# Patient Record
Sex: Male | Born: 1955 | Race: White | Hispanic: No | Smoking: Former smoker
Health system: Southern US, Community
[De-identification: ages and names within clinical notes are randomized; demographics above are authoritative.]

## PROBLEM LIST (undated history)

## (undated) DIAGNOSIS — J189 Pneumonia, unspecified organism: Secondary | ICD-10-CM

## (undated) DIAGNOSIS — R519 Headache, unspecified: Secondary | ICD-10-CM

## (undated) DIAGNOSIS — E785 Hyperlipidemia, unspecified: Secondary | ICD-10-CM

## (undated) DIAGNOSIS — N200 Calculus of kidney: Secondary | ICD-10-CM

## (undated) DIAGNOSIS — Z86718 Personal history of other venous thrombosis and embolism: Secondary | ICD-10-CM

## (undated) DIAGNOSIS — L97509 Non-pressure chronic ulcer of other part of unspecified foot with unspecified severity: Secondary | ICD-10-CM

## (undated) DIAGNOSIS — D649 Anemia, unspecified: Secondary | ICD-10-CM

## (undated) DIAGNOSIS — F32A Depression, unspecified: Secondary | ICD-10-CM

## (undated) DIAGNOSIS — G8929 Other chronic pain: Secondary | ICD-10-CM

## (undated) DIAGNOSIS — J45909 Unspecified asthma, uncomplicated: Secondary | ICD-10-CM

## (undated) DIAGNOSIS — G629 Polyneuropathy, unspecified: Secondary | ICD-10-CM

## (undated) DIAGNOSIS — Z9981 Dependence on supplemental oxygen: Secondary | ICD-10-CM

## (undated) DIAGNOSIS — K219 Gastro-esophageal reflux disease without esophagitis: Secondary | ICD-10-CM

## (undated) DIAGNOSIS — I10 Essential (primary) hypertension: Secondary | ICD-10-CM

## (undated) DIAGNOSIS — M109 Gout, unspecified: Secondary | ICD-10-CM

## (undated) DIAGNOSIS — C801 Malignant (primary) neoplasm, unspecified: Secondary | ICD-10-CM

## (undated) DIAGNOSIS — G4730 Sleep apnea, unspecified: Secondary | ICD-10-CM

## (undated) DIAGNOSIS — F329 Major depressive disorder, single episode, unspecified: Secondary | ICD-10-CM

## (undated) DIAGNOSIS — M549 Dorsalgia, unspecified: Secondary | ICD-10-CM

## (undated) DIAGNOSIS — E119 Type 2 diabetes mellitus without complications: Secondary | ICD-10-CM

## (undated) DIAGNOSIS — R0600 Dyspnea, unspecified: Secondary | ICD-10-CM

## (undated) DIAGNOSIS — M1990 Unspecified osteoarthritis, unspecified site: Secondary | ICD-10-CM

## (undated) DIAGNOSIS — J449 Chronic obstructive pulmonary disease, unspecified: Secondary | ICD-10-CM

## (undated) DIAGNOSIS — Z87442 Personal history of urinary calculi: Secondary | ICD-10-CM

## (undated) DIAGNOSIS — R51 Headache: Secondary | ICD-10-CM

## (undated) DIAGNOSIS — F419 Anxiety disorder, unspecified: Secondary | ICD-10-CM

## (undated) HISTORY — PX: DG FEET 2 VIEWS BILAT: HXRAD267

## (undated) HISTORY — PX: APPENDECTOMY: SHX54

## (undated) HISTORY — PX: OTHER SURGICAL HISTORY: SHX169

---

## 2004-10-29 DIAGNOSIS — Z729 Problem related to lifestyle, unspecified: Secondary | ICD-10-CM | POA: Insufficient documentation

## 2006-01-04 ENCOUNTER — Emergency Department: Payer: Self-pay | Admitting: Emergency Medicine

## 2006-01-04 ENCOUNTER — Other Ambulatory Visit: Payer: Self-pay

## 2006-01-07 ENCOUNTER — Other Ambulatory Visit: Payer: Self-pay

## 2006-01-07 ENCOUNTER — Emergency Department: Payer: Self-pay | Admitting: Emergency Medicine

## 2006-02-13 ENCOUNTER — Other Ambulatory Visit: Payer: Self-pay

## 2006-02-13 ENCOUNTER — Emergency Department: Payer: Self-pay | Admitting: Emergency Medicine

## 2006-04-04 ENCOUNTER — Ambulatory Visit: Payer: Self-pay | Admitting: Podiatry

## 2006-09-19 ENCOUNTER — Encounter: Admission: RE | Admit: 2006-09-19 | Discharge: 2006-09-19 | Payer: Self-pay | Admitting: Orthopedic Surgery

## 2006-11-18 ENCOUNTER — Inpatient Hospital Stay (HOSPITAL_COMMUNITY): Admission: RE | Admit: 2006-11-18 | Discharge: 2006-11-19 | Payer: Self-pay | Admitting: Orthopedic Surgery

## 2007-02-04 ENCOUNTER — Encounter: Admission: RE | Admit: 2007-02-04 | Discharge: 2007-02-04 | Payer: Self-pay | Admitting: Orthopedic Surgery

## 2008-01-12 ENCOUNTER — Emergency Department: Payer: Self-pay | Admitting: Emergency Medicine

## 2009-02-22 ENCOUNTER — Emergency Department (HOSPITAL_COMMUNITY): Admission: EM | Admit: 2009-02-22 | Discharge: 2009-02-22 | Payer: Self-pay | Admitting: Emergency Medicine

## 2009-08-04 ENCOUNTER — Emergency Department: Payer: Self-pay | Admitting: Emergency Medicine

## 2009-09-16 ENCOUNTER — Emergency Department: Payer: Self-pay | Admitting: Unknown Physician Specialty

## 2010-07-10 DIAGNOSIS — M109 Gout, unspecified: Secondary | ICD-10-CM | POA: Insufficient documentation

## 2010-11-23 DIAGNOSIS — M545 Low back pain, unspecified: Secondary | ICD-10-CM | POA: Insufficient documentation

## 2011-04-26 NOTE — Op Note (Signed)
Christian Fields, Christian Fields NO.:  [id number]   MEDICAL RECORD NO.:  [id number]          PATIENT TYPE:  INP   LOCATION:  2899                         FACILITY:  [facility]   PHYSICIAN:  Doralee Albino. Carola Frost, M.D. DATE OF BIRTH:  11/27/1956   DATE OF PROCEDURE:  11/18/2006  DATE OF DISCHARGE:                               OPERATIVE REPORT   DIAGNOSIS:  Right talus nonunion.   POSTOPERATIVE DIAGNOSIS:  Right talus nonunion.   PROCEDURE:  1. Open treatment of right talus fracture nonunion  2. Iliac crest autografting.   SURGEON:  Myrene Galas, MD   ANESTHESIA:  General.   SPECIMENS:  None.   ESTIMATED BLOOD LOSS:  100 mL.   COMPLICATIONS:  None.   DISPOSITION:  To PACU.   CONDITION:  Stable.   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  With have discussed the risks and benefits of surgery  including possibility of infection, failure of this nonunion repair,  need for further surgery including fusion, DVT, PE, and other concerns.  We also discussed complications related to iliac crest bone grafting  including pain, lateral leg numbness, and other concerns.  After a full  discussion, the patient wished to proceed.   DESCRIPTION OF PROCEDURE:  Christian Fields was taken to the operating room  after administration of preoperative antibiotics.  General anesthesia  was induced.  His right lower extremity was then prepped, draped in the  usual sterile fashion with a tourniquet about the thigh; however, it was  not inflated during the case.  Separately, his right iliac crest was  prepped and draped in the usual sterile fashion with a bump under the  right pelvic ring.  A 5 cm incision centered over the proximal aspect of  the medial malleolus was then made, the saphenous vein retracted  anteriorly, periosteum exposed, and a K-wire used to determine the exact  site of osteotomy.  This was then made with the oscillating saw using a  Chevron-type of bone cut.  Prior to making the cut, we did go ahead and  drill 2 screw holes for placement of the medial malleolar screws.  The  subchondral bone was then cracked with an osteotome to complete the  osteotomy.  We were then able to visualize the articular surface quite  clearly and identify the fracture site.  It was developed with a 15  blade being careful to limit injury to the articular surface.  Once  entering the fracture site, there was a large cavity directly under the  scar cartilage in the medial dome.  This area was curetted back to  bleeding, healthy bone.  We then continued to trace out the fracture  site working with small osteotomes to clear the area of nonunion.  Once  we had traced all this out, there were some small spicules communicating  from the anterior to posterior body which appeared to have united.  These were small.  The plan was  to take them down and obtain better  compression across the subtalar surface.  However, these were quite  sizable and after further debridement, there remained absolutely no  motion at the fracture site given that his contour and overall alignment  was good.  There was some risk in mobilizing these fractures entirely.  Furthermore, the nonunion site was somewhat serpentine and even with a  debridement, there would be large cavities requiring iliac crest bone  grafting.  Consequently, we accepted what union was there and chose  instead to use the iliac crest to pack the all the voids.   Attention was then turned to the iliac crest where a 4 cm incision was  made just superior to the wing.  We then stayed in the avascular plane  down to the crest and elevated the periosteum with the Bovie, made a  trap door, and then collected large quantity of iliac crest using large  strips.  This was then taken down to the ankle where the nonunion site  was irrigated  thoroughly and a small 1.5 drill used to make several  passes into both the anterior body as well as the posterior talar body.  We then packed the bone graft being careful to limit extravasation on  the subtalar side.  This resulted in an outstanding fill of the defect  with no loose fragments on the tibiotalar side.  The joint was then  irrigated and examined for debris and then the medial malleolar  osteotomy replaced, fixing it with two 40 mm partially-threaded 3.5  screws.  We then flipped down the trapdoor at the iliac crest.  We did  place some Gelfoam, and this was removed prior to closure.  I irrigated  this and closed it in standard layered fashion with 0 Vicryl for the  periosteal layer and deep layer and a 2-0 Vicryl and 3-0 nylon for the  skin.  Similarly distally, we performed a layered closure with 0 Vicryl  for the periosteum, 2-0 for the subcu, and 3-0 nylon for the skin.  Sterile gently compressive dressings were applied and then a posterior  and stirrup splint.  The patient was taken to the PACU in stable  condition.   PROGNOSIS:  Christian Fields had sufficient bone stability, did not require an  internal fixation which certainly will have benefit later in terms of  fusion should other procedures be required.  However, it appears at this  point, he is able to maintain smoking cessation and his weightbearing  restrictions that he should go on to unite this fracture.  Plan will be  to place him on Lovenox for DVT prophylaxis while in the hospital,  discharge him on baby aspirin daily, and then allow him unrestricted  flexion and extension within a week of surgery while, of course,  maintaining non-weightbearing.      Doralee Albino. Carola Frost, M.D.  Electronically Signed     [facility]/MEDQ  D:  11/18/2006  T:  11/18/2006  Job:  [id number]

## 2011-06-26 ENCOUNTER — Ambulatory Visit: Payer: Self-pay | Admitting: Pain Medicine

## 2012-06-28 ENCOUNTER — Ambulatory Visit: Payer: Self-pay | Admitting: Internal Medicine

## 2012-07-04 ENCOUNTER — Emergency Department: Payer: Self-pay | Admitting: Emergency Medicine

## 2012-10-02 ENCOUNTER — Ambulatory Visit: Payer: Self-pay

## 2012-10-21 ENCOUNTER — Ambulatory Visit: Payer: Self-pay | Admitting: Family Medicine

## 2012-12-15 ENCOUNTER — Encounter: Payer: Self-pay | Admitting: Anesthesiology

## 2013-01-09 ENCOUNTER — Encounter: Payer: Self-pay | Admitting: Anesthesiology

## 2013-01-18 DIAGNOSIS — F329 Major depressive disorder, single episode, unspecified: Secondary | ICD-10-CM | POA: Insufficient documentation

## 2013-01-18 DIAGNOSIS — G609 Hereditary and idiopathic neuropathy, unspecified: Secondary | ICD-10-CM | POA: Insufficient documentation

## 2013-01-18 DIAGNOSIS — G4730 Sleep apnea, unspecified: Secondary | ICD-10-CM | POA: Insufficient documentation

## 2013-01-18 DIAGNOSIS — J449 Chronic obstructive pulmonary disease, unspecified: Secondary | ICD-10-CM | POA: Insufficient documentation

## 2013-03-24 ENCOUNTER — Emergency Department: Payer: Self-pay | Admitting: Emergency Medicine

## 2013-03-24 ENCOUNTER — Ambulatory Visit: Payer: Self-pay | Admitting: Family Medicine

## 2013-03-24 LAB — CBC
HCT: 44.9 % (ref 40.0–52.0)
HGB: 15.5 g/dL (ref 13.0–18.0)
MCH: 32.8 pg (ref 26.0–34.0)
MCHC: 34.6 g/dL (ref 32.0–36.0)
MCV: 95 fL (ref 80–100)
Platelet: 271 10*3/uL (ref 150–440)
RBC: 4.73 10*6/uL (ref 4.40–5.90)
RDW: 13.2 % (ref 11.5–14.5)
WBC: 14.1 10*3/uL — ABNORMAL HIGH (ref 3.8–10.6)

## 2013-03-24 LAB — COMPREHENSIVE METABOLIC PANEL
Albumin: 3.7 g/dL (ref 3.4–5.0)
Alkaline Phosphatase: 143 U/L — ABNORMAL HIGH (ref 50–136)
Anion Gap: 8 (ref 7–16)
BUN: 19 mg/dL — ABNORMAL HIGH (ref 7–18)
Bilirubin,Total: 0.4 mg/dL (ref 0.2–1.0)
Calcium, Total: 9.3 mg/dL (ref 8.5–10.1)
Chloride: 100 mmol/L (ref 98–107)
Co2: 28 mmol/L (ref 21–32)
Creatinine: 0.79 mg/dL (ref 0.60–1.30)
EGFR (African American): >60
EGFR (Non-African Amer.): >60
Glucose: 113 mg/dL — ABNORMAL HIGH (ref 65–99)
Osmolality: 275 (ref 275–301)
Potassium: 3.2 mmol/L — ABNORMAL LOW (ref 3.5–5.1)
SGOT(AST): 18 U/L (ref 15–37)
SGPT (ALT): 27 U/L (ref 12–78)
Sodium: 136 mmol/L (ref 136–145)
Total Protein: 7.3 g/dL (ref 6.4–8.2)

## 2013-03-24 LAB — URINALYSIS, COMPLETE
Bacteria: NEGATIVE
Bacteria: NONE SEEN
Bilirubin,UR: NEGATIVE
Bilirubin,UR: NEGATIVE
Blood: NEGATIVE
Blood: NEGATIVE
Glucose,UR: NEGATIVE mg/dL (ref 0–75)
Glucose,UR: NEGATIVE mg/dL (ref 0–75)
Ketone: NEGATIVE
Ketone: NEGATIVE
Leukocyte Esterase: NEGATIVE
Leukocyte Esterase: NEGATIVE
Nitrite: NEGATIVE
Nitrite: NEGATIVE
Ph: 6 (ref 4.5–8.0)
Ph: 5 (ref 4.5–8.0)
Protein: NEGATIVE
Protein: NEGATIVE
RBC,UR: <1 /HPF (ref 0–5)
Specific Gravity: 1.02 (ref 1.003–1.030)
Specific Gravity: 1.02 (ref 1.003–1.030)
Squamous Epithelial: NONE SEEN
WBC UR: 1 /HPF (ref 0–5)

## 2013-03-24 LAB — TROPONIN I: Troponin-I: <0.02 ng/mL

## 2013-04-09 ENCOUNTER — Ambulatory Visit: Payer: Self-pay | Admitting: Family Medicine

## 2013-09-24 ENCOUNTER — Inpatient Hospital Stay: Payer: Self-pay | Admitting: Internal Medicine

## 2013-09-24 LAB — COMPREHENSIVE METABOLIC PANEL
Albumin: 3.6 g/dL (ref 3.4–5.0)
Alkaline Phosphatase: 128 U/L (ref 50–136)
Anion Gap: 8 (ref 7–16)
BUN: 10 mg/dL (ref 7–18)
Bilirubin,Total: 0.4 mg/dL (ref 0.2–1.0)
Calcium, Total: 9.3 mg/dL (ref 8.5–10.1)
Chloride: 97 mmol/L — ABNORMAL LOW (ref 98–107)
Co2: 30 mmol/L (ref 21–32)
Creatinine: 0.73 mg/dL (ref 0.60–1.30)
EGFR (African American): >60
EGFR (Non-African Amer.): >60
Glucose: 164 mg/dL — ABNORMAL HIGH (ref 65–99)
Osmolality: 273 (ref 275–301)
Potassium: 3 mmol/L — ABNORMAL LOW (ref 3.5–5.1)
SGOT(AST): 38 U/L — ABNORMAL HIGH (ref 15–37)
SGPT (ALT): 36 U/L (ref 12–78)
Sodium: 135 mmol/L — ABNORMAL LOW (ref 136–145)
Total Protein: 7.1 g/dL (ref 6.4–8.2)

## 2013-09-24 LAB — CBC
HCT: 42.5 % (ref 40.0–52.0)
HGB: 14.5 g/dL (ref 13.0–18.0)
MCH: 32.1 pg (ref 26.0–34.0)
MCHC: 34.2 g/dL (ref 32.0–36.0)
MCV: 94 fL (ref 80–100)
Platelet: 234 10*3/uL (ref 150–440)
RBC: 4.54 10*6/uL (ref 4.40–5.90)
RDW: 13.4 % (ref 11.5–14.5)
WBC: 8.8 10*3/uL (ref 3.8–10.6)

## 2013-09-24 LAB — PROTIME-INR
INR: 1
Prothrombin Time: 13 secs (ref 11.5–14.7)

## 2013-09-24 LAB — CK TOTAL AND CKMB (NOT AT ARMC)
CK, Total: 179 U/L (ref 35–232)
CK-MB: 4.4 ng/mL — ABNORMAL HIGH (ref 0.5–3.6)

## 2013-09-24 LAB — TROPONIN I: Troponin-I: <0.02 ng/mL

## 2013-09-24 LAB — APTT: Activated PTT: 30.6 secs (ref 23.6–35.9)

## 2013-09-25 LAB — LIPID PANEL
Cholesterol: 223 mg/dL — ABNORMAL HIGH (ref 0–200)
HDL Cholesterol: 57 mg/dL (ref 40–60)
Ldl Cholesterol, Calc: 146 mg/dL — ABNORMAL HIGH (ref 0–100)
Triglycerides: 99 mg/dL (ref 0–200)
VLDL Cholesterol, Calc: 20 mg/dL (ref 5–40)

## 2013-09-25 LAB — CBC WITH DIFFERENTIAL/PLATELET
Basophil #: 0 10*3/uL (ref 0.0–0.1)
Basophil %: 0.4 %
Eosinophil #: 0 10*3/uL (ref 0.0–0.7)
Eosinophil %: 0.1 %
HCT: 44.5 % (ref 40.0–52.0)
HGB: 15.1 g/dL (ref 13.0–18.0)
Lymphocyte #: 0.6 10*3/uL — ABNORMAL LOW (ref 1.0–3.6)
Lymphocyte %: 6.7 %
MCH: 32.1 pg (ref 26.0–34.0)
MCHC: 33.9 g/dL (ref 32.0–36.0)
MCV: 95 fL (ref 80–100)
Monocyte #: 0.1 x10 3/mm — ABNORMAL LOW (ref 0.2–1.0)
Monocyte %: 1 %
Neutrophil #: 8.9 10*3/uL — ABNORMAL HIGH (ref 1.4–6.5)
Neutrophil %: 91.8 %
Platelet: 241 10*3/uL (ref 150–440)
RBC: 4.69 10*6/uL (ref 4.40–5.90)
RDW: 13 % (ref 11.5–14.5)
WBC: 9.7 10*3/uL (ref 3.8–10.6)

## 2013-09-25 LAB — CK TOTAL AND CKMB (NOT AT ARMC)
CK, Total: 113 U/L (ref 35–232)
CK, Total: 76 U/L (ref 35–232)
CK-MB: 3.1 ng/mL (ref 0.5–3.6)
CK-MB: 2.3 ng/mL (ref 0.5–3.6)

## 2013-09-25 LAB — BASIC METABOLIC PANEL
Anion Gap: 5 — ABNORMAL LOW (ref 7–16)
BUN: 12 mg/dL (ref 7–18)
Calcium, Total: 9.7 mg/dL (ref 8.5–10.1)
Chloride: 97 mmol/L — ABNORMAL LOW (ref 98–107)
Co2: 35 mmol/L — ABNORMAL HIGH (ref 21–32)
Creatinine: 0.86 mg/dL (ref 0.60–1.30)
EGFR (African American): >60
EGFR (Non-African Amer.): >60
Glucose: 178 mg/dL — ABNORMAL HIGH (ref 65–99)
Osmolality: 278 (ref 275–301)
Potassium: 3.5 mmol/L (ref 3.5–5.1)
Sodium: 137 mmol/L (ref 136–145)

## 2013-09-25 LAB — TROPONIN I
Troponin-I: <0.02 ng/mL
Troponin-I: <0.02 ng/mL

## 2013-09-25 LAB — HEMOGLOBIN A1C: Hemoglobin A1C: 6.3 % (ref 4.2–6.3)

## 2014-03-08 ENCOUNTER — Ambulatory Visit: Payer: Self-pay | Admitting: Gastroenterology

## 2014-03-10 LAB — PATHOLOGY REPORT

## 2014-03-29 ENCOUNTER — Ambulatory Visit: Payer: Self-pay | Admitting: General Practice

## 2014-03-29 ENCOUNTER — Ambulatory Visit: Payer: Self-pay | Admitting: Emergency Medicine

## 2014-03-31 ENCOUNTER — Emergency Department: Payer: Self-pay | Admitting: Emergency Medicine

## 2014-03-31 LAB — BASIC METABOLIC PANEL
Anion Gap: 6 — ABNORMAL LOW (ref 7–16)
BUN: 13 mg/dL (ref 7–18)
Calcium, Total: 9.3 mg/dL (ref 8.5–10.1)
Chloride: 100 mmol/L (ref 98–107)
Co2: 30 mmol/L (ref 21–32)
Creatinine: 1.05 mg/dL (ref 0.60–1.30)
EGFR (African American): >60
EGFR (Non-African Amer.): >60
Glucose: 180 mg/dL — ABNORMAL HIGH (ref 65–99)
Osmolality: 277 (ref 275–301)
Potassium: 3.7 mmol/L (ref 3.5–5.1)
Sodium: 136 mmol/L (ref 136–145)

## 2014-03-31 LAB — CBC
HCT: 44.6 % (ref 40.0–52.0)
HGB: 14.3 g/dL (ref 13.0–18.0)
MCH: 30.7 pg (ref 26.0–34.0)
MCHC: 32.1 g/dL (ref 32.0–36.0)
MCV: 96 fL (ref 80–100)
Platelet: 261 10*3/uL (ref 150–440)
RBC: 4.66 10*6/uL (ref 4.40–5.90)
RDW: 13.7 % (ref 11.5–14.5)
WBC: 9.1 10*3/uL (ref 3.8–10.6)

## 2014-05-05 ENCOUNTER — Ambulatory Visit: Payer: Self-pay | Admitting: Emergency Medicine

## 2014-05-11 ENCOUNTER — Emergency Department: Payer: Self-pay | Admitting: Emergency Medicine

## 2014-05-17 ENCOUNTER — Ambulatory Visit: Payer: Self-pay | Admitting: Podiatry

## 2014-05-17 LAB — URIC ACID: Uric Acid: 4.4 mg/dL (ref 3.5–7.2)

## 2014-05-17 LAB — HEMOGLOBIN: HGB: 13.9 g/dL (ref 13.0–18.0)

## 2014-05-17 LAB — POTASSIUM: Potassium: 3.5 mmol/L (ref 3.5–5.1)

## 2014-05-17 LAB — GLUCOSE, RANDOM: Glucose: 91 mg/dL (ref 65–99)

## 2014-05-19 ENCOUNTER — Ambulatory Visit: Payer: Self-pay | Admitting: Podiatry

## 2015-03-31 NOTE — H&P (Signed)
PATIENT NAME:  Christian Fields, Christian Fields MR#:  [id number] DATE OF BIRTH:  03-08-1956  REFERRING PHYSICIAN: Lenise Arena, MD.  PRIMARY CARE PHYSICIAN: Dr. Clide Deutscher.   CHIEF COMPLAINT: Chest pain.   HISTORY OF PRESENT ILLNESS: The patient is a 59 year old Caucasian male with history of COPD, on nocturnal oxygen, hypertension, anxiety, and neuropathy. He came in for chest pain. He stated that the chest pain started today, midsternal, does not radiate, but the patient did have some nausea, diaphoresis and shortness of breath with this. EMS was called and he did receive some Zofran as well as an aspirin. He does have history of COPD as well but is a current smoker. Currently he has no chest pain. The pain has resolved.   On arrival he was noted to have hypoxemia with an O2 saturation of 85% on room air. Of note, he is not on oxygen during the day. He does not have any cough. He did have mild elevation of CK-MB but negative troponin, and hospitalist services were contacted for further evaluation and management.   PAST MEDICAL HISTORY: History of hypertension, COPD, hyperlipidemia, anxiety, neuropathy, chronic back pain, chronic respiratory failure with oxygen nocturnally.   PAST SURGICAL HISTORY: Denies.   FAMILY HISTORY: Hypertension in the mother.   SOCIAL HISTORY: Still smokes 1-1/2 packs a day. Occasional alcohol. No drug use. 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   OUTPATIENT MEDICATIONS: Gabapentin 600 mg three times a day, aspirin 81 mg daily, simvastatin 40 mg daily, Advair 250/50 inhaled one puff 2 times a day, Spiriva 18 mcg inhaled daily, duloxetine 30 mg daily.   REVIEW OF SYSTEMS: CONSTITUTIONAL: No fever, fatigue or weakness. No weight changes.  EYES: No blurry vision or double vision.  ENT: No tinnitus or hearing loss. No snoring.  RESPIRATORY: Denies cough. Has shortness of breath and nocturnal wheezing as well. He has some wheezing today and some shortness of breath. No painful respirations.   CARDIOVASCULAR: Chest pain as above. No orthopnea or swelling in the legs. No  arrhythmia. Has hypertension. No history of MI or CHF.  GASTROINTESTINAL: Did not have nausea prior. No nausea or vomiting. No abdominal pain now. No hematemesis.  GENITOURINARY: Denies dysuria, hematuria.  HEMOLYMPHATIC: No anemia or easy bruising.  SKIN: No rashes.  MUSCULOSKELETAL: Denies gout. Has chronic back pain.  NEUROLOGIC: Denies focal weakness or numbness.  PSYCHIATRIC: Has anxiety and depression.   PHYSICAL EXAMINATION:  VITAL SIGNS: Temperature on arrival 98, pulse rate 106, respiratory rate 20, blood pressure 98/62. Oxygen saturation was 85% on room air. Currently he is on oxygen and O2 saturations are in the 90's.  GENERAL: Unkempt Caucasian male sitting in bed, talking in full sentences, but he is falling asleep when I am asking him questions and during the exam, and he is arousable though. HEENT: Normocephalic, atraumatic. Pupils are equal and reactive. Anicteric sclerae. Extraocular muscles intact. Moist mucous membranes.  NECK: Supple. No thyroid tenderness. No cervical lymphadenopathy.  CARDIOVASCULAR: S1, S2. Regular rate and rhythm. No significant murmurs appreciated.  LUNGS: Bilateral wheezing in all lung fields. No crackles.  ABDOMEN: Soft, nontender, nondistended. Positive bowel sounds in all quadrants. No organomegaly appreciated.  SKIN: No rashes.  EXTREMITIES: No significant lower extremity edema.  NEUROLOGIC: Cranial nerves II through XII grossly intact. Strength is five out of five in all extremities. Sensation is intact to light touch.  PSYCHIATRIC: Awake, alert, oriented x3.   LABORATORY AND IMAGING DATA: EKG shows sinus tach. Rate is 103. There are Q waves in V1,  V2, V3 which appear to be old. No acute ST elevations or depressions.   X-ray of the chest shows COPD and also hyperinflation.   BUN 10, creatinine 0.73, sodium 135, potassium 3, chloride 97. LFTs show AST of 38 but   otherwise within normal limits. CK-MB is 4.4. Troponin and CK total negative. CBC within normal limits. INR is 1.   ASSESSMENT AND PLAN: We have a 59 year old Caucasian male with history of chronic obstructive pulmonary disease on nocturnal oxygen, chronic respiratory failure, not on oxygen during the day, however, who comes in for pains in the chest with typical features, diaphoresis, nausea, however, without radiation. This is concerning for unstable angina. Furthermore, the patient does have acute-on-chronic respiratory failure likely secondary to chronic obstructive pulmonary disease and does smoke still. At this point, would admit the patient to the hospital.   In regards to the chest pain, the patient does have multiple risk factors. Is ongoing tobacco abuser. Has hypertension, hyperlipidemia, and at this point, unstable angina is a possibility. He has no pains in the chest now, however. Would admit to telemetry, cycle the troponins and CK-MB, start aspirin, statin, start a low-dose beta blocker as well. We would start Nitro patch and  morphine p.r.n. We will see how he does, obtain an echocardiogram, and given the respiratory issues, would defer a stress test until the patient is better, as he has no active chest pains now. We would consider a cardiology consult if the troponins turn up positive.   In regards to the acute-on-chronic respiratory failure, this is likely secondary to chronic obstructive pulmonary disease. We would start him on IV steroids, nebulizers around the clock, continue his Spiriva. We would also put him on Levaquin.   X-ray of the chest is negative for pneumonia. He was counseled for 3 minutes about smoking cessation. He states that he wants to quit and he is using Chantix as an outpatient.   He does have mild hypokalemia, which I will go ahead and replete. Continue his gabapentin at this point. We would follow him clinically. He has a somewhat low blood pressure at this point,  and I would go ahead and hold the hydrochlorothiazide. Would start him on a proton pump inhibitor for gastrointestinal prophylaxis and deep vein thrombosis prophylaxis as well.   TOTAL TIME SPENT: 50 minutes.   CODE STATUS: FULL CODE.    ____________________________ Vivien Presto, MD sa:np D: 09/24/2013 21:19:39 ET T: 09/24/2013 22:29:35 ET JOB#: [id number]  cc: Vivien Presto, MD, <Dictator> Vivien Presto MD ELECTRONICALLY SIGNED 10/28/2013 1:48

## 2015-03-31 NOTE — Discharge Summary (Signed)
PATIENT NAME:  Christian Fields, Christian Fields MR#:  [id number] DATE OF BIRTH:  25-Dec-1955  DATE OF ADMISSION:  09/24/2013 DATE OF DISCHARGE:  09/27/2013  ADMISSION DIAGNOSES:  1. Acute on chronic respiratory failure.  2. Chest pain.   DISCHARGE DIAGNOSES:  1. Acute on chronic respiratory failure due to COPD exacerbation  2. Chest pain secondary to problem #1.  3. Hyperlipidemia.   CONSULTATIONS: None.   NUCLEAR MEDICINE STRESS TEST: Negative for ischemia.   ECHOCARDIOGRAM: Showed normal ejection fraction. He does have diastolic dysfunction with impaired LV relaxation.   HOSPITAL COURSE: This 59 year old male presented with acute on chronic respiratory failure. For further details, please refer to the H and P.  1. Acute on chronic respiratory failure secondary to acute COPD exacerbation. The patient was placed on IV steroids, Levaquin and oxygen, as well as inhalers. He actually quite well. He does require oxygen on exertion. He is saturating 85% due to his chronic respiratory failure. He has no crackles or wheezing at discharge. His respiratory failure has improved from admission.  2. Chest pain secondary to COPD exacerbation flare rather than true angina. His stress test was negative for acute ischemia. Troponins are negative. Telemetry is negative. His echo did show impaired relaxation consistent with diastolic dysfunction. He had a normal EF.   3. Tobacco abuse. The patient says he wants to quit smoking; however, he was found outside smoking. He is on Chantix, and we discussed in great detail about stopping smoking and not using Chantix while smoking.   DISCHARGE MEDICATIONS:  1. Simvastatin 40 mg at bedtime.  2. HCTZ 25 mg daily.  3. Advair Diskus 250/50 b.i.d.  4. Aspirin 81 mg daily.  5. Gabapentin 600 mg 2 times a day and 1-1/2 tablets at bedtime.  6. Clonazepam 1 mg t.i.d.  7. Spiriva 18 mcg daily.  8. Cetirizine 10 mg daily.  9. Duloxetine 30 mg daily.  10. Vitamin D3 1000 international  units daily.  11. Vitamin B12 500 mcg daily.  12. Ginseng 1 tablet daily.  13. Fluticasone nasal spray 2 sprays daily.  14. Pataday 0.2 ophthalmic solution to each affected eye daily.  15. Prednisone taper starting at 60 mg taper by 10 mg every 2 days.  16. Varenicline 1 mg daily.  17. Levaquin 500 mg q.24 hours for 3 days.   DISCHARGE OXYGEN: Two liters nasal cannula for COPD chronic.   DISCHARGE DIET: Low sodium, regular consistency.   DISCHARGE FOLLOWUP: The patient will follow up with Dr. Ubaldo Fields in 2 weeks, as well as Dr. Devona Konig. He will follow up with Dr. Clide Fields tomorrow.    TIME SPENT: Approximately 35 minutes.   The patient is medically stable for discharge.   ____________________________ Priscella Donna P. Benjie Karvonen, MD spm:gb D: 09/27/2013 16:30:24 ET T: 09/27/2013 19:55:41 ET JOB#: [id number]  cc: Christian Fantroy P. Benjie Karvonen, MD, <Dictator> Christian Gee, MD Christian Docker Ubaldo Glassing, MD Christian A. Clide Deutscher, MD   [name] MD ELECTRONICALLY SIGNED 09/27/2013 20:59

## 2015-04-01 NOTE — Op Note (Signed)
PATIENT NAME:  Christian Fields, Christian Fields MR#:  [id number] DATE OF BIRTH:  08-03-1956  DATE OF PROCEDURE:  05/19/2014  PREOPERATIVE DIAGNOSIS: Displaced navicular fracture, comminuted, left foot.   POSTOPERATIVE DIAGNOSIS: Displaced navicular fracture, comminuted, left foot.   PROCEDURE: Open reduction internal fixation navicular fracture, left foot.   SURGEON: Gerrit Heck. Caetano Oberhaus, DPM  ASSISTANT: None.   HISTORY OF PRESENT ILLNESS: The patient had an injury last week where he fell into a hole. It was noted that he had a comminuted navicular fracture. He has been somewhat noncompliant on it, has been walking around on it some, so it has displaced some more since time of his fracture.   ANESTHESIA: Popliteal block with sedation.   ANESTHESIOLOGIST: Dr. Roseanne Kaufman BLOOD LOSS: 75 mL.   HEMOSTASIS: Thigh tourniquet at 300 mmHg.   OPERATIVE REPORT: The patient was brought to the OR and placed on the OR table in the supine position. At this point, after general anesthesia was achieved, the patient was then prepped and draped in the usual sterile manner. Popliteal block had been achieved in the holding area earlier. At this time, attention was directed to the dorsum of the left foot where a 7 cm dorsal linear incision was made over the talonavicular and navicular cuneiform joint. This was deepened with sharp and blunt dissection, bleeders clamped and bovied as required. Vital structures were retracted medially and laterally. The tibialis anterior tendon was identified and retracted laterally and periosteum over the above joints were then incised in a longitudinal fashion. This was then freed away from the medial and dorsal navicular. There was noted fracture fragments and noted comminution to the navicular at this time frame. A separate piece was dorsal and comminuted fragments were present dorsomedially and lateral plantarly with multiple fracture lines. A separate incision was made lateral to the dorsalis  pedis region. This was deepened with sharp and blunt dissection, bleeders clamped and bovied as required. Vital structures were retracted medially and laterally. This was carried down to the area of the lateral navicular and periosteal tissue was freed away from this region until both incisions were joined and full observation of the navicular was achieved. Multiple comminuted portions were noted. At this point, the area was temporarily fixated with bone clamp and K wires. A plate from [employer] system and navicular plate was then utilized to mold around the region and a template was utilized initially in order to get the correct sizing and bend and then the plate was manipulated on the table. Once this was in position, a couple of compression screws were placed across the area once the plate was in ready location, 1 medially and 1 laterally. This was followed by multiple locking screws. The area was checked with FluoroScan and good position and correction was noted. There was a dorsal medial fragment, fairly large, that was problematic and intra-articular and a 2.7 screw was placed across this area for fixation. The area was then copiously irrigated along both wounds. Periosteal tissue was closed laterally with 3-0 Vicryl continuous sutures. Once this aspect of it was completed, the tourniquet was released and prompt and complete vascularity was seen to return to the digits of the right foot. No pulsatile flow was noted along either incision margin. The remaining lateral incision was closed, deep and superficial fascia layers and ligamentous tissues were all closed. The retinaculum was closed also with a continuous 3-0 Vicryl stitch.   At this time, the medial periosteum capsular tissue was closed with 3-0  Vicryl in a combination of simple interrupted and continuous stitch. Retinaculum were closed in a similar fashion as were deep and superficial fascia. Skin at this time to both the areas was closed with skin  staples. A drain was placed in the medial incision prior to closure. A sterile compressive dressing was placed across the wounds at this time consisting of Xeroform gauze, 4 x 4's, Kling, Kerlix and ABD pads. A posterior splint was placed on the left foot and leg in the operating room. The patient appeared to tolerate the procedure and anesthesia well and left the OR for the recovery room with vital signs stable and neurovascular status intact. ____________________________ Gerrit Heck. Hameed Kolar, DPM mgt:sb D: 05/19/2014 13:15:14 ET T: 05/19/2014 13:53:36 ET JOB#: [id number]  cc: Gerrit Heck Mayley Lish, DPM, <Dictator> Perry Mount MD ELECTRONICALLY SIGNED 05/26/2014 7:58

## 2015-08-24 ENCOUNTER — Ambulatory Visit
Admission: EM | Admit: 2015-08-24 | Discharge: 2015-08-24 | Disposition: A | Discharge status: Home / Self Care | Payer: Medicare Other | Attending: Family Medicine | Admitting: Family Medicine

## 2015-08-24 ENCOUNTER — Encounter: Payer: Self-pay | Admitting: Emergency Medicine

## 2015-08-24 DIAGNOSIS — J441 Chronic obstructive pulmonary disease with (acute) exacerbation: Secondary | ICD-10-CM

## 2015-08-24 DIAGNOSIS — Z720 Tobacco use: Secondary | ICD-10-CM | POA: Diagnosis not present

## 2015-08-24 DIAGNOSIS — H109 Unspecified conjunctivitis: Secondary | ICD-10-CM

## 2015-08-24 HISTORY — DX: Hyperlipidemia, unspecified: E78.5

## 2015-08-24 HISTORY — DX: Gout, unspecified: M10.9

## 2015-08-24 HISTORY — DX: Essential (primary) hypertension: I10

## 2015-08-24 MED ORDER — AMOXICILLIN-POT CLAVULANATE 875-125 MG PO TABS: 1.0000 | ORAL_TABLET | Freq: Two times a day (BID) | ORAL | Status: DC

## 2015-08-24 MED ORDER — PREDNISONE 10 MG (21) PO TBPK: ORAL_TABLET | ORAL | Status: DC

## 2015-08-24 NOTE — ED Provider Notes (Signed)
CSN: [id number]     Arrival date & time 08/24/15  1632 History   First MD Initiated Contact with Patient 08/24/15 1807     Chief Complaint  Patient presents with  . Eye Pain   patient's here because of left eye pain reports something he thought His eye but with further questioning he states that the feeling of something in his eyes comes and goes. This is been going on since 4 days. He does not know of anything that did get into his eye. His eye is irritated and red and because irritated and red he thinks is a male got into the eye.  After his eye was examined and HEENT was evaluated patient was told to plan to place him on anabiotic eyedrops and follow with ophthalmologist of choice things do not get better. As his discharge papers being written up patient informed the nurse if he got to tell me about his cough is congestion. Has a history of COPD and has been coughing up yellowish-green material since yesterday he still smokes despite being told not to smoke. Patient will also be evaluated now for exacerbation of COPD. Findings and history of both examinations will be emergent 1 note. (Consider location/radiation/quality/duration/timing/severity/associated sxs/prior Treatment) Patient is a 59 y.o. male presenting with eye pain. The history is provided by the patient. No language interpreter was used.  Eye Pain This is a new problem. Episode onset: Started Monday 4 days ago. The problem occurs constantly. Associated symptoms include shortness of breath. Pertinent negatives include no chest pain, no abdominal pain and no headaches. Nothing aggravates the symptoms. Nothing relieves the symptoms. He has tried a warm compress and water for the symptoms. The treatment provided no relief.    Past Medical History  Diagnosis Date  . Hypertension   . Hyperlipidemia   . Gout    Past Surgical History  Procedure Laterality Date  . Dg feet 2 views bilat    . Appendectomy     No family history on  file. Social History  Substance Use Topics  . Smoking status: Current Every Day Smoker  . Smokeless tobacco: Never Used  . Alcohol Use: Yes    Review of Systems  Eyes: Positive for pain.  Respiratory: Positive for shortness of breath and wheezing.   Cardiovascular: Negative for chest pain.  Gastrointestinal: Negative for abdominal pain.  Neurological: Negative for headaches.  All other systems reviewed and are negative.   Allergies  Review of patient's allergies indicates no known allergies.  Home Medications   Prior to Admission medications   Medication Sig Start Date End Date Taking? Authorizing Provider  amoxicillin-clavulanate (AUGMENTIN) 875-125 MG per tablet Take 1 tablet by mouth 2 (two) times daily. 08/24/15   Frederich Cha, MD  predniSONE (STERAPRED UNI-PAK 21 TAB) 10 MG (21) TBPK tablet Sig 6 tablet day 1, 5 tablets day 2, 4 tablets day 3,,3tablets day 4, 2 tablets day 5, 1 tablet day 6 take all tablets orally 08/24/15   Frederich Cha, MD   Meds Ordered and Administered this Visit  Medications - No data to display  BP 131/86 mmHg  Pulse 89  Temp(Src) 97 F (36.1 C) (Tympanic)  Resp 16  Ht '5\' 8"'$  (1.727 m)  Wt 162 lb (73.483 kg)  BMI 24.64 kg/m2  SpO2 90% No data found.   Physical Exam  Constitutional: He is oriented to person, place, and time.  Patient of is a chronically ill-appearing white male  HENT:  Head: Normocephalic and atraumatic.  Right Ear: External ear normal.  Left Ear: External ear normal.  Eyes: Pupils are equal, round, and reactive to light.  Neck: Neck supple.  Cardiovascular: Normal rate and regular rhythm.   Pulmonary/Chest: He has wheezes.  Musculoskeletal: Normal range of motion. He exhibits no edema or tenderness.  Neurological: He is alert and oriented to person, place, and time.  Skin: Skin is warm and dry.  Psychiatric: He has a normal mood and affect. His behavior is normal.  Vitals reviewed.   ED Course  Procedures (including  critical care time)  Labs Review Labs Reviewed - No data to display  Imaging Review No results found.   Visual Acuity Review  Right Eye Distance: 20/30 Left Eye Distance: 20/30 Bilateral Distance:    Right Eye Near:   Left Eye Near:    Bilateral Near:         MDM   1. Conjunctivitis, left eye   2. COPD with acute exacerbation   3. Tobacco abuse      We'll treat COPD with course of prednisone. Also add Augmentin 875 one tablet twice a day for the next 10 days. Patient course warned that he needs to stop smoking. Also because of your anabiotic's we'll place him on any eyedrops still with instructions if he is not better as far as his left eye is concerned he sees an ophthalmologist of his choice in the ED of his choice    Frederich Cha, MD 08/24/15 [phone]

## 2015-08-24 NOTE — ED Notes (Signed)
Left eye red and painful for 4 days

## 2015-08-24 NOTE — Discharge Instructions (Signed)
Bacterial Conjunctivitis Bacterial conjunctivitis (commonly called pink eye) is redness, soreness, or puffiness (inflammation) of the white part of your eye. It is caused by a germ called bacteria. These germs can easily spread from person to person (contagious). Your eye often will become red or pink. Your eye may also become irritated, watery, or have a thick discharge.  HOME CARE   Apply a cool, clean washcloth over closed eyelids. Do this for 10-20 minutes, 3-4 times a day while you have pain.  Gently wipe away any fluid coming from the eye with a warm, wet washcloth or cotton ball.  Wash your hands often with soap and water. Use paper towels to dry your hands.  Do not share towels or washcloths.  Change or wash your pillowcase every day.  Do not use eye makeup until the infection is gone.  Do not use machines or drive if your vision is blurry.  Stop using contact lenses. Do not use them again until your doctor says it is okay.  Do not touch the tip of the eye drop bottle or medicine tube with your fingers when you put medicine on the eye. GET HELP RIGHT AWAY IF:   Your eye is not better after 3 days of starting your medicine.  You have a yellowish fluid coming out of the eye.  You have more pain in the eye.  Your eye redness is spreading.  Your vision becomes blurry.  You have a fever or lasting symptoms for more than 2-3 days.  You have a fever and your symptoms suddenly get worse.  You have pain in the face.  Your face gets red or puffy (swollen). MAKE SURE YOU:   Understand these instructions.  Will watch this condition.  Will get help right away if you are not doing well or get worse. Document Released: 09/03/2008 Document Revised: 11/11/2012 Document Reviewed: 07/31/2012 [facility] Patient Information 2015 [place], [place]. This information is not intended to replace advice given to you by your health care provider. Make sure you discuss any questions you have  with your health care provider.  Chronic Obstructive Pulmonary Disease Exacerbation  Chronic obstructive pulmonary disease (COPD) is a common lung problem. In COPD, the flow of air from the lungs is limited. COPD exacerbations are times that breathing gets worse and you need extra treatment. Without treatment they can be life threatening. If they happen often, your lungs can become more damaged. HOME CARE  Do not smoke.  Avoid tobacco smoke and other things that bother your lungs.  If given, take your antibiotic medicine as told. Finish the medicine even if you start to feel better.  Only take medicines as told by your doctor.  Drink enough fluids to keep your pee (urine) clear or pale yellow (unless your doctor has told you not to).  Use a cool mist machine (vaporizer).  If you use oxygen or a machine that turns liquid medicine into a mist (nebulizer), continue to use them as told.  Keep up with shots (vaccinations) as told by your doctor.  Exercise regularly.  Eat healthy foods.  Keep all doctor visits as told. GET HELP RIGHT AWAY IF:  You are very short of breath and it gets worse.  You have trouble talking.  You have bad chest pain.  You have blood in your spit (sputum).  You have a fever.  You keep throwing up (vomiting).  You feel weak, or you pass out (faint).  You feel confused.  You keep getting  worse. MAKE SURE YOU:   Understand these instructions.  Will watch your condition.  Will get help right away if you are not doing well or get worse. Document Released: 11/14/2011 Document Revised: 09/15/2013 Document Reviewed: 07/30/2013 [facility] Patient Information 2015 [place], [place]. This information is not intended to replace advice given to you by your health care provider. Make sure you discuss any questions you have with your health care provider.  Conjunctivitis Conjunctivitis is commonly called "pink eye." Conjunctivitis can be caused by bacterial or  viral infection, allergies, or injuries. There is usually redness of the lining of the eye, itching, discomfort, and sometimes discharge. There may be deposits of matter along the eyelids. A viral infection usually causes a watery discharge, while a bacterial infection causes a yellowish, thick discharge. Pink eye is very contagious and spreads by direct contact. You may be given antibiotic eyedrops as part of your treatment. Before using your eye medicine, remove all drainage from the eye by washing gently with warm water and cotton balls. Continue to use the medication until you have awakened 2 mornings in a row without discharge from the eye. Do not rub your eye. This increases the irritation and helps spread infection. Use separate towels from other household members. Wash your hands with soap and water before and after touching your eyes. Use cold compresses to reduce pain and sunglasses to relieve irritation from light. Do not wear contact lenses or wear eye makeup until the infection is gone. SEEK MEDICAL CARE IF:   Your symptoms are not better after 3 days of treatment.  You have increased pain or trouble seeing.  The outer eyelids become very red or swollen. Document Released: 01/02/2005 Document Revised: 02/17/2012 Document Reviewed: 11/25/2005 [facility] Patient Information 2015 [place], [place]. This information is not intended to replace advice given to you by your health care provider. Make sure you discuss any questions you have with your health care provider.  You Can Quit Smoking If you are ready to quit smoking or are thinking about it, congratulations! You have chosen to help yourself be healthier and live longer! There are lots of different ways to quit smoking. Nicotine gum, nicotine patches, a nicotine inhaler, or nicotine nasal spray can help with physical craving. Hypnosis, support groups, and medicines help break the habit of smoking. TIPS TO GET OFF AND STAY OFF  CIGARETTES  Learn to predict your moods. Do not let a bad situation be your excuse to have a cigarette. Some situations in your life might tempt you to have a cigarette.  Ask friends and co-workers not to smoke around you.  Make your home smoke-free.  Never have "just one" cigarette. It leads to wanting another and another. Remind yourself of your decision to quit.  On a card, make a list of your reasons for not smoking. Read it at least the same number of times a day as you have a cigarette. Tell yourself everyday, "I do not want to smoke. I choose not to smoke."  Ask someone at home or work to help you with your plan to quit smoking.  Have something planned after you eat or have a cup of coffee. Take a walk or get other exercise to perk you up. This will help to keep you from overeating.  Try a relaxation exercise to calm you down and decrease your stress. Remember, you may be tense and nervous the first two weeks after you quit. This will pass.  Find new activities to keep your  hands busy. Play with a pen, coin, or rubber band. Doodle or draw things on paper.  Brush your teeth right after eating. This will help cut down the craving for the taste of tobacco after meals. You can try mouthwash too.  Try gum, breath mints, or diet candy to keep something in your mouth. IF YOU SMOKE AND WANT TO QUIT:  Do not stock up on cigarettes. Never buy a carton. Wait until one pack is finished before you buy another.  Never carry cigarettes with you at work or at home.  Keep cigarettes as far away from you as possible. Leave them with someone else.  Never carry matches or a lighter with you.  Ask yourself, "Do I need this cigarette or is this just a reflex?"  Bet with someone that you can quit. Put cigarette money in a piggy bank every morning. If you smoke, you give up the money. If you do not smoke, by the end of the week, you keep the money.  Keep trying. It takes 21 days to change a  habit!  Talk to your doctor about using medicines to help you quit. These include nicotine replacement gum, lozenges, or skin patches. Document Released: 09/21/2009 Document Revised: 02/17/2012 Document Reviewed: 09/21/2009 [facility] Patient Information 2015 [place], [place]. This information is not intended to replace advice given to you by your health care provider. Make sure you discuss any questions you have with your health care provider.

## 2015-11-27 DIAGNOSIS — E11621 Type 2 diabetes mellitus with foot ulcer: Secondary | ICD-10-CM | POA: Insufficient documentation

## 2015-11-27 DIAGNOSIS — E119 Type 2 diabetes mellitus without complications: Secondary | ICD-10-CM | POA: Insufficient documentation

## 2015-11-29 DIAGNOSIS — F1523 Other stimulant dependence with withdrawal: Secondary | ICD-10-CM | POA: Insufficient documentation

## 2015-11-29 DIAGNOSIS — F19930 Other psychoactive substance use, unspecified with withdrawal, uncomplicated: Secondary | ICD-10-CM | POA: Insufficient documentation

## 2015-11-29 DIAGNOSIS — F1593 Other stimulant use, unspecified with withdrawal: Secondary | ICD-10-CM | POA: Insufficient documentation

## 2016-01-19 ENCOUNTER — Inpatient Hospital Stay
Admission: AD | Admit: 2016-01-19 | Discharge: 2016-01-20 | DRG: 638 | Disposition: A | Discharge status: Home Health | Payer: Medicare Other | Source: Ambulatory Visit | Attending: Internal Medicine | Admitting: Internal Medicine

## 2016-01-19 ENCOUNTER — Encounter: Payer: Self-pay | Admitting: Internal Medicine

## 2016-01-19 ENCOUNTER — Inpatient Hospital Stay: Payer: Medicare Other

## 2016-01-19 DIAGNOSIS — I10 Essential (primary) hypertension: Secondary | ICD-10-CM | POA: Diagnosis present

## 2016-01-19 DIAGNOSIS — K219 Gastro-esophageal reflux disease without esophagitis: Secondary | ICD-10-CM | POA: Diagnosis present

## 2016-01-19 DIAGNOSIS — G9340 Encephalopathy, unspecified: Secondary | ICD-10-CM

## 2016-01-19 DIAGNOSIS — L97519 Non-pressure chronic ulcer of other part of right foot with unspecified severity: Secondary | ICD-10-CM | POA: Diagnosis present

## 2016-01-19 DIAGNOSIS — E785 Hyperlipidemia, unspecified: Secondary | ICD-10-CM | POA: Diagnosis present

## 2016-01-19 DIAGNOSIS — Z79899 Other long term (current) drug therapy: Secondary | ICD-10-CM

## 2016-01-19 DIAGNOSIS — F329 Major depressive disorder, single episode, unspecified: Secondary | ICD-10-CM | POA: Diagnosis present

## 2016-01-19 DIAGNOSIS — G8929 Other chronic pain: Secondary | ICD-10-CM | POA: Diagnosis present

## 2016-01-19 DIAGNOSIS — M549 Dorsalgia, unspecified: Secondary | ICD-10-CM | POA: Diagnosis present

## 2016-01-19 DIAGNOSIS — L97513 Non-pressure chronic ulcer of other part of right foot with necrosis of muscle: Secondary | ICD-10-CM

## 2016-01-19 DIAGNOSIS — E11621 Type 2 diabetes mellitus with foot ulcer: Principal | ICD-10-CM | POA: Diagnosis present

## 2016-01-19 DIAGNOSIS — Z7952 Long term (current) use of systemic steroids: Secondary | ICD-10-CM | POA: Diagnosis not present

## 2016-01-19 DIAGNOSIS — J45909 Unspecified asthma, uncomplicated: Secondary | ICD-10-CM | POA: Diagnosis present

## 2016-01-19 DIAGNOSIS — L97429 Non-pressure chronic ulcer of left heel and midfoot with unspecified severity: Secondary | ICD-10-CM | POA: Diagnosis present

## 2016-01-19 DIAGNOSIS — T40601A Poisoning by unspecified narcotics, accidental (unintentional), initial encounter: Secondary | ICD-10-CM | POA: Diagnosis present

## 2016-01-19 DIAGNOSIS — M109 Gout, unspecified: Secondary | ICD-10-CM | POA: Diagnosis present

## 2016-01-19 DIAGNOSIS — G4730 Sleep apnea, unspecified: Secondary | ICD-10-CM | POA: Diagnosis present

## 2016-01-19 DIAGNOSIS — G92 Toxic encephalopathy: Secondary | ICD-10-CM | POA: Diagnosis present

## 2016-01-19 DIAGNOSIS — L97509 Non-pressure chronic ulcer of other part of unspecified foot with unspecified severity: Secondary | ICD-10-CM | POA: Diagnosis present

## 2016-01-19 DIAGNOSIS — E876 Hypokalemia: Secondary | ICD-10-CM | POA: Diagnosis present

## 2016-01-19 DIAGNOSIS — M1990 Unspecified osteoarthritis, unspecified site: Secondary | ICD-10-CM | POA: Diagnosis present

## 2016-01-19 DIAGNOSIS — J449 Chronic obstructive pulmonary disease, unspecified: Secondary | ICD-10-CM | POA: Diagnosis present

## 2016-01-19 DIAGNOSIS — E1142 Type 2 diabetes mellitus with diabetic polyneuropathy: Secondary | ICD-10-CM | POA: Diagnosis present

## 2016-01-19 DIAGNOSIS — L03115 Cellulitis of right lower limb: Secondary | ICD-10-CM | POA: Diagnosis present

## 2016-01-19 HISTORY — DX: Calculus of kidney: N20.0

## 2016-01-19 HISTORY — DX: Depression, unspecified: F32.A

## 2016-01-19 HISTORY — DX: Unspecified asthma, uncomplicated: J45.909

## 2016-01-19 HISTORY — DX: Sleep apnea, unspecified: G47.30

## 2016-01-19 HISTORY — DX: Other chronic pain: G89.29

## 2016-01-19 HISTORY — DX: Polyneuropathy, unspecified: G62.9

## 2016-01-19 HISTORY — DX: Major depressive disorder, single episode, unspecified: F32.9

## 2016-01-19 HISTORY — DX: Chronic obstructive pulmonary disease, unspecified: J44.9

## 2016-01-19 HISTORY — DX: Dorsalgia, unspecified: M54.9

## 2016-01-19 HISTORY — DX: Unspecified osteoarthritis, unspecified site: M19.90

## 2016-01-19 HISTORY — DX: Type 2 diabetes mellitus without complications: E11.9

## 2016-01-19 LAB — BLOOD GAS, ARTERIAL
Acid-Base Excess: 2.5 mmol/L (ref 0.0–3.0)
Allens test (pass/fail): POSITIVE — AB
Bicarbonate: 28.8 mEq/L — ABNORMAL HIGH (ref 21.0–28.0)
FIO2: 0.28
O2 Saturation: 95.9 %
Patient temperature: 37
pCO2 arterial: 51 mmHg — ABNORMAL HIGH (ref 32.0–48.0)
pH, Arterial: 7.36 (ref 7.350–7.450)
pO2, Arterial: 84 mmHg (ref 83.0–108.0)

## 2016-01-19 LAB — SALICYLATE LEVEL: Salicylate Lvl: <4 mg/dL (ref 2.8–30.0)

## 2016-01-19 LAB — CBC WITH DIFFERENTIAL/PLATELET
Basophils Absolute: 0.1 10*3/uL (ref 0–0.1)
Basophils Relative: 1 %
Eosinophils Absolute: 0.3 10*3/uL (ref 0–0.7)
Eosinophils Relative: 5 %
HCT: 39.2 % — ABNORMAL LOW (ref 40.0–52.0)
Hemoglobin: 13.1 g/dL (ref 13.0–18.0)
Lymphocytes Relative: 21 %
Lymphs Abs: 1.4 10*3/uL (ref 1.0–3.6)
MCH: 30 pg (ref 26.0–34.0)
MCHC: 33.4 g/dL (ref 32.0–36.0)
MCV: 89.9 fL (ref 80.0–100.0)
Monocytes Absolute: 0.5 10*3/uL (ref 0.2–1.0)
Monocytes Relative: 7 %
Neutro Abs: 4.4 10*3/uL (ref 1.4–6.5)
Neutrophils Relative %: 66 %
Platelets: 307 10*3/uL (ref 150–440)
RBC: 4.36 MIL/uL — ABNORMAL LOW (ref 4.40–5.90)
RDW: 13.5 % (ref 11.5–14.5)
WBC: 6.7 10*3/uL (ref 3.8–10.6)

## 2016-01-19 LAB — PROTIME-INR
INR: 1.01
Prothrombin Time: 13.5 seconds (ref 11.4–15.0)

## 2016-01-19 LAB — URINALYSIS COMPLETE WITH MICROSCOPIC (ARMC ONLY)
Bacteria, UA: NONE SEEN
Bilirubin Urine: NEGATIVE
Glucose, UA: NEGATIVE mg/dL
Hgb urine dipstick: NEGATIVE
Ketones, ur: NEGATIVE mg/dL
Leukocytes, UA: NEGATIVE
Nitrite: NEGATIVE
Protein, ur: NEGATIVE mg/dL
RBC / HPF: NONE SEEN RBC/hpf (ref 0–5)
Specific Gravity, Urine: 1.003 — ABNORMAL LOW (ref 1.005–1.030)
pH: 5 (ref 5.0–8.0)

## 2016-01-19 LAB — COMPREHENSIVE METABOLIC PANEL
ALT: 13 U/L — ABNORMAL LOW (ref 17–63)
AST: 17 U/L (ref 15–41)
Albumin: 3.6 g/dL (ref 3.5–5.0)
Alkaline Phosphatase: 103 U/L (ref 38–126)
Anion gap: 10 (ref 5–15)
BUN: 10 mg/dL (ref 6–20)
CO2: 28 mmol/L (ref 22–32)
Calcium: 9.5 mg/dL (ref 8.9–10.3)
Chloride: 101 mmol/L (ref 101–111)
Creatinine, Ser: 0.59 mg/dL — ABNORMAL LOW (ref 0.61–1.24)
GFR calc Af Amer: >60 mL/min (ref >60)
GFR calc non Af Amer: >60 mL/min (ref >60)
Glucose, Bld: 100 mg/dL — ABNORMAL HIGH (ref 65–99)
Potassium: 3.1 mmol/L — ABNORMAL LOW (ref 3.5–5.1)
Sodium: 139 mmol/L (ref 135–145)
Total Bilirubin: 0.5 mg/dL (ref 0.3–1.2)
Total Protein: 7.2 g/dL (ref 6.5–8.1)

## 2016-01-19 LAB — URINE DRUG SCREEN, QUALITATIVE (ARMC ONLY)
Amphetamines, Ur Screen: POSITIVE — AB
Barbiturates, Ur Screen: NOT DETECTED
Benzodiazepine, Ur Scrn: NOT DETECTED
Cannabinoid 50 Ng, Ur ~~LOC~~: NOT DETECTED
Cocaine Metabolite,Ur ~~LOC~~: NOT DETECTED
MDMA (Ecstasy)Ur Screen: NOT DETECTED
Methadone Scn, Ur: NOT DETECTED
Opiate, Ur Screen: NOT DETECTED
Phencyclidine (PCP) Ur S: NOT DETECTED
Tricyclic, Ur Screen: NOT DETECTED

## 2016-01-19 LAB — GLUCOSE, CAPILLARY: Glucose-Capillary: 128 mg/dL — ABNORMAL HIGH (ref 65–99)

## 2016-01-19 LAB — AMMONIA: Ammonia: 28 umol/L (ref 9–35)

## 2016-01-19 LAB — ACETAMINOPHEN LEVEL: Acetaminophen (Tylenol), Serum: <10 ug/mL — ABNORMAL LOW (ref 10–30)

## 2016-01-19 LAB — TSH: TSH: 1.359 u[IU]/mL (ref 0.350–4.500)

## 2016-01-19 LAB — APTT: aPTT: 35 seconds (ref 24–36)

## 2016-01-19 LAB — ETHANOL: Alcohol, Ethyl (B): 83 mg/dL — ABNORMAL HIGH (ref <5)

## 2016-01-19 MED ORDER — NALOXONE HCL 0.4 MG/ML IJ SOLN
0.4000 mg | Freq: Once | INTRAMUSCULAR | Status: AC
  Administered 2016-01-19: 0.4 mg via INTRAVENOUS
  Filled 2016-01-19: qty 1

## 2016-01-19 MED ORDER — DOCUSATE SODIUM 100 MG PO CAPS
100.0000 mg | ORAL_CAPSULE | Freq: Two times a day (BID) | ORAL | Status: DC
  Administered 2016-01-20: 100 mg via ORAL
  Filled 2016-01-19 (×2): qty 1

## 2016-01-19 MED ORDER — SODIUM CHLORIDE 0.9 % IV BOLUS (SEPSIS)
1000.0000 mL | Freq: Once | INTRAVENOUS | Status: AC
  Administered 2016-01-19: 1000 mL via INTRAVENOUS

## 2016-01-19 MED ORDER — OXYCODONE HCL 5 MG PO TABS: 5.0000 mg | ORAL_TABLET | ORAL | Status: DC | PRN

## 2016-01-19 MED ORDER — POLYETHYLENE GLYCOL 3350 17 G PO PACK: 17.0000 g | PACK | Freq: Every day | ORAL | Status: DC | PRN

## 2016-01-19 MED ORDER — INSULIN ASPART 100 UNIT/ML ~~LOC~~ SOLN: 0.0000 [IU] | Freq: Three times a day (TID) | SUBCUTANEOUS | Status: DC

## 2016-01-19 MED ORDER — ENOXAPARIN SODIUM 40 MG/0.4ML ~~LOC~~ SOLN
40.0000 mg | SUBCUTANEOUS | Status: DC
  Administered 2016-01-19: 40 mg via SUBCUTANEOUS

## 2016-01-19 MED ORDER — INSULIN ASPART 100 UNIT/ML ~~LOC~~ SOLN: 0.0000 [IU] | Freq: Every day | SUBCUTANEOUS | Status: DC

## 2016-01-19 MED ORDER — ASPIRIN EC 81 MG PO TBEC
81.0000 mg | DELAYED_RELEASE_TABLET | Freq: Every day | ORAL | Status: DC
  Administered 2016-01-20: 81 mg via ORAL
  Filled 2016-01-19: qty 1

## 2016-01-19 MED ORDER — SODIUM CHLORIDE 0.9 % IV SOLN: 250.0000 mL | INTRAVENOUS | Status: DC | PRN

## 2016-01-19 MED ORDER — ACETAMINOPHEN 650 MG RE SUPP: 650.0000 mg | Freq: Four times a day (QID) | RECTAL | Status: DC | PRN

## 2016-01-19 MED ORDER — SODIUM CHLORIDE 0.9% FLUSH
3.0000 mL | Freq: Two times a day (BID) | INTRAVENOUS | Status: DC
  Administered 2016-01-19: 3 mL via INTRAVENOUS

## 2016-01-19 MED ORDER — MORPHINE SULFATE (PF) 2 MG/ML IV SOLN: 2.0000 mg | INTRAVENOUS | Status: DC | PRN

## 2016-01-19 MED ORDER — ACETAMINOPHEN 325 MG PO TABS: 650.0000 mg | ORAL_TABLET | Freq: Four times a day (QID) | ORAL | Status: DC | PRN

## 2016-01-19 MED ORDER — SODIUM CHLORIDE 0.9 % IV SOLN
INTRAVENOUS | Status: DC
  Administered 2016-01-19: 23:00:00 via INTRAVENOUS

## 2016-01-19 MED ORDER — BISACODYL 5 MG PO TBEC: 5.0000 mg | DELAYED_RELEASE_TABLET | Freq: Every day | ORAL | Status: DC | PRN

## 2016-01-19 MED ORDER — SODIUM CHLORIDE 0.9% FLUSH: 3.0000 mL | INTRAVENOUS | Status: DC | PRN

## 2016-01-19 NOTE — H&P (Signed)
Christian Fields at [place] NAME: Christian Fields    MR#:  [id number]  DATE OF BIRTH:  09-05-1956  DATE OF ADMISSION:  01/19/2016  PRIMARY CARE PHYSICIAN: No PCP Per Patient   REQUESTING/REFERRING PHYSICIAN: Dr. Elvina Mattes  CHIEF COMPLAINT:  No chief complaint on file.  Right foot ulcer  HISTORY OF PRESENT ILLNESS:  Christian Fields  is a 60 y.o. male with a known history of diabetes, hypertension, drug abuse, COPD, sleep apnea presents to the hospital as a direct admission from podiatry Dr. Selina Cooley office. Patient has had chronic right great toe ulcers with debridement as outpatient not helping. Patient is being admitted to the hospital for osteomyelitis. Afebrile.  Patient on arrival to the floor has been found to be extremely drowsy. He does open his eyes. Unable to give any history. Patient does have history of overdose.  1 dose of 0.4 mg Narcan given after placing on telemetry. No response.  History obtained from nursing staff and reviewing old records.  PAST MEDICAL HISTORY:   Past Medical History  Diagnosis Date  . Hypertension   . Hyperlipidemia   . Gout   . Neuropathy ([place])   . COPD (chronic obstructive pulmonary disease) ([place])   . Gout   . Sleep apnea   . Diabetes mellitus ([place])     PAST SURGICAL HISTORY:   Past Surgical History  Procedure Laterality Date  . Dg feet 2 views bilat    . Appendectomy      SOCIAL HISTORY:   Social History  Substance Use Topics  . Smoking status: Current Every Day Smoker  . Smokeless tobacco: Never Used  . Alcohol Use: 0.0 oz/week    0 Standard drinks or equivalent per week    FAMILY HISTORY:   Family History  Problem Relation Age of Onset  . Hypertension Mother     DRUG ALLERGIES:  No Known Allergies  REVIEW OF SYSTEMS:   Review of Systems  Unable to perform ROS  Encephalopathy  MEDICATIONS AT HOME:   Prior to Admission medications   Medication Sig Start Date End  Date Taking? Authorizing Provider  amoxicillin-clavulanate (AUGMENTIN) 875-125 MG per tablet Take 1 tablet by mouth 2 (two) times daily. 08/24/15   Frederich Cha, MD  predniSONE (STERAPRED UNI-PAK 21 TAB) 10 MG (21) TBPK tablet Sig 6 tablet day 1, 5 tablets day 2, 4 tablets day 3,,3tablets day 4, 2 tablets day 5, 1 tablet day 6 take all tablets orally 08/24/15   Frederich Cha, MD      VITAL SIGNS:  Blood pressure 119/61, pulse 82, temperature 97.7 F (36.5 C), temperature source Oral, resp. rate 18, SpO2 94 %.  PHYSICAL EXAMINATION:  Physical Exam  GENERAL:  60 y.o.-year-old patient lying in the bed with no acute distress. Opens size.  EYES: Pupils equal, round, Pin point. No scleral icterus.  HEENT: Head atraumatic, normocephalic.  NECK:  Supple, no jugular venous distention. No thyroid enlargement, no tenderness.  LUNGS: Normal breath sounds bilaterally, no wheezing, rales, rhonchi. No use of accessory muscles of respiration.  CARDIOVASCULAR: S1, S2 normal. No murmurs, rubs, or gallops.  ABDOMEN: Soft, nontender, nondistended. Bowel sounds present. No organomegaly or mass.  EXTREMITIES: No pedal edema, cyanosis, or clubbing. + 2 pedal & radial pulses b/l.   NEUROLOGIC: Cranial nerves II through XII are intact. No focal Motor or sensory deficits appreciated b/l PSYCHIATRIC: The patient is drowsy SKIN:  right foot ulceration. No purulent discharge. LABORATORY PANEL:  CBC No results for input(s): WBC, HGB, HCT, PLT in the last 168 hours. ------------------------------------------------------------------------------------------------------------------  Chemistries  No results for input(s): NA, K, CL, CO2, GLUCOSE, BUN, CREATININE, CALCIUM, MG, AST, ALT, ALKPHOS, BILITOT in the last 168 hours.  Invalid input(s): GFRCGP ------------------------------------------------------------------------------------------------------------------  Cardiac Enzymes No results for input(s): TROPONINI in  the last 168 hours. ------------------------------------------------------------------------------------------------------------------  RADIOLOGY:  No results found.   IMPRESSION AND PLAN:   * Acute encephalopathy Pin point pupils. Likely narcotic overdose. STAT dose of narcan did not help though. Tele. ABG, Ammonia, TSH. Urine drug screen glucose 128  * Right foot ulcer Per podiatry. Afebrile. Will check CBC.  check x-ray of the foot. No antibiotics at this time.   * Diabetes mellitus Sliding scale insulin.  * Hypertension Daniel medications  * COPD No wheezing at this time. Nebulizers when necessary. Inhalers.  * DVT prophylaxis with Lovenox   All the records are reviewed and case discussed with ED provider. Management plans discussed with the patient, family and they are in agreement.  CODE STATUS: FULL  TOTAL CC TIME TAKING CARE OF THIS PATIENT: 40 minutes.    Hillary Bow R M.D on 01/19/2016 at 8:47 PM  Between 7am to 6pm - Pager - [phone]  After 6pm go to [email or web address] - password [email or web address] [facility]  [facility]  Office  [phone]  CC: Primary care physician; No PCP Per Patient     Note: This dictation was prepared with Dragon dictation along with smaller phrase technology. Any transcriptional errors that result from this process are unintentional.

## 2016-01-20 ENCOUNTER — Inpatient Hospital Stay: Payer: Medicare Other

## 2016-01-20 LAB — GLUCOSE, CAPILLARY
Glucose-Capillary: 76 mg/dL (ref 65–99)
Glucose-Capillary: 82 mg/dL (ref 65–99)

## 2016-01-20 LAB — SEDIMENTATION RATE: Sed Rate: 34 mm/hr — ABNORMAL HIGH (ref 0–20)

## 2016-01-20 MED ORDER — LINAGLIPTIN 5 MG PO TABS: 5.0000 mg | ORAL_TABLET | Freq: Every day | ORAL | Status: DC

## 2016-01-20 MED ORDER — POTASSIUM CHLORIDE CRYS ER 20 MEQ PO TBCR
40.0000 meq | EXTENDED_RELEASE_TABLET | Freq: Once | ORAL | Status: AC
  Administered 2016-01-20: 40 meq via ORAL
  Filled 2016-01-20: qty 2

## 2016-01-20 MED ORDER — FLUTICASONE PROPIONATE 50 MCG/ACT NA SUSP
2.0000 | Freq: Every day | NASAL | Status: DC
  Filled 2016-01-20: qty 16

## 2016-01-20 MED ORDER — GABAPENTIN 400 MG PO CAPS
800.0000 mg | ORAL_CAPSULE | Freq: Four times a day (QID) | ORAL | Status: DC
  Administered 2016-01-20: 800 mg via ORAL
  Filled 2016-01-20: qty 2

## 2016-01-20 MED ORDER — PIPERACILLIN-TAZOBACTAM 3.375 G IVPB
3.3750 g | Freq: Three times a day (TID) | INTRAVENOUS | Status: DC
  Filled 2016-01-20: qty 50

## 2016-01-20 MED ORDER — SAXAGLIPTIN-METFORMIN ER 2.5-1000 MG PO TB24: 1.0000 | ORAL_TABLET | Freq: Two times a day (BID) | ORAL | Status: DC

## 2016-01-20 MED ORDER — DULOXETINE HCL 30 MG PO CPEP
30.0000 mg | ORAL_CAPSULE | Freq: Every day | ORAL | Status: DC
  Administered 2016-01-20: 30 mg via ORAL
  Filled 2016-01-20: qty 1

## 2016-01-20 MED ORDER — PANTOPRAZOLE SODIUM 40 MG PO TBEC
40.0000 mg | DELAYED_RELEASE_TABLET | Freq: Every day | ORAL | Status: DC
  Administered 2016-01-20: 40 mg via ORAL
  Filled 2016-01-20: qty 1

## 2016-01-20 MED ORDER — PIPERACILLIN-TAZOBACTAM 3.375 G IVPB
3.3750 g | Freq: Three times a day (TID) | INTRAVENOUS | Status: DC
  Filled 2016-01-20 (×2): qty 50

## 2016-01-20 MED ORDER — ASPIRIN EC 81 MG PO TBEC: 81.0000 mg | DELAYED_RELEASE_TABLET | Freq: Every day | ORAL | Status: DC

## 2016-01-20 MED ORDER — BUPRENORPHINE HCL 8 MG SL SUBL: 8.0000 mg | SUBLINGUAL_TABLET | Freq: Every day | SUBLINGUAL | Status: DC

## 2016-01-20 MED ORDER — NICOTINE POLACRILEX 2 MG MT LOZG
2.0000 mg | LOZENGE | OROMUCOSAL | Status: DC | PRN
  Filled 2016-01-20: qty 1

## 2016-01-20 MED ORDER — CLONAZEPAM 1 MG PO TABS
1.0000 mg | ORAL_TABLET | Freq: Three times a day (TID) | ORAL | Status: DC
  Administered 2016-01-20: 1 mg via ORAL
  Filled 2016-01-20: qty 1

## 2016-01-20 MED ORDER — DIAZEPAM 5 MG PO TABS
5.0000 mg | ORAL_TABLET | Freq: Once | ORAL | Status: AC
  Administered 2016-01-20: 5 mg via ORAL
  Filled 2016-01-20: qty 1

## 2016-01-20 MED ORDER — GABAPENTIN 800 MG PO TABS
800.0000 mg | ORAL_TABLET | Freq: Four times a day (QID) | ORAL | Status: DC
  Filled 2016-01-20 (×4): qty 1

## 2016-01-20 MED ORDER — VITAMIN D 1000 UNITS PO TABS
1000.0000 [IU] | ORAL_TABLET | Freq: Every day | ORAL | Status: DC
  Administered 2016-01-20: 1000 [IU] via ORAL
  Filled 2016-01-20: qty 1

## 2016-01-20 MED ORDER — OMEGA-3-ACID ETHYL ESTERS 1 G PO CAPS
1.0000 g | ORAL_CAPSULE | Freq: Two times a day (BID) | ORAL | Status: DC
  Administered 2016-01-20: 1 g via ORAL
  Filled 2016-01-20: qty 1

## 2016-01-20 MED ORDER — METFORMIN HCL ER 500 MG PO TB24: 1000.0000 mg | ORAL_TABLET | Freq: Two times a day (BID) | ORAL | Status: DC

## 2016-01-20 MED ORDER — FERROUS SULFATE 325 (65 FE) MG PO TABS: 325.0000 mg | ORAL_TABLET | Freq: Every day | ORAL | Status: DC

## 2016-01-20 MED ORDER — POTASSIUM CHLORIDE 10 MEQ/100ML IV SOLN
10.0000 meq | INTRAVENOUS | Status: AC
  Administered 2016-01-20 (×3): 10 meq via INTRAVENOUS
  Filled 2016-01-20 (×4): qty 100

## 2016-01-20 MED ORDER — ALLOPURINOL 100 MG PO TABS
100.0000 mg | ORAL_TABLET | Freq: Every day | ORAL | Status: DC
  Administered 2016-01-20: 100 mg via ORAL
  Filled 2016-01-20: qty 1

## 2016-01-20 MED ORDER — PIPERACILLIN-TAZOBACTAM 3.375 G IVPB 30 MIN: 3.3750 g | Freq: Four times a day (QID) | INTRAVENOUS | Status: DC

## 2016-01-20 MED ORDER — AMOXICILLIN-POT CLAVULANATE 875-125 MG PO TABS: 1.0000 | ORAL_TABLET | Freq: Two times a day (BID) | ORAL | Status: DC

## 2016-01-20 MED ORDER — BUDESONIDE-FORMOTEROL FUMARATE 160-4.5 MCG/ACT IN AERO
2.0000 | INHALATION_SPRAY | Freq: Two times a day (BID) | RESPIRATORY_TRACT | Status: DC
  Administered 2016-01-20: 2 via RESPIRATORY_TRACT
  Filled 2016-01-20: qty 6

## 2016-01-20 MED ORDER — TIOTROPIUM BROMIDE MONOHYDRATE 18 MCG IN CAPS
1.0000 | ORAL_CAPSULE | Freq: Every day | RESPIRATORY_TRACT | Status: DC
  Filled 2016-01-20: qty 5

## 2016-01-20 MED ORDER — SIMVASTATIN 40 MG PO TABS: 40.0000 mg | ORAL_TABLET | Freq: Every day | ORAL | Status: DC

## 2016-01-20 NOTE — Progress Notes (Signed)
Initial Nutrition Assessment   INTERVENTION:   Meals and Snacks: Cater to patient preferences Medical Food Supplement Therapy: will send Sugar Free Mighty Shake for added protein BID   NUTRITION DIAGNOSIS:   Increased nutrient needs related to wound healing as evidenced by estimated needs.  GOAL:   Patient will meet greater than or equal to 90% of their needs  MONITOR:    (Energy Intake, glucose Profile, Skin, Anthropometrics)  REASON FOR ASSESSMENT:   Malnutrition Screening Tool    ASSESSMENT:   Pt admitted with right foot cellulitis per MD note in need of bedside debridement s/p MRI. Pt likely to be d/c today.  Past Medical History  Diagnosis Date  . Hypertension   . Hyperlipidemia   . Gout   . Neuropathy ([place])   . COPD (chronic obstructive pulmonary disease) ([place])   . Gout   . Sleep apnea   . Diabetes mellitus ([place])   . Asthma   . Depression   . Chronic back pain   . Kidney stones   . Arthritis      Diet Order:  Diet Carb Modified Fluid consistency:: Thin; Room service appropriate?: Yes    Current Nutrition: Pt ate bites of pasta with green beans and handful of popcorn for lunch  And reports eating a good breakfast this am.   Food/Nutrition-Related History: Pt reports good appetite PTA eating 2 meals per day breakfast and dinner.    Scheduled Medications:  . allopurinol  100 mg Oral Daily  . aspirin EC  81 mg Oral Daily  . budesonide-formoterol  2 puff Inhalation BID  . buprenorphine  8 mg Sublingual Daily  . cholecalciferol  1,000 Units Oral Daily  . clonazePAM  1 mg Oral TID  . docusate sodium  100 mg Oral BID  . DULoxetine  30 mg Oral Daily  . enoxaparin (LOVENOX) injection  40 mg Subcutaneous Q24H  . [START ON 01/21/2016] ferrous sulfate  325 mg Oral Q breakfast  . fluticasone  2 spray Each Nare Daily  . gabapentin  800 mg Oral QID  . insulin aspart  0-5 Units Subcutaneous QHS  . insulin aspart  0-9 Units Subcutaneous TID WC  . linagliptin   5 mg Oral Daily  . omega-3 acid ethyl esters  1 g Oral BID  . pantoprazole  40 mg Oral Daily  . piperacillin-tazobactam (ZOSYN)  IV  3.375 g Intravenous 3 times per day  . simvastatin  40 mg Oral q1800  . sodium chloride flush  3 mL Intravenous Q12H  . tiotropium  1 capsule Inhalation Daily    Continuous Medications:  . sodium chloride 100 mL/hr at 01/19/16 2321     Electrolyte/Renal Profile and Glucose Profile:   Recent Labs Lab 01/19/16 2100  NA 139  K 3.1*  CL 101  CO2 28  BUN 10  CREATININE 0.59*  CALCIUM 9.5  GLUCOSE 100*   Protein Profile:  Recent Labs Lab 01/19/16 2100  ALBUMIN 3.6    Gastrointestinal Profile: Last BM: unknown    Weight Change: Pt family member reports pt weight fluctuates by 10lbs up and down.   Height:   Ht Readings from Last 1 Encounters:  01/19/16 '5\' 5"'$  (1.651 m)    Weight:   Wt Readings from Last 1 Encounters:  01/20/16 142 lb 12.8 oz (64.774 kg)   BMI:  Body mass index is 23.76 kg/(m^2).   EDUCATION NEEDS:   Education needs no appropriate at this time  LOW Care Level  Davonte Siebenaler  Melina Modena, RD, LDN Pager [phone] Weekend/On-Call Pager [phone]

## 2016-01-20 NOTE — Care Management Note (Signed)
Case Management Note  Patient Details  Name: Christian Fields MRN: [id number] Date of Birth: 02-Apr-1956  Subjective/Objective:    Dr Elvina Mattes will write home health orders. A referral for home health RN, PT, Aid was called and faxed to [facility]. Apparently the wound care instructions will be in the Discharge Summary being dictated today. Cheryl at [facility] will call case management to fax the Discharge Summary to her if she does not receive it by tomorrow.                Action/Plan:   Expected Discharge Date:  01/23/16               Expected Discharge Plan:     In-House Referral:     Discharge planning Services     Post Acute Care Choice:    Choice offered to:     DME Arranged:    DME Agency:     HH Arranged:    [place] Agency:     Status of Service:     Medicare Important Message Given:    Date Medicare IM Given:    Medicare IM give by:    Date Additional Medicare IM Given:    Additional Medicare Important Message give by:     If discussed at [place] of Stay Meetings, dates discussed:    Additional Comments:  Travelle Mcclimans A, RN 01/20/2016, 1:32 PM

## 2016-01-20 NOTE — Progress Notes (Signed)
DISCHARGE NOTE:  Pt given discharge instructions. Pt  will pick up antibiotics from pharmacy. Pt verbalized understanding. Pt wheeled to car by staff.

## 2016-01-20 NOTE — Progress Notes (Signed)
K 3.1  Prime doc Marcille Blanco) paged, orders received. Will administer IV potassium per md order.

## 2016-01-20 NOTE — Progress Notes (Signed)
Subjective: Patient seen. Very sleepy and not very responsive.  Objective: Vital signs in last 24 hours: Temp:  [97.7 F (36.5 C)-98.6 F (37 C)] 98.1 F (36.7 C) (02/11 1221) Pulse Rate:  [65-82] 65 (02/11 1221) Resp:  [18] 18 (02/11 1221) BP: (101-119)/(59-70) 106/67 mmHg (02/11 1221) SpO2:  [91 %-98 %] 98 % (02/11 1221) Weight:  [63.368 kg (139 lb 11.2 oz)-64.774 kg (142 lb 12.8 oz)] 64.774 kg (142 lb 12.8 oz) (02/11 0618) Last BM Date:  (unk, pt obtunded)  Intake/Output from previous day: 02/10 0701 - 02/11 0700 In: 1918.3 [I.V.:718.3; IV Piggyback:1200] Out: 935 [Urine:935] Intake/Output this shift: Total I/O In: 240 [P.O.:240] Out: 500 [Urine:500]  MRI results returned with no clear evidence of invasion of the ulceration into the joint. No evidence of bone involvement or signs of septic joint or osteomyelitis.  The ulceration on the plantar aspect of the right foot measures approximately 1.5 cm in diameter and does extend full thickness to a depth of approximately 1 cm. It does extend through to the deep subcutaneous tissue and fat tissue.  Lab Results:   Recent Labs  01/19/16 2100  WBC 6.7  HGB 13.1  HCT 39.2*  PLT 307   BMET  Recent Labs  01/19/16 2100  NA 139  K 3.1*  CL 101  CO2 28  GLUCOSE 100*  BUN 10  CREATININE 0.59*  CALCIUM 9.5   PT/INR  Recent Labs  01/19/16 2100  LABPROT 13.5  INR 1.01   ABG  Recent Labs  01/19/16 2200  PHART 7.36  HCO3 28.8*    Studies/Results: Ct Head Wo Contrast  01/19/2016  CLINICAL DATA:  Extremely drowsy. Encephalopathy, diabetes, hypertension, drug abuse. Also with chronic right great toe ulcers with debridement. EXAM: CT HEAD WITHOUT CONTRAST TECHNIQUE: Contiguous axial images were obtained from the base of the skull through the vertex without intravenous contrast. COMPARISON:  Head CT dated 03/24/2013. FINDINGS: There is generalized brain atrophy, moderate in degree for age, with commensurate  dilatation of the ventricles and sulci, stable compared to the previous exam. 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 Mild chronic small vessel ischemic changes again noted within the bilateral periventricular white matter regions. There is no mass, hemorrhage, edema or other evidence of acute parenchymal abnormality. No extra-axial hemorrhage. No acute osseous abnormality. Paranasal sinuses and mastoid air cells are clear. Superficial soft tissues are unremarkable. IMPRESSION: 1. No acute findings.  No intracranial mass, hemorrhage or edema. 2. Atrophy and chronic ischemic changes as detailed above. Electronically Signed   By: Franki Cabot M.D.   On: 01/19/2016 22:32   Mr Foot Right Wo Contrast  01/20/2016  CLINICAL DATA:  60 year old diabetic with chronic great toe ulceration unresponsive to debridement, admitted to hospital for osteomyelitis. EXAM: MRI OF THE RIGHT FOREFOOT WITHOUT CONTRAST TECHNIQUE: Multiplanar, multisequence MR imaging was performed. No intravenous contrast was administered. COMPARISON:  Foot radiographs 01/19/2016. MRI of the ankle 09/19/2006. FINDINGS: There is a 12 mm soft tissue defect over the plantar aspect of the first metatarsal phalangeal joint, presumably a surgical defect. The surrounding subcutaneous fat demonstrates diffusely decreased T1 and increased T2 signal without focal fluid collection on noncontrast imaging. These signal abnormalities extend into the medial aspect of the great toe. There is mild nonspecific subcutaneous edema and muscular T2 hyperintensity throughout the forefoot. There are degenerative changes at the first metatarsal phalangeal joint with osteophytes and subchondral cyst formation. There is a small effusion of the first metatarsal phalangeal  joint with synovial thickening suspicious for synovitis. There is no bone destruction or subchondral edema to suggest osteomyelitis. Mild chronic deformity of the distal first phalanx  appears stable. The additional toes, metatarsal phalangeal joints and metatarsals appear normal. The alignment is normal at the Lisfranc joint. IMPRESSION: 1. Ill-defined inflammatory changes surrounding the ulcer or surgical defect over the plantar aspect of the first metatarsal phalangeal joint. No drainable abscess demonstrated. 2. No specific signs of osteomyelitis. 3. Underlying first MTP degenerative changes with possible synovitis. No specific signs of septic joint. Electronically Signed   By: Richardean Sale M.D.   On: 01/20/2016 12:15   Dg Foot 2 Views Right  01/19/2016  CLINICAL DATA:  Ulceration along the plantar aspect of the right medial foot. Initial encounter. EXAM: RIGHT FOOT - 2 VIEW COMPARISON:  CT of the right foot performed 02/04/2007 FINDINGS: There is no evidence of fracture or dislocation. Diffuse soft tissue swelling is noted along the plantar aspect of the forefoot. Soft tissue swelling is also noted about the great toe. No definite osseous erosions are seen. Mild degenerative change is noted about the first interphalangeal joint. There is chronic flattening of the talus. A small plantar calcaneal spur is seen. IMPRESSION: No evidence of fracture or dislocation. No definite osseous erosion seen. Diffuse soft tissue swelling about the plantar aspect of the forefoot. Known ulcerations are not well characterized on radiograph. Electronically Signed   By: Garald Balding M.D.   On: 01/19/2016 23:50    Anti-infectives: Anti-infectives    Start     Dose/Rate Route Frequency Ordered Stop   01/20/16 2000  piperacillin-tazobactam (ZOSYN) IVPB 3.375 g     3.375 g 12.5 mL/hr over 240 Minutes Intravenous 3 times per day 01/20/16 1206     01/20/16 1400  piperacillin-tazobactam (ZOSYN) IVPB 3.375 g  Status:  Discontinued     3.375 g 12.5 mL/hr over 240 Minutes Intravenous 3 times per day 01/20/16 1058 01/20/16 1154   01/20/16 1400  piperacillin-tazobactam (ZOSYN) IVPB 3.375 g  Status:   Discontinued     3.375 g 12.5 mL/hr over 240 Minutes Intravenous 3 times per day 01/20/16 1153 01/20/16 1206   01/20/16 1200  piperacillin-tazobactam (ZOSYN) IVPB 3.375 g  Status:  Discontinued     3.375 g 100 mL/hr over 30 Minutes Intravenous 4 times per day 01/20/16 1005 01/20/16 1058      Assessment/Plan: s/p * No surgery found * Assessment: Full-thickness ulceration right forefoot with no clear evidence of septic joint or osteomyelitis. Diabetes with associated neuropathy.   Plan: At bedside without the need for anesthesia due to the patient's neuropathy debridement was carried out of the ulceration on the plantar aspect of the right foot. This was excisional debridement all the way down to the deep subcutaneous tissue using a pair of sterile tissue nippers. A wet to dry dressing was packed within the wound. Spoke with care management and we will get home health care involved for dressing changes every day or 2. He will continue on oral antibiotics outpatient. Plan for follow-up in approximately 1 week for reevaluation.  LOS: 1 day    Seymore Brodowski W. 01/20/2016

## 2016-01-20 NOTE — Progress Notes (Signed)
[facility] NAME: Christian Fields    MR#:  [id number]  DATE OF BIRTH:  12/23/1955  SUBJECTIVE:  Pain in right foot mild 3/10 worse with movement relief with rest nonradiating Otherwise no complaints  REVIEW OF SYSTEMS:  CONSTITUTIONAL: No fever, fatigue or weakness.  EYES: No blurred or double vision.  EARS, NOSE, AND THROAT: No tinnitus or ear pain.  RESPIRATORY: No cough, shortness of breath, wheezing or hemoptysis.  CARDIOVASCULAR: No chest pain, orthopnea, edema.  GASTROINTESTINAL: No nausea, vomiting, diarrhea or abdominal pain.  GENITOURINARY: No dysuria, hematuria.  ENDOCRINE: No polyuria, nocturia,  HEMATOLOGY: No anemia, easy bruising or bleeding SKIN: No rash or lesion. MUSCULOSKELETAL: No joint pain or arthritis.   NEUROLOGIC: No tingling, numbness, weakness.  PSYCHIATRY: No anxiety or depression.   DRUG ALLERGIES:  No Known Allergies  VITALS:  Blood pressure 101/65, pulse 72, temperature 98.2 F (36.8 C), temperature source Axillary, resp. rate 18, height _0  (1.651 m), weight 64.774 kg (142 lb 12.8 oz), SpO2 91 %.  PHYSICAL EXAMINATION:  VITAL SIGNS: Filed Vitals:   01/20/16 0617 01/20/16 0755  BP: 105/70 101/65  Pulse: 72 72  Temp: 98.6 F (37 C) 98.2 F (36.8 C)  Resp: 18 18   GENERAL:59 y.o.male currently in no acute distress.  HEAD: Normocephalic, atraumatic.  EYES: Pupils equal, round, reactive to light. Extraocular muscles intact. No scleral icterus.  MOUTH: Moist mucosal membrane. Dentition intact. No abscess noted.  EAR, NOSE, THROAT: Clear without exudates. No external lesions.  NECK: Supple. No thyromegaly. No nodules. No JVD.  PULMONARY: Clear to ascultation, without wheeze rails or rhonci. No use of accessory muscles, Good respiratory effort. good air entry bilaterally CHEST: Nontender to palpation.  CARDIOVASCULAR: S1 and S2. Regular rate and rhythm. No murmurs, rubs, or gallops. No  edema. Pedal pulses 2+ bilaterally.  GASTROINTESTINAL: Soft, nontender, nondistended. No masses. Positive bowel sounds. No hepatosplenomegaly.  MUSCULOSKELETAL: No swelling, clubbing, or edema. Range of motion full in all extremities.  NEUROLOGIC: Cranial nerves II through XII are intact. No gross focal neurological deficits. Sensation intact. Reflexes intact.  SKIN: Ulceration base of great toe right foot currently in dressing without surrounding erythema No ulceration, lesions, rashes, or cyanosis. Skin warm and dry. Turgor intact.  PSYCHIATRIC: Mood, affect within normal limits. The patient is awake, alert and oriented x 3. Insight, judgment intact.      LABORATORY PANEL:   CBC  Recent Labs Lab 01/19/16 2100  WBC 6.7  HGB 13.1  HCT 39.2*  PLT 307   ------------------------------------------------------------------------------------------------------------------  Chemistries   Recent Labs Lab 01/19/16 2100  NA 139  K 3.1*  CL 101  CO2 28  GLUCOSE 100*  BUN 10  CREATININE 0.59*  CALCIUM 9.5  AST 17  ALT 13*  ALKPHOS 103  BILITOT 0.5   ------------------------------------------------------------------------------------------------------------------  Cardiac Enzymes No results for input(s): TROPONINI in the last 168 hours. ------------------------------------------------------------------------------------------------------------------  RADIOLOGY:  Ct Head Wo Contrast  01/19/2016  CLINICAL DATA:  Extremely drowsy. Encephalopathy, diabetes, hypertension, drug abuse. Also with chronic right great toe ulcers with debridement. EXAM: CT HEAD WITHOUT CONTRAST TECHNIQUE: Contiguous axial images were obtained from the base of the skull through the vertex without intravenous contrast. COMPARISON:  Head CT dated 03/24/2013. FINDINGS: There is generalized brain atrophy, moderate in degree for age, with commensurate dilatation of the ventricles and sulci, stable compared to the  previous exam. 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  Mild chronic small vessel ischemic changes again noted within the bilateral periventricular white matter regions. There is no mass, hemorrhage, edema or other evidence of acute parenchymal abnormality. No extra-axial hemorrhage. No acute osseous abnormality. Paranasal sinuses and mastoid air cells are clear. Superficial soft tissues are unremarkable. IMPRESSION: 1. No acute findings.  No intracranial mass, hemorrhage or edema. 2. Atrophy and chronic ischemic changes as detailed above. Electronically Signed   By: Franki Cabot M.D.   On: 01/19/2016 22:32   Dg Foot 2 Views Right  01/19/2016  CLINICAL DATA:  Ulceration along the plantar aspect of the right medial foot. Initial encounter. EXAM: RIGHT FOOT - 2 VIEW COMPARISON:  CT of the right foot performed 02/04/2007 FINDINGS: There is no evidence of fracture or dislocation. Diffuse soft tissue swelling is noted along the plantar aspect of the forefoot. Soft tissue swelling is also noted about the great toe. No definite osseous erosions are seen. Mild degenerative change is noted about the first interphalangeal joint. There is chronic flattening of the talus. A small plantar calcaneal spur is seen. IMPRESSION: No evidence of fracture or dislocation. No definite osseous erosion seen. Diffuse soft tissue swelling about the plantar aspect of the forefoot. Known ulcerations are not well characterized on radiograph. Electronically Signed   By: Garald Balding M.D.   On: 01/19/2016 23:50    EKG:   Orders placed or performed in visit on 05/17/14  . EKG 12-Lead    ASSESSMENT AND PLAN:   60 year old Caucasian gentleman history of type 2 diabetes non-insulin-requiring sent to the hospital for foot ulceration which is chronic admitted 01/19/16  1. Diabetic foot ulcer: Podiatry following input appreciated, Zosyn for antibiotic coverage, MRI pending, will check ESR 2. Hypokalemia:  Replace potassium goal 4-5 3. Type 2 diabetes insulin requiring: Hold oral agent at insulin sliding scale coverage 4. GERD without esophagitis PPI therapy 5. COPD unspecified: Spiriva 6. Venous thromboembolism prophylactic: Lovenox    All the records are reviewed and case discussed with Care Management/Social Workerr. Management plans discussed with the patient, family and they are in agreement.  CODE STATUS: Full  TOTAL TIME TAKING CARE OF THIS PATIENT: 33 minutes.   POSSIBLE D/C IN 2-3 DAYS, DEPENDING ON CLINICAL CONDITION.   Christian Fields,  Karenann Cai.D on 01/20/2016 at 11:54 AM  Between 7am to 6pm - Pager - [phone]  After 6pm: House Pager: - [phone]  [facility]  Office  [phone]  CC: Primary care physician; No PCP Per Patient

## 2016-01-20 NOTE — Consult Note (Signed)
Reason for Consult: Chronic ulceration right foot, questionable for osteomyelitis or septic joint. Referring Physician: [facility]  Christian Fields is an 60 y.o. male.  HPI: Christian Fields has a chronic history of ulceration beneath his right great toe joint. This is been going on for quite some time. Outpatient treatment with offloading and debridement has not been curative and the ulcer has proceeded down to the level of the capsular tissue beneath the right great toe joint. Decision made to admit the patient for an MRI to evaluate for joint bone involvement.  Past Medical History  Diagnosis Date  . Hypertension   . Hyperlipidemia   . Gout   . Neuropathy ([place])   . COPD (chronic obstructive pulmonary disease) ([place])   . Gout   . Sleep apnea   . Diabetes mellitus ([place])   . Asthma   . Depression   . Chronic back pain   . Kidney stones   . Arthritis     Past Surgical History  Procedure Laterality Date  . Dg feet 2 views bilat    . Appendectomy    . Other surgical history Right Foot surgery    Family History  Problem Relation Age of Onset  . Hypertension Mother     Social History:  reports that he has been smoking Cigarettes.  He has been smoking about 0.50 packs per day. He has never used smokeless tobacco. He reports that he drinks about 0.6 oz of alcohol per week. He reports that he does not use illicit drugs.  Allergies: No Known Allergies  Medications:  Scheduled: . allopurinol  100 mg Oral Daily  . aspirin EC  81 mg Oral Daily  . aspirin EC  81 mg Oral Daily  . budesonide-formoterol  2 puff Inhalation BID  . buprenorphine  8 mg Sublingual Daily  . cholecalciferol  1,000 Units Oral Daily  . clonazePAM  1 mg Oral TID  . diazepam  5 mg Oral Once  . docusate sodium  100 mg Oral BID  . DULoxetine  30 mg Oral Daily  . enoxaparin (LOVENOX) injection  40 mg Subcutaneous Q24H  . [START ON 01/21/2016] ferrous sulfate  325 mg Oral Q breakfast  . fluticasone  2  spray Each Nare Daily  . gabapentin  800 mg Oral QID  . insulin aspart  0-5 Units Subcutaneous QHS  . insulin aspart  0-9 Units Subcutaneous TID WC  . omega-3 acid ethyl esters  1 g Oral BID  . pantoprazole  40 mg Oral Daily  . potassium chloride  40 mEq Oral Once  . Saxagliptin-Metformin  1 tablet Oral BID  . simvastatin  40 mg Oral q1800  . sodium chloride flush  3 mL Intravenous Q12H  . tiotropium  1 capsule Inhalation Daily    Results for orders placed or performed during the hospital encounter of 01/19/16 (from the past 48 hour(s))  Ammonia     Status: None   Collection Time: 01/19/16  8:47 PM  Result Value Ref Range   Ammonia 28 9 - 35 umol/L  Acetaminophen level     Status: Abnormal   Collection Time: 01/19/16  8:58 PM  Result Value Ref Range   Acetaminophen (Tylenol), Serum <10 (L) 10 - 30 ug/mL    Comment:        THERAPEUTIC CONCENTRATIONS VARY SIGNIFICANTLY. A RANGE OF 10-30 ug/mL MAY BE AN EFFECTIVE CONCENTRATION FOR MANY PATIENTS. HOWEVER, SOME ARE BEST TREATED AT CONCENTRATIONS OUTSIDE THIS RANGE. ACETAMINOPHEN  CONCENTRATIONS >150 ug/mL AT 4 HOURS AFTER INGESTION AND >50 ug/mL AT 12 HOURS AFTER INGESTION ARE OFTEN ASSOCIATED WITH TOXIC REACTIONS.   CBC with Differential/Platelet     Status: Abnormal   Collection Time: 01/19/16  9:00 PM  Result Value Ref Range   WBC 6.7 3.8 - 10.6 K/uL   RBC 4.36 (L) 4.40 - 5.90 MIL/uL   Hemoglobin 13.1 13.0 - 18.0 g/dL   HCT 39.2 (L) 40.0 - 52.0 %   MCV 89.9 80.0 - 100.0 fL   MCH 30.0 26.0 - 34.0 pg   MCHC 33.4 32.0 - 36.0 g/dL   RDW 13.5 11.5 - 14.5 %   Platelets 307 150 - 440 K/uL   Neutrophils Relative % 66 %   Neutro Abs 4.4 1.4 - 6.5 K/uL   Lymphocytes Relative 21 %   Lymphs Abs 1.4 1.0 - 3.6 K/uL   Monocytes Relative 7 %   Monocytes Absolute 0.5 0.2 - 1.0 K/uL   Eosinophils Relative 5 %   Eosinophils Absolute 0.3 0 - 0.7 K/uL   Basophils Relative 1 %   Basophils Absolute 0.1 0 - 0.1 K/uL  Comprehensive  metabolic panel     Status: Abnormal   Collection Time: 01/19/16  9:00 PM  Result Value Ref Range   Sodium 139 135 - 145 mmol/L   Potassium 3.1 (L) 3.5 - 5.1 mmol/L   Chloride 101 101 - 111 mmol/L   CO2 28 22 - 32 mmol/L   Glucose, Bld 100 (H) 65 - 99 mg/dL   BUN 10 6 - 20 mg/dL   Creatinine, Ser 0.59 (L) 0.61 - 1.24 mg/dL   Calcium 9.5 8.9 - 10.3 mg/dL   Total Protein 7.2 6.5 - 8.1 g/dL   Albumin 3.6 3.5 - 5.0 g/dL   AST 17 15 - 41 U/L   ALT 13 (L) 17 - 63 U/L   Alkaline Phosphatase 103 38 - 126 U/L   Total Bilirubin 0.5 0.3 - 1.2 mg/dL   GFR calc non Af Amer >60 >60 mL/min   GFR calc Af Amer >60 >60 mL/min    Comment: (NOTE) The eGFR has been calculated using the CKD EPI equation. This calculation has not been validated in all clinical situations. eGFR's persistently <60 mL/min signify possible Chronic Kidney Disease.    Anion gap 10 5 - 15  APTT     Status: None   Collection Time: 01/19/16  9:00 PM  Result Value Ref Range   aPTT 35 24 - 36 seconds  Protime-INR     Status: None   Collection Time: 01/19/16  9:00 PM  Result Value Ref Range   Prothrombin Time 13.5 11.4 - 15.0 seconds   INR 1.01   TSH     Status: None   Collection Time: 01/19/16  9:00 PM  Result Value Ref Range   TSH 1.359 0.350 - 4.500 uIU/mL  Ethanol     Status: Abnormal   Collection Time: 01/19/16  9:00 PM  Result Value Ref Range   Alcohol, Ethyl (B) 83 (H) <5 mg/dL    Comment:        LOWEST DETECTABLE LIMIT FOR SERUM ALCOHOL IS 5 mg/dL FOR MEDICAL PURPOSES ONLY   Salicylate level     Status: None   Collection Time: 01/19/16  9:00 PM  Result Value Ref Range   Salicylate Lvl <6.4 2.8 - 30.0 mg/dL  Glucose, capillary     Status: Abnormal   Collection Time: 01/19/16  9:10 PM  Result Value Ref Range   Glucose-Capillary 128 (H) 65 - 99 mg/dL  Urine Drug Screen, Qualitative ([facility] only)     Status: Abnormal   Collection Time: 01/19/16  9:14 PM  Result Value Ref Range   Tricyclic, Ur Screen NONE  DETECTED NONE DETECTED   Amphetamines, Ur Screen POSITIVE (A) NONE DETECTED   MDMA (Ecstasy)Ur Screen NONE DETECTED NONE DETECTED   Cocaine Metabolite,Ur [place] NONE DETECTED NONE DETECTED   Opiate, Ur Screen NONE DETECTED NONE DETECTED   Phencyclidine (PCP) Ur S NONE DETECTED NONE DETECTED   Cannabinoid 50 Ng, Ur  NONE DETECTED NONE DETECTED   Barbiturates, Ur Screen NONE DETECTED NONE DETECTED   Benzodiazepine, Ur Scrn NONE DETECTED NONE DETECTED   Methadone Scn, Ur NONE DETECTED NONE DETECTED    Comment: (NOTE) 027  Tricyclics, urine               Cutoff 1000 ng/mL 200  Amphetamines, urine             Cutoff 1000 ng/mL 300  MDMA (Ecstasy), urine           Cutoff 500 ng/mL 400  Cocaine Metabolite, urine       Cutoff 300 ng/mL 500  Opiate, urine                   Cutoff 300 ng/mL 600  Phencyclidine (PCP), urine      Cutoff 25 ng/mL 700  Cannabinoid, urine              Cutoff 50 ng/mL 800  Barbiturates, urine             Cutoff 200 ng/mL 900  Benzodiazepine, urine           Cutoff 200 ng/mL 1000 Methadone, urine                Cutoff 300 ng/mL 1100 1200 The urine drug screen provides only a preliminary, unconfirmed 1300 analytical test result and should not be used for non-medical 1400 purposes. Clinical consideration and professional judgment should 1500 be applied to any positive drug screen result due to possible 1600 interfering substances. A more specific alternate chemical method 1700 must be used in order to obtain a confirmed analytical result.  1800 Gas chromato graphy / mass spectrometry (GC/MS) is the preferred 1900 confirmatory method.   Urinalysis complete, with microscopic ([facility] only)     Status: Abnormal   Collection Time: 01/19/16  9:14 PM  Result Value Ref Range   Color, Urine STRAW (A) YELLOW   APPearance CLEAR (A) CLEAR   Glucose, UA NEGATIVE NEGATIVE mg/dL   Bilirubin Urine NEGATIVE NEGATIVE   Ketones, ur NEGATIVE NEGATIVE mg/dL   Specific Gravity, Urine  1.003 (L) 1.005 - 1.030   Hgb urine dipstick NEGATIVE NEGATIVE   pH 5.0 5.0 - 8.0   Protein, ur NEGATIVE NEGATIVE mg/dL   Nitrite NEGATIVE NEGATIVE   Leukocytes, UA NEGATIVE NEGATIVE   RBC / HPF NONE SEEN 0 - 5 RBC/hpf   WBC, UA 0-5 0 - 5 WBC/hpf   Bacteria, UA NONE SEEN NONE SEEN   Squamous Epithelial / LPF 0-5 (A) NONE SEEN  Blood gas, arterial     Status: Abnormal   Collection Time: 01/19/16 10:00 PM  Result Value Ref Range   FIO2 0.28    Delivery systems NASAL CANNULA    pH, Arterial 7.36 7.350 - 7.450   pCO2 arterial 51 (H) 32.0 - 48.0 mmHg   pO2, Arterial  84 83.0 - 108.0 mmHg   Bicarbonate 28.8 (H) 21.0 - 28.0 mEq/L   Acid-Base Excess 2.5 0.0 - 3.0 mmol/L   O2 Saturation 95.9 %   Patient temperature 37.0    Collection site RIGHT RADIAL    Sample type ARTERIAL DRAW    Allens test (pass/fail) POSITIVE (A) PASS  Glucose, capillary     Status: None   Collection Time: 01/20/16  7:57 AM  Result Value Ref Range   Glucose-Capillary 76 65 - 99 mg/dL   Comment 1 Notify RN     Ct Head Wo Contrast  01/19/2016  CLINICAL DATA:  Extremely drowsy. Encephalopathy, diabetes, hypertension, drug abuse. Also with chronic right great toe ulcers with debridement. EXAM: CT HEAD WITHOUT CONTRAST TECHNIQUE: Contiguous axial images were obtained from the base of the skull through the vertex without intravenous contrast. COMPARISON:  Head CT dated 03/24/2013. FINDINGS: There is generalized brain atrophy, moderate in degree for age, with commensurate dilatation of the ventricles and sulci, stable compared to the previous exam. 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 Mild chronic small vessel ischemic changes again noted within the bilateral periventricular white matter regions. There is no mass, hemorrhage, edema or other evidence of acute parenchymal abnormality. No extra-axial hemorrhage. No acute osseous abnormality. Paranasal sinuses and mastoid air cells are clear.  Superficial soft tissues are unremarkable. IMPRESSION: 1. No acute findings.  No intracranial mass, hemorrhage or edema. 2. Atrophy and chronic ischemic changes as detailed above. Electronically Signed   By: Franki Cabot M.D.   On: 01/19/2016 22:32   Dg Foot 2 Views Right  01/19/2016  CLINICAL DATA:  Ulceration along the plantar aspect of the right medial foot. Initial encounter. EXAM: RIGHT FOOT - 2 VIEW COMPARISON:  CT of the right foot performed 02/04/2007 FINDINGS: There is no evidence of fracture or dislocation. Diffuse soft tissue swelling is noted along the plantar aspect of the forefoot. Soft tissue swelling is also noted about the great toe. No definite osseous erosions are seen. Mild degenerative change is noted about the first interphalangeal joint. There is chronic flattening of the talus. A small plantar calcaneal spur is seen. IMPRESSION: No evidence of fracture or dislocation. No definite osseous erosion seen. Diffuse soft tissue swelling about the plantar aspect of the forefoot. Known ulcerations are not well characterized on radiograph. Electronically Signed   By: Garald Balding M.D.   On: 01/19/2016 23:50    Review of Systems  Constitutional: Negative.   HENT: Negative.   Eyes: Positive for double vision.       On occasion.  Respiratory: Negative.   Cardiovascular: Negative.   Gastrointestinal: Negative.   Genitourinary: Negative.   Musculoskeletal: Negative.   Skin:       Draining open wound beneath his right great toe joint and right great toe. Also a draining wound on his left heel  Neurological:       Neuropathy related to his diabetes in the lower extremities.  Endo/Heme/Allergies: Negative.   Psychiatric/Behavioral: Negative.    Blood pressure 101/65, pulse 72, temperature 98.2 F (36.8 C), temperature source Axillary, resp. rate 18, height '5\' 5"'  (1.651 m), weight 64.774 kg (142 lb 12.8 oz), SpO2 91 %. Physical Exam  Cardiovascular:  DP pulse is palpable bilateral.  PT pulse to perform the left and trace on the right.  Musculoskeletal:  Adequate range of motion of the pedal joints. Muscle testing within normal limits. Plantar displacement of the first metatarsal on the right.  Neurological:  Loss of protective threshold with a monofilament wire bilateral. Proprioception is impaired.  Skin:  The skin is warm dry and supple. There is a full-thickness ulceration extending down to the capsular tissue beneath the right first metatarsophalangeal joint. Also a full-thickness ulceration on the plantar medial aspect of the hallux. No purulence is expressed from either of these. Some edema but minimal cellulitis. Stable ulceration on the plantar aspect of the left heel.    Assessment/Plan: Assessment: Full-thickness ulceration right forefoot with concern for joint or bone involvement. Diabetes with associated peripheral neuropathy.  Plan: Dressing applied to the wound on the right foot. We will obtain an MRI for evaluation of the joint and bone. Patient will need some type of debridement. Begin Zosyn antibiotics. We will plan accordingly for debridement following his MRI  Ernestene Coover W. 01/20/2016, 9:56 AM

## 2016-01-20 NOTE — Progress Notes (Signed)
Patient is discharged home with by mouth Augmentin by Dr. Lavetta Nielsen. But daughter called [facility] as antibiotic Augmentin prescription didn't go through to [facility]. Augmentin prescription printed again. RN Vicente Males will call the daughter to get the prescription from [facility]

## 2016-01-20 NOTE — Consult Note (Signed)
Pharmacy Antibiotic Note  Christian Fields is a 60 y.o. male admitted on 01/19/2016 with Diabetic foot ulcer.  Pharmacy has been consulted for Zosyn dosing.  Plan: Patient has been started on Zosyn 3.375 IV q8h EI. Will Continue to monitor patients renal function and make adjustments as needed.   Height: '5\' 5"'$  (165.1 cm) Weight: 142 lb 12.8 oz (64.774 kg) IBW/kg (Calculated) : 61.5  Temp (24hrs), Avg:98.3 F (36.8 C), Min:97.7 F (36.5 C), Max:98.6 F (37 C)   Recent Labs Lab 01/19/16 2100  WBC 6.7  CREATININE 0.59*    Estimated Creatinine Clearance: 85.4 mL/min (by C-G formula based on Cr of 0.59).    No Known Allergies  Antimicrobials this admission: 02/11 >> Zosyn 3.375  Dose adjustments this admission: N/A  Microbiology results: 02/10 Wound Cx: pending 02/10 UCx: pending   Thank you for allowing pharmacy to be a part of this patient's care.  Nancy Fetter, PharmD Pharmacy Resident  01/20/2016 12:03 PM

## 2016-01-20 NOTE — Plan of Care (Signed)
Problem: Education: Goal: Knowledge of [facility] Education information/materials will improve Outcome: Not Progressing Pt with RASS -4, unable to educate at this time.  Problem: Safety: Goal: Ability to remain free from injury will improve Outcome: Progressing Pt remains in bed, falls precautions activated.  Problem: Pain Managment: Goal: General experience of comfort will improve Outcome: Not Applicable Date Met:  23/36/12 Unable to assess at current moment d/t decreased LOC.  Problem: Skin Integrity: Goal: Risk for impaired skin integrity will decrease Outcome: Progressing Heels elevated off bed, pt able to turn self.  Problem: Tissue Perfusion: Goal: Risk factors for ineffective tissue perfusion will decrease Outcome: Progressing Pt started on lovenox per md order.  Problem: Activity: Goal: Risk for activity intolerance will decrease Outcome: Progressing Bedrest for now, pt able to sit at side of bed to void.

## 2016-01-20 NOTE — Discharge Summary (Signed)
[facility] [place] NAME: Roston Grunewald    MR#:  [id number]  DATE OF BIRTH:  May 12, 1956  DATE OF ADMISSION:  01/19/2016 ADMITTING PHYSICIAN: Dustin Flock, MD  DATE OF DISCHARGE: No discharge date for patient encounter.  PRIMARY CARE PHYSICIAN: No PCP Per Patient    ADMISSION DIAGNOSIS:  rt foot cellulitis  DISCHARGE DIAGNOSIS:  Active Problems:   Foot ulcer ([place])  diabetic foot ulcer without evidence of osteomyelitis  SECONDARY DIAGNOSIS:   Past Medical History  Diagnosis Date  . Hypertension   . Hyperlipidemia   . Gout   . Neuropathy ([place])   . COPD (chronic obstructive pulmonary disease) ([place])   . Gout   . Sleep apnea   . Diabetes mellitus ([place])   . Asthma   . Depression   . Chronic back pain   . Kidney stones   . Arthritis     HOSPITAL COURSE:  Sopheap Boehle  is a 60 y.o. male admitted 01/19/2016 with chief complaint diabetic foot ulcer sent in from podiatry clinic. Please see H&P performed by Dr. Posey Pronto for further information. He is admitted to the hospital started on IV antibiotic coverage obtained MRI which was fortunately negative for evidence of osteoarthritis. Patient was evaluated by podiatry and house had bedside debridement and subsequent discharge.  DISCHARGE CONDITIONS:   Improved stable  CONSULTS OBTAINED:  Treatment Team:  Sharlotte Alamo, DPM  DRUG ALLERGIES:  No Known Allergies  DISCHARGE MEDICATIONS:   Current Discharge Medication List    CONTINUE these medications which have NOT CHANGED   Details  allopurinol (ZYLOPRIM) 100 MG tablet Take 1 tablet by mouth daily. Refills: 11    aspirin EC 81 MG tablet Take 1 tablet by mouth daily.    budesonide-formoterol (SYMBICORT) 160-4.5 MCG/ACT inhaler Inhale 2 puffs into the lungs 2 (two) times daily.    cholecalciferol (VITAMIN D) 1000 units tablet Take 1,000 Units by mouth daily.    clonazePAM (KLONOPIN) 1 MG tablet Take 1 mg by mouth 3 (three)  times daily. Refills: 0    [employer] NICOTINE POLACRILEX 2 MG lozenge Take 1 lozenge by mouth every 2 (two) hours as needed for smoking cessation.  Refills: 0    DULoxetine (CYMBALTA) 30 MG capsule Take 1 capsule by mouth daily.    ferrous sulfate 325 (65 FE) MG tablet Take 325 mg by mouth daily with breakfast.    fluticasone (FLONASE) 50 MCG/ACT nasal spray Place 2 sprays into both nostrils daily. Refills: 5    gabapentin (NEURONTIN) 800 MG tablet Take 800 mg by mouth 4 (four) times daily. Refills: 0    hydrochlorothiazide (HYDRODIURIL) 25 MG tablet Take 1 tablet by mouth daily.    KOMBIGLYZE XR 2.04-999 MG TB24 Take 1 tablet by mouth 2 (two) times daily.    mupirocin cream (BACTROBAN) 2 % Apply 1 application topically 3 (three) times daily. Apply to feet Refills: 2    NICOTINE STEP 1 21 MG/24HR patch Apply 1 patch topically daily. Refills: 2    omega-3 acid ethyl esters (LOVAZA) 1 g capsule Take 1 g by mouth 2 (two) times daily.    pantoprazole (PROTONIX) 40 MG tablet Take 40 mg by mouth daily. Refills: 1    simvastatin (ZOCOR) 40 MG tablet Take 1 tablet by mouth daily at 6 PM.    SPIRIVA HANDIHALER 18 MCG inhalation capsule Place 1 capsule into inhaler and inhale daily.    SUBOXONE 8-2 MG FILM Place 1 Film  under the tongue every 8 (eight) hours. Refills: 0    amoxicillin-clavulanate (AUGMENTIN) 875-125 MG per tablet Take 1 tablet by mouth 2 (two) times daily. Qty: 20 tablet, Refills: 0      STOP taking these medications     sulfamethoxazole-trimethoprim (BACTRIM DS,SEPTRA DS) 800-160 MG tablet      predniSONE (STERAPRED UNI-PAK 21 TAB) 10 MG (21) TBPK tablet          DISCHARGE INSTRUCTIONS:    DIET:  Diabetic diet  DISCHARGE CONDITION:  Stable  ACTIVITY:  Activity as tolerated  OXYGEN:  Home Oxygen: No.   Oxygen Delivery: room air  DISCHARGE LOCATION:  home   If you experience worsening of your admission symptoms, develop shortness of breath, life  threatening emergency, suicidal or homicidal thoughts you must seek medical attention immediately by calling 911 or calling your MD immediately  if symptoms less severe.  You Must read complete instructions/literature along with all the possible adverse reactions/side effects for all the Medicines you take and that have been prescribed to you. Take any new Medicines after you have completely understood and accpet all the possible adverse reactions/side effects.   Please note  You were cared for by a hospitalist during your hospital stay. If you have any questions about your discharge medications or the care you received while you were in the hospital after you are discharged, you can call the unit and asked to speak with the hospitalist on call if the hospitalist that took care of you is not available. Once you are discharged, your primary care physician will handle any further medical issues. Please note that NO REFILLS for any discharge medications will be authorized once you are discharged, as it is imperative that you return to your primary care physician (or establish a relationship with a primary care physician if you do not have one) for your aftercare needs so that they can reassess your need for medications and monitor your lab values.    On the day of Discharge:   VITAL SIGNS:  Blood pressure 106/67, pulse 65, temperature 98.1 F (36.7 C), temperature source Oral, resp. rate 18, height '5\' 5"'$  (1.651 m), weight 64.774 kg (142 lb 12.8 oz), SpO2 98 %.  I/O:   Intake/Output Summary (Last 24 hours) at 01/20/16 1329 Last data filed at 01/20/16 0949  Gross per 24 hour  Intake 2158.33 ml  Output   1435 ml  Net 723.33 ml    PHYSICAL EXAMINATION:  GENERAL:  60 y.o.-year-old patient lying in the bed with no acute distress.  EYES: Pupils equal, round, reactive to light and accommodation. No scleral icterus. Extraocular muscles intact.  HEENT: Head atraumatic, normocephalic. Oropharynx and  nasopharynx clear.  NECK:  Supple, no jugular venous distention. No thyroid enlargement, no tenderness.  LUNGS: Normal breath sounds bilaterally, no wheezing, rales,rhonchi or crepitation. No use of accessory muscles of respiration.  CARDIOVASCULAR: S1, S2 normal. No murmurs, rubs, or gallops.  ABDOMEN: Soft, non-tender, non-distended. Bowel sounds present. No organomegaly or mass.  EXTREMITIES: No pedal edema, cyanosis, or clubbing.  NEUROLOGIC: Cranial nerves II through XII are intact. Muscle strength 5/5 in all extremities. Sensation intact. Gait not checked.  PSYCHIATRIC: The patient is alert and oriented x 3.  SKIN: No obvious rash, lesion, or ulcer.   DATA REVIEW:   CBC  Recent Labs Lab 01/19/16 2100  WBC 6.7  HGB 13.1  HCT 39.2*  PLT 307    Chemistries   Recent Labs Lab 01/19/16 2100  NA 139  K 3.1*  CL 101  CO2 28  GLUCOSE 100*  BUN 10  CREATININE 0.59*  CALCIUM 9.5  AST 17  ALT 13*  ALKPHOS 103  BILITOT 0.5    Cardiac Enzymes No results for input(s): TROPONINI in the last 168 hours.  Microbiology Results  Results for orders placed or performed during the hospital encounter of 01/19/16  Wound culture     Status: None (Preliminary result)   Collection Time: 01/19/16  3:05 PM  Result Value Ref Range Status   Specimen Description RF  Final   Special Requests NONE  Final   Gram Stain PENDING  Incomplete   Culture HOLDING FOR POSSIBLE PATHOGEN  Final   Report Status PENDING  Incomplete  Urine culture     Status: None (Preliminary result)   Collection Time: 01/19/16  9:14 PM  Result Value Ref Range Status   Specimen Description URINE, RANDOM  Final   Special Requests NONE  Final   Culture NO GROWTH < 24 HOURS  Final   Report Status PENDING  Incomplete    RADIOLOGY:  Ct Head Wo Contrast  01/19/2016  CLINICAL DATA:  Extremely drowsy. Encephalopathy, diabetes, hypertension, drug abuse. Also with chronic right great toe ulcers with debridement. EXAM: CT  HEAD WITHOUT CONTRAST TECHNIQUE: Contiguous axial images were obtained from the base of the skull through the vertex without intravenous contrast. COMPARISON:  Head CT dated 03/24/2013. FINDINGS: There is generalized brain atrophy, moderate in degree for age, with commensurate dilatation of the ventricles and sulci, stable compared to the previous exam. 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 Mild chronic small vessel ischemic changes again noted within the bilateral periventricular white matter regions. There is no mass, hemorrhage, edema or other evidence of acute parenchymal abnormality. No extra-axial hemorrhage. No acute osseous abnormality. Paranasal sinuses and mastoid air cells are clear. Superficial soft tissues are unremarkable. IMPRESSION: 1. No acute findings.  No intracranial mass, hemorrhage or edema. 2. Atrophy and chronic ischemic changes as detailed above. Electronically Signed   By: Franki Cabot M.D.   On: 01/19/2016 22:32   Mr Foot Right Wo Contrast  01/20/2016  CLINICAL DATA:  60 year old diabetic with chronic great toe ulceration unresponsive to debridement, admitted to hospital for osteomyelitis. EXAM: MRI OF THE RIGHT FOREFOOT WITHOUT CONTRAST TECHNIQUE: Multiplanar, multisequence MR imaging was performed. No intravenous contrast was administered. COMPARISON:  Foot radiographs 01/19/2016. MRI of the ankle 09/19/2006. FINDINGS: There is a 12 mm soft tissue defect over the plantar aspect of the first metatarsal phalangeal joint, presumably a surgical defect. The surrounding subcutaneous fat demonstrates diffusely decreased T1 and increased T2 signal without focal fluid collection on noncontrast imaging. These signal abnormalities extend into the medial aspect of the great toe. There is mild nonspecific subcutaneous edema and muscular T2 hyperintensity throughout the forefoot. There are degenerative changes at the first metatarsal phalangeal joint with osteophytes  and subchondral cyst formation. There is a small effusion of the first metatarsal phalangeal joint with synovial thickening suspicious for synovitis. There is no bone destruction or subchondral edema to suggest osteomyelitis. Mild chronic deformity of the distal first phalanx appears stable. The additional toes, metatarsal phalangeal joints and metatarsals appear normal. The alignment is normal at the Lisfranc joint. IMPRESSION: 1. Ill-defined inflammatory changes surrounding the ulcer or surgical defect over the plantar aspect of the first metatarsal phalangeal joint. No drainable abscess demonstrated. 2. No specific signs of osteomyelitis. 3. Underlying first MTP degenerative changes with  possible synovitis. No specific signs of septic joint. Electronically Signed   By: Richardean Sale M.D.   On: 01/20/2016 12:15   Dg Foot 2 Views Right  01/19/2016  CLINICAL DATA:  Ulceration along the plantar aspect of the right medial foot. Initial encounter. EXAM: RIGHT FOOT - 2 VIEW COMPARISON:  CT of the right foot performed 02/04/2007 FINDINGS: There is no evidence of fracture or dislocation. Diffuse soft tissue swelling is noted along the plantar aspect of the forefoot. Soft tissue swelling is also noted about the great toe. No definite osseous erosions are seen. Mild degenerative change is noted about the first interphalangeal joint. There is chronic flattening of the talus. A small plantar calcaneal spur is seen. IMPRESSION: No evidence of fracture or dislocation. No definite osseous erosion seen. Diffuse soft tissue swelling about the plantar aspect of the forefoot. Known ulcerations are not well characterized on radiograph. Electronically Signed   By: Garald Balding M.D.   On: 01/19/2016 23:50     Management plans discussed with the patient, family and they are in agreement.  CODE STATUS:     Code Status Orders        Start     Ordered   01/19/16 2021  Full code   Continuous     01/19/16 2021    Code  Status History    Date Active Date Inactive Code Status Order ID Comments User Context   This patient has a current code status but no historical code status.      TOTAL TIME TAKING CARE OF THIS PATIENT: 28 minutes.    Hower,  Karenann Cai.D on 01/20/2016 at 1:29 PM  Between 7am to 6pm - Pager - [phone]  After 6pm go to [email or web address] - password [email or web address] [facility]  [facility]  Office  [phone]  CC: Primary care physician; No PCP Per Patient

## 2016-01-21 LAB — URINE CULTURE: Culture: NO GROWTH

## 2016-01-23 LAB — WOUND CULTURE

## 2016-12-15 DIAGNOSIS — R400 Somnolence: Secondary | ICD-10-CM | POA: Insufficient documentation

## 2016-12-15 DIAGNOSIS — Z79899 Other long term (current) drug therapy: Secondary | ICD-10-CM | POA: Insufficient documentation

## 2016-12-15 DIAGNOSIS — G8929 Other chronic pain: Secondary | ICD-10-CM | POA: Insufficient documentation

## 2016-12-15 DIAGNOSIS — J9621 Acute and chronic respiratory failure with hypoxia: Secondary | ICD-10-CM | POA: Insufficient documentation

## 2016-12-15 DIAGNOSIS — J9622 Acute and chronic respiratory failure with hypercapnia: Secondary | ICD-10-CM | POA: Insufficient documentation

## 2016-12-15 DIAGNOSIS — Z87442 Personal history of urinary calculi: Secondary | ICD-10-CM | POA: Insufficient documentation

## 2016-12-15 DIAGNOSIS — Z79891 Long term (current) use of opiate analgesic: Secondary | ICD-10-CM | POA: Insufficient documentation

## 2016-12-15 DIAGNOSIS — J189 Pneumonia, unspecified organism: Secondary | ICD-10-CM | POA: Insufficient documentation

## 2016-12-15 DIAGNOSIS — G894 Chronic pain syndrome: Secondary | ICD-10-CM | POA: Insufficient documentation

## 2017-05-12 ENCOUNTER — Emergency Department
Admission: EM | Admit: 2017-05-12 | Discharge: 2017-05-12 | Disposition: A | Discharge status: Home / Self Care | Payer: Medicare Other | Attending: Emergency Medicine | Admitting: Emergency Medicine

## 2017-05-12 ENCOUNTER — Emergency Department: Payer: Medicare Other

## 2017-05-12 DIAGNOSIS — J181 Lobar pneumonia, unspecified organism: Secondary | ICD-10-CM | POA: Diagnosis not present

## 2017-05-12 DIAGNOSIS — R0602 Shortness of breath: Secondary | ICD-10-CM | POA: Diagnosis present

## 2017-05-12 DIAGNOSIS — Z79899 Other long term (current) drug therapy: Secondary | ICD-10-CM | POA: Diagnosis not present

## 2017-05-12 DIAGNOSIS — F17210 Nicotine dependence, cigarettes, uncomplicated: Secondary | ICD-10-CM | POA: Insufficient documentation

## 2017-05-12 DIAGNOSIS — J449 Chronic obstructive pulmonary disease, unspecified: Secondary | ICD-10-CM | POA: Insufficient documentation

## 2017-05-12 DIAGNOSIS — I10 Essential (primary) hypertension: Secondary | ICD-10-CM | POA: Insufficient documentation

## 2017-05-12 DIAGNOSIS — J45909 Unspecified asthma, uncomplicated: Secondary | ICD-10-CM | POA: Insufficient documentation

## 2017-05-12 DIAGNOSIS — J189 Pneumonia, unspecified organism: Secondary | ICD-10-CM

## 2017-05-12 DIAGNOSIS — E119 Type 2 diabetes mellitus without complications: Secondary | ICD-10-CM | POA: Insufficient documentation

## 2017-05-12 DIAGNOSIS — Z7982 Long term (current) use of aspirin: Secondary | ICD-10-CM | POA: Insufficient documentation

## 2017-05-12 LAB — BASIC METABOLIC PANEL
Anion gap: 13 (ref 5–15)
BUN: 13 mg/dL (ref 6–20)
CO2: 29 mmol/L (ref 22–32)
Calcium: 9.9 mg/dL (ref 8.9–10.3)
Chloride: 95 mmol/L — ABNORMAL LOW (ref 101–111)
Creatinine, Ser: 0.64 mg/dL (ref 0.61–1.24)
GFR calc Af Amer: >60 mL/min (ref >60)
GFR calc non Af Amer: >60 mL/min (ref >60)
Glucose, Bld: 125 mg/dL — ABNORMAL HIGH (ref 65–99)
Potassium: 3.4 mmol/L — ABNORMAL LOW (ref 3.5–5.1)
Sodium: 137 mmol/L (ref 135–145)

## 2017-05-12 LAB — CBC
HCT: 40.9 % (ref 40.0–52.0)
Hemoglobin: 13.9 g/dL (ref 13.0–18.0)
MCH: 31 pg (ref 26.0–34.0)
MCHC: 34.1 g/dL (ref 32.0–36.0)
MCV: 91 fL (ref 80.0–100.0)
Platelets: 171 10*3/uL (ref 150–440)
RBC: 4.5 MIL/uL (ref 4.40–5.90)
RDW: 13.3 % (ref 11.5–14.5)
WBC: 8.8 10*3/uL (ref 3.8–10.6)

## 2017-05-12 LAB — TROPONIN I: Troponin I: <0.03 ng/mL (ref <0.03)

## 2017-05-12 MED ORDER — IPRATROPIUM-ALBUTEROL 0.5-2.5 (3) MG/3ML IN SOLN
3.0000 mL | Freq: Once | RESPIRATORY_TRACT | Status: AC
  Administered 2017-05-12: 3 mL via RESPIRATORY_TRACT
  Filled 2017-05-12: qty 3

## 2017-05-12 MED ORDER — PREDNISONE 10 MG (21) PO TBPK: ORAL_TABLET | ORAL | 0 refills | Status: DC

## 2017-05-12 MED ORDER — LEVOFLOXACIN IN D5W 750 MG/150ML IV SOLN
750.0000 mg | Freq: Once | INTRAVENOUS | Status: AC
  Administered 2017-05-12: 750 mg via INTRAVENOUS
  Filled 2017-05-12: qty 150

## 2017-05-12 MED ORDER — LEVOFLOXACIN 750 MG PO TABS: 750.0000 mg | ORAL_TABLET | Freq: Every day | ORAL | 0 refills | Status: DC

## 2017-05-12 MED ORDER — METHYLPREDNISOLONE SODIUM SUCC 125 MG IJ SOLR
125.0000 mg | Freq: Once | INTRAMUSCULAR | Status: AC
  Administered 2017-05-12: 125 mg via INTRAVENOUS
  Filled 2017-05-12: qty 2

## 2017-05-12 NOTE — ED Notes (Signed)
Patient was up for d/c for a while. Patient was very eager to get home. Went into room patient was changed and un hooked. Patient discharged. Patient stated he had O2 in his car. Patient wheeled to front circle.

## 2017-05-12 NOTE — ED Triage Notes (Signed)
PT sent over from podiatrists office for low oxygen. PT wears 2L [place] chronically. PT on 3L at this time. Reports mild nonproductive cough, SOB with exertion which is normal for patient.

## 2017-05-12 NOTE — ED Provider Notes (Signed)
[facility] Emergency Department Provider Note       Time seen: ----------------------------------------- 3:08 PM on 05/12/2017 -----------------------------------------     I have reviewed the triage vital signs and the nursing notes.   HISTORY   Chief Complaint Shortness of Breath    HPI Christian Fields is a 61 y.o. male who presents to the ED for low oxygen. Patient normally wears 2 L of nasal cannula oxygen chronically. He is on 3 L at this time. Patient reports a mild nonproductive cough. He was sent here from a podiatrist's office for low oxygen. Patient denies any recent illness, fevers, chills or other complaints.   Past Medical History:  Diagnosis Date  . Arthritis   . Asthma   . Chronic back pain   . COPD (chronic obstructive pulmonary disease) ([place])   . Depression   . Diabetes mellitus ([place])   . Gout   . Gout   . Hyperlipidemia   . Hypertension   . Kidney stones   . Neuropathy   . Sleep apnea     Patient Active Problem List   Diagnosis Date Noted  . Foot ulcer ([place]) 01/19/2016    Past Surgical History:  Procedure Laterality Date  . APPENDECTOMY    . DG FEET 2 VIEWS BILAT    . OTHER SURGICAL HISTORY Right Foot surgery    Allergies Patient has no known allergies.  Social History Social History  Substance Use Topics  . Smoking status: Current Every Day Smoker    Packs/day: 0.50    Types: Cigarettes  . Smokeless tobacco: Never Used  . Alcohol use 0.6 oz/week    1 Cans of beer per week    Review of Systems Constitutional: Negative for fever. Eyes: Negative for vision changes ENT:  Negative for congestion, sore throat Cardiovascular: Negative for chest pain. Respiratory: Positive for chronic shortness of breath Gastrointestinal: Negative for abdominal pain, vomiting. Positive for mild diarrhea Genitourinary: Negative for dysuria. Musculoskeletal: Negative for back pain. Skin: Negative for rash. Neurological:  Negative for headaches, focal weakness or numbness.  All systems negative/normal/unremarkable except as stated in the HPI  ____________________________________________   PHYSICAL EXAM:  VITAL SIGNS: ED Triage Vitals [05/12/17 1453]  Enc Vitals Group     BP      Pulse Rate (!) 110     Resp (!) 25     Temp 98.3 F (36.8 C)     Temp Source Oral     SpO2 (!) 89 %     Weight 150 lb (68 kg)     Height 5\' 8"  (1.727 m)     Head Circumference      Peak Flow      Pain Score      Pain Loc      Pain Edu?      Excl. in [place]?     Constitutional: Alert and oriented. Well appearing and in no distress. Eyes: Conjunctivae are normal. Normal extraocular movements. ENT   Head: Normocephalic and atraumatic.   Nose: No congestion/rhinnorhea.   Mouth/Throat: Mucous membranes are moist.   Neck: No stridor. Cardiovascular: Normal rate, regular rhythm. No murmurs, rubs, or gallops. Respiratory: Normal respiratory effort without tachypnea nor retractions. Bilateral wheezing with scattered rhonchi Gastrointestinal: Soft and nontender. Normal bowel sounds Musculoskeletal: Nontender with normal range of motion in extremities. No lower extremity tenderness nor edema. Neurologic:  Normal speech and language. No gross focal neurologic deficits are appreciated.  Skin:  Skin is warm, dry and  intact. No rash noted. Psychiatric: Mood and affect are normal. Speech and behavior are normal.  ____________________________________________  EKG: Interpreted by me. Sinus tachycardia with PACs, rate is 112 bpm, normal PR interval, left anterior fascicular block, inferior infarct age indeterminate, possible septal infarct age indeterminate, normal QT.  ____________________________________________  ED COURSE:  Pertinent labs & imaging results that were available during my care of the patient were reviewed by me and considered in my medical decision making (see chart for details). Patient presents for  hypoxia, we will assess with labs and imaging as indicated.   Procedures ____________________________________________   LABS (pertinent positives/negatives)  Labs Reviewed  BASIC METABOLIC PANEL - Abnormal; Notable for the following:       Result Value   Potassium 3.4 (*)    Chloride 95 (*)    Glucose, Bld 125 (*)    All other components within normal limits  CBC  TROPONIN I    RADIOLOGY Images were viewed by me  Chest x-ray   IMPRESSION: Asymmetric interstitial markings at the right base, possible early bronchopneumonia. Followup PA and lateral chest X-ray is recommended in 3-4 weeks to ensure normalization.  COPD. ____________________________________________  FINAL ASSESSMENT AND PLAN  Hypoxia, COPD exacerbation  Plan: Patient's labs and imaging were dictated above. Patient had presented for Increased oxygen requirement. This is subacute onset and likely reflects underlying COPD. Today he may have evidence of developing bronchopneumonia for which she we have started him on Levaquin. He'll be discharged with oral Levaquin and advised to have close outpatient follow-up with his pulmonologist.   Earleen Newport, MD   Note: This note was generated in part or whole with voice recognition software. Voice recognition is usually quite accurate but there are transcription errors that can and very often do occur. I apologize for any typographical errors that were not detected and corrected.     Earleen Newport, MD 05/12/17 [phone]

## 2017-06-08 DIAGNOSIS — J189 Pneumonia, unspecified organism: Secondary | ICD-10-CM

## 2017-06-08 HISTORY — DX: Pneumonia, unspecified organism: J18.9

## 2017-06-19 DIAGNOSIS — R0602 Shortness of breath: Secondary | ICD-10-CM | POA: Insufficient documentation

## 2017-07-08 ENCOUNTER — Ambulatory Visit (INDEPENDENT_AMBULATORY_CARE_PROVIDER_SITE_OTHER): Payer: Medicare Other | Admitting: Vascular Surgery

## 2017-07-08 VITALS — BP 102/63 | HR 107 | Resp 18 | Ht 68.0 in | Wt 146.0 lb

## 2017-07-08 DIAGNOSIS — F17210 Nicotine dependence, cigarettes, uncomplicated: Secondary | ICD-10-CM

## 2017-07-08 DIAGNOSIS — I70212 Atherosclerosis of native arteries of extremities with intermittent claudication, left leg: Secondary | ICD-10-CM | POA: Diagnosis not present

## 2017-07-08 DIAGNOSIS — E785 Hyperlipidemia, unspecified: Secondary | ICD-10-CM | POA: Diagnosis not present

## 2017-07-08 DIAGNOSIS — I10 Essential (primary) hypertension: Secondary | ICD-10-CM | POA: Insufficient documentation

## 2017-07-08 DIAGNOSIS — E1151 Type 2 diabetes mellitus with diabetic peripheral angiopathy without gangrene: Secondary | ICD-10-CM

## 2017-07-08 DIAGNOSIS — E119 Type 2 diabetes mellitus without complications: Secondary | ICD-10-CM | POA: Insufficient documentation

## 2017-07-08 DIAGNOSIS — F17200 Nicotine dependence, unspecified, uncomplicated: Secondary | ICD-10-CM | POA: Insufficient documentation

## 2017-07-08 DIAGNOSIS — I70219 Atherosclerosis of native arteries of extremities with intermittent claudication, unspecified extremity: Secondary | ICD-10-CM | POA: Insufficient documentation

## 2017-07-08 NOTE — Assessment & Plan Note (Signed)
We had a discussion for approximately 3-4 minutes regarding the absolute need for smoking cessation due to the deleterious nature of tobacco on the vascular system. We discussed the tobacco use would diminish patency of any intervention, and likely significantly worsen progressio of disease. We discussed multiple agents for quitting including replacement therapy or medications to reduce cravings such as Chantix. The patient voices their understanding of the importance of smoking cessation.

## 2017-07-08 NOTE — Assessment & Plan Note (Signed)
blood glucose control important in reducing the progression of atherosclerotic disease. Also, involved in wound healing. On appropriate medications.  

## 2017-07-08 NOTE — Assessment & Plan Note (Signed)
lipid control important in reducing the progression of atherosclerotic disease. Continue statin therapy  

## 2017-07-08 NOTE — Progress Notes (Signed)
Patient ID: Christian Fields, male   DOB: Jul 22, 1956, 61 y.o.   MRN: [id number]  Chief Complaint  Patient presents with  . New Evaluation    ASO of the left sub femoral artery    HPI Christian Fields is a 61 y.o. male.  I am asked to see the patient by C. Lavena Bullion, NP for evaluation of PAD.  The patient reports left leg pain with activity.  He has chronic back issues and had largely attributed his pain to back problems previously. The pain has been steadily worsening over months to years.  No clear inciting event or causative factor that started the pain. He denies current ulcerations but has had slow to heal ulcerations on both lower extremities previously. This was attributed to diabetes. He reports no pain wakes him at night. The pain does limit his walking to short distances. The left leg is far more affected than the right leg. A recent duplex suggested partially occlusive thrombus in the left superficial femoral vein in the lower thigh, although the chronicity of this was not really well delineated. Incidental finding of severe atherosclerotic changes in the left superficial femoral artery were also made. He is referred for further evaluation and treatment.   Past Medical History:  Diagnosis Date  . Arthritis   . Asthma   . Chronic back pain   . COPD (chronic obstructive pulmonary disease) ([place])   . Depression   . Diabetes mellitus ([place])   . Gout   . Gout   . Hyperlipidemia   . Hypertension   . Kidney stones   . Neuropathy   . Sleep apnea     Past Surgical History:  Procedure Laterality Date  . APPENDECTOMY    . DG FEET 2 VIEWS BILAT    . OTHER SURGICAL HISTORY Right Foot surgery    Family History  Problem Relation Age of Onset  . Hypertension Mother   No bleeding disorders, clotting disorders, autoimmune diseases, or aneurysms  Social History Social History  Substance Use Topics  . Smoking status: Current Every Day Smoker    Packs/day: 0.50    Types: Cigarettes  .  Smokeless tobacco: Never Used  . Alcohol use 0.6 oz/week    1 Cans of beer per week  No IV drug use  Allergies  Allergen Reactions  . Bee Venom Anaphylaxis  . Hydrocodone-Acetaminophen Itching    Current Outpatient Prescriptions  Medication Sig Dispense Refill  . albuterol (PROVENTIL) (2.5 MG/3ML) 0.083% nebulizer solution 2.5 mg.    . alendronate (FOSAMAX) 70 MG tablet     . allopurinol (ZYLOPRIM) 100 MG tablet Take 1 tablet by mouth daily.  11  . amoxicillin-clavulanate (AUGMENTIN) 875-125 MG tablet Take 1 tablet by mouth 2 (two) times daily. 20 tablet 0  . aspirin EC 81 MG tablet Take 1 tablet by mouth daily.    . budesonide-formoterol (SYMBICORT) 160-4.5 MCG/ACT inhaler Inhale 2 puffs into the lungs 2 (two) times daily.    . cholecalciferol (VITAMIN D) 1000 units tablet Take 1,000 Units by mouth daily.    . clonazePAM (KLONOPIN) 1 MG tablet Take 1 mg by mouth 3 (three) times daily.  0  . [employer] NICOTINE POLACRILEX 2 MG lozenge Take 1 lozenge by mouth every 2 (two) hours as needed for smoking cessation.   0  . DULoxetine (CYMBALTA) 30 MG capsule Take 1 capsule by mouth daily.    . ferrous sulfate 325 (65 FE) MG tablet Take 325 mg by mouth  daily with breakfast.    . fluticasone (FLONASE) 50 MCG/ACT nasal spray Place 2 sprays into both nostrils daily.  5  . gabapentin (NEURONTIN) 800 MG tablet Take 800 mg by mouth 4 (four) times daily.  0  . gemfibrozil (LOPID) 600 MG tablet     . hydrochlorothiazide (HYDRODIURIL) 25 MG tablet Take 1 tablet by mouth daily.    [name] Kitchen ipratropium (ATROVENT) 0.02 % nebulizer solution Inhale 2.5 mL (500 mcg total) by nebulization every 6 (six) hours.    [name] Kitchen KOMBIGLYZE XR 2.04-999 MG TB24 Take 1 tablet by mouth 2 (two) times daily.    [name] Kitchen levofloxacin (LEVAQUIN) 750 MG tablet Take 1 tablet (750 mg total) by mouth daily. 5 tablet 0  . mupirocin cream (BACTROBAN) 2 % Apply 1 application topically 3 (three) times daily. Apply to feet  2  . NICOTINE STEP 1 21 MG/24HR  patch Apply 1 patch topically daily.  2  . omega-3 acid ethyl esters (LOVAZA) 1 g capsule Take 1 g by mouth 2 (two) times daily.    . pantoprazole (PROTONIX) 40 MG tablet Take 40 mg by mouth daily.  1  . PRADAXA 150 MG CAPS capsule     . predniSONE (STERAPRED UNI-PAK 21 TAB) 10 MG (21) TBPK tablet Dispense steroid taper pack as directed 21 tablet 0  . simvastatin (ZOCOR) 40 MG tablet Take 1 tablet by mouth daily at 6 PM.    . SPIRIVA HANDIHALER 18 MCG inhalation capsule Place 1 capsule into inhaler and inhale daily.    . SUBOXONE 8-2 MG FILM Place 1 Film under the tongue every 8 (eight) hours.  0   No current facility-administered medications for this visit.       REVIEW OF SYSTEMS (Negative unless checked)  Constitutional: '[]' Weight loss  '[]' Fever  '[]' Chills Cardiac: '[]' Chest pain   '[]' Chest pressure   '[]' Palpitations   '[]' Shortness of breath when laying flat   '[]' Shortness of breath at rest   '[x]' Shortness of breath with exertion. Vascular:  '[x]' Pain in legs with walking   '[]' Pain in legs at rest   '[]' Pain in legs when laying flat   '[x]' Claudication   '[]' Pain in feet when walking  '[]' Pain in feet at rest  '[]' Pain in feet when laying flat   '[]' History of DVT   '[]' Phlebitis   '[]' Swelling in legs   '[]' Varicose veins   '[]' Non-healing ulcers Pulmonary:   '[]' Uses home oxygen   '[]' Productive cough   '[]' Hemoptysis   '[]' Wheeze  '[x]' COPD   '[]' Asthma Neurologic:  '[]' Dizziness  '[]' Blackouts   '[]' Seizures   '[]' History of stroke   '[]' History of TIA  '[]' Aphasia   '[]' Temporary blindness   '[]' Dysphagia   '[]' Weakness or numbness in arms   '[]' Weakness or numbness in legs Musculoskeletal:  '[x]' Arthritis   '[]' Joint swelling   '[]' Joint pain   '[x]' Low back pain Hematologic:  '[]' Easy bruising  '[]' Easy bleeding   '[]' Hypercoagulable state   '[]' Anemic  '[]' Hepatitis Gastrointestinal:  '[]' Blood in stool   '[]' Vomiting blood  '[]' Gastroesophageal reflux/heartburn   '[]' Abdominal pain Genitourinary:  '[]' Chronic kidney disease   '[]' Difficult urination  '[]' Frequent urination   '[]' Burning with urination   '[]' Hematuria Skin:  '[]' Rashes   '[]' Ulcers   '[]' Wounds Psychological:  '[]' History of anxiety   '[]'  History of major depression.    Physical Exam BP 102/63 (BP Location: Right Arm)   Pulse (!) 107   Resp 18   Ht '5\' 8"'  (1.727 m)   Wt 146 lb (66.2 kg)   BMI 22.20 kg/m  Gen:  WD/WN, NAD Head: [place]/AT, No temporalis wasting. Ear/Nose/Throat: Hearing grossly intact, nares w/o erythema or drainage, oropharynx w/o Erythema/Exudate Eyes: Conjunctiva clear, sclera non-icteric  Neck: trachea midline.  No JVD.  Pulmonary:  Good air movement, respirations not labored on supplemental oxygen Cardiac: RRR, normal S1, S2 Vascular:  Vessel Right Left  Radial Palpable Palpable                          PT 1+ Palpable Trace Palpable  DP 2+ Palpable 1+ Palpable   Gastrointestinal: soft, non-tender/non-distended.  Musculoskeletal: M/S 5/5 throughout.  Extremities without ischemic changes.  No deformity or atrophy. 1-2+ left lower extremity edema. Trace right lower extremity edema Neurologic: Sensation grossly intact in extremities.  Symmetrical.  Speech is fluent. Motor exam as listed above. Psychiatric: Judgment intact, Mood & affect appropriate for pt's clinical situation. Dermatologic: No rashes or ulcers noted.  No cellulitis or open wounds.    Radiology No results found.  Labs Recent Results (from the past 2160 hour(s))  Basic metabolic panel     Status: Abnormal   Collection Time: 05/12/17  2:53 PM  Result Value Ref Range   Sodium 137 135 - 145 mmol/L   Potassium 3.4 (L) 3.5 - 5.1 mmol/L   Chloride 95 (L) 101 - 111 mmol/L   CO2 29 22 - 32 mmol/L   Glucose, Bld 125 (H) 65 - 99 mg/dL   BUN 13 6 - 20 mg/dL   Creatinine, Ser 0.64 0.61 - 1.24 mg/dL   Calcium 9.9 8.9 - 10.3 mg/dL   GFR calc non Af Amer >60 >60 mL/min   GFR calc Af Amer >60 >60 mL/min    Comment: (NOTE) The eGFR has been calculated using the CKD EPI equation. This calculation has not been  validated in all clinical situations. eGFR's persistently <60 mL/min signify possible Chronic Kidney Disease.    Anion gap 13 5 - 15  CBC     Status: None   Collection Time: 05/12/17  2:53 PM  Result Value Ref Range   WBC 8.8 3.8 - 10.6 K/uL   RBC 4.50 4.40 - 5.90 MIL/uL   Hemoglobin 13.9 13.0 - 18.0 g/dL   HCT 40.9 40.0 - 52.0 %   MCV 91.0 80.0 - 100.0 fL   MCH 31.0 26.0 - 34.0 pg   MCHC 34.1 32.0 - 36.0 g/dL   RDW 13.3 11.5 - 14.5 %   Platelets 171 150 - 440 K/uL  Troponin I     Status: None   Collection Time: 05/12/17  2:53 PM  Result Value Ref Range   Troponin I <0.03 <0.03 ng/mL    Assessment/Plan:  Hypertension blood pressure control important in reducing the progression of atherosclerotic disease. On appropriate oral medications.   Diabetes mellitus ([place]) blood glucose control important in reducing the progression of atherosclerotic disease. Also, involved in wound healing. On appropriate medications.   Hyperlipidemia lipid control important in reducing the progression of atherosclerotic disease. Continue statin therapy   Tobacco use disorder We had a discussion for approximately 3-4 minutes regarding the absolute need for smoking cessation due to the deleterious nature of tobacco on the vascular system. We discussed the tobacco use would diminish patency of any intervention, and likely significantly worsen progressio of disease. We discussed multiple agents for quitting including replacement therapy or medications to reduce cravings such as Chantix. The patient voices their understanding of the importance of smoking cessation.   Atherosclerosis of native arteries  of extremity with intermittent claudication (HCC)  Recommend:  The patient has evidence of atherosclerosis of the lower extremities with claudication.  The patient does not voice lifestyle limiting changes at this point in time, although his symptoms are close to that. He was offered further evaluation  versus serial follow-up with a dedicated exercise regimen and smoking cessation. He prefers the latter. We will plan on repeat noninvasive studies in 3-4 months.   No invasive studies, angiography or surgery at this time The patient should continue walking and begin a more formal exercise program.  The patient should continue antiplatelet therapy and aggressive treatment of the lipid abnormalities  No changes in the patient's medications at this time  The patient should continue wearing graduated compression socks 10-15 mmHg strength to control the mild edema.        Leotis Pain 07/08/2017, 3:47 PM   This note was created with Dragon medical transcription system.  Any errors from dictation are unintentional.

## 2017-07-08 NOTE — Assessment & Plan Note (Signed)
Recommend:  The patient has evidence of atherosclerosis of the lower extremities with claudication.  The patient does not voice lifestyle limiting changes at this point in time, although his symptoms are close to that. He was offered further evaluation versus serial follow-up with a dedicated exercise regimen and smoking cessation. He prefers the latter. We will plan on repeat noninvasive studies in 3-4 months.   No invasive studies, angiography or surgery at this time The patient should continue walking and begin a more formal exercise program.  The patient should continue antiplatelet therapy and aggressive treatment of the lipid abnormalities  No changes in the patient's medications at this time  The patient should continue wearing graduated compression socks 10-15 mmHg strength to control the mild edema.

## 2017-07-08 NOTE — Assessment & Plan Note (Signed)
blood pressure control important in reducing the progression of atherosclerotic disease. On appropriate oral medications.  

## 2017-07-08 NOTE — Patient Instructions (Signed)

## 2017-07-09 ENCOUNTER — Encounter (INDEPENDENT_AMBULATORY_CARE_PROVIDER_SITE_OTHER): Payer: Self-pay

## 2017-08-13 ENCOUNTER — Encounter
Admission: RE | Admit: 2017-08-13 | Discharge: 2017-08-13 | Disposition: A | Discharge status: Home / Self Care | Payer: Medicare Other | Source: Ambulatory Visit | Attending: Podiatry | Admitting: Podiatry

## 2017-08-13 DIAGNOSIS — F17200 Nicotine dependence, unspecified, uncomplicated: Secondary | ICD-10-CM | POA: Diagnosis not present

## 2017-08-13 DIAGNOSIS — J45909 Unspecified asthma, uncomplicated: Secondary | ICD-10-CM | POA: Diagnosis not present

## 2017-08-13 DIAGNOSIS — F418 Other specified anxiety disorders: Secondary | ICD-10-CM | POA: Diagnosis not present

## 2017-08-13 DIAGNOSIS — I10 Essential (primary) hypertension: Secondary | ICD-10-CM | POA: Diagnosis not present

## 2017-08-13 DIAGNOSIS — K219 Gastro-esophageal reflux disease without esophagitis: Secondary | ICD-10-CM | POA: Diagnosis not present

## 2017-08-13 DIAGNOSIS — Z885 Allergy status to narcotic agent status: Secondary | ICD-10-CM | POA: Diagnosis not present

## 2017-08-13 DIAGNOSIS — Z91030 Bee allergy status: Secondary | ICD-10-CM | POA: Diagnosis not present

## 2017-08-13 DIAGNOSIS — N289 Disorder of kidney and ureter, unspecified: Secondary | ICD-10-CM | POA: Diagnosis not present

## 2017-08-13 DIAGNOSIS — M71371 Other bursal cyst, right ankle and foot: Secondary | ICD-10-CM | POA: Diagnosis present

## 2017-08-13 DIAGNOSIS — D649 Anemia, unspecified: Secondary | ICD-10-CM | POA: Diagnosis not present

## 2017-08-13 DIAGNOSIS — M1990 Unspecified osteoarthritis, unspecified site: Secondary | ICD-10-CM | POA: Diagnosis not present

## 2017-08-13 DIAGNOSIS — E118 Type 2 diabetes mellitus with unspecified complications: Secondary | ICD-10-CM | POA: Diagnosis not present

## 2017-08-13 DIAGNOSIS — R51 Headache: Secondary | ICD-10-CM | POA: Diagnosis not present

## 2017-08-13 HISTORY — DX: Personal history of other venous thrombosis and embolism: Z86.718

## 2017-08-13 HISTORY — DX: Anxiety disorder, unspecified: F41.9

## 2017-08-13 HISTORY — DX: Dependence on supplemental oxygen: Z99.81

## 2017-08-13 HISTORY — DX: Dyspnea, unspecified: R06.00

## 2017-08-13 HISTORY — DX: Headache: R51

## 2017-08-13 HISTORY — DX: Personal history of urinary calculi: Z87.442

## 2017-08-13 HISTORY — DX: Headache, unspecified: R51.9

## 2017-08-13 HISTORY — DX: Malignant (primary) neoplasm, unspecified: C80.1

## 2017-08-13 HISTORY — DX: Gastro-esophageal reflux disease without esophagitis: K21.9

## 2017-08-13 HISTORY — DX: Anemia, unspecified: D64.9

## 2017-08-13 LAB — DIFFERENTIAL
Basophils Absolute: 0.1 10*3/uL (ref 0–0.1)
Basophils Relative: 1 %
Eosinophils Absolute: 0.2 10*3/uL (ref 0–0.7)
Eosinophils Relative: 3 %
Lymphocytes Relative: 20 %
Lymphs Abs: 1.8 10*3/uL (ref 1.0–3.6)
Monocytes Absolute: 0.6 10*3/uL (ref 0.2–1.0)
Monocytes Relative: 7 %
Neutro Abs: 6.4 10*3/uL (ref 1.4–6.5)
Neutrophils Relative %: 69 %

## 2017-08-13 LAB — BASIC METABOLIC PANEL
Anion gap: 11 (ref 5–15)
BUN: 16 mg/dL (ref 6–20)
CO2: 33 mmol/L — ABNORMAL HIGH (ref 22–32)
Calcium: 9.7 mg/dL (ref 8.9–10.3)
Chloride: 91 mmol/L — ABNORMAL LOW (ref 101–111)
Creatinine, Ser: 0.63 mg/dL (ref 0.61–1.24)
GFR calc Af Amer: >60 mL/min (ref >60)
GFR calc non Af Amer: >60 mL/min (ref >60)
Glucose, Bld: 82 mg/dL (ref 65–99)
Potassium: 3.2 mmol/L — ABNORMAL LOW (ref 3.5–5.1)
Sodium: 135 mmol/L (ref 135–145)

## 2017-08-13 LAB — CBC
HCT: 38.1 % — ABNORMAL LOW (ref 40.0–52.0)
Hemoglobin: 13.1 g/dL (ref 13.0–18.0)
MCH: 31.1 pg (ref 26.0–34.0)
MCHC: 34.5 g/dL (ref 32.0–36.0)
MCV: 90.2 fL (ref 80.0–100.0)
Platelets: 373 10*3/uL (ref 150–440)
RBC: 4.23 MIL/uL — ABNORMAL LOW (ref 4.40–5.90)
RDW: 12.6 % (ref 11.5–14.5)
WBC: 9.2 10*3/uL (ref 3.8–10.6)

## 2017-08-13 LAB — SURGICAL PCR SCREEN
MRSA, PCR: NEGATIVE
Staphylococcus aureus: NEGATIVE

## 2017-08-13 NOTE — Patient Instructions (Addendum)
Your procedure is scheduled on: August 15, 2017  (FRIDAY ) Report to Same Day Surgery 2nd floor medical mall ([facility]-take elevator on left to 2nd floor.  Check in with surgery information desk.) To find out your arrival time please call [phone] between 1PM - 3PM on August 14, 2017 (THURSDAY)  Remember: Instructions that are not followed completely may result in serious medical risk, up to and including death, or upon the discretion of your surgeon and anesthesiologist your surgery may need to be rescheduled.    _x___ 1. Do not eat food after midnight the night before your procedure. You may drink clear liquids up to 2 hours before you are scheduled to arrive at the hospital for your procedure.  Do not drink clear liquids within 2 hours of your scheduled arrival to the hospital.  Clear liquids include  --Water or Apple juice without pulp  --Clear carbohydrate beverage such as ClearFast or Gatorade  --Black Coffee or Clear Tea (No milk, no creamers, do not add anything to                  the coffee or Tea Type 1 and type 2 diabetics should only drink water.  No gum chewing or hard candies.     __x__ 2. No Alcohol for 24 hours before or after surgery.   __x__3. No Smoking for 24 prior to surgery.   ____  4. Bring all medications with you on the day of surgery if instructed.    __x__ 5. Notify your doctor if there is any change in your medical condition     (cold, fever, infections).     Do not wear jewelry, make-up, hairpins, clips or nail polish.  Do not wear lotions, powders, or perfumes.   Do not shave 48 hours prior to surgery. Men may shave face and neck.  Do not bring valuables to the hospital.    [facility] is not responsible for any belongings or valuables.               Contacts, dentures or bridgework may not be worn into surgery.  Leave your suitcase in the car. After surgery it may be brought to your room.  For patients admitted to the hospital,  discharge time is determined by your treatment team                       Patients discharged the day of surgery will not be allowed to drive home.  You will need someone to drive you home and stay with you the night of your procedure.    Please read over the following fact sheets that you were given:   [facility] Preparing for Surgery and or MRSA Information   TAKE THE FOLLOWING MEDICATIONS WITH A SIP OF WATER THE MORNING OF SURGERY :  1. CYMBALTA  2. GABAPENTIN  3. GEMFIBROZIL  4. PANTOPRAZOLE  5. PANTOPRAZOLE AT BEDTIME ON SEPTEMBER  6   6.  ____Fleets enema or Magnesium Citrate as directed.   _x___ Use CHG Soap or sage wipes as directed on instruction sheet   _x___ Use inhalers on the day of surgery and bring to hospital day of surgery (USE ALBUTEROL AND ATROVENT NEBULIZER AND SYMBICORT AND [place] AND BRING ALBUTEROL [place] )  __X__ Stop Metformin  2 days prior to surgery. (STOP KOMBIGLYZE NOW )    ____ Take 1/2 of usual insulin  dose the night before surgery and none on the morning surgery      _x___ Follow recommendations from Cardiologist, Pulmonologist or PCP regarding          stopping Aspirin, Coumadin, Plavix ,Eliquis, Effient, or Pradaxa, and Pletal. (PATIENT STATED HE STOPPED PRADAXA  ON SEPTEMBER  4 )  X____Stop Anti-inflammatories such as Advil, Aleve, Ibuprofen, Motrin, Naproxen, Naprosyn, Goodies powders or aspirin products. OK to take Tylenol    _x___ Stop supplements until after surgery.  But may continue Vitamin D, Vitamin B, and multivitamin         ____ Bring C-Pap to the hospital.

## 2017-08-14 ENCOUNTER — Encounter: Payer: Self-pay | Admitting: *Deleted

## 2017-08-14 MED ORDER — VANCOMYCIN HCL IN DEXTROSE 1-5 GM/200ML-% IV SOLN
1000.0000 mg | Freq: Once | INTRAVENOUS | Status: AC
  Administered 2017-08-15: 1000 mg via INTRAVENOUS

## 2017-08-14 NOTE — Pre-Procedure Instructions (Signed)
K+ results called and faxed to Dr. Elvina Mattes this morning (spoke to [place] ).

## 2017-08-15 ENCOUNTER — Ambulatory Visit
Admission: RE | Admit: 2017-08-15 | Discharge: 2017-08-15 | Disposition: A | Discharge status: Home / Self Care | Payer: Medicare Other | Source: Ambulatory Visit | Attending: Podiatry | Admitting: Podiatry

## 2017-08-15 ENCOUNTER — Ambulatory Visit: Payer: Medicare Other | Admitting: Registered Nurse

## 2017-08-15 ENCOUNTER — Encounter
Admission: RE | Disposition: A | Discharge status: Home / Self Care | Payer: Self-pay | Source: Ambulatory Visit | Attending: Podiatry

## 2017-08-15 DIAGNOSIS — Z885 Allergy status to narcotic agent status: Secondary | ICD-10-CM | POA: Insufficient documentation

## 2017-08-15 DIAGNOSIS — J45909 Unspecified asthma, uncomplicated: Secondary | ICD-10-CM | POA: Insufficient documentation

## 2017-08-15 DIAGNOSIS — M71371 Other bursal cyst, right ankle and foot: Secondary | ICD-10-CM | POA: Diagnosis not present

## 2017-08-15 DIAGNOSIS — R51 Headache: Secondary | ICD-10-CM | POA: Insufficient documentation

## 2017-08-15 DIAGNOSIS — I10 Essential (primary) hypertension: Secondary | ICD-10-CM | POA: Insufficient documentation

## 2017-08-15 DIAGNOSIS — N289 Disorder of kidney and ureter, unspecified: Secondary | ICD-10-CM | POA: Insufficient documentation

## 2017-08-15 DIAGNOSIS — M1990 Unspecified osteoarthritis, unspecified site: Secondary | ICD-10-CM | POA: Insufficient documentation

## 2017-08-15 DIAGNOSIS — Z91030 Bee allergy status: Secondary | ICD-10-CM | POA: Insufficient documentation

## 2017-08-15 DIAGNOSIS — F418 Other specified anxiety disorders: Secondary | ICD-10-CM | POA: Insufficient documentation

## 2017-08-15 DIAGNOSIS — D649 Anemia, unspecified: Secondary | ICD-10-CM | POA: Insufficient documentation

## 2017-08-15 DIAGNOSIS — K219 Gastro-esophageal reflux disease without esophagitis: Secondary | ICD-10-CM | POA: Insufficient documentation

## 2017-08-15 DIAGNOSIS — E118 Type 2 diabetes mellitus with unspecified complications: Secondary | ICD-10-CM | POA: Insufficient documentation

## 2017-08-15 DIAGNOSIS — F17200 Nicotine dependence, unspecified, uncomplicated: Secondary | ICD-10-CM | POA: Insufficient documentation

## 2017-08-15 HISTORY — DX: Pneumonia, unspecified organism: J18.9

## 2017-08-15 HISTORY — PX: LIPOMA EXCISION: SHX5283

## 2017-08-15 HISTORY — DX: Non-pressure chronic ulcer of other part of unspecified foot with unspecified severity: L97.509

## 2017-08-15 LAB — POCT I-STAT 4, (NA,K, GLUC, HGB,HCT)
Glucose, Bld: 72 mg/dL (ref 65–99)
HCT: 41 % (ref 39.0–52.0)
Hemoglobin: 13.9 g/dL (ref 13.0–17.0)
Potassium: 4.4 mmol/L (ref 3.5–5.1)
Sodium: 135 mmol/L (ref 135–145)

## 2017-08-15 LAB — GLUCOSE, CAPILLARY
Glucose-Capillary: 85 mg/dL (ref 65–99)
Glucose-Capillary: 69 mg/dL (ref 65–99)

## 2017-08-15 SURGERY — EXCISION LIPOMA
Anesthesia: General | Laterality: Right

## 2017-08-15 MED ORDER — LIDOCAINE HCL (PF) 2 % IJ SOLN
INTRAMUSCULAR | Status: AC
  Filled 2017-08-15: qty 4

## 2017-08-15 MED ORDER — BUPIVACAINE HCL (PF) 0.5 % IJ SOLN
INTRAMUSCULAR | Status: AC
  Filled 2017-08-15: qty 30

## 2017-08-15 MED ORDER — PHENYLEPHRINE HCL 10 MG/ML IJ SOLN
INTRAMUSCULAR | Status: DC | PRN
  Administered 2017-08-15 (×3): 100 ug via INTRAVENOUS

## 2017-08-15 MED ORDER — FENTANYL CITRATE (PF) 100 MCG/2ML IJ SOLN
INTRAMUSCULAR | Status: DC | PRN
  Administered 2017-08-15: 25 ug via INTRAVENOUS

## 2017-08-15 MED ORDER — MIDAZOLAM HCL 2 MG/2ML IJ SOLN
INTRAMUSCULAR | Status: DC | PRN
  Administered 2017-08-15: 1 mg via INTRAVENOUS

## 2017-08-15 MED ORDER — DEXAMETHASONE SODIUM PHOSPHATE 10 MG/ML IJ SOLN
INTRAMUSCULAR | Status: AC
  Filled 2017-08-15: qty 1

## 2017-08-15 MED ORDER — HYDROCODONE-ACETAMINOPHEN 5-325 MG PO TABS: 1.0000 | ORAL_TABLET | Freq: Four times a day (QID) | ORAL | 0 refills | Status: DC | PRN

## 2017-08-15 MED ORDER — FENTANYL CITRATE (PF) 100 MCG/2ML IJ SOLN
INTRAMUSCULAR | Status: AC
  Filled 2017-08-15: qty 2

## 2017-08-15 MED ORDER — ONDANSETRON HCL 4 MG/2ML IJ SOLN: 4.0000 mg | Freq: Once | INTRAMUSCULAR | Status: DC | PRN

## 2017-08-15 MED ORDER — LIDOCAINE HCL (CARDIAC) 20 MG/ML IV SOLN
INTRAVENOUS | Status: DC | PRN
  Administered 2017-08-15: 40 mg via INTRAVENOUS

## 2017-08-15 MED ORDER — MIDAZOLAM HCL 2 MG/2ML IJ SOLN
INTRAMUSCULAR | Status: AC
  Filled 2017-08-15: qty 2

## 2017-08-15 MED ORDER — GLYCOPYRROLATE 0.2 MG/ML IJ SOLN
INTRAMUSCULAR | Status: DC | PRN
  Administered 2017-08-15: 0.2 mg via INTRAVENOUS

## 2017-08-15 MED ORDER — ROCURONIUM BROMIDE 50 MG/5ML IV SOLN
INTRAVENOUS | Status: AC
  Filled 2017-08-15: qty 1

## 2017-08-15 MED ORDER — SODIUM CHLORIDE 0.9 % IV SOLN
INTRAVENOUS | Status: DC
  Administered 2017-08-15: 09:00:00 via INTRAVENOUS

## 2017-08-15 MED ORDER — GLYCOPYRROLATE 0.2 MG/ML IJ SOLN
INTRAMUSCULAR | Status: AC
  Filled 2017-08-15: qty 1

## 2017-08-15 MED ORDER — LIDOCAINE HCL (PF) 2 % IJ SOLN
INTRAMUSCULAR | Status: AC
  Filled 2017-08-15: qty 6

## 2017-08-15 MED ORDER — VANCOMYCIN HCL IN DEXTROSE 1-5 GM/200ML-% IV SOLN
INTRAVENOUS | Status: AC
  Administered 2017-08-15: 1000 mg via INTRAVENOUS
  Filled 2017-08-15: qty 200

## 2017-08-15 MED ORDER — PROPOFOL 10 MG/ML IV BOLUS
INTRAVENOUS | Status: DC | PRN
  Administered 2017-08-15: 50 mg via INTRAVENOUS

## 2017-08-15 MED ORDER — FENTANYL CITRATE (PF) 100 MCG/2ML IJ SOLN: 25.0000 ug | INTRAMUSCULAR | Status: DC | PRN

## 2017-08-15 MED ORDER — EPHEDRINE SULFATE 50 MG/ML IJ SOLN
INTRAMUSCULAR | Status: AC
  Filled 2017-08-15: qty 1

## 2017-08-15 MED ORDER — PROPOFOL 500 MG/50ML IV EMUL
INTRAVENOUS | Status: AC
  Filled 2017-08-15: qty 100

## 2017-08-15 MED ORDER — ACETAMINOPHEN 10 MG/ML IV SOLN
INTRAVENOUS | Status: DC | PRN
  Administered 2017-08-15: 1000 mg via INTRAVENOUS

## 2017-08-15 MED ORDER — ONDANSETRON HCL 4 MG/2ML IJ SOLN
INTRAMUSCULAR | Status: AC
  Filled 2017-08-15: qty 2

## 2017-08-15 MED ORDER — BUPIVACAINE HCL 0.5 % IJ SOLN
INTRAMUSCULAR | Status: DC | PRN
  Administered 2017-08-15: 6 mL

## 2017-08-15 MED ORDER — ACETAMINOPHEN 10 MG/ML IV SOLN
INTRAVENOUS | Status: AC
  Filled 2017-08-15: qty 100

## 2017-08-15 MED ORDER — PROPOFOL 500 MG/50ML IV EMUL
INTRAVENOUS | Status: DC | PRN
  Administered 2017-08-15: 150 ug/kg/min via INTRAVENOUS

## 2017-08-15 SURGICAL SUPPLY — 32 items
BANDAGE STRETCH 3X4.1 STRL (GAUZE/BANDAGES/DRESSINGS) ×3 IMPLANT
BLADE SURG 15 STRL LF DISP TIS (BLADE) ×1 IMPLANT
BLADE SURG 15 STRL SS (BLADE) ×3
BLADE SURG MINI STRL (BLADE) ×3 IMPLANT
BNDG ESMARK 4X12 TAN STRL LF (GAUZE/BANDAGES/DRESSINGS) ×3 IMPLANT
BNDG GAUZE 4.5X4.1 6PLY STRL (MISCELLANEOUS) ×3 IMPLANT
CLOSURE WOUND 1/4X4 (GAUZE/BANDAGES/DRESSINGS)
CUFF TOURN 18 STER (MISCELLANEOUS) ×1 IMPLANT
CUFF TOURN DUAL PL 12 NO SLV (MISCELLANEOUS) ×3 IMPLANT
DURAPREP 26ML APPLICATOR (WOUND CARE) ×3 IMPLANT
ELECT REM PT RETURN 9FT ADLT (ELECTROSURGICAL) ×3
ELECTRODE REM PT RTRN 9FT ADLT (ELECTROSURGICAL) ×1 IMPLANT
GAUZE PETRO XEROFOAM 1X8 (MISCELLANEOUS) ×3 IMPLANT
GAUZE SPONGE 4X4 12PLY STRL (GAUZE/BANDAGES/DRESSINGS) ×3 IMPLANT
GLOVE BIO SURGEON STRL SZ8 (GLOVE) ×3 IMPLANT
GLOVE INDICATOR 8.0 STRL GRN (GLOVE) IMPLANT
GOWN STRL REUS W/ TWL LRG LVL3 (GOWN DISPOSABLE) ×2 IMPLANT
GOWN STRL REUS W/TWL LRG LVL3 (GOWN DISPOSABLE) ×6
KIT RM TURNOVER STRD PROC AR (KITS) ×3 IMPLANT
LABEL OR SOLS (LABEL) IMPLANT
NDL HYPO 25X1 1.5 SAFETY (NEEDLE) ×1 IMPLANT
NEEDLE HYPO 25X1 1.5 SAFETY (NEEDLE) ×3 IMPLANT
NS IRRIG 500ML POUR BTL (IV SOLUTION) ×3 IMPLANT
PACK EXTREMITY ARMC (MISCELLANEOUS) ×3 IMPLANT
PAD PREP 24X41 OB/GYN DISP (PERSONAL CARE ITEMS) ×1 IMPLANT
PENCIL ELECTRO HAND CTR (MISCELLANEOUS) ×3 IMPLANT
STOCKINETTE STRL 6IN 960660 (GAUZE/BANDAGES/DRESSINGS) ×3 IMPLANT
STRIP CLOSURE SKIN 1/4X4 (GAUZE/BANDAGES/DRESSINGS) IMPLANT
SUT ETHILON 5-0 FS-2 18 BLK (SUTURE) IMPLANT
SUT VIC AB 4-0 FS2 27 (SUTURE) ×3 IMPLANT
SYR 3ML LL SCALE MARK (SYRINGE) ×3 IMPLANT
SYRINGE 10CC LL (SYRINGE) ×3 IMPLANT

## 2017-08-15 NOTE — Anesthesia Preprocedure Evaluation (Signed)
Anesthesia Evaluation  Patient identified by MRN, date of birth, ID band Patient awake    Reviewed: Allergy & Precautions, NPO status , Patient's Chart, lab work & pertinent test results, reviewed documented beta blocker date and time   Airway Mallampati: II  TM Distance: >3 FB     Dental  (+) Chipped   Pulmonary shortness of breath, asthma , sleep apnea , pneumonia, resolved, COPD, Current Smoker,           Cardiovascular hypertension, Pt. on medications      Neuro/Psych  Headaches, PSYCHIATRIC DISORDERS Anxiety Depression    GI/Hepatic GERD  Controlled,  Endo/Other  diabetes, Type 2  Renal/GU Renal disease     Musculoskeletal  (+) Arthritis ,   Abdominal   Peds  Hematology  (+) anemia ,   Anesthesia Other Findings   Reproductive/Obstetrics                             Anesthesia Physical Anesthesia Plan  ASA: III  Anesthesia Plan: General   Post-op Pain Management:    Induction: Intravenous  PONV Risk Score and Plan:   Airway Management Planned: LMA  Additional Equipment:   Intra-op Plan:   Post-operative Plan:   Informed Consent: I have reviewed the patients History and Physical, chart, labs and discussed the procedure including the risks, benefits and alternatives for the proposed anesthesia with the patient or authorized representative who has indicated his/her understanding and acceptance.     Plan Discussed with: CRNA  Anesthesia Plan Comments:         Anesthesia Quick Evaluation

## 2017-08-15 NOTE — Progress Notes (Signed)
Post op shoe in place

## 2017-08-15 NOTE — Progress Notes (Signed)
Capillary refill positive to right foot   Can wiggle toes on right   Warm to touch

## 2017-08-15 NOTE — Anesthesia Postprocedure Evaluation (Signed)
Anesthesia Post Note  Patient: Christian Fields  Procedure(s) Performed: Procedure(s) (LRB): EXCISION TUMOR(CYST) FOOT (Right)  Patient location during evaluation: PACU Anesthesia Type: General Level of consciousness: awake and alert Pain management: pain level controlled Vital Signs Assessment: post-procedure vital signs reviewed and stable Respiratory status: spontaneous breathing, nonlabored ventilation, respiratory function stable and patient connected to nasal cannula oxygen Cardiovascular status: blood pressure returned to baseline and stable Postop Assessment: no signs of nausea or vomiting Anesthetic complications: no     Last Vitals:  Vitals:   08/15/17 1129 08/15/17 1153  BP: 125/88 139/75  Pulse: 72 65  Resp: 16   Temp: (!) 35.9 C   SpO2: 97% 100%    Last Pain:  Vitals:   08/15/17 1153  TempSrc:   PainSc: [place]

## 2017-08-15 NOTE — Anesthesia Procedure Notes (Signed)
Date/Time: 08/15/2017 10:06 AM Performed by: Doreen Salvage Pre-anesthesia Checklist: Patient identified, Emergency Drugs available, Suction available and Patient being monitored Patient Re-evaluated:Patient Re-evaluated prior to induction Oxygen Delivery Method: Simple face mask Induction Type: IV induction Dental Injury: Teeth and Oropharynx as per pre-operative assessment

## 2017-08-15 NOTE — Transfer of Care (Signed)
Immediate Anesthesia Transfer of Care Note  Patient: Christian Fields  Procedure(s) Performed: Procedure(s): EXCISION TUMOR(CYST) FOOT (Right)  Patient Location: PACU  Anesthesia Type:General  Level of Consciousness: sedated  Airway & Oxygen Therapy: Patient Spontanous Breathing and Patient connected to face mask oxygen  Post-op Assessment: Report given to RN and Post -op Vital signs reviewed and stable  Post vital signs: Reviewed and stable  Last Vitals:  Vitals:   08/15/17 0829 08/15/17 1049  BP: 140/90 120/78  Pulse: 68 79  Resp: 20 14  Temp: 36.5 C (!) 36.1 C  SpO2: 16% 109%    Complications: No apparent anesthesia complications

## 2017-08-15 NOTE — Op Note (Signed)
Operative note   Surgeon: Dr. Albertine Patricia, DPM.    Assistant:none    Preop diagnosis:synovial cyst right foot same    Postop diagnosis:Synovial cyst right foot submetatarsal 1 area.    Procedure:   1.excision synovial cyst right foot          MBE:MLJQ than 5 cc    Anesthesia:IV sedationby anesthesia team with local block of 6cc marcaine plain given me.    Hemostasis:ankle tourn. At 225 for 25 min.    Specimen:synovial cyst measuring 4.9E0.1E0.7 cm    Complications:none    Operative indications:chronic recurrent ulcerations plantar to cyst with recurrent infections.    Procedure:  Patient was brought into the OR and placed on the operating table in thesupine position. After anesthesia was obtained theright lower extremity was prepped and draped in usual sterile fashion.  Operative Report: At this time attention was directed to the plantar right foot and 2 semi elliptical incisions were made around a previous site at 3.5 cm long.  The ellipse of skin was left intact to deep structures and blunt and sharp dissection was used to isolate and remove the synovial cyst in toto.The area was then copiously irrigated and capsular tissue was closed over the long flexor tenon with 3-0 vicryl simple interrupted sutures.  Deep and superficial fascia closed in same way.  Skin closed with 3-0 Nylon SI sutures.A sterile compressive dressing was placed across the area with xeroform gauze, 4x4, conform and kling.    Patient tolerated the procedure and anesthesia well.  Was transported from the OR to the PACU with all vital signs stable and vascular status intact. To be discharged per routine protocol.  Will follow up in approximately 1 week in the outpatient clinic.

## 2017-08-15 NOTE — Anesthesia Post-op Follow-up Note (Signed)
Anesthesia QCDR form completed.        

## 2017-08-15 NOTE — Progress Notes (Signed)
Right foot elevated on pillows

## 2017-08-15 NOTE — H&P (Signed)
H and P has been reviewed and no changes are noted.  

## 2017-08-18 LAB — SURGICAL PATHOLOGY

## 2017-08-20 ENCOUNTER — Other Ambulatory Visit: Payer: Self-pay | Admitting: Podiatry

## 2017-10-21 ENCOUNTER — Ambulatory Visit (INDEPENDENT_AMBULATORY_CARE_PROVIDER_SITE_OTHER): Payer: Medicare Other

## 2017-10-21 ENCOUNTER — Encounter (INDEPENDENT_AMBULATORY_CARE_PROVIDER_SITE_OTHER): Payer: Self-pay | Admitting: Vascular Surgery

## 2017-10-21 ENCOUNTER — Ambulatory Visit (INDEPENDENT_AMBULATORY_CARE_PROVIDER_SITE_OTHER): Payer: Medicare Other | Admitting: Vascular Surgery

## 2017-10-21 VITALS — BP 120/73 | HR 103 | Resp 17 | Ht 68.0 in | Wt 158.0 lb

## 2017-10-21 DIAGNOSIS — F17200 Nicotine dependence, unspecified, uncomplicated: Secondary | ICD-10-CM

## 2017-10-21 DIAGNOSIS — E1151 Type 2 diabetes mellitus with diabetic peripheral angiopathy without gangrene: Secondary | ICD-10-CM | POA: Diagnosis not present

## 2017-10-21 DIAGNOSIS — I70212 Atherosclerosis of native arteries of extremities with intermittent claudication, left leg: Secondary | ICD-10-CM

## 2017-10-21 DIAGNOSIS — I10 Essential (primary) hypertension: Secondary | ICD-10-CM

## 2017-10-21 DIAGNOSIS — E785 Hyperlipidemia, unspecified: Secondary | ICD-10-CM | POA: Diagnosis not present

## 2017-10-21 NOTE — Progress Notes (Signed)
MRN : [id number]  Christian Fields is a 61 y.o. (1956-07-15) male who presents with chief complaint of  Chief Complaint  Patient presents with  . Follow-up    3-61moabi,lle art  .  History of Present Illness: Patient returns today in follow up of PAD.  He continues to have only mild leg pain with activity and no lifestyle limiting claudication at current.  No rest pain or ulceration.  Continues to smoke.  Now on oxygen. ABIs today were normal at 1.2 on the right and 1.1 on the left with good waveforms.  He has stable, 30-49% left SFA stenosis on duplex.  Current Outpatient Medications  Medication Sig Dispense Refill  . albuterol (PROVENTIL) (2.5 MG/3ML) 0.083% nebulizer solution 2.5 mg.    . alendronate (FOSAMAX) 70 MG tablet Take 70 mg by mouth once a week.     .[name] Kitchenallopurinol (ZYLOPRIM) 100 MG tablet Take 1 tablet by mouth daily.  11  . aspirin EC 81 MG tablet Take 1 tablet by mouth daily.    . budesonide-formoterol (SYMBICORT) 160-4.5 MCG/ACT inhaler Inhale 2 puffs into the lungs 2 (two) times daily.    . cholecalciferol (VITAMIN D) 1000 units tablet Take 1,000 Units by mouth daily.    . clonazePAM (KLONOPIN) 1 MG tablet Take 1 mg by mouth 3 (three) times daily as needed.   0  . [facility] NICOTINE POLACRILEX 2 MG lozenge Take 1 lozenge by mouth every 2 (two) hours as needed for smoking cessation.   0  . doxycycline (DORYX) 100 MG EC tablet Take 100 mg by mouth 2 (two) times daily.    . DULoxetine (CYMBALTA) 30 MG capsule Take 1 capsule by mouth daily.    . fluticasone (FLONASE) 50 MCG/ACT nasal spray Place 2 sprays into both nostrils daily.  5  . gabapentin (NEURONTIN) 800 MG tablet Take 800 mg by mouth 4 (four) times daily.  0  . gemfibrozil (LOPID) 600 MG tablet Take 600 mg by mouth 2 (two) times daily before a meal.     . hydrochlorothiazide (HYDRODIURIL) 25 MG tablet Take 25 mg by mouth daily.     .[name] KitchenHYDROcodone-acetaminophen (NORCO) 5-325 MG tablet Take 1 tablet by mouth every 6 (six)  hours as needed for moderate pain. 30 tablet 0  . ipratropium (ATROVENT) 0.02 % nebulizer solution Inhale 2.5 mL (500 mcg total) by nebulization every 6 (six) hours.    .[name] KitchenKOMBIGLYZE XR 2.04-999 MG TB24 Take 1 tablet by mouth 2 (two) times daily.    . pantoprazole (PROTONIX) 40 MG tablet Take 40 mg by mouth daily.  1  . PRADAXA 150 MG CAPS capsule Take 150 mg by mouth 2 (two) times daily.     . simvastatin (ZOCOR) 40 MG tablet Take 1 tablet by mouth daily at 6 PM.    . SPIRIVA HANDIHALER 18 MCG inhalation capsule Place 1 capsule into inhaler and inhale daily.    . SUBOXONE 8-2 MG FILM Place 1 Film under the tongue every 8 (eight) hours.  0  . ferrous sulfate 325 (65 FE) MG tablet Take 325 mg by mouth daily with breakfast.    . NICOTINE STEP 1 21 MG/24HR patch Apply 1 patch topically daily.  2  . omega-3 acid ethyl esters (LOVAZA) 1 g capsule Take 1 g by mouth 2 (two) times daily.     No current facility-administered medications for this visit.     Past Medical History:  Diagnosis Date  . Anemia   .  Anxiety   . Arthritis   . Asthma   . Cancer ([place])    Basal Cell Skin Cancer  . Chronic back pain   . COPD (chronic obstructive pulmonary disease) ([place])   . Depression   . Diabetes mellitus ([place])   . Dyspnea   . GERD (gastroesophageal reflux disease)   . Gout   . Gout   . Headache   . History of blood clots    Left Leg--July 2018  . History of kidney stones   . Hyperlipidemia   . Hyperlipidemia   . Hypertension   . Kidney stones   . Neuropathy   . On home oxygen therapy    2 L / M  . Pneumonia 06/2017  . Sleep apnea   . Ulcer of foot ([place])    Right    Past Surgical History:  Procedure Laterality Date  . APPENDECTOMY    . DG FEET 2 VIEWS BILAT    . OTHER SURGICAL HISTORY Bilateral Foot surgery    Family History  Problem Relation Age of Onset  . Hypertension Mother   No bleeding disorders, clotting disorders, autoimmune diseases, or aneurysms  Social History         Social History  Substance Use Topics  . Smoking status: Current Every Day Smoker    Packs/day: 0.50    Types: Cigarettes  . Smokeless tobacco: Never Used  . Alcohol use 0.6 oz/week     1 Cans of beer per week   No IV drug use      Allergies  Allergen Reactions  . Bee Venom Anaphylaxis  . Hydrocodone-Acetaminophen Itching       REVIEW OF SYSTEMS (Negative unless checked)  Constitutional: '[]' Weight loss  '[]' Fever  '[]' Chills Cardiac: '[]' Chest pain   '[]' Chest pressure   '[]' Palpitations   '[]' Shortness of breath when laying flat   '[]' Shortness of breath at rest   '[x]' Shortness of breath with exertion. Vascular:  '[x]' Pain in legs with walking   '[]' Pain in legs at rest   '[]' Pain in legs when laying flat   '[x]' Claudication   '[]' Pain in feet when walking  '[]' Pain in feet at rest  '[]' Pain in feet when laying flat   '[]' History of DVT   '[]' Phlebitis   '[]' Swelling in legs   '[]' Varicose veins   '[]' Non-healing ulcers Pulmonary:   '[]' Uses home oxygen   '[]' Productive cough   '[]' Hemoptysis   '[]' Wheeze  '[x]' COPD   '[]' Asthma Neurologic:  '[]' Dizziness  '[]' Blackouts   '[]' Seizures   '[]' History of stroke   '[]' History of TIA  '[]' Aphasia   '[]' Temporary blindness   '[]' Dysphagia   '[]' Weakness or numbness in arms   '[]' Weakness or numbness in legs Musculoskeletal:  '[x]' Arthritis   '[]' Joint swelling   '[]' Joint pain   '[x]' Low back pain Hematologic:  '[]' Easy bruising  '[]' Easy bleeding   '[]' Hypercoagulable state   '[]' Anemic  '[]' Hepatitis Gastrointestinal:  '[]' Blood in stool   '[]' Vomiting blood  '[]' Gastroesophageal reflux/heartburn   '[]' Abdominal pain Genitourinary:  '[]' Chronic kidney disease   '[]' Difficult urination  '[]' Frequent urination  '[]' Burning with urination   '[]' Hematuria Skin:  '[]' Rashes   '[]' Ulcers   '[]' Wounds Psychological:  '[]' History of anxiety   '[]'  History of major depression.     Physical Examination  BP 120/73 (BP Location: Right Arm)   Pulse (!) 103   Resp 17   Ht '5\' 8"'  (1.727 m)   Wt 158 lb (71.7 kg)   BMI 24.02 kg/m  Gen:  WD/WN,  NAD Head: [place]/AT, No temporalis wasting.  Ear/Nose/Throat: Hearing grossly intact, nares w/o erythema or drainage, trachea midline Eyes: Conjunctiva clear. Sclera non-icteric Neck: Supple.  No JVD.  Pulmonary:  Good air movement, respirations not labored on supplemental oxygen Cardiac: RRR, normal S1, S2 Vascular:  Vessel Right Left  Radial Palpable Palpable  Ulnar Palpable Palpable  Brachial Palpable Palpable  Carotid Palpable, without bruit Palpable, without bruit  Aorta Not palpable N/A  Femoral Palpable Palpable  Popliteal Palpable Palpable  PT Palpable 1+ Palpable  DP Palpable Palpable    Musculoskeletal: M/S 5/5 throughout.  No deformity or atrophy. No edema. Neurologic: Sensation grossly intact in extremities.  Symmetrical.  Speech is fluent.  Psychiatric: Judgment intact, Mood & affect appropriate for pt's clinical situation. Dermatologic: No rashes or ulcers noted.  No cellulitis or open wounds.       Labs Recent Results (from the past 2160 hour(s))  Surgical pcr screen     Status: None   Collection Time: 08/13/17 12:58 PM  Result Value Ref Range   MRSA, PCR NEGATIVE NEGATIVE   Staphylococcus aureus NEGATIVE NEGATIVE    Comment: (NOTE) The Xpert SA Assay (FDA approved for NASAL specimens in patients 25 years of age and older), is one component of a comprehensive surveillance program. It is not intended to diagnose infection nor to guide or monitor treatment.   CBC     Status: Abnormal   Collection Time: 08/13/17 12:58 PM  Result Value Ref Range   WBC 9.2 3.8 - 10.6 K/uL   RBC 4.23 (L) 4.40 - 5.90 MIL/uL   Hemoglobin 13.1 13.0 - 18.0 g/dL   HCT 38.1 (L) 40.0 - 52.0 %   MCV 90.2 80.0 - 100.0 fL   MCH 31.1 26.0 - 34.0 pg   MCHC 34.5 32.0 - 36.0 g/dL   RDW 12.6 11.5 - 14.5 %   Platelets 373 150 - 440 K/uL  Differential     Status: None   Collection Time: 08/13/17 12:58 PM  Result Value Ref Range   Neutrophils Relative % 69 %   Neutro Abs 6.4 1.4 - 6.5  K/uL   Lymphocytes Relative 20 %   Lymphs Abs 1.8 1.0 - 3.6 K/uL   Monocytes Relative 7 %   Monocytes Absolute 0.6 0.2 - 1.0 K/uL   Eosinophils Relative 3 %   Eosinophils Absolute 0.2 0 - 0.7 K/uL   Basophils Relative 1 %   Basophils Absolute 0.1 0 - 0.1 K/uL  Basic metabolic panel     Status: Abnormal   Collection Time: 08/13/17 12:58 PM  Result Value Ref Range   Sodium 135 135 - 145 mmol/L   Potassium 3.2 (L) 3.5 - 5.1 mmol/L   Chloride 91 (L) 101 - 111 mmol/L   CO2 33 (H) 22 - 32 mmol/L   Glucose, Bld 82 65 - 99 mg/dL   BUN 16 6 - 20 mg/dL   Creatinine, Ser 0.63 0.61 - 1.24 mg/dL   Calcium 9.7 8.9 - 10.3 mg/dL   GFR calc non Af Amer >60 >60 mL/min   GFR calc Af Amer >60 >60 mL/min    Comment: (NOTE) The eGFR has been calculated using the CKD EPI equation. This calculation has not been validated in all clinical situations. eGFR's persistently <60 mL/min signify possible Chronic Kidney Disease.    Anion gap 11 5 - 15  Glucose, capillary     Status: None   Collection Time: 08/15/17  8:29 AM  Result Value Ref Range   Glucose-Capillary 85 65 - 99  mg/dL  I-STAT 4, (NA,K, GLUC, HGB,HCT)     Status: None   Collection Time: 08/15/17  8:49 AM  Result Value Ref Range   Sodium 135 135 - 145 mmol/L   Potassium 4.4 3.5 - 5.1 mmol/L   Glucose, Bld 72 65 - 99 mg/dL   HCT 41.0 39.0 - 52.0 %   Hemoglobin 13.9 13.0 - 17.0 g/dL  Surgical pathology     Status: None   Collection Time: 08/15/17 10:22 AM  Result Value Ref Range   SURGICAL PATHOLOGY      Surgical Pathology CASE: [id number] PATIENT: Art Doughty Surgical Pathology Report     SPECIMEN SUBMITTED: A. Synovium cyst  CLINICAL HISTORY: None provided  PRE-OPERATIVE DIAGNOSIS: Other bursal cyst - right foot  POST-OPERATIVE DIAGNOSIS: None provided.     DIAGNOSIS: A. SYNOVIUM CYST; EXCISION: - SYNOVIAL CYST WITH SURROUNDING REACTIVE FIBROSIS.   GROSS DESCRIPTION:  A. Labeled: synovial cysts  Tissue  fragment(s): 1  Size: 3.4 x 2.5 x 1.5 cm  Description: unoriented irregular shaped fragment of purple to tan tissue with an edge of attached 3.2 x 1.5 cm wrinkled tan skin, inked blue and sectioned to reveal the underlying tissue soft fibrous with a 0.9 x 0.9 x 0.1 cm cystic area  Representative submitted in one cassette(s).    Final Diagnosis performed by Quay Burow, MD.  Electronically signed 08/18/2017 9:23:55AM    The electronic signature indicates that the named Attending Pathologist has evaluated the specimen  Technical co mponent performed at [facility], [address], [place], [place] [address] Lab: [phone] Dir: Darrick Penna. Evette Doffing, MD  Professional component performed at [facility], [facility], [place], [place], [place] [address] Lab: [phone] Dir: Dellia Nims. Rubinas, MD    Glucose, capillary     Status: None   Collection Time: 08/15/17 10:59 AM  Result Value Ref Range   Glucose-Capillary 69 65 - 99 mg/dL    Radiology No results found.    Assessment/Plan Hypertension blood pressure control important in reducing the progression of atherosclerotic disease. On appropriate oral medications.   Diabetes mellitus ([place]) blood glucose control important in reducing the progression of atherosclerotic disease. Also, involved in wound healing. On appropriate medications.   Hyperlipidemia lipid control important in reducing the progression of atherosclerotic disease. Continue statin therapy    Atherosclerosis of native arteries of extremity with intermittent claudication (HCC) ABIs today were normal at 1.2 on the right and 1.1 on the left with good waveforms.  He has stable, 30-49% left SFA stenosis on duplex.  He has no limb threatening symptoms.  Increasing his activity, controlling his medical comorbidities, and smoking cessation with benefit long-term.  Plan to recheck ABIs in 1 year    Leotis Pain, MD  10/21/2017 2:59  PM    This note was created with Dragon medical transcription system.  Any errors from dictation are purely unintentional

## 2017-10-21 NOTE — Patient Instructions (Signed)

## 2017-10-21 NOTE — Assessment & Plan Note (Signed)
ABIs today were normal at 1.2 on the right and 1.1 on the left with good waveforms.  He has stable, 30-49% left SFA stenosis on duplex.  He has no limb threatening symptoms.  Increasing his activity, controlling his medical comorbidities, and smoking cessation with benefit long-term.  Plan to recheck ABIs in 1 year

## 2017-11-11 DIAGNOSIS — L97521 Non-pressure chronic ulcer of other part of left foot limited to breakdown of skin: Secondary | ICD-10-CM | POA: Diagnosis not present

## 2017-11-11 DIAGNOSIS — L97512 Non-pressure chronic ulcer of other part of right foot with fat layer exposed: Secondary | ICD-10-CM | POA: Diagnosis not present

## 2017-11-19 DIAGNOSIS — J449 Chronic obstructive pulmonary disease, unspecified: Secondary | ICD-10-CM | POA: Diagnosis not present

## 2017-11-20 DIAGNOSIS — I10 Essential (primary) hypertension: Secondary | ICD-10-CM | POA: Diagnosis not present

## 2017-11-20 DIAGNOSIS — F329 Major depressive disorder, single episode, unspecified: Secondary | ICD-10-CM | POA: Diagnosis not present

## 2017-11-20 DIAGNOSIS — J449 Chronic obstructive pulmonary disease, unspecified: Secondary | ICD-10-CM | POA: Diagnosis not present

## 2017-11-20 DIAGNOSIS — G8929 Other chronic pain: Secondary | ICD-10-CM | POA: Diagnosis not present

## 2017-11-20 DIAGNOSIS — R918 Other nonspecific abnormal finding of lung field: Secondary | ICD-10-CM | POA: Diagnosis not present

## 2017-11-20 DIAGNOSIS — Z7982 Long term (current) use of aspirin: Secondary | ICD-10-CM | POA: Diagnosis not present

## 2017-11-20 DIAGNOSIS — R0981 Nasal congestion: Secondary | ICD-10-CM | POA: Diagnosis not present

## 2017-11-20 DIAGNOSIS — E119 Type 2 diabetes mellitus without complications: Secondary | ICD-10-CM | POA: Diagnosis not present

## 2017-11-20 DIAGNOSIS — R0602 Shortness of breath: Secondary | ICD-10-CM | POA: Diagnosis not present

## 2017-11-20 DIAGNOSIS — Z7951 Long term (current) use of inhaled steroids: Secondary | ICD-10-CM | POA: Diagnosis not present

## 2017-11-20 DIAGNOSIS — Z7983 Long term (current) use of bisphosphonates: Secondary | ICD-10-CM | POA: Diagnosis not present

## 2017-11-20 DIAGNOSIS — J069 Acute upper respiratory infection, unspecified: Secondary | ICD-10-CM | POA: Diagnosis not present

## 2017-11-20 DIAGNOSIS — R05 Cough: Secondary | ICD-10-CM | POA: Diagnosis not present

## 2017-11-25 DIAGNOSIS — E1142 Type 2 diabetes mellitus with diabetic polyneuropathy: Secondary | ICD-10-CM | POA: Diagnosis not present

## 2017-11-25 DIAGNOSIS — L97512 Non-pressure chronic ulcer of other part of right foot with fat layer exposed: Secondary | ICD-10-CM | POA: Diagnosis not present

## 2017-11-28 DIAGNOSIS — J449 Chronic obstructive pulmonary disease, unspecified: Secondary | ICD-10-CM | POA: Diagnosis not present

## 2017-12-07 DIAGNOSIS — J449 Chronic obstructive pulmonary disease, unspecified: Secondary | ICD-10-CM | POA: Diagnosis not present

## 2018-03-24 ENCOUNTER — Ambulatory Visit: Payer: Medicare HMO | Attending: Specialist

## 2018-04-20 DIAGNOSIS — S2249XA Multiple fractures of ribs, unspecified side, initial encounter for closed fracture: Secondary | ICD-10-CM | POA: Insufficient documentation

## 2018-04-20 DIAGNOSIS — E663 Overweight: Secondary | ICD-10-CM | POA: Insufficient documentation

## 2018-07-17 ENCOUNTER — Ambulatory Visit: Payer: Medicare HMO | Attending: Specialist

## 2018-07-24 ENCOUNTER — Ambulatory Visit: Payer: Medicare HMO | Attending: Specialist

## 2018-08-20 ENCOUNTER — Ambulatory Visit: Payer: Medicare HMO | Attending: Specialist

## 2018-10-13 ENCOUNTER — Other Ambulatory Visit: Payer: Self-pay | Admitting: Anesthesiology

## 2018-10-13 ENCOUNTER — Ambulatory Visit
Admission: RE | Admit: 2018-10-13 | Discharge: 2018-10-13 | Disposition: A | Discharge status: Home / Self Care | Payer: Medicare HMO | Source: Ambulatory Visit | Attending: Anesthesiology | Admitting: Anesthesiology

## 2018-10-13 DIAGNOSIS — M25539 Pain in unspecified wrist: Secondary | ICD-10-CM

## 2018-10-13 DIAGNOSIS — R29898 Other symptoms and signs involving the musculoskeletal system: Principal | ICD-10-CM

## 2018-10-13 DIAGNOSIS — M25511 Pain in right shoulder: Secondary | ICD-10-CM | POA: Insufficient documentation

## 2018-10-13 DIAGNOSIS — M2550 Pain in unspecified joint: Secondary | ICD-10-CM

## 2018-10-23 ENCOUNTER — Ambulatory Visit (INDEPENDENT_AMBULATORY_CARE_PROVIDER_SITE_OTHER): Payer: Medicare Other | Admitting: Vascular Surgery

## 2018-10-23 ENCOUNTER — Encounter (INDEPENDENT_AMBULATORY_CARE_PROVIDER_SITE_OTHER): Payer: Medicare Other

## 2018-10-27 ENCOUNTER — Ambulatory Visit (INDEPENDENT_AMBULATORY_CARE_PROVIDER_SITE_OTHER): Payer: Medicare HMO

## 2018-10-27 ENCOUNTER — Encounter (INDEPENDENT_AMBULATORY_CARE_PROVIDER_SITE_OTHER): Payer: Self-pay | Admitting: Vascular Surgery

## 2018-10-27 ENCOUNTER — Ambulatory Visit (INDEPENDENT_AMBULATORY_CARE_PROVIDER_SITE_OTHER): Payer: Medicare HMO | Admitting: Vascular Surgery

## 2018-10-27 VITALS — BP 97/66 | HR 93 | Resp 16 | Ht 68.0 in | Wt 161.0 lb

## 2018-10-27 DIAGNOSIS — I10 Essential (primary) hypertension: Secondary | ICD-10-CM | POA: Diagnosis not present

## 2018-10-27 DIAGNOSIS — E785 Hyperlipidemia, unspecified: Secondary | ICD-10-CM | POA: Diagnosis not present

## 2018-10-27 DIAGNOSIS — F17210 Nicotine dependence, cigarettes, uncomplicated: Secondary | ICD-10-CM

## 2018-10-27 DIAGNOSIS — I70212 Atherosclerosis of native arteries of extremities with intermittent claudication, left leg: Secondary | ICD-10-CM

## 2018-10-27 DIAGNOSIS — E1151 Type 2 diabetes mellitus with diabetic peripheral angiopathy without gangrene: Secondary | ICD-10-CM | POA: Diagnosis not present

## 2018-10-27 NOTE — Progress Notes (Signed)
MRN : [id number]  Christian Fields is a 62 y.o. (20-Apr-1956) male who presents with chief complaint of  Chief Complaint  Patient presents with  . Follow-up    ultrasound follow up  .  History of Present Illness: Patient returns today in follow up of PAD.  He still has what sounds like a lot of neuropathic pain in both lower extremities.  He has had known mild peripheral arterial disease for some years but this has not required intervention.  His ABIs today remain normal at 1.1 bilaterally.  His digital pressures are mild to moderately reduced bilaterally a little more so on the left than the right, but overall his perfusion appears to remain intact.  Current Outpatient Medications  Medication Sig Dispense Refill  . alendronate (FOSAMAX) 70 MG tablet Take 70 mg by mouth once a week.     [name] Kitchen allopurinol (ZYLOPRIM) 100 MG tablet Take 1 tablet by mouth daily.  11  . aspirin EC 81 MG tablet Take 1 tablet by mouth daily.    . budesonide-formoterol (SYMBICORT) 160-4.5 MCG/ACT inhaler Inhale 2 puffs into the lungs 2 (two) times daily.    . cholecalciferol (VITAMIN D) 1000 units tablet Take 1,000 Units by mouth daily.    . clonazePAM (KLONOPIN) 1 MG tablet Take 1 mg by mouth 3 (three) times daily as needed.   0  . [facility]S NICOTINE POLACRILEX 2 MG lozenge Take 1 lozenge by mouth every 2 (two) hours as needed for smoking cessation.   0  . DULoxetine (CYMBALTA) 30 MG capsule Take 1 capsule by mouth daily.    . fluticasone (FLONASE) 50 MCG/ACT nasal spray Place 2 sprays into both nostrils daily.  5  . gabapentin (NEURONTIN) 800 MG tablet Take 800 mg by mouth 4 (four) times daily.  0  . hydrochlorothiazide (HYDRODIURIL) 25 MG tablet Take 25 mg by mouth daily.     [name] Kitchen KOMBIGLYZE XR 2.04-999 MG TB24 Take 1 tablet by mouth 2 (two) times daily.    [name] Kitchen PRADAXA 150 MG CAPS capsule Take 150 mg by mouth 2 (two) times daily.     . simvastatin (ZOCOR) 40 MG tablet Take 1 tablet by mouth daily at 6 PM.    . SPIRIVA  HANDIHALER 18 MCG inhalation capsule Place 1 capsule into inhaler and inhale daily.    . SUBOXONE 8-2 MG FILM Place 1 Film under the tongue every 8 (eight) hours.  0  . albuterol (PROVENTIL) (2.5 MG/3ML) 0.083% nebulizer solution 2.5 mg.    . doxycycline (DORYX) 100 MG EC tablet Take 100 mg by mouth 2 (two) times daily.    . ferrous sulfate 325 (65 FE) MG tablet Take 325 mg by mouth daily with breakfast.    . gemfibrozil (LOPID) 600 MG tablet Take 600 mg by mouth 2 (two) times daily before a meal.     . HYDROcodone-acetaminophen (NORCO) 5-325 MG tablet Take 1 tablet by mouth every 6 (six) hours as needed for moderate pain. (Patient not taking: Reported on 10/27/2018) 30 tablet 0  . ipratropium (ATROVENT) 0.02 % nebulizer solution Inhale 2.5 mL (500 mcg total) by nebulization every 6 (six) hours.    [name] Kitchen NICOTINE STEP 1 21 MG/24HR patch Apply 1 patch topically daily.  2  . omega-3 acid ethyl esters (LOVAZA) 1 g capsule Take 1 g by mouth 2 (two) times daily.    . pantoprazole (PROTONIX) 40 MG tablet Take 40 mg by mouth daily.  1   No current facility-administered  medications for this visit.     Past Medical History:  Diagnosis Date  . Anemia   . Anxiety   . Arthritis   . Asthma   . Cancer ([place])    Basal Cell Skin Cancer  . Chronic back pain   . COPD (chronic obstructive pulmonary disease) ([place])   . Depression   . Diabetes mellitus ([place])   . Dyspnea   . GERD (gastroesophageal reflux disease)   . Gout   . Gout   . Headache   . History of blood clots    Left Leg--July 2018  . History of kidney stones   . Hyperlipidemia   . Hyperlipidemia   . Hypertension   . Kidney stones   . Neuropathy   . On home oxygen therapy    2 L / M  . Pneumonia 06/2017  . Sleep apnea   . Ulcer of foot ([place])    Right    Past Surgical History:  Procedure Laterality Date  . APPENDECTOMY    . DG FEET 2 VIEWS BILAT    . LIPOMA EXCISION Right 08/15/2017   Procedure: EXCISION TUMOR(CYST) FOOT;  Surgeon:  Albertine Patricia, DPM;  Location: [facility] ORS;  Service: Podiatry;  Laterality: Right;  . OTHER SURGICAL HISTORY Bilateral Foot surgery    Family History  Problem Relation Age of Onset  . Hypertension Mother   No bleeding disorders, clotting disorders, autoimmune diseases, or aneurysms  Social History       Social History  Substance Use Topics  . Smoking status: Current Every Day Smoker    Packs/day: 0.50    Types: Cigarettes  . Smokeless tobacco: Never Used  . Alcohol use 0.6 oz/week     1 Cans of beer per week   No IV drug use      Allergies  Allergen Reactions  . Bee Venom Anaphylaxis  . Hydrocodone-Acetaminophen Itching       REVIEW OF SYSTEMS(Negative unless checked)  Constitutional: [] Weight loss[] Fever[] Chills Cardiac:[] Chest pain[] Chest pressure[] Palpitations [] Shortness of breath when laying flat [] Shortness of breath at rest [x] Shortness of breath with exertion. Vascular: [x] Pain in legs with walking[] Pain in legsat rest[] Pain in legs when laying flat [x] Claudication [] Pain in feet when walking [] Pain in feet at rest [] Pain in feet when laying flat [] History of DVT [] Phlebitis [] Swelling in legs [] Varicose veins [] Non-healing ulcers Pulmonary: [x] Uses home oxygen [] Productive cough[] Hemoptysis [] Wheeze [x] COPD [] Asthma Neurologic: [] Dizziness [] Blackouts [] Seizures [] History of stroke [] History of TIA[] Aphasia [] Temporary blindness[] Dysphagia [] Weaknessor numbness in arms [] Weakness or numbnessin legs Musculoskeletal: [x] Arthritis [] Joint swelling [] Joint pain [x] Low back pain Hematologic:[] Easy bruising[] Easy bleeding [] Hypercoagulable state [] Anemic [] Hepatitis Gastrointestinal:[] Blood in stool[] Vomiting blood[] Gastroesophageal reflux/heartburn[] Abdominal pain Genitourinary: [] Chronic kidney disease [] Difficulturination  [] Frequenturination [] Burning with urination[] Hematuria Skin: [] Rashes [] Ulcers [] Wounds Psychological: [] History of anxiety[] History of major depression.    Physical Examination  BP 97/66 (BP Location: Right Arm)   Pulse 93   Resp 16   Ht 5\' 8"  (1.727 m)   Wt 161 lb (73 kg)   BMI 24.48 kg/m  Gen:  WD/WN, NAD Head: [place]/AT, No temporalis wasting. Ear/Nose/Throat: Hearing grossly intact, nares w/o erythema or drainage Eyes: Conjunctiva clear. Sclera non-icteric Neck: Supple.  Trachea midline Pulmonary:  Good air movement, no use of accessory muscles on supplemental oxygen Cardiac: RRR, no JVD Vascular:  Vessel Right Left  Radial Palpable Palpable                          PT Palpable Palpable  DP Palpable Palpable  Musculoskeletal: M/S 5/5 throughout.  No deformity or atrophy. No edema. Neurologic: Sensation grossly intact in extremities.  Symmetrical.  Speech is fluent.  Psychiatric: Judgment intact, Mood & affect appropriate for pt's clinical situation. Dermatologic: No rashes or ulcers noted.  No cellulitis or open wounds.       Labs No results found for this or any previous visit (from the past 2160 hour(s)).  Radiology Dg Shoulder Right  Result Date: 10/14/2018 CLINICAL DATA:  Right shoulder pain.  No known injury. EXAM: RIGHT SHOULDER - 2+ VIEW COMPARISON:  No recent. FINDINGS: No acute bony or joint abnormality identified. No evidence of fracture or dislocation. IMPRESSION: No acute abnormality. Electronically Signed   By: Marcello Moores  Register   On: 10/14/2018 09:17    Assessment/Plan Hypertension blood pressure control important in reducing the progression of atherosclerotic disease. On appropriate oral medications.   Diabetes mellitus ([place]) blood glucose control important in reducing the progression of atherosclerotic disease. Also, involved in wound healing. On appropriate medications.   Hyperlipidemia lipid control important in  reducing the progression of atherosclerotic disease. Continue statin therapy  Atherosclerosis of native arteries of extremity with intermittent claudication (HCC) His ABIs today remain normal at 1.1 bilaterally.  His digital pressures are mild to moderately reduced bilaterally a little more so on the left than the right, but overall his perfusion appears to remain intact. Do not think revascularization at this point would be likely to improve his symptoms.  Plan to recheck annually.  Continue current medical regimen including aspirin, statin agent, and Neurontin for his neuropathy    Leotis Pain, MD  10/27/2018 12:20 PM    This note was created with Dragon medical transcription system.  Any errors from dictation are purely unintentional

## 2018-10-27 NOTE — Assessment & Plan Note (Signed)
His ABIs today remain normal at 1.1 bilaterally.  His digital pressures are mild to moderately reduced bilaterally a little more so on the left than the right, but overall his perfusion appears to remain intact. Do not think revascularization at this point would be likely to improve his symptoms.  Plan to recheck annually.  Continue current medical regimen including aspirin, statin agent, and Neurontin for his neuropathy

## 2018-11-02 DIAGNOSIS — H25013 Cortical age-related cataract, bilateral: Secondary | ICD-10-CM | POA: Insufficient documentation

## 2018-11-18 DIAGNOSIS — G459 Transient cerebral ischemic attack, unspecified: Secondary | ICD-10-CM | POA: Insufficient documentation

## 2018-12-24 DIAGNOSIS — R2230 Localized swelling, mass and lump, unspecified upper limb: Secondary | ICD-10-CM | POA: Insufficient documentation

## 2018-12-24 DIAGNOSIS — M898X5 Other specified disorders of bone, thigh: Secondary | ICD-10-CM | POA: Insufficient documentation

## 2019-01-15 ENCOUNTER — Ambulatory Visit (INDEPENDENT_AMBULATORY_CARE_PROVIDER_SITE_OTHER): Payer: Medicare HMO | Admitting: Vascular Surgery

## 2019-01-15 ENCOUNTER — Encounter (INDEPENDENT_AMBULATORY_CARE_PROVIDER_SITE_OTHER): Payer: Self-pay | Admitting: Vascular Surgery

## 2019-01-15 ENCOUNTER — Encounter (INDEPENDENT_AMBULATORY_CARE_PROVIDER_SITE_OTHER): Payer: Self-pay

## 2019-01-15 VITALS — BP 99/62 | HR 93 | Resp 14 | Ht 68.0 in | Wt 159.8 lb

## 2019-01-15 DIAGNOSIS — I714 Abdominal aortic aneurysm, without rupture, unspecified: Secondary | ICD-10-CM

## 2019-01-15 DIAGNOSIS — E785 Hyperlipidemia, unspecified: Secondary | ICD-10-CM | POA: Diagnosis not present

## 2019-01-15 DIAGNOSIS — E1151 Type 2 diabetes mellitus with diabetic peripheral angiopathy without gangrene: Secondary | ICD-10-CM | POA: Diagnosis not present

## 2019-01-15 DIAGNOSIS — I10 Essential (primary) hypertension: Secondary | ICD-10-CM | POA: Diagnosis not present

## 2019-01-15 DIAGNOSIS — I70212 Atherosclerosis of native arteries of extremities with intermittent claudication, left leg: Secondary | ICD-10-CM

## 2019-01-15 NOTE — Assessment & Plan Note (Signed)
The patient has a 3.3 cm distal abdominal aortic aneurysm seen on a CT a couple of months ago.  We discussed the pathophysiology and natural history of aneurysm disease.  This is a small aneurysm and does not require any immediate treatment.  When he comes back for his follow-up studies on his legs in November, we will check an aortic duplex at that time.

## 2019-01-15 NOTE — Patient Instructions (Signed)
Abdominal Aortic Aneurysm    An aneurysm is a bulge in one of the blood vessels that carry blood away from the heart (artery). It happens when blood pushes up against a weak or damaged place in the wall of an artery. An abdominal aortic aneurysm happens in the main artery of the body (aorta).  Some aneurysms may not cause problems. If it grows, it can burst or tear, causing bleeding inside the body. This is an emergency. It needs to be treated right away.  What are the causes?  The exact cause of this condition is not known.  What increases the risk?  The following may make you more likely to get this condition:   Being a male who is 60 years of age or older.   Being white (Caucasian).   Using tobacco.   Having a family history of aneurysms.   Having the following conditions:  ? Hardening of the arteries (arteriosclerosis).  ? Inflammation of the walls of an artery (arteritis).  ? Certain genetic conditions.  ? Being very overweight (obesity).  ? An infection in the wall of the aorta (infectious aortitis).  ? High cholesterol.  ? High blood pressure (hypertension).  What are the signs or symptoms?  Symptoms depend on the size of the aneurysm and how fast it is growing. Most grow slowly and do not cause any symptoms. If symptoms do occur, they may include:   Pain in the belly (abdomen), side, or back.   Feeling full after eating only small amounts of food.   Feeling a throbbing lump in the belly.  Symptoms that the aneurysm has burst (ruptured) include:   Sudden, very bad pain in the belly, side, or back.   Feeling sick to your stomach (nauseous).   Throwing up (vomiting).   Feeling light-headed or passing out.  How is this treated?  Treatment for this condition depends on:   The size of the aneurysm.   How fast it is growing.   Your age.   Your risk of having it burst.  If your aneurysm is smaller than 2 inches (5 cm), your doctor may manage it by:   Checking it often to see if it is getting bigger.  You may have an imaging test (ultrasound) to check it every 3-6 months, every year, or every few years.   Giving you medicines to:  ? Control blood pressure.  ? Treat pain.  ? Fight infection.  If your aneurysm is larger than 2 inches (5 cm), you may need surgery to fix it.  Follow these instructions at home:  Lifestyle   Do not use any products that have nicotine or tobacco in them. This includes cigarettes, e-cigarettes, and chewing tobacco. If you need help quitting, ask your doctor.   Get regular exercise. Ask your doctor what types of exercise are best for you.  Eating and drinking   Eat a heart-healthy diet. This includes eating plenty of:  ? Fresh fruits and vegetables.  ? Whole grains.  ? Low-fat (lean) protein.  ? Low-fat dairy products.   Avoid foods that are high in saturated fat and cholesterol. These foods include red meat and some dairy products.   Do not drink alcohol if:  ? Your doctor tells you not to drink.  ? You are pregnant, may be pregnant, or are planning to become pregnant.   If you drink alcohol:  ? Limit how much you use to:   0-1 drink a day for women.     0-2 drinks a day for men.  ? Be aware of how much alcohol is in your drink. In the [place] one drink equals any of these:   One typical bottle of beer (12 oz).   One-half glass of wine (5 oz).   One shot of hard liquor (1 oz).  General instructions   Take over-the-counter and prescription medicines only as told by your doctor.   Keep your blood pressure within normal limits. Ask your doctor what your blood pressure should be.   Have your blood sugar (glucose) level and cholesterol levels checked regularly. Keep your blood sugar level and cholesterol levels within normal limits.   Avoid heavy lifting and activities that take a lot of effort. Ask your doctor what activities are safe for you.   Keep all follow-up visits as told by your doctor. This is important.  ? Talk to your doctor about regular screenings to see if the  aneurysm is getting bigger.  Contact a doctor if you:   Have pain in your belly, side, or back.   Have a throbbing feeling in your belly.   Have a family history of aneurysms.  Get help right away if you:   Have sudden, bad pain in your belly, side, or back.   Feel sick to your stomach.   Throw up.   Have trouble pooping (constipation).   Have trouble peeing (urinating).   Feel light-headed.   Have a fast heart rate when you stand.   Have sweaty skin that is cold to the touch (clammy).   Have shortness of breath.   Have a fever.  These symptoms may be an emergency. Do not wait to see if the symptoms will go away. Get medical help right away. Call your local emergency services (911 in the [place] Do not drive yourself to the hospital.  Summary   An aneurysm is a bulge in one of the blood vessels that carry blood away from the heart (artery). Some aneurysms may not cause problems.   You may need to have yours checked often. If it grows, it can burst or tear. This causes bleeding inside the body. It needs to be treated right away.   Follow instructions from your doctor about healthy lifestyle changes.   Keep all follow-up visits as told by your doctor. This is important.  This information is not intended to replace advice given to you by your health care provider. Make sure you discuss any questions you have with your health care provider.  Document Released: 03/22/2013 Document Revised: 07/04/2018 Document Reviewed: 07/04/2018  Elsevier Interactive Patient Education  2019 [employer]

## 2019-01-15 NOTE — Progress Notes (Signed)
MRN : [id number]  Christian Fields is a 63 y.o. (Apr 30, 1956) male who presents with chief complaint of  Chief Complaint  Patient presents with  . Follow-up  .  History of Present Illness: Patient returns today in follow up prior to scheduled visit on referral from his primary care physician for an abdominal aortic aneurysm.  He is followed for some mild peripheral arterial disease and was seen a few months ago.  Since that time, he is undergone a CT scan for other reasons were an incidental finding of a 3.3 cm distal abdominal aortic aneurysm was seen.  As such, he was referred for further evaluation and treatment.  No obvious aneurysm related symptoms. Specifically, the patient denies new back or abdominal pain, or signs of peripheral embolization   Current Outpatient Medications  Medication Sig Dispense Refill  . albuterol (PROVENTIL) (2.5 MG/3ML) 0.083% nebulizer solution 2.5 mg.    . alendronate (FOSAMAX) 70 MG tablet Take 70 mg by mouth once a week.     [name] Kitchen allopurinol (ZYLOPRIM) 100 MG tablet Take 1 tablet by mouth daily.  11  . aspirin EC 81 MG tablet Take 1 tablet by mouth daily.    . budesonide-formoterol (SYMBICORT) 160-4.5 MCG/ACT inhaler Inhale 2 puffs into the lungs 2 (two) times daily.    . buprenorphine-naloxone (SUBOXONE) 8-2 mg SUBL SL tablet Place 1 tablet under the tongue daily.    . cholecalciferol (VITAMIN D) 1000 units tablet Take 1,000 Units by mouth daily.    . clonazePAM (KLONOPIN) 1 MG tablet Take 1 mg by mouth 3 (three) times daily as needed.   0  . DULoxetine (CYMBALTA) 30 MG capsule Take 1 capsule by mouth daily.    . ferrous sulfate 325 (65 FE) MG tablet Take 325 mg by mouth daily with breakfast.    . fluticasone (FLONASE) 50 MCG/ACT nasal spray Place 2 sprays into both nostrils daily.  5  . gabapentin (NEURONTIN) 800 MG tablet Take 800 mg by mouth 4 (four) times daily.  0  . gemfibrozil (LOPID) 600 MG tablet Take 600 mg by mouth 2 (two) times daily before a  meal.     . hydrochlorothiazide (HYDRODIURIL) 25 MG tablet Take 25 mg by mouth daily.     [name] Kitchen HYDROcodone-acetaminophen (NORCO) 5-325 MG tablet Take 1 tablet by mouth every 6 (six) hours as needed for moderate pain. 30 tablet 0  . ipratropium (ATROVENT) 0.02 % nebulizer solution Inhale 2.5 mL (500 mcg total) by nebulization every 6 (six) hours.    [name] Kitchen KOMBIGLYZE XR 2.04-999 MG TB24 Take 1 tablet by mouth 2 (two) times daily.    . pantoprazole (PROTONIX) 40 MG tablet Take 40 mg by mouth daily.  1  . PRADAXA 150 MG CAPS capsule Take 150 mg by mouth 2 (two) times daily.     . simvastatin (ZOCOR) 40 MG tablet Take 1 tablet by mouth daily at 6 PM.    . SPIRIVA HANDIHALER 18 MCG inhalation capsule Place 1 capsule into inhaler and inhale daily.    . varenicline (CHANTIX PAK) 0.5 MG X 11 & 1 MG X 42 tablet Take by mouth 2 (two) times daily. Take one 0.5 mg tablet by mouth once daily for 3 days, then increase to one 0.5 mg tablet twice daily for 4 days, then increase to one 1 mg tablet twice daily.     No current facility-administered medications for this visit.     Past Medical History:  Diagnosis Date  .  Anemia   . Anxiety   . Arthritis   . Asthma   . Cancer ([place])    Basal Cell Skin Cancer  . Chronic back pain   . COPD (chronic obstructive pulmonary disease) ([place])   . Depression   . Diabetes mellitus ([place])   . Dyspnea   . GERD (gastroesophageal reflux disease)   . Gout   . Gout   . Headache   . History of blood clots    Left Leg--July 2018  . History of kidney stones   . Hyperlipidemia   . Hyperlipidemia   . Hypertension   . Kidney stones   . Neuropathy   . On home oxygen therapy    2 L / M  . Pneumonia 06/2017  . Sleep apnea   . Ulcer of foot ([place])    Right    Past Surgical History:  Procedure Laterality Date  . APPENDECTOMY    . DG FEET 2 VIEWS BILAT    . LIPOMA EXCISION Right 08/15/2017   Procedure: EXCISION TUMOR(CYST) FOOT;  Surgeon: Albertine Patricia, DPM;  Location: [facility]  ORS;  Service: Podiatry;  Laterality: Right;  . OTHER SURGICAL HISTORY Bilateral Foot surgery   Family History  Problem Relation Age of Onset  . Hypertension Mother   No bleeding disorders, clotting disorders, autoimmune diseases, or aneurysms  Social History       Social History  Substance Use Topics  . Smoking status: Current Every Day Smoker    Packs/day: 0.50    Types: Cigarettes  . Smokeless tobacco: Never Used  . Alcohol use 0.6 oz/week     1 Cans of beer per week   No IV drug use      Allergies  Allergen Reactions  . Bee Venom Anaphylaxis  . Hydrocodone-Acetaminophen Itching       REVIEW OF SYSTEMS(Negative unless checked)  Constitutional: [] ?Weight loss[] ?Fever[] ?Chills Cardiac:[] ?Chest pain[] ?Chest pressure[] ?Palpitations [] ?Shortness of breath when laying flat [] ?Shortness of breath at rest [x] ?Shortness of breath with exertion. Vascular: [x] ?Pain in legs with walking[] ?Pain in legsat rest[] ?Pain in legs when laying flat [x] ?Claudication [] ?Pain in feet when walking [] ?Pain in feet at rest [] ?Pain in feet when laying flat [] ?History of DVT [] ?Phlebitis [] ?Swelling in legs [] ?Varicose veins [] ?Non-healing ulcers Pulmonary: [x] ?Uses home oxygen [] ?Productive cough[] ?Hemoptysis [] ?Wheeze [x] ?COPD [] ?Asthma Neurologic: [] ?Dizziness [] ?Blackouts [] ?Seizures [] ?History of stroke [] ?History of TIA[] ?Aphasia [] ?Temporary blindness[] ?Dysphagia [] ?Weaknessor numbness in arms [] ?Weakness or numbnessin legs Musculoskeletal: [x] ?Arthritis [] ?Joint swelling [] ?Joint pain [x] ?Low back pain Hematologic:[] ?Easy bruising[] ?Easy bleeding [] ?Hypercoagulable state [] ?Anemic [] ?Hepatitis Gastrointestinal:[] ?Blood in stool[] ?Vomiting blood[] ?Gastroesophageal reflux/heartburn[] ?Abdominal pain Genitourinary: [] ?Chronic kidney disease [] ?Difficulturination  [] ?Frequenturination [] ?Burning with urination[] ?Hematuria Skin: [] ?Rashes [] ?Ulcers [] ?Wounds Psychological: [] ?History of anxiety[] ?History of major depression.    Physical Examination  BP 99/62 (BP Location: Right Arm, Patient Position: Sitting)   Pulse 93   Resp 14   Ht 5\' 8"  (1.727 m)   Wt 159 lb 12.8 oz (72.5 kg)   BMI 24.30 kg/m  Gen:  WD/WN, NAD Head: [place]/AT, No temporalis wasting. Ear/Nose/Throat: Hearing grossly intact, nares w/o erythema or drainage Eyes: Conjunctiva clear. Sclera non-icteric Neck: Supple.  Trachea midline Pulmonary:  Good air movement, no use of accessory muscles on supplemental oxygen.  Cardiac: RRR, no JVD Vascular:  Vessel Right Left  Radial Palpable Palpable                          PT Palpable Palpable  DP Palpable Palpable   Gastrointestinal: soft, non-tender/non-distended. Aorta not easily palpable Musculoskeletal: M/S  5/5 throughout.  No deformity or atrophy.  Neurologic: Sensation grossly intact in extremities.  Symmetrical.  Speech is fluent.  Psychiatric: Judgment intact, Mood & affect appropriate for pt's clinical situation. Dermatologic: No rashes or ulcers noted.  No cellulitis or open wounds.       Labs No results found for this or any previous visit (from the past 2160 hour(s)).  Radiology No results found.  Assessment/Plan Hypertension blood pressure control important in reducing the progression of atherosclerotic disease. On appropriate oral medications.   Diabetes mellitus ([place]) blood glucose control important in reducing the progression of atherosclerotic disease. Also, involved in wound healing. On appropriate medications.   Hyperlipidemia lipid control important in reducing the progression of atherosclerotic disease. Continue statin therapy  Atherosclerosis of native arteries of extremity with intermittent claudication ([place]) To be checked later this year  AAA (abdominal aortic  aneurysm) without rupture ([place]) The patient has a 3.3 cm distal abdominal aortic aneurysm seen on a CT a couple of months ago.  We discussed the pathophysiology and natural history of aneurysm disease.  This is a small aneurysm and does not require any immediate treatment.  When he comes back for his follow-up studies on his legs in November, we will check an aortic duplex at that time.    Leotis Pain, MD  01/15/2019 4:05 PM    This note was created with Dragon medical transcription system.  Any errors from dictation are purely unintentional

## 2019-01-15 NOTE — Assessment & Plan Note (Signed)
To be checked later this year

## 2019-02-11 DIAGNOSIS — L03114 Cellulitis of left upper limb: Secondary | ICD-10-CM | POA: Insufficient documentation

## 2019-02-15 DIAGNOSIS — H2513 Age-related nuclear cataract, bilateral: Secondary | ICD-10-CM | POA: Insufficient documentation

## 2019-02-18 ENCOUNTER — Other Ambulatory Visit
Admission: RE | Admit: 2019-02-18 | Discharge: 2019-02-18 | Disposition: A | Discharge status: Home / Self Care | Payer: Medicare HMO | Source: Ambulatory Visit | Attending: Specialist | Admitting: Specialist

## 2019-02-18 DIAGNOSIS — L03114 Cellulitis of left upper limb: Secondary | ICD-10-CM | POA: Insufficient documentation

## 2019-02-21 LAB — AEROBIC CULTURE W GRAM STAIN (SUPERFICIAL SPECIMEN): Culture: NO GROWTH

## 2019-02-21 LAB — AEROBIC CULTURE  (SUPERFICIAL SPECIMEN)

## 2019-10-29 ENCOUNTER — Ambulatory Visit (INDEPENDENT_AMBULATORY_CARE_PROVIDER_SITE_OTHER): Payer: Medicare HMO

## 2019-10-29 ENCOUNTER — Other Ambulatory Visit: Payer: Self-pay

## 2019-10-29 ENCOUNTER — Encounter (INDEPENDENT_AMBULATORY_CARE_PROVIDER_SITE_OTHER): Payer: Self-pay | Admitting: Nurse Practitioner

## 2019-10-29 ENCOUNTER — Encounter (INDEPENDENT_AMBULATORY_CARE_PROVIDER_SITE_OTHER): Payer: Self-pay

## 2019-10-29 ENCOUNTER — Encounter (INDEPENDENT_AMBULATORY_CARE_PROVIDER_SITE_OTHER): Payer: Medicare HMO

## 2019-10-29 ENCOUNTER — Ambulatory Visit (INDEPENDENT_AMBULATORY_CARE_PROVIDER_SITE_OTHER): Payer: Medicare HMO | Admitting: Nurse Practitioner

## 2019-10-29 ENCOUNTER — Ambulatory Visit (INDEPENDENT_AMBULATORY_CARE_PROVIDER_SITE_OTHER): Payer: Medicare HMO | Admitting: Vascular Surgery

## 2019-10-29 VITALS — BP 153/85 | HR 95 | Resp 16 | Wt 176.0 lb

## 2019-10-29 DIAGNOSIS — I70212 Atherosclerosis of native arteries of extremities with intermittent claudication, left leg: Secondary | ICD-10-CM

## 2019-10-29 DIAGNOSIS — I714 Abdominal aortic aneurysm, without rupture, unspecified: Secondary | ICD-10-CM

## 2019-10-29 DIAGNOSIS — I10 Essential (primary) hypertension: Secondary | ICD-10-CM | POA: Diagnosis not present

## 2019-10-29 DIAGNOSIS — K649 Unspecified hemorrhoids: Secondary | ICD-10-CM | POA: Insufficient documentation

## 2019-10-29 DIAGNOSIS — M1990 Unspecified osteoarthritis, unspecified site: Secondary | ICD-10-CM | POA: Insufficient documentation

## 2019-10-31 ENCOUNTER — Encounter (INDEPENDENT_AMBULATORY_CARE_PROVIDER_SITE_OTHER): Payer: Self-pay | Admitting: Nurse Practitioner

## 2019-10-31 NOTE — Progress Notes (Signed)
SUBJECTIVE:  Patient ID: Christian Fields, male    DOB: 05-24-1956, 63 y.o.   MRN: [id number] Chief Complaint  Patient presents with  . Follow-up    ultrasound follow up    HPI  Christian Fields is a 63 y.o. male The patient returns to the office for surveillance of a known abdominal aortic aneurysm. Patient denies abdominal pain or back pain, no other abdominal complaints. No changes suggesting embolic episodes.   There have been no interval changes in the patient's overall health care since his last visit.  Patient denies amaurosis fugax or TIA symptoms. There is no history of claudication or rest pain symptoms of the lower extremities. The patient denies angina or shortness of breath.   Duplex US of the aorta and iliac arteries shows an AAA measured 3.3 cm with no iliac artery aneurysms.  There have been no interval changes in lower extremity symptoms. No interval shortening of the patient's claudication distance or development of rest pain symptoms. No new ulcers or wounds have occurred since the last visit.  The patient denies history of DVT, PE or superficial thrombophlebitis.   ABI Rt=1.07 and Lt=1.04  Duplex ultrasound of the bilateral tibial arteries reveals strong monophasic tibial artery waveforms with strong toe waveforms bilaterally  Past Medical History:  Diagnosis Date  . Anemia   . Anxiety   . Arthritis   . Asthma   . Cancer ([place])    Basal Cell Skin Cancer  . Chronic back pain   . COPD (chronic obstructive pulmonary disease) ([place])   . Depression   . Diabetes mellitus ([place])   . Dyspnea   . GERD (gastroesophageal reflux disease)   . Gout   . Gout   . Headache   . History of blood clots    Left Leg--July 2018  . History of kidney stones   . Hyperlipidemia   . Hyperlipidemia   . Hypertension   . Kidney stones   . Neuropathy   . On home oxygen therapy    2 L / M  . Pneumonia 06/2017  . Sleep apnea   . Ulcer of foot ([place])    Right    Past Surgical  History:  Procedure Laterality Date  . APPENDECTOMY    . DG FEET 2 VIEWS BILAT    . LIPOMA EXCISION Right 08/15/2017   Procedure: EXCISION TUMOR(CYST) FOOT;  Surgeon: Albertine Patricia, DPM;  Location: [facility] ORS;  Service: Podiatry;  Laterality: Right;  . OTHER SURGICAL HISTORY Bilateral Foot surgery    Social History   Socioeconomic History  . Marital status: Widowed    Spouse name: Not on file  . Number of children: Not on file  . Years of education: Not on file  . Highest education level: Not on file  Occupational History  . Not on file  Social Needs  . Financial resource strain: Not on file  . Food insecurity    Worry: Not on file    Inability: Not on file  . Transportation needs    Medical: Not on file    Non-medical: Not on file  Tobacco Use  . Smoking status: Former Smoker    Packs/day: 0.50    Types: Cigarettes    Start date: 2018  . Smokeless tobacco: Never Used  Substance and Sexual Activity  . Alcohol use: Yes    Alcohol/week: 1.0 standard drinks    Types: 1 Cans of beer per week    Comment: occ  . Drug  use: No  . Sexual activity: Not on file  Lifestyle  . Physical activity    Days per week: Not on file    Minutes per session: Not on file  . Stress: Not on file  Relationships  . Social Herbalist on phone: Not on file    Gets together: Not on file    Attends religious service: Not on file    Active member of club or organization: Not on file    Attends meetings of clubs or organizations: Not on file    Relationship status: Not on file  . Intimate partner violence    Fear of current or ex partner: Not on file    Emotionally abused: Not on file    Physically abused: Not on file    Forced sexual activity: Not on file  Other Topics Concern  . Not on file  Social History Narrative  . Not on file    Family History  Problem Relation Age of Onset  . Hypertension Mother     Allergies  Allergen Reactions  . Bee Venom Anaphylaxis  .  Hydrocodone-Acetaminophen Itching     Review of Systems   Review of Systems: Negative Unless Checked Constitutional: [] Weight loss  [] Fever  [] Chills Cardiac: [] Chest pain   []  Atrial Fibrillation  [] Palpitations   [] Shortness of breath when laying flat   [] Shortness of breath with exertion. [] Shortness of breath at rest Vascular:  [] Pain in legs with walking   [] Pain in legs with standing [] Pain in legs when laying flat   [x] Claudication    [] Pain in feet when laying flat    [] History of DVT   [] Phlebitis   [] Swelling in legs   [] Varicose veins   [] Non-healing ulcers Pulmonary:   [] Uses home oxygen   [] Productive cough   [] Hemoptysis   [] Wheeze  [] COPD   [] Asthma Neurologic:  [] Dizziness   [] Seizures  [] Blackouts [] History of stroke   [] History of TIA  [] Aphasia   [] Temporary Blindness   [] Weakness or numbness in arm   [] Weakness or numbness in leg Musculoskeletal:   [] Joint swelling   [] Joint pain   [x] Low back pain  []  History of Knee Replacement [x] Arthritis [] back Surgeries  []  Spinal Stenosis    Hematologic:  [] Easy bruising  [] Easy bleeding   [] Hypercoagulable state   [] Anemic Gastrointestinal:  [] Diarrhea   [] Vomiting  [] Gastroesophageal reflux/heartburn   [] Difficulty swallowing. [] Abdominal pain Genitourinary:  [] Chronic kidney disease   [] Difficult urination  [] Anuric   [] Blood in urine [] Frequent urination  [] Burning with urination   [] Hematuria Skin:  [] Rashes   [] Ulcers [] Wounds Psychological:  [] History of anxiety   [x]  History of major depression  []  Memory Difficulties      OBJECTIVE:   Physical Exam  BP (!) 153/85 (BP Location: Right Arm)   Pulse 95   Resp 16   Wt 176 lb (79.8 kg)   BMI 26.76 kg/m   Gen: WD/WN, NAD Head: [place]/AT, No temporalis wasting.  Ear/Nose/Throat: Hearing grossly intact, nares w/o erythema or drainage Eyes: PER, EOMI, sclera nonicteric.  Neck: Supple, no masses.  No JVD.  Pulmonary:  Good air movement, no use of accessory muscles.  Cardiac:  RRR Vascular:  Vessel Right Left  Radial Palpable Palpable  Dorsalis Pedis Palpable Palpable  Posterior Tibial Palpable Palpable   Gastrointestinal: soft, non-distended. No guarding/no peritoneal signs.  Musculoskeletal: M/S 5/5 throughout.  No deformity or atrophy.  Neurologic: Pain and light touch intact in extremities.  Symmetrical.  Speech is fluent. Motor exam as listed above. Psychiatric: Judgment intact, Mood & affect appropriate for pt's clinical situation. Dermatologic: No Venous rashes. No Ulcers Noted.  No changes consistent with cellulitis. Lymph : No Cervical lymphadenopathy, no lichenification or skin changes of chronic lymphedema.       ASSESSMENT AND PLAN:  1. Atherosclerosis of native artery of left lower extremity with intermittent claudication (HCC)  Recommend:  The patient has evidence of atherosclerosis of the lower extremities with claudication.  The patient does not voice lifestyle limiting changes at this point in time.  Noninvasive studies do not suggest clinically significant change.  No invasive studies, angiography or surgery at this time The patient should continue walking and begin a more formal exercise program.  The patient should continue antiplatelet therapy and aggressive treatment of the lipid abnormalities  No changes in the patient's medications at this time  The patient should continue wearing graduated compression socks 10-15 mmHg strength to control the mild edema.    2. AAA (abdominal aortic aneurysm) without rupture (HCC) No surgery or intervention at this time. The patient has an asymptomatic abdominal aortic aneurysm that is less than 4 cm in maximal diameter.  I have discussed the natural history of abdominal aortic aneurysm and the small risk of rupture for aneurysm less than 5 cm in size.  However, as these small aneurysms tend to enlarge over time, continued surveillance with ultrasound or CT scan is mandatory.  I have also  discussed optimizing medical management with hypertension and lipid control and the importance of abstinence from tobacco.  The patient is also encouraged to exercise a minimum of 30 minutes 4 times a week.  Should the patient develop new onset abdominal or back pain or signs of peripheral embolization they are instructed to seek medical attention immediately and to alert the physician providing care that they have an aneurysm.  The patient voices their understanding. The patient will return in 12 months with an aortic duplex.   3. Essential hypertension Continue antihypertensive medications as already ordered, these medications have been reviewed and there are no changes at this time.    Current Outpatient Medications on File Prior to Visit  Medication Sig Dispense Refill  . albuterol (PROVENTIL) (2.5 MG/3ML) 0.083% nebulizer solution 2.5 mg.    . alendronate (FOSAMAX) 70 MG tablet Take 70 mg by mouth once a week.     [name] Kitchen allopurinol (ZYLOPRIM) 100 MG tablet Take 1 tablet by mouth daily.  11  . aspirin EC 81 MG tablet Take 1 tablet by mouth daily.    . budesonide-formoterol (SYMBICORT) 160-4.5 MCG/ACT inhaler Inhale 2 puffs into the lungs 2 (two) times daily.    . buprenorphine-naloxone (SUBOXONE) 8-2 mg SUBL SL tablet Place 1 tablet under the tongue daily.    . cholecalciferol (VITAMIN D) 1000 units tablet Take 1,000 Units by mouth daily.    . clonazePAM (KLONOPIN) 1 MG tablet Take 1 mg by mouth 3 (three) times daily as needed.   0  . DULoxetine (CYMBALTA) 30 MG capsule Take 1 capsule by mouth daily.    . ferrous sulfate 325 (65 FE) MG tablet Take 325 mg by mouth daily with breakfast.    . fluticasone (FLONASE) 50 MCG/ACT nasal spray Place 2 sprays into both nostrils daily.  5  . gabapentin (NEURONTIN) 800 MG tablet Take 800 mg by mouth 4 (four) times daily.  0  . gemfibrozil (LOPID) 600 MG tablet Take 600 mg by mouth 2 (two)  times daily before a meal.     . hydrochlorothiazide  (HYDRODIURIL) 25 MG tablet Take 25 mg by mouth daily.     [name] Kitchen HYDROcodone-acetaminophen (NORCO) 5-325 MG tablet Take 1 tablet by mouth every 6 (six) hours as needed for moderate pain. 30 tablet 0  . ipratropium (ATROVENT) 0.02 % nebulizer solution Inhale 2.5 mL (500 mcg total) by nebulization every 6 (six) hours.    [name] Kitchen KOMBIGLYZE XR 2.04-999 MG TB24 Take 1 tablet by mouth 2 (two) times daily.    . pantoprazole (PROTONIX) 40 MG tablet Take 40 mg by mouth daily.  1  . PRADAXA 150 MG CAPS capsule Take 150 mg by mouth 2 (two) times daily.     . simvastatin (ZOCOR) 40 MG tablet Take 1 tablet by mouth daily at 6 PM.    . SPIRIVA HANDIHALER 18 MCG inhalation capsule Place 1 capsule into inhaler and inhale daily.    . varenicline (CHANTIX PAK) 0.5 MG X 11 & 1 MG X 42 tablet Take by mouth 2 (two) times daily. Take one 0.5 mg tablet by mouth once daily for 3 days, then increase to one 0.5 mg tablet twice daily for 4 days, then increase to one 1 mg tablet twice daily.     No current facility-administered medications on file prior to visit.     There are no Patient Instructions on file for this visit. No follow-ups on file.   Kris Hartmann, NP  This note was completed with Sales executive.  Any errors are purely unintentional.

## 2019-11-02 ENCOUNTER — Encounter (INDEPENDENT_AMBULATORY_CARE_PROVIDER_SITE_OTHER): Payer: Medicare HMO

## 2020-01-12 DIAGNOSIS — U071 COVID-19: Secondary | ICD-10-CM | POA: Insufficient documentation

## 2020-01-12 DIAGNOSIS — J431 Panlobular emphysema: Secondary | ICD-10-CM | POA: Insufficient documentation

## 2020-01-12 DIAGNOSIS — L03116 Cellulitis of left lower limb: Secondary | ICD-10-CM | POA: Insufficient documentation

## 2020-01-12 DIAGNOSIS — E11621 Type 2 diabetes mellitus with foot ulcer: Secondary | ICD-10-CM | POA: Insufficient documentation

## 2020-01-13 DIAGNOSIS — N179 Acute kidney failure, unspecified: Secondary | ICD-10-CM | POA: Insufficient documentation

## 2020-02-25 ENCOUNTER — Ambulatory Visit: Payer: Medicare HMO | Attending: Internal Medicine

## 2020-02-25 DIAGNOSIS — Z23 Encounter for immunization: Secondary | ICD-10-CM

## 2020-02-25 NOTE — Progress Notes (Signed)
   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 He was provided with Vaccine Information Sheet and instruction to access the V-Safe system.   [name] was instructed to call 911 with any severe reactions post vaccine: [name] Kitchen Difficulty breathing  . Swelling of face and throat  . A fast heartbeat  . A bad rash all over body  . Dizziness and weakness   Immunizations Administered    Name Date Dose VIS Date Route   Pfizer COVID-19 Vaccine 02/25/2020  9:21 AM 0.3 mL 11/19/2019 Intramuscular   Manufacturer: [place]   Lot: [id number]   [place]: [id number]

## 2020-03-22 ENCOUNTER — Ambulatory Visit: Payer: Medicare HMO

## 2020-03-25 ENCOUNTER — Inpatient Hospital Stay
Admission: EM | Admit: 2020-03-25 | Discharge: 2020-03-28 | DRG: 917 | Disposition: A | Discharge status: Home / Self Care | Payer: Medicare HMO | Attending: Internal Medicine | Admitting: Internal Medicine

## 2020-03-25 ENCOUNTER — Other Ambulatory Visit: Payer: Self-pay

## 2020-03-25 ENCOUNTER — Emergency Department: Payer: Medicare HMO

## 2020-03-25 DIAGNOSIS — J690 Pneumonitis due to inhalation of food and vomit: Secondary | ICD-10-CM | POA: Diagnosis present

## 2020-03-25 DIAGNOSIS — G4730 Sleep apnea, unspecified: Secondary | ICD-10-CM | POA: Diagnosis present

## 2020-03-25 DIAGNOSIS — L97529 Non-pressure chronic ulcer of other part of left foot with unspecified severity: Secondary | ICD-10-CM

## 2020-03-25 DIAGNOSIS — T50901A Poisoning by unspecified drugs, medicaments and biological substances, accidental (unintentional), initial encounter: Secondary | ICD-10-CM | POA: Diagnosis not present

## 2020-03-25 DIAGNOSIS — G9341 Metabolic encephalopathy: Secondary | ICD-10-CM | POA: Diagnosis present

## 2020-03-25 DIAGNOSIS — Z79899 Other long term (current) drug therapy: Secondary | ICD-10-CM

## 2020-03-25 DIAGNOSIS — I70219 Atherosclerosis of native arteries of extremities with intermittent claudication, unspecified extremity: Secondary | ICD-10-CM | POA: Diagnosis present

## 2020-03-25 DIAGNOSIS — J9601 Acute respiratory failure with hypoxia: Secondary | ICD-10-CM

## 2020-03-25 DIAGNOSIS — I70212 Atherosclerosis of native arteries of extremities with intermittent claudication, left leg: Secondary | ICD-10-CM

## 2020-03-25 DIAGNOSIS — Z8249 Family history of ischemic heart disease and other diseases of the circulatory system: Secondary | ICD-10-CM

## 2020-03-25 DIAGNOSIS — K219 Gastro-esophageal reflux disease without esophagitis: Secondary | ICD-10-CM | POA: Diagnosis present

## 2020-03-25 DIAGNOSIS — R410 Disorientation, unspecified: Secondary | ICD-10-CM

## 2020-03-25 DIAGNOSIS — M549 Dorsalgia, unspecified: Secondary | ICD-10-CM | POA: Diagnosis present

## 2020-03-25 DIAGNOSIS — J9600 Acute respiratory failure, unspecified whether with hypoxia or hypercapnia: Secondary | ICD-10-CM | POA: Diagnosis present

## 2020-03-25 DIAGNOSIS — J189 Pneumonia, unspecified organism: Secondary | ICD-10-CM | POA: Diagnosis not present

## 2020-03-25 DIAGNOSIS — Z91030 Bee allergy status: Secondary | ICD-10-CM

## 2020-03-25 DIAGNOSIS — E11621 Type 2 diabetes mellitus with foot ulcer: Secondary | ICD-10-CM | POA: Diagnosis present

## 2020-03-25 DIAGNOSIS — I714 Abdominal aortic aneurysm, without rupture: Secondary | ICD-10-CM | POA: Diagnosis present

## 2020-03-25 DIAGNOSIS — I10 Essential (primary) hypertension: Secondary | ICD-10-CM | POA: Diagnosis present

## 2020-03-25 DIAGNOSIS — Z20822 Contact with and (suspected) exposure to covid-19: Secondary | ICD-10-CM | POA: Diagnosis present

## 2020-03-25 DIAGNOSIS — Z9981 Dependence on supplemental oxygen: Secondary | ICD-10-CM

## 2020-03-25 DIAGNOSIS — Z85828 Personal history of other malignant neoplasm of skin: Secondary | ICD-10-CM

## 2020-03-25 DIAGNOSIS — J449 Chronic obstructive pulmonary disease, unspecified: Secondary | ICD-10-CM | POA: Diagnosis not present

## 2020-03-25 DIAGNOSIS — M1990 Unspecified osteoarthritis, unspecified site: Secondary | ICD-10-CM | POA: Diagnosis present

## 2020-03-25 DIAGNOSIS — Z7951 Long term (current) use of inhaled steroids: Secondary | ICD-10-CM

## 2020-03-25 DIAGNOSIS — E1151 Type 2 diabetes mellitus with diabetic peripheral angiopathy without gangrene: Secondary | ICD-10-CM | POA: Diagnosis present

## 2020-03-25 DIAGNOSIS — E119 Type 2 diabetes mellitus without complications: Secondary | ICD-10-CM

## 2020-03-25 DIAGNOSIS — Z885 Allergy status to narcotic agent status: Secondary | ICD-10-CM

## 2020-03-25 DIAGNOSIS — Z79891 Long term (current) use of opiate analgesic: Secondary | ICD-10-CM

## 2020-03-25 DIAGNOSIS — Z7982 Long term (current) use of aspirin: Secondary | ICD-10-CM

## 2020-03-25 DIAGNOSIS — J9621 Acute and chronic respiratory failure with hypoxia: Secondary | ICD-10-CM | POA: Diagnosis present

## 2020-03-25 DIAGNOSIS — L97509 Non-pressure chronic ulcer of other part of unspecified foot with unspecified severity: Secondary | ICD-10-CM | POA: Diagnosis present

## 2020-03-25 DIAGNOSIS — Z8673 Personal history of transient ischemic attack (TIA), and cerebral infarction without residual deficits: Secondary | ICD-10-CM

## 2020-03-25 DIAGNOSIS — G8929 Other chronic pain: Secondary | ICD-10-CM | POA: Diagnosis present

## 2020-03-25 DIAGNOSIS — M109 Gout, unspecified: Secondary | ICD-10-CM | POA: Diagnosis present

## 2020-03-25 DIAGNOSIS — G629 Polyneuropathy, unspecified: Secondary | ICD-10-CM | POA: Diagnosis present

## 2020-03-25 DIAGNOSIS — J9622 Acute and chronic respiratory failure with hypercapnia: Secondary | ICD-10-CM | POA: Diagnosis present

## 2020-03-25 DIAGNOSIS — E785 Hyperlipidemia, unspecified: Secondary | ICD-10-CM | POA: Diagnosis present

## 2020-03-25 DIAGNOSIS — Z87891 Personal history of nicotine dependence: Secondary | ICD-10-CM

## 2020-03-25 DIAGNOSIS — Z7983 Long term (current) use of bisphosphonates: Secondary | ICD-10-CM

## 2020-03-25 LAB — COMPREHENSIVE METABOLIC PANEL
ALT: 11 U/L (ref 0–44)
AST: 15 U/L (ref 15–41)
Albumin: 3.1 g/dL — ABNORMAL LOW (ref 3.5–5.0)
Alkaline Phosphatase: 104 U/L (ref 38–126)
Anion gap: 12 (ref 5–15)
BUN: 12 mg/dL (ref 8–23)
CO2: 33 mmol/L — ABNORMAL HIGH (ref 22–32)
Calcium: 8.6 mg/dL — ABNORMAL LOW (ref 8.9–10.3)
Chloride: 93 mmol/L — ABNORMAL LOW (ref 98–111)
Creatinine, Ser: 1.14 mg/dL (ref 0.61–1.24)
GFR calc Af Amer: >60 mL/min (ref >60)
GFR calc non Af Amer: >60 mL/min (ref >60)
Glucose, Bld: 125 mg/dL — ABNORMAL HIGH (ref 70–99)
Potassium: 3.7 mmol/L (ref 3.5–5.1)
Sodium: 138 mmol/L (ref 135–145)
Total Bilirubin: 0.7 mg/dL (ref 0.3–1.2)
Total Protein: 7.8 g/dL (ref 6.5–8.1)

## 2020-03-25 LAB — CBC WITH DIFFERENTIAL/PLATELET
Abs Immature Granulocytes: 0.08 10*3/uL — ABNORMAL HIGH (ref 0.00–0.07)
Basophils Absolute: 0.1 10*3/uL (ref 0.0–0.1)
Basophils Relative: 0 %
Eosinophils Absolute: 0 10*3/uL (ref 0.0–0.5)
Eosinophils Relative: 0 %
HCT: 30.9 % — ABNORMAL LOW (ref 39.0–52.0)
Hemoglobin: 10.1 g/dL — ABNORMAL LOW (ref 13.0–17.0)
Immature Granulocytes: 1 %
Lymphocytes Relative: 6 %
Lymphs Abs: 0.9 10*3/uL (ref 0.7–4.0)
MCH: 29.8 pg (ref 26.0–34.0)
MCHC: 32.7 g/dL (ref 30.0–36.0)
MCV: 91.2 fL (ref 80.0–100.0)
Monocytes Absolute: 1.1 10*3/uL — ABNORMAL HIGH (ref 0.1–1.0)
Monocytes Relative: 7 %
Neutro Abs: 14.4 10*3/uL — ABNORMAL HIGH (ref 1.7–7.7)
Neutrophils Relative %: 86 %
Platelets: 344 10*3/uL (ref 150–400)
RBC: 3.39 MIL/uL — ABNORMAL LOW (ref 4.22–5.81)
RDW: 16.2 % — ABNORMAL HIGH (ref 11.5–15.5)
WBC: 16.6 10*3/uL — ABNORMAL HIGH (ref 4.0–10.5)
nRBC: 0 % (ref 0.0–0.2)

## 2020-03-25 LAB — PROTIME-INR
INR: 1.1 (ref 0.8–1.2)
Prothrombin Time: 13.9 seconds (ref 11.4–15.2)

## 2020-03-25 LAB — ETHANOL: Alcohol, Ethyl (B): <10 mg/dL (ref <10)

## 2020-03-25 LAB — LIPASE, BLOOD: Lipase: 18 U/L (ref 11–51)

## 2020-03-25 LAB — SALICYLATE LEVEL: Salicylate Lvl: <7 mg/dL — ABNORMAL LOW (ref 7.0–30.0)

## 2020-03-25 LAB — PROCALCITONIN: Procalcitonin: 0.21 ng/mL

## 2020-03-25 LAB — POC SARS CORONAVIRUS 2 AG: SARS Coronavirus 2 Ag: NEGATIVE

## 2020-03-25 LAB — ACETAMINOPHEN LEVEL: Acetaminophen (Tylenol), Serum: <10 ug/mL — ABNORMAL LOW (ref 10–30)

## 2020-03-25 MED ORDER — SODIUM CHLORIDE 0.9 % IV BOLUS
1000.0000 mL | Freq: Once | INTRAVENOUS | Status: AC
  Administered 2020-03-25: 1000 mL via INTRAVENOUS

## 2020-03-25 MED ORDER — INSULIN ASPART 100 UNIT/ML ~~LOC~~ SOLN
0.0000 [IU] | Freq: Three times a day (TID) | SUBCUTANEOUS | Status: DC
  Administered 2020-03-27 – 2020-03-28 (×2): 2 [IU] via SUBCUTANEOUS
  Administered 2020-03-28: 3 [IU] via SUBCUTANEOUS
  Filled 2020-03-25 (×4): qty 1

## 2020-03-25 MED ORDER — SODIUM CHLORIDE 0.9 % IV SOLN
2.0000 g | INTRAVENOUS | Status: DC
  Administered 2020-03-25 – 2020-03-27 (×3): 2 g via INTRAVENOUS
  Filled 2020-03-25: qty 2
  Filled 2020-03-25: qty 20
  Filled 2020-03-25: qty 2
  Filled 2020-03-25: qty 20

## 2020-03-25 MED ORDER — SODIUM CHLORIDE 0.9 % IV SOLN
500.0000 mg | INTRAVENOUS | Status: DC
  Administered 2020-03-25 – 2020-03-27 (×3): 500 mg via INTRAVENOUS
  Filled 2020-03-25 (×4): qty 500

## 2020-03-25 NOTE — H&P (Signed)
History and Physical        Hospital Admission Note Date: 03/25/2020  Patient name: Christian Fields Medical record number: [id number] Date of birth: 05-Sep-1956 Age: 64 y.o. Gender: male  PCP: Remi Haggard, FNP    Patient coming from: Home via EMS   I have reviewed all records in the [facility].    Chief Complaint:  Drug Overdose   Level 5 Caveat: Portions of the History and Physical including HPI and review of systems are unable to be completely obtained due to patient being a poor historian due to confusion  HPI: Christian Fields is 64 y.o. male with h/o HTN, HLD, COPD with chronic respiratory failure on 2 to 3L [place] O2, anxiety, chronic opiate use who is brought by EMS to ED for concern of medication overdose. Patient reports he doesn't think he took medications incorrectly and had no intent to harm himself. EMS reported that appeared accidental, but they note that home seen was disorganized, patient had urinated on himself in the floor, medication bottles were strewn about.  Patient reports he is feeling well and has no complaints. Denies chest pain, SOB, cough, vomiting, headaches, vision changes, diarrhea.    ED work-up/course:  Patient presents with confusion and suspected medication overdose from various pills.  Will check labs, chest x-ray, CT head.  IV fluids for hydration.  2117 Chest x-ray shows bilateral basilar opacities, concerning for pneumonia or possibly aspiration, especially considering patient's confusion.  Will start antibiotics, check urinalysis, plan to hospitalize. He is not septic.   [PS]  2228 Pt noted to be hypoxic on 4L  by nurse. Placed on NRB for now, repositioned in bed.    [PS]       Review of Systems: Per HPI  All other systems reviewed and are negative except as documented above in ROS and HPI.  Past Medical History: Past Medical History:  Diagnosis Date  . Anemia   .  Anxiety   . Arthritis   . Asthma   . Cancer ([place])    Basal Cell Skin Cancer  . Chronic back pain   . COPD (chronic obstructive pulmonary disease) ([place])   . Depression   . Diabetes mellitus ([place])   . Dyspnea   . GERD (gastroesophageal reflux disease)   . Gout   . Gout   . Headache   . History of blood clots    Left Leg--July 2018  . History of kidney stones   . Hyperlipidemia   . Hyperlipidemia   . Hypertension   . Kidney stones   . Neuropathy   . On home oxygen therapy    2 L / M  . Pneumonia 06/2017  . Sleep apnea   . Ulcer of foot ([place])    Right    Past Surgical History:  Procedure Laterality Date  . APPENDECTOMY    . DG FEET 2 VIEWS BILAT    . LIPOMA EXCISION Right 08/15/2017   Procedure: EXCISION TUMOR(CYST) FOOT;  Surgeon: Albertine Patricia, DPM;  Location: [facility] ORS;  Service: Podiatry;  Laterality: Right;  . OTHER SURGICAL HISTORY Bilateral Foot surgery    Medications: Prior to Admission medications  Medication Sig Start Date End Date Taking? Authorizing Provider  albuterol (PROVENTIL) (2.5 MG/3ML) 0.083% nebulizer solution 2.5 mg. 12/16/16 01/15/19  [provider]  alendronate (FOSAMAX) 70 MG tablet Take 70 mg by mouth once a week.  06/23/17   [provider]  allopurinol (ZYLOPRIM) 100 MG tablet Take 1 tablet by mouth daily. 01/02/16   [provider]  aspirin EC 81 MG tablet Take 1 tablet by mouth daily.    [provider]  budesonide-formoterol (SYMBICORT) 160-4.5 MCG/ACT inhaler Inhale 2 puffs into the lungs 2 (two) times daily.    [provider]  buprenorphine-naloxone (SUBOXONE) 8-2 mg SUBL SL tablet Place 1 tablet under the tongue daily.    [provider]  cholecalciferol (VITAMIN D) 1000 units tablet Take 1,000 Units by mouth daily.    [provider]  clonazePAM (KLONOPIN) 1 MG tablet Take 1 mg by mouth 3 (three) times daily as needed.  12/14/15   [provider]  DULoxetine (CYMBALTA)  30 MG capsule Take 1 capsule by mouth daily. 01/17/16   [provider]  ferrous sulfate 325 (65 FE) MG tablet Take 325 mg by mouth daily with breakfast.    [provider]  fluticasone (FLONASE) 50 MCG/ACT nasal spray Place 2 sprays into both nostrils daily. 12/18/15   [provider]  gabapentin (NEURONTIN) 800 MG tablet Take 800 mg by mouth 4 (four) times daily. 12/18/15   [provider]  gemfibrozil (LOPID) 600 MG tablet Take 600 mg by mouth 2 (two) times daily before a meal.  06/05/17   [provider]  hydrochlorothiazide (HYDRODIURIL) 25 MG tablet Take 25 mg by mouth daily.  01/17/16   [provider]  HYDROcodone-acetaminophen (NORCO) 5-325 MG tablet Take 1 tablet by mouth every 6 (six) hours as needed for moderate pain. 08/15/17   Troxler, Rodman Key, DPM  ipratropium (ATROVENT) 0.02 % nebulizer solution Inhale 2.5 mL (500 mcg total) by nebulization every 6 (six) hours. 12/16/16 01/15/19  [provider]  KOMBIGLYZE XR 2.04-999 MG TB24 Take 1 tablet by mouth 2 (two) times daily. 01/17/16   [provider]  pantoprazole (PROTONIX) 40 MG tablet Take 40 mg by mouth daily. 12/18/15   [provider]  PRADAXA 150 MG CAPS capsule Take 150 mg by mouth 2 (two) times daily.  06/23/17   [provider]  simvastatin (ZOCOR) 40 MG tablet Take 1 tablet by mouth daily at 6 PM. 01/17/16   [provider]  SPIRIVA HANDIHALER 18 MCG inhalation capsule Place 1 capsule into inhaler and inhale daily. 11/22/15   [provider]  varenicline (CHANTIX PAK) 0.5 MG X 11 & 1 MG X 42 tablet Take by mouth 2 (two) times daily. Take one 0.5 mg tablet by mouth once daily for 3 days, then increase to one 0.5 mg tablet twice daily for 4 days, then increase to one 1 mg tablet twice daily.    [provider]    Allergies:   Allergies  Allergen Reactions  . Bee Venom Anaphylaxis  . Hydrocodone-Acetaminophen Itching    Social  History:  reports that he has quit smoking. His smoking use included cigarettes. He started smoking about 3 years ago. He smoked 0.50 packs per day. He has never used smokeless tobacco. He reports current alcohol use of about 1.0 standard drinks of alcohol per week. He reports that he does not use drugs.  Family History: Family History  Problem Relation Age of Onset  . Hypertension  Mother     Physical Exam: Blood pressure 113/72, pulse 82, temperature 99.7 F (37.6 C), temperature source Oral, resp. rate (!) 21, height 5\' 8"  (1.727 m), weight 79.4 kg, SpO2 100 %. General: Alert, awake, oriented x2, in no acute distress. Eyes: pink conjunctiva,anicteric sclera, pupils equal and reactive to light and accomodation, HEENT: normocephalic, atraumatic, oropharynx clear, Dry mucous membranes.  Dried pill residue caked around the mouth and tongue. Neck: supple, no masses or lymphadenopathy, no goiter, no bruits, no JVD CVS: Regular rate and rhythm, without murmurs, rubs or gallops.  Resp :Normal respiratory effort. Non-rebreather mask in place. Crackles at left base. No wheezes.  GI : Soft, nontender, nondistended, positive bowel sounds, no masses. No hepatomegaly. No hernia.  Musculoskeletal: No clubbing or cyanosis, positive pedal pulses. No contracture. ROM intact  Neuro: Grossly intact, no focal neurological deficits, strength 5/5 upper and lower extremities bilaterally Psych: alert and oriented x 2 (to person and place), normal mood and affect Skin: Left LE with edema and redness (related to chronic ulcers). Charcot's joint present.    LABS on Admission: I have personally reviewed all the labs and imagings below    Basic Metabolic Panel: Recent Labs  Lab 03/25/20 2023  NA 138  K 3.7  CL 93*  CO2 33*  GLUCOSE 125*  BUN 12  CREATININE 1.14  CALCIUM 8.6*   Liver Function Tests: Recent Labs  Lab 03/25/20 2023  AST 15  ALT 11  ALKPHOS 104  BILITOT 0.7  PROT 7.8  ALBUMIN 3.1*     Recent Labs  Lab 03/25/20 2023  LIPASE 18   No results for input(s): AMMONIA in the last 168 hours. CBC: Recent Labs  Lab 03/25/20 2023  WBC 16.6*  NEUTROABS 14.4*  HGB 10.1*  HCT 30.9*  MCV 91.2  PLT 344   Cardiac Enzymes: No results for input(s): CKTOTAL, CKMB, CKMBINDEX, TROPONINI in the last 168 hours. BNP: Invalid input(s): POCBNP CBG: No results for input(s): GLUCAP in the last 168 hours.  Radiological Exams on Admission:  CT HEAD WO CONTRAST  Result Date: 03/25/2020 CLINICAL DATA:  Altered mental status and weakness today. EXAM: CT HEAD WITHOUT CONTRAST TECHNIQUE: Contiguous axial images were obtained from the base of the skull through the vertex without intravenous contrast. COMPARISON:  01/19/2016 FINDINGS: Brain: Moderate diffuse enlargement of the ventricles and subarachnoid spaces with progression. Mild to moderate patchy white matter low density in both cerebral hemispheres with progression. 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 No intracranial hemorrhage, mass lesion or CT evidence of acute infarction. Vascular: No hyperdense vessel or unexpected calcification. Skull: Normal. Negative for fracture or focal lesion. Sinuses/Orbits: Status post bilateral cataract extraction. Unremarkable bones and included paranasal sinuses. Other: None. IMPRESSION: 1. No acute abnormality. 2. Moderate diffuse cerebral and cerebellar atrophy with progression. 3. Mild to moderate chronic small vessel white matter ischemic changes in both cerebral hemispheres with progression. 4. 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 Electronically Signed   By: Claudie Revering M.D.   On: 03/25/2020 21:03   DG Chest Portable 1 View  Result Date: 03/25/2020 CLINICAL DATA:  Shortness of breath. Confusion. Medication overdose. EXAM: PORTABLE CHEST 1 VIEW COMPARISON:  Radiograph 05/12/2017 FINDINGS: Normal heart size and mediastinal contours. Aortic atherosclerosis. Patchy bilateral lung opacities  most prominent in the lower lung zones. No significant pleural fluid. No pneumothorax. Remote left rib fracture. IMPRESSION: Patchy bilateral lung opacities most prominent in the lower lung zones, may be atelectasis, aspiration, or pneumonia. Aortic  Atherosclerosis (ICD10-I70.0). Electronically Signed   By: Keith Rake M.D.   On: 03/25/2020 20:37      Assessment/Plan Active Problems:   Foot ulcer ([place])   Hypertension   Diabetes mellitus ([place])   Hyperlipidemia   Atherosclerosis of native arteries of extremity with intermittent claudication (HCC)   Acute on chronic respiratory failure with hypoxia and hypercapnia (HCC)   Chronic obstructive pulmonary disease, unspecified ([place])   Chronically on opiate therapy   Polypharmacy   Community acquired pneumonia   Acute on Chronic Respiratory Failure/CAP  Patient hypoxic on his home O2. Stable with non-rebreather. CXR with patchy bilateral lung opacities most prominent in lower lung zones that may be atelectasis, aspiration, or pneumonia. Patient is afebrile but does have leukocytosis to 16. Baseline PCT is 0.21.  -admit to MedSurg, continuous pulse ox  -continue supplemental O2, wean to home dose as tolerated  -continue CTX and Azithromycin  -COVID and RVP pending  -obtain urine antigens  -trend PCT  Altered Mental Status/Confusion  Patient presented with concern for medication overdose. Multiple pills in his medication list could cause altered mental status if taken excessively including Klonopin, Norco, Chantix, Cymbalta, Gabapentin, Suboxone.  Salicylate, ethanol, acetaminophen levels within normal limits. CT head showed no acute abnormalities. Infection could be potential source as well and this may be multi factorial.  -UDS pending  -hold above potential offending medications with the exception of Suboxone to avoid withdrawal symptoms  -neuro checks  -treat CAP as outlined above  -diet NPO pending bedside swallow screen   COPD    Does not have any symptoms or exam findings concerning for acute exacerbation.  -Dulera and Spiriva   T2DM  Last A1c <6.0.  -hold Kombiglyze  sensitive SSI  -CBGs AC/HS  HTN   BP Normotensive.  -continue HCTZ 25 mg    DVT prophylaxis: Lovenox   CODE STATUS: FULL   Consults called: None    Admission status: Observation   The medical decision making on this patient was of high complexity and the patient is at high risk for clinical deterioration, therefore this is a level 3 admission.  Severity of Illness:     Moderate  The appropriate patient status for this patient is OBSERVATION. Observation status is judged to be reasonable and necessary in order to provide the required intensity of service to ensure the patient's safety. The patient's presenting symptoms, physical exam findings, and initial radiographic and laboratory data in the context of their medical condition is felt to place them at decreased risk for further clinical deterioration. Furthermore, it is anticipated that the patient will be medically stable for discharge from the hospital within 2 midnights of admission. The following factors support the patient status of observation.   " The patient's presenting symptoms include confusion. " The physical exam findings include hypoxia, crackles on lung auscultation.  " The initial radiographic and laboratory data are CXR w/ bilateral lung opacities, leukocytosis, neg head CT.     Time Spent on Admission: 46 min      Melina Schools D.O.  [facility] 03/25/2020, 10:45 PM

## 2020-03-25 NOTE — ED Notes (Signed)
Pt's O2 sats in 70s with 6L O2.  Placed on non-rebreather 15L at this time, O2 sat at 97%.

## 2020-03-25 NOTE — ED Provider Notes (Signed)
[facility] Emergency Department Provider Note  ____________________________________________  Time seen: Approximately 8:10 PM  I have reviewed the triage vital signs and the nursing notes.   HISTORY  Chief Complaint Drug Overdose  Level 5 Caveat: Portions of the History and Physical including HPI and review of systems are unable to be completely obtained due to patient being a poor historian due to confusion   HPI Christian Fields is a 64 y.o. male with a history of anxiety, asthma, GERD, hypertension, hyperlipidemia, chronic respiratory failure on 2 to 3 L nasal cannula at home who is brought to the ED today due to medication overdose.  Patient denies SI or intent to harm himself, EMS reported the peers accidental, but they note that home seen was disorganized, patient had urinated on himself in the floor, medication bottles were strewn about.  Patient states he feels fine, denies chest pain shortness of breath headache vision changes falls or trauma.  No vomiting or diarrhea.      Past Medical History:  Diagnosis Date  . Anemia   . Anxiety   . Arthritis   . Asthma   . Cancer ([place])    Basal Cell Skin Cancer  . Chronic back pain   . COPD (chronic obstructive pulmonary disease) ([place])   . Depression   . Diabetes mellitus ([place])   . Dyspnea   . GERD (gastroesophageal reflux disease)   . Gout   . Gout   . Headache   . History of blood clots    Left Leg--July 2018  . History of kidney stones   . Hyperlipidemia   . Hyperlipidemia   . Hypertension   . Kidney stones   . Neuropathy   . On home oxygen therapy    2 L / M  . Pneumonia 06/2017  . Sleep apnea   . Ulcer of foot [facility])    Right     Patient Active Problem List   Diagnosis Date Noted  . Community acquired pneumonia 03/25/2020  . Arthritis 10/29/2019  . Hemorrhoids 10/29/2019  . Senile nuclear sclerosis, bilateral 02/15/2019  . Cellulitis of left upper limb 02/11/2019  . AAA  (abdominal aortic aneurysm) without rupture ([place]) 01/15/2019  . Localized swelling, mass and lump, upper limb 12/24/2018  . Pain in femur 12/24/2018  . TIA (transient ischemic attack) 11/18/2018  . Cortical age-related cataract of both eyes 11/02/2018  . Fracture of multiple ribs 04/20/2018  . Overweight (BMI 25.0-29.9) 04/20/2018  . Hypertension 07/08/2017  . Diabetes mellitus ([place]) 07/08/2017  . Hyperlipidemia 07/08/2017  . Tobacco use disorder 07/08/2017  . Atherosclerosis of native arteries of extremity with intermittent claudication ([place]) 07/08/2017  . SOB (shortness of breath) 06/19/2017  . Acute on chronic respiratory failure with hypoxia and hypercapnia ([place]) 12/15/2016  . CAP (community acquired pneumonia) 12/15/2016  . Chronic pain 12/15/2016  . Chronically on opiate therapy 12/15/2016  . History of kidney stones 12/15/2016  . Polypharmacy 12/15/2016  . Somnolence 12/15/2016  . Foot ulcer ([place]) 01/19/2016  . 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 Other psychoactive substance use, unspecified with withdrawal, uncomplicated ([place]) 35/32/9924  . Type 2 diabetes mellitus with foot ulcer (CODE) ([place]) 11/27/2015  . Chronic obstructive pulmonary disease, unspecified ([place]) 01/18/2013  . Depressive disorder 01/18/2013  . Hereditary and idiopathic peripheral neuropathy 01/18/2013  . Sleep apnea 01/18/2013  . Low back pain 11/23/2010  . Acute gouty arthropathy 07/10/2010  . Problem related to lifestyle 10/29/2004  Past Surgical History:  Procedure Laterality Date  . APPENDECTOMY    . DG FEET 2 VIEWS BILAT    . LIPOMA EXCISION Right 08/15/2017   Procedure: EXCISION TUMOR(CYST) FOOT;  Surgeon: Albertine Patricia, DPM;  Location: [facility] ORS;  Service: Podiatry;  Laterality: Right;  . OTHER SURGICAL HISTORY Bilateral Foot surgery     Prior to Admission medications   Medication Sig Start Date End Date Taking? Authorizing Provider  albuterol (PROVENTIL)  (2.5 MG/3ML) 0.083% nebulizer solution 2.5 mg. 12/16/16 01/15/19  [provider]  alendronate (FOSAMAX) 70 MG tablet Take 70 mg by mouth once a week.  06/23/17   [provider]  allopurinol (ZYLOPRIM) 100 MG tablet Take 1 tablet by mouth daily. 01/02/16   [provider]  aspirin EC 81 MG tablet Take 1 tablet by mouth daily.    [provider]  budesonide-formoterol (SYMBICORT) 160-4.5 MCG/ACT inhaler Inhale 2 puffs into the lungs 2 (two) times daily.    [provider]  buprenorphine-naloxone (SUBOXONE) 8-2 mg SUBL SL tablet Place 1 tablet under the tongue daily.    [provider]  cholecalciferol (VITAMIN D) 1000 units tablet Take 1,000 Units by mouth daily.    [provider]  clonazePAM (KLONOPIN) 1 MG tablet Take 1 mg by mouth 3 (three) times daily as needed.  12/14/15   [provider]  DULoxetine (CYMBALTA) 30 MG capsule Take 1 capsule by mouth daily. 01/17/16   [provider]  ferrous sulfate 325 (65 FE) MG tablet Take 325 mg by mouth daily with breakfast.    [provider]  fluticasone (FLONASE) 50 MCG/ACT nasal spray Place 2 sprays into both nostrils daily. 12/18/15   [provider]  gabapentin (NEURONTIN) 800 MG tablet Take 800 mg by mouth 4 (four) times daily. 12/18/15   [provider]  gemfibrozil (LOPID) 600 MG tablet Take 600 mg by mouth 2 (two) times daily before a meal.  06/05/17   [provider]  hydrochlorothiazide (HYDRODIURIL) 25 MG tablet Take 25 mg by mouth daily.  01/17/16   [provider]  HYDROcodone-acetaminophen (NORCO) 5-325 MG tablet Take 1 tablet by mouth every 6 (six) hours as needed for moderate pain. 08/15/17   Troxler, Rodman Key, DPM  ipratropium (ATROVENT) 0.02 % nebulizer solution Inhale 2.5 mL (500 mcg total) by nebulization every 6 (six) hours. 12/16/16 01/15/19  [provider]  KOMBIGLYZE XR 2.04-999 MG TB24 Take 1 tablet by mouth 2 (two)  times daily. 01/17/16   [provider]  pantoprazole (PROTONIX) 40 MG tablet Take 40 mg by mouth daily. 12/18/15   [provider]  PRADAXA 150 MG CAPS capsule Take 150 mg by mouth 2 (two) times daily.  06/23/17   [provider]  simvastatin (ZOCOR) 40 MG tablet Take 1 tablet by mouth daily at 6 PM. 01/17/16   [provider]  SPIRIVA HANDIHALER 18 MCG inhalation capsule Place 1 capsule into inhaler and inhale daily. 11/22/15   [provider]  varenicline (CHANTIX PAK) 0.5 MG X 11 & 1 MG X 42 tablet Take by mouth 2 (two) times daily. Take one 0.5 mg tablet by mouth once daily for 3 days, then increase to one 0.5 mg tablet twice daily for 4 days, then increase to one 1 mg tablet twice daily.    [provider]     Allergies Bee venom and Hydrocodone-acetaminophen   Family History  Problem Relation Age of Onset  . Hypertension Mother  Social History Social History   Tobacco Use  . Smoking status: Former Smoker    Packs/day: 0.50    Types: Cigarettes    Start date: 2018  . Smokeless tobacco: Never Used  Substance Use Topics  . Alcohol use: Yes    Alcohol/week: 1.0 standard drinks    Types: 1 Cans of beer per week    Comment: occ  . Drug use: No    Review of Systems  Constitutional:   No fever or chills.  ENT:   No sore throat. No rhinorrhea. Cardiovascular:   No chest pain or syncope. Respiratory:   No dyspnea or cough. Gastrointestinal:   Negative for abdominal pain, vomiting and diarrhea.  Musculoskeletal:   Negative for focal pain or swelling All other systems reviewed and are negative except as documented above in ROS and HPI.  ____________________________________________   PHYSICAL EXAM:  VITAL SIGNS: ED Triage Vitals  Enc Vitals Group     BP 03/25/20 2004 116/72     Pulse Rate 03/25/20 2004 84     Resp 03/25/20 2004 (!) 32     Temp 03/25/20 2004 99.7 F (37.6 C)     Temp Source 03/25/20 2004 Oral      SpO2 03/25/20 2004 96 %     Weight 03/25/20 2009 175 lb (79.4 kg)     Height 03/25/20 2009 5\' 8"  (1.727 m)     Head Circumference --      Peak Flow --      Pain Score 03/25/20 2008 0     Pain Loc --      Pain Edu? --      Excl. in [place]? --     Vital signs reviewed, nursing assessments reviewed.   Constitutional:   Alert and oriented to self. Non-toxic appearance. Eyes:   Conjunctivae are normal. EOMI. PERRL.  No nystagmus ENT      Head:   Normocephalic and atraumatic.      Nose: Normal.      Mouth/Throat:   Dry mucous membranes.  Dried pill residue caked around the mouth and tongue.      Neck:   No meningismus. Full ROM. Hematological/Lymphatic/Immunilogical:   No cervical lymphadenopathy. Cardiovascular:   RRR. Symmetric bilateral radial and DP pulses.  No murmurs. Cap refill less than 2 seconds. Respiratory:   Normal respiratory effort without tachypnea/retractions.  Left base crackles, no wheezing. Gastrointestinal:   Soft and nontender. Non distended. There is no CVA tenderness.  No rebound, rigidity, or guarding.  Musculoskeletal:   Normal range of motion in all extremities. No joint effusions.  No lower extremity tenderness.  No edema. Neurologic:   Normal speech and language. Cranial nerves III through XII intact Motor grossly intact. Not oriented Skin:    Skin is warm, dry and intact. No rash noted.  No petechiae, purpura, or bullae.  ____________________________________________    LABS (pertinent positives/negatives) (all labs ordered are listed, but only abnormal results are displayed) Labs Reviewed  ACETAMINOPHEN LEVEL - Abnormal; Notable for the following components:      Result Value   Acetaminophen (Tylenol), Serum <10 (*)    All other components within normal limits  COMPREHENSIVE METABOLIC PANEL - Abnormal; Notable for the following components:   Chloride 93 (*)    CO2 33 (*)    Glucose, Bld 125 (*)    Calcium 8.6 (*)    Albumin 3.1 (*)    All other  components within normal limits  SALICYLATE LEVEL - Abnormal;  Notable for the following components:   Salicylate Lvl <1.6 (*)    All other components within normal limits  CBC WITH DIFFERENTIAL/PLATELET - Abnormal; Notable for the following components:   WBC 16.6 (*)    RBC 3.39 (*)    Hemoglobin 10.1 (*)    HCT 30.9 (*)    RDW 16.2 (*)    Neutro Abs 14.4 (*)    Monocytes Absolute 1.1 (*)    Abs Immature Granulocytes 0.08 (*)    All other components within normal limits  ETHANOL  LIPASE, BLOOD  PROTIME-INR  PROCALCITONIN  URINALYSIS, COMPLETE (UACMP) WITH MICROSCOPIC  URINE DRUG SCREEN, QUALITATIVE ([facility] ONLY)  HEMOGLOBIN A1C  POC SARS CORONAVIRUS 2 AG -  ED   ____________________________________________   EKG    ____________________________________________    RADIOLOGY  CT HEAD WO CONTRAST  Result Date: 03/25/2020 CLINICAL DATA:  Altered mental status and weakness today. EXAM: CT HEAD WITHOUT CONTRAST TECHNIQUE: Contiguous axial images were obtained from the base of the skull through the vertex without intravenous contrast. COMPARISON:  01/19/2016 FINDINGS: Brain: Moderate diffuse enlargement of the ventricles and subarachnoid spaces with progression. Mild to moderate patchy white matter low density in both cerebral hemispheres with progression. 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 No intracranial hemorrhage, mass lesion or CT evidence of acute infarction. Vascular: No hyperdense vessel or unexpected calcification. Skull: Normal. Negative for fracture or focal lesion. Sinuses/Orbits: Status post bilateral cataract extraction. Unremarkable bones and included paranasal sinuses. Other: None. IMPRESSION: 1. No acute abnormality. 2. Moderate diffuse cerebral and cerebellar atrophy with progression. 3. Mild to moderate chronic small vessel white matter ischemic changes in both cerebral hemispheres with progression. 4. 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  Electronically Signed   By: Claudie Revering M.D.   On: 03/25/2020 21:03   DG Chest Portable 1 View  Result Date: 03/25/2020 CLINICAL DATA:  Shortness of breath. Confusion. Medication overdose. EXAM: PORTABLE CHEST 1 VIEW COMPARISON:  Radiograph 05/12/2017 FINDINGS: Normal heart size and mediastinal contours. Aortic atherosclerosis. Patchy bilateral lung opacities most prominent in the lower lung zones. No significant pleural fluid. No pneumothorax. Remote left rib fracture. IMPRESSION: Patchy bilateral lung opacities most prominent in the lower lung zones, may be atelectasis, aspiration, or pneumonia. Aortic Atherosclerosis (ICD10-I70.0). Electronically Signed   By: Keith Rake M.D.   On: 03/25/2020 20:37    ____________________________________________   PROCEDURES .Critical Care Performed by: Carrie Mew, MD Authorized by: Carrie Mew, MD   Critical care provider statement:    Critical care time (minutes):  35   Critical care time was exclusive of:  Separately billable procedures and treating other patients   Critical care was necessary to treat or prevent imminent or life-threatening deterioration of the following conditions:  Respiratory failure and CNS failure or compromise   Critical care was time spent personally by me on the following activities:  Development of treatment plan with patient or surrogate, discussions with consultants, evaluation of patient's response to treatment, examination of patient, obtaining history from patient or surrogate, ordering and performing treatments and interventions, ordering and review of laboratory studies, ordering and review of radiographic studies, pulse oximetry, re-evaluation of patient's condition and review of old charts    ____________________________________________  DIFFERENTIAL DIAGNOSIS   Stroke, intoxication, delirium secondary to UTI or pneumonia, electrolyte abnormality, dehydration  CLINICAL IMPRESSION / ASSESSMENT  AND PLAN / ED COURSE  Medications ordered in the ED: Medications  cefTRIAXone (ROCEPHIN) 2 g in sodium chloride 0.9 % 100  mL IVPB (2 g Intravenous New Bag/Given 03/25/20 2258)  azithromycin (ZITHROMAX) 500 mg in sodium chloride 0.9 % 250 mL IVPB (has no administration in time range)  insulin aspart (novoLOG) injection 0-9 Units (has no administration in time range)  sodium chloride 0.9 % bolus 1,000 mL (1,000 mLs Intravenous New Bag/Given 03/25/20 2144)    Pertinent labs & imaging results that were available during my care of the patient were reviewed by me and considered in my medical decision making (see chart for details).  NINA HOAR was evaluated in Emergency Department on 03/25/2020 for the symptoms described in the history of present illness. He was evaluated in the context of the global COVID-19 pandemic, which necessitated consideration that the patient might be at risk for infection with the SARS-CoV-2 virus that causes COVID-19. Institutional protocols and algorithms that pertain to the evaluation of patients at risk for COVID-19 are in a state of rapid change based on information released by regulatory bodies including the CDC and federal and state organizations. These policies and algorithms were followed during the patient's care in the ED.   Patient presents with confusion and suspected medication overdose from various pills.  Will check labs, chest x-ray, CT head.  IV fluids for hydration.  Multiple pills in his medication list could cause altered mental status if taken excessively including Klonopin, Cymbalta, gabapentin, buprenorphine, Chantix.  Clinical Course as of Mar 25 2317  Sat Mar 25, 2020  2117 Chest x-ray shows bilateral basilar opacities, concerning for pneumonia or possibly aspiration, especially considering patient's confusion.  Will start antibiotics, check urinalysis, plan to hospitalize. He is not septic.   [PS]  2228 Pt noted to be hypoxic on 4L [place] by nurse.  Placed on NRB for now, repositioned in bed.    [PS]    Clinical Course User Index [PS] Carrie Mew, MD     ____________________________________________   FINAL CLINICAL IMPRESSION(S) / ED DIAGNOSES    Final diagnoses:  Pneumonia of both lower lobes due to infectious organism  Acute on chronic respiratory failure with hypoxia [facility])  Confusion     ED Discharge Orders    None      Portions of this note were generated with dragon dictation software. Dictation errors may occur despite best attempts at proofreading.   Carrie Mew, MD 03/25/20 2318

## 2020-03-26 ENCOUNTER — Inpatient Hospital Stay: Payer: Medicare HMO

## 2020-03-26 ENCOUNTER — Other Ambulatory Visit: Payer: Self-pay

## 2020-03-26 ENCOUNTER — Encounter: Payer: Self-pay | Admitting: Internal Medicine

## 2020-03-26 DIAGNOSIS — E1151 Type 2 diabetes mellitus with diabetic peripheral angiopathy without gangrene: Secondary | ICD-10-CM

## 2020-03-26 DIAGNOSIS — J9601 Acute respiratory failure with hypoxia: Secondary | ICD-10-CM | POA: Diagnosis not present

## 2020-03-26 DIAGNOSIS — K219 Gastro-esophageal reflux disease without esophagitis: Secondary | ICD-10-CM | POA: Diagnosis present

## 2020-03-26 DIAGNOSIS — M1990 Unspecified osteoarthritis, unspecified site: Secondary | ICD-10-CM | POA: Diagnosis present

## 2020-03-26 DIAGNOSIS — J690 Pneumonitis due to inhalation of food and vomit: Secondary | ICD-10-CM | POA: Diagnosis present

## 2020-03-26 DIAGNOSIS — J9622 Acute and chronic respiratory failure with hypercapnia: Secondary | ICD-10-CM | POA: Diagnosis present

## 2020-03-26 DIAGNOSIS — M549 Dorsalgia, unspecified: Secondary | ICD-10-CM | POA: Diagnosis present

## 2020-03-26 DIAGNOSIS — I714 Abdominal aortic aneurysm, without rupture: Secondary | ICD-10-CM | POA: Diagnosis present

## 2020-03-26 DIAGNOSIS — G9341 Metabolic encephalopathy: Secondary | ICD-10-CM | POA: Diagnosis present

## 2020-03-26 DIAGNOSIS — E11621 Type 2 diabetes mellitus with foot ulcer: Secondary | ICD-10-CM | POA: Diagnosis present

## 2020-03-26 DIAGNOSIS — J449 Chronic obstructive pulmonary disease, unspecified: Secondary | ICD-10-CM | POA: Diagnosis present

## 2020-03-26 DIAGNOSIS — R410 Disorientation, unspecified: Secondary | ICD-10-CM | POA: Diagnosis present

## 2020-03-26 DIAGNOSIS — Z9981 Dependence on supplemental oxygen: Secondary | ICD-10-CM | POA: Diagnosis not present

## 2020-03-26 DIAGNOSIS — Z85828 Personal history of other malignant neoplasm of skin: Secondary | ICD-10-CM | POA: Diagnosis not present

## 2020-03-26 DIAGNOSIS — I10 Essential (primary) hypertension: Secondary | ICD-10-CM | POA: Diagnosis present

## 2020-03-26 DIAGNOSIS — Z7982 Long term (current) use of aspirin: Secondary | ICD-10-CM | POA: Diagnosis not present

## 2020-03-26 DIAGNOSIS — J9621 Acute and chronic respiratory failure with hypoxia: Secondary | ICD-10-CM | POA: Diagnosis present

## 2020-03-26 DIAGNOSIS — J9600 Acute respiratory failure, unspecified whether with hypoxia or hypercapnia: Secondary | ICD-10-CM | POA: Diagnosis present

## 2020-03-26 DIAGNOSIS — Z20822 Contact with and (suspected) exposure to covid-19: Secondary | ICD-10-CM | POA: Diagnosis present

## 2020-03-26 DIAGNOSIS — J189 Pneumonia, unspecified organism: Secondary | ICD-10-CM | POA: Diagnosis not present

## 2020-03-26 DIAGNOSIS — G629 Polyneuropathy, unspecified: Secondary | ICD-10-CM | POA: Diagnosis present

## 2020-03-26 DIAGNOSIS — G4730 Sleep apnea, unspecified: Secondary | ICD-10-CM | POA: Diagnosis present

## 2020-03-26 DIAGNOSIS — Z8673 Personal history of transient ischemic attack (TIA), and cerebral infarction without residual deficits: Secondary | ICD-10-CM | POA: Diagnosis not present

## 2020-03-26 DIAGNOSIS — G8929 Other chronic pain: Secondary | ICD-10-CM | POA: Diagnosis present

## 2020-03-26 DIAGNOSIS — M109 Gout, unspecified: Secondary | ICD-10-CM | POA: Diagnosis present

## 2020-03-26 DIAGNOSIS — E785 Hyperlipidemia, unspecified: Secondary | ICD-10-CM | POA: Diagnosis present

## 2020-03-26 DIAGNOSIS — I70219 Atherosclerosis of native arteries of extremities with intermittent claudication, unspecified extremity: Secondary | ICD-10-CM | POA: Diagnosis present

## 2020-03-26 DIAGNOSIS — I70212 Atherosclerosis of native arteries of extremities with intermittent claudication, left leg: Secondary | ICD-10-CM | POA: Diagnosis not present

## 2020-03-26 DIAGNOSIS — T50901A Poisoning by unspecified drugs, medicaments and biological substances, accidental (unintentional), initial encounter: Secondary | ICD-10-CM | POA: Diagnosis present

## 2020-03-26 LAB — CBC
HCT: 28.1 % — ABNORMAL LOW (ref 39.0–52.0)
Hemoglobin: 9 g/dL — ABNORMAL LOW (ref 13.0–17.0)
MCH: 29.3 pg (ref 26.0–34.0)
MCHC: 32 g/dL (ref 30.0–36.0)
MCV: 91.5 fL (ref 80.0–100.0)
Platelets: 286 10*3/uL (ref 150–400)
RBC: 3.07 MIL/uL — ABNORMAL LOW (ref 4.22–5.81)
RDW: 16 % — ABNORMAL HIGH (ref 11.5–15.5)
WBC: 16 10*3/uL — ABNORMAL HIGH (ref 4.0–10.5)
nRBC: 0 % (ref 0.0–0.2)

## 2020-03-26 LAB — URINE DRUG SCREEN, QUALITATIVE (ARMC ONLY)
Amphetamines, Ur Screen: NOT DETECTED
Barbiturates, Ur Screen: NOT DETECTED
Benzodiazepine, Ur Scrn: POSITIVE — AB
Cannabinoid 50 Ng, Ur ~~LOC~~: NOT DETECTED
Cocaine Metabolite,Ur ~~LOC~~: NOT DETECTED
MDMA (Ecstasy)Ur Screen: NOT DETECTED
Methadone Scn, Ur: NOT DETECTED
Opiate, Ur Screen: NOT DETECTED
Phencyclidine (PCP) Ur S: NOT DETECTED
Tricyclic, Ur Screen: NOT DETECTED

## 2020-03-26 LAB — GLUCOSE, CAPILLARY
Glucose-Capillary: 85 mg/dL (ref 70–99)
Glucose-Capillary: 96 mg/dL (ref 70–99)
Glucose-Capillary: 97 mg/dL (ref 70–99)
Glucose-Capillary: 156 mg/dL — ABNORMAL HIGH (ref 70–99)

## 2020-03-26 LAB — BASIC METABOLIC PANEL
Anion gap: 10 (ref 5–15)
BUN: 14 mg/dL (ref 8–23)
CO2: 32 mmol/L (ref 22–32)
Calcium: 7.9 mg/dL — ABNORMAL LOW (ref 8.9–10.3)
Chloride: 97 mmol/L — ABNORMAL LOW (ref 98–111)
Creatinine, Ser: 1.1 mg/dL (ref 0.61–1.24)
GFR calc Af Amer: >60 mL/min (ref >60)
GFR calc non Af Amer: >60 mL/min (ref >60)
Glucose, Bld: 94 mg/dL (ref 70–99)
Potassium: 3.6 mmol/L (ref 3.5–5.1)
Sodium: 139 mmol/L (ref 135–145)

## 2020-03-26 LAB — HEMOGLOBIN A1C
Hgb A1c MFr Bld: 5.4 % (ref 4.8–5.6)
Mean Plasma Glucose: 108.28 mg/dL

## 2020-03-26 LAB — STREP PNEUMONIAE URINARY ANTIGEN: Strep Pneumo Urinary Antigen: NEGATIVE

## 2020-03-26 LAB — HIV ANTIBODY (ROUTINE TESTING W REFLEX): HIV Screen 4th Generation wRfx: NONREACTIVE

## 2020-03-26 MED ORDER — HYDROCHLOROTHIAZIDE 25 MG PO TABS
25.0000 mg | ORAL_TABLET | Freq: Every day | ORAL | Status: DC
  Administered 2020-03-26: 25 mg via ORAL
  Filled 2020-03-26: qty 1

## 2020-03-26 MED ORDER — BUPRENORPHINE HCL-NALOXONE HCL 8-2 MG SL SUBL
1.0000 | SUBLINGUAL_TABLET | Freq: Every day | SUBLINGUAL | Status: DC
  Administered 2020-03-26 – 2020-03-28 (×3): 1 via SUBLINGUAL
  Filled 2020-03-26: qty 1
  Filled 2020-03-26: qty 2
  Filled 2020-03-26: qty 1

## 2020-03-26 MED ORDER — SODIUM CHLORIDE 0.9 % IV SOLN
INTRAVENOUS | Status: DC | PRN
  Administered 2020-03-26: 250 mL via INTRAVENOUS

## 2020-03-26 MED ORDER — DABIGATRAN ETEXILATE MESYLATE 150 MG PO CAPS
150.0000 mg | ORAL_CAPSULE | Freq: Two times a day (BID) | ORAL | Status: DC
  Administered 2020-03-26 – 2020-03-28 (×5): 150 mg via ORAL
  Filled 2020-03-26 (×7): qty 1

## 2020-03-26 MED ORDER — TIOTROPIUM BROMIDE MONOHYDRATE 18 MCG IN CAPS
1.0000 | ORAL_CAPSULE | Freq: Every day | RESPIRATORY_TRACT | Status: DC
  Administered 2020-03-26 – 2020-03-28 (×2): 18 ug via RESPIRATORY_TRACT
  Filled 2020-03-26: qty 5

## 2020-03-26 MED ORDER — GEMFIBROZIL 600 MG PO TABS
600.0000 mg | ORAL_TABLET | Freq: Two times a day (BID) | ORAL | Status: DC
  Administered 2020-03-26 – 2020-03-28 (×5): 600 mg via ORAL
  Filled 2020-03-26 (×8): qty 1

## 2020-03-26 MED ORDER — SIMVASTATIN 40 MG PO TABS
40.0000 mg | ORAL_TABLET | Freq: Every day | ORAL | Status: DC
  Administered 2020-03-26: 40 mg via ORAL
  Filled 2020-03-26: qty 1

## 2020-03-26 MED ORDER — ENOXAPARIN SODIUM 40 MG/0.4ML ~~LOC~~ SOLN: 40.0000 mg | SUBCUTANEOUS | Status: DC

## 2020-03-26 MED ORDER — IOHEXOL 350 MG/ML SOLN
75.0000 mL | Freq: Once | INTRAVENOUS | Status: AC | PRN
  Administered 2020-03-26: 75 mL via INTRAVENOUS

## 2020-03-26 MED ORDER — MOMETASONE FURO-FORMOTEROL FUM 200-5 MCG/ACT IN AERO
2.0000 | INHALATION_SPRAY | Freq: Two times a day (BID) | RESPIRATORY_TRACT | Status: DC
  Administered 2020-03-26 – 2020-03-28 (×5): 2 via RESPIRATORY_TRACT
  Filled 2020-03-26: qty 8.8

## 2020-03-26 MED ORDER — PANTOPRAZOLE SODIUM 40 MG PO TBEC
40.0000 mg | DELAYED_RELEASE_TABLET | Freq: Every day | ORAL | Status: DC
  Administered 2020-03-26 – 2020-03-28 (×3): 40 mg via ORAL
  Filled 2020-03-26 (×3): qty 1

## 2020-03-26 NOTE — Progress Notes (Signed)
Patient became agitated, cobative, attempted to hit staff, family notified, asked to come sit with patient, family stated I am being told different "shit", refused to come, family did talk pt into taking meds.

## 2020-03-26 NOTE — ED Notes (Signed)
Waiting for meds to be verified by pharmacy.

## 2020-03-26 NOTE — Progress Notes (Addendum)
[place] at [place] NAME: Christian Fields    MR#:  [id number]  DATE OF BIRTH:  Nov 21, 1956  SUBJECTIVE:  CHIEF COMPLAINT:   Chief Complaint  Patient presents with  . Drug Overdose  Sleepy.  Wakes up to loud verbal commands but falls back to sleep easily.  Requiring 7 L oxygen via nasal cannula. REVIEW OF SYSTEMS:  Review of Systems  Unable to perform ROS: Mental acuity   DRUG ALLERGIES:   Allergies  Allergen Reactions  . Bee Venom Anaphylaxis  . Hydrocodone-Acetaminophen Itching   VITALS:  Blood pressure 106/66, pulse 81, temperature 98.4 F (36.9 C), temperature source Oral, resp. rate 18, height 5\' 8"  (1.727 m), weight 79.4 kg, SpO2 92 %. PHYSICAL EXAMINATION:  Physical Exam HENT:     Head: Normocephalic and atraumatic.  Eyes:     Conjunctiva/sclera: Conjunctivae normal.     Pupils: Pupils are equal, round, and reactive to light.  Neck:     Thyroid: No thyromegaly.     Trachea: No tracheal deviation.  Cardiovascular:     Rate and Rhythm: Normal rate and regular rhythm.     Heart sounds: Normal heart sounds.  Pulmonary:     Effort: Pulmonary effort is normal. No respiratory distress.     Breath sounds: Normal breath sounds. No wheezing.  Chest:     Chest wall: No tenderness.  Abdominal:     General: Bowel sounds are normal. There is no distension.     Palpations: Abdomen is soft.     Tenderness: There is no abdominal tenderness.  Musculoskeletal:        General: Normal range of motion.     Cervical back: Normal range of motion and neck supple.  Skin:    General: Skin is warm and dry.     Findings: No rash.  Neurological:     Mental Status: He is lethargic.     Cranial Nerves: No cranial nerve deficit.    LABORATORY PANEL:  Male CBC Recent Labs  Lab 03/26/20 0341  WBC 16.0*  HGB 9.0*  HCT 28.1*  PLT 286    ------------------------------------------------------------------------------------------------------------------ Chemistries  Recent Labs  Lab 03/25/20 2023 03/25/20 2023 03/26/20 0341  NA 138   < > 139  K 3.7   < > 3.6  CL 93*   < > 97*  CO2 33*   < > 32  GLUCOSE 125*   < > 94  BUN 12   < > 14  CREATININE 1.14   < > 1.10  CALCIUM 8.6*   < > 7.9*  AST 15  --   --   ALT 11  --   --   ALKPHOS 104  --   --   BILITOT 0.7  --   --    < > = values in this interval not displayed.   RADIOLOGY:  CT HEAD WO CONTRAST  Result Date: 03/25/2020 CLINICAL DATA:  Altered mental status and weakness today. EXAM: CT HEAD WITHOUT CONTRAST TECHNIQUE: Contiguous axial images were obtained from the base of the skull through the vertex without intravenous contrast. COMPARISON:  01/19/2016 FINDINGS: Brain: Moderate diffuse enlargement of the ventricles and subarachnoid spaces with progression. Mild to moderate patchy white matter low density in both cerebral hemispheres with progression. 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 No intracranial hemorrhage, mass lesion or CT evidence of acute infarction. Vascular: No hyperdense vessel or unexpected calcification. Skull: Normal. Negative  for fracture or focal lesion. Sinuses/Orbits: Status post bilateral cataract extraction. Unremarkable bones and included paranasal sinuses. Other: None. IMPRESSION: 1. No acute abnormality. 2. Moderate diffuse cerebral and cerebellar atrophy with progression. 3. Mild to moderate chronic small vessel white matter ischemic changes in both cerebral hemispheres with progression. 4. 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 Electronically Signed   By: Claudie Revering M.D.   On: 03/25/2020 21:03   DG Chest Portable 1 View  Result Date: 03/25/2020 CLINICAL DATA:  Shortness of breath. Confusion. Medication overdose. EXAM: PORTABLE CHEST 1 VIEW COMPARISON:  Radiograph 05/12/2017 FINDINGS: Normal heart size and mediastinal  contours. Aortic atherosclerosis. Patchy bilateral lung opacities most prominent in the lower lung zones. No significant pleural fluid. No pneumothorax. Remote left rib fracture. IMPRESSION: Patchy bilateral lung opacities most prominent in the lower lung zones, may be atelectasis, aspiration, or pneumonia. Aortic Atherosclerosis (ICD10-I70.0). Electronically Signed   By: Keith Rake M.D.   On: 03/25/2020 20:37   ASSESSMENT AND PLAN:   Acute on Chronic hypoxic respiratory Failure/CAP/aspiration pneumonia Patient hypoxic on his home O2. Stable with non-rebreather. CXR with patchy bilateral lung opacities most prominent in lower lung zones that may be atelectasis, aspiration, or pneumonia. Patient is afebrile but does have leukocytosis to 16. Baseline PCT is 0.21.  -continue supplemental O2 -currently requiring 7 L oxygen, wean to home dose as tolerated  -continue CTX and Azithromycin  -COVID negative and RVP pending  -Check respiratory virus panel -trend PCT -We will obtain CT chest for further evaluation  Altered Mental Status/Confusion  Patient presented with concern for medication overdose. Multiple pills in his medication list could cause altered mental status if taken excessively including Klonopin, Norco, Chantix, Cymbalta, Gabapentin, Suboxone.  Salicylate, ethanol, acetaminophen levels within normal limits. CT head showed no acute abnormalities. Infection could be potential source as well and this may be multi factorial.  -UDS pending  -hold above potential offending medications with the exception of Suboxone to avoid withdrawal symptoms  -neuro checks  -treat CAP as outlined above  -Regular diet if awake enough to eat  COPD  Does not have any symptoms or exam findings concerning for acute exacerbation.  -Dulera and Spiriva   T2DM  Last A1c <6.0.  -hold Kombiglyze  sensitive SSI  -CBGs AC/HS  HTN   BP Normotensive.  -continue HCTZ 25 mg    Status is:  Inpatient  Remains inpatient appropriate because:Inpatient level of care appropriate due to severity of illness   Dispo: The patient is from: Home              Anticipated d/c is to: Home              Anticipated d/c date is: 2 days              Patient currently is not medically stable to d/c.    DVT prophylaxis: Lovenox Family Communication: Updated patient's daughter Drue Dun @ [phone] (discussed CT findings)   All the records are reviewed and case discussed with Care Management/Social Worker. Management plans discussed with the patient, nursing and they are in agreement.  CODE STATUS: Full Code  TOTAL TIME TAKING CARE OF THIS PATIENT: 35 minutes.   More than 50% of the time was spent in counseling/coordination of care: YES  POSSIBLE D/C IN 2-3 DAYS, DEPENDING ON CLINICAL CONDITION.   Max Sane M.D on 03/26/2020 at 1:49 PM  [facility]   CC: Primary care physician; Remi Haggard, FNP  Note: This  dictation was prepared with Dragon dictation along with smaller phrase technology. Any transcriptional errors that result from this process are unintentional.

## 2020-03-26 NOTE — Plan of Care (Signed)
Continuing with plan of care. 

## 2020-03-27 DIAGNOSIS — J9601 Acute respiratory failure with hypoxia: Secondary | ICD-10-CM

## 2020-03-27 LAB — CBC
HCT: 28.8 % — ABNORMAL LOW (ref 39.0–52.0)
Hemoglobin: 9.1 g/dL — ABNORMAL LOW (ref 13.0–17.0)
MCH: 29.4 pg (ref 26.0–34.0)
MCHC: 31.6 g/dL (ref 30.0–36.0)
MCV: 92.9 fL (ref 80.0–100.0)
Platelets: 327 10*3/uL (ref 150–400)
RBC: 3.1 MIL/uL — ABNORMAL LOW (ref 4.22–5.81)
RDW: 16 % — ABNORMAL HIGH (ref 11.5–15.5)
WBC: 13.6 10*3/uL — ABNORMAL HIGH (ref 4.0–10.5)
nRBC: 0 % (ref 0.0–0.2)

## 2020-03-27 LAB — BASIC METABOLIC PANEL
Anion gap: 10 (ref 5–15)
BUN: 16 mg/dL (ref 8–23)
CO2: 32 mmol/L (ref 22–32)
Calcium: 8 mg/dL — ABNORMAL LOW (ref 8.9–10.3)
Chloride: 97 mmol/L — ABNORMAL LOW (ref 98–111)
Creatinine, Ser: 1.06 mg/dL (ref 0.61–1.24)
GFR calc Af Amer: >60 mL/min (ref >60)
GFR calc non Af Amer: >60 mL/min (ref >60)
Glucose, Bld: 123 mg/dL — ABNORMAL HIGH (ref 70–99)
Potassium: 3.9 mmol/L (ref 3.5–5.1)
Sodium: 139 mmol/L (ref 135–145)

## 2020-03-27 LAB — GLUCOSE, CAPILLARY
Glucose-Capillary: 111 mg/dL — ABNORMAL HIGH (ref 70–99)
Glucose-Capillary: 136 mg/dL — ABNORMAL HIGH (ref 70–99)
Glucose-Capillary: 189 mg/dL — ABNORMAL HIGH (ref 70–99)
Glucose-Capillary: 125 mg/dL — ABNORMAL HIGH (ref 70–99)

## 2020-03-27 LAB — PROCALCITONIN: Procalcitonin: 0.33 ng/mL

## 2020-03-27 MED ORDER — ALBUMIN HUMAN 25 % IV SOLN
12.5000 g | Freq: Every day | INTRAVENOUS | Status: DC
  Administered 2020-03-27 – 2020-03-28 (×2): 12.5 g via INTRAVENOUS
  Filled 2020-03-27 (×2): qty 50

## 2020-03-27 MED ORDER — ATORVASTATIN CALCIUM 20 MG PO TABS
20.0000 mg | ORAL_TABLET | Freq: Every day | ORAL | Status: DC
  Administered 2020-03-27: 20 mg via ORAL
  Filled 2020-03-27: qty 1

## 2020-03-27 MED ORDER — TRAMADOL-ACETAMINOPHEN 37.5-325 MG PO TABS
1.0000 | ORAL_TABLET | Freq: Once | ORAL | Status: AC
  Administered 2020-03-27: 1 via ORAL

## 2020-03-27 MED ORDER — GABAPENTIN 400 MG PO CAPS
800.0000 mg | ORAL_CAPSULE | Freq: Four times a day (QID) | ORAL | Status: DC
  Administered 2020-03-27 – 2020-03-28 (×4): 800 mg via ORAL
  Filled 2020-03-27 (×4): qty 2

## 2020-03-27 MED ORDER — FUROSEMIDE 10 MG/ML IJ SOLN
40.0000 mg | Freq: Every day | INTRAMUSCULAR | Status: DC
  Administered 2020-03-27 – 2020-03-28 (×2): 40 mg via INTRAVENOUS
  Filled 2020-03-27 (×2): qty 4

## 2020-03-27 MED ORDER — SODIUM CHLORIDE 0.9 % IV SOLN
25.0000 mg | Freq: Four times a day (QID) | INTRAVENOUS | Status: DC | PRN
  Filled 2020-03-27: qty 0.5

## 2020-03-27 NOTE — Consult Note (Signed)
Pulmonary Medicine          Date: 03/27/2020,   MRN# [id number] Christian Fields 26-Mar-1956     AdmissionWeight: 79.4 kg                 CurrentWeight: 79.4 kg   Refering physician: Dr Manuella Ghazi    CHIEF COMPLAINT:   Acute hypoxemic respiratory failure   HISTORY OF PRESENT ILLNESS   THORSTEN Fields is 64 y.o. male with h/o HTN, HLD, COPD with chronic respiratory failure on 2 to 3L [place] O2, anxiety, chronic opiate use who is brought by EMS to ED for concern of medication overdose. Patient reports he doesn't think he took medications incorrectly and had no intent to harm himself. EMS reported that appeared accidental, but they note that home seen was disorganized, patient had urinated on himself in the floor, medication bottles were strewn about.  Patient reports he is feeling well and has no complaints. Denies chest pain, SOB, cough, vomiting, headaches, vision changes, diarrhea.  Pulmonary consultation placed due to persistent hypoxemia and respiratory compromise. I reviewed CT chest with patient and son at bedside today.    PAST MEDICAL HISTORY   Past Medical History:  Diagnosis Date  . Anemia   . Anxiety   . Arthritis   . Asthma   . Cancer ([place])    Basal Cell Skin Cancer  . Chronic back pain   . COPD (chronic obstructive pulmonary disease) ([place])   . Depression   . Diabetes mellitus ([place])   . Dyspnea   . GERD (gastroesophageal reflux disease)   . Gout   . Gout   . Headache   . History of blood clots    Left Leg--July 2018  . History of kidney stones   . Hyperlipidemia   . Hyperlipidemia   . Hypertension   . Kidney stones   . Neuropathy   . On home oxygen therapy    2 L / M  . Pneumonia 06/2017  . Sleep apnea   . Ulcer of foot ([place])    Right     SURGICAL HISTORY   Past Surgical History:  Procedure Laterality Date  . APPENDECTOMY    . DG FEET 2 VIEWS BILAT    . LIPOMA EXCISION Right 08/15/2017   Procedure: EXCISION TUMOR(CYST) FOOT;  Surgeon:  Albertine Patricia, DPM;  Location: [facility] ORS;  Service: Podiatry;  Laterality: Right;  . OTHER SURGICAL HISTORY Bilateral Foot surgery     FAMILY HISTORY   Family History  Problem Relation Age of Onset  . Hypertension Mother      SOCIAL HISTORY   Social History   Tobacco Use  . Smoking status: Former Smoker    Packs/day: 0.50    Types: Cigarettes    Start date: 2018  . Smokeless tobacco: Never Used  Substance Use Topics  . Alcohol use: Yes    Alcohol/week: 1.0 standard drinks    Types: 1 Cans of beer per week    Comment: occ  . Drug use: No     MEDICATIONS    Home Medication:    Current Medication:  Current Facility-Administered Medications:  .  0.9 %  sodium chloride infusion, , Intravenous, PRN, Max Sane, MD, Stopped at 03/27/20 0120 .  atorvastatin (LIPITOR) tablet 20 mg, 20 mg, Oral, q1800, Dallie Piles, RPH .  azithromycin (ZITHROMAX) 500 mg in sodium chloride 0.9 % 250 mL IVPB, 500 mg, Intravenous, Q24H, Nicolette Bang, DO, Stopped at  03/27/20 0016 .  buprenorphine-naloxone (SUBOXONE) 8-2 mg per SL tablet 1 tablet, 1 tablet, Sublingual, Daily, Nicolette Bang, DO, 1 tablet at 03/27/20 1001 .  cefTRIAXone (ROCEPHIN) 2 g in sodium chloride 0.9 % 100 mL IVPB, 2 g, Intravenous, Q24H, Nicolette Bang, DO, Stopped at 03/26/20 2259 .  dabigatran (PRADAXA) capsule 150 mg, 150 mg, Oral, BID, Nicolette Bang, DO, 150 mg at 03/27/20 1002 .  gemfibrozil (LOPID) tablet 600 mg, 600 mg, Oral, BID AC, Nicolette Bang, DO, 600 mg at 03/27/20 1002 .  insulin aspart (novoLOG) injection 0-9 Units, 0-9 Units, Subcutaneous, TID WC, Nicolette Bang, DO .  mometasone-formoterol [facility]) 200-5 MCG/ACT inhaler 2 puff, 2 puff, Inhalation, BID, Nicolette Bang, DO, 2 puff at 03/27/20 1002 .  pantoprazole (PROTONIX) EC tablet 40 mg, 40 mg, Oral, Daily, Nicolette Bang, DO, 40 mg at 03/27/20 1001 .   tiotropium (SPIRIVA) inhalation capsule ([facility] use ONLY) 18 mcg, 1 capsule, Inhalation, Daily, Nicolette Bang, DO, 18 mcg at 03/26/20 1210    ALLERGIES   Bee venom and Hydrocodone-acetaminophen     REVIEW OF SYSTEMS    Review of Systems:  Gen:  Denies  fever, sweats, chills weigh loss  HEENT: Denies blurred vision, double vision, ear pain, eye pain, hearing loss, nose bleeds, sore throat Cardiac:  No dizziness, chest pain or heaviness, chest tightness,edema Resp:   Denies cough or sputum porduction, shortness of breath,wheezing, hemoptysis,  Gi: Denies swallowing difficulty, stomach pain, nausea or vomiting, diarrhea, constipation, bowel incontinence Gu:  Denies bladder incontinence, burning urine Ext:   Denies Joint pain, stiffness or swelling Skin: Denies  skin rash, easy bruising or bleeding or hives Endoc:  Denies polyuria, polydipsia , polyphagia or weight change Psych:   Denies depression, insomnia or hallucinations   Other:  All other systems negative   VS: BP 104/63   Pulse 80   Temp 97.6 F (36.4 C) (Oral)   Resp 20   Ht 5\' 8"  (1.727 m)   Wt 79.4 kg   SpO2 99%   BMI 26.61 kg/m      PHYSICAL EXAM    GENERAL:NAD, no fevers, chills, no weakness no fatigue HEAD: Normocephalic, atraumatic.  EYES: Pupils equal, round, reactive to light. Extraocular muscles intact. No scleral icterus.  MOUTH: Moist mucosal membrane. Dentition intact. No abscess noted.  EAR, NOSE, THROAT: Clear without exudates. No external lesions.  NECK: Supple. No thyromegaly. No nodules. No JVD.  PULMONARY: rhonchorous breath sounda on left lung CARDIOVASCULAR: S1 and S2. Regular rate and rhythm. No murmurs, rubs, or gallops. No edema. Pedal pulses 2+ bilaterally.  GASTROINTESTINAL: Soft, nontender, nondistended. No masses. Positive bowel sounds. No hepatosplenomegaly.  MUSCULOSKELETAL: No swelling, clubbing, or edema. Range of motion full in all extremities.  NEUROLOGIC: Cranial  nerves II through XII are intact. No gross focal neurological deficits. Sensation intact. Reflexes intact.  SKIN: No ulceration, lesions, rashes, or cyanosis. Skin warm and dry. Turgor intact.  PSYCHIATRIC: Mood, affect within normal limits. The patient is awake, alert and oriented x 3. Insight, judgment intact.       IMAGING    CT HEAD WO CONTRAST  Result Date: 03/25/2020 CLINICAL DATA:  Altered mental status and weakness today. EXAM: CT HEAD WITHOUT CONTRAST TECHNIQUE: Contiguous axial images were obtained from the base of the skull through the vertex without intravenous contrast. COMPARISON:  01/19/2016 FINDINGS: Brain: Moderate diffuse enlargement of the ventricles and subarachnoid spaces with progression. Mild to moderate patchy white matter  low density in Fields cerebral hemispheres with progression. 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 No intracranial hemorrhage, mass lesion or CT evidence of acute infarction. Vascular: No hyperdense vessel or unexpected calcification. Skull: Normal. Negative for fracture or focal lesion. Sinuses/Orbits: Status post bilateral cataract extraction. Unremarkable bones and included paranasal sinuses. Other: None. IMPRESSION: 1. No acute abnormality. 2. Moderate diffuse cerebral and cerebellar atrophy with progression. 3. Mild to moderate chronic small vessel white matter ischemic changes in Fields cerebral hemispheres with progression. 4. 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 Electronically Signed   By: Claudie Revering M.D.   On: 03/25/2020 21:03   CT ANGIO CHEST PE W OR WO CONTRAST  Result Date: 03/26/2020 CLINICAL DATA:  Shortness of breath EXAM: CT ANGIOGRAPHY CHEST WITH CONTRAST TECHNIQUE: Multidetector CT imaging of the chest was performed using the standard protocol during bolus administration of intravenous contrast. Multiplanar CT image reconstructions and MIPs were obtained to evaluate the vascular anatomy. CONTRAST:  46mL OMNIPAQUE IOHEXOL 350  MG/ML SOLN COMPARISON:  None. FINDINGS: Cardiovascular: Some of the most peripheral segmental and subsegmental pulmonary artery branches are difficult to definitively characterize due to patient breathing motion artifact, however, there is no pulmonary embolism identified within the main, lobar or central segmental pulmonary arteries bilaterally. No thoracic aortic aneurysm or evidence of aortic dissection. Aortic atherosclerosis. No pericardial effusion. Mediastinum/Nodes: Moderately enlarged lymph nodes within the bilateral perihilar regions. Scattered small lymph nodes within the mediastinum. Esophagus is unremarkable. Trachea appears normal. Lungs/Pleura: Dense consolidation occupying the majority of the LEFT lower lobe. Additional smaller/patchy consolidations within the RIGHT lower lobe, lingula and RIGHT middle lobe. Small LEFT pleural effusion. Irregular pulmonary nodule within the inferolateral aspect of the LEFT upper lobe, measuring 1 cm greatest dimension (series 6, image 29). Upper Abdomen: Limited images of the upper abdomen are unremarkable. Musculoskeletal: Multiple old healed rib fractures on the LEFT. No acute appearing osseous abnormality. Review of the MIP images confirms the above findings. IMPRESSION: 1. Dense consolidations at the bilateral lung bases, LEFT greater than RIGHT, compatible with multifocal pneumonia and/or aspiration. Small LEFT pleural effusion. 2. Moderately enlarged lymph nodes within the bilateral perihilar regions, most likely reactive. 3. **An incidental finding of potential clinical significance has been found. Irregular pulmonary nodule within the inferolateral aspect of the LEFT upper lobe, measuring 1 cm greatest dimension, suspicious for a neoplastic nodule, alternatively an additional focus of pneumonia. Recommend short-term follow-up chest CT in 3 months to ensure stability or resolution.** 4. Small LEFT pleural effusion. 5. No pulmonary embolism seen, with mild  study limitations detailed above. Aortic Atherosclerosis (ICD10-I70.0). Electronically Signed   By: Franki Cabot M.D.   On: 03/26/2020 14:47   DG Chest Portable 1 View  Result Date: 03/25/2020 CLINICAL DATA:  Shortness of breath. Confusion. Medication overdose. EXAM: PORTABLE CHEST 1 VIEW COMPARISON:  Radiograph 05/12/2017 FINDINGS: Normal heart size and mediastinal contours. Aortic atherosclerosis. Patchy bilateral lung opacities most prominent in the lower lung zones. No significant pleural fluid. No pneumothorax. Remote left rib fracture. IMPRESSION: Patchy bilateral lung opacities most prominent in the lower lung zones, may be atelectasis, aspiration, or pneumonia. Aortic Atherosclerosis (ICD10-I70.0). Electronically Signed   By: Keith Rake M.D.   On: 03/25/2020 20:37      ASSESSMENT/PLAN   Acute hypoxemic respiratory failure  - due to left lung consolidated pneumonia   - noted procal and MRSA nasal PCR negative   -agree with Rocephin Zithromax   - will diurese due to component of pulmonary  interstitial edema  -lasix 40 daily  - Chest physiotherapy with saline infused MetaNEB q4h   Physical therapy to help with recruitment of atelectatic segmetns  - IS at bedside - encourage to use 10x/h    COPD    - agree with current COPD  carepath    -continue Dulera and Spiriva    1cm spiculated left upper lobe nodule    - moderate pre-test probability of neoplasm   - will monitor and discuss with patient    - patient has established pulmonary with [facility] and will be evaluated for possible biopsy      Thank you for allowing me to participate in the care of this patient.    Patient/Family are satisfied with care plan and all questions have been answered.  This document was prepared using Dragon voice recognition software and may include unintentional dictation errors.     Ottie Glazier, M.D.  Division of [place]

## 2020-03-27 NOTE — Progress Notes (Signed)
[place] [facility] [place] NAME: Christian Fields    MR#:  [id number]  DATE OF BIRTH:  12-Jun-1956  SUBJECTIVE:  CHIEF COMPLAINT:   Chief Complaint  Patient presents with  . Drug Overdose  much more alert today, some sob & cough, still on 6 l HFNC, requesting gabapentin REVIEW OF SYSTEMS:  Review of Systems  Constitutional: Negative for diaphoresis, fever, malaise/fatigue and weight loss.  HENT: Negative for ear discharge, ear pain, hearing loss, nosebleeds, sore throat and tinnitus.   Eyes: Negative for blurred vision and pain.  Respiratory: Positive for cough and shortness of breath. Negative for hemoptysis and wheezing.   Cardiovascular: Negative for chest pain, palpitations, orthopnea and leg swelling.  Gastrointestinal: Negative for abdominal pain, blood in stool, constipation, diarrhea, heartburn, nausea and vomiting.  Genitourinary: Negative for dysuria, frequency and urgency.  Musculoskeletal: Negative for back pain and myalgias.  Skin: Negative for itching and rash.  Neurological: Negative for dizziness, tingling, tremors, focal weakness, seizures, weakness and headaches.  Psychiatric/Behavioral: Negative for depression. The patient is not nervous/anxious.    DRUG ALLERGIES:   Allergies  Allergen Reactions  . Bee Venom Anaphylaxis  . Hydrocodone-Acetaminophen Itching   VITALS:  Blood pressure 104/63, pulse 80, temperature 97.6 F (36.4 C), temperature source Oral, resp. rate 20, height 5\' 8"  (1.727 m), weight 79.4 kg, SpO2 99 %. PHYSICAL EXAMINATION:  Physical Exam HENT:     Head: Normocephalic and atraumatic.  Eyes:     Conjunctiva/sclera: Conjunctivae normal.     Pupils: Pupils are equal, round, and reactive to light.  Neck:     Thyroid: No thyromegaly.     Trachea: No tracheal deviation.  Cardiovascular:     Rate and Rhythm: Normal rate and regular rhythm.     Heart sounds: Normal heart sounds.  Pulmonary:     Effort: Pulmonary  effort is normal. No respiratory distress.     Breath sounds: Examination of the right-lower field reveals decreased breath sounds. Examination of the left-lower field reveals decreased breath sounds. Decreased breath sounds present. No wheezing.  Chest:     Chest wall: No tenderness.  Abdominal:     General: Bowel sounds are normal. There is no distension.     Palpations: Abdomen is soft.     Tenderness: There is no abdominal tenderness.  Musculoskeletal:        General: Normal range of motion.     Cervical back: Normal range of motion and neck supple.  Skin:    General: Skin is warm and dry.     Findings: No rash.  Neurological:     Mental Status: He is alert and oriented to person, place, and time.     Cranial Nerves: No cranial nerve deficit.    LABORATORY PANEL:  Male CBC Recent Labs  Lab 03/27/20 0424  WBC 13.6*  HGB 9.1*  HCT 28.8*  PLT 327   ------------------------------------------------------------------------------------------------------------------ Chemistries  Recent Labs  Lab 03/25/20 2023 03/26/20 0341 03/27/20 0424  NA 138   < > 139  K 3.7   < > 3.9  CL 93*   < > 97*  CO2 33*   < > 32  GLUCOSE 125*   < > 123*  BUN 12   < > 16  CREATININE 1.14   < > 1.06  CALCIUM 8.6*   < > 8.0*  AST 15  --   --   ALT 11  --   --  ALKPHOS 104  --   --   BILITOT 0.7  --   --    < > = values in this interval not displayed.   RADIOLOGY:  No results found. ASSESSMENT AND PLAN:   Acute on Chronic hypoxic respiratory Failure/CAP/aspiration pneumonia Patient hypoxic on his home O2. Stable with non-rebreather. CXR with patchy bilateral lung opacities most prominent in lower lung zones that may be atelectasis, aspiration, or pneumonia. Patient is afebrile but does have leukocytosis to 16. Baseline PCT is 0.21.  -continue supplemental O2 -currently requiring 6 L HFNC oxygen, wean to home dose as tolerated  -continue CTX and Azithromycin  -COVID negative  - CT chest  shows left lung consolidated pneumonia  - appreciate pulmo input. Lasix 40 mg daily  - Chest physiotherapy & incentive spirometry  Acute Metabolic Encephalopathy Patient presented with concern for medication overdose. Multiple pills in his medication list could cause altered mental status if taken excessively including Klonopin, Norco, Chantix, Cymbalta, Gabapentin, Suboxone.  Salicylate, ethanol, acetaminophen levels within normal limits. CT head showed no acute abnormalities. -UDS positive for Benzos -hold above potential offending medications with the exception of Suboxone to avoid withdrawal symptoms, can resume gabapentine today as he is more alert -neuro checks   COPD  Does not have any symptoms or exam findings concerning for acute exacerbation.  -Dulera and Spiriva   T2DM  Last A1c <6.0.  -hold Kombiglyze  sensitive SSI  -CBGs AC/HS  HTN   BP Normotensive.  -continue HCTZ 25 mg   Pulmo nodule 1 cm in LUL, outpt Pulmo f/up   Status is: Inpatient  Remains inpatient appropriate because:Inpatient level of care appropriate due to severity of illness   Dispo: The patient is from: Home              Anticipated d/c is to: Home              Anticipated d/c date is: 2 days              Patient currently is not medically stable to d/c.still on 6 L HFNC    DVT prophylaxis: Lovenox Family Communication: Updated patient's daughter Drue Dun @ [phone] (discussed CT findings) on 4/18   All the records are reviewed and case discussed with Care Management/Social Worker. Management plans discussed with the patient, nursing and they are in agreement.  CODE STATUS: Full Code  TOTAL TIME TAKING CARE OF THIS PATIENT: 35 minutes.   More than 50% of the time was spent in counseling/coordination of care: YES  POSSIBLE D/C IN 1-2 DAYS, DEPENDING ON CLINICAL CONDITION.   Max Sane M.D on 03/27/2020 at 8:38 PM  [facility]   CC: Primary care physician; Remi Haggard, FNP  Note: This dictation was prepared with Dragon dictation along with smaller phrase technology. Any transcriptional errors that result from this process are unintentional.

## 2020-03-27 NOTE — Progress Notes (Signed)
PHARMACIST - PHYSICIAN ORDER COMMUNICATION  CONCERNING: gemfibrozil and Simvastatin  and risk of rhabdomyolysis  DESCRIPTION:  Patients on gemfibrozil and simvastatin >10 mg/day have reported cases of rhabdomyolysis. Pharmacy is to assess simvastatin dose. If >10 mg, substitute atorvastatin (Lipitor) 1mg  for each 2mg  simvastatin.  This patient is ordered simvastatin 40 mg and gemfibrozil 600 mg.    ACTION TAKEN: Per protocol pharmacy has discontinued the patient's order for simvastatin and replaced it with Atorvastatin 20 mg.    Dallie Piles, PharmD Clinical Pharmacist 03/27/2020 7:36 AM

## 2020-03-28 DIAGNOSIS — R410 Disorientation, unspecified: Secondary | ICD-10-CM

## 2020-03-28 LAB — PROCALCITONIN: Procalcitonin: 0.34 ng/mL

## 2020-03-28 LAB — LEGIONELLA PNEUMOPHILA SEROGP 1 UR AG: L. pneumophila Serogp 1 Ur Ag: NEGATIVE

## 2020-03-28 LAB — BASIC METABOLIC PANEL
Anion gap: 14 (ref 5–15)
BUN: 15 mg/dL (ref 8–23)
CO2: 32 mmol/L (ref 22–32)
Calcium: 8.1 mg/dL — ABNORMAL LOW (ref 8.9–10.3)
Chloride: 94 mmol/L — ABNORMAL LOW (ref 98–111)
Creatinine, Ser: 1.05 mg/dL (ref 0.61–1.24)
GFR calc Af Amer: >60 mL/min (ref >60)
GFR calc non Af Amer: >60 mL/min (ref >60)
Glucose, Bld: 134 mg/dL — ABNORMAL HIGH (ref 70–99)
Potassium: 3.6 mmol/L (ref 3.5–5.1)
Sodium: 140 mmol/L (ref 135–145)

## 2020-03-28 LAB — CBC
HCT: 31.7 % — ABNORMAL LOW (ref 39.0–52.0)
Hemoglobin: 9.8 g/dL — ABNORMAL LOW (ref 13.0–17.0)
MCH: 29.2 pg (ref 26.0–34.0)
MCHC: 30.9 g/dL (ref 30.0–36.0)
MCV: 94.3 fL (ref 80.0–100.0)
Platelets: 394 10*3/uL (ref 150–400)
RBC: 3.36 MIL/uL — ABNORMAL LOW (ref 4.22–5.81)
RDW: 15.6 % — ABNORMAL HIGH (ref 11.5–15.5)
WBC: 12.3 10*3/uL — ABNORMAL HIGH (ref 4.0–10.5)
nRBC: 0 % (ref 0.0–0.2)

## 2020-03-28 LAB — GLUCOSE, CAPILLARY
Glucose-Capillary: 214 mg/dL — ABNORMAL HIGH (ref 70–99)
Glucose-Capillary: 155 mg/dL — ABNORMAL HIGH (ref 70–99)

## 2020-03-28 MED ORDER — FUROSEMIDE 10 MG/ML IJ SOLN
40.0000 mg | INTRAMUSCULAR | Status: AC
  Administered 2020-03-28: 40 mg via INTRAVENOUS
  Filled 2020-03-28: qty 4

## 2020-03-28 MED ORDER — AMOXICILLIN-POT CLAVULANATE 875-125 MG PO TABS: 1.0000 | ORAL_TABLET | Freq: Two times a day (BID) | ORAL | 0 refills | Status: AC

## 2020-03-28 NOTE — Progress Notes (Signed)
PT Cancellation Note  Patient Details Name: TAKAI CHIARAMONTE MRN: [id number] DOB: [birth year]   Cancelled Treatment:    Reason Eval/Treat Not Completed: Pt stated that he did not wish to receive PT services during this admission.  Pt encouraged to participate with PT evaluation to assess services and equipment needs with pt stating "I don't need you.  If I need anything I'll find it."  Will complete PT orders at this time but will reassess pt pending a change in status upon receipt of new PT orders.     Linus Salmons PT, DPT 03/28/20, 3:29 PM

## 2020-03-28 NOTE — Plan of Care (Signed)
Discharge order received. Patient mental status is at baseline. Vital signs stable . No signs of acute distress. Discharge instructions given. Patient verbalized understanding. No other issues noted at this time.   

## 2020-03-28 NOTE — Progress Notes (Signed)
Pulmonary Medicine          Date: 03/28/2020,   MRN# [id number] Christian Fields Jan 16, 1956     AdmissionWeight: 79.4 kg                 CurrentWeight: 79.4 kg   Referring physician: Dr Manuella Ghazi   CHIEF COMPLAINT:   Left lung pneumonia with increased O2 requirement   SUBJECTIVE    Patient is clinically imporved down to 2L/min today.    He is working with respiratory therapy.  He is able to take tidal volume now up to 1500cc with incentive spirometer.    Will perform exertional walk with O2 for d/c planning.     PAST MEDICAL HISTORY   Past Medical History:  Diagnosis Date  . Anemia   . Anxiety   . Arthritis   . Asthma   . Cancer ([place])    Basal Cell Skin Cancer  . Chronic back pain   . COPD (chronic obstructive pulmonary disease) ([place])   . Depression   . Diabetes mellitus ([place])   . Dyspnea   . GERD (gastroesophageal reflux disease)   . Gout   . Gout   . Headache   . History of blood clots    Left Leg--July 2018  . History of kidney stones   . Hyperlipidemia   . Hyperlipidemia   . Hypertension   . Kidney stones   . Neuropathy   . On home oxygen therapy    2 L / M  . Pneumonia 06/2017  . Sleep apnea   . Ulcer of foot ([place])    Right     SURGICAL HISTORY   Past Surgical History:  Procedure Laterality Date  . APPENDECTOMY    . DG FEET 2 VIEWS BILAT    . LIPOMA EXCISION Right 08/15/2017   Procedure: EXCISION TUMOR(CYST) FOOT;  Surgeon: Albertine Patricia, DPM;  Location: [facility] ORS;  Service: Podiatry;  Laterality: Right;  . OTHER SURGICAL HISTORY Bilateral Foot surgery     FAMILY HISTORY   Family History  Problem Relation Age of Onset  . Hypertension Mother      SOCIAL HISTORY   Social History   Tobacco Use  . Smoking status: Former Smoker    Packs/day: 0.50    Types: Cigarettes    Start date: 2018  . Smokeless tobacco: Never Used  Substance Use Topics  . Alcohol use: Yes    Alcohol/week: 1.0 standard drinks    Types: 1 Cans of  beer per week    Comment: occ  . Drug use: No     MEDICATIONS    Home Medication:    Current Medication:  Current Facility-Administered Medications:  .  0.9 %  sodium chloride infusion, , Intravenous, PRN, Max Sane, MD, Stopped at 03/27/20 0120 .  albumin human 25 % solution 12.5 g, 12.5 g, Intravenous, Daily, Lanney Gins, Finch Costanzo, MD, Last Rate: 60 mL/hr at 03/28/20 0919, 12.5 g at 03/28/20 0919 .  atorvastatin (LIPITOR) tablet 20 mg, 20 mg, Oral, q1800, Dallie Piles, RPH, 20 mg at 03/27/20 1826 .  azithromycin (ZITHROMAX) 500 mg in sodium chloride 0.9 % 250 mL IVPB, 500 mg, Intravenous, Q24H, Nicolette Bang, DO, Last Rate: 250 mL/hr at 03/27/20 2233, 500 mg at 03/27/20 2233 .  buprenorphine-naloxone (SUBOXONE) 8-2 mg per SL tablet 1 tablet, 1 tablet, Sublingual, Daily, Nicolette Bang, DO, 1 tablet at 03/28/20 [phone] .  cefTRIAXone (ROCEPHIN) 2 g in sodium chloride 0.9 %  100 mL IVPB, 2 g, Intravenous, Q24H, Nicolette Bang, DO, Last Rate: 200 mL/hr at 03/27/20 2348, 2 g at 03/27/20 2348 .  dabigatran (PRADAXA) capsule 150 mg, 150 mg, Oral, BID, Nicolette Bang, DO, 150 mg at 03/28/20 1054 .  diphenhydrAMINE (BENADRYL) 25 mg in sodium chloride 0.9 % 50 mL IVPB, 25 mg, Intravenous, Q6H PRN, Manuella Ghazi, Vipul, MD .  furosemide (LASIX) injection 40 mg, 40 mg, Intravenous, Daily, Lanney Gins, Cataleyah Colborn, MD, 40 mg at 03/28/20 0916 .  gabapentin (NEURONTIN) capsule 800 mg, 800 mg, Oral, QID, Max Sane, MD, 800 mg at 03/28/20 0916 .  gemfibrozil (LOPID) tablet 600 mg, 600 mg, Oral, BID AC, Nicolette Bang, DO, 600 mg at 03/28/20 1054 .  insulin aspart (novoLOG) injection 0-9 Units, 0-9 Units, Subcutaneous, TID WC, Nicolette Bang, DO, 3 Units at 03/28/20 0915 .  mometasone-formoterol (DULERA) 200-5 MCG/ACT inhaler 2 puff, 2 puff, Inhalation, BID, Nicolette Bang, DO, 2 puff at 03/28/20 [phone] .  pantoprazole (PROTONIX) EC tablet 40 mg, 40  mg, Oral, Daily, Nicolette Bang, DO, 40 mg at 03/28/20 0916 .  tiotropium (SPIRIVA) inhalation capsule ([facility] use ONLY) 18 mcg, 1 capsule, Inhalation, Daily, Nicolette Bang, DO, 18 mcg at 03/28/20 1191    ALLERGIES   Bee venom and Hydrocodone-acetaminophen     REVIEW OF SYSTEMS    Review of Systems:  Gen:  Denies  fever, sweats, chills weigh loss  HEENT: Denies blurred vision, double vision, ear pain, eye pain, hearing loss, nose bleeds, sore throat Cardiac:  No dizziness, chest pain or heaviness, chest tightness,edema Resp:   Reports mild cough imporving Gi: Denies swallowing difficulty, stomach pain, nausea or vomiting, diarrhea, constipation, bowel incontinence Gu:  Denies bladder incontinence, burning urine Ext:   Denies Joint pain, stiffness or swelling Skin: Denies  skin rash, easy bruising or bleeding or hives Endoc:  Denies polyuria, polydipsia , polyphagia or weight change Psych:   Denies depression, insomnia or hallucinations   Other:  All other systems negative   VS: BP 113/67 (BP Location: Right Arm)   Pulse 89   Temp 98.2 F (36.8 C) (Oral)   Resp 20   Ht 5\' 8"  (1.727 m)   Wt 79.4 kg   SpO2 96%   BMI 26.61 kg/m      PHYSICAL EXAM    GENERAL:NAD, no fevers, chills, no weakness no fatigue HEAD: Normocephalic, atraumatic.  EYES: Pupils equal, round, reactive to light. Extraocular muscles intact. No scleral icterus.  MOUTH: Moist mucosal membrane. Dentition intact. No abscess noted.  EAR, NOSE, THROAT: Clear without exudates. No external lesions.  NECK: Supple. No thyromegaly. No nodules. No JVD.  PULMONARY: bilateral rhonchi CARDIOVASCULAR: S1 and S2. Regular rate and rhythm. No murmurs, rubs, or gallops. No edema. Pedal pulses 2+ bilaterally.  GASTROINTESTINAL: Soft, nontender, nondistended. No masses. Positive bowel sounds. No hepatosplenomegaly.  MUSCULOSKELETAL: No swelling, clubbing, or edema. Range of motion full in all  extremities.  NEUROLOGIC: Cranial nerves II through XII are intact. No gross focal neurological deficits. Sensation intact. Reflexes intact.  SKIN: No ulceration, lesions, rashes, or cyanosis. Skin warm and dry. Turgor intact.  PSYCHIATRIC: Mood, affect within normal limits. The patient is awake, alert and oriented x 3. Insight, judgment intact.       IMAGING    CT HEAD WO CONTRAST  Result Date: 03/25/2020 CLINICAL DATA:  Altered mental status and weakness today. EXAM: CT HEAD WITHOUT CONTRAST TECHNIQUE: Contiguous axial images were obtained from the base of  the skull through the vertex without intravenous contrast. COMPARISON:  01/19/2016 FINDINGS: Brain: Moderate diffuse enlargement of the ventricles and subarachnoid spaces with progression. Mild to moderate patchy white matter low density in both cerebral hemispheres with progression. 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 No intracranial hemorrhage, mass lesion or CT evidence of acute infarction. Vascular: No hyperdense vessel or unexpected calcification. Skull: Normal. Negative for fracture or focal lesion. Sinuses/Orbits: Status post bilateral cataract extraction. Unremarkable bones and included paranasal sinuses. Other: None. IMPRESSION: 1. No acute abnormality. 2. Moderate diffuse cerebral and cerebellar atrophy with progression. 3. Mild to moderate chronic small vessel white matter ischemic changes in both cerebral hemispheres with progression. 4. 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 Electronically Signed   By: Claudie Revering M.D.   On: 03/25/2020 21:03   CT ANGIO CHEST PE W OR WO CONTRAST  Result Date: 03/26/2020 CLINICAL DATA:  Shortness of breath EXAM: CT ANGIOGRAPHY CHEST WITH CONTRAST TECHNIQUE: Multidetector CT imaging of the chest was performed using the standard protocol during bolus administration of intravenous contrast. Multiplanar CT image reconstructions and MIPs were obtained to evaluate the vascular anatomy.  CONTRAST:  3mL OMNIPAQUE IOHEXOL 350 MG/ML SOLN COMPARISON:  None. FINDINGS: Cardiovascular: Some of the most peripheral segmental and subsegmental pulmonary artery branches are difficult to definitively characterize due to patient breathing motion artifact, however, there is no pulmonary embolism identified within the main, lobar or central segmental pulmonary arteries bilaterally. No thoracic aortic aneurysm or evidence of aortic dissection. Aortic atherosclerosis. No pericardial effusion. Mediastinum/Nodes: Moderately enlarged lymph nodes within the bilateral perihilar regions. Scattered small lymph nodes within the mediastinum. Esophagus is unremarkable. Trachea appears normal. Lungs/Pleura: Dense consolidation occupying the majority of the LEFT lower lobe. Additional smaller/patchy consolidations within the RIGHT lower lobe, lingula and RIGHT middle lobe. Small LEFT pleural effusion. Irregular pulmonary nodule within the inferolateral aspect of the LEFT upper lobe, measuring 1 cm greatest dimension (series 6, image 29). Upper Abdomen: Limited images of the upper abdomen are unremarkable. Musculoskeletal: Multiple old healed rib fractures on the LEFT. No acute appearing osseous abnormality. Review of the MIP images confirms the above findings. IMPRESSION: 1. Dense consolidations at the bilateral lung bases, LEFT greater than RIGHT, compatible with multifocal pneumonia and/or aspiration. Small LEFT pleural effusion. 2. Moderately enlarged lymph nodes within the bilateral perihilar regions, most likely reactive. 3. **An incidental finding of potential clinical significance has been found. Irregular pulmonary nodule within the inferolateral aspect of the LEFT upper lobe, measuring 1 cm greatest dimension, suspicious for a neoplastic nodule, alternatively an additional focus of pneumonia. Recommend short-term follow-up chest CT in 3 months to ensure stability or resolution.** 4. Small LEFT pleural effusion. 5. No  pulmonary embolism seen, with mild study limitations detailed above. Aortic Atherosclerosis (ICD10-I70.0). Electronically Signed   By: Franki Cabot M.D.   On: 03/26/2020 14:47   DG Chest Portable 1 View  Result Date: 03/25/2020 CLINICAL DATA:  Shortness of breath. Confusion. Medication overdose. EXAM: PORTABLE CHEST 1 VIEW COMPARISON:  Radiograph 05/12/2017 FINDINGS: Normal heart size and mediastinal contours. Aortic atherosclerosis. Patchy bilateral lung opacities most prominent in the lower lung zones. No significant pleural fluid. No pneumothorax. Remote left rib fracture. IMPRESSION: Patchy bilateral lung opacities most prominent in the lower lung zones, may be atelectasis, aspiration, or pneumonia. Aortic Atherosclerosis (ICD10-I70.0). Electronically Signed   By: Keith Rake M.D.   On: 03/25/2020 20:37      ASSESSMENT/PLAN   Acute hypoxemic respiratory failure  - due to  left lung consolidated pneumonia   - noted procal and MRSA nasal PCR negative   -agree with Rocephin Zithromax   - will diurese due to component of pulmonary interstitial edema  -lasix 40 daily  - Chest physiotherapy with saline infused MetaNEB q4h   Physical therapy to help with recruitment of atelectatic segmetns  - IS at bedside - encourage to use 10x/h    COPD    - agree with current COPD  carepath    -continue Dulera and Spiriva    1cm spiculated left upper lobe nodule    - moderate pre-test probability of neoplasm   - will monitor and discuss with patient    - patient has [facility] pulmonary with [facility] and will be evaluated for possible biopsy      Thank you for allowing me to participate in the care of this patient.   Patient/Family are satisfied with care plan and all questions have been answered.   This document was prepared using Dragon voice recognition software and may include unintentional dictation errors.     Ottie Glazier, M.D.  Division of [place]

## 2020-03-28 NOTE — TOC Transition Note (Signed)
Transition of Care [facility]) - CM/SW Discharge Note   Patient Details  Name: Christian Fields MRN: [id number] Date of Birth: July 28, 1956  [facility] CM/SW Contact:  Candie Chroman, LCSW Phone Number: 03/28/2020, 2:38 PM   Clinical Narrative: Patient has orders to discharge home today. No further concerns. CSW signing off.    Final next level of care: Home/Self Care Barriers to Discharge: Barriers Resolved   Patient Goals and CMS Choice        Discharge Placement                    Patient and family notified of of transfer: 03/28/20  Discharge Plan and Services                                     Social Determinants of Health (SDOH) Interventions     Readmission Risk Interventions No flowsheet data found.

## 2020-03-28 NOTE — TOC Initial Note (Signed)
Transition of Care [facility]) - Initial/Assessment Note    Patient Details  Name: Christian Fields MRN: [id number] Date of Birth: 20-Jul-1956  [facility]) CM/SW Contact:    Candie Chroman, LCSW Phone Number: 03/28/2020, 2:25 PM  Clinical Narrative:  CSW met with patient. No supports at bedside. CSW introduced role and explained that discharge planning would be discussed. Patient is not interested in home health. He was recently discharged from [facility] (3/22) where he was getting nursing. Patient confirmed he is on oxygen at home and said he gets it through [facility]. No further concerns. CSW encouraged patient to contact CSW as needed. CSW will continue to follow patient for support and facilitate return home when stable.                Expected Discharge Plan: Home/Self Care Barriers to Discharge: Continued Medical Work up   Patient Goals and CMS Choice        Expected Discharge Plan and Services Expected Discharge Plan: Home/Self Care       Living arrangements for the past 2 months: Single Family Home                                      Prior Living Arrangements/Services Living arrangements for the past 2 months: Single Family Home   Patient language and need for interpreter reviewed:: Yes Do you feel safe going back to the place where you live?: Yes      Need for Family Participation in Patient Care: Yes (Comment) Care giver support system in place?: Yes (comment)   Criminal Activity/Legal Involvement Pertinent to Current Situation/Hospitalization: No - Comment as needed  Activities of Daily Living Home Assistive Devices/Equipment: None ADL Screening (condition at time of admission) Patient's cognitive ability adequate to safely complete daily activities?: Yes Is the patient deaf or have difficulty hearing?: No Does the patient have difficulty seeing, even when wearing glasses/contacts?: No Does the patient have difficulty concentrating,  remembering, or making decisions?: No Patient able to express need for assistance with ADLs?: Yes Does the patient have difficulty dressing or bathing?: No Independently performs ADLs?: Yes (appropriate for developmental age) Does the patient have difficulty walking or climbing stairs?: Yes Weakness of Legs: Both Weakness of Arms/Hands: None  Permission Sought/Granted                  Emotional Assessment Appearance:: Appears stated age Attitude/Demeanor/Rapport: Engaged, Gracious Affect (typically observed): Accepting, Appropriate, Calm, Pleasant Orientation: : Oriented to Self, Oriented to Place, Oriented to  Time, Oriented to Situation Alcohol / Substance Use: Not Applicable Psych Involvement: No (comment)  Admission diagnosis:  Confusion [R41.0] Community acquired pneumonia [J18.9] Acute on chronic respiratory failure with hypoxia (HCC) [J96.21] Pneumonia of both lower lobes due to infectious organism [J18.9] Acute respiratory failure ([place]) [J96.00] Patient Active Problem List   Diagnosis Date Noted  . Acute respiratory failure ([place]) 03/26/2020  . Community acquired pneumonia 03/25/2020  . Arthritis 10/29/2019  . Hemorrhoids 10/29/2019  . Senile nuclear sclerosis, bilateral 02/15/2019  . Cellulitis of left upper limb 02/11/2019  . AAA (abdominal aortic aneurysm) without rupture ([place]) 01/15/2019  . Localized swelling, mass and lump, upper limb 12/24/2018  . Pain in femur 12/24/2018  . TIA (transient ischemic attack) 11/18/2018  . Cortical age-related cataract of both eyes 11/02/2018  . Fracture of multiple ribs 04/20/2018  . Overweight (BMI 25.0-29.9) 04/20/2018  .  Hypertension 07/08/2017  . Diabetes mellitus ([place]) 07/08/2017  . Hyperlipidemia 07/08/2017  . Tobacco use disorder 07/08/2017  . Atherosclerosis of native arteries of extremity with intermittent claudication ([place]) 07/08/2017  . SOB (shortness of breath) 06/19/2017  . Acute on chronic respiratory failure  with hypoxia and hypercapnia ([place]) 12/15/2016  . CAP (community acquired pneumonia) 12/15/2016  . Chronic pain 12/15/2016  . Chronically on opiate therapy 12/15/2016  . History of kidney stones 12/15/2016  . Polypharmacy 12/15/2016  . Somnolence 12/15/2016  . Foot ulcer ([place]) 01/19/2016  . 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 Other psychoactive substance use, unspecified with withdrawal, uncomplicated ([place]) 47/15/8063  . Type 2 diabetes mellitus with foot ulcer (CODE) ([place]) 11/27/2015  . Chronic obstructive pulmonary disease, unspecified ([place]) 01/18/2013  . Depressive disorder 01/18/2013  . Hereditary and idiopathic peripheral neuropathy 01/18/2013  . Sleep apnea 01/18/2013  . Low back pain 11/23/2010  . Acute gouty arthropathy 07/10/2010  . Problem related to lifestyle 10/29/2004   PCP:  Remi Haggard, FNP Pharmacy:   [place], [place] [address] Phone: [phone] Fax: [phone]     Social Determinants of Health (SDOH) Interventions    Readmission Risk Interventions No flowsheet data found.

## 2020-03-28 NOTE — Progress Notes (Addendum)
SATURATION QUALIFICATIONS: (This note is used to comply with regulatory documentation for home oxygen)  Patient Saturations on Room Air at Rest =92 %  Patient Saturations on Room Air while Ambulating = 84 %  Patient Saturations on 2 Liters [employer] of oxygen while Ambulating = 88%  Please briefly explain why patient needs home oxygen:

## 2020-03-30 NOTE — Discharge Summary (Signed)
[facility] NAME: Christian Fields    MR#:  [id number]  DATE OF BIRTH:  [birth year]  DATE OF ADMISSION:  03/25/2020   ADMITTING PHYSICIAN: Max Sane, MD  DATE OF DISCHARGE: 03/28/2020  5:31 PM  PRIMARY CARE PHYSICIAN: Remi Haggard, FNP   ADMISSION DIAGNOSIS:  Confusion [R41.0] Community acquired pneumonia [J18.9] Acute on chronic respiratory failure with hypoxia ([place]) [J96.21] Pneumonia of both lower lobes due to infectious organism [J18.9] Acute respiratory failure ([place]) [J96.00] DISCHARGE DIAGNOSIS:  Active Problems:   Foot ulcer ([place])   Hypertension   Diabetes mellitus ([place])   Hyperlipidemia   Atherosclerosis of native arteries of extremity with intermittent claudication (HCC)   Acute on chronic respiratory failure with hypoxia and hypercapnia (HCC)   Chronic obstructive pulmonary disease, unspecified (HCC)   Chronically on opiate therapy   Polypharmacy   Pneumonia of both lower lobes due to infectious organism   Acute on chronic respiratory failure with hypoxia ([place])   Confusion  SECONDARY DIAGNOSIS:   Past Medical History:  Diagnosis Date  . Anemia   . Anxiety   . Arthritis   . Asthma   . Cancer ([place])    Basal Cell Skin Cancer  . Chronic back pain   . COPD (chronic obstructive pulmonary disease) ([place])   . Depression   . Diabetes mellitus ([place])   . Dyspnea   . GERD (gastroesophageal reflux disease)   . Gout   . Gout   . Headache   . History of blood clots    Left Leg--July 2018  . History of kidney stones   . Hyperlipidemia   . Hyperlipidemia   . Hypertension   . Kidney stones   . Neuropathy   . On home oxygen therapy    2 L / M  . Pneumonia 06/2017  . Sleep apnea   . Ulcer of foot ([place])    Right   HOSPITAL COURSE:  Acute on Chronic hypoxic respiratory Failure/CAP/aspiration pneumonia  CXR with patchy bilateral lung opacities most prominent in lower lung zones that may be atelectasis, aspiration, or  pneumonia. Patient was afebrile but had leukocytosis to 16. Baseline PCT is 0.21.  -treated with CTX and Azithromycin with good response. Back to his baseline 2-3 Liters O2 -COVID negative  - CT chest shows left lung consolidated pneumonia  - outpt Pulmo f/up  Acute Metabolic Encephalopathy Patient presented with concern for medication overdose.Multiple pills in his medication list could cause altered mental status if taken excessively including Klonopin,Norco, Chantix, Cymbalta, Gabapentin, Suboxone. Salicylate, ethanol, acetaminophen levels within normal limits. CT head showed no acute abnormalities. -UDS positive for Benzos - now back to baseline. Lots of meds held while in the Hospital which were potential contributor.  COPD  Does not have any symptoms or exam findings concerning for acute exacerbation.  -Dulera and Spiriva   T2DM  Last A1c <6.0.  Continue home regimen  HTN  BP Normotensive.  -continue HCTZ 25 mg  Pulmo nodule 1 cm in LUL, outpt Pulmo f/up. Seen by Dr Lanney Gins while in the [facility]:  stable CONSULTS OBTAINED:  Treatment Team:  Ottie Glazier, MD DRUG ALLERGIES:   Allergies  Allergen Reactions  . Bee Venom Anaphylaxis  . Hydrocodone-Acetaminophen Itching   DISCHARGE MEDICATIONS:   Allergies as of 03/28/2020      Reactions   Bee Venom Anaphylaxis   Hydrocodone-acetaminophen Itching      Medication List    STOP taking these  medications   HYDROcodone-acetaminophen 5-325 MG tablet Commonly known as: Norco     TAKE these medications   albuterol 108 (90 Base) MCG/ACT inhaler Commonly known as: VENTOLIN HFA Inhale 2 puffs into the lungs every 6 (six) hours as needed for wheezing or shortness of breath.   alendronate 70 MG tablet Commonly known as: FOSAMAX Take 70 mg by mouth once a week.   allopurinol 300 MG tablet Commonly known as: ZYLOPRIM Take 600 mg by mouth 2 (two) times daily.     amoxicillin-clavulanate 875-125 MG tablet Commonly known as: Augmentin Take 1 tablet by mouth every 12 (twelve) hours for 7 days.   aspirin EC 81 MG tablet Take 1 tablet by mouth daily.   buprenorphine-naloxone 8-2 mg Subl SL tablet Commonly known as: SUBOXONE Place 1 tablet under the tongue 3 (three) times daily.   cholecalciferol 1000 units tablet Commonly known as: VITAMIN D Take 1,000 Units by mouth daily.   clopidogrel 75 MG tablet Commonly known as: PLAVIX Take 75 mg by mouth daily.   DULoxetine 30 MG capsule Commonly known as: CYMBALTA Take 1 capsule by mouth daily.   ezetimibe 10 MG tablet Commonly known as: ZETIA Take 10 mg by mouth daily.   ferrous sulfate 325 (65 FE) MG tablet Take 325 mg by mouth daily with breakfast.   fluticasone 50 MCG/ACT nasal spray Commonly known as: FLONASE Place 2 sprays into both nostrils daily.   gabapentin 800 MG tablet Commonly known as: NEURONTIN Take 800 mg by mouth 4 (four) times daily.   hydrochlorothiazide 25 MG tablet Commonly known as: HYDRODIURIL Take 25 mg by mouth daily.   ipratropium 0.02 % nebulizer solution Commonly known as: ATROVENT Inhale 2.5 mL (500 mcg total) by nebulization every 6 (six) hours.   Kombiglyze XR 2.04-999 MG Tb24 Generic drug: Saxagliptin-Metformin Take 1 tablet by mouth 2 (two) times daily.   metoprolol succinate 50 MG 24 hr tablet Commonly known as: TOPROL-XL Take 50 mg by mouth daily.   pantoprazole 40 MG tablet Commonly known as: PROTONIX Take 40 mg by mouth daily.   simvastatin 40 MG tablet Commonly known as: ZOCOR Take 1 tablet by mouth daily at 6 PM.   Trelegy Ellipta 100-62.5-25 MCG/INH Aepb Generic drug: Fluticasone-Umeclidin-Vilant Inhale 1 puff into the lungs daily.   Vascepa 1 g capsule Generic drug: icosapent Ethyl Take 2 g by mouth 2 (two) times daily.      DISCHARGE INSTRUCTIONS:   DIET:  Cardiac diet DISCHARGE CONDITION:  Stable ACTIVITY:  Activity  as tolerated OXYGEN:  Home Oxygen: Yes.    Oxygen Delivery: 2-3 liters/min via Patient connected to nasal cannula oxygen DISCHARGE LOCATION:  home   If you experience worsening of your admission symptoms, develop shortness of breath, life threatening emergency, suicidal or homicidal thoughts you must seek medical attention immediately by calling 911 or calling your MD immediately  if symptoms less severe.  You Must read complete instructions/literature along with all the possible adverse reactions/side effects for all the Medicines you take and that have been prescribed to you. Take any new Medicines after you have completely understood and accpet all the possible adverse reactions/side effects.   Please note  You were cared for by a hospitalist during your hospital stay. If you have any questions about your discharge medications or the care you received while you were in the hospital after you are discharged, you can call the unit and asked to speak with the hospitalist on call if the hospitalist that  took care of you is not available. Once you are discharged, your primary care physician will handle any further medical issues. Please note that NO REFILLS for any discharge medications will be authorized once you are discharged, as it is imperative that you return to your primary care physician (or establish a relationship with a primary care physician if you do not have one) for your aftercare needs so that they can reassess your need for medications and monitor your lab values.    On the day of Discharge:  VITAL SIGNS:  Blood pressure 112/72, pulse (!) 106, temperature 99 F (37.2 C), temperature source Oral, resp. rate 20, height 5\' 8"  (1.727 m), weight 79.4 kg, SpO2 91 %. PHYSICAL EXAMINATION:  GENERAL:  64 y.o.-year-old patient lying in the bed with no acute distress.  EYES: Pupils equal, round, reactive to light and accommodation. No scleral icterus. Extraocular muscles intact.  HEENT:  Head atraumatic, normocephalic. Oropharynx and nasopharynx clear.  NECK:  Supple, no jugular venous distention. No thyroid enlargement, no tenderness.  LUNGS: Normal breath sounds bilaterally, no wheezing, rales,rhonchi or crepitation. No use of accessory muscles of respiration.  CARDIOVASCULAR: S1, S2 normal. No murmurs, rubs, or gallops.  ABDOMEN: Soft, non-tender, non-distended. Bowel sounds present. No organomegaly or mass.  EXTREMITIES: No pedal edema, cyanosis, or clubbing.  NEUROLOGIC: Cranial nerves II through XII are intact. Muscle strength 5/5 in all extremities. Sensation intact. Gait not checked.  PSYCHIATRIC: The patient is alert and oriented x 3.  SKIN: No obvious rash, lesion, or ulcer.  DATA REVIEW:   CBC Recent Labs  Lab 03/28/20 0427  WBC 12.3*  HGB 9.8*  HCT 31.7*  PLT 394    Chemistries  Recent Labs  Lab 03/25/20 2023 03/26/20 0341 03/28/20 0427  NA 138   < > 140  K 3.7   < > 3.6  CL 93*   < > 94*  CO2 33*   < > 32  GLUCOSE 125*   < > 134*  BUN 12   < > 15  CREATININE 1.14   < > 1.05  CALCIUM 8.6*   < > 8.1*  AST 15  --   --   ALT 11  --   --   ALKPHOS 104  --   --   BILITOT 0.7  --   --    < > = values in this interval not displayed.     Outpatient follow-up Follow-up Information    Remi Haggard, FNP. Go on 03/29/2020.   Specialty: Family Medicine Why: Go at 1:45pm. Contact information: [place] [address] [phone]        Ottie Glazier, MD. Go on 04/11/2020.   Specialty: Pulmonary Disease Why: Go at 2:45pm. Contact information: [place] [address] [phone]            Management plans discussed with the patient, family and they are in agreement.  CODE STATUS: Prior   TOTAL TIME TAKING CARE OF THIS PATIENT: 45 minutes.    Max Sane M.D on 03/30/2020 at 11:45 AM  [facility]   CC: Primary care physician; Remi Haggard, FNP   Note: This dictation was  prepared with Dragon dictation along with smaller phrase technology. Any transcriptional errors that result from this process are unintentional.

## 2020-04-05 ENCOUNTER — Ambulatory Visit: Payer: Medicare HMO | Attending: Internal Medicine

## 2020-04-05 DIAGNOSIS — Z23 Encounter for immunization: Secondary | ICD-10-CM

## 2020-04-05 NOTE — Progress Notes (Signed)
   Covid-19 Vaccination Clinic  Name:  WOODLEY PETZOLD    MRN: [id number] DOB: 1956-03-22  04/05/2020  Mr. Wempe was observed post Covid-19 immunization for 15 minutes without incident. He was provided with Vaccine Information Sheet and instruction to access the V-Safe system.   Mr. Hinshaw was instructed to call 911 with any severe reactions post vaccine: [name] Kitchen Difficulty breathing  . Swelling of face and throat  . A fast heartbeat  . A bad rash all over body  . Dizziness and weakness   Immunizations Administered    Name Date Dose VIS Date Route   Pfizer COVID-19 Vaccine 04/05/2020  4:35 PM 0.3 mL 02/02/2019 Intramuscular   Manufacturer: [place]   Lot: [id number]   Bono: [id number]

## 2020-04-06 ENCOUNTER — Other Ambulatory Visit: Payer: Self-pay | Admitting: Specialist

## 2020-04-06 DIAGNOSIS — R918 Other nonspecific abnormal finding of lung field: Secondary | ICD-10-CM

## 2020-04-06 DIAGNOSIS — R0602 Shortness of breath: Secondary | ICD-10-CM

## 2020-04-06 DIAGNOSIS — R911 Solitary pulmonary nodule: Secondary | ICD-10-CM

## 2020-04-28 DIAGNOSIS — M7989 Other specified soft tissue disorders: Secondary | ICD-10-CM | POA: Insufficient documentation

## 2020-05-22 ENCOUNTER — Telehealth: Payer: Self-pay | Admitting: Radiology

## 2020-05-31 ENCOUNTER — Ambulatory Visit: Payer: Medicare HMO

## 2020-07-05 ENCOUNTER — Ambulatory Visit: Payer: Medicare HMO

## 2020-07-18 ENCOUNTER — Ambulatory Visit: Admission: RE | Admit: 2020-07-18 | Payer: Medicare HMO | Source: Ambulatory Visit

## 2020-08-08 ENCOUNTER — Other Ambulatory Visit: Payer: Self-pay

## 2020-08-08 ENCOUNTER — Ambulatory Visit
Admission: RE | Admit: 2020-08-08 | Discharge: 2020-08-08 | Disposition: A | Discharge status: Home / Self Care | Payer: Medicare HMO | Source: Ambulatory Visit | Attending: Specialist | Admitting: Specialist

## 2020-08-08 DIAGNOSIS — R0602 Shortness of breath: Secondary | ICD-10-CM | POA: Insufficient documentation

## 2020-08-08 DIAGNOSIS — R911 Solitary pulmonary nodule: Secondary | ICD-10-CM | POA: Insufficient documentation

## 2020-08-08 DIAGNOSIS — R918 Other nonspecific abnormal finding of lung field: Secondary | ICD-10-CM | POA: Insufficient documentation

## 2020-08-11 ENCOUNTER — Other Ambulatory Visit: Payer: Self-pay | Admitting: Specialist

## 2020-08-11 DIAGNOSIS — R0600 Dyspnea, unspecified: Secondary | ICD-10-CM

## 2020-08-11 DIAGNOSIS — R918 Other nonspecific abnormal finding of lung field: Secondary | ICD-10-CM

## 2020-08-11 DIAGNOSIS — R0609 Other forms of dyspnea: Secondary | ICD-10-CM

## 2020-08-11 DIAGNOSIS — Z9981 Dependence on supplemental oxygen: Secondary | ICD-10-CM

## 2020-08-11 DIAGNOSIS — J439 Emphysema, unspecified: Secondary | ICD-10-CM

## 2020-08-15 ENCOUNTER — Encounter: Payer: Self-pay | Admitting: *Deleted

## 2020-08-16 NOTE — Telephone Encounter (Signed)
Message sent in error, please disreguard.

## 2020-08-22 ENCOUNTER — Encounter: Payer: Self-pay | Admitting: *Deleted

## 2020-08-22 ENCOUNTER — Encounter: Payer: Self-pay | Admitting: Oncology

## 2020-08-22 DIAGNOSIS — R911 Solitary pulmonary nodule: Secondary | ICD-10-CM

## 2020-08-22 NOTE — Progress Notes (Addendum)
  Oncology Nurse Navigator Documentation  Navigator Location: [facility]Med [facility]c (08/22/20 1500) Referral Date to RadOnc/MedOnc: 08/15/20 (08/22/20 1500) )Navigator Encounter Type: Introductory Phone Call (08/22/20 1500)   Abnormal Finding Date: 08/08/20 (08/22/20 1500)                     Barriers/Navigation Needs: Coordination of Care (08/22/20 1500)   Interventions: Coordination of Care (08/22/20 1500)   Coordination of Care: Appts (08/22/20 1500)       contacted pt to review upcoming appts and to inform of scheduled appt with Dr. Janese Banks. Left message with patient to call back to further discuss and answer any questions he may have.  Contacted pt on 08/23/20 at 1105 to review upcoming appts for PET scan on 9/16 and appt with Dr. Janese Banks and Dr. Baruch Gouty on Fri 9/17. All questions answered during call. Contact info given and instructed to call with any further questions or needs. Pt verbalized understanding. Nothing further needed at this time.            Time Spent with Patient: 30 (08/22/20 1500)

## 2020-08-22 NOTE — Progress Notes (Signed)
Patient noted to have an enlarging left upper lobe nodule concerning for lung cancer.  PET CT scan is required for further work-up.  Dr. Randa Evens, MD, MPH [facility] at [facility] [phone] 08/22/2020 3:48 PM

## 2020-08-24 ENCOUNTER — Other Ambulatory Visit: Payer: Self-pay

## 2020-08-24 ENCOUNTER — Encounter
Admission: RE | Admit: 2020-08-24 | Discharge: 2020-08-24 | Disposition: A | Discharge status: Home / Self Care | Payer: Medicare HMO | Source: Ambulatory Visit | Attending: Specialist | Admitting: Specialist

## 2020-08-24 DIAGNOSIS — R0609 Other forms of dyspnea: Secondary | ICD-10-CM

## 2020-08-24 DIAGNOSIS — R918 Other nonspecific abnormal finding of lung field: Secondary | ICD-10-CM

## 2020-08-24 DIAGNOSIS — J439 Emphysema, unspecified: Secondary | ICD-10-CM | POA: Diagnosis present

## 2020-08-24 DIAGNOSIS — Z9981 Dependence on supplemental oxygen: Secondary | ICD-10-CM | POA: Diagnosis present

## 2020-08-24 DIAGNOSIS — R0600 Dyspnea, unspecified: Secondary | ICD-10-CM | POA: Insufficient documentation

## 2020-08-24 LAB — GLUCOSE, CAPILLARY: Glucose-Capillary: 118 mg/dL — ABNORMAL HIGH (ref 70–99)

## 2020-08-24 MED ORDER — FLUDEOXYGLUCOSE F - 18 (FDG) INJECTION
9.1000 | Freq: Once | INTRAVENOUS | Status: AC | PRN
  Administered 2020-08-24: 9.52 via INTRAVENOUS

## 2020-08-25 ENCOUNTER — Ambulatory Visit
Admission: RE | Admit: 2020-08-25 | Discharge: 2020-08-25 | Disposition: A | Discharge status: Home / Self Care | Payer: Medicare HMO | Source: Ambulatory Visit | Attending: Radiation Oncology | Admitting: Radiation Oncology

## 2020-08-25 ENCOUNTER — Encounter: Payer: Self-pay | Admitting: *Deleted

## 2020-08-25 ENCOUNTER — Inpatient Hospital Stay: Payer: Medicare HMO | Attending: Oncology | Admitting: Oncology

## 2020-08-25 VITALS — BP 135/83 | HR 98 | Temp 96.2°F | Resp 16 | Wt 174.4 lb

## 2020-08-25 DIAGNOSIS — Z86718 Personal history of other venous thrombosis and embolism: Secondary | ICD-10-CM | POA: Insufficient documentation

## 2020-08-25 DIAGNOSIS — Z8616 Personal history of COVID-19: Secondary | ICD-10-CM | POA: Insufficient documentation

## 2020-08-25 DIAGNOSIS — J449 Chronic obstructive pulmonary disease, unspecified: Secondary | ICD-10-CM | POA: Insufficient documentation

## 2020-08-25 DIAGNOSIS — K219 Gastro-esophageal reflux disease without esophagitis: Secondary | ICD-10-CM | POA: Diagnosis not present

## 2020-08-25 DIAGNOSIS — I714 Abdominal aortic aneurysm, without rupture: Secondary | ICD-10-CM | POA: Diagnosis not present

## 2020-08-25 DIAGNOSIS — Z79899 Other long term (current) drug therapy: Secondary | ICD-10-CM | POA: Insufficient documentation

## 2020-08-25 DIAGNOSIS — Z7982 Long term (current) use of aspirin: Secondary | ICD-10-CM | POA: Diagnosis not present

## 2020-08-25 DIAGNOSIS — R911 Solitary pulmonary nodule: Secondary | ICD-10-CM | POA: Insufficient documentation

## 2020-08-25 DIAGNOSIS — E119 Type 2 diabetes mellitus without complications: Secondary | ICD-10-CM | POA: Insufficient documentation

## 2020-08-25 DIAGNOSIS — I10 Essential (primary) hypertension: Secondary | ICD-10-CM | POA: Diagnosis not present

## 2020-08-25 DIAGNOSIS — Z87891 Personal history of nicotine dependence: Secondary | ICD-10-CM

## 2020-08-25 DIAGNOSIS — Z8249 Family history of ischemic heart disease and other diseases of the circulatory system: Secondary | ICD-10-CM | POA: Diagnosis not present

## 2020-08-25 DIAGNOSIS — Z85828 Personal history of other malignant neoplasm of skin: Secondary | ICD-10-CM | POA: Diagnosis not present

## 2020-08-25 DIAGNOSIS — R918 Other nonspecific abnormal finding of lung field: Secondary | ICD-10-CM

## 2020-08-25 NOTE — Progress Notes (Signed)
  Oncology Nurse Navigator Documentation  Navigator Location: [facility] (08/25/20 1000)   )Navigator Encounter Type: Clinic/MDC (08/25/20 1000)     Confirmed Diagnosis Date: 08/24/20 (08/25/20 1000)         Multidisiplinary Clinic Date: 08/25/20 (08/25/20 1000) Multidisiplinary Clinic Type: Thoracic (08/25/20 1000)   Patient Visit Type: MedOnc;RadOnc (08/25/20 1000) Treatment Phase: Pre-Tx/Tx Discussion (08/25/20 1000) Barriers/Navigation Needs: Coordination of Care (08/25/20 1000)   Interventions: Coordination of Care (08/25/20 1000)   Coordination of Care: Appts (08/25/20 1000)        Acuity: Level 2-Minimal Needs (1-2 Barriers Identified) (08/25/20 1000)      met with patient during initial consult with Dr. Janese Banks and Dr. Baruch Gouty. All questions answered during visit. Reviewed upcoming appts. Contact info given and instructed to call with any further questions or needs. Pt verbalized understanding. Nothing further needed at this time.   Time Spent with Patient: 60 (08/25/20 1000)

## 2020-08-25 NOTE — Consult Note (Signed)
NEW PATIENT EVALUATION  Name: Christian Fields  MRN: [id number]  Date:   08/25/2020     DOB: 1956-07-15   This 64 y.o. male patient presents to the clinic for initial evaluation of stage I non-small cell lung cancer of the left upper lobe.  REFERRING PHYSICIAN: Remi Haggard, FNP  CHIEF COMPLAINT:  Chief Complaint  Patient presents with  . Lung Cancer    DIAGNOSIS: The encounter diagnosis was Mass of lung.   PREVIOUS INVESTIGATIONS:  CT scans PET CT scans reviewed Labs reviewed Clinical notes reviewed  HPI: Patient is a 64 year old male who was admitted to [facility] back in April for community-acquired pneumonia acute on chronic respiratory failure with hypoxia and pneumonia of both lower lobes.  As part of his work-up CT scan was performed showing incidental finding of a left upper lobe 1 cm mass suspicious for malignancy.  He had a repeat scan in August showing interval growth of a 1.4 cm left upper lobe pulmonary nodule suspicious for primary bronchogenic carcinoma.  No other other thoracic adenopathy was noted.  He has severe centrilobular emphysema.  PET scan was performed yesterday showing low-level FDG uptake within the spiculated pulmonary nodule left upper lobe concerning for bronchogenic carcinoma.  Patient was presented at tumor conference and biopsy either by CT-guided needle or bronchoscopy was deemed risky and difficult based on patient's comorbidities and pulmonary disease.  He is now referred to radiation oncology for consideration of treatment.  He is on nasal oxygen currently doing well specifically [place] cough hemoptysis or chest tightness.  PLANNED TREATMENT REGIMEN: SBRT  PAST MEDICAL HISTORY:  has a past medical history of Anemia, Anxiety, Arthritis, Asthma, Cancer ([place]), Chronic back pain, COPD (chronic obstructive pulmonary disease) ([place]), Depression, Diabetes mellitus ([place]), Dyspnea, GERD (gastroesophageal reflux disease), Gout, Gout, Headache, History of blood clots,  History of kidney stones, Hyperlipidemia, Hyperlipidemia, Hypertension, Kidney stones, Neuropathy, On home oxygen therapy, Pneumonia (06/2017), Sleep apnea, and Ulcer of foot ([place]).    PAST SURGICAL HISTORY:  Past Surgical History:  Procedure Laterality Date  . APPENDECTOMY    . DG FEET 2 VIEWS BILAT    . LIPOMA EXCISION Right 08/15/2017   Procedure: EXCISION TUMOR(CYST) FOOT;  Surgeon: Albertine Patricia, DPM;  Location: [facility] ORS;  Service: Podiatry;  Laterality: Right;  . OTHER SURGICAL HISTORY Bilateral Foot surgery    FAMILY HISTORY: family history includes Hypertension in his mother.  SOCIAL HISTORY:  reports that he has quit smoking. His smoking use included cigarettes. He started smoking about 3 years ago. He smoked 0.50 packs per day. He has never used smokeless tobacco. He reports current alcohol use of about 1.0 standard drink of alcohol per week. He reports that he does not use drugs.  ALLERGIES: Bee venom and Hydrocodone-acetaminophen  MEDICATIONS:  Current Outpatient Medications  Medication Sig Dispense Refill  . albuterol (VENTOLIN HFA) 108 (90 Base) MCG/ACT inhaler Inhale 2 puffs into the lungs every 6 (six) hours as needed for wheezing or shortness of breath.    [name] Kitchen alendronate (FOSAMAX) 70 MG tablet Take 70 mg by mouth once a week.     [name] Kitchen allopurinol (ZYLOPRIM) 300 MG tablet Take 600 mg by mouth 2 (two) times daily.    [name] Kitchen aspirin EC 81 MG tablet Take 1 tablet by mouth daily.    . buprenorphine-naloxone (SUBOXONE) 8-2 mg SUBL SL tablet Place 1 tablet under the tongue 3 (three) times daily.     . cholecalciferol (VITAMIN D) 1000 units tablet Take 1,000 Units  by mouth daily.    . clopidogrel (PLAVIX) 75 MG tablet Take 75 mg by mouth daily. (Patient not taking: Reported on 08/25/2020)    . DULoxetine (CYMBALTA) 30 MG capsule Take 1 capsule by mouth daily.    [name] Kitchen ezetimibe (ZETIA) 10 MG tablet Take 10 mg by mouth daily.    . ferrous sulfate 325 (65 FE) MG tablet Take 325 mg by mouth  daily with breakfast. (Patient not taking: Reported on 08/25/2020)    . fluticasone (FLONASE) 50 MCG/ACT nasal spray Place 2 sprays into both nostrils daily.  5  . gabapentin (NEURONTIN) 800 MG tablet Take 800 mg by mouth 4 (four) times daily.  0  . hydrochlorothiazide (HYDRODIURIL) 25 MG tablet Take 25 mg by mouth daily.     [name] Kitchen ipratropium (ATROVENT) 0.02 % nebulizer solution Inhale 2.5 mL (500 mcg total) by nebulization every 6 (six) hours.    [name] Kitchen KOMBIGLYZE XR 2.04-999 MG TB24 Take 1 tablet by mouth 2 (two) times daily.    . metoprolol succinate (TOPROL-XL) 50 MG 24 hr tablet Take 50 mg by mouth daily.    . pantoprazole (PROTONIX) 40 MG tablet Take 40 mg by mouth daily.  1  . simvastatin (ZOCOR) 40 MG tablet Take 1 tablet by mouth daily at 6 PM.    . TRELEGY ELLIPTA 100-62.5-25 MCG/INH AEPB Inhale 1 puff into the lungs daily.    [name] Kitchen VASCEPA 1 g capsule Take 2 g by mouth 2 (two) times daily. (Patient not taking: Reported on 08/25/2020)     No current facility-administered medications for this encounter.    ECOG PERFORMANCE STATUS:  1 - Symptomatic but completely ambulatory  REVIEW OF SYSTEMS: Patient has significant emphysema COPD on nasal oxygen. Patient denies any weight loss, fatigue, weakness, fever, chills or night sweats. Patient denies any loss of vision, blurred vision. Patient denies any ringing  of the ears or hearing loss. No irregular heartbeat. Patient denies heart murmur or history of fainting. Patient denies any chest pain or pain radiating to her upper extremities. Patient denies any shortness of breath, difficulty breathing at night, cough or hemoptysis. Patient denies any swelling in the lower legs. Patient denies any nausea vomiting, vomiting of blood, or coffee ground material in the vomitus. Patient denies any stomach pain. Patient states has had normal bowel movements no significant constipation or diarrhea. Patient denies any dysuria, hematuria or significant nocturia. Patient  denies any problems walking, swelling in the joints or loss of balance. Patient denies any skin changes, loss of hair or loss of weight. Patient denies any excessive worrying or anxiety or significant depression. Patient denies any problems with insomnia. Patient denies excessive thirst, polyuria, polydipsia. Patient denies any swollen glands, patient denies easy bruising or easy bleeding. Patient denies any recent infections, allergies or URI. Patient "s visual fields have not changed significantly in recent time.   PHYSICAL EXAM: There were no vitals taken for this visit. Patient is on nasal oxygen.  Well-developed well-nourished patient in NAD. HEENT reveals PERLA, EOMI, discs not visualized.  Oral cavity is clear. No oral mucosal lesions are identified. Neck is clear without evidence of cervical or supraclavicular adenopathy. Lungs are clear to A&P. Cardiac examination is essentially unremarkable with regular rate and rhythm without murmur rub or thrill. Abdomen is benign with no organomegaly or masses noted. Motor sensory and DTR levels are equal and symmetric in the upper and lower extremities. Cranial nerves II through XII are grossly intact. Proprioception is intact. No peripheral adenopathy  or edema is identified. No motor or sensory levels are noted. Crude visual fields are within normal range.  LABORATORY DATA: Labs reviewed    RADIOLOGY RESULTS: CT scans and PET CT scans in serial fashion reviewed and compatible with above-stated findings   IMPRESSION: Stage I non-small cell lung cancer of the left upper lobe in 64 year old male  PLAN: At this time I would offer SBRT treatment 60 Gy in 5 fractions without tissue diagnosis.  Based on the growth of the lesion over time as well as spiculated appearance and slight hypermetabolic activity on PET CT scan believe this is a bronc primary bronchogenic carcinoma stage I.  Risks and benefits of treatment including possible worsening of cough fatigue  and some residual distortion of his PET lung parenchyma all were discussed in detail with the patient.  He comprehends my treatment plan well and is excepted treatment.  I have personally set up and ordered CT simulation for next week we will use 4-dimensional treatment planning as well as motion restriction during simulation.  Patient comprehends my treatment plan well.  I would like to take this opportunity to thank you for allowing me to participate in the care of your patient.Noreene Filbert, MD

## 2020-08-29 NOTE — Progress Notes (Signed)
Hematology/Oncology Consult note [facility] [phone] [phone]  Patient Care Team: Remi Haggard, FNP as PCP - General (Family Medicine) Telford Nab, RN as Oncology Nurse Navigator   Name of the patient: Christian Fields  [id number]  [birth year]    Reason for referral- lung nodule   Referring physician- Dr. Raul Del  Date of visit: 08/29/20   History of presenting illness- Patient is a 64 year old male who was admitted to the hospital in April 2021 for community-acquired pneumonia and as a part of the work-up he had a CT angio chest done which incidentally showed a 1 cm irregular left upper lobe lung nodule repeat CT scan in August 2021 showed an interval increase in the size from 1 to 1.4 cm.  No appreciable thoracic adenopathy.  Severe centrilobular emphysema.  Chronic pulmonary hypertension.  Patient has been seen by Dr. Raul Del and has been considered to be high risk for biopsy from their standpoint.  Patient will also be seeing radiation oncology for consideration of  radiation therapy.  Patient reports baseline fatigue and exertional shortness of breath.  Denies other complaints at this time  ECOG PS- 1  Pain scale- 0   Review of systems- Review of Systems  Constitutional: Positive for malaise/fatigue. Negative for chills, fever and weight loss.  HENT: Negative for congestion, ear discharge and nosebleeds.   Eyes: Negative for blurred vision.  Respiratory: Positive for shortness of breath. Negative for cough, hemoptysis, sputum production and wheezing.   Cardiovascular: Negative for chest pain, palpitations, orthopnea and claudication.  Gastrointestinal: Negative for abdominal pain, blood in stool, constipation, diarrhea, heartburn, melena, nausea and vomiting.  Genitourinary: Negative for dysuria, flank pain, frequency, hematuria and urgency.  Musculoskeletal: Negative for back pain, joint pain and myalgias.  Skin: Negative  for rash.  Neurological: Negative for dizziness, tingling, focal weakness, seizures, weakness and headaches.  Endo/Heme/Allergies: Does not bruise/bleed easily.  Psychiatric/Behavioral: Negative for depression and suicidal ideas. The patient does not have insomnia.     Allergies  Allergen Reactions  . Bee Venom Anaphylaxis  . Hydrocodone-Acetaminophen Itching    Patient Active Problem List   Diagnosis Date Noted  . Confusion   . Acute on chronic respiratory failure with hypoxia ([place]) 03/26/2020  . Pneumonia of both lower lobes due to infectious organism 03/25/2020  . Arthritis 10/29/2019  . Hemorrhoids 10/29/2019  . Senile nuclear sclerosis, bilateral 02/15/2019  . Cellulitis of left upper limb 02/11/2019  . AAA (abdominal aortic aneurysm) without rupture ([place]) 01/15/2019  . Localized swelling, mass and lump, upper limb 12/24/2018  . Pain in femur 12/24/2018  . TIA (transient ischemic attack) 11/18/2018  . Cortical age-related cataract of both eyes 11/02/2018  . Fracture of multiple ribs 04/20/2018  . Overweight (BMI 25.0-29.9) 04/20/2018  . Hypertension 07/08/2017  . Diabetes mellitus ([place]) 07/08/2017  . Hyperlipidemia 07/08/2017  . Tobacco use disorder 07/08/2017  . Atherosclerosis of native arteries of extremity with intermittent claudication ([place]) 07/08/2017  . SOB (shortness of breath) 06/19/2017  . Acute on chronic respiratory failure with hypoxia and hypercapnia ([place]) 12/15/2016  . CAP (community acquired pneumonia) 12/15/2016  . Chronic pain 12/15/2016  . Chronically on opiate therapy 12/15/2016  . History of kidney stones 12/15/2016  . Polypharmacy 12/15/2016  . Somnolence 12/15/2016  . Foot ulcer ([place]) 01/19/2016  . 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 Other psychoactive substance use, unspecified with withdrawal, uncomplicated ([place]) 42/35/3614  . Type 2 diabetes mellitus with foot ulcer (CODE) ([place]) 11/27/2015  .  Chronic obstructive  pulmonary disease, unspecified ([place]) 01/18/2013  . Depressive disorder 01/18/2013  . Hereditary and idiopathic peripheral neuropathy 01/18/2013  . Sleep apnea 01/18/2013  . Low back pain 11/23/2010  . Acute gouty arthropathy 07/10/2010  . Problem related to lifestyle 10/29/2004     Past Medical History:  Diagnosis Date  . Anemia   . Anxiety   . Arthritis   . Asthma   . Cancer ([place])    Basal Cell Skin Cancer  . Chronic back pain   . COPD (chronic obstructive pulmonary disease) ([place])   . Depression   . Diabetes mellitus ([place])   . Dyspnea   . GERD (gastroesophageal reflux disease)   . Gout   . Gout   . Headache   . History of blood clots    Left Leg--July 2018  . History of kidney stones   . Hyperlipidemia   . Hyperlipidemia   . Hypertension   . Kidney stones   . Neuropathy   . On home oxygen therapy    2 L / M  . Pneumonia 06/2017  . Sleep apnea   . Ulcer of foot ([place])    Right     Past Surgical History:  Procedure Laterality Date  . APPENDECTOMY    . DG FEET 2 VIEWS BILAT    . LIPOMA EXCISION Right 08/15/2017   Procedure: EXCISION TUMOR(CYST) FOOT;  Surgeon: Albertine Patricia, DPM;  Location: [facility] ORS;  Service: Podiatry;  Laterality: Right;  . OTHER SURGICAL HISTORY Bilateral Foot surgery    Social History   Socioeconomic History  . Marital status: Widowed    Spouse name: Not on file  . Number of children: Not on file  . Years of education: Not on file  . Highest education level: Not on file  Occupational History  . Not on file  Tobacco Use  . Smoking status: Former Smoker    Packs/day: 0.50    Types: Cigarettes    Start date: 2018  . Smokeless tobacco: Never Used  Vaping Use  . Vaping Use: Never used  Substance and Sexual Activity  . Alcohol use: Yes    Alcohol/week: 1.0 standard drink    Types: 1 Cans of beer per week    Comment: occ  . Drug use: No  . Sexual activity: Not on file  Other Topics Concern  . Not on file  Social History  Narrative  . Not on file   Social Determinants of Health   Financial Resource Strain:   . Difficulty of Paying Living Expenses: Not on file  Food Insecurity:   . Worried About Charity fundraiser in the Last Year: Not on file  . Ran Out of Food in the Last Year: Not on file  Transportation Needs:   . Lack of Transportation (Medical): Not on file  . Lack of Transportation (Non-Medical): Not on file  Physical Activity:   . Days of Exercise per Week: Not on file  . Minutes of Exercise per Session: Not on file  Stress:   . Feeling of Stress : Not on file  Social Connections:   . Frequency of Communication with Friends and Family: Not on file  . Frequency of Social Gatherings with Friends and Family: Not on file  . Attends Religious Services: Not on file  . Active Member of Clubs or Organizations: Not on file  . Attends Archivist Meetings: Not on file  . Marital Status: Not on file  Intimate Partner Violence:   .  Fear of Current or Ex-Partner: Not on file  . Emotionally Abused: Not on file  . Physically Abused: Not on file  . Sexually Abused: Not on file     Family History  Problem Relation Age of Onset  . Hypertension Mother      Current Outpatient Medications:  .  albuterol (VENTOLIN HFA) 108 (90 Base) MCG/ACT inhaler, Inhale 2 puffs into the lungs every 6 (six) hours as needed for wheezing or shortness of breath., Disp: , Rfl:  .  alendronate (FOSAMAX) 70 MG tablet, Take 70 mg by mouth once a week. , Disp: , Rfl:  .  allopurinol (ZYLOPRIM) 300 MG tablet, Take 600 mg by mouth 2 (two) times daily., Disp: , Rfl:  .  aspirin EC 81 MG tablet, Take 1 tablet by mouth daily., Disp: , Rfl:  .  buprenorphine-naloxone (SUBOXONE) 8-2 mg SUBL SL tablet, Place 1 tablet under the tongue 3 (three) times daily. , Disp: , Rfl:  .  cholecalciferol (VITAMIN D) 1000 units tablet, Take 1,000 Units by mouth daily., Disp: , Rfl:  .  DULoxetine (CYMBALTA) 30 MG capsule, Take 1 capsule by  mouth daily., Disp: , Rfl:  .  ezetimibe (ZETIA) 10 MG tablet, Take 10 mg by mouth daily., Disp: , Rfl:  .  fluticasone (FLONASE) 50 MCG/ACT nasal spray, Place 2 sprays into both nostrils daily., Disp: , Rfl: 5 .  gabapentin (NEURONTIN) 800 MG tablet, Take 800 mg by mouth 4 (four) times daily., Disp: , Rfl: 0 .  hydrochlorothiazide (HYDRODIURIL) 25 MG tablet, Take 25 mg by mouth daily. , Disp: , Rfl:  .  KOMBIGLYZE XR 2.04-999 MG TB24, Take 1 tablet by mouth 2 (two) times daily., Disp: , Rfl:  .  metoprolol succinate (TOPROL-XL) 50 MG 24 hr tablet, Take 50 mg by mouth daily., Disp: , Rfl:  .  pantoprazole (PROTONIX) 40 MG tablet, Take 40 mg by mouth daily., Disp: , Rfl: 1 .  simvastatin (ZOCOR) 40 MG tablet, Take 1 tablet by mouth daily at 6 PM., Disp: , Rfl:  .  TRELEGY ELLIPTA 100-62.5-25 MCG/INH AEPB, Inhale 1 puff into the lungs daily., Disp: , Rfl:  .  clopidogrel (PLAVIX) 75 MG tablet, Take 75 mg by mouth daily. (Patient not taking: Reported on 08/25/2020), Disp: , Rfl:  .  ferrous sulfate 325 (65 FE) MG tablet, Take 325 mg by mouth daily with breakfast. (Patient not taking: Reported on 08/25/2020), Disp: , Rfl:  .  ipratropium (ATROVENT) 0.02 % nebulizer solution, Inhale 2.5 mL (500 mcg total) by nebulization every 6 (six) hours., Disp: , Rfl:  .  VASCEPA 1 g capsule, Take 2 g by mouth 2 (two) times daily. (Patient not taking: Reported on 08/25/2020), Disp: , Rfl:    Physical exam:  Vitals:   08/25/20 0933  BP: 135/83  Pulse: 98  Resp: 16  Temp: (!) 96.2 F (35.7 C)  TempSrc: Tympanic  SpO2: 99%  Weight: 174 lb 6.4 oz (79.1 kg)   Physical Exam Cardiovascular:     Rate and Rhythm: Normal rate and regular rhythm.     Heart sounds: Normal heart sounds.  Pulmonary:     Effort: Pulmonary effort is normal.     Comments: Breath sounds decreased bilaterally diffusely Abdominal:     General: Bowel sounds are normal.     Palpations: Abdomen is soft.  Skin:    General: Skin is warm  and dry.  Neurological:     Mental Status: He is alert and  oriented to person, place, and time.        CMP Latest Ref Rng & Units 03/28/2020  Glucose 70 - 99 mg/dL 134(H)  BUN 8 - 23 mg/dL 15  Creatinine 0.61 - 1.24 mg/dL 1.05  Sodium 135 - 145 mmol/L 140  Potassium 3.5 - 5.1 mmol/L 3.6  Chloride 98 - 111 mmol/L 94(L)  CO2 22 - 32 mmol/L 32  Calcium 8.9 - 10.3 mg/dL 8.1(L)  Total Protein 6.5 - 8.1 g/dL -  Total Bilirubin 0.3 - 1.2 mg/dL -  Alkaline Phos 38 - 126 U/L -  AST 15 - 41 U/L -  ALT 0 - 44 U/L -   CBC Latest Ref Rng & Units 03/28/2020  WBC 4.0 - 10.5 K/uL 12.3(H)  Hemoglobin 13.0 - 17.0 g/dL 9.8(L)  Hematocrit 39 - 52 % 31.7(L)  Platelets 150 - 400 K/uL 394    No images are attached to the encounter.  CT CHEST WO CONTRAST  Result Date: 08/08/2020 CLINICAL DATA:  Follow-up pulmonary nodule. Dyspnea. History of COVID-19 in February. Former smoker. EXAM: CT CHEST WITHOUT CONTRAST TECHNIQUE: Multidetector CT imaging of the chest was performed following the standard protocol without IV contrast. COMPARISON:  03/26/2020 chest CT angiogram. FINDINGS: Cardiovascular: Normal heart size. No significant pericardial effusion/thickening. Left anterior descending and left circumflex coronary atherosclerosis. Atherosclerotic nonaneurysmal thoracic aorta. Stable dilated main pulmonary artery (3.8 cm diameter). Mediastinum/Nodes: No discrete thyroid nodules. Unremarkable esophagus. No pathologically enlarged axillary, mediastinal or hilar lymph nodes, noting limited sensitivity for the detection of hilar adenopathy on this noncontrast study. Previously described bilateral hilar adenopathy appears to have resolved on this scan limited by noncontrast technique. Lungs/Pleura: No pneumothorax. No pleural effusion. Severe centrilobular emphysema with mild diffuse bronchial wall thickening. Peripheral left upper lobe 1.4 x 1.1 cm irregular solid pulmonary nodule (series 3/image 56), increased from  1.0 x 0.9 cm. Two additional tiny solid scattered right pulmonary nodules, largest 0.3 cm in the peripheral right middle lobe (series 3/image 80), both stable. No acute consolidative airspace disease. Previously visualized extensive patchy bilateral lower lobe consolidation has resolved with residual mildly thick and irregular curvilinear parenchymal band in the dependent left lower lobe. Upper abdomen: No acute abnormality. Musculoskeletal: No aggressive appearing focal osseous lesions. Mild thoracic spondylosis. Healed mild sternal deformity. IMPRESSION: 1. Interval growth of irregular solid 1.4 cm left upper lobe pulmonary nodule, suspicious for primary bronchogenic carcinoma. Multidisciplinary thoracic oncology consultation and PET-CT suggested for further characterization. 2. No appreciable thoracic adenopathy. Previously visualized bilateral hilar adenopathy appears to have resolved on this scan limited by noncontrast technique. 3. Severe centrilobular emphysema with mild diffuse bronchial wall thickening, suggesting COPD. 4. Stable dilated main pulmonary artery, suggesting chronic pulmonary arterial hypertension. 5. Two-vessel coronary atherosclerosis. 6. Aortic Atherosclerosis (ICD10-I70.0) and Emphysema (ICD10-J43.9). These results will be called to the ordering clinician or representative by the Radiologist Assistant, and communication documented in the PACS or [employer]. Electronically Signed   By: Ilona Sorrel M.D.   On: 08/08/2020 17:06   NM PET Image Initial (PI) Skull Base To Thigh  Result Date: 08/24/2020 CLINICAL DATA:  Initial treatment strategy for pulmonary nodule. EXAM: NUCLEAR MEDICINE PET SKULL BASE TO THIGH TECHNIQUE: 9.52 mCi F-18 FDG was injected intravenously. Full-ring PET imaging was performed from the skull base to thigh after the radiotracer. CT data was obtained and used for attenuation correction and anatomic localization. Fasting blood glucose: 118 mg/dl COMPARISON:   Chest CT of August 08, 2020 FINDINGS: Mediastinal blood pool activity:  SUV max 1.92 Liver activity: SUV max 2.88 NECK: No hypermetabolic lymph nodes in the neck. Incidental CT findings: none CHEST: No hypermetabolic lymph nodes in the chest or nodes with pathologic enlargement. Pulmonary nodule, area of concern in the LEFT chest with very low level FDG uptake equal to that of blood pool activity. Size grossly unchanged though with limited assessment due to respiratory motion on the current study, as compared to the recent prior. Incidental CT findings: Calcified atheromatous plaque of the thoracic aorta without aneurysm. Normal heart size without pericardial effusion. Central pulmonary vasculature with mild engorgement. No consolidation. No pleural effusion. Pulmonary emphysema. Pulmonary nodule seen on recent chest CT as above. Limited assessment of cardiovascular structures given lack of intravenous contrast. ABDOMEN/PELVIS: No abnormal hypermetabolic activity within the liver, pancreas, adrenal glands, or spleen. No hypermetabolic lymph nodes in the abdomen or pelvis. Incidental CT findings: Liver contour is normal. No pericholecystic stranding. Spleen, pancreas and adrenal glands are unremarkable. Small low-density lesion arises from posterior RIGHT kidney likely a small cyst. No hydronephrosis. No acute gastrointestinal process. Calcified atheromatous plaque of the abdominal aorta with mild aneurysmal dilation of the infrarenal segment to 3.5 x 3.6 cm SKELETON: No focal hypermetabolic activity to suggest skeletal metastasis. Incidental CT findings: Spinal degenerative changes. No acute bone findings. Signs of prior trauma to the LEFT hemithorax with healed rib fractures about the LEFT chest similar to the previous study. IMPRESSION: Low level FDG uptake within a spiculated pulmonary nodule in the LEFT upper lobe which is grossly unchanged with respect to size though with limited assessment due to motion  artifact. Findings remain concerning for bronchogenic neoplasm, perhaps indolent given the low level uptake and not associated with nodal disease in the chest. Calcified atheromatous plaque in the abdominal aorta with mild aneurysmal dilation. Recommend follow-up every 2 years. This recommendation follows ACR consensus guidelines: White Paper of the ACR Incidental Findings Committee II on Vascular Findings. J Am Coll Radiol 2013; 10:789-794. Pulmonary emphysema and coronary artery disease. Aortic Atherosclerosis (ICD10-I70.0) and Emphysema (ICD10-J43.9). Aortic aneurysm NOS (ICD10-I71.9). Electronically Signed   By: Zetta Bills M.D.   On: 08/24/2020 17:17    Assessment and plan- Patient is a 64 y.o. male with enlarging left upper lobe lung nodule concerning for malignancy  I have reviewed PET/CT scan images independently and discussed findings with the patient.Patient noted to have an enlarging left upper lobe 1 cm nodule which does not have appreciable PET CT uptake.  No evidence of locoregional adenopathy.  Concern for lung cancer remains.  He is high risk for biopsy.  We will discuss his case at tumor board to see if Dr. Donella Stade would be willing to radiate the lesion empirically.  Follow-up with me to be decided based on tumor board discussion.  No role for systemic chemotherapy at this time   Thank you for this kind referral and the opportunity to participate in the care of this patient   Visit Diagnosis 1. Nodule of upper lobe of left lung     Dr. Randa Evens, MD, MPH [facility] [phone] 08/29/2020 8:43 AM

## 2020-08-30 ENCOUNTER — Ambulatory Visit: Payer: Medicare HMO | Attending: Radiation Oncology

## 2020-08-30 DIAGNOSIS — C3412 Malignant neoplasm of upper lobe, left bronchus or lung: Secondary | ICD-10-CM | POA: Diagnosis present

## 2020-08-30 DIAGNOSIS — Z87891 Personal history of nicotine dependence: Secondary | ICD-10-CM | POA: Insufficient documentation

## 2020-08-30 DIAGNOSIS — Z510 Encounter for antineoplastic radiation therapy: Secondary | ICD-10-CM | POA: Diagnosis present

## 2020-08-31 ENCOUNTER — Other Ambulatory Visit: Payer: Medicare HMO

## 2020-09-01 NOTE — Progress Notes (Signed)
Tumor Board Documentation  Christian Fields was presented by Dr Janese Banks at our Tumor Board on 08/31/2020, which included representatives from medical oncology, radiation oncology, surgical oncology, internal medicine, navigation, pathology, surgical, pharmacy, research, palliative care.  Christian Fields currently presents as a new patient, for discussion with history of the following treatments: active survellience.  Additionally, we reviewed previous medical and familial history, history of present illness, and recent lab results along with all available histopathologic and imaging studies. The tumor board considered available treatment options and made the following recommendations: Radiation therapy (primary modality) Patient is not a surgical candidate  The following procedures/referrals were also placed: No orders of the defined types were placed in this encounter.   Clinical Trial Status: not discussed   Staging used:    National site-specific guidelines   were discussed with respect to the case.  Tumor board is a meeting of clinicians from various specialty areas who evaluate and discuss patients for whom a multidisciplinary approach is being considered. Final determinations in the plan of care are those of the provider(s). The responsibility for follow up of recommendations given during tumor board is that of the provider.   Today's extended care, comprehensive team conference, Christian Fields was not present for the discussion and was not examined.   Multidisciplinary Tumor Board is a multidisciplinary case peer review process.  Decisions discussed in the Multidisciplinary Tumor Board reflect the opinions of the specialists present at the conference without having examined the patient.  Ultimately, treatment and diagnostic decisions rest with the primary provider(s) and the patient.

## 2020-09-06 DIAGNOSIS — Z510 Encounter for antineoplastic radiation therapy: Secondary | ICD-10-CM | POA: Diagnosis not present

## 2020-09-12 ENCOUNTER — Ambulatory Visit: Payer: Medicare HMO

## 2020-09-13 ENCOUNTER — Ambulatory Visit: Payer: Medicare HMO

## 2020-09-14 ENCOUNTER — Ambulatory Visit: Payer: Medicare HMO

## 2020-09-15 ENCOUNTER — Ambulatory Visit: Payer: Medicare HMO

## 2020-09-15 DIAGNOSIS — Z87891 Personal history of nicotine dependence: Secondary | ICD-10-CM | POA: Insufficient documentation

## 2020-09-15 DIAGNOSIS — C3412 Malignant neoplasm of upper lobe, left bronchus or lung: Secondary | ICD-10-CM | POA: Insufficient documentation

## 2020-09-15 DIAGNOSIS — Z510 Encounter for antineoplastic radiation therapy: Secondary | ICD-10-CM | POA: Insufficient documentation

## 2020-09-19 ENCOUNTER — Ambulatory Visit
Admission: RE | Admit: 2020-09-19 | Discharge: 2020-09-19 | Disposition: A | Discharge status: Home / Self Care | Payer: Medicare HMO | Source: Ambulatory Visit | Attending: Radiation Oncology | Admitting: Radiation Oncology

## 2020-09-19 DIAGNOSIS — C3412 Malignant neoplasm of upper lobe, left bronchus or lung: Secondary | ICD-10-CM | POA: Diagnosis present

## 2020-09-19 DIAGNOSIS — Z87891 Personal history of nicotine dependence: Secondary | ICD-10-CM | POA: Diagnosis not present

## 2020-09-19 DIAGNOSIS — Z510 Encounter for antineoplastic radiation therapy: Secondary | ICD-10-CM | POA: Diagnosis present

## 2020-09-21 ENCOUNTER — Ambulatory Visit
Admission: RE | Admit: 2020-09-21 | Discharge: 2020-09-21 | Disposition: A | Discharge status: Home / Self Care | Payer: Medicare HMO | Source: Ambulatory Visit | Attending: Radiation Oncology | Admitting: Radiation Oncology

## 2020-09-21 DIAGNOSIS — Z510 Encounter for antineoplastic radiation therapy: Secondary | ICD-10-CM | POA: Diagnosis not present

## 2020-09-26 ENCOUNTER — Ambulatory Visit: Payer: Medicare HMO

## 2020-09-26 ENCOUNTER — Encounter: Payer: Self-pay | Admitting: *Deleted

## 2020-09-26 ENCOUNTER — Ambulatory Visit
Admission: RE | Admit: 2020-09-26 | Discharge: 2020-09-26 | Disposition: A | Discharge status: Home / Self Care | Payer: Medicare HMO | Source: Ambulatory Visit | Attending: Radiation Oncology | Admitting: Radiation Oncology

## 2020-09-26 DIAGNOSIS — R911 Solitary pulmonary nodule: Secondary | ICD-10-CM

## 2020-09-26 DIAGNOSIS — Z510 Encounter for antineoplastic radiation therapy: Secondary | ICD-10-CM | POA: Diagnosis not present

## 2020-09-26 NOTE — Progress Notes (Signed)
  Oncology Nurse Navigator Documentation  Navigator Location: [facility] (09/26/20 0900)   )Navigator Encounter Type: Appt/Treatment Plan Review (09/26/20 0900)                       Treatment Phase: Active Tx (09/26/20 0900) Barriers/Navigation Needs: No Barriers At This Time;No Needs (09/26/20 0900)   Interventions: None Required (09/26/20 0900)                      Time Spent with Patient: 15 (09/26/20 0900)

## 2020-09-27 NOTE — Addendum Note (Signed)
Addended by: Telford Nab on: 09/27/2020 09:35 AM   Modules accepted: Orders

## 2020-09-28 ENCOUNTER — Telehealth: Payer: Self-pay | Admitting: Oncology

## 2020-09-28 ENCOUNTER — Ambulatory Visit: Payer: Medicare HMO

## 2020-09-28 ENCOUNTER — Ambulatory Visit
Admission: RE | Admit: 2020-09-28 | Discharge: 2020-09-28 | Disposition: A | Discharge status: Home / Self Care | Payer: Medicare HMO | Source: Ambulatory Visit | Attending: Radiation Oncology | Admitting: Radiation Oncology

## 2020-09-28 DIAGNOSIS — Z510 Encounter for antineoplastic radiation therapy: Secondary | ICD-10-CM | POA: Diagnosis not present

## 2020-09-28 NOTE — Telephone Encounter (Signed)
09/28/2020  Called pt and could not reach them. Left a VM informing them of the their upcoming scan on 11/20/20 and follow up appt on 11/21/20 per Dr. Elroy Channel request. Left a call back number in case they had any questions  SRW

## 2020-10-02 ENCOUNTER — Telehealth: Payer: Self-pay | Admitting: *Deleted

## 2020-10-03 ENCOUNTER — Ambulatory Visit
Admission: RE | Admit: 2020-10-03 | Discharge: 2020-10-03 | Disposition: A | Discharge status: Home / Self Care | Payer: Medicare HMO | Source: Ambulatory Visit | Attending: Radiation Oncology | Admitting: Radiation Oncology

## 2020-10-03 DIAGNOSIS — Z510 Encounter for antineoplastic radiation therapy: Secondary | ICD-10-CM | POA: Diagnosis not present

## 2020-10-04 ENCOUNTER — Telehealth: Payer: Self-pay | Admitting: Oncology

## 2020-10-04 NOTE — Telephone Encounter (Signed)
10/04/2020  Left VM informing pt of new appt per Dr. Janese Banks on 10/19/20 @ 2:15. Left call back number and instructed pt to call back if he had any questions or concerns  SRW

## 2020-10-10 ENCOUNTER — Ambulatory Visit: Payer: Medicare HMO | Admitting: Oncology

## 2020-10-19 ENCOUNTER — Telehealth: Payer: Self-pay | Admitting: Oncology

## 2020-10-19 ENCOUNTER — Inpatient Hospital Stay: Payer: Medicare HMO | Attending: Oncology | Admitting: Oncology

## 2020-10-19 DIAGNOSIS — Z79899 Other long term (current) drug therapy: Secondary | ICD-10-CM | POA: Insufficient documentation

## 2020-10-19 DIAGNOSIS — Z9049 Acquired absence of other specified parts of digestive tract: Secondary | ICD-10-CM | POA: Insufficient documentation

## 2020-10-19 DIAGNOSIS — R5383 Other fatigue: Secondary | ICD-10-CM | POA: Insufficient documentation

## 2020-10-19 DIAGNOSIS — Z885 Allergy status to narcotic agent status: Secondary | ICD-10-CM | POA: Insufficient documentation

## 2020-10-19 DIAGNOSIS — Z85828 Personal history of other malignant neoplasm of skin: Secondary | ICD-10-CM | POA: Insufficient documentation

## 2020-10-19 DIAGNOSIS — R0602 Shortness of breath: Secondary | ICD-10-CM | POA: Insufficient documentation

## 2020-10-19 DIAGNOSIS — M1990 Unspecified osteoarthritis, unspecified site: Secondary | ICD-10-CM | POA: Insufficient documentation

## 2020-10-19 DIAGNOSIS — Z87442 Personal history of urinary calculi: Secondary | ICD-10-CM | POA: Insufficient documentation

## 2020-10-19 DIAGNOSIS — J432 Centrilobular emphysema: Secondary | ICD-10-CM | POA: Insufficient documentation

## 2020-10-19 DIAGNOSIS — I272 Pulmonary hypertension, unspecified: Secondary | ICD-10-CM | POA: Insufficient documentation

## 2020-10-19 DIAGNOSIS — C3412 Malignant neoplasm of upper lobe, left bronchus or lung: Secondary | ICD-10-CM | POA: Insufficient documentation

## 2020-10-19 DIAGNOSIS — Z8249 Family history of ischemic heart disease and other diseases of the circulatory system: Secondary | ICD-10-CM | POA: Insufficient documentation

## 2020-10-19 DIAGNOSIS — D649 Anemia, unspecified: Secondary | ICD-10-CM | POA: Insufficient documentation

## 2020-10-19 DIAGNOSIS — Z87891 Personal history of nicotine dependence: Secondary | ICD-10-CM | POA: Insufficient documentation

## 2020-10-19 DIAGNOSIS — Z86718 Personal history of other venous thrombosis and embolism: Secondary | ICD-10-CM | POA: Insufficient documentation

## 2020-10-19 NOTE — Telephone Encounter (Signed)
10/19/2020 Called pt and left VM regarding missed appt today. Pt had called earlier about cxl the appt. Left call back number and informed pt to reach out about r/s appt  SRW

## 2020-10-20 ENCOUNTER — Telehealth: Payer: Self-pay

## 2020-10-20 NOTE — Telephone Encounter (Signed)
10/20/2020 Called pt to see if we could get him rescheduled and to also let him know of upcoming appointments. Stated I would mail appointments to him.

## 2020-10-20 NOTE — Telephone Encounter (Signed)
Pt called back and was rescheduled, appointment mailed out today.

## 2020-10-24 ENCOUNTER — Inpatient Hospital Stay: Payer: Medicare HMO | Admitting: Oncology

## 2020-10-27 ENCOUNTER — Inpatient Hospital Stay: Payer: Medicare HMO

## 2020-10-27 ENCOUNTER — Inpatient Hospital Stay (HOSPITAL_BASED_OUTPATIENT_CLINIC_OR_DEPARTMENT_OTHER): Payer: Medicare HMO | Admitting: Oncology

## 2020-10-27 ENCOUNTER — Other Ambulatory Visit (INDEPENDENT_AMBULATORY_CARE_PROVIDER_SITE_OTHER): Payer: Self-pay | Admitting: Nurse Practitioner

## 2020-10-27 ENCOUNTER — Other Ambulatory Visit: Payer: Self-pay

## 2020-10-27 VITALS — BP 125/75 | HR 92 | Temp 98.6°F | Resp 18 | Wt 178.8 lb

## 2020-10-27 DIAGNOSIS — R5383 Other fatigue: Secondary | ICD-10-CM | POA: Diagnosis not present

## 2020-10-27 DIAGNOSIS — I70212 Atherosclerosis of native arteries of extremities with intermittent claudication, left leg: Secondary | ICD-10-CM

## 2020-10-27 DIAGNOSIS — Z87891 Personal history of nicotine dependence: Secondary | ICD-10-CM | POA: Diagnosis not present

## 2020-10-27 DIAGNOSIS — J432 Centrilobular emphysema: Secondary | ICD-10-CM | POA: Diagnosis not present

## 2020-10-27 DIAGNOSIS — M1990 Unspecified osteoarthritis, unspecified site: Secondary | ICD-10-CM | POA: Diagnosis not present

## 2020-10-27 DIAGNOSIS — Z8249 Family history of ischemic heart disease and other diseases of the circulatory system: Secondary | ICD-10-CM | POA: Diagnosis not present

## 2020-10-27 DIAGNOSIS — D649 Anemia, unspecified: Secondary | ICD-10-CM | POA: Diagnosis not present

## 2020-10-27 DIAGNOSIS — Z885 Allergy status to narcotic agent status: Secondary | ICD-10-CM | POA: Diagnosis not present

## 2020-10-27 DIAGNOSIS — I272 Pulmonary hypertension, unspecified: Secondary | ICD-10-CM | POA: Diagnosis not present

## 2020-10-27 DIAGNOSIS — Z9049 Acquired absence of other specified parts of digestive tract: Secondary | ICD-10-CM | POA: Diagnosis not present

## 2020-10-27 DIAGNOSIS — Z87442 Personal history of urinary calculi: Secondary | ICD-10-CM | POA: Diagnosis not present

## 2020-10-27 DIAGNOSIS — C3412 Malignant neoplasm of upper lobe, left bronchus or lung: Secondary | ICD-10-CM | POA: Diagnosis not present

## 2020-10-27 DIAGNOSIS — I714 Abdominal aortic aneurysm, without rupture, unspecified: Secondary | ICD-10-CM

## 2020-10-27 DIAGNOSIS — Z85828 Personal history of other malignant neoplasm of skin: Secondary | ICD-10-CM | POA: Diagnosis not present

## 2020-10-27 DIAGNOSIS — Z86718 Personal history of other venous thrombosis and embolism: Secondary | ICD-10-CM | POA: Diagnosis not present

## 2020-10-27 DIAGNOSIS — R0602 Shortness of breath: Secondary | ICD-10-CM | POA: Diagnosis not present

## 2020-10-27 DIAGNOSIS — Z79899 Other long term (current) drug therapy: Secondary | ICD-10-CM | POA: Diagnosis not present

## 2020-10-27 LAB — CBC WITH DIFFERENTIAL/PLATELET
Abs Immature Granulocytes: 0.09 10*3/uL — ABNORMAL HIGH (ref 0.00–0.07)
Basophils Absolute: 0.1 10*3/uL (ref 0.0–0.1)
Basophils Relative: 1 %
Eosinophils Absolute: 0.8 10*3/uL — ABNORMAL HIGH (ref 0.0–0.5)
Eosinophils Relative: 8 %
HCT: 38 % — ABNORMAL LOW (ref 39.0–52.0)
Hemoglobin: 11.6 g/dL — ABNORMAL LOW (ref 13.0–17.0)
Immature Granulocytes: 1 %
Lymphocytes Relative: 13 %
Lymphs Abs: 1.2 10*3/uL (ref 0.7–4.0)
MCH: 28.2 pg (ref 26.0–34.0)
MCHC: 30.5 g/dL (ref 30.0–36.0)
MCV: 92.5 fL (ref 80.0–100.0)
Monocytes Absolute: 0.5 10*3/uL (ref 0.1–1.0)
Monocytes Relative: 6 %
Neutro Abs: 6.6 10*3/uL (ref 1.7–7.7)
Neutrophils Relative %: 71 %
Platelets: 284 10*3/uL (ref 150–400)
RBC: 4.11 MIL/uL — ABNORMAL LOW (ref 4.22–5.81)
RDW: 14.8 % (ref 11.5–15.5)
WBC: 9.3 10*3/uL (ref 4.0–10.5)
nRBC: 0 % (ref 0.0–0.2)

## 2020-10-27 LAB — COMPREHENSIVE METABOLIC PANEL
ALT: 10 U/L (ref 0–44)
AST: 19 U/L (ref 15–41)
Albumin: 3.7 g/dL (ref 3.5–5.0)
Alkaline Phosphatase: 116 U/L (ref 38–126)
Anion gap: 15 (ref 5–15)
BUN: 18 mg/dL (ref 8–23)
CO2: 26 mmol/L (ref 22–32)
Calcium: 9.4 mg/dL (ref 8.9–10.3)
Chloride: 96 mmol/L — ABNORMAL LOW (ref 98–111)
Creatinine, Ser: 1.2 mg/dL (ref 0.61–1.24)
GFR, Estimated: >60 mL/min (ref >60)
Glucose, Bld: 105 mg/dL — ABNORMAL HIGH (ref 70–99)
Potassium: 3.7 mmol/L (ref 3.5–5.1)
Sodium: 137 mmol/L (ref 135–145)
Total Bilirubin: 0.4 mg/dL (ref 0.3–1.2)
Total Protein: 7.8 g/dL (ref 6.5–8.1)

## 2020-10-27 LAB — VITAMIN B12: Vitamin B-12: 166 pg/mL — ABNORMAL LOW (ref 180–914)

## 2020-10-27 LAB — IRON AND TIBC
Iron: 35 ug/dL — ABNORMAL LOW (ref 45–182)
Saturation Ratios: 8 % — ABNORMAL LOW (ref 17.9–39.5)
TIBC: 430 ug/dL (ref 250–450)
UIBC: 395 ug/dL

## 2020-10-27 LAB — TSH: TSH: 3.093 u[IU]/mL (ref 0.350–4.500)

## 2020-10-27 LAB — LACTATE DEHYDROGENASE: LDH: 136 U/L (ref 98–192)

## 2020-10-27 LAB — RETICULOCYTES
Immature Retic Fract: 19.9 % — ABNORMAL HIGH (ref 2.3–15.9)
RBC.: 4.03 MIL/uL — ABNORMAL LOW (ref 4.22–5.81)
Retic Count, Absolute: 59.6 10*3/uL (ref 19.0–186.0)
Retic Ct Pct: 1.5 % (ref 0.4–3.1)

## 2020-10-27 LAB — FERRITIN: Ferritin: 28 ng/mL (ref 24–336)

## 2020-10-27 LAB — FOLATE: Folate: 16.1 ng/mL (ref >5.9)

## 2020-10-28 LAB — HAPTOGLOBIN: Haptoglobin: 322 mg/dL (ref 32–363)

## 2020-10-30 LAB — MULTIPLE MYELOMA PANEL, SERUM
Albumin SerPl Elph-Mcnc: 3.4 g/dL (ref 2.9–4.4)
Albumin/Glob SerPl: 1 (ref 0.7–1.7)
Alpha 1: 0.3 g/dL (ref 0.0–0.4)
Alpha2 Glob SerPl Elph-Mcnc: 1 g/dL (ref 0.4–1.0)
B-Globulin SerPl Elph-Mcnc: 1.2 g/dL (ref 0.7–1.3)
Gamma Glob SerPl Elph-Mcnc: 1.1 g/dL (ref 0.4–1.8)
Globulin, Total: 3.6 g/dL (ref 2.2–3.9)
IgA: 239 mg/dL (ref 61–437)
IgG (Immunoglobin G), Serum: 1052 mg/dL (ref 603–1613)
IgM (Immunoglobulin M), Srm: 34 mg/dL (ref 20–172)
Total Protein ELP: 7 g/dL (ref 6.0–8.5)

## 2020-10-30 LAB — KAPPA/LAMBDA LIGHT CHAINS
Kappa free light chain: 50.7 mg/L — ABNORMAL HIGH (ref 3.3–19.4)
Kappa, lambda light chain ratio: 1.54 (ref 0.26–1.65)
Lambda free light chains: 32.9 mg/L — ABNORMAL HIGH (ref 5.7–26.3)

## 2020-10-31 ENCOUNTER — Ambulatory Visit (INDEPENDENT_AMBULATORY_CARE_PROVIDER_SITE_OTHER): Payer: Medicare HMO | Admitting: Vascular Surgery

## 2020-10-31 ENCOUNTER — Encounter (INDEPENDENT_AMBULATORY_CARE_PROVIDER_SITE_OTHER): Payer: Medicare HMO

## 2020-10-31 ENCOUNTER — Other Ambulatory Visit (INDEPENDENT_AMBULATORY_CARE_PROVIDER_SITE_OTHER): Payer: Medicare HMO

## 2020-10-31 ENCOUNTER — Encounter: Payer: Self-pay | Admitting: Oncology

## 2020-10-31 ENCOUNTER — Encounter (INDEPENDENT_AMBULATORY_CARE_PROVIDER_SITE_OTHER): Payer: Self-pay

## 2020-10-31 NOTE — Progress Notes (Signed)
Hematology/Oncology Consult note [facility]  [phone] [phone]  Patient Care Team: Remi Haggard, FNP as PCP - General (Family Medicine) Telford Nab, RN as Oncology Nurse Navigator   Name of the patient: Christian Fields  [id number]  September 17, 1956   Date of visit: 10/31/20  Diagnosis-stage I lung cancer s/p radiation Normocytic anemia  Chief complaint/ Reason for visit-history of lung cancer now referred for anemia  Heme/Onc history: Patient is a 64 year old male who was admitted to the hospital in April 2021 for community-acquired pneumonia and as a part of the work-up he had a CT angio chest done which incidentally showed a 1 cm irregular left upper lobe lung nodule repeat CT scan in August 2021 showed an interval increase in the size from 1 to 1.4 cm.  No appreciable thoracic adenopathy.  Severe centrilobular emphysema.  Chronic pulmonary hypertension.  Patient has been seen by Dr. Raul Del and has been considered to be high risk for biopsy from their standpoint.  He underwent SBRT to the left upper lobe lung lesion.  Patient now referred for anemia  Most recent labs from September 2021 showed a normal liver and kidney function.  TSH was normal.  Vitamin D levels were low.  CBC showed white count of 12.6, H&H of 10.9/37 with an MCV of 102 and a platelet count of 358.   Interval history-patient reports ongoing fatigue from radiation treatment.  He has baseline exertional shortness of breath and is on home oxygen.  ECOG PS- 2 Pain scale- 3 Opioid associated constipation- no  Review of systems- Review of Systems  Constitutional: Positive for malaise/fatigue. Negative for chills, fever and weight loss.  HENT: Negative for congestion, ear discharge and nosebleeds.   Eyes: Negative for blurred vision.  Respiratory: Positive for shortness of breath. Negative for cough, hemoptysis, sputum production and wheezing.   Cardiovascular:  Negative for chest pain, palpitations, orthopnea and claudication.  Gastrointestinal: Negative for abdominal pain, blood in stool, constipation, diarrhea, heartburn, melena, nausea and vomiting.  Genitourinary: Negative for dysuria, flank pain, frequency, hematuria and urgency.  Musculoskeletal: Negative for back pain, joint pain and myalgias.  Skin: Negative for rash.  Neurological: Negative for dizziness, tingling, focal weakness, seizures, weakness and headaches.  Endo/Heme/Allergies: Does not bruise/bleed easily.  Psychiatric/Behavioral: Negative for depression and suicidal ideas. The patient does not have insomnia.        Allergies  Allergen Reactions  . Bee Venom Anaphylaxis  . Hydrocodone-Acetaminophen Itching     Past Medical History:  Diagnosis Date  . Anemia   . Anxiety   . Arthritis   . Asthma   . Cancer ([place])    Basal Cell Skin Cancer  . Chronic back pain   . COPD (chronic obstructive pulmonary disease) ([place])   . Depression   . Diabetes mellitus ([place])   . Dyspnea   . GERD (gastroesophageal reflux disease)   . Gout   . Gout   . Headache   . History of blood clots    Left Leg--July 2018  . History of kidney stones   . Hyperlipidemia   . Hyperlipidemia   . Hypertension   . Kidney stones   . Neuropathy   . On home oxygen therapy    2 L / M  . Pneumonia 06/2017  . Sleep apnea   . Ulcer of foot ([place])    Right     Past Surgical History:  Procedure Laterality Date  . APPENDECTOMY    .  DG FEET 2 VIEWS BILAT    . LIPOMA EXCISION Right 08/15/2017   Procedure: EXCISION TUMOR(CYST) FOOT;  Surgeon: Albertine Patricia, DPM;  Location: [facility] ORS;  Service: Podiatry;  Laterality: Right;  . OTHER SURGICAL HISTORY Bilateral Foot surgery    Social History   Socioeconomic History  . Marital status: Widowed    Spouse name: Not on file  . Number of children: Not on file  . Years of education: Not on file  . Highest education level: Not on file  Occupational  History  . Not on file  Tobacco Use  . Smoking status: Former Smoker    Packs/day: 0.50    Types: Cigarettes    Start date: 2018  . Smokeless tobacco: Never Used  Vaping Use  . Vaping Use: Never used  Substance and Sexual Activity  . Alcohol use: Yes    Alcohol/week: 1.0 standard drink    Types: 1 Cans of beer per week    Comment: occ  . Drug use: No  . Sexual activity: Not on file  Other Topics Concern  . Not on file  Social History Narrative  . Not on file   Social Determinants of Health   Financial Resource Strain:   . Difficulty of Paying Living Expenses: Not on file  Food Insecurity:   . Worried About Charity fundraiser in the Last Year: Not on file  . Ran Out of Food in the Last Year: Not on file  Transportation Needs:   . Lack of Transportation (Medical): Not on file  . Lack of Transportation (Non-Medical): Not on file  Physical Activity:   . Days of Exercise per Week: Not on file  . Minutes of Exercise per Session: Not on file  Stress:   . Feeling of Stress : Not on file  Social Connections:   . Frequency of Communication with Friends and Family: Not on file  . Frequency of Social Gatherings with Friends and Family: Not on file  . Attends Religious Services: Not on file  . Active Member of Clubs or Organizations: Not on file  . Attends Archivist Meetings: Not on file  . Marital Status: Not on file  Intimate Partner Violence:   . Fear of Current or Ex-Partner: Not on file  . Emotionally Abused: Not on file  . Physically Abused: Not on file  . Sexually Abused: Not on file    Family History  Problem Relation Age of Onset  . Hypertension Mother      Current Outpatient Medications:  .  albuterol (VENTOLIN HFA) 108 (90 Base) MCG/ACT inhaler, Inhale 2 puffs into the lungs every 6 (six) hours as needed for wheezing or shortness of breath., Disp: , Rfl:  .  alendronate (FOSAMAX) 70 MG tablet, Take 70 mg by mouth once a week. , Disp: , Rfl:  .   allopurinol (ZYLOPRIM) 300 MG tablet, Take 600 mg by mouth 2 (two) times daily., Disp: , Rfl:  .  aspirin EC 81 MG tablet, Take 1 tablet by mouth daily., Disp: , Rfl:  .  buprenorphine-naloxone (SUBOXONE) 8-2 mg SUBL SL tablet, Place 1 tablet under the tongue 3 (three) times daily. , Disp: , Rfl:  .  cholecalciferol (VITAMIN D) 1000 units tablet, Take 1,000 Units by mouth daily., Disp: , Rfl:  .  clopidogrel (PLAVIX) 75 MG tablet, Take 75 mg by mouth daily. , Disp: , Rfl:  .  DULoxetine (CYMBALTA) 30 MG capsule, Take 1 capsule by mouth daily., Disp: ,  Rfl:  .  ezetimibe (ZETIA) 10 MG tablet, Take 10 mg by mouth daily., Disp: , Rfl:  .  gabapentin (NEURONTIN) 800 MG tablet, Take 800 mg by mouth 4 (four) times daily., Disp: , Rfl: 0 .  hydrochlorothiazide (HYDRODIURIL) 25 MG tablet, Take 25 mg by mouth daily. , Disp: , Rfl:  .  KOMBIGLYZE XR 2.04-999 MG TB24, Take 1 tablet by mouth 2 (two) times daily., Disp: , Rfl:  .  metoprolol succinate (TOPROL-XL) 50 MG 24 hr tablet, Take 50 mg by mouth daily., Disp: , Rfl:  .  pantoprazole (PROTONIX) 40 MG tablet, Take 40 mg by mouth daily., Disp: , Rfl: 1 .  TRELEGY ELLIPTA 100-62.5-25 MCG/INH AEPB, Inhale 1 puff into the lungs daily., Disp: , Rfl:  .  ferrous sulfate 325 (65 FE) MG tablet, Take 325 mg by mouth daily with breakfast. (Patient not taking: Reported on 10/27/2020), Disp: , Rfl:  .  fluticasone (FLONASE) 50 MCG/ACT nasal spray, Place 2 sprays into both nostrils daily. (Patient not taking: Reported on 10/27/2020), Disp: , Rfl: 5 .  ipratropium (ATROVENT) 0.02 % nebulizer solution, Inhale 2.5 mL (500 mcg total) by nebulization every 6 (six) hours., Disp: , Rfl:  .  simvastatin (ZOCOR) 40 MG tablet, Take 1 tablet by mouth daily at 6 PM., Disp: , Rfl:   Physical exam:  Vitals:   10/27/20 1457  BP: 125/75  Pulse: 92  Resp: 18  Temp: 98.6 F (37 C)  TempSrc: Tympanic  SpO2: 98%  Weight: 178 lb 12.8 oz (81.1 kg)   Physical  Exam Constitutional:      Comments: Thin elderly gentleman on home oxygen.  Cardiovascular:     Rate and Rhythm: Normal rate and regular rhythm.     Heart sounds: Normal heart sounds.  Pulmonary:     Effort: Pulmonary effort is normal.     Breath sounds: Normal breath sounds.  Abdominal:     General: Bowel sounds are normal.     Palpations: Abdomen is soft.  Skin:    General: Skin is warm and dry.  Neurological:     Mental Status: He is alert and oriented to person, place, and time.      CMP Latest Ref Rng & Units 10/27/2020  Glucose 70 - 99 mg/dL 105(H)  BUN 8 - 23 mg/dL 18  Creatinine 0.61 - 1.24 mg/dL 1.20  Sodium 135 - 145 mmol/L 137  Potassium 3.5 - 5.1 mmol/L 3.7  Chloride 98 - 111 mmol/L 96(L)  CO2 22 - 32 mmol/L 26  Calcium 8.9 - 10.3 mg/dL 9.4  Total Protein 6.5 - 8.1 g/dL 7.8  Total Bilirubin 0.3 - 1.2 mg/dL 0.4  Alkaline Phos 38 - 126 U/L 116  AST 15 - 41 U/L 19  ALT 0 - 44 U/L 10   CBC Latest Ref Rng & Units 10/27/2020  WBC 4.0 - 10.5 K/uL 9.3  Hemoglobin 13.0 - 17.0 g/dL 11.6(L)  Hematocrit 39 - 52 % 38.0(L)  Platelets 150 - 400 K/uL 284     Assessment and plan- Patient is a 64 y.o. male with history of stage I presumed lung cancer of the left upper lobe s/p SBRT now referred for normocytic anemia  Suspect patient's anemia secondary to chronic disease.  I will do a complete anemia work-up today including CBC with differential, CMP, ferritin and iron studies, B12 and folate, reticulocyte count, myeloma panel, serum free light chains LDH and TSH.  He has an appointment with me next month  following his CT chest which he will keep and I will discuss his blood work at that time   Visit Diagnosis 1. Normocytic anemia      Dr. Randa Evens, MD, MPH [facility] at [facility] [phone] 10/31/2020 8:52 AM

## 2020-11-08 ENCOUNTER — Other Ambulatory Visit: Payer: Self-pay

## 2020-11-08 ENCOUNTER — Ambulatory Visit
Admission: RE | Admit: 2020-11-08 | Discharge: 2020-11-08 | Disposition: A | Discharge status: Home / Self Care | Payer: Medicare HMO | Source: Ambulatory Visit | Attending: Radiation Oncology | Admitting: Radiation Oncology

## 2020-11-08 VITALS — BP 147/89 | Temp 96.7°F | Resp 20 | Wt 165.9 lb

## 2020-11-08 DIAGNOSIS — C3412 Malignant neoplasm of upper lobe, left bronchus or lung: Secondary | ICD-10-CM | POA: Diagnosis not present

## 2020-11-08 DIAGNOSIS — R918 Other nonspecific abnormal finding of lung field: Secondary | ICD-10-CM

## 2020-11-08 DIAGNOSIS — Z923 Personal history of irradiation: Secondary | ICD-10-CM | POA: Diagnosis not present

## 2020-11-08 NOTE — Addendum Note (Signed)
Encounter addended by: Daiva Huge, RN on: 11/08/2020 3:33 PM  Actions taken: Flowsheet accepted

## 2020-11-08 NOTE — Progress Notes (Signed)
Radiation Oncology Follow up Note  Name: Christian Fields   Date:   11/08/2020 MRN:  [id number] DOB: October 02, 1956    This 64 y.o. male presents to the clinic today for 1 month follow-up status post SBRT for stage I non-small cell lung cancer left upper lobe.  REFERRING PROVIDER: Remi Haggard, FNP  HPI: Patient is a 64 year old male now at 1 month having completed SBRT for a stage I non-small cell lung cancer left upper lobe.  Patient has significant pulmonary disease and shortness of breath although he tolerated his SBRT well he states his breathing lately is a little more labored he does have a mild clear productive cough no hemoptysis.  He is scheduled for CT scan.  In about 2 weeks  COMPLICATIONS OF TREATMENT: none  FOLLOW UP COMPLIANCE: keeps appointments   PHYSICAL EXAM:  There were no vitals taken for this visit. Mechele Claude elderly male wheelchair-bound on nasal oxygen in NAD.  Well-developed well-nourished patient in NAD. HEENT reveals PERLA, EOMI, discs not visualized.  Oral cavity is clear. No oral mucosal lesions are identified. Neck is clear without evidence of cervical or supraclavicular adenopathy. Lungs are clear to A&P. Cardiac examination is essentially unremarkable with regular rate and rhythm without murmur rub or thrill. Abdomen is benign with no organomegaly or masses noted. Motor sensory and DTR levels are equal and symmetric in the upper and lower extremities. Cranial nerves II through XII are grossly intact. Proprioception is intact. No peripheral adenopathy or edema is identified. No motor or sensory levels are noted. Crude visual fields are within normal range.  RADIOLOGY RESULTS: We will review CT scan becomes available.  PLAN: Present time patient is doing well.  We will evaluate his CT scan when it is available in the next couple of weeks.  Of asked to see him back in 3 to 4 months for follow-up.  Patient knows to call with any concerns.  I would like to take  this opportunity to thank you for allowing me to participate in the care of your patient.Noreene Filbert, MD

## 2020-11-12 ENCOUNTER — Encounter: Payer: Self-pay | Admitting: Emergency Medicine

## 2020-11-12 ENCOUNTER — Other Ambulatory Visit: Payer: Self-pay

## 2020-11-12 ENCOUNTER — Emergency Department: Payer: Medicare HMO

## 2020-11-12 ENCOUNTER — Inpatient Hospital Stay
Admission: EM | Admit: 2020-11-12 | Discharge: 2020-11-18 | DRG: 871 | Disposition: A | Discharge status: Home / Self Care | Payer: Medicare HMO | Attending: Internal Medicine | Admitting: Internal Medicine

## 2020-11-12 DIAGNOSIS — R6520 Severe sepsis without septic shock: Secondary | ICD-10-CM

## 2020-11-12 DIAGNOSIS — E1142 Type 2 diabetes mellitus with diabetic polyneuropathy: Secondary | ICD-10-CM

## 2020-11-12 DIAGNOSIS — F17200 Nicotine dependence, unspecified, uncomplicated: Secondary | ICD-10-CM | POA: Diagnosis not present

## 2020-11-12 DIAGNOSIS — A4102 Sepsis due to Methicillin resistant Staphylococcus aureus: Secondary | ICD-10-CM | POA: Diagnosis present

## 2020-11-12 DIAGNOSIS — G4730 Sleep apnea, unspecified: Secondary | ICD-10-CM | POA: Diagnosis present

## 2020-11-12 DIAGNOSIS — Z9981 Dependence on supplemental oxygen: Secondary | ICD-10-CM

## 2020-11-12 DIAGNOSIS — M109 Gout, unspecified: Secondary | ICD-10-CM | POA: Diagnosis present

## 2020-11-12 DIAGNOSIS — R9389 Abnormal findings on diagnostic imaging of other specified body structures: Secondary | ICD-10-CM

## 2020-11-12 DIAGNOSIS — E119 Type 2 diabetes mellitus without complications: Secondary | ICD-10-CM

## 2020-11-12 DIAGNOSIS — Z923 Personal history of irradiation: Secondary | ICD-10-CM

## 2020-11-12 DIAGNOSIS — Z8701 Personal history of pneumonia (recurrent): Secondary | ICD-10-CM | POA: Diagnosis not present

## 2020-11-12 DIAGNOSIS — J15212 Pneumonia due to Methicillin resistant Staphylococcus aureus: Secondary | ICD-10-CM | POA: Diagnosis present

## 2020-11-12 DIAGNOSIS — J189 Pneumonia, unspecified organism: Secondary | ICD-10-CM | POA: Diagnosis not present

## 2020-11-12 DIAGNOSIS — F1120 Opioid dependence, uncomplicated: Secondary | ICD-10-CM | POA: Diagnosis present

## 2020-11-12 DIAGNOSIS — D649 Anemia, unspecified: Secondary | ICD-10-CM | POA: Diagnosis present

## 2020-11-12 DIAGNOSIS — C3492 Malignant neoplasm of unspecified part of left bronchus or lung: Secondary | ICD-10-CM | POA: Diagnosis not present

## 2020-11-12 DIAGNOSIS — J449 Chronic obstructive pulmonary disease, unspecified: Secondary | ICD-10-CM | POA: Diagnosis present

## 2020-11-12 DIAGNOSIS — Z79899 Other long term (current) drug therapy: Secondary | ICD-10-CM

## 2020-11-12 DIAGNOSIS — I10 Essential (primary) hypertension: Secondary | ICD-10-CM | POA: Diagnosis present

## 2020-11-12 DIAGNOSIS — T380X5A Adverse effect of glucocorticoids and synthetic analogues, initial encounter: Secondary | ICD-10-CM | POA: Diagnosis present

## 2020-11-12 DIAGNOSIS — Z20822 Contact with and (suspected) exposure to covid-19: Secondary | ICD-10-CM | POA: Diagnosis present

## 2020-11-12 DIAGNOSIS — Z86718 Personal history of other venous thrombosis and embolism: Secondary | ICD-10-CM | POA: Diagnosis not present

## 2020-11-12 DIAGNOSIS — A419 Sepsis, unspecified organism: Secondary | ICD-10-CM

## 2020-11-12 DIAGNOSIS — C3412 Malignant neoplasm of upper lobe, left bronchus or lung: Secondary | ICD-10-CM | POA: Diagnosis present

## 2020-11-12 DIAGNOSIS — Z7982 Long term (current) use of aspirin: Secondary | ICD-10-CM | POA: Diagnosis not present

## 2020-11-12 DIAGNOSIS — J432 Centrilobular emphysema: Secondary | ICD-10-CM | POA: Diagnosis present

## 2020-11-12 DIAGNOSIS — I159 Secondary hypertension, unspecified: Secondary | ICD-10-CM | POA: Diagnosis not present

## 2020-11-12 DIAGNOSIS — J9621 Acute and chronic respiratory failure with hypoxia: Secondary | ICD-10-CM

## 2020-11-12 DIAGNOSIS — E785 Hyperlipidemia, unspecified: Secondary | ICD-10-CM | POA: Diagnosis present

## 2020-11-12 DIAGNOSIS — Z8249 Family history of ischemic heart disease and other diseases of the circulatory system: Secondary | ICD-10-CM

## 2020-11-12 DIAGNOSIS — Z87891 Personal history of nicotine dependence: Secondary | ICD-10-CM | POA: Diagnosis not present

## 2020-11-12 DIAGNOSIS — R0603 Acute respiratory distress: Secondary | ICD-10-CM | POA: Diagnosis present

## 2020-11-12 DIAGNOSIS — Z7983 Long term (current) use of bisphosphonates: Secondary | ICD-10-CM | POA: Diagnosis not present

## 2020-11-12 LAB — CBC WITH DIFFERENTIAL/PLATELET
Abs Immature Granulocytes: 1.54 10*3/uL — ABNORMAL HIGH (ref 0.00–0.07)
Basophils Absolute: 0.2 10*3/uL — ABNORMAL HIGH (ref 0.0–0.1)
Basophils Relative: 0 %
Eosinophils Absolute: 0.1 10*3/uL (ref 0.0–0.5)
Eosinophils Relative: 0 %
HCT: 35.8 % — ABNORMAL LOW (ref 39.0–52.0)
Hemoglobin: 10.8 g/dL — ABNORMAL LOW (ref 13.0–17.0)
Immature Granulocytes: 3 %
Lymphocytes Relative: 2 %
Lymphs Abs: 0.9 10*3/uL (ref 0.7–4.0)
MCH: 28.1 pg (ref 26.0–34.0)
MCHC: 30.2 g/dL (ref 30.0–36.0)
MCV: 93.2 fL (ref 80.0–100.0)
Monocytes Absolute: 1.6 10*3/uL — ABNORMAL HIGH (ref 0.1–1.0)
Monocytes Relative: 3 %
Neutro Abs: 48.2 10*3/uL — ABNORMAL HIGH (ref 1.7–7.7)
Neutrophils Relative %: 92 %
Platelets: 583 10*3/uL — ABNORMAL HIGH (ref 150–400)
RBC: 3.84 MIL/uL — ABNORMAL LOW (ref 4.22–5.81)
RDW: 15.6 % — ABNORMAL HIGH (ref 11.5–15.5)
Smear Review: UNDETERMINED
WBC: 52.6 10*3/uL (ref 4.0–10.5)
nRBC: 0 % (ref 0.0–0.2)

## 2020-11-12 LAB — RESP PANEL BY RT-PCR (FLU A&B, COVID) ARPGX2
Influenza A by PCR: NEGATIVE
Influenza B by PCR: NEGATIVE
SARS Coronavirus 2 by RT PCR: NEGATIVE

## 2020-11-12 LAB — PROCALCITONIN: Procalcitonin: 5.68 ng/mL

## 2020-11-12 LAB — COMPREHENSIVE METABOLIC PANEL
ALT: 12 U/L (ref 0–44)
AST: 15 U/L (ref 15–41)
Albumin: 2.9 g/dL — ABNORMAL LOW (ref 3.5–5.0)
Alkaline Phosphatase: 108 U/L (ref 38–126)
Anion gap: 11 (ref 5–15)
BUN: 11 mg/dL (ref 8–23)
CO2: 26 mmol/L (ref 22–32)
Calcium: 8.7 mg/dL — ABNORMAL LOW (ref 8.9–10.3)
Chloride: 100 mmol/L (ref 98–111)
Creatinine, Ser: 1.08 mg/dL (ref 0.61–1.24)
GFR, Estimated: >60 mL/min (ref >60)
Glucose, Bld: 191 mg/dL — ABNORMAL HIGH (ref 70–99)
Potassium: 3.3 mmol/L — ABNORMAL LOW (ref 3.5–5.1)
Sodium: 137 mmol/L (ref 135–145)
Total Bilirubin: 0.5 mg/dL (ref 0.3–1.2)
Total Protein: 7.3 g/dL (ref 6.5–8.1)

## 2020-11-12 LAB — APTT: aPTT: 38 seconds — ABNORMAL HIGH (ref 24–36)

## 2020-11-12 LAB — BRAIN NATRIURETIC PEPTIDE: B Natriuretic Peptide: 145.1 pg/mL — ABNORMAL HIGH (ref 0.0–100.0)

## 2020-11-12 LAB — PROTIME-INR
INR: 1.1 (ref 0.8–1.2)
Prothrombin Time: 14.1 seconds (ref 11.4–15.2)

## 2020-11-12 LAB — TROPONIN I (HIGH SENSITIVITY): Troponin I (High Sensitivity): 21 ng/L — ABNORMAL HIGH (ref <18)

## 2020-11-12 LAB — LACTIC ACID, PLASMA
Lactic Acid, Venous: 1.7 mmol/L (ref 0.5–1.9)
Lactic Acid, Venous: 2.4 mmol/L (ref 0.5–1.9)

## 2020-11-12 MED ORDER — FLUTICASONE PROPIONATE 50 MCG/ACT NA SUSP
2.0000 | Freq: Every day | NASAL | Status: DC
  Administered 2020-11-13 – 2020-11-18 (×6): 2 via NASAL
  Filled 2020-11-12: qty 16

## 2020-11-12 MED ORDER — CLOPIDOGREL BISULFATE 75 MG PO TABS
75.0000 mg | ORAL_TABLET | Freq: Every day | ORAL | Status: DC
  Administered 2020-11-13 – 2020-11-18 (×6): 75 mg via ORAL
  Filled 2020-11-12 (×6): qty 1

## 2020-11-12 MED ORDER — INSULIN ASPART 100 UNIT/ML ~~LOC~~ SOLN
0.0000 [IU] | Freq: Three times a day (TID) | SUBCUTANEOUS | Status: DC
  Administered 2020-11-13: 4 [IU] via SUBCUTANEOUS
  Administered 2020-11-13: 1 [IU] via SUBCUTANEOUS
  Administered 2020-11-13: 4 [IU] via SUBCUTANEOUS
  Administered 2020-11-14: 3 [IU] via SUBCUTANEOUS
  Administered 2020-11-14: 4 [IU] via SUBCUTANEOUS
  Administered 2020-11-14 – 2020-11-18 (×4): 3 [IU] via SUBCUTANEOUS
  Filled 2020-11-12 (×10): qty 1

## 2020-11-12 MED ORDER — METOPROLOL SUCCINATE ER 50 MG PO TB24
50.0000 mg | ORAL_TABLET | Freq: Every day | ORAL | Status: DC
  Administered 2020-11-13 – 2020-11-18 (×6): 50 mg via ORAL
  Filled 2020-11-12 (×6): qty 1

## 2020-11-12 MED ORDER — FERROUS SULFATE 325 (65 FE) MG PO TABS
325.0000 mg | ORAL_TABLET | Freq: Every day | ORAL | Status: DC
  Administered 2020-11-13 – 2020-11-18 (×6): 325 mg via ORAL
  Filled 2020-11-12 (×6): qty 1

## 2020-11-12 MED ORDER — FLUTICASONE-UMECLIDIN-VILANT 100-62.5-25 MCG/INH IN AEPB: 1.0000 | INHALATION_SPRAY | Freq: Every day | RESPIRATORY_TRACT | Status: DC

## 2020-11-12 MED ORDER — IPRATROPIUM-ALBUTEROL 0.5-2.5 (3) MG/3ML IN SOLN
3.0000 mL | Freq: Four times a day (QID) | RESPIRATORY_TRACT | Status: DC
  Administered 2020-11-13 – 2020-11-14 (×8): 3 mL via RESPIRATORY_TRACT
  Filled 2020-11-12 (×8): qty 3

## 2020-11-12 MED ORDER — SODIUM CHLORIDE 0.9 % IV SOLN
500.0000 mg | Freq: Once | INTRAVENOUS | Status: AC
  Administered 2020-11-12: 500 mg via INTRAVENOUS
  Filled 2020-11-12: qty 500

## 2020-11-12 MED ORDER — ENOXAPARIN SODIUM 40 MG/0.4ML ~~LOC~~ SOLN
40.0000 mg | SUBCUTANEOUS | Status: DC
  Administered 2020-11-13 – 2020-11-17 (×5): 40 mg via SUBCUTANEOUS
  Filled 2020-11-12 (×5): qty 0.4

## 2020-11-12 MED ORDER — VITAMIN D 25 MCG (1000 UNIT) PO TABS
1000.0000 [IU] | ORAL_TABLET | Freq: Every day | ORAL | Status: DC
  Administered 2020-11-13 – 2020-11-18 (×6): 1000 [IU] via ORAL
  Filled 2020-11-12 (×6): qty 1

## 2020-11-12 MED ORDER — PANTOPRAZOLE SODIUM 40 MG PO TBEC
40.0000 mg | DELAYED_RELEASE_TABLET | Freq: Every day | ORAL | Status: DC
  Administered 2020-11-13 – 2020-11-18 (×6): 40 mg via ORAL
  Filled 2020-11-12 (×6): qty 1

## 2020-11-12 MED ORDER — METFORMIN HCL ER 500 MG PO TB24
1000.0000 mg | ORAL_TABLET | Freq: Two times a day (BID) | ORAL | Status: DC
  Administered 2020-11-13 – 2020-11-17 (×10): 1000 mg via ORAL
  Filled 2020-11-12 (×16): qty 2

## 2020-11-12 MED ORDER — SAXAGLIPTIN-METFORMIN ER 2.5-1000 MG PO TB24: 1.0000 | ORAL_TABLET | Freq: Two times a day (BID) | ORAL | Status: DC

## 2020-11-12 MED ORDER — SIMVASTATIN 20 MG PO TABS
40.0000 mg | ORAL_TABLET | Freq: Every day | ORAL | Status: DC
  Administered 2020-11-13 – 2020-11-17 (×5): 40 mg via ORAL
  Filled 2020-11-12 (×3): qty 2
  Filled 2020-11-12: qty 4
  Filled 2020-11-12: qty 2

## 2020-11-12 MED ORDER — LINAGLIPTIN 5 MG PO TABS
5.0000 mg | ORAL_TABLET | Freq: Every day | ORAL | Status: DC
  Administered 2020-11-13 – 2020-11-18 (×6): 5 mg via ORAL
  Filled 2020-11-12 (×9): qty 1

## 2020-11-12 MED ORDER — TRAZODONE HCL 50 MG PO TABS
25.0000 mg | ORAL_TABLET | Freq: Every evening | ORAL | Status: DC | PRN
  Administered 2020-11-13 – 2020-11-15 (×2): 25 mg via ORAL
  Filled 2020-11-12 (×2): qty 1

## 2020-11-12 MED ORDER — SODIUM CHLORIDE 0.9 % IV SOLN
2.0000 g | INTRAVENOUS | Status: DC
  Administered 2020-11-13 – 2020-11-16 (×4): 2 g via INTRAVENOUS
  Filled 2020-11-12: qty 2
  Filled 2020-11-12: qty 20
  Filled 2020-11-12: qty 2
  Filled 2020-11-12 (×2): qty 20

## 2020-11-12 MED ORDER — GABAPENTIN 400 MG PO CAPS
800.0000 mg | ORAL_CAPSULE | Freq: Four times a day (QID) | ORAL | Status: DC
  Administered 2020-11-13 – 2020-11-18 (×22): 800 mg via ORAL
  Filled 2020-11-12 (×25): qty 2

## 2020-11-12 MED ORDER — KETOROLAC TROMETHAMINE 30 MG/ML IJ SOLN
15.0000 mg | Freq: Four times a day (QID) | INTRAMUSCULAR | Status: DC | PRN
  Administered 2020-11-13: 15 mg via INTRAVENOUS
  Filled 2020-11-12: qty 1

## 2020-11-12 MED ORDER — LACTATED RINGERS IV BOLUS
1000.0000 mL | Freq: Once | INTRAVENOUS | Status: AC
  Administered 2020-11-12: 1000 mL via INTRAVENOUS

## 2020-11-12 MED ORDER — AZITHROMYCIN 500 MG PO TABS
500.0000 mg | ORAL_TABLET | Freq: Every day | ORAL | Status: DC
  Administered 2020-11-13: 500 mg via ORAL
  Filled 2020-11-12: qty 1

## 2020-11-12 MED ORDER — DULOXETINE HCL 30 MG PO CPEP
30.0000 mg | ORAL_CAPSULE | Freq: Every day | ORAL | Status: DC
  Administered 2020-11-13 – 2020-11-18 (×6): 30 mg via ORAL
  Filled 2020-11-12 (×6): qty 1

## 2020-11-12 MED ORDER — ALLOPURINOL 300 MG PO TABS
600.0000 mg | ORAL_TABLET | Freq: Two times a day (BID) | ORAL | Status: DC
  Administered 2020-11-13 – 2020-11-18 (×11): 600 mg via ORAL
  Filled 2020-11-12 (×14): qty 2

## 2020-11-12 MED ORDER — EZETIMIBE 10 MG PO TABS
10.0000 mg | ORAL_TABLET | Freq: Every day | ORAL | Status: DC
  Administered 2020-11-13 – 2020-11-18 (×6): 10 mg via ORAL
  Filled 2020-11-12 (×7): qty 1

## 2020-11-12 MED ORDER — SODIUM CHLORIDE 0.9 % IV SOLN
1.0000 g | Freq: Once | INTRAVENOUS | Status: AC
  Administered 2020-11-12: 1 g via INTRAVENOUS
  Filled 2020-11-12: qty 10

## 2020-11-12 MED ORDER — ASPIRIN EC 81 MG PO TBEC
81.0000 mg | DELAYED_RELEASE_TABLET | Freq: Every day | ORAL | Status: DC
  Administered 2020-11-13 – 2020-11-18 (×6): 81 mg via ORAL
  Filled 2020-11-12 (×6): qty 1

## 2020-11-12 MED ORDER — HYDROCHLOROTHIAZIDE 25 MG PO TABS
25.0000 mg | ORAL_TABLET | Freq: Every day | ORAL | Status: DC
  Administered 2020-11-13 – 2020-11-18 (×6): 25 mg via ORAL
  Filled 2020-11-12 (×6): qty 1

## 2020-11-12 NOTE — ED Notes (Signed)
Date and time results received: 11/12/20 2159 (use smartphrase ".now" to insert current time)  Test: WBC Critical Value: 52.6  Name of Provider Notified: Archie Balboa, MD  Orders Received? Or Actions Taken?: Orders Received - See Orders for details

## 2020-11-12 NOTE — ED Triage Notes (Signed)
Patient arrives via EMS from home in respiratory distress. Patient have history of COPD and normally wears 4L [place] at home. Patient states he hasn't been feeling well "the past few days." Patient is AOx4.

## 2020-11-12 NOTE — H&P (Addendum)
History and Physical    Christian Fields ION:[id number] DOB: 11/05/1956 DOA: 11/12/2020  PCP: Remi Haggard, FNP (Confirm with patient/family/NH records and if not entered, this has to be entered at [facility] point of entry) Patient coming from: home  I have personally briefly reviewed patient's old medical records in [place]  Chief Complaint: progressive SOB/DOE  HPI: Christian Fields is a 64 y.o. 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 He was admitted in April '21 for CAP. Due to his progressive SOB he called EMS. In route he was given Nebulizer treatment and IV steroids for his COPD symptoms.   ED Course: T 100.4, 126/ 84, HR 134  RR 24. Patient was in respiratory distress on presentation and was put to BiPAP with a good response. ED-PA exam notable for increased WOB and diffuse expiratory wheezing. He was given 1 L IVF and was started on Rocephin and Azithromycin for CAP. [name] called to admit patient for continued treatment.   Review of Systems: As per HPI otherwise 10 point review of systems negative.    Past Medical History:  Diagnosis Date  . Anemia   . Anxiety   . Arthritis   . Asthma   . Cancer ([place])    Basal Cell Skin Cancer  . Chronic back pain   . COPD (chronic obstructive pulmonary disease) ([employer])   . Depression   . Diabetes mellitus ([place])   . Dyspnea   . GERD (gastroesophageal reflux disease)   . Gout   . Gout   . Headache   . History of blood clots    Left Leg--July 2018  . History of kidney stones   . Hyperlipidemia   . Hyperlipidemia   . Hypertension   . Kidney stones   . Neuropathy   . On home oxygen therapy    2 L / M  . Pneumonia 06/2017  . Sleep apnea   . Ulcer of foot ([place])    Right    Past Surgical History:  Procedure Laterality Date  . APPENDECTOMY    . DG FEET 2 VIEWS BILAT    .  LIPOMA EXCISION Right 08/15/2017   Procedure: EXCISION TUMOR(CYST) FOOT;  Surgeon: Albertine Patricia, DPM;  Location: [facility] ORS;  Service: Podiatry;  Laterality: Right;  . OTHER SURGICAL HISTORY Bilateral Foot surgery    Soc Hx - widowed after 20+ years of marriage. He has two sons and 1 daughter, many grandchildren and has a great-grandchild on the way. 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 He lives in his own home. His son and son's fiance live with him. He requires assistance with ADLs.   reports that he has quit smoking. His smoking use included cigarettes. He started smoking about 3 years ago. He smoked 0.50 packs per day. He has never used smokeless tobacco. He reports current alcohol use of about 1.0 standard drink of alcohol per week. He reports that he does not use drugs.  Allergies  Allergen Reactions  . Bee Venom Anaphylaxis  . Hydrocodone-Acetaminophen Itching    Family History  Problem Relation Age of Onset  . Hypertension Mother      Prior to Admission medications   Medication Sig Start Date End Date Taking? Authorizing Provider  albuterol (VENTOLIN HFA) 108 (90 Base) MCG/ACT inhaler Inhale  2 puffs into the lungs every 6 (six) hours as needed for wheezing or shortness of breath.    [provider]  alendronate (FOSAMAX) 70 MG tablet Take 70 mg by mouth once a week.  06/23/17   [provider]  allopurinol (ZYLOPRIM) 300 MG tablet Take 600 mg by mouth 2 (two) times daily. 02/24/20   [provider]  aspirin EC 81 MG tablet Take 1 tablet by mouth daily.    [provider]  buprenorphine-naloxone (SUBOXONE) 8-2 mg SUBL SL tablet Place 1 tablet under the tongue 3 (three) times daily.     [provider]  cholecalciferol (VITAMIN D) 1000 units tablet Take 1,000 Units by mouth daily.    [provider]  clopidogrel (PLAVIX) 75 MG tablet Take 75 mg by mouth daily.  02/24/20   [provider]   DULoxetine (CYMBALTA) 30 MG capsule Take 1 capsule by mouth daily. 01/17/16   [provider]  ezetimibe (ZETIA) 10 MG tablet Take 10 mg by mouth daily. 02/24/20   [provider]  ferrous sulfate 325 (65 FE) MG tablet Take 325 mg by mouth daily with breakfast. Patient not taking: Reported on 10/27/2020    [provider]  fluticasone (FLONASE) 50 MCG/ACT nasal spray Place 2 sprays into both nostrils daily. Patient not taking: Reported on 10/27/2020 12/18/15   [provider]  gabapentin (NEURONTIN) 800 MG tablet Take 800 mg by mouth 4 (four) times daily. 12/18/15   [provider]  hydrochlorothiazide (HYDRODIURIL) 25 MG tablet Take 25 mg by mouth daily.  01/17/16   [provider]  ipratropium (ATROVENT) 0.02 % nebulizer solution Inhale 2.5 mL (500 mcg total) by nebulization every 6 (six) hours. 12/16/16 01/15/19  [provider]  KOMBIGLYZE XR 2.04-999 MG TB24 Take 1 tablet by mouth 2 (two) times daily. 01/17/16   [provider]  metoprolol succinate (TOPROL-XL) 50 MG 24 hr tablet Take 50 mg by mouth daily. 02/24/20   [provider]  pantoprazole (PROTONIX) 40 MG tablet Take 40 mg by mouth daily. 12/18/15   [provider]  simvastatin (ZOCOR) 40 MG tablet Take 1 tablet by mouth daily at 6 PM. 01/17/16   [provider]  TRELEGY ELLIPTA 100-62.5-25 MCG/INH AEPB Inhale 1 puff into the lungs daily. 12/31/19   [provider]    Physical Exam: Vitals:   11/12/20 2127 11/12/20 2130 11/12/20 2200 11/12/20 2230  BP:  130/79 126/84 128/89  Pulse:  (!) 145 (!) 134 (!) 126  Resp:  (!) 27 (!) 24 20  Temp:      TempSrc:      SpO2: 93% 93% 92% 93%  Weight:      Height:         Vitals:   11/12/20 2127 11/12/20 2130 11/12/20 2200 11/12/20 2230  BP:  130/79 126/84 128/89  Pulse:  (!) 145 (!) 134 (!) 126  Resp:  (!) 27 (!) 24 20  Temp:      TempSrc:      SpO2: 93% 93% 92% 93%  Weight:      Height:        General: Plethoric overweight man who looks older than his chronologic age, on BiPAP but in no apparent distress. Eyes: PERRL, lids and conjunctiva normal ENMT: exam hindered by BiPAP mask  Neck: normal, supple, no masses, no thyromegaly Respiratory: on BiPAP, no use of accessory musculature, feint bibasilar crackles, no wheezing, prolonged expiratory phase.  Cardiovascular: Regular rate  and rhythm, no murmurs / rubs / gallops. 1+ Lower extremity edema. 2+ pedal pulses. No carotid bruits.  Abdomen: protuberant, no tenderness, no masses palpated. No hepatosplenomegaly. Bowel sounds positive.  Musculoskeletal: no clubbing / cyanosis. mild PID/DIP joint deformity upper extremities. Good ROM, no contractures. Normal muscle tone.  Skin: no rashes, lesions, ulcers. No induration Neurologic: CN 2-12 grossly intact. . Strength 5/5 in all 4.  Psychiatric: Normal judgment and insight. Alert and oriented x 3. Normal mood.     Labs on Admission: I have personally reviewed following labs and imaging studies  CBC: Recent Labs  Lab 11/12/20 2108  WBC 52.6*  NEUTROABS 48.2*  HGB 10.8*  HCT 35.8*  MCV 93.2  PLT 174*   Basic Metabolic Panel: Recent Labs  Lab 11/12/20 2108  NA 137  K 3.3*  CL 100  CO2 26  GLUCOSE 191*  BUN 11  CREATININE 1.08  CALCIUM 8.7*   GFR: Estimated Creatinine Clearance: 66 mL/min (by C-G formula based on SCr of 1.08 mg/dL). Liver Function Tests: Recent Labs  Lab 11/12/20 2108  AST 15  ALT 12  ALKPHOS 108  BILITOT 0.5  PROT 7.3  ALBUMIN 2.9*   No results for input(s): LIPASE, AMYLASE in the last 168 hours. No results for input(s): AMMONIA in the last 168 hours. Coagulation Profile: Recent Labs  Lab 11/12/20 2108  INR 1.1   Cardiac Enzymes: No results for input(s): CKTOTAL, CKMB, CKMBINDEX, TROPONINI in the last 168 hours. BNP (last 3 results) No results for input(s): PROBNP in the last 8760 hours. HbA1C: No results for input(s): HGBA1C in the  last 72 hours. CBG: No results for input(s): GLUCAP in the last 168 hours. Lipid Profile: No results for input(s): CHOL, HDL, LDLCALC, TRIG, CHOLHDL, LDLDIRECT in the last 72 hours. Thyroid Function Tests: No results for input(s): TSH, T4TOTAL, FREET4, T3FREE, THYROIDAB in the last 72 hours. Anemia Panel: No results for input(s): VITAMINB12, FOLATE, FERRITIN, TIBC, IRON, RETICCTPCT in the last 72 hours. Urine analysis:    Component Value Date/Time   COLORURINE STRAW (A) 01/19/2016 2114   APPEARANCEUR CLEAR (A) 01/19/2016 2114   APPEARANCEUR Clear 03/24/2013 1853   LABSPEC 1.003 (L) 01/19/2016 2114   LABSPEC 1.020 03/24/2013 1853   PHURINE 5.0 01/19/2016 2114   GLUCOSEU NEGATIVE 01/19/2016 2114   GLUCOSEU Negative 03/24/2013 1853   HGBUR NEGATIVE 01/19/2016 2114   BILIRUBINUR NEGATIVE 01/19/2016 2114   BILIRUBINUR Negative 03/24/2013 1853   KETONESUR NEGATIVE 01/19/2016 2114   PROTEINUR NEGATIVE 01/19/2016 2114   NITRITE NEGATIVE 01/19/2016 2114   LEUKOCYTESUR NEGATIVE 01/19/2016 2114   LEUKOCYTESUR Negative 03/24/2013 1853    Radiological Exams on Admission: DG Chest Port 1 View  Result Date: 11/12/2020 CLINICAL DATA:  Respiratory distress, COPD, sepsis EXAM: PORTABLE CHEST 1 VIEW COMPARISON:  03/25/2020 FINDINGS: 2 frontal views of the chest demonstrate a stable cardiac silhouette. There is extensive background scarring and fibrosis compatible with emphysema. Multifocal areas of airspace disease are identified consistent with superimposed pneumonia. This is most pronounced within the suprahilar regions. The left upper lobe nodule previously evaluated by PET scan again noted over the left anterior fifth rib. No effusion or pneumothorax. No acute bony abnormalities. IMPRESSION: 1. Multifocal bilateral airspace disease concerning for edema or infection superimposed upon background emphysema. 2. Persistent left upper lobe nodule previously evaluated by CT and PET scan. Electronically  Signed   By: Randa Ngo M.D.   On: 11/12/2020 21:42    EKG: Independently reviewed. Sinus tachycardia, old anterior  injury, no acute changes, no STEMI  Assessment/Plan Active Problems:   CAP (community acquired pneumonia)   Hypertension   Chronic obstructive pulmonary disease, unspecified (HCC)   NSCLC of left lung (HCC)   Hyperlipidemia   Diabetes mellitus ([place])   Tobacco use disorder   Sleep apnea   1. CAP - patient with fever, leukocytosis, hypoxemia, mild cough and CXR revealing multifocal bilateral air-space disease all c/w pneumonia. He was admitted as recently as April '21 for CAP. Patient was initially in respiratory failure with low sats and has done well on BiPAP. Plan Urine Ag studies, blood cultures ordered and pending.  Screening for MRSA ordered and pending  Continue Rocephin and Azithromycin  F/u CBC and CXR 11/13/20  2. COPD - patient with O2 dependent emphysema. He is followed by pulmonolgy at [facility]. He uses nebulizer treatments at home.  Currently with exacerbation 2/2 #1. Plan Continue BiPAP - RT to assist with weaning to [place] oxygen as possible, to keep O2 sat >85%  Continue Trelegy Ellipta  [place] nebulizer treatment q 6 hours.    3. NSCCL - not a candidate for surgical excision due to severe emphysema. He has had XRT Dr. Donella Stade.  4. DM - last A1C 5.4% 03/26/20. Plan Continue Kobmbiglyze - renal function OK  Repeart A1C  Sliding scale coverage  5. HTN - continue home meds  6. HLD - continue home meds  7. Anemia - chronic anemia - followed by hematology  8. Gout - w/o tophi, w/o active flare Plan Continue home med - allopurinol    DVT prophylaxis: lovenox  Code Status: full code - discussed with patient  Family Communication: no answer at patient's phone. No number for daughter. He did not want me to call his brother.  Disposition Plan: home when medically stable  Consults called: none  Admission status: inpatient/step-down    Adella Hare MD [facility] Pager [phone]  If 7PM-7AM, please contact night-coverage [email or web address] Password [facility]  11/12/2020, 10:54 PM

## 2020-11-12 NOTE — ED Provider Notes (Signed)
[facility] Emergency Department Provider Note   ____________________________________________   I have reviewed the triage vital signs and the nursing notes.   HISTORY  Chief Complaint Respiratory Distress   History limited by: Respiratory distress   HPI Christian Fields is a 64 y.o. male who presents to the emergency department today because of concerns for difficulty with breathing.  Patient states he has a history of COPD.  Over the past couple days he has noticed that he has had a harder time with breathing.  He normally is on 2 L of oxygen although he states he has been to doctors himself to 4 L at home.  Additionally he has been trying his breathing treatments at home without any significant relief.  He has had a cough.  He states he has not noticed any fevers at home.  No known sick contacts.  Records reviewed. Per medical record review patient has a history of COPD.   Past Medical History:  Diagnosis Date  . Anemia   . Anxiety   . Arthritis   . Asthma   . Cancer ([place])    Basal Cell Skin Cancer  . Chronic back pain   . COPD (chronic obstructive pulmonary disease) ([place])   . Depression   . Diabetes mellitus ([place])   . Dyspnea   . GERD (gastroesophageal reflux disease)   . Gout   . Gout   . Headache   . History of blood clots    Left Leg--July 2018  . History of kidney stones   . Hyperlipidemia   . Hyperlipidemia   . Hypertension   . Kidney stones   . Neuropathy   . On home oxygen therapy    2 L / M  . Pneumonia 06/2017  . Sleep apnea   . Ulcer of foot [facility])    Right    Patient Active Problem List   Diagnosis Date Noted  . Confusion   . Acute on chronic respiratory failure with hypoxia ([place]) 03/26/2020  . Pneumonia of both lower lobes due to infectious organism 03/25/2020  . Arthritis 10/29/2019  . Hemorrhoids 10/29/2019  . Senile nuclear sclerosis, bilateral 02/15/2019  . Cellulitis of left upper limb 02/11/2019  . AAA  (abdominal aortic aneurysm) without rupture ([place]) 01/15/2019  . Localized swelling, mass and lump, upper limb 12/24/2018  . Pain in femur 12/24/2018  . TIA (transient ischemic attack) 11/18/2018  . Cortical age-related cataract of both eyes 11/02/2018  . Fracture of multiple ribs 04/20/2018  . Overweight (BMI 25.0-29.9) 04/20/2018  . Hypertension 07/08/2017  . Diabetes mellitus ([place]) 07/08/2017  . Hyperlipidemia 07/08/2017  . Tobacco use disorder 07/08/2017  . Atherosclerosis of native arteries of extremity with intermittent claudication ([place]) 07/08/2017  . SOB (shortness of breath) 06/19/2017  . Acute on chronic respiratory failure with hypoxia and hypercapnia ([place]) 12/15/2016  . CAP (community acquired pneumonia) 12/15/2016  . Chronic pain 12/15/2016  . Chronically on opiate therapy 12/15/2016  . History of kidney stones 12/15/2016  . Polypharmacy 12/15/2016  . Somnolence 12/15/2016  . Foot ulcer ([place]) 01/19/2016  . 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 Other psychoactive substance use, unspecified with withdrawal, uncomplicated ([place]) 62/70/3500  . Type 2 diabetes mellitus with foot ulcer (CODE) ([place]) 11/27/2015  . Chronic obstructive pulmonary disease, unspecified ([place]) 01/18/2013  . Depressive disorder 01/18/2013  . Hereditary and idiopathic peripheral neuropathy 01/18/2013  . Sleep apnea 01/18/2013  . Low back pain 11/23/2010  . Acute gouty arthropathy  07/10/2010  . Problem related to lifestyle 10/29/2004    Past Surgical History:  Procedure Laterality Date  . APPENDECTOMY    . DG FEET 2 VIEWS BILAT    . LIPOMA EXCISION Right 08/15/2017   Procedure: EXCISION TUMOR(CYST) FOOT;  Surgeon: Albertine Patricia, DPM;  Location: [facility] ORS;  Service: Podiatry;  Laterality: Right;  . OTHER SURGICAL HISTORY Bilateral Foot surgery    Prior to Admission medications   Medication Sig Start Date End Date Taking? Authorizing Provider  albuterol (VENTOLIN HFA) 108  (90 Base) MCG/ACT inhaler Inhale 2 puffs into the lungs every 6 (six) hours as needed for wheezing or shortness of breath.    [provider]  alendronate (FOSAMAX) 70 MG tablet Take 70 mg by mouth once a week.  06/23/17   [provider]  allopurinol (ZYLOPRIM) 300 MG tablet Take 600 mg by mouth 2 (two) times daily. 02/24/20   [provider]  aspirin EC 81 MG tablet Take 1 tablet by mouth daily.    [provider]  buprenorphine-naloxone (SUBOXONE) 8-2 mg SUBL SL tablet Place 1 tablet under the tongue 3 (three) times daily.     [provider]  cholecalciferol (VITAMIN D) 1000 units tablet Take 1,000 Units by mouth daily.    [provider]  clopidogrel (PLAVIX) 75 MG tablet Take 75 mg by mouth daily.  02/24/20   [provider]  DULoxetine (CYMBALTA) 30 MG capsule Take 1 capsule by mouth daily. 01/17/16   [provider]  ezetimibe (ZETIA) 10 MG tablet Take 10 mg by mouth daily. 02/24/20   [provider]  ferrous sulfate 325 (65 FE) MG tablet Take 325 mg by mouth daily with breakfast. Patient not taking: Reported on 10/27/2020    [provider]  fluticasone (FLONASE) 50 MCG/ACT nasal spray Place 2 sprays into both nostrils daily. Patient not taking: Reported on 10/27/2020 12/18/15   [provider]  gabapentin (NEURONTIN) 800 MG tablet Take 800 mg by mouth 4 (four) times daily. 12/18/15   [provider]  hydrochlorothiazide (HYDRODIURIL) 25 MG tablet Take 25 mg by mouth daily.  01/17/16   [provider]  ipratropium (ATROVENT) 0.02 % nebulizer solution Inhale 2.5 mL (500 mcg total) by nebulization every 6 (six) hours. 12/16/16 01/15/19  [provider]  KOMBIGLYZE XR 2.04-999 MG TB24 Take 1 tablet by mouth 2 (two) times daily. 01/17/16   [provider]  metoprolol succinate (TOPROL-XL) 50 MG 24 hr tablet Take 50 mg by mouth daily. 02/24/20   [provider]   pantoprazole (PROTONIX) 40 MG tablet Take 40 mg by mouth daily. 12/18/15   [provider]  simvastatin (ZOCOR) 40 MG tablet Take 1 tablet by mouth daily at 6 PM. 01/17/16   [provider]  TRELEGY ELLIPTA 100-62.5-25 MCG/INH AEPB Inhale 1 puff into the lungs daily. 12/31/19   [provider]    Allergies Bee venom and Hydrocodone-acetaminophen  Family History  Problem Relation Age of Onset  . Hypertension Mother     Social History Social History   Tobacco Use  . Smoking status: Former Smoker    Packs/day: 0.50    Types: Cigarettes    Start date: 2018  . Smokeless tobacco: Never Used  Vaping Use  . Vaping Use: Never used  Substance Use Topics  . Alcohol use: Yes    Alcohol/week: 1.0 standard drink    Types: 1 Cans of beer per week    Comment: occ  .  Drug use: No    Review of Systems Constitutional: No fever/chills Eyes: No visual changes. ENT: No sore throat. Cardiovascular: Denies chest pain. Respiratory: Positive for shortness of breath. Gastrointestinal: No abdominal pain.  No nausea, no vomiting.  No diarrhea.   Genitourinary: Negative for dysuria. Musculoskeletal: Negative for back pain. Skin: Negative for rash. Neurological: Negative for headaches, focal weakness or numbness.  ____________________________________________   PHYSICAL EXAM:  VITAL SIGNS: ED Triage Vitals  Enc Vitals Group     BP 11/12/20 2102 117/76     Pulse Rate 11/12/20 2102 (!) 155     Resp 11/12/20 2102 (!) 24     Temp 11/12/20 2102 (!) 100.4 F (38 C)     Temp Source 11/12/20 2102 Oral     SpO2 11/12/20 2056 91 %     Weight 11/12/20 2103 156 lb (70.8 kg)     Height 11/12/20 2103 5\' 8"  (1.727 m)     Head Circumference --      Peak Flow --      Pain Score 11/12/20 2103 0   Constitutional: Alert and oriented.  Eyes: Conjunctivae are normal.  ENT      Head: Normocephalic and atraumatic.      Nose: No congestion/rhinnorhea.      Mouth/Throat: Mucous  membranes are moist.      Neck: No stridor. Hematological/Lymphatic/Immunilogical: No cervical lymphadenopathy. Cardiovascular: Tachycardic. Irregular rhythm.  Respiratory: Increased respiratory effort. Diffuse expiratory wheezing. Gastrointestinal: Soft and non tender. No rebound. No guarding.  Genitourinary: Deferred Musculoskeletal: Normal range of motion in all extremities. No lower extremity edema. Neurologic:  Normal speech and language. No gross focal neurologic deficits are appreciated.  Skin:  Skin is warm, dry and intact. No rash noted. Psychiatric: Mood and affect are normal. Speech and behavior are normal. Patient exhibits appropriate insight and judgment.  ____________________________________________    LABS (pertinent positives/negatives)  COVID negative Lactic acid 1.7 CMP wnl except k 3.3, glu 191, ca 8.7, alb 2.9 CBC wbc 52.6, hgb 10.8, plt 583  ____________________________________________   EKG  I, Nance Pear, attending physician, personally viewed and interpreted this EKG  EKG Time: 2118 Rate: 148 Rhythm: sinus tachycardia Axis: normal Intervals: qtc 574 QRS: narrow, q waves v1, v2, v3 ST changes: no st elevation Impression: abnormal ekg ____________________________________________    RADIOLOGY  CXR Multifocal bilateral airspace disease  ____________________________________________   PROCEDURES  Procedures  CRITICAL CARE Performed by: Nance Pear   Total critical care time: 35 minutes  Critical care time was exclusive of separately billable procedures and treating other patients.  Critical care was necessary to treat or prevent imminent or life-threatening deterioration.  Critical care was time spent personally by me on the following activities: development of treatment plan with patient and/or surrogate as well as nursing, discussions with consultants, evaluation of patient's response to treatment, examination of patient,  obtaining history from patient or surrogate, ordering and performing treatments and interventions, ordering and review of laboratory studies, ordering and review of radiographic studies, pulse oximetry and re-evaluation of patient's condition.  ____________________________________________   INITIAL IMPRESSION / ASSESSMENT AND PLAN / ED COURSE  Pertinent labs & imaging results that were available during my care of the patient were reviewed by me and considered in my medical decision making (see chart for details).   Patient presented to the emergency department today because of concerns for shortness of breath that has been getting worse over the past few days.  On initial exam patient was in significant  respiratory distress.  He personally had went up on his home oxygen.  Shortly after arrival to the emergency department the decision was made to put him on BiPAP.  He did get significant improvement in terms of his effort and felt clinically better after being placed on BiPAP.  Patient was also found to be quite tachycardic.  Blood work was concerning for significant leukocytosis and chest x-ray was concerning for pneumonia.  Covid negative.  I discussed these findings with the patient.  Did start antibiotics for pneumonia.  Patient will need admission to the hospital.  ____________________________________________   FINAL CLINICAL IMPRESSION(S) / ED DIAGNOSES  Final diagnoses:  Respiratory distress  Pneumonia due to infectious organism, unspecified laterality, unspecified part of lung     Note: This dictation was prepared with Dragon dictation. Any transcriptional errors that result from this process are unintentional     Nance Pear, MD 11/12/20 2223

## 2020-11-13 ENCOUNTER — Inpatient Hospital Stay: Payer: Medicare HMO

## 2020-11-13 DIAGNOSIS — R6520 Severe sepsis without septic shock: Secondary | ICD-10-CM

## 2020-11-13 DIAGNOSIS — A419 Sepsis, unspecified organism: Secondary | ICD-10-CM

## 2020-11-13 LAB — BASIC METABOLIC PANEL
Anion gap: 14 (ref 5–15)
BUN: 15 mg/dL (ref 8–23)
CO2: 27 mmol/L (ref 22–32)
Calcium: 9 mg/dL (ref 8.9–10.3)
Chloride: 97 mmol/L — ABNORMAL LOW (ref 98–111)
Creatinine, Ser: 1.2 mg/dL (ref 0.61–1.24)
GFR, Estimated: >60 mL/min (ref >60)
Glucose, Bld: 245 mg/dL — ABNORMAL HIGH (ref 70–99)
Potassium: 4.2 mmol/L (ref 3.5–5.1)
Sodium: 138 mmol/L (ref 135–145)

## 2020-11-13 LAB — CBC
HCT: 33.3 % — ABNORMAL LOW (ref 39.0–52.0)
Hemoglobin: 10.1 g/dL — ABNORMAL LOW (ref 13.0–17.0)
MCH: 28.6 pg (ref 26.0–34.0)
MCHC: 30.3 g/dL (ref 30.0–36.0)
MCV: 94.3 fL (ref 80.0–100.0)
Platelets: 480 10*3/uL — ABNORMAL HIGH (ref 150–400)
RBC: 3.53 MIL/uL — ABNORMAL LOW (ref 4.22–5.81)
RDW: 15.6 % — ABNORMAL HIGH (ref 11.5–15.5)
WBC: 51.2 10*3/uL (ref 4.0–10.5)
nRBC: 0 % (ref 0.0–0.2)

## 2020-11-13 LAB — URINALYSIS, COMPLETE (UACMP) WITH MICROSCOPIC
Bacteria, UA: NONE SEEN
Bilirubin Urine: NEGATIVE
Glucose, UA: >=500 mg/dL — AB
Hgb urine dipstick: NEGATIVE
Ketones, ur: NEGATIVE mg/dL
Leukocytes,Ua: NEGATIVE
Nitrite: NEGATIVE
Protein, ur: 30 mg/dL — AB
Specific Gravity, Urine: 1.011 (ref 1.005–1.030)
Squamous Epithelial / HPF: NONE SEEN (ref 0–5)
pH: 5 (ref 5.0–8.0)

## 2020-11-13 LAB — HEMOGLOBIN A1C
Hgb A1c MFr Bld: 6 % — ABNORMAL HIGH (ref 4.8–5.6)
Mean Plasma Glucose: 125.5 mg/dL

## 2020-11-13 LAB — GLUCOSE, CAPILLARY
Glucose-Capillary: 126 mg/dL — ABNORMAL HIGH (ref 70–99)
Glucose-Capillary: 149 mg/dL — ABNORMAL HIGH (ref 70–99)
Glucose-Capillary: 174 mg/dL — ABNORMAL HIGH (ref 70–99)
Glucose-Capillary: 190 mg/dL — ABNORMAL HIGH (ref 70–99)
Glucose-Capillary: 191 mg/dL — ABNORMAL HIGH (ref 70–99)

## 2020-11-13 LAB — LACTIC ACID, PLASMA
Lactic Acid, Venous: 1.5 mmol/L (ref 0.5–1.9)
Lactic Acid, Venous: 1.6 mmol/L (ref 0.5–1.9)

## 2020-11-13 LAB — PROCALCITONIN: Procalcitonin: 14.16 ng/mL

## 2020-11-13 LAB — STREP PNEUMONIAE URINARY ANTIGEN: Strep Pneumo Urinary Antigen: NEGATIVE

## 2020-11-13 LAB — MRSA PCR SCREENING: MRSA by PCR: POSITIVE — AB

## 2020-11-13 MED ORDER — METHYLPREDNISOLONE SODIUM SUCC 40 MG IJ SOLR
40.0000 mg | Freq: Two times a day (BID) | INTRAMUSCULAR | Status: AC
  Administered 2020-11-13 (×2): 40 mg via INTRAVENOUS
  Filled 2020-11-13 (×2): qty 1

## 2020-11-13 MED ORDER — MUPIROCIN 2 % EX OINT
1.0000 "application " | TOPICAL_OINTMENT | Freq: Two times a day (BID) | CUTANEOUS | Status: AC
  Administered 2020-11-13 – 2020-11-17 (×10): 1 via NASAL
  Filled 2020-11-13: qty 22

## 2020-11-13 MED ORDER — BUPRENORPHINE HCL-NALOXONE HCL 8-2 MG SL SUBL
1.0000 | SUBLINGUAL_TABLET | Freq: Three times a day (TID) | SUBLINGUAL | Status: DC
  Administered 2020-11-13 – 2020-11-18 (×15): 1 via SUBLINGUAL
  Filled 2020-11-13 (×15): qty 1

## 2020-11-13 MED ORDER — CHLORHEXIDINE GLUCONATE CLOTH 2 % EX PADS: 6.0000 | MEDICATED_PAD | Freq: Every day | CUTANEOUS | Status: AC

## 2020-11-13 MED ORDER — VANCOMYCIN HCL 750 MG/150ML IV SOLN
750.0000 mg | Freq: Two times a day (BID) | INTRAVENOUS | Status: AC
  Administered 2020-11-14 – 2020-11-17 (×8): 750 mg via INTRAVENOUS
  Filled 2020-11-13 (×9): qty 150

## 2020-11-13 MED ORDER — FLUTICASONE FUROATE-VILANTEROL 100-25 MCG/INH IN AEPB
1.0000 | INHALATION_SPRAY | Freq: Every day | RESPIRATORY_TRACT | Status: DC
  Administered 2020-11-13 – 2020-11-18 (×6): 1 via RESPIRATORY_TRACT
  Filled 2020-11-13: qty 28

## 2020-11-13 MED ORDER — UMECLIDINIUM BROMIDE 62.5 MCG/INH IN AEPB
1.0000 | INHALATION_SPRAY | Freq: Every day | RESPIRATORY_TRACT | Status: DC
  Administered 2020-11-13 – 2020-11-18 (×6): 1 via RESPIRATORY_TRACT
  Filled 2020-11-13: qty 7

## 2020-11-13 MED ORDER — VANCOMYCIN HCL 1750 MG/350ML IV SOLN
1750.0000 mg | Freq: Once | INTRAVENOUS | Status: AC
  Administered 2020-11-13: 1750 mg via INTRAVENOUS
  Filled 2020-11-13 (×2): qty 350

## 2020-11-13 MED ORDER — ACETAMINOPHEN 325 MG PO TABS
650.0000 mg | ORAL_TABLET | Freq: Four times a day (QID) | ORAL | Status: DC | PRN
  Administered 2020-11-13 – 2020-11-15 (×2): 650 mg via ORAL
  Filled 2020-11-13 (×2): qty 2

## 2020-11-13 MED ORDER — BUPRENORPHINE HCL-NALOXONE HCL 2-0.5 MG SL SUBL
2.0000 | SUBLINGUAL_TABLET | Freq: Three times a day (TID) | SUBLINGUAL | Status: DC
  Administered 2020-11-13: 2 via SUBLINGUAL
  Filled 2020-11-13: qty 2

## 2020-11-13 NOTE — Plan of Care (Signed)

## 2020-11-13 NOTE — Consult Note (Signed)
Pulmonary Medicine          Date: 11/13/2020,   MRN# [id number] Christian Fields 1956-05-11     AdmissionWeight: 70.8 kg                 CurrentWeight: 70.8 kg   Referring physician: Dr. 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 He shares worsening shortness of breath increased O2 requirement and phlegm production. He received steroids and nebulizer therapy and shares that he has improved. On admission he was found to be febrile tachypneic and tachycardic with wheezing bilaterally he was given a liter of fluid as well as typical community-acquired pneumonia therapy with Zithromax and Rocephin. During my evaluation patient states that he is improved slightly however remains on 5 L/min nasal cannula supplemental oxygen. He has elevated leukocytosis at 51,000.     PAST MEDICAL HISTORY   Past Medical History:  Diagnosis Date  . Anemia   . Anxiety   . Arthritis   . Asthma   . Cancer ([place])    Basal Cell Skin Cancer  . Chronic back pain   . COPD (chronic obstructive pulmonary disease) ([place])   . Depression   . Diabetes mellitus ([place])   . Dyspnea   . GERD (gastroesophageal reflux disease)   . Gout   . Gout   . Headache   . History of blood clots    Left Leg--July 2018  . History of kidney stones   . Hyperlipidemia   . Hyperlipidemia   . Hypertension   . Kidney stones   . Neuropathy   . On home oxygen therapy    2 L / M  . Pneumonia 06/2017  . Sleep apnea   . Ulcer of foot ([place])    Right     SURGICAL HISTORY   Past Surgical History:  Procedure  Laterality Date  . APPENDECTOMY    . DG FEET 2 VIEWS BILAT    . LIPOMA EXCISION Right 08/15/2017   Procedure: EXCISION TUMOR(CYST) FOOT;  Surgeon: Albertine Patricia, DPM;  Location: [facility] ORS;  Service: Podiatry;  Laterality: Right;  . OTHER SURGICAL HISTORY Bilateral Foot surgery     FAMILY HISTORY   Family History  Problem Relation Age of Onset  . Hypertension Mother      SOCIAL HISTORY   Social History   Tobacco Use  . Smoking status: Former Smoker    Packs/day: 0.50    Types: Cigarettes    Start date: 2018  . Smokeless tobacco: Never Used  Vaping Use  . Vaping Use: Never used  Substance Use Topics  . Alcohol use: Yes    Alcohol/week: 1.0 standard drink    Types: 1 Cans of beer per week    Comment: occ  . Drug use: No     MEDICATIONS    Home Medication:    Current Medication:  Current Facility-Administered Medications:  .  acetaminophen (TYLENOL) tablet 650 mg, 650 mg, Oral, Q6H PRN, Norins, Heinz Knuckles, MD .  allopurinol (ZYLOPRIM) tablet 600 mg, 600 mg, Oral, BID, Norins, Legrand Como  E, MD, 600 mg at 11/13/20 0930 .  aspirin EC tablet 81 mg, 81 mg, Oral, Daily, Norins, Heinz Knuckles, MD, 81 mg at 11/13/20 1950 .  azithromycin (ZITHROMAX) tablet 500 mg, 500 mg, Oral, Daily, Norins, Heinz Knuckles, MD, 500 mg at 11/13/20 0929 .  cefTRIAXone (ROCEPHIN) 2 g in sodium chloride 0.9 % 100 mL IVPB, 2 g, Intravenous, Q24H, Norins, Heinz Knuckles, MD .  Chlorhexidine Gluconate Cloth 2 % PADS 6 each, 6 each, Topical, Q0600, Sharion Settler, NP .  cholecalciferol (VITAMIN D3) tablet 1,000 Units, 1,000 Units, Oral, Daily, Norins, Heinz Knuckles, MD, 1,000 Units at 11/13/20 0929 .  clopidogrel (PLAVIX) tablet 75 mg, 75 mg, Oral, Daily, Norins, Heinz Knuckles, MD, 75 mg at 11/13/20 0929 .  DULoxetine (CYMBALTA) DR capsule 30 mg, 30 mg, Oral, Daily, Norins, Heinz Knuckles, MD, 30 mg at 11/13/20 0929 .  enoxaparin (LOVENOX) injection 40 mg, 40 mg, Subcutaneous, Q24H, Norins, Heinz Knuckles, MD .  ezetimibe (ZETIA)  tablet 10 mg, 10 mg, Oral, Daily, Norins, Heinz Knuckles, MD, 10 mg at 11/13/20 0929 .  ferrous sulfate tablet 325 mg, 325 mg, Oral, Q breakfast, Norins, Heinz Knuckles, MD, 325 mg at 11/13/20 0811 .  fluticasone (FLONASE) 50 MCG/ACT nasal spray 2 spray, 2 spray, Each Nare, Daily, Norins, Heinz Knuckles, MD, 2 spray at 11/13/20 [phone] .  fluticasone furoate-vilanterol (BREO ELLIPTA) 100-25 MCG/INH 1 puff, 1 puff, Inhalation, Daily, 1 puff at 11/13/20 0811 **AND** umeclidinium bromide (INCRUSE ELLIPTA) 62.5 MCG/INH 1 puff, 1 puff, Inhalation, Daily, Norins, Heinz Knuckles, MD, 1 puff at 11/13/20 [phone] .  gabapentin (NEURONTIN) capsule 800 mg, 800 mg, Oral, QID, Norins, Heinz Knuckles, MD, 800 mg at 11/13/20 1357 .  hydrochlorothiazide (HYDRODIURIL) tablet 25 mg, 25 mg, Oral, Daily, Norins, Heinz Knuckles, MD, 25 mg at 11/13/20 0929 .  insulin aspart (novoLOG) injection 0-20 Units, 0-20 Units, Subcutaneous, TID WC, Norins, Heinz Knuckles, MD, 4 Units at 11/13/20 1136 .  ipratropium-albuterol (DUONEB) 0.5-2.5 (3) MG/3ML nebulizer solution 3 mL, 3 mL, Nebulization, Q6H, Norins, Heinz Knuckles, MD, 3 mL at 11/13/20 1336 .  ketorolac (TORADOL) 30 MG/ML injection 15 mg, 15 mg, Intravenous, Q6H PRN, Norins, Heinz Knuckles, MD, 15 mg at 11/13/20 1041 .  linagliptin (TRADJENTA) tablet 5 mg, 5 mg, Oral, Daily, Norins, Heinz Knuckles, MD, 5 mg at 11/13/20 1035 .  metFORMIN (GLUCOPHAGE-XR) 24 hr tablet 1,000 mg, 1,000 mg, Oral, BID WC, Norins, Heinz Knuckles, MD, 1,000 mg at 11/13/20 1034 .  metoprolol succinate (TOPROL-XL) 24 hr tablet 50 mg, 50 mg, Oral, Daily, Norins, Heinz Knuckles, MD, 50 mg at 11/13/20 0930 .  mupirocin ointment (BACTROBAN) 2 % 1 application, 1 application, Nasal, BID, Sharion Settler, NP, 1 application at 58/09/98 0932 .  pantoprazole (PROTONIX) EC tablet 40 mg, 40 mg, Oral, Daily, Norins, Heinz Knuckles, MD, 40 mg at 11/13/20 0929 .  simvastatin (ZOCOR) tablet 40 mg, 40 mg, Oral, q1800, Norins, Heinz Knuckles, MD .  traZODone (DESYREL) tablet 25 mg, 25 mg,  Oral, QHS PRN, Norins, Heinz Knuckles, MD    ALLERGIES   Bee venom and Hydrocodone-acetaminophen     REVIEW OF SYSTEMS    Review of Systems:  Gen:  Denies  fever, sweats, chills weigh loss  HEENT: Denies blurred vision, double vision, ear pain, eye pain, hearing loss, nose bleeds, sore throat Cardiac:  No dizziness, chest pain or heaviness, chest tightness,edema Resp:   D cough, phlegm production and wheezing bilateral Gi: Denies swallowing difficulty, stomach pain, nausea or vomiting, diarrhea, constipation,  bowel incontinence Gu:  Denies bladder incontinence, burning urine Ext:   Denies Joint pain, stiffness or swelling Skin: Denies  skin rash, easy bruising or bleeding or hives Endoc:  Denies polyuria, polydipsia , polyphagia or weight change Psych:   Denies depression, insomnia or hallucinations   Other:  All other systems negative   VS: BP 136/78   Pulse 92   Temp (!) 96.3 F (35.7 C) (Oral)   Resp 17   Ht 5\' 8"  (1.727 m)   Wt 70.8 kg   SpO2 91%   BMI 23.72 kg/m      PHYSICAL EXAM    GENERAL:NAD, no fevers, chills, no weakness no fatigue HEAD: Normocephalic, atraumatic.  EYES: Pupils equal, round, reactive to light. Extraocular muscles intact. No scleral icterus.  MOUTH: Moist mucosal membrane. Dentition intact. No abscess noted.  EAR, NOSE, THROAT: Clear without exudates. No external lesions.  NECK: Supple. No thyromegaly. No nodules. No JVD.  PULMONARY: Bilateral wheezing and rhonchorous breath sound CARDIOVASCULAR: S1 and S2. Regular rate and rhythm. No murmurs, rubs, or gallops. No edema. Pedal pulses 2+ bilaterally.  GASTROINTESTINAL: Soft, nontender, nondistended. No masses. Positive bowel sounds. No hepatosplenomegaly.  MUSCULOSKELETAL: No swelling, clubbing, or edema. Range of motion full in all extremities.  NEUROLOGIC: Cranial nerves II through XII are intact. No gross focal neurological deficits. Sensation intact. Reflexes intact.  SKIN: No  ulceration, lesions, rashes, or cyanosis. Skin warm and dry. Turgor intact.  PSYCHIATRIC: Mood, affect within normal limits. The patient is awake, alert and oriented x 3. Insight, judgment intact.       IMAGING    Portable chest 1 View  Result Date: 11/13/2020 CLINICAL DATA:  Pneumonia. EXAM: PORTABLE CHEST 1 VIEW COMPARISON:  Chest x-ray 11/12/2020 FINDINGS: The cardiac silhouette, mediastinal and hilar contours are within normal limits and stable. Stable underlying emphysematous changes. Persistent interstitial and airspace process in the lungs. No pleural effusions. IMPRESSION: Persistent interstitial and airspace process in the lungs. Electronically Signed   By: Marijo Sanes M.D.   On: 11/13/2020 07:38   DG Chest Port 1 View  Result Date: 11/12/2020 CLINICAL DATA:  Respiratory distress, COPD, sepsis EXAM: PORTABLE CHEST 1 VIEW COMPARISON:  03/25/2020 FINDINGS: 2 frontal views of the chest demonstrate a stable cardiac silhouette. There is extensive background scarring and fibrosis compatible with emphysema. Multifocal areas of airspace disease are identified consistent with superimposed pneumonia. This is most pronounced within the suprahilar regions. The left upper lobe nodule previously evaluated by PET scan again noted over the left anterior fifth rib. No effusion or pneumothorax. No acute bony abnormalities. IMPRESSION: 1. Multifocal bilateral airspace disease concerning for edema or infection superimposed upon background emphysema. 2. Persistent left upper lobe nodule previously evaluated by CT and PET scan. Electronically Signed   By: Randa Ngo M.D.   On: 11/12/2020 21:42      ASSESSMENT/PLAN    Acute on chronic hypoxemic respiratory failure    -Due to community-acquired pneumonia with acute exacerbation of COPD secondary to PNA    -Treat underlying cause with typical COPD care path   Sepsis secondary to community-acquired pneumonia -Present on admission secondary to  pneumonia, lactate was elevated  -Interval development of right lower lobe infiltrate as well as peripheral base interstitial opacification  -Leukocytosis is elevated out of proportion to clinical exam and radiographic findings even with steroids  -MRSA PCR is positive will change to vancomycin with pharmacy consult we will keep Rocephin and Zithromax for now  -Patient continues to require  5 L/min nasal cannula at this time  -Additional studies including Fungitell, Legionella, histoplasma urinary antigen, strep pneumo urinary antigen, Aspergillus serology, sputum respiratory cultures and AFB -Patient continues to have bilateral wheezing, agree with Solu-Medrol 40 twice daily at this time   -Severe acute exacerbation of COPD  -Continue chest physiotherapy and weaning of oxygen-currently at 5 L/min nasal cannula  -Occupational physical therapy to mobilize patient when able  -Solu-Medrol 40 twice daily -DuoNeb every 6 hours while awake -will need to initiate Roflumilast on outpatient basis due to chronic bronchitic phenotype of COPD     Thank you for allowing me to participate in the care of this patient.    Patient/Family are satisfied with care plan and all questions have been answered.   This document was prepared using Dragon voice recognition software and may include unintentional dictation errors.     Ottie Glazier, M.D.  Div[facility] [place]

## 2020-11-13 NOTE — Consult Note (Signed)
Pharmacy Antibiotic Note  Christian Fields is a 64 y.o. male with medical history including emphysema on home oxygen, NSCLC s/p XRT admitted on 11/12/2020 with pneumonia. Labs on admission notable for marked leukocytosis with WBC 52.6 with associated left shift on differential, PCT 5.68. Vitals notable for low grade fever and ST. Patient was started on empiric antibiotic therapy with ceftriaxone + azithromycin.  Pharmacy has been consulted for vancomycin dosing.  Plan: Vancomycin 1.75 g IV LD x 1 followed by maintenance regimen of 750 mg IV q12h per nomogram --Goal trough 15-20 mcg/mL for pneumonia --Levels at steady state as indicated --Daily Scr per protocol  Height: 5\' 8"  (172.7 cm) Weight: 70.8 kg (156 lb) IBW/kg (Calculated) : 68.4  Temp (24hrs), Avg:97.9 F (36.6 C), Min:96.3 F (35.7 C), Max:100.4 F (38 C)  Recent Labs  Lab 11/12/20 2108 11/12/20 2314 11/13/20 0533  WBC 52.6*  --  51.2*  CREATININE 1.08  --  1.20  LATICACIDVEN 1.7 2.4*  --     Estimated Creatinine Clearance: 59.4 mL/min (by C-G formula based on SCr of 1.2 mg/dL).    Allergies  Allergen Reactions  . Bee Venom Anaphylaxis  . Hydrocodone-Acetaminophen Itching    Antimicrobials this admission: Ceftriaxone 12/5 >>  Azithromycin 12/5 >>  Vancomycin 12/6 >>   Dose adjustments this admission: N/A  Microbiology results: 12/5 BCx: NGTD 12/6 Sputum: pending  12/6 MRSA PCR: (+)  Thank you for allowing pharmacy to be a part of this patient's care.  Benita Gutter 11/13/2020 4:15 PM

## 2020-11-13 NOTE — Progress Notes (Signed)
Pt expressed need for Suboxone d/t pain in his lower extremities unrelieved by gabapentin. Dr. Fritzi Mandes was aware of the pt's need for suboxone and asked pt to provide her with the prescribing physician. Information regarding prescribing MD was obtained by pt's daughter, and was relayed to Dr. Posey Pronto. Due to pt's increasing need, the pt called the daughter to bring his own suboxone into the hospital. I told the pt that home medication was not to be brought into the hospital, and he was not allowed to take medication given to him by his daughter while hospitalized. He continued to tell the daughter to bring it to the hospital regardless of the rules. Dr. Posey Pronto was made aware of the situation, and suboxone was ordered in a timely manner. ICU secretary notified the security/front desk to not allow the daughter to visit this evening due to the risk of her bringing pain medication to the pt.

## 2020-11-13 NOTE — Progress Notes (Signed)
[facility] [place] NAME: Christian Fields    MR#:  [id number]  DATE OF BIRTH:  [birth year]  SUBJECTIVE:   Came in with increasing shortness of breath and increase oxygen requirement. Low-grade fever. Feels better.wantshis Suboxone REVIEW OF SYSTEMS:   Review of Systems  Constitutional: Positive for malaise/fatigue. Negative for chills, fever and weight loss.  HENT: Negative for ear discharge, ear pain and nosebleeds.   Eyes: Negative for blurred vision, pain and discharge.  Respiratory: Positive for cough and shortness of breath. Negative for sputum production, wheezing and stridor.   Cardiovascular: Negative for chest pain, palpitations, orthopnea and PND.  Gastrointestinal: Negative for abdominal pain, diarrhea, nausea and vomiting.  Genitourinary: Negative for frequency and urgency.  Musculoskeletal: Negative for back pain and joint pain.  Neurological: Positive for weakness. Negative for sensory change, speech change and focal weakness.  Psychiatric/Behavioral: Negative for depression and hallucinations. The patient is not nervous/anxious.    Tolerating Diet:yes Tolerating PT:   DRUG ALLERGIES:   Allergies  Allergen Reactions  . Bee Venom Anaphylaxis  . Hydrocodone-Acetaminophen Itching    VITALS:  Blood pressure 121/62, pulse 91, temperature (!) 96.9 F (36.1 C), temperature source Axillary, resp. rate (!) 21, height 5\' 8"  (1.727 m), weight 70.8 kg, SpO2 93 %.  PHYSICAL EXAMINATION:   Physical Exam  GENERAL:  64 y.o.-year-old patient lying in the bed with no acute distress. Looks chronically ill HEENT: Head atraumatic, normocephalic. Oropharynx and nasopharynx clear.  NECK:  Supple, no jugular venous distention. No thyroid enlargement, no tenderness.  LUNGS: coarse breath sounds bilaterally, no wheezing, rales, rhonchi. No use of accessory muscles of respiration.  CARDIOVASCULAR: S1, S2 normal. No murmurs, rubs, or gallops.   ABDOMEN: Soft, nontender, nondistended. Bowel sounds present. No organomegaly or mass.  EXTREMITIES: No cyanosis, clubbing or edema b/l.    NEUROLOGIC: Cranial nerves II through XII are intact. No focal Motor or sensory deficits b/l.   PSYCHIATRIC:  patient is alert and oriented x 3.  SKIN: No obvious rash, lesion, or ulcer.   LABORATORY PANEL:  CBC Recent Labs  Lab 11/13/20 0533  WBC 51.2*  HGB 10.1*  HCT 33.3*  PLT 480*    Chemistries  Recent Labs  Lab 11/12/20 2108 11/12/20 2108 11/13/20 0533  NA 137   < > 138  K 3.3*   < > 4.2  CL 100   < > 97*  CO2 26   < > 27  GLUCOSE 191*   < > 245*  BUN 11   < > 15  CREATININE 1.08   < > 1.20  CALCIUM 8.7*   < > 9.0  AST 15  --   --   ALT 12  --   --   ALKPHOS 108  --   --   BILITOT 0.5  --   --    < > = values in this interval not displayed.   Cardiac Enzymes No results for input(s): TROPONINI in the last 168 hours. RADIOLOGY:  Portable chest 1 View  Result Date: 11/13/2020 CLINICAL DATA:  Pneumonia. EXAM: PORTABLE CHEST 1 VIEW COMPARISON:  Chest x-ray 11/12/2020 FINDINGS: The cardiac silhouette, mediastinal and hilar contours are within normal limits and stable. Stable underlying emphysematous changes. Persistent interstitial and airspace process in the lungs. No pleural effusions. IMPRESSION: Persistent interstitial and airspace process in the lungs. Electronically Signed   By: Marijo Sanes M.D.   On: 11/13/2020 07:38   DG Chest  Port 1 View  Result Date: 11/12/2020 CLINICAL DATA:  Respiratory distress, COPD, sepsis EXAM: PORTABLE CHEST 1 VIEW COMPARISON:  03/25/2020 FINDINGS: 2 frontal views of the chest demonstrate a stable cardiac silhouette. There is extensive background scarring and fibrosis compatible with emphysema. Multifocal areas of airspace disease are identified consistent with superimposed pneumonia. This is most pronounced within the suprahilar regions. The left upper lobe nodule previously evaluated by PET  scan again noted over the left anterior fifth rib. No effusion or pneumothorax. No acute bony abnormalities. IMPRESSION: 1. Multifocal bilateral airspace disease concerning for edema or infection superimposed upon background emphysema. 2. Persistent left upper lobe nodule previously evaluated by CT and PET scan. Electronically Signed   By: Randa Ngo M.D.   On: 11/12/2020 21:42   ASSESSMENT AND PLAN:  Christian Fields is a 64 y.o. 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 He was admitted in April '21 for CAP. Due to his progressive SOB he called EMS. In route he was given Nebulizer treatment and IV steroids for his COPD symptoms.  Severe sepsis present on admission -- patient came in with fever shortness of breath and chest x-ray revealed multifocal airspace disease consistent with pneumonia -- fever of 100.4, tachycardia, tachypnea, lactic acid  2.2 -- M RSA PCR positive -- continue vancomycin and IV Rocephin -- pulmonary consultation with Dr Lanney Gins (patient follows with Dr. 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 Donella Stade -- primary oncologist Dr. Janese Banks  Chronic narcotic dependence -- patient on Suboxone-- follows withDr. Jeanice Lim -- will resume equivalent dose  COPD chronic/end-stage emphysema/chronic respiratory failure/chronic oxygen -- continue nebulizer treatment -- continue oxygen -- Solu-Medrol  Type II diabetes, stable sugars -- last A1c 5.4 in April 2021 -- continue home meds  Hypertension continue beta-blockers and  hydrochlorothiazide  Hyperlipidemia continue statins  H/o Gout  continue allopurinol  Procedures:none Family communication : Consults :Pulmonary CODE STATUS: FULL DVT Prophylaxis :lovenox  Status is: Inpatient  Remains inpatient appropriate because:Inpatient level of care appropriate due to severity of illness   Dispo: The patient is from: Home              Anticipated d/c is to: Home              Anticipated d/c date is: 2 days              Patient currently is not medically stable to d/c.        TOTAL TIME TAKING CARE OF THIS PATIENT: 25 minutes.  >50% time spent on counselling and coordination of care  Note: This dictation was prepared with Dragon dictation along with smaller phrase technology. Any transcriptional errors that result from this process are unintentional.  Fritzi Mandes M.D    [facility]   CC: Primary care physician; Remi Haggard, FNPPatient ID: Genella Mech, male   DOB: 1956-02-22, 64 y.o.   MRN: [id number]

## 2020-11-14 LAB — GLUCOSE, CAPILLARY
Glucose-Capillary: 142 mg/dL — ABNORMAL HIGH (ref 70–99)
Glucose-Capillary: 154 mg/dL — ABNORMAL HIGH (ref 70–99)
Glucose-Capillary: 121 mg/dL — ABNORMAL HIGH (ref 70–99)
Glucose-Capillary: 116 mg/dL — ABNORMAL HIGH (ref 70–99)

## 2020-11-14 LAB — CREATININE, SERUM
Creatinine, Ser: 1.27 mg/dL — ABNORMAL HIGH (ref 0.61–1.24)
GFR, Estimated: >60 mL/min (ref >60)

## 2020-11-14 LAB — PROCALCITONIN: Procalcitonin: 14.16 ng/mL

## 2020-11-14 NOTE — Progress Notes (Signed)
[facility] [place] NAME: Christian Fields    MR#:  [id number]  DATE OF BIRTH:  Aug 22, 1956  SUBJECTIVE:   Came in with increasing shortness of breath and increase oxygen requirement. Low-grade fever.   Patient moved out of the ICU last evening. Looks and feels a lot better. Started back on his home dose of Suboxone REVIEW OF SYSTEMS:   Review of Systems  Constitutional: Positive for malaise/fatigue. Negative for chills, fever and weight loss.  HENT: Negative for ear discharge, ear pain and nosebleeds.   Eyes: Negative for blurred vision, pain and discharge.  Respiratory: Positive for cough and shortness of breath. Negative for sputum production, wheezing and stridor.   Cardiovascular: Negative for chest pain, palpitations, orthopnea and PND.  Gastrointestinal: Negative for abdominal pain, diarrhea, nausea and vomiting.  Genitourinary: Negative for frequency and urgency.  Musculoskeletal: Negative for back pain and joint pain.  Neurological: Positive for weakness. Negative for sensory change, speech change and focal weakness.  Psychiatric/Behavioral: Negative for depression and hallucinations. The patient is not nervous/anxious.    Tolerating Diet:yes Tolerating PT:   DRUG ALLERGIES:   Allergies  Allergen Reactions  . Bee Venom Anaphylaxis  . Hydrocodone-Acetaminophen Itching    VITALS:  Blood pressure 115/77, pulse 80, temperature (!) 97.5 F (36.4 C), temperature source Oral, resp. rate 17, height 5\' 8"  (1.727 m), weight 78.6 kg, SpO2 100 %.  PHYSICAL EXAMINATION:   Physical Exam  GENERAL:  64 y.o.-year-old patient lying in the bed with no acute distress. Looks chronically ill HEENT: Head atraumatic, normocephalic. Oropharynx and nasopharynx clear.  LUNGS: coarse breath sounds bilaterally, no wheezing, rales, rhonchi. No use of accessory muscles of respiration.  CARDIOVASCULAR: S1, S2 normal. No murmurs, rubs, or gallops.   ABDOMEN: Soft, nontender, nondistended. Bowel sounds present. No organomegaly or mass.  EXTREMITIES: No cyanosis, clubbing or edema b/l.    NEUROLOGIC: Cranial nerves II through XII are intact. No focal Motor or sensory deficits b/l.   PSYCHIATRIC:  patient is alert and oriented x 3.  SKIN: No obvious rash, lesion, or ulcer.   LABORATORY PANEL:  CBC Recent Labs  Lab 11/13/20 0533  WBC 51.2*  HGB 10.1*  HCT 33.3*  PLT 480*    Chemistries  Recent Labs  Lab 11/12/20 2108 11/12/20 2108 11/13/20 0533 11/13/20 0533 11/14/20 0417  NA 137   < > 138  --   --   K 3.3*   < > 4.2  --   --   CL 100   < > 97*  --   --   CO2 26   < > 27  --   --   GLUCOSE 191*   < > 245*  --   --   BUN 11   < > 15  --   --   CREATININE 1.08   < > 1.20   < > 1.27*  CALCIUM 8.7*   < > 9.0  --   --   AST 15  --   --   --   --   ALT 12  --   --   --   --   ALKPHOS 108  --   --   --   --   BILITOT 0.5  --   --   --   --    < > = values in this interval not displayed.   Cardiac Enzymes No results for input(s): TROPONINI in the last  168 hours. RADIOLOGY:  Portable chest 1 View  Result Date: 11/13/2020 CLINICAL DATA:  Pneumonia. EXAM: PORTABLE CHEST 1 VIEW COMPARISON:  Chest x-ray 11/12/2020 FINDINGS: The cardiac silhouette, mediastinal and hilar contours are within normal limits and stable. Stable underlying emphysematous changes. Persistent interstitial and airspace process in the lungs. No pleural effusions. IMPRESSION: Persistent interstitial and airspace process in the lungs. Electronically Signed   By: Marijo Sanes M.D.   On: 11/13/2020 07:38   DG Chest Port 1 View  Result Date: 11/12/2020 CLINICAL DATA:  Respiratory distress, COPD, sepsis EXAM: PORTABLE CHEST 1 VIEW COMPARISON:  03/25/2020 FINDINGS: 2 frontal views of the chest demonstrate a stable cardiac silhouette. There is extensive background scarring and fibrosis compatible with emphysema. Multifocal areas of airspace disease are identified  consistent with superimposed pneumonia. This is most pronounced within the suprahilar regions. The left upper lobe nodule previously evaluated by PET scan again noted over the left anterior fifth rib. No effusion or pneumothorax. No acute bony abnormalities. IMPRESSION: 1. Multifocal bilateral airspace disease concerning for edema or infection superimposed upon background emphysema. 2. Persistent left upper lobe nodule previously evaluated by CT and PET scan. Electronically Signed   By: Randa Ngo M.D.   On: 11/12/2020 21:42   ASSESSMENT AND PLAN:  Christian Fields is a 64 y.o. 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 He was admitted in April '21 for CAP. Due to his progressive SOB he called EMS. In route he was given Nebulizer treatment and IV steroids for his COPD symptoms.  Severe sepsis present on admission -- patient came in with fever shortness of breath and chest x-ray revealed multifocal airspace disease consistent with pneumonia -- fever of 100.4, tachycardia, tachypnea, lactic acid  2.2 -- M RSA PCR positive -- continue vancomycin and IV Rocephin -- pulmonary consultation with Dr Lanney Gins (patient follows with Dr. 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 Donella Stade -- primary oncologist Dr. Janese Banks  Chronic narcotic dependence -- patient on Suboxone-- follows withDr. Jeanice Lim -- will resume equivalent dose  COPD chronic/end-stage emphysema/chronic respiratory failure/chronic oxygen -- continue nebulizer treatment -- continue oxygen --  Solu-Medrol  Type II diabetes, stable sugars -- last A1c 5.4 in April 2021 -- continue home meds  Hypertension continue beta-blockers and hydrochlorothiazide  Hyperlipidemia continue statins  H/o Gout  continue allopurinol  Procedures:none Family communication : daughter Maple Mirza on the phone Consults :Pulmonary dr Lanney Gins CODE STATUS: FULL DVT Prophylaxis :lovenox  Status is: Inpatient  Remains inpatient appropriate because:Inpatient level of care appropriate due to severity of illness   Dispo: The patient is from: Home              Anticipated d/c is to: Home              Anticipated d/c date is: 2 days              Patient currently is not medically stable to d/c.  If continues to show improvement and white count trending down likely discharge home tomorrow with outpatient follow-up pulmonary.  TOTAL TIME TAKING CARE OF THIS PATIENT: 25 minutes.  >50% time spent on counselling and coordination of care  Note: This dictation was prepared with Dragon dictation along with smaller phrase technology. Any transcriptional errors that result from this process are unintentional.  Fritzi Mandes M.D    [facility]   CC: Primary care physician; Remi Haggard, FNPPatient ID: Genella Mech, male   DOB: 12-05-1956, 64 y.o.   MRN: [id number]

## 2020-11-14 NOTE — Progress Notes (Signed)
Pulmonary Medicine          Date: 11/14/2020,   MRN# [id number] Christian Fields 1956/02/16     AdmissionWeight: 70.8 kg                 CurrentWeight: 78.6 kg   Referring physician: Dr. 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 He shares worsening shortness of breath increased O2 requirement and phlegm production. He received steroids and nebulizer therapy and shares that he has improved. On admission he was found to be febrile tachypneic and tachycardic with wheezing bilaterally he was given a liter of fluid as well as typical community-acquired pneumonia therapy with Zithromax and Rocephin. During my evaluation patient states that he is improved slightly however remains on 5 L/min nasal cannula supplemental oxygen. He has elevated leukocytosis at 51,000.     11/14/20- patient reports feeling better.  We reviewed his hospital course and addition of vancomycin due to MRSA +.  He shares he had MRSA pneumonia in the past.  Microbiology, resp cultures and serology has not returned with results yet.     PAST MEDICAL HISTORY   Past Medical History:  Diagnosis Date  . Anemia   . Anxiety   . Arthritis   . Asthma   . Cancer ([place])    Basal Cell Skin Cancer  . Chronic back pain   . COPD (chronic obstructive pulmonary disease) ([place])   . Depression   . Diabetes mellitus ([place])   . Dyspnea   . GERD (gastroesophageal reflux disease)   . Gout   . Gout   . Headache   . History of blood clots    Left Leg--July 2018  . History of kidney stones   . Hyperlipidemia   .  Hyperlipidemia   . Hypertension   . Kidney stones   . Neuropathy   . On home oxygen therapy    2 L / M  . Pneumonia 06/2017  . Sleep apnea   . Ulcer of foot ([place])    Right     SURGICAL HISTORY   Past Surgical History:  Procedure Laterality Date  . APPENDECTOMY    . DG FEET 2 VIEWS BILAT    . LIPOMA EXCISION Right 08/15/2017   Procedure: EXCISION TUMOR(CYST) FOOT;  Surgeon: Albertine Patricia, DPM;  Location: [facility] ORS;  Service: Podiatry;  Laterality: Right;  . OTHER SURGICAL HISTORY Bilateral Foot surgery     FAMILY HISTORY   Family History  Problem Relation Age of Onset  . Hypertension Mother      SOCIAL HISTORY   Social History   Tobacco Use  . Smoking status: Former Smoker    Packs/day: 0.50    Types: Cigarettes    Start date: 2018  . Smokeless tobacco: Never Used  Vaping Use  . Vaping Use: Never used  Substance Use Topics  . Alcohol use: Yes    Alcohol/week: 1.0 standard drink    Types: 1 Cans of beer per week    Comment: occ  . Drug use: No  MEDICATIONS    Home Medication:    Current Medication:  Current Facility-Administered Medications:  .  acetaminophen (TYLENOL) tablet 650 mg, 650 mg, Oral, Q6H PRN, Norins, Heinz Knuckles, MD, 650 mg at 11/13/20 2139 .  allopurinol (ZYLOPRIM) tablet 600 mg, 600 mg, Oral, BID, Norins, Heinz Knuckles, MD, 600 mg at 11/14/20 0918 .  aspirin EC tablet 81 mg, 81 mg, Oral, Daily, Norins, Heinz Knuckles, MD, 81 mg at 11/14/20 6045 .  buprenorphine-naloxone (SUBOXONE) 8-2 mg per SL tablet 1 tablet, 1 tablet, Sublingual, TID, 1 tablet at 11/14/20 1542 **AND** [DISCONTINUED] buprenorphine-naloxone (SUBOXONE) 2-0.5 mg per SL tablet 2 tablet, 2 tablet, Sublingual, TID, Fritzi Mandes, MD, 2 tablet at 11/13/20 2132 .  cefTRIAXone (ROCEPHIN) 2 g in sodium chloride 0.9 % 100 mL IVPB, 2 g, Intravenous, Q24H, Norins, Heinz Knuckles, MD, Last Rate: 200 mL/hr at 11/13/20 2134, 2 g at 11/13/20 2134 .  Chlorhexidine Gluconate Cloth 2 % PADS 6 each, 6  each, Topical, Q0600, Sharion Settler, NP .  cholecalciferol (VITAMIN D3) tablet 1,000 Units, 1,000 Units, Oral, Daily, Norins, Heinz Knuckles, MD, 1,000 Units at 11/14/20 [phone]  clopidogrel (PLAVIX) tablet 75 mg, 75 mg, Oral, Daily, Norins, Heinz Knuckles, MD, 75 mg at 11/14/20 0918 .  DULoxetine (CYMBALTA) DR capsule 30 mg, 30 mg, Oral, Daily, Norins, Heinz Knuckles, MD, 30 mg at 11/14/20 0918 .  enoxaparin (LOVENOX) injection 40 mg, 40 mg, Subcutaneous, Q24H, Norins, Heinz Knuckles, MD, 40 mg at 11/13/20 2139 .  ezetimibe (ZETIA) tablet 10 mg, 10 mg, Oral, Daily, Norins, Heinz Knuckles, MD, 10 mg at 11/14/20 0918 .  ferrous sulfate tablet 325 mg, 325 mg, Oral, Q breakfast, Norins, Heinz Knuckles, MD, 325 mg at 11/14/20 1191 .  fluticasone (FLONASE) 50 MCG/ACT nasal spray 2 spray, 2 spray, Each Nare, Daily, Norins, Heinz Knuckles, MD, 2 spray at 11/14/20 [phone] .  fluticasone furoate-vilanterol (BREO ELLIPTA) 100-25 MCG/INH 1 puff, 1 puff, Inhalation, Daily, 1 puff at 11/14/20 0834 **AND** umeclidinium bromide (INCRUSE ELLIPTA) 62.5 MCG/INH 1 puff, 1 puff, Inhalation, Daily, Norins, Heinz Knuckles, MD, 1 puff at 11/14/20 0834 .  gabapentin (NEURONTIN) capsule 800 mg, 800 mg, Oral, QID, Norins, Heinz Knuckles, MD, 800 mg at 11/14/20 1718 .  hydrochlorothiazide (HYDRODIURIL) tablet 25 mg, 25 mg, Oral, Daily, Norins, Heinz Knuckles, MD, 25 mg at 11/14/20 9562 .  insulin aspart (novoLOG) injection 0-20 Units, 0-20 Units, Subcutaneous, TID WC, Norins, Heinz Knuckles, MD, 3 Units at 11/14/20 1719 .  ipratropium-albuterol (DUONEB) 0.5-2.5 (3) MG/3ML nebulizer solution 3 mL, 3 mL, Nebulization, Q6H, Norins, Heinz Knuckles, MD, 3 mL at 11/14/20 1409 .  linagliptin (TRADJENTA) tablet 5 mg, 5 mg, Oral, Daily, Norins, Heinz Knuckles, MD, 5 mg at 11/14/20 0918 .  metFORMIN (GLUCOPHAGE-XR) 24 hr tablet 1,000 mg, 1,000 mg, Oral, BID WC, Norins, Heinz Knuckles, MD, 1,000 mg at 11/14/20 1718 .  metoprolol succinate (TOPROL-XL) 24 hr tablet 50 mg, 50 mg, Oral, Daily, Norins, Heinz Knuckles,  MD, 50 mg at 11/14/20 0918 .  mupirocin ointment (BACTROBAN) 2 % 1 application, 1 application, Nasal, BID, Sharion Settler, NP, 1 application at 13[birth year]65 [phone] .  pantoprazole (PROTONIX) EC tablet 40 mg, 40 mg, Oral, Daily, Norins, Heinz Knuckles, MD, 40 mg at 11/14/20 0918 .  simvastatin (ZOCOR) tablet 40 mg, 40 mg, Oral, q1800, Norins, Heinz Knuckles, MD, 40 mg at 11/14/20 1718 .  traZODone (DESYREL) tablet 25 mg, 25 mg, Oral, QHS PRN, Norins, Heinz Knuckles, MD, 25 mg at 11/13/20 2345 .  vancomycin (VANCOREADY)  IVPB 750 mg/150 mL, 750 mg, Intravenous, Q12H, Benita Gutter, RPH, Last Rate: 150 mL/hr at 11/14/20 0930, 750 mg at 11/14/20 0930    ALLERGIES   Bee venom and Hydrocodone-acetaminophen     REVIEW OF SYSTEMS    Review of Systems:  Gen:  Denies  fever, sweats, chills weigh loss  HEENT: Denies blurred vision, double vision, ear pain, eye pain, hearing loss, nose bleeds, sore throat Cardiac:  No dizziness, chest pain or heaviness, chest tightness,edema Resp:   D cough, phlegm production and wheezing bilateral Gi: Denies swallowing difficulty, stomach pain, nausea or vomiting, diarrhea, constipation, bowel incontinence Gu:  Denies bladder incontinence, burning urine Ext:   Denies Joint pain, stiffness or swelling Skin: Denies  skin rash, easy bruising or bleeding or hives Endoc:  Denies polyuria, polydipsia , polyphagia or weight change Psych:   Denies depression, insomnia or hallucinations   Other:  All other systems negative   VS: BP 123/71 (BP Location: Left Arm)   Pulse 83   Temp 98.2 F (36.8 C) (Oral)   Resp 16   Ht 5\' 8"  (1.727 m)   Wt 78.6 kg   SpO2 (!) 89%   BMI 26.35 kg/m      PHYSICAL EXAM    GENERAL:NAD, no fevers, chills, no weakness no fatigue HEAD: Normocephalic, atraumatic.  EYES: Pupils equal, round, reactive to light. Extraocular muscles intact. No scleral icterus.  MOUTH: Moist mucosal membrane. Dentition intact. No abscess noted.  EAR, NOSE, THROAT:  Clear without exudates. No external lesions.  NECK: Supple. No thyromegaly. No nodules. No JVD.  PULMONARY: Bilateral wheezing and rhonchorous breath sound CARDIOVASCULAR: S1 and S2. Regular rate and rhythm. No murmurs, rubs, or gallops. No edema. Pedal pulses 2+ bilaterally.  GASTROINTESTINAL: Soft, nontender, nondistended. No masses. Positive bowel sounds. No hepatosplenomegaly.  MUSCULOSKELETAL: No swelling, clubbing, or edema. Range of motion full in all extremities.  NEUROLOGIC: Cranial nerves II through XII are intact. No gross focal neurological deficits. Sensation intact. Reflexes intact.  SKIN: No ulceration, lesions, rashes, or cyanosis. Skin warm and dry. Turgor intact.  PSYCHIATRIC: Mood, affect within normal limits. The patient is awake, alert and oriented x 3. Insight, judgment intact.       IMAGING    Portable chest 1 View  Result Date: 11/13/2020 CLINICAL DATA:  Pneumonia. EXAM: PORTABLE CHEST 1 VIEW COMPARISON:  Chest x-ray 11/12/2020 FINDINGS: The cardiac silhouette, mediastinal and hilar contours are within normal limits and stable. Stable underlying emphysematous changes. Persistent interstitial and airspace process in the lungs. No pleural effusions. IMPRESSION: Persistent interstitial and airspace process in the lungs. Electronically Signed   By: Marijo Sanes M.D.   On: 11/13/2020 07:38   DG Chest Port 1 View  Result Date: 11/12/2020 CLINICAL DATA:  Respiratory distress, COPD, sepsis EXAM: PORTABLE CHEST 1 VIEW COMPARISON:  03/25/2020 FINDINGS: 2 frontal views of the chest demonstrate a stable cardiac silhouette. There is extensive background scarring and fibrosis compatible with emphysema. Multifocal areas of airspace disease are identified consistent with superimposed pneumonia. This is most pronounced within the suprahilar regions. The left upper lobe nodule previously evaluated by PET scan again noted over the left anterior fifth rib. No effusion or pneumothorax. No  acute bony abnormalities. IMPRESSION: 1. Multifocal bilateral airspace disease concerning for edema or infection superimposed upon background emphysema. 2. Persistent left upper lobe nodule previously evaluated by CT and PET scan. Electronically Signed   By: Randa Ngo M.D.   On: 11/12/2020 21:42  ASSESSMENT/PLAN    Acute on chronic hypoxemic respiratory failure    -Due to community-acquired pneumonia with acute exacerbation of COPD secondary to PNA    -Treat underlying cause with typical COPD care path   Sepsis secondary to community-acquired pneumonia -Present on admission secondary to pneumonia, lactate was elevated  -Interval development of right lower lobe infiltrate as well as peripheral base interstitial opacification  -Leukocytosis is elevated out of proportion to clinical exam and radiographic findings even with steroids  -MRSA PCR is positive c/w vanco  -Patient continues to require 5 L/min nasal cannula at this time  -Additional studies including Fungitell, Legionella, histoplasma urinary antigen, strep pneumo urinary antigen, Aspergillus serology, sputum respiratory cultures and AFB -Patient continues to have bilateral wheezing,Solu-Medrol has been dcd   -Severe acute exacerbation of COPD  -Continue chest physiotherapy and weaning of oxygen-currently at 5 L/min nasal cannula  -Occupational physical therapy to mobilize patient when able  -Solu-Medrol 40 twice daily>>>dcd -DuoNeb every 6 hours while awake -will need to initiate Roflumilast on outpatient basis due to chronic bronchitic phenotype of COPD     Thank you for allowing me to participate in the care of this patient.    Patient/Family are satisfied with care plan and all questions have been answered.   This document was prepared using Dragon voice recognition software and may include unintentional dictation errors.     Ottie Glazier, M.D.  Division of [facility]

## 2020-11-15 LAB — GLUCOSE, CAPILLARY
Glucose-Capillary: 97 mg/dL (ref 70–99)
Glucose-Capillary: 107 mg/dL — ABNORMAL HIGH (ref 70–99)
Glucose-Capillary: 86 mg/dL (ref 70–99)
Glucose-Capillary: 100 mg/dL — ABNORMAL HIGH (ref 70–99)

## 2020-11-15 LAB — CBC
HCT: 30 % — ABNORMAL LOW (ref 39.0–52.0)
Hemoglobin: 9 g/dL — ABNORMAL LOW (ref 13.0–17.0)
MCH: 28 pg (ref 26.0–34.0)
MCHC: 30 g/dL (ref 30.0–36.0)
MCV: 93.2 fL (ref 80.0–100.0)
Platelets: 505 10*3/uL — ABNORMAL HIGH (ref 150–400)
RBC: 3.22 MIL/uL — ABNORMAL LOW (ref 4.22–5.81)
RDW: 14.9 % (ref 11.5–15.5)
WBC: 20.4 10*3/uL — ABNORMAL HIGH (ref 4.0–10.5)
nRBC: 0 % (ref 0.0–0.2)

## 2020-11-15 LAB — HISTOPLASMA ANTIGEN, URINE: Histoplasma Antigen, urine: <0.5 (ref <0.5)

## 2020-11-15 LAB — LEGIONELLA PNEUMOPHILA SEROGP 1 UR AG: L. pneumophila Serogp 1 Ur Ag: NEGATIVE

## 2020-11-15 MED ORDER — IPRATROPIUM-ALBUTEROL 0.5-2.5 (3) MG/3ML IN SOLN
3.0000 mL | Freq: Two times a day (BID) | RESPIRATORY_TRACT | Status: DC
  Administered 2020-11-15 – 2020-11-18 (×7): 3 mL via RESPIRATORY_TRACT
  Filled 2020-11-15 (×7): qty 3

## 2020-11-15 NOTE — Care Management Important Message (Signed)
Important Message  Patient Details  Name: Christian Fields MRN: [id number] Date of Birth: 05/28/1956   Medicare Important Message Given:  Yes     Dannette Barbara 11/15/2020, 11:30 AM

## 2020-11-15 NOTE — TOC Initial Note (Signed)
Transition of Care [facility]) - Initial/Assessment Note    Patient Details  Name: Christian Fields MRN: [id number] Date of Birth: 11-Aug-1956  [facility] CM/SW Contact:    Kerin Salen, RN Phone Number: 11/15/2020, 11:46 AM  Clinical Narrative:   Patient alert and oriented x4 lives in [facility] [place] [facility] with son and Son's girlfriend. Uses [facility], drives car to get his medications or Son will pick them up. Oxygen 2l most of the day, serviced by [employer]. Shopping and cooking done by Son most of the time. ADL's independent, however patient states he will feel safe if someone was available when he showers.  TOC will continue to follow and assist with discharge needs.             Expected Discharge Plan: Home/Self Care Barriers to Discharge: Continued Medical Work up   Patient Goals and CMS Choice Patient states their goals for this hospitalization and ongoing recovery are:: To return home.      Expected Discharge Plan and Services Expected Discharge Plan: Home/Self Care In-house Referral: NA Discharge Planning Services: NA   Living arrangements for the past 2 months: Mobile Home                 DME Arranged: Oxygen DME Agency: [employer] Date DME Agency Contacted: 11/15/20 Time DME Agency Contacted: 1517 Representative spoke with at DME Agency: Oletta Darter [phone] [facility] Arranged: NA          Prior Living Arrangements/Services Living arrangements for the past 2 months: Mobile Home Lives with:: Adult Children (Son and Son's girlfriend.) Patient language and need for interpreter reviewed:: Yes Do you feel safe going back to the place where you live?: Yes      Need for Family Participation in Patient Care: No (Comment) Care giver support system in place?: Yes (comment) Current home services: DME (Oxygen) Criminal Activity/Legal Involvement Pertinent to Current Situation/Hospitalization: No - Comment as needed  Activities of Daily Living Home Assistive  Devices/Equipment: Oxygen ADL Screening (condition at time of admission) Patient's cognitive ability adequate to safely complete daily activities?: Yes Is the patient deaf or have difficulty hearing?: No Does the patient have difficulty seeing, even when wearing glasses/contacts?: No Does the patient have difficulty concentrating, remembering, or making decisions?: No Patient able to express need for assistance with ADLs?: Yes Does the patient have difficulty dressing or bathing?: No Independently performs ADLs?: Yes (appropriate for developmental age) Does the patient have difficulty walking or climbing stairs?: Yes Weakness of Legs: Both Weakness of Arms/Hands: None  Permission Sought/Granted Permission sought to share information with : Case Manager                Emotional Assessment Appearance:: Appears stated age Attitude/Demeanor/Rapport: Engaged Affect (typically observed): Accepting Orientation: : Oriented to Self, Oriented to Place, Oriented to  Time, Oriented to Situation Alcohol / Substance Use: Not Applicable Psych Involvement: No (comment)  Admission diagnosis:  Respiratory distress [R06.03] CAP (community acquired pneumonia) [J18.9] Pneumonia due to infectious organism, unspecified laterality, unspecified part of lung [J18.9] Patient Active Problem List   Diagnosis Date Noted  . Severe sepsis ([place])   . NSCLC of left lung ([place]) 11/12/2020  . Confusion   . Acute on chronic respiratory failure with hypoxia ([place]) 03/26/2020  . Pneumonia of both lower lobes due to infectious organism 03/25/2020  . Arthritis 10/29/2019  . Hemorrhoids 10/29/2019  . Senile nuclear sclerosis, bilateral 02/15/2019  . Cellulitis of left upper limb 02/11/2019  . AAA (  abdominal aortic aneurysm) without rupture ([place]) 01/15/2019  . Localized swelling, mass and lump, upper limb 12/24/2018  . Pain in femur 12/24/2018  . TIA (transient ischemic attack) 11/18/2018  . Cortical age-related  cataract of both eyes 11/02/2018  . Fracture of multiple ribs 04/20/2018  . Overweight (BMI 25.0-29.9) 04/20/2018  . Hypertension 07/08/2017  . Diabetes mellitus ([place]) 07/08/2017  . Hyperlipidemia 07/08/2017  . Tobacco use disorder 07/08/2017  . Atherosclerosis of native arteries of extremity with intermittent claudication ([place]) 07/08/2017  . SOB (shortness of breath) 06/19/2017  . Acute on chronic respiratory failure with hypoxia and hypercapnia ([place]) 12/15/2016  . CAP (community acquired pneumonia) 12/15/2016  . Chronic pain 12/15/2016  . Chronically on opiate therapy 12/15/2016  . History of kidney stones 12/15/2016  . Polypharmacy 12/15/2016  . Somnolence 12/15/2016  . Foot ulcer ([place]) 01/19/2016  . 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 Other psychoactive substance use, unspecified with withdrawal, uncomplicated ([place]) 39/53/2023  . Type 2 diabetes mellitus with foot ulcer (CODE) ([place]) 11/27/2015  . Chronic obstructive pulmonary disease, unspecified ([place]) 01/18/2013  . Depressive disorder 01/18/2013  . Hereditary and idiopathic peripheral neuropathy 01/18/2013  . Sleep apnea 01/18/2013  . Low back pain 11/23/2010  . Acute gouty arthropathy 07/10/2010  . Problem related to lifestyle 10/29/2004   PCP:  Remi Haggard, FNP Pharmacy:   [facility], [place] [address] Phone: [phone] Fax: [phone]     Social Determinants of Health (SDOH) Interventions    Readmission Risk Interventions Readmission Risk Prevention Plan 11/15/2020  Transportation Screening Complete  PCP or Specialist Appt within 3-5 Days Not Complete  Not Complete comments Pending discharge, unit clerk will schedule.  [place] or Home Care Consult Complete  Social Work Consult for [place] Planning/Counseling Complete  Palliative Care Screening Not Applicable  Medication Review Press photographer) Complete  Some recent  data might be hidden

## 2020-11-15 NOTE — Progress Notes (Signed)
Pulmonary Medicine          Date: 11/15/2020,   MRN# [id number] Christian Fields 1956-03-04     AdmissionWeight: 70.8 kg                 CurrentWeight: 78.1 kg   Referring physician: Dr. 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 He shares worsening shortness of breath increased O2 requirement and phlegm production. He received steroids and nebulizer therapy and shares that he has improved. On admission he was found to be febrile tachypneic and tachycardic with wheezing bilaterally he was given a liter of fluid as well as typical community-acquired pneumonia therapy with Zithromax and Rocephin. During my evaluation patient states that he is improved slightly however remains on 5 L/min nasal cannula supplemental oxygen. He has elevated leukocytosis at 51,000.     11/14/20- patient reports feeling better.  We reviewed his hospital course and addition of vancomycin due to MRSA +.  He shares he had MRSA pneumonia in the past.  Microbiology, resp cultures and serology has not returned with results yet.   11/15/20-  Patient is improved significatnly , his wheezing is almost resolved.  Blodwork with trending down leukocytosis. From  51K >>21.  O2 weaned to 4L/min from 5.  PAST MEDICAL HISTORY   Past Medical History:  Diagnosis Date  . Anemia   . Anxiety   . Arthritis   . Asthma   . Cancer ([place])    Basal Cell Skin Cancer  . Chronic back pain   . COPD (chronic obstructive pulmonary disease) ([place])   . Depression   . Diabetes mellitus ([place])   . Dyspnea   . GERD  (gastroesophageal reflux disease)   . Gout   . Gout   . Headache   . History of blood clots    Left Leg--July 2018  . History of kidney stones   . Hyperlipidemia   . Hyperlipidemia   . Hypertension   . Kidney stones   . Neuropathy   . On home oxygen therapy    2 L / M  . Pneumonia 06/2017  . Sleep apnea   . Ulcer of foot ([place])    Right     SURGICAL HISTORY   Past Surgical History:  Procedure Laterality Date  . APPENDECTOMY    . DG FEET 2 VIEWS BILAT    . LIPOMA EXCISION Right 08/15/2017   Procedure: EXCISION TUMOR(CYST) FOOT;  Surgeon: Albertine Patricia, DPM;  Location: [facility] ORS;  Service: Podiatry;  Laterality: Right;  . OTHER SURGICAL HISTORY Bilateral Foot surgery     FAMILY HISTORY   Family History  Problem Relation Age of Onset  . Hypertension Mother      SOCIAL HISTORY   Social History   Tobacco Use  . Smoking status: Former Smoker    Packs/day: 0.50    Types: Cigarettes    Start date: 2018  . Smokeless tobacco: Never Used  Vaping Use  . Vaping Use: Never used  Substance Use Topics  . Alcohol use: Yes  Alcohol/week: 1.0 standard drink    Types: 1 Cans of beer per week    Comment: occ  . Drug use: No     MEDICATIONS    Home Medication:    Current Medication:  Current Facility-Administered Medications:  .  acetaminophen (TYLENOL) tablet 650 mg, 650 mg, Oral, Q6H PRN, Norins, Heinz Knuckles, MD, 650 mg at 11/13/20 2139 .  allopurinol (ZYLOPRIM) tablet 600 mg, 600 mg, Oral, BID, Norins, Heinz Knuckles, MD, 600 mg at 11/15/20 0941 .  aspirin EC tablet 81 mg, 81 mg, Oral, Daily, Norins, Heinz Knuckles, MD, 81 mg at 11/15/20 0942 .  buprenorphine-naloxone (SUBOXONE) 8-2 mg per SL tablet 1 tablet, 1 tablet, Sublingual, TID, 1 tablet at 11/15/20 0941 **AND** [DISCONTINUED] buprenorphine-naloxone (SUBOXONE) 2-0.5 mg per SL tablet 2 tablet, 2 tablet, Sublingual, TID, Fritzi Mandes, MD, 2 tablet at 11/13/20 2132 .  cefTRIAXone (ROCEPHIN) 2 g in sodium chloride 0.9 %  100 mL IVPB, 2 g, Intravenous, Q24H, Norins, Heinz Knuckles, MD, Last Rate: 200 mL/hr at 11/14/20 2244, 2 g at 11/14/20 2244 .  Chlorhexidine Gluconate Cloth 2 % PADS 6 each, 6 each, Topical, Q0600, Sharion Settler, NP .  cholecalciferol (VITAMIN D3) tablet 1,000 Units, 1,000 Units, Oral, Daily, Norins, Heinz Knuckles, MD, 1,000 Units at 11/15/20 0941 .  clopidogrel (PLAVIX) tablet 75 mg, 75 mg, Oral, Daily, Norins, Heinz Knuckles, MD, 75 mg at 11/15/20 0942 .  DULoxetine (CYMBALTA) DR capsule 30 mg, 30 mg, Oral, Daily, Norins, Heinz Knuckles, MD, 30 mg at 11/15/20 0941 .  enoxaparin (LOVENOX) injection 40 mg, 40 mg, Subcutaneous, Q24H, Norins, Heinz Knuckles, MD, 40 mg at 11/14/20 2241 .  ezetimibe (ZETIA) tablet 10 mg, 10 mg, Oral, Daily, Norins, Heinz Knuckles, MD, 10 mg at 11/15/20 0942 .  ferrous sulfate tablet 325 mg, 325 mg, Oral, Q breakfast, Norins, Heinz Knuckles, MD, 325 mg at 11/15/20 0941 .  fluticasone (FLONASE) 50 MCG/ACT nasal spray 2 spray, 2 spray, Each Nare, Daily, Norins, Heinz Knuckles, MD, 2 spray at 11/15/20 [phone] .  fluticasone furoate-vilanterol (BREO ELLIPTA) 100-25 MCG/INH 1 puff, 1 puff, Inhalation, Daily, 1 puff at 11/15/20 0942 **AND** umeclidinium bromide (INCRUSE ELLIPTA) 62.5 MCG/INH 1 puff, 1 puff, Inhalation, Daily, Norins, Heinz Knuckles, MD, 1 puff at 11/15/20 0943 .  gabapentin (NEURONTIN) capsule 800 mg, 800 mg, Oral, QID, Norins, Heinz Knuckles, MD, 800 mg at 11/15/20 1458 .  hydrochlorothiazide (HYDRODIURIL) tablet 25 mg, 25 mg, Oral, Daily, Norins, Heinz Knuckles, MD, 25 mg at 11/15/20 0942 .  insulin aspart (novoLOG) injection 0-20 Units, 0-20 Units, Subcutaneous, TID WC, Norins, Heinz Knuckles, MD, 3 Units at 11/14/20 1719 .  ipratropium-albuterol (DUONEB) 0.5-2.5 (3) MG/3ML nebulizer solution 3 mL, 3 mL, Nebulization, BID, Fritzi Mandes, MD, 3 mL at 11/15/20 0802 .  linagliptin (TRADJENTA) tablet 5 mg, 5 mg, Oral, Daily, Norins, Heinz Knuckles, MD, 5 mg at 11/15/20 0941 .  metFORMIN (GLUCOPHAGE-XR) 24 hr tablet 1,000 mg,  1,000 mg, Oral, BID WC, Norins, Heinz Knuckles, MD, 1,000 mg at 11/15/20 0941 .  metoprolol succinate (TOPROL-XL) 24 hr tablet 50 mg, 50 mg, Oral, Daily, Norins, Heinz Knuckles, MD, 50 mg at 11/15/20 0942 .  mupirocin ointment (BACTROBAN) 2 % 1 application, 1 application, Nasal, BID, Sharion Settler, NP, 1 application at 74/12/87 0944 .  pantoprazole (PROTONIX) EC tablet 40 mg, 40 mg, Oral, Daily, Norins, Heinz Knuckles, MD, 40 mg at 11/15/20 0941 .  simvastatin (ZOCOR) tablet 40 mg, 40 mg, Oral, q1800, Norins, Heinz Knuckles, MD, 40 mg at  11/14/20 1718 .  traZODone (DESYREL) tablet 25 mg, 25 mg, Oral, QHS PRN, Norins, Heinz Knuckles, MD, 25 mg at 11/13/20 2345 .  vancomycin (VANCOREADY) IVPB 750 mg/150 mL, 750 mg, Intravenous, Q12H, Benita Gutter [facility], Last Rate: 150 mL/hr at 11/15/20 0946, 750 mg at 11/15/20 0946    ALLERGIES   Bee venom and Hydrocodone-acetaminophen     REVIEW OF SYSTEMS    Review of Systems:  Gen:  Denies  fever, sweats, chills weigh loss  HEENT: Denies blurred vision, double vision, ear pain, eye pain, hearing loss, nose bleeds, sore throat Cardiac:  No dizziness, chest pain or heaviness, chest tightness,edema Resp:   D cough, phlegm production and wheezing bilateral Gi: Denies swallowing difficulty, stomach pain, nausea or vomiting, diarrhea, constipation, bowel incontinence Gu:  Denies bladder incontinence, burning urine Ext:   Denies Joint pain, stiffness or swelling Skin: Denies  skin rash, easy bruising or bleeding or hives Endoc:  Denies polyuria, polydipsia , polyphagia or weight change Psych:   Denies depression, insomnia or hallucinations   Other:  All other systems negative   VS: BP 139/67 (BP Location: Left Arm)   Pulse 83   Temp 97.8 F (36.6 C) (Oral)   Resp 16   Ht 5\' 8"  (1.727 m)   Wt 78.1 kg   SpO2 90%   BMI 26.18 kg/m      PHYSICAL EXAM    GENERAL:NAD, no fevers, chills, no weakness no fatigue HEAD: Normocephalic, atraumatic.  EYES: Pupils  equal, round, reactive to light. Extraocular muscles intact. No scleral icterus.  MOUTH: Moist mucosal membrane. Dentition intact. No abscess noted.  EAR, NOSE, THROAT: Clear without exudates. No external lesions.  NECK: Supple. No thyromegaly. No nodules. No JVD.  PULMONARY: Bilateral wheezing and rhonchorous breath sound CARDIOVASCULAR: S1 and S2. Regular rate and rhythm. No murmurs, rubs, or gallops. No edema. Pedal pulses 2+ bilaterally.  GASTROINTESTINAL: Soft, nontender, nondistended. No masses. Positive bowel sounds. No hepatosplenomegaly.  MUSCULOSKELETAL: No swelling, clubbing, or edema. Range of motion full in all extremities.  NEUROLOGIC: Cranial nerves II through XII are intact. No gross focal neurological deficits. Sensation intact. Reflexes intact.  SKIN: No ulceration, lesions, rashes, or cyanosis. Skin warm and dry. Turgor intact.  PSYCHIATRIC: Mood, affect within normal limits. The patient is awake, alert and oriented x 3. Insight, judgment intact.       IMAGING    Portable chest 1 View  Result Date: 11/13/2020 CLINICAL DATA:  Pneumonia. EXAM: PORTABLE CHEST 1 VIEW COMPARISON:  Chest x-ray 11/12/2020 FINDINGS: The cardiac silhouette, mediastinal and hilar contours are within normal limits and stable. Stable underlying emphysematous changes. Persistent interstitial and airspace process in the lungs. No pleural effusions. IMPRESSION: Persistent interstitial and airspace process in the lungs. Electronically Signed   By: Marijo Sanes M.D.   On: 11/13/2020 07:38   DG Chest Port 1 View  Result Date: 11/12/2020 CLINICAL DATA:  Respiratory distress, COPD, sepsis EXAM: PORTABLE CHEST 1 VIEW COMPARISON:  03/25/2020 FINDINGS: 2 frontal views of the chest demonstrate a stable cardiac silhouette. There is extensive background scarring and fibrosis compatible with emphysema. Multifocal areas of airspace disease are identified consistent with superimposed pneumonia. This is most pronounced  within the suprahilar regions. The left upper lobe nodule previously evaluated by PET scan again noted over the left anterior fifth rib. No effusion or pneumothorax. No acute bony abnormalities. IMPRESSION: 1. Multifocal bilateral airspace disease concerning for edema or infection superimposed upon background emphysema. 2. Persistent left upper lobe  nodule previously evaluated by CT and PET scan. Electronically Signed   By: Randa Ngo M.D.   On: 11/12/2020 21:42      ASSESSMENT/PLAN    Acute on chronic hypoxemic respiratory failure    -Due to community-acquired pneumonia with acute exacerbation of COPD secondary to PNA    -Treat underlying cause with typical COPD care path   Sepsis secondary to community-acquired pneumonia -Present on admission secondary to pneumonia, lactate was elevated  -Interval development of right lower lobe infiltrate as well as peripheral base interstitial opacification  -Leukocytosis is elevated out of proportion to clinical exam and radiographic findings even with steroids  -MRSA PCR is positive c/w vanco  -Patient weaned from 5 L/min to 4 L/min nasal cannula at this time  -Additional studies including Fungitell, Legionella, histoplasma urinary antigen, strep pneumo urinary antigen, Aspergillus serology, sputum respiratory cultures and AFB- thus far what is resulted all is negative except MRSA pcr Solu-Medrol has been dcd   -Severe acute exacerbation of COPD  -Continue chest physiotherapy and weaning of oxygen-currently at 5 L/min nasal cannula  -Occupational physical therapy to mobilize patient when able  -Solu-Medrol 40 twice daily>>>dcd -DuoNeb every 6 hours while awake -will need to initiate Roflumilast on outpatient basis due to chronic bronchitic phenotype of COPD     Patient is close to baseline may consider DC planning with outpatient follow up.   Thank you for allowing me to participate in the care of this patient.    Patient/Family are  satisfied with care plan and all questions have been answered.   This document was prepared using Dragon voice recognition software and may include unintentional dictation errors.     Ottie Glazier, M.D.  Division of [place]

## 2020-11-15 NOTE — Progress Notes (Signed)
PROGRESS NOTE    KINO DUNSWORTH  OEV:[id number] DOB: [birth year] DOA: 11/12/2020 PCP: Remi Haggard, FNP    Brief Narrative:  Came in with increasing shortness of breath and increase oxygen requirement. Low-grade fever.  12/8- on 4L 02 sat 90  Consultants:   pccm  Procedures:   Antimicrobials:   Vancomycin, ceftriaxone    Subjective: Sob starting to get a little better. But still not at baseline.   Objective: Vitals:   11/15/20 0306 11/15/20 0453 11/15/20 0758 11/15/20 0802  BP:  113/67 118/81   Pulse:  (!) 58 79   Resp:   16   Temp:  97.6 F (36.4 C) 98 F (36.7 C)   TempSrc:  Oral Oral   SpO2:  92% 90% 90%  Weight: 78.1 kg     Height:        Intake/Output Summary (Last 24 hours) at 11/15/2020 0837 Last data filed at 11/15/2020 0536 Gross per 24 hour  Intake 750 ml  Output 2000 ml  Net -1250 ml   Filed Weights   11/12/20 2103 11/14/20 0321 11/15/20 0306  Weight: 70.8 kg 78.6 kg 78.1 kg    Examination:  General exam: Appears calm and comfortable  Respiratory system: Coarse breath sounds, mild wheezing bilaterally Cardiovascular system: S1 & S2 heard, RRR. No JVD, murmurs, rubs, gallops or clicks.  Gastrointestinal system: Abdomen is nondistended, soft and nontender. No organomegaly or masses felt. Normal bowel sounds heard. Central nervous system: Alert and oriented.  Grossly intact Extremities: No edema Skin: Warm dry Psychiatry: Judgement and insight appear normal. Mood & affect appropriate.     Data Reviewed: I have personally reviewed following labs and imaging studies  CBC: Recent Labs  Lab 11/12/20 2108 11/13/20 0533 11/15/20 0536  WBC 52.6* 51.2* 20.4*  NEUTROABS 48.2*  --   --   HGB 10.8* 10.1* 9.0*  HCT 35.8* 33.3* 30.0*  MCV 93.2 94.3 93.2  PLT 583* 480* 829*   Basic Metabolic Panel: Recent Labs  Lab 11/12/20 2108 11/13/20 0533 11/14/20 0417  NA 137 138  --   K 3.3* 4.2  --   CL 100 97*  --   CO2 26 27  --   GLUCOSE  191* 245*  --   BUN 11 15  --   CREATININE 1.08 1.20 1.27*  CALCIUM 8.7* 9.0  --    GFR: Estimated Creatinine Clearance: 56.1 mL/min (A) (by C-G formula based on SCr of 1.27 mg/dL (H)). Liver Function Tests: Recent Labs  Lab 11/12/20 2108  AST 15  ALT 12  ALKPHOS 108  BILITOT 0.5  PROT 7.3  ALBUMIN 2.9*   No results for input(s): LIPASE, AMYLASE in the last 168 hours. No results for input(s): AMMONIA in the last 168 hours. Coagulation Profile: Recent Labs  Lab 11/12/20 2108  INR 1.1   Cardiac Enzymes: No results for input(s): CKTOTAL, CKMB, CKMBINDEX, TROPONINI in the last 168 hours. BNP (last 3 results) No results for input(s): PROBNP in the last 8760 hours. HbA1C: Recent Labs    11/12/20 2314  HGBA1C 6.0*   CBG: Recent Labs  Lab 11/14/20 0745 11/14/20 1130 11/14/20 1639 11/14/20 2005 11/15/20 0759  GLUCAP 142* 154* 121* 116* 97   Lipid Profile: No results for input(s): CHOL, HDL, LDLCALC, TRIG, CHOLHDL, LDLDIRECT in the last 72 hours. Thyroid Function Tests: No results for input(s): TSH, T4TOTAL, FREET4, T3FREE, THYROIDAB in the last 72 hours. Anemia Panel: No results for input(s): VITAMINB12, FOLATE, FERRITIN, TIBC, IRON, RETICCTPCT  in the last 72 hours. Sepsis Labs: Recent Labs  Lab 11/12/20 2108 11/12/20 2314 11/13/20 1600 11/13/20 1729 11/13/20 2059 11/14/20 0417  PROCALCITON 5.68  --  14.16  --   --  14.16  LATICACIDVEN 1.7 2.4*  --  1.5 1.6  --     Recent Results (from the past 240 hour(s))  Blood Culture (routine x 2)     Status: None (Preliminary result)   Collection Time: 11/12/20  9:07 PM   Specimen: BLOOD  Result Value Ref Range Status   Specimen Description BLOOD RIGHT ANTECUBITAL  Final   Special Requests   Final    BOTTLES DRAWN AEROBIC AND ANAEROBIC Blood Culture adequate volume   Culture   Final    NO GROWTH 3 DAYS Performed at [facility], [address], [place], [place] [address]    Report Status PENDING   Incomplete  Resp Panel by RT-PCR (Flu A&B, Covid) Nasopharyngeal Swab     Status: None   Collection Time: 11/12/20  9:08 PM   Specimen: Nasopharyngeal Swab; Nasopharyngeal(NP) swabs in vial transport medium  Result Value Ref Range Status   SARS Coronavirus 2 by RT PCR NEGATIVE NEGATIVE Final    Comment: (NOTE) SARS-CoV-2 target nucleic acids are NOT DETECTED.  The SARS-CoV-2 RNA is generally detectable in upper respiratory specimens during the acute phase of infection. The lowest concentration of SARS-CoV-2 viral copies this assay can detect is 138 copies/mL. A negative result does not preclude SARS-Cov-2 infection and should not be used as the sole basis for treatment or other patient management decisions. A negative result may occur with  improper specimen collection/handling, submission of specimen other than nasopharyngeal swab, presence of viral mutation(s) within the areas targeted by this assay, and inadequate number of viral copies(<138 copies/mL). A negative result must be combined with clinical observations, patient history, and epidemiological information. The expected result is Negative.  Fact Sheet for Patients:  EntrepreneurPulse.com.au  Fact Sheet for Healthcare Providers:  IncredibleEmployment.be  This test is no t yet approved or cleared by the [place] FDA and  has been authorized for detection and/or diagnosis of SARS-CoV-2 by FDA under an Emergency Use Authorization (EUA). This EUA will remain  in effect (meaning this test can be used) for the duration of the COVID-19 declaration under Section 564(b)(1) of the Act, 21 U.S.C.section 360bbb-3(b)(1), unless the authorization is terminated  or revoked sooner.       Influenza A by PCR NEGATIVE NEGATIVE Final   Influenza B by PCR NEGATIVE NEGATIVE Final    Comment: (NOTE) The Xpert Xpress SARS-CoV-2/FLU/RSV plus assay is intended as an aid in the diagnosis of influenza  from Nasopharyngeal swab specimens and should not be used as a sole basis for treatment. Nasal washings and aspirates are unacceptable for Xpert Xpress SARS-CoV-2/FLU/RSV testing.  Fact Sheet for Patients: EntrepreneurPulse.com.au  Fact Sheet for Healthcare Providers: IncredibleEmployment.be  This test is not yet approved or cleared by the [place] FDA and has been authorized for detection and/or diagnosis of SARS-CoV-2 by FDA under an Emergency Use Authorization (EUA). This EUA will remain in effect (meaning this test can be used) for the duration of the COVID-19 declaration under Section 564(b)(1) of the Act, 21 U.S.C. section 360bbb-3(b)(1), unless the authorization is terminated or revoked.  Performed at [facility], [address] [place], [place] [address]   Blood Culture (routine x 2)     Status: None (Preliminary result)   Collection Time: 11/12/20  9:08 PM  Specimen: BLOOD  Result Value Ref Range Status   Specimen Description BLOOD BLOOD LEFT WRIST  Final   Special Requests   Final    BOTTLES DRAWN AEROBIC AND ANAEROBIC Blood Culture adequate volume   Culture   Final    NO GROWTH 3 DAYS Performed at [facility], [address], [place], [place] [address]    Report Status PENDING  Incomplete  MRSA PCR Screening     Status: Abnormal   Collection Time: 11/13/20  1:05 AM   Specimen: Nasopharyngeal  Result Value Ref Range Status   MRSA by PCR POSITIVE (A) NEGATIVE Final    Comment:        The GeneXpert MRSA Assay (FDA approved for NASAL specimens only), is one component of a comprehensive MRSA colonization surveillance program. It is not intended to diagnose MRSA infection nor to guide or monitor treatment for MRSA infections. RESULT CALLED TO, READ BACK BY AND VERIFIED WITH: TESS THOMAS ON 11/13/20 AT 0224 QSD Performed at [facility], [address], [place], [place] [address]           Radiology Studies: No results found.      Scheduled Meds: . allopurinol  600 mg Oral BID  . aspirin EC  81 mg Oral Daily  . buprenorphine-naloxone  1 tablet Sublingual TID  . Chlorhexidine Gluconate Cloth  6 each Topical Q0600  . cholecalciferol  1,000 Units Oral Daily  . clopidogrel  75 mg Oral Daily  . DULoxetine  30 mg Oral Daily  . enoxaparin (LOVENOX) injection  40 mg Subcutaneous Q24H  . ezetimibe  10 mg Oral Daily  . ferrous sulfate  325 mg Oral Q breakfast  . fluticasone  2 spray Each Nare Daily  . fluticasone furoate-vilanterol  1 puff Inhalation Daily   And  . umeclidinium bromide  1 puff Inhalation Daily  . gabapentin  800 mg Oral QID  . hydrochlorothiazide  25 mg Oral Daily  . insulin aspart  0-20 Units Subcutaneous TID WC  . ipratropium-albuterol  3 mL Nebulization BID  . linagliptin  5 mg Oral Daily  . metFORMIN  1,000 mg Oral BID WC  . metoprolol succinate  50 mg Oral Daily  . mupirocin ointment  1 application Nasal BID  . pantoprazole  40 mg Oral Daily  . simvastatin  40 mg Oral q1800   Continuous Infusions: . cefTRIAXone (ROCEPHIN)  IV 2 g (11/14/20 2244)  . vancomycin 750 mg (11/14/20 2336)    Assessment & Plan:   Active Problems:   Hypertension   Diabetes mellitus ([place])   Hyperlipidemia   Tobacco use disorder   CAP (community acquired pneumonia)   Chronic obstructive pulmonary disease, unspecified ([place])   Sleep apnea   NSCLC of left lung ([place])   Severe sepsis ([place])   Duane Lope Terryis a 63 y.o.malewith medical history significant ofcentrilobar emphysema on home O2, recent diagnosis of NSCLC LUL s/p XRT, anemia, DM2, who has had a 48 hr h/o increasing SOB/DOE and increased need for oxygen. He was admitted in April '21 for CAP. Due to his progressive SOB he called EMS. In route he was given Nebulizer treatment and IV steroids for his COPD symptoms.  Severe sepsis present on admission -- patient came in with fever shortness of  breath and chest x-ray revealed multifocal airspace disease consistent with pneumonia -- fever of 100.4, tachycardia, tachypnea, lactic acid  2.2 --MRSA + Continue on iv vanco and rocephin PCCM following (primary pccm -Dr.  Raul Del) -- continue vancomycin and IV Rocephin -- pulmonary consultation with Dr Lanney Gins (patient follows with Dr. Fleming)-serology pending Continue to support, I-S, nebulizer IV steroids discontinued    Leukocytosis Afebrile IV steroid was discontinued, WBC decreasing...51>>20.4  Left upper lobe lung mass status post radiation treatment (SBTR) --Patient to follow-up with CT scan of the lungs in couple weeks with Dr. Donella Stade -Primary oncologist Dr. Janese Banks    Chronic narcotic dependence -- patient on Suboxone-- follows withDr. Jeanice Lim --Continue Suboxone  COPD chronic/end-stage emphysema/chronic respiratory failure/chronic oxygen --Continue nebs, 02 support Steroid d/c'd   Type II diabetes, stable sugars -- last A1c 5.4 in April 2021 --BG stable.   Hypertension continue beta-blockers and hydrochlorothiazide  Hyperlipidemia continue statins  H/o Gout  continue allopurinol   DVT prophylaxis: lovenox Code Status: full Family Communication: none at bedside Status is: Inpatient  Remains inpatient appropriate because:Inpatient level of care appropriate due to severity of illness   Dispo: The patient is from: Home              Anticipated d/c is to: Home              Anticipated d/c date is: 2 days              Patient currently is not medically stable to d/c.Slow improvement. On IVabx.             LOS: 3 days   Time spent: 35 minutes with more than 50% on [place], MD [facility] Pager [phone]  If 7PM-7AM, please contact night-coverage 11/15/2020, 8:37 AM

## 2020-11-16 ENCOUNTER — Inpatient Hospital Stay: Payer: Medicare HMO

## 2020-11-16 LAB — BASIC METABOLIC PANEL
Anion gap: 11 (ref 5–15)
BUN: 29 mg/dL — ABNORMAL HIGH (ref 8–23)
CO2: 31 mmol/L (ref 22–32)
Calcium: 9.5 mg/dL (ref 8.9–10.3)
Chloride: 98 mmol/L (ref 98–111)
Creatinine, Ser: 1.11 mg/dL (ref 0.61–1.24)
GFR, Estimated: >60 mL/min (ref >60)
Glucose, Bld: 90 mg/dL (ref 70–99)
Potassium: 4 mmol/L (ref 3.5–5.1)
Sodium: 140 mmol/L (ref 135–145)

## 2020-11-16 LAB — CBC
HCT: 35.6 % — ABNORMAL LOW (ref 39.0–52.0)
Hemoglobin: 10.8 g/dL — ABNORMAL LOW (ref 13.0–17.0)
MCH: 28.6 pg (ref 26.0–34.0)
MCHC: 30.3 g/dL (ref 30.0–36.0)
MCV: 94.2 fL (ref 80.0–100.0)
Platelets: 577 10*3/uL — ABNORMAL HIGH (ref 150–400)
RBC: 3.78 MIL/uL — ABNORMAL LOW (ref 4.22–5.81)
RDW: 15.1 % (ref 11.5–15.5)
WBC: 12 10*3/uL — ABNORMAL HIGH (ref 4.0–10.5)
nRBC: 0 % (ref 0.0–0.2)

## 2020-11-16 LAB — GLUCOSE, CAPILLARY
Glucose-Capillary: 103 mg/dL — ABNORMAL HIGH (ref 70–99)
Glucose-Capillary: 112 mg/dL — ABNORMAL HIGH (ref 70–99)
Glucose-Capillary: 110 mg/dL — ABNORMAL HIGH (ref 70–99)
Glucose-Capillary: 91 mg/dL (ref 70–99)

## 2020-11-16 NOTE — Progress Notes (Signed)
Mobility Specialist - Progress Note   11/16/20 1659  Mobility  Range of Motion/Exercises Right leg;Left leg (ankle pumps, quad sets, slr, hip abd/add)  Level of Assistance Independent  Assistive Device None  Distance Ambulated (ft) 0 ft  Mobility Response Tolerated well  Mobility performed by Mobility specialist  $Mobility charge 1 Mobility    Pre-mobility: 79 HR, 81% SpO2 Post-mobility: 81 HR, 90% SpO2   Pt was lying in bed upon arrival utilizing 4L. Pt agreed to session. Pt denied any pain, nausea, or fatigue. Noted pt was tampering with [place] prior to vitals being taken, O2 desat to 81%. Mobility tech went over PLB technique with pt prior to activity, increasing sats to low 90s. Pt voiced that lately he desats with min exertion. Pt also stated that he was utilizing 2L at baseline PTA but has been on 4L as of late. Pt performed exercises: ankle pumps, quad sets, straight leg raises, and hip abd/add 2x10. Pt cued for activity pacing. Overall, pt tolerated session well. Pt was left in bed with all needs in reach.    Kathee Delton Mobility Specialist 11/16/20, 5:03 PM

## 2020-11-16 NOTE — Progress Notes (Signed)
Pulmonary Medicine          Date: 11/16/2020,   MRN# [id number] Christian Fields [birth year]     AdmissionWeight: 70.8 kg                 CurrentWeight: 80.8 kg   Referring physician: Dr. 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 He shares worsening shortness of breath increased O2 requirement and phlegm production. He received steroids and nebulizer therapy and shares that he has improved. On admission he was found to be febrile tachypneic and tachycardic with wheezing bilaterally he was given a liter of fluid as well as typical community-acquired pneumonia therapy with Zithromax and Rocephin. During my evaluation patient states that he is improved slightly however remains on 5 L/min nasal cannula supplemental oxygen. He has elevated leukocytosis at 51,000.     11/14/20- patient reports feeling better.  We reviewed his hospital course and addition of vancomycin due to MRSA +.  He shares he had MRSA pneumonia in the past.  Microbiology, resp cultures and serology has not returned with results yet.   11/15/20-  Patient is improved significatnly , his wheezing is almost resolved.  Blodwork with trending down leukocytosis. From  51K >>21.  O2 weaned to 4L/min from 5.  11/16/20- patietn is resting in bed in no distress.  Lung ausculatation is significantly improved. WBC count trending down well.   PAST MEDICAL HISTORY   Past Medical History:  Diagnosis Date  . Anemia   . Anxiety   . Arthritis   . Asthma   . Cancer ([place])    Basal Cell Skin Cancer  . Chronic back pain   .  COPD (chronic obstructive pulmonary disease) ([place])   . Depression   . Diabetes mellitus ([place])   . Dyspnea   . GERD (gastroesophageal reflux disease)   . Gout   . Gout   . Headache   . History of blood clots    Left Leg--July 2018  . History of kidney stones   . Hyperlipidemia   . Hyperlipidemia   . Hypertension   . Kidney stones   . Neuropathy   . On home oxygen therapy    2 L / M  . Pneumonia 06/2017  . Sleep apnea   . Ulcer of foot ([place])    Right     SURGICAL HISTORY   Past Surgical History:  Procedure Laterality Date  . APPENDECTOMY    . DG FEET 2 VIEWS BILAT    . LIPOMA EXCISION Right 08/15/2017   Procedure: EXCISION TUMOR(CYST) FOOT;  Surgeon: Albertine Patricia, DPM;  Location: [facility] ORS;  Service: Podiatry;  Laterality: Right;  . OTHER SURGICAL HISTORY Bilateral Foot surgery     FAMILY HISTORY   Family History  Problem Relation Age of Onset  . Hypertension Mother      SOCIAL HISTORY   Social History   Tobacco Use  . Smoking status: Former Smoker    Packs/day: 0.50    Types: Cigarettes    Start date: 2018  . Smokeless tobacco: Never  Used  Vaping Use  . Vaping Use: Never used  Substance Use Topics  . Alcohol use: Yes    Alcohol/week: 1.0 standard drink    Types: 1 Cans of beer per week    Comment: occ  . Drug use: No     MEDICATIONS    Home Medication:    Current Medication:  Current Facility-Administered Medications:  .  acetaminophen (TYLENOL) tablet 650 mg, 650 mg, Oral, Q6H PRN, Norins, Heinz Knuckles, MD, 650 mg at 11/15/20 2221 .  allopurinol (ZYLOPRIM) tablet 600 mg, 600 mg, Oral, BID, Norins, Heinz Knuckles, MD, 600 mg at 11/16/20 0956 .  aspirin EC tablet 81 mg, 81 mg, Oral, Daily, Norins, Heinz Knuckles, MD, 81 mg at 11/16/20 0952 .  buprenorphine-naloxone (SUBOXONE) 8-2 mg per SL tablet 1 tablet, 1 tablet, Sublingual, TID, 1 tablet at 11/16/20 1559 **AND** [DISCONTINUED] buprenorphine-naloxone (SUBOXONE) 2-0.5 mg per SL tablet 2 tablet, 2  tablet, Sublingual, TID, Fritzi Mandes, MD, 2 tablet at 11/13/20 2132 .  cefTRIAXone (ROCEPHIN) 2 g in sodium chloride 0.9 % 100 mL IVPB, 2 g, Intravenous, Q24H, Norins, Heinz Knuckles, MD, Last Rate: 200 mL/hr at 11/15/20 2109, 2 g at 11/15/20 2109 .  Chlorhexidine Gluconate Cloth 2 % PADS 6 each, 6 each, Topical, Q0600, Sharion Settler, NP .  cholecalciferol (VITAMIN D3) tablet 1,000 Units, 1,000 Units, Oral, Daily, Norins, Heinz Knuckles, MD, 1,000 Units at 11/16/20 [phone] .  clopidogrel (PLAVIX) tablet 75 mg, 75 mg, Oral, Daily, Norins, Heinz Knuckles, MD, 75 mg at 11/16/20 0952 .  DULoxetine (CYMBALTA) DR capsule 30 mg, 30 mg, Oral, Daily, Norins, Heinz Knuckles, MD, 30 mg at 11/16/20 0952 .  enoxaparin (LOVENOX) injection 40 mg, 40 mg, Subcutaneous, Q24H, Norins, Heinz Knuckles, MD, 40 mg at 11/15/20 2216 .  ezetimibe (ZETIA) tablet 10 mg, 10 mg, Oral, Daily, Norins, Heinz Knuckles, MD, 10 mg at 11/16/20 0951 .  ferrous sulfate tablet 325 mg, 325 mg, Oral, Q breakfast, Norins, Heinz Knuckles, MD, 325 mg at 11/16/20 3664 .  fluticasone (FLONASE) 50 MCG/ACT nasal spray 2 spray, 2 spray, Each Nare, Daily, Norins, Heinz Knuckles, MD, 2 spray at 11/16/20 [phone] .  fluticasone furoate-vilanterol (BREO ELLIPTA) 100-25 MCG/INH 1 puff, 1 puff, Inhalation, Daily, 1 puff at 11/16/20 0838 **AND** umeclidinium bromide (INCRUSE ELLIPTA) 62.5 MCG/INH 1 puff, 1 puff, Inhalation, Daily, Norins, Heinz Knuckles, MD, 1 puff at 11/16/20 [phone] .  gabapentin (NEURONTIN) capsule 800 mg, 800 mg, Oral, QID, Norins, Heinz Knuckles, MD, 800 mg at 11/16/20 1700 .  hydrochlorothiazide (HYDRODIURIL) tablet 25 mg, 25 mg, Oral, Daily, Norins, Heinz Knuckles, MD, 25 mg at 11/16/20 0952 .  insulin aspart (novoLOG) injection 0-20 Units, 0-20 Units, Subcutaneous, TID WC, Norins, Heinz Knuckles, MD, 3 Units at 11/14/20 1719 .  ipratropium-albuterol (DUONEB) 0.5-2.5 (3) MG/3ML nebulizer solution 3 mL, 3 mL, Nebulization, BID, Fritzi Mandes, MD, 3 mL at 11/16/20 0748 .  linagliptin (TRADJENTA) tablet  5 mg, 5 mg, Oral, Daily, Norins, Heinz Knuckles, MD, 5 mg at 11/16/20 0952 .  metFORMIN (GLUCOPHAGE-XR) 24 hr tablet 1,000 mg, 1,000 mg, Oral, BID WC, Norins, Heinz Knuckles, MD, 1,000 mg at 11/16/20 1658 .  metoprolol succinate (TOPROL-XL) 24 hr tablet 50 mg, 50 mg, Oral, Daily, Norins, Heinz Knuckles, MD, 50 mg at 11/16/20 0952 .  mupirocin ointment (BACTROBAN) 2 % 1 application, 1 application, Nasal, BID, Sharion Settler, NP, 1 application at 95/63/87 [phone] .  pantoprazole (PROTONIX) EC tablet 40 mg, 40 mg, Oral, Daily, Norins, Heinz Knuckles, MD, 40  mg at 11/16/20 0952 .  simvastatin (ZOCOR) tablet 40 mg, 40 mg, Oral, q1800, Norins, Heinz Knuckles, MD, 40 mg at 11/16/20 1700 .  traZODone (DESYREL) tablet 25 mg, 25 mg, Oral, QHS PRN, Norins, Heinz Knuckles, MD, 25 mg at 11/15/20 2108 .  vancomycin (VANCOREADY) IVPB 750 mg/150 mL, 750 mg, Intravenous, Q12H, Nolberto Hanlon, MD, Last Rate: 150 mL/hr at 11/16/20 1640, Infusion Verify at 11/16/20 1640    ALLERGIES   Bee venom and Hydrocodone-acetaminophen     REVIEW OF SYSTEMS    Review of Systems:  Gen:  Denies  fever, sweats, chills weigh loss  HEENT: Denies blurred vision, double vision, ear pain, eye pain, hearing loss, nose bleeds, sore throat Cardiac:  No dizziness, chest pain or heaviness, chest tightness,edema Resp:   D cough, phlegm production and wheezing bilateral Gi: Denies swallowing difficulty, stomach pain, nausea or vomiting, diarrhea, constipation, bowel incontinence Gu:  Denies bladder incontinence, burning urine Ext:   Denies Joint pain, stiffness or swelling Skin: Denies  skin rash, easy bruising or bleeding or hives Endoc:  Denies polyuria, polydipsia , polyphagia or weight change Psych:   Denies depression, insomnia or hallucinations   Other:  All other systems negative   VS: BP 122/72 (BP Location: Left Arm)   Pulse 81   Temp 97.8 F (36.6 C)   Resp 18   Ht 5\' 8"  (1.727 m)   Wt 80.8 kg   SpO2 91%   BMI 27.08 kg/m      PHYSICAL  EXAM    GENERAL:NAD, no fevers, chills, no weakness no fatigue HEAD: Normocephalic, atraumatic.  EYES: Pupils equal, round, reactive to light. Extraocular muscles intact. No scleral icterus.  MOUTH: Moist mucosal membrane. Dentition intact. No abscess noted.  EAR, NOSE, THROAT: Clear without exudates. No external lesions.  NECK: Supple. No thyromegaly. No nodules. No JVD.  PULMONARY: Bilateral wheezing and rhonchorous breath sound CARDIOVASCULAR: S1 and S2. Regular rate and rhythm. No murmurs, rubs, or gallops. No edema. Pedal pulses 2+ bilaterally.  GASTROINTESTINAL: Soft, nontender, nondistended. No masses. Positive bowel sounds. No hepatosplenomegaly.  MUSCULOSKELETAL: No swelling, clubbing, or edema. Range of motion full in all extremities.  NEUROLOGIC: Cranial nerves II through XII are intact. No gross focal neurological deficits. Sensation intact. Reflexes intact.  SKIN: No ulceration, lesions, rashes, or cyanosis. Skin warm and dry. Turgor intact.  PSYCHIATRIC: Mood, affect within normal limits. The patient is awake, alert and oriented x 3. Insight, judgment intact.       IMAGING    DG Chest Port 1 View  Result Date: 11/16/2020 CLINICAL DATA:  Respiratory distress, abnormal chest x-ray, history asthma, COPD, diabetes mellitus, hypertension EXAM: PORTABLE CHEST 1 VIEW COMPARISON:  Portable exam 0656 hours compared to 11/13/2020 FINDINGS: Normal heart size, mediastinal contours, and pulmonary vascularity. Atherosclerotic calcification aorta. Emphysematous and bronchitic changes consistent with COPD. Scattered chronic interstitial lung changes with superimposed acute infiltrate again identified in the RIGHT upper lobe, less at RIGHT base. No pleural effusion or pneumothorax. Bones demineralized. IMPRESSION: Changes of COPD and chronic interstitial lung disease with persistent superimposed RIGHT lung infiltrates. Electronically Signed   By: Lavonia Dana M.D.   On: 11/16/2020 08:34    Portable chest 1 View  Result Date: 11/13/2020 CLINICAL DATA:  Pneumonia. EXAM: PORTABLE CHEST 1 VIEW COMPARISON:  Chest x-ray 11/12/2020 FINDINGS: The cardiac silhouette, mediastinal and hilar contours are within normal limits and stable. Stable underlying emphysematous changes. Persistent interstitial and airspace process in the lungs. No pleural effusions. IMPRESSION: Persistent  interstitial and airspace process in the lungs. Electronically Signed   By: Marijo Sanes M.D.   On: 11/13/2020 07:38   DG Chest Port 1 View  Result Date: 11/12/2020 CLINICAL DATA:  Respiratory distress, COPD, sepsis EXAM: PORTABLE CHEST 1 VIEW COMPARISON:  03/25/2020 FINDINGS: 2 frontal views of the chest demonstrate a stable cardiac silhouette. There is extensive background scarring and fibrosis compatible with emphysema. Multifocal areas of airspace disease are identified consistent with superimposed pneumonia. This is most pronounced within the suprahilar regions. The left upper lobe nodule previously evaluated by PET scan again noted over the left anterior fifth rib. No effusion or pneumothorax. No acute bony abnormalities. IMPRESSION: 1. Multifocal bilateral airspace disease concerning for edema or infection superimposed upon background emphysema. 2. Persistent left upper lobe nodule previously evaluated by CT and PET scan. Electronically Signed   By: Randa Ngo M.D.   On: 11/12/2020 21:42      ASSESSMENT/PLAN    Acute on chronic hypoxemic respiratory failure    -Due to community-acquired pneumonia with acute exacerbation of COPD secondary to PNA    -Treat underlying cause with typical COPD care path   Sepsis secondary to community-acquired pneumonia -Present on admission secondary to pneumonia, lactate was elevated  -Interval development of right lower lobe infiltrate as well as peripheral base interstitial opacification  -Leukocytosis is elevated out of proportion to clinical exam and radiographic  findings even with steroids  -MRSA PCR is positive c/w vanco  -Patient weaned from 5 L/min to 4 L/min nasal cannula at this time  -Additional studies including Fungitell, Legionella, histoplasma urinary antigen, strep pneumo urinary antigen, Aspergillus serology, sputum respiratory cultures and AFB- thus far what is resulted all is negative except MRSA pcr Solu-Medrol has been dcd   -Severe acute exacerbation of COPD  -Continue chest physiotherapy and weaning of oxygen-currently at 5 L/min nasal cannula  -Occupational physical therapy to mobilize patient when able  -Solu-Medrol 40 twice daily>>>dcd -DuoNeb every 6 hours while awake -will need to initiate Roflumilast on outpatient basis due to chronic bronchitic phenotype of COPD     Patient is close to baseline may consider DC planning with outpatient follow up.   Thank you for allowing me to participate in the care of this patient.    Patient/Family are satisfied with care plan and all questions have been answered.   This document was prepared using Dragon voice recognition software and may include unintentional dictation errors.     Ottie Glazier, M.D.  Division of [facility]

## 2020-11-16 NOTE — Progress Notes (Signed)
PROGRESS NOTE    Christian Fields  OXB:[id number] DOB: 06/17/1956 DOA: 11/12/2020 PCP: Remi Haggard, FNP    Brief Narrative:  Came in with increasing shortness of breath and increase oxygen requirement. Low-grade fever.  12/8- on 4L 02 sat 90 112/8-still 4L with 02 sat 90% at rest.Baseline 02 at home 2L  Consultants:   pccm  Procedures:   Antimicrobials:   Vancomycin, ceftriaxone    Subjective: Improving slowly. Still not at baseline. At rest still requiring 4L.  Objective: Vitals:   11/16/20 0545 11/16/20 0600 11/16/20 0612 11/16/20 0750  BP: 121/78     Pulse: 84     Resp: 13     Temp: 98.2 F (36.8 C)     TempSrc: Oral     SpO2: (!) 89% 93%  90%  Weight:   80.8 kg   Height:        Intake/Output Summary (Last 24 hours) at 11/16/2020 0831 Last data filed at 11/16/2020 6834 Gross per 24 hour  Intake 360 ml  Output 2095 ml  Net -1735 ml   Filed Weights   11/14/20 0321 11/15/20 0306 11/16/20 0612  Weight: 78.6 kg 78.1 kg 80.8 kg    Examination: Calm, not tachypneic Mild expiratory wheeze bilaterally, no rhonchi Regular S1-S2 no murmurs Soft benign positive bowel sounds No edema Alert oriented x3, grossly intact Mood and judgment appropriate in current setting     Data Reviewed: I have personally reviewed following labs and imaging studies  CBC: Recent Labs  Lab 11/12/20 2108 11/13/20 0533 11/15/20 0536 11/16/20 0534  WBC 52.6* 51.2* 20.4* 12.0*  NEUTROABS 48.2*  --   --   --   HGB 10.8* 10.1* 9.0* 10.8*  HCT 35.8* 33.3* 30.0* 35.6*  MCV 93.2 94.3 93.2 94.2  PLT 583* 480* 505* 196*   Basic Metabolic Panel: Recent Labs  Lab 11/12/20 2108 11/13/20 0533 11/14/20 0417 11/16/20 0534  NA 137 138  --  140  K 3.3* 4.2  --  4.0  CL 100 97*  --  98  CO2 26 27  --  31  GLUCOSE 191* 245*  --  90  BUN 11 15  --  29*  CREATININE 1.08 1.20 1.27* 1.11  CALCIUM 8.7* 9.0  --  9.5   GFR: Estimated Creatinine Clearance: 64.2 mL/min (by C-G  formula based on SCr of 1.11 mg/dL). Liver Function Tests: Recent Labs  Lab 11/12/20 2108  AST 15  ALT 12  ALKPHOS 108  BILITOT 0.5  PROT 7.3  ALBUMIN 2.9*   No results for input(s): LIPASE, AMYLASE in the last 168 hours. No results for input(s): AMMONIA in the last 168 hours. Coagulation Profile: Recent Labs  Lab 11/12/20 2108  INR 1.1   Cardiac Enzymes: No results for input(s): CKTOTAL, CKMB, CKMBINDEX, TROPONINI in the last 168 hours. BNP (last 3 results) No results for input(s): PROBNP in the last 8760 hours. HbA1C: No results for input(s): HGBA1C in the last 72 hours. CBG: Recent Labs  Lab 11/14/20 2005 11/15/20 0759 11/15/20 1224 11/15/20 1638 11/15/20 2048  GLUCAP 116* 97 107* 86 100*   Lipid Profile: No results for input(s): CHOL, HDL, LDLCALC, TRIG, CHOLHDL, LDLDIRECT in the last 72 hours. Thyroid Function Tests: No results for input(s): TSH, T4TOTAL, FREET4, T3FREE, THYROIDAB in the last 72 hours. Anemia Panel: No results for input(s): VITAMINB12, FOLATE, FERRITIN, TIBC, IRON, RETICCTPCT in the last 72 hours. Sepsis Labs: Recent Labs  Lab 11/12/20 2108 11/12/20 2314 11/13/20 1600 11/13/20  1729 11/13/20 2059 11/14/20 0417  PROCALCITON 5.68  --  14.16  --   --  14.16  LATICACIDVEN 1.7 2.4*  --  1.5 1.6  --     Recent Results (from the past 240 hour(s))  Blood Culture (routine x 2)     Status: None (Preliminary result)   Collection Time: 11/12/20  9:07 PM   Specimen: BLOOD  Result Value Ref Range Status   Specimen Description BLOOD RIGHT ANTECUBITAL  Final   Special Requests   Final    BOTTLES DRAWN AEROBIC AND ANAEROBIC Blood Culture adequate volume   Culture   Final    NO GROWTH 4 DAYS Performed at [facility], [address] [place], [place] [address]    Report Status PENDING  Incomplete  Resp Panel by RT-PCR (Flu A&B, Covid) Nasopharyngeal Swab     Status: None   Collection Time: 11/12/20  9:08 PM   Specimen: Nasopharyngeal  Swab; Nasopharyngeal(NP) swabs in vial transport medium  Result Value Ref Range Status   SARS Coronavirus 2 by RT PCR NEGATIVE NEGATIVE Final    Comment: (NOTE) SARS-CoV-2 target nucleic acids are NOT DETECTED.  The SARS-CoV-2 RNA is generally detectable in upper respiratory specimens during the acute phase of infection. The lowest concentration of SARS-CoV-2 viral copies this assay can detect is 138 copies/mL. A negative result does not preclude SARS-Cov-2 infection and should not be used as the sole basis for treatment or other patient management decisions. A negative result may occur with  improper specimen collection/handling, submission of specimen other than nasopharyngeal swab, presence of viral mutation(s) within the areas targeted by this assay, and inadequate number of viral copies(<138 copies/mL). A negative result must be combined with clinical observations, patient history, and epidemiological information. The expected result is Negative.  Fact Sheet for Patients:  EntrepreneurPulse.com.au  Fact Sheet for Healthcare Providers:  IncredibleEmployment.be  This test is no t yet approved or cleared by the [place] FDA and  has been authorized for detection and/or diagnosis of SARS-CoV-2 by FDA under an Emergency Use Authorization (EUA). This EUA will remain  in effect (meaning this test can be used) for the duration of the COVID-19 declaration under Section 564(b)(1) of the Act, 21 U.S.C.section 360bbb-3(b)(1), unless the authorization is terminated  or revoked sooner.       Influenza A by PCR NEGATIVE NEGATIVE Final   Influenza B by PCR NEGATIVE NEGATIVE Final    Comment: (NOTE) The Xpert Xpress SARS-CoV-2/FLU/RSV plus assay is intended as an aid in the diagnosis of influenza from Nasopharyngeal swab specimens and should not be used as a sole basis for treatment. Nasal washings and aspirates are unacceptable for Xpert Xpress  SARS-CoV-2/FLU/RSV testing.  Fact Sheet for Patients: EntrepreneurPulse.com.au  Fact Sheet for Healthcare Providers: IncredibleEmployment.be  This test is not yet approved or cleared by the [place] FDA and has been authorized for detection and/or diagnosis of SARS-CoV-2 by FDA under an Emergency Use Authorization (EUA). This EUA will remain in effect (meaning this test can be used) for the duration of the COVID-19 declaration under Section 564(b)(1) of the Act, 21 U.S.C. section 360bbb-3(b)(1), unless the authorization is terminated or revoked.  Performed at [facility], [address] [place], [place] [address]   Blood Culture (routine x 2)     Status: None (Preliminary result)   Collection Time: 11/12/20  9:08 PM   Specimen: BLOOD  Result Value Ref Range Status   Specimen Description BLOOD BLOOD LEFT WRIST  Final   Special Requests   Final    BOTTLES DRAWN AEROBIC AND ANAEROBIC Blood Culture adequate volume   Culture   Final    NO GROWTH 4 DAYS Performed at [facility], [address] [place], [place] [address]    Report Status PENDING  Incomplete  MRSA PCR Screening     Status: Abnormal   Collection Time: 11/13/20  1:05 AM   Specimen: Nasopharyngeal  Result Value Ref Range Status   MRSA by PCR POSITIVE (A) NEGATIVE Final    Comment:        The GeneXpert MRSA Assay (FDA approved for NASAL specimens only), is one component of a comprehensive MRSA colonization surveillance program. It is not intended to diagnose MRSA infection nor to guide or monitor treatment for MRSA infections. RESULT CALLED TO, READ BACK BY AND VERIFIED WITH: TESS THOMAS ON 11/13/20 AT 0224 QSD Performed at [facility], [address], [place], [place] [address]          Radiology Studies: No results found.      Scheduled Meds: . allopurinol  600 mg Oral BID  . aspirin EC  81 mg Oral Daily  .  buprenorphine-naloxone  1 tablet Sublingual TID  . Chlorhexidine Gluconate Cloth  6 each Topical Q0600  . cholecalciferol  1,000 Units Oral Daily  . clopidogrel  75 mg Oral Daily  . DULoxetine  30 mg Oral Daily  . enoxaparin (LOVENOX) injection  40 mg Subcutaneous Q24H  . ezetimibe  10 mg Oral Daily  . ferrous sulfate  325 mg Oral Q breakfast  . fluticasone  2 spray Each Nare Daily  . fluticasone furoate-vilanterol  1 puff Inhalation Daily   And  . umeclidinium bromide  1 puff Inhalation Daily  . gabapentin  800 mg Oral QID  . hydrochlorothiazide  25 mg Oral Daily  . insulin aspart  0-20 Units Subcutaneous TID WC  . ipratropium-albuterol  3 mL Nebulization BID  . linagliptin  5 mg Oral Daily  . metFORMIN  1,000 mg Oral BID WC  . metoprolol succinate  50 mg Oral Daily  . mupirocin ointment  1 application Nasal BID  . pantoprazole  40 mg Oral Daily  . simvastatin  40 mg Oral q1800   Continuous Infusions: . cefTRIAXone (ROCEPHIN)  IV 2 g (11/15/20 2109)  . 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 He was admitted in April '21 for CAP. Due to his progressive SOB he called EMS. In route he was given Nebulizer treatment and IV steroids for his COPD symptoms.  Severe sepsis present on admission -- patient came in with fever shortness of breath and chest x-ray revealed multifocal airspace disease consistent with pneumonia -- fever of 100.4, tachycardia, tachypnea, lactic acid   2.2 --MRSA + Sepsis resolved. 12/9-continue IV Vanco and Rocephin  PCCM following  Serology pending  I-S, nebs  Try to wean down oxygen, unfortunately at rest still requiring 4  L  Continue  We will follow up with any further recommendations from pulmonary    Acute exacerbation of COPD chronic/end-stage emphysema/chronic respiratory failure/chronic oxygen Iv steroid d/c'd pccm following Nebs  Iv abx PT/OT to mobilize pt  Still requiring higher 02 than baseline at rest Per pulmonary will need to initiate Roflumilast on outpatient basis due to chronic bronchitis COPD   Leukocytosis Afebrile  Improving, WBC 12.0  Likely was from IV steroids which was discontinued.     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 Danelle Earthly Primary oncologist is Dr. Janese Banks     Chronic narcotic dependence -- patient on Suboxone-- follows withDr. Jeanice Lim --Continue Suboxone    Type II diabetes, stable sugars -- last A1c 5.4 in April 2021 --BG stable.   Hypertension continue beta-blockers and hydrochlorothiazide  Hyperlipidemia continue statins  H/o Gout  continue allopurinol   DVT prophylaxis: lovenox Code Status: full Family Communication: none at bedside Status is: Inpatient  Remains inpatient appropriate because:Inpatient level of care appropriate due to severity of illness   Dispo: The patient is from: Home              Anticipated d/c is to: Home              Anticipated d/c date is: 2 days              Patient currently is not medically stable to d/c.Slow improvement. On IVabx. Still requiring higher than baslien 02 at rest            LOS: 4 days   Time spent: 35 minutes with more than 50% on Erda, MD [facility] Pager [phone]  If 7PM-7AM, please contact night-coverage 11/16/2020, 8:31 AM

## 2020-11-16 NOTE — Plan of Care (Signed)

## 2020-11-16 NOTE — Progress Notes (Cosign Needed)
Mr. Christian Fields has CRF secondary to COPD, and needs Non-Invasive Ventilator to reduce hospitalizations and sustain life.

## 2020-11-16 NOTE — Consult Note (Signed)
Pharmacy Antibiotic Note  Christian Fields is a 64 y.o. male with medical history including emphysema on home oxygen, NSCLC s/p XRT admitted on 11/12/2020 with pneumonia. Labs on admission notable for marked leukocytosis with WBC 52.6 with associated left shift on differential, PCT 5.68. Vitals notable for low grade fever and ST. Patient was started on empiric antibiotic therapy with ceftriaxone + azithromycin.  Pharmacy has been consulted for vancomycin dosing.  Plan: Vancomycin 1.75 g IV LD x 1 followed by maintenance regimen of 750 mg IV q12h per nomogram. Per discussion on rounds, medical team plans for 5 days of abx therapy. Order level is therapy is continued further.  --Goal trough 15-20 mcg/mL for pneumonia --Daily Scr per protocol  Also on ceftriaxone 2 g daily   Height: 5\' 8"  (172.7 cm) Weight: 80.8 kg (178 lb 1.6 oz) IBW/kg (Calculated) : 68.4  Temp (24hrs), Avg:98.1 F (36.7 C), Min:97.8 F (36.6 C), Max:98.3 F (36.8 C)  Recent Labs  Lab 11/12/20 2108 11/12/20 2314 11/13/20 0533 11/13/20 1729 11/13/20 2059 11/14/20 0417 11/15/20 0536 11/16/20 0534  WBC 52.6*  --  51.2*  --   --   --  20.4* 12.0*  CREATININE 1.08  --  1.20  --   --  1.27*  --  1.11  LATICACIDVEN 1.7 2.4*  --  1.5 1.6  --   --   --     Estimated Creatinine Clearance: 64.2 mL/min (by C-G formula based on SCr of 1.11 mg/dL).    Allergies  Allergen Reactions  . Bee Venom Anaphylaxis  . Hydrocodone-Acetaminophen Itching    Antimicrobials this admission: Ceftriaxone 12/5 >> 12/6 Azithromycin 12/5 >>  Vancomycin 12/6 >>   Dose adjustments this admission: N/A  Microbiology results: 12/5 BCx: NGTD 12/6 Sputum: pending  12/6 MRSA PCR: (+)  Thank you for allowing pharmacy to be a part of this patient's care.  Oswald Hillock 11/16/2020 8:58 AM

## 2020-11-17 DIAGNOSIS — F17200 Nicotine dependence, unspecified, uncomplicated: Secondary | ICD-10-CM

## 2020-11-17 LAB — CREATININE, SERUM
Creatinine, Ser: 1.06 mg/dL (ref 0.61–1.24)
GFR, Estimated: >60 mL/min (ref >60)

## 2020-11-17 LAB — CULTURE, BLOOD (ROUTINE X 2)
Culture: NO GROWTH
Culture: NO GROWTH
Special Requests: ADEQUATE
Special Requests: ADEQUATE

## 2020-11-17 LAB — GLUCOSE, CAPILLARY
Glucose-Capillary: 75 mg/dL (ref 70–99)
Glucose-Capillary: 134 mg/dL — ABNORMAL HIGH (ref 70–99)
Glucose-Capillary: 58 mg/dL — ABNORMAL LOW (ref 70–99)
Glucose-Capillary: 160 mg/dL — ABNORMAL HIGH (ref 70–99)
Glucose-Capillary: 80 mg/dL (ref 70–99)

## 2020-11-17 NOTE — Evaluation (Signed)
Occupational Therapy Evaluation Patient Details Name: Christian Fields MRN: [id number] DOB: 1956-05-22 Today's Date: 11/17/2020    History of Present Illness Christian Fields is a 64 y.o. 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 He was admitted in April '21 for CAP. Due to his progressive SOB he called EMS. In route he was given Nebulizer treatment and IV steroids for his COPD symptoms.   Clinical Impression   Patient presenting with decreased I in self care, balance, functional mobility/transfer, endurance, and safety awareness.  Patient reports living with son and son's gf in mobile home  PTA. He uses a RW on "bad day". Pt on 4L O2 via [place] with O2 saturation of 88-90%. Pt standing and ambulating to bathroom and attempting to remove oxygen and reports, " I always take it off when I go in the bathroom" and OT educating pt on need for it be on based on current vitals for safety. Pt ambulating and performing toileting needs with min guard - min A without use of RW. Pt ambulating 10' and pausing in hallway and reports feeling SOB. O2 not reading and O2 increased to 6 Ls. Pt ambulating 150' with min guard and returning to room with O2 desaturation to 80% and needing increased time with cuing for pursed lip breathing to increase to 90%. After several minutes, RN able to return pt to 4L O2 at rest.  Patient currently functioning at supervision - min A . Patient will benefit from acute OT to increase overall independence in the areas of ADLs, functional mobility, and safety awareness in order to safely discharge home with family.    Follow Up Recommendations  No OT follow up;Supervision - Intermittent    Equipment Recommendations  None recommended by OT    Recommendations for Other Services Other (comment) (none at this time)     Precautions / Restrictions  Precautions Precautions: Fall      Mobility Bed Mobility Overal bed mobility: Needs Assistance Bed Mobility: Supine to Sit;Sit to Supine     Supine to sit: Supervision Sit to supine: Supervision   General bed mobility comments: min cuing for technique    Transfers Overall transfer level: Needs assistance Equipment used: None Transfers: Sit to/from [facility] Sit to Stand: Min guard Stand pivot transfers: Min guard            Balance Overall balance assessment: Needs assistance Sitting-balance support: Feet supported Sitting balance-Leahy Scale: Good     Standing balance support: During functional activity Standing balance-Leahy Scale: Fair                             ADL either performed or assessed with clinical judgement   ADL Overall ADL's : Needs assistance/impaired     Grooming: Wash/dry hands;Wash/dry face;Oral care;Min guard;Standing               Lower Body Dressing: Supervision/safety;Sit to/from stand   Toilet Transfer: Min guard;Grab bars   Toileting- Water quality scientist and Hygiene: Min guard;Sit to/from stand       Functional mobility during ADLs: Min guard       Vision Baseline Vision/History: Wears glasses Wears Glasses: Reading only Patient Visual Report: No change from baseline              Pertinent Vitals/Pain Pain  Assessment: No/denies pain Faces Pain Scale: No hurt     Hand Dominance Right   Extremity/Trunk Assessment Upper Extremity Assessment Upper Extremity Assessment: Overall WFL for tasks assessed   Lower Extremity Assessment Lower Extremity Assessment: Overall WFL for tasks assessed       Communication Communication Communication: No difficulties   Cognition Arousal/Alertness: Awake/alert Behavior During Therapy: WFL for tasks assessed/performed Overall Cognitive Status: Within Functional Limits for tasks assessed                                                 Home Living Family/patient expects to be discharged to:: Private residence Living Arrangements: Children (lives with son and son's gf) Available Help at Discharge: Family;Available PRN/intermittently Type of Home: Mobile home Home Access: Stairs to enter Entrance Stairs-Number of Steps: 5-6 Entrance Stairs-Rails: Right Home Layout: One level     Bathroom Shower/Tub: Teacher, early years/pre: Standard     Home Equipment: Environmental consultant - 2 wheels;Shower seat   Additional Comments: Pt reports he only uses RW when he is having a "bad day"      Prior Functioning/Environment Level of Independence: Independent with assistive device(s)                 OT Problem List: Cardiopulmonary status limiting activity;Impaired balance (sitting and/or standing);Decreased safety awareness;Decreased activity tolerance;Decreased strength;Decreased knowledge of use of DME or AE      OT Treatment/Interventions: Self-care/ADL training;Therapeutic exercise;Balance training;Therapeutic activities;Energy conservation;DME and/or AE instruction;Patient/family education    OT Goals(Current goals can be found in the care plan section) Acute Rehab OT Goals Patient Stated Goal: to go home OT Goal Formulation: With patient Time For Goal Achievement: 12/01/20 Potential to Achieve Goals: Good ADL Goals Pt Will Perform Grooming: with modified independence;standing Pt Will Transfer to Toilet: with modified independence;ambulating Pt Will Perform Toileting - Clothing Manipulation and hygiene: with modified independence;sit to/from stand Pt Will Perform Tub/Shower Transfer: with modified independence;ambulating  OT Frequency: Min 1X/week   Barriers to D/C:    none at this time          AM-PAC OT "6 Clicks" Daily Activity     Outcome Measure Help from another person eating meals?: None Help from another person taking care of personal grooming?: None Help from another person toileting,  which includes using toliet, bedpan, or urinal?: A Little Help from another person bathing (including washing, rinsing, drying)?: None Help from another person to put on and taking off regular upper body clothing?: None Help from another person to put on and taking off regular lower body clothing?: A Little 6 Click Score: 22   End of Session Equipment Utilized During Treatment: Oxygen;Rolling walker Nurse Communication: Mobility status;Other (comment) (oxygen)  Activity Tolerance: Patient tolerated treatment well Patient left: in bed;with call bell/phone within reach;with bed alarm set  OT Visit Diagnosis: Unsteadiness on feet (R26.81);Muscle weakness (generalized) (M62.81)                Time: 1700-1749 OT Time Calculation (min): 32 min Charges:  OT General Charges $OT Visit: 1 Visit OT Treatments $Self Care/Home Management : 8-22 mins  Darleen Crocker, MS, OTR/L , CBIS ascom [phone]  11/17/20, 2:25 PM

## 2020-11-17 NOTE — Plan of Care (Signed)

## 2020-11-17 NOTE — Progress Notes (Signed)
Mobility Specialist - Progress Note   11/17/20 1300  Mobility  Activity Ambulated in hall;Ambulated to bathroom  Level of Assistance Standby assist, set-up cues, supervision of patient - no hands on  Assistive Device None  Distance Ambulated (ft) 180 ft  Mobility Response Tolerated well  Mobility performed by Mobility specialist  $Mobility charge 1 Mobility    Pre-mobility: 83 HR, 90% SpO2 During mobility: 98 HR, 76% SpO2 Post-mobility: 83 HR, 95% SpO2   Pt was lying in bed upon arrival utilizing 4L [place] O2. Pt agreed to session. Pt denied any pain, nausea, or fatigue. Pt ambulated to bathroom prior to further activity. Pt was able to perform self peri-care. Pt proceeded to ambulate into hallway without AD and close supervision. No noted LOB. Pt denied dizziness or weakness. Pt's O2 desat to 76% while on 4L. Pt was instructed to utilize PLB technique, but poor carryover as he continues to carry out conversation. Upon return to room, pt c/o feeling slightly SOB. O2 sat up to 95% with rest in supine position. Nurse notified.    Kathee Delton Mobility Specialist 11/17/20, 1:45 PM

## 2020-11-17 NOTE — Progress Notes (Signed)
PROGRESS NOTE    Christian Fields  AOZ:[id number] DOB: [birth year] DOA: 11/12/2020 PCP: Remi Haggard, FNP    Brief Narrative:  Came in with increasing shortness of breath and increase oxygen requirement. Low-grade fever.  12/8- on 4L 02 sat 90 12/8-still 4L with 02 sat 90% at rest.Baseline 02 at home 2L 12/10: at rest on 4L , with ambulation required 6L as on 4L dropped to 75%. Per nsg report to me.    Consultants:   pccm  Procedures:   Antimicrobials:   Vancomycin, ceftriaxone    Subjective: Sob with ambulation but better with rest. No cp . Overall starting to feel better.  Objective: Vitals:   11/16/20 1627 11/16/20 1926 11/17/20 0507 11/17/20 0812  BP: 122/72 119/70 111/66 124/82  Pulse: 81 82 68 75  Resp: 18 19 15 18   Temp: 97.8 F (36.6 C) 97.8 F (36.6 C) 99 F (37.2 C) 98.3 F (36.8 C)  TempSrc:   Oral Oral  SpO2: 91% 93% 93% 92%  Weight:   80.2 kg   Height:        Intake/Output Summary (Last 24 hours) at 11/17/2020 0815 Last data filed at 11/17/2020 0057 Gross per 24 hour  Intake 2582.5 ml  Output 975 ml  Net 1607.5 ml   Filed Weights   11/15/20 0306 11/16/20 0612 11/17/20 0507  Weight: 78.1 kg 80.8 kg 80.2 kg    Examination: Calm, comfortable Course mildly rhonchorous breath sounds, increased expiratory time no wheezing Regular S1-S2 no murmurs Soft benign positive bowel sounds No edema Alert oriented x3, grossly intact     Data Reviewed: I have personally reviewed following labs and imaging studies  CBC: Recent Labs  Lab 11/12/20 2108 11/13/20 0533 11/15/20 0536 11/16/20 0534  WBC 52.6* 51.2* 20.4* 12.0*  NEUTROABS 48.2*  --   --   --   HGB 10.8* 10.1* 9.0* 10.8*  HCT 35.8* 33.3* 30.0* 35.6*  MCV 93.2 94.3 93.2 94.2  PLT 583* 480* 505* 962*   Basic Metabolic Panel: Recent Labs  Lab 11/12/20 2108 11/13/20 0533 11/14/20 0417 11/16/20 0534 11/17/20 0508  NA 137 138  --  140  --   K 3.3* 4.2  --  4.0  --   CL 100  97*  --  98  --   CO2 26 27  --  31  --   GLUCOSE 191* 245*  --  90  --   BUN 11 15  --  29*  --   CREATININE 1.08 1.20 1.27* 1.11 1.06  CALCIUM 8.7* 9.0  --  9.5  --    GFR: Estimated Creatinine Clearance: 67.2 mL/min (by C-G formula based on SCr of 1.06 mg/dL). Liver Function Tests: Recent Labs  Lab 11/12/20 2108  AST 15  ALT 12  ALKPHOS 108  BILITOT 0.5  PROT 7.3  ALBUMIN 2.9*   No results for input(s): LIPASE, AMYLASE in the last 168 hours. No results for input(s): AMMONIA in the last 168 hours. Coagulation Profile: Recent Labs  Lab 11/12/20 2108  INR 1.1   Cardiac Enzymes: No results for input(s): CKTOTAL, CKMB, CKMBINDEX, TROPONINI in the last 168 hours. BNP (last 3 results) No results for input(s): PROBNP in the last 8760 hours. HbA1C: No results for input(s): HGBA1C in the last 72 hours. CBG: Recent Labs  Lab 11/16/20 0900 11/16/20 1127 11/16/20 1626 11/16/20 2110 11/17/20 0810  GLUCAP 103* 112* 110* 91 75   Lipid Profile: No results for input(s): CHOL, HDL, LDLCALC,  TRIG, CHOLHDL, LDLDIRECT in the last 72 hours. Thyroid Function Tests: No results for input(s): TSH, T4TOTAL, FREET4, T3FREE, THYROIDAB in the last 72 hours. Anemia Panel: No results for input(s): VITAMINB12, FOLATE, FERRITIN, TIBC, IRON, RETICCTPCT in the last 72 hours. Sepsis Labs: Recent Labs  Lab 11/12/20 2108 11/12/20 2314 11/13/20 1600 11/13/20 1729 11/13/20 2059 11/14/20 0417  PROCALCITON 5.68  --  14.16  --   --  14.16  LATICACIDVEN 1.7 2.4*  --  1.5 1.6  --     Recent Results (from the past 240 hour(s))  Blood Culture (routine x 2)     Status: None   Collection Time: 11/12/20  9:07 PM   Specimen: BLOOD  Result Value Ref Range Status   Specimen Description BLOOD RIGHT ANTECUBITAL  Final   Special Requests   Final    BOTTLES DRAWN AEROBIC AND ANAEROBIC Blood Culture adequate volume   Culture   Final    NO GROWTH 5 DAYS Performed at [facility], [address] [place], [place] [address]    Report Status 11/17/2020 FINAL  Final  Resp Panel by RT-PCR (Flu A&B, Covid) Nasopharyngeal Swab     Status: None   Collection Time: 11/12/20  9:08 PM   Specimen: Nasopharyngeal Swab; Nasopharyngeal(NP) swabs in vial transport medium  Result Value Ref Range Status   SARS Coronavirus 2 by RT PCR NEGATIVE NEGATIVE Final    Comment: (NOTE) SARS-CoV-2 target nucleic acids are NOT DETECTED.  The SARS-CoV-2 RNA is generally detectable in upper respiratory specimens during the acute phase of infection. The lowest concentration of SARS-CoV-2 viral copies this assay can detect is 138 copies/mL. A negative result does not preclude SARS-Cov-2 infection and should not be used as the sole basis for treatment or other patient management decisions. A negative result may occur with  improper specimen collection/handling, submission of specimen other than nasopharyngeal swab, presence of viral mutation(s) within the areas targeted by this assay, and inadequate number of viral copies(<138 copies/mL). A negative result must be combined with clinical observations, patient history, and epidemiological information. The expected result is Negative.  Fact Sheet for Patients:  EntrepreneurPulse.com.au  Fact Sheet for Healthcare Providers:  IncredibleEmployment.be  This test is no t yet approved or cleared by the [place] FDA and  has been authorized for detection and/or diagnosis of SARS-CoV-2 by FDA under an Emergency Use Authorization (EUA). This EUA will remain  in effect (meaning this test can be used) for the duration of the COVID-19 declaration under Section 564(b)(1) of the Act, 21 U.S.C.section 360bbb-3(b)(1), unless the authorization is terminated  or revoked sooner.       Influenza A by PCR NEGATIVE NEGATIVE Final   Influenza B by PCR NEGATIVE NEGATIVE Final    Comment: (NOTE) The Xpert Xpress SARS-CoV-2/FLU/RSV  plus assay is intended as an aid in the diagnosis of influenza from Nasopharyngeal swab specimens and should not be used as a sole basis for treatment. Nasal washings and aspirates are unacceptable for Xpert Xpress SARS-CoV-2/FLU/RSV testing.  Fact Sheet for Patients: EntrepreneurPulse.com.au  Fact Sheet for Healthcare Providers: IncredibleEmployment.be  This test is not yet approved or cleared by the [place] FDA and has been authorized for detection and/or diagnosis of SARS-CoV-2 by FDA under an Emergency Use Authorization (EUA). This EUA will remain in effect (meaning this test can be used) for the duration of the COVID-19 declaration under Section 564(b)(1) of the Act, 21 U.S.C. section 360bbb-3(b)(1), unless the authorization is terminated or revoked.  Performed at [facility], [address] [place], [place] [address]   Blood Culture (routine x 2)     Status: None   Collection Time: 11/12/20  9:08 PM   Specimen: BLOOD  Result Value Ref Range Status   Specimen Description BLOOD BLOOD LEFT WRIST  Final   Special Requests   Final    BOTTLES DRAWN AEROBIC AND ANAEROBIC Blood Culture adequate volume   Culture   Final    NO GROWTH 5 DAYS Performed at [facility], [address] [place], [place] [address]    Report Status 11/17/2020 FINAL  Final  MRSA PCR Screening     Status: Abnormal   Collection Time: 11/13/20  1:05 AM   Specimen: Nasopharyngeal  Result Value Ref Range Status   MRSA by PCR POSITIVE (A) NEGATIVE Final    Comment:        The GeneXpert MRSA Assay (FDA approved for NASAL specimens only), is one component of a comprehensive MRSA colonization surveillance program. It is not intended to diagnose MRSA infection nor to guide or monitor treatment for MRSA infections. RESULT CALLED TO, READ BACK BY AND VERIFIED WITH: TESS THOMAS ON 11/13/20 AT 0224 QSD Performed at [facility], [address] [place], [place] [address]          Radiology Studies: DG Chest [place] 1 View  Result Date: 11/16/2020 CLINICAL DATA:  Respiratory distress, abnormal chest x-ray, history asthma, COPD, diabetes mellitus, hypertension EXAM: PORTABLE CHEST 1 VIEW COMPARISON:  Portable exam 0656 hours compared to 11/13/2020 FINDINGS: Normal heart size, mediastinal contours, and pulmonary vascularity. Atherosclerotic calcification aorta. Emphysematous and bronchitic changes consistent with COPD. Scattered chronic interstitial lung changes with superimposed acute infiltrate again identified in the RIGHT upper lobe, less at RIGHT base. No pleural effusion or pneumothorax. Bones demineralized. IMPRESSION: Changes of COPD and chronic interstitial lung disease with persistent superimposed RIGHT lung infiltrates. Electronically Signed   By: Lavonia Dana M.D.   On: 11/16/2020 08:34        Scheduled Meds: . allopurinol  600 mg Oral BID  . aspirin EC  81 mg Oral Daily  . buprenorphine-naloxone  1 tablet Sublingual TID  . Chlorhexidine Gluconate Cloth  6 each Topical Q0600  . cholecalciferol  1,000 Units Oral Daily  . clopidogrel  75 mg Oral Daily  . DULoxetine  30 mg Oral Daily  . enoxaparin (LOVENOX) injection  40 mg Subcutaneous Q24H  . ezetimibe  10 mg Oral Daily  . ferrous sulfate  325 mg Oral Q breakfast  . fluticasone  2 spray Each Nare Daily  . fluticasone furoate-vilanterol  1 puff Inhalation Daily   And  . umeclidinium bromide  1 puff Inhalation Daily  . gabapentin  800 mg Oral QID  . hydrochlorothiazide  25 mg Oral Daily  . insulin aspart  0-20 Units Subcutaneous TID WC  . ipratropium-albuterol  3 mL Nebulization BID  . linagliptin  5 mg Oral Daily  . metFORMIN  1,000 mg Oral BID WC  . metoprolol succinate  50 mg Oral Daily  . mupirocin ointment  1 application Nasal BID  . pantoprazole  40 mg Oral Daily  . simvastatin  40 mg Oral q1800   Continuous Infusions: . cefTRIAXone  (ROCEPHIN)  IV 2 g (11/16/20 2103)  . 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 He was admitted in April '21 for CAP. Due to his progressive SOB he called EMS. In route he was given Nebulizer treatment and IV steroids for his COPD symptoms.  Severe sepsis present on admission -- patient came in with fever shortness of breath and chest x-ray revealed multifocal airspace disease consistent with pneumonia -- fever of 100.4, tachycardia, tachypnea, lactic acid  2.2 --MRSA + Sepsis resolved. 12/10-continue IV vancomycin and Rocephin  Serology pending  PCCM following, chest x-ray reviewed Incentive spirometer, nebs  Unable try to wean patient down less than 4 L and requiring up to 6 L with ambulation .  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 Danelle Earthly  Primary oncologist is Dr. Janese Banks        Chronic narcotic dependence -- patient on Suboxone-- follows withDr. Jeanice Lim --Continue Suboxone    Type II diabetes, stable sugars -- last A1c 5.4 in April 2021 Blood glucose stable  Hypertension continue beta-blockers and hydrochlorothiazide  Hyperlipidemia continue statins  H/o Gout  continue allopurinol   DVT prophylaxis: lovenox Code Status: full Family Communication: none at bedside Status is: Inpatient  Remains inpatient appropriate because:Inpatient level of care appropriate due to severity of illness   Dispo: The patient is from: Home              Anticipated d/c is to: Home              Anticipated d/c date is: 2 days              Patient currently is not medically stable to d/c.Slow improvement. On IVabx. Still requiring higher than baslien 02 at rest and higher even with ambulation high risk of readmission if discharged prematurely            LOS: 5 days   Time spent: 35 minutes with more than 50% on [place], MD [facility] Pager [phone]  If 7PM-7AM, please contact night-coverage 11/17/2020, 8:15 AM

## 2020-11-17 NOTE — Progress Notes (Signed)
Pulmonary Medicine          Date: 11/17/2020,   MRN# [id number] Christian Fields 08-Sep-1956     AdmissionWeight: 70.8 kg                 CurrentWeight: 80.2 kg   Referring physician: Dr. 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 He shares worsening shortness of breath increased O2 requirement and phlegm production. He received steroids and nebulizer therapy and shares that he has improved. On admission he was found to be febrile tachypneic and tachycardic with wheezing bilaterally he was given a liter of fluid as well as typical community-acquired pneumonia therapy with Zithromax and Rocephin. During my evaluation patient states that he is improved slightly however remains on 5 L/min nasal cannula supplemental oxygen. He has elevated leukocytosis at 51,000.     11/14/20- patient reports feeling better.  We reviewed his hospital course and addition of vancomycin due to MRSA +.  He shares he had MRSA pneumonia in the past.  Microbiology, resp cultures and serology has not returned with results yet.   11/15/20-  Patient is improved significatnly , his wheezing is almost resolved.  Blodwork with trending down leukocytosis. From  51K >>21.  O2 weaned to 4L/min from 5.  11/16/20- patietn is resting in bed in no distress.  Lung ausculatation is significantly improved. WBC count trending down well.  11/17/20- patient is sitting up in bed in NAD.  Family at bedside.  We reviewed CXR and hospital course.  Patient is improving . Anticipate DC soon per [name].    PAST MEDICAL HISTORY    Past Medical History:  Diagnosis Date  . Anemia   . Anxiety   . Arthritis   . Asthma   . Cancer ([place])    Basal Cell Skin Cancer  . Chronic back pain   . COPD (chronic obstructive pulmonary disease) ([place])   . Depression   . Diabetes mellitus ([place])   . Dyspnea   . GERD (gastroesophageal reflux disease)   . Gout   . Gout   . Headache   . History of blood clots    Left Leg--July 2018  . History of kidney stones   . Hyperlipidemia   . Hyperlipidemia   . Hypertension   . Kidney stones   . Neuropathy   . On home oxygen therapy    2 L / M  . Pneumonia 06/2017  . Sleep apnea   . Ulcer of foot ([place])    Right     SURGICAL HISTORY   Past Surgical History:  Procedure Laterality Date  . APPENDECTOMY    . DG FEET 2 VIEWS BILAT    . LIPOMA EXCISION Right 08/15/2017   Procedure: EXCISION TUMOR(CYST) FOOT;  Surgeon: Albertine Patricia, DPM;  Location: [facility] ORS;  Service: Podiatry;  Laterality: Right;  . OTHER SURGICAL HISTORY Bilateral Foot surgery     FAMILY HISTORY   Family History  Problem Relation Age of Onset  . Hypertension Mother      SOCIAL HISTORY   Social  History   Tobacco Use  . Smoking status: Former Smoker    Packs/day: 0.50    Types: Cigarettes    Start date: 2018  . Smokeless tobacco: Never Used  Vaping Use  . Vaping Use: Never used  Substance Use Topics  . Alcohol use: Yes    Alcohol/week: 1.0 standard drink    Types: 1 Cans of beer per week    Comment: occ  . Drug use: No     MEDICATIONS    Home Medication:    Current Medication:  Current Facility-Administered Medications:  .  acetaminophen (TYLENOL) tablet 650 mg, 650 mg, Oral, Q6H PRN, Norins, Heinz Knuckles, MD, 650 mg at 11/15/20 2221 .  allopurinol (ZYLOPRIM) tablet 600 mg, 600 mg, Oral, BID, Norins, Heinz Knuckles, MD, 600 mg at 11/17/20 0954 .  aspirin EC tablet 81 mg, 81 mg, Oral, Daily, Norins, Heinz Knuckles, MD, 81 mg at 11/17/20 0948 .  buprenorphine-naloxone (SUBOXONE) 8-2 mg per SL  tablet 1 tablet, 1 tablet, Sublingual, TID, 1 tablet at 11/17/20 0949 **AND** [DISCONTINUED] buprenorphine-naloxone (SUBOXONE) 2-0.5 mg per SL tablet 2 tablet, 2 tablet, Sublingual, TID, Fritzi Mandes, MD, 2 tablet at 11/13/20 2132 .  cefTRIAXone (ROCEPHIN) 2 g in sodium chloride 0.9 % 100 mL IVPB, 2 g, Intravenous, Q24H, Norins, Heinz Knuckles, MD, Stopped at 11/16/20 2133 .  Chlorhexidine Gluconate Cloth 2 % PADS 6 each, 6 each, Topical, Q0600, Sharion Settler, NP .  cholecalciferol (VITAMIN D3) tablet 1,000 Units, 1,000 Units, Oral, Daily, Norins, Heinz Knuckles, MD, 1,000 Units at 11/17/20 0949 .  clopidogrel (PLAVIX) tablet 75 mg, 75 mg, Oral, Daily, Norins, Heinz Knuckles, MD, 75 mg at 11/17/20 0949 .  DULoxetine (CYMBALTA) DR capsule 30 mg, 30 mg, Oral, Daily, Norins, Heinz Knuckles, MD, 30 mg at 11/17/20 0949 .  enoxaparin (LOVENOX) injection 40 mg, 40 mg, Subcutaneous, Q24H, Norins, Heinz Knuckles, MD, 40 mg at 11/16/20 2217 .  ezetimibe (ZETIA) tablet 10 mg, 10 mg, Oral, Daily, Norins, Heinz Knuckles, MD, 10 mg at 11/17/20 0948 .  ferrous sulfate tablet 325 mg, 325 mg, Oral, Q breakfast, Norins, Heinz Knuckles, MD, 325 mg at 11/17/20 0814 .  fluticasone (FLONASE) 50 MCG/ACT nasal spray 2 spray, 2 spray, Each Nare, Daily, Norins, Heinz Knuckles, MD, 2 spray at 11/17/20 0950 .  fluticasone furoate-vilanterol (BREO ELLIPTA) 100-25 MCG/INH 1 puff, 1 puff, Inhalation, Daily, 1 puff at 11/17/20 0812 **AND** umeclidinium bromide (INCRUSE ELLIPTA) 62.5 MCG/INH 1 puff, 1 puff, Inhalation, Daily, Norins, Heinz Knuckles, MD, 1 puff at 11/17/20 [phone] .  gabapentin (NEURONTIN) capsule 800 mg, 800 mg, Oral, QID, Norins, Heinz Knuckles, MD, 800 mg at 11/17/20 1418 .  hydrochlorothiazide (HYDRODIURIL) tablet 25 mg, 25 mg, Oral, Daily, Norins, Heinz Knuckles, MD, 25 mg at 11/17/20 0949 .  insulin aspart (novoLOG) injection 0-20 Units, 0-20 Units, Subcutaneous, TID WC, Norins, Heinz Knuckles, MD, 3 Units at 11/17/20 1248 .  ipratropium-albuterol (DUONEB) 0.5-2.5 (3)  MG/3ML nebulizer solution 3 mL, 3 mL, Nebulization, BID, Fritzi Mandes, MD, 3 mL at 11/17/20 0831 .  linagliptin (TRADJENTA) tablet 5 mg, 5 mg, Oral, Daily, Norins, Heinz Knuckles, MD, 5 mg at 11/17/20 0949 .  metFORMIN (GLUCOPHAGE-XR) 24 hr tablet 1,000 mg, 1,000 mg, Oral, BID WC, Norins, Heinz Knuckles, MD, 1,000 mg at 11/17/20 0815 .  metoprolol succinate (TOPROL-XL) 24 hr tablet 50 mg, 50 mg, Oral, Daily, Norins, Heinz Knuckles, MD, 50 mg at 11/17/20 0949 .  pantoprazole (PROTONIX) EC tablet 40 mg, 40 mg, Oral, Daily, Norins, Legrand Como  E, MD, 40 mg at 11/17/20 0948 .  simvastatin (ZOCOR) tablet 40 mg, 40 mg, Oral, q1800, Norins, Heinz Knuckles, MD, 40 mg at 11/16/20 1700 .  traZODone (DESYREL) tablet 25 mg, 25 mg, Oral, QHS PRN, Norins, Heinz Knuckles, MD, 25 mg at 11/15/20 2108 .  vancomycin (VANCOREADY) IVPB 750 mg/150 mL, 750 mg, Intravenous, Q12H, Nolberto Hanlon, MD, Last Rate: 150 mL/hr at 11/17/20 1020, 750 mg at 11/17/20 1020    ALLERGIES   Bee venom and Hydrocodone-acetaminophen     REVIEW OF SYSTEMS    Review of Systems:  Gen:  Denies  fever, sweats, chills weigh loss  HEENT: Denies blurred vision, double vision, ear pain, eye pain, hearing loss, nose bleeds, sore throat Cardiac:  No dizziness, chest pain or heaviness, chest tightness,edema Resp:   D cough, phlegm production and wheezing bilateral Gi: Denies swallowing difficulty, stomach pain, nausea or vomiting, diarrhea, constipation, bowel incontinence Gu:  Denies bladder incontinence, burning urine Ext:   Denies Joint pain, stiffness or swelling Skin: Denies  skin rash, easy bruising or bleeding or hives Endoc:  Denies polyuria, polydipsia , polyphagia or weight change Psych:   Denies depression, insomnia or hallucinations   Other:  All other systems negative   VS: BP 129/72 (BP Location: Left Arm)   Pulse 80   Temp 98.2 F (36.8 C) (Oral)   Resp 19   Ht 5\' 8"  (1.727 m)   Wt 80.2 kg   SpO2 90%   BMI 26.88 kg/m      PHYSICAL EXAM     GENERAL:NAD, no fevers, chills, no weakness no fatigue HEAD: Normocephalic, atraumatic.  EYES: Pupils equal, round, reactive to light. Extraocular muscles intact. No scleral icterus.  MOUTH: Moist mucosal membrane. Dentition intact. No abscess noted.  EAR, NOSE, THROAT: Clear without exudates. No external lesions.  NECK: Supple. No thyromegaly. No nodules. No JVD.  PULMONARY: no wheezing no rhonchi  CARDIOVASCULAR: S1 and S2. Regular rate and rhythm. No murmurs, rubs, or gallops. No edema. Pedal pulses 2+ bilaterally.  GASTROINTESTINAL: Soft, nontender, nondistended. No masses. Positive bowel sounds. No hepatosplenomegaly.  MUSCULOSKELETAL: No swelling, clubbing, or edema. Range of motion full in all extremities.  NEUROLOGIC: Cranial nerves II through XII are intact. No gross focal neurological deficits. Sensation intact. Reflexes intact.  SKIN: No ulceration, lesions, rashes, or cyanosis. Skin warm and dry. Turgor intact.  PSYCHIATRIC: Mood, affect within normal limits. The patient is awake, alert and oriented x 3. Insight, judgment intact.       IMAGING    DG Chest Port 1 View  Result Date: 11/16/2020 CLINICAL DATA:  Respiratory distress, abnormal chest x-ray, history asthma, COPD, diabetes mellitus, hypertension EXAM: PORTABLE CHEST 1 VIEW COMPARISON:  Portable exam 0656 hours compared to 11/13/2020 FINDINGS: Normal heart size, mediastinal contours, and pulmonary vascularity. Atherosclerotic calcification aorta. Emphysematous and bronchitic changes consistent with COPD. Scattered chronic interstitial lung changes with superimposed acute infiltrate again identified in the RIGHT upper lobe, less at RIGHT base. No pleural effusion or pneumothorax. Bones demineralized. IMPRESSION: Changes of COPD and chronic interstitial lung disease with persistent superimposed RIGHT lung infiltrates. Electronically Signed   By: Lavonia Dana M.D.   On: 11/16/2020 08:34   Portable chest 1 View  Result  Date: 11/13/2020 CLINICAL DATA:  Pneumonia. EXAM: PORTABLE CHEST 1 VIEW COMPARISON:  Chest x-ray 11/12/2020 FINDINGS: The cardiac silhouette, mediastinal and hilar contours are within normal limits and stable. Stable underlying emphysematous changes. Persistent interstitial and airspace process in the lungs. No pleural  effusions. IMPRESSION: Persistent interstitial and airspace process in the lungs. Electronically Signed   By: Marijo Sanes M.D.   On: 11/13/2020 07:38   DG Chest Port 1 View  Result Date: 11/12/2020 CLINICAL DATA:  Respiratory distress, COPD, sepsis EXAM: PORTABLE CHEST 1 VIEW COMPARISON:  03/25/2020 FINDINGS: 2 frontal views of the chest demonstrate a stable cardiac silhouette. There is extensive background scarring and fibrosis compatible with emphysema. Multifocal areas of airspace disease are identified consistent with superimposed pneumonia. This is most pronounced within the suprahilar regions. The left upper lobe nodule previously evaluated by PET scan again noted over the left anterior fifth rib. No effusion or pneumothorax. No acute bony abnormalities. IMPRESSION: 1. Multifocal bilateral airspace disease concerning for edema or infection superimposed upon background emphysema. 2. Persistent left upper lobe nodule previously evaluated by CT and PET scan. Electronically Signed   By: Randa Ngo M.D.   On: 11/12/2020 21:42                                 Significant improvement , red arrows = areas of improvement previously inflitrated   ASSESSMENT/PLAN    Acute on chronic hypoxemic respiratory failure    -Due to community-acquired pneumonia with acute exacerbation of COPD secondary to PNA    -Treat underlying cause with typical COPD care path   Sepsis secondary to community-acquired pneumonia -Present on admission secondary to pneumonia, lactate was elevated  -Interval development of right lower lobe infiltrate as well as peripheral base interstitial opacification   -Leukocytosis is elevated out of proportion to clinical exam and radiographic findings even with steroids  -MRSA PCR is positive c/w vanco  -Patient weaned from 5 L/min to 4 L/min nasal cannula at this time  -Additional studies including Fungitell, Legionella, histoplasma urinary antigen, strep pneumo urinary antigen, Aspergillus serology, sputum respiratory cultures and AFB- thus far what is resulted all is negative except MRSA pcr Solu-Medrol has been dcd -12/10- reviwed cxr with family at bedside and patient.     -Severe acute exacerbation of COPD  -Continue chest physiotherapy and weaning of oxygen-currently at 5 L/min nasal cannula  -Occupational physical therapy to mobilize patient when able  -Solu-Medrol 40 twice daily>>>dcd -DuoNeb every 6 hours while awake -will need to initiate Roflumilast on outpatient basis due to chronic bronchitic phenotype of COPD     Thank you for allowing me to participate in the care of this patient.    Patient/Family are satisfied with care plan and all questions have been answered.   This document was prepared using Dragon voice recognition software and may include unintentional dictation errors.     Ottie Glazier, M.D.  Division of [place]

## 2020-11-17 NOTE — Consult Note (Signed)
Pharmacy Antibiotic Note  Christian Fields is a 64 y.o. male with medical history including emphysema on home oxygen, NSCLC s/p XRT admitted on 11/12/2020 with pneumonia. Labs on admission notable for marked leukocytosis with WBC 52.6 with associated left shift on differential, PCT 5.68. Vitals notable for low grade fever and ST. Patient was started on empiric antibiotic therapy with ceftriaxone + azithromycin.  Pharmacy has been consulted for vancomycin dosing.  Plan: Vancomycin 1.75 g IV LD x 1 followed by maintenance regimen of 750 mg IV q12h per nomogram. Per discussion on rounds, medical team plans for 5 days of abx therapy. Order level is therapy is continued past 12/10.   --Goal trough 15-20 mcg/mL for pneumonia  Also on ceftriaxone 2 g daily   Height: 5\' 8"  (172.7 cm) Weight: 80.2 kg (176 lb 12.9 oz) IBW/kg (Calculated) : 68.4  Temp (24hrs), Avg:98.2 F (36.8 C), Min:97.8 F (36.6 C), Max:99 F (37.2 C)  Recent Labs  Lab 11/12/20 2108 11/12/20 2314 11/13/20 0533 11/13/20 1729 11/13/20 2059 11/14/20 0417 11/15/20 0536 11/16/20 0534 11/17/20 0508  WBC 52.6*  --  51.2*  --   --   --  20.4* 12.0*  --   CREATININE 1.08  --  1.20  --   --  1.27*  --  1.11 1.06  LATICACIDVEN 1.7 2.4*  --  1.5 1.6  --   --   --   --     Estimated Creatinine Clearance: 67.2 mL/min (by C-G formula based on SCr of 1.06 mg/dL).    Allergies  Allergen Reactions  . Bee Venom Anaphylaxis  . Hydrocodone-Acetaminophen Itching    Antimicrobials this admission: Ceftriaxone 12/5 >> 12/6 Azithromycin 12/5 >>  Vancomycin 12/6 >>   Dose adjustments this admission: N/A  Microbiology results: 12/5 BCx: NGTD 12/6 Sputum: negative 12/6 MRSA PCR: (+)  Thank you for allowing pharmacy to be a part of this patient's care.  Oswald Hillock 11/17/2020 8:31 AM

## 2020-11-17 NOTE — Evaluation (Signed)
Physical Therapy Evaluation Patient Details Name: Christian Fields MRN: [id number] DOB: 12/31/1955 Today's Date: 11/17/2020   History of Present Illness  [name] is a 64 y.o. 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 He was admitted in April '21 for CAP. Due to his progressive SOB he called EMS. In route he was given Nebulizer treatment and IV steroids for his COPD symptoms.  Clinical Impression  Patient awake, watching television in bed upon arrival to room.  Alert and oriented; agreeable to participation with session.  Bilat UE/LE strength and ROM grossly symmetrical and WFL; no focal weakness appreciated.  Able to complete bed mobility with mod indep; sit/stand, basic transfers and gait (200') without assist device, cga/close sup.  Demonstrates forward flexed posture, broad BOS (mild genu varum noted); decreased cadence/gait speed; mild SOB, but limited insight into signs/symptoms of fatigue.  Desat 80-82% on 4L with gait, approx 2 min seated rest and pursed lip breathing for recovery >90%.  Second trial completed on 6L with desat to 86%, but noted improvement with higher O2 flow. Would benefit from skilled PT to address above deficits and promote optimal return to PLOF.; Recommend transition to HHPT upon discharge from acute hospitalization.      Follow Up Recommendations Home health PT    Equipment Recommendations       Recommendations for Other Services       Precautions / Restrictions Precautions Precautions: Fall Restrictions Weight Bearing Restrictions: No      Mobility  Bed Mobility Overal bed mobility: Modified Independent                  Transfers Overall transfer level: Needs assistance Equipment used: None Transfers: Sit to/from Stand Sit to Stand: Supervision;Min guard         General transfer  comment: fair/good LE strength and power; min use of UEs for initial stabilization  Ambulation/Gait Ambulation/Gait assistance: Min guard;Supervision Gait Distance (Feet): 200 Feet Assistive device: None   Gait velocity: 10' walk time, 12 seconds   General Gait Details: forward flexed posture, broad BOS (mild genu varum noted); decreased cadence/gait speed; mild SOB, but limited insight into signs/symptoms of fatigue.  Desat 80-82% on 4L with gait, approx 2 min seated rest and pursed lip breathing for recovery >90%  Stairs            Wheelchair Mobility    Modified Rankin (Stroke Patients Only)       Balance Overall balance assessment: Needs assistance Sitting-balance support: No upper extremity supported;Feet supported       Standing balance support: No upper extremity supported Standing balance-Leahy Scale: Fair                               Pertinent Vitals/Pain Pain Assessment: No/denies pain    Home Living Family/patient expects to be discharged to:: Private residence   Available Help at Discharge: Family;Available PRN/intermittently Type of Home: Mobile home Home Access: Stairs to enter Entrance Stairs-Rails: Right Entrance Stairs-Number of Steps: 5-6 Home Layout: One level Home Equipment: Walker - 2 wheels;Shower seat Additional Comments: Pt reports he only uses RW when he is having a "bad day"    Prior Function Level of Independence: Independent with assistive device(s)         Comments: Mod indep with  ADLs, housheold mobilization; intermittent use of RW as needed.  Denies fall history.  Home O2 2L "most of the time"     Hand Dominance        Extremity/Trunk Assessment   Upper Extremity Assessment Upper Extremity Assessment: Overall WFL for tasks assessed    Lower Extremity Assessment Lower Extremity Assessment: Overall WFL for tasks assessed       Communication   Communication: No difficulties  Cognition  Arousal/Alertness: Awake/alert Behavior During Therapy: WFL for tasks assessed/performed Overall Cognitive Status: Within Functional Limits for tasks assessed                                        General Comments      Exercises Other Exercises Other Exercises: 200' without assist device, cga/close sup (second trial) for additional O2 assessment.  Encouraged shorter trial due to duration of actvity, but patient insistent that "I'm going to show them I can do it".  O2 increased to 6L to assess tolerance; continues to desat, but to 86% with recovery >90% within 2 minutes   Assessment/Plan    PT Assessment Patient needs continued PT services  PT Problem List Decreased activity tolerance;Decreased balance;Decreased mobility;Decreased knowledge of use of DME;Decreased safety awareness;Decreased knowledge of precautions;Cardiopulmonary status limiting activity       PT Treatment Interventions DME instruction;Gait training;Stair training;Functional mobility training;Therapeutic activities;Balance training;Therapeutic exercise    PT Goals (Current goals can be found in the Care Plan section)  Acute Rehab PT Goals Patient Stated Goal: to go home PT Goal Formulation: With patient Time For Goal Achievement: 12/01/20 Potential to Achieve Goals: Good    Frequency Min 2X/week   Barriers to discharge Decreased caregiver support      Co-evaluation               AM-PAC PT "6 Clicks" Mobility  Outcome Measure Help needed turning from your back to your side while in a flat bed without using bedrails?: None Help needed moving from lying on your back to sitting on the side of a flat bed without using bedrails?: None Help needed moving to and from a bed to a chair (including a wheelchair)?: A Little Help needed standing up from a chair using your arms (e.g., wheelchair or bedside chair)?: None Help needed to walk in hospital room?: A Little Help needed climbing 3-5 steps  with a railing? : A Little 6 Click Score: 21    End of Session Equipment Utilized During Treatment: Gait belt Activity Tolerance: Patient tolerated treatment well Patient left: in chair;with chair alarm set Nurse Communication: Mobility status PT Visit Diagnosis: Muscle weakness (generalized) (M62.81);Difficulty in walking, not elsewhere classified (R26.2)    Time: 1610-9604 PT Time Calculation (min) (ACUTE ONLY): 22 min   Charges:   PT Evaluation $PT Eval Moderate Complexity: 1 Mod PT Treatments $Gait Training: 8-22 mins        Wanza Szumski H. Owens Shark, PT, DPT, NCS 11/17/20, 11:16 PM [phone]

## 2020-11-17 NOTE — Care Management Important Message (Signed)
Important Message  Patient Details  Name: Christian Fields MRN: [id number] Date of Birth: August 21, 1956   Medicare Important Message Given:  Yes     Dannette Barbara 11/17/2020, 1:46 PM

## 2020-11-17 NOTE — Progress Notes (Signed)
SATURATION QUALIFICATIONS: (This note is used to comply with regulatory documentation for home oxygen)  Patient Saturations on 4 Liters at Rest = 92%  Patient Saturations on 4 Liters while Ambulating = 83-85%  Patient Saturations on 6 Liters of oxygen while Ambulating = 89-90%  Patient recovered quickly with rest on 4 Liters of oxygen Saturation up to 93%.  Patient used 2 Liters of oxygen at all times prior to this admission

## 2020-11-18 LAB — GLUCOSE, CAPILLARY
Glucose-Capillary: 89 mg/dL (ref 70–99)
Glucose-Capillary: 137 mg/dL — ABNORMAL HIGH (ref 70–99)

## 2020-11-18 MED ORDER — HYDROCHLOROTHIAZIDE 12.5 MG PO TABS: 12.5000 mg | ORAL_TABLET | Freq: Every day | ORAL | Status: DC

## 2020-11-18 NOTE — Discharge Summary (Signed)
Christian Fields AGT:[id number] DOB: 06-05-1956 DOA: 11/12/2020  PCP: Remi Haggard, FNP  Admit date: 11/12/2020 Discharge date: 11/18/2020  Admitted From: Home Disposition: Home  Recommendations for Outpatient Follow-up:  1. Follow up with PCP in 1 week 2. Please obtain BMP/CBC in one week 3. Follow-up with Dr. Raul Del in 1 week  Home Health: PT   Discharge Condition:Stable CODE STATUS: Full Diet recommendation: Carb modified    Brief/Interim Summary: Per HPI: Christian Fields is a 64 y.o. 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 He was admitted in April '21 for CAP. Due to his progressive SOB he called EMS. In route he was given Nebulizer treatment and IV steroids for his COPD symptoms.He was found with T 100.4 tachycardia.   Patient was in respiratory distress on presentation and was put to BiPAP with a good response. He was started on antibiotics for pneumonia. His oxygen was slowly weaned down to 3L at rest but requires up to 6L with ambulation. He was admitted to hospitalist service with sepsis and acute exacerbation of COPD.  Pulmonology was consulted.  Monitor felt patient was stable for discharge today.  Severe sepsis present on admission -- patient came in with fever shortness of breath and chest x-ray revealed multifocal airspace disease consistent with pneumonia -- fever of 100.4, tachycardia, tachypnea, lactic acid 2.2 --MRSA + Sepsis resolved. Continued on IV vancomycin and Rocephin, per pulmonary discharge with doxycycline 100 mg twice daily x3 more days to complete antibiotics course Follow-up with PCP      Acute exacerbation of COPD chronic/end-stage emphysema/chronic respiratory failure/chronic oxygen Pulmonary was following  He was initially on IV steroids  Had PT who recommends home health  Was  on IV antibiotics with transition to p.o. 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 Danelle Earthly  Primary oncologist is Dr. Janese Banks       Chronic narcotic dependence -- patient on Suboxone-- follows withDr. Jeanice Lim     Type II diabetes, stable sugars -- last A1c 5.4 in April 2021 Blood glucose stable  Hypertension continue beta-blockers and hydrochlorothiazide Decrease hydrochlorothiazide to 12.5 mg since BP on the lower side  Hyperlipidemia continue statins  H/o Gout  continue allopurinol    Discharge Diagnoses:  Active Problems:   Hypertension   Diabetes mellitus ([place])   Hyperlipidemia   Tobacco use disorder   CAP (community acquired pneumonia)   Chronic obstructive pulmonary disease, unspecified ([place])   Sleep apnea   NSCLC of left lung ([place])   Severe sepsis [facility])    Discharge Instructions  Discharge Instructions    Call MD for:  difficulty breathing, headache or visual disturbances   Complete by: As directed    Call MD for:  temperature >100.4   Complete by: As directed    Diet - low sodium heart healthy   Complete by: As directed  Discharge instructions   Complete by: As directed    Take 1/2 pill of your 25mg  of hydrochlorothiazide.  Check blood pressure at home Follow up with pulmonology Dr. Raul Del in one week F/u with pcp in one week   Face-to-face encounter (required for Medicare/Medicaid patients)   Complete by: As directed    I Nolberto Hanlon certify that this patient  is under my care and that I, or a nurse practitioner or physician's assistant working with me, had a face-to-face encounter that meets the physician face-to-face encounter requirements with this patient on 11/18/2020. The encounter with the patient was in whole, or in part for the following medical condition(s) which is the primary reason for home health care (List medical conditDOE, weakness   The encounter with the patient was in whole, or in part, for the following medical condition, which is the primary reason for home health care: doe   I certify that, based on my findings, the following services are medically necessary home health services: Physical therapy   Reason for Medically Necessary Home Health Services: Therapy- Therapeutic Exercises to Increase Strength and Endurance   My clinical findings support the need for the above services: Shortness of breath with activity   Further, I certify that my clinical findings support that this patient is homebound due to: Unable to leave home safely without assistance   Home Health   Complete by: As directed    To provide the following care/treatments: PT   Increase activity slowly   Complete by: As directed      Allergies as of 11/18/2020      Reactions   Bee Venom Anaphylaxis   Hydrocodone-acetaminophen Itching      Medication List    TAKE these medications   albuterol 108 (90 Base) MCG/ACT inhaler Commonly known as: VENTOLIN HFA Inhale 2 puffs into the lungs every 6 (six) hours as needed for wheezing or shortness of breath.   alendronate 70 MG tablet Commonly known as: FOSAMAX Take 70 mg by mouth once a week.   allopurinol 300 MG tablet Commonly known as: ZYLOPRIM Take 600 mg by mouth 2 (two) times daily.   aspirin EC 81 MG tablet Take 1 tablet by mouth daily.   buprenorphine-naloxone 8-2 mg Subl SL tablet Commonly known as: SUBOXONE Place 1 tablet under the tongue 3 (three) times daily.   Zubsolv 8.6-2.1 MG Subl Generic  drug: Buprenorphine HCl-Naloxone HCl Place 1 tablet under the tongue in the morning, at noon, and at bedtime.   cholecalciferol 1000 units tablet Commonly known as: VITAMIN D Take 1,000 Units by mouth daily.   clopidogrel 75 MG tablet Commonly known as: PLAVIX Take 75 mg by mouth daily.   DULoxetine 30 MG capsule Commonly known as: CYMBALTA Take 1 capsule by mouth daily.   ezetimibe 10 MG tablet Commonly known as: ZETIA Take 10 mg by mouth daily.   Farxiga 10 MG Tabs tablet Generic drug: dapagliflozin propanediol Take 10 mg by mouth daily.   ferrous sulfate 325 (65 FE) MG tablet Take 325 mg by mouth daily with breakfast.   fluticasone 50 MCG/ACT nasal spray Commonly known as: FLONASE Place 2 sprays into both nostrils daily.   gabapentin 800 MG tablet Commonly known as: NEURONTIN Take 800 mg by mouth 4 (four) times daily.   hydrochlorothiazide 12.5 MG tablet Commonly known as: HYDRODIURIL Take 1 tablet (12.5 mg total) by mouth daily. What changed:   medication strength  how much to take   ipratropium 0.02 %  nebulizer solution Commonly known as: ATROVENT Inhale 2.5 mL (500 mcg total) by nebulization every 6 (six) hours.   Kombiglyze XR 2.04-999 MG Tb24 Generic drug: Saxagliptin-Metformin Take 1 tablet by mouth 2 (two) times daily.   metoprolol succinate 50 MG 24 hr tablet Commonly known as: TOPROL-XL Take 50 mg by mouth daily.   ondansetron 4 MG tablet Commonly known as: ZOFRAN Take 4 mg by mouth every 8 (eight) hours as needed.   pantoprazole 40 MG tablet Commonly known as: PROTONIX Take 40 mg by mouth daily.   simvastatin 40 MG tablet Commonly known as: ZOCOR Take 1 tablet by mouth daily at 6 PM.   Trelegy Ellipta 100-62.5-25 MCG/INH Aepb Generic drug: Fluticasone-Umeclidin-Vilant Inhale 1 puff into the lungs daily.       Follow-up Information    Erby Pian, MD Follow up in 1 week(s).   Specialty: Specialist Contact information: [place] [address] [phone]        Remi Haggard, FNP Follow up in 1 week(s).   Specialty: Family Medicine Contact information: [address] [place] [address] [phone]              Allergies  Allergen Reactions  . Bee Venom Anaphylaxis  . Hydrocodone-Acetaminophen Itching    Consultations:  Pulmonary   Procedures/Studies: DG Chest Port 1 View  Result Date: 11/16/2020 CLINICAL DATA:  Respiratory distress, abnormal chest x-ray, history asthma, COPD, diabetes mellitus, hypertension EXAM: PORTABLE CHEST 1 VIEW COMPARISON:  Portable exam 0656 hours compared to 11/13/2020 FINDINGS: Normal heart size, mediastinal contours, and pulmonary vascularity. Atherosclerotic calcification aorta. Emphysematous and bronchitic changes consistent with COPD. Scattered chronic interstitial lung changes with superimposed acute infiltrate again identified in the RIGHT upper lobe, less at RIGHT base. No pleural effusion or pneumothorax. Bones demineralized. IMPRESSION: Changes of COPD and chronic interstitial lung disease with persistent superimposed RIGHT lung infiltrates. Electronically Signed   By: Lavonia Dana M.D.   On: 11/16/2020 08:34   Portable chest 1 View  Result Date: 11/13/2020 CLINICAL DATA:  Pneumonia. EXAM: PORTABLE CHEST 1 VIEW COMPARISON:  Chest x-ray 11/12/2020 FINDINGS: The cardiac silhouette, mediastinal and hilar contours are within normal limits and stable. Stable underlying emphysematous changes. Persistent interstitial and airspace process in the lungs. No pleural effusions. IMPRESSION: Persistent interstitial and airspace process in the lungs. Electronically Signed   By: Marijo Sanes M.D.   On: 11/13/2020 07:38   DG Chest Port 1 View  Result Date: 11/12/2020 CLINICAL DATA:  Respiratory distress, COPD, sepsis EXAM: PORTABLE CHEST 1 VIEW COMPARISON:  03/25/2020 FINDINGS: 2 frontal views of the chest demonstrate a stable cardiac  silhouette. There is extensive background scarring and fibrosis compatible with emphysema. Multifocal areas of airspace disease are identified consistent with superimposed pneumonia. This is most pronounced within the suprahilar regions. The left upper lobe nodule previously evaluated by PET scan again noted over the left anterior fifth rib. No effusion or pneumothorax. No acute bony abnormalities. IMPRESSION: 1. Multifocal bilateral airspace disease concerning for edema or infection superimposed upon background emphysema. 2. Persistent left upper lobe nodule previously evaluated by CT and PET scan. Electronically Signed   By: Randa Ngo M.D.   On: 11/12/2020 21:42       Subjective: Pt feels much better, at baseline sob. No other issues  Discharge Exam: Vitals:   11/18/20 0430 11/18/20 0749  BP: 103/64 98/63  Pulse: 72 66  Resp: 16 18  Temp: 98.3 F (36.8 C) 98.6 F (  37 C)  SpO2: 93% 92%   Vitals:   11/17/20 2015 11/17/20 2100 11/18/20 0430 11/18/20 0749  BP: 110/81  103/64 98/63  Pulse: 82  72 66  Resp: 18  16 18   Temp: 98.6 F (37 C)  98.3 F (36.8 C) 98.6 F (37 C)  TempSrc: Oral  Oral   SpO2: 92% (!) 89% 93% 92%  Weight:   79.8 kg   Height:        General: Pt is alert, awake, not in acute distress Cardiovascular: RRR, S1/S2 +, no rubs, no gallops Respiratory: CTA bilaterally, no wheezing, no rhonchi Abdominal: Soft, NT, ND, bowel sounds + Extremities: no edema, no cyanosis    The results of significant diagnostics from this hospitalization (including imaging, microbiology, ancillary and laboratory) are listed below for reference.     Microbiology: Recent Results (from the past 240 hour(s))  Blood Culture (routine x 2)     Status: None   Collection Time: 11/12/20  9:07 PM   Specimen: BLOOD  Result Value Ref Range Status   Specimen Description BLOOD RIGHT ANTECUBITAL  Final   Special Requests   Final    BOTTLES DRAWN AEROBIC AND ANAEROBIC Blood Culture  adequate volume   Culture   Final    NO GROWTH 5 DAYS Performed at [facility], [address] [place], [place] [address]    Report Status 11/17/2020 FINAL  Final  Resp Panel by RT-PCR (Flu A&B, Covid) Nasopharyngeal Swab     Status: None   Collection Time: 11/12/20  9:08 PM   Specimen: Nasopharyngeal Swab; Nasopharyngeal(NP) swabs in vial transport medium  Result Value Ref Range Status   SARS Coronavirus 2 by RT PCR NEGATIVE NEGATIVE Final    Comment: (NOTE) SARS-CoV-2 target nucleic acids are NOT DETECTED.  The SARS-CoV-2 RNA is generally detectable in upper respiratory specimens during the acute phase of infection. The lowest concentration of SARS-CoV-2 viral copies this assay can detect is 138 copies/mL. A negative result does not preclude SARS-Cov-2 infection and should not be used as the sole basis for treatment or other patient management decisions. A negative result may occur with  improper specimen collection/handling, submission of specimen other than nasopharyngeal swab, presence of viral mutation(s) within the areas targeted by this assay, and inadequate number of viral copies(<138 copies/mL). A negative result must be combined with clinical observations, patient history, and epidemiological information. The expected result is Negative.  Fact Sheet for Patients:  EntrepreneurPulse.com.au  Fact Sheet for Healthcare Providers:  IncredibleEmployment.be  This test is no t yet approved or cleared by the [place] FDA and  has been authorized for detection and/or diagnosis of SARS-CoV-2 by FDA under an Emergency Use Authorization (EUA). This EUA will remain  in effect (meaning this test can be used) for the duration of the COVID-19 declaration under Section 564(b)(1) of the Act, 21 U.S.C.section 360bbb-3(b)(1), unless the authorization is terminated  or revoked sooner.       Influenza A by PCR NEGATIVE NEGATIVE Final    Influenza B by PCR NEGATIVE NEGATIVE Final    Comment: (NOTE) The Xpert Xpress SARS-CoV-2/FLU/RSV plus assay is intended as an aid in the diagnosis of influenza from Nasopharyngeal swab specimens and should not be used as a sole basis for treatment. Nasal washings and aspirates are unacceptable for Xpert Xpress SARS-CoV-2/FLU/RSV testing.  Fact Sheet for Patients: EntrepreneurPulse.com.au  Fact Sheet for Healthcare Providers: IncredibleEmployment.be  This test is not yet approved or cleared by the [place]  FDA and has been authorized for detection and/or diagnosis of SARS-CoV-2 by FDA under an Emergency Use Authorization (EUA). This EUA will remain in effect (meaning this test can be used) for the duration of the COVID-19 declaration under Section 564(b)(1) of the Act, 21 U.S.C. section 360bbb-3(b)(1), unless the authorization is terminated or revoked.  Performed at [facility], [address] [place], [place] [address]   Blood Culture (routine x 2)     Status: None   Collection Time: 11/12/20  9:08 PM   Specimen: BLOOD  Result Value Ref Range Status   Specimen Description BLOOD BLOOD LEFT WRIST  Final   Special Requests   Final    BOTTLES DRAWN AEROBIC AND ANAEROBIC Blood Culture adequate volume   Culture   Final    NO GROWTH 5 DAYS Performed at [facility], [address] [place], [place] [address]    Report Status 11/17/2020 FINAL  Final  MRSA PCR Screening     Status: Abnormal   Collection Time: 11/13/20  1:05 AM   Specimen: Nasopharyngeal  Result Value Ref Range Status   MRSA by PCR POSITIVE (A) NEGATIVE Final    Comment:        The GeneXpert MRSA Assay (FDA approved for NASAL specimens only), is one component of a comprehensive MRSA colonization surveillance program. It is not intended to diagnose MRSA infection nor to guide or monitor treatment for MRSA infections. RESULT CALLED TO, READ BACK  BY AND VERIFIED WITH: TESS THOMAS ON 11/13/20 AT 0224 QSD Performed at [facility], [address] [place], [place] [address]      Labs: BNP (last 3 results) Recent Labs    11/12/20 2108  BNP 616.0*   Basic Metabolic Panel: Recent Labs  Lab 11/12/20 2108 11/13/20 0533 11/14/20 0417 11/16/20 0534 11/17/20 0508  NA 137 138  --  140  --   K 3.3* 4.2  --  4.0  --   CL 100 97*  --  98  --   CO2 26 27  --  31  --   GLUCOSE 191* 245*  --  90  --   BUN 11 15  --  29*  --   CREATININE 1.08 1.20 1.27* 1.11 1.06  CALCIUM 8.7* 9.0  --  9.5  --    Liver Function Tests: Recent Labs  Lab 11/12/20 2108  AST 15  ALT 12  ALKPHOS 108  BILITOT 0.5  PROT 7.3  ALBUMIN 2.9*   No results for input(s): LIPASE, AMYLASE in the last 168 hours. No results for input(s): AMMONIA in the last 168 hours. CBC: Recent Labs  Lab 11/12/20 2108 11/13/20 0533 11/15/20 0536 11/16/20 0534  WBC 52.6* 51.2* 20.4* 12.0*  NEUTROABS 48.2*  --   --   --   HGB 10.8* 10.1* 9.0* 10.8*  HCT 35.8* 33.3* 30.0* 35.6*  MCV 93.2 94.3 93.2 94.2  PLT 583* 480* 505* 577*   Cardiac Enzymes: No results for input(s): CKTOTAL, CKMB, CKMBINDEX, TROPONINI in the last 168 hours. BNP: Invalid input(s): POCBNP CBG: Recent Labs  Lab 11/17/20 1131 11/17/20 1616 11/17/20 1656 11/17/20 2016 11/18/20 0746  GLUCAP 134* 58* 160* 80 89   D-Dimer No results for input(s): DDIMER in the last 72 hours. Hgb A1c No results for input(s): HGBA1C in the last 72 hours. Lipid Profile No results for input(s): CHOL, HDL, LDLCALC, TRIG, CHOLHDL, LDLDIRECT in the last 72 hours. Thyroid function studies No results for input(s): TSH, T4TOTAL,  T3FREE, THYROIDAB in the last 72 hours.  Invalid input(s): FREET3 Anemia work up No results for input(s): VITAMINB12, FOLATE, FERRITIN, TIBC, IRON, RETICCTPCT in the last 72 hours. Urinalysis    Component Value Date/Time   COLORURINE YELLOW (A) 11/13/2020 0616    APPEARANCEUR HAZY (A) 11/13/2020 0616   APPEARANCEUR Clear 03/24/2013 1853   LABSPEC 1.011 11/13/2020 0616   LABSPEC 1.020 03/24/2013 1853   PHURINE 5.0 11/13/2020 0616   GLUCOSEU >=500 (A) 11/13/2020 0616   GLUCOSEU Negative 03/24/2013 1853   HGBUR NEGATIVE 11/13/2020 0616   BILIRUBINUR NEGATIVE 11/13/2020 0616   BILIRUBINUR Negative 03/24/2013 [place] NEGATIVE 11/13/2020 0616   PROTEINUR 30 (A) 11/13/2020 0616   NITRITE NEGATIVE 11/13/2020 0616   LEUKOCYTESUR NEGATIVE 11/13/2020 0616   LEUKOCYTESUR Negative 03/24/2013 1853   Sepsis Labs Invalid input(s): PROCALCITONIN,  WBC,  LACTICIDVEN Microbiology Recent Results (from the past 240 hour(s))  Blood Culture (routine x 2)     Status: None   Collection Time: 11/12/20  9:07 PM   Specimen: BLOOD  Result Value Ref Range Status   Specimen Description BLOOD RIGHT ANTECUBITAL  Final   Special Requests   Final    BOTTLES DRAWN AEROBIC AND ANAEROBIC Blood Culture adequate volume   Culture   Final    NO GROWTH 5 DAYS Performed at [facility], [address] [place], [place] [address]    Report Status 11/17/2020 FINAL  Final  Resp Panel by RT-PCR (Flu A&B, Covid) Nasopharyngeal Swab     Status: None   Collection Time: 11/12/20  9:08 PM   Specimen: Nasopharyngeal Swab; Nasopharyngeal(NP) swabs in vial transport medium  Result Value Ref Range Status   SARS Coronavirus 2 by RT PCR NEGATIVE NEGATIVE Final    Comment: (NOTE) SARS-CoV-2 target nucleic acids are NOT DETECTED.  The SARS-CoV-2 RNA is generally detectable in upper respiratory specimens during the acute phase of infection. The lowest concentration of SARS-CoV-2 viral copies this assay can detect is 138 copies/mL. A negative result does not preclude SARS-Cov-2 infection and should not be used as the sole basis for treatment or other patient management decisions. A negative result may occur with  improper specimen collection/handling, submission of  specimen other than nasopharyngeal swab, presence of viral mutation(s) within the areas targeted by this assay, and inadequate number of viral copies(<138 copies/mL). A negative result must be combined with clinical observations, patient history, and epidemiological information. The expected result is Negative.  Fact Sheet for Patients:  EntrepreneurPulse.com.au  Fact Sheet for Healthcare Providers:  IncredibleEmployment.be  This test is no t yet approved or cleared by the [place] FDA and  has been authorized for detection and/or diagnosis of SARS-CoV-2 by FDA under an Emergency Use Authorization (EUA). This EUA will remain  in effect (meaning this test can be used) for the duration of the COVID-19 declaration under Section 564(b)(1) of the Act, 21 U.S.C.section 360bbb-3(b)(1), unless the authorization is terminated  or revoked sooner.       Influenza A by PCR NEGATIVE NEGATIVE Final   Influenza B by PCR NEGATIVE NEGATIVE Final    Comment: (NOTE) The Xpert Xpress SARS-CoV-2/FLU/RSV plus assay is intended as an aid in the diagnosis of influenza from Nasopharyngeal swab specimens and should not be used as a sole basis for treatment. Nasal washings and aspirates are unacceptable for Xpert Xpress SARS-CoV-2/FLU/RSV testing.  Fact Sheet for Patients: EntrepreneurPulse.com.au  Fact Sheet for Healthcare Providers: IncredibleEmployment.be  This test is not yet approved or cleared  by the [place] and has been authorized for detection and/or diagnosis of SARS-CoV-2 by FDA under an Emergency Use Authorization (EUA). This EUA will remain in effect (meaning this test can be used) for the duration of the COVID-19 declaration under Section 564(b)(1) of the Act, 21 U.S.C. section 360bbb-3(b)(1), unless the authorization is terminated or revoked.  Performed at [facility], [address] [place], [place] [address]   Blood Culture (routine x 2)     Status: None   Collection Time: 11/12/20  9:08 PM   Specimen: BLOOD  Result Value Ref Range Status   Specimen Description BLOOD BLOOD LEFT WRIST  Final   Special Requests   Final    BOTTLES DRAWN AEROBIC AND ANAEROBIC Blood Culture adequate volume   Culture   Final    NO GROWTH 5 DAYS Performed at [facility], [address] [place], [place] [address]    Report Status 11/17/2020 FINAL  Final  MRSA PCR Screening     Status: Abnormal   Collection Time: 11/13/20  1:05 AM   Specimen: Nasopharyngeal  Result Value Ref Range Status   MRSA by PCR POSITIVE (A) NEGATIVE Final    Comment:        The GeneXpert MRSA Assay (FDA approved for NASAL specimens only), is one component of a comprehensive MRSA colonization surveillance program. It is not intended to diagnose MRSA infection nor to guide or monitor treatment for MRSA infections. RESULT CALLED TO, READ BACK BY AND VERIFIED WITH: TESS THOMAS ON 11/13/20 AT 0224 QSD Performed at [facility], [address] [place], [place] [address]      Time coordinating discharge: Over 30 minutes  SIGNED:   Nolberto Hanlon, MD  [facility] 11/18/2020, 10:44 AM Pager   If 7PM-7AM, please contact night-coverage [email or web address] Password [name]

## 2020-11-18 NOTE — Progress Notes (Signed)
Patient discharged to home. Tele and IV d/c'd. Patient verbalizes understanding of discharge instructions. Patient received shower chair prior to discharge.

## 2020-11-18 NOTE — Progress Notes (Signed)
Pulmonary Medicine          Date: 11/18/2020,   MRN# [id number] RORAN WEGNER 04/25/1956     AdmissionWeight: 70.8 kg                 CurrentWeight: 79.8 kg   Referring physician: Dr. 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 He shares worsening shortness of breath increased O2 requirement and phlegm production. He received steroids and nebulizer therapy and shares that he has improved. On admission he was found to be febrile tachypneic and tachycardic with wheezing bilaterally he was given a liter of fluid as well as typical community-acquired pneumonia therapy with Zithromax and Rocephin. During my evaluation patient states that he is improved slightly however remains on 5 L/min nasal cannula supplemental oxygen. He has elevated leukocytosis at 51,000.     11/14/20- patient reports feeling better.  We reviewed his hospital course and addition of vancomycin due to MRSA +.  He shares he had MRSA pneumonia in the past.  Microbiology, resp cultures and serology has not returned with results yet.   11/15/20-  Patient is improved significatnly , his wheezing is almost resolved.  Blodwork with trending down leukocytosis. From  51K >>21.  O2 weaned to 4L/min from 5.  11/16/20- patietn is resting in bed in no distress.  Lung ausculatation is significantly improved. WBC count trending down well.   11/17/20- patient is sitting up in bed in NAD.  Family at bedside.  We reviewed CXR and hospital course.  Patient is improving . Anticipate DC soon per [name].    11/18/20- patient  sitting up in bed not using O2 and speaks in full sentences.  He had transient hypoxemia during exertional walk, with repeat PT pending today.  Reviewed care plan with Dr Kurtis Bushman last night and we are in process of DC planning. Discussed with patient he is excited to go home but will stay if necessary due to recurrent hospitalizations for pneumonia with AECOPD.    PAST MEDICAL HISTORY   Past Medical History:  Diagnosis Date   Anemia    Anxiety    Arthritis    Asthma    Cancer ([place])    Basal Cell Skin Cancer   Chronic back pain    COPD (chronic obstructive pulmonary disease) (HCC)    Depression    Diabetes mellitus (HCC)    Dyspnea    GERD (gastroesophageal reflux disease)    Gout    Gout    Headache    History of blood clots    Left Leg--July 2018   History of kidney stones    Hyperlipidemia    Hyperlipidemia    Hypertension    Kidney stones    Neuropathy    On home oxygen therapy    2 L / M   Pneumonia 06/2017   Sleep apnea    Ulcer of foot ([place])    Right     SURGICAL HISTORY   Past Surgical History:  Procedure Laterality Date   APPENDECTOMY     DG FEET  2 VIEWS BILAT     LIPOMA EXCISION Right 08/15/2017   Procedure: EXCISION TUMOR(CYST) FOOT;  Surgeon: Albertine Patricia, DPM;  Location: [facility] ORS;  Service: Podiatry;  Laterality: Right;   OTHER SURGICAL HISTORY Bilateral Foot surgery     FAMILY HISTORY   Family History  Problem Relation Age of Onset   Hypertension Mother      SOCIAL HISTORY   Social History   Tobacco Use   Smoking status: Former Smoker    Packs/day: 0.50    Types: Cigarettes    Start date: 2018   Smokeless tobacco: Never Used  Scientific laboratory technician Use: Never used  Substance Use Topics   Alcohol use: Yes    Alcohol/week: 1.0 standard drink    Types: 1 Cans of beer per week    Comment: occ   Drug use: No     MEDICATIONS    Home Medication:    Current Medication:  Current  Facility-Administered Medications:    acetaminophen (TYLENOL) tablet 650 mg, 650 mg, Oral, Q6H PRN, Norins, Heinz Knuckles, MD, 650 mg at 11/15/20 2221   allopurinol (ZYLOPRIM) tablet 600 mg, 600 mg, Oral, BID, Norins, Heinz Knuckles, MD, 600 mg at 11/18/20 0865   aspirin EC tablet 81 mg, 81 mg, Oral, Daily, Norins, Heinz Knuckles, MD, 81 mg at 11/18/20 0931   buprenorphine-naloxone (SUBOXONE) 8-2 mg per SL tablet 1 tablet, 1 tablet, Sublingual, TID, 1 tablet at 11/18/20 0931 **AND** [DISCONTINUED] buprenorphine-naloxone (SUBOXONE) 2-0.5 mg per SL tablet 2 tablet, 2 tablet, Sublingual, TID, Fritzi Mandes, MD, 2 tablet at 11/13/20 2132   cefTRIAXone (ROCEPHIN) 2 g in sodium chloride 0.9 % 100 mL IVPB, 2 g, Intravenous, Q24H, Norins, Heinz Knuckles, MD, Stopped at 11/16/20 2133   cholecalciferol (VITAMIN D3) tablet 1,000 Units, 1,000 Units, Oral, Daily, Norins, Heinz Knuckles, MD, 1,000 Units at 11/18/20 0931   clopidogrel (PLAVIX) tablet 75 mg, 75 mg, Oral, Daily, Norins, Heinz Knuckles, MD, 75 mg at 11/18/20 0931   DULoxetine (CYMBALTA) DR capsule 30 mg, 30 mg, Oral, Daily, Norins, Heinz Knuckles, MD, 30 mg at 11/18/20 0931   enoxaparin (LOVENOX) injection 40 mg, 40 mg, Subcutaneous, Q24H, Norins, Heinz Knuckles, MD, 40 mg at 11/17/20 2122   ezetimibe (ZETIA) tablet 10 mg, 10 mg, Oral, Daily, Norins, Heinz Knuckles, MD, 10 mg at 11/18/20 0931   ferrous sulfate tablet 325 mg, 325 mg, Oral, Q breakfast, Norins, Heinz Knuckles, MD, 325 mg at 11/18/20 0930   fluticasone (FLONASE) 50 MCG/ACT nasal spray 2 spray, 2 spray, Each Nare, Daily, Norins, Heinz Knuckles, MD, 2 spray at 11/18/20 0934   fluticasone furoate-vilanterol (BREO ELLIPTA) 100-25 MCG/INH 1 puff, 1 puff, Inhalation, Daily, 1 puff at 11/18/20 0934 **AND** umeclidinium bromide (INCRUSE ELLIPTA) 62.5 MCG/INH 1 puff, 1 puff, Inhalation, Daily, Norins, Heinz Knuckles, MD, 1 puff at 11/18/20 0935   gabapentin (NEURONTIN) capsule 800 mg, 800 mg, Oral, QID, Norins, Heinz Knuckles, MD, 800 mg at 11/18/20  0931   hydrochlorothiazide (HYDRODIURIL) tablet 25 mg, 25 mg, Oral, Daily, Norins, Heinz Knuckles, MD, 25 mg at 11/18/20 0931   insulin aspart (novoLOG) injection 0-20 Units, 0-20 Units, Subcutaneous, TID WC, Norins, Heinz Knuckles, MD, 3 Units at 11/17/20 1710   ipratropium-albuterol (DUONEB) 0.5-2.5 (3) MG/3ML nebulizer solution 3 mL, 3 mL, Nebulization, BID, Fritzi Mandes, MD, 3 mL at 11/18/20 0816   linagliptin (TRADJENTA) tablet 5 mg, 5 mg, Oral, Daily, Norins, Heinz Knuckles, MD, 5 mg at 11/18/20 0931   metFORMIN (GLUCOPHAGE-XR) 24 hr tablet  1,000 mg, 1,000 mg, Oral, BID WC, Norins, Heinz Knuckles, MD, 1,000 mg at 11/17/20 1708   metoprolol succinate (TOPROL-XL) 24 hr tablet 50 mg, 50 mg, Oral, Daily, Norins, Heinz Knuckles, MD, 50 mg at 11/18/20 0931   pantoprazole (PROTONIX) EC tablet 40 mg, 40 mg, Oral, Daily, Norins, Heinz Knuckles, MD, 40 mg at 11/18/20 0931   simvastatin (ZOCOR) tablet 40 mg, 40 mg, Oral, q1800, Norins, Heinz Knuckles, MD, 40 mg at 11/17/20 1709   traZODone (DESYREL) tablet 25 mg, 25 mg, Oral, QHS PRN, Norins, Heinz Knuckles, MD, 25 mg at 11/15/20 2108    ALLERGIES   Bee venom and Hydrocodone-acetaminophen     REVIEW OF SYSTEMS    Review of Systems:  Gen:  Denies  fever, sweats, chills weigh loss  HEENT: Denies blurred vision, double vision, ear pain, eye pain, hearing loss, nose bleeds, sore throat Cardiac:  No dizziness, chest pain or heaviness, chest tightness,edema Resp:   D cough, phlegm production and wheezing bilateral Gi: Denies swallowing difficulty, stomach pain, nausea or vomiting, diarrhea, constipation, bowel incontinence Gu:  Denies bladder incontinence, burning urine Ext:   Denies Joint pain, stiffness or swelling Skin: Denies  skin rash, easy bruising or bleeding or hives Endoc:  Denies polyuria, polydipsia , polyphagia or weight change Psych:   Denies depression, insomnia or hallucinations   Other:  All other systems negative   VS: BP 98/63 (BP Location: Left Arm)     Pulse 66    Temp 98.6 F (37 C)    Resp 18    Ht 5\' 8"  (1.727 m)    Wt 79.8 kg    SpO2 92%    BMI 26.76 kg/m      PHYSICAL EXAM    GENERAL:NAD, no fevers, chills, no weakness no fatigue HEAD: Normocephalic, atraumatic.  EYES: Pupils equal, round, reactive to light. Extraocular muscles intact. No scleral icterus.  MOUTH: Moist mucosal membrane. Dentition intact. No abscess noted.  EAR, NOSE, THROAT: Clear without exudates. No external lesions.  NECK: Supple. No thyromegaly. No nodules. No JVD.  PULMONARY: no wheezing no rhonchi  CARDIOVASCULAR: S1 and S2. Regular rate and rhythm. No murmurs, rubs, or gallops. No edema. Pedal pulses 2+ bilaterally.  GASTROINTESTINAL: Soft, nontender, nondistended. No masses. Positive bowel sounds. No hepatosplenomegaly.  MUSCULOSKELETAL: No swelling, clubbing, or edema. Range of motion full in all extremities.  NEUROLOGIC: Cranial nerves II through XII are intact. No gross focal neurological deficits. Sensation intact. Reflexes intact.  SKIN: No ulceration, lesions, rashes, or cyanosis. Skin warm and dry. Turgor intact.  PSYCHIATRIC: Mood, affect within normal limits. The patient is awake, alert and oriented x 3. Insight, judgment intact.       IMAGING    DG Chest Port 1 View  Result Date: 11/16/2020 CLINICAL DATA:  Respiratory distress, abnormal chest x-ray, history asthma, COPD, diabetes mellitus, hypertension EXAM: PORTABLE CHEST 1 VIEW COMPARISON:  Portable exam 0656 hours compared to 11/13/2020 FINDINGS: Normal heart size, mediastinal contours, and pulmonary vascularity. Atherosclerotic calcification aorta. Emphysematous and bronchitic changes consistent with COPD. Scattered chronic interstitial lung changes with superimposed acute infiltrate again identified in the RIGHT upper lobe, less at RIGHT base. No pleural effusion or pneumothorax. Bones demineralized. IMPRESSION: Changes of COPD and chronic interstitial lung disease with persistent  superimposed RIGHT lung infiltrates. Electronically Signed   By: Lavonia Dana M.D.   On: 11/16/2020 08:34   Portable chest 1 View  Result Date: 11/13/2020 CLINICAL DATA:  Pneumonia. EXAM: PORTABLE CHEST 1 VIEW COMPARISON:  Chest x-ray 11/12/2020 FINDINGS: The cardiac silhouette, mediastinal and hilar contours are within normal limits and stable. Stable underlying emphysematous changes. Persistent interstitial and airspace process in the lungs. No pleural effusions. IMPRESSION: Persistent interstitial and airspace process in the lungs. Electronically Signed   By: Marijo Sanes M.D.   On: 11/13/2020 07:38   DG Chest Port 1 View  Result Date: 11/12/2020 CLINICAL DATA:  Respiratory distress, COPD, sepsis EXAM: PORTABLE CHEST 1 VIEW COMPARISON:  03/25/2020 FINDINGS: 2 frontal views of the chest demonstrate a stable cardiac silhouette. There is extensive background scarring and fibrosis compatible with emphysema. Multifocal areas of airspace disease are identified consistent with superimposed pneumonia. This is most pronounced within the suprahilar regions. The left upper lobe nodule previously evaluated by PET scan again noted over the left anterior fifth rib. No effusion or pneumothorax. No acute bony abnormalities. IMPRESSION: 1. Multifocal bilateral airspace disease concerning for edema or infection superimposed upon background emphysema. 2. Persistent left upper lobe nodule previously evaluated by CT and PET scan. Electronically Signed   By: Randa Ngo M.D.   On: 11/12/2020 21:42                                 Significant improvement , red arrows = areas of improvement previously inflitrated   ASSESSMENT/PLAN    Acute on chronic hypoxemic respiratory failure    -Due to community-acquired pneumonia with acute exacerbation of COPD secondary to PNA    -Treat underlying cause with typical COPD care path   Sepsis secondary to community-acquired pneumonia -Present on admission secondary to  pneumonia, lactate was elevated  -Interval development of right lower lobe infiltrate as well as peripheral base interstitial opacification  -Leukocytosis is improved  -MRSA PCR is positive c/w vanco  -Patient weaned from 5 L/min to 4 L/min nasal cannula at this time>>>11/18/20 he has been weaned to 2.5L nasal canula  -Additional studies including Fungitell, Legionella, histoplasma urinary antigen, strep pneumo urinary antigen, Aspergillus serology, sputum respiratory cultures and AFB- thus far what is resulted all is negative except MRSA pcr Solu-Medrol has been dcd -12/10- reviwed cxr with family at bedside and patient.  11/18/20 - DC planning with PT and exertional walk today.  Currently very close to baseline s/p tx multifocal pneumonia with AECOPD.     -Severe acute exacerbation of COPD  -Continue chest physiotherapy and weaning of oxygen-currently at 5 L/min nasal cannula  -Occupational physical therapy to mobilize patient when able  -Solu-Medrol 40 twice daily>>>dcd -DuoNeb every 6 hours while awake -will need to initiate Roflumilast on outpatient basis due to chronic bronchitic phenotype of COPD     Thank you for allowing me to participate in the care of this patient.    Patient/Family are satisfied with care plan and all questions have been answered.   This document was prepared using Dragon voice recognition software and may include unintentional dictation errors.     Ottie Glazier, M.D.  Division of [place]

## 2020-11-18 NOTE — Progress Notes (Signed)
Mobility Specialist - Progress Note   11/18/20 1202  Mobility  Activity Ambulated in room;Ambulated in hall;Ambulated to bathroom  Level of Assistance Independent (CGA for safety)  Assistive Device None  Distance Ambulated (ft) 200 ft  Mobility Response Tolerated well  Mobility performed by Mobility specialist  $Mobility charge 1 Mobility    Pre-mobility: 75 HR, BP, 89% SpO2 (3L O2 [place]) Post-mobility: 77 HR, 90% SpO2 (4L O2 [place])   Pt laying in bed upon arrival, resting on 2.5L O2 [place]. O2 sat 88% SpO2. Pt agreed to session. Pt independent t/o session. 3L O2 [place] applied prior to ambulating. Pt ambulated ~200' total in room and hallway. No LOB noted. No c/o pain. Noted SOB. Noted pt desat to 73% on 3L. O2 increased to 4L, O2 sat up to 77% during ambulation. Pt encouraged to utilize PLB. Once returned to recliner, O2 sat up to 90% after resting for a couple of minutes. At the end of ambulation, pt c/o "slight chest tightness". Pt left sitting on recliner w/ all needs placed in reach. Nurse was notified.    [place] Mobility Specialist  11/18/20, 12:09 PM

## 2020-11-20 ENCOUNTER — Ambulatory Visit
Admission: RE | Admit: 2020-11-20 | Discharge: 2020-11-20 | Disposition: A | Discharge status: Home / Self Care | Payer: Medicare HMO | Source: Ambulatory Visit | Attending: Oncology | Admitting: Oncology

## 2020-11-20 ENCOUNTER — Other Ambulatory Visit: Payer: Self-pay

## 2020-11-20 DIAGNOSIS — R911 Solitary pulmonary nodule: Secondary | ICD-10-CM | POA: Insufficient documentation

## 2020-11-21 ENCOUNTER — Inpatient Hospital Stay: Payer: Medicare HMO

## 2020-11-21 ENCOUNTER — Other Ambulatory Visit: Payer: Self-pay | Admitting: *Deleted

## 2020-11-21 ENCOUNTER — Inpatient Hospital Stay: Payer: Medicare HMO | Attending: Oncology | Admitting: Oncology

## 2020-11-21 VITALS — BP 117/78 | HR 90 | Temp 99.0°F | Resp 18 | Wt 170.7 lb

## 2020-11-21 DIAGNOSIS — F32A Depression, unspecified: Secondary | ICD-10-CM | POA: Diagnosis not present

## 2020-11-21 DIAGNOSIS — J440 Chronic obstructive pulmonary disease with acute lower respiratory infection: Secondary | ICD-10-CM | POA: Insufficient documentation

## 2020-11-21 DIAGNOSIS — I10 Essential (primary) hypertension: Secondary | ICD-10-CM | POA: Diagnosis not present

## 2020-11-21 DIAGNOSIS — Z86718 Personal history of other venous thrombosis and embolism: Secondary | ICD-10-CM | POA: Diagnosis not present

## 2020-11-21 DIAGNOSIS — Z87442 Personal history of urinary calculi: Secondary | ICD-10-CM | POA: Diagnosis not present

## 2020-11-21 DIAGNOSIS — C3412 Malignant neoplasm of upper lobe, left bronchus or lung: Secondary | ICD-10-CM | POA: Insufficient documentation

## 2020-11-21 DIAGNOSIS — E538 Deficiency of other specified B group vitamins: Secondary | ICD-10-CM | POA: Insufficient documentation

## 2020-11-21 DIAGNOSIS — R911 Solitary pulmonary nodule: Secondary | ICD-10-CM

## 2020-11-21 DIAGNOSIS — G4730 Sleep apnea, unspecified: Secondary | ICD-10-CM | POA: Insufficient documentation

## 2020-11-21 DIAGNOSIS — Z87891 Personal history of nicotine dependence: Secondary | ICD-10-CM | POA: Insufficient documentation

## 2020-11-21 DIAGNOSIS — Z85828 Personal history of other malignant neoplasm of skin: Secondary | ICD-10-CM | POA: Diagnosis not present

## 2020-11-21 DIAGNOSIS — Z7984 Long term (current) use of oral hypoglycemic drugs: Secondary | ICD-10-CM | POA: Diagnosis not present

## 2020-11-21 DIAGNOSIS — Z923 Personal history of irradiation: Secondary | ICD-10-CM | POA: Insufficient documentation

## 2020-11-21 DIAGNOSIS — F419 Anxiety disorder, unspecified: Secondary | ICD-10-CM | POA: Insufficient documentation

## 2020-11-21 DIAGNOSIS — D649 Anemia, unspecified: Secondary | ICD-10-CM

## 2020-11-21 DIAGNOSIS — Z79899 Other long term (current) drug therapy: Secondary | ICD-10-CM | POA: Insufficient documentation

## 2020-11-21 DIAGNOSIS — K219 Gastro-esophageal reflux disease without esophagitis: Secondary | ICD-10-CM | POA: Diagnosis not present

## 2020-11-21 DIAGNOSIS — D508 Other iron deficiency anemias: Secondary | ICD-10-CM

## 2020-11-21 DIAGNOSIS — Z7982 Long term (current) use of aspirin: Secondary | ICD-10-CM | POA: Diagnosis not present

## 2020-11-21 DIAGNOSIS — D509 Iron deficiency anemia, unspecified: Secondary | ICD-10-CM | POA: Diagnosis not present

## 2020-11-21 DIAGNOSIS — E785 Hyperlipidemia, unspecified: Secondary | ICD-10-CM | POA: Diagnosis not present

## 2020-11-21 DIAGNOSIS — E119 Type 2 diabetes mellitus without complications: Secondary | ICD-10-CM | POA: Insufficient documentation

## 2020-11-21 MED ORDER — CYANOCOBALAMIN 1000 MCG/ML IJ SOLN
1000.0000 ug | Freq: Once | INTRAMUSCULAR | Status: AC
  Administered 2020-11-21: 16:00:00 1000 ug via INTRAMUSCULAR
  Filled 2020-11-21: qty 1

## 2020-11-22 LAB — FUNGITELL, SERUM

## 2020-11-27 ENCOUNTER — Encounter: Payer: Self-pay | Admitting: Oncology

## 2020-11-27 DIAGNOSIS — D509 Iron deficiency anemia, unspecified: Secondary | ICD-10-CM | POA: Insufficient documentation

## 2020-11-27 NOTE — Progress Notes (Signed)
Hematology/Oncology Consult note [facility]  [phone] [phone]  Patient Care Team: Remi Haggard, FNP as PCP - General (Family Medicine) Telford Nab, RN as Oncology Nurse Navigator   Name of the patient: Christian Fields  [id number]  04-19-1956   Date of visit: 11/27/20  Diagnosis- stage I lung cancer s/p radiation Normocytic anemia  Chief complaint/ Reason for visit-routine follow-up of lung cancer  Heme/Onc history: Patient is a 64 year old male who was admitted to the hospital in April 2021 for community-acquired pneumonia and as a part of the work-up he had a CT angio chest done which incidentally showed a 1 cm irregular left upper lobe lung nodule repeat CT scan in August 2021 showed an interval increase in the size from 1 to 1.4 cm. No appreciable thoracic adenopathy. Severe centrilobular emphysema. Chronic pulmonary hypertension. Patient has been seen by Dr. Raul Del and has been considered to be high risk for biopsy from their standpoint.  He underwent SBRT to the left upper lobe lung lesion.  Patient now referred for anemia  Most recent labs from September 2021 showed a normal liver and kidney function.  TSH was normal.  Vitamin D levels were low.  CBC showed white count of 12.6, H&H of 10.9/37 with an MCV of 102 and a platelet count of 358.  Anemia work-up from 10/27/2020 showed a mildly low ferritin level of 28 with an iron saturation of 8% myeloma panel, serum free light chains, haptoglobin, TSH and LDH was normal.  B12 levels were low at 166.  Interval history-patient recently completed radiation treatment for his lung.  He was recently hospitalized for acute on chronic hypoxic respiratory failure likely secondary to pneumonia/COPD exacerbation.  Currently reports feeling better.  ECOG PS- 2 Pain scale- 0  Review of systems- Review of Systems  Constitutional: Positive for malaise/fatigue. Negative for chills, fever  and weight loss.  HENT: Negative for congestion, ear discharge and nosebleeds.   Eyes: Negative for blurred vision.  Respiratory: Negative for cough, hemoptysis, sputum production, shortness of breath and wheezing.   Cardiovascular: Negative for chest pain, palpitations, orthopnea and claudication.  Gastrointestinal: Negative for abdominal pain, blood in stool, constipation, diarrhea, heartburn, melena, nausea and vomiting.  Genitourinary: Negative for dysuria, flank pain, frequency, hematuria and urgency.  Musculoskeletal: Negative for back pain, joint pain and myalgias.  Skin: Negative for rash.  Neurological: Negative for dizziness, tingling, focal weakness, seizures, weakness and headaches.  Endo/Heme/Allergies: Does not bruise/bleed easily.  Psychiatric/Behavioral: Negative for depression and suicidal ideas. The patient does not have insomnia.       Allergies  Allergen Reactions  . Bee Venom Anaphylaxis  . Hydrocodone-Acetaminophen Itching     Past Medical History:  Diagnosis Date  . Anemia   . Anxiety   . Arthritis   . Asthma   . Cancer ([place])    Basal Cell Skin Cancer  . Chronic back pain   . COPD (chronic obstructive pulmonary disease) ([place])   . Depression   . Diabetes mellitus ([place])   . Dyspnea   . GERD (gastroesophageal reflux disease)   . Gout   . Gout   . Headache   . History of blood clots    Left Leg--July 2018  . History of kidney stones   . Hyperlipidemia   . Hyperlipidemia   . Hypertension   . Kidney stones   . Neuropathy   . On home oxygen therapy    2 L / M  .  Pneumonia 06/2017  . Sleep apnea   . Ulcer of foot ([place])    Right     Past Surgical History:  Procedure Laterality Date  . APPENDECTOMY    . DG FEET 2 VIEWS BILAT    . LIPOMA EXCISION Right 08/15/2017   Procedure: EXCISION TUMOR(CYST) FOOT;  Surgeon: Albertine Patricia, DPM;  Location: [facility] ORS;  Service: Podiatry;  Laterality: Right;  . OTHER SURGICAL HISTORY Bilateral Foot surgery     Social History   Socioeconomic History  . Marital status: Widowed    Spouse name: Not on file  . Number of children: Not on file  . Years of education: Not on file  . Highest education level: Not on file  Occupational History  . Not on file  Tobacco Use  . Smoking status: Former Smoker    Packs/day: 0.50    Types: Cigarettes    Start date: 2018  . Smokeless tobacco: Never Used  Vaping Use  . Vaping Use: Never used  Substance and Sexual Activity  . Alcohol use: Yes    Alcohol/week: 1.0 standard drink    Types: 1 Cans of beer per week    Comment: occ  . Drug use: No  . Sexual activity: Not on file  Other Topics Concern  . Not on file  Social History Narrative  . Not on file   Social Determinants of Health   Financial Resource Strain: Not on file  Food Insecurity: Not on file  Transportation Needs: Not on file  Physical Activity: Not on file  Stress: Not on file  Social Connections: Not on file  Intimate Partner Violence: Not on file    Family History  Problem Relation Age of Onset  . Hypertension Mother      Current Outpatient Medications:  .  albuterol (VENTOLIN HFA) 108 (90 Base) MCG/ACT inhaler, Inhale 2 puffs into the lungs every 6 (six) hours as needed for wheezing or shortness of breath., Disp: , Rfl:  .  alendronate (FOSAMAX) 70 MG tablet, Take 70 mg by mouth once a week. , Disp: , Rfl:  .  allopurinol (ZYLOPRIM) 300 MG tablet, Take 600 mg by mouth 2 (two) times daily., Disp: , Rfl:  .  aspirin EC 81 MG tablet, Take 1 tablet by mouth daily., Disp: , Rfl:  .  buprenorphine-naloxone (SUBOXONE) 8-2 mg SUBL SL tablet, Place 1 tablet under the tongue 3 (three) times daily.  (Patient not taking: Reported on 11/13/2020), Disp: , Rfl:  .  cholecalciferol (VITAMIN D) 1000 units tablet, Take 1,000 Units by mouth daily., Disp: , Rfl:  .  clopidogrel (PLAVIX) 75 MG tablet, Take 75 mg by mouth daily. , Disp: , Rfl:  .  DULoxetine (CYMBALTA) 30 MG capsule, Take 1  capsule by mouth daily., Disp: , Rfl:  .  ezetimibe (ZETIA) 10 MG tablet, Take 10 mg by mouth daily., Disp: , Rfl:  .  FARXIGA 10 MG TABS tablet, Take 10 mg by mouth daily., Disp: , Rfl:  .  ferrous sulfate 325 (65 FE) MG tablet, Take 325 mg by mouth daily with breakfast. (Patient not taking: Reported on 10/27/2020), Disp: , Rfl:  .  fluticasone (FLONASE) 50 MCG/ACT nasal spray, Place 2 sprays into both nostrils daily. (Patient not taking: Reported on 10/27/2020), Disp: , Rfl: 5 .  gabapentin (NEURONTIN) 800 MG tablet, Take 800 mg by mouth 4 (four) times daily., Disp: , Rfl: 0 .  hydrochlorothiazide (HYDRODIURIL) 12.5 MG tablet, Take 1 tablet (12.5 mg total)  by mouth daily., Disp: , Rfl:  .  ipratropium (ATROVENT) 0.02 % nebulizer solution, Inhale 2.5 mL (500 mcg total) by nebulization every 6 (six) hours., Disp: , Rfl:  .  KOMBIGLYZE XR 2.04-999 MG TB24, Take 1 tablet by mouth 2 (two) times daily., Disp: , Rfl:  .  metoprolol succinate (TOPROL-XL) 50 MG 24 hr tablet, Take 50 mg by mouth daily., Disp: , Rfl:  .  ondansetron (ZOFRAN) 4 MG tablet, Take 4 mg by mouth every 8 (eight) hours as needed., Disp: , Rfl:  .  pantoprazole (PROTONIX) 40 MG tablet, Take 40 mg by mouth daily., Disp: , Rfl: 1 .  simvastatin (ZOCOR) 40 MG tablet, Take 1 tablet by mouth daily at 6 PM., Disp: , Rfl:  .  TRELEGY ELLIPTA 100-62.5-25 MCG/INH AEPB, Inhale 1 puff into the lungs daily., Disp: , Rfl:  .  ZUBSOLV 8.6-2.1 MG SUBL, Place 1 tablet under the tongue in the morning, at noon, and at bedtime., Disp: , Rfl:   Physical exam:  Vitals:   11/21/20 1513  BP: 117/78  Pulse: 90  Resp: 18  Temp: 99 F (37.2 C)  TempSrc: Tympanic  SpO2: 90%  Weight: 170 lb 11.2 oz (77.4 kg)   Physical Exam Constitutional:      Comments: Appears older than stated age.  On home oxygen  Eyes:     Extraocular Movements: EOM normal.  Cardiovascular:     Rate and Rhythm: Normal rate and regular rhythm.     Heart sounds: Normal  heart sounds.  Pulmonary:     Effort: Pulmonary effort is normal.     Breath sounds: Normal breath sounds.     Comments: Breath sounds decreased at bases Abdominal:     General: Bowel sounds are normal.     Palpations: Abdomen is soft.  Skin:    General: Skin is warm and dry.  Neurological:     Mental Status: He is alert and oriented to person, place, and time.      CMP Latest Ref Rng & Units 11/17/2020  Glucose 70 - 99 mg/dL -  BUN 8 - 23 mg/dL -  Creatinine 0.61 - 1.24 mg/dL 1.06  Sodium 135 - 145 mmol/L -  Potassium 3.5 - 5.1 mmol/L -  Chloride 98 - 111 mmol/L -  CO2 22 - 32 mmol/L -  Calcium 8.9 - 10.3 mg/dL -  Total Protein 6.5 - 8.1 g/dL -  Total Bilirubin 0.3 - 1.2 mg/dL -  Alkaline Phos 38 - 126 U/L -  AST 15 - 41 U/L -  ALT 0 - 44 U/L -   CBC Latest Ref Rng & Units 11/16/2020  WBC 4.0 - 10.5 K/uL 12.0(H)  Hemoglobin 13.0 - 17.0 g/dL 10.8(L)  Hematocrit 39.0 - 52.0 % 35.6(L)  Platelets 150 - 400 K/uL 577(H)    No images are attached to the encounter.  CT Chest Wo Contrast  Result Date: 11/20/2020 CLINICAL DATA:  Follow-up pulmonary nodule. EXAM: CT CHEST WITHOUT CONTRAST TECHNIQUE: Multidetector CT imaging of the chest was performed following the standard protocol without IV contrast. COMPARISON:  PET-CT 08/24/2020 FINDINGS: Cardiovascular: The heart is normal in size. No pericardial effusion. Stable age advanced atherosclerotic calcifications involving the aorta and coronary arteries. Stable pulmonary arterial enlargement suggesting pulmonary hypertension. Mediastinum/Nodes: Numerous scattered mediastinal and hilar lymph nodes appear stable. A node in the precarinal area and measures 9 mm on image 62/2. 11.5 mm subcarinal lymph node on image 82/2 is stable. The esophagus is grossly.  Lungs/Pleura: Stable severe emphysematous changes and areas of pulmonary scarring. 10 x 9 mm left upper lobe nodule is actually slightly smaller. Previously measured 14 x 11 mm. There are  persistent and progressive patchy subpleural airspace opacities in the left lower lobe and similar new findings in the right lower lobe. These could be infiltrates or other inflammatory process. I would recommend a short-term follow-up chest CT to make sure these resolve. Few patchy areas of subpleural ground-glass opacity in the left lung are also likely inflammatory. No pleural effusions or pleural nodules. Upper Abdomen: No significant upper abdominal findings. Cholelithiasis is noted. Stable hyperdense/hemorrhagic cyst projecting off the midpole region right kidney posteriorly. Stable left renal cyst. No worrisome hepatic or adrenal gland lesions are identified without contrast. Musculoskeletal: No significant bony findings. Remote healed left rib fractures again noted. IMPRESSION: 1. Slight interval decrease in size of the left upper lobe pulmonary nodule. Recommend continued surveillance. 2. Persistent new and progressive areas of subpleural airspace opacity likely a combination of atelectasis and inflammation. Recommend short-term follow-up noncontrast chest CT in 3 months to make sure these resolve. 3. Stable severe emphysematous changes and areas of pulmonary scarring. 4. Stable age advanced atherosclerotic calcifications involving the aorta and coronary arteries. 5. Stable pulmonary arterial enlargement suggesting pulmonary hypertension. 6. Cholelithiasis. 7. Emphysema and aortic atherosclerosis. Aortic Atherosclerosis (ICD10-I70.0) and Emphysema (ICD10-J43.9). Electronically Signed   By: Marijo Sanes M.D.   On: 11/20/2020 16:05   DG Chest Port 1 View  Result Date: 11/16/2020 CLINICAL DATA:  Respiratory distress, abnormal chest x-ray, history asthma, COPD, diabetes mellitus, hypertension EXAM: PORTABLE CHEST 1 VIEW COMPARISON:  Portable exam 0656 hours compared to 11/13/2020 FINDINGS: Normal heart size, mediastinal contours, and pulmonary vascularity. Atherosclerotic calcification aorta. Emphysematous  and bronchitic changes consistent with COPD. Scattered chronic interstitial lung changes with superimposed acute infiltrate again identified in the RIGHT upper lobe, less at RIGHT base. No pleural effusion or pneumothorax. Bones demineralized. IMPRESSION: Changes of COPD and chronic interstitial lung disease with persistent superimposed RIGHT lung infiltrates. Electronically Signed   By: Lavonia Dana M.D.   On: 11/16/2020 08:34   Portable chest 1 View  Result Date: 11/13/2020 CLINICAL DATA:  Pneumonia. EXAM: PORTABLE CHEST 1 VIEW COMPARISON:  Chest x-ray 11/12/2020 FINDINGS: The cardiac silhouette, mediastinal and hilar contours are within normal limits and stable. Stable underlying emphysematous changes. Persistent interstitial and airspace process in the lungs. No pleural effusions. IMPRESSION: Persistent interstitial and airspace process in the lungs. Electronically Signed   By: Marijo Sanes M.D.   On: 11/13/2020 07:38   DG Chest Port 1 View  Result Date: 11/12/2020 CLINICAL DATA:  Respiratory distress, COPD, sepsis EXAM: PORTABLE CHEST 1 VIEW COMPARISON:  03/25/2020 FINDINGS: 2 frontal views of the chest demonstrate a stable cardiac silhouette. There is extensive background scarring and fibrosis compatible with emphysema. Multifocal areas of airspace disease are identified consistent with superimposed pneumonia. This is most pronounced within the suprahilar regions. The left upper lobe nodule previously evaluated by PET scan again noted over the left anterior fifth rib. No effusion or pneumothorax. No acute bony abnormalities. IMPRESSION: 1. Multifocal bilateral airspace disease concerning for edema or infection superimposed upon background emphysema. 2. Persistent left upper lobe nodule previously evaluated by CT and PET scan. Electronically Signed   By: Randa Ngo M.D.   On: 11/12/2020 21:42     Assessment and plan- Patient is a 64 y.o. male with history of stage I presumed lung cancer of the  left upper lobe  s/p SBRT and this is a routine follow-up visit as well as to discuss results of anemia work-up  CT chest without contrast on 11/20/2020 showed interval decrease in the size of the left Upper lobe nodule after SBRT.  Patient also noted to have subpleural airspace opacities which were new and progressive likely secondary to his recent episode of pneumonia.  We will continue to monitor this with a repeat CT scan in 3 months time  With regards to his anemia his blood work is suggestive of low B12 as well as low iron levels.  I therefore recommend 5 doses of Venofer 200 mg IV weekly along with 5 doses of B12.  Repeat CBC ferritin and iron studies B12 levels with next visit in 3 months   Visit Diagnosis 1. Lung nodule   2. Nodule of upper lobe of left lung   3. Other iron deficiency anemia   4. Normocytic anemia   5. Iron deficiency anemia, unspecified iron deficiency anemia type   6. B12 deficiency      Dr. Randa Evens, MD, MPH [facility] [phone] 11/27/2020 12:20 PM

## 2020-12-04 ENCOUNTER — Encounter (INDEPENDENT_AMBULATORY_CARE_PROVIDER_SITE_OTHER): Payer: Self-pay | Admitting: Nurse Practitioner

## 2020-12-04 ENCOUNTER — Ambulatory Visit (INDEPENDENT_AMBULATORY_CARE_PROVIDER_SITE_OTHER): Payer: Medicare HMO

## 2020-12-04 ENCOUNTER — Other Ambulatory Visit: Payer: Self-pay

## 2020-12-04 ENCOUNTER — Ambulatory Visit (INDEPENDENT_AMBULATORY_CARE_PROVIDER_SITE_OTHER): Payer: Medicare HMO | Admitting: Nurse Practitioner

## 2020-12-04 VITALS — BP 121/72 | HR 87 | Ht 68.0 in | Wt 172.0 lb

## 2020-12-04 DIAGNOSIS — E785 Hyperlipidemia, unspecified: Secondary | ICD-10-CM | POA: Diagnosis not present

## 2020-12-04 DIAGNOSIS — I714 Abdominal aortic aneurysm, without rupture, unspecified: Secondary | ICD-10-CM

## 2020-12-04 DIAGNOSIS — I713 Abdominal aortic aneurysm, ruptured, unspecified: Secondary | ICD-10-CM

## 2020-12-04 DIAGNOSIS — I70212 Atherosclerosis of native arteries of extremities with intermittent claudication, left leg: Secondary | ICD-10-CM | POA: Diagnosis not present

## 2020-12-04 DIAGNOSIS — I10 Essential (primary) hypertension: Secondary | ICD-10-CM

## 2020-12-04 NOTE — Progress Notes (Signed)
Subjective:    Patient ID: Christian Fields, male    DOB: Sep 05, 1956, 64 y.o.   MRN: [id number] Chief Complaint  Patient presents with  . AAA  . Follow-up    U/S      The patient returns to the office for surveillance of a known abdominal aortic aneurysm. Patient denies abdominal pain or back pain, no other abdominal complaints. No changes suggesting embolic episodes.  No interval shortening of the patient's claudication distance or development of rest pain symptoms. No new ulcers or wounds have occurred since the last visit.   There have been no interval changes in the patient's overall health care since his last visit, however the patient has had Covid with pneumonia shortly thereafter.  Patient was also recently discharged from the hospital with an episode of pneumonia.  Patient denies amaurosis fugax or TIA symptoms. There is no history of claudication or rest pain symptoms of the lower extremities. The patient denies angina or shortness of breath.   Duplex US of the aorta and iliac arteries shows an AAA measured 4.2 cm with no  iliac artery aneurysms.  Previously the patient's abdominal aortic measurement was 3.3 cm on 10/29/2019.  ABI Rt=1.13 and Lt=1.01  (previous ABI's Rt=1.04 and Lt=1.04) Duplex ultrasound of the bilateral tibial arteries reveals triphasic waveforms with good toe waveforms    Review of Systems  Respiratory:       Home 02  Neurological: Positive for weakness.  All other systems reviewed and are negative.      Objective:   Physical Exam Vitals reviewed.  HENT:     Head: Normocephalic.  Cardiovascular:     Rate and Rhythm: Normal rate.     Pulses: Normal pulses.  Pulmonary:     Effort: Pulmonary effort is normal.     Comments: Home 02 Skin:    General: Skin is warm and dry.  Neurological:     Mental Status: He is alert and oriented to person, place, and time.     Motor: Weakness present.     Gait: Gait abnormal.  Psychiatric:        Mood and  Affect: Mood normal.        Behavior: Behavior normal.        Thought Content: Thought content normal.        Judgment: Judgment normal.     BP 121/72   Pulse 87   Ht 5\' 8"  (1.727 m)   Wt 172 lb (78 kg)   BMI 26.15 kg/m   Past Medical History:  Diagnosis Date  . Anemia   . Anxiety   . Arthritis   . Asthma   . Cancer ([place])    Basal Cell Skin Cancer  . Chronic back pain   . COPD (chronic obstructive pulmonary disease) ([place])   . Depression   . Diabetes mellitus ([place])   . Dyspnea   . GERD (gastroesophageal reflux disease)   . Gout   . Gout   . Headache   . History of blood clots    Left Leg--July 2018  . History of kidney stones   . Hyperlipidemia   . Hyperlipidemia   . Hypertension   . Kidney stones   . Neuropathy   . On home oxygen therapy    2 L / M  . Pneumonia 06/2017  . Sleep apnea   . Ulcer of foot ([place])    Right    Social History   Socioeconomic History  . Marital  status: Widowed    Spouse name: Not on file  . Number of children: Not on file  . Years of education: Not on file  . Highest education level: Not on file  Occupational History  . Not on file  Tobacco Use  . Smoking status: Former Smoker    Packs/day: 0.50    Types: Cigarettes    Start date: 2018  . Smokeless tobacco: Never Used  Vaping Use  . Vaping Use: Never used  Substance and Sexual Activity  . Alcohol use: Yes    Alcohol/week: 1.0 standard drink    Types: 1 Cans of beer per week    Comment: occ  . Drug use: No  . Sexual activity: Not on file  Other Topics Concern  . Not on file  Social History Narrative  . Not on file   Social Determinants of Health   Financial Resource Strain: Not on file  Food Insecurity: Not on file  Transportation Needs: Not on file  Physical Activity: Not on file  Stress: Not on file  Social Connections: Not on file  Intimate Partner Violence: Not on file    Past Surgical History:  Procedure Laterality Date  . APPENDECTOMY    . DG FEET  2 VIEWS BILAT    . LIPOMA EXCISION Right 08/15/2017   Procedure: EXCISION TUMOR(CYST) FOOT;  Surgeon: Albertine Patricia, DPM;  Location: [facility] ORS;  Service: Podiatry;  Laterality: Right;  . OTHER SURGICAL HISTORY Bilateral Foot surgery    Family History  Problem Relation Age of Onset  . Hypertension Mother     Allergies  Allergen Reactions  . Bee Venom Anaphylaxis  . Hydrocodone-Acetaminophen Itching    CBC Latest Ref Rng & Units 11/16/2020 11/15/2020 11/13/2020  WBC 4.0 - 10.5 K/uL 12.0(H) 20.4(H) 51.2(HH)  Hemoglobin 13.0 - 17.0 g/dL 10.8(L) 9.0(L) 10.1(L)  Hematocrit 39.0 - 52.0 % 35.6(L) 30.0(L) 33.3(L)  Platelets 150 - 400 K/uL 577(H) 505(H) 480(H)      CMP     Component Value Date/Time   NA 140 11/16/2020 0534   NA 136 03/31/2014 1815   K 4.0 11/16/2020 0534   K 3.5 05/17/2014 1424   CL 98 11/16/2020 0534   CL 100 03/31/2014 1815   CO2 31 11/16/2020 0534   CO2 30 03/31/2014 1815   GLUCOSE 90 11/16/2020 0534   GLUCOSE 91 05/17/2014 1424   BUN 29 (H) 11/16/2020 0534   BUN 13 03/31/2014 1815   CREATININE 1.06 11/17/2020 0508   CREATININE 1.05 03/31/2014 1815   CALCIUM 9.5 11/16/2020 0534   CALCIUM 9.3 03/31/2014 1815   PROT 7.3 11/12/2020 2108   PROT 7.1 09/24/2013 1918   ALBUMIN 2.9 (L) 11/12/2020 2108   ALBUMIN 3.6 09/24/2013 1918   AST 15 11/12/2020 2108   AST 38 (H) 09/24/2013 1918   ALT 12 11/12/2020 2108   ALT 36 09/24/2013 1918   ALKPHOS 108 11/12/2020 2108   ALKPHOS 128 09/24/2013 1918   BILITOT 0.5 11/12/2020 2108   BILITOT 0.4 09/24/2013 1918   GFRNONAA >60 11/17/2020 0508   GFRNONAA >60 03/31/2014 1815   GFRAA >60 03/28/2020 0427   GFRAA >60 03/31/2014 1815     No results found.     Assessment & Plan:   1. Atherosclerosis of native artery of left lower extremity with intermittent claudication (HCC)  Recommend:  The patient has evidence of atherosclerosis of the lower extremities with claudication.  The patient does not voice lifestyle  limiting changes at this point in  time.  Noninvasive studies do not suggest clinically significant change.  No invasive studies, angiography or surgery at this time The patient should continue walking and begin a more formal exercise program.  The patient should continue antiplatelet therapy and aggressive treatment of the lipid abnormalities  No changes in the patient's medications at this time  The patient should continue wearing graduated compression socks 10-15 mmHg strength to control the mild edema.   2. Essential hypertension Continue antihypertensive medications as already ordered, these medications have been reviewed and there are no changes at this time.   4. Hyperlipidemia, unspecified hyperlipidemia type Continue statin as ordered and reviewed, no changes at this time   5. Abdominal aortic aneurysm, ruptured (HCC) Recommend: The patient has an abdominal aortic aneurysm that is less than 5.0 cm by duplex scan however it shown rapid growth of 1 cm over the last year and based on this study it appears that it is suitable for endovascular treatment.  The patient is otherwise in reasonable health.   Therefore, the patient should undergo endovascular repair of the AAA to prevent future leathal rupture.   Patient will require CT angiography of the abdomen and pelvis in order to appropriately assess growth of AAA and plan possible repair of the AAA.  The risks and benefits as well as the alternative therapies was discussed in detail with the patient. All questions were answered. The patient agrees to move forward with CT scan.  The patient will follow up with me in the office after the CT scan to review the study. - CT Angio Abd/Pel w/ and/or w/o; Future   Current Outpatient Medications on File Prior to Visit  Medication Sig Dispense Refill  . albuterol (VENTOLIN HFA) 108 (90 Base) MCG/ACT inhaler Inhale 2 puffs into the lungs every 6 (six) hours as needed for wheezing or  shortness of breath.    [name] Kitchen alendronate (FOSAMAX) 70 MG tablet Take 70 mg by mouth once a week.     [name] Kitchen allopurinol (ZYLOPRIM) 300 MG tablet Take 600 mg by mouth 2 (two) times daily.    [name] Kitchen aspirin EC 81 MG tablet Take 1 tablet by mouth daily.    . buprenorphine-naloxone (SUBOXONE) 8-2 mg SUBL SL tablet Place 1 tablet under the tongue 3 (three) times daily.    . cholecalciferol (VITAMIN D) 1000 units tablet Take 1,000 Units by mouth daily.    . clopidogrel (PLAVIX) 75 MG tablet Take 75 mg by mouth daily.     . DULoxetine (CYMBALTA) 30 MG capsule Take 1 capsule by mouth daily.    [name] Kitchen ezetimibe (ZETIA) 10 MG tablet Take 10 mg by mouth daily.    [name] Kitchen FARXIGA 10 MG TABS tablet Take 10 mg by mouth daily.    . ferrous sulfate 325 (65 FE) MG tablet Take 325 mg by mouth daily with breakfast.    . fluticasone (FLONASE) 50 MCG/ACT nasal spray Place 2 sprays into both nostrils daily.  5  . gabapentin (NEURONTIN) 800 MG tablet Take 800 mg by mouth 4 (four) times daily.  0  . hydrochlorothiazide (HYDRODIURIL) 12.5 MG tablet Take 1 tablet (12.5 mg total) by mouth daily.    [name] Kitchen KOMBIGLYZE XR 2.04-999 MG TB24 Take 1 tablet by mouth 2 (two) times daily.    . metoprolol succinate (TOPROL-XL) 50 MG 24 hr tablet Take 50 mg by mouth daily.    . ondansetron (ZOFRAN) 4 MG tablet Take 4 mg by mouth every 8 (eight) hours as needed.    [name] Kitchen  OXYGEN Inhale 3 L into the lungs.    . pantoprazole (PROTONIX) 40 MG tablet Take 40 mg by mouth daily.  1  . simvastatin (ZOCOR) 40 MG tablet Take 1 tablet by mouth daily at 6 PM.    . TRELEGY ELLIPTA 100-62.5-25 MCG/INH AEPB Inhale 1 puff into the lungs daily.    . ZUBSOLV 8.6-2.1 MG SUBL Place 1 tablet under the tongue in the morning, at noon, and at bedtime.    [name] Kitchen ipratropium (ATROVENT) 0.02 % nebulizer solution Inhale 2.5 mL (500 mcg total) by nebulization every 6 (six) hours.     No current facility-administered medications on file prior to visit.    There are no Patient Instructions on  file for this visit. No follow-ups on file.   Kris Hartmann, NP

## 2020-12-11 ENCOUNTER — Inpatient Hospital Stay: Payer: Medicare HMO | Attending: Oncology

## 2020-12-11 VITALS — BP 104/66 | HR 84 | Temp 97.5°F | Resp 22

## 2020-12-11 DIAGNOSIS — I10 Essential (primary) hypertension: Secondary | ICD-10-CM | POA: Insufficient documentation

## 2020-12-11 DIAGNOSIS — E538 Deficiency of other specified B group vitamins: Secondary | ICD-10-CM | POA: Diagnosis present

## 2020-12-11 DIAGNOSIS — J449 Chronic obstructive pulmonary disease, unspecified: Secondary | ICD-10-CM | POA: Diagnosis not present

## 2020-12-11 DIAGNOSIS — E119 Type 2 diabetes mellitus without complications: Secondary | ICD-10-CM | POA: Insufficient documentation

## 2020-12-11 DIAGNOSIS — R000 Tachycardia, unspecified: Secondary | ICD-10-CM | POA: Insufficient documentation

## 2020-12-11 DIAGNOSIS — R0602 Shortness of breath: Secondary | ICD-10-CM | POA: Diagnosis not present

## 2020-12-11 DIAGNOSIS — D509 Iron deficiency anemia, unspecified: Secondary | ICD-10-CM | POA: Insufficient documentation

## 2020-12-11 DIAGNOSIS — Z79899 Other long term (current) drug therapy: Secondary | ICD-10-CM | POA: Insufficient documentation

## 2020-12-11 DIAGNOSIS — C3412 Malignant neoplasm of upper lobe, left bronchus or lung: Secondary | ICD-10-CM | POA: Insufficient documentation

## 2020-12-11 DIAGNOSIS — D508 Other iron deficiency anemias: Secondary | ICD-10-CM

## 2020-12-11 MED ORDER — IRON SUCROSE 20 MG/ML IV SOLN
200.0000 mg | Freq: Once | INTRAVENOUS | Status: AC
  Administered 2020-12-11: 200 mg via INTRAVENOUS
  Filled 2020-12-11: qty 10

## 2020-12-11 MED ORDER — CYANOCOBALAMIN 1000 MCG/ML IJ SOLN
1000.0000 ug | INTRAMUSCULAR | Status: DC
  Administered 2020-12-11: 1000 ug via INTRAMUSCULAR
  Filled 2020-12-11: qty 1

## 2020-12-11 MED ORDER — SODIUM CHLORIDE 0.9 % IV SOLN
Freq: Once | INTRAVENOUS | Status: AC
Start: 2020-12-11 — End: 2020-12-11
  Filled 2020-12-11: qty 250

## 2020-12-11 MED ORDER — SODIUM CHLORIDE 0.9 % IV SOLN: 200.0000 mg | INTRAVENOUS | Status: DC

## 2020-12-11 NOTE — Progress Notes (Signed)
B12 injection administered to right deltoid, site benign.  Pt tolerated venofer infusion with no complaints.  Pt was monitored for 30 minutes post infusion.  Pt left infusion suite stable and ambulatory.

## 2020-12-13 ENCOUNTER — Inpatient Hospital Stay: Payer: Medicare HMO

## 2020-12-13 VITALS — BP 108/63 | HR 70 | Resp 18

## 2020-12-13 DIAGNOSIS — D509 Iron deficiency anemia, unspecified: Secondary | ICD-10-CM | POA: Diagnosis not present

## 2020-12-13 DIAGNOSIS — D508 Other iron deficiency anemias: Secondary | ICD-10-CM

## 2020-12-13 MED ORDER — SODIUM CHLORIDE 0.9 % IV SOLN
Freq: Once | INTRAVENOUS | Status: AC
  Filled 2020-12-13: qty 250

## 2020-12-13 MED ORDER — IRON SUCROSE 20 MG/ML IV SOLN
200.0000 mg | Freq: Once | INTRAVENOUS | Status: AC
  Administered 2020-12-13: 200 mg via INTRAVENOUS
  Filled 2020-12-13: qty 10

## 2020-12-13 MED ORDER — SODIUM CHLORIDE 0.9 % IV SOLN: 200.0000 mg | INTRAVENOUS | Status: DC

## 2020-12-13 NOTE — Progress Notes (Signed)
Christian Fields tolerated his Venofer infusion today without any complications. Vital signs stable at discharge.

## 2020-12-18 ENCOUNTER — Inpatient Hospital Stay: Payer: Medicare HMO

## 2020-12-20 ENCOUNTER — Other Ambulatory Visit: Payer: Self-pay

## 2020-12-20 ENCOUNTER — Inpatient Hospital Stay (HOSPITAL_BASED_OUTPATIENT_CLINIC_OR_DEPARTMENT_OTHER): Payer: Medicare HMO | Admitting: Oncology

## 2020-12-20 ENCOUNTER — Inpatient Hospital Stay: Payer: Medicare HMO

## 2020-12-20 DIAGNOSIS — D509 Iron deficiency anemia, unspecified: Secondary | ICD-10-CM | POA: Diagnosis not present

## 2020-12-20 DIAGNOSIS — D508 Other iron deficiency anemias: Secondary | ICD-10-CM

## 2020-12-20 NOTE — Progress Notes (Signed)
Patient presents to infusion suite with tachycardia and shortness of breath that is worse than his baseline.  He reports that several family members have tested positive in his household.  Sonia Baller NP to chairside to assess patient.  Pt left infusion area stable in a wheelchair to go get a COVID test prior to returning to clinic.

## 2020-12-25 ENCOUNTER — Inpatient Hospital Stay: Payer: Medicare HMO

## 2020-12-25 NOTE — Progress Notes (Signed)
Re: shortness of breath and tachycardia.  Patient presented to clinic today for iron infusion.  Patient has past medical history significant for hypertension, AAA, TIA, COPD, left lung cancer, emphysema, diabetes gout and iron deficiency anemia.  He has received 2 doses of IV Venofer on 12/11/2020 and 12/13/2020.  He is also received vitamin B12 injection.  Prior to infusion, vital signs show low oxygen saturations and tachycardia.  He does not currently wear oxygen.  Patient does admit to walking into the clinic versus using a wheelchair which he usually does.  After 15 minutes of rest, vital signs did not improve.  He does admit to his wife being tested for COVID.  He is vaccinated but has not received his booster.  He does have a cough with shortness of breath.  Review of Systems  Constitutional: Negative.  Negative for chills, fever, malaise/fatigue and weight loss.  HENT: Negative for congestion, ear pain and tinnitus.   Eyes: Negative.  Negative for blurred vision and double vision.  Respiratory: Positive for cough and shortness of breath. Negative for sputum production.   Cardiovascular: Negative.  Negative for chest pain, palpitations and leg swelling.  Gastrointestinal: Negative.  Negative for abdominal pain, constipation, diarrhea, nausea and vomiting.  Genitourinary: Negative for dysuria, frequency and urgency.  Musculoskeletal: Negative for back pain and falls.  Skin: Negative.  Negative for rash.  Neurological: Negative.  Negative for weakness and headaches.  Endo/Heme/Allergies: Negative.  Does not bruise/bleed easily.  Psychiatric/Behavioral: Negative.  Negative for depression. The patient is not nervous/anxious and does not have insomnia.    Physical Exam Constitutional:      General: Vital signs are normal.     Appearance: Normal appearance.  HENT:     Head: Normocephalic and atraumatic.  Eyes:     Pupils: Pupils are equal, round, and reactive to light.  Cardiovascular:      Rate and Rhythm: Regular rhythm. Tachycardia present.     Heart sounds: Normal heart sounds. No murmur heard.     Comments: HR 110-120- Sinus Tach Pulmonary:     Effort: Pulmonary effort is normal.     Breath sounds: Decreased breath sounds present. No wheezing.     Comments: O2 sats low 90's Abdominal:     General: Bowel sounds are normal. There is no distension.     Palpations: Abdomen is soft.     Tenderness: There is no abdominal tenderness.  Musculoskeletal:        General: No edema. Normal range of motion.     Cervical back: Normal range of motion.  Skin:    General: Skin is warm and dry.     Findings: No rash.  Neurological:     Mental Status: He is alert and oriented to person, place, and time.  Psychiatric:        Judgment: Judgment normal.    Recommendations: Patient to have COVID-19 testing prior to infusion and injection.  Patient agreeable.  Disposition: Patient will call clinic with results and to reschedule his iron and B12.  Faythe Casa, NP 12/25/2020 12:26 PM

## 2020-12-27 ENCOUNTER — Ambulatory Visit: Payer: Medicare HMO

## 2020-12-29 ENCOUNTER — Ambulatory Visit: Admission: RE | Admit: 2020-12-29 | Payer: Medicare HMO | Source: Ambulatory Visit

## 2021-01-01 ENCOUNTER — Ambulatory Visit: Payer: Medicare HMO

## 2021-01-05 ENCOUNTER — Telehealth: Payer: Self-pay | Admitting: Oncology

## 2021-01-05 NOTE — Telephone Encounter (Signed)
Returned phone call from pt's daughter Drue Dun) to r/s missed appointments. Confirmed with daughter new scheduled appts on 2/7. Sending AVS via mail.

## 2021-01-08 ENCOUNTER — Telehealth: Payer: Self-pay | Admitting: Oncology

## 2021-01-08 NOTE — Telephone Encounter (Signed)
Called pt to make him aware of infusion appt on 01/17/21. Pt confirmed time.

## 2021-01-15 ENCOUNTER — Inpatient Hospital Stay: Payer: Medicare HMO

## 2021-01-15 ENCOUNTER — Telehealth: Payer: Self-pay | Admitting: Oncology

## 2021-01-15 NOTE — Telephone Encounter (Signed)
Pts. Daughter called canceling pts infusion appt for 2/7 stating that he would be there on 2/9.  I updated the appt note on 2/9 to reflect #4 infusion and that he needed a b12 inj.  Routing to scheduler to reschedule the #5 infusion.

## 2021-01-17 ENCOUNTER — Inpatient Hospital Stay: Payer: Medicare HMO | Attending: Oncology

## 2021-01-29 ENCOUNTER — Emergency Department: Payer: Medicare HMO

## 2021-01-29 ENCOUNTER — Inpatient Hospital Stay: Payer: Medicare HMO

## 2021-01-29 ENCOUNTER — Observation Stay
Admission: EM | Admit: 2021-01-29 | Discharge: 2021-02-01 | Disposition: A | Discharge status: Home / Self Care | Payer: Medicare HMO | Attending: Family Medicine | Admitting: Family Medicine

## 2021-01-29 ENCOUNTER — Other Ambulatory Visit: Payer: Self-pay

## 2021-01-29 ENCOUNTER — Encounter: Payer: Self-pay | Admitting: Radiology

## 2021-01-29 DIAGNOSIS — G319 Degenerative disease of nervous system, unspecified: Secondary | ICD-10-CM | POA: Diagnosis not present

## 2021-01-29 DIAGNOSIS — F1920 Other psychoactive substance dependence, uncomplicated: Secondary | ICD-10-CM

## 2021-01-29 DIAGNOSIS — Z79899 Other long term (current) drug therapy: Secondary | ICD-10-CM | POA: Insufficient documentation

## 2021-01-29 DIAGNOSIS — Z85828 Personal history of other malignant neoplasm of skin: Secondary | ICD-10-CM | POA: Diagnosis not present

## 2021-01-29 DIAGNOSIS — G9341 Metabolic encephalopathy: Secondary | ICD-10-CM | POA: Diagnosis not present

## 2021-01-29 DIAGNOSIS — E119 Type 2 diabetes mellitus without complications: Secondary | ICD-10-CM | POA: Diagnosis not present

## 2021-01-29 DIAGNOSIS — I10 Essential (primary) hypertension: Secondary | ICD-10-CM | POA: Diagnosis present

## 2021-01-29 DIAGNOSIS — J9601 Acute respiratory failure with hypoxia: Secondary | ICD-10-CM | POA: Diagnosis present

## 2021-01-29 DIAGNOSIS — R0602 Shortness of breath: Secondary | ICD-10-CM | POA: Diagnosis present

## 2021-01-29 DIAGNOSIS — J9611 Chronic respiratory failure with hypoxia: Secondary | ICD-10-CM | POA: Diagnosis not present

## 2021-01-29 DIAGNOSIS — Z20822 Contact with and (suspected) exposure to covid-19: Secondary | ICD-10-CM | POA: Insufficient documentation

## 2021-01-29 DIAGNOSIS — J9621 Acute and chronic respiratory failure with hypoxia: Secondary | ICD-10-CM | POA: Diagnosis present

## 2021-01-29 DIAGNOSIS — I70219 Atherosclerosis of native arteries of extremities with intermittent claudication, unspecified extremity: Secondary | ICD-10-CM | POA: Diagnosis present

## 2021-01-29 DIAGNOSIS — J441 Chronic obstructive pulmonary disease with (acute) exacerbation: Secondary | ICD-10-CM | POA: Diagnosis not present

## 2021-01-29 DIAGNOSIS — J449 Chronic obstructive pulmonary disease, unspecified: Secondary | ICD-10-CM | POA: Insufficient documentation

## 2021-01-29 DIAGNOSIS — I714 Abdominal aortic aneurysm, without rupture, unspecified: Secondary | ICD-10-CM | POA: Diagnosis present

## 2021-01-29 DIAGNOSIS — J9622 Acute and chronic respiratory failure with hypercapnia: Secondary | ICD-10-CM | POA: Diagnosis present

## 2021-01-29 DIAGNOSIS — Z87891 Personal history of nicotine dependence: Secondary | ICD-10-CM | POA: Insufficient documentation

## 2021-01-29 DIAGNOSIS — Z8616 Personal history of COVID-19: Secondary | ICD-10-CM | POA: Diagnosis not present

## 2021-01-29 DIAGNOSIS — Z7982 Long term (current) use of aspirin: Secondary | ICD-10-CM | POA: Insufficient documentation

## 2021-01-29 DIAGNOSIS — R4182 Altered mental status, unspecified: Secondary | ICD-10-CM | POA: Diagnosis not present

## 2021-01-29 DIAGNOSIS — Y900 Blood alcohol level of less than 20 mg/100 ml: Secondary | ICD-10-CM | POA: Insufficient documentation

## 2021-01-29 DIAGNOSIS — J45909 Unspecified asthma, uncomplicated: Secondary | ICD-10-CM | POA: Insufficient documentation

## 2021-01-29 DIAGNOSIS — Z79891 Long term (current) use of opiate analgesic: Secondary | ICD-10-CM

## 2021-01-29 DIAGNOSIS — C3492 Malignant neoplasm of unspecified part of left bronchus or lung: Secondary | ICD-10-CM | POA: Diagnosis present

## 2021-01-29 LAB — CBC WITH DIFFERENTIAL/PLATELET
Abs Immature Granulocytes: 0.11 10*3/uL — ABNORMAL HIGH (ref 0.00–0.07)
Basophils Absolute: 0.1 10*3/uL (ref 0.0–0.1)
Basophils Relative: 0 %
Eosinophils Absolute: 0.2 10*3/uL (ref 0.0–0.5)
Eosinophils Relative: 1 %
HCT: 37.3 % — ABNORMAL LOW (ref 39.0–52.0)
Hemoglobin: 11.5 g/dL — ABNORMAL LOW (ref 13.0–17.0)
Immature Granulocytes: 1 %
Lymphocytes Relative: 7 %
Lymphs Abs: 1.3 10*3/uL (ref 0.7–4.0)
MCH: 29 pg (ref 26.0–34.0)
MCHC: 30.8 g/dL (ref 30.0–36.0)
MCV: 94.2 fL (ref 80.0–100.0)
Monocytes Absolute: 0.9 10*3/uL (ref 0.1–1.0)
Monocytes Relative: 5 %
Neutro Abs: 16.2 10*3/uL — ABNORMAL HIGH (ref 1.7–7.7)
Neutrophils Relative %: 86 %
Platelets: 219 10*3/uL (ref 150–400)
RBC: 3.96 MIL/uL — ABNORMAL LOW (ref 4.22–5.81)
RDW: 15.8 % — ABNORMAL HIGH (ref 11.5–15.5)
WBC: 18.8 10*3/uL — ABNORMAL HIGH (ref 4.0–10.5)
nRBC: 0.1 % (ref 0.0–0.2)

## 2021-01-29 LAB — RESP PANEL BY RT-PCR (FLU A&B, COVID) ARPGX2
Influenza A by PCR: NEGATIVE
Influenza B by PCR: NEGATIVE
SARS Coronavirus 2 by RT PCR: NEGATIVE

## 2021-01-29 LAB — COMPREHENSIVE METABOLIC PANEL
ALT: 8 U/L (ref 0–44)
AST: 22 U/L (ref 15–41)
Albumin: 3.5 g/dL (ref 3.5–5.0)
Alkaline Phosphatase: 90 U/L (ref 38–126)
Anion gap: 10 (ref 5–15)
BUN: 19 mg/dL (ref 8–23)
CO2: 29 mmol/L (ref 22–32)
Calcium: 8.9 mg/dL (ref 8.9–10.3)
Chloride: 94 mmol/L — ABNORMAL LOW (ref 98–111)
Creatinine, Ser: 1.32 mg/dL — ABNORMAL HIGH (ref 0.61–1.24)
GFR, Estimated: 59 mL/min — ABNORMAL LOW (ref >60)
Glucose, Bld: 107 mg/dL — ABNORMAL HIGH (ref 70–99)
Potassium: 4.4 mmol/L (ref 3.5–5.1)
Sodium: 133 mmol/L — ABNORMAL LOW (ref 135–145)
Total Bilirubin: 0.6 mg/dL (ref 0.3–1.2)
Total Protein: 7.5 g/dL (ref 6.5–8.1)

## 2021-01-29 LAB — ETHANOL: Alcohol, Ethyl (B): <10 mg/dL (ref <10)

## 2021-01-29 LAB — LACTIC ACID, PLASMA: Lactic Acid, Venous: 1.9 mmol/L (ref 0.5–1.9)

## 2021-01-29 LAB — D-DIMER, QUANTITATIVE: D-Dimer, Quant: 1.4 ug/mL-FEU — ABNORMAL HIGH (ref 0.00–0.50)

## 2021-01-29 LAB — PROCALCITONIN: Procalcitonin: <0.1 ng/mL

## 2021-01-29 LAB — TROPONIN I (HIGH SENSITIVITY)
Troponin I (High Sensitivity): 9 ng/L (ref <18)
Troponin I (High Sensitivity): 10 ng/L (ref <18)

## 2021-01-29 LAB — BRAIN NATRIURETIC PEPTIDE: B Natriuretic Peptide: 182.6 pg/mL — ABNORMAL HIGH (ref 0.0–100.0)

## 2021-01-29 MED ORDER — ENOXAPARIN SODIUM 40 MG/0.4ML ~~LOC~~ SOLN
40.0000 mg | SUBCUTANEOUS | Status: DC
  Administered 2021-01-30 – 2021-02-01 (×3): 40 mg via SUBCUTANEOUS
  Filled 2021-01-29 (×3): qty 0.4

## 2021-01-29 MED ORDER — ALBUTEROL SULFATE (2.5 MG/3ML) 0.083% IN NEBU
2.5000 mg | INHALATION_SOLUTION | RESPIRATORY_TRACT | Status: DC | PRN
Start: 2021-01-29 — End: 2021-02-01

## 2021-01-29 MED ORDER — METHYLPREDNISOLONE SODIUM SUCC 125 MG IJ SOLR
125.0000 mg | Freq: Once | INTRAMUSCULAR | Status: AC
  Administered 2021-01-29: 125 mg via INTRAVENOUS
  Filled 2021-01-29: qty 2

## 2021-01-29 MED ORDER — PREDNISONE 20 MG PO TABS: 40.0000 mg | ORAL_TABLET | Freq: Every day | ORAL | Status: DC

## 2021-01-29 MED ORDER — IPRATROPIUM-ALBUTEROL 0.5-2.5 (3) MG/3ML IN SOLN
3.0000 mL | Freq: Four times a day (QID) | RESPIRATORY_TRACT | Status: DC
  Administered 2021-01-30 – 2021-01-31 (×5): 3 mL via RESPIRATORY_TRACT
  Filled 2021-01-29 (×5): qty 3

## 2021-01-29 MED ORDER — SODIUM CHLORIDE 0.9 % IV SOLN
1.0000 g | INTRAVENOUS | Status: DC
  Administered 2021-01-30 – 2021-01-31 (×2): 1 g via INTRAVENOUS
  Filled 2021-01-29 (×2): qty 10
  Filled 2021-01-29: qty 1

## 2021-01-29 MED ORDER — LACTATED RINGERS IV BOLUS
1000.0000 mL | Freq: Once | INTRAVENOUS | Status: AC
  Administered 2021-01-29: 1000 mL via INTRAVENOUS

## 2021-01-29 MED ORDER — KETOROLAC TROMETHAMINE 30 MG/ML IJ SOLN
30.0000 mg | Freq: Once | INTRAMUSCULAR | Status: AC
  Administered 2021-01-29: 30 mg via INTRAVENOUS
  Filled 2021-01-29: qty 1

## 2021-01-29 MED ORDER — SODIUM CHLORIDE 0.9 % IV SOLN
500.0000 mg | Freq: Once | INTRAVENOUS | Status: AC
  Administered 2021-01-29: 500 mg via INTRAVENOUS
  Filled 2021-01-29: qty 500

## 2021-01-29 MED ORDER — METHYLPREDNISOLONE SODIUM SUCC 40 MG IJ SOLR: 40.0000 mg | Freq: Four times a day (QID) | INTRAMUSCULAR | Status: DC

## 2021-01-29 MED ORDER — IPRATROPIUM-ALBUTEROL 0.5-2.5 (3) MG/3ML IN SOLN
9.0000 mL | Freq: Once | RESPIRATORY_TRACT | Status: AC
  Administered 2021-01-29: 9 mL via RESPIRATORY_TRACT
  Filled 2021-01-29: qty 9

## 2021-01-29 MED ORDER — IOHEXOL 350 MG/ML SOLN
75.0000 mL | Freq: Once | INTRAVENOUS | Status: AC | PRN
  Administered 2021-01-29: 75 mL via INTRAVENOUS

## 2021-01-29 MED ORDER — SODIUM CHLORIDE 0.9 % IV SOLN
1.0000 g | Freq: Once | INTRAVENOUS | Status: AC
  Administered 2021-01-29: 1 g via INTRAVENOUS
  Filled 2021-01-29: qty 10

## 2021-01-29 NOTE — H&P (Incomplete)
History and Physical    Christian Fields ZES:[id number] DOB: October 18, 1956 DOA: 01/29/2021  PCP: Remi Haggard, FNP   Patient coming from: home  I have personally briefly reviewed patient's old medical records in [place]  Chief Complaint: altered mental status  HPI: Christian Fields is a 65 y.o. male with medical history significant for COPD on 2 L home O2,  NSCLC LUL s/p XRT, anemia, DM2, HTN, OSA, chronic back pain on Suboxone, who was brought in by EMS after he was seen to slump to the ground from a couch, without recollection of the event.  He was reportedly complaining of shortness of breath prior to the episode.  History is limited due to continued confusion.  EMS reported O2 sat in the mid 80s on the arrival with improvement to the mid 90s on 4 L. ED Course: On arrival, afebrile, BP 132/89, pulse 100, O2 sat 97% on 4 L..  Blood work remarkable for leukocytosis of 18,000, hemoglobin 11.5.  Elevated D-dimer of 1.4, creatinine 1.32 up from baseline of 1.06.  BNP 182.  Procalcitonin less than 0.1 and lactic acid 1.9.  Venous blood gas with PCO2 of 65.  EtOH less than 10.  UDS pending EKG as reviewed by me : Sinus tach at 101 with nonspecific ST-T wave changes Imaging: CTA chest showed no evidence of PE shows moderate to severe emphysema.  Similar appearance of lung nodules CT head, C-spine with no acute fracture or dislocation  Patient found to be wheezing.  Was treated with duo nebs and Solu-Medrol and administered antibiotics.  Hospitalist consulted for admission.  Review of Systems: As per HPI otherwise all other systems on review of systems negative. ***   Past Medical History:  Diagnosis Date  . Anemia   . Anxiety   . Arthritis   . Asthma   . Cancer ([place])    Basal Cell Skin Cancer  . Chronic back pain   . COPD (chronic obstructive pulmonary disease) ([place])   . Depression   . Diabetes mellitus ([place])   . Dyspnea   . GERD (gastroesophageal reflux disease)   . Gout    . Gout   . Headache   . History of blood clots    Left Leg--July 2018  . History of kidney stones   . Hyperlipidemia   . Hyperlipidemia   . Hypertension   . Kidney stones   . Neuropathy   . On home oxygen therapy    2 L / M  . Pneumonia 06/2017  . Sleep apnea   . Ulcer of foot ([place])    Right    Past Surgical History:  Procedure Laterality Date  . APPENDECTOMY    . DG FEET 2 VIEWS BILAT    . LIPOMA EXCISION Right 08/15/2017   Procedure: EXCISION TUMOR(CYST) FOOT;  Surgeon: Albertine Patricia, DPM;  Location: [facility] ORS;  Service: Podiatry;  Laterality: Right;  . OTHER SURGICAL HISTORY Bilateral Foot surgery     reports that he has quit smoking. His smoking use included cigarettes. He started smoking about 4 years ago. He smoked 0.50 packs per day. He has never used smokeless tobacco. He reports current alcohol use of about 1.0 standard drink of alcohol per week. He reports that he does not use drugs.  Allergies  Allergen Reactions  . Bee Venom Anaphylaxis  . Hydrocodone-Acetaminophen Itching    Family History  Problem Relation Age of Onset  . Hypertension Mother    ***  Prior to Admission medications   Medication Sig Start Date End Date Taking? Authorizing Provider  albuterol (VENTOLIN HFA) 108 (90 Base) MCG/ACT inhaler Inhale 2 puffs into the lungs every 6 (six) hours as needed for wheezing or shortness of breath.    [provider]  alendronate (FOSAMAX) 70 MG tablet Take 70 mg by mouth once a week.  06/23/17   [provider]  allopurinol (ZYLOPRIM) 300 MG tablet Take 600 mg by mouth 2 (two) times daily. 02/24/20   [provider]  aspirin EC 81 MG tablet Take 1 tablet by mouth daily.    [provider]  buprenorphine-naloxone (SUBOXONE) 8-2 mg SUBL SL tablet Place 1 tablet under the tongue 3 (three) times daily.    [provider]  cholecalciferol (VITAMIN D) 1000 units tablet Take 1,000 Units by mouth daily.    [provider]  clopidogrel (PLAVIX) 75 MG tablet Take 75 mg by mouth daily.  02/24/20   [provider]  DULoxetine (CYMBALTA) 30 MG capsule Take 1 capsule by mouth daily. 01/17/16   [provider]  ezetimibe (ZETIA) 10 MG tablet Take 10 mg by mouth daily. 02/24/20   [provider]  FARXIGA 10 MG TABS tablet Take 10 mg by mouth daily. 09/14/20   [provider]  ferrous sulfate 325 (65 FE) MG tablet Take 325 mg by mouth daily with breakfast.    [provider]  fluticasone (FLONASE) 50 MCG/ACT nasal spray Place 2 sprays into both nostrils daily. 12/18/15   [provider]  gabapentin (NEURONTIN) 800 MG tablet Take 800 mg by mouth 4 (four) times daily. 12/18/15   [provider]  hydrochlorothiazide (HYDRODIURIL) 12.5 MG tablet Take 1 tablet (12.5 mg total) by mouth daily. 11/18/20   Nolberto Hanlon, MD  ipratropium (ATROVENT) 0.02 % nebulizer solution Inhale 2.5 mL (500 mcg total) by nebulization every 6 (six) hours. 12/16/16 01/15/19  [provider]  KOMBIGLYZE XR 2.04-999 MG TB24 Take 1 tablet by mouth 2 (two) times daily. 01/17/16   [provider]  metoprolol succinate (TOPROL-XL) 50 MG 24 hr tablet Take 50 mg by mouth daily. 02/24/20   [provider]  ondansetron (ZOFRAN) 4 MG tablet Take 4 mg by mouth every 8 (eight) hours as needed. 10/25/20   [provider]  OXYGEN Inhale 3 L into the lungs.    [provider]  pantoprazole (PROTONIX) 40 MG tablet Take 40 mg by mouth daily. 12/18/15   [provider]  simvastatin (ZOCOR) 40 MG tablet Take 1 tablet by mouth daily at 6 PM. 01/17/16   [provider]  TRELEGY ELLIPTA 100-62.5-25 MCG/INH AEPB Inhale 1 puff into the lungs daily. 12/31/19   [provider]  ZUBSOLV 8.6-2.1 MG SUBL Place 1 tablet under the tongue in the morning, at noon, and at bedtime. 10/03/20   [provider]    Physical Exam: Vitals:   01/29/21  1937 01/29/21 1938  BP: 132/89   Pulse: 100   SpO2: 97%   Weight:  70.3 kg  Height:  5\' 5"  (1.651 m)     Vitals:   01/29/21 1937 01/29/21 1938  BP: 132/89   Pulse: 100   SpO2: 97%   Weight:  70.3 kg  Height:  5\' 5"  (1.651 m)      Constitutional: Alert*** and oriented x 3*** . Not in any apparent distress*** HEENT:      Head: Normocephalic and atraumatic.  Eyes: PERLA, EOMI, Conjunctivae are normal. Sclera is non-icteric.       Mouth/Throat: Mucous membranes are moist.       Neck: Supple with no signs of meningismus. Cardiovascular: Regular rate and rhythm***. No murmurs, gallops, or rubs. 2+ symmetrical distal pulses are present . No JVD. No ***LE edema Respiratory: Respiratory effort normal*** .Lungs sounds clear*** bilaterally. No*** wheezes, crackles, or rhonchi.  Gastrointestinal: Soft, non tender***, and non distended with positive bowel sounds.  Genitourinary: No CVA tenderness. Musculoskeletal: Nontender with normal range of motion in all extremities***. No cyanosis, or erythema of extremities. Neurologic:  Face is symmetric. Moving all extremities. No gross focal neurologic deficits ***. Skin: Skin is warm, dry.  No rash or ulcers*** Psychiatric: Mood and affect are normal***    Labs on Admission: I have personally reviewed following labs and imaging studies  CBC: Recent Labs  Lab 01/29/21 1946  WBC 18.8*  NEUTROABS 16.2*  HGB 11.5*  HCT 37.3*  MCV 94.2  PLT 678   Basic Metabolic Panel: Recent Labs  Lab 01/29/21 1946  NA 133*  K 4.4  CL 94*  CO2 29  GLUCOSE 107*  BUN 19  CREATININE 1.32*  CALCIUM 8.9   GFR: Estimated Creatinine Clearance: 47.9 mL/min (A) (by C-G formula based on SCr of 1.32 mg/dL (H)). Liver Function Tests: Recent Labs  Lab 01/29/21 1946  AST 22  ALT 8  ALKPHOS 90  BILITOT 0.6  PROT 7.5  ALBUMIN 3.5   No results for input(s): LIPASE, AMYLASE in the last 168 hours. No results for input(s): AMMONIA in the last  168 hours. Coagulation Profile: No results for input(s): INR, PROTIME in the last 168 hours. Cardiac Enzymes: No results for input(s): CKTOTAL, CKMB, CKMBINDEX, TROPONINI in the last 168 hours. BNP (last 3 results) No results for input(s): PROBNP in the last 8760 hours. HbA1C: No results for input(s): HGBA1C in the last 72 hours. CBG: No results for input(s): GLUCAP in the last 168 hours. Lipid Profile: No results for input(s): CHOL, HDL, LDLCALC, TRIG, CHOLHDL, LDLDIRECT in the last 72 hours. Thyroid Function Tests: No results for input(s): TSH, T4TOTAL, FREET4, T3FREE, THYROIDAB in the last 72 hours. Anemia Panel: No results for input(s): VITAMINB12, FOLATE, FERRITIN, TIBC, IRON, RETICCTPCT in the last 72 hours. Urine analysis:    Component Value Date/Time   COLORURINE YELLOW (A) 11/13/2020 0616   APPEARANCEUR HAZY (A) 11/13/2020 0616   APPEARANCEUR Clear 03/24/2013 1853   LABSPEC 1.011 11/13/2020 0616   LABSPEC 1.020 03/24/2013 1853   PHURINE 5.0 11/13/2020 0616   GLUCOSEU >=500 (A) 11/13/2020 0616   GLUCOSEU Negative 03/24/2013 1853   HGBUR NEGATIVE 11/13/2020 0616   BILIRUBINUR NEGATIVE 11/13/2020 0616   BILIRUBINUR Negative 03/24/2013 1853   KETONESUR NEGATIVE 11/13/2020 0616   PROTEINUR 30 (A) 11/13/2020 0616   NITRITE NEGATIVE 11/13/2020 0616   LEUKOCYTESUR NEGATIVE 11/13/2020 0616   LEUKOCYTESUR Negative 03/24/2013 1853    Radiological Exams on Admission: DG Chest 1 View  Result Date: 01/29/2021 CLINICAL DATA:  Unwitnessed fall. EXAM: CHEST  1 VIEW COMPARISON:  November 16, 2020 FINDINGS: Chronic appearing increased lung markings are seen without evidence of acute infiltrate, pleural effusion or pneumothorax. The heart size and mediastinal contours are within normal limits. Mild to moderate severity calcification of the aortic arch is noted. Chronic sixth, seventh, eighth and ninth left rib fractures are seen. IMPRESSION: 1. No evidence of acute or active  cardiopulmonary disease. 2. Multiple chronic left-sided rib fractures. Electronically Signed  By: Virgina Norfolk M.D.   On: 01/29/2021 20:35   CT Head Wo Contrast  Result Date: 01/29/2021 CLINICAL DATA:  Unwitnessed fall. EXAM: CT HEAD WITHOUT CONTRAST TECHNIQUE: Contiguous axial images were obtained from the base of the skull through the vertex without intravenous contrast. COMPARISON:  March 25, 2020 FINDINGS: Brain: There is mild to moderate severity cerebral atrophy with widening of the extra-axial spaces and ventricular dilatation. There are areas of decreased attenuation within the white matter tracts of the supratentorial brain, consistent with microvascular disease changes. Small chronic bilateral basal ganglia lacunar infarcts are seen. Vascular: No hyperdense vessel or unexpected calcification. Skull: Normal. Negative for fracture or focal lesion. Sinuses/Orbits: Mild bilateral anterior ethmoid sinus mucosal thickening is seen. Other: None. IMPRESSION: 1. Mild to moderate severity cerebral atrophy. 2. Small chronic bilateral basal ganglia lacunar infarcts. 3. Mild bilateral anterior ethmoid sinus disease. Electronically Signed   By: Virgina Norfolk M.D.   On: 01/29/2021 20:28   CT Angio Chest PE W and/or Wo Contrast  Result Date: 01/29/2021 CLINICAL DATA:  65 year old male with concern for pulmonary embolism. EXAM: CT ANGIOGRAPHY CHEST WITH CONTRAST TECHNIQUE: Multidetector CT imaging of the chest was performed using the standard protocol during bolus administration of intravenous contrast. Multiplanar CT image reconstructions and MIPs were obtained to evaluate the vascular anatomy. CONTRAST:  29mL OMNIPAQUE IOHEXOL 350 MG/ML SOLN COMPARISON:  Chest CT dated 11/20/2020 and radiograph dated 01/29/2021. FINDINGS: Cardiovascular: There is no cardiomegaly or pericardial effusion. Mild atherosclerotic calcification of the thoracic aorta. No aneurysmal dilatation or dissection. The origins of the  great vessels of the aortic arch appear patent as visualized. Evaluation of the pulmonary arteries is limited due to respiratory motion artifact. No pulmonary artery embolus identified. Mediastinum/Nodes: Mildly enlarged bilateral hilar lymph nodes measuring up to 18 mm in the right hilum. Subcarinal lymph node measures 17 mm. The esophagus is grossly unremarkable. No mediastinal fluid collection. Lungs/Pleura: Moderate to severe emphysema. There are bibasilar subpleural atelectasis/scarring. Similar appearance of a 10 mm left upper lobe subpleural nodule (30/6) or possible scarring. A 16 mm subpleural nodule or scarring in the superior segment of the left lower lobe is similar to prior CT. Attention on follow-up imaging recommended. No consolidative changes. There is no pleural effusion or pneumothorax. The central airways are patent. Upper Abdomen: Fatty liver. Musculoskeletal: Degenerative changes of the spine. No acute osseous pathology. Old healed bilateral rib fractures. Review of the MIP images confirms the above findings. IMPRESSION: 1. No acute intrathoracic pathology. No CT evidence of pulmonary artery embolus. 2. Moderate to severe emphysema with bibasilar subpleural atelectasis/scarring. 3. Similar appearance of the left lung nodules as described above. Follow-up with CT in 6 months recommended. 4. Aortic Atherosclerosis (ICD10-I70.0) and Emphysema (ICD10-J43.9). Electronically Signed   By: Anner Crete M.D.   On: 01/29/2021 22:34   CT Cervical Spine Wo Contrast  Result Date: 01/29/2021 CLINICAL DATA:  Unwitnessed fall. EXAM: CT CERVICAL SPINE WITHOUT CONTRAST TECHNIQUE: Multidetector CT imaging of the cervical spine was performed without intravenous contrast. Multiplanar CT image reconstructions were also generated. COMPARISON:  January 04, 2006 FINDINGS: Alignment: Normal. Skull base and vertebrae: No acute fracture. No primary bone lesion or focal pathologic process. Soft tissues and spinal  canal: No prevertebral fluid or swelling. No visible canal hematoma. Disc levels: Moderate to marked severity endplate sclerosis and anterior osteophyte formation is seen at the levels of C4-C5, C5-C6 and C6-C7. Mild endplate sclerosis is seen at the level of C3-C4. Moderate severity intervertebral disc  space narrowing is seen at the level of C4-C5 with moderate to marked severity intervertebral disc space narrowing noted at the levels of C5-C6 and C6-C7. Mild, bilateral multilevel facet joint hypertrophy is noted. Upper chest: Negative. Other: None. IMPRESSION: 1. Moderate to marked severity multilevel degenerative changes, most prominent at the levels of C4-C5, C5-C6 and C6-C7. 2. No evidence of an acute fracture or subluxation. Electronically Signed   By: Virgina Norfolk M.D.   On: 01/29/2021 20:32     Assessment/Plan 65 year old male with history of COPD on 2 L home O2,  NSCLC LUL s/p XRT, anemia, DM2, HTN, OSA, chronic back pain on Suboxone, who was brought in by EMS after he was seen to slump to the ground from a couch, with continued confusion following the event.  EMS reported O2 sat in the mid 80s on the arrival with improvement to the mid 90s on 4 L.   Acute metabolic encephalopathy -Patient with altered mental status and confusion outside of baseline and a possible presyncopal episode -Possibly related to respiratory failure from COPD as well as chronic narcotic therapy -Narcan was not trialed -Fall and aspiration precautions -Neurologic checks -Treat potential etiologies as outlined below   Acute on chronic respiratory failure with hypoxia and hypercapnia (HCC) -Could be multifactorial related to COPD exacerbation as well as home narcotics -CTA chest negative for PE and patient Covid negative -EMS recorded O2 sats in the mid 80s on home flow rate at 2 L improving to mid 90s on 4 L and patient showed increased work of breathing on his arrival -Supplemental O2 to keep sats 90-92     COPD with acute exacerbation ([place]) -Wheezing on arrival -Scheduled and as needed bronchodilator treatment -IV steroids  Leukocytosis -WBC 18,000 but procalcitonin less than 0.1 so not suspecting acute infection/bacterial infection at this time -Continue to monitor    NSCLC of left lung ([place]) -CT with stable pulmonary nodules -Follows with pulmonology.  Status post XRT    Hypertension -   Diabetes mellitus ([place])   Atherosclerosis of native arteries of extremity with intermittent claudication (HCC)   AAA (abdominal aortic aneurysm) without rupture ([place])   Chronically on opiate therapy   Acute on chronic respiratory failure with hypoxia (HCC)    DVT prophylaxis: Lovenox***  Code Status: full code***  Family Communication:  none***  Disposition Plan: Back to previous home environment Consults called: none***  Status:***    Athena Masse MD [facility]     01/29/2021, 11:51 PM

## 2021-01-29 NOTE — H&P (Signed)
History and Physical    Christian Fields OFB:[id number] DOB: 06-28-1956 DOA: 01/29/2021  PCP: Remi Haggard, FNP   Patient coming from: home  I have personally briefly reviewed patient's old medical records in [place]  Chief Complaint: altered mental status  HPI: Christian Fields is a 65 y.o. male with medical history significant for COPD on 2 L home O2,  NSCLC LUL s/p XRT, anemia, DM2, HTN, OSA, chronic back pain on Suboxone, who was brought in by EMS after he was seen to slump to the ground from a couch, without recollection of the event.  He was reportedly complaining of shortness of breath prior to the episode.  History is limited due to continued confusion.  EMS reported O2 sat in the mid 80s on the arrival with improvement to the mid 90s on 4 L. ED Course: On arrival, afebrile, BP 132/89, pulse 100, O2 sat 97% on 4 L..  Blood work remarkable for leukocytosis of 18,000, hemoglobin 11.5.  Elevated D-dimer of 1.4, creatinine 1.32 up from baseline of 1.06.  BNP 182.  Procalcitonin less than 0.1 and lactic acid 1.9.  Venous blood gas with PCO2 of 65.  EtOH less than 10.  UDS pending EKG as reviewed by me : Sinus tach at 101 with nonspecific ST-T wave changes Imaging: CTA chest showed no evidence of PE shows moderate to severe emphysema.  Similar appearance of lung nodules CT head, C-spine with no acute fracture or dislocation  Patient found to be wheezing.  Was treated with duo nebs and Solu-Medrol and administered antibiotics.  Hospitalist consulted for admission.  Review of Systems: Limited due to altered mental status   Past Medical History:  Diagnosis Date  . Anemia   . Anxiety   . Arthritis   . Asthma   . Cancer ([place])    Basal Cell Skin Cancer  . Chronic back pain   . COPD (chronic obstructive pulmonary disease) ([place])   . Depression   . Diabetes mellitus ([place])   . Dyspnea   . GERD (gastroesophageal reflux disease)   . Gout   . Gout   . Headache   . History of  blood clots    Left Leg--July 2018  . History of kidney stones   . Hyperlipidemia   . Hyperlipidemia   . Hypertension   . Kidney stones   . Neuropathy   . On home oxygen therapy    2 L / M  . Pneumonia 06/2017  . Sleep apnea   . Ulcer of foot ([place])    Right    Past Surgical History:  Procedure Laterality Date  . APPENDECTOMY    . DG FEET 2 VIEWS BILAT    . LIPOMA EXCISION Right 08/15/2017   Procedure: EXCISION TUMOR(CYST) FOOT;  Surgeon: Albertine Patricia, DPM;  Location: [facility] ORS;  Service: Podiatry;  Laterality: Right;  . OTHER SURGICAL HISTORY Bilateral Foot surgery     reports that he has quit smoking. His smoking use included cigarettes. He started smoking about 4 years ago. He smoked 0.50 packs per day. He has never used smokeless tobacco. He reports current alcohol use of about 1.0 standard drink of alcohol per week. He reports that he does not use drugs.  Allergies  Allergen Reactions  . Bee Venom Anaphylaxis  . Hydrocodone-Acetaminophen Itching    Family History  Problem Relation Age of Onset  . Hypertension Mother       Prior to Admission medications   Medication  Sig Start Date End Date Taking? Authorizing Provider  albuterol (VENTOLIN HFA) 108 (90 Base) MCG/ACT inhaler Inhale 2 puffs into the lungs every 6 (six) hours as needed for wheezing or shortness of breath.    [provider]  alendronate (FOSAMAX) 70 MG tablet Take 70 mg by mouth once a week.  06/23/17   [provider]  allopurinol (ZYLOPRIM) 300 MG tablet Take 600 mg by mouth 2 (two) times daily. 02/24/20   [provider]  aspirin EC 81 MG tablet Take 1 tablet by mouth daily.    [provider]  buprenorphine-naloxone (SUBOXONE) 8-2 mg SUBL SL tablet Place 1 tablet under the tongue 3 (three) times daily.    [provider]  cholecalciferol (VITAMIN D) 1000 units tablet Take 1,000 Units by mouth daily.    [provider]  clopidogrel (PLAVIX) 75 MG  tablet Take 75 mg by mouth daily.  02/24/20   [provider]  DULoxetine (CYMBALTA) 30 MG capsule Take 1 capsule by mouth daily. 01/17/16   [provider]  ezetimibe (ZETIA) 10 MG tablet Take 10 mg by mouth daily. 02/24/20   [provider]  FARXIGA 10 MG TABS tablet Take 10 mg by mouth daily. 09/14/20   [provider]  ferrous sulfate 325 (65 FE) MG tablet Take 325 mg by mouth daily with breakfast.    [provider]  fluticasone (FLONASE) 50 MCG/ACT nasal spray Place 2 sprays into both nostrils daily. 12/18/15   [provider]  gabapentin (NEURONTIN) 800 MG tablet Take 800 mg by mouth 4 (four) times daily. 12/18/15   [provider]  hydrochlorothiazide (HYDRODIURIL) 12.5 MG tablet Take 1 tablet (12.5 mg total) by mouth daily. 11/18/20   Nolberto Hanlon, MD  ipratropium (ATROVENT) 0.02 % nebulizer solution Inhale 2.5 mL (500 mcg total) by nebulization every 6 (six) hours. 12/16/16 01/15/19  [provider]  KOMBIGLYZE XR 2.04-999 MG TB24 Take 1 tablet by mouth 2 (two) times daily. 01/17/16   [provider]  metoprolol succinate (TOPROL-XL) 50 MG 24 hr tablet Take 50 mg by mouth daily. 02/24/20   [provider]  ondansetron (ZOFRAN) 4 MG tablet Take 4 mg by mouth every 8 (eight) hours as needed. 10/25/20   [provider]  OXYGEN Inhale 3 L into the lungs.    [provider]  pantoprazole (PROTONIX) 40 MG tablet Take 40 mg by mouth daily. 12/18/15   [provider]  simvastatin (ZOCOR) 40 MG tablet Take 1 tablet by mouth daily at 6 PM. 01/17/16   [provider]  TRELEGY ELLIPTA 100-62.5-25 MCG/INH AEPB Inhale 1 puff into the lungs daily. 12/31/19   [provider]  ZUBSOLV 8.6-2.1 MG SUBL Place 1 tablet under the tongue in the morning, at noon, and at bedtime. 10/03/20   [provider]    Physical Exam: Vitals:   01/29/21 1937 01/29/21 1938  BP: 132/89   Pulse: 100    SpO2: 97%   Weight:  70.3 kg  Height:  5\' 5"  (1.651 m)     Vitals:   01/29/21 1937 01/29/21 1938  BP: 132/89   Pulse: 100   SpO2: 97%   Weight:  70.3 kg  Height:  5\' 5"  (1.651 m)      Constitutional:  Somnolent but arousable and oriented x 3 .  Mild conversational dyspnea HEENT:      Head: Normocephalic and atraumatic.         Eyes: PERLA,  EOMI, Conjunctivae are normal. Sclera is non-icteric.       Mouth/Throat: Mucous membranes are moist.       Neck: Supple with no signs of meningismus. Cardiovascular:  Tachycardic. No murmurs, gallops, or rubs. 2+ symmetrical distal pulses are present . No JVD. No LE edema Respiratory: Respiratory effort increased.Lungs sounds diminished bilaterally. No wheezes, crackles, or rhonchi.  Speaking in short sentences Gastrointestinal: Soft, non tender, and non distended with positive bowel sounds.  Genitourinary: No CVA tenderness. Musculoskeletal: Nontender with normal range of motion in all extremities. No cyanosis, or erythema of extremities. Neurologic:  Face is symmetric. Moving all extremities. No gross focal neurologic deficits . Skin: Skin is warm, dry.  Multiple shallow excoriations ulcers on extremities Psychiatric: Mood and affect difficult to assess due to somnolence   Labs on Admission: I have personally reviewed following labs and imaging studies  CBC: Recent Labs  Lab 01/29/21 1946  WBC 18.8*  NEUTROABS 16.2*  HGB 11.5*  HCT 37.3*  MCV 94.2  PLT 893   Basic Metabolic Panel: Recent Labs  Lab 01/29/21 1946  NA 133*  K 4.4  CL 94*  CO2 29  GLUCOSE 107*  BUN 19  CREATININE 1.32*  CALCIUM 8.9   GFR: Estimated Creatinine Clearance: 47.9 mL/min (A) (by C-G formula based on SCr of 1.32 mg/dL (H)). Liver Function Tests: Recent Labs  Lab 01/29/21 1946  AST 22  ALT 8  ALKPHOS 90  BILITOT 0.6  PROT 7.5  ALBUMIN 3.5   No results for input(s): LIPASE, AMYLASE in the last 168 hours. No results for input(s):  AMMONIA in the last 168 hours. Coagulation Profile: No results for input(s): INR, PROTIME in the last 168 hours. Cardiac Enzymes: No results for input(s): CKTOTAL, CKMB, CKMBINDEX, TROPONINI in the last 168 hours. BNP (last 3 results) No results for input(s): PROBNP in the last 8760 hours. HbA1C: No results for input(s): HGBA1C in the last 72 hours. CBG: No results for input(s): GLUCAP in the last 168 hours. Lipid Profile: No results for input(s): CHOL, HDL, LDLCALC, TRIG, CHOLHDL, LDLDIRECT in the last 72 hours. Thyroid Function Tests: No results for input(s): TSH, T4TOTAL, FREET4, T3FREE, THYROIDAB in the last 72 hours. Anemia Panel: No results for input(s): VITAMINB12, FOLATE, FERRITIN, TIBC, IRON, RETICCTPCT in the last 72 hours. Urine analysis:    Component Value Date/Time   COLORURINE YELLOW (A) 11/13/2020 0616   APPEARANCEUR HAZY (A) 11/13/2020 0616   APPEARANCEUR Clear 03/24/2013 1853   LABSPEC 1.011 11/13/2020 0616   LABSPEC 1.020 03/24/2013 1853   PHURINE 5.0 11/13/2020 0616   GLUCOSEU >=500 (A) 11/13/2020 0616   GLUCOSEU Negative 03/24/2013 1853   HGBUR NEGATIVE 11/13/2020 0616   BILIRUBINUR NEGATIVE 11/13/2020 0616   BILIRUBINUR Negative 03/24/2013 1853   KETONESUR NEGATIVE 11/13/2020 0616   PROTEINUR 30 (A) 11/13/2020 0616   NITRITE NEGATIVE 11/13/2020 0616   LEUKOCYTESUR NEGATIVE 11/13/2020 0616   LEUKOCYTESUR Negative 03/24/2013 1853    Radiological Exams on Admission: DG Chest 1 View  Result Date: 01/29/2021 CLINICAL DATA:  Unwitnessed fall. EXAM: CHEST  1 VIEW COMPARISON:  November 16, 2020 FINDINGS: Chronic appearing increased lung markings are seen without evidence of acute infiltrate, pleural effusion or pneumothorax. The heart size and mediastinal contours are within normal limits. Mild to moderate severity calcification of the aortic arch is noted. Chronic sixth, seventh, eighth and ninth left rib fractures are seen. IMPRESSION: 1. No evidence of acute or  active cardiopulmonary disease. 2. Multiple chronic left-sided rib fractures.  Electronically Signed   By: Virgina Norfolk M.D.   On: 01/29/2021 20:35   CT Head Wo Contrast  Result Date: 01/29/2021 CLINICAL DATA:  Unwitnessed fall. EXAM: CT HEAD WITHOUT CONTRAST TECHNIQUE: Contiguous axial images were obtained from the base of the skull through the vertex without intravenous contrast. COMPARISON:  March 25, 2020 FINDINGS: Brain: There is mild to moderate severity cerebral atrophy with widening of the extra-axial spaces and ventricular dilatation. There are areas of decreased attenuation within the white matter tracts of the supratentorial brain, consistent with microvascular disease changes. Small chronic bilateral basal ganglia lacunar infarcts are seen. Vascular: No hyperdense vessel or unexpected calcification. Skull: Normal. Negative for fracture or focal lesion. Sinuses/Orbits: Mild bilateral anterior ethmoid sinus mucosal thickening is seen. Other: None. IMPRESSION: 1. Mild to moderate severity cerebral atrophy. 2. Small chronic bilateral basal ganglia lacunar infarcts. 3. Mild bilateral anterior ethmoid sinus disease. Electronically Signed   By: Virgina Norfolk M.D.   On: 01/29/2021 20:28   CT Angio Chest PE W and/or Wo Contrast  Result Date: 01/29/2021 CLINICAL DATA:  65 year old male with concern for pulmonary embolism. EXAM: CT ANGIOGRAPHY CHEST WITH CONTRAST TECHNIQUE: Multidetector CT imaging of the chest was performed using the standard protocol during bolus administration of intravenous contrast. Multiplanar CT image reconstructions and MIPs were obtained to evaluate the vascular anatomy. CONTRAST:  45mL OMNIPAQUE IOHEXOL 350 MG/ML SOLN COMPARISON:  Chest CT dated 11/20/2020 and radiograph dated 01/29/2021. FINDINGS: Cardiovascular: There is no cardiomegaly or pericardial effusion. Mild atherosclerotic calcification of the thoracic aorta. No aneurysmal dilatation or dissection. The origins  of the great vessels of the aortic arch appear patent as visualized. Evaluation of the pulmonary arteries is limited due to respiratory motion artifact. No pulmonary artery embolus identified. Mediastinum/Nodes: Mildly enlarged bilateral hilar lymph nodes measuring up to 18 mm in the right hilum. Subcarinal lymph node measures 17 mm. The esophagus is grossly unremarkable. No mediastinal fluid collection. Lungs/Pleura: Moderate to severe emphysema. There are bibasilar subpleural atelectasis/scarring. Similar appearance of a 10 mm left upper lobe subpleural nodule (30/6) or possible scarring. A 16 mm subpleural nodule or scarring in the superior segment of the left lower lobe is similar to prior CT. Attention on follow-up imaging recommended. No consolidative changes. There is no pleural effusion or pneumothorax. The central airways are patent. Upper Abdomen: Fatty liver. Musculoskeletal: Degenerative changes of the spine. No acute osseous pathology. Old healed bilateral rib fractures. Review of the MIP images confirms the above findings. IMPRESSION: 1. No acute intrathoracic pathology. No CT evidence of pulmonary artery embolus. 2. Moderate to severe emphysema with bibasilar subpleural atelectasis/scarring. 3. Similar appearance of the left lung nodules as described above. Follow-up with CT in 6 months recommended. 4. Aortic Atherosclerosis (ICD10-I70.0) and Emphysema (ICD10-J43.9). Electronically Signed   By: Anner Crete M.D.   On: 01/29/2021 22:34   CT Cervical Spine Wo Contrast  Result Date: 01/29/2021 CLINICAL DATA:  Unwitnessed fall. EXAM: CT CERVICAL SPINE WITHOUT CONTRAST TECHNIQUE: Multidetector CT imaging of the cervical spine was performed without intravenous contrast. Multiplanar CT image reconstructions were also generated. COMPARISON:  January 04, 2006 FINDINGS: Alignment: Normal. Skull base and vertebrae: No acute fracture. No primary bone lesion or focal pathologic process. Soft tissues and  spinal canal: No prevertebral fluid or swelling. No visible canal hematoma. Disc levels: Moderate to marked severity endplate sclerosis and anterior osteophyte formation is seen at the levels of C4-C5, C5-C6 and C6-C7. Mild endplate sclerosis is seen at the level of C3-C4.  Moderate severity intervertebral disc space narrowing is seen at the level of C4-C5 with moderate to marked severity intervertebral disc space narrowing noted at the levels of C5-C6 and C6-C7. Mild, bilateral multilevel facet joint hypertrophy is noted. Upper chest: Negative. Other: None. IMPRESSION: 1. Moderate to marked severity multilevel degenerative changes, most prominent at the levels of C4-C5, C5-C6 and C6-C7. 2. No evidence of an acute fracture or subluxation. Electronically Signed   By: Virgina Norfolk M.D.   On: 01/29/2021 20:32     Assessment/Plan 65 year old male with history of COPD on 2 L home O2,  NSCLC LUL s/p XRT, anemia, DM2, HTN, OSA, chronic back pain on Suboxone, who was brought in by EMS after he was seen to slump to the ground from a couch, with continued confusion following the event.  EMS reported O2 sat in the mid 80s on the arrival with improvement to the mid 90s on 4 L.   Acute metabolic encephalopathy Polysubstance dependence -Patient with altered mental status and confusion outside of baseline and a possible presyncopal episode -Suspect mostly related to polysubstance dependence including Suboxone -UDS returned after admission positive for benzos, opiates and amphetamines -Fall and aspiration precautions -Neurologic checks -Treat potential etiologies as outlined below   Acute on chronic respiratory failure with hypoxia and hypercapnia (HCC) -Suspect related to polysubstance dependence and respiratory depression: Opiates amphetamines and benzos -CTA chest negative for PE and patient Covid negative -EMS recorded O2 sats in the mid 80s on home flow rate at 2 L improving to mid 90s on 4 L and patient  showed increased work of breathing on his arrival -Supplemental O2 to keep sats 90-92    COPD  -Treated as COPD in the emergency room but no wheezing by the time of admission -Scheduled and as needed bronchodilator treatment -Hold off on IV steroids for now given leukocytosis and not strongly convinced about COPD exacerbation  Leukocytosis -WBC 18,000 but procalcitonin less than 0.1 so not suspecting acute infection/bacterial infection at this time -Continue to monitor -We will hold off on antibiotics as well    NSCLC of left lung ([place]) -CT with stable pulmonary nodules -Follows with pulmonology.  Status post XRT    Hypertension -Continue hydrochlorothiazide    Diabetes mellitus (HCC) -Sliding scale insulin coverage    Atherosclerosis of native arteries of extremity with intermittent claudication (HCC) -Continue aspirin, clopidogrel and ezetimibe and simvastatin    Chronically on opiate therapy -Suboxone seen on med list. -Will hold while altered and resume as appropriate    DVT prophylaxis: Lovenox  Code Status: full code  Family Communication:  none  Disposition Plan: Back to previous home environment Consults called: none  Status:obs    Athena Masse MD [facility]     01/29/2021, 11:51 PM

## 2021-01-29 NOTE — ED Notes (Signed)
Late entry-- Pt ambulated in room with assistance of attending Dr Creig Hines and this nurse -- pt able to ambulate 20 feet with min assist however O2 desat to high 70s on 2L O2 via [place] -- O2 increased to 3L with improvement to 96%.

## 2021-01-29 NOTE — ED Notes (Signed)
Pt refusing in and out cah -- attempting to void at this time

## 2021-01-29 NOTE — ED Provider Notes (Signed)
[facility] Emergency Department Provider Note  ____________________________________________   Event Date/Time   First MD Initiated Contact with Patient 01/29/21 1937     (approximate)  I have reviewed the triage vital signs and the nursing notes.   HISTORY  Chief Complaint Fall, Weakness, and Altered Mental Status   HPI Christian Fields is a 65 y.o. male with PMH of COPD complicated by chronic hypoxic resp failure on 2L [place] at baseline, recent diagnosis of NSCLC LUL s/p XRT, anemia, DM2, HTN, HDL, kidney stones, gout, GERD and OSA presents via EMS from home for assessment after reportedly was complaining of some shortness of breath and had a fall.  Is unclear exactly what happened but patient reportedly slid down the couch.  Patient does not recall this.  He is quite confused on arrival and unable provide any additional history.  He denies any acute pain or shortness of breath.  He is not sure why anyone called EMS.         Past Medical History:  Diagnosis Date  . Anemia   . Anxiety   . Arthritis   . Asthma   . Cancer ([place])    Basal Cell Skin Cancer  . Chronic back pain   . COPD (chronic obstructive pulmonary disease) ([place])   . Depression   . Diabetes mellitus ([place])   . Dyspnea   . GERD (gastroesophageal reflux disease)   . Gout   . Gout   . Headache   . History of blood clots    Left Leg--July 2018  . History of kidney stones   . Hyperlipidemia   . Hyperlipidemia   . Hypertension   . Kidney stones   . Neuropathy   . On home oxygen therapy    2 L / M  . Pneumonia 06/2017  . Sleep apnea   . Ulcer of foot [facility])    Right    Patient Active Problem List   Diagnosis Date Noted  . Acute metabolic encephalopathy 91/69/4503  . COPD with acute exacerbation ([place]) 01/29/2021  . Iron deficiency anemia 11/27/2020  . Severe sepsis ([place])   . NSCLC of left lung ([place]) 11/12/2020  . Left leg swelling 04/28/2020  . Confusion   . Acute on chronic  respiratory failure with hypoxia ([place]) 03/26/2020  . Pneumonia of both lower lobes due to infectious organism 03/25/2020  . AKI (acute kidney injury) ([place]) 01/13/2020  . Cellulitis of left leg 01/12/2020  . COVID-19 virus detected 01/12/2020  . Diabetic ulcer of left midfoot associated with type 2 diabetes mellitus, with fat layer exposed ([place]) 01/12/2020  . Panlobular emphysema ([place]) 01/12/2020  . Arthritis 10/29/2019  . Hemorrhoids 10/29/2019  . Senile nuclear sclerosis, bilateral 02/15/2019  . Cellulitis of left upper limb 02/11/2019  . AAA (abdominal aortic aneurysm) without rupture ([place]) 01/15/2019  . Localized swelling, mass and lump, upper limb 12/24/2018  . Pain in femur 12/24/2018  . TIA (transient ischemic attack) 11/18/2018  . Cortical age-related cataract of both eyes 11/02/2018  . Fracture of multiple ribs 04/20/2018  . Overweight (BMI 25.0-29.9) 04/20/2018  . Hypertension 07/08/2017  . Diabetes mellitus ([place]) 07/08/2017  . Hyperlipidemia 07/08/2017  . Tobacco use disorder 07/08/2017  . Atherosclerosis of native arteries of extremity with intermittent claudication ([place]) 07/08/2017  . SOB (shortness of breath) 06/19/2017  . Acute on chronic respiratory failure with hypoxia and hypercapnia ([place]) 12/15/2016  . CAP (community acquired pneumonia) 12/15/2016  . Chronic pain 12/15/2016  .  Chronically on opiate therapy 12/15/2016  . History of kidney stones 12/15/2016  . Polypharmacy 12/15/2016  . Somnolence 12/15/2016  . Foot ulcer ([place]) 01/19/2016  . 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 Other psychoactive substance use, unspecified with withdrawal, uncomplicated ([place]) 20/94/7096  . Type 2 diabetes mellitus with foot ulcer (CODE) ([place]) 11/27/2015  . Chronic obstructive pulmonary disease, unspecified ([place]) 01/18/2013  . Depressive disorder 01/18/2013  . Hereditary and idiopathic peripheral neuropathy 01/18/2013  . Sleep apnea 01/18/2013  . Low  back pain 11/23/2010  . Acute gouty arthropathy 07/10/2010  . Problem related to lifestyle 10/29/2004    Past Surgical History:  Procedure Laterality Date  . APPENDECTOMY    . DG FEET 2 VIEWS BILAT    . LIPOMA EXCISION Right 08/15/2017   Procedure: EXCISION TUMOR(CYST) FOOT;  Surgeon: Albertine Patricia, DPM;  Location: [facility] ORS;  Service: Podiatry;  Laterality: Right;  . OTHER SURGICAL HISTORY Bilateral Foot surgery    Prior to Admission medications   Medication Sig Start Date End Date Taking? Authorizing Provider  albuterol (VENTOLIN HFA) 108 (90 Base) MCG/ACT inhaler Inhale 2 puffs into the lungs every 6 (six) hours as needed for wheezing or shortness of breath.    [provider]  alendronate (FOSAMAX) 70 MG tablet Take 70 mg by mouth once a week.  06/23/17   [provider]  allopurinol (ZYLOPRIM) 300 MG tablet Take 600 mg by mouth 2 (two) times daily. 02/24/20   [provider]  aspirin EC 81 MG tablet Take 1 tablet by mouth daily.    [provider]  buprenorphine-naloxone (SUBOXONE) 8-2 mg SUBL SL tablet Place 1 tablet under the tongue 3 (three) times daily.    [provider]  cholecalciferol (VITAMIN D) 1000 units tablet Take 1,000 Units by mouth daily.    [provider]  clopidogrel (PLAVIX) 75 MG tablet Take 75 mg by mouth daily.  02/24/20   [provider]  DULoxetine (CYMBALTA) 30 MG capsule Take 1 capsule by mouth daily. 01/17/16   [provider]  ezetimibe (ZETIA) 10 MG tablet Take 10 mg by mouth daily. 02/24/20   [provider]  FARXIGA 10 MG TABS tablet Take 10 mg by mouth daily. 09/14/20   [provider]  ferrous sulfate 325 (65 FE) MG tablet Take 325 mg by mouth daily with breakfast.    [provider]  fluticasone (FLONASE) 50 MCG/ACT nasal spray Place 2 sprays into both nostrils daily. 12/18/15   [provider]  gabapentin (NEURONTIN) 800 MG tablet Take 800 mg by mouth  4 (four) times daily. 12/18/15   [provider]  hydrochlorothiazide (HYDRODIURIL) 12.5 MG tablet Take 1 tablet (12.5 mg total) by mouth daily. 11/18/20   Nolberto Hanlon, MD  ipratropium (ATROVENT) 0.02 % nebulizer solution Inhale 2.5 mL (500 mcg total) by nebulization every 6 (six) hours. 12/16/16 01/15/19  [provider]  KOMBIGLYZE XR 2.04-999 MG TB24 Take 1 tablet by mouth 2 (two) times daily. 01/17/16   [provider]  metoprolol succinate (TOPROL-XL) 50 MG 24 hr tablet Take 50 mg by mouth daily. 02/24/20   [provider]  ondansetron (ZOFRAN) 4 MG tablet Take 4 mg by mouth every 8 (eight) hours as needed. 10/25/20   [provider]  OXYGEN Inhale 3 L into the lungs.    [provider]  pantoprazole (PROTONIX) 40 MG tablet Take 40 mg by mouth daily. 12/18/15   [provider]  simvastatin (  ZOCOR) 40 MG tablet Take 1 tablet by mouth daily at 6 PM. 01/17/16   [provider]  TRELEGY ELLIPTA 100-62.5-25 MCG/INH AEPB Inhale 1 puff into the lungs daily. 12/31/19   [provider]  ZUBSOLV 8.6-2.1 MG SUBL Place 1 tablet under the tongue in the morning, at noon, and at bedtime. 10/03/20   [provider]    Allergies Bee venom and Hydrocodone-acetaminophen  Family History  Problem Relation Age of Onset  . Hypertension Mother     Social History Social History   Tobacco Use  . Smoking status: Former Smoker    Packs/day: 0.50    Types: Cigarettes    Start date: 2018  . Smokeless tobacco: Never Used  Vaping Use  . Vaping Use: Never used  Substance Use Topics  . Alcohol use: Yes    Alcohol/week: 1.0 standard drink    Types: 1 Cans of beer per week    Comment: occ  . Drug use: No    Review of Systems  Review of Systems  Unable to perform ROS: Mental status change      ____________________________________________   PHYSICAL EXAM:  VITAL SIGNS: ED Triage Vitals  Enc Vitals Group     BP       Pulse      Resp      Temp      Temp src      SpO2      Weight      Height      Head Circumference      Peak Flow      Pain Score      Pain Loc      Pain Edu?      Excl. in [place]?    Vitals:   01/29/21 1937  BP: 132/89  Pulse: 100  SpO2: 97%   Physical Exam Vitals and nursing note reviewed.  Constitutional:      Appearance: He is well-developed and well-nourished.  HENT:     Head: Normocephalic and atraumatic.     Right Ear: External ear normal.     Left Ear: External ear normal.     Nose: Nose normal.  Eyes:     Conjunctiva/sclera: Conjunctivae normal.  Cardiovascular:     Rate and Rhythm: Normal rate and regular rhythm.     Heart sounds: No murmur heard.   Pulmonary:     Effort: Pulmonary effort is normal. No respiratory distress.     Breath sounds: Wheezing present.  Abdominal:     Palpations: Abdomen is soft.     Tenderness: There is no abdominal tenderness.  Musculoskeletal:     Cervical back: Neck supple.     Right lower leg: No edema.     Left lower leg: Edema present.  Skin:    General: Skin is warm and dry.  Neurological:     General: No focal deficit present.     Mental Status: He is alert.  Psychiatric:        Mood and Affect: Mood and affect normal.     Patient is confused and not oriented.  Cranial nerves II through XII grossly intact.  2+ bilateral radial and DP pulses.  Patient has symmetric strength in his bilateral upper and lower extremities.  He has some significant edema in his left lower leg.  No tenderness to upper deformities over the C/T/L-spine.  No obvious trauma to the face scalp head neck upper extremities. ____________________________________________   LABS (all labs ordered are  listed, but only abnormal results are displayed)  Labs Reviewed  BLOOD GAS, VENOUS - Abnormal; Notable for the following components:      Result Value   pCO2, Ven 65 (*)    pO2, Ven 31.0 (*)    Bicarbonate 34.3 (*)    Acid-Base Excess 6.9 (*)    All  other components within normal limits  CBC WITH DIFFERENTIAL/PLATELET - Abnormal; Notable for the following components:   WBC 18.8 (*)    RBC 3.96 (*)    Hemoglobin 11.5 (*)    HCT 37.3 (*)    RDW 15.8 (*)    Neutro Abs 16.2 (*)    Abs Immature Granulocytes 0.11 (*)    All other components within normal limits  COMPREHENSIVE METABOLIC PANEL - Abnormal; Notable for the following components:   Sodium 133 (*)    Chloride 94 (*)    Glucose, Bld 107 (*)    Creatinine, Ser 1.32 (*)    GFR, Estimated 59 (*)    All other components within normal limits  BRAIN NATRIURETIC PEPTIDE - Abnormal; Notable for the following components:   B Natriuretic Peptide 182.6 (*)    All other components within normal limits  D-DIMER, QUANTITATIVE - Abnormal; Notable for the following components:   D-Dimer, Quant 1.40 (*)    All other components within normal limits  RESP PANEL BY RT-PCR (FLU A&B, COVID) ARPGX2  URINE CULTURE  CULTURE, BLOOD (ROUTINE X 2)  CULTURE, BLOOD (ROUTINE X 2)  PROCALCITONIN  LACTIC ACID, PLASMA  ETHANOL  URINALYSIS, COMPLETE (UACMP) WITH MICROSCOPIC  URINE DRUG SCREEN, QUALITATIVE ([facility] ONLY)  TROPONIN I (HIGH SENSITIVITY)  TROPONIN I (HIGH SENSITIVITY)   ____________________________________________  EKG  Sinus tachycardia with ventricular rate of 101, normal axis, nonspecific ST changes versus artifact in V5 and aVL without other clear evidence of acute ischemia or other significant underlying arrhythmia. ____________________________________________  RADIOLOGY  ED MD interpretation: CT head and C-spine showed no evidence of acute injury.  On CT head patient has evidence of severe cerebral atrophy and small chronic bilateral basal lacunar infarcts.  Chest x-ray shows no focal consolidation, large effusion, overt edema or other clear acute process but there is evidence of chronic multiple left-sided rib fractures.  No acute injury noted.  Official radiology report(s): DG  Chest 1 View  Result Date: 01/29/2021 CLINICAL DATA:  Unwitnessed fall. EXAM: CHEST  1 VIEW COMPARISON:  November 16, 2020 FINDINGS: Chronic appearing increased lung markings are seen without evidence of acute infiltrate, pleural effusion or pneumothorax. The heart size and mediastinal contours are within normal limits. Mild to moderate severity calcification of the aortic arch is noted. Chronic sixth, seventh, eighth and ninth left rib fractures are seen. IMPRESSION: 1. No evidence of acute or active cardiopulmonary disease. 2. Multiple chronic left-sided rib fractures. Electronically Signed   By: Virgina Norfolk M.D.   On: 01/29/2021 20:35   CT Head Wo Contrast  Result Date: 01/29/2021 CLINICAL DATA:  Unwitnessed fall. EXAM: CT HEAD WITHOUT CONTRAST TECHNIQUE: Contiguous axial images were obtained from the base of the skull through the vertex without intravenous contrast. COMPARISON:  March 25, 2020 FINDINGS: Brain: There is mild to moderate severity cerebral atrophy with widening of the extra-axial spaces and ventricular dilatation. There are areas of decreased attenuation within the white matter tracts of the supratentorial brain, consistent with microvascular disease changes. Small chronic bilateral basal ganglia lacunar infarcts are seen. Vascular: No hyperdense vessel or unexpected calcification. Skull: Normal. Negative for fracture or  focal lesion. Sinuses/Orbits: Mild bilateral anterior ethmoid sinus mucosal thickening is seen. Other: None. IMPRESSION: 1. Mild to moderate severity cerebral atrophy. 2. Small chronic bilateral basal ganglia lacunar infarcts. 3. Mild bilateral anterior ethmoid sinus disease. Electronically Signed   By: Virgina Norfolk M.D.   On: 01/29/2021 20:28   CT Angio Chest PE W and/or Wo Contrast  Result Date: 01/29/2021 CLINICAL DATA:  65 year old male with concern for pulmonary embolism. EXAM: CT ANGIOGRAPHY CHEST WITH CONTRAST TECHNIQUE: Multidetector CT imaging of the  chest was performed using the standard protocol during bolus administration of intravenous contrast. Multiplanar CT image reconstructions and MIPs were obtained to evaluate the vascular anatomy. CONTRAST:  43mL OMNIPAQUE IOHEXOL 350 MG/ML SOLN COMPARISON:  Chest CT dated 11/20/2020 and radiograph dated 01/29/2021. FINDINGS: Cardiovascular: There is no cardiomegaly or pericardial effusion. Mild atherosclerotic calcification of the thoracic aorta. No aneurysmal dilatation or dissection. The origins of the great vessels of the aortic arch appear patent as visualized. Evaluation of the pulmonary arteries is limited due to respiratory motion artifact. No pulmonary artery embolus identified. Mediastinum/Nodes: Mildly enlarged bilateral hilar lymph nodes measuring up to 18 mm in the right hilum. Subcarinal lymph node measures 17 mm. The esophagus is grossly unremarkable. No mediastinal fluid collection. Lungs/Pleura: Moderate to severe emphysema. There are bibasilar subpleural atelectasis/scarring. Similar appearance of a 10 mm left upper lobe subpleural nodule (30/6) or possible scarring. A 16 mm subpleural nodule or scarring in the superior segment of the left lower lobe is similar to prior CT. Attention on follow-up imaging recommended. No consolidative changes. There is no pleural effusion or pneumothorax. The central airways are patent. Upper Abdomen: Fatty liver. Musculoskeletal: Degenerative changes of the spine. No acute osseous pathology. Old healed bilateral rib fractures. Review of the MIP images confirms the above findings. IMPRESSION: 1. No acute intrathoracic pathology. No CT evidence of pulmonary artery embolus. 2. Moderate to severe emphysema with bibasilar subpleural atelectasis/scarring. 3. Similar appearance of the left lung nodules as described above. Follow-up with CT in 6 months recommended. 4. Aortic Atherosclerosis (ICD10-I70.0) and Emphysema (ICD10-J43.9). Electronically Signed   By: Anner Crete M.D.   On: 01/29/2021 22:34   CT Cervical Spine Wo Contrast  Result Date: 01/29/2021 CLINICAL DATA:  Unwitnessed fall. EXAM: CT CERVICAL SPINE WITHOUT CONTRAST TECHNIQUE: Multidetector CT imaging of the cervical spine was performed without intravenous contrast. Multiplanar CT image reconstructions were also generated. COMPARISON:  January 04, 2006 FINDINGS: Alignment: Normal. Skull base and vertebrae: No acute fracture. No primary bone lesion or focal pathologic process. Soft tissues and spinal canal: No prevertebral fluid or swelling. No visible canal hematoma. Disc levels: Moderate to marked severity endplate sclerosis and anterior osteophyte formation is seen at the levels of C4-C5, C5-C6 and C6-C7. Mild endplate sclerosis is seen at the level of C3-C4. Moderate severity intervertebral disc space narrowing is seen at the level of C4-C5 with moderate to marked severity intervertebral disc space narrowing noted at the levels of C5-C6 and C6-C7. Mild, bilateral multilevel facet joint hypertrophy is noted. Upper chest: Negative. Other: None. IMPRESSION: 1. Moderate to marked severity multilevel degenerative changes, most prominent at the levels of C4-C5, C5-C6 and C6-C7. 2. No evidence of an acute fracture or subluxation. Electronically Signed   By: Virgina Norfolk M.D.   On: 01/29/2021 20:32    ____________________________________________   PROCEDURES  Procedure(s) performed (including Critical Care):  .1-3 Lead EKG Interpretation Performed by: Lucrezia Starch, MD Authorized by: Lucrezia Starch, MD  Interpretation: normal     ECG rate assessment: normal     Rhythm: sinus rhythm     Ectopy: none     Conduction: normal       ____________________________________________   INITIAL IMPRESSION / ASSESSMENT AND PLAN / ED COURSE      Patient presents with above to history exam for assessment of altered mental status and per EMS some shortness of breath and possible fall as  they were told by bystanders the patient is slid off the couch.  Patient is confused on arrival and unable provide any significant history.  I was able to reach his daughter who spoke with his girlfriend who was at the scene with patient in his home who stated patient had become confused but did not report any falls.  On arrival patient is afebrile hemodynamically stable on his home 2 L nasal cannula.  He does have some wheezing but no obvious evidence of trauma on exam.  Per family he is typically alert and oriented at baseline and able to ambulate with a walker.  Given history of possible fall age and altered mental status CT head and C-spine obtained does not show evidence of acute injury.  Chest x-ray does not show evidence of pneumonia or rib fracture pneumothorax but does show evidence of old healing fractures.  As D-dimer was elevated CTA obtained which does not show evidence of PE.  CBC does show leukocytosis with WBC count of 18.8.  Hemoglobin is at baseline.  CMP shows no significant electrolyte or metabolic derangements although creatinine is slightly above baseline at 1.32 compared to 0.62 months ago.  BNP is not elevated and overall a low suspicion for acute heart failure.  It is 182 compared to 145 2 months ago.  Troponin is nonelevated at 9 and given patient denies any chest pain no suspicion for ACS.  Procalcitonin is undetectable and Covid is negative.  Ethanol is negative.  We will plan to assess UA and UDS as well.  Given initial wheezing on exam patient treated for COPD exacerbation DuoNeb steroids and antibiotics.  VBG obtained shows a pH of 7.33 with a PCO2 of 65 and a bicarb of 34.3 consistent with chronic respiratory compensated acidosis.  On trial of ambulation on 2.5 L patient desatted to the 70s which improved back to the 90s on 3 and half liters.  Given hypoxia on ambulation we will plan to admit to medicine for further evaluation management with concern for COPD exacerbation.  Of  note on several rechecks patient did seem a lot more oriented and was able to correctly state his location current president month and year.   ____________________________________________   FINAL CLINICAL IMPRESSION(S) / ED DIAGNOSES  Final diagnoses:  Chronic obstructive pulmonary disease with acute exacerbation (HCC)  Chronic respiratory failure with hypoxia (HCC)    Medications  azithromycin (ZITHROMAX) 500 mg in sodium chloride 0.9 % 250 mL IVPB (500 mg Intravenous New Bag/Given 01/29/21 2305)  ketorolac (TORADOL) 30 MG/ML injection 30 mg (has no administration in time range)  ipratropium-albuterol (DUONEB) 0.5-2.5 (3) MG/3ML nebulizer solution 9 mL (9 mLs Nebulization Given 01/29/21 2126)  lactated ringers bolus 1,000 mL (1,000 mLs Intravenous New Bag/Given 01/29/21 2257)  cefTRIAXone (ROCEPHIN) 1 g in sodium chloride 0.9 % 100 mL IVPB (0 g Intravenous Stopped 01/29/21 2158)  iohexol (OMNIPAQUE) 350 MG/ML injection 75 mL (75 mLs Intravenous Contrast Given 01/29/21 2202)  methylPREDNISolone sodium succinate (SOLU-MEDROL) 125 mg/2 mL injection 125 mg (125 mg Intravenous Given 01/29/21  2332)     ED Discharge Orders    None       Note:  This document was prepared using Dragon voice recognition software and may include unintentional dictation errors.   Lucrezia Starch, MD 01/29/21 [phone]

## 2021-01-29 NOTE — ED Notes (Signed)
Pt awake and alert; oriented to person and place at this time.  Reports ongoing mild sob-- at this time O2 sats stable on 3L O2 via [place].  Cardiac monitoring and continuous pulse ox initiated after arrival.  Abdomen soft, nontender.  Multiple scabs to BUE and scabs noted to R and L great toes with BLE edema; more prominent on the L than R.   Pt originally denied pain upon arrival -- now c/o LUE pain which has been intermittent for several days -- pt denies any other recent injury/fall/trauma.

## 2021-01-29 NOTE — ED Notes (Signed)
Patient transported to CT 

## 2021-01-29 NOTE — ED Triage Notes (Signed)
Pt arrives from home via [facility] --pt s/p unwitnessed episode of fall where pt slid off of couch- did not hit head or lose conx.  Per bystanders on scene pt was altered initially -- too weak to stand and walk on his own; usually ambulates independently with cane- pt O2 dependent on 2L however O2 sats mid 80s-- improved to mid 90s on 4L O2 via [place]- all other VSS enroute.  20G L hand placed by ems prior to arrival.  Oriented to person and place at this time.

## 2021-01-30 ENCOUNTER — Encounter: Payer: Self-pay | Admitting: Internal Medicine

## 2021-01-30 DIAGNOSIS — G9341 Metabolic encephalopathy: Secondary | ICD-10-CM | POA: Diagnosis not present

## 2021-01-30 DIAGNOSIS — F1920 Other psychoactive substance dependence, uncomplicated: Secondary | ICD-10-CM

## 2021-01-30 DIAGNOSIS — J441 Chronic obstructive pulmonary disease with (acute) exacerbation: Secondary | ICD-10-CM | POA: Diagnosis not present

## 2021-01-30 LAB — URINALYSIS, COMPLETE (UACMP) WITH MICROSCOPIC
Bacteria, UA: NONE SEEN
Bilirubin Urine: NEGATIVE
Glucose, UA: >=500 mg/dL — AB
Hgb urine dipstick: NEGATIVE
Ketones, ur: NEGATIVE mg/dL
Leukocytes,Ua: NEGATIVE
Nitrite: NEGATIVE
Protein, ur: NEGATIVE mg/dL
Specific Gravity, Urine: 1.026 (ref 1.005–1.030)
Squamous Epithelial / HPF: NONE SEEN (ref 0–5)
WBC, UA: NONE SEEN WBC/hpf (ref 0–5)
pH: 5 (ref 5.0–8.0)

## 2021-01-30 LAB — HEMOGLOBIN A1C
Hgb A1c MFr Bld: 5.5 % (ref 4.8–5.6)
Mean Plasma Glucose: 111.15 mg/dL

## 2021-01-30 LAB — URINE DRUG SCREEN, QUALITATIVE (ARMC ONLY)
Amphetamines, Ur Screen: POSITIVE — AB
Barbiturates, Ur Screen: NOT DETECTED
Benzodiazepine, Ur Scrn: POSITIVE — AB
Cannabinoid 50 Ng, Ur ~~LOC~~: NOT DETECTED
Cocaine Metabolite,Ur ~~LOC~~: NOT DETECTED
MDMA (Ecstasy)Ur Screen: NOT DETECTED
Methadone Scn, Ur: NOT DETECTED
Opiate, Ur Screen: POSITIVE — AB
Phencyclidine (PCP) Ur S: NOT DETECTED
Tricyclic, Ur Screen: NOT DETECTED

## 2021-01-30 LAB — CBC
HCT: 30.5 % — ABNORMAL LOW (ref 39.0–52.0)
Hemoglobin: 9.5 g/dL — ABNORMAL LOW (ref 13.0–17.0)
MCH: 29.7 pg (ref 26.0–34.0)
MCHC: 31.1 g/dL (ref 30.0–36.0)
MCV: 95.3 fL (ref 80.0–100.0)
Platelets: 187 10*3/uL (ref 150–400)
RBC: 3.2 MIL/uL — ABNORMAL LOW (ref 4.22–5.81)
RDW: 15.7 % — ABNORMAL HIGH (ref 11.5–15.5)
WBC: 14.5 10*3/uL — ABNORMAL HIGH (ref 4.0–10.5)
nRBC: 0 % (ref 0.0–0.2)

## 2021-01-30 LAB — CBG MONITORING, ED
Glucose-Capillary: 180 mg/dL — ABNORMAL HIGH (ref 70–99)
Glucose-Capillary: 248 mg/dL — ABNORMAL HIGH (ref 70–99)

## 2021-01-30 LAB — CREATININE, SERUM
Creatinine, Ser: 1.34 mg/dL — ABNORMAL HIGH (ref 0.61–1.24)
GFR, Estimated: 58 mL/min — ABNORMAL LOW (ref >60)

## 2021-01-30 LAB — GLUCOSE, CAPILLARY
Glucose-Capillary: 230 mg/dL — ABNORMAL HIGH (ref 70–99)
Glucose-Capillary: 187 mg/dL — ABNORMAL HIGH (ref 70–99)

## 2021-01-30 LAB — HIV ANTIBODY (ROUTINE TESTING W REFLEX): HIV Screen 4th Generation wRfx: NONREACTIVE

## 2021-01-30 LAB — MRSA PCR SCREENING: MRSA by PCR: POSITIVE — AB

## 2021-01-30 MED ORDER — INSULIN ASPART 100 UNIT/ML ~~LOC~~ SOLN
0.0000 [IU] | Freq: Every day | SUBCUTANEOUS | Status: DC
  Filled 2021-01-30: qty 1

## 2021-01-30 MED ORDER — INSULIN ASPART 100 UNIT/ML ~~LOC~~ SOLN
0.0000 [IU] | Freq: Three times a day (TID) | SUBCUTANEOUS | Status: DC
  Administered 2021-01-30: 3 [IU] via SUBCUTANEOUS
  Administered 2021-01-30 (×2): 5 [IU] via SUBCUTANEOUS
  Administered 2021-01-31 (×2): 3 [IU] via SUBCUTANEOUS
  Filled 2021-01-30 (×5): qty 1

## 2021-01-30 MED ORDER — BUPRENORPHINE HCL-NALOXONE HCL 8-2 MG SL SUBL
1.0000 | SUBLINGUAL_TABLET | Freq: Three times a day (TID) | SUBLINGUAL | Status: DC
  Administered 2021-01-31 – 2021-02-01 (×4): 1 via SUBLINGUAL
  Filled 2021-01-30 (×4): qty 1

## 2021-01-30 MED ORDER — HYDROCHLOROTHIAZIDE 25 MG PO TABS
25.0000 mg | ORAL_TABLET | Freq: Every day | ORAL | Status: DC
  Administered 2021-01-31 – 2021-02-01 (×2): 25 mg via ORAL
  Filled 2021-01-30 (×2): qty 1

## 2021-01-30 MED ORDER — SIMVASTATIN 20 MG PO TABS
40.0000 mg | ORAL_TABLET | Freq: Every day | ORAL | Status: DC
  Administered 2021-01-31: 40 mg via ORAL
  Filled 2021-01-30: qty 2

## 2021-01-30 MED ORDER — CHLORHEXIDINE GLUCONATE CLOTH 2 % EX PADS: 6.0000 | MEDICATED_PAD | Freq: Every day | CUTANEOUS | Status: DC

## 2021-01-30 MED ORDER — ALLOPURINOL 300 MG PO TABS
300.0000 mg | ORAL_TABLET | Freq: Every day | ORAL | Status: DC
  Administered 2021-01-31 – 2021-02-01 (×2): 300 mg via ORAL
  Filled 2021-01-30 (×3): qty 1

## 2021-01-30 MED ORDER — EZETIMIBE 10 MG PO TABS
10.0000 mg | ORAL_TABLET | Freq: Every day | ORAL | Status: DC
  Administered 2021-01-31 – 2021-02-01 (×2): 10 mg via ORAL
  Filled 2021-01-30 (×2): qty 1

## 2021-01-30 MED ORDER — MUPIROCIN 2 % EX OINT
1.0000 "application " | TOPICAL_OINTMENT | Freq: Two times a day (BID) | CUTANEOUS | Status: DC
  Administered 2021-01-30 – 2021-02-01 (×4): 1 via NASAL
  Filled 2021-01-30: qty 22

## 2021-01-30 MED ORDER — DULOXETINE HCL 30 MG PO CPEP
30.0000 mg | ORAL_CAPSULE | Freq: Every day | ORAL | Status: DC
  Administered 2021-01-31 – 2021-02-01 (×2): 30 mg via ORAL
  Filled 2021-01-30 (×2): qty 1

## 2021-01-30 MED ORDER — METOPROLOL SUCCINATE ER 50 MG PO TB24
50.0000 mg | ORAL_TABLET | Freq: Every day | ORAL | Status: DC
  Administered 2021-01-31 – 2021-02-01 (×2): 50 mg via ORAL
  Filled 2021-01-30 (×2): qty 1

## 2021-01-30 MED ORDER — CHLORHEXIDINE GLUCONATE CLOTH 2 % EX PADS
6.0000 | MEDICATED_PAD | Freq: Every day | CUTANEOUS | Status: DC
  Administered 2021-01-31 – 2021-02-01 (×2): 6 via TOPICAL

## 2021-01-30 MED ORDER — BUPRENORPHINE HCL-NALOXONE HCL 8-2 MG SL SUBL
1.0000 | SUBLINGUAL_TABLET | Freq: Three times a day (TID) | SUBLINGUAL | Status: DC
  Administered 2021-01-30: 1 via SUBLINGUAL
  Filled 2021-01-30: qty 1

## 2021-01-30 MED ORDER — ALBUTEROL SULFATE HFA 108 (90 BASE) MCG/ACT IN AERS
1.0000 | INHALATION_SPRAY | RESPIRATORY_TRACT | Status: DC | PRN
  Filled 2021-01-30: qty 6.7

## 2021-01-30 MED ORDER — GABAPENTIN 400 MG PO CAPS
800.0000 mg | ORAL_CAPSULE | Freq: Four times a day (QID) | ORAL | Status: DC
  Administered 2021-01-30 (×3): 800 mg via ORAL
  Filled 2021-01-30 (×2): qty 2
  Filled 2021-01-30: qty 8

## 2021-01-30 MED ORDER — CLOPIDOGREL BISULFATE 75 MG PO TABS
75.0000 mg | ORAL_TABLET | Freq: Every day | ORAL | Status: DC
  Administered 2021-01-31 – 2021-02-01 (×2): 75 mg via ORAL
  Filled 2021-01-30 (×2): qty 1

## 2021-01-30 MED ORDER — SODIUM CHLORIDE 0.9 % IV BOLUS
1000.0000 mL | Freq: Once | INTRAVENOUS | Status: AC
  Administered 2021-01-30: 1000 mL via INTRAVENOUS

## 2021-01-30 NOTE — Evaluation (Signed)
Occupational Therapy Evaluation Patient Details Name: Christian Fields MRN: [id number] DOB: 08-Apr-1956 Today's Date: 01/30/2021    History of Present Illness 65 years old male with PMH significant for COPD on 2 L of home oxygen, NSCLC left upper lobe s/p XRT, anemia, DM 2, HTN, OSA, chronic back pain on Suboxone was brought in by EMS after he was found partially responsive on the couch without recollection of the event.  He was reportedly complaining of shortness of breath prior to the episode.  EMS reported his O2 saturations in mid 80s on arrival with improvement to 92% on 4 L.  CTA chest no evidence of PE,  moderate to severe emphysema. Patient is admitted with acute metabolic encephalopathy could be secondary to polysubstance dependence.  He is also found to have acute on chronic respiratory failure,   Clinical Impression   Christian Fields was seen for OT evaluation this date. Prior to hospital admission, pt was MOD I for mobility and I/ADLs using RW "when I need it," including driving. Pt lives with son and his son's girlfriend in mobile home c 5 STE and B rails. Pt reports son is currently incarcerated but his girlfriend does not work. Pt required SUPERVISION only for ~200 ft mobility no AD. MOD I for LBD seated EOB. Anticipate SUPERVISION for standing grooming tasks 2/2 desat with activity and poor safety awareness. Pt demonstrates near baseline independence to perform ADL and mobility tasks and no strength, sensory, coordination, cognitive, or visual deficits appreciated with assessment. No skilled acute OT needs identified. Will sign off. Please re-consult if additional OT needs arise.  At rest: SpO2 92% on 2L [place] Mobility: HR 130 SpO2 84% on 2L [place], improved to HR 120, SpO2 86% on 3L [place], resolved to low 90s with seated rest    Follow Up Recommendations  No OT follow up    Equipment Recommendations  None recommended by OT    Recommendations for Other Services       Precautions / Restrictions  Precautions Precautions: Fall Restrictions Weight Bearing Restrictions: No      Mobility Bed Mobility Overal bed mobility: Independent                  Transfers Overall transfer level: Needs assistance   Transfers: Sit to/from Stand Sit to Stand: Supervision         General transfer comment: SUPERVISION no AD    Balance Overall balance assessment: Mild deficits observed, not formally tested                                         ADL either performed or assessed with clinical judgement   ADL Overall ADL's : Needs assistance/impaired                                       General ADL Comments: SUPERVISION grossly for ~200 ft mobility no AD. MOD I for LBD seated EOB. Anticipate SUPERVISION for standing grooming tasks                  Pertinent Vitals/Pain Pain Assessment: No/denies pain     Hand Dominance Right   Extremity/Trunk Assessment Upper Extremity Assessment Upper Extremity Assessment: Overall WFL for tasks assessed   Lower Extremity Assessment Lower Extremity Assessment: Overall WFL for tasks assessed  Communication Communication Communication: No difficulties   Cognition Arousal/Alertness: Awake/alert Behavior During Therapy: WFL for tasks assessed/performed Overall Cognitive Status: Within Functional Limits for tasks assessed                                 General Comments: Pt states "I will do anything if it gets me out of here"   General Comments  At rest: SpO2 92% on 2L [place]. Mobility: HR 130 SpO2 84% on 2L [place], improved to HR 120, SpO2 86% on 3L , resolved to low 90s with seated rest    Exercises Exercises: Other exercises Other Exercises Other Exercises: Pt edcuated re: OT role, DME recs, d/c recs, falls prevention, ECS Other Exercises: LBD, sup>sit, sit<>stand, sitting/standing balance/tolerance, ~200 ft mobility   Shoulder Instructions      Home Living Family/patient  expects to be discharged to:: Private residence Living Arrangements: Children (son's girlfriend (son is incarcerated, gf does not work)) Available Help at Discharge: Family;Available PRN/intermittently Type of Home: Mobile home Home Access: Stairs to enter     Home Layout: One level     Bathroom Shower/Tub: Teacher, early years/pre: Standard     Home Equipment: Environmental consultant - 2 wheels;Shower seat   Additional Comments: Pt reports he only uses RW when he is having a "bad day"      Prior Functioning/Environment Level of Independence: Independent with assistive device(s)        Comments: Mod indep with ADLs, housheold mobilization; intermittent use of RW as needed.  Denies fall history.  Home O2 2L "most of the time". Pt drives        OT Problem List: Decreased activity tolerance;Impaired balance (sitting and/or standing);Decreased safety awareness         OT Goals(Current goals can be found in the care plan section) Acute Rehab OT Goals Patient Stated Goal: To go home today OT Goal Formulation: With patient Time For Goal Achievement: 02/13/21 Potential to Achieve Goals: Good  OT Frequency:      AM-PAC OT "6 Clicks" Daily Activity     Outcome Measure Help from another person eating meals?: None Help from another person taking care of personal grooming?: None Help from another person toileting, which includes using toliet, bedpan, or urinal?: A Little Help from another person bathing (including washing, rinsing, drying)?: A Little Help from another person to put on and taking off regular upper body clothing?: None Help from another person to put on and taking off regular lower body clothing?: None 6 Click Score: 22   End of Session Equipment Utilized During Treatment: Oxygen Nurse Communication: Mobility status  Activity Tolerance: Patient tolerated treatment well Patient left: in bed;with call bell/phone within reach;with bed alarm set;with nursing/sitter in  room  OT Visit Diagnosis: Other abnormalities of gait and mobility (R26.89)                Time: 1540-1600 OT Time Calculation (min): 20 min Charges:  OT General Charges $OT Visit: 1 Visit OT Evaluation $OT Eval Low Complexity: 1 Low OT Treatments $Therapeutic Activity: 8-22 mins  Christian Fields Coma, M.S. OTR/L  01/30/21, 4:13 PM  ascom [phone]

## 2021-01-30 NOTE — ED Notes (Signed)
Verbal order for gabapentin 800mg  x4 daily Dr Dwyane Dee as taken at home by pt request

## 2021-01-30 NOTE — Progress Notes (Addendum)
PROGRESS NOTE    Christian Fields  HWE:[id number] DOB: Aug 30, 1956 DOA: 01/29/2021 PCP: Remi Haggard, FNP    Brief Narrative: This 65 years old male with PMH significant for COPD on 2 L of home oxygen, NSCLC left upper lobe s/p XRT, anemia, DM 2, HTN, OSA, chronic back pain on Suboxone was brought in by EMS after he was found partially responsive on the couch without recollection of the event.  He was reportedly complaining of shortness of breath prior to the episode.  EMS reported his O2 saturations in mid 80s on arrival with improvement to 92% on 4 L.  CTA chest no evidence of PE,  moderate to severe emphysema.  CT head C-spine without any significant findings. Patient is admitted with acute metabolic encephalopathy could be secondary to polysubstance dependence.  He is also found to have acute on chronic respiratory failure,  started on nebulization.  Assessment & Plan:   Active Problems:   Hypertension   Diabetes mellitus ([place])   Atherosclerosis of native arteries of extremity with intermittent claudication (HCC)   AAA (abdominal aortic aneurysm) without rupture (HCC)   Acute on chronic respiratory failure with hypoxia and hypercapnia (HCC)   Chronically on opiate therapy   Acute on chronic respiratory failure with hypoxia (HCC)   NSCLC of left lung (HCC)   Acute metabolic encephalopathy   COPD with acute exacerbation (HCC)   Polysubstance (excluding opioids) dependence ([place])   Acute metabolic encephalopathy could be multifactorial: Polysubstance dependence. Patient presented with altered mental status and confusion outside of baseline and a possible presyncopal episode. Suspect could be related to polysubstance dependence including Suboxone UDS returned after admission positive for benzos, opiates and amphetamines Continue Fall and aspiration precautions Frequent Neurologic checks. Patient seems improved, AMS has resolved.   Acute on chronic hypoxic and hypercapnic  respiratory failure : Suspect this could be related to polysubstance dependence and respiratory depression: Opiates amphetamines and benzos CTA chest negative for PE and Covid test negative. EMS recorded O2 sats in the mid 80s on home flow rate at 2 L improving to mid 90s on 4 L and patient showed increased work of breathing on his arrival. Continue Supplemental O2 to keep sats 90-92  COPD  Treated as COPD in the emergency room but no wheezing by the time of admission. Continue scheduled and as needed bronchodilator treatment. Hold off on IV steroids for now given leukocytosis and not strongly convinced about COPD exacerbation.  Leukocytosis: WBC 18K but procalcitonin less than 0.1 so not suspecting acute infection/bacterial infection at this time Continue to monitor We will hold off on antibiotics as well  NSCLC of left lung ([place]) CT with stable pulmonary noduleS. Follows with pulmonology.  Status post XRT  Hypertension Continue hydrochlorothiazide  Diabetes mellitus ([place]) Continue Sliding scale insulin coverage  Atherosclerosis of native arteries of extremity with intermittent claudication (HCC) Continue aspirin, clopidogrel and ezetimibe and simvastatin  Chronically on opiate therapy Suboxone seen on med list. Will hold while altered and resume as appropriate      DVT prophylaxis: Lovenox Code Status: Full code. Family Communication:  Daughter at bedside. Disposition Plan:  Status is: Observation  The patient remains OBS appropriate and will d/c before 2 midnights.  Dispo: The patient is from: Home              Anticipated d/c is to: Home              Anticipated d/c date is: 1 day  Patient currently is not medically stable to d/c.   Difficult to place patient No   Consultants:    None  Procedures: None Antimicrobials:  Anti-infectives (From admission, onward)   Start     Dose/Rate Route Frequency Ordered Stop   01/30/21 2100   cefTRIAXone (ROCEPHIN) 1 g in sodium chloride 0.9 % 100 mL IVPB        1 g 200 mL/hr over 30 Minutes Intravenous Every 24 hours 01/29/21 2351 02/04/21 2059   01/29/21 2100  cefTRIAXone (ROCEPHIN) 1 g in sodium chloride 0.9 % 100 mL IVPB        1 g 200 mL/hr over 30 Minutes Intravenous  Once 01/29/21 2047 01/29/21 2158   01/29/21 2100  azithromycin (ZITHROMAX) 500 mg in sodium chloride 0.9 % 250 mL IVPB        500 mg 250 mL/hr over 60 Minutes Intravenous  Once 01/29/21 2047 01/30/21 0016      Subjective: Patient was seen and examined at bedside.  Overnight events noted.   Patient is alert and oriented x2.  Patient wants to be discharged , states he will sign AMA if he is not discharged today.  Objective: Vitals:   01/30/21 0700 01/30/21 0845 01/30/21 1158 01/30/21 1500  BP: 105/72  108/61 106/66  Pulse: 88 95 96 93  Resp: 14 14 16 15   Temp:    97.7 F (36.5 C)  TempSrc:    Oral  SpO2: 96% 96% 94% 99%  Weight:      Height:        Intake/Output Summary (Last 24 hours) at 01/30/2021 1541 Last data filed at 01/30/2021 2831 Gross per 24 hour  Intake 1000 ml  Output --  Net 1000 ml   Filed Weights   01/29/21 1938  Weight: 70.3 kg    Examination:  General exam: Appears calm and comfortable, not in any acute distress.  Respiratory system: Clear to auscultation. Respiratory effort normal. Cardiovascular system: S1 & S2 heard, RRR. No JVD, murmurs, rubs, gallops or clicks. No pedal edema. Gastrointestinal system: Abdomen is nondistended, soft and nontender. No organomegaly or masses felt. Normal bowel sounds heard. Central nervous system: Alert and oriented. No focal neurological deficits. Extremities: Symmetric 5 x 5 power. Skin: No rashes, lesions or ulcers Psychiatry: Judgement and insight appear normal. Mood & affect appropriate.     Data Reviewed: I have personally reviewed following labs and imaging studies  CBC: Recent Labs  Lab 01/29/21 1946 01/30/21 0055  WBC  18.8* 14.5*  NEUTROABS 16.2*  --   HGB 11.5* 9.5*  HCT 37.3* 30.5*  MCV 94.2 95.3  PLT 219 517   Basic Metabolic Panel: Recent Labs  Lab 01/29/21 1946 01/30/21 0055  NA 133*  --   K 4.4  --   CL 94*  --   CO2 29  --   GLUCOSE 107*  --   BUN 19  --   CREATININE 1.32* 1.34*  CALCIUM 8.9  --    GFR: Estimated Creatinine Clearance: 47.2 mL/min (A) (by C-G formula based on SCr of 1.34 mg/dL (H)). Liver Function Tests: Recent Labs  Lab 01/29/21 1946  AST 22  ALT 8  ALKPHOS 90  BILITOT 0.6  PROT 7.5  ALBUMIN 3.5   No results for input(s): LIPASE, AMYLASE in the last 168 hours. No results for input(s): AMMONIA in the last 168 hours. Coagulation Profile: No results for input(s): INR, PROTIME in the last 168 hours. Cardiac Enzymes: No results for input(s):  CKTOTAL, CKMB, CKMBINDEX, TROPONINI in the last 168 hours. BNP (last 3 results) No results for input(s): PROBNP in the last 8760 hours. HbA1C: Recent Labs    01/30/21 0625  HGBA1C 5.5   CBG: Recent Labs  Lab 01/30/21 0713 01/30/21 1242  GLUCAP 180* 248*   Lipid Profile: No results for input(s): CHOL, HDL, LDLCALC, TRIG, CHOLHDL, LDLDIRECT in the last 72 hours. Thyroid Function Tests: No results for input(s): TSH, T4TOTAL, FREET4, T3FREE, THYROIDAB in the last 72 hours. Anemia Panel: No results for input(s): VITAMINB12, FOLATE, FERRITIN, TIBC, IRON, RETICCTPCT in the last 72 hours. Sepsis Labs: Recent Labs  Lab 01/29/21 1946 01/29/21 2100  PROCALCITON <0.10  --   LATICACIDVEN  --  1.9    Recent Results (from the past 240 hour(s))  Resp Panel by RT-PCR (Flu A&B, Covid) Nasopharyngeal Swab     Status: None   Collection Time: 01/29/21  7:46 PM   Specimen: Nasopharyngeal Swab; Nasopharyngeal(NP) swabs in vial transport medium  Result Value Ref Range Status   SARS Coronavirus 2 by RT PCR NEGATIVE NEGATIVE Final    Comment: (NOTE) SARS-CoV-2 target nucleic acids are NOT DETECTED.  The SARS-CoV-2 RNA is  generally detectable in upper respiratory specimens during the acute phase of infection. The lowest concentration of SARS-CoV-2 viral copies this assay can detect is 138 copies/mL. A negative result does not preclude SARS-Cov-2 infection and should not be used as the sole basis for treatment or other patient management decisions. A negative result may occur with  improper specimen collection/handling, submission of specimen other than nasopharyngeal swab, presence of viral mutation(s) within the areas targeted by this assay, and inadequate number of viral copies(<138 copies/mL). A negative result must be combined with clinical observations, patient history, and epidemiological information. The expected result is Negative.  Fact Sheet for Patients:  EntrepreneurPulse.com.au  Fact Sheet for Healthcare Providers:  IncredibleEmployment.be  This test is no t yet approved or cleared by the [place] FDA and  has been authorized for detection and/or diagnosis of SARS-CoV-2 by FDA under an Emergency Use Authorization (EUA). This EUA will remain  in effect (meaning this test can be used) for the duration of the COVID-19 declaration under Section 564(b)(1) of the Act, 21 U.S.C.section 360bbb-3(b)(1), unless the authorization is terminated  or revoked sooner.       Influenza A by PCR NEGATIVE NEGATIVE Final   Influenza B by PCR NEGATIVE NEGATIVE Final    Comment: (NOTE) The Xpert Xpress SARS-CoV-2/FLU/RSV plus assay is intended as an aid in the diagnosis of influenza from Nasopharyngeal swab specimens and should not be used as a sole basis for treatment. Nasal washings and aspirates are unacceptable for Xpert Xpress SARS-CoV-2/FLU/RSV testing.  Fact Sheet for Patients: EntrepreneurPulse.com.au  Fact Sheet for Healthcare Providers: IncredibleEmployment.be  This test is not yet approved or cleared by the [place] FDA and has been authorized for detection and/or diagnosis of SARS-CoV-2 by FDA under an Emergency Use Authorization (EUA). This EUA will remain in effect (meaning this test can be used) for the duration of the COVID-19 declaration under Section 564(b)(1) of the Act, 21 U.S.C. section 360bbb-3(b)(1), unless the authorization is terminated or revoked.  Performed at [facility], [address] [place], [place] [address]   Blood culture (routine x 2)     Status: None (Preliminary result)   Collection Time: 01/29/21  9:00 PM   Specimen: BLOOD  Result Value Ref Range Status   Specimen Description BLOOD BLOOD RIGHT FOREARM  Final   Special Requests   Final    BOTTLES DRAWN AEROBIC AND ANAEROBIC Blood Culture adequate volume   Culture   Final    NO GROWTH < 12 HOURS Performed at [facility], [address] [place], [place] [address]    Report Status PENDING  Incomplete  Blood culture (routine x 2)     Status: None (Preliminary result)   Collection Time: 01/29/21  9:00 PM   Specimen: BLOOD  Result Value Ref Range Status   Specimen Description BLOOD LEFT ANTECUBITAL  Final   Special Requests   Final    BOTTLES DRAWN AEROBIC AND ANAEROBIC Blood Culture adequate volume   Culture   Final    NO GROWTH < 12 HOURS Performed at [facility], [address], [place], [place] [address]    Report Status PENDING  Incomplete     Radiology Studies: DG Chest 1 View  Result Date: 01/29/2021 CLINICAL DATA:  Unwitnessed fall. EXAM: CHEST  1 VIEW COMPARISON:  November 16, 2020 FINDINGS: Chronic appearing increased lung markings are seen without evidence of acute infiltrate, pleural effusion or pneumothorax. The heart size and mediastinal contours are within normal limits. Mild to moderate severity calcification of the aortic arch is noted. Chronic sixth, seventh, eighth and ninth left rib fractures are seen. IMPRESSION: 1. No evidence of acute or active  cardiopulmonary disease. 2. Multiple chronic left-sided rib fractures. Electronically Signed   By: Virgina Norfolk M.D.   On: 01/29/2021 20:35   CT Head Wo Contrast  Result Date: 01/29/2021 CLINICAL DATA:  Unwitnessed fall. EXAM: CT HEAD WITHOUT CONTRAST TECHNIQUE: Contiguous axial images were obtained from the base of the skull through the vertex without intravenous contrast. COMPARISON:  March 25, 2020 FINDINGS: Brain: There is mild to moderate severity cerebral atrophy with widening of the extra-axial spaces and ventricular dilatation. There are areas of decreased attenuation within the white matter tracts of the supratentorial brain, consistent with microvascular disease changes. Small chronic bilateral basal ganglia lacunar infarcts are seen. Vascular: No hyperdense vessel or unexpected calcification. Skull: Normal. Negative for fracture or focal lesion. Sinuses/Orbits: Mild bilateral anterior ethmoid sinus mucosal thickening is seen. Other: None. IMPRESSION: 1. Mild to moderate severity cerebral atrophy. 2. Small chronic bilateral basal ganglia lacunar infarcts. 3. Mild bilateral anterior ethmoid sinus disease. Electronically Signed   By: Virgina Norfolk M.D.   On: 01/29/2021 20:28   CT Angio Chest PE W and/or Wo Contrast  Result Date: 01/29/2021 CLINICAL DATA:  65 year old male with concern for pulmonary embolism. EXAM: CT ANGIOGRAPHY CHEST WITH CONTRAST TECHNIQUE: Multidetector CT imaging of the chest was performed using the standard protocol during bolus administration of intravenous contrast. Multiplanar CT image reconstructions and MIPs were obtained to evaluate the vascular anatomy. CONTRAST:  63mL OMNIPAQUE IOHEXOL 350 MG/ML SOLN COMPARISON:  Chest CT dated 11/20/2020 and radiograph dated 01/29/2021. FINDINGS: Cardiovascular: There is no cardiomegaly or pericardial effusion. Mild atherosclerotic calcification of the thoracic aorta. No aneurysmal dilatation or dissection. The origins of the  great vessels of the aortic arch appear patent as visualized. Evaluation of the pulmonary arteries is limited due to respiratory motion artifact. No pulmonary artery embolus identified. Mediastinum/Nodes: Mildly enlarged bilateral hilar lymph nodes measuring up to 18 mm in the right hilum. Subcarinal lymph node measures 17 mm. The esophagus is grossly unremarkable. No mediastinal fluid collection. Lungs/Pleura: Moderate to severe emphysema. There are bibasilar subpleural atelectasis/scarring. Similar appearance of a 10 mm left upper lobe subpleural nodule (30/6) or possible  scarring. A 16 mm subpleural nodule or scarring in the superior segment of the left lower lobe is similar to prior CT. Attention on follow-up imaging recommended. No consolidative changes. There is no pleural effusion or pneumothorax. The central airways are patent. Upper Abdomen: Fatty liver. Musculoskeletal: Degenerative changes of the spine. No acute osseous pathology. Old healed bilateral rib fractures. Review of the MIP images confirms the above findings. IMPRESSION: 1. No acute intrathoracic pathology. No CT evidence of pulmonary artery embolus. 2. Moderate to severe emphysema with bibasilar subpleural atelectasis/scarring. 3. Similar appearance of the left lung nodules as described above. Follow-up with CT in 6 months recommended. 4. Aortic Atherosclerosis (ICD10-I70.0) and Emphysema (ICD10-J43.9). Electronically Signed   By: Anner Crete M.D.   On: 01/29/2021 22:34   CT Cervical Spine Wo Contrast  Result Date: 01/29/2021 CLINICAL DATA:  Unwitnessed fall. EXAM: CT CERVICAL SPINE WITHOUT CONTRAST TECHNIQUE: Multidetector CT imaging of the cervical spine was performed without intravenous contrast. Multiplanar CT image reconstructions were also generated. COMPARISON:  January 04, 2006 FINDINGS: Alignment: Normal. Skull base and vertebrae: No acute fracture. No primary bone lesion or focal pathologic process. Soft tissues and spinal  canal: No prevertebral fluid or swelling. No visible canal hematoma. Disc levels: Moderate to marked severity endplate sclerosis and anterior osteophyte formation is seen at the levels of C4-C5, C5-C6 and C6-C7. Mild endplate sclerosis is seen at the level of C3-C4. Moderate severity intervertebral disc space narrowing is seen at the level of C4-C5 with moderate to marked severity intervertebral disc space narrowing noted at the levels of C5-C6 and C6-C7. Mild, bilateral multilevel facet joint hypertrophy is noted. Upper chest: Negative. Other: None. IMPRESSION: 1. Moderate to marked severity multilevel degenerative changes, most prominent at the levels of C4-C5, C5-C6 and C6-C7. 2. No evidence of an acute fracture or subluxation. Electronically Signed   By: Virgina Norfolk M.D.   On: 01/29/2021 20:32   Scheduled Meds: . enoxaparin (LOVENOX) injection  40 mg Subcutaneous Q24H  . gabapentin  800 mg Oral QID  . insulin aspart  0-15 Units Subcutaneous TID WC  . insulin aspart  0-5 Units Subcutaneous QHS  . ipratropium-albuterol  3 mL Nebulization Q6H   Continuous Infusions: . cefTRIAXone (ROCEPHIN)  IV       LOS: 0 days    Time spent: 35 mins.    Shawna Clamp, MD [facility]   If 7PM-7AM, please contact night-coverage

## 2021-01-30 NOTE — Progress Notes (Signed)
PT Screen Note  Patient Details Name: Christian Fields MRN: [id number] DOB: June 20, 1956   Cancelled Treatment:    Reason Eval/Treat Not Completed: PT screened, no needs identified, will sign off Case discussed with OT who had recently been able to do a formal eval with him.  She states he desaturated with ambulation, but was able to do the loop (~200 ft) w/o AD and reports being near baseline.  Pt was willing to do some more activity with PT shortly there after, we were able to walk ~100 ft and negotiate up down steps w/o safety issues (he does have baseline limp, no safety issues).  He was on 3L t/o the effort, sats did drop into the mid/low 80s (difficult to get consistent spO2 readings, but from what I could get and given his history these numbers seem plausible).  After a few minutes of rest sitting/supine he was back up in the 90s, discussed with nursing.   PT screen as pt is at/near baseline and does not require further PT f/u.  Will sign off.  Kreg Shropshire, DPT 01/30/2021, 4:48 PM

## 2021-01-30 NOTE — ED Notes (Signed)
Patient is resting comfortably. Aroused from resting state with verbal stimulation; no acute distress noted.  Pt remains calm and cooperative with staff -- educated pt it is time for another neb treatment and pt is agreeable.  Neb treatment in progress - pt denies any immediate needs, questions, concerns at this time.  Continuous cardiac and pulse ox maintained.  Will monitor for acute changes as pt awaits bed assignment.

## 2021-01-30 NOTE — Progress Notes (Signed)
PT Cancellation Note  Patient Details Name: Christian Fields MRN: [id number] DOB: 1956/07/26   Cancelled Treatment:    Reason Eval/Treat Not Completed: Patient declined, no reason specified Pt refused to work with PT this AM.  Despite attempts to get varying degrees of participation out of him he flatly refused citing being tired and not having gotten much rest.  Family in room and gave no feedback or encouragement to me or the pt.    Kreg Shropshire, DPT 01/30/2021, 1:15 PM

## 2021-01-30 NOTE — Progress Notes (Signed)
Notified MD Dwyane Dee patient is requesting home medications. MD gave me verbal order to order home medications at this time.

## 2021-01-30 NOTE — ED Notes (Signed)
Pt ambulatory to restroom

## 2021-01-30 NOTE — Progress Notes (Signed)
OT Cancellation Note  Patient Details Name: Christian Fields MRN: [id number] DOB: 1956/11/25   Cancelled Treatment:    Reason Eval/Treat Not Completed: Patient declined, no reason specified. Order received, chart reviewed. Upon arrival pt sitting EOB eating [employer] breakfast with family at bedside. Pt requesting to come back after he finishes. Upon return ~20 min later pt reclined in bed asleep, awakes to light and reports he will not get up now, requesting to be seen later. Pt reports he will d/c today regardless of what MD says. Will follow up at later date/time as able to initiate services.   [name] Coma, M.S. OTR/L  01/30/21, 10:17 AM  ascom [phone]

## 2021-01-31 ENCOUNTER — Inpatient Hospital Stay: Payer: Medicare HMO

## 2021-01-31 ENCOUNTER — Telehealth: Payer: Self-pay | Admitting: Oncology

## 2021-01-31 DIAGNOSIS — J441 Chronic obstructive pulmonary disease with (acute) exacerbation: Secondary | ICD-10-CM | POA: Diagnosis not present

## 2021-01-31 DIAGNOSIS — G9341 Metabolic encephalopathy: Secondary | ICD-10-CM | POA: Diagnosis not present

## 2021-01-31 LAB — CBC
HCT: 32.3 % — ABNORMAL LOW (ref 39.0–52.0)
Hemoglobin: 10.3 g/dL — ABNORMAL LOW (ref 13.0–17.0)
MCH: 29.6 pg (ref 26.0–34.0)
MCHC: 31.9 g/dL (ref 30.0–36.0)
MCV: 92.8 fL (ref 80.0–100.0)
Platelets: 206 10*3/uL (ref 150–400)
RBC: 3.48 MIL/uL — ABNORMAL LOW (ref 4.22–5.81)
RDW: 15.3 % (ref 11.5–15.5)
WBC: 17.7 10*3/uL — ABNORMAL HIGH (ref 4.0–10.5)
nRBC: 0 % (ref 0.0–0.2)

## 2021-01-31 LAB — GLUCOSE, CAPILLARY
Glucose-Capillary: 163 mg/dL — ABNORMAL HIGH (ref 70–99)
Glucose-Capillary: 170 mg/dL — ABNORMAL HIGH (ref 70–99)
Glucose-Capillary: 100 mg/dL — ABNORMAL HIGH (ref 70–99)
Glucose-Capillary: 176 mg/dL — ABNORMAL HIGH (ref 70–99)

## 2021-01-31 LAB — PHOSPHORUS: Phosphorus: 3.9 mg/dL (ref 2.5–4.6)

## 2021-01-31 LAB — URINE CULTURE: Culture: NO GROWTH

## 2021-01-31 LAB — MAGNESIUM: Magnesium: 1 mg/dL — ABNORMAL LOW (ref 1.7–2.4)

## 2021-01-31 LAB — BASIC METABOLIC PANEL WITH GFR
Anion gap: 10 (ref 5–15)
BUN: 29 mg/dL — ABNORMAL HIGH (ref 8–23)
CO2: 28 mmol/L (ref 22–32)
Calcium: 7.8 mg/dL — ABNORMAL LOW (ref 8.9–10.3)
Chloride: 97 mmol/L — ABNORMAL LOW (ref 98–111)
Creatinine, Ser: 1.44 mg/dL — ABNORMAL HIGH (ref 0.61–1.24)
GFR, Estimated: 54 mL/min — ABNORMAL LOW (ref >60)
Glucose, Bld: 201 mg/dL — ABNORMAL HIGH (ref 70–99)
Potassium: 4.3 mmol/L (ref 3.5–5.1)
Sodium: 135 mmol/L (ref 135–145)

## 2021-01-31 MED ORDER — MAGNESIUM SULFATE 2 GM/50ML IV SOLN
2.0000 g | Freq: Once | INTRAVENOUS | Status: AC
  Administered 2021-01-31: 2 g via INTRAVENOUS
  Filled 2021-01-31: qty 50

## 2021-01-31 MED ORDER — ENSURE ENLIVE PO LIQD
237.0000 mL | Freq: Three times a day (TID) | ORAL | Status: DC
  Administered 2021-01-31 – 2021-02-01 (×2): 237 mL via ORAL

## 2021-01-31 MED ORDER — GABAPENTIN 400 MG PO CAPS
800.0000 mg | ORAL_CAPSULE | Freq: Four times a day (QID) | ORAL | Status: DC
  Administered 2021-01-31 – 2021-02-01 (×6): 800 mg via ORAL
  Filled 2021-01-31 (×6): qty 2

## 2021-01-31 MED ORDER — IPRATROPIUM-ALBUTEROL 0.5-2.5 (3) MG/3ML IN SOLN
3.0000 mL | Freq: Two times a day (BID) | RESPIRATORY_TRACT | Status: DC
  Administered 2021-01-31 – 2021-02-01 (×2): 3 mL via RESPIRATORY_TRACT
  Filled 2021-01-31 (×2): qty 3

## 2021-01-31 MED ORDER — ADULT MULTIVITAMIN W/MINERALS CH
1.0000 | ORAL_TABLET | Freq: Every day | ORAL | Status: DC
  Administered 2021-02-01: 1 via ORAL
  Filled 2021-01-31: qty 1

## 2021-01-31 NOTE — Progress Notes (Addendum)
Initial Nutrition Assessment  DOCUMENTATION CODES:   Not applicable  INTERVENTION:   Ensure Enlive po TID, each supplement provides 350 kcal and 20 grams of protein  MVI po daily   Liberalize diet   Pt at high refeed risk; recommend monitor potassium, magnesium and phosphorus labs daily until stable  NUTRITION DIAGNOSIS:   Increased nutrient needs related to chronic illness (COPD, NSCLC) as evidenced by estimated needs.  GOAL:   Patient will meet greater than or equal to 90% of their needs  MONITOR:   PO intake,Supplement acceptance,Labs,Weight trends,Skin,I & O's  REASON FOR ASSESSMENT:   Consult Assessment of nutrition requirement/status  ASSESSMENT:   65 years old male with PMH significant for COPD on 2 L of home oxygen, NSCLC left upper lobe s/p XRT, anemia, DM 2, HTN, OSA, chronic back pain and substance abuse on Suboxone who was brought in by EMS for AMS   Unable to see patient today after multiple attempts. Per chart, pt documented to have eaten 30% of his lunch today. RD will add supplements and MVI to help pt meet his estimated needs. RD will also liberalize pt's diet as pt is not eating enough to exceed any nutrient limits. Per chart, pt is down 16lbs(9%) in 2 months; this is significant. RD suspects pt with decreased appetite and oral intake pta. RD will obtain nutrition related history and exam at follow-up. Pt is at high risk for malnutrition.   Medications reviewed and include: allopurinol, plavix, lovenox, insulin, ceftriaxone   Labs reviewed: K 4.3 wnl, BUN 29(H), creat 1.44(H), P 3.9 wnl, Mg 1.0(L) Wbc- 17.7(H), Hgb 10.3(L), Hct 32.3(L) cbgs- 163, 170 x 24 hrs AIC 5.5- 2/22  NUTRITION - FOCUSED PHYSICAL EXAM: Unable to perform at this time   Diet Order:   Diet Order            Diet Heart Room service appropriate? Yes; Fluid consistency: Thin  Diet effective now                EDUCATION NEEDS:   Not appropriate for education at this  time  Skin:  Skin Assessment: Reviewed RN Assessment (ecchymosis)  Last BM:  pta  Height:   Ht Readings from Last 1 Encounters:  01/29/21 5\' 5"  (1.651 m)    Weight:   Wt Readings from Last 1 Encounters:  01/29/21 70.3 kg    Ideal Body Weight:  61.8 kg  BMI:  Body mass index is 25.79 kg/m.  Estimated Nutritional Needs:   Kcal:  1700-2000kcal/day  Protein:  85-100g/day  Fluid:  1.6-1.8L/day  Koleen Distance MS, RD, LDN Please refer to [facility] for RD and/or RD on-call/weekend/after hours pager

## 2021-01-31 NOTE — Telephone Encounter (Signed)
Thanks [name]! Added to my patients to follow-up with list.   Christian Fields

## 2021-01-31 NOTE — Progress Notes (Signed)
PROGRESS NOTE    Christian Fields  VWP:[id number] DOB: [birth year] DOA: 01/29/2021 PCP: Remi Haggard, FNP    Brief Narrative: This 65 years old male with PMH significant for COPD on 2 L of home oxygen, NSCLC left upper lobe s/p XRT, anemia, DM 2, HTN, OSA, chronic back pain on Suboxone was brought in by EMS after he was found partially responsive on the couch without recollection of the event.  He was reportedly complaining of shortness of breath prior to the episode.  EMS reported his O2 saturations in mid 80s on arrival with improvement to 92% on 4 L.  CTA chest no evidence of PE,  moderate to severe emphysema.  CT head C-spine without any significant findings. Patient is admitted with acute metabolic encephalopathy could be secondary to polysubstance dependence.  He is also found to have acute on chronic respiratory failure,  started on nebulization.  Assessment & Plan:   Active Problems:   Hypertension   Diabetes mellitus ([place])   Atherosclerosis of native arteries of extremity with intermittent claudication (HCC)   AAA (abdominal aortic aneurysm) without rupture (HCC)   Acute on chronic respiratory failure with hypoxia and hypercapnia (HCC)   Chronically on opiate therapy   Acute on chronic respiratory failure with hypoxia (HCC)   NSCLC of left lung (HCC)   Acute metabolic encephalopathy   COPD with acute exacerbation (HCC)   Polysubstance (excluding opioids) dependence ([place])   Acute metabolic encephalopathy could be multifactorial: >>> Resolved. Polysubstance dependence. Patient presented with altered mental status and confusion outside of baseline and a possible presyncopal episode. Suspect could be related to polysubstance dependence including Suboxone UDS returned after admission positive for benzos, opiates and amphetamines Continue Fall and aspiration precautions Frequent Neurologic checks. Patient seems improved, AMS has resolved.   Acute on chronic hypoxic and  hypercapnic respiratory failure : Suspect this could be related to polysubstance dependence and respiratory depression: Opiates amphetamines and benzos CTA chest negative for PE and Covid test negative. EMS recorded O2 sats in the mid 80s on home flow rate at 2 L improving to mid 90s on 4 L and patient showed increased work of breathing on his arrival. Continue Supplemental O2 to keep sats 90-92  COPD:  Treated as COPD in the emergency room but no wheezing by the time of admission. Continue scheduled and as needed bronchodilator treatment. Hold off on IV steroids for now given leukocytosis and not strongly convinced about COPD exacerbation.  Leukocytosis: WBC 18K but procalcitonin less than 0.1 so not suspecting acute infection/bacterial infection at this time Continue to monitor, wbc trending down.  Urine culture no growth, awaiting blood cultures. We will hold off on antibiotics as well.   NSCLC of left lung ([place]) CT with stable pulmonary nodules. Follows with pulmonology.  Status post XRT  Hypertension Continue hydrochlorothiazide  Diabetes mellitus (HCC) Continue Sliding scale insulin coverage.  Atherosclerosis of native arteries of extremity with intermittent claudication (HCC) Continue aspirin, clopidogrel and ezetimibe and simvastatin  Chronically on opiate therapy Resume Suboxone.    DVT prophylaxis: Lovenox Code Status: Full code. Family Communication:  Daughter at bedside. Disposition Plan:  Status is: Observation  The patient remains OBS appropriate and will d/c before 2 midnights.  Dispo: The patient is from: Home              Anticipated d/c is to: Home              Anticipated d/c date is: 1 day  Patient currently is not medically stable to d/c.   Difficult to place patient No   Consultants:    None  Procedures: None Antimicrobials:  Anti-infectives (From admission, onward)   Start     Dose/Rate Route Frequency Ordered Stop    01/30/21 2100  cefTRIAXone (ROCEPHIN) 1 g in sodium chloride 0.9 % 100 mL IVPB        1 g 200 mL/hr over 30 Minutes Intravenous Every 24 hours 01/29/21 2351 02/04/21 2059   01/29/21 2100  cefTRIAXone (ROCEPHIN) 1 g in sodium chloride 0.9 % 100 mL IVPB        1 g 200 mL/hr over 30 Minutes Intravenous  Once 01/29/21 2047 01/29/21 2158   01/29/21 2100  azithromycin (ZITHROMAX) 500 mg in sodium chloride 0.9 % 250 mL IVPB        500 mg 250 mL/hr over 60 Minutes Intravenous  Once 01/29/21 2047 01/30/21 0016      Subjective: Patient was seen and examined at bedside.  Overnight events noted.   Patient is alert and oriented x 3.  Patient reports feeling better, denies any chest pain, SOB, dizziness,  He reports having a lot of pain.   Objective: Vitals:   01/31/21 0421 01/31/21 0734 01/31/21 1212 01/31/21 1332  BP: 107/65 132/72 119/87   Pulse: 83 (!) 57 87 86  Resp: 18 18 18 20   Temp: (!) 97.4 F (36.3 C) (!) 97.5 F (36.4 C) 97.7 F (36.5 C)   TempSrc: Oral Oral Oral   SpO2: 96% 97% 94% 92%  Weight:      Height:        Intake/Output Summary (Last 24 hours) at 01/31/2021 1410 Last data filed at 01/31/2021 1401 Gross per 24 hour  Intake 220 ml  Output 1075 ml  Net -855 ml   Filed Weights   01/29/21 1938  Weight: 70.3 kg    Examination:  General exam: Appears calm and comfortable, not in any acute distress.  Respiratory system: Clear to auscultation. Respiratory effort normal. Cardiovascular system: S1 & S2 heard, RRR. No JVD, murmurs, rubs, gallops or clicks. No pedal edema. Gastrointestinal system: Abdomen is nondistended, soft and nontender. No organomegaly or masses felt. Normal bowel sounds heard. Central nervous system: Alert and oriented. No focal neurological deficits. Extremities: Symmetric 5 x 5 power. Skin: No rashes, lesions or ulcers Psychiatry: Judgement and insight appear normal. Mood & affect appropriate.     Data Reviewed: I have personally reviewed  following labs and imaging studies  CBC: Recent Labs  Lab 01/29/21 1946 01/30/21 0055 01/31/21 0518  WBC 18.8* 14.5* 17.7*  NEUTROABS 16.2*  --   --   HGB 11.5* 9.5* 10.3*  HCT 37.3* 30.5* 32.3*  MCV 94.2 95.3 92.8  PLT 219 187 353   Basic Metabolic Panel: Recent Labs  Lab 01/29/21 1946 01/30/21 0055 01/31/21 0518  NA 133*  --  135  K 4.4  --  4.3  CL 94*  --  97*  CO2 29  --  28  GLUCOSE 107*  --  201*  BUN 19  --  29*  CREATININE 1.32* 1.34* 1.44*  CALCIUM 8.9  --  7.8*  MG  --   --  1.0*  PHOS  --   --  3.9   GFR: Estimated Creatinine Clearance: 43.9 mL/min (A) (by C-G formula based on SCr of 1.44 mg/dL (H)). Liver Function Tests: Recent Labs  Lab 01/29/21 1946  AST 22  ALT 8  ALKPHOS 90  BILITOT 0.6  PROT 7.5  ALBUMIN 3.5   No results for input(s): LIPASE, AMYLASE in the last 168 hours. No results for input(s): AMMONIA in the last 168 hours. Coagulation Profile: No results for input(s): INR, PROTIME in the last 168 hours. Cardiac Enzymes: No results for input(s): CKTOTAL, CKMB, CKMBINDEX, TROPONINI in the last 168 hours. BNP (last 3 results) No results for input(s): PROBNP in the last 8760 hours. HbA1C: Recent Labs    01/30/21 0625  HGBA1C 5.5   CBG: Recent Labs  Lab 01/30/21 1242 01/30/21 1648 01/30/21 2037 01/31/21 0736 01/31/21 1211  GLUCAP 248* 230* 187* 163* 170*   Lipid Profile: No results for input(s): CHOL, HDL, LDLCALC, TRIG, CHOLHDL, LDLDIRECT in the last 72 hours. Thyroid Function Tests: No results for input(s): TSH, T4TOTAL, FREET4, T3FREE, THYROIDAB in the last 72 hours. Anemia Panel: No results for input(s): VITAMINB12, FOLATE, FERRITIN, TIBC, IRON, RETICCTPCT in the last 72 hours. Sepsis Labs: Recent Labs  Lab 01/29/21 1946 01/29/21 2100  PROCALCITON <0.10  --   LATICACIDVEN  --  1.9    Recent Results (from the past 240 hour(s))  Resp Panel by RT-PCR (Flu A&B, Covid) Nasopharyngeal Swab     Status: None    Collection Time: 01/29/21  7:46 PM   Specimen: Nasopharyngeal Swab; Nasopharyngeal(NP) swabs in vial transport medium  Result Value Ref Range Status   SARS Coronavirus 2 by RT PCR NEGATIVE NEGATIVE Final    Comment: (NOTE) SARS-CoV-2 target nucleic acids are NOT DETECTED.  The SARS-CoV-2 RNA is generally detectable in upper respiratory specimens during the acute phase of infection. The lowest concentration of SARS-CoV-2 viral copies this assay can detect is 138 copies/mL. A negative result does not preclude SARS-Cov-2 infection and should not be used as the sole basis for treatment or other patient management decisions. A negative result may occur with  improper specimen collection/handling, submission of specimen other than nasopharyngeal swab, presence of viral mutation(s) within the areas targeted by this assay, and inadequate number of viral copies(<138 copies/mL). A negative result must be combined with clinical observations, patient history, and epidemiological information. The expected result is Negative.  Fact Sheet for Patients:  EntrepreneurPulse.com.au  Fact Sheet for Healthcare Providers:  IncredibleEmployment.be  This test is no t yet approved or cleared by the [place] FDA and  has been authorized for detection and/or diagnosis of SARS-CoV-2 by FDA under an Emergency Use Authorization (EUA). This EUA will remain  in effect (meaning this test can be used) for the duration of the COVID-19 declaration under Section 564(b)(1) of the Act, 21 U.S.C.section 360bbb-3(b)(1), unless the authorization is terminated  or revoked sooner.       Influenza A by PCR NEGATIVE NEGATIVE Final   Influenza B by PCR NEGATIVE NEGATIVE Final    Comment: (NOTE) The Xpert Xpress SARS-CoV-2/FLU/RSV plus assay is intended as an aid in the diagnosis of influenza from Nasopharyngeal swab specimens and should not be used as a sole basis for treatment.  Nasal washings and aspirates are unacceptable for Xpert Xpress SARS-CoV-2/FLU/RSV testing.  Fact Sheet for Patients: EntrepreneurPulse.com.au  Fact Sheet for Healthcare Providers: IncredibleEmployment.be  This test is not yet approved or cleared by the [place] FDA and has been authorized for detection and/or diagnosis of SARS-CoV-2 by FDA under an Emergency Use Authorization (EUA). This EUA will remain in effect (meaning this test can be used) for the duration of the COVID-19 declaration under Section 564(b)(1) of the Act, 21 U.S.C. section 360bbb-3(b)(1), unless the  authorization is terminated or revoked.  Performed at [facility], [address] [place], [place] [address]   Blood culture (routine x 2)     Status: None (Preliminary result)   Collection Time: 01/29/21  9:00 PM   Specimen: BLOOD  Result Value Ref Range Status   Specimen Description BLOOD BLOOD RIGHT FOREARM  Final   Special Requests   Final    BOTTLES DRAWN AEROBIC AND ANAEROBIC Blood Culture adequate volume   Culture   Final    NO GROWTH 2 DAYS Performed at [facility], [address] [place], [place] [address]    Report Status PENDING  Incomplete  Blood culture (routine x 2)     Status: None (Preliminary result)   Collection Time: 01/29/21  9:00 PM   Specimen: BLOOD  Result Value Ref Range Status   Specimen Description BLOOD LEFT ANTECUBITAL  Final   Special Requests   Final    BOTTLES DRAWN AEROBIC AND ANAEROBIC Blood Culture adequate volume   Culture   Final    NO GROWTH 2 DAYS Performed at [facility], [address], [place], [place] [address]    Report Status PENDING  Incomplete  Urine culture     Status: None   Collection Time: 01/29/21 10:38 PM   Specimen: In/Out Cath Urine  Result Value Ref Range Status   Specimen Description   Final    IN/OUT CATH URINE Performed at [facility], [address],  [place], [place] [address]    Special Requests   Final    NONE Performed at [facility], [address] [place], [place] [address]    Culture   Final    NO GROWTH Performed at [facility], [place] [address], [place], [place] [address]    Report Status 01/31/2021 FINAL  Final  MRSA PCR Screening     Status: Abnormal   Collection Time: 01/30/21  3:03 PM   Specimen: Nasopharyngeal  Result Value Ref Range Status   MRSA by PCR POSITIVE (A) NEGATIVE Final    Comment:        The GeneXpert MRSA Assay (FDA approved for NASAL specimens only), is one component of a comprehensive MRSA colonization surveillance program. It is not intended to diagnose MRSA infection nor to guide or monitor treatment for MRSA infections. RESULT CALLED TO, READ BACK BY AND VERIFIED WITH: ASHLEY HARRIS AT 2751 ON 01/30/21 BY SS Performed at [facility], [address] [place],  [address]      Radiology Studies: DG Chest 1 View  Result Date: 01/29/2021 CLINICAL DATA:  Unwitnessed fall. EXAM: CHEST  1 VIEW COMPARISON:  November 16, 2020 FINDINGS: Chronic appearing increased lung markings are seen without evidence of acute infiltrate, pleural effusion or pneumothorax. The heart size and mediastinal contours are within normal limits. Mild to moderate severity calcification of the aortic arch is noted. Chronic sixth, seventh, eighth and ninth left rib fractures are seen. IMPRESSION: 1. No evidence of acute or active cardiopulmonary disease. 2. Multiple chronic left-sided rib fractures. Electronically Signed   By: Virgina Norfolk M.D.   On: 01/29/2021 20:35   CT Head Wo Contrast  Result Date: 01/29/2021 CLINICAL DATA:  Unwitnessed fall. EXAM: CT HEAD WITHOUT CONTRAST TECHNIQUE: Contiguous axial images were obtained from the base of the skull through the vertex without intravenous contrast. COMPARISON:  March 25, 2020 FINDINGS: Brain: There is mild to moderate severity cerebral atrophy  with widening of the extra-axial  spaces and ventricular dilatation. There are areas of decreased attenuation within the white matter tracts of the supratentorial brain, consistent with microvascular disease changes. Small chronic bilateral basal ganglia lacunar infarcts are seen. Vascular: No hyperdense vessel or unexpected calcification. Skull: Normal. Negative for fracture or focal lesion. Sinuses/Orbits: Mild bilateral anterior ethmoid sinus mucosal thickening is seen. Other: None. IMPRESSION: 1. Mild to moderate severity cerebral atrophy. 2. Small chronic bilateral basal ganglia lacunar infarcts. 3. Mild bilateral anterior ethmoid sinus disease. Electronically Signed   By: Virgina Norfolk M.D.   On: 01/29/2021 20:28   CT Angio Chest PE W and/or Wo Contrast  Result Date: 01/29/2021 CLINICAL DATA:  65 year old male with concern for pulmonary embolism. EXAM: CT ANGIOGRAPHY CHEST WITH CONTRAST TECHNIQUE: Multidetector CT imaging of the chest was performed using the standard protocol during bolus administration of intravenous contrast. Multiplanar CT image reconstructions and MIPs were obtained to evaluate the vascular anatomy. CONTRAST:  3mL OMNIPAQUE IOHEXOL 350 MG/ML SOLN COMPARISON:  Chest CT dated 11/20/2020 and radiograph dated 01/29/2021. FINDINGS: Cardiovascular: There is no cardiomegaly or pericardial effusion. Mild atherosclerotic calcification of the thoracic aorta. No aneurysmal dilatation or dissection. The origins of the great vessels of the aortic arch appear patent as visualized. Evaluation of the pulmonary arteries is limited due to respiratory motion artifact. No pulmonary artery embolus identified. Mediastinum/Nodes: Mildly enlarged bilateral hilar lymph nodes measuring up to 18 mm in the right hilum. Subcarinal lymph node measures 17 mm. The esophagus is grossly unremarkable. No mediastinal fluid collection. Lungs/Pleura: Moderate to severe emphysema. There are bibasilar subpleural  atelectasis/scarring. Similar appearance of a 10 mm left upper lobe subpleural nodule (30/6) or possible scarring. A 16 mm subpleural nodule or scarring in the superior segment of the left lower lobe is similar to prior CT. Attention on follow-up imaging recommended. No consolidative changes. There is no pleural effusion or pneumothorax. The central airways are patent. Upper Abdomen: Fatty liver. Musculoskeletal: Degenerative changes of the spine. No acute osseous pathology. Old healed bilateral rib fractures. Review of the MIP images confirms the above findings. IMPRESSION: 1. No acute intrathoracic pathology. No CT evidence of pulmonary artery embolus. 2. Moderate to severe emphysema with bibasilar subpleural atelectasis/scarring. 3. Similar appearance of the left lung nodules as described above. Follow-up with CT in 6 months recommended. 4. Aortic Atherosclerosis (ICD10-I70.0) and Emphysema (ICD10-J43.9). Electronically Signed   By: Anner Crete M.D.   On: 01/29/2021 22:34   CT Cervical Spine Wo Contrast  Result Date: 01/29/2021 CLINICAL DATA:  Unwitnessed fall. EXAM: CT CERVICAL SPINE WITHOUT CONTRAST TECHNIQUE: Multidetector CT imaging of the cervical spine was performed without intravenous contrast. Multiplanar CT image reconstructions were also generated. COMPARISON:  January 04, 2006 FINDINGS: Alignment: Normal. Skull base and vertebrae: No acute fracture. No primary bone lesion or focal pathologic process. Soft tissues and spinal canal: No prevertebral fluid or swelling. No visible canal hematoma. Disc levels: Moderate to marked severity endplate sclerosis and anterior osteophyte formation is seen at the levels of C4-C5, C5-C6 and C6-C7. Mild endplate sclerosis is seen at the level of C3-C4. Moderate severity intervertebral disc space narrowing is seen at the level of C4-C5 with moderate to marked severity intervertebral disc space narrowing noted at the levels of C5-C6 and C6-C7. Mild, bilateral  multilevel facet joint hypertrophy is noted. Upper chest: Negative. Other: None. IMPRESSION: 1. Moderate to marked severity multilevel degenerative changes, most prominent at the levels of C4-C5, C5-C6 and C6-C7. 2. No evidence of an acute fracture or subluxation. Electronically  Signed   By: Virgina Norfolk M.D.   On: 01/29/2021 20:32   Scheduled Meds: . allopurinol  300 mg Oral Daily  . buprenorphine-naloxone  1 tablet Sublingual TID  . Chlorhexidine Gluconate Cloth  6 each Topical Q0600  . clopidogrel  75 mg Oral Daily  . DULoxetine  30 mg Oral Daily  . enoxaparin (LOVENOX) injection  40 mg Subcutaneous Q24H  . ezetimibe  10 mg Oral Daily  . gabapentin  800 mg Oral Q6H  . hydrochlorothiazide  25 mg Oral Daily  . insulin aspart  0-15 Units Subcutaneous TID WC  . insulin aspart  0-5 Units Subcutaneous QHS  . ipratropium-albuterol  3 mL Nebulization Q6H  . metoprolol succinate  50 mg Oral Daily  . mupirocin ointment  1 application Nasal BID  . simvastatin  40 mg Oral q1800   Continuous Infusions: . cefTRIAXone (ROCEPHIN)  IV 1 g (01/30/21 2045)     LOS: 0 days    Time spent: 25 mins.    Shawna Clamp, MD [facility]   If 7PM-7AM, please contact night-coverage

## 2021-01-31 NOTE — Progress Notes (Signed)
Mobility Specialist - Progress Note   01/31/21 1253  Mobility  Activity Refused mobility  Mobility performed by Mobility specialist    Pt declined mobility at this time. Pt states that he feels he's near his baseline and has been ambulating nursing unit throughout the day. Will attempt session another date as necessary.    Kathee Delton Mobility Specialist 01/31/21, 1:02 PM

## 2021-01-31 NOTE — Telephone Encounter (Signed)
Pt currently hospitalized. Will not get 2/24 Venofer infusion.

## 2021-02-01 DIAGNOSIS — J441 Chronic obstructive pulmonary disease with (acute) exacerbation: Secondary | ICD-10-CM | POA: Diagnosis not present

## 2021-02-01 DIAGNOSIS — G9341 Metabolic encephalopathy: Secondary | ICD-10-CM | POA: Diagnosis not present

## 2021-02-01 LAB — CBC
HCT: 32.7 % — ABNORMAL LOW (ref 39.0–52.0)
Hemoglobin: 10.3 g/dL — ABNORMAL LOW (ref 13.0–17.0)
MCH: 29.6 pg (ref 26.0–34.0)
MCHC: 31.5 g/dL (ref 30.0–36.0)
MCV: 94 fL (ref 80.0–100.0)
Platelets: 247 10*3/uL (ref 150–400)
RBC: 3.48 MIL/uL — ABNORMAL LOW (ref 4.22–5.81)
RDW: 15.7 % — ABNORMAL HIGH (ref 11.5–15.5)
WBC: 13.8 10*3/uL — ABNORMAL HIGH (ref 4.0–10.5)
nRBC: 0.1 % (ref 0.0–0.2)

## 2021-02-01 LAB — MAGNESIUM: Magnesium: 1.4 mg/dL — ABNORMAL LOW (ref 1.7–2.4)

## 2021-02-01 LAB — BASIC METABOLIC PANEL
Anion gap: 8 (ref 5–15)
BUN: 30 mg/dL — ABNORMAL HIGH (ref 8–23)
CO2: 31 mmol/L (ref 22–32)
Calcium: 8.4 mg/dL — ABNORMAL LOW (ref 8.9–10.3)
Chloride: 97 mmol/L — ABNORMAL LOW (ref 98–111)
Creatinine, Ser: 1.05 mg/dL (ref 0.61–1.24)
GFR, Estimated: >60 mL/min (ref >60)
Glucose, Bld: 129 mg/dL — ABNORMAL HIGH (ref 70–99)
Potassium: 4.4 mmol/L (ref 3.5–5.1)
Sodium: 136 mmol/L (ref 135–145)

## 2021-02-01 LAB — GLUCOSE, CAPILLARY: Glucose-Capillary: 81 mg/dL (ref 70–99)

## 2021-02-01 LAB — PHOSPHORUS: Phosphorus: 3.1 mg/dL (ref 2.5–4.6)

## 2021-02-01 MED ORDER — CLOPIDOGREL BISULFATE 75 MG PO TABS: 75.0000 mg | ORAL_TABLET | Freq: Every day | ORAL | 1 refills | Status: DC

## 2021-02-01 MED ORDER — MAGNESIUM SULFATE 2 GM/50ML IV SOLN
2.0000 g | Freq: Once | INTRAVENOUS | Status: AC
  Administered 2021-02-01: 2 g via INTRAVENOUS
  Filled 2021-02-01: qty 50

## 2021-02-01 NOTE — Discharge Instructions (Signed)
Advised to follow-up with primary care physician in 1 week.   Patient was counseled about avoiding street drugs. Patient has been discharged on 2 L of supplemental oxygen which is his usual baseline home oxygen requirement.

## 2021-02-01 NOTE — Plan of Care (Signed)
  Problem: Education: Goal: Knowledge of General Education information will improve Description: Including pain rating scale, medication(s)/side effects and non-pharmacologic comfort measures 02/01/2021 1126 by Alen Blew, RN Outcome: Adequate for Discharge 02/01/2021 1126 by Alen Blew, RN Outcome: Adequate for Discharge   Problem: Health Behavior/Discharge Planning: Goal: Ability to manage health-related needs will improve 02/01/2021 1126 by Alen Blew, RN Outcome: Adequate for Discharge 02/01/2021 1126 by Alen Blew, RN Outcome: Adequate for Discharge   Problem: Health Behavior/Discharge Planning: Goal: Ability to manage health-related needs will improve 02/01/2021 1126 by Alen Blew, RN Outcome: Adequate for Discharge 02/01/2021 1126 by Alen Blew, RN Outcome: Adequate for Discharge   Problem: Clinical Measurements: Goal: Ability to maintain clinical measurements within normal limits will improve 02/01/2021 1126 by Roanna Epley D, RN Outcome: Adequate for Discharge 02/01/2021 1126 by Alen Blew, RN Outcome: Adequate for Discharge Goal: Will remain free from infection 02/01/2021 1126 by Roanna Epley D, RN Outcome: Adequate for Discharge 02/01/2021 1126 by Alen Blew, RN Outcome: Adequate for Discharge Goal: Diagnostic test results will improve 02/01/2021 1126 by Alen Blew, RN Outcome: Adequate for Discharge 02/01/2021 1126 by Alen Blew, RN Outcome: Adequate for Discharge Goal: Respiratory complications will improve 02/01/2021 1126 by Alen Blew, RN Outcome: Adequate for Discharge 02/01/2021 1126 by Alen Blew, RN Outcome: Adequate for Discharge Goal: Cardiovascular complication will be avoided 02/01/2021 1126 by Roanna Epley D, RN Outcome: Adequate for Discharge 02/01/2021 1126 by Alen Blew, RN Outcome: Adequate for Discharge   Problem: Activity: Goal: Risk for activity intolerance will  decrease 02/01/2021 1126 by Alen Blew, RN Outcome: Adequate for Discharge 02/01/2021 1126 by Alen Blew, RN Outcome: Adequate for Discharge   Problem: Coping: Goal: Level of anxiety will decrease 02/01/2021 1126 by Alen Blew, RN Outcome: Adequate for Discharge 02/01/2021 1126 by Alen Blew, RN Outcome: Adequate for Discharge   Problem: Elimination: Goal: Will not experience complications related to bowel motility 02/01/2021 1126 by Alen Blew, RN Outcome: Adequate for Discharge 02/01/2021 1126 by Alen Blew, RN Outcome: Adequate for Discharge Goal: Will not experience complications related to urinary retention 02/01/2021 1126 by Alen Blew, RN Outcome: Adequate for Discharge 02/01/2021 1126 by Alen Blew, RN Outcome: Adequate for Discharge   Problem: Pain Managment: Goal: General experience of comfort will improve 02/01/2021 1126 by Alen Blew, RN Outcome: Adequate for Discharge 02/01/2021 1126 by Alen Blew, RN Outcome: Adequate for Discharge   Problem: Safety: Goal: Ability to remain free from injury will improve 02/01/2021 1126 by Roanna Epley D, RN Outcome: Adequate for Discharge 02/01/2021 1126 by Alen Blew, RN Outcome: Adequate for Discharge   Problem: Skin Integrity: Goal: Risk for impaired skin integrity will decrease 02/01/2021 1126 by Alen Blew, RN Outcome: Adequate for Discharge 02/01/2021 1126 by Alen Blew, RN Outcome: Adequate for Discharge   Problem: Increased Nutrient Needs (NI-5.1) Goal: Food and/or nutrient delivery Description: Individualized approach for food/nutrient provision. Outcome: Adequate for Discharge

## 2021-02-01 NOTE — Discharge Summary (Signed)
Physician Discharge Summary  Christian Fields VZD:[id number] DOB: [birth year] DOA: 01/29/2021  PCP: Remi Haggard, FNP  Admit date: 01/29/2021   Discharge date: 02/01/2021  Admitted From:  Home.  Disposition:  Home  Recommendations for Outpatient Follow-up:  Follow up with PCP in 1-2 weeks. Please obtain BMP/CBC in one week Patient was counseled about avoiding street drugs. Patient has been discharged home on 2 L of supplemental oxygen which is his usual baseline home oxygen requirement.  Home Health: None.  Equipment/Devices: Home Oxygen therapy  Discharge Condition: Stable CODE STATUS:Full code Diet recommendation: Heart Healthy    Brief Summary / Hospital Course: This 65 years old male with PMH significant for COPD on 2 L of home oxygen, NSCLC left upper lobe s/p XRT, anemia, DM 2, HTN, OSA, chronic back pain on Suboxone was brought in by EMS after he was found partially responsive on the couch without recollection of the event.  He was reportedly complaining of shortness of breath prior to the episode.  EMS reported his O2 saturations in mid 80s on arrival with improvement to 92% on 4 L.  CTA chest no evidence of PE,  moderate to severe emphysema.  CT head,  C-spine without any significant findings. Patient was admitted for acute metabolic encephalopathy , could be secondary to polysubstance dependence.  He was also found to have acute on chronic respiratory failure,  started on nebulization.  Patient's urine drug screen was positive for benzodiazepines, amphetamines and opiates.  Patient reports buying amphetamines from the street because he has no energy.  Patient was treated for COPD exacerbation with nebulization,  he has not required solu-Medrol.  Patient back to his baseline mental status.  Patient's home medications were resumed.  Patient seems improved,  patient has ambulated on his baseline oxygen requirement of 2 L/min.  He feels better and want to be discharged.  Patient was  counseled in detail about avoiding street drugs.  Patient is being discharged home,  advised to follow-up with primary care physician.   He was managed for below problems.   Discharge Diagnoses:  Active Problems:   Hypertension   Diabetes mellitus ([place])   Atherosclerosis of native arteries of extremity with intermittent claudication (HCC)   AAA (abdominal aortic aneurysm) without rupture (HCC)   Acute on chronic respiratory failure with hypoxia and hypercapnia (HCC)   Chronically on opiate therapy   Acute on chronic respiratory failure with hypoxia (HCC)   NSCLC of left lung (HCC)   Acute metabolic encephalopathy   COPD with acute exacerbation (HCC)   Polysubstance (excluding opioids) dependence ([place])  Acute metabolic encephalopathy could be multifactorial: >>> Resolved. Polysubstance dependence. Patient presented with altered mental status and confusion outside of baseline and possible presyncopal episode. Suspect could be related to polysubstance dependence including Suboxone UDS returned after admission positive for benzos, opiates and amphetamines Continue Fall and aspiration precautions Frequent Neurologic checks. Patient seems improved, AMS has resolved.   Acute on chronic hypoxic and hypercapnic respiratory failure : Suspect this could be related to polysubstance dependence and respiratory depression: Opiates,  amphetamines and benzos CTA chest negative for PE and Covid test negative. EMS recorded O2 sats in the mid 80s on home flow rate at 2 L improving to mid 90s on 4 L and patient showed increased work of breathing on his arrival. Continue Supplemental O2 to keep sats 90-92   COPD:  Treated as COPD in the emergency room but no wheezing by the time of admission. Continue scheduled and  as needed bronchodilator treatment. Hold off on IV steroids for now given leukocytosis and not strongly convinced about COPD exacerbation.   Leukocytosis: WBC 18K but procalcitonin less  than 0.1 so not suspecting acute infection/bacterial infection at this time Continue to monitor, wbc trending down.  Urine culture no growth, awaiting blood cultures. We will hold off on antibiotics as well.     NSCLC of left lung ([place]) CT with stable pulmonary nodules. Follows with pulmonology.  Status post XRT   Hypertension Continue hydrochlorothiazide   Diabetes mellitus (HCC) Continue Sliding scale insulin coverage.   Atherosclerosis of native arteries of extremity with intermittent claudication (HCC) Continue aspirin, clopidogrel and ezetimibe and simvastatin   Chronically on opiate therapy Resume Suboxone.  Discharge Instructions  Discharge Instructions     Call MD for:  difficulty breathing, headache or visual disturbances   Complete by: As directed    Call MD for:  persistant dizziness or light-headedness   Complete by: As directed    Call MD for:  persistant nausea and vomiting   Complete by: As directed    Diet - low sodium heart healthy   Complete by: As directed    Diet Carb Modified   Complete by: As directed    Discharge instructions   Complete by: As directed    Advised to follow-up with primary care physician in 1 week.   Patient was counseled about avoiding street drugs. Patient has been discharged on 2 L of supplemental oxygen which is his usual baseline home oxygen requirement.   Increase activity slowly   Complete by: As directed       Allergies as of 02/01/2021       Reactions   Bee Venom Anaphylaxis   Hydrocodone-acetaminophen Itching        Medication List     TAKE these medications    albuterol 108 (90 Base) MCG/ACT inhaler Commonly known as: VENTOLIN HFA Inhale 2 puffs into the lungs every 4 (four) hours as needed for wheezing or shortness of breath.   alendronate 70 MG tablet Commonly known as: FOSAMAX Take 70 mg by mouth once a week.   allopurinol 300 MG tablet Commonly known as: ZYLOPRIM Take 600 mg by mouth 2 (two)  times daily.   aspirin EC 81 MG tablet Take 1 tablet by mouth daily.   buprenorphine-naloxone 8-2 mg Subl SL tablet Commonly known as: SUBOXONE Place 1 tablet under the tongue 3 (three) times daily. What changed: Another medication with the same name was removed. Continue taking this medication, and follow the directions you see here.   cholecalciferol 1000 units tablet Commonly known as: VITAMIN D Take 1,000 Units by mouth daily.   clopidogrel 75 MG tablet Commonly known as: PLAVIX Take 1 tablet (75 mg total) by mouth daily. Start taking on: February 02, 2021   DULoxetine 30 MG capsule Commonly known as: CYMBALTA Take 1 capsule by mouth daily.   ezetimibe 10 MG tablet Commonly known as: ZETIA Take 10 mg by mouth daily.   Farxiga 10 MG Tabs tablet Generic drug: dapagliflozin propanediol Take 10 mg by mouth daily.   ferrous sulfate 325 (65 FE) MG tablet Take 325 mg by mouth daily with breakfast.   fluticasone 50 MCG/ACT nasal spray Commonly known as: FLONASE Place 2 sprays into both nostrils daily.   gabapentin 800 MG tablet Commonly known as: NEURONTIN Take 800 mg by mouth 4 (four) times daily.   hydrochlorothiazide 12.5 MG tablet Commonly known as: HYDRODIURIL Take 1 tablet (  12.5 mg total) by mouth daily. What changed:  how much to take Another medication with the same name was removed. Continue taking this medication, and follow the directions you see here.   ipratropium 0.02 % nebulizer solution Commonly known as: ATROVENT Inhale 2.5 mL (500 mcg total) by nebulization every 6 (six) hours.   Kombiglyze XR 2.04-999 MG Tb24 Generic drug: Saxagliptin-Metformin Take 2 tablets by mouth daily.   metoprolol succinate 50 MG 24 hr tablet Commonly known as: TOPROL-XL Take 50 mg by mouth daily.   ondansetron 4 MG tablet Commonly known as: ZOFRAN Take 4 mg by mouth every 4 (four) hours as needed for nausea or vomiting.   OXYGEN Inhale 2 L into the lungs.    pantoprazole 40 MG tablet Commonly known as: PROTONIX Take 40 mg by mouth daily.   simvastatin 40 MG tablet Commonly known as: ZOCOR Take 1 tablet by mouth daily.   Trelegy Ellipta 100-62.5-25 MCG/INH Aepb Generic drug: Fluticasone-Umeclidin-Vilant Inhale 1 puff into the lungs daily.        Follow-up Information     Remi Haggard, FNP Follow up in 1 week(s).   Specialty: Family Medicine Contact information: [place] [address] [phone]                Allergies  Allergen Reactions   Bee Venom Anaphylaxis   Hydrocodone-Acetaminophen Itching    Consultations: None   Procedures/Studies: DG Chest 1 View  Result Date: 01/29/2021 CLINICAL DATA:  Unwitnessed fall. EXAM: CHEST  1 VIEW COMPARISON:  November 16, 2020 FINDINGS: Chronic appearing increased lung markings are seen without evidence of acute infiltrate, pleural effusion or pneumothorax. The heart size and mediastinal contours are within normal limits. Mild to moderate severity calcification of the aortic arch is noted. Chronic sixth, seventh, eighth and ninth left rib fractures are seen. IMPRESSION: 1. No evidence of acute or active cardiopulmonary disease. 2. Multiple chronic left-sided rib fractures. Electronically Signed   By: Virgina Norfolk M.D.   On: 01/29/2021 20:35   CT Head Wo Contrast  Result Date: 01/29/2021 CLINICAL DATA:  Unwitnessed fall. EXAM: CT HEAD WITHOUT CONTRAST TECHNIQUE: Contiguous axial images were obtained from the base of the skull through the vertex without intravenous contrast. COMPARISON:  March 25, 2020 FINDINGS: Brain: There is mild to moderate severity cerebral atrophy with widening of the extra-axial spaces and ventricular dilatation. There are areas of decreased attenuation within the white matter tracts of the supratentorial brain, consistent with microvascular disease changes. Small chronic bilateral basal ganglia lacunar infarcts are seen. Vascular:  No hyperdense vessel or unexpected calcification. Skull: Normal. Negative for fracture or focal lesion. Sinuses/Orbits: Mild bilateral anterior ethmoid sinus mucosal thickening is seen. Other: None. IMPRESSION: 1. Mild to moderate severity cerebral atrophy. 2. Small chronic bilateral basal ganglia lacunar infarcts. 3. Mild bilateral anterior ethmoid sinus disease. Electronically Signed   By: Virgina Norfolk M.D.   On: 01/29/2021 20:28   CT Angio Chest PE W and/or Wo Contrast  Result Date: 01/29/2021 CLINICAL DATA:  65 year old male with concern for pulmonary embolism. EXAM: CT ANGIOGRAPHY CHEST WITH CONTRAST TECHNIQUE: Multidetector CT imaging of the chest was performed using the standard protocol during bolus administration of intravenous contrast. Multiplanar CT image reconstructions and MIPs were obtained to evaluate the vascular anatomy. CONTRAST:  88mL OMNIPAQUE IOHEXOL 350 MG/ML SOLN COMPARISON:  Chest CT dated 11/20/2020 and radiograph dated 01/29/2021. FINDINGS: Cardiovascular: There is no cardiomegaly or pericardial effusion. Mild atherosclerotic calcification of the thoracic aorta.  No aneurysmal dilatation or dissection. The origins of the great vessels of the aortic arch appear patent as visualized. Evaluation of the pulmonary arteries is limited due to respiratory motion artifact. No pulmonary artery embolus identified. Mediastinum/Nodes: Mildly enlarged bilateral hilar lymph nodes measuring up to 18 mm in the right hilum. Subcarinal lymph node measures 17 mm. The esophagus is grossly unremarkable. No mediastinal fluid collection. Lungs/Pleura: Moderate to severe emphysema. There are bibasilar subpleural atelectasis/scarring. Similar appearance of a 10 mm left upper lobe subpleural nodule (30/6) or possible scarring. A 16 mm subpleural nodule or scarring in the superior segment of the left lower lobe is similar to prior CT. Attention on follow-up imaging recommended. No consolidative changes. There  is no pleural effusion or pneumothorax. The central airways are patent. Upper Abdomen: Fatty liver. Musculoskeletal: Degenerative changes of the spine. No acute osseous pathology. Old healed bilateral rib fractures. Review of the MIP images confirms the above findings. IMPRESSION: 1. No acute intrathoracic pathology. No CT evidence of pulmonary artery embolus. 2. Moderate to severe emphysema with bibasilar subpleural atelectasis/scarring. 3. Similar appearance of the left lung nodules as described above. Follow-up with CT in 6 months recommended. 4. Aortic Atherosclerosis (ICD10-I70.0) and Emphysema (ICD10-J43.9). Electronically Signed   By: Anner Crete M.D.   On: 01/29/2021 22:34   CT Cervical Spine Wo Contrast  Result Date: 01/29/2021 CLINICAL DATA:  Unwitnessed fall. EXAM: CT CERVICAL SPINE WITHOUT CONTRAST TECHNIQUE: Multidetector CT imaging of the cervical spine was performed without intravenous contrast. Multiplanar CT image reconstructions were also generated. COMPARISON:  January 04, 2006 FINDINGS: Alignment: Normal. Skull base and vertebrae: No acute fracture. No primary bone lesion or focal pathologic process. Soft tissues and spinal canal: No prevertebral fluid or swelling. No visible canal hematoma. Disc levels: Moderate to marked severity endplate sclerosis and anterior osteophyte formation is seen at the levels of C4-C5, C5-C6 and C6-C7. Mild endplate sclerosis is seen at the level of C3-C4. Moderate severity intervertebral disc space narrowing is seen at the level of C4-C5 with moderate to marked severity intervertebral disc space narrowing noted at the levels of C5-C6 and C6-C7. Mild, bilateral multilevel facet joint hypertrophy is noted. Upper chest: Negative. Other: None. IMPRESSION: 1. Moderate to marked severity multilevel degenerative changes, most prominent at the levels of C4-C5, C5-C6 and C6-C7. 2. No evidence of an acute fracture or subluxation. Electronically Signed   By: Virgina Norfolk M.D.   On: 01/29/2021 20:32      Subjective: Patient was seen and examined at bedside.  Overnight events noted.  Patient reports feeling much better.  Patient wants to be discharged home.  Patient is being discharged  Discharge Exam: Vitals:   02/01/21 0413 02/01/21 0756  BP: 106/62 137/80  Pulse: 79 73  Resp: 18 18  Temp: 97.8 F (36.6 C) 97.9 F (36.6 C)  SpO2: 93% 97%   Vitals:   01/31/21 2016 01/31/21 2144 02/01/21 0413 02/01/21 0756  BP: 132/77  106/62 137/80  Pulse: 78  79 73  Resp: 18  18 18   Temp: 97.8 F (36.6 C)  97.8 F (36.6 C) 97.9 F (36.6 C)  TempSrc: Oral   Oral  SpO2: 95% 93% 93% 97%  Weight:   81.4 kg   Height:        General: Pt is alert, awake, not in acute distress Cardiovascular: RRR, S1/S2 +, no rubs, no gallops Respiratory: CTA bilaterally, no wheezing, no rhonchi Abdominal: Soft, NT, ND, bowel sounds + Extremities: no edema, no  cyanosis    The results of significant diagnostics from this hospitalization (including imaging, microbiology, ancillary and laboratory) are listed below for reference.     Microbiology: Recent Results (from the past 240 hour(s))  Resp Panel by RT-PCR (Flu A&B, Covid) Nasopharyngeal Swab     Status: None   Collection Time: 01/29/21  7:46 PM   Specimen: Nasopharyngeal Swab; Nasopharyngeal(NP) swabs in vial transport medium  Result Value Ref Range Status   SARS Coronavirus 2 by RT PCR NEGATIVE NEGATIVE Final    Comment: (NOTE) SARS-CoV-2 target nucleic acids are NOT DETECTED.  The SARS-CoV-2 RNA is generally detectable in upper respiratory specimens during the acute phase of infection. The lowest concentration of SARS-CoV-2 viral copies this assay can detect is 138 copies/mL. A negative result does not preclude SARS-Cov-2 infection and should not be used as the sole basis for treatment or other patient management decisions. A negative result may occur with  improper specimen collection/handling,  submission of specimen other than nasopharyngeal swab, presence of viral mutation(s) within the areas targeted by this assay, and inadequate number of viral copies(<138 copies/mL). A negative result must be combined with clinical observations, patient history, and epidemiological information. The expected result is Negative.  Fact Sheet for Patients:  EntrepreneurPulse.com.au  Fact Sheet for Healthcare Providers:  IncredibleEmployment.be  This test is no t yet approved or cleared by the [place] FDA and  has been authorized for detection and/or diagnosis of SARS-CoV-2 by FDA under an Emergency Use Authorization (EUA). This EUA will remain  in effect (meaning this test can be used) for the duration of the COVID-19 declaration under Section 564(b)(1) of the Act, 21 U.S.C.section 360bbb-3(b)(1), unless the authorization is terminated  or revoked sooner.       Influenza A by PCR NEGATIVE NEGATIVE Final   Influenza B by PCR NEGATIVE NEGATIVE Final    Comment: (NOTE) The Xpert Xpress SARS-CoV-2/FLU/RSV plus assay is intended as an aid in the diagnosis of influenza from Nasopharyngeal swab specimens and should not be used as a sole basis for treatment. Nasal washings and aspirates are unacceptable for Xpert Xpress SARS-CoV-2/FLU/RSV testing.  Fact Sheet for Patients: EntrepreneurPulse.com.au  Fact Sheet for Healthcare Providers: IncredibleEmployment.be  This test is not yet approved or cleared by the [place] FDA and has been authorized for detection and/or diagnosis of SARS-CoV-2 by FDA under an Emergency Use Authorization (EUA). This EUA will remain in effect (meaning this test can be used) for the duration of the COVID-19 declaration under Section 564(b)(1) of the Act, 21 U.S.C. section 360bbb-3(b)(1), unless the authorization is terminated or revoked.  Performed at [facility], [address] [place], [place] [address]   Blood culture (routine x 2)     Status: None (Preliminary result)   Collection Time: 01/29/21  9:00 PM   Specimen: BLOOD  Result Value Ref Range Status   Specimen Description BLOOD BLOOD RIGHT FOREARM  Final   Special Requests   Final    BOTTLES DRAWN AEROBIC AND ANAEROBIC Blood Culture adequate volume   Culture   Final    NO GROWTH 3 DAYS Performed at [facility], [address] [place], [place] [address]    Report Status PENDING  Incomplete  Blood culture (routine x 2)     Status: None (Preliminary result)   Collection Time: 01/29/21  9:00 PM   Specimen: BLOOD  Result Value Ref Range Status   Specimen Description BLOOD LEFT ANTECUBITAL  Final   Special Requests  Final    BOTTLES DRAWN AEROBIC AND ANAEROBIC Blood Culture adequate volume   Culture   Final    NO GROWTH 3 DAYS Performed at [facility], [address] [place], [place] [address]    Report Status PENDING  Incomplete  Urine culture     Status: None   Collection Time: 01/29/21 10:38 PM   Specimen: In/Out Cath Urine  Result Value Ref Range Status   Specimen Description   Final    IN/OUT CATH URINE Performed at [facility], [address], [place], [place] [address]    Special Requests   Final    NONE Performed at [facility], [address], [place], [place] [address]    Culture   Final    NO GROWTH Performed at [facility], [place] [address], [place], [place] [address]    Report Status 01/31/2021 FINAL  Final  MRSA PCR Screening     Status: Abnormal   Collection Time: 01/30/21  3:03 PM   Specimen: Nasopharyngeal  Result Value Ref Range Status   MRSA by PCR POSITIVE (A) NEGATIVE Final    Comment:        The GeneXpert MRSA Assay (FDA approved for NASAL specimens only), is one component of a comprehensive MRSA colonization surveillance program. It is not intended to diagnose MRSA infection nor to guide or monitor  treatment for MRSA infections. RESULT CALLED TO, READ BACK BY AND VERIFIED WITH: ASHLEY HARRIS AT 0258 ON 01/30/21 BY SS Performed at [facility], [address] [place],  [address]      Labs: BNP (last 3 results) Recent Labs    11/12/20 2108 01/29/21 1946  BNP 145.1* 242.3*   Basic Metabolic Panel: Recent Labs  Lab 01/29/21 1946 01/30/21 0055 01/31/21 0518 02/01/21 0550  NA 133*  --  135 136  K 4.4  --  4.3 4.4  CL 94*  --  97* 97*  CO2 29  --  28 31  GLUCOSE 107*  --  201* 129*  BUN 19  --  29* 30*  CREATININE 1.32* 1.34* 1.44* 1.05  CALCIUM 8.9  --  7.8* 8.4*  MG  --   --  1.0* 1.4*  PHOS  --   --  3.9 3.1   Liver Function Tests: Recent Labs  Lab 01/29/21 1946  AST 22  ALT 8  ALKPHOS 90  BILITOT 0.6  PROT 7.5  ALBUMIN 3.5   No results for input(s): LIPASE, AMYLASE in the last 168 hours. No results for input(s): AMMONIA in the last 168 hours. CBC: Recent Labs  Lab 01/29/21 1946 01/30/21 0055 01/31/21 0518 02/01/21 0550  WBC 18.8* 14.5* 17.7* 13.8*  NEUTROABS 16.2*  --   --   --   HGB 11.5* 9.5* 10.3* 10.3*  HCT 37.3* 30.5* 32.3* 32.7*  MCV 94.2 95.3 92.8 94.0  PLT 219 187 206 247   Cardiac Enzymes: No results for input(s): CKTOTAL, CKMB, CKMBINDEX, TROPONINI in the last 168 hours. BNP: Invalid input(s): POCBNP CBG: Recent Labs  Lab 01/31/21 0736 01/31/21 1211 01/31/21 1652 01/31/21 2016 02/01/21 0754  GLUCAP 163* 170* 100* 176* 81   D-Dimer Recent Labs    01/29/21 1946  DDIMER 1.40*   Hgb A1c Recent Labs    01/30/21 0625  HGBA1C 5.5   Lipid Profile No results for input(s): CHOL, HDL, LDLCALC, TRIG, CHOLHDL, LDLDIRECT in the last 72 hours. Thyroid function studies No results for input(s): TSH, T4TOTAL, T3FREE,  THYROIDAB in the last 72 hours.  Invalid input(s): FREET3 Anemia work up No results for input(s): VITAMINB12, FOLATE, FERRITIN, TIBC, IRON, RETICCTPCT in the last 72 hours. Urinalysis     Component Value Date/Time   COLORURINE YELLOW (A) 01/29/2021 2238   APPEARANCEUR CLEAR (A) 01/29/2021 2238   APPEARANCEUR Clear 03/24/2013 1853   LABSPEC 1.026 01/29/2021 2238   LABSPEC 1.020 03/24/2013 1853   PHURINE 5.0 01/29/2021 2238   GLUCOSEU >=500 (A) 01/29/2021 2238   GLUCOSEU Negative 03/24/2013 1853   HGBUR NEGATIVE 01/29/2021 2238   BILIRUBINUR NEGATIVE 01/29/2021 2238   BILIRUBINUR Negative 03/24/2013 1853   KETONESUR NEGATIVE 01/29/2021 2238   PROTEINUR NEGATIVE 01/29/2021 2238   NITRITE NEGATIVE 01/29/2021 2238   LEUKOCYTESUR NEGATIVE 01/29/2021 2238   LEUKOCYTESUR Negative 03/24/2013 1853   Sepsis Labs Invalid input(s): PROCALCITONIN,  WBC,  LACTICIDVEN Microbiology Recent Results (from the past 240 hour(s))  Resp Panel by RT-PCR (Flu A&B, Covid) Nasopharyngeal Swab     Status: None   Collection Time: 01/29/21  7:46 PM   Specimen: Nasopharyngeal Swab; Nasopharyngeal(NP) swabs in vial transport medium  Result Value Ref Range Status   SARS Coronavirus 2 by RT PCR NEGATIVE NEGATIVE Final    Comment: (NOTE) SARS-CoV-2 target nucleic acids are NOT DETECTED.  The SARS-CoV-2 RNA is generally detectable in upper respiratory specimens during the acute phase of infection. The lowest concentration of SARS-CoV-2 viral copies this assay can detect is 138 copies/mL. A negative result does not preclude SARS-Cov-2 infection and should not be used as the sole basis for treatment or other patient management decisions. A negative result may occur with  improper specimen collection/handling, submission of specimen other than nasopharyngeal swab, presence of viral mutation(s) within the areas targeted by this assay, and inadequate number of viral copies(<138 copies/mL). A negative result must be combined with clinical observations, patient history, and epidemiological information. The expected result is Negative.  Fact Sheet for Patients:   EntrepreneurPulse.com.au  Fact Sheet for Healthcare Providers:  IncredibleEmployment.be  This test is no t yet approved or cleared by the [place] FDA and  has been authorized for detection and/or diagnosis of SARS-CoV-2 by FDA under an Emergency Use Authorization (EUA). This EUA will remain  in effect (meaning this test can be used) for the duration of the COVID-19 declaration under Section 564(b)(1) of the Act, 21 U.S.C.section 360bbb-3(b)(1), unless the authorization is terminated  or revoked sooner.       Influenza A by PCR NEGATIVE NEGATIVE Final   Influenza B by PCR NEGATIVE NEGATIVE Final    Comment: (NOTE) The Xpert Xpress SARS-CoV-2/FLU/RSV plus assay is intended as an aid in the diagnosis of influenza from Nasopharyngeal swab specimens and should not be used as a sole basis for treatment. Nasal washings and aspirates are unacceptable for Xpert Xpress SARS-CoV-2/FLU/RSV testing.  Fact Sheet for Patients: EntrepreneurPulse.com.au  Fact Sheet for Healthcare Providers: IncredibleEmployment.be  This test is not yet approved or cleared by the [place] FDA and has been authorized for detection and/or diagnosis of SARS-CoV-2 by FDA under an Emergency Use Authorization (EUA). This EUA will remain in effect (meaning this test can be used) for the duration of the COVID-19 declaration under Section 564(b)(1) of the Act, 21 U.S.C. section 360bbb-3(b)(1), unless the authorization is terminated or revoked.  Performed at [facility], [address] [place], [place] [address]   Blood culture (routine x 2)     Status: None (Preliminary result)   Collection Time: 01/29/21  9:00  PM   Specimen: BLOOD  Result Value Ref Range Status   Specimen Description BLOOD BLOOD RIGHT FOREARM  Final   Special Requests   Final    BOTTLES DRAWN AEROBIC AND ANAEROBIC Blood Culture adequate volume    Culture   Final    NO GROWTH 3 DAYS Performed at [facility], [address], [place], [place] [address]    Report Status PENDING  Incomplete  Blood culture (routine x 2)     Status: None (Preliminary result)   Collection Time: 01/29/21  9:00 PM   Specimen: BLOOD  Result Value Ref Range Status   Specimen Description BLOOD LEFT ANTECUBITAL  Final   Special Requests   Final    BOTTLES DRAWN AEROBIC AND ANAEROBIC Blood Culture adequate volume   Culture   Final    NO GROWTH 3 DAYS Performed at [facility], [address], [place], [place] [address]    Report Status PENDING  Incomplete  Urine culture     Status: None   Collection Time: 01/29/21 10:38 PM   Specimen: In/Out Cath Urine  Result Value Ref Range Status   Specimen Description   Final    IN/OUT CATH URINE Performed at [facility], [address], [place], [place] [address]    Special Requests   Final    NONE Performed at [facility], [address], [place], [place] [address]    Culture   Final    NO GROWTH Performed at [facility], [place] [address], [place], [place] [address]    Report Status 01/31/2021 FINAL  Final  MRSA PCR Screening     Status: Abnormal   Collection Time: 01/30/21  3:03 PM   Specimen: Nasopharyngeal  Result Value Ref Range Status   MRSA by PCR POSITIVE (A) NEGATIVE Final    Comment:        The GeneXpert MRSA Assay (FDA approved for NASAL specimens only), is one component of a comprehensive MRSA colonization surveillance program. It is not intended to diagnose MRSA infection nor to guide or monitor treatment for MRSA infections. RESULT CALLED TO, READ BACK BY AND VERIFIED WITH: ASHLEY HARRIS AT 1216 ON 01/30/21 BY SS Performed at [facility], [address] [place], [place] [address]      Time coordinating discharge: Over 30 minutes  SIGNED:   Shawna Clamp, MD  [facility] 02/01/2021, 10:48 AM Pager   If  7PM-7AM, please contact night-coverage [email or web address]

## 2021-02-01 NOTE — Plan of Care (Signed)
  Problem: Education: Goal: Knowledge of General Education information will improve Description: Including pain rating scale, medication(s)/side effects and non-pharmacologic comfort measures 02/01/2021 0233 by Bonner Puna, RN Outcome: Progressing 02/01/2021 0233 by Bonner Puna, RN Outcome: Progressing   Problem: Health Behavior/Discharge Planning: Goal: Ability to manage health-related needs will improve 02/01/2021 0233 by Bonner Puna, RN Outcome: Progressing 02/01/2021 0233 by Bonner Puna, RN Outcome: Progressing   Problem: Clinical Measurements: Goal: Ability to maintain clinical measurements within normal limits will improve 02/01/2021 0233 by Bonner Puna, RN Outcome: Progressing 02/01/2021 0233 by Bonner Puna, RN Outcome: Progressing Goal: Will remain free from infection 02/01/2021 0233 by Bonner Puna, RN Outcome: Progressing 02/01/2021 0233 by Bonner Puna, RN Outcome: Progressing Goal: Diagnostic test results will improve 02/01/2021 0233 by Bonner Puna, RN Outcome: Progressing 02/01/2021 0233 by Bonner Puna, RN Outcome: Progressing Goal: Respiratory complications will improve 02/01/2021 0233 by Bonner Puna, RN Outcome: Progressing 02/01/2021 0233 by Bonner Puna, RN Outcome: Progressing Goal: Cardiovascular complication will be avoided 02/01/2021 0233 by Bonner Puna, RN Outcome: Progressing 02/01/2021 0233 by Bonner Puna, RN Outcome: Progressing   Problem: Activity: Goal: Risk for activity intolerance will decrease 02/01/2021 0233 by Bonner Puna, RN Outcome: Progressing 02/01/2021 0233 by Bonner Puna, RN Outcome: Progressing   Problem: Nutrition: Goal: Adequate nutrition will be maintained 02/01/2021 0233 by Bonner Puna, RN Outcome: Progressing 02/01/2021 0233 by Bonner Puna, RN Outcome: Progressing   Problem: Coping: Goal: Level of anxiety will decrease 02/01/2021 0233 by Bonner Puna, RN Outcome: Progressing 02/01/2021 0233 by Bonner Puna, RN Outcome: Progressing    Problem: Elimination: Goal: Will not experience complications related to bowel motility 02/01/2021 0233 by Bonner Puna, RN Outcome: Progressing 02/01/2021 0233 by Bonner Puna, RN Outcome: Progressing Goal: Will not experience complications related to urinary retention 02/01/2021 0233 by Bonner Puna, RN Outcome: Progressing 02/01/2021 0233 by Bonner Puna, RN Outcome: Progressing   Problem: Pain Managment: Goal: General experience of comfort will improve 02/01/2021 0233 by Bonner Puna, RN Outcome: Progressing 02/01/2021 0233 by Bonner Puna, RN Outcome: Progressing   Problem: Safety: Goal: Ability to remain free from injury will improve 02/01/2021 0233 by Bonner Puna, RN Outcome: Progressing 02/01/2021 0233 by Bonner Puna, RN Outcome: Progressing   Problem: Skin Integrity: Goal: Risk for impaired skin integrity will decrease 02/01/2021 0233 by Bonner Puna, RN Outcome: Progressing 02/01/2021 0233 by Bonner Puna, RN Outcome: Progressing

## 2021-02-03 LAB — CULTURE, BLOOD (ROUTINE X 2)
Culture: NO GROWTH
Culture: NO GROWTH
Special Requests: ADEQUATE
Special Requests: ADEQUATE

## 2021-02-07 LAB — BLOOD GAS, VENOUS
Acid-Base Excess: 6.9 mmol/L — ABNORMAL HIGH (ref 0.0–2.0)
Bicarbonate: 34.3 mmol/L — ABNORMAL HIGH (ref 20.0–28.0)
O2 Saturation: 54.2 %
Patient temperature: 37
pCO2, Ven: 65 mmHg — ABNORMAL HIGH (ref 44.0–60.0)
pH, Ven: 7.33 (ref 7.250–7.430)
pO2, Ven: 31 mmHg — CL (ref 32.0–45.0)

## 2021-02-21 ENCOUNTER — Other Ambulatory Visit: Payer: Self-pay

## 2021-02-21 ENCOUNTER — Ambulatory Visit
Admission: RE | Admit: 2021-02-21 | Discharge: 2021-02-21 | Disposition: A | Discharge status: Home / Self Care | Payer: Medicare HMO | Source: Ambulatory Visit | Attending: Oncology | Admitting: Oncology

## 2021-02-21 DIAGNOSIS — R911 Solitary pulmonary nodule: Secondary | ICD-10-CM | POA: Insufficient documentation

## 2021-02-26 ENCOUNTER — Inpatient Hospital Stay
Admission: EM | Admit: 2021-02-26 | Discharge: 2021-03-09 | DRG: 871 | Disposition: A | Discharge status: Home Health | Payer: Medicare HMO | Attending: Student | Admitting: Student

## 2021-02-26 ENCOUNTER — Other Ambulatory Visit: Payer: Self-pay

## 2021-02-26 ENCOUNTER — Emergency Department: Payer: Medicare HMO

## 2021-02-26 DIAGNOSIS — Z7951 Long term (current) use of inhaled steroids: Secondary | ICD-10-CM | POA: Diagnosis not present

## 2021-02-26 DIAGNOSIS — F32A Depression, unspecified: Secondary | ICD-10-CM | POA: Diagnosis present

## 2021-02-26 DIAGNOSIS — R0902 Hypoxemia: Secondary | ICD-10-CM

## 2021-02-26 DIAGNOSIS — I5033 Acute on chronic diastolic (congestive) heart failure: Secondary | ICD-10-CM | POA: Diagnosis present

## 2021-02-26 DIAGNOSIS — I714 Abdominal aortic aneurysm, without rupture: Secondary | ICD-10-CM | POA: Diagnosis present

## 2021-02-26 DIAGNOSIS — Z923 Personal history of irradiation: Secondary | ICD-10-CM

## 2021-02-26 DIAGNOSIS — J189 Pneumonia, unspecified organism: Secondary | ICD-10-CM | POA: Diagnosis present

## 2021-02-26 DIAGNOSIS — J432 Centrilobular emphysema: Secondary | ICD-10-CM | POA: Diagnosis present

## 2021-02-26 DIAGNOSIS — Z7983 Long term (current) use of bisphosphonates: Secondary | ICD-10-CM | POA: Diagnosis not present

## 2021-02-26 DIAGNOSIS — J690 Pneumonitis due to inhalation of food and vomit: Secondary | ICD-10-CM | POA: Diagnosis present

## 2021-02-26 DIAGNOSIS — K219 Gastro-esophageal reflux disease without esophagitis: Secondary | ICD-10-CM | POA: Diagnosis present

## 2021-02-26 DIAGNOSIS — J9601 Acute respiratory failure with hypoxia: Secondary | ICD-10-CM

## 2021-02-26 DIAGNOSIS — Z87891 Personal history of nicotine dependence: Secondary | ICD-10-CM | POA: Diagnosis not present

## 2021-02-26 DIAGNOSIS — I110 Hypertensive heart disease with heart failure: Secondary | ICD-10-CM | POA: Diagnosis present

## 2021-02-26 DIAGNOSIS — Z86718 Personal history of other venous thrombosis and embolism: Secondary | ICD-10-CM | POA: Diagnosis not present

## 2021-02-26 DIAGNOSIS — R6521 Severe sepsis with septic shock: Secondary | ICD-10-CM | POA: Diagnosis present

## 2021-02-26 DIAGNOSIS — Z85118 Personal history of other malignant neoplasm of bronchus and lung: Secondary | ICD-10-CM

## 2021-02-26 DIAGNOSIS — A419 Sepsis, unspecified organism: Principal | ICD-10-CM | POA: Diagnosis present

## 2021-02-26 DIAGNOSIS — Z8616 Personal history of COVID-19: Secondary | ICD-10-CM

## 2021-02-26 DIAGNOSIS — J441 Chronic obstructive pulmonary disease with (acute) exacerbation: Secondary | ICD-10-CM | POA: Diagnosis present

## 2021-02-26 DIAGNOSIS — Z8673 Personal history of transient ischemic attack (TIA), and cerebral infarction without residual deficits: Secondary | ICD-10-CM

## 2021-02-26 DIAGNOSIS — Z9981 Dependence on supplemental oxygen: Secondary | ICD-10-CM

## 2021-02-26 DIAGNOSIS — N179 Acute kidney failure, unspecified: Secondary | ICD-10-CM | POA: Diagnosis present

## 2021-02-26 DIAGNOSIS — R5381 Other malaise: Secondary | ICD-10-CM | POA: Diagnosis present

## 2021-02-26 DIAGNOSIS — Z79899 Other long term (current) drug therapy: Secondary | ICD-10-CM | POA: Diagnosis not present

## 2021-02-26 DIAGNOSIS — Z515 Encounter for palliative care: Secondary | ICD-10-CM | POA: Diagnosis not present

## 2021-02-26 DIAGNOSIS — G8929 Other chronic pain: Secondary | ICD-10-CM | POA: Diagnosis present

## 2021-02-26 DIAGNOSIS — J9621 Acute and chronic respiratory failure with hypoxia: Secondary | ICD-10-CM | POA: Diagnosis present

## 2021-02-26 DIAGNOSIS — Z7189 Other specified counseling: Secondary | ICD-10-CM | POA: Diagnosis not present

## 2021-02-26 DIAGNOSIS — Z7982 Long term (current) use of aspirin: Secondary | ICD-10-CM

## 2021-02-26 DIAGNOSIS — Z7902 Long term (current) use of antithrombotics/antiplatelets: Secondary | ICD-10-CM

## 2021-02-26 DIAGNOSIS — I428 Other cardiomyopathies: Secondary | ICD-10-CM | POA: Diagnosis not present

## 2021-02-26 DIAGNOSIS — E872 Acidosis: Secondary | ICD-10-CM | POA: Diagnosis present

## 2021-02-26 DIAGNOSIS — R1310 Dysphagia, unspecified: Secondary | ICD-10-CM | POA: Diagnosis present

## 2021-02-26 DIAGNOSIS — M109 Gout, unspecified: Secondary | ICD-10-CM | POA: Diagnosis present

## 2021-02-26 DIAGNOSIS — E1165 Type 2 diabetes mellitus with hyperglycemia: Secondary | ICD-10-CM | POA: Diagnosis not present

## 2021-02-26 DIAGNOSIS — J9622 Acute and chronic respiratory failure with hypercapnia: Secondary | ICD-10-CM | POA: Diagnosis present

## 2021-02-26 DIAGNOSIS — Z85828 Personal history of other malignant neoplasm of skin: Secondary | ICD-10-CM

## 2021-02-26 DIAGNOSIS — E876 Hypokalemia: Secondary | ICD-10-CM | POA: Diagnosis present

## 2021-02-26 DIAGNOSIS — J9600 Acute respiratory failure, unspecified whether with hypoxia or hypercapnia: Secondary | ICD-10-CM

## 2021-02-26 DIAGNOSIS — E785 Hyperlipidemia, unspecified: Secondary | ICD-10-CM | POA: Diagnosis present

## 2021-02-26 DIAGNOSIS — F419 Anxiety disorder, unspecified: Secondary | ICD-10-CM | POA: Diagnosis present

## 2021-02-26 DIAGNOSIS — E1140 Type 2 diabetes mellitus with diabetic neuropathy, unspecified: Secondary | ICD-10-CM | POA: Diagnosis present

## 2021-02-26 DIAGNOSIS — M549 Dorsalgia, unspecified: Secondary | ICD-10-CM | POA: Diagnosis present

## 2021-02-26 LAB — CBC WITH DIFFERENTIAL/PLATELET
Abs Immature Granulocytes: 0.13 10*3/uL — ABNORMAL HIGH (ref 0.00–0.07)
Basophils Absolute: 0.1 10*3/uL (ref 0.0–0.1)
Basophils Relative: 1 %
Eosinophils Absolute: 0 10*3/uL (ref 0.0–0.5)
Eosinophils Relative: 0 %
HCT: 42.7 % (ref 39.0–52.0)
Hemoglobin: 13.1 g/dL (ref 13.0–17.0)
Immature Granulocytes: 1 %
Lymphocytes Relative: 4 %
Lymphs Abs: 0.8 10*3/uL (ref 0.7–4.0)
MCH: 29.8 pg (ref 26.0–34.0)
MCHC: 30.7 g/dL (ref 30.0–36.0)
MCV: 97 fL (ref 80.0–100.0)
Monocytes Absolute: 0.5 10*3/uL (ref 0.1–1.0)
Monocytes Relative: 2 %
Neutro Abs: 17.8 10*3/uL — ABNORMAL HIGH (ref 1.7–7.7)
Neutrophils Relative %: 92 %
Platelets: 275 10*3/uL (ref 150–400)
RBC: 4.4 MIL/uL (ref 4.22–5.81)
RDW: 15 % (ref 11.5–15.5)
Smear Review: NORMAL
WBC Morphology: INCREASED
WBC: 19.1 10*3/uL — ABNORMAL HIGH (ref 4.0–10.5)
nRBC: 2.4 % — ABNORMAL HIGH (ref 0.0–0.2)

## 2021-02-26 LAB — COMPREHENSIVE METABOLIC PANEL
ALT: 11 U/L (ref 0–44)
AST: 27 U/L (ref 15–41)
Albumin: 3.5 g/dL (ref 3.5–5.0)
Alkaline Phosphatase: 59 U/L (ref 38–126)
Anion gap: 15 (ref 5–15)
BUN: 35 mg/dL — ABNORMAL HIGH (ref 8–23)
CO2: 21 mmol/L — ABNORMAL LOW (ref 22–32)
Calcium: 8.8 mg/dL — ABNORMAL LOW (ref 8.9–10.3)
Chloride: 104 mmol/L (ref 98–111)
Creatinine, Ser: 3.05 mg/dL — ABNORMAL HIGH (ref 0.61–1.24)
GFR, Estimated: 22 mL/min — ABNORMAL LOW (ref >60)
Glucose, Bld: 115 mg/dL — ABNORMAL HIGH (ref 70–99)
Potassium: 2.9 mmol/L — ABNORMAL LOW (ref 3.5–5.1)
Sodium: 140 mmol/L (ref 135–145)
Total Bilirubin: 0.5 mg/dL (ref 0.3–1.2)
Total Protein: 6.8 g/dL (ref 6.5–8.1)

## 2021-02-26 LAB — BLOOD GAS, VENOUS
Acid-base deficit: 8 mmol/L — ABNORMAL HIGH (ref 0.0–2.0)
Bicarbonate: 21.6 mmol/L (ref 20.0–28.0)
Delivery systems: POSITIVE
FIO2: 1
O2 Saturation: 29.6 %
Patient temperature: 37
pCO2, Ven: 62 mmHg — ABNORMAL HIGH (ref 44.0–60.0)
pH, Ven: 7.15 — CL (ref 7.250–7.430)
pO2, Ven: <31 mmHg — CL (ref 32.0–45.0)

## 2021-02-26 LAB — PROCALCITONIN: Procalcitonin: 99.28 ng/mL

## 2021-02-26 LAB — RESP PANEL BY RT-PCR (FLU A&B, COVID) ARPGX2
Influenza A by PCR: NEGATIVE
Influenza B by PCR: NEGATIVE
SARS Coronavirus 2 by RT PCR: NEGATIVE

## 2021-02-26 LAB — LACTIC ACID, PLASMA
Lactic Acid, Venous: 7.4 mmol/L (ref 0.5–1.9)
Lactic Acid, Venous: 6.2 mmol/L (ref 0.5–1.9)

## 2021-02-26 LAB — BRAIN NATRIURETIC PEPTIDE: B Natriuretic Peptide: 1149.5 pg/mL — ABNORMAL HIGH (ref 0.0–100.0)

## 2021-02-26 LAB — TROPONIN I (HIGH SENSITIVITY): Troponin I (High Sensitivity): 50 ng/L — ABNORMAL HIGH (ref <18)

## 2021-02-26 MED ORDER — POLYETHYLENE GLYCOL 3350 17 G PO PACK: 17.0000 g | PACK | Freq: Every day | ORAL | Status: DC | PRN

## 2021-02-26 MED ORDER — SODIUM CHLORIDE 0.9 % IV SOLN
500.0000 mg | Freq: Once | INTRAVENOUS | Status: AC
  Administered 2021-02-26: 500 mg via INTRAVENOUS
  Filled 2021-02-26 (×2): qty 500

## 2021-02-26 MED ORDER — LACTATED RINGERS IV BOLUS
500.0000 mL | Freq: Once | INTRAVENOUS | Status: AC
  Administered 2021-02-26: 500 mL via INTRAVENOUS

## 2021-02-26 MED ORDER — VANCOMYCIN HCL 750 MG/150ML IV SOLN
750.0000 mg | Freq: Once | INTRAVENOUS | Status: AC
  Administered 2021-02-26: 750 mg via INTRAVENOUS
  Filled 2021-02-26: qty 150

## 2021-02-26 MED ORDER — VANCOMYCIN HCL IN DEXTROSE 1-5 GM/200ML-% IV SOLN: 1000.0000 mg | Freq: Once | INTRAVENOUS | Status: DC

## 2021-02-26 MED ORDER — LACTATED RINGERS IV BOLUS
1000.0000 mL | Freq: Once | INTRAVENOUS | Status: AC
  Administered 2021-02-26: 1000 mL via INTRAVENOUS

## 2021-02-26 MED ORDER — BUDESONIDE 0.25 MG/2ML IN SUSP
0.2500 mg | Freq: Two times a day (BID) | RESPIRATORY_TRACT | Status: DC
  Administered 2021-02-27 – 2021-03-09 (×22): 0.25 mg via RESPIRATORY_TRACT
  Filled 2021-02-26 (×22): qty 2

## 2021-02-26 MED ORDER — NOREPINEPHRINE 4 MG/250ML-% IV SOLN
0.0000 ug/min | INTRAVENOUS | Status: DC
  Administered 2021-02-26: 2 ug/min via INTRAVENOUS
  Administered 2021-02-27: 20 ug/min via INTRAVENOUS
  Administered 2021-02-27: 16 ug/min via INTRAVENOUS
  Filled 2021-02-26 (×3): qty 250

## 2021-02-26 MED ORDER — POTASSIUM CHLORIDE CRYS ER 20 MEQ PO TBCR
40.0000 meq | EXTENDED_RELEASE_TABLET | Freq: Once | ORAL | Status: AC
  Administered 2021-02-27: 40 meq via ORAL
  Filled 2021-02-26: qty 2

## 2021-02-26 MED ORDER — METHYLPREDNISOLONE SODIUM SUCC 125 MG IJ SOLR
125.0000 mg | Freq: Once | INTRAMUSCULAR | Status: AC
  Administered 2021-02-26: 125 mg via INTRAVENOUS
  Filled 2021-02-26: qty 2

## 2021-02-26 MED ORDER — VANCOMYCIN HCL IN DEXTROSE 1-5 GM/200ML-% IV SOLN
1000.0000 mg | Freq: Once | INTRAVENOUS | Status: AC
  Administered 2021-02-26: 1000 mg via INTRAVENOUS
  Filled 2021-02-26: qty 200

## 2021-02-26 MED ORDER — POTASSIUM CHLORIDE 10 MEQ/100ML IV SOLN
10.0000 meq | INTRAVENOUS | Status: AC
Start: 2021-02-26 — End: 2021-02-27
  Administered 2021-02-27: 10 meq via INTRAVENOUS
  Filled 2021-02-26: qty 100

## 2021-02-26 MED ORDER — SODIUM CHLORIDE 0.9 % IV BOLUS
1000.0000 mL | Freq: Once | INTRAVENOUS | Status: AC
  Administered 2021-02-26: 1000 mL via INTRAVENOUS

## 2021-02-26 MED ORDER — DOCUSATE SODIUM 100 MG PO CAPS: 100.0000 mg | ORAL_CAPSULE | Freq: Two times a day (BID) | ORAL | Status: DC | PRN

## 2021-02-26 MED ORDER — IPRATROPIUM-ALBUTEROL 0.5-2.5 (3) MG/3ML IN SOLN
3.0000 mL | Freq: Once | RESPIRATORY_TRACT | Status: AC
  Administered 2021-02-26: 3 mL via RESPIRATORY_TRACT
  Filled 2021-02-26: qty 3

## 2021-02-26 MED ORDER — METHYLPREDNISOLONE SODIUM SUCC 40 MG IJ SOLR: 40.0000 mg | Freq: Two times a day (BID) | INTRAMUSCULAR | Status: DC

## 2021-02-26 MED ORDER — CHLORHEXIDINE GLUCONATE CLOTH 2 % EX PADS
6.0000 | MEDICATED_PAD | Freq: Every day | CUTANEOUS | Status: DC
  Administered 2021-03-01 – 2021-03-08 (×7): 6 via TOPICAL
  Filled 2021-02-26: qty 6

## 2021-02-26 MED ORDER — HEPARIN SODIUM (PORCINE) 5000 UNIT/ML IJ SOLN
5000.0000 [IU] | Freq: Three times a day (TID) | INTRAMUSCULAR | Status: DC
  Administered 2021-02-26 – 2021-03-09 (×32): 5000 [IU] via SUBCUTANEOUS
  Filled 2021-02-26 (×32): qty 1

## 2021-02-26 MED ORDER — SODIUM CHLORIDE 0.9 % IV SOLN
2.0000 g | Freq: Once | INTRAVENOUS | Status: AC
  Administered 2021-02-26: 2 g via INTRAVENOUS
  Filled 2021-02-26: qty 2

## 2021-02-26 MED ORDER — IPRATROPIUM-ALBUTEROL 0.5-2.5 (3) MG/3ML IN SOLN
3.0000 mL | Freq: Four times a day (QID) | RESPIRATORY_TRACT | Status: DC
  Administered 2021-02-27 – 2021-03-09 (×38): 3 mL via RESPIRATORY_TRACT
  Filled 2021-02-26 (×9): qty 3
  Filled 2021-02-26: qty 39
  Filled 2021-02-26 (×27): qty 3

## 2021-02-26 MED ORDER — PANTOPRAZOLE SODIUM 40 MG IV SOLR
40.0000 mg | Freq: Every day | INTRAVENOUS | Status: DC
  Administered 2021-02-26 – 2021-03-02 (×5): 40 mg via INTRAVENOUS
  Filled 2021-02-26 (×5): qty 40

## 2021-02-26 MED ORDER — SODIUM CHLORIDE 0.9 % IV SOLN
500.0000 mg | INTRAVENOUS | Status: DC
  Filled 2021-02-26: qty 500

## 2021-02-26 NOTE — ED Provider Notes (Signed)
[facility] Emergency Department Provider Note  ____________________________________________   Event Date/Time   First MD Initiated Contact with Patient 02/26/21 1907     (approximate)  I have reviewed the triage vital signs and the nursing notes.   HISTORY  Chief Complaint Shortness of Breath    HPI Christian Fields is a 65 y.o. male with COPD on 2 L, diabetes, prior left upper lobe lung cancer status post radiation, chronic back pain on Suboxone who comes in with shortness of breath.  On EMS arrival patient was not able to obtain saturations.  Patient was placed on a nonrebreather and sats were in the 80s.  Patient reports shortness of breath but I am unable to get full review of system or HPI due to severity of respiratory distress.  He does report being around people with Covid.  Patient was given 2 duo nebs with EMS.          Past Medical History:  Diagnosis Date  . Anemia   . Anxiety   . Arthritis   . Asthma   . Cancer ([place])    Basal Cell Skin Cancer  . Chronic back pain   . COPD (chronic obstructive pulmonary disease) ([place])   . Depression   . Diabetes mellitus ([place])   . Dyspnea   . GERD (gastroesophageal reflux disease)   . Gout   . Gout   . Headache   . History of blood clots    Left Leg--July 2018  . History of kidney stones   . Hyperlipidemia   . Hyperlipidemia   . Hypertension   . Kidney stones   . Neuropathy   . On home oxygen therapy    2 L / M  . Pneumonia 06/2017  . Sleep apnea   . Ulcer of foot [facility])    Right    Patient Active Problem List   Diagnosis Date Noted  . Polysubstance (excluding opioids) dependence ([place]) 01/30/2021  . Acute metabolic encephalopathy 19/41/7408  . COPD with acute exacerbation ([place]) 01/29/2021  . Iron deficiency anemia 11/27/2020  . Severe sepsis ([place])   . NSCLC of left lung ([place]) 11/12/2020  . Left leg swelling 04/28/2020  . Confusion   . Acute on chronic respiratory failure with  hypoxia ([place]) 03/26/2020  . Pneumonia of both lower lobes due to infectious organism 03/25/2020  . AKI (acute kidney injury) ([place]) 01/13/2020  . Cellulitis of left leg 01/12/2020  . COVID-19 virus detected 01/12/2020  . Diabetic ulcer of left midfoot associated with type 2 diabetes mellitus, with fat layer exposed ([place]) 01/12/2020  . Panlobular emphysema ([place]) 01/12/2020  . Arthritis 10/29/2019  . Hemorrhoids 10/29/2019  . Senile nuclear sclerosis, bilateral 02/15/2019  . Cellulitis of left upper limb 02/11/2019  . AAA (abdominal aortic aneurysm) without rupture ([place]) 01/15/2019  . Localized swelling, mass and lump, upper limb 12/24/2018  . Pain in femur 12/24/2018  . TIA (transient ischemic attack) 11/18/2018  . Cortical age-related cataract of both eyes 11/02/2018  . Fracture of multiple ribs 04/20/2018  . Overweight (BMI 25.0-29.9) 04/20/2018  . Hypertension 07/08/2017  . Diabetes mellitus ([place]) 07/08/2017  . Hyperlipidemia 07/08/2017  . Tobacco use disorder 07/08/2017  . Atherosclerosis of native arteries of extremity with intermittent claudication ([place]) 07/08/2017  . SOB (shortness of breath) 06/19/2017  . Acute on chronic respiratory failure with hypoxia and hypercapnia ([place]) 12/15/2016  . CAP (community acquired pneumonia) 12/15/2016  . Chronic pain 12/15/2016  . Chronically on opiate  therapy 12/15/2016  . History of kidney stones 12/15/2016  . Polypharmacy 12/15/2016  . Somnolence 12/15/2016  . Foot ulcer ([place]) 01/19/2016  . 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 ([place]) 18/56/3149  . Other psychoactive substance use, unspecified with withdrawal, uncomplicated ([place]) 70/26/3785  . Type 2 diabetes mellitus with foot ulcer (CODE) ([place]) 11/27/2015  . Chronic obstructive pulmonary disease, unspecified ([place]) 01/18/2013  . Depressive disorder 01/18/2013  . Hereditary and idiopathic peripheral neuropathy 01/18/2013  . Sleep apnea 01/18/2013  . Low back pain 11/23/2010  .  Acute gouty arthropathy 07/10/2010  . Problem related to lifestyle 10/29/2004    Past Surgical History:  Procedure Laterality Date  . APPENDECTOMY    . DG FEET 2 VIEWS BILAT    . LIPOMA EXCISION Right 08/15/2017   Procedure: EXCISION TUMOR(CYST) FOOT;  Surgeon: Albertine Patricia, DPM;  Location: [facility] ORS;  Service: Podiatry;  Laterality: Right;  . OTHER SURGICAL HISTORY Bilateral Foot surgery    Prior to Admission medications   Medication Sig Start Date End Date Taking? Authorizing Provider  albuterol (VENTOLIN HFA) 108 (90 Base) MCG/ACT inhaler Inhale 2 puffs into the lungs every 4 (four) hours as needed for wheezing or shortness of breath.    [provider]  alendronate (FOSAMAX) 70 MG tablet Take 70 mg by mouth once a week.  06/23/17   [provider]  allopurinol (ZYLOPRIM) 300 MG tablet Take 600 mg by mouth 2 (two) times daily. 02/24/20   [provider]  aspirin EC 81 MG tablet Take 1 tablet by mouth daily.    [provider]  buprenorphine-naloxone (SUBOXONE) 8-2 mg SUBL SL tablet Place 1 tablet under the tongue 3 (three) times daily.    [provider]  cholecalciferol (VITAMIN D) 1000 units tablet Take 1,000 Units by mouth daily.    [provider]  clopidogrel (PLAVIX) 75 MG tablet Take 1 tablet (75 mg total) by mouth daily. 02/02/21   Shawna Clamp, MD  DULoxetine (CYMBALTA) 30 MG capsule Take 1 capsule by mouth daily. 01/17/16   [provider]  ezetimibe (ZETIA) 10 MG tablet Take 10 mg by mouth daily. 02/24/20   [provider]  FARXIGA 10 MG TABS tablet Take 10 mg by mouth daily. 09/14/20   [provider]  ferrous sulfate 325 (65 FE) MG tablet Take 325 mg by mouth daily with breakfast.    [provider]  fluticasone (FLONASE) 50 MCG/ACT nasal spray Place 2 sprays into both nostrils daily. 12/18/15   [provider]  gabapentin (NEURONTIN) 800 MG tablet Take 800 mg by mouth 4 (four)  times daily. 12/18/15   [provider]  hydrochlorothiazide (HYDRODIURIL) 12.5 MG tablet Take 1 tablet (12.5 mg total) by mouth daily. Patient taking differently: Take 25 mg by mouth daily. 11/18/20   Nolberto Hanlon, MD  ipratropium (ATROVENT) 0.02 % nebulizer solution Inhale 2.5 mL (500 mcg total) by nebulization every 6 (six) hours. 12/16/16 01/15/19  [provider]  KOMBIGLYZE XR 2.04-999 MG TB24 Take 2 tablets by mouth daily. 01/17/16   [provider]  metoprolol succinate (TOPROL-XL) 50 MG 24 hr tablet Take 50 mg by mouth daily. 02/24/20   [provider]  ondansetron (ZOFRAN) 4 MG tablet Take 4 mg by mouth every 4 (four) hours as needed for nausea or vomiting. 10/25/20   [provider]  OXYGEN Inhale 2 L into the lungs.    [provider]  pantoprazole (PROTONIX) 40 MG tablet Take 40 mg by mouth  daily. 12/18/15   [provider]  simvastatin (ZOCOR) 40 MG tablet Take 1 tablet by mouth daily. 01/17/16   [provider]  TRELEGY ELLIPTA 100-62.5-25 MCG/INH AEPB Inhale 1 puff into the lungs daily. 12/31/19   [provider]    Allergies Bee venom and Hydrocodone-acetaminophen  Family History  Problem Relation Age of Onset  . Hypertension Mother     Social History Social History   Tobacco Use  . Smoking status: Former Smoker    Packs/day: 0.50    Types: Cigarettes    Start date: 2018  . Smokeless tobacco: Never Used  Vaping Use  . Vaping Use: Never used  Substance Use Topics  . Alcohol use: Yes    Alcohol/week: 1.0 standard drink    Types: 1 Cans of beer per week    Comment: occ  . Drug use: No      Review of Systems Unable to get full review of systems due to patient's respiratory distress ____________________________________________   PHYSICAL EXAM:  VITAL SIGNS: ED Triage Vitals  Enc Vitals Group     BP 02/26/21 1902 (!) 130/116     Pulse Rate 02/26/21 1902 (!) 138     Resp --      Temp  02/26/21 1902 99.1 F (37.3 C)     Temp Source 02/26/21 1902 Axillary     SpO2 02/26/21 1901 (!) 80 %     Weight 02/26/21 1903 164 lb (74.4 kg)     Height 02/26/21 1903 5\' 9"  (1.753 m)     Head Circumference --      Peak Flow --      Pain Score 02/26/21 1903 0     Pain Loc --      Pain Edu? --      Excl. in [place]? --     Constitutional: Alert and oriented.  In obvious distress Eyes: Conjunctivae are normal. EOMI. Head: Atraumatic. Nose: No congestion/rhinnorhea. Mouth/Throat: Mucous membranes are moist.   Neck: No stridor. Trachea Midline. FROM Cardiovascular: Normal rate, regular rhythm. Grossly normal heart sounds.  Good peripheral circulation. Respiratory: Tight lung sounds, increased work of breathing Gastrointestinal: Soft and nontender. No distention. No abdominal bruits.  Musculoskeletal: No lower extremity tenderness nor edema.  No joint effusions. Neurologic:  Normal speech and language. No gross focal neurologic deficits are appreciated.  Skin:  Skin is warm, dry and intact. No rash noted. Psychiatric: Mood and affect are normal. Speech and behavior are normal. GU: Deferred   ____________________________________________   LABS (all labs ordered are listed, but only abnormal results are displayed)  Labs Reviewed  RESP PANEL BY RT-PCR (FLU A&B, COVID) ARPGX2  CULTURE, BLOOD (ROUTINE X 2)  CULTURE, BLOOD (ROUTINE X 2)  CBC WITH DIFFERENTIAL/PLATELET  COMPREHENSIVE METABOLIC PANEL  BRAIN NATRIURETIC PEPTIDE  LACTIC ACID, PLASMA  LACTIC ACID, PLASMA  BLOOD GAS, VENOUS  URINE DRUG SCREEN, QUALITATIVE ([facility] ONLY)  TROPONIN I (HIGH SENSITIVITY)   ____________________________________________   ED ECG REPORT I, Vanessa [place], the attending physician, personally viewed and interpreted this ECG.  Sinus tachycardia rate of 139, no ST elevation, no T wave inversions, wandering baseline, QTC prolonged ____________________________________________  RADIOLOGY Robert Bellow, personally viewed and evaluated these images (plain radiographs) as part of my medical decision making, as well as reviewing the written report by the radiologist.  ED MD interpretation: Right lower lobe pneumonia  Official radiology report(s): DG Chest Portable 1 View  Result Date: 02/26/2021 CLINICAL DATA:  65 year old male with shortness of breath. EXAM: PORTABLE CHEST 1 VIEW COMPARISON:  Chest radiograph dated 01/29/2021 and CT dated 02/21/2021. FINDINGS: Background of emphysema. A 14 mm faintly visualized nodular focus in the left mid lung field may correspond to the nodule seen on the prior CT. There is diffuse chronic interstitial coarsening and bronchitic changes. There is increased interstitial markings and nodularity involving the right mid to lower lung field most consistent with superimposed pneumonia. Clinical correlation and follow-up to resolution recommended. No pleural effusion pneumothorax. The cardiac silhouette is within limits. Atherosclerotic calcification of the aorta. No acute osseous pathology. IMPRESSION: 1. Right mid to lower lung field pneumonia. Clinical correlation and follow-up to resolution recommended. 2. Emphysema. Electronically Signed   By: Anner Crete M.D.   On: 02/26/2021 19:38    ____________________________________________   PROCEDURES  Procedure(s) performed (including Critical Care):  .1-3 Lead EKG Interpretation Performed by: Vanessa [place], MD Authorized by: Vanessa [place], MD     Interpretation: abnormal     ECG rate:  130s   ECG rate assessment: tachycardic     Ectopy: none     Conduction: normal   .Critical Care Performed by: Vanessa [place], MD Authorized by: Vanessa [place], MD   Critical care provider statement:    Critical care time (minutes):  45   Critical care was necessary to treat or prevent imminent or life-threatening deterioration of the following conditions:  Respiratory failure   Critical care was time spent  personally by me on the following activities:  Discussions with consultants, evaluation of patient's response to treatment, examination of patient, ordering and performing treatments and interventions, ordering and review of laboratory studies, ordering and review of radiographic studies, pulse oximetry, re-evaluation of patient's condition, obtaining history from patient or surrogate and review of old charts Ultrasound ED Peripheral IV (Provider)  Date/Time: 02/26/2021 9:50 PM Performed by: Vanessa [place], MD Authorized by: Vanessa [place], MD   Procedure details:    Indications: hypotension     Skin Prep: chlorhexidine gluconate     Location:  Left AC   Angiocath:  18 G   Bedside Ultrasound Guided: Yes     Images: not archived     Patient tolerated procedure without complications: No     Dressing applied: No       ____________________________________________   INITIAL IMPRESSION / ASSESSMENT AND PLAN / ED COURSE   Genella Mech was evaluated in Emergency Department on 02/26/2021 for the symptoms described in the history of present illness. He was evaluated in the context of the global COVID-19 pandemic, which necessitated consideration that the patient might be at risk for infection with the SARS-CoV-2 virus that causes COVID-19. Institutional protocols and algorithms that pertain to the evaluation of patients at risk for COVID-19 are in a state of rapid change based on information released by regulatory bodies including the CDC and federal and state organizations. These policies and algorithms were followed during the patient's care in the ED.     Pt presents with SOB.  Patient in respiratory distress.  Patient in the 80s on nonrebreather.  Patient was placed on BiPAP.  We will keep patient on cardiac monitor due to the above.  We will give a additional DuoNeb and steroids.  Patient has high risk for decompensation requiring intubation. Suspect that this is most likely related to COPD  versus COVID  PNA-will get xray to evaluation Anemia-CBC to evaluate ACS- will get trops Arrhythmia-Will get EKG and  keep on monitor.  COVID- will get testing per algorithm. PE-lower suspicion given no risk factors and other cause more likely given patient's procalcitonin is elevated I suspect this is more likely related to infection  7:43 PM PCO2 of 62  X-ray concerning for lower lobe pneumonia.  Will start on antibiotics  Patient's kidney function significantly elevated lactate was over 7.  Patient given full 30 cc/kg resuscitation.  Patient has mentating well the entire time however his maps were around 50-60 therefore patient was placed on Levophed on my repeat evaluation.  Patient is on low-dose Levophed at 2 however patient's maps are just at 17.  Broad-spectrum antibiotics have been started.  From a respiratory spot status patient still on BiPAP looking much more comfortable and oxygen levels have been 99%   I discussed with the ICU team for admission.           ____________________________________________   FINAL CLINICAL IMPRESSION(S) / ED DIAGNOSES   Final diagnoses:  Acute respiratory failure with hypoxia ([place])  Community acquired pneumonia of right lower lobe of lung  Septic shock ([place])     MEDICATIONS GIVEN DURING THIS VISIT:  Medications  azithromycin (ZITHROMAX) 500 mg in sodium chloride 0.9 % 250 mL IVPB (has no administration in time range)  vancomycin (VANCOCIN) IVPB 1000 mg/200 mL premix (1,000 mg Intravenous New Bag/Given 02/26/21 2024)    Followed by  vancomycin (VANCOREADY) IVPB 750 mg/150 mL (has no administration in time range)  norepinephrine (LEVOPHED) 4mg  in 22mL premix infusion (2 mcg/min Intravenous New Bag/Given 02/26/21 2107)  ipratropium-albuterol (DUONEB) 0.5-2.5 (3) MG/3ML nebulizer solution 3 mL (3 mLs Nebulization Given 02/26/21 1913)  methylPREDNISolone sodium succinate (SOLU-MEDROL) 125 mg/2 mL injection 125 mg (125 mg Intravenous  Given 02/26/21 1916)  ceFEPIme (MAXIPIME) 2 g in sodium chloride 0.9 % 100 mL IVPB (0 g Intravenous Stopped 02/26/21 2024)  sodium chloride 0.9 % bolus 1,000 mL (0 mLs Intravenous Stopped 02/26/21 2147)  lactated ringers bolus 1,000 mL (0 mLs Intravenous Stopped 02/26/21 2147)  lactated ringers bolus 500 mL (500 mLs Intravenous New Bag/Given 02/26/21 2023)     ED Discharge Orders    None       Note:  This document was prepared using Dragon voice recognition software and may include unintentional dictation errors.   Vanessa [place], MD 02/26/21 2153

## 2021-02-26 NOTE — Progress Notes (Signed)
Elink following for Sepsis Protocol 

## 2021-02-26 NOTE — Consult Note (Signed)
PHARMACY -  BRIEF ANTIBIOTIC NOTE   Pharmacy has received consult(s) for cefepime and vancomycin from an ED provider.  The patient's profile has been reviewed for ht/wt/allergies/indication/available labs.    One time order(s) placed for   Cefepime 2 g  Vancomycin 1750 mg   Further antibiotics/pharmacy consults should be ordered by admitting physician if indicated.                       Thank you, Dorothe Pea, PharmD, BCPS Clinical Pharmacist  02/26/2021  7:53 PM

## 2021-02-26 NOTE — H&P (Incomplete)
NAME:  Christian Fields, MRN:  [id number], DOB:  1956/08/27, LOS: 0 ADMISSION DATE:  02/26/2021, CONSULTATION DATE:  02/26/2021 REFERRING MD:  Dr. Jari Pigg, CHIEF COMPLAINT: Shortness of breath  History of Present Illness:  65 year old male with known COPD on chronic 2 L nasal cannula at home and prior left upper lobe lung cancer status post radiation presents to the ED from home via EMS in acute hypoxic respiratory failure ultimately requiring BiPAP and vasopressors for septic shock.  ED course:   Pertinent  Medical History  COPD on chronic 2 L nasal cannula Type 2 diabetes mellitus Chronic back pain on Suboxone Hypertension Hyperlipidemia  Significant Hospital Events: Including procedures, antibiotic start and stop dates in addition to other pertinent events   .   Interim History / Subjective:  ***  Objective   Blood pressure (!) 79/51, pulse (!) 125, temperature 99.1 F (37.3 C), temperature source Axillary, resp. rate (!) 25, height 5\' 9"  (1.753 m), weight 74.4 kg, SpO2 99 %.        Intake/Output Summary (Last 24 hours) at 02/26/2021 2200 Last data filed at 02/26/2021 2147 Gross per 24 hour  Intake 365.84 ml  Output -  Net 365.84 ml   Filed Weights   02/26/21 1903  Weight: 74.4 kg    Examination: General: Adult ***, critically***chronically ill, lying in bed intubated & sedated requiring mechanical ventilation *** NAD HEENT: MM pink/moist, anicteric***, atraumatic, neck supple Neuro: A&O x *** commands, PERRL *** , MAE CV: s1s2 ***RRR, *** on monitor, no r/m/g Pulm: Regular, non labored on *** , breath sounds ***-BUL & ***-BLL GI: soft, ***, non***tender, bs x 4 GU: foley in place *** with clear yellow urine Skin: *** no rashes/lesions noted Extremities: warm/dry, pulses + 2 R/P, *** edema noted  Labs/imaging that I have personally reviewed   ***  Resolved Hospital Problem list     Assessment & Plan:  Sepsis with/***without septic shock due to suspected  *** (Criteria:  Temperature less than 96.8 or greater than 101, respiratory rate greater than 20/min, heart rate greater than 90 bpm, acutely altered mental status, WBC greater than 12 or less than 4 + suspected infection = sepsis) Lactic: ***, Baseline PCT: ***, UA: ***, CXR: ***, CT: ***  Initial interventions/workup included: *** L of NS/LR*** & Cefepime/ Vancomycin/ Metronidazole*** - Supplemental oxygen as needed, to maintain SpO2 > 90% - f/u cultures, trend lactic/ PCT - Daily CBC - monitor WBC/ fever curve - IV antibiotics: cefepime (HCAP/UTI)*** & vancomycin *** ceftriaxone (CAP) *** zosyn (intra-abdominal) *** unasyn (aspiration PNA) - IVF hydration as needed: *** - Consider/*** vasopressors to maintain MAP< 65, norepinephrine***vasopressin - Strict I/O's: alert provider if UOP < 0.5 mL/kg/hr - Persistent hypotension consider stress dose steroids (hydrocortisone 50q6 or 100q8)   Acute Hypoxic/ *** Hypercapnic Respiratory Failure secondary to *** PMHx: *** - Continue BIPAP/***HHFNC overnight, wean FiO2 as tolerated - Supplemental O2 to maintain SpO2 > 90%***88% - Intermittent chest x-ray & ABG PRN*** - Daily WUA with SBT as tolerated  - Ensure adequate pulmonary hygiene  - F/u cultures, trend PCT - Continue CAP/***Aspiration Pna coverage: cefepime/***unasyn - budesonide inhaler/***nebs BID, bronchodilators PRN  Acute Exacerbation of Chronic COPD  PMHx: Asthma, COPD, tobacco use DDX include Asthma, AECHF, Bronchiectasis, TB, or Bronchiolitis  PFT's??*** - Supplemental oxygen as needed, maintain SpO2 > 88% - Continue Bronchodilators as needed - ICS >> Pulmicort BID - LABA >> Brovana BID *** - LAMA >> Duoneb TID - Methylprednisolone IV 40***125 mg Q  6 - ***Consider IV Azithromycin 500mg  daily x 3 days - Follow cultures  - Trend PCT, monitor WBC/ fever curve - consider NPPV BIPAP PRN - ***Smoking cessation education once stabilized - ***Appropriate vaccinations prior to  discharge    Best practice (evaluated daily)  Diet:  {ZYSA:63016} Pain/Anxiety/Delirium protocol (if indicated): {Pain/Anxiety/Delirium:26941} VAP protocol (if indicated): {VAP:29640} DVT prophylaxis: {DVT Prophylaxis:26933} GI prophylaxis: {GI:26934} Glucose control:  {Glucose Control:26935} Central venous access:  {Central Venous Access:26936} Arterial line:  {Central Venous Access:26936} Foley:  {Central Venous Access:26936} Mobility:  {Mobility:26937}  PT consulted: {PT Consult:26938} Last date of multidisciplinary goals of care discussion [***] Code Status:  {Code Status:26939} Disposition: ***  Labs   CBC: Recent Labs  Lab 02/26/21 1915  WBC 19.1*  NEUTROABS 17.8*  HGB 13.1  HCT 42.7  MCV 97.0  PLT 010    Basic Metabolic Panel: Recent Labs  Lab 02/26/21 1915  NA 140  K 2.9*  CL 104  CO2 21*  GLUCOSE 115*  BUN 35*  CREATININE 3.05*  CALCIUM 8.8*   GFR: Estimated Creatinine Clearance: 23.8 mL/min (A) (by C-G formula based on SCr of 3.05 mg/dL (H)). Recent Labs  Lab 02/26/21 1911 02/26/21 1915 02/26/21 1947  PROCALCITON  --   --  99.28  WBC  --  19.1*  --   LATICACIDVEN 7.4*  --   --     Liver Function Tests: Recent Labs  Lab 02/26/21 1915  AST 27  ALT 11  ALKPHOS 59  BILITOT 0.5  PROT 6.8  ALBUMIN 3.5   No results for input(s): LIPASE, AMYLASE in the last 168 hours. No results for input(s): AMMONIA in the last 168 hours.  ABG    Component Value Date/Time   PHART 7.36 01/19/2016 2200   PCO2ART 51 (H) 01/19/2016 2200   PO2ART 84 01/19/2016 2200   HCO3 21.6 02/26/2021 1911   ACIDBASEDEF 8.0 (H) 02/26/2021 1911   O2SAT 29.6 02/26/2021 1911     Coagulation Profile: No results for input(s): INR, PROTIME in the last 168 hours.  Cardiac Enzymes: No results for input(s): CKTOTAL, CKMB, CKMBINDEX, TROPONINI in the last 168 hours.  HbA1C: Hemoglobin A1C  Date/Time Value Ref Range Status  09/25/2013 03:56 AM 6.3 4.2 - 6.3 % Final     Comment:    The American Diabetes Association recommends that a primary goal of therapy should be <7% and that physicians should reevaluate the treatment regimen in patients with HbA1c values consistently >8%.    Hgb A1c MFr Bld  Date/Time Value Ref Range Status  01/30/2021 06:25 AM 5.5 4.8 - 5.6 % Final    Comment:    (NOTE) Pre diabetes:          5.7%-6.4%  Diabetes:              >6.4%  Glycemic control for   <7.0% adults with diabetes   11/12/2020 11:14 PM 6.0 (H) 4.8 - 5.6 % Final    Comment:    (NOTE) Pre diabetes:          5.7%-6.4%  Diabetes:              >6.4%  Glycemic control for   <7.0% adults with diabetes     CBG: No results for input(s): GLUCAP in the last 168 hours.  Review of Systems: Positives in BOLD***  Gen: Denies fever, chills, weight change, fatigue, night sweats HEENT: Denies blurred vision, double vision, hearing loss, tinnitus, sinus congestion, rhinorrhea, sore throat, neck stiffness, dysphagia  PULM: Denies shortness of breath, cough, sputum production, hemoptysis, wheezing CV: Denies chest pain, edema, orthopnea, paroxysmal nocturnal dyspnea, palpitations GI: Denies abdominal pain, nausea, vomiting, diarrhea, hematochezia, melena, constipation, change in bowel habits GU: Denies dysuria, hematuria, polyuria, oliguria, urethral discharge Endocrine: Denies hot or cold intolerance, polyuria, polyphagia or appetite change Derm: Denies rash, dry skin, scaling or peeling skin change Heme: Denies easy bruising, bleeding, bleeding gums Neuro: Denies headache, numbness, weakness, slurred speech, loss of memory or consciousness  Past Medical History:  He,  has a past medical history of Anemia, Anxiety, Arthritis, Asthma, Cancer ([place]), Chronic back pain, COPD (chronic obstructive pulmonary disease) ([place]), Depression, Diabetes mellitus ([place]), Dyspnea, GERD (gastroesophageal reflux disease), Gout, Gout, Headache, History of blood clots, History of kidney  stones, Hyperlipidemia, Hyperlipidemia, Hypertension, Kidney stones, Neuropathy, On home oxygen therapy, Pneumonia (06/2017), Sleep apnea, and Ulcer of foot ([place]).   Surgical History:   Past Surgical History:  Procedure Laterality Date  . APPENDECTOMY    . DG FEET 2 VIEWS BILAT    . LIPOMA EXCISION Right 08/15/2017   Procedure: EXCISION TUMOR(CYST) FOOT;  Surgeon: Albertine Patricia, DPM;  Location: [facility] ORS;  Service: Podiatry;  Laterality: Right;  . OTHER SURGICAL HISTORY Bilateral Foot surgery     Social History:   reports that he has quit smoking. His smoking use included cigarettes. He started smoking about 4 years ago. He smoked 0.50 packs per day. He has never used smokeless tobacco. He reports current alcohol use of about 1.0 standard drink of alcohol per week. He reports that he does not use drugs.   Family History:  His family history includes Hypertension in his mother.   Allergies Allergies  Allergen Reactions  . Bee Venom Anaphylaxis  . Hydrocodone-Acetaminophen Itching     Home Medications  Prior to Admission medications   Medication Sig Start Date End Date Taking? Authorizing Provider  albuterol (VENTOLIN HFA) 108 (90 Base) MCG/ACT inhaler Inhale 2 puffs into the lungs every 4 (four) hours as needed for wheezing or shortness of breath.    [provider]  alendronate (FOSAMAX) 70 MG tablet Take 70 mg by mouth once a week.  06/23/17   [provider]  allopurinol (ZYLOPRIM) 300 MG tablet Take 600 mg by mouth 2 (two) times daily. 02/24/20   [provider]  aspirin EC 81 MG tablet Take 1 tablet by mouth daily.    [provider]  buprenorphine-naloxone (SUBOXONE) 8-2 mg SUBL SL tablet Place 1 tablet under the tongue 3 (three) times daily.    [provider]  cholecalciferol (VITAMIN D) 1000 units tablet Take 1,000 Units by mouth daily.    [provider]  clopidogrel (PLAVIX) 75 MG tablet Take 1 tablet (75 mg total) by  mouth daily. 02/02/21   Shawna Clamp, MD  DULoxetine (CYMBALTA) 30 MG capsule Take 1 capsule by mouth daily. 01/17/16   [provider]  ezetimibe (ZETIA) 10 MG tablet Take 10 mg by mouth daily. 02/24/20   [provider]  FARXIGA 10 MG TABS tablet Take 10 mg by mouth daily. 09/14/20   [provider]  ferrous sulfate 325 (65 FE) MG tablet Take 325 mg by mouth daily with breakfast.    [provider]  fluticasone (FLONASE) 50 MCG/ACT nasal spray Place 2 sprays into both nostrils daily. 12/18/15   [provider]  gabapentin (NEURONTIN) 800 MG tablet Take 800 mg by mouth 4 (four) times daily. 12/18/15   [provider]  hydrochlorothiazide (  HYDRODIURIL) 12.5 MG tablet Take 1 tablet (12.5 mg total) by mouth daily. Patient taking differently: Take 25 mg by mouth daily. 11/18/20   Nolberto Hanlon, MD  ipratropium (ATROVENT) 0.02 % nebulizer solution Inhale 2.5 mL (500 mcg total) by nebulization every 6 (six) hours. 12/16/16 01/15/19  [provider]  KOMBIGLYZE XR 2.04-999 MG TB24 Take 2 tablets by mouth daily. 01/17/16   [provider]  metoprolol succinate (TOPROL-XL) 50 MG 24 hr tablet Take 50 mg by mouth daily. 02/24/20   [provider]  ondansetron (ZOFRAN) 4 MG tablet Take 4 mg by mouth every 4 (four) hours as needed for nausea or vomiting. 10/25/20   [provider]  OXYGEN Inhale 2 L into the lungs.    [provider]  pantoprazole (PROTONIX) 40 MG tablet Take 40 mg by mouth daily. 12/18/15   [provider]  simvastatin (ZOCOR) 40 MG tablet Take 1 tablet by mouth daily. 01/17/16   [provider]  TRELEGY ELLIPTA 100-62.5-25 MCG/INH AEPB Inhale 1 puff into the lungs daily. 12/31/19   [provider]     Critical care time: 45 minutes       Venetia Night, AGACNP-BC Acute Care Nurse Practitioner [facility]   [phone] / [phone] Please see  Amion for pager details.

## 2021-02-26 NOTE — H&P (Signed)
NAME:  Christian Fields, MRN:  [id number], DOB:  [birth year], LOS: 0 ADMISSION DATE:  02/26/2021, CONSULTATION DATE:  02/26/2021 REFERRING MD:  Dr. Jari Pigg, CHIEF COMPLAINT: Shortness of breath  History of Present Illness:  65 year old male with known COPD on chronic 2 L nasal cannula at home and prior left upper lobe lung cancer status post radiation presents to the ED from home via EMS in acute hypoxic respiratory failure ultimately requiring BiPAP and vasopressors for septic shock. Per ED documentation, upon EMS arrival they were unable to obtain SPO2 readings.  Patient was placed on a nonrebreather for oxygen support with SPO2 readings in the high 80's.  He also received 2 duo nebs in route with EMS. ED course: Initial vitals-99.1, tachypneic at 25, tachycardic at 138, BP 130/116, SPO2 80% on nonrebreather at 15 L.  Patient was worked up for sepsis on arrival, receiving 2.5 liters with azithromycin and cefepime for CAP coverage.  Patient ultimately requiring BiPAP & vasopressor support. Significant labs: Procalcitonin elevated at 99.28, BNP elevated at 1149.5, lactic acidosis 7.4, hypokalemia at 2.9 & AKI: BUN 35/creatinine 3.05 elevated from 1.05 in February 2022. PCCM consulted for admission due to vasopressor administration & BiPAP support.  Pertinent  Medical History  COPD on chronic 2 L nasal cannula Type 2 diabetes mellitus Chronic back pain on Suboxone Hypertension Hyperlipidemia Left upper lobe cancer status post radiation Polysubstance abuse AAA Significant Hospital Events: Including procedures, antibiotic start and stop dates in addition to other pertinent events   . 02/26/2021 -admit to ICU due to septic shock requiring vasopressor administration and BiPAP support.  Interim History / Subjective:  Patient lying on ED stretcher with daughter bedside, appearing comfortable on BiPAP.  Patient was able to be weaned from 100% FiO2 on BiPAP to 60% with SPO2 is remaining greater than  92%. No current complaints of pain, patient reports developing a productive cough with brown sputum on Sunday, 02/25/2021 as well as progressive shortness of breath.  Patient denied chest pain, fever, nausea/vomiting/diarrhea, he did admit to some chills but states this is his baseline.  He denied using any " street drugs" and states that he is still not smoking.  He also denies regular alcohol use.  Patient admits he has not been taking his nebulizer treatments as prescribed.  Objective   Blood pressure (!) 79/51, pulse (!) 125, temperature 99.1 F (37.3 C), temperature source Axillary, resp. rate (!) 25, height 5\' 9"  (1.753 m), weight 74.4 kg, SpO2 99 %.        Intake/Output Summary (Last 24 hours) at 02/26/2021 2200 Last data filed at 02/26/2021 2147 Gross per 24 hour  Intake 365.84 ml  Output --  Net 365.84 ml   Filed Weights   02/26/21 1903  Weight: 74.4 kg    Examination: General: Adult male, critically ill, lying in bed, on BiPAP support, NAD HEENT: MM pink/moist, anicteric, atraumatic, neck supple Neuro: A&O x 4, able to follow commands, PERRL +3, MAE CV: s1s2 RRR, ST on monitor, no r/m/g Pulm: Regular, non labored on BIPAP @ 60%, breath sounds diminished throughout GI: soft, rounded, non tender, bs x 4 Skin: Scattered scabbed abrasions and ecchymosis Extremities: warm/dry, pulses + 2 R/P, trace edema noted BLE  Labs/imaging that I have personally reviewed   EKG Interpretation Date: 02/26/21 EKG Time: 1903 Rate: 139 Rhythm: Sinus tachycardia QRS Axis:  LAD Intervals: Due to artifact and wandering leads challenging to assess PR interval, QT prolongation ST/T Wave abnormalities: None noted Narrative Interpretation: Sinus  tachycardia with left axis deviation and prolonged QT interval  Na+/ K+: 140/2.9-replacement ordered BUN/Cr.:  35/3.05 Serum CO2/ AG: 21/15  Hgb: 13.1 Troponin: 50 BNP: 1149.5  WBC/ TMAX: 19.1/99 Lactic/ PCT: 7.4/99.28  VBG:  7.15/62/<31/21.6 CXR 02/26/2021: Right mid to lower lung field pneumonia, chronic emphysema, 14 mm nodular focus in the left midlung Resolved Hospital Problem list     Assessment & Plan:  Sepsis with septic shock due to community-acquired pneumonia Lactic: 7.4, Baseline PCT: 99 Initial interventions/workup included: 2.5 L of LR & Cefepime/azithromycin - Supplemental oxygen as needed, to maintain SpO2 > 90% - f/u cultures, trend lactic/ PCT/VBG - Daily CBC - monitor WBC/ fever curve - IV antibiotics: cefepime & azithromycin - IVF hydration as needed: 500 mL LR bolus, consider gentle IV fluid hydration post - Continue vasopressors to maintain MAP< 65, norepinephrine - Strict I/O's: alert provider if UOP < 0.5 mL/kg/hr - Persistent hypotension consider stress dose steroids  - f/u UA, echocardiogram ordered  Acute Hypoxic Respiratory Failure secondary to CAP PMHx: COPD, LUL lung cancer s/p radiation, OSA-no home CPAP - Continue BIPAP overnight, wean FiO2 as tolerated - Supplemental O2 to maintain SpO2 > 90% - Intermittent chest x-ray & ABG PRN - Ensure adequate pulmonary hygiene  - F/u cultures, trend PCT - Continue CAP coverage: cefepime/azithromycin - budesonide nebs BID, scheduled duo-nebs  Acute Kidney Injury in the setting of septic shock Baseline Cr: 1.05, Cr on admission: 3.05 - Strict I/O's: alert provider if UOP < 0.5 mL/kg/hr - gentle IVF hydration  - Daily BMP, replace electrolytes PRN - Avoid nephrotoxic agents as able, ensure adequate renal perfusion - Consult nephrology if iHD or CRRT indicated  - consider renal US  Chronic back pain PMHx: polysubstance abuse Patient normally on Suboxone at home as well as gabapentin -Patient is requesting gabapentin, will restart -Suboxone on hold for the time being overnight while on BiPAP - f/u UDS  HTN -Due to current hypotension, home regimen on hold, restart as patient stabilizes  HLD -Patient currently n.p.o. on  BiPAP, home regimen on hold, restart as patient stabilizes  Best practice (evaluated daily)  Diet:  NPO Pain/Anxiety/Delirium protocol (if indicated): No VAP protocol (if indicated): Not indicated DVT prophylaxis: Subcutaneous Heparin GI prophylaxis: PPI Glucose control:  SSI No Central venous access:  N/A, will consider placement if needed d/t vasopressor use Arterial line:  N/A Foley:  N/A Mobility:  bed rest  PT consulted: N/A Last date of multidisciplinary goals of care discussion 02/26/21 Code Status:  full code Disposition: ICU  Labs   CBC: Recent Labs  Lab 02/26/21 1915  WBC 19.1*  NEUTROABS 17.8*  HGB 13.1  HCT 42.7  MCV 97.0  PLT 263    Basic Metabolic Panel: Recent Labs  Lab 02/26/21 1915  NA 140  K 2.9*  CL 104  CO2 21*  GLUCOSE 115*  BUN 35*  CREATININE 3.05*  CALCIUM 8.8*   GFR: Estimated Creatinine Clearance: 23.8 mL/min (A) (by C-G formula based on SCr of 3.05 mg/dL (H)). Recent Labs  Lab 02/26/21 1911 02/26/21 1915 02/26/21 1947  PROCALCITON  --   --  99.28  WBC  --  19.1*  --   LATICACIDVEN 7.4*  --   --     Liver Function Tests: Recent Labs  Lab 02/26/21 1915  AST 27  ALT 11  ALKPHOS 59  BILITOT 0.5  PROT 6.8  ALBUMIN 3.5   No results for input(s): LIPASE, AMYLASE in the last 168 hours. No  results for input(s): AMMONIA in the last 168 hours.  ABG    Component Value Date/Time   PHART 7.36 01/19/2016 2200   PCO2ART 51 (H) 01/19/2016 2200   PO2ART 84 01/19/2016 2200   HCO3 21.6 02/26/2021 1911   ACIDBASEDEF 8.0 (H) 02/26/2021 1911   O2SAT 29.6 02/26/2021 1911     Coagulation Profile: No results for input(s): INR, PROTIME in the last 168 hours.  Cardiac Enzymes: No results for input(s): CKTOTAL, CKMB, CKMBINDEX, TROPONINI in the last 168 hours.  HbA1C: Hemoglobin A1C  Date/Time Value Ref Range Status  09/25/2013 03:56 AM 6.3 4.2 - 6.3 % Final    Comment:    The American Diabetes Association recommends that a  primary goal of therapy should be <7% and that physicians should reevaluate the treatment regimen in patients with HbA1c values consistently >8%.    Hgb A1c MFr Bld  Date/Time Value Ref Range Status  01/30/2021 06:25 AM 5.5 4.8 - 5.6 % Final    Comment:    (NOTE) Pre diabetes:          5.7%-6.4%  Diabetes:              >6.4%  Glycemic control for   <7.0% adults with diabetes   11/12/2020 11:14 PM 6.0 (H) 4.8 - 5.6 % Final    Comment:    (NOTE) Pre diabetes:          5.7%-6.4%  Diabetes:              >6.4%  Glycemic control for   <7.0% adults with diabetes     CBG: No results for input(s): GLUCAP in the last 168 hours.  Review of Systems: Positives in BOLD  Gen: Denies fever, chills, weight change, fatigue, night sweats HEENT: Denies blurred vision, double vision, hearing loss, tinnitus, sinus congestion, rhinorrhea, sore throat, neck stiffness, dysphagia PULM: Denies shortness of breath, cough, sputum production, hemoptysis, wheezing CV: Denies chest pain, edema, orthopnea, paroxysmal nocturnal dyspnea, palpitations GI: Denies abdominal pain, nausea, vomiting, diarrhea, hematochezia, melena, constipation, change in bowel habits GU: Denies dysuria, hematuria, polyuria, oliguria, urethral discharge Endocrine: Denies hot or cold intolerance, polyuria, polyphagia or appetite change Derm: Denies rash, dry skin, scaling or peeling skin change Heme: Denies easy bruising, bleeding, bleeding gums Neuro: Denies headache, numbness, weakness, slurred speech, loss of memory or consciousness  Past Medical History:  He,  has a past medical history of Anemia, Anxiety, Arthritis, Asthma, Cancer ([place]), Chronic back pain, COPD (chronic obstructive pulmonary disease) ([place]), Depression, Diabetes mellitus ([place]), Dyspnea, GERD (gastroesophageal reflux disease), Gout, Gout, Headache, History of blood clots, History of kidney stones, Hyperlipidemia, Hyperlipidemia, Hypertension, Kidney stones,  Neuropathy, On home oxygen therapy, Pneumonia (06/2017), Sleep apnea, and Ulcer of foot ([place]).   Surgical History:   Past Surgical History:  Procedure Laterality Date  . APPENDECTOMY    . DG FEET 2 VIEWS BILAT    . LIPOMA EXCISION Right 08/15/2017   Procedure: EXCISION TUMOR(CYST) FOOT;  Surgeon: Albertine Patricia, DPM;  Location: [facility] ORS;  Service: Podiatry;  Laterality: Right;  . OTHER SURGICAL HISTORY Bilateral Foot surgery     Social History:   reports that he has quit smoking. His smoking use included cigarettes. He started smoking about 4 years ago. He smoked 0.50 packs per day. He has never used smokeless tobacco. He reports current alcohol use of about 1.0 standard drink of alcohol per week. He reports that he does not use drugs.   Family History:  His family  history includes Hypertension in his mother.   Allergies Allergies  Allergen Reactions  . Bee Venom Anaphylaxis  . Hydrocodone-Acetaminophen Itching     Home Medications  Prior to Admission medications   Medication Sig Start Date End Date Taking? Authorizing Provider  albuterol (VENTOLIN HFA) 108 (90 Base) MCG/ACT inhaler Inhale 2 puffs into the lungs every 4 (four) hours as needed for wheezing or shortness of breath.    [provider]  alendronate (FOSAMAX) 70 MG tablet Take 70 mg by mouth once a week.  06/23/17   [provider]  allopurinol (ZYLOPRIM) 300 MG tablet Take 600 mg by mouth 2 (two) times daily. 02/24/20   [provider]  aspirin EC 81 MG tablet Take 1 tablet by mouth daily.    [provider]  buprenorphine-naloxone (SUBOXONE) 8-2 mg SUBL SL tablet Place 1 tablet under the tongue 3 (three) times daily.    [provider]  cholecalciferol (VITAMIN D) 1000 units tablet Take 1,000 Units by mouth daily.    [provider]  clopidogrel (PLAVIX) 75 MG tablet Take 1 tablet (75 mg total) by mouth daily. 02/02/21   Shawna Clamp, MD  DULoxetine (CYMBALTA) 30 MG  capsule Take 1 capsule by mouth daily. 01/17/16   [provider]  ezetimibe (ZETIA) 10 MG tablet Take 10 mg by mouth daily. 02/24/20   [provider]  FARXIGA 10 MG TABS tablet Take 10 mg by mouth daily. 09/14/20   [provider]  ferrous sulfate 325 (65 FE) MG tablet Take 325 mg by mouth daily with breakfast.    [provider]  fluticasone (FLONASE) 50 MCG/ACT nasal spray Place 2 sprays into both nostrils daily. 12/18/15   [provider]  gabapentin (NEURONTIN) 800 MG tablet Take 800 mg by mouth 4 (four) times daily. 12/18/15   [provider]  hydrochlorothiazide (HYDRODIURIL) 12.5 MG tablet Take 1 tablet (12.5 mg total) by mouth daily. Patient taking differently: Take 25 mg by mouth daily. 11/18/20   Nolberto Hanlon, MD  ipratropium (ATROVENT) 0.02 % nebulizer solution Inhale 2.5 mL (500 mcg total) by nebulization every 6 (six) hours. 12/16/16 01/15/19  [provider]  KOMBIGLYZE XR 2.04-999 MG TB24 Take 2 tablets by mouth daily. 01/17/16   [provider]  metoprolol succinate (TOPROL-XL) 50 MG 24 hr tablet Take 50 mg by mouth daily. 02/24/20   [provider]  ondansetron (ZOFRAN) 4 MG tablet Take 4 mg by mouth every 4 (four) hours as needed for nausea or vomiting. 10/25/20   [provider]  OXYGEN Inhale 2 L into the lungs.    [provider]  pantoprazole (PROTONIX) 40 MG tablet Take 40 mg by mouth daily. 12/18/15   [provider]  simvastatin (ZOCOR) 40 MG tablet Take 1 tablet by mouth daily. 01/17/16   [provider]  TRELEGY ELLIPTA 100-62.5-25 MCG/INH AEPB Inhale 1 puff into the lungs daily. 12/31/19   [provider]     Critical care time: 45 minutes       Venetia Night, AGACNP-BC Acute Care Nurse Practitioner [facility]   [phone] / [phone] Please see Amion for pager details.

## 2021-02-26 NOTE — ED Triage Notes (Signed)
Pt from home, SOB x 2 d and has not taken meds for 2 days. Pt with hx of COPD, DM. Pt with sats of hi 80s on non rebreather. Pt given 2 duonebs in route. Pt SOB on arrival

## 2021-02-26 NOTE — Consult Note (Signed)
CODE SEPSIS - PHARMACY COMMUNICATION  **Broad Spectrum Antibiotics should be administered within 1 hour of Sepsis diagnosis**  Time Code Sepsis Called/Page Received: 1945  Antibiotics Ordered: vancomycin, cefepime, azithromycin  Time of 1st antibiotic administration: 2008  Additional action taken by pharmacy: n/a  If necessary, Name of Provider/Nurse Contacted: n/a    Dorothe Pea ,PharmD Clinical Pharmacist  02/26/2021  7:52 PM

## 2021-02-27 ENCOUNTER — Inpatient Hospital Stay: Payer: Medicare HMO

## 2021-02-27 ENCOUNTER — Ambulatory Visit: Payer: Medicare HMO | Admitting: Oncology

## 2021-02-27 ENCOUNTER — Other Ambulatory Visit: Payer: Medicare HMO

## 2021-02-27 LAB — BLOOD GAS, VENOUS
Acid-base deficit: 10.7 mmol/L — ABNORMAL HIGH (ref 0.0–2.0)
Acid-base deficit: 5 mmol/L — ABNORMAL HIGH (ref 0.0–2.0)
Bicarbonate: 19.1 mmol/L — ABNORMAL LOW (ref 20.0–28.0)
Bicarbonate: 22 mmol/L (ref 20.0–28.0)
Delivery systems: POSITIVE
FIO2: 50
FIO2: 0.5
Mode: POSITIVE
O2 Saturation: 42.9 %
O2 Saturation: 86 %
Patient temperature: 37
Patient temperature: 37
pCO2, Ven: 60 mmHg (ref 44.0–60.0)
pCO2, Ven: 48 mmHg (ref 44.0–60.0)
pH, Ven: 7.11 — CL (ref 7.250–7.430)
pH, Ven: 7.27 (ref 7.250–7.430)
pO2, Ven: 34 mmHg (ref 32.0–45.0)
pO2, Ven: 59 mmHg — ABNORMAL HIGH (ref 32.0–45.0)

## 2021-02-27 LAB — CBC
HCT: 36.1 % — ABNORMAL LOW (ref 39.0–52.0)
Hemoglobin: 11.3 g/dL — ABNORMAL LOW (ref 13.0–17.0)
MCH: 30.4 pg (ref 26.0–34.0)
MCHC: 31.3 g/dL (ref 30.0–36.0)
MCV: 97 fL (ref 80.0–100.0)
Platelets: 210 10*3/uL (ref 150–400)
RBC: 3.72 MIL/uL — ABNORMAL LOW (ref 4.22–5.81)
RDW: 15 % (ref 11.5–15.5)
WBC: 24.8 10*3/uL — ABNORMAL HIGH (ref 4.0–10.5)
nRBC: 0.2 % (ref 0.0–0.2)

## 2021-02-27 LAB — BASIC METABOLIC PANEL
Anion gap: 12 (ref 5–15)
BUN: 40 mg/dL — ABNORMAL HIGH (ref 8–23)
CO2: 21 mmol/L — ABNORMAL LOW (ref 22–32)
Calcium: 7.8 mg/dL — ABNORMAL LOW (ref 8.9–10.3)
Chloride: 106 mmol/L (ref 98–111)
Creatinine, Ser: 2.78 mg/dL — ABNORMAL HIGH (ref 0.61–1.24)
GFR, Estimated: 24 mL/min — ABNORMAL LOW (ref >60)
Glucose, Bld: 142 mg/dL — ABNORMAL HIGH (ref 70–99)
Potassium: 3.2 mmol/L — ABNORMAL LOW (ref 3.5–5.1)
Sodium: 139 mmol/L (ref 135–145)

## 2021-02-27 LAB — MAGNESIUM
Magnesium: 0.5 mg/dL — CL (ref 1.7–2.4)
Magnesium: 2.3 mg/dL (ref 1.7–2.4)
Magnesium: 1.7 mg/dL (ref 1.7–2.4)

## 2021-02-27 LAB — URINALYSIS, COMPLETE (UACMP) WITH MICROSCOPIC
Bacteria, UA: NONE SEEN
Bilirubin Urine: NEGATIVE
Glucose, UA: 150 mg/dL — AB
Ketones, ur: NEGATIVE mg/dL
Leukocytes,Ua: NEGATIVE
Nitrite: NEGATIVE
Protein, ur: 30 mg/dL — AB
Specific Gravity, Urine: 1.017 (ref 1.005–1.030)
Squamous Epithelial / HPF: NONE SEEN (ref 0–5)
pH: 5 (ref 5.0–8.0)

## 2021-02-27 LAB — URINE DRUG SCREEN, QUALITATIVE (ARMC ONLY)
Amphetamines, Ur Screen: NOT DETECTED
Barbiturates, Ur Screen: NOT DETECTED
Benzodiazepine, Ur Scrn: POSITIVE — AB
Cannabinoid 50 Ng, Ur ~~LOC~~: NOT DETECTED
Cocaine Metabolite,Ur ~~LOC~~: NOT DETECTED
MDMA (Ecstasy)Ur Screen: NOT DETECTED
Methadone Scn, Ur: NOT DETECTED
Opiate, Ur Screen: POSITIVE — AB
Phencyclidine (PCP) Ur S: NOT DETECTED
Tricyclic, Ur Screen: NOT DETECTED

## 2021-02-27 LAB — TROPONIN I (HIGH SENSITIVITY): Troponin I (High Sensitivity): 54 ng/L — ABNORMAL HIGH (ref <18)

## 2021-02-27 LAB — POTASSIUM
Potassium: 3.8 mmol/L (ref 3.5–5.1)
Potassium: 4 mmol/L (ref 3.5–5.1)

## 2021-02-27 LAB — CORTISOL: Cortisol, Plasma: 24.3 ug/dL

## 2021-02-27 LAB — LACTIC ACID, PLASMA
Lactic Acid, Venous: 5.4 mmol/L (ref 0.5–1.9)
Lactic Acid, Venous: 4.5 mmol/L (ref 0.5–1.9)
Lactic Acid, Venous: 6.1 mmol/L (ref 0.5–1.9)
Lactic Acid, Venous: 6.5 mmol/L (ref 0.5–1.9)
Lactic Acid, Venous: 4.4 mmol/L (ref 0.5–1.9)

## 2021-02-27 LAB — PHOSPHORUS: Phosphorus: 3.5 mg/dL (ref 2.5–4.6)

## 2021-02-27 LAB — PROCALCITONIN: Procalcitonin: 129.66 ng/mL

## 2021-02-27 LAB — GLUCOSE, CAPILLARY
Glucose-Capillary: 119 mg/dL — ABNORMAL HIGH (ref 70–99)
Glucose-Capillary: 191 mg/dL — ABNORMAL HIGH (ref 70–99)
Glucose-Capillary: 173 mg/dL — ABNORMAL HIGH (ref 70–99)

## 2021-02-27 LAB — STREP PNEUMONIAE URINARY ANTIGEN: Strep Pneumo Urinary Antigen: NEGATIVE

## 2021-02-27 MED ORDER — SODIUM CHLORIDE 0.9 % IV SOLN
2.0000 g | INTRAVENOUS | Status: DC
  Filled 2021-02-27: qty 2

## 2021-02-27 MED ORDER — SODIUM BICARBONATE 8.4 % IV SOLN
150.0000 meq | Freq: Once | INTRAVENOUS | Status: AC
  Administered 2021-02-27: 150 meq via INTRAVENOUS
  Filled 2021-02-27: qty 150

## 2021-02-27 MED ORDER — ALBUMIN HUMAN 5 % IV SOLN
25.0000 g | Freq: Once | INTRAVENOUS | Status: AC
  Administered 2021-02-27: 25 g via INTRAVENOUS
  Filled 2021-02-27: qty 500

## 2021-02-27 MED ORDER — ORAL CARE MOUTH RINSE
15.0000 mL | Freq: Two times a day (BID) | OROMUCOSAL | Status: DC
  Administered 2021-02-27 – 2021-03-08 (×14): 15 mL via OROMUCOSAL

## 2021-02-27 MED ORDER — VASOPRESSIN 20 UNITS/100 ML INFUSION FOR SHOCK
0.0000 [IU]/min | INTRAVENOUS | Status: DC
  Administered 2021-02-27 (×2): 0.03 [IU]/min via INTRAVENOUS
  Filled 2021-02-27 (×3): qty 100

## 2021-02-27 MED ORDER — GABAPENTIN 400 MG PO CAPS
800.0000 mg | ORAL_CAPSULE | Freq: Four times a day (QID) | ORAL | Status: DC
  Administered 2021-02-27 – 2021-03-02 (×14): 800 mg via ORAL
  Filled 2021-02-27 (×13): qty 2

## 2021-02-27 MED ORDER — MAGNESIUM SULFATE 2 GM/50ML IV SOLN
2.0000 g | Freq: Once | INTRAVENOUS | Status: AC
  Administered 2021-02-27: 2 g via INTRAVENOUS
  Filled 2021-02-27: qty 50

## 2021-02-27 MED ORDER — VANCOMYCIN VARIABLE DOSE PER UNSTABLE RENAL FUNCTION (PHARMACIST DOSING): Status: DC

## 2021-02-27 MED ORDER — SODIUM CHLORIDE 0.9 % IV SOLN: INTRAVENOUS | Status: DC

## 2021-02-27 MED ORDER — VANCOMYCIN HCL 1000 MG/200ML IV SOLN
1000.0000 mg | Freq: Once | INTRAVENOUS | Status: AC
  Administered 2021-02-27: 1000 mg via INTRAVENOUS
  Filled 2021-02-27: qty 200

## 2021-02-27 MED ORDER — INSULIN ASPART 100 UNIT/ML ~~LOC~~ SOLN
0.0000 [IU] | Freq: Three times a day (TID) | SUBCUTANEOUS | Status: DC
  Administered 2021-02-27: 3 [IU] via SUBCUTANEOUS
  Administered 2021-02-28 (×2): 2 [IU] via SUBCUTANEOUS
  Administered 2021-03-01: 3 [IU] via SUBCUTANEOUS
  Administered 2021-03-02: 2 [IU] via SUBCUTANEOUS
  Administered 2021-03-03: 5 [IU] via SUBCUTANEOUS
  Administered 2021-03-03 – 2021-03-04 (×2): 3 [IU] via SUBCUTANEOUS
  Administered 2021-03-04: 2 [IU] via SUBCUTANEOUS
  Administered 2021-03-05: 3 [IU] via SUBCUTANEOUS
  Administered 2021-03-05 – 2021-03-08 (×4): 2 [IU] via SUBCUTANEOUS
  Filled 2021-02-27 (×14): qty 1

## 2021-02-27 MED ORDER — ALBUMIN HUMAN 5 % IV SOLN: 12.5000 g | Freq: Once | INTRAVENOUS | Status: DC

## 2021-02-27 MED ORDER — INSULIN ASPART 100 UNIT/ML ~~LOC~~ SOLN
0.0000 [IU] | Freq: Every day | SUBCUTANEOUS | Status: DC
  Filled 2021-02-27: qty 1

## 2021-02-27 MED ORDER — GABAPENTIN 400 MG PO CAPS
800.0000 mg | ORAL_CAPSULE | Freq: Once | ORAL | Status: DC
  Filled 2021-02-27: qty 2

## 2021-02-27 MED ORDER — GABAPENTIN 800 MG PO TABS
800.0000 mg | ORAL_TABLET | Freq: Three times a day (TID) | ORAL | Status: DC
  Filled 2021-02-27 (×3): qty 1

## 2021-02-27 MED ORDER — HYDROMORPHONE HCL 1 MG/ML IJ SOLN
0.5000 mg | Freq: Four times a day (QID) | INTRAMUSCULAR | Status: DC
  Administered 2021-02-27 – 2021-03-01 (×7): 0.5 mg via INTRAVENOUS
  Filled 2021-02-27 (×7): qty 1

## 2021-02-27 MED ORDER — HYDROCORTISONE NA SUCCINATE PF 100 MG IJ SOLR
50.0000 mg | Freq: Four times a day (QID) | INTRAMUSCULAR | Status: DC
  Administered 2021-02-27 – 2021-03-01 (×8): 50 mg via INTRAVENOUS
  Filled 2021-02-27 (×8): qty 2

## 2021-02-27 MED ORDER — NOREPINEPHRINE 16 MG/250ML-% IV SOLN
0.0000 ug/min | INTRAVENOUS | Status: DC
  Administered 2021-02-27: 20 ug/min via INTRAVENOUS
  Filled 2021-02-27 (×2): qty 250

## 2021-02-27 MED ORDER — GABAPENTIN 800 MG PO TABS
800.0000 mg | ORAL_TABLET | Freq: Four times a day (QID) | ORAL | Status: DC
  Filled 2021-02-27: qty 1

## 2021-02-27 MED ORDER — ONDANSETRON HCL 4 MG/2ML IJ SOLN
4.0000 mg | Freq: Four times a day (QID) | INTRAMUSCULAR | Status: DC | PRN
  Administered 2021-02-27 – 2021-02-28 (×2): 4 mg via INTRAVENOUS
  Filled 2021-02-27 (×2): qty 2

## 2021-02-27 MED ORDER — PIPERACILLIN-TAZOBACTAM 3.375 G IVPB
3.3750 g | Freq: Three times a day (TID) | INTRAVENOUS | Status: DC
  Administered 2021-02-27 – 2021-03-06 (×20): 3.375 g via INTRAVENOUS
  Filled 2021-02-27 (×20): qty 50

## 2021-02-27 MED ORDER — LACTATED RINGERS IV SOLN: INTRAVENOUS | Status: DC

## 2021-02-27 MED ORDER — MAGNESIUM SULFATE 4 GM/100ML IV SOLN
4.0000 g | Freq: Once | INTRAVENOUS | Status: AC
  Administered 2021-02-27: 4 g via INTRAVENOUS
  Filled 2021-02-27: qty 100

## 2021-02-27 MED ORDER — CLOPIDOGREL BISULFATE 75 MG PO TABS
75.0000 mg | ORAL_TABLET | Freq: Every day | ORAL | Status: DC
  Administered 2021-02-27 – 2021-03-09 (×11): 75 mg via ORAL
  Filled 2021-02-27 (×11): qty 1

## 2021-02-27 MED ORDER — POTASSIUM CHLORIDE 10 MEQ/100ML IV SOLN
10.0000 meq | INTRAVENOUS | Status: AC
  Administered 2021-02-27 (×3): 10 meq via INTRAVENOUS
  Filled 2021-02-27 (×3): qty 100

## 2021-02-27 NOTE — ED Notes (Signed)
Report given Cee-cee RN

## 2021-02-27 NOTE — Progress Notes (Signed)
eLink Physician-Brief Progress Note Patient Name: Christian Fields DOB: 18-Jan-1956 MRN: [id number]   Date of Service  02/27/2021  HPI/Events of Note  32 M history of COPD on home O2, LUL lung CA s/p radtx brought in by EMS due to respiratory distress and hypoxia. Hypotensive and tachycardic given fluids and started on antibiotics for pneumonia sepsis with improvement in lactic acidosis from 7.4 to 5.4. Urinalysis pending.  eICU Interventions  Patient seen tolerating BiPap and on norepinephrine. To be started on maintenance fluids. On systemic steroids and bronchodilators for COPD exacerbation.     Intervention Category Major Interventions: Shock - evaluation and management;Respiratory failure - evaluation and management;Electrolyte abnormality - evaluation and management;Acute renal failure - evaluation and management Evaluation Type: New Patient Evaluation  Judd Lien 02/27/2021, 3:34 AM

## 2021-02-27 NOTE — Progress Notes (Signed)
Provider Domingo Pulse NP will be ordering F/U lactic in am as well as VBG, cautions and concerns with excessive fluids

## 2021-02-27 NOTE — Progress Notes (Signed)
PHARMACY CONSULT NOTE - FOLLOW UP  Pharmacy Consult for Electrolyte Monitoring and Replacement   Recent Labs: Potassium (mmol/L)  Date Value  02/27/2021 3.2 (L)  05/17/2014 3.5   Magnesium (mg/dL)  Date Value  02/27/2021 0.5 (LL)   Calcium (mg/dL)  Date Value  02/27/2021 7.8 (L)   Calcium, Total (mg/dL)  Date Value  03/31/2014 9.3   Albumin (g/dL)  Date Value  02/26/2021 3.5  09/24/2013 3.6   Phosphorus (mg/dL)  Date Value  02/27/2021 3.5   Sodium (mmol/L)  Date Value  02/27/2021 139  03/31/2014 136    Assessment: 65 yo m presented with shortness of breath concerning for CAP. PMH includes COPD, diabetes, L upper lobe lung cancer s/p radiation, chronic back pain. Pharmacy consulted to assist in electrolyte management.   MIVF: LR @ 5- mL/hr  Goal of Therapy:  Electrolytes WNL  Plan:  --Ca 7.8 - monitor --Mg 0.5 - NP ordered Mg 4g and 2g IV x 1 dose --K 3.2 - KCl 10 mEq IV x 4 doses and 40 mEq PO x 1 given --Recheck Mg and K level at 1800 --Monitor electrolytes with AM labs  Benn Moulder, PharmD Pharmacy Resident  02/27/2021 7:20 AM

## 2021-02-27 NOTE — Progress Notes (Signed)
NAME:  Christian Fields, MRN:  [id number], DOB:  09-28-1956, LOS: 1 ADMISSION DATE:  02/26/2021, CONSULTATION DATE:  02/26/2021 REFERRING MD:  Dr. Jari Pigg, CHIEF COMPLAINT: Shortness of breath  History of Present Illness:  65 year old male with known COPD on chronic 2 L nasal cannula at home and prior left upper lobe lung cancer status post radiation presents to the ED from home via EMS in acute hypoxic respiratory failure ultimately requiring BiPAP and vasopressors for septic shock. Per ED documentation, upon EMS arrival they were unable to obtain SPO2 readings.  Patient was placed on a nonrebreather for oxygen support with SPO2 readings in the high 80's.  He also received 2 duo nebs in route with EMS. ED course: Initial vitals-99.1, tachypneic at 25, tachycardic at 138, BP 130/116, SPO2 80% on nonrebreather at 15 L.  Patient was worked up for sepsis on arrival, receiving 2.5 liters with azithromycin and cefepime for CAP coverage.  Patient ultimately requiring BiPAP & vasopressor support. Significant labs: Procalcitonin elevated at 99.28, BNP elevated at 1149.5, lactic acidosis 7.4, hypokalemia at 2.9 & AKI: BUN 35/creatinine 3.05 elevated from 1.05 in February 2022. PCCM consulted for admission due to vasopressor administration & BiPAP support.  Pertinent  Medical History  COPD on chronic 2 L nasal cannula Type 2 diabetes mellitus Chronic back pain on Suboxone Hypertension Hyperlipidemia Left upper lobe cancer status post radiation Polysubstance abuse AAA Significant Hospital Events: Including procedures, antibiotic start and stop dates in addition to other pertinent events   . 02/26/2021 -admit to ICU due to septic shock requiring vasopressor administration and BiPAP support. . 02/27/2021- Left femoral CVC placed due to high dose pressors. Tolerating BiPAP, VBG improving  Cultures:  3/21: SARS-CoV-2 PCR>>negative 3/21: Influenza PCR>>negative 3/21: Blood culture x2>> 3/22: Strep pneumo  urinary antigen>> 3/22: Legionella urinary antigen>> 3/22: Mycoplasma Pneumoniae antibody>>  Antimicrobials:  Azithromycin 3/21>> Cefepime 3/21>> Vancomycin 3/21 x1 dose  Interim History / Subjective:  Pt on BiPAP, tolerating~ VBG improved Left Femoral CVC placed early this morning due to vasopressors Currently on 20 mcg Levophed and Vasopressin 0.03 units shock dose Reports his breathing is improved on BiPAP Denies chest pain, SOB, abdominal pain, N/V/D, fever, chills Requests something to drink, take his Gabapentin, and come off BiPAP   Objective   Blood pressure (!) 88/65, pulse (!) 119, temperature 99.1 F (37.3 C), temperature source Axillary, resp. rate 20, height 5\' 9"  (1.753 m), weight 81.3 kg, SpO2 94 %.        Intake/Output Summary (Last 24 hours) at 02/27/2021 0838 Last data filed at 02/27/2021 0501 Gross per 24 hour  Intake 1646.35 ml  Output 375 ml  Net 1271.35 ml   Filed Weights   02/26/21 1903 02/27/21 0500  Weight: 74.4 kg 81.3 kg    Examination: General: Acutely ill appearing male, laying in bed, on BiPAP, in NAD HEENT: Atraumatic,normocephalic, neck supple, no JVD Neuro:  Sleeping, arouses to voice, A&O x4, follows commands all extremities, no focal deficits, speech clear, pupils PERRL CV: Tachycardia, regular rhythm (ST on telemetry), s1s2, no M/R/G Pulm: Breath sounds diminished throughout, even, BiPAP assisted GI: Soft, nontender, nondistended, no guarding or rebound tenderness, BS+ x4 Skin: Warm and dry.  Scattered ecchymosis and abrasions throughout Extremities: Warm/dry.  2+ distal pulses, trace edema bilateral LE  Labs/imaging that I have personally reviewed   VBG: pH 7.27/pCO2 48/pO2 59/ Bicarb 22 K 3.2, BUN 40, Cr. 2.78, Mag 0.5, Lactic 5.4, PCT 129.66, WBC 24.8, Hgb 11.3 3/21: CXR>>Background of emphysema.  A 14 mm faintly visualized nodular focus in the left mid lung field may correspond to the nodule seen on the prior CT. There is diffuse  chronic interstitial coarsening and bronchitic changes. There is increased interstitial markings and nodularity involving the right mid to lower lung field most consistent with superimposed pneumonia. Clinical correlation and follow-up to resolution recommended. No pleural effusion pneumothorax. The cardiac silhouette is within limits. Atherosclerotic calcification of the aorta. No acute osseous pathology.  Resolved Hospital Problem list     Assessment & Plan:   Septic shock  Lactic: 7.4, Baseline PCT: 99 Initial interventions/workup included: 2.5 L of LR & Cefepime/azithromycin -Continuous cardiac monitoring -Maintain MAP >65 -Received IV fluid resuscitation in ED -IV fluids -Vasopressors as needed to maintain MAP goal (currently on Levophed & Vasopressin) -Add Stress Dose steroids -Will give albumin x1 dose -Trend lactic acid -Echocardiogram pending   Severe Sepsis in the setting of Community Acquired Pneumonia -Monitor fever curve -Trend WBC's & Procalcitonin -Follow cultures as above -Continue Azithromycin & Cefepime pending cultures & sensitivities -urinalysis negative for UTI   Acute Hypoxic Respiratory Failure secondary to CAP PMHx: COPD, LUL lung cancer s/p radiation, OSA-no home CPAP -Supplemental O2 as needed to maintain O2 sats 88 to 94% -BiPAP, wean as tolerated -Follow intermittent CXR & ABG as needed -Continue CAP coverage -Bronchodilators & Budesonide nebs   Acute Kidney Injury in the setting of septic shock Baseline Cr: 1.05, Cr on admission: 3.05 -Monitor I&O's / urinary output -Follow BMP -Ensure adequate renal perfusion -Avoid nephrotoxic agents as able -Replace electrolytes as indicated -IV fluids -Renal US pending -Consider Nephrology consult if renal function worsens   Chronic back pain PMHx: polysubstance abuse Patient normally on Suboxone at home as well as gabapentin -Patient is requesting gabapentin, will restart -Suboxone on hold  for the time being overnight while on BiPAP -UDS positive for Benzodiazepines & Opiates   HTN -Due to current hypotension, home regimen on hold, restart as patient stabilizes   HLD -Patient currently n.p.o. on BiPAP, home regimen on hold, restart as patient stabilizes   Best practice (evaluated daily)  Diet:  NPO Pain/Anxiety/Delirium protocol (if indicated): No VAP protocol (if indicated): Not indicated DVT prophylaxis: Subcutaneous Heparin GI prophylaxis: PPI Glucose control:  SSI No Central venous access:  Yes, indicated due to high dose vasopressors Arterial line:  N/A Foley:  N/A Mobility:  bed rest  PT consulted: N/A Last date of multidisciplinary goals of care discussion 02/27/21 Code Status:  full code Disposition: ICU  Labs   CBC: Recent Labs  Lab 02/26/21 1915 02/27/21 0319  WBC 19.1* 24.8*  NEUTROABS 17.8*  --   HGB 13.1 11.3*  HCT 42.7 36.1*  MCV 97.0 97.0  PLT 275 591    Basic Metabolic Panel: Recent Labs  Lab 02/26/21 1915 02/27/21 0319  NA 140 139  K 2.9* 3.2*  CL 104 106  CO2 21* 21*  GLUCOSE 115* 142*  BUN 35* 40*  CREATININE 3.05* 2.78*  CALCIUM 8.8* 7.8*  MG  --  0.5*  PHOS  --  3.5   GFR: Estimated Creatinine Clearance: 26.1 mL/min (A) (by C-G formula based on SCr of 2.78 mg/dL (H)). Recent Labs  Lab 02/26/21 1911 02/26/21 1915 02/26/21 1947 02/27/21 0155 02/27/21 0319  PROCALCITON  --   --  99.28  --  129.66  WBC  --  19.1*  --   --  24.8*  LATICACIDVEN 6.2*  7.4*  --   --  5.4*  --  Liver Function Tests: Recent Labs  Lab 02/26/21 1915  AST 27  ALT 11  ALKPHOS 59  BILITOT 0.5  PROT 6.8  ALBUMIN 3.5   No results for input(s): LIPASE, AMYLASE in the last 168 hours. No results for input(s): AMMONIA in the last 168 hours.  ABG    Component Value Date/Time   PHART 7.36 01/19/2016 2200   PCO2ART 51 (H) 01/19/2016 2200   PO2ART 84 01/19/2016 2200   HCO3 22.0 02/27/2021 0813   ACIDBASEDEF 5.0 (H) 02/27/2021  0813   O2SAT 86.0 02/27/2021 0813     Coagulation Profile: No results for input(s): INR, PROTIME in the last 168 hours.  Cardiac Enzymes: No results for input(s): CKTOTAL, CKMB, CKMBINDEX, TROPONINI in the last 168 hours.  HbA1C: Hemoglobin A1C  Date/Time Value Ref Range Status  09/25/2013 03:56 AM 6.3 4.2 - 6.3 % Final    Comment:    The American Diabetes Association recommends that a primary goal of therapy should be <7% and that physicians should reevaluate the treatment regimen in patients with HbA1c values consistently >8%.    Hgb A1c MFr Bld  Date/Time Value Ref Range Status  01/30/2021 06:25 AM 5.5 4.8 - 5.6 % Final    Comment:    (NOTE) Pre diabetes:          5.7%-6.4%  Diabetes:              >6.4%  Glycemic control for   <7.0% adults with diabetes   11/12/2020 11:14 PM 6.0 (H) 4.8 - 5.6 % Final    Comment:    (NOTE) Pre diabetes:          5.7%-6.4%  Diabetes:              >6.4%  Glycemic control for   <7.0% adults with diabetes     CBG: No results for input(s): GLUCAP in the last 168 hours.  Review of Systems: Positives in BOLD  Gen: Denies fever, chills, weight change, fatigue, night sweats HEENT: Denies blurred vision, double vision, hearing loss, tinnitus, sinus congestion, rhinorrhea, sore throat, neck stiffness, dysphagia PULM: Denies shortness of breath, cough, sputum production, hemoptysis, wheezing CV: Denies chest pain, edema, orthopnea, paroxysmal nocturnal dyspnea, palpitations GI: Denies abdominal pain, nausea, vomiting, diarrhea, hematochezia, melena, constipation, change in bowel habits GU: Denies dysuria, hematuria, polyuria, oliguria, urethral discharge Endocrine: Denies hot or cold intolerance, polyuria, polyphagia or appetite change Derm: Denies rash, dry skin, scaling or peeling skin change Heme: Denies easy bruising, bleeding, bleeding gums Neuro: Denies headache, numbness, weakness, slurred speech, loss of memory or  consciousness  Past Medical History:  He,  has a past medical history of Anemia, Anxiety, Arthritis, Asthma, Cancer ([place]), Chronic back pain, COPD (chronic obstructive pulmonary disease) ([place]), Depression, Diabetes mellitus ([place]), Dyspnea, GERD (gastroesophageal reflux disease), Gout, Gout, Headache, History of blood clots, History of kidney stones, Hyperlipidemia, Hyperlipidemia, Hypertension, Kidney stones, Neuropathy, On home oxygen therapy, Pneumonia (06/2017), Sleep apnea, and Ulcer of foot ([place]).   Surgical History:   Past Surgical History:  Procedure Laterality Date  . APPENDECTOMY    . DG FEET 2 VIEWS BILAT    . LIPOMA EXCISION Right 08/15/2017   Procedure: EXCISION TUMOR(CYST) FOOT;  Surgeon: Albertine Patricia, DPM;  Location: [facility] ORS;  Service: Podiatry;  Laterality: Right;  . OTHER SURGICAL HISTORY Bilateral Foot surgery     Social History:   reports that he has quit smoking. His smoking use included cigarettes. He started smoking about 4 years ago.  He smoked 0.50 packs per day. He has never used smokeless tobacco. He reports current alcohol use of about 1.0 standard drink of alcohol per week. He reports that he does not use drugs.   Family History:  His family history includes Hypertension in his mother.   Allergies Allergies  Allergen Reactions  . Bee Venom Anaphylaxis  . Hydrocodone-Acetaminophen Itching     Home Medications  Prior to Admission medications   Medication Sig Start Date End Date Taking? Authorizing Provider  albuterol (VENTOLIN HFA) 108 (90 Base) MCG/ACT inhaler Inhale 2 puffs into the lungs every 4 (four) hours as needed for wheezing or shortness of breath.    [provider]  alendronate (FOSAMAX) 70 MG tablet Take 70 mg by mouth once a week.  06/23/17   [provider]  allopurinol (ZYLOPRIM) 300 MG tablet Take 600 mg by mouth 2 (two) times daily. 02/24/20   [provider]  aspirin EC 81 MG tablet Take 1 tablet by mouth daily.     [provider]  buprenorphine-naloxone (SUBOXONE) 8-2 mg SUBL SL tablet Place 1 tablet under the tongue 3 (three) times daily.    [provider]  cholecalciferol (VITAMIN D) 1000 units tablet Take 1,000 Units by mouth daily.    [provider]  clopidogrel (PLAVIX) 75 MG tablet Take 1 tablet (75 mg total) by mouth daily. 02/02/21   Shawna Clamp, MD  DULoxetine (CYMBALTA) 30 MG capsule Take 1 capsule by mouth daily. 01/17/16   [provider]  ezetimibe (ZETIA) 10 MG tablet Take 10 mg by mouth daily. 02/24/20   [provider]  FARXIGA 10 MG TABS tablet Take 10 mg by mouth daily. 09/14/20   [provider]  ferrous sulfate 325 (65 FE) MG tablet Take 325 mg by mouth daily with breakfast.    [provider]  fluticasone (FLONASE) 50 MCG/ACT nasal spray Place 2 sprays into both nostrils daily. 12/18/15   [provider]  gabapentin (NEURONTIN) 800 MG tablet Take 800 mg by mouth 4 (four) times daily. 12/18/15   [provider]  hydrochlorothiazide (HYDRODIURIL) 12.5 MG tablet Take 1 tablet (12.5 mg total) by mouth daily. Patient taking differently: Take 25 mg by mouth daily. 11/18/20   Nolberto Hanlon, MD  ipratropium (ATROVENT) 0.02 % nebulizer solution Inhale 2.5 mL (500 mcg total) by nebulization every 6 (six) hours. 12/16/16 01/15/19  [provider]  KOMBIGLYZE XR 2.04-999 MG TB24 Take 2 tablets by mouth daily. 01/17/16   [provider]  metoprolol succinate (TOPROL-XL) 50 MG 24 hr tablet Take 50 mg by mouth daily. 02/24/20   [provider]  ondansetron (ZOFRAN) 4 MG tablet Take 4 mg by mouth every 4 (four) hours as needed for nausea or vomiting. 10/25/20   [provider]  OXYGEN Inhale 2 L into the lungs.    [provider]  pantoprazole (PROTONIX) 40 MG tablet Take 40 mg by mouth daily. 12/18/15   [provider]  simvastatin (ZOCOR) 40 MG tablet Take 1 tablet by mouth daily.  01/17/16   [provider]  TRELEGY ELLIPTA 100-62.5-25 MCG/INH AEPB Inhale 1 puff into the lungs daily. 12/31/19   [provider]     Critical care time: 35 minutes     Darel Hong, [facility] Pager: [phone]

## 2021-02-27 NOTE — Progress Notes (Signed)
Pt transported to ICU 14 on Bipap without incident. Pt remains on Bipap and tol well at this time. Report given to ICU RT.

## 2021-02-27 NOTE — Progress Notes (Signed)
Pharmacy Antibiotic Note  Christian Fields is a 65 y.o. male admitted on 02/26/2021 with pneumonia (CAP).  Pharmacy has been consulted for Cefepime dosing.  Plan: Ordered Cefepime 2gm q24h per indication and current renal fxn.  Pharmacy will continue to follow SCr and adjust dose if warranted.  Height: 5\' 9"  (175.3 cm) Weight: 74.4 kg (164 lb) IBW/kg (Calculated) : 70.7  Temp (24hrs), Avg:99.1 F (37.3 C), Min:99.1 F (37.3 C), Max:99.1 F (37.3 C)  Recent Labs  Lab 02/26/21 1911 02/26/21 1915  WBC  --  19.1*  CREATININE  --  3.05*  LATICACIDVEN 6.2*  7.4*  --     Estimated Creatinine Clearance: 23.8 mL/min (A) (by C-G formula based on SCr of 3.05 mg/dL (H)).    Allergies  Allergen Reactions  . Bee Venom Anaphylaxis  . Hydrocodone-Acetaminophen Itching    Antimicrobials this admission: 03/21 Azithromycin >>  03/21 Vancomycin >> X 1 03/21 Cefepime >>  Microbiology results: 03/21 BCx: Pending  Thank you for allowing pharmacy to be a part of this patient's care.  Renda Rolls, PharmD, [facility] 02/27/2021 4:22 AM

## 2021-02-27 NOTE — Progress Notes (Signed)
Pharmacy Antibiotic Note  Christian Fields is a 65 y.o. male admitted on 02/26/2021 with pneumonia (CAP).  Pharmacy has been consulted for vancomycin and zosyn dosing.  Plan: Vancomycin 2g x1 loaded on 3/21 2300; based on CrCl next dose due at ~24+ hrs even with recent improvement. Will dose vanc 1g x1 3/22 @2300  and continue to monitor renal function if schedule can be determined.  Initiate Zosyn 3.375g q8h will start in place of cefepime.  Height: 5\' 9"  (175.3 cm) Weight: 81.3 kg (179 lb 3.7 oz) IBW/kg (Calculated) : 70.7  Temp (24hrs), Avg:99.1 F (37.3 C), Min:99.1 F (37.3 C), Max:99.1 F (37.3 C)  Recent Labs  Lab 02/26/21 1911 02/26/21 1915 02/27/21 0155 02/27/21 0319 02/27/21 0813 02/27/21 0950 02/27/21 1500  WBC  --  19.1*  --  24.8*  --   --   --   CREATININE  --  3.05*  --  2.78*  --   --   --   LATICACIDVEN 6.2*   7.4*  --  5.4*  --  4.5* 6.1* 6.5*    Estimated Creatinine Clearance: 26.1 mL/min (A) (by C-G formula based on SCr of 2.78 mg/dL (H)).    Allergies  Allergen Reactions   Bee Venom Anaphylaxis   Hydrocodone-Acetaminophen Itching    Antimicrobials this admission: ongoing   Vancomycin (3/21>>  Zosyn (3/22>> completed  Azithromycin (3/21-22)   Cefepime (3/21-22)  Microbiology results: 03/21 BCx: Pending  Thank you for allowing pharmacy to be a part of this patients care.  Renda Rolls, PharmD, [facility] 02/27/2021 5:01 PM

## 2021-02-27 NOTE — Procedures (Signed)
Central Venous Catheter Insertion Procedure Note  Christian Fields  [id number]  [birth year]  Date:02/27/21  Time:3:11 AM   Provider Performing:Luisangel Wainright L Rust-Chester   Procedure: Insertion of Non-tunneled Central Venous [phone]) with US guidance (51102)   Indication(s) Medication administration  Consent Risks of the procedure as well as the alternatives and risks of each were explained to the patient and/or caregiver.  Consent for the procedure was obtained and is signed in the bedside chart  Anesthesia Topical only with 1% lidocaine   Timeout Verified patient identification, verified procedure, site/side was marked, verified correct patient position, special equipment/implants available, medications/allergies/relevant history reviewed, required imaging and test results available.  Sterile Technique Maximal sterile technique including full sterile barrier drape, hand hygiene, sterile gown, sterile gloves, mask, hair covering, sterile ultrasound probe cover (if used).  Procedure Description Area of catheter insertion was cleaned with chlorhexidine and draped in sterile fashion.  With real-time ultrasound guidance a central venous catheter was placed into the left femoral vein. Nonpulsatile blood flow and easy flushing noted in all ports.  The catheter was sutured in place and sterile dressing applied.  Complications/Tolerance None; patient tolerated the procedure well. Chest X-ray is ordered to verify placement for internal jugular or subclavian cannulation.   Chest x-ray is not ordered for femoral cannulation.  EBL Minimal  Specimen(s) None   Attempted left IJ placement, unable to thread guidewire, left femoral placement successful.  No complications at this time.  Domingo Pulse Rust-Chester, AGACNP-BC Acute Care Nurse Practitioner [facility]   [phone] / [phone] Please see Amion for pager details.

## 2021-02-28 ENCOUNTER — Inpatient Hospital Stay: Payer: Medicare HMO

## 2021-02-28 ENCOUNTER — Inpatient Hospital Stay (HOSPITAL_COMMUNITY)
Admit: 2021-02-28 | Discharge: 2021-02-28 | Disposition: A | Discharge status: Home / Self Care | Payer: Medicare HMO | Attending: Pulmonary Disease | Admitting: Pulmonary Disease

## 2021-02-28 DIAGNOSIS — I428 Other cardiomyopathies: Secondary | ICD-10-CM

## 2021-02-28 LAB — CBC WITH DIFFERENTIAL/PLATELET
Abs Immature Granulocytes: 5.07 10*3/uL — ABNORMAL HIGH (ref 0.00–0.07)
Basophils Absolute: 0.1 10*3/uL (ref 0.0–0.1)
Basophils Relative: 1 %
Eosinophils Absolute: 0.1 10*3/uL (ref 0.0–0.5)
Eosinophils Relative: 0 %
HCT: 27.7 % — ABNORMAL LOW (ref 39.0–52.0)
Hemoglobin: 9.4 g/dL — ABNORMAL LOW (ref 13.0–17.0)
Immature Granulocytes: 22 %
Lymphocytes Relative: 1 %
Lymphs Abs: 0.3 10*3/uL — ABNORMAL LOW (ref 0.7–4.0)
MCH: 31 pg (ref 26.0–34.0)
MCHC: 33.9 g/dL (ref 30.0–36.0)
MCV: 91.4 fL (ref 80.0–100.0)
Monocytes Absolute: 0.7 10*3/uL (ref 0.1–1.0)
Monocytes Relative: 3 %
Neutro Abs: 17.1 10*3/uL — ABNORMAL HIGH (ref 1.7–7.7)
Neutrophils Relative %: 73 %
Platelets: 148 10*3/uL — ABNORMAL LOW (ref 150–400)
RBC: 3.03 MIL/uL — ABNORMAL LOW (ref 4.22–5.81)
RDW: 15 % (ref 11.5–15.5)
Smear Review: NORMAL
WBC: 23.4 10*3/uL — ABNORMAL HIGH (ref 4.0–10.5)
nRBC: 0.3 % — ABNORMAL HIGH (ref 0.0–0.2)

## 2021-02-28 LAB — BASIC METABOLIC PANEL
Anion gap: 9 (ref 5–15)
BUN: 39 mg/dL — ABNORMAL HIGH (ref 8–23)
CO2: 25 mmol/L (ref 22–32)
Calcium: 7.5 mg/dL — ABNORMAL LOW (ref 8.9–10.3)
Chloride: 103 mmol/L (ref 98–111)
Creatinine, Ser: 1.68 mg/dL — ABNORMAL HIGH (ref 0.61–1.24)
GFR, Estimated: 45 mL/min — ABNORMAL LOW (ref >60)
Glucose, Bld: 156 mg/dL — ABNORMAL HIGH (ref 70–99)
Potassium: 3.4 mmol/L — ABNORMAL LOW (ref 3.5–5.1)
Sodium: 137 mmol/L (ref 135–145)

## 2021-02-28 LAB — GLUCOSE, CAPILLARY
Glucose-Capillary: 138 mg/dL — ABNORMAL HIGH (ref 70–99)
Glucose-Capillary: 122 mg/dL — ABNORMAL HIGH (ref 70–99)
Glucose-Capillary: 95 mg/dL (ref 70–99)
Glucose-Capillary: 92 mg/dL (ref 70–99)

## 2021-02-28 LAB — ECHOCARDIOGRAM COMPLETE
AR max vel: 3.03 cm2
AV Area VTI: 3.31 cm2
AV Area mean vel: 2.82 cm2
AV Mean grad: 4 mmHg
AV Peak grad: 5.9 mmHg
Ao pk vel: 1.21 m/s
Area-P 1/2: 7.66 cm2
Height: 69 in
S' Lateral: 2.98 cm
Weight: 2860.69 oz

## 2021-02-28 LAB — EXPECTORATED SPUTUM ASSESSMENT W GRAM STAIN, RFLX TO RESP C

## 2021-02-28 LAB — MRSA PCR SCREENING: MRSA by PCR: POSITIVE — AB

## 2021-02-28 LAB — MYCOPLASMA PNEUMONIAE ANTIBODY, IGM: Mycoplasma pneumo IgM: <770 U/mL (ref 0–769)

## 2021-02-28 LAB — MAGNESIUM: Magnesium: 1.7 mg/dL (ref 1.7–2.4)

## 2021-02-28 LAB — TROPONIN I (HIGH SENSITIVITY)
Troponin I (High Sensitivity): 56 ng/L — ABNORMAL HIGH (ref <18)
Troponin I (High Sensitivity): 42 ng/L — ABNORMAL HIGH (ref <18)

## 2021-02-28 LAB — PROCALCITONIN: Procalcitonin: >150 ng/mL

## 2021-02-28 LAB — PHOSPHORUS: Phosphorus: 2.8 mg/dL (ref 2.5–4.6)

## 2021-02-28 LAB — LACTIC ACID, PLASMA
Lactic Acid, Venous: 1.6 mmol/L (ref 0.5–1.9)
Lactic Acid, Venous: 1.3 mmol/L (ref 0.5–1.9)

## 2021-02-28 LAB — LEGIONELLA PNEUMOPHILA SEROGP 1 UR AG: L. pneumophila Serogp 1 Ur Ag: NEGATIVE

## 2021-02-28 MED ORDER — CALCIUM CARBONATE 1250 (500 CA) MG PO TABS
1000.0000 mg | ORAL_TABLET | Freq: Two times a day (BID) | ORAL | Status: AC
  Administered 2021-02-28: 1000 mg via ORAL
  Filled 2021-02-28: qty 2

## 2021-02-28 MED ORDER — SIMVASTATIN 20 MG PO TABS
40.0000 mg | ORAL_TABLET | Freq: Every day | ORAL | Status: DC
  Administered 2021-02-28 – 2021-03-09 (×10): 40 mg via ORAL
  Filled 2021-02-28 (×10): qty 2

## 2021-02-28 MED ORDER — MUPIROCIN 2 % EX OINT
1.0000 "application " | TOPICAL_OINTMENT | Freq: Two times a day (BID) | CUTANEOUS | Status: AC
  Administered 2021-02-28 – 2021-03-04 (×10): 1 via NASAL
  Filled 2021-02-28: qty 22

## 2021-02-28 MED ORDER — VANCOMYCIN HCL 1250 MG/250ML IV SOLN
1250.0000 mg | INTRAVENOUS | Status: DC
  Administered 2021-02-28 – 2021-03-02 (×3): 1250 mg via INTRAVENOUS
  Filled 2021-02-28 (×4): qty 250

## 2021-02-28 MED ORDER — EZETIMIBE 10 MG PO TABS
10.0000 mg | ORAL_TABLET | Freq: Every day | ORAL | Status: DC
  Administered 2021-02-28 – 2021-03-09 (×10): 10 mg via ORAL
  Filled 2021-02-28 (×10): qty 1

## 2021-02-28 MED ORDER — POTASSIUM CHLORIDE CRYS ER 20 MEQ PO TBCR
20.0000 meq | EXTENDED_RELEASE_TABLET | Freq: Once | ORAL | Status: AC
  Administered 2021-02-28: 20 meq via ORAL
  Filled 2021-02-28: qty 1

## 2021-02-28 MED ORDER — DULOXETINE HCL 30 MG PO CPEP
30.0000 mg | ORAL_CAPSULE | Freq: Every day | ORAL | Status: DC
  Administered 2021-02-28 – 2021-03-09 (×10): 30 mg via ORAL
  Filled 2021-02-28 (×11): qty 1

## 2021-02-28 MED ORDER — MAGNESIUM SULFATE 2 GM/50ML IV SOLN
2.0000 g | Freq: Once | INTRAVENOUS | Status: AC
  Administered 2021-02-28: 2 g via INTRAVENOUS
  Filled 2021-02-28: qty 50

## 2021-02-28 MED ORDER — ASPIRIN EC 81 MG PO TBEC
81.0000 mg | DELAYED_RELEASE_TABLET | Freq: Every day | ORAL | Status: DC
  Administered 2021-02-28 – 2021-03-09 (×10): 81 mg via ORAL
  Filled 2021-02-28 (×10): qty 1

## 2021-02-28 MED ORDER — MIDODRINE HCL 5 MG PO TABS
5.0000 mg | ORAL_TABLET | Freq: Three times a day (TID) | ORAL | Status: DC
  Administered 2021-02-28 – 2021-03-07 (×19): 5 mg via ORAL
  Filled 2021-02-28 (×20): qty 1

## 2021-02-28 MED ORDER — VITAMIN D 25 MCG (1000 UNIT) PO TABS
1000.0000 [IU] | ORAL_TABLET | Freq: Every day | ORAL | Status: DC
  Administered 2021-02-28 – 2021-03-09 (×10): 1000 [IU] via ORAL
  Filled 2021-02-28 (×10): qty 1

## 2021-02-28 NOTE — Progress Notes (Addendum)
Pharmacy Antibiotic Note  Christian Fields is a 65 y.o. male admitted on 02/26/2021 with pneumonia (CAP). Patient presented with SOB x 2 days. Has a PMH of left upper lobe lung cancer and COPD on chronic 2L [place]. Patient has AKI that is improving.  Pharmacy has been consulted for vancomycin and zosyn dosing.    3/16: CT chest noted nodules in left upper lobe. 3/22: CT abdomen showed new bilateral lobe airspace disease suspicious for pneumonia.  Assessment:  - Scr (baseline ~1.05): 3.05 > 2.78 > 1.68  - Lactic acid: 6.2 > 4.4 > 1.6  - Procal: 99.3 > 129.7 > >150  - WBC: 19.1 > 24.8 > 23.4   Vancomycin 2g x1 loaded on 3/21, 1g given 3/22 Zosyn replaced cefepime on 3/22   Day 2 ABX for both   Plan: - Vancomycin 1000mg  given 3/22 x 1 dose; will schedule dose of vancomycin 1250mg  q24h based on improving renal function and UOP --- Estimated AUC: 531.2, Cmin: 13.9 - Check vancomycin level at steady state with dose on 3/26  - Continue zosyn 3.375g q8h - Continue to monitor renal function daily  - F/u with BCx and susceptibilities for de-escalation  Height: 5\' 9"  (175.3 cm) Weight: 81.1 kg (178 lb 12.7 oz) IBW/kg (Calculated) : 70.7  Temp (24hrs), Avg:98.4 F (36.9 C), Min:98.3 F (36.8 C), Max:98.6 F (37 C)  Recent Labs  Lab 02/26/21 1915 02/27/21 0155 02/27/21 0319 02/27/21 0813 02/27/21 0950 02/27/21 1500 02/27/21 1819 02/28/21 0441 02/28/21 1034  WBC 19.1*  --  24.8*  --   --   --   --  23.4*  --   CREATININE 3.05*  --  2.78*  --   --   --   --  1.68*  --   LATICACIDVEN  --    < >  --  4.5* 6.1* 6.5* 4.4*  --  1.6   < > = values in this interval not displayed.    Estimated Creatinine Clearance: 43.3 mL/min (A) (by C-G formula based on SCr of 1.68 mg/dL (H)).    Allergies  Allergen Reactions  . Bee Venom Anaphylaxis    Antimicrobials this admission: ongoing   Vancomycin (3/21>>  Zosyn (3/22>> completed  Azithromycin (3/21-22)   Cefepime (3/21-22)  Dose  Adjustments this admission:  3/23 Vancomycin 1000mg   > 1250mg    Microbiology results:  03/21 BCx: Pending 03/22 MRSA PCR: positive 03/23 Sputum Cx: Pending   Thank you for allowing pharmacy to be a part of this patient's care.  Doreatha Massed, Student-PharmD 02/28/2021 1:17 PM

## 2021-02-28 NOTE — Progress Notes (Addendum)
NAME:  Christian Fields, MRN:  [id number], DOB:  02-22-1956, LOS: 2 ADMISSION DATE:  02/26/2021, CONSULTATION DATE:  02/26/2021 REFERRING MD:  Dr. Jari Pigg, CHIEF COMPLAINT: Shortness of breath  History of Present Illness:  65 year old male with known COPD on chronic 2 L nasal cannula at home and prior left upper lobe lung cancer status post radiation presents to the ED from home via EMS in acute hypoxic respiratory failure ultimately requiring BiPAP and vasopressors for septic shock. Per ED documentation, upon EMS arrival they were unable to obtain SPO2 readings.  Patient was placed on a nonrebreather for oxygen support with SPO2 readings in the high 80's.  He also received 2 duo nebs in route with EMS. ED course: Initial vitals-99.1, tachypneic at 25, tachycardic at 138, BP 130/116, SPO2 80% on nonrebreather at 15 L.  Patient was worked up for sepsis on arrival, receiving 2.5 liters with azithromycin and cefepime for CAP coverage.  Patient ultimately requiring BiPAP & vasopressor support. Significant labs: Procalcitonin elevated at 99.28, BNP elevated at 1149.5, lactic acidosis 7.4, hypokalemia at 2.9 & AKI: BUN 35/creatinine 3.05 elevated from 1.05 in February 2022. PCCM consulted for admission due to vasopressor administration & BiPAP support.  Pertinent  Medical History  COPD on chronic 2 L nasal cannula Type 2 diabetes mellitus Chronic back pain on Suboxone Hypertension Hyperlipidemia Left upper lobe cancer status post radiation Polysubstance abuse AAA  Significant Hospital Events: Including procedures, antibiotic start and stop dates in addition to other pertinent events   . 02/26/2021 -admit to ICU due to septic shock requiring vasopressor administration and BiPAP support. . 02/27/2021- Left femoral CVC placed due to high dose pressors. Tolerating BiPAP, VBG improving. ABX changed to Vancomycin & Zosyn due to worsening lactic acidosis . 02/28/2021- Fluids stopped overnight due to "sounding  wet", CXR without interim change.  Vasopressin weaned down, decreasing Levophed requirements, lactic acidosis resolved  Cultures:  3/21: SARS-CoV-2 PCR>>negative 3/21: Influenza PCR>>negative 3/21: Blood culture x2>> 3/22: Strep pneumo urinary antigen>>negative 3/22: Legionella urinary antigen>> 3/22: Mycoplasma Pneumoniae antibody>> 3/22: MRSA PCR>>positive 3/23: Sputum>>  Antimicrobials:  Azithromycin 3/21>>3/22 Cefepime 3/21>>3/22 Vancomycin 3/21 x1 dose, 3/22>> Zosyn 3/22>>  Interim History / Subjective:  -IV fluids stopped overnight due to pt "sounding wet". CXR with Persistent low lung volumes with bibasilar infiltrates/edema. No interim change. -Pt's HHFNC with significant amount of fluid in tubing making a audible crackling sound -Vasopressin weaned off, Levophed down to 8 mcg -On HHFNC 40% fiO2 -Pt awake and alert, eating breakfast -Reports intermittent mild chest pain (currently not present) ~ will obtain EKG and high sensitivity troponin -Also reports nonproductive cough and intermittent Nausea -Denies SOB, wheezing, HA, dizziness, palpitations, abdominal pain, vomiting, diarrhea   Objective   Blood pressure 108/66, pulse (!) 108, temperature 98.4 F (36.9 C), temperature source Axillary, resp. rate 19, height 5\' 9"  (1.753 m), weight 81.1 kg, SpO2 93 %.    FiO2 (%):  [40 %-45 %] 45 %   Intake/Output Summary (Last 24 hours) at 02/28/2021 0904 Last data filed at 02/28/2021 0600 Gross per 24 hour  Intake 3020.52 ml  Output 1125 ml  Net 1895.52 ml   Filed Weights   02/26/21 1903 02/27/21 0500 02/28/21 0454  Weight: 74.4 kg 81.3 kg 81.1 kg    Examination: General: Acutely ill-appearing male, sitting up in bed, on heated high flow nasal cannula, no acute distress HEENT: Atraumatic, normocephalic, neck supple, no JVD Neuro: Awake, alert and oriented x4, follows commands, no focal deficits, speech clear, pupils  PERRLA CV: Tachycardia, regular rhythm (sinus  tachycardia on telemetry) S1-S2, no murmurs, rubs, gallops, 2+ distal pulses Pulm: Diminished breath sounds bilaterally with rhonchi to bilateral bases, no wheezing, regular, even GI: Soft, nontender, nondistended, no guarding or rebound tenderness, bowel sounds positive x4 Skin: Warm and dry.  Scattered ecchymosis and abrasions throughout.  No obvious rashes, lesions or ulcerations Extremities: Warm and dry.  2+ distal pulses, no clubbing, 1+ bilateral lower extremity edema  Labs/imaging that I have personally reviewed   K 3.4, Glucose 156, BUN 39, Cr. 1.68, PCT >150, WBC 23.4, Hgb 9.4 3/22: CT Abdomen/Pelvis>>No evidence of acute inflammatory process or abscess within the abdomen or pelvis. New bilateral lower lobe airspace disease, suspicious for pneumonia. Increased size of 1.7 cm subcapsular lesion in posterior midpole of left kidney, which could represent a hemorrhagic cyst or solid renal mass. Abdomen MRI is recommended for further characterization. Mild increase in size of 3.8 cm infrarenal abdominal aortic aneurysm. Recommend follow-up every 2 years 3/23: CXR>>Heart size stable. Persistent low lung volumes with bibasilar infiltrates/edema. No interim change. No pleural effusion or pneumothorax. Reference is made to chest CT of 02/21/2021 for discussion of pulmonary nodules present. Old left lower rib fractures again noted.  Resolved Hospital Problem list     Assessment & Plan:   Septic shock>>improving Lactic: 7.4, Baseline PCT: 99 Initial interventions/workup included: 2.5 L of LR & Cefepime/azithromycin -Continuous cardiac monitoring -Maintain MAP >65 -Received IV fluid resuscitation in ED -Vasopressors as needed to maintain MAP goal  -Stress Dose steroids -Trend lactic acid (lactic acid now 1.6 today 02/28/21) -Echocardiogram pending   Severe Sepsis in the setting of Community Acquired Pneumonia -Monitor fever curve -Trend WBC's & Procalcitonin -Follow cultures as  above -Continue Vancomycin & Zosyn pending cultures & sensitivities -Urinalysis negative for UTI -CT Abdomen/Pelvis negative for any acute process   Acute Hypoxic Respiratory Failure secondary to CAP PMHx: COPD, LUL lung cancer s/p radiation, OSA-no home CPAP -Supplemental O2 as needed to maintain O2 sats 88 to 94% -Follow intermittent CXR & ABG as needed -Continue Vancomycin & Zosyn -Bronchodilators & Budesonide nebs   Acute Kidney Injury in the setting of septic shock>>slowly improving Hypokalemia Baseline Cr: 1.05, Cr on admission: 3.05 -Monitor I&O's / urinary output -Follow BMP -Ensure adequate renal perfusion -Avoid nephrotoxic agents as able -Replace electrolytes as indicated -Pharmacy consulted for assistance with electrolyte replacement -Renal US with Left renal cyst, NO hydronephrosis    Atypical Chest pain Mildly elevated Troponin, suspect demand ischemia -Continuous cardiac monitoring -Obtain EKG>>EKG without acute ischemic changes -Trend troponin (56>) -Continue home ASA & Plavix   Chronic back pain PMHx: polysubstance abuse Patient normally on Suboxone at home as well as gabapentin -Patient is requesting gabapentin, will restart -UDS positive for Benzodiazepines & Opiates   HTN -Due to current hypotension, home regimen on hold, restart as patient stabilizes   Diabetes Mellitus Type II with Hyperglycemia -CBG's -SSI -Follow ICU Hypo/Hyperglycemia protocol     Best practice (evaluated daily)  Diet:  Heart healthy/card modified Pain/Anxiety/Delirium protocol (if indicated): Prn Dilaudid VAP protocol (if indicated): Not indicated DVT prophylaxis: Subcutaneous Heparin GI prophylaxis: PPI Glucose control:  SSI Central venous access:  Yes, indicated due to vasopressors Arterial line:  N/A Foley:  N/A Mobility:  bed rest  PT consulted: N/A Last date of multidisciplinary goals of care discussion 02/27/21 Code Status:  full code Disposition:  ICU  Labs   CBC: Recent Labs  Lab 02/26/21 1915 02/27/21 0319 02/28/21 0441  WBC 19.1*  24.8* 23.4*  NEUTROABS 17.8*  --  17.1*  HGB 13.1 11.3* 9.4*  HCT 42.7 36.1* 27.7*  MCV 97.0 97.0 91.4  PLT 275 210 148*    Basic Metabolic Panel: Recent Labs  Lab 02/26/21 1915 02/27/21 0319 02/27/21 0950 02/27/21 1819 02/28/21 0441  NA 140 139  --   --  137  K 2.9* 3.2* 3.8 4.0 3.4*  CL 104 106  --   --  103  CO2 21* 21*  --   --  25  GLUCOSE 115* 142*  --   --  156*  BUN 35* 40*  --   --  39*  CREATININE 3.05* 2.78*  --   --  1.68*  CALCIUM 8.8* 7.8*  --   --  7.5*  MG  --  0.5* 2.3 1.7 1.7  PHOS  --  3.5  --   --  2.8   GFR: Estimated Creatinine Clearance: 43.3 mL/min (A) (by C-G formula based on SCr of 1.68 mg/dL (H)). Recent Labs  Lab 02/26/21 1915 02/26/21 1947 02/27/21 0155 02/27/21 0319 02/27/21 0813 02/27/21 0950 02/27/21 1500 02/27/21 1819 02/28/21 0441  PROCALCITON  --  99.28  --  129.66  --   --   --   --  >150.00  WBC 19.1*  --   --  24.8*  --   --   --   --  23.4*  LATICACIDVEN  --   --    < >  --  4.5* 6.1* 6.5* 4.4*  --    < > = values in this interval not displayed.    Liver Function Tests: Recent Labs  Lab 02/26/21 1915  AST 27  ALT 11  ALKPHOS 59  BILITOT 0.5  PROT 6.8  ALBUMIN 3.5   No results for input(s): LIPASE, AMYLASE in the last 168 hours. No results for input(s): AMMONIA in the last 168 hours.  ABG    Component Value Date/Time   PHART 7.36 01/19/2016 2200   PCO2ART 51 (H) 01/19/2016 2200   PO2ART 84 01/19/2016 2200   HCO3 22.0 02/27/2021 0813   ACIDBASEDEF 5.0 (H) 02/27/2021 0813   O2SAT 86.0 02/27/2021 0813     Coagulation Profile: No results for input(s): INR, PROTIME in the last 168 hours.  Cardiac Enzymes: No results for input(s): CKTOTAL, CKMB, CKMBINDEX, TROPONINI in the last 168 hours.  HbA1C: Hemoglobin A1C  Date/Time Value Ref Range Status  09/25/2013 03:56 AM 6.3 4.2 - 6.3 % Final    Comment:    The  American Diabetes Association recommends that a primary goal of therapy should be <7% and that physicians should reevaluate the treatment regimen in patients with HbA1c values consistently >8%.    Hgb A1c MFr Bld  Date/Time Value Ref Range Status  01/30/2021 06:25 AM 5.5 4.8 - 5.6 % Final    Comment:    (NOTE) Pre diabetes:          5.7%-6.4%  Diabetes:              >6.4%  Glycemic control for   <7.0% adults with diabetes   11/12/2020 11:14 PM 6.0 (H) 4.8 - 5.6 % Final    Comment:    (NOTE) Pre diabetes:          5.7%-6.4%  Diabetes:              >6.4%  Glycemic control for   <7.0% adults with diabetes     CBG: Recent Labs  Lab  02/27/21 0240 02/27/21 1735 02/27/21 2128 02/28/21 0739  GLUCAP 119* 191* 173* 138*    Review of Systems: Positives in BOLD  Gen: Denies fever, chills, weight change, fatigue, night sweats HEENT: Denies blurred vision, double vision, hearing loss, tinnitus, sinus congestion, rhinorrhea, sore throat, neck stiffness, dysphagia PULM: Denies shortness of breath, cough, sputum production, hemoptysis, wheezing CV: Denies chest pain, edema, orthopnea, paroxysmal nocturnal dyspnea, palpitations GI: Denies abdominal pain, nausea, vomiting, diarrhea, hematochezia, melena, constipation, change in bowel habits GU: Denies dysuria, hematuria, polyuria, oliguria, urethral discharge Endocrine: Denies hot or cold intolerance, polyuria, polyphagia or appetite change Derm: Denies rash, dry skin, scaling or peeling skin change Heme: Denies easy bruising, bleeding, bleeding gums Neuro: Denies headache, numbness, weakness, slurred speech, loss of memory or consciousness  Past Medical History:  He,  has a past medical history of Anemia, Anxiety, Arthritis, Asthma, Cancer ([place]), Chronic back pain, COPD (chronic obstructive pulmonary disease) ([place]), Depression, Diabetes mellitus ([place]), Dyspnea, GERD (gastroesophageal reflux disease), Gout, Gout, Headache, History of  blood clots, History of kidney stones, Hyperlipidemia, Hyperlipidemia, Hypertension, Kidney stones, Neuropathy, On home oxygen therapy, Pneumonia (06/2017), Sleep apnea, and Ulcer of foot ([place]).   Surgical History:   Past Surgical History:  Procedure Laterality Date  . APPENDECTOMY    . DG FEET 2 VIEWS BILAT    . LIPOMA EXCISION Right 08/15/2017   Procedure: EXCISION TUMOR(CYST) FOOT;  Surgeon: Albertine Patricia, DPM;  Location: [facility] ORS;  Service: Podiatry;  Laterality: Right;  . OTHER SURGICAL HISTORY Bilateral Foot surgery     Social History:   reports that he has quit smoking. His smoking use included cigarettes. He started smoking about 4 years ago. He smoked 0.50 packs per day. He has never used smokeless tobacco. He reports current alcohol use of about 1.0 standard drink of alcohol per week. He reports that he does not use drugs.   Family History:  His family history includes Hypertension in his mother.   Allergies Allergies  Allergen Reactions  . Bee Venom Anaphylaxis     Home Medications  Prior to Admission medications   Medication Sig Start Date End Date Taking? Authorizing Provider  albuterol (VENTOLIN HFA) 108 (90 Base) MCG/ACT inhaler Inhale 2 puffs into the lungs every 4 (four) hours as needed for wheezing or shortness of breath.    [provider]  alendronate (FOSAMAX) 70 MG tablet Take 70 mg by mouth once a week.  06/23/17   [provider]  allopurinol (ZYLOPRIM) 300 MG tablet Take 600 mg by mouth 2 (two) times daily. 02/24/20   [provider]  aspirin EC 81 MG tablet Take 1 tablet by mouth daily.    [provider]  buprenorphine-naloxone (SUBOXONE) 8-2 mg SUBL SL tablet Place 1 tablet under the tongue 3 (three) times daily.    [provider]  cholecalciferol (VITAMIN D) 1000 units tablet Take 1,000 Units by mouth daily.    [provider]  clopidogrel (PLAVIX) 75 MG tablet Take 1 tablet (75 mg total) by mouth  daily. 02/02/21   Shawna Clamp, MD  DULoxetine (CYMBALTA) 30 MG capsule Take 1 capsule by mouth daily. 01/17/16   [provider]  ezetimibe (ZETIA) 10 MG tablet Take 10 mg by mouth daily. 02/24/20   [provider]  FARXIGA 10 MG TABS tablet Take 10 mg by mouth daily. 09/14/20   [provider]  ferrous sulfate 325 (65 FE) MG tablet Take 325 mg by mouth daily with breakfast.  [provider]  fluticasone (FLONASE) 50 MCG/ACT nasal spray Place 2 sprays into both nostrils daily. 12/18/15   [provider]  gabapentin (NEURONTIN) 800 MG tablet Take 800 mg by mouth 4 (four) times daily. 12/18/15   [provider]  hydrochlorothiazide (HYDRODIURIL) 12.5 MG tablet Take 1 tablet (12.5 mg total) by mouth daily. Patient taking differently: Take 25 mg by mouth daily. 11/18/20   Nolberto Hanlon, MD  ipratropium (ATROVENT) 0.02 % nebulizer solution Inhale 2.5 mL (500 mcg total) by nebulization every 6 (six) hours. 12/16/16 01/15/19  [provider]  KOMBIGLYZE XR 2.04-999 MG TB24 Take 2 tablets by mouth daily. 01/17/16   [provider]  metoprolol succinate (TOPROL-XL) 50 MG 24 hr tablet Take 50 mg by mouth daily. 02/24/20   [provider]  ondansetron (ZOFRAN) 4 MG tablet Take 4 mg by mouth every 4 (four) hours as needed for nausea or vomiting. 10/25/20   [provider]  OXYGEN Inhale 2 L into the lungs.    [provider]  pantoprazole (PROTONIX) 40 MG tablet Take 40 mg by mouth daily. 12/18/15   [provider]  simvastatin (ZOCOR) 40 MG tablet Take 1 tablet by mouth daily. 01/17/16   [provider]  TRELEGY ELLIPTA 100-62.5-25 MCG/INH AEPB Inhale 1 puff into the lungs daily. 12/31/19   [provider]     Critical care time: 32 minutes     Darel Hong, [facility] [place] [facility] Pager: [phone]

## 2021-02-28 NOTE — Progress Notes (Addendum)
Patient lungs sounded "wet" at 2330. NP notified, rate of lactated ringers decreased from 142mL/hour to 81mL/hour. During my 0400 assessment the patient was awake and lung sounds were audible without stethoscope upon walking into the patients room. Patient stated that he was not having difficulty breathing and that he did not feel any congestion in his chest. Patient coughed to get some of the fluid up and spit out a moderate amount of pink frothy sputum. Lactated ringers were turned off and NP was notified. NP put in orders for a portable chest x-ray and for lactated ringers to be discontinued. Currently awaiting the results from the portable x-ray. Will continue patient care.

## 2021-02-28 NOTE — Progress Notes (Signed)
*  PRELIMINARY RESULTS* Echocardiogram 2D Echocardiogram has been performed.  Sherrie Sport 02/28/2021, 2:46 PM

## 2021-02-28 NOTE — Progress Notes (Addendum)
PHARMACY CONSULT NOTE - FOLLOW UP  Pharmacy Consult for Electrolyte Monitoring and Replacement   Recent Labs: Potassium (mmol/L)  Date Value  02/28/2021 3.4 (L)  05/17/2014 3.5   Magnesium (mg/dL)  Date Value  02/28/2021 1.7   Calcium (mg/dL)  Date Value  02/28/2021 7.5 (L)   Calcium, Total (mg/dL)  Date Value  03/31/2014 9.3   Albumin (g/dL)  Date Value  02/26/2021 3.5  09/24/2013 3.6   Phosphorus (mg/dL)  Date Value  02/28/2021 2.8   Sodium (mmol/L)  Date Value  02/28/2021 137  03/31/2014 136    Assessment: 65 yo m presented with shortness of breath concerning for CAP. PMH includes COPD, diabetes, L upper lobe lung cancer s/p radiation, chronic back pain. Pharmacy consulted to assist in electrolyte management.    Goal of Therapy:  Electrolytes WNL  Plan:  --Ca 7.5 - Give calcium carbonate 1g elemental calcium PO x 1 dose --Mg 1.7 - Give 2g x 1 dose  --K 3.4 - Give KCl 20 mEq PO x 1 dose   --Monitor electrolytes with AM labs  Doreatha Massed, Student - PharmD 02/28/2021 7:07 AM

## 2021-03-01 DIAGNOSIS — R6521 Severe sepsis with septic shock: Secondary | ICD-10-CM

## 2021-03-01 DIAGNOSIS — A419 Sepsis, unspecified organism: Principal | ICD-10-CM

## 2021-03-01 DIAGNOSIS — J9601 Acute respiratory failure with hypoxia: Secondary | ICD-10-CM

## 2021-03-01 LAB — CBC WITH DIFFERENTIAL/PLATELET
Abs Immature Granulocytes: 0.12 10*3/uL — ABNORMAL HIGH (ref 0.00–0.07)
Basophils Absolute: 0.2 10*3/uL — ABNORMAL HIGH (ref 0.0–0.1)
Basophils Relative: 1 %
Eosinophils Absolute: 0 10*3/uL (ref 0.0–0.5)
Eosinophils Relative: 0 %
HCT: 26.5 % — ABNORMAL LOW (ref 39.0–52.0)
Hemoglobin: 8.7 g/dL — ABNORMAL LOW (ref 13.0–17.0)
Immature Granulocytes: 1 %
Lymphocytes Relative: 2 %
Lymphs Abs: 0.4 10*3/uL — ABNORMAL LOW (ref 0.7–4.0)
MCH: 30.5 pg (ref 26.0–34.0)
MCHC: 32.8 g/dL (ref 30.0–36.0)
MCV: 93 fL (ref 80.0–100.0)
Monocytes Absolute: 0.6 10*3/uL (ref 0.1–1.0)
Monocytes Relative: 3 %
Neutro Abs: 22.7 10*3/uL — ABNORMAL HIGH (ref 1.7–7.7)
Neutrophils Relative %: 93 %
Platelets: 128 10*3/uL — ABNORMAL LOW (ref 150–400)
RBC: 2.85 MIL/uL — ABNORMAL LOW (ref 4.22–5.81)
RDW: 15.1 % (ref 11.5–15.5)
Smear Review: NORMAL
WBC: 23.9 10*3/uL — ABNORMAL HIGH (ref 4.0–10.5)
nRBC: 0.2 % (ref 0.0–0.2)

## 2021-03-01 LAB — BASIC METABOLIC PANEL
Anion gap: 13 (ref 5–15)
BUN: 39 mg/dL — ABNORMAL HIGH (ref 8–23)
CO2: 24 mmol/L (ref 22–32)
Calcium: 7.8 mg/dL — ABNORMAL LOW (ref 8.9–10.3)
Chloride: 102 mmol/L (ref 98–111)
Creatinine, Ser: 1.46 mg/dL — ABNORMAL HIGH (ref 0.61–1.24)
GFR, Estimated: 53 mL/min — ABNORMAL LOW (ref >60)
Glucose, Bld: 95 mg/dL (ref 70–99)
Potassium: 2.6 mmol/L — CL (ref 3.5–5.1)
Sodium: 139 mmol/L (ref 135–145)

## 2021-03-01 LAB — GLUCOSE, CAPILLARY
Glucose-Capillary: 103 mg/dL — ABNORMAL HIGH (ref 70–99)
Glucose-Capillary: 90 mg/dL (ref 70–99)
Glucose-Capillary: 166 mg/dL — ABNORMAL HIGH (ref 70–99)
Glucose-Capillary: 92 mg/dL (ref 70–99)

## 2021-03-01 LAB — MAGNESIUM: Magnesium: 2.1 mg/dL (ref 1.7–2.4)

## 2021-03-01 LAB — POTASSIUM: Potassium: 3 mmol/L — ABNORMAL LOW (ref 3.5–5.1)

## 2021-03-01 LAB — PHOSPHORUS: Phosphorus: 2.5 mg/dL (ref 2.5–4.6)

## 2021-03-01 MED ORDER — ALPRAZOLAM 0.25 MG PO TABS
0.2500 mg | ORAL_TABLET | Freq: Three times a day (TID) | ORAL | Status: DC | PRN
  Administered 2021-03-01 – 2021-03-08 (×3): 0.25 mg via ORAL
  Filled 2021-03-01 (×3): qty 1

## 2021-03-01 MED ORDER — POTASSIUM CHLORIDE CRYS ER 20 MEQ PO TBCR
40.0000 meq | EXTENDED_RELEASE_TABLET | Freq: Once | ORAL | Status: AC
  Administered 2021-03-01: 40 meq via ORAL
  Filled 2021-03-01: qty 2

## 2021-03-01 MED ORDER — POTASSIUM CHLORIDE 10 MEQ/100ML IV SOLN
10.0000 meq | INTRAVENOUS | Status: AC
Start: 2021-03-01 — End: 2021-03-01
  Administered 2021-03-01 (×4): 10 meq via INTRAVENOUS
  Filled 2021-03-01 (×5): qty 100

## 2021-03-01 MED ORDER — HYDROMORPHONE HCL 2 MG PO TABS
2.0000 mg | ORAL_TABLET | Freq: Four times a day (QID) | ORAL | Status: DC
  Administered 2021-03-01 – 2021-03-08 (×26): 2 mg via ORAL
  Filled 2021-03-01 (×28): qty 1

## 2021-03-01 NOTE — Progress Notes (Signed)
Interesting day. Up to [facility] x2 for 1 BM. Talkative and very anxious. New order for prn Xanax -started. Patient calmer now. Slept a short nap this afternoon. Left femoral CVC removed per order.

## 2021-03-01 NOTE — Progress Notes (Signed)
Hinton Dyer NP and pharmacy notified of critical potasium of 2.6.

## 2021-03-01 NOTE — Progress Notes (Signed)
NAME:  Christian Fields, MRN:  [id number], DOB:  1956/06/14, LOS: 3 ADMISSION DATE:  02/26/2021, CONSULTATION DATE:  02/26/2021 REFERRING MD:  Dr. Jari Pigg, CHIEF COMPLAINT: Shortness of breath  History of Present Illness:  65 year old male with known COPD on chronic 2 L nasal cannula at home and prior left upper lobe lung cancer status post radiation presents to the ED from home via EMS in acute hypoxic respiratory failure ultimately requiring BiPAP and vasopressors for septic shock. Per ED documentation, upon EMS arrival they were unable to obtain SPO2 readings.  Patient was placed on a nonrebreather for oxygen support with SPO2 readings in the high 80's.  He also received 2 duo nebs in route with EMS. ED course: Initial vitals-99.1, tachypneic at 25, tachycardic at 138, BP 130/116, SPO2 80% on nonrebreather at 15 L.  Patient was worked up for sepsis on arrival, receiving 2.5 liters with azithromycin and cefepime for CAP coverage.  Patient ultimately requiring BiPAP & vasopressor support. Significant labs: Procalcitonin elevated at 99.28, BNP elevated at 1149.5, lactic acidosis 7.4, hypokalemia at 2.9 & AKI: BUN 35/creatinine 3.05 elevated from 1.05 in February 2022. PCCM consulted for admission due to vasopressor administration & BiPAP support.  Pertinent  Medical History  COPD on chronic 2 L nasal cannula Type 2 diabetes mellitus Chronic back pain on Suboxone Hypertension Hyperlipidemia Left upper lobe cancer status post radiation Polysubstance abuse AAA  Significant Hospital Events: Including procedures, antibiotic start and stop dates in addition to other pertinent events   . 02/26/2021 -admit to ICU due to septic shock requiring vasopressor administration and BiPAP support. . 02/27/2021- Left femoral CVC placed due to high dose pressors. Tolerating BiPAP, VBG improving. ABX changed to Vancomycin & Zosyn due to worsening lactic acidosis . 02/28/2021- Fluids stopped overnight due to "sounding  wet", CXR without interim change.  Vasopressin weaned down, decreasing Levophed requirements, lactic acidosis resolved  Cultures:  3/21: SARS-CoV-2 PCR>>negative 3/21: Influenza PCR>>negative 3/21: Blood culture x2>> 3/22: Strep pneumo urinary antigen>>negative 3/22: Legionella urinary antigen>> 3/22: Mycoplasma Pneumoniae antibody>> 3/22: MRSA PCR>>positive 3/23: Sputum>>  Antimicrobials:  Azithromycin 3/21>>3/22 Cefepime 3/21>>3/22 Vancomycin 3/21 x1 dose, 3/22>> Zosyn 3/22>>  Interim History / Subjective:  -IV fluids stopped overnight due to pt "sounding wet". CXR with Persistent low lung volumes with bibasilar infiltrates/edema. No interim change. -Pt's HHFNC with significant amount of fluid in tubing making a audible crackling sound -Vasopressin weaned off, Levophed down to 8 mcg -On HHFNC 40% fiO2 -Pt awake and alert, eating breakfast -Reports intermittent mild chest pain (currently not present) ~ will obtain EKG and high sensitivity troponin -Also reports nonproductive cough and intermittent Nausea -Denies SOB, wheezing, HA, dizziness, palpitations, abdominal pain, vomiting, diarrhea   Objective   Blood pressure 126/72, pulse 92, temperature 98.8 F (37.1 C), resp. rate (!) 21, height 5\' 9"  (1.753 m), weight 83.2 kg, SpO2 95 %.    FiO2 (%):  [40 %] 40 %   Intake/Output Summary (Last 24 hours) at 03/01/2021 1727 Last data filed at 03/01/2021 1700 Gross per 24 hour  Intake 2259.46 ml  Output 4301 ml  Net -2041.54 ml   Filed Weights   02/27/21 0500 02/28/21 0454 03/01/21 0500  Weight: 81.3 kg 81.1 kg 83.2 kg    Examination: General: Acutely ill-appearing male, sitting up in bed, on heated high flow nasal cannula, no acute distress HEENT: Atraumatic, normocephalic, neck supple, no JVD Neuro: Awake, alert and oriented x4, follows commands, no focal deficits, speech clear, pupils PERRLA CV: Tachycardia, regular  rhythm (sinus tachycardia on telemetry) S1-S2, no  murmurs, rubs, gallops, 2+ distal pulses Pulm: Diminished breath sounds bilaterally with rhonchi to bilateral bases, no wheezing, regular, even GI: Soft, nontender, nondistended, no guarding or rebound tenderness, bowel sounds positive x4 Skin: Warm and dry.  Scattered ecchymosis and abrasions throughout.  No obvious rashes, lesions or ulcerations Extremities: Warm and dry.  2+ distal pulses, no clubbing, 1+ bilateral lower extremity edema  Labs/imaging that I have personally reviewed   K 3.4, Glucose 156, BUN 39, Cr. 1.68, PCT >150, WBC 23.4, Hgb 9.4 3/22: CT Abdomen/Pelvis>>No evidence of acute inflammatory process or abscess within the abdomen or pelvis. New bilateral lower lobe airspace disease, suspicious for pneumonia. Increased size of 1.7 cm subcapsular lesion in posterior midpole of left kidney, which could represent a hemorrhagic cyst or solid renal mass. Abdomen MRI is recommended for further characterization. Mild increase in size of 3.8 cm infrarenal abdominal aortic aneurysm. Recommend follow-up every 2 years 3/23: CXR>>Heart size stable. Persistent low lung volumes with bibasilar infiltrates/edema. No interim change. No pleural effusion or pneumothorax. Reference is made to chest CT of 02/21/2021 for discussion of pulmonary nodules present. Old left lower rib fractures again noted.  Resolved Hospital Problem list     Assessment & Plan:   Septic shock>>improving Lactic: 7.4, Baseline PCT: 99 Initial interventions/workup included: 2.5 L of LR & Cefepime/azithromycin -Continuous cardiac monitoring -Maintain MAP >65, remains off pressors -Stress Dose steroids off -Trend lactic acid (lactic acid now normal) -Echocardiogram EF 55-60%   Severe Sepsis in the setting of Community Acquired Pneumonia -Monitor fever curve -Trend WBC's remain flat in mid 20s -F/U PCT -Follow cultures as above -Continue Vancomycin & Zosyn pending cultures & sensitivities -Urinalysis  negative for UTI -CT Abdomen/Pelvis negative for any acute process   Acute Hypoxic Respiratory Failure secondary to CAP PMHx: COPD, LUL lung cancer s/p radiation, OSA-no home CPAP -Supplemental O2 as needed to maintain O2 sats 88 to 94% -Follow intermittent CXR & ABG as needed -Continue Vancomycin & Zosyn -Bronchodilators & Budesonide nebs   Acute Kidney Injury in the setting of septic shock>>slowly improving Hypokalemia Baseline Cr: 1.05, Cr on admission: 3.05 -Monitor I&O's / urinary output -Follow BMP, creatinine <1.5 now -Ensure adequate renal perfusion -Avoid nephrotoxic agents as able -Replace electrolytes as indicated -Pharmacy consulted for assistance with electrolyte replacement -Renal US with Left renal cyst, NO hydronephrosis    Atypical Chest pain Mildly elevated Troponin, suspect demand ischemia -Continuous cardiac monitoring -Obtain EKG>>EKG without acute ischemic changes -Trend troponin (56>) -Continue home ASA & Plavix   Chronic back pain PMHx: polysubstance abuse Patient normally on Suboxone at home as well as gabapentin -Patient is requesting gabapentin, will restart -UDS positive for Benzodiazepines & Opiates   HTN -Due to current hypotension, home regimen on hold, restart as patient stabilizes   Diabetes Mellitus Type II with Hyperglycemia -CBG's -SSI -Follow ICU Hypo/Hyperglycemia protocol     Best practice (evaluated daily)  Diet:  Heart healthy/card modified Pain/Anxiety/Delirium protocol (if indicated): Prn Dilaudid VAP protocol (if indicated): Not indicated DVT prophylaxis: Subcutaneous Heparin GI prophylaxis: PPI Glucose control:  SSI Central venous access:  Yes, indicated due to vasopressors Arterial line:  N/A Foley:  N/A Mobility:  bed rest  PT consulted: N/A Last date of multidisciplinary goals of care discussion 02/27/21 Code Status:  full code Disposition: stable for the floor, patient placement notified  Labs    CBC: Recent Labs  Lab 02/26/21 1915 02/27/21 0319 02/28/21 0441 03/01/21 0528  WBC  19.1* 24.8* 23.4* 23.9*  NEUTROABS 17.8*  --  17.1* 22.7*  HGB 13.1 11.3* 9.4* 8.7*  HCT 42.7 36.1* 27.7* 26.5*  MCV 97.0 97.0 91.4 93.0  PLT 275 210 148* 128*    Basic Metabolic Panel: Recent Labs  Lab 02/26/21 1915 02/27/21 0319 02/27/21 0950 02/27/21 1819 02/28/21 0441 03/01/21 0528 03/01/21 1554  NA 140 139  --   --  137 139  --   K 2.9* 3.2* 3.8 4.0 3.4* 2.6* 3.0*  CL 104 106  --   --  103 102  --   CO2 21* 21*  --   --  25 24  --   GLUCOSE 115* 142*  --   --  156* 95  --   BUN 35* 40*  --   --  39* 39*  --   CREATININE 3.05* 2.78*  --   --  1.68* 1.46*  --   CALCIUM 8.8* 7.8*  --   --  7.5* 7.8*  --   MG  --  0.5* 2.3 1.7 1.7 2.1  --   PHOS  --  3.5  --   --  2.8 2.5  --    GFR: Estimated Creatinine Clearance: 49.8 mL/min (A) (by C-G formula based on SCr of 1.46 mg/dL (H)). Recent Labs  Lab 02/26/21 1915 02/26/21 1947 02/27/21 0155 02/27/21 0319 02/27/21 0813 02/27/21 1500 02/27/21 1819 02/28/21 0441 02/28/21 1034 02/28/21 1333 03/01/21 0528  PROCALCITON  --  99.28  --  129.66  --   --   --  >150.00  --   --   --   WBC 19.1*  --   --  24.8*  --   --   --  23.4*  --   --  23.9*  LATICACIDVEN  --   --    < >  --    < > 6.5* 4.4*  --  1.6 1.3  --    < > = values in this interval not displayed.    Liver Function Tests: Recent Labs  Lab 02/26/21 1915  AST 27  ALT 11  ALKPHOS 59  BILITOT 0.5  PROT 6.8  ALBUMIN 3.5   No results for input(s): LIPASE, AMYLASE in the last 168 hours. No results for input(s): AMMONIA in the last 168 hours.  ABG    Component Value Date/Time   PHART 7.36 01/19/2016 2200   PCO2ART 51 (H) 01/19/2016 2200   PO2ART 84 01/19/2016 2200   HCO3 22.0 02/27/2021 0813   ACIDBASEDEF 5.0 (H) 02/27/2021 0813   O2SAT 86.0 02/27/2021 0813     Coagulation Profile: No results for input(s): INR, PROTIME in the last 168 hours.  Cardiac  Enzymes: No results for input(s): CKTOTAL, CKMB, CKMBINDEX, TROPONINI in the last 168 hours.  HbA1C: Hemoglobin A1C  Date/Time Value Ref Range Status  09/25/2013 03:56 AM 6.3 4.2 - 6.3 % Final    Comment:    The American Diabetes Association recommends that a primary goal of therapy should be <7% and that physicians should reevaluate the treatment regimen in patients with HbA1c values consistently >8%.    Hgb A1c MFr Bld  Date/Time Value Ref Range Status  01/30/2021 06:25 AM 5.5 4.8 - 5.6 % Final    Comment:    (NOTE) Pre diabetes:          5.7%-6.4%  Diabetes:              >6.4%  Glycemic control for   <7.0%  adults with diabetes   11/12/2020 11:14 PM 6.0 (H) 4.8 - 5.6 % Final    Comment:    (NOTE) Pre diabetes:          5.7%-6.4%  Diabetes:              >6.4%  Glycemic control for   <7.0% adults with diabetes     CBG: Recent Labs  Lab 02/28/21 1650 02/28/21 2137 03/01/21 0738 03/01/21 1114 03/01/21 1515  GLUCAP 95 92 103* 90 166*    Review of Systems: Positives in BOLD  Gen: Denies fever, chills, weight change, fatigue, night sweats HEENT: Denies blurred vision, double vision, hearing loss, tinnitus, sinus congestion, rhinorrhea, sore throat, neck stiffness, dysphagia PULM: Denies shortness of breath, cough, sputum production, hemoptysis, wheezing CV: Denies chest pain, edema, orthopnea, paroxysmal nocturnal dyspnea, palpitations GI: Denies abdominal pain, nausea, vomiting, diarrhea, hematochezia, melena, constipation, change in bowel habits GU: Denies dysuria, hematuria, polyuria, oliguria, urethral discharge Endocrine: Denies hot or cold intolerance, polyuria, polyphagia or appetite change Derm: Denies rash, dry skin, scaling or peeling skin change Heme: Denies easy bruising, bleeding, bleeding gums Neuro: Denies headache, numbness, weakness, slurred speech, loss of memory or consciousness  Past Medical History:  He,  has a past medical history of  Anemia, Anxiety, Arthritis, Asthma, Cancer ([place]), Chronic back pain, COPD (chronic obstructive pulmonary disease) ([place]), Depression, Diabetes mellitus ([place]), Dyspnea, GERD (gastroesophageal reflux disease), Gout, Gout, Headache, History of blood clots, History of kidney stones, Hyperlipidemia, Hyperlipidemia, Hypertension, Kidney stones, Neuropathy, On home oxygen therapy, Pneumonia (06/2017), Sleep apnea, and Ulcer of foot ([place]).   Surgical History:   Past Surgical History:  Procedure Laterality Date  . APPENDECTOMY    . DG FEET 2 VIEWS BILAT    . LIPOMA EXCISION Right 08/15/2017   Procedure: EXCISION TUMOR(CYST) FOOT;  Surgeon: Albertine Patricia, DPM;  Location: [facility] ORS;  Service: Podiatry;  Laterality: Right;  . OTHER SURGICAL HISTORY Bilateral Foot surgery     Social History:   reports that he has quit smoking. His smoking use included cigarettes. He started smoking about 4 years ago. He smoked 0.50 packs per day. He has never used smokeless tobacco. He reports current alcohol use of about 1.0 standard drink of alcohol per week. He reports that he does not use drugs.   Family History:  His family history includes Hypertension in his mother.   Allergies Allergies  Allergen Reactions  . Bee Venom Anaphylaxis     Home Medications  Prior to Admission medications   Medication Sig Start Date End Date Taking? Authorizing Provider  albuterol (VENTOLIN HFA) 108 (90 Base) MCG/ACT inhaler Inhale 2 puffs into the lungs every 4 (four) hours as needed for wheezing or shortness of breath.    [provider]  alendronate (FOSAMAX) 70 MG tablet Take 70 mg by mouth once a week.  06/23/17   [provider]  allopurinol (ZYLOPRIM) 300 MG tablet Take 600 mg by mouth 2 (two) times daily. 02/24/20   [provider]  aspirin EC 81 MG tablet Take 1 tablet by mouth daily.    [provider]  buprenorphine-naloxone (SUBOXONE) 8-2 mg SUBL SL tablet Place 1 tablet under the  tongue 3 (three) times daily.    [provider]  cholecalciferol (VITAMIN D) 1000 units tablet Take 1,000 Units by mouth daily.    [provider]  clopidogrel (PLAVIX) 75 MG tablet Take 1 tablet (75 mg total) by mouth daily. 02/02/21   Shawna Clamp, MD  DULoxetine (CYMBALTA) 30 MG capsule Take 1 capsule by mouth daily. 01/17/16   [provider]  ezetimibe (ZETIA) 10 MG tablet Take 10 mg by mouth daily. 02/24/20   [provider]  FARXIGA 10 MG TABS tablet Take 10 mg by mouth daily. 09/14/20   [provider]  ferrous sulfate 325 (65 FE) MG tablet Take 325 mg by mouth daily with breakfast.    [provider]  fluticasone (FLONASE) 50 MCG/ACT nasal spray Place 2 sprays into both nostrils daily. 12/18/15   [provider]  gabapentin (NEURONTIN) 800 MG tablet Take 800 mg by mouth 4 (four) times daily. 12/18/15   [provider]  hydrochlorothiazide (HYDRODIURIL) 12.5 MG tablet Take 1 tablet (12.5 mg total) by mouth daily. Patient taking differently: Take 25 mg by mouth daily. 11/18/20   Nolberto Hanlon, MD  ipratropium (ATROVENT) 0.02 % nebulizer solution Inhale 2.5 mL (500 mcg total) by nebulization every 6 (six) hours. 12/16/16 01/15/19  [provider]  KOMBIGLYZE XR 2.04-999 MG TB24 Take 2 tablets by mouth daily. 01/17/16   [provider]  metoprolol succinate (TOPROL-XL) 50 MG 24 hr tablet Take 50 mg by mouth daily. 02/24/20   [provider]  ondansetron (ZOFRAN) 4 MG tablet Take 4 mg by mouth every 4 (four) hours as needed for nausea or vomiting. 10/25/20   [provider]  OXYGEN Inhale 2 L into the lungs.    [provider]  pantoprazole (PROTONIX) 40 MG tablet Take 40 mg by mouth daily. 12/18/15   [provider]  simvastatin (ZOCOR) 40 MG tablet Take 1 tablet by mouth daily. 01/17/16   [provider]  TRELEGY ELLIPTA 100-62.5-25 MCG/INH AEPB Inhale 1 puff into the lungs  daily. 12/31/19   [provider]     Critical care time: 32 minutes     Darel Hong, [facility] Pager: [phone]

## 2021-03-01 NOTE — Progress Notes (Signed)
Pharmacist aware of potassium of 2.6, stated he will try to get to it, otherwise day shift pharmacist will treat it.

## 2021-03-01 NOTE — Progress Notes (Signed)
PHARMACY CONSULT NOTE - FOLLOW UP  Pharmacy Consult for Electrolyte Monitoring and Replacement   Recent Labs: Potassium (mmol/L)  Date Value  03/01/2021 2.6 (LL)  05/17/2014 3.5   Magnesium (mg/dL)  Date Value  03/01/2021 2.1   Calcium (mg/dL)  Date Value  03/01/2021 7.8 (L)   Calcium, Total (mg/dL)  Date Value  03/31/2014 9.3   Albumin (g/dL)  Date Value  02/26/2021 3.5  09/24/2013 3.6   Phosphorus (mg/dL)  Date Value  03/01/2021 2.5   Sodium (mmol/L)  Date Value  03/01/2021 139  03/31/2014 136    Assessment: 65 yo m presented with shortness of breath concerning for CAP. PMH includes COPD, diabetes, L upper lobe lung cancer s/p radiation, chronic back pain. Pharmacy consulted to assist in electrolyte management.   Goal of Therapy:  Electrolytes WNL  Plan:  -- K 2.6 - Will give 10 mEq IV x 4 and 40 mEq PO x 1 dose  -- Recheck K @ 1500  -- Ca: 7.5 >7.8, replaced 3/23 - continue to monitor  -- Monitor electrolytes with AM labs  Doreatha Massed, Student - PharmD 03/01/2021 7:24 AM

## 2021-03-01 NOTE — Progress Notes (Signed)
PHARMACY CONSULT NOTE - FOLLOW UP  Pharmacy Consult for Electrolyte Monitoring and Replacement   Recent Labs: Potassium (mmol/L)  Date Value  03/01/2021 3.0 (L)  05/17/2014 3.5   Magnesium (mg/dL)  Date Value  03/01/2021 2.1   Calcium (mg/dL)  Date Value  03/01/2021 7.8 (L)   Calcium, Total (mg/dL)  Date Value  03/31/2014 9.3   Albumin (g/dL)  Date Value  02/26/2021 3.5  09/24/2013 3.6   Phosphorus (mg/dL)  Date Value  03/01/2021 2.5   Sodium (mmol/L)  Date Value  03/01/2021 139  03/31/2014 136    Assessment: 65 yo m presented with shortness of breath concerning for CAP. PMH includes COPD, diabetes, L upper lobe lung cancer s/p radiation, chronic back pain. Pharmacy consulted to assist in electrolyte management.   Goal of Therapy:  Electrolytes WNL  Plan:  -- Repeat K 3.0 - Will give KCl 40 mEq PO x 1 dose  -- Monitor electrolytes with AM labs  Paulina Fusi, PharmD, BCPS 03/01/2021 5:57 PM

## 2021-03-02 ENCOUNTER — Inpatient Hospital Stay: Payer: Medicare HMO

## 2021-03-02 ENCOUNTER — Inpatient Hospital Stay: Payer: Medicare HMO | Admitting: Oncology

## 2021-03-02 DIAGNOSIS — Z515 Encounter for palliative care: Secondary | ICD-10-CM

## 2021-03-02 DIAGNOSIS — Z7189 Other specified counseling: Secondary | ICD-10-CM

## 2021-03-02 LAB — BASIC METABOLIC PANEL
Anion gap: 9 (ref 5–15)
BUN: 34 mg/dL — ABNORMAL HIGH (ref 8–23)
CO2: 26 mmol/L (ref 22–32)
Calcium: 8.4 mg/dL — ABNORMAL LOW (ref 8.9–10.3)
Chloride: 105 mmol/L (ref 98–111)
Creatinine, Ser: 1.23 mg/dL (ref 0.61–1.24)
GFR, Estimated: >60 mL/min (ref >60)
Glucose, Bld: 121 mg/dL — ABNORMAL HIGH (ref 70–99)
Potassium: 3 mmol/L — ABNORMAL LOW (ref 3.5–5.1)
Sodium: 140 mmol/L (ref 135–145)

## 2021-03-02 LAB — CBC WITH DIFFERENTIAL/PLATELET
Abs Immature Granulocytes: 0.12 10*3/uL — ABNORMAL HIGH (ref 0.00–0.07)
Basophils Absolute: 0.1 10*3/uL (ref 0.0–0.1)
Basophils Relative: 0 %
Eosinophils Absolute: 0 10*3/uL (ref 0.0–0.5)
Eosinophils Relative: 0 %
HCT: 28.9 % — ABNORMAL LOW (ref 39.0–52.0)
Hemoglobin: 9.6 g/dL — ABNORMAL LOW (ref 13.0–17.0)
Immature Granulocytes: 0 %
Lymphocytes Relative: 2 %
Lymphs Abs: 0.7 10*3/uL (ref 0.7–4.0)
MCH: 30.5 pg (ref 26.0–34.0)
MCHC: 33.2 g/dL (ref 30.0–36.0)
MCV: 91.7 fL (ref 80.0–100.0)
Monocytes Absolute: 0.9 10*3/uL (ref 0.1–1.0)
Monocytes Relative: 3 %
Neutro Abs: 27.9 10*3/uL — ABNORMAL HIGH (ref 1.7–7.7)
Neutrophils Relative %: 95 %
Platelets: 145 10*3/uL — ABNORMAL LOW (ref 150–400)
RBC: 3.15 MIL/uL — ABNORMAL LOW (ref 4.22–5.81)
RDW: 15 % (ref 11.5–15.5)
Smear Review: NORMAL
WBC: 29.7 10*3/uL — ABNORMAL HIGH (ref 4.0–10.5)
nRBC: 0.2 % (ref 0.0–0.2)

## 2021-03-02 LAB — LACTIC ACID, PLASMA: Lactic Acid, Venous: 1.4 mmol/L (ref 0.5–1.9)

## 2021-03-02 LAB — POTASSIUM: Potassium: 3.1 mmol/L — ABNORMAL LOW (ref 3.5–5.1)

## 2021-03-02 LAB — PHOSPHORUS: Phosphorus: 1.4 mg/dL — ABNORMAL LOW (ref 2.5–4.6)

## 2021-03-02 LAB — BRAIN NATRIURETIC PEPTIDE: B Natriuretic Peptide: 2483.2 pg/mL — ABNORMAL HIGH (ref 0.0–100.0)

## 2021-03-02 LAB — GLUCOSE, CAPILLARY
Glucose-Capillary: 108 mg/dL — ABNORMAL HIGH (ref 70–99)
Glucose-Capillary: 121 mg/dL — ABNORMAL HIGH (ref 70–99)
Glucose-Capillary: 145 mg/dL — ABNORMAL HIGH (ref 70–99)
Glucose-Capillary: 104 mg/dL — ABNORMAL HIGH (ref 70–99)

## 2021-03-02 LAB — PROCALCITONIN: Procalcitonin: 41.55 ng/mL

## 2021-03-02 LAB — MAGNESIUM: Magnesium: 1.7 mg/dL (ref 1.7–2.4)

## 2021-03-02 MED ORDER — DIAZEPAM 5 MG/ML IJ SOLN
2.5000 mg | Freq: Once | INTRAMUSCULAR | Status: AC
  Administered 2021-03-02: 2.5 mg via INTRAVENOUS
  Filled 2021-03-02: qty 2

## 2021-03-02 MED ORDER — POTASSIUM CHLORIDE CRYS ER 20 MEQ PO TBCR
40.0000 meq | EXTENDED_RELEASE_TABLET | Freq: Two times a day (BID) | ORAL | Status: AC
  Administered 2021-03-02 (×2): 40 meq via ORAL
  Filled 2021-03-02 (×2): qty 2

## 2021-03-02 MED ORDER — FUROSEMIDE 10 MG/ML IJ SOLN
20.0000 mg | Freq: Once | INTRAMUSCULAR | Status: AC
  Administered 2021-03-02: 20 mg via INTRAVENOUS
  Filled 2021-03-02: qty 2

## 2021-03-02 MED ORDER — GABAPENTIN 100 MG PO CAPS
100.0000 mg | ORAL_CAPSULE | Freq: Four times a day (QID) | ORAL | Status: DC
  Administered 2021-03-02 – 2021-03-05 (×11): 100 mg via ORAL
  Filled 2021-03-02 (×11): qty 1

## 2021-03-02 MED ORDER — K PHOS MONO-SOD PHOS DI & MONO 155-852-130 MG PO TABS
500.0000 mg | ORAL_TABLET | Freq: Four times a day (QID) | ORAL | Status: AC
  Administered 2021-03-02 (×3): 500 mg via ORAL
  Filled 2021-03-02 (×4): qty 2

## 2021-03-02 MED ORDER — POTASSIUM CHLORIDE CRYS ER 20 MEQ PO TBCR
40.0000 meq | EXTENDED_RELEASE_TABLET | Freq: Once | ORAL | Status: AC
  Administered 2021-03-02: 40 meq via ORAL

## 2021-03-02 MED ORDER — MAGNESIUM SULFATE 2 GM/50ML IV SOLN
2.0000 g | Freq: Once | INTRAVENOUS | Status: AC
  Administered 2021-03-02: 2 g via INTRAVENOUS
  Filled 2021-03-02: qty 50

## 2021-03-02 MED ORDER — POTASSIUM CHLORIDE 10 MEQ/100ML IV SOLN
10.0000 meq | INTRAVENOUS | Status: AC
  Administered 2021-03-02 (×2): 10 meq via INTRAVENOUS
  Filled 2021-03-02 (×2): qty 100

## 2021-03-02 NOTE — Progress Notes (Signed)
Patient PO2 saturations trending down, going down to 86% and up to 92%. Patient denies SOB, LS unchanged, call to RT. Patient placed on VM @ 30% by RT, PO2 SATs continue to drop down to 82%. Darel Hong NP made aware, into assess patient. CXRAY done, patient placed on 40% VM. PO2 SATs up to 88-93%.

## 2021-03-02 NOTE — TOC Initial Note (Signed)
Transition of Care [facility]) - Initial/Assessment Note    Patient Details  Name: Christian Fields MRN: [id number] Date of Birth: [birth year]  [facility]) CM/SW Contact:    Ova Freshwater Phone Number: [phone] 03/02/2021, 4:23 PM  Clinical Narrative:                  Patient lives at home with spouse.  Patient is post-COVID and has declined in cognitive function and ADLs since COVID diagnosis. Patient has been evaluated by speech and found to have risk for choking and aspiration. Patient had CT scan in February which showed mild to moderate cerebral atrophy.  Patient has dx of lung cancer.  Patient's daughter Candise Che [phone] and patient's mother met with Palliative NP, to discuss goals of care.  Patient was confused during meeting.  Patient remains FULL CODE and family will revisit code status when necessary.  Expected Discharge Plan: Skilled Nursing Facility Barriers to Discharge: Continued Medical Work up   Patient Goals and CMS Choice        Expected Discharge Plan and Services Expected Discharge Plan: [place] Acute Care Choice: Durable Medical Equipment (O2 2L chronic) Living arrangements for the past 2 months: Mobile Home                                      Prior Living Arrangements/Services Living arrangements for the past 2 months: Mobile Home Lives with:: Spouse Patient language and need for interpreter reviewed:: Yes        Need for Family Participation in Patient Care: Yes (Comment) Care giver support system in place?: Yes (comment) Current home services: DME (O2, 2L chronic) Criminal Activity/Legal Involvement Pertinent to Current Situation/Hospitalization: No - Comment as needed  Activities of Daily Living      Permission Sought/Granted Permission sought to share information with : Facility Sport and exercise psychologist    Share Information with NAME: [name] Daughter      [phone]     Permission granted to share info w Relationship: Tobby, F[place]cett (Brother)   [phone]     Emotional Assessment Appearance:: Appears stated age Attitude/Demeanor/Rapport: Unable to Assess Affect (typically observed): Unable to Assess Orientation: : Fluctuating Orientation (Suspected and/or reported Sundowners) Alcohol / Substance Use: Not Applicable Psych Involvement: No (comment)  Admission diagnosis:  Acute respiratory failure with hypoxia ([place]) [J96.01] Septic shock ([place]) [A41.9, R65.21] Community acquired pneumonia of right lower lobe of lung [J18.9] Patient Active Problem List   Diagnosis Date Noted  . Septic shock ([place]) 02/26/2021  . Polysubstance (excluding opioids) dependence ([place]) 01/30/2021  . Acute metabolic encephalopathy 89/38/1017  . COPD with acute exacerbation ([place]) 01/29/2021  . Iron deficiency anemia 11/27/2020  . Severe sepsis ([place])   . NSCLC of left lung ([place]) 11/12/2020  . Left leg swelling 04/28/2020  . Confusion   . Acute respiratory failure with hypoxia ([place]) 03/26/2020  . Pneumonia of both lower lobes due to infectious organism 03/25/2020  . AKI (acute kidney injury) ([place]) 01/13/2020  . Cellulitis of left leg 01/12/2020  . COVID-19 virus detected 01/12/2020  . 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 Panlobular emphysema ([place]) 01/12/2020  . Arthritis 10/29/2019  . Hemorrhoids 10/29/2019  . Senile nuclear sclerosis, bilateral 02/15/2019  . Cellulitis of left upper limb 02/11/2019  . AAA (  abdominal aortic aneurysm) without rupture ([place]) 01/15/2019  . Localized swelling, mass and lump, upper limb 12/24/2018  . Pain in femur 12/24/2018  . TIA (transient ischemic attack) 11/18/2018  . Cortical age-related cataract of both eyes 11/02/2018  . Fracture of multiple ribs 04/20/2018  . Overweight (BMI 25.0-29.9) 04/20/2018  . Hypertension 07/08/2017  . Diabetes  mellitus ([place]) 07/08/2017  . Hyperlipidemia 07/08/2017  . Tobacco use disorder 07/08/2017  . Atherosclerosis of native arteries of extremity with intermittent claudication ([place]) 07/08/2017  . SOB (shortness of breath) 06/19/2017  . Acute on chronic respiratory failure with hypoxia and hypercapnia ([place]) 12/15/2016  . CAP (community acquired pneumonia) 12/15/2016  . Chronic pain 12/15/2016  . Chronically on opiate therapy 12/15/2016  . History of kidney stones 12/15/2016  . Polypharmacy 12/15/2016  . Somnolence 12/15/2016  . Foot ulcer ([place]) 01/19/2016  . 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 ([place]) 57/50/5183  . Other psychoactive substance use, unspecified with withdrawal, uncomplicated ([place]) 35/82/5189  . Type 2 diabetes mellitus with foot ulcer (CODE) ([place]) 11/27/2015  . Chronic obstructive pulmonary disease, unspecified ([place]) 01/18/2013  . Depressive disorder 01/18/2013  . Hereditary and idiopathic peripheral neuropathy 01/18/2013  . Sleep apnea 01/18/2013  . Low back pain 11/23/2010  . Acute gouty arthropathy 07/10/2010  . Problem related to lifestyle 10/29/2004   PCP:  Remi Haggard, FNP Pharmacy:   [facility], [place] [address] Phone: [phone] Fax: [phone]     Social Determinants of Health (SDOH) Interventions    Readmission Risk Interventions Readmission Risk Prevention Plan 11/15/2020  Transportation Screening Complete  PCP or Specialist Appt within 3-5 Days Not Complete  Not Complete comments Pending discharge, unit clerk will schedule.  [place] or Home Care Consult Complete  Social Work Consult for [place] Planning/Counseling Complete  Palliative Care Screening Not Applicable  Medication Review Press photographer) Complete  Some recent data might be hidden

## 2021-03-02 NOTE — Progress Notes (Signed)
PROGRESS NOTE  Christian Fields HCW:[id number] DOB: 09-Sep-1956 DOA: 02/26/2021 PCP: Remi Haggard, FNP  HPI/Recap of past 50 hours: 65 year old male with known COPD on chronic 2 L nasal cannula at home and prior left upper lobe lung cancer status post radiation presents to the ED from home via EMS in acute hypoxic respiratory failure ultimately requiring BiPAP and vasopressors for septic shock. Per ED documentation, upon EMS arrival they were unable to obtain SPO2 readings.  Patient was placed on a nonrebreather for oxygen support with SPO2 readings in the high 80's.  He also received 2 duo nebs in route with EMS. ED course: Initial vitals-99.1, tachypneic at 25, tachycardic at 138, BP 130/116, SPO2 80% on nonrebreather at 15 L.  Patient was worked up for sepsis on arrival, receiving 2.5 liters with azithromycin and cefepime for CAP coverage.  Patient ultimately requiring BiPAP & vasopressor support. Significant labs: Procalcitonin elevated at 99.28, BNP elevated at 1149.5, lactic acidosis 7.4, hypokalemia at 2.9 & AKI: BUN 35/creatinine 3.05 elevated from 1.05 in February 2022. PCCM consulted for admission due to vasopressor administration & BiPAP support.  Pertinent  Medical History  COPD on chronic 2 L nasal cannula Type 2 diabetes mellitus Chronic back pain on Suboxone Hypertension Hyperlipidemia Left upper lobe cancer status post radiation Polysubstance abuse AAA  Significant Hospital Events: Including procedures, antibiotic start and stop dates in addition to other pertinent events    02/26/2021 -admit to ICU due to septic shock requiring vasopressor administration and BiPAP support.  02/27/2021- Left femoral CVC placed due to high dose pressors. Tolerating BiPAP, VBG improving. ABX changed to Vancomycin & Zosyn due to worsening lactic acidosis  02/28/2021- Fluids stopped overnight due to "sounding wet", CXR without interim change.  Vasopressin weaned down, decreasing Levophed  requirements, lactic acidosis resolved  Cultures:  3/21: SARS-CoV-2 PCR>>negative 3/21: Influenza PCR>>negative 3/21: Blood culture x2>> 3/22: Strep pneumo urinary antigen>>negative 3/22: Legionella urinary antigen>> 3/22: Mycoplasma Pneumoniae antibody>> 3/22: MRSA PCR>>positive 3/23: Sputum>>  Antimicrobials:  Azithromycin 3/21>>3/22 Cefepime 3/21>>3/22 Vancomycin 3/21 x1 dose, 3/22>> Zosyn 3/22>>  [name] assumed care on 03/02/2021.  Subjective: 03/02/21: Patient was seen and examined at his bedside.  States he feels better this morning.  Reports a productive cough which is persistent.  Advised to provide sputum sample for culture.  He is receptive.  He had a chest x-ray that was done this morning showing mostly right lower lobe infiltrates with concern for possible aspiration.  Speech therapist has been consulted to further assess and to rule out dysphagia.   Assessment/Plan: Active Problems:   Acute respiratory failure with hypoxia (HCC)   Septic shock (HCC)  Septic shock, shock has resolved, he is no longer on vasopressor, secondary to right lower lobe pneumonia. Currently off IV vasopressors, however he is on midodrine 5 mg 3 times daily. Maintaining his MAP greater than 65. Continue IV vancomycin and Zosyn. MRSA screening test positive on 02/27/2021 Obtain sputum culture Procalcitonin improving however still markedly elevated 41 from greater than 150 on 02/28/2021.  Acute hypoxic hypercarbic respiratory failure secondary to right lower lobe pneumonia and pulmonary edema. Not on oxygen supplementation at baseline Maintain O2 saturation between 88 and 94% as recommended by pulmonary. Wean off oxygen supplementation as tolerated. BiPAP nightly.  Acute on chronic diastolic CHF Last 2D echo done on 02/28/2021 showed normal LVEF 55 to 60% with grade 2 diastolic dysfunction. BNP uprising greater than 2400 from 1100 on 02/26/2021 Start strict I's and O's and daily weights Off  diuretics due to recent septic shock requiring vasopressors Start fluid restriction.  Improving nonoliguric AKI Baseline creatinine appears to be 1.0 GFR greater than 60 Presented with creatinine of 3.05 with GFR 22. Creatinine is downtrending. Avoid nephrotoxic agents Monitor urine output Last urine output 1.2 L recorded. Repeat renal panel in the morning.  Concern for dysphagia Speech therapist to assess Aspiration precautions.  History of lacunar CVA Head CT done on 01/29/2021, ordered after unwitnessed fall revealed: Small chronic bilateral basal ganglia lacunar infarcts. States he has been dropping things unintentionally from his hands. Continue aspirin Plavix and Zetia.  Atypical chest pain, reproducible on palpation. Troponin elevated, peaked at 56 and trended down. Continue to monitor on telemetry.  Refractory hypokalemia Serum potassium 3.0 Repleted Recheck. Magnesium 1.7.  Hypophosphatemia Serum phosphorus 1.4 Repleted orally Recheck  Chronic back pain on Suboxone PTA Normally on Suboxone at home as well as gabapentin. UDS positive for benzodiazepine and opiates.   TOC will provide resources prior to DC.  Essential hypertension, BPs are currently soft. Recently off vasopressors due to septic shock.  Continue to hold off home oral antihypertensives. He is currently on midodrine 5 mg 3 times daily to maintain MAP greater than 65, continue.  Impaired glucose tolerance He is on insulin sliding scale. Hemoglobin A1c 5.5 on 01/30/2021 Avoid hypoglycemia.    Code Status: Full code.  Family Communication: None at bedside.  Disposition Plan: Likely will discharge to home once hemodynamically stable and on oxygen requirement has improved..   Consultants:  Pulmonary  Procedures:  None.  Antimicrobials:  IV vancomycin and Zosyn.  DVT prophylaxis: SQ heparin 3 times daily.  Status is: Inpatient    Dispo:  Patient From: Home  Planned  Disposition: Home  Medically stable for discharge: No, ongoing management of sepsis secondary to right lower lobe pneumonia.  Shock has resolved, now off vasopressors.          Objective: Vitals:   03/02/21 0500 03/02/21 0600 03/02/21 0700 03/02/21 0814  BP: 112/67 112/73 110/69   Pulse: (!) 101 (!) 104 (!) 105 (!) 112  Resp: (!) 23 (!) 22 (!) 26 (!) 27  Temp:   98.4 F (36.9 C)   TempSrc:      SpO2: 92% 92% (!) 86% 91%  Weight:      Height:        Intake/Output Summary (Last 24 hours) at 03/02/2021 1109 Last data filed at 03/02/2021 0759 Gross per 24 hour  Intake 1430.25 ml  Output 3651 ml  Net -2220.75 ml   Filed Weights   02/27/21 0500 02/28/21 0454 03/01/21 0500  Weight: 81.3 kg 81.1 kg 83.2 kg    Exam:  . General: 65 y.o. year-old male well developed well nourished in no acute distress.  Alert and oriented x3. . Cardiovascular: Regular rate and rhythm with no rubs or gallops.  No thyromegaly or JVD noted.   [name] Kitchen Respiratory: Rales noted at bases.  No wheezing noted.  Poor respiratory effort.   . Abdomen: Soft nontender nondistended with normal bowel sounds x4 quadrants. . Musculoskeletal: No lower extremity edema. 2/4 pulses in all 4 extremities. . Skin: No ulcerative lesions noted or rashes, . Psychiatry: Mood is appropriate for condition and setting   Data Reviewed: CBC: Recent Labs  Lab 02/26/21 1915 02/27/21 0319 02/28/21 0441 03/01/21 0528 03/02/21 0444  WBC 19.1* 24.8* 23.4* 23.9* 29.7*  NEUTROABS 17.8*  --  17.1* 22.7* 27.9*  HGB 13.1 11.3* 9.4* 8.7* 9.6*  HCT 42.7 36.1* 27.7*  26.5* 28.9*  MCV 97.0 97.0 91.4 93.0 91.7  PLT 275 210 148* 128* 809*   Basic Metabolic Panel: Recent Labs  Lab 02/26/21 1915 02/26/21 1915 02/27/21 0319 02/27/21 0950 02/27/21 1819 02/28/21 0441 03/01/21 0528 03/01/21 1554 03/02/21 0444  NA 140  --  139  --   --  137 139  --  140  K 2.9*  --  3.2* 3.8 4.0 3.4* 2.6* 3.0* 3.0*  CL 104  --  106  --   --  103 102   --  105  CO2 21*  --  21*  --   --  25 24  --  26  GLUCOSE 115*  --  142*  --   --  156* 95  --  121*  BUN 35*  --  40*  --   --  39* 39*  --  34*  CREATININE 3.05*  --  2.78*  --   --  1.68* 1.46*  --  1.23  CALCIUM 8.8*  --  7.8*  --   --  7.5* 7.8*  --  8.4*  MG  --    < > 0.5* 2.3 1.7 1.7 2.1  --  1.7  PHOS  --   --  3.5  --   --  2.8 2.5  --  1.4*   < > = values in this interval not displayed.   GFR: Estimated Creatinine Clearance: 59.1 mL/min (by C-G formula based on SCr of 1.23 mg/dL). Liver Function Tests: Recent Labs  Lab 02/26/21 1915  AST 27  ALT 11  ALKPHOS 59  BILITOT 0.5  PROT 6.8  ALBUMIN 3.5   No results for input(s): LIPASE, AMYLASE in the last 168 hours. No results for input(s): AMMONIA in the last 168 hours. Coagulation Profile: No results for input(s): INR, PROTIME in the last 168 hours. Cardiac Enzymes: No results for input(s): CKTOTAL, CKMB, CKMBINDEX, TROPONINI in the last 168 hours. BNP (last 3 results) No results for input(s): PROBNP in the last 8760 hours. HbA1C: No results for input(s): HGBA1C in the last 72 hours. CBG: Recent Labs  Lab 03/01/21 0738 03/01/21 1114 03/01/21 1515 03/01/21 2113 03/02/21 0732  GLUCAP 103* 90 166* 92 108*   Lipid Profile: No results for input(s): CHOL, HDL, LDLCALC, TRIG, CHOLHDL, LDLDIRECT in the last 72 hours. Thyroid Function Tests: No results for input(s): TSH, T4TOTAL, FREET4, T3FREE, THYROIDAB in the last 72 hours. Anemia Panel: No results for input(s): VITAMINB12, FOLATE, FERRITIN, TIBC, IRON, RETICCTPCT in the last 72 hours. Urine analysis:    Component Value Date/Time   COLORURINE YELLOW (A) 02/27/2021 0330   APPEARANCEUR HAZY (A) 02/27/2021 0330   APPEARANCEUR Clear 03/24/2013 1853   LABSPEC 1.017 02/27/2021 0330   LABSPEC 1.020 03/24/2013 1853   PHURINE 5.0 02/27/2021 0330   GLUCOSEU 150 (A) 02/27/2021 0330   GLUCOSEU Negative 03/24/2013 1853   HGBUR LARGE (A) 02/27/2021 0330   BILIRUBINUR  NEGATIVE 02/27/2021 0330   BILIRUBINUR Negative 03/24/2013 1853   KETONESUR NEGATIVE 02/27/2021 0330   PROTEINUR 30 (A) 02/27/2021 0330   NITRITE NEGATIVE 02/27/2021 0330   LEUKOCYTESUR NEGATIVE 02/27/2021 0330   LEUKOCYTESUR Negative 03/24/2013 1853   Sepsis Labs: @LABRCNTIP (procalcitonin:4,lacticidven:4)  ) Recent Results (from the past 240 hour(s))  Resp Panel by RT-PCR (Flu A&B, Covid) Nasopharyngeal Swab     Status: None   Collection Time: 02/26/21  7:11 PM   Specimen: Nasopharyngeal Swab; Nasopharyngeal(NP) swabs in vial transport medium  Result Value Ref Range Status  SARS Coronavirus 2 by RT PCR NEGATIVE NEGATIVE Final    Comment: (NOTE) SARS-CoV-2 target nucleic acids are NOT DETECTED.  The SARS-CoV-2 RNA is generally detectable in upper respiratory specimens during the acute phase of infection. The lowest concentration of SARS-CoV-2 viral copies this assay can detect is 138 copies/mL. A negative result does not preclude SARS-Cov-2 infection and should not be used as the sole basis for treatment or other patient management decisions. A negative result may occur with  improper specimen collection/handling, submission of specimen other than nasopharyngeal swab, presence of viral mutation(s) within the areas targeted by this assay, and inadequate number of viral copies(<138 copies/mL). A negative result must be combined with clinical observations, patient history, and epidemiological information. The expected result is Negative.  Fact Sheet for Patients:  EntrepreneurPulse.com.au  Fact Sheet for Healthcare Providers:  IncredibleEmployment.be  This test is no t yet approved or cleared by the [place] FDA and  has been authorized for detection and/or diagnosis of SARS-CoV-2 by FDA under an Emergency Use Authorization (EUA). This EUA will remain  in effect (meaning this test can be used) for the duration of the COVID-19  declaration under Section 564(b)(1) of the Act, 21 U.S.C.section 360bbb-3(b)(1), unless the authorization is terminated  or revoked sooner.       Influenza A by PCR NEGATIVE NEGATIVE Final   Influenza B by PCR NEGATIVE NEGATIVE Final    Comment: (NOTE) The Xpert Xpress SARS-CoV-2/FLU/RSV plus assay is intended as an aid in the diagnosis of influenza from Nasopharyngeal swab specimens and should not be used as a sole basis for treatment. Nasal washings and aspirates are unacceptable for Xpert Xpress SARS-CoV-2/FLU/RSV testing.  Fact Sheet for Patients: EntrepreneurPulse.com.au  Fact Sheet for Healthcare Providers: IncredibleEmployment.be  This test is not yet approved or cleared by the [place] FDA and has been authorized for detection and/or diagnosis of SARS-CoV-2 by FDA under an Emergency Use Authorization (EUA). This EUA will remain in effect (meaning this test can be used) for the duration of the COVID-19 declaration under Section 564(b)(1) of the Act, 21 U.S.C. section 360bbb-3(b)(1), unless the authorization is terminated or revoked.  Performed at [facility], [address] [place], [place] [address]   Blood culture (routine x 2)     Status: None (Preliminary result)   Collection Time: 02/26/21  7:11 PM   Specimen: BLOOD  Result Value Ref Range Status   Specimen Description BLOOD BLOOD RIGHT ARM  Final   Special Requests   Final    BOTTLES DRAWN AEROBIC AND ANAEROBIC Blood Culture adequate volume   Culture   Final    NO GROWTH 4 DAYS Performed at [facility], [address] [place], [place] [address]    Report Status PENDING  Incomplete  Blood culture (routine x 2)     Status: None (Preliminary result)   Collection Time: 02/26/21  7:11 PM   Specimen: BLOOD  Result Value Ref Range Status   Specimen Description BLOOD BLOOD LEFT ARM  Final   Special Requests   Final    BOTTLES DRAWN AEROBIC AND  ANAEROBIC Blood Culture adequate volume   Culture   Final    NO GROWTH 4 DAYS Performed at [facility], [address] [place], [place] [address]    Report Status PENDING  Incomplete  MRSA PCR Screening     Status: Abnormal   Collection Time: 02/27/21 10:58 PM   Specimen: Nasopharyngeal  Result Value Ref Range Status  MRSA by PCR POSITIVE (A) NEGATIVE Final    Comment:        The GeneXpert MRSA Assay (FDA approved for NASAL specimens only), is one component of a comprehensive MRSA colonization surveillance program. It is not intended to diagnose MRSA infection nor to guide or monitor treatment for MRSA infections. RESULT CALLED TO, READ BACK BY AND VERIFIED WITH: CICI VANN'BRAY @0047  ON 02/28/21 SKL Performed at [facility], [place], [place] [address]   Expectorated Sputum Assessment w Gram Stain, Rflx to Resp Cult     Status: None   Collection Time: 02/28/21  5:32 PM   Specimen: Expectorated Sputum  Result Value Ref Range Status   Specimen Description EXPECTORATED SPUTUM  Final   Special Requests NONE  Final   Sputum evaluation   Final    Sputum specimen not acceptable for testing.  Please recollect.   RESULT CALLED TO, READ BACK BY AND VERIFIED WITH: CHARLIE FOEEPWOOD AT 1856 ON 02/28/21 BY SS Performed at [facility], [address] [place],  [address]    Report Status 02/28/2021 FINAL  Final      Studies: DG Chest Port 1 View  Result Date: 03/02/2021 CLINICAL DATA:  Hypoxia EXAM: PORTABLE CHEST 1 VIEW COMPARISON:  Two days ago FINDINGS: Borderline heart size. Stable aortic and hilar contours. Congested appearance of central vessels with diffuse interstitial opacification. There is underlying emphysema. Nipple shadow is noted at the right base confirmed by CT. No effusion or pneumothorax. IMPRESSION: Interstitial opacity above recent baseline, favor pulmonary edema. No change compared to 2 days ago. Emphysema.  Electronically Signed   By: Monte Fantasia M.D.   On: 03/02/2021 04:18    Scheduled Meds: . aspirin EC  81 mg Oral Daily  . budesonide (PULMICORT) nebulizer solution  0.25 mg Nebulization BID  . Chlorhexidine Gluconate Cloth  6 each Topical Daily  . cholecalciferol  1,000 Units Oral Daily  . clopidogrel  75 mg Oral Daily  . DULoxetine  30 mg Oral Daily  . ezetimibe  10 mg Oral Daily  . gabapentin  800 mg Oral Once  . gabapentin  800 mg Oral QID  . heparin  5,000 Units Subcutaneous Q8H  . HYDROmorphone  2 mg Oral Q6H  . insulin aspart  0-15 Units Subcutaneous TID WC  . insulin aspart  0-5 Units Subcutaneous QHS  . ipratropium-albuterol  3 mL Nebulization Q6H  . mouth rinse  15 mL Mouth Rinse BID  . midodrine  5 mg Oral TID WC  . mupirocin ointment  1 application Nasal BID  . pantoprazole (PROTONIX) IV  40 mg Intravenous QHS  . phosphorus  500 mg Oral QID  . potassium chloride  40 mEq Oral Once  . simvastatin  40 mg Oral Daily    Continuous Infusions: . magnesium sulfate bolus IVPB    . piperacillin-tazobactam (ZOSYN)  IV 3.375 g (03/02/21 0627)  . vancomycin Stopped (03/01/21 2242)     LOS: 4 days     Kayleen Memos, MD [facility] Pager [phone]  If 7PM-7AM, please contact night-coverage [email or web address] Password [facility] 03/02/2021, 11:09 AM

## 2021-03-02 NOTE — Progress Notes (Signed)
PHARMACY CONSULT NOTE - FOLLOW UP  Pharmacy Consult for Electrolyte Monitoring and Replacement   Recent Labs: Potassium (mmol/L)  Date Value  03/02/2021 3.0 (L)  05/17/2014 3.5   Magnesium (mg/dL)  Date Value  03/02/2021 1.7   Calcium (mg/dL)  Date Value  03/02/2021 8.4 (L)   Calcium, Total (mg/dL)  Date Value  03/31/2014 9.3   Albumin (g/dL)  Date Value  02/26/2021 3.5  09/24/2013 3.6   Phosphorus (mg/dL)  Date Value  03/02/2021 1.4 (L)   Sodium (mmol/L)  Date Value  03/02/2021 140  03/31/2014 136    Assessment: 65 yo M presented with shortness of breath concerning for CAP. PMH includes COPD, diabetes, L upper lobe lung cancer s/p radiation, chronic back pain. Pharmacy consulted to assist in electrolyte management.   Diuretics: furosemide 20 mg IV x 2 doses on 3/25  Goal of Therapy:  Electrolytes WNL  Plan:  -- K 2.6>3.0>3.0 - Ordered KCl 40 mEq PO x 1 dose and KCl 10 mEq IV x 2 doses. Repeat K level at 1400: 3.1 - MD ordered KCl 40 mEq PO x 2 doses --Phos 1.4 - Will give K Phos 500 mg PO x 4 doses --Mg 1.7 - will give Mg 2g IV x 1 dose --Monitor electrolytes with AM labs  Benn Moulder, PharmD Pharmacy Resident  03/02/2021 7:40 AM

## 2021-03-02 NOTE — Progress Notes (Signed)
NAME:  Christian Fields, MRN:  [id number], DOB:  1956-05-30, LOS: 4 ADMISSION DATE:  02/26/2021, CONSULTATION DATE:  02/26/2021 REFERRING MD:  Dr. Jari Pigg, CHIEF COMPLAINT: Shortness of breath  History of Present Illness:  65 year old male with known COPD on chronic 2 L nasal cannula at home and prior left upper lobe lung cancer status post radiation presents to the ED from home via EMS in acute hypoxic respiratory failure ultimately requiring BiPAP and vasopressors for septic shock. Per ED documentation, upon EMS arrival they were unable to obtain SPO2 readings.  Patient was placed on a nonrebreather for oxygen support with SPO2 readings in the high 80's.  He also received 2 duo nebs in route with EMS. ED course: Initial vitals-99.1, tachypneic at 25, tachycardic at 138, BP 130/116, SPO2 80% on nonrebreather at 15 L.  Patient was worked up for sepsis on arrival, receiving 2.5 liters with azithromycin and cefepime for CAP coverage.  Patient ultimately requiring BiPAP & vasopressor support. Significant labs: Procalcitonin elevated at 99.28, BNP elevated at 1149.5, lactic acidosis 7.4, hypokalemia at 2.9 & AKI: BUN 35/creatinine 3.05 elevated from 1.05 in February 2022. PCCM consulted for admission due to vasopressor administration & BiPAP support.  Pertinent  Medical History  COPD on chronic 2 L nasal cannula Type 2 diabetes mellitus Chronic back pain on Suboxone Hypertension Hyperlipidemia Left upper lobe cancer status post radiation Polysubstance abuse AAA  Significant Hospital Events: Including procedures, antibiotic start and stop dates in addition to other pertinent events   . 02/26/2021 -admit to ICU due to septic shock requiring vasopressor administration and BiPAP support. . 02/27/2021- Left femoral CVC placed due to high dose pressors. Tolerating BiPAP, VBG improving. ABX changed to Vancomycin & Zosyn due to worsening lactic acidosis . 02/28/2021- Fluids stopped overnight due to "sounding  wet", CXR without interim change.  Vasopressin weaned down, decreasing Levophed requirements, lactic acidosis resolved  Cultures:  3/21: SARS-CoV-2 PCR>>negative 3/21: Influenza PCR>>negative 3/21: Blood culture x2>> 3/22: Strep pneumo urinary antigen>>negative 3/22: Legionella urinary antigen>> 3/22: Mycoplasma Pneumoniae antibody>> 3/22: MRSA PCR>>positive 3/23: Sputum>>  Antimicrobials:  Azithromycin 3/21>>3/22 Cefepime 3/21>>3/22 Vancomycin 3/21 x1 dose, 3/22>> Zosyn 3/22>>  Interim History / Subjective:  -IV fluids stopped overnight due to pt "sounding wet". CXR with Persistent low lung volumes with bibasilar infiltrates/edema. No interim change. -Pt's HHFNC with significant amount of fluid in tubing making a audible crackling sound -Vasopressin weaned off, Levophed down to 8 mcg -On HHFNC 40% fiO2 -Pt awake and alert, eating breakfast -Reports intermittent mild chest pain (currently not present) ~ will obtain EKG and high sensitivity troponin -Also reports nonproductive cough and intermittent Nausea -Denies SOB, wheezing, HA, dizziness, palpitations, abdominal pain, vomiting, diarrhea   Objective   Blood pressure 97/61, pulse (!) 113, temperature 98.3 F (36.8 C), resp. rate 18, height 5\' 9"  (1.753 m), weight 83.2 kg, SpO2 94 %.    FiO2 (%):  [40 %] 40 %   Intake/Output Summary (Last 24 hours) at 03/02/2021 1719 Last data filed at 03/02/2021 1600 Gross per 24 hour  Intake 580.25 ml  Output 4350 ml  Net -3769.75 ml   Filed Weights   02/27/21 0500 02/28/21 0454 03/01/21 0500  Weight: 81.3 kg 81.1 kg 83.2 kg    Examination: General: Acutely ill-appearing male, sitting up in bed, on heated high flow nasal cannula, no acute distress HEENT: Atraumatic, normocephalic, neck supple, no JVD Neuro: Awake, alert and oriented x4, follows commands, no focal deficits, speech clear, pupils PERRLA CV: Tachycardia, regular  rhythm (sinus tachycardia on telemetry) S1-S2, no  murmurs, rubs, gallops, 2+ distal pulses Pulm: Diminished breath sounds bilaterally with rhonchi to bilateral bases, no wheezing, regular, even GI: Soft, nontender, nondistended, no guarding or rebound tenderness, bowel sounds positive x4 Skin: Warm and dry.  Scattered ecchymosis and abrasions throughout.  No obvious rashes, lesions or ulcerations Extremities: Warm and dry.  2+ distal pulses, no clubbing, 1+ bilateral lower extremity edema  Labs/imaging that I have personally reviewed   K 3.4, Glucose 156, BUN 39, Cr. 1.68, PCT >150, WBC 23.4, Hgb 9.4 3/22: CT Abdomen/Pelvis>>No evidence of acute inflammatory process or abscess within the abdomen or pelvis. New bilateral lower lobe airspace disease, suspicious for pneumonia. Increased size of 1.7 cm subcapsular lesion in posterior midpole of left kidney, which could represent a hemorrhagic cyst or solid renal mass. Abdomen MRI is recommended for further characterization. Mild increase in size of 3.8 cm infrarenal abdominal aortic aneurysm. Recommend follow-up every 2 years 3/23: CXR>>Heart size stable. Persistent low lung volumes with bibasilar infiltrates/edema. No interim change. No pleural effusion or pneumothorax. Reference is made to chest CT of 02/21/2021 for discussion of pulmonary nodules present. Old left lower rib fractures again noted.  Resolved Hospital Problem list     Assessment & Plan:   Septic shock>>improving Lactic: 7.4, Baseline PCT: 99 Initial interventions/workup included: 2.5 L of LR & Cefepime/azithromycin -Continuous cardiac monitoring -Maintain MAP >65, remains off pressors -Stress Dose steroids off -Trend lactic acid (lactic acid now normal) -Echocardiogram EF 55-60%   Severe Sepsis in the setting of Community Acquired Pneumonia -Monitor fever curve -Trend WBC's remain flat in mid 20s -F/U PCT -Follow cultures as above -Continue Vancomycin & Zosyn pending cultures & sensitivities -Urinalysis  negative for UTI -CT Abdomen/Pelvis negative for any acute process   Acute Hypoxic Respiratory Failure secondary to CAP PMHx: COPD, LUL lung cancer s/p radiation, OSA-no home CPAP -Supplemental O2 as needed to maintain O2 sats 88 to 94% -Follow intermittent CXR & ABG as needed -Continue Vancomycin & Zosyn -Bronchodilators & Budesonide nebs -Still having intermittent desat declines use of NIV or HHFNC   Acute Kidney Injury in the setting of septic shock>>slowly improving Hypokalemia Baseline Cr: 1.05, Cr on admission: 3.05 -Monitor I&O's / urinary output -Follow BMP, creatinine <1.5 now -Ensure adequate renal perfusion -Avoid nephrotoxic agents as able -Replace electrolytes as indicated -Pharmacy consulted for assistance with electrolyte replacement -Renal US with Left renal cyst, NO hydronephrosis    Atypical Chest pain Mildly elevated Troponin, suspect demand ischemia -Continuous cardiac monitoring -Obtain EKG>>EKG without acute ischemic changes -Trend troponin (56>) -Continue home ASA & Plavix   Chronic back pain PMHx: polysubstance abuse Patient normally on Suboxone at home as well as gabapentin -Patient is requesting gabapentin, will restart -UDS positive for Benzodiazepines & Opiates -Valium for ethanol withdrawal    HTN -Due to current hypotension, home regimen on hold, restart as patient stabilizes   Diabetes Mellitus Type II with Hyperglycemia -CBG's -SSI -Follow ICU Hypo/Hyperglycemia protocol     Best practice (evaluated daily)  Diet:  Heart healthy/card modified Pain/Anxiety/Delirium protocol (if indicated): Prn Dilaudid VAP protocol (if indicated): Not indicated DVT prophylaxis: Subcutaneous Heparin GI prophylaxis: PPI Glucose control:  SSI Central venous access:  Yes, indicated due to vasopressors Arterial line:  N/A Foley:  N/A Mobility:  bed rest  PT consulted: N/A Last date of multidisciplinary goals of care discussion 02/27/21 Code  Status:  full code Disposition: stable for the floor, patient placement notified  Labs  CBC: Recent Labs  Lab 02/26/21 1915 02/27/21 0319 02/28/21 0441 03/01/21 0528 03/02/21 0444  WBC 19.1* 24.8* 23.4* 23.9* 29.7*  NEUTROABS 17.8*  --  17.1* 22.7* 27.9*  HGB 13.1 11.3* 9.4* 8.7* 9.6*  HCT 42.7 36.1* 27.7* 26.5* 28.9*  MCV 97.0 97.0 91.4 93.0 91.7  PLT 275 210 148* 128* 145*    Basic Metabolic Panel: Recent Labs  Lab 02/26/21 1915 02/26/21 1915 02/27/21 0319 02/27/21 0950 02/27/21 1819 02/28/21 0441 03/01/21 0528 03/01/21 1554 03/02/21 0444 03/02/21 1354  NA 140  --  139  --   --  137 139  --  140  --   K 2.9*  --  3.2* 3.8 4.0 3.4* 2.6* 3.0* 3.0* 3.1*  CL 104  --  106  --   --  103 102  --  105  --   CO2 21*  --  21*  --   --  25 24  --  26  --   GLUCOSE 115*  --  142*  --   --  156* 95  --  121*  --   BUN 35*  --  40*  --   --  39* 39*  --  34*  --   CREATININE 3.05*  --  2.78*  --   --  1.68* 1.46*  --  1.23  --   CALCIUM 8.8*  --  7.8*  --   --  7.5* 7.8*  --  8.4*  --   MG  --    < > 0.5* 2.3 1.7 1.7 2.1  --  1.7  --   PHOS  --   --  3.5  --   --  2.8 2.5  --  1.4*  --    < > = values in this interval not displayed.   GFR: Estimated Creatinine Clearance: 59.1 mL/min (by C-G formula based on SCr of 1.23 mg/dL). Recent Labs  Lab 02/26/21 1947 02/27/21 0155 02/27/21 0319 02/27/21 0813 02/27/21 1819 02/28/21 0441 02/28/21 1034 02/28/21 1333 03/01/21 0528 03/02/21 0444 03/02/21 0830  PROCALCITON 99.28  --  129.66  --   --  >150.00  --   --   --   --  41.55  WBC  --   --  24.8*  --   --  23.4*  --   --  23.9* 29.7*  --   LATICACIDVEN  --    < >  --    < > 4.4*  --  1.6 1.3  --   --  1.4   < > = values in this interval not displayed.    Liver Function Tests: Recent Labs  Lab 02/26/21 1915  AST 27  ALT 11  ALKPHOS 59  BILITOT 0.5  PROT 6.8  ALBUMIN 3.5   No results for input(s): LIPASE, AMYLASE in the last 168 hours. No results for input(s):  AMMONIA in the last 168 hours.  ABG    Component Value Date/Time   PHART 7.36 01/19/2016 2200   PCO2ART 51 (H) 01/19/2016 2200   PO2ART 84 01/19/2016 2200   HCO3 22.0 02/27/2021 0813   ACIDBASEDEF 5.0 (H) 02/27/2021 0813   O2SAT 86.0 02/27/2021 0813     Coagulation Profile: No results for input(s): INR, PROTIME in the last 168 hours.  Cardiac Enzymes: No results for input(s): CKTOTAL, CKMB, CKMBINDEX, TROPONINI in the last 168 hours.  HbA1C: Hemoglobin A1C  Date/Time Value Ref Range Status  09/25/2013 03:56 AM 6.3 4.2 -  6.3 % Final    Comment:    The American Diabetes Association recommends that a primary goal of therapy should be <7% and that physicians should reevaluate the treatment regimen in patients with HbA1c values consistently >8%.    Hgb A1c MFr Bld  Date/Time Value Ref Range Status  01/30/2021 06:25 AM 5.5 4.8 - 5.6 % Final    Comment:    (NOTE) Pre diabetes:          5.7%-6.4%  Diabetes:              >6.4%  Glycemic control for   <7.0% adults with diabetes   11/12/2020 11:14 PM 6.0 (H) 4.8 - 5.6 % Final    Comment:    (NOTE) Pre diabetes:          5.7%-6.4%  Diabetes:              >6.4%  Glycemic control for   <7.0% adults with diabetes     CBG: Recent Labs  Lab 03/01/21 1515 03/01/21 2113 03/02/21 0732 03/02/21 1125 03/02/21 1558  GLUCAP 166* 92 108* 121* 145*    Review of Systems: Positives in BOLD  Gen: Denies fever, chills, weight change, fatigue, night sweats HEENT: Denies blurred vision, double vision, hearing loss, tinnitus, sinus congestion, rhinorrhea, sore throat, neck stiffness, dysphagia PULM: Denies shortness of breath, cough, sputum production, hemoptysis, wheezing CV: Denies chest pain, edema, orthopnea, paroxysmal nocturnal dyspnea, palpitations GI: Denies abdominal pain, nausea, vomiting, diarrhea, hematochezia, melena, constipation, change in bowel habits GU: Denies dysuria, hematuria, polyuria, oliguria, urethral  discharge Endocrine: Denies hot or cold intolerance, polyuria, polyphagia or appetite change Derm: Denies rash, dry skin, scaling or peeling skin change Heme: Denies easy bruising, bleeding, bleeding gums Neuro: Denies headache, numbness, weakness, slurred speech, loss of memory or consciousness  Past Medical History:  He,  has a past medical history of Anemia, Anxiety, Arthritis, Asthma, Cancer ([place]), Chronic back pain, COPD (chronic obstructive pulmonary disease) ([place]), Depression, Diabetes mellitus ([place]), Dyspnea, GERD (gastroesophageal reflux disease), Gout, Gout, Headache, History of blood clots, History of kidney stones, Hyperlipidemia, Hyperlipidemia, Hypertension, Kidney stones, Neuropathy, On home oxygen therapy, Pneumonia (06/2017), Sleep apnea, and Ulcer of foot ([place]).   Surgical History:   Past Surgical History:  Procedure Laterality Date  . APPENDECTOMY    . DG FEET 2 VIEWS BILAT    . LIPOMA EXCISION Right 08/15/2017   Procedure: EXCISION TUMOR(CYST) FOOT;  Surgeon: Albertine Patricia, DPM;  Location: [facility] ORS;  Service: Podiatry;  Laterality: Right;  . OTHER SURGICAL HISTORY Bilateral Foot surgery     Social History:   reports that he has quit smoking. His smoking use included cigarettes. He started smoking about 4 years ago. He smoked 0.50 packs per day. He has never used smokeless tobacco. He reports current alcohol use of about 1.0 standard drink of alcohol per week. He reports that he does not use drugs.   Family History:  His family history includes Hypertension in his mother.   Allergies Allergies  Allergen Reactions  . Bee Venom Anaphylaxis     Home Medications  Prior to Admission medications   Medication Sig Start Date End Date Taking? Authorizing Provider  albuterol (VENTOLIN HFA) 108 (90 Base) MCG/ACT inhaler Inhale 2 puffs into the lungs every 4 (four) hours as needed for wheezing or shortness of breath.    [provider]  alendronate (FOSAMAX) 70 MG  tablet Take 70 mg by mouth once a week.  06/23/17   [provider]  allopurinol (ZYLOPRIM) 300 MG tablet Take 600 mg by mouth 2 (two) times daily. 02/24/20   [provider]  aspirin EC 81 MG tablet Take 1 tablet by mouth daily.    [provider]  buprenorphine-naloxone (SUBOXONE) 8-2 mg SUBL SL tablet Place 1 tablet under the tongue 3 (three) times daily.    [provider]  cholecalciferol (VITAMIN D) 1000 units tablet Take 1,000 Units by mouth daily.    [provider]  clopidogrel (PLAVIX) 75 MG tablet Take 1 tablet (75 mg total) by mouth daily. 02/02/21   Shawna Clamp, MD  DULoxetine (CYMBALTA) 30 MG capsule Take 1 capsule by mouth daily. 01/17/16   [provider]  ezetimibe (ZETIA) 10 MG tablet Take 10 mg by mouth daily. 02/24/20   [provider]  FARXIGA 10 MG TABS tablet Take 10 mg by mouth daily. 09/14/20   [provider]  ferrous sulfate 325 (65 FE) MG tablet Take 325 mg by mouth daily with breakfast.    [provider]  fluticasone (FLONASE) 50 MCG/ACT nasal spray Place 2 sprays into both nostrils daily. 12/18/15   [provider]  gabapentin (NEURONTIN) 800 MG tablet Take 800 mg by mouth 4 (four) times daily. 12/18/15   [provider]  hydrochlorothiazide (HYDRODIURIL) 12.5 MG tablet Take 1 tablet (12.5 mg total) by mouth daily. Patient taking differently: Take 25 mg by mouth daily. 11/18/20   Nolberto Hanlon, MD  ipratropium (ATROVENT) 0.02 % nebulizer solution Inhale 2.5 mL (500 mcg total) by nebulization every 6 (six) hours. 12/16/16 01/15/19  [provider]  KOMBIGLYZE XR 2.04-999 MG TB24 Take 2 tablets by mouth daily. 01/17/16   [provider]  metoprolol succinate (TOPROL-XL) 50 MG 24 hr tablet Take 50 mg by mouth daily. 02/24/20   [provider]  ondansetron (ZOFRAN) 4 MG tablet Take 4 mg by mouth every 4 (four) hours as needed for nausea or vomiting. 10/25/20    [provider]  OXYGEN Inhale 2 L into the lungs.    [provider]  pantoprazole (PROTONIX) 40 MG tablet Take 40 mg by mouth daily. 12/18/15   [provider]  simvastatin (ZOCOR) 40 MG tablet Take 1 tablet by mouth daily. 01/17/16   [provider]  TRELEGY ELLIPTA 100-62.5-25 MCG/INH AEPB Inhale 1 puff into the lungs daily. 12/31/19   [provider]

## 2021-03-02 NOTE — Progress Notes (Signed)
Rough day. More tremors and issues with holding drinks and eating. Swallow evaluation per Speech Therapy. Assisted with dinner. Tires easily. Up in chair most of the day per patient request. Palative Care  NP in to talk with family.

## 2021-03-02 NOTE — Consult Note (Addendum)
Consultation Note Date: 03/02/2021   Patient Name: Christian Fields  DOB: [birth year]  MRN: [id number]  Age / Sex: 65 y.o., male  PCP: Remi Haggard, FNP Referring Physician: Kayleen Memos, DO  Reason for Consultation: Establishing goals of care  HPI/Patient Profile: 65 year old male with known COPD on chronic 2 L nasal cannula at home and prior left upper lobe lung cancer status post radiation presents to the ED from home via EMS in acute hypoxic respiratory failure ultimately requiring BiPAP and vasopressors for septic shock.  Clinical Assessment and Goals of Care: Patient is resting in bed.  He is confused and reaching towards the sky for things in the air.  His daughter and mother are at bedside.  Since Covid he has declined.  She states that he has days that he does pretty well and is able to do laundry and other things around the house, and he has bad days where he is unable to get out of the bed.  Patient has more bad days than good days.  We discussed his diagnoses, prognosis, GOC, EOL wishes disposition and options.  Created space and opportunity for patient  to explore thoughts and feelings regarding current medical information.   A detailed discussion was had today regarding advanced directives.  Concepts specific to code status, artifical feeding and hydration, IV antibiotics and rehospitalization were discussed.  The difference between an aggressive medical intervention path and a comfort care path was discussed.  Values and goals of care important to patient and family were attempted to be elicited.  Discussed limitations of medical interventions to prolong quality of life in some situations and discussed the concept of human mortality.  Daughter would like to wait until patient is alert and oriented to be able to discuss his wishes on goals of care.  We will see how he does over the weekend.  We  will follow up on Monday.  SUMMARY OF RECOMMENDATIONS   Family will discuss CODE STATUS and further care moving forward.   Prognosis:   Poor      Primary Diagnoses: Present on Admission: . Septic shock ([place])   I have reviewed the medical record, interviewed the patient and family, and examined the patient. The following aspects are pertinent.  Past Medical History:  Diagnosis Date  . Anemia   . Anxiety   . Arthritis   . Asthma   . Cancer ([place])    Basal Cell Skin Cancer  . Chronic back pain   . COPD (chronic obstructive pulmonary disease) ([place])   . Depression   . Diabetes mellitus ([place])   . Dyspnea   . GERD (gastroesophageal reflux disease)   . Gout   . Gout   . Headache   . History of blood clots    Left Leg--July 2018  . History of kidney stones   . Hyperlipidemia   . Hyperlipidemia   . Hypertension   . Kidney stones   . Neuropathy   . On home oxygen therapy    2  L / M  . Pneumonia 06/2017  . Sleep apnea   . Ulcer of foot ([place])    Right   Social History   Socioeconomic History  . Marital status: Widowed    Spouse name: Not on file  . Number of children: Not on file  . Years of education: Not on file  . Highest education level: Not on file  Occupational History  . Not on file  Tobacco Use  . Smoking status: Former Smoker    Packs/day: 0.50    Types: Cigarettes    Start date: 2018  . Smokeless tobacco: Never Used  Vaping Use  . Vaping Use: Never used  Substance and Sexual Activity  . Alcohol use: Yes    Alcohol/week: 1.0 standard drink    Types: 1 Cans of beer per week    Comment: occ  . Drug use: No  . Sexual activity: Not on file  Other Topics Concern  . Not on file  Social History Narrative  . Not on file   Social Determinants of Health   Financial Resource Strain: Not on file  Food Insecurity: Not on file  Transportation Needs: Not on file  Physical Activity: Not on file  Stress: Not on file  Social Connections: Not on file    Family History  Problem Relation Age of Onset  . Hypertension Mother    Scheduled Meds: . aspirin EC  81 mg Oral Daily  . budesonide (PULMICORT) nebulizer solution  0.25 mg Nebulization BID  . Chlorhexidine Gluconate Cloth  6 each Topical Daily  . cholecalciferol  1,000 Units Oral Daily  . clopidogrel  75 mg Oral Daily  . DULoxetine  30 mg Oral Daily  . ezetimibe  10 mg Oral Daily  . gabapentin  800 mg Oral Once  . gabapentin  800 mg Oral QID  . heparin  5,000 Units Subcutaneous Q8H  . HYDROmorphone  2 mg Oral Q6H  . insulin aspart  0-15 Units Subcutaneous TID WC  . insulin aspart  0-5 Units Subcutaneous QHS  . ipratropium-albuterol  3 mL Nebulization Q6H  . mouth rinse  15 mL Mouth Rinse BID  . midodrine  5 mg Oral TID WC  . mupirocin ointment  1 application Nasal BID  . pantoprazole (PROTONIX) IV  40 mg Intravenous QHS  . phosphorus  500 mg Oral QID  . potassium chloride  40 mEq Oral BID  . simvastatin  40 mg Oral Daily   Continuous Infusions: . piperacillin-tazobactam (ZOSYN)  IV 3.375 g (03/02/21 0627)  . vancomycin Stopped (03/01/21 2242)   PRN Meds:.ALPRAZolam, docusate sodium, ondansetron (ZOFRAN) IV, polyethylene glycol Medications Prior to Admission:  Prior to Admission medications   Medication Sig Start Date End Date Taking? Authorizing Provider  pantoprazole (PROTONIX) 40 MG tablet Take 40 mg by mouth daily. 12/18/15  Yes [provider]  simvastatin (ZOCOR) 40 MG tablet Take 1 tablet by mouth daily. 01/17/16  Yes [provider]  TRELEGY ELLIPTA 100-62.5-25 MCG/INH AEPB Inhale 1 puff into the lungs daily. 12/31/19  Yes [provider]  albuterol (VENTOLIN HFA) 108 (90 Base) MCG/ACT inhaler Inhale 2 puffs into the lungs every 4 (four) hours as needed for wheezing or shortness of breath.    [provider]  allopurinol (ZYLOPRIM) 300 MG tablet Take 600 mg by mouth 2 (two) times daily. 02/24/20   [provider]  aspirin EC  81 MG tablet Take 1 tablet by mouth daily.    [provider]  buprenorphine-naloxone (SUBOXONE) 8-2 mg SUBL SL tablet Place 1 tablet under the tongue 3 (three) times daily.    [provider]  cholecalciferol (VITAMIN D) 1000 units tablet Take 1,000 Units by mouth daily.    [provider]  clopidogrel (PLAVIX) 75 MG tablet Take 1 tablet (75 mg total) by mouth daily. 02/02/21   Shawna Clamp, MD  DULoxetine (CYMBALTA) 30 MG capsule Take 1 capsule by mouth daily. 01/17/16   [provider]  ezetimibe (ZETIA) 10 MG tablet Take 10 mg by mouth daily. 02/24/20   [provider]  FARXIGA 10 MG TABS tablet Take 10 mg by mouth daily. 09/14/20   [provider]  ferrous sulfate 325 (65 FE) MG tablet Take 325 mg by mouth daily with breakfast.    [provider]  gabapentin (NEURONTIN) 800 MG tablet Take 800 mg by mouth 4 (four) times daily. 12/18/15   [provider]  KOMBIGLYZE XR 2.04-999 MG TB24 Take 2 tablets by mouth daily. 01/17/16   [provider]  metoprolol succinate (TOPROL-XL) 50 MG 24 hr tablet Take 50 mg by mouth daily. 02/24/20   [provider]  OXYGEN Inhale 2 L into the lungs.    [provider]   Allergies  Allergen Reactions  . Bee Venom Anaphylaxis   Review of Systems  All other systems reviewed and are negative.   Physical Exam Pulmonary:     Effort: Pulmonary effort is normal.  Neurological:     Mental Status: He is alert.     Vital Signs: BP 110/69   Pulse (!) 110   Temp 98.4 F (36.9 C)   Resp (!) 25   Ht 5\' 9"  (1.753 m)   Wt 83.2 kg   SpO2 98%   BMI 27.09 kg/m  Pain Scale: 0-10   Pain Score: 0-No pain   SpO2: SpO2: 98 % O2 Device:SpO2: 98 % O2 Flow Rate: .O2 Flow Rate (L/min): 7 L/min  IO: Intake/output summary:   Intake/Output Summary (Last 24 hours) at 03/02/2021 1557 Last data filed at 03/02/2021 0759 Gross per 24 hour  Intake 580.25 ml  Output 3050 ml   Net -2469.75 ml    LBM: Last BM Date: 03/02/21 Baseline Weight: Weight: 74.4 kg Most recent weight: Weight: 83.2 kg         Time In: 3:20 Time Out: 3:50 Time Total: 30 min Greater than 50%  of this time was spent counseling and coordinating care related to the above assessment and plan.  Signed by: Asencion Gowda, NP   Please contact Palliative Medicine Team phone at [phone] for questions and concerns.  For individual provider: See Shea Evans

## 2021-03-02 NOTE — Evaluation (Signed)
Clinical/Bedside Swallow Evaluation Patient Details  Name: Christian Fields MRN: [id number] Date of Birth: Apr 28, 1956  Today's Date: 03/02/2021 Time: SLP Start Time (ACUTE ONLY): 24 SLP Stop Time (ACUTE ONLY): 1230 SLP Time Calculation (min) (ACUTE ONLY): 60 min  Past Medical History:  Past Medical History:  Diagnosis Date  . Anemia   . Anxiety   . Arthritis   . Asthma   . Cancer ([place])    Basal Cell Skin Cancer  . Chronic back pain   . COPD (chronic obstructive pulmonary disease) ([place])   . Depression   . Diabetes mellitus ([place])   . Dyspnea   . GERD (gastroesophageal reflux disease)   . Gout   . Gout   . Headache   . History of blood clots    Left Leg--July 2018  . History of kidney stones   . Hyperlipidemia   . Hyperlipidemia   . Hypertension   . Kidney stones   . Neuropathy   . On home oxygen therapy    2 L / M  . Pneumonia 06/2017  . Sleep apnea   . Ulcer of foot ([place])    Right   Past Surgical History:  Past Surgical History:  Procedure Laterality Date  . APPENDECTOMY    . DG FEET 2 VIEWS BILAT    . LIPOMA EXCISION Right 08/15/2017   Procedure: EXCISION TUMOR(CYST) FOOT;  Surgeon: Albertine Patricia, DPM;  Location: [facility] ORS;  Service: Podiatry;  Laterality: Right;  . OTHER SURGICAL HISTORY Bilateral Foot surgery   HPI:  Pt is a 65 year old male with known COPD on chronic 2 L nasal cannula at home and left upper lobe lung cancer status post radiation presents to the ED from home via EMS in acute hypoxic respiratory failure ultimately requiring BiPAP and vasopressors for septic shock.  PMH includes ETOH use.  Per ED documentation, upon EMS arrival they were unable to obtain SPO2 readings.  Patient was placed on a nonrebreather for oxygen support with SPO2 readings in the high 80's.  He also received 2 duo nebs in route with EMS.  CXR: borderline heart size. Stable aortic and hilar contours. Congested  appearance of central vessels with diffuse interstitial   opacification. There is underlying emphysema - pulmonary edema favored per note.  Head CT in 01/2021: "Mild to Moderate severity cerebral atrophy. 2. Small chronic bilateral basal ganglia lacunar infarcts".   Assessment / Plan / Recommendation Clinical Impression  Pt appears to present w/ Mild+ oropharyngeal phase dysphagia in light of declined Cognitive status/awareness, Reduced Pulmonary status for physical tassk, and UE weakness/shakiness impeding his ability to self-feed. These issues can impact his overall awareness/engagement and safety during po tasks which increases risk for aspiration, choking. Pt's risk for aspiration is present but can be reduced when following general aspiration precautions, Rest Breaks during meals, and given feeding assistance. He requires Mild-Mod verbal/ visual cues to redirect to po tasks intermittently and to complete pharyngeal swallowing/clear mouth for presentation of the next bolus during feeding. Pt consumed trials of ice chips, thin liquids VIA CUP ONLY, purees, and soft solids moistened w/ No immediate, overt clinical s/s of aspiration noted; no decline in vocal quality during few verbalizations, no cough, and no sustained decline in respiratory status during/post trials. Of note, pt's HR at baseline was 114-121; RR mid-upper 20s, O2 sats 88-90% -- Baseline per NSG. Rest Breaks were given when increased WOB/SOB noted d/t exertoin; no talking during po trials. Pt was also easily distracted during tasks. Oral  phase was c/b increased oral phase time for bolus management and oral clearing of the solids but grossly adequate. Oral holding noted w/ trials of thin liquids requiring verbal cues intermitently to complete swallowing of boluses. This seemed to increase when pt was distracted. Pt appeared to manage the thin liquid boluses well but did mild throat clearing x2, 1x post oral holding. pt required feeding support and cup holding d/t shaky UEs. OM Exam was challenged by  difficulty following through w/ OM instructions, but No unilateral weakness noted. Pt appeared to present w/ Apraxic-like behaviors during OM exam and oral care and bit on the swab stick.  D/t pt's declined Cognitive and Pulmonary statuss, and his risk for aspiration, recommend initiation of the dysphagia level 3(mech soft diet w/ MINCED meats, gravies) w/ Thin liquids VIA CUP ONLY; aspiration precautions; reduce Distractions during meals; support w/ Feeding at meals d/t shaky UEs. Pills Whole vs Crushed in Puree for safer swallowing. NSG and SW updated. ST services recommends follow w/ Palliative Care for [place]. SLP Visit Diagnosis: Dysphagia, oropharyngeal phase (R13.12) (suspect Cognitive decline; chronic O2)    Aspiration Risk  Mild aspiration risk;Risk for inadequate nutrition/hydration    Diet Recommendation  Mech Soft diet w/ Meats MINCED w/ gravy; Thin liquids VIA CUP. 100% supervision and support feeding at meals; aspiration precautions. Monitor Respiratory status and give Rest Breaks for conservation of energy.   Medication Administration: Whole meds with puree (vs need for Crushed)    Other  Recommendations Recommended Consults:  (Palliative Care for [place]; Dietician f/u) Oral Care Recommendations: Oral care BID;Oral care before and after PO;Staff/trained caregiver to provide oral care (dentures) Other Recommendations:  (n/a currently)   Follow up Recommendations Skilled Nursing facility;24 hour supervision/assistance (TBD)      Frequency and Duration min 2x/week  1 week       Prognosis Prognosis for Safe Diet Advancement: Guarded (-Fair) Barriers to Reach Goals: Cognitive deficits;Time post onset;Severity of deficits;Behavior (pulmonary baseline)      Swallow Study   General Date of Onset: 02/26/21 HPI: Pt is a 65 year old male with known COPD on chronic 2 L nasal cannula at home and left upper lobe lung cancer status post radiation presents to the ED from home via EMS in acute  hypoxic respiratory failure ultimately requiring BiPAP and vasopressors for septic shock.  PMH includes ETOH use.  Per ED documentation, upon EMS arrival they were unable to obtain SPO2 readings.  Patient was placed on a nonrebreather for oxygen support with SPO2 readings in the high 80's.  He also received 2 duo nebs in route with EMS.  CXR: borderline heart size. Stable aortic and hilar contours. Congested  appearance of central vessels with diffuse interstitial  opacification. There is underlying emphysema - pulmonary edema favored per note.  Head CT in 01/2021: "Mild to Moderate severity cerebral atrophy. 2. Small chronic bilateral basal ganglia lacunar infarcts". Type of Study: Bedside Swallow Evaluation Previous Swallow Assessment: none Diet Prior to this Study: Regular;Thin liquids Temperature Spikes Noted: No (wbc 29.7) Respiratory Status: Nasal cannula (6-7L) History of Recent Intubation: No Behavior/Cognition: Alert;Cooperative;Pleasant mood;Confused;Distractible;Requires cueing (redirects easily) Oral Cavity Assessment: Within Functional Limits Oral Care Completed by SLP: Yes Oral Cavity - Dentition: Dentures, top (natural bottom but missing molars) Vision: Functional for self-feeding Self-Feeding Abilities: Able to feed self;Needs assist;Needs set up;Total assist (shaky UEs - baseline per pt) Patient Positioning: Upright in chair (needed support positioning upright w/ pillows) Baseline Vocal Quality: Normal (min SOB w/ exertion of talking)  Volitional Cough: Strong Volitional Swallow: Able to elicit    Oral/Motor/Sensory Function Overall Oral Motor/Sensory Function: Within functional limits (apraxic-like behaviors)   Ice Chips Ice chips: Within functional limits Presentation: Spoon (3 trials)   Thin Liquid Thin Liquid: Impaired Presentation: Cup;Self Fed (10 trials) Oral Phase Impairments: Poor awareness of bolus Oral Phase Functional Implications: Prolonged oral transit;Oral  holding (intermittent) Pharyngeal  Phase Impairments: Throat Clearing - Delayed (x2) Other Comments: easily distracted    Nectar Thick Nectar Thick Liquid: Not tested   Honey Thick Honey Thick Liquid: Not tested   Puree Puree: Within functional limits Presentation: Spoon (fed; 10 trials)   Solid     Solid: Impaired (mech soft) Presentation: Spoon;Self Fed (supported; 8 trials) Oral Phase Impairments: Impaired mastication;Poor awareness of bolus Oral Phase Functional Implications: Prolonged oral transit Pharyngeal Phase Impairments:  (none)        Orinda Kenner, MS, Camera operator Rehab Services [phone] Watson,Katherine 03/02/2021,3:28 PM

## 2021-03-02 NOTE — Progress Notes (Signed)
1830 Patient too sleepy to take 1800 pills. Not given due to aspiration precautions.

## 2021-03-02 NOTE — Progress Notes (Signed)
Met with a tearful daughter bedside, she waited for her grandmother, patient's mother to arrive, to discuss next steps. I relayed to family there is always a Chaplain available to provide support, if a and when needed.

## 2021-03-02 NOTE — Progress Notes (Signed)
Pharmacy Antibiotic Note  Assessment:  Christian Fields is a 65 y.o. male admitted on 02/26/2021 with pneumonia (CAP). Patient presented with SOB x 2 days. Has a PMH of left upper lobe lung cancer and COPD on chronic 2L [place]. Patient has AKI that is improving. Pharmacy has been consulted for vancomycin and zosyn dosing.    3/16: CT chest noted nodules in left upper lobe. 3/22: CT abdomen showed new bilateral lobe airspace disease suspicious for pneumonia. 3/23: CXR: bibasilar infiltrates/edema 3/25 CXR: pulmonary edema  - Scr (baseline ~1): 3.05 > 2.78 > 1.68 >1.46>1.23 - Procal: 99.3 > 129.7 > >150 >41.55 - WBC: 19.1 > 24.8 > 23.4 > 29.7  Day 5 abx    Plan: - Continue vancomycin 1250mg  q24h --- Estimated AUC: 531.2, Cmin: 13.9 --- Check vancomycin level with Monday dose - Continue zosyn 3.375g q8h - Continue to monitor renal function daily  - F/u with BCx and susceptibilities for de-escalation  Height: 5\' 9"  (175.3 cm) Weight: 83.2 kg (183 lb 6.8 oz) IBW/kg (Calculated) : 70.7  Temp (24hrs), Avg:98.3 F (36.8 C), Min:97.7 F (36.5 C), Max:98.8 F (37.1 C)  Recent Labs  Lab 02/26/21 1915 02/27/21 0155 02/27/21 0319 02/27/21 0813 02/27/21 0950 02/27/21 1500 02/27/21 1819 02/28/21 0441 02/28/21 1034 02/28/21 1333 03/01/21 0528 03/02/21 0444  WBC 19.1*  --  24.8*  --   --   --   --  23.4*  --   --  23.9* 29.7*  CREATININE 3.05*  --  2.78*  --   --   --   --  1.68*  --   --  1.46* 1.23  LATICACIDVEN  --    < >  --    < > 6.1* 6.5* 4.4*  --  1.6 1.3  --   --    < > = values in this interval not displayed.    Estimated Creatinine Clearance: 59.1 mL/min (by C-G formula based on SCr of 1.23 mg/dL).    Allergies  Allergen Reactions  . Bee Venom Anaphylaxis    Antimicrobials this admission:  Vancomycin 3/21>>  Zosyn 3/22>>  Azithromycin x 1 dose  Cefepime x 1 dose  Microbiology results:  03/21 BCx: NGTD 03/22 MRSA PCR: positive  Thank you for allowing pharmacy to  be a part of this patient's care.  Benn Moulder, PharmD Pharmacy Resident  03/02/2021 7:44 AM

## 2021-03-03 ENCOUNTER — Encounter: Payer: Self-pay | Admitting: Pulmonary Disease

## 2021-03-03 LAB — CULTURE, BLOOD (ROUTINE X 2)
Culture: NO GROWTH
Culture: NO GROWTH
Special Requests: ADEQUATE
Special Requests: ADEQUATE

## 2021-03-03 LAB — EXPECTORATED SPUTUM ASSESSMENT W GRAM STAIN, RFLX TO RESP C

## 2021-03-03 LAB — MAGNESIUM
Magnesium: 1.1 mg/dL — ABNORMAL LOW (ref 1.7–2.4)
Magnesium: 2.4 mg/dL (ref 1.7–2.4)

## 2021-03-03 LAB — BASIC METABOLIC PANEL
Anion gap: 13 (ref 5–15)
BUN: 25 mg/dL — ABNORMAL HIGH (ref 8–23)
CO2: 28 mmol/L (ref 22–32)
Calcium: 8.5 mg/dL — ABNORMAL LOW (ref 8.9–10.3)
Chloride: 100 mmol/L (ref 98–111)
Creatinine, Ser: 1.16 mg/dL (ref 0.61–1.24)
GFR, Estimated: >60 mL/min (ref >60)
Glucose, Bld: 79 mg/dL (ref 70–99)
Potassium: 4.1 mmol/L (ref 3.5–5.1)
Sodium: 141 mmol/L (ref 135–145)

## 2021-03-03 LAB — CBC WITH DIFFERENTIAL/PLATELET
Abs Immature Granulocytes: 0.25 10*3/uL — ABNORMAL HIGH (ref 0.00–0.07)
Basophils Absolute: 0.1 10*3/uL (ref 0.0–0.1)
Basophils Relative: 0 %
Eosinophils Absolute: 0.1 10*3/uL (ref 0.0–0.5)
Eosinophils Relative: 0 %
HCT: 32.4 % — ABNORMAL LOW (ref 39.0–52.0)
Hemoglobin: 10.2 g/dL — ABNORMAL LOW (ref 13.0–17.0)
Immature Granulocytes: 1 %
Lymphocytes Relative: 4 %
Lymphs Abs: 1.1 10*3/uL (ref 0.7–4.0)
MCH: 29.7 pg (ref 26.0–34.0)
MCHC: 31.5 g/dL (ref 30.0–36.0)
MCV: 94.2 fL (ref 80.0–100.0)
Monocytes Absolute: 1.7 10*3/uL — ABNORMAL HIGH (ref 0.1–1.0)
Monocytes Relative: 7 %
Neutro Abs: 21.2 10*3/uL — ABNORMAL HIGH (ref 1.7–7.7)
Neutrophils Relative %: 88 %
Platelets: 170 10*3/uL (ref 150–400)
RBC: 3.44 MIL/uL — ABNORMAL LOW (ref 4.22–5.81)
RDW: 15.2 % (ref 11.5–15.5)
WBC: 24.5 10*3/uL — ABNORMAL HIGH (ref 4.0–10.5)
nRBC: 0.1 % (ref 0.0–0.2)

## 2021-03-03 LAB — PHOSPHORUS
Phosphorus: 1.6 mg/dL — ABNORMAL LOW (ref 2.5–4.6)
Phosphorus: 1.3 mg/dL — ABNORMAL LOW (ref 2.5–4.6)

## 2021-03-03 LAB — GLUCOSE, CAPILLARY
Glucose-Capillary: 97 mg/dL (ref 70–99)
Glucose-Capillary: 240 mg/dL — ABNORMAL HIGH (ref 70–99)
Glucose-Capillary: 156 mg/dL — ABNORMAL HIGH (ref 70–99)
Glucose-Capillary: 164 mg/dL — ABNORMAL HIGH (ref 70–99)

## 2021-03-03 LAB — PROCALCITONIN: Procalcitonin: 25.51 ng/mL

## 2021-03-03 LAB — BRAIN NATRIURETIC PEPTIDE: B Natriuretic Peptide: 1303 pg/mL — ABNORMAL HIGH (ref 0.0–100.0)

## 2021-03-03 MED ORDER — MAGNESIUM SULFATE 2 GM/50ML IV SOLN
2.0000 g | Freq: Once | INTRAVENOUS | Status: AC
  Administered 2021-03-03: 2 g via INTRAVENOUS
  Filled 2021-03-03: qty 50

## 2021-03-03 MED ORDER — ALLOPURINOL 300 MG PO TABS
300.0000 mg | ORAL_TABLET | Freq: Two times a day (BID) | ORAL | Status: DC
  Administered 2021-03-03 – 2021-03-09 (×12): 300 mg via ORAL
  Filled 2021-03-03 (×13): qty 1

## 2021-03-03 MED ORDER — MAGNESIUM SULFATE 4 GM/100ML IV SOLN
4.0000 g | Freq: Once | INTRAVENOUS | Status: AC
  Administered 2021-03-03: 4 g via INTRAVENOUS
  Filled 2021-03-03: qty 100

## 2021-03-03 MED ORDER — PANTOPRAZOLE SODIUM 40 MG PO TBEC
40.0000 mg | DELAYED_RELEASE_TABLET | Freq: Every day | ORAL | Status: DC
  Administered 2021-03-03 – 2021-03-07 (×5): 40 mg via ORAL
  Filled 2021-03-03 (×5): qty 1

## 2021-03-03 MED ORDER — METOPROLOL SUCCINATE ER 25 MG PO TB24
12.5000 mg | ORAL_TABLET | Freq: Every day | ORAL | Status: DC
  Administered 2021-03-03 – 2021-03-09 (×7): 12.5 mg via ORAL
  Filled 2021-03-03: qty 1
  Filled 2021-03-03: qty 0.5
  Filled 2021-03-03: qty 1
  Filled 2021-03-03 (×3): qty 0.5
  Filled 2021-03-03: qty 1

## 2021-03-03 MED ORDER — ALUM & MAG HYDROXIDE-SIMETH 200-200-20 MG/5ML PO SUSP
30.0000 mL | Freq: Once | ORAL | Status: AC
  Administered 2021-03-03: 30 mL via ORAL
  Filled 2021-03-03: qty 30

## 2021-03-03 MED ORDER — LIDOCAINE VISCOUS HCL 2 % MT SOLN
15.0000 mL | Freq: Once | OROMUCOSAL | Status: AC
  Administered 2021-03-03: 15 mL via ORAL
  Filled 2021-03-03: qty 15

## 2021-03-03 MED ORDER — SODIUM CHLORIDE 0.9% FLUSH
10.0000 mL | Freq: Two times a day (BID) | INTRAVENOUS | Status: DC
  Administered 2021-03-03: 20 mL
  Administered 2021-03-03 – 2021-03-08 (×10): 10 mL

## 2021-03-03 MED ORDER — VANCOMYCIN HCL 1500 MG/300ML IV SOLN
1500.0000 mg | INTRAVENOUS | Status: DC
  Administered 2021-03-03 – 2021-03-05 (×3): 1500 mg via INTRAVENOUS
  Filled 2021-03-03 (×5): qty 300

## 2021-03-03 MED ORDER — SODIUM CHLORIDE 0.9% FLUSH
10.0000 mL | INTRAVENOUS | Status: DC | PRN
  Administered 2021-03-06: 10 mL

## 2021-03-03 MED ORDER — POTASSIUM & SODIUM PHOSPHATES 280-160-250 MG PO PACK
1.0000 | PACK | Freq: Three times a day (TID) | ORAL | Status: AC
  Administered 2021-03-03 (×3): 1 via ORAL
  Filled 2021-03-03 (×3): qty 1

## 2021-03-03 NOTE — Progress Notes (Signed)
[place] for Electrolyte Monitoring and Replacement   Recent Labs: Potassium (mmol/L)  Date Value  03/03/2021 4.1  05/17/2014 3.5   Magnesium (mg/dL)  Date Value  03/03/2021 1.1 (L)   Calcium (mg/dL)  Date Value  03/03/2021 8.5 (L)   Calcium, Total (mg/dL)  Date Value  03/31/2014 9.3   Albumin (g/dL)  Date Value  02/26/2021 3.5  09/24/2013 3.6   Phosphorus (mg/dL)  Date Value  03/03/2021 1.6 (L)   Sodium (mmol/L)  Date Value  03/03/2021 141  03/31/2014 136   Corrected Ca: 8.9 mg/dL  Assessment: 65 yo M presented with shortness of breath concerning for CAP. PMH includes COPD, diabetes, L upper lobe lung cancer s/p radiation, chronic back pain. Pharmacy consulted to assist in electrolyte management.    Goal of Therapy:  Electrolytes WNL  Plan:   Phos-Nak 1 packet po x 3 (each packet contains 250 mg elemental phosphorous) per NP  6 grams total IV magnesium sulfate per NP  Recheck magnesium at 1600  Recheck phosphorous at 2000  Monitor electrolytes with AM labs  Dallie Piles, PharmD 03/03/2021 12:46 PM

## 2021-03-03 NOTE — Progress Notes (Signed)
Patient alert and awake this AM. Assisted up to chair and BSC. Patient a/o x4. Daughter is at bedside. Patient weak, 1 person mod assist with transfers. Continues to have a weak, non-productive cough. Encouraged deep breathing and coughing as well as I.S. teaching completed with teach back performed. Patient verbalizing understanding of importance of aggressive pulmonary toileting.

## 2021-03-03 NOTE — Plan of Care (Signed)
  Problem: Clinical Measurements: Goal: Will remain free from infection Outcome: Progressing Vitals stable with no s/sx of new infection noted. Patient remains afebrile.  Goal: Respiratory complications will improve Outcome: Progressing  Oxygenation improved, able to wean o2- encouraging use of I.S. 10x q1h while awake and cough/deep breathing.   Problem: Activity: Goal: Risk for activity intolerance will decrease Outcome: Progressing  Patient able to tolerate up to chair with stand by assist. Up in chair > 163min.   Problem: Nutrition: Goal: Adequate nutrition will be maintained Outcome: Progressing  Patient appetite improved. Family encouraged to bring foods that patient favors to help promote adequate nutrition.

## 2021-03-03 NOTE — Evaluation (Signed)
Occupational Therapy Evaluation Patient Details Name: Christian Fields MRN: [id number] DOB: March 14, 1956 Today's Date: 03/03/2021    History of Present Illness 65 y.o. male admitted on 02/26/2021 with pneumonia (CAP). Patient presented with SOB x 2 days. Has a PMH of left upper lobe lung cancer and COPD on chronic 2L [place]. Patient was admitted with AKI that is improving. Pharmacy was consulted for vancomycin and Zosyn dosing.   Clinical Impression   Patient presenting with decreased I in self care, balance, functional mobility/transfer, endurance, and safety awareness.  Patient reports living at home with son's girlfriend but she does not provide any assistance. He reports she is "in and out". Pt reports being independent at baseline and on 2 Ls at baseline. Per chart review, pt reported using RW on "bad days". His daughter takes him to appointments and assists him with some IADL tasks as needed but works during the day. Patient currently on 6 L via [place]. He fatigued very quickly with tasks. He has been sitting up in recliner chair for the last 2 hours and stands with mod A x 3 reps. Pt ambulating 6-7' forwards and then backwards x 2 reps with rest break in between with mod A for balance. Pt appears very weak. OT discussing recommendation of 24/7 supervision/assistance and short term rehab stay before returning home. Pt wanting to return home in spite of this. OT recommendation will remain SNF for safety. Patient will benefit from acute OT to increase overall independence in the areas of ADLs, functional mobility, and safety awareness in order to safely discharge to next venue of care.    Follow Up Recommendations  SNF;Supervision/Assistance - 24 hour    Equipment Recommendations  Other (comment) (defer to next venue of care)       Precautions / Restrictions Precautions Precautions: Fall      Mobility Bed Mobility               General bed mobility comments: seated in recliner chair upon  entering the room    Transfers Overall transfer level: Needs assistance Equipment used: Rolling walker (2 wheeled) Transfers: Sit to/from [facility] Sit to Stand: Mod assist Stand pivot transfers: Mod assist       General transfer comment: mod lifting assistance to stand from recliner chair with mod A for balance with ambulation 6-7' forwards and then back with HHA.    Balance Overall balance assessment: Needs assistance Sitting-balance support: Feet supported Sitting balance-Leahy Scale: Good     Standing balance support: During functional activity Standing balance-Leahy Scale: Poor Standing balance comment: mod A for balance                           ADL either performed or assessed with clinical judgement   ADL Overall ADL's : Needs assistance/impaired                     Lower Body Dressing: Minimal assistance;Sitting/lateral leans Lower Body Dressing Details (indicate cue type and reason): to don B socks while seated at edge of recliner chair Toilet Transfer: Moderate assistance;BSC Toilet Transfer Details (indicate cue type and reason): simulated         Functional mobility during ADLs: Moderate assistance       Vision Baseline Vision/History: Wears glasses Wears Glasses: At all times Patient Visual Report: No change from baseline              Pertinent Vitals/Pain Pain  Assessment: Faces Faces Pain Scale: Hurts even more Pain Location: L wrist Pain Descriptors / Indicators: Aching;Discomfort;Grimacing;Guarding Pain Intervention(s): Limited activity within patient's tolerance;Monitored during session;Other (comment) (notified RN)     Hand Dominance Right   Extremity/Trunk Assessment Upper Extremity Assessment Upper Extremity Assessment: Generalized weakness;LUE deficits/detail LUE Deficits / Details: RN notified of pain this session. Pt is usure of why it is hurting. LUE: Unable to fully assess due to pain    Lower Extremity Assessment Lower Extremity Assessment: Generalized weakness       Communication Communication Communication: No difficulties   Cognition Arousal/Alertness: Awake/alert Behavior During Therapy: WFL for tasks assessed/performed Overall Cognitive Status: Within Functional Limits for tasks assessed                                 General Comments: Pt does show limited awareness of deficits during session but is overall pleasant. Alert and oriented x4              Home Living Family/patient expects to be discharged to:: Private residence Living Arrangements: Non-relatives/Friends Available Help at Discharge: Family;Available PRN/intermittently Type of Home: Mobile home Home Access: Stairs to enter Entrance Stairs-Number of Steps: 5-6 Entrance Stairs-Rails: Right;Left Home Layout: One level     Bathroom Shower/Tub: Teacher, early years/pre: Standard     Home Equipment: Environmental consultant - 2 wheels;Shower seat   Additional Comments: Pt reports he does not use any AD at home but in previous evaluations he has reported using RW when having a "bad day"      Prior Functioning/Environment Level of Independence: Independent with assistive device(s)        Comments: Pt reports his son (currentyly incarcerated) girlfriend lives with him but she does not help him. He has a daughter who checks in on him and takes him to appoinments but works during the day.  Pt is on 2 L O2 at baseline.        OT Problem List: Decreased strength;Impaired balance (sitting and/or standing);Decreased cognition;Pain;Decreased activity tolerance;Decreased knowledge of use of DME or AE;Cardiopulmonary status limiting activity;Decreased safety awareness      OT Treatment/Interventions: Self-care/ADL training;Therapeutic exercise;Energy conservation;DME and/or AE instruction;Therapeutic activities;Balance training;Patient/family education;Manual therapy    OT Goals(Current  goals can be found in the care plan section) Acute Rehab OT Goals Patient Stated Goal: to go home OT Goal Formulation: With patient Time For Goal Achievement: 03/17/21 Potential to Achieve Goals: Fair ADL Goals Pt Will Perform Grooming: (P) with modified independence;standing Pt Will Transfer to Toilet: (P) with modified independence;ambulating Pt Will Perform Toileting - Clothing Manipulation and hygiene: (P) with modified independence;sit to/from stand Pt/caregiver will Perform Home Exercise Program: (P) Increased strength;Both right and left upper extremity;With theraband;With written HEP provided;With Supervision  OT Frequency: Min 2X/week   Barriers to D/C: Decreased caregiver support             AM-PAC OT "6 Clicks" Daily Activity     Outcome Measure Help from another person eating meals?: None Help from another person taking care of personal grooming?: A Little Help from another person toileting, which includes using toliet, bedpan, or urinal?: A Lot Help from another person bathing (including washing, rinsing, drying)?: A Lot Help from another person to put on and taking off regular upper body clothing?: A Little Help from another person to put on and taking off regular lower body clothing?: A Lot 6 Click Score: 16  End of Session Equipment Utilized During Treatment: Oxygen (6L O2 via [place]) Nurse Communication: Mobility status  Activity Tolerance: Patient limited by fatigue Patient left: in chair;with call bell/phone within reach  OT Visit Diagnosis: Unsteadiness on feet (R26.81);Muscle weakness (generalized) (M62.81)                Time: 5271-2929 OT Time Calculation (min): 32 min Charges:  OT General Charges $OT Visit: 1 Visit OT Evaluation $OT Eval Moderate Complexity: 1 Mod OT Treatments $Self Care/Home Management : 8-22 mins $Therapeutic Activity: 8-22 mins  Darleen Crocker, MS, OTR/L , CBIS ascom [phone]  03/03/21, 2:16 PM

## 2021-03-03 NOTE — Progress Notes (Addendum)
PROGRESS NOTE  Christian Fields NOB:[id number] DOB: November 04, 1956 DOA: 02/26/2021 PCP: Remi Haggard, FNP  HPI/Recap of past 72 hours: 65 year old male with known COPD on chronic 2 L nasal cannula at home and prior left upper lobe lung cancer status post radiation presents to the ED from home via EMS in acute hypoxic respiratory failure ultimately requiring BiPAP and vasopressors for septic shock. Per ED documentation, upon EMS arrival they were unable to obtain SPO2 readings.  Patient was placed on a nonrebreather for oxygen support with SPO2 readings in the high 80's.  He also received 2 duo nebs in route with EMS. ED course: Initial vitals-99.1, tachypneic at 25, tachycardic at 138, BP 130/116, SPO2 80% on nonrebreather at 15 L.  Patient was worked up for sepsis on arrival, receiving 2.5 liters with azithromycin and cefepime for CAP coverage.  Patient ultimately requiring BiPAP & vasopressor support. Significant labs: Procalcitonin elevated at 99.28, BNP elevated at 1149.5, lactic acidosis 7.4, hypokalemia at 2.9 & AKI: BUN 35/creatinine 3.05 elevated from 1.05 in February 2022. PCCM consulted for admission due to vasopressor administration & BiPAP support.  Pertinent  Medical History  COPD on chronic 2 L nasal cannula Type 2 diabetes mellitus Chronic back pain on Suboxone Hypertension Hyperlipidemia Left upper lobe cancer status post radiation Polysubstance abuse AAA  Significant Hospital Events: Including procedures, antibiotic start and stop dates in addition to other pertinent events    02/26/2021 -admit to ICU due to septic shock requiring vasopressor administration and BiPAP support.  02/27/2021- Left femoral CVC placed due to high dose pressors. Tolerating BiPAP, VBG improving. ABX changed to Vancomycin & Zosyn due to worsening lactic acidosis  02/28/2021- Fluids stopped overnight due to "sounding wet", CXR without interim change.  Vasopressin weaned down, decreasing Levophed  requirements, lactic acidosis resolved  Cultures:  3/21: SARS-CoV-2 PCR>>negative 3/21: Influenza PCR>>negative 3/21: Blood culture x2>> 3/22: Strep pneumo urinary antigen>>negative 3/22: Legionella urinary antigen>> 3/22: Mycoplasma Pneumoniae antibody>> 3/22: MRSA PCR>>positive 3/23: Sputum>>  Antimicrobials:  Azithromycin 3/21>>3/22 Cefepime 3/21>>3/22 Vancomycin 3/21 x1 dose, 3/22>> Zosyn 3/22>>  Speech therapist consulted on 03/02/2021, swallow evaluation revealed mild aspiration risk with recommendation for mechanical soft diet with meat minced with gravy, thin liquids via cup, 100% supervision and support feeding at meals.  Home meds with pure versus need for crushed  [name] assumed care on 03/02/2021.  Subjective: 03/03/21: Patient was seen and examined at his bedside this morning.  There were no acute events overnight.  He denies having any chest pain or dyspnea at rest.  Updated his daughter via phone.  She reports that he has burning sensation after he ate.  GI cocktail ordered.  He is currently on IV Protonix 40 mg daily, will switch to oral.  Assessment/Plan: Active Problems:   Acute respiratory failure with hypoxia (HCC)   Septic shock (HCC)  Septic shock, shock has resolved, he is no longer on vasopressor, secondary to right lower lobe pneumonia. Currently off IV vasopressors, however he is on midodrine 5 mg 3 times daily. Maintaining his MAP greater than 65. Continue IV vancomycin and Zosyn. MRSA screening test positive on 02/27/2021 Repeat sputum culture, previous sample was not enough. Procalcitonin downtrending 25 from greater than 150 on 02/28/2021. WBC downtrending, 24 from 29 Monitor fever curve and WBC. Continue current IV antibiotics regimen.  Acute hypoxic hypercarbic respiratory failure secondary to right lower lobe pneumonia and pulmonary edema. Not on oxygen supplementation at baseline Maintain O2 saturation between 88 and 94% as recommended by  pulmonary. Wean off oxygen supplementation as tolerated. Continue BiPAP at night.  Acute on chronic diastolic CHF Last 2D echo done on 02/28/2021 showed normal LVEF 55 to 60% with grade 2 diastolic dysfunction. BNP uprising greater than 2400 from 1100 on 02/26/2021 Start strict I's and O's and daily weights Off diuretics due to recent septic shock requiring vasopressors Start fluid restriction.  Resolved nonoliguric AKI Baseline creatinine appears to be 1.0 GFR greater than 60 Presented with creatinine of 3.05 with GFR 22. Creatinine 1.1 with GFR greater than 60. Continue to avoid nephrotoxic agents Continue to monitor urine output Last urine output 3.1 L recorded in the last 24 hours. Repeat renal panel in the morning.  Dysphagia with mild risk for aspiration. Currently on dysphagia 3 diet Appreciate speech therapy's assessment and assistance. Continue aspiration precautions.  GERD Reported burning pain after eating his breakfast this morning GI cocktail ordered Continue p.o. Protonix 40 mg daily.  History of lacunar CVA Head CT done on 01/29/2021, ordered after unwitnessed fall revealed: Small chronic bilateral basal ganglia lacunar infarcts. States he has been dropping things unintentionally from his hands. Continue aspirin Plavix and Zetia.  Atypical chest pain, reproducible on palpation. Troponin elevated, peaked at 56 and trended down. Continue to monitor on telemetry.  Resolved refractory hypokalemia Serum potassium 3.0> 4.1.  Hypomagnesemia Serum magnesium 1.1 Repleted intravenously Repeat level in the morning.  Improving post repletion: Hypophosphatemia Serum phosphorus 1.4> 1.6 Repleted orally. Repeat level in the morning  Chronic back pain on Suboxone PTA Normally on Suboxone at home as well as gabapentin. Reduce home dose of gabapentin, he was taking 800 mg 4 times a day. UDS positive for benzodiazepine and opiates.   TOC will provide resources prior  to DC.  Essential hypertension, BPs are currently soft. Recently off vasopressors due to septic shock.  Continue to hold off home oral antihypertensives. He is currently on midodrine 5 mg 3 times daily to maintain MAP greater than 65, continue. Restart beta-blocker to avoid beta-blockade withdrawal. He was on Toprol-XL 50 mg daily, resume at 12.5 mg daily and adjust doses as needed.  Impaired glucose tolerance He is on insulin sliding scale. Hemoglobin A1c 5.5 on 01/30/2021 Avoid hypoglycemia.  Physical debility PT OT to assess Fall precautions.    Code Status: Full code.  Family Communication: Updated his daughter via phone  Disposition Plan: Likely will discharge to home once hemodynamically stable and on oxygen requirement has improved..   Consultants:  Pulmonary  Procedures:  None.  Antimicrobials:  IV vancomycin and Zosyn.  DVT prophylaxis: SQ heparin 3 times daily.  Status is: Inpatient    Dispo:  Patient From: Home  Planned Disposition: Home  Medically stable for discharge: No, ongoing management of sepsis secondary to right lower lobe pneumonia.  Shock has resolved, now off vasopressors.          Objective: Vitals:   03/03/21 0600 03/03/21 0700 03/03/21 0800 03/03/21 1000  BP:  113/76 113/69 117/80  Pulse: (!) 115 (!) 114 (!) 108 (!) 115  Resp: (!) 25 (!) 28 17 (!) 23  Temp:   98 F (36.7 C)   TempSrc:   Oral   SpO2: 100% 95% 100% 96%  Weight:      Height:        Intake/Output Summary (Last 24 hours) at 03/03/2021 1047 Last data filed at 03/03/2021 0900 Gross per 24 hour  Intake 275 ml  Output 2250 ml  Net -1975 ml   [employer]  02/28/21 0454 03/01/21 0500 03/03/21 0450  Weight: 81.1 kg 83.2 kg 81.1 kg    Exam:  . General: 65 y.o. year-old male chronically ill-appearing no acute distress.  He is alert and oriented x3.   . Cardiovascular: Regular rate and rhythm no rubs or gallops.   [name] Kitchen Respiratory: Mild rales at bases no  wheezing noted.  Good inspiratory effort.   . Abdomen: Soft nontender normal bowel sounds present.   . Musculoskeletal: No lower extremity edema bilaterally. . Skin: No ulcerative lesions noted. [name] Kitchen Psychiatry: Mood is appropriate for condition and setting.   Data Reviewed: CBC: Recent Labs  Lab 02/26/21 1915 02/27/21 0319 02/28/21 0441 03/01/21 0528 03/02/21 0444 03/03/21 0428  WBC 19.1* 24.8* 23.4* 23.9* 29.7* 24.5*  NEUTROABS 17.8*  --  17.1* 22.7* 27.9* 21.2*  HGB 13.1 11.3* 9.4* 8.7* 9.6* 10.2*  HCT 42.7 36.1* 27.7* 26.5* 28.9* 32.4*  MCV 97.0 97.0 91.4 93.0 91.7 94.2  PLT 275 210 148* 128* 145* 794   Basic Metabolic Panel: Recent Labs  Lab 02/27/21 0319 02/27/21 0950 02/27/21 1819 02/28/21 0441 03/01/21 0528 03/01/21 1554 03/02/21 0444 03/02/21 1354 03/03/21 0428  NA 139  --   --  137 139  --  140  --  141  K 3.2*   < > 4.0 3.4* 2.6* 3.0* 3.0* 3.1* 4.1  CL 106  --   --  103 102  --  105  --  100  CO2 21*  --   --  25 24  --  26  --  28  GLUCOSE 142*  --   --  156* 95  --  121*  --  79  BUN 40*  --   --  39* 39*  --  34*  --  25*  CREATININE 2.78*  --   --  1.68* 1.46*  --  1.23  --  1.16  CALCIUM 7.8*  --   --  7.5* 7.8*  --  8.4*  --  8.5*  MG 0.5*   < > 1.7 1.7 2.1  --  1.7  --  1.1*  PHOS 3.5  --   --  2.8 2.5  --  1.4*  --  1.6*   < > = values in this interval not displayed.   GFR: Estimated Creatinine Clearance: 62.6 mL/min (by C-G formula based on SCr of 1.16 mg/dL). Liver Function Tests: Recent Labs  Lab 02/26/21 1915  AST 27  ALT 11  ALKPHOS 59  BILITOT 0.5  PROT 6.8  ALBUMIN 3.5   No results for input(s): LIPASE, AMYLASE in the last 168 hours. No results for input(s): AMMONIA in the last 168 hours. Coagulation Profile: No results for input(s): INR, PROTIME in the last 168 hours. Cardiac Enzymes: No results for input(s): CKTOTAL, CKMB, CKMBINDEX, TROPONINI in the last 168 hours. BNP (last 3 results) No results for input(s): PROBNP in the  last 8760 hours. HbA1C: No results for input(s): HGBA1C in the last 72 hours. CBG: Recent Labs  Lab 03/02/21 0732 03/02/21 1125 03/02/21 1558 03/02/21 2119 03/03/21 0754  GLUCAP 108* 121* 145* 104* 97   Lipid Profile: No results for input(s): CHOL, HDL, LDLCALC, TRIG, CHOLHDL, LDLDIRECT in the last 72 hours. Thyroid Function Tests: No results for input(s): TSH, T4TOTAL, FREET4, T3FREE, THYROIDAB in the last 72 hours. Anemia Panel: No results for input(s): VITAMINB12, FOLATE, FERRITIN, TIBC, IRON, RETICCTPCT in the last 72 hours. Urine analysis:    Component Value Date/Time   COLORURINE YELLOW (  A) 02/27/2021 0330   APPEARANCEUR HAZY (A) 02/27/2021 0330   APPEARANCEUR Clear 03/24/2013 1853   LABSPEC 1.017 02/27/2021 0330   LABSPEC 1.020 03/24/2013 1853   PHURINE 5.0 02/27/2021 0330   GLUCOSEU 150 (A) 02/27/2021 0330   GLUCOSEU Negative 03/24/2013 1853   HGBUR LARGE (A) 02/27/2021 0330   BILIRUBINUR NEGATIVE 02/27/2021 0330   BILIRUBINUR Negative 03/24/2013 1853   KETONESUR NEGATIVE 02/27/2021 0330   PROTEINUR 30 (A) 02/27/2021 0330   NITRITE NEGATIVE 02/27/2021 0330   LEUKOCYTESUR NEGATIVE 02/27/2021 0330   LEUKOCYTESUR Negative 03/24/2013 1853   Sepsis Labs: @LABRCNTIP (procalcitonin:4,lacticidven:4)  ) Recent Results (from the past 240 hour(s))  Resp Panel by RT-PCR (Flu A&B, Covid) Nasopharyngeal Swab     Status: None   Collection Time: 02/26/21  7:11 PM   Specimen: Nasopharyngeal Swab; Nasopharyngeal(NP) swabs in vial transport medium  Result Value Ref Range Status   SARS Coronavirus 2 by RT PCR NEGATIVE NEGATIVE Final    Comment: (NOTE) SARS-CoV-2 target nucleic acids are NOT DETECTED.  The SARS-CoV-2 RNA is generally detectable in upper respiratory specimens during the acute phase of infection. The lowest concentration of SARS-CoV-2 viral copies this assay can detect is 138 copies/mL. A negative result does not preclude SARS-Cov-2 infection and should not be  used as the sole basis for treatment or other patient management decisions. A negative result may occur with  improper specimen collection/handling, submission of specimen other than nasopharyngeal swab, presence of viral mutation(s) within the areas targeted by this assay, and inadequate number of viral copies(<138 copies/mL). A negative result must be combined with clinical observations, patient history, and epidemiological information. The expected result is Negative.  Fact Sheet for Patients:  EntrepreneurPulse.com.au  Fact Sheet for Healthcare Providers:  IncredibleEmployment.be  This test is no t yet approved or cleared by the [place] FDA and  has been authorized for detection and/or diagnosis of SARS-CoV-2 by FDA under an Emergency Use Authorization (EUA). This EUA will remain  in effect (meaning this test can be used) for the duration of the COVID-19 declaration under Section 564(b)(1) of the Act, 21 U.S.C.section 360bbb-3(b)(1), unless the authorization is terminated  or revoked sooner.       Influenza A by PCR NEGATIVE NEGATIVE Final   Influenza B by PCR NEGATIVE NEGATIVE Final    Comment: (NOTE) The Xpert Xpress SARS-CoV-2/FLU/RSV plus assay is intended as an aid in the diagnosis of influenza from Nasopharyngeal swab specimens and should not be used as a sole basis for treatment. Nasal washings and aspirates are unacceptable for Xpert Xpress SARS-CoV-2/FLU/RSV testing.  Fact Sheet for Patients: EntrepreneurPulse.com.au  Fact Sheet for Healthcare Providers: IncredibleEmployment.be  This test is not yet approved or cleared by the [place] FDA and has been authorized for detection and/or diagnosis of SARS-CoV-2 by FDA under an Emergency Use Authorization (EUA). This EUA will remain in effect (meaning this test can be used) for the duration of the COVID-19 declaration under Section  564(b)(1) of the Act, 21 U.S.C. section 360bbb-3(b)(1), unless the authorization is terminated or revoked.  Performed at [facility], [address] [place], [place] [address]   Blood culture (routine x 2)     Status: None   Collection Time: 02/26/21  7:11 PM   Specimen: BLOOD  Result Value Ref Range Status   Specimen Description BLOOD BLOOD RIGHT ARM  Final   Special Requests   Final    BOTTLES DRAWN AEROBIC AND ANAEROBIC Blood Culture adequate volume   Culture  Final    NO GROWTH 5 DAYS Performed at [facility], [address] [place], [place] [address]    Report Status 03/03/2021 FINAL  Final  Blood culture (routine x 2)     Status: None   Collection Time: 02/26/21  7:11 PM   Specimen: BLOOD  Result Value Ref Range Status   Specimen Description BLOOD BLOOD LEFT ARM  Final   Special Requests   Final    BOTTLES DRAWN AEROBIC AND ANAEROBIC Blood Culture adequate volume   Culture   Final    NO GROWTH 5 DAYS Performed at [facility], [address] [place], [place] [address]    Report Status 03/03/2021 FINAL  Final  MRSA PCR Screening     Status: Abnormal   Collection Time: 02/27/21 10:58 PM   Specimen: Nasopharyngeal  Result Value Ref Range Status   MRSA by PCR POSITIVE (A) NEGATIVE Final    Comment:        The GeneXpert MRSA Assay (FDA approved for NASAL specimens only), is one component of a comprehensive MRSA colonization surveillance program. It is not intended to diagnose MRSA infection nor to guide or monitor treatment for MRSA infections. RESULT CALLED TO, READ BACK BY AND VERIFIED WITH: CICI VANN'BRAY @0047  ON 02/28/21 SKL Performed at [facility], [place], [place] [address]   Expectorated Sputum Assessment w Gram Stain, Rflx to Resp Cult     Status: None   Collection Time: 02/28/21  5:32 PM   Specimen: Expectorated Sputum  Result Value Ref Range Status   Specimen Description EXPECTORATED  SPUTUM  Final   Special Requests NONE  Final   Sputum evaluation   Final    Sputum specimen not acceptable for testing.  Please recollect.   RESULT CALLED TO, READ BACK BY AND VERIFIED WITH: CHARLIE FOEEPWOOD AT [place] ON 02/28/21 BY SS Performed at [facility], [address] [place], [place] [address]    Report Status 02/28/2021 FINAL  Final  Expectorated Sputum Assessment w Gram Stain, Rflx to Resp Cult     Status: None   Collection Time: 03/03/21  6:46 AM   Specimen: Sputum  Result Value Ref Range Status   Specimen Description SPUTUM  Final   Special Requests NONE  Final   Sputum evaluation   Final    THIS SPECIMEN IS ACCEPTABLE FOR SPUTUM CULTURE Performed at [facility], [address], [place], [place] [address]    Report Status 03/03/2021 FINAL  Final      Studies: No results found.  Scheduled Meds: . aspirin EC  81 mg Oral Daily  . budesonide (PULMICORT) nebulizer solution  0.25 mg Nebulization BID  . Chlorhexidine Gluconate Cloth  6 each Topical Daily  . cholecalciferol  1,000 Units Oral Daily  . clopidogrel  75 mg Oral Daily  . DULoxetine  30 mg Oral Daily  . ezetimibe  10 mg Oral Daily  . gabapentin  100 mg Oral QID  . heparin  5,000 Units Subcutaneous Q8H  . HYDROmorphone  2 mg Oral Q6H  . insulin aspart  0-15 Units Subcutaneous TID WC  . insulin aspart  0-5 Units Subcutaneous QHS  . ipratropium-albuterol  3 mL Nebulization Q6H  . mouth rinse  15 mL Mouth Rinse BID  . midodrine  5 mg Oral TID WC  . mupirocin ointment  1 application Nasal BID  . pantoprazole (PROTONIX) IV  40 mg Intravenous QHS  . potassium & sodium phosphates  1 packet Oral Q8H  . simvastatin  40 mg Oral Daily  . sodium chloride flush  10-40 mL Intracatheter Q12H    Continuous Infusions: . magnesium sulfate bolus IVPB    . piperacillin-tazobactam (ZOSYN)  IV 3.375 g (03/03/21 0617)  . vancomycin 1,250 mg (03/02/21 2146)     LOS: 5 days     Kayleen Memos,  MD [facility] Pager [phone]  If 7PM-7AM, please contact night-coverage [email or web address] Password [facility] 03/03/2021, 10:47 AM

## 2021-03-03 NOTE — Progress Notes (Signed)
Patient up in chair for dinner. Ate >50% of meals today. Good PO fluid intake. NO s/sx of aspiration noted today. Strong productive cough noted with tan secretions. Patient reports he is continuing to do his pulmonary exercises. Family at bedside >6 hours today.   Foley removed per Nursing driving protocol. NO clinical indications to remain in place. Foley care and good peri care performed prior and post removal. Patient Due to void 1130 pm.   Patient was complaining of left forearm/wrist pain today, MD notified and allopurinol restarted.   Family updated on plan of care. Will continue to monitor.

## 2021-03-03 NOTE — Progress Notes (Signed)
Pharmacy Antibiotic Note  Assessment:  Christian Fields is a 65 y.o. male admitted on 02/26/2021 with pneumonia (CAP). Patient presented with SOB x 2 days. Has a PMH of left upper lobe lung cancer and COPD on chronic 2L [place]. Patient was admitted with AKI that is improving. Pharmacy was consulted for vancomycin and Zosyn dosing.    3/16: CT chest noted nodules in left upper lobe. 3/22: CT abdomen showed new bilateral lobe airspace disease suspicious for pneumonia. 3/23: CXR: bibasilar infiltrates/edema 3/25 CXR: pulmonary edema  Plan:  1) adjust vancomycin dose to 1500 mg IV every 24 hours  Goal AUC 400-550  Expected AUC: 456  SCr used: 1.16  T1/2: 12.3 h, Ke: 0.056 h-1  Daily SCr while on IV vancomycin to assess renal function  2) Continue zosyn 3.375g q8h   Height: 5\' 9"  (175.3 cm) Weight: 81.1 kg (178 lb 12.7 oz) IBW/kg (Calculated) : 70.7  Temp (24hrs), Avg:98.3 F (36.8 C), Min:98 F (36.7 C), Max:98.8 F (37.1 C)  Recent Labs  Lab 02/27/21 0319 02/27/21 0813 02/27/21 1500 02/27/21 1819 02/28/21 0441 02/28/21 1034 02/28/21 1333 03/01/21 0528 03/02/21 0444 03/02/21 0830 03/03/21 0428  WBC 24.8*  --   --   --  23.4*  --   --  23.9* 29.7*  --  24.5*  CREATININE 2.78*  --   --   --  1.68*  --   --  1.46* 1.23  --  1.16  LATICACIDVEN  --    < > 6.5* 4.4*  --  1.6 1.3  --   --  1.4  --    < > = values in this interval not displayed.    Estimated Creatinine Clearance: 62.6 mL/min (by C-G formula based on SCr of 1.16 mg/dL).    Allergies  Allergen Reactions  . Bee Venom Anaphylaxis    Antimicrobials this admission:  vancomycin 3/21>>  Zosyn 3/22>>  Microbiology results:  03/26 RCx: pending 03/21 BCx: NG final 03/22 MRSA PCR: positive  Thank you for allowing pharmacy to be a part of this patient's care.  Dallie Piles, PharmD 03/03/2021 12:57 PM

## 2021-03-03 NOTE — Evaluation (Signed)
Physical Therapy Evaluation Patient Details Name: Christian Fields MRN: [id number] DOB: 1955-12-17 Today's Date: 03/03/2021   History of Present Illness  65 y.o. male admitted on 02/26/2021 with pneumonia (CAP). Patient presented with SOB x 2 days. Has a PMH of left upper lobe lung cancer and COPD on chronic 2L [place]. Patient was admitted with AKI that is improving. Pharmacy was consulted for vancomycin and Zosyn dosing.  Clinical Impression  Pt seen for PT evaluation with pt irritable but agreeable. Pt is able to complete supine>sit with min assist but requires max assist to scoot to EOB to place feet flat on floor, CGA sit>supine. Pt is able to stand & take side steps to R with mod assist but strong posterior lean onto bed during transfer & poor safety awareness re: use of RW despite PT attempts to educate him. Pt declines exercises at this time. Pt observed to have L lateral lean in sitting & standing & when asked, reports he is aware of it but makes not attempts to correct midline orientation. Pt left sitting up in bed in care of SLP while consuming liquids. Pt would benefit from STR upon d/c to maximize independence with functional mobility & reduce fall risk prior to return home; educated pt on STR recommendation & pt without response.      Follow Up Recommendations SNF;Supervision/Assistance - 24 hour    Equipment Recommendations  None recommended by PT (TBD in next venue)    Recommendations for Other Services       Precautions / Restrictions Precautions Precautions: Fall Restrictions Weight Bearing Restrictions: No      Mobility  Bed Mobility Overal bed mobility: Needs Assistance Bed Mobility: Supine to Sit;Sit to Supine     Supine to sit: Min assist;HOB elevated Sit to supine: Min guard;HOB elevated   General bed mobility comments: cuing to use bed rails to upright trunk vs pulling on PT, max assist to scoot to EOB to place BLE feet on floor    Transfers Overall transfer  level: Needs assistance Equipment used: Rolling walker (2 wheeled) Transfers: Sit to/from Stand Sit to Stand: Mod assist Stand pivot transfers: Mod assist       General transfer comment: Max cuing but poor return demo from hand placement as pt continues to push down/pull on RW with BUE. Strong posterior lean onto EOB.  Ambulation/Gait Ambulation/Gait assistance: Mod assist Gait Distance (Feet): 3 Feet Assistive device: Rolling walker (2 wheeled) Gait Pattern/deviations: Decreased step length - right;Decreased step length - left     General Gait Details: side steps to R at EOB, mod assist for balance  Stairs            Wheelchair Mobility    Modified Rankin (Stroke Patients Only)       Balance Overall balance assessment: Needs assistance Sitting-balance support: Bilateral upper extremity supported Sitting balance-Leahy Scale: Fair Sitting balance - Comments: close supervision sitting EOB, L lateral lean   Standing balance support: During functional activity;Bilateral upper extremity supported Standing balance-Leahy Scale: Poor, L lateral lean Standing balance comment: mod assist, BUE reliance on RW for increased support/balance                             Pertinent Vitals/Pain Pain Assessment: 0-10 Pain Score: 6  Faces Pain Scale: Hurts even more Pain Location: L wrist (pt feels like it's gout pain) Pain Descriptors / Indicators: Aching;Discomfort;Grimacing;Guarding Pain Intervention(s): Monitored during session;Limited activity within patient's tolerance (  notifed nurse)    Home Living Family/patient expects to be discharged to:: Private residence Living Arrangements: Non-relatives/Friends (lives with son's girlfriend, daughter lives nearby & assists as able) Available Help at Discharge: Family;Available PRN/intermittently Type of Home: Mobile home Home Access: Stairs to enter Entrance Stairs-Rails: Right;Left Entrance Stairs-Number of Steps:  5-6 Home Layout: One level Home Equipment: Walker - 2 wheels;Shower seat;Cane - single point Additional Comments: Pt reports he does not use any AD at home but in previous evaluations he has reported using RW when having a "bad day"    Prior Function Level of Independence: Independent with assistive device(s)         Comments: Pt reports independence without AD, denies falls in the past 6 months. On 2L/min via nasal cannula at home. His son is currently incarcerated & son's girlfriend lives with pt but does not assist him, also has a daughter nearby that assists as able.     Hand Dominance   Dominant Hand: Right    Extremity/Trunk Assessment   Upper Extremity Assessment Upper Extremity Assessment: Generalized weakness LUE Deficits / Details: L wrist pain, pt feels as though it's 2/2 gout LUE: Unable to fully assess due to pain    Lower Extremity Assessment Lower Extremity Assessment: Generalized weakness       Communication   Communication: No difficulties  Cognition Arousal/Alertness: Awake/alert Behavior During Therapy: WFL for tasks assessed/performed Overall Cognitive Status: Within Functional Limits for tasks assessed                                 General Comments: Agreeable to tx, somewhat irritable.      General Comments General comments (skin integrity, edema, etc.): Pt on 7L/min via nasal cannula, SpO2 89% or > during session, pt with c/o SOB when asked on one occasion but quickly recovers to 90% or >, reviewed use of acapella flutter valve, educated pt on need to not use straws to consume liquids (per diet paper hanging in room)    Exercises     Assessment/Plan    PT Assessment Patient needs continued PT services  PT Problem List Decreased strength;Decreased safety awareness;Decreased mobility;Cardiopulmonary status limiting activity;Decreased activity tolerance;Decreased balance;Decreased knowledge of use of DME;Pain       PT Treatment  Interventions DME instruction;Therapeutic activities;Modalities;Cognitive remediation;Gait training;Therapeutic exercise;Patient/family education;Stair training;Balance training;Manual techniques;Functional mobility training;Neuromuscular re-education    PT Goals (Current goals can be found in the Care Plan section)  Acute Rehab PT Goals Patient Stated Goal: to go home PT Goal Formulation: With patient Time For Goal Achievement: 03/17/21 Potential to Achieve Goals: Fair    Frequency Min 2X/week   Barriers to discharge Inaccessible home environment;Decreased caregiver support steps to enter, no one to provide consistent assistance at home    Co-evaluation               AM-PAC PT "6 Clicks" Mobility  Outcome Measure Help needed turning from your back to your side while in a flat bed without using bedrails?: A Little Help needed moving from lying on your back to sitting on the side of a flat bed without using bedrails?: A Lot Help needed moving to and from a bed to a chair (including a wheelchair)?: A Lot Help needed standing up from a chair using your arms (e.g., wheelchair or bedside chair)?: A Lot Help needed to walk in hospital room?: A Lot Help needed climbing 3-5 steps with a railing? :  Total 6 Click Score: 12    End of Session Equipment Utilized During Treatment: Gait belt;Oxygen   Patient left: in bed;with call bell/phone within reach;with bed alarm set (in care of SLP) Nurse Communication: Mobility status (L wrist pain) PT Visit Diagnosis: Unsteadiness on feet (R26.81);Difficulty in walking, not elsewhere classified (R26.2);Muscle weakness (generalized) (M62.81)    Time: 8675-4492 PT Time Calculation (min) (ACUTE ONLY): 15 min   Charges:   PT Evaluation $PT Eval Moderate Complexity: Hubbell, PT, DPT 03/03/21, 3:22 PM   Waunita Schooner 03/03/2021, 3:20 PM

## 2021-03-04 LAB — GLUCOSE, CAPILLARY
Glucose-Capillary: 114 mg/dL — ABNORMAL HIGH (ref 70–99)
Glucose-Capillary: 160 mg/dL — ABNORMAL HIGH (ref 70–99)
Glucose-Capillary: 125 mg/dL — ABNORMAL HIGH (ref 70–99)
Glucose-Capillary: 163 mg/dL — ABNORMAL HIGH (ref 70–99)

## 2021-03-04 LAB — CBC WITH DIFFERENTIAL/PLATELET
Abs Immature Granulocytes: 0.28 10*3/uL — ABNORMAL HIGH (ref 0.00–0.07)
Basophils Absolute: 0 10*3/uL (ref 0.0–0.1)
Basophils Relative: 0 %
Eosinophils Absolute: 0.4 10*3/uL (ref 0.0–0.5)
Eosinophils Relative: 3 %
HCT: 29.4 % — ABNORMAL LOW (ref 39.0–52.0)
Hemoglobin: 9.6 g/dL — ABNORMAL LOW (ref 13.0–17.0)
Immature Granulocytes: 2 %
Lymphocytes Relative: 10 %
Lymphs Abs: 1.3 10*3/uL (ref 0.7–4.0)
MCH: 30.1 pg (ref 26.0–34.0)
MCHC: 32.7 g/dL (ref 30.0–36.0)
MCV: 92.2 fL (ref 80.0–100.0)
Monocytes Absolute: 0.9 10*3/uL (ref 0.1–1.0)
Monocytes Relative: 6 %
Neutro Abs: 10.7 10*3/uL — ABNORMAL HIGH (ref 1.7–7.7)
Neutrophils Relative %: 79 %
Platelets: 164 10*3/uL (ref 150–400)
RBC: 3.19 MIL/uL — ABNORMAL LOW (ref 4.22–5.81)
RDW: 15.4 % (ref 11.5–15.5)
WBC: 13.6 10*3/uL — ABNORMAL HIGH (ref 4.0–10.5)
nRBC: 0 % (ref 0.0–0.2)

## 2021-03-04 LAB — BASIC METABOLIC PANEL
Anion gap: 11 (ref 5–15)
BUN: 20 mg/dL (ref 8–23)
CO2: 28 mmol/L (ref 22–32)
Calcium: 8.2 mg/dL — ABNORMAL LOW (ref 8.9–10.3)
Chloride: 93 mmol/L — ABNORMAL LOW (ref 98–111)
Creatinine, Ser: 1.06 mg/dL (ref 0.61–1.24)
GFR, Estimated: >60 mL/min (ref >60)
Glucose, Bld: 144 mg/dL — ABNORMAL HIGH (ref 70–99)
Potassium: 3 mmol/L — ABNORMAL LOW (ref 3.5–5.1)
Sodium: 132 mmol/L — ABNORMAL LOW (ref 135–145)

## 2021-03-04 LAB — PHOSPHORUS: Phosphorus: 2.4 mg/dL — ABNORMAL LOW (ref 2.5–4.6)

## 2021-03-04 LAB — MAGNESIUM: Magnesium: 1.9 mg/dL (ref 1.7–2.4)

## 2021-03-04 MED ORDER — POTASSIUM & SODIUM PHOSPHATES 280-160-250 MG PO PACK
1.0000 | PACK | Freq: Once | ORAL | Status: AC
  Administered 2021-03-04: 1 via ORAL
  Filled 2021-03-04: qty 1

## 2021-03-04 MED ORDER — POTASSIUM CHLORIDE CRYS ER 20 MEQ PO TBCR
40.0000 meq | EXTENDED_RELEASE_TABLET | Freq: Two times a day (BID) | ORAL | Status: AC
  Administered 2021-03-04 (×2): 40 meq via ORAL
  Filled 2021-03-04 (×2): qty 2

## 2021-03-04 NOTE — Progress Notes (Signed)
   03/04/21 1326  What Happened  Was fall witnessed? No  Was patient injured? No  Patient found on floor  Found by Staff-comment Nevin Bloodgood EVS)  Stated prior activity to/from bed, chair, or stretcher  Follow Up  MD notified Irene Pap  Time MD notified [phone]  Family notified No - patient refusal  Additional tests No  Progress note created (see row info) Yes  Adult Fall Risk Assessment  Risk Factor Category (scoring not indicated) Fall has occurred during this admission (document High fall risk)  Patient Fall Risk Level High fall risk  Adult Fall Risk Interventions  Required Bundle Interventions *See Row Information* High fall risk - low, moderate, and high requirements implemented  Additional Interventions Use of appropriate toileting equipment (bedpan, BSC, etc.);Room near nurses station;PT/OT need assessed if change in mobility from baseline  Screening for Fall Injury Risk (To be completed on HIGH fall risk patients) - Assessing Need for Floor Mats  Risk For Fall Injury- Criteria for Floor Mats Previous fall this admission  Will Implement Floor Mats Yes  Vitals  Temp 98.2 F (36.8 C)  BP (!) 164/86  MAP (mmHg) 104  BP Location Left Arm  BP Method Automatic  Patient Position (if appropriate) Sitting  Pulse Rate (!) 105  Pulse Rate Source Monitor  Resp 20  Oxygen Therapy  SpO2 92 %  O2 Device Nasal Cannula  O2 Flow Rate (L/min) 6 L/min  Pain Assessment  Pain Scale 0-10  Pain Score 0  PCA/Epidural/Spinal Assessment  Respiratory Pattern Regular;Unlabored  Neurological  Neuro (WDL) WDL  Glasgow Coma Scale  Eye Opening 4  Best Verbal Response (NON-intubated) 5  Best Motor Response 6  Glasgow Coma Scale Score 15  Musculoskeletal  Musculoskeletal (WDL) X  Assistive Device BSC;Front wheel walker  Generalized Weakness Yes  Weight Bearing Restrictions No  Integumentary  Integumentary (WDL) X  Skin Color Appropriate for ethnicity  Skin Condition Dry  Skin Integrity  Abrasion;Cracking;Ecchymosis  Abrasion Location Leg  Abrasion Location Orientation Bilateral  Cracking Location Foot  Cracking Location Orientation Bilateral  Ecchymosis Location Other (Comment) (scattered)  Ecchymosis Location Orientation Bilateral  Skin Turgor Non-tenting  Pain Assessment  Work-Related Injury No

## 2021-03-04 NOTE — Progress Notes (Signed)
PROGRESS NOTE  Christian Fields CBJ:[id number] DOB: [birth year] DOA: 02/26/2021 PCP: Remi Haggard, FNP  HPI/Recap of past 53 hours: 65 year old male with known COPD on chronic 2 L nasal cannula at home and prior left upper lobe lung cancer status post radiation presents to the ED from home via EMS in acute hypoxic respiratory failure ultimately requiring BiPAP and vasopressors for septic shock. Per ED documentation, upon EMS arrival they were unable to obtain SPO2 readings.  Patient was placed on a nonrebreather for oxygen support with SPO2 readings in the high 80's.  He also received 2 duo nebs in route with EMS. ED course: Initial vitals-99.1, tachypneic at 25, tachycardic at 138, BP 130/116, SPO2 80% on nonrebreather at 15 L.  Patient was worked up for sepsis on arrival, receiving 2.5 liters with azithromycin and cefepime for CAP coverage.  Patient ultimately requiring BiPAP & vasopressor support. Significant labs: Procalcitonin elevated at 99.28, BNP elevated at 1149.5, lactic acidosis 7.4, hypokalemia at 2.9 & AKI: BUN 35/creatinine 3.05 elevated from 1.05 in February 2022. PCCM consulted for admission due to vasopressor administration & BiPAP support.  Pertinent  Medical History  COPD on chronic 2 L nasal cannula Type 2 diabetes mellitus Chronic back pain on Suboxone Hypertension Hyperlipidemia Left upper lobe cancer status post radiation Polysubstance abuse AAA  Significant Hospital Events: Including procedures, antibiotic start and stop dates in addition to other pertinent events    02/26/2021 -admit to ICU due to septic shock requiring vasopressor administration and BiPAP support.  02/27/2021- Left femoral CVC placed due to high dose pressors. Tolerating BiPAP, VBG improving. ABX changed to Vancomycin & Zosyn due to worsening lactic acidosis  02/28/2021- Fluids stopped overnight due to "sounding wet", CXR without interim change.  Vasopressin weaned down, decreasing Levophed  requirements, lactic acidosis resolved  Cultures:  3/21: SARS-CoV-2 PCR>>negative 3/21: Influenza PCR>>negative 3/21: Blood culture x2>> 3/22: Strep pneumo urinary antigen>>negative 3/22: Legionella urinary antigen>> 3/22: Mycoplasma Pneumoniae antibody>> 3/22: MRSA PCR>>positive 3/23: Sputum>>  Antimicrobials:  Azithromycin 3/21>>3/22 Cefepime 3/21>>3/22 Vancomycin 3/21 x1 dose, 3/22>> Zosyn 3/22>>  Speech therapist consulted on 03/02/2021, swallow evaluation revealed mild aspiration risk with recommendation for mechanical soft diet with meat minced with gravy, thin liquids via cup, 100% supervision and support feeding at meals.  Home meds with pure versus need for crushed  [name] assumed care on 03/02/2021.  Subjective: 03/04/21: Patient was seen and examined at his bedside this morning.  He denies having any chest pain.  His breathing is improved.  He wore his BiPAP last night for couple of hours.  Assessment/Plan: Active Problems:   Acute respiratory failure with hypoxia (HCC)   Septic shock (HCC)  Septic shock, shock has resolved, he is no longer on vasopressor, secondary to right lower lobe pneumonia. Currently off IV vasopressors, however he is on midodrine 5 mg 3 times daily. Maintaining his MAP greater than 65. Continue IV vancomycin and Zosyn. MRSA screening test positive on 02/27/2021 Sputum culture, few yeast. Procalcitonin downtrending 25 from greater than 150 on 02/28/2021. WBC downtrending, 13 from 24 from 29 Monitor fever curve and WBC. Continue current IV antibiotics regimen.  Acute hypoxic hypercarbic respiratory failure secondary to right lower lobe pneumonia and pulmonary edema. Not on oxygen supplementation at baseline Maintain O2 saturation between 88 and 94% as recommended by pulmonary. Wean off oxygen supplementation as tolerated. Currently on 6 L with O2 saturation 92%. Continue BiPAP at night.  Acute on chronic diastolic CHF Last 2D echo done on  02/28/2021 showed  normal LVEF 55 to 60% with grade 2 diastolic dysfunction. BNP uprising greater than 2400 from 1100 on 02/26/2021 Start strict I's and O's and daily weights Off diuretics due to recent septic shock requiring vasopressors Fluid restriction.  Resolved nonoliguric AKI Baseline creatinine appears to be 1.0 GFR greater than 60 Presented with creatinine of 3.05 with GFR 22. Creatinine 1.1 with GFR greater than 60. Continue to avoid nephrotoxic agents Continue to monitor urine output Last urine output 3.1 L recorded in the last 24 hours. Repeat renal panel in the morning.  Dysphagia with mild risk for aspiration. Currently on dysphagia 3 diet Appreciate speech therapy's assessment and assistance. Continue aspiration precautions.  GERD Reported burning pain after eating his breakfast this morning GI cocktail ordered Continue p.o. Protonix 40 mg daily.  History of lacunar CVA Head CT done on 01/29/2021, ordered after unwitnessed fall revealed: Small chronic bilateral basal ganglia lacunar infarcts. States he has been dropping things unintentionally from his hands. Continue aspirin Plavix and Zetia.  Atypical chest pain, reproducible on palpation. Troponin elevated, peaked at 56 and trended down. Continue to monitor on telemetry.  Refractory hypokalemia Serum potassium 3.0> 4.1>3.0 Repleted orally Recheck  Hypomagnesemia Serum magnesium 1.1> 1.9 Repleted intravenously Repeat level in the morning.  Improving post repletion: Hypophosphatemia Serum phosphorus 1.4> 1.6> 2.4 Repleted orally. Repeat level in the morning  Chronic back pain on Suboxone PTA Normally on Suboxone at home as well as gabapentin. Reduce home dose of gabapentin, he was taking 800 mg 4 times a day. UDS positive for benzodiazepine and opiates.   TOC will provide resources prior to DC.  Essential hypertension, BPs are currently soft. Recently off vasopressors due to septic shock.  Continue  to hold off home oral antihypertensives. He is currently on midodrine 5 mg 3 times daily to maintain MAP greater than 65, continue. Restart beta-blocker to avoid beta-blockade withdrawal. He was on Toprol-XL 50 mg daily, resume at 12.5 mg daily and adjust doses as needed.  Impaired glucose tolerance He is on insulin sliding scale. Hemoglobin A1c 5.5 on 01/30/2021 Avoid hypoglycemia.  Physical debility PT OT recommending SNF TOC consulted to assist with SNF placement. Fall precautions.    Code Status: Full code.  Family Communication: Updated his daughter via phone  Disposition Plan: Likely will discharge to home once hemodynamically stable and on oxygen requirement has improved..   Consultants:  Pulmonary  Procedures:  None.  Antimicrobials:  IV vancomycin and Zosyn.  DVT prophylaxis: SQ heparin 3 times daily.  Status is: Inpatient    Dispo:  Patient From: Home  Planned Disposition: Home with home health services or SNF if the patient is agreeable.  Medically stable for discharge: No, ongoing management of sepsis secondary to right lower lobe pneumonia.  Shock has resolved, now off vasopressors.          Objective: Vitals:   03/04/21 0840 03/04/21 0900 03/04/21 1000 03/04/21 1010  BP:  100/67 (!) 116/96 (!) 116/96  Pulse: 99 99 95 97  Resp: (!) 28 (!) 26 (!) 25   Temp:      TempSrc:      SpO2: 90% 92% 91%   Weight:      Height:        Intake/Output Summary (Last 24 hours) at 03/04/2021 1107 Last data filed at 03/04/2021 1000 Gross per 24 hour  Intake 1121.44 ml  Output 700 ml  Net 421.44 ml   Filed Weights   03/01/21 0500 03/03/21 0450 03/04/21 0500  Weight:  83.2 kg 81.1 kg 81.2 kg    Exam:  . General: 65 y.o. year-old male chronically ill-appearing no acute distress.  He is alert and oriented x3.   . Cardiovascular: Regular rate and rhythm no rubs or gallops.   [name] Kitchen Respiratory: Mild rales at bases no wheezing noted. . Abdomen: Soft  Nontender Normal Bowel Sounds Present.   . Musculoskeletal: No lower extremity edema bilaterally. . Skin: No ulcerative lesions noted. [name] Kitchen Psychiatry: Mood is appropriate for condition and setting.   Data Reviewed: CBC: Recent Labs  Lab 02/28/21 0441 03/01/21 0528 03/02/21 0444 03/03/21 0428 03/04/21 0005  WBC 23.4* 23.9* 29.7* 24.5* 13.6*  NEUTROABS 17.1* 22.7* 27.9* 21.2* 10.7*  HGB 9.4* 8.7* 9.6* 10.2* 9.6*  HCT 27.7* 26.5* 28.9* 32.4* 29.4*  MCV 91.4 93.0 91.7 94.2 92.2  PLT 148* 128* 145* 170 229   Basic Metabolic Panel: Recent Labs  Lab 02/28/21 0441 03/01/21 0528 03/01/21 1554 03/02/21 0444 03/02/21 1354 03/03/21 0428 03/03/21 1602 03/04/21 0005  NA 137 139  --  140  --  141  --  132*  K 3.4* 2.6* 3.0* 3.0* 3.1* 4.1  --  3.0*  CL 103 102  --  105  --  100  --  93*  CO2 25 24  --  26  --  28  --  28  GLUCOSE 156* 95  --  121*  --  79  --  144*  BUN 39* 39*  --  34*  --  25*  --  20  CREATININE 1.68* 1.46*  --  1.23  --  1.16  --  1.06  CALCIUM 7.5* 7.8*  --  8.4*  --  8.5*  --  8.2*  MG 1.7 2.1  --  1.7  --  1.1* 2.4 1.9  PHOS 2.8 2.5  --  1.4*  --  1.6* 1.3* 2.4*   GFR: Estimated Creatinine Clearance: 68.6 mL/min (by C-G formula based on SCr of 1.06 mg/dL). Liver Function Tests: Recent Labs  Lab 02/26/21 1915  AST 27  ALT 11  ALKPHOS 59  BILITOT 0.5  PROT 6.8  ALBUMIN 3.5   No results for input(s): LIPASE, AMYLASE in the last 168 hours. No results for input(s): AMMONIA in the last 168 hours. Coagulation Profile: No results for input(s): INR, PROTIME in the last 168 hours. Cardiac Enzymes: No results for input(s): CKTOTAL, CKMB, CKMBINDEX, TROPONINI in the last 168 hours. BNP (last 3 results) No results for input(s): PROBNP in the last 8760 hours. HbA1C: No results for input(s): HGBA1C in the last 72 hours. CBG: Recent Labs  Lab 03/03/21 0754 03/03/21 1117 03/03/21 1600 03/03/21 2115 03/04/21 0736  GLUCAP 97 240* 156* 164* 114*   Lipid  Profile: No results for input(s): CHOL, HDL, LDLCALC, TRIG, CHOLHDL, LDLDIRECT in the last 72 hours. Thyroid Function Tests: No results for input(s): TSH, T4TOTAL, FREET4, T3FREE, THYROIDAB in the last 72 hours. Anemia Panel: No results for input(s): VITAMINB12, FOLATE, FERRITIN, TIBC, IRON, RETICCTPCT in the last 72 hours. Urine analysis:    Component Value Date/Time   COLORURINE YELLOW (A) 02/27/2021 0330   APPEARANCEUR HAZY (A) 02/27/2021 0330   APPEARANCEUR Clear 03/24/2013 1853   LABSPEC 1.017 02/27/2021 0330   LABSPEC 1.020 03/24/2013 1853   PHURINE 5.0 02/27/2021 0330   GLUCOSEU 150 (A) 02/27/2021 0330   GLUCOSEU Negative 03/24/2013 1853   HGBUR LARGE (A) 02/27/2021 0330   BILIRUBINUR NEGATIVE 02/27/2021 0330   BILIRUBINUR Negative 03/24/2013 1853   KETONESUR  NEGATIVE 02/27/2021 0330   PROTEINUR 30 (A) 02/27/2021 0330   NITRITE NEGATIVE 02/27/2021 0330   LEUKOCYTESUR NEGATIVE 02/27/2021 0330   LEUKOCYTESUR Negative 03/24/2013 1853   Sepsis Labs: @LABRCNTIP (procalcitonin:4,lacticidven:4)  ) Recent Results (from the past 240 hour(s))  Resp Panel by RT-PCR (Flu A&B, Covid) Nasopharyngeal Swab     Status: None   Collection Time: 02/26/21  7:11 PM   Specimen: Nasopharyngeal Swab; Nasopharyngeal(NP) swabs in vial transport medium  Result Value Ref Range Status   SARS Coronavirus 2 by RT PCR NEGATIVE NEGATIVE Final    Comment: (NOTE) SARS-CoV-2 target nucleic acids are NOT DETECTED.  The SARS-CoV-2 RNA is generally detectable in upper respiratory specimens during the acute phase of infection. The lowest concentration of SARS-CoV-2 viral copies this assay can detect is 138 copies/mL. A negative result does not preclude SARS-Cov-2 infection and should not be used as the sole basis for treatment or other patient management decisions. A negative result may occur with  improper specimen collection/handling, submission of specimen other than nasopharyngeal swab, presence of  viral mutation(s) within the areas targeted by this assay, and inadequate number of viral copies(<138 copies/mL). A negative result must be combined with clinical observations, patient history, and epidemiological information. The expected result is Negative.  Fact Sheet for Patients:  EntrepreneurPulse.com.au  Fact Sheet for Healthcare Providers:  IncredibleEmployment.be  This test is no t yet approved or cleared by the [place] FDA and  has been authorized for detection and/or diagnosis of SARS-CoV-2 by FDA under an Emergency Use Authorization (EUA). This EUA will remain  in effect (meaning this test can be used) for the duration of the COVID-19 declaration under Section 564(b)(1) of the Act, 21 U.S.C.section 360bbb-3(b)(1), unless the authorization is terminated  or revoked sooner.       Influenza A by PCR NEGATIVE NEGATIVE Final   Influenza B by PCR NEGATIVE NEGATIVE Final    Comment: (NOTE) The Xpert Xpress SARS-CoV-2/FLU/RSV plus assay is intended as an aid in the diagnosis of influenza from Nasopharyngeal swab specimens and should not be used as a sole basis for treatment. Nasal washings and aspirates are unacceptable for Xpert Xpress SARS-CoV-2/FLU/RSV testing.  Fact Sheet for Patients: EntrepreneurPulse.com.au  Fact Sheet for Healthcare Providers: IncredibleEmployment.be  This test is not yet approved or cleared by the [place] FDA and has been authorized for detection and/or diagnosis of SARS-CoV-2 by FDA under an Emergency Use Authorization (EUA). This EUA will remain in effect (meaning this test can be used) for the duration of the COVID-19 declaration under Section 564(b)(1) of the Act, 21 U.S.C. section 360bbb-3(b)(1), unless the authorization is terminated or revoked.  Performed at [facility], [address] [place], [place] [address]   Blood culture (routine x  2)     Status: None   Collection Time: 02/26/21  7:11 PM   Specimen: BLOOD  Result Value Ref Range Status   Specimen Description BLOOD BLOOD RIGHT ARM  Final   Special Requests   Final    BOTTLES DRAWN AEROBIC AND ANAEROBIC Blood Culture adequate volume   Culture   Final    NO GROWTH 5 DAYS Performed at [facility], [address], [place], [place] [address]    Report Status 03/03/2021 FINAL  Final  Blood culture (routine x 2)     Status: None   Collection Time: 02/26/21  7:11 PM   Specimen: BLOOD  Result Value Ref Range Status   Specimen Description BLOOD BLOOD LEFT ARM  Final   Special Requests   Final    BOTTLES DRAWN AEROBIC AND ANAEROBIC Blood Culture adequate volume   Culture   Final    NO GROWTH 5 DAYS Performed at [facility], [address] [place], [place] [address]    Report Status 03/03/2021 FINAL  Final  MRSA PCR Screening     Status: Abnormal   Collection Time: 02/27/21 10:58 PM   Specimen: Nasopharyngeal  Result Value Ref Range Status   MRSA by PCR POSITIVE (A) NEGATIVE Final    Comment:        The GeneXpert MRSA Assay (FDA approved for NASAL specimens only), is one component of a comprehensive MRSA colonization surveillance program. It is not intended to diagnose MRSA infection nor to guide or monitor treatment for MRSA infections. RESULT CALLED TO, READ BACK BY AND VERIFIED WITH: CICI VANN'BRAY @0047  ON 02/28/21 SKL Performed at [facility], [place], [place] [address]   Expectorated Sputum Assessment w Gram Stain, Rflx to Resp Cult     Status: None   Collection Time: 02/28/21  5:32 PM   Specimen: Expectorated Sputum  Result Value Ref Range Status   Specimen Description EXPECTORATED SPUTUM  Final   Special Requests NONE  Final   Sputum evaluation   Final    Sputum specimen not acceptable for testing.  Please recollect.   RESULT CALLED TO, READ BACK BY AND VERIFIED WITH: CHARLIE FOEEPWOOD AT [place] ON  02/28/21 BY SS Performed at [facility], [address] [place], [place] [address]    Report Status 02/28/2021 FINAL  Final  Expectorated Sputum Assessment w Gram Stain, Rflx to Resp Cult     Status: None   Collection Time: 03/03/21  6:46 AM   Specimen: Sputum  Result Value Ref Range Status   Specimen Description SPUTUM  Final   Special Requests NONE  Final   Sputum evaluation   Final    THIS SPECIMEN IS ACCEPTABLE FOR SPUTUM CULTURE Performed at [facility], [address] [place], [place] [address]    Report Status 03/03/2021 FINAL  Final  Culture, Respiratory w Gram Stain     Status: None (Preliminary result)   Collection Time: 03/03/21  6:46 AM   Specimen: SPU  Result Value Ref Range Status   Specimen Description   Final    SPUTUM Performed at [facility], [address], [place], [place] [address]    Special Requests   Final    NONE Reflexed from [id number] Performed at [facility], [place], [place] [address]    Gram Stain FEW WBC PRESENT, PREDOMINANTLY PMN FEW YEAST   Final   Culture   Final    CULTURE REINCUBATED FOR BETTER GROWTH Performed at [facility], [place] [address] [place], [place] [address]    Report Status PENDING  Incomplete      Studies: No results found.  Scheduled Meds: . allopurinol  300 mg Oral BID  . aspirin EC  81 mg Oral Daily  . budesonide (PULMICORT) nebulizer solution  0.25 mg Nebulization BID  . Chlorhexidine Gluconate Cloth  6 each Topical Daily  . cholecalciferol  1,000 Units Oral Daily  . clopidogrel  75 mg Oral Daily  . DULoxetine  30 mg Oral Daily  . ezetimibe  10 mg Oral Daily  . gabapentin  100 mg Oral QID  . heparin  5,000 Units Subcutaneous Q8H  . HYDROmorphone  2 mg Oral  Q6H  . insulin aspart  0-15 Units Subcutaneous TID WC  . insulin aspart  0-5 Units Subcutaneous QHS  . ipratropium-albuterol  3 mL Nebulization Q6H  . mouth rinse  15 mL Mouth Rinse BID  .  metoprolol succinate  12.5 mg Oral Daily  . midodrine  5 mg Oral TID WC  . pantoprazole  40 mg Oral Daily  . potassium chloride  40 mEq Oral BID  . simvastatin  40 mg Oral Daily  . sodium chloride flush  10-40 mL Intracatheter Q12H    Continuous Infusions: . piperacillin-tazobactam (ZOSYN)  IV 12.5 mL/hr at 03/04/21 1000  . vancomycin Stopped (03/03/21 2124)     LOS: 6 days     Kayleen Memos, MD [facility] Pager [phone]  If 7PM-7AM, please contact night-coverage [email or web address] Password [facility] 03/04/2021, 11:07 AM

## 2021-03-04 NOTE — Progress Notes (Signed)
[place] for Electrolyte Monitoring and Replacement   Recent Labs: Potassium (mmol/L)  Date Value  03/04/2021 3.0 (L)  05/17/2014 3.5   Magnesium (mg/dL)  Date Value  03/04/2021 1.9   Calcium (mg/dL)  Date Value  03/04/2021 8.2 (L)   Calcium, Total (mg/dL)  Date Value  03/31/2014 9.3   Albumin (g/dL)  Date Value  02/26/2021 3.5  09/24/2013 3.6   Phosphorus (mg/dL)  Date Value  03/04/2021 2.4 (L)   Sodium (mmol/L)  Date Value  03/04/2021 132 (L)  03/31/2014 136   Corrected Ca: 8.9 mg/dL  Assessment: 65 yo M presented with shortness of breath concerning for CAP. PMH includes COPD, diabetes, L upper lobe lung cancer s/p radiation, chronic back pain. Pharmacy consulted to assist in electrolyte management.    Goal of Therapy:  Electrolytes WNL  Plan:  K 3.0  Mag 1.9  Phos 2.4  Na 132 Scr 1.06  NP ordered Phos-Nak 1 packet po x 1 (each packet contains 250 mg elemental phosphorous)   NP ordered KCL 40 meq PO bid x 2 doses  Monitor electrolytes with AM labs  Dajanee Voorheis A, PharmD 03/04/2021 8:54 AM

## 2021-03-04 NOTE — Progress Notes (Signed)
Pharmacy Antibiotic Note  Assessment:  Christian Fields is a 65 y.o. male admitted on 02/26/2021 with pneumonia (CAP). Patient presented with SOB x 2 days. Has a PMH of left upper lobe lung cancer and COPD on chronic 2L [place]. Patient was admitted with AKI that is improving. Pharmacy was consulted for vancomycin and Zosyn dosing.    3/16: CT chest noted nodules in left upper lobe. 3/22: CT abdomen showed new bilateral lobe airspace disease suspicious for pneumonia. 3/23: CXR: bibasilar infiltrates/edema 3/25 CXR: pulmonary edema  Plan:  1)  vancomycin 1500 mg IV every 24 hours  Goal AUC 400-550  Expected AUC: 456  SCr used: 1.16  T1/2: 12.3 h, Ke: 0.056 h-1  Daily SCr while on IV vancomycin to assess renal function  2) Continue zosyn 3.375g q8h   Height: 5\' 9"  (175.3 cm) Weight: 81.2 kg (179 lb 0.2 oz) IBW/kg (Calculated) : 70.7  Temp (24hrs), Avg:98.6 F (37 C), Min:98 F (36.7 C), Max:99.2 F (37.3 C)  Recent Labs  Lab 02/27/21 1500 02/27/21 1819 02/28/21 0441 02/28/21 1034 02/28/21 1333 03/01/21 0528 03/02/21 0444 03/02/21 0830 03/03/21 0428 03/04/21 0005  WBC  --   --  23.4*  --   --  23.9* 29.7*  --  24.5* 13.6*  CREATININE  --   --  1.68*  --   --  1.46* 1.23  --  1.16 1.06  LATICACIDVEN 6.5* 4.4*  --  1.6 1.3  --   --  1.4  --   --     Estimated Creatinine Clearance: 68.6 mL/min (by C-G formula based on SCr of 1.06 mg/dL).    Allergies  Allergen Reactions  . Bee Venom Anaphylaxis    Antimicrobials this admission:  vancomycin 3/21>>  Zosyn 3/22>>  Dose adj: 3/26 1250mg  IV q24h to 1500mg  IV q24h  Microbiology results:  03/26 RCx: pending 03/21 BCx: NG final 03/22 MRSA PCR: positive  Thank you for allowing pharmacy to be a part of this patient's care.  Domenique Quest A, PharmD 03/04/2021 8:59 AM

## 2021-03-05 ENCOUNTER — Ambulatory Visit: Payer: Medicare HMO | Admitting: Radiation Oncology

## 2021-03-05 LAB — BASIC METABOLIC PANEL
Anion gap: 9 (ref 5–15)
BUN: 17 mg/dL (ref 8–23)
CO2: 29 mmol/L (ref 22–32)
Calcium: 8.4 mg/dL — ABNORMAL LOW (ref 8.9–10.3)
Chloride: 97 mmol/L — ABNORMAL LOW (ref 98–111)
Creatinine, Ser: 1.05 mg/dL (ref 0.61–1.24)
GFR, Estimated: >60 mL/min (ref >60)
Glucose, Bld: 105 mg/dL — ABNORMAL HIGH (ref 70–99)
Potassium: 3.4 mmol/L — ABNORMAL LOW (ref 3.5–5.1)
Sodium: 135 mmol/L (ref 135–145)

## 2021-03-05 LAB — CBC WITH DIFFERENTIAL/PLATELET
Abs Immature Granulocytes: 0.26 10*3/uL — ABNORMAL HIGH (ref 0.00–0.07)
Basophils Absolute: 0 10*3/uL (ref 0.0–0.1)
Basophils Relative: 0 %
Eosinophils Absolute: 0.5 10*3/uL (ref 0.0–0.5)
Eosinophils Relative: 3 %
HCT: 28.7 % — ABNORMAL LOW (ref 39.0–52.0)
Hemoglobin: 9.2 g/dL — ABNORMAL LOW (ref 13.0–17.0)
Immature Granulocytes: 1 %
Lymphocytes Relative: 7 %
Lymphs Abs: 1.2 10*3/uL (ref 0.7–4.0)
MCH: 29.8 pg (ref 26.0–34.0)
MCHC: 32.1 g/dL (ref 30.0–36.0)
MCV: 92.9 fL (ref 80.0–100.0)
Monocytes Absolute: 0.8 10*3/uL (ref 0.1–1.0)
Monocytes Relative: 4 %
Neutro Abs: 15.2 10*3/uL — ABNORMAL HIGH (ref 1.7–7.7)
Neutrophils Relative %: 85 %
Platelets: 248 10*3/uL (ref 150–400)
RBC: 3.09 MIL/uL — ABNORMAL LOW (ref 4.22–5.81)
RDW: 15.3 % (ref 11.5–15.5)
WBC: 18 10*3/uL — ABNORMAL HIGH (ref 4.0–10.5)
nRBC: 0 % (ref 0.0–0.2)

## 2021-03-05 LAB — CULTURE, RESPIRATORY W GRAM STAIN: Culture: NORMAL

## 2021-03-05 LAB — GLUCOSE, CAPILLARY
Glucose-Capillary: 118 mg/dL — ABNORMAL HIGH (ref 70–99)
Glucose-Capillary: 171 mg/dL — ABNORMAL HIGH (ref 70–99)
Glucose-Capillary: 131 mg/dL — ABNORMAL HIGH (ref 70–99)
Glucose-Capillary: 121 mg/dL — ABNORMAL HIGH (ref 70–99)

## 2021-03-05 LAB — MAGNESIUM: Magnesium: 1.3 mg/dL — ABNORMAL LOW (ref 1.7–2.4)

## 2021-03-05 LAB — PROCALCITONIN: Procalcitonin: 5.49 ng/mL

## 2021-03-05 LAB — PHOSPHORUS: Phosphorus: 3.8 mg/dL (ref 2.5–4.6)

## 2021-03-05 MED ORDER — MAGNESIUM SULFATE 4 GM/100ML IV SOLN
4.0000 g | Freq: Once | INTRAVENOUS | Status: AC
  Administered 2021-03-05: 4 g via INTRAVENOUS
  Filled 2021-03-05: qty 100

## 2021-03-05 MED ORDER — POTASSIUM CHLORIDE CRYS ER 20 MEQ PO TBCR
40.0000 meq | EXTENDED_RELEASE_TABLET | Freq: Two times a day (BID) | ORAL | Status: AC
  Administered 2021-03-05 (×2): 40 meq via ORAL
  Filled 2021-03-05 (×2): qty 2

## 2021-03-05 MED ORDER — GABAPENTIN 100 MG PO CAPS
200.0000 mg | ORAL_CAPSULE | Freq: Four times a day (QID) | ORAL | Status: DC
  Administered 2021-03-05 – 2021-03-09 (×14): 200 mg via ORAL
  Filled 2021-03-05 (×14): qty 2

## 2021-03-05 MED ORDER — POTASSIUM CHLORIDE CRYS ER 20 MEQ PO TBCR
40.0000 meq | EXTENDED_RELEASE_TABLET | Freq: Once | ORAL | Status: AC
  Administered 2021-03-05: 40 meq via ORAL
  Filled 2021-03-05: qty 2

## 2021-03-05 MED ORDER — MAGNESIUM SULFATE 2 GM/50ML IV SOLN
2.0000 g | INTRAVENOUS | Status: DC
  Administered 2021-03-05 (×2): 2 g via INTRAVENOUS
  Filled 2021-03-05 (×2): qty 50

## 2021-03-05 NOTE — Care Management Important Message (Signed)
Important Message  Patient Details  Name: Christian Fields MRN: [id number] Date of Birth: [birth year]   Medicare Important Message Given:  Yes     [name] 03/05/2021, 2:29 PM

## 2021-03-05 NOTE — Progress Notes (Signed)
PROGRESS NOTE  Christian Fields SWH:[id number] DOB: [birth year] DOA: 02/26/2021 PCP: Remi Haggard, FNP  HPI/Recap of past 80 hours: 65 year old male with known COPD on chronic 2 L nasal cannula at home and prior left upper lobe lung cancer status post radiation presents to the ED from home via EMS in acute hypoxic respiratory failure ultimately requiring BiPAP and vasopressors for septic shock. Per ED documentation, upon EMS arrival they were unable to obtain SPO2 readings.  Patient was placed on a nonrebreather for oxygen support with SPO2 readings in the high 80's.  He also received 2 duo nebs in route with EMS. ED course: Initial vitals-99.1, tachypneic at 25, tachycardic at 138, BP 130/116, SPO2 80% on nonrebreather at 15 L.  Patient was worked up for sepsis on arrival, receiving 2.5 liters with azithromycin and cefepime for CAP coverage.  Patient ultimately requiring BiPAP & vasopressor support. Significant labs: Procalcitonin elevated at 99.28, BNP elevated at 1149.5, lactic acidosis 7.4, hypokalemia at 2.9 & AKI: BUN 35/creatinine 3.05 elevated from 1.05 in February 2022. PCCM consulted for admission due to vasopressor administration & BiPAP support.  Pertinent  Medical History  COPD on chronic 2 L nasal cannula Type 2 diabetes mellitus Chronic back pain on Suboxone Hypertension Hyperlipidemia Left upper lobe cancer status post radiation Polysubstance abuse AAA  Significant Hospital Events: Including procedures, antibiotic start and stop dates in addition to other pertinent events    02/26/2021 -admit to ICU due to septic shock requiring vasopressor administration and BiPAP support.  02/27/2021- Left femoral CVC placed due to high dose pressors. Tolerating BiPAP, VBG improving. ABX changed to Vancomycin & Zosyn due to worsening lactic acidosis  02/28/2021- Fluids stopped overnight due to "sounding wet", CXR without interim change.  Vasopressin weaned down, decreasing Levophed  requirements, lactic acidosis resolved  Cultures:  3/21: SARS-CoV-2 PCR>>negative 3/21: Influenza PCR>>negative 3/21: Blood culture x2>> 3/22: Strep pneumo urinary antigen>>negative 3/22: Legionella urinary antigen>> 3/22: Mycoplasma Pneumoniae antibody>> 3/22: MRSA PCR>>positive 3/23: Sputum>>  Antimicrobials:  Azithromycin 3/21>>3/22 Cefepime 3/21>>3/22 Vancomycin 3/21 x1 dose, 3/22>> Zosyn 3/22>>  Speech therapist consulted on 03/02/2021, swallow evaluation revealed mild aspiration risk with recommendation for mechanical soft diet with meat minced with gravy, thin liquids via cup, 100% supervision and support feeding at meals.  Home meds with pure versus need for crushed  [name] assumed care on 03/02/2021.  Subjective: 03/05/21: Patient was seen and examined at bedside.  He is alert and oriented x3.  He denies have any chest pain or dyspnea at rest.  Cough is improved.  He would like to go home instead of going to SNF.  Ongoing weaning off oxygen supplementation.  Assessment/Plan: Active Problems:   Acute respiratory failure with hypoxia (HCC)   Septic shock (HCC)  Septic shock, shock has resolved, he is no longer on vasopressor, secondary to right lower lobe pneumonia. Currently off IV vasopressors, however he is on midodrine 5 mg 3 times daily. Maintaining his MAP greater than 65. Continue IV vancomycin and Zosyn. MRSA screening test positive on 02/27/2021 Sputum culture, few yeast. Procalcitonin downtrending, 5 from 25 from greater than 150 on 02/28/2021. Continue to monitor fever curve and WBC. Continue current IV antibiotics regimen.  Acute hypoxic hypercarbic respiratory failure secondary to right lower lobe pneumonia and pulmonary edema. Maintain O2 saturation between 88 and 94% as recommended by pulmonary. Continue to wean off oxygen supplementation as tolerated. Currently on 6 L with O2 saturation 92%. Continue BiPAP at night.  Acute on chronic diastolic  CHF  Last 2D echo done on 02/28/2021 showed normal LVEF 55 to 60% with grade 2 diastolic dysfunction. BNP uprising greater than 2400 from 1100 on 02/26/2021 Start strict I's and O's and daily weights Off diuretics due to recent septic shock requiring vasopressors Fluid restriction. Net I&O -2.1 L  Resolved nonoliguric AKI Baseline creatinine appears to be 1.0 GFR greater than 60 Presented with creatinine of 3.05 with GFR 22. Creatinine 1.1 with GFR greater than 60. Continue to avoid nephrotoxic agents Continue to monitor urine output Last urine output 3.1 L recorded in the last 24 hours. Repeat renal panel in the morning.  Dysphagia with mild risk for aspiration. Currently on dysphagia 3 diet Appreciate speech therapy's assessment and assistance. Continue aspiration precautions.  GERD Reported burning pain after eating his breakfast this morning GI cocktail ordered Continue p.o. Protonix 40 mg daily.  History of lacunar CVA Head CT done on 01/29/2021, ordered after unwitnessed fall revealed: Small chronic bilateral basal ganglia lacunar infarcts. States he has been dropping things unintentionally from his hands. Continue aspirin Plavix and Zetia.  Resolved atypical chest pain, reproducible on palpation. Troponin elevated, peaked at 56 and trended down. Continue to monitor on telemetry.  Refractory hypokalemia Serum potassium 3.0> 4.1>3.0> 3.4 Repleted orally Repeat in the morning  Refractory hypomagnesemia Serum magnesium 1.1> 1.9> 1.4 Repleted intravenously Repeat in the morning  Improving post repletion: Hypophosphatemia Serum phosphorus 1.4> 1.6> 2.4 Repleted orally. Repeat level in the morning  Chronic back pain on Suboxone PTA Normally on Suboxone at home as well as gabapentin. Reduce home dose of gabapentin, he was taking 800 mg 4 times a day. UDS positive for benzodiazepine and opiates.   TOC will provide resources prior to DC.  Essential hypertension, BPs  are currently soft. Recently off vasopressors due to septic shock.  Continue to hold off home oral antihypertensives. He is currently on midodrine 5 mg 3 times daily to maintain MAP greater than 65, continue. Restart beta-blocker to avoid beta-blockade withdrawal. He was on Toprol-XL 50 mg daily, resume at 12.5 mg daily and adjust doses as needed.  Impaired glucose tolerance He is on insulin sliding scale. Hemoglobin A1c 5.5 on 01/30/2021 Avoid hypoglycemia.  Physical debility PT OT recommending SNF TOC consulted to assist with SNF placement. Fall precautions.    Code Status: Full code.  Family Communication: Updated his daughter via phone  Disposition Plan: Likely will discharge to home once hemodynamically stable and on oxygen requirement has improved..   Consultants:  Pulmonary  Procedures:  None.  Antimicrobials:  IV vancomycin and Zosyn.  DVT prophylaxis: SQ heparin 3 times daily.  Status is: Inpatient    Dispo:  Patient From: Home  Planned Disposition: Home with home health services.  Medically stable for discharge: No, ongoing management of sepsis secondary to right lower lobe pneumonia.  Shock has resolved, now off vasopressors.          Objective: Vitals:   03/05/21 0534 03/05/21 0741 03/05/21 1130 03/05/21 1309  BP: 111/70 (!) 142/76 135/82   Pulse: 95 91 83   Resp: 20 20 18    Temp: 99 F (37.2 C) 99.3 F (37.4 C) 98.3 F (36.8 C)   TempSrc: Oral  Oral   SpO2: 93% 98% 98% 93%  Weight:      Height:        Intake/Output Summary (Last 24 hours) at 03/05/2021 1528 Last data filed at 03/05/2021 1500 Gross per 24 hour  Intake 452.47 ml  Output 600 ml  Net -147.53  ml   Filed Weights   03/01/21 0500 03/03/21 0450 03/04/21 0500  Weight: 83.2 kg 81.1 kg 81.2 kg    Exam:  . General: 65 y.o. year-old male chronically ill-appearing no acute distress.  He is alert and oriented x3.   . Cardiovascular: Regular rate and rhythm no rubs or  gallops. [name] Kitchen Respiratory: Mild rales at bases with no wheezing noted. . Abdomen: Soft nontender normal bowel sounds present..   . Musculoskeletal: Trace lower extremity edema bilaterally.   . Skin: No ulcerative lesions noted. [name] Kitchen Psychiatry: Mood is appropriate for condition and setting.   Data Reviewed: CBC: Recent Labs  Lab 03/01/21 0528 03/02/21 0444 03/03/21 0428 03/04/21 0005 03/05/21 0553  WBC 23.9* 29.7* 24.5* 13.6* 18.0*  NEUTROABS 22.7* 27.9* 21.2* 10.7* 15.2*  HGB 8.7* 9.6* 10.2* 9.6* 9.2*  HCT 26.5* 28.9* 32.4* 29.4* 28.7*  MCV 93.0 91.7 94.2 92.2 92.9  PLT 128* 145* 170 164 505   Basic Metabolic Panel: Recent Labs  Lab 03/01/21 0528 03/01/21 1554 03/02/21 0444 03/02/21 1354 03/03/21 0428 03/03/21 1602 03/04/21 0005 03/05/21 0553  NA 139  --  140  --  141  --  132* 135  K 2.6*   < > 3.0* 3.1* 4.1  --  3.0* 3.4*  CL 102  --  105  --  100  --  93* 97*  CO2 24  --  26  --  28  --  28 29  GLUCOSE 95  --  121*  --  79  --  144* 105*  BUN 39*  --  34*  --  25*  --  20 17  CREATININE 1.46*  --  1.23  --  1.16  --  1.06 1.05  CALCIUM 7.8*  --  8.4*  --  8.5*  --  8.2* 8.4*  MG 2.1  --  1.7  --  1.1* 2.4 1.9 1.3*  PHOS 2.5  --  1.4*  --  1.6* 1.3* 2.4* 3.8   < > = values in this interval not displayed.   GFR: Estimated Creatinine Clearance: 69.2 mL/min (by C-G formula based on SCr of 1.05 mg/dL). Liver Function Tests: Recent Labs  Lab 02/26/21 1915  AST 27  ALT 11  ALKPHOS 59  BILITOT 0.5  PROT 6.8  ALBUMIN 3.5   No results for input(s): LIPASE, AMYLASE in the last 168 hours. No results for input(s): AMMONIA in the last 168 hours. Coagulation Profile: No results for input(s): INR, PROTIME in the last 168 hours. Cardiac Enzymes: No results for input(s): CKTOTAL, CKMB, CKMBINDEX, TROPONINI in the last 168 hours. BNP (last 3 results) No results for input(s): PROBNP in the last 8760 hours. HbA1C: No results for input(s): HGBA1C in the last 72  hours. CBG: Recent Labs  Lab 03/04/21 1118 03/04/21 1629 03/04/21 2119 03/05/21 0729 03/05/21 1131  GLUCAP 160* 125* 163* 118* 171*   Lipid Profile: No results for input(s): CHOL, HDL, LDLCALC, TRIG, CHOLHDL, LDLDIRECT in the last 72 hours. Thyroid Function Tests: No results for input(s): TSH, T4TOTAL, FREET4, T3FREE, THYROIDAB in the last 72 hours. Anemia Panel: No results for input(s): VITAMINB12, FOLATE, FERRITIN, TIBC, IRON, RETICCTPCT in the last 72 hours. Urine analysis:    Component Value Date/Time   COLORURINE YELLOW (A) 02/27/2021 0330   APPEARANCEUR HAZY (A) 02/27/2021 0330   APPEARANCEUR Clear 03/24/2013 1853   LABSPEC 1.017 02/27/2021 0330   LABSPEC 1.020 03/24/2013 1853   PHURINE 5.0 02/27/2021 0330   GLUCOSEU 150 (A)  02/27/2021 0330   GLUCOSEU Negative 03/24/2013 1853   HGBUR LARGE (A) 02/27/2021 0330   BILIRUBINUR NEGATIVE 02/27/2021 0330   BILIRUBINUR Negative 03/24/2013 1853   KETONESUR NEGATIVE 02/27/2021 0330   PROTEINUR 30 (A) 02/27/2021 0330   NITRITE NEGATIVE 02/27/2021 0330   LEUKOCYTESUR NEGATIVE 02/27/2021 0330   LEUKOCYTESUR Negative 03/24/2013 1853   Sepsis Labs: @LABRCNTIP (procalcitonin:4,lacticidven:4)  ) Recent Results (from the past 240 hour(s))  Resp Panel by RT-PCR (Flu A&B, Covid) Nasopharyngeal Swab     Status: None   Collection Time: 02/26/21  7:11 PM   Specimen: Nasopharyngeal Swab; Nasopharyngeal(NP) swabs in vial transport medium  Result Value Ref Range Status   SARS Coronavirus 2 by RT PCR NEGATIVE NEGATIVE Final    Comment: (NOTE) SARS-CoV-2 target nucleic acids are NOT DETECTED.  The SARS-CoV-2 RNA is generally detectable in upper respiratory specimens during the acute phase of infection. The lowest concentration of SARS-CoV-2 viral copies this assay can detect is 138 copies/mL. A negative result does not preclude SARS-Cov-2 infection and should not be used as the sole basis for treatment or other patient management  decisions. A negative result may occur with  improper specimen collection/handling, submission of specimen other than nasopharyngeal swab, presence of viral mutation(s) within the areas targeted by this assay, and inadequate number of viral copies(<138 copies/mL). A negative result must be combined with clinical observations, patient history, and epidemiological information. The expected result is Negative.  Fact Sheet for Patients:  EntrepreneurPulse.com.au  Fact Sheet for Healthcare Providers:  IncredibleEmployment.be  This test is no t yet approved or cleared by the [place] FDA and  has been authorized for detection and/or diagnosis of SARS-CoV-2 by FDA under an Emergency Use Authorization (EUA). This EUA will remain  in effect (meaning this test can be used) for the duration of the COVID-19 declaration under Section 564(b)(1) of the Act, 21 U.S.C.section 360bbb-3(b)(1), unless the authorization is terminated  or revoked sooner.       Influenza A by PCR NEGATIVE NEGATIVE Final   Influenza B by PCR NEGATIVE NEGATIVE Final    Comment: (NOTE) The Xpert Xpress SARS-CoV-2/FLU/RSV plus assay is intended as an aid in the diagnosis of influenza from Nasopharyngeal swab specimens and should not be used as a sole basis for treatment. Nasal washings and aspirates are unacceptable for Xpert Xpress SARS-CoV-2/FLU/RSV testing.  Fact Sheet for Patients: EntrepreneurPulse.com.au  Fact Sheet for Healthcare Providers: IncredibleEmployment.be  This test is not yet approved or cleared by the [place] FDA and has been authorized for detection and/or diagnosis of SARS-CoV-2 by FDA under an Emergency Use Authorization (EUA). This EUA will remain in effect (meaning this test can be used) for the duration of the COVID-19 declaration under Section 564(b)(1) of the Act, 21 U.S.C. section 360bbb-3(b)(1), unless the  authorization is terminated or revoked.  Performed at [facility], [address] [place], [place] [address]   Blood culture (routine x 2)     Status: None   Collection Time: 02/26/21  7:11 PM   Specimen: BLOOD  Result Value Ref Range Status   Specimen Description BLOOD BLOOD RIGHT ARM  Final   Special Requests   Final    BOTTLES DRAWN AEROBIC AND ANAEROBIC Blood Culture adequate volume   Culture   Final    NO GROWTH 5 DAYS Performed at [facility], [address], [place], [place] [address]    Report Status 03/03/2021 FINAL  Final  Blood culture (routine x 2)  Status: None   Collection Time: 02/26/21  7:11 PM   Specimen: BLOOD  Result Value Ref Range Status   Specimen Description BLOOD BLOOD LEFT ARM  Final   Special Requests   Final    BOTTLES DRAWN AEROBIC AND ANAEROBIC Blood Culture adequate volume   Culture   Final    NO GROWTH 5 DAYS Performed at [facility], [address] [place], [place] [address]    Report Status 03/03/2021 FINAL  Final  MRSA PCR Screening     Status: Abnormal   Collection Time: 02/27/21 10:58 PM   Specimen: Nasopharyngeal  Result Value Ref Range Status   MRSA by PCR POSITIVE (A) NEGATIVE Final    Comment:        The GeneXpert MRSA Assay (FDA approved for NASAL specimens only), is one component of a comprehensive MRSA colonization surveillance program. It is not intended to diagnose MRSA infection nor to guide or monitor treatment for MRSA infections. RESULT CALLED TO, READ BACK BY AND VERIFIED WITH: CICI VANN'BRAY @0047  ON 02/28/21 SKL Performed at [facility], [place], [place] [address]   Expectorated Sputum Assessment w Gram Stain, Rflx to Resp Cult     Status: None   Collection Time: 02/28/21  5:32 PM   Specimen: Expectorated Sputum  Result Value Ref Range Status   Specimen Description EXPECTORATED SPUTUM  Final   Special Requests NONE  Final   Sputum evaluation    Final    Sputum specimen not acceptable for testing.  Please recollect.   RESULT CALLED TO, READ BACK BY AND VERIFIED WITH: CHARLIE FOEEPWOOD AT [place] ON 02/28/21 BY SS Performed at [facility], [address] [place], [place] [address]    Report Status 02/28/2021 FINAL  Final  Expectorated Sputum Assessment w Gram Stain, Rflx to Resp Cult     Status: None   Collection Time: 03/03/21  6:46 AM   Specimen: Sputum  Result Value Ref Range Status   Specimen Description SPUTUM  Final   Special Requests NONE  Final   Sputum evaluation   Final    THIS SPECIMEN IS ACCEPTABLE FOR SPUTUM CULTURE Performed at [facility], [address], [place], [place] [address]    Report Status 03/03/2021 FINAL  Final  Culture, Respiratory w Gram Stain     Status: None   Collection Time: 03/03/21  6:46 AM   Specimen: SPU  Result Value Ref Range Status   Specimen Description   Final    SPUTUM Performed at [facility], [address], [place], [place] [address]    Special Requests   Final    NONE Reflexed from [id number] Performed at [facility], [place], [place] [address]    Gram Stain FEW WBC PRESENT, PREDOMINANTLY PMN FEW YEAST   Final   Culture   Final    FEW Consistent with normal respiratory flora. No Pseudomonas species isolated Performed at [facility] [address], [place],  [address]    Report Status 03/05/2021 FINAL  Final      Studies: No results found.  Scheduled Meds: . allopurinol  300 mg Oral BID  . aspirin EC  81 mg Oral Daily  . budesonide (PULMICORT) nebulizer solution  0.25 mg Nebulization BID  . Chlorhexidine Gluconate Cloth  6 each Topical Daily  . cholecalciferol  1,000 Units Oral Daily  . clopidogrel  75 mg Oral Daily  . DULoxetine  30 mg  Oral Daily  . ezetimibe  10 mg Oral Daily  . gabapentin  100 mg Oral QID  . heparin  5,000 Units Subcutaneous Q8H  . HYDROmorphone  2 mg Oral Q6H  . insulin  aspart  0-15 Units Subcutaneous TID WC  . insulin aspart  0-5 Units Subcutaneous QHS  . ipratropium-albuterol  3 mL Nebulization Q6H  . mouth rinse  15 mL Mouth Rinse BID  . metoprolol succinate  12.5 mg Oral Daily  . midodrine  5 mg Oral TID WC  . pantoprazole  40 mg Oral Daily  . potassium chloride  40 mEq Oral BID  . simvastatin  40 mg Oral Daily  . sodium chloride flush  10-40 mL Intracatheter Q12H    Continuous Infusions: . magnesium sulfate bolus IVPB 2 g (03/05/21 1448)  . piperacillin-tazobactam (ZOSYN)  IV 3.375 g (03/05/21 0530)  . vancomycin 1,500 mg (03/04/21 2141)     LOS: 7 days     Kayleen Memos, MD [facility] Pager [phone]  If 7PM-7AM, please contact night-coverage [email or web address] Password [facility] 03/05/2021, 3:28 PM

## 2021-03-05 NOTE — Progress Notes (Signed)
Physical Therapy Treatment Patient Details Name: Christian Fields MRN: [id number] DOB: 09/30/1956 Today's Date: 03/05/2021    History of Present Illness 65 y.o. male admitted on 02/26/2021 with pneumonia (CAP). Patient presented with SOB x 2 days. Has a PMH of left upper lobe lung cancer and COPD on chronic 2L [place]. Patient was admitted with AKI that is improving. Pharmacy was consulted for vancomycin and Zosyn dosing.    PT Comments    Therapist in to see pt this pm, pt resting in bed c/o burning in B feet, requesting gabapentin - nursing notified.  Pt refused there ex or OOB activity due to c/o discomfort and not "feeling" like moving.  Pt educated on benefits of mobility despite no change in compliance.  Pt assisted with repositioning to facilitate skin integrity and promote upright posture.  Continue to recommend SNF/24 hour assistance upon d/c.    Follow Up Recommendations  SNF;Supervision/Assistance - 24 hour     Equipment Recommendations  None recommended by PT    Recommendations for Other Services       Precautions / Restrictions Precautions Precautions: Fall Restrictions Weight Bearing Restrictions: No    Mobility  Bed Mobility Overal bed mobility: Needs Assistance Bed Mobility: Rolling           General bed mobility comments: MaxA to scoot up in bed    Transfers                 General transfer comment: Refused OOB  Ambulation/Gait                 Stairs             Wheelchair Mobility    Modified Rankin (Stroke Patients Only)       Balance                                            Cognition Arousal/Alertness: Awake/alert Behavior During Therapy: WFL for tasks assessed/performed Overall Cognitive Status: Within Functional Limits for tasks assessed                                 General Comments: Agreeable to tx, somewhat irritable.      Exercises      General Comments         Pertinent Vitals/Pain Pain Assessment: 0-10 Pain Score: 4  Pain Location:  (B Feet) Pain Descriptors / Indicators: Burning    Home Living                      Prior Function            PT Goals (current goals can now be found in the care plan section) Acute Rehab PT Goals Patient Stated Goal: to go home    Frequency    Min 2X/week      PT Plan      Co-evaluation              AM-PAC PT "6 Clicks" Mobility   Outcome Measure  Help needed turning from your back to your side while in a flat bed without using bedrails?: A Little Help needed moving from lying on your back to sitting on the side of a flat bed without using bedrails?: A Lot Help needed moving to and from a  bed to a chair (including a wheelchair)?: A Lot Help needed standing up from a chair using your arms (e.g., wheelchair or bedside chair)?: A Lot Help needed to walk in hospital room?: A Lot Help needed climbing 3-5 steps with a railing? : Total 6 Click Score: 12    End of Session Equipment Utilized During Treatment: Oxygen Activity Tolerance: Patient limited by pain;Treatment limited secondary to agitation Patient left: in bed;with call bell/phone within reach;with bed alarm set Nurse Communication: Mobility status PT Visit Diagnosis: Unsteadiness on feet (R26.81);Difficulty in walking, not elsewhere classified (R26.2);Muscle weakness (generalized) (M62.81)     Time: 1517-6160 PT Time Calculation (min) (ACUTE ONLY): 8 min  Charges:  $Therapeutic Activity: 8-22 mins                     Mikel Cella, PTA  [name] 03/05/2021, 2:59 PM

## 2021-03-05 NOTE — Progress Notes (Signed)
[facility] Electrolyte Monitoring and Replacement   Recent Labs: Potassium (mmol/L)  Date Value  03/05/2021 3.4 (L)  05/17/2014 3.5   Magnesium (mg/dL)  Date Value  03/05/2021 1.3 (L)   Calcium (mg/dL)  Date Value  03/05/2021 8.4 (L)   Calcium, Total (mg/dL)  Date Value  03/31/2014 9.3   Albumin (g/dL)  Date Value  02/26/2021 3.5  09/24/2013 3.6   Phosphorus (mg/dL)  Date Value  03/05/2021 3.8   Sodium (mmol/L)  Date Value  03/05/2021 135  03/31/2014 136   Corrected Ca: 8.9 mg/dL  Assessment: 65 yo M presented with shortness of breath concerning for CAP. PMH includes COPD, diabetes, L upper lobe lung cancer s/p radiation, chronic back pain. Pharmacy consulted to assist in electrolyte management.    Goal of Therapy:  Electrolytes WNL  Plan:  K 3.4  Mag 1.3  Phos 3.8  Na 135 Scr 1.05  KCl 35mEq x 1 dose   Mag Sulfate 2g IV x 2 doses  Monitor electrolytes with AM labs  Pearla Dubonnet, PharmD 03/05/2021 1:43 PM

## 2021-03-05 NOTE — Progress Notes (Signed)
Daily Progress Note   Patient Name: Christian Fields       Date: 03/05/2021 DOB: 1956-05-06  Age: 65 y.o. MRN#: [id number] Attending Physician: Kayleen Memos, DO Primary Care Physician: Remi Haggard, FNP Admit Date: 02/26/2021  Reason for Consultation/Follow-up: Establishing goals of care  Subjective: Patient is resting in bed with his daughter at bedside.  He states he is feeling better than he did.  His daughter brought him lunch.  He took 2 bites of it, began to cough, and closed the wrapper.  The cough sounded moist.  His daughter states that his appetite has been extremely poor since before the hospitalization.  He states that he does not stop eating because of swallowing issues, he stops eating because he is full.  We discussed his diagnosis, prognosis, GOC, EOL wishes disposition and options.  A detailed discussion was had today regarding advanced directives.  Concepts specific to code status, artifical feeding and hydration, IV antibiotics and rehospitalization were discussed.  The difference between an aggressive medical intervention path and a comfort care path was discussed.  Values and goals of care important to patient and family were attempted to be elicited.  Discussed limitations of medical interventions to prolong quality of life in some situations and discussed the concept of human mortality.  Patient states that he would never want a feeding tube, and he would never be willing to have diet modifications or restrictions.  He states he would rather be comfortable until death.  Patient states he does not plan to wear a BiPAP outside of the hospital, and is not happy with needing the oxygen via nasal cannula.  He states that he is unsure whether or not he would want to have CPR,  or be placed on the ventilator wants to think about this.  Will return for further conversation.    Current Medications: Scheduled Meds:  . allopurinol  300 mg Oral BID  . aspirin EC  81 mg Oral Daily  . budesonide (PULMICORT) nebulizer solution  0.25 mg Nebulization BID  . Chlorhexidine Gluconate Cloth  6 each Topical Daily  . cholecalciferol  1,000 Units Oral Daily  . clopidogrel  75 mg Oral Daily  . DULoxetine  30 mg Oral Daily  . ezetimibe  10 mg Oral Daily  . gabapentin  100 mg  Oral QID  . heparin  5,000 Units Subcutaneous Q8H  . HYDROmorphone  2 mg Oral Q6H  . insulin aspart  0-15 Units Subcutaneous TID WC  . insulin aspart  0-5 Units Subcutaneous QHS  . ipratropium-albuterol  3 mL Nebulization Q6H  . mouth rinse  15 mL Mouth Rinse BID  . metoprolol succinate  12.5 mg Oral Daily  . midodrine  5 mg Oral TID WC  . pantoprazole  40 mg Oral Daily  . simvastatin  40 mg Oral Daily  . sodium chloride flush  10-40 mL Intracatheter Q12H    Continuous Infusions: . magnesium sulfate bolus IVPB 2 g (03/05/21 1008)  . piperacillin-tazobactam (ZOSYN)  IV 3.375 g (03/05/21 0530)  . vancomycin 1,500 mg (03/04/21 2141)    PRN Meds: ALPRAZolam, docusate sodium, ondansetron (ZOFRAN) IV, polyethylene glycol, sodium chloride flush  Physical Exam Pulmonary:     Effort: Pulmonary effort is normal.     Comments: Moist cough. Neurological:     Mental Status: He is alert.             Vital Signs: BP 135/82 (BP Location: Left Arm)   Pulse 83   Temp 98.3 F (36.8 C) (Oral)   Resp 18   Ht 5\' 9"  (1.753 m)   Wt 81.2 kg   SpO2 98%   BMI 26.44 kg/m  SpO2: SpO2: 98 % O2 Device: O2 Device: Nasal Cannula O2 Flow Rate: O2 Flow Rate (L/min): 6 L/min  Intake/output summary:   Intake/Output Summary (Last 24 hours) at 03/05/2021 1243 Last data filed at 03/05/2021 0500 Gross per 24 hour  Intake 240 ml  Output 600 ml  Net -360 ml   LBM: Last BM Date: 03/04/21 Baseline Weight: Weight:  74.4 kg Most recent weight: Weight: 81.2 kg       Patient Active Problem List   Diagnosis Date Noted  . Septic shock ([place]) 02/26/2021  . Polysubstance (excluding opioids) dependence ([place]) 01/30/2021  . Acute metabolic encephalopathy 58/85/0277  . COPD with acute exacerbation ([place]) 01/29/2021  . Iron deficiency anemia 11/27/2020  . Severe sepsis ([place])   . NSCLC of left lung ([place]) 11/12/2020  . Left leg swelling 04/28/2020  . Confusion   . Acute respiratory failure with hypoxia ([place]) 03/26/2020  . Pneumonia of both lower lobes due to infectious organism 03/25/2020  . AKI (acute kidney injury) ([place]) 01/13/2020  . Cellulitis of left leg 01/12/2020  . COVID-19 virus detected 01/12/2020  . Diabetic ulcer of left midfoot associated with type 2 diabetes mellitus, with fat layer exposed ([place]) 01/12/2020  . Panlobular emphysema ([place]) 01/12/2020  . Arthritis 10/29/2019  . Hemorrhoids 10/29/2019  . Senile nuclear sclerosis, bilateral 02/15/2019  . Cellulitis of left upper limb 02/11/2019  . AAA (abdominal aortic aneurysm) without rupture ([place]) 01/15/2019  . Localized swelling, mass and lump, upper limb 12/24/2018  . Pain in femur 12/24/2018  . TIA (transient ischemic attack) 11/18/2018  . Cortical age-related cataract of both eyes 11/02/2018  . Fracture of multiple ribs 04/20/2018  . Overweight (BMI 25.0-29.9) 04/20/2018  . Hypertension 07/08/2017  . Diabetes mellitus ([place]) 07/08/2017  . Hyperlipidemia 07/08/2017  . Tobacco use disorder 07/08/2017  . Atherosclerosis of native arteries of extremity with intermittent claudication ([place]) 07/08/2017  . SOB (shortness of breath) 06/19/2017  . Acute on chronic respiratory failure with hypoxia and hypercapnia ([place]) 12/15/2016  . CAP (community acquired pneumonia) 12/15/2016  . Chronic pain 12/15/2016  . Chronically on opiate therapy 12/15/2016  . History  of kidney stones 12/15/2016  . Polypharmacy 12/15/2016  . Somnolence 12/15/2016  .  Foot ulcer ([place]) 01/19/2016  . 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 Other psychoactive substance use, unspecified with withdrawal, uncomplicated ([place]) 47/82/9562  . Type 2 diabetes mellitus with foot ulcer (CODE) ([place]) 11/27/2015  . Chronic obstructive pulmonary disease, unspecified ([place]) 01/18/2013  . Depressive disorder 01/18/2013  . Hereditary and idiopathic peripheral neuropathy 01/18/2013  . Sleep apnea 01/18/2013  . Low back pain 11/23/2010  . Acute gouty arthropathy 07/10/2010  . Problem related to lifestyle 10/29/2004    Palliative Care Assessment & Plan    Recommendations/Plan:  We will continue to follow.  Recommend palliative outpatient.    Code Status:    Code Status Orders  (From admission, onward)         Start     Ordered   02/26/21 2155  Full code  Continuous        02/26/21 2157        Code Status History    Date Active Date Inactive Code Status Order ID Comments User Context   01/29/2021 2351 02/01/2021 1722 Full Code [id number]  Athena Masse, MD ED   11/12/2020 2253 11/18/2020 2110 Full Code [id number]  Neena Rhymes, MD ED   03/26/2020 0136 03/28/2020 2308 Full Code [id number]  Nicolette Bang, DO ED   01/19/2016 2021 01/20/2016 1818 Full Code [id number]  Hillary Bow, MD Inpatient   Advance Care Planning Activity         Thank you for allowing the Palliative Medicine Team to assist in the care of this patient.   Total Time 35 min Prolonged Time Billed  no       Greater than 50%  of this time was spent counseling and coordinating care related to the above assessment and plan.  Asencion Gowda, NP  Please contact Palliative Medicine Team phone at [phone] for questions and concerns.

## 2021-03-05 NOTE — TOC Progression Note (Signed)
Transition of Care [facility]) - Progression Note    Patient Details  Name: TAMON PARKERSON MRN: [id number] Date of Birth: 07-08-1956  Transition of Care [facility] CM/SW [place], RN Phone Number: 03/05/2021, 10:47 AM  Clinical Narrative:   TOC in to see patient and daughter at bedside.  Purpose of visit:  Discharge planning, all amenable to discuss.  TOC discussed recommendation of SNF, patient declined, stated he wants to go home with daughter.  Daughter amenable to plan.  Patient is established on oxygen at home.  Daughter states that she or family member will with patient 24/7 if he does go to her home on [place].  Daugther states no concerns with getting patient to appointments, or getting meds or supplies, she will maintain these for patient.  Daughter requests [facility] PT/OT if patient discharges to her home.  Care team notified of this request.  TOC contact information given to patient and family, no further questions at present.  TOC will follow through discharge.  Expected Discharge Plan: [place] Barriers to Discharge: Continued Medical Work up  Expected Discharge Plan and Services Expected Discharge Plan: [place] Acute Care Choice: Durable Medical Equipment (O2 2L chronic) Living arrangements for the past 2 months: Mobile Home                                       Social Determinants of Health (SDOH) Interventions    Readmission Risk Interventions Readmission Risk Prevention Plan 11/15/2020  Transportation Screening Complete  PCP or Specialist Appt within 3-5 Days Not Complete  Not Complete comments Pending discharge, unit clerk will schedule.  [place] or Home Care Consult Complete  Social Work Consult for [place] Planning/Counseling Complete  Palliative Care Screening Not Applicable  Medication Review Press photographer) Complete  Some recent data might be hidden

## 2021-03-06 LAB — CBC WITH DIFFERENTIAL/PLATELET
Abs Immature Granulocytes: 0.22 10*3/uL — ABNORMAL HIGH (ref 0.00–0.07)
Basophils Absolute: 0 10*3/uL (ref 0.0–0.1)
Basophils Relative: 0 %
Eosinophils Absolute: 0.5 10*3/uL (ref 0.0–0.5)
Eosinophils Relative: 3 %
HCT: 28.5 % — ABNORMAL LOW (ref 39.0–52.0)
Hemoglobin: 9.5 g/dL — ABNORMAL LOW (ref 13.0–17.0)
Immature Granulocytes: 1 %
Lymphocytes Relative: 9 %
Lymphs Abs: 1.4 10*3/uL (ref 0.7–4.0)
MCH: 30.4 pg (ref 26.0–34.0)
MCHC: 33.3 g/dL (ref 30.0–36.0)
MCV: 91.3 fL (ref 80.0–100.0)
Monocytes Absolute: 0.7 10*3/uL (ref 0.1–1.0)
Monocytes Relative: 5 %
Neutro Abs: 12.5 10*3/uL — ABNORMAL HIGH (ref 1.7–7.7)
Neutrophils Relative %: 82 %
Platelets: 311 10*3/uL (ref 150–400)
RBC: 3.12 MIL/uL — ABNORMAL LOW (ref 4.22–5.81)
RDW: 15.1 % (ref 11.5–15.5)
WBC: 15.3 10*3/uL — ABNORMAL HIGH (ref 4.0–10.5)
nRBC: 0 % (ref 0.0–0.2)

## 2021-03-06 LAB — BLOOD GAS, VENOUS
Acid-Base Excess: 7.9 mmol/L — ABNORMAL HIGH (ref 0.0–2.0)
Bicarbonate: 32.7 mmol/L — ABNORMAL HIGH (ref 20.0–28.0)
O2 Saturation: 71.3 %
Patient temperature: 37
pCO2, Ven: 46 mmHg (ref 44.0–60.0)
pH, Ven: 7.46 — ABNORMAL HIGH (ref 7.250–7.430)
pO2, Ven: 35 mmHg (ref 32.0–45.0)

## 2021-03-06 LAB — GLUCOSE, CAPILLARY
Glucose-Capillary: 106 mg/dL — ABNORMAL HIGH (ref 70–99)
Glucose-Capillary: 156 mg/dL — ABNORMAL HIGH (ref 70–99)
Glucose-Capillary: 141 mg/dL — ABNORMAL HIGH (ref 70–99)
Glucose-Capillary: 115 mg/dL — ABNORMAL HIGH (ref 70–99)

## 2021-03-06 LAB — BASIC METABOLIC PANEL
Anion gap: 8 (ref 5–15)
BUN: 13 mg/dL (ref 8–23)
CO2: 30 mmol/L (ref 22–32)
Calcium: 8.8 mg/dL — ABNORMAL LOW (ref 8.9–10.3)
Chloride: 97 mmol/L — ABNORMAL LOW (ref 98–111)
Creatinine, Ser: 0.98 mg/dL (ref 0.61–1.24)
GFR, Estimated: >60 mL/min (ref >60)
Glucose, Bld: 102 mg/dL — ABNORMAL HIGH (ref 70–99)
Potassium: 4.7 mmol/L (ref 3.5–5.1)
Sodium: 135 mmol/L (ref 135–145)

## 2021-03-06 LAB — MAGNESIUM: Magnesium: 2.3 mg/dL (ref 1.7–2.4)

## 2021-03-06 LAB — PHOSPHORUS: Phosphorus: 4.4 mg/dL (ref 2.5–4.6)

## 2021-03-06 LAB — PROCALCITONIN: Procalcitonin: 3.76 ng/mL

## 2021-03-06 MED ORDER — AZITHROMYCIN 500 MG PO TABS
500.0000 mg | ORAL_TABLET | Freq: Every day | ORAL | Status: AC
  Administered 2021-03-06 – 2021-03-08 (×3): 500 mg via ORAL
  Filled 2021-03-06 (×3): qty 1

## 2021-03-06 NOTE — Progress Notes (Signed)
Patient is complaining of headache and asked for bipap to be removed. Administered oral diluaded will re-attempt to apply bipap.

## 2021-03-06 NOTE — Progress Notes (Signed)
Occupational Therapy Treatment Patient Details Name: CALLIE BUNYARD MRN: [id number] DOB: 24-Apr-1956 Today's Date: 03/06/2021    History of present illness 65 y.o. male admitted on 02/26/2021 with pneumonia (CAP). Patient presented with SOB x 2 days. Has a PMH of left upper lobe lung cancer and COPD on chronic 2L [place]. Patient was admitted with AKI that is improving. Pharmacy was consulted for vancomycin and Zosyn dosing.   OT comments  Pt seen for OT treatment on this date. Upon arrival to room, pt awake and seated upright in bed. Pt agreeable to tx. Pt performed supine>sit transfer with HOB elevated and MIN GUARD, seated LB dressing MIN GUARD, stand pivot transfer bed<>BSC with MIN GUARD, and sit>stand toilet hygiene with MAX A (w/ unilateral UE support on RW). Of note, pt on 4L/min O2 via [place]. SpO2 88% following supine>sit, but quickly recovering to >90% following cues for PLB. SpO2 86% following small shuffled steps bed>recliner without AD, and able to recover to 93% within 1 min of seated rest break and cues for PLB. Pt is making good progress toward goals and continues to benefit from skilled OT services to maximize return to PLOF and minimize risk of future falls, injury, caregiver burden, and readmission. Will continue to follow POC. Discharge recommendation remains appropriate as pt continues to demonstrate limited awareness of deficits.   Follow Up Recommendations  SNF;Supervision/Assistance - 24 hour    Equipment Recommendations  Other (comment) (defer to next venue of care)       Precautions / Restrictions Precautions Precautions: Fall Restrictions Weight Bearing Restrictions: No       Mobility Bed Mobility Overal bed mobility: Needs Assistance Bed Mobility: Supine to Sit     Supine to sit: Min guard;HOB elevated          Transfers Overall transfer level: Needs assistance   Transfers: Sit to/from Stand Sit to Stand: Min guard Stand pivot transfers: Min guard             Balance Overall balance assessment: Needs assistance Sitting-balance support: Bilateral upper extremity supported Sitting balance-Leahy Scale: Good Sitting balance - Comments: While sitting EOB, pt able to bend over to don socks. No LOB observed   Standing balance support: During functional activity;Single extremity supported Standing balance-Leahy Scale: Poor Standing balance comment: Unilateral UE reliance on RW for increased support/balance during sit>stand peri-care                           ADL either performed or assessed with clinical judgement   ADL Overall ADL's : Needs assistance/impaired                     Lower Body Dressing: Min guard;Sitting/lateral leans Lower Body Dressing Details (indicate cue type and reason): To don socks while seated EOB Toilet Transfer: Min guard;BSC   Toileting- Clothing Manipulation and Hygiene: Maximal assistance;Sit to/from stand Toileting - Clothing Manipulation Details (indicate cue type and reason): MAX A for posterior peri-care with pt receiving unilateral UE support from RW     Functional mobility during ADLs: Min guard (Small shuffled steps bed>chair without AD)                 Cognition Arousal/Alertness: Awake/alert Behavior During Therapy: WFL for tasks assessed/performed Overall Cognitive Status: Within Functional Limits for tasks assessed  General Comments: Agreeable to tx, somewhat irritable. Demonstrates limited awareness of deficits during session              General Comments Pt on 4L/min O2 via . SpO2 88% following supine>sit, but quickly recovering to >90% following cues for PLB. SpO2 86% following small shuffled steps bed>recliner without AD. Able to recover to 93% within 1 min of seated rest break and cues for PLB    Pertinent Vitals/ Pain       Pain Assessment: Faces Faces Pain Scale: Hurts little more Pain Location: b/l hands and  feet Pain Descriptors / Indicators: Burning Pain Intervention(s): Monitored during session;Limited activity within patient's tolerance         Frequency  Min 2X/week        Progress Toward Goals  OT Goals(current goals can now be found in the care plan section)     Acute Rehab OT Goals Patient Stated Goal: to go home OT Goal Formulation: With patient Time For Goal Achievement: 03/17/21 Potential to Achieve Goals: [place] Discharge plan remains appropriate;Frequency remains appropriate       AM-PAC OT "6 Clicks" Daily Activity     Outcome Measure   Help from another person eating meals?: None Help from another person taking care of personal grooming?: A Little Help from another person toileting, which includes using toliet, bedpan, or urinal?: A Little Help from another person bathing (including washing, rinsing, drying)?: A Lot Help from another person to put on and taking off regular upper body clothing?: A Little Help from another person to put on and taking off regular lower body clothing?: A Little 6 Click Score: 18    End of Session Equipment Utilized During Treatment: Oxygen;Rolling walker (4L O2)  OT Visit Diagnosis: Unsteadiness on feet (R26.81);Muscle weakness (generalized) (M62.81)   Activity Tolerance Patient tolerated treatment well   Patient Left in chair;with call bell/phone within reach;with chair alarm set   Nurse Communication Mobility status        Time: 9826-4158 OT Time Calculation (min): 31 min  Charges: OT General Charges $OT Visit: 1 Visit OT Treatments $Self Care/Home Management : 23-37 mins  Fredirick Maudlin, OTR/L [place]

## 2021-03-06 NOTE — Progress Notes (Signed)
Daily Progress Note   Patient Name: Christian Fields       Date: 03/06/2021 DOB: 09-09-1956  Age: 65 y.o. MRN#: [id number] Attending Physician: Kayleen Memos, DO Primary Care Physician: Remi Haggard, FNP Admit Date: 02/26/2021  Reason for Consultation/Follow-up: Establishing goals of care  Subjective: Patient is sitting in bed. He states he is feeling better. Followed up conversation yesterday, and patient states he plans to discuss his feeling on care with his daughter this week and has made no decisions at this time.   Length of Stay: 8  Current Medications: Scheduled Meds:  . allopurinol  300 mg Oral BID  . aspirin EC  81 mg Oral Daily  . azithromycin  500 mg Oral Daily  . budesonide (PULMICORT) nebulizer solution  0.25 mg Nebulization BID  . Chlorhexidine Gluconate Cloth  6 each Topical Daily  . cholecalciferol  1,000 Units Oral Daily  . clopidogrel  75 mg Oral Daily  . DULoxetine  30 mg Oral Daily  . ezetimibe  10 mg Oral Daily  . gabapentin  200 mg Oral QID  . heparin  5,000 Units Subcutaneous Q8H  . HYDROmorphone  2 mg Oral Q6H  . insulin aspart  0-15 Units Subcutaneous TID WC  . insulin aspart  0-5 Units Subcutaneous QHS  . ipratropium-albuterol  3 mL Nebulization Q6H  . mouth rinse  15 mL Mouth Rinse BID  . metoprolol succinate  12.5 mg Oral Daily  . midodrine  5 mg Oral TID WC  . pantoprazole  40 mg Oral Daily  . simvastatin  40 mg Oral Daily  . sodium chloride flush  10-40 mL Intracatheter Q12H    Continuous Infusions:   PRN Meds: ALPRAZolam, docusate sodium, ondansetron (ZOFRAN) IV, polyethylene glycol, sodium chloride flush  Physical Exam Pulmonary:     Effort: Pulmonary effort is normal.  Neurological:     Mental Status: He is alert.              Vital Signs: BP 126/72 (BP Location: Left Arm)   Pulse 87   Temp 99 F (37.2 C)   Resp 18   Ht 5\' 9"  (1.753 m)   Wt 74.8 kg   SpO2 94%   BMI 24.35 kg/m  SpO2: SpO2: 94 % O2 Device: O2 Device: Nasal Cannula O2 Flow  Rate: O2 Flow Rate (L/min): 4 L/min  Intake/output summary:   Intake/Output Summary (Last 24 hours) at 03/06/2021 1312 Last data filed at 03/06/2021 1002 Gross per 24 hour  Intake 921 ml  Output 1600 ml  Net -679 ml   LBM: Last BM Date: 03/05/21 Baseline Weight: Weight: 74.4 kg Most recent weight: Weight: 74.8 kg        Patient Active Problem List   Diagnosis Date Noted  . Septic shock ([place]) 02/26/2021  . Polysubstance (excluding opioids) dependence ([place]) 01/30/2021  . Acute metabolic encephalopathy 76/72/0947  . COPD with acute exacerbation ([place]) 01/29/2021  . Iron deficiency anemia 11/27/2020  . Severe sepsis ([place])   . NSCLC of left lung ([place]) 11/12/2020  . Left leg swelling 04/28/2020  . Confusion   . Acute respiratory failure with hypoxia ([place]) 03/26/2020  . Pneumonia of both lower lobes due to infectious organism 03/25/2020  . AKI (acute kidney injury) ([place]) 01/13/2020  . Cellulitis of left leg 01/12/2020  . COVID-19 virus detected 01/12/2020  . Diabetic ulcer of left midfoot associated with type 2 diabetes mellitus, with fat layer exposed ([place]) 01/12/2020  . Panlobular emphysema ([place]) 01/12/2020  . Arthritis 10/29/2019  . Hemorrhoids 10/29/2019  . Senile nuclear sclerosis, bilateral 02/15/2019  . Cellulitis of left upper limb 02/11/2019  . AAA (abdominal aortic aneurysm) without rupture ([place]) 01/15/2019  . Localized swelling, mass and lump, upper limb 12/24/2018  . Pain in femur 12/24/2018  . TIA (transient ischemic attack) 11/18/2018  . Cortical age-related cataract of both eyes 11/02/2018  . Fracture of multiple ribs 04/20/2018  . Overweight (BMI 25.0-29.9) 04/20/2018  . Hypertension 07/08/2017  . Diabetes mellitus ([place]) 07/08/2017  .  Hyperlipidemia 07/08/2017  . Tobacco use disorder 07/08/2017  . Atherosclerosis of native arteries of extremity with intermittent claudication ([place]) 07/08/2017  . SOB (shortness of breath) 06/19/2017  . Acute on chronic respiratory failure with hypoxia and hypercapnia ([place]) 12/15/2016  . CAP (community acquired pneumonia) 12/15/2016  . Chronic pain 12/15/2016  . Chronically on opiate therapy 12/15/2016  . History of kidney stones 12/15/2016  . Polypharmacy 12/15/2016  . Somnolence 12/15/2016  . Foot ulcer ([place]) 01/19/2016  . 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 Other psychoactive substance use, unspecified with withdrawal, uncomplicated ([place]) 29/47/6546  . Type 2 diabetes mellitus with foot ulcer (CODE) ([place]) 11/27/2015  . Chronic obstructive pulmonary disease, unspecified ([place]) 01/18/2013  . Depressive disorder 01/18/2013  . Hereditary and idiopathic peripheral neuropathy 01/18/2013  . Sleep apnea 01/18/2013  . Low back pain 11/23/2010  . Acute gouty arthropathy 07/10/2010  . Problem related to lifestyle 10/29/2004    Palliative Care Assessment & Plan    Recommendations/Plan: Recommend outpatient palliative.     Code Status:    Code Status Orders  (From admission, onward)         Start     Ordered   02/26/21 2155  Full code  Continuous        02/26/21 2157        Code Status History    Date Active Date Inactive Code Status Order ID Comments User Context   01/29/2021 2351 02/01/2021 1722 Full Code [id number]  Athena Masse, MD ED   11/12/2020 2253 11/18/2020 2110 Full Code [id number]  Neena Rhymes, MD ED   03/26/2020 0136 03/28/2020 2308 Full Code [id number]  Nicolette Bang, DO ED   01/19/2016 2021 01/20/2016 1818 Full Code [id number]  Hillary Bow, MD Inpatient   Advance  Care Planning Activity      Prognosis: Poor overall  Thank you for allowing the Palliative Medicine Team to assist in the care of this  patient.   Total Time 15 min Prolonged Time Billed  no      Greater than 50%  of this time was spent counseling and coordinating care related to the above assessment and plan.  Asencion Gowda, NP  Please contact Palliative Medicine Team phone at [phone] for questions and concerns.

## 2021-03-06 NOTE — Progress Notes (Signed)
Speech Language Pathology Treatment: Dysphagia  Patient Details Name: Christian Fields MRN: [id number] DOB: Apr 30, 1956 Today's Date: 03/06/2021 Time: 1345-1445 SLP Time Calculation (min) (ACUTE ONLY): 60 min  Assessment / Plan / Recommendation Clinical Impression  Pt seen today for ongoing assessment of toleration of oral diet; education on general aspiration precautions and strategies to help support safer swallowing. Much education was had w/ pt then Daughter via phone re: pt's swallowing impacted by Pulmonary status(decline); dysphagia; aspiration; impact of aspiration including pneumonia; food and liquid consistencies; food prep; pt's [place] re: his oral diet preferences. Pt endorsed he has had contact w/ [facility] re: swallowing issues, then stated "I'm not drinking those thickened liquids".  Pt exhibited a fairly immediate cough w/ sips of thin liquids Via Cup(2/4 trials). Pt has been noted by other Team members to cough intermittently during oral intake. When asked about this, pt stated he was "fine" and that he does not feel the thin liquids are choking him. Pt's overall oral intake is minimal despite family bringing in foods of preference. He continues to need verbal cues and support w/ follow through w/ aspiration precautions including cues to Sit Fully Upright for drinking and eating; less talking and monitoring his breathing during tasks. Pt continues to present w/ increased risk for aspiration especially of thin liquids thus risk for impact on Pulmonary status w/ decline. Unsure of much different than from his Baseline per further discussion w/ pt and Daughter indicating previous f/u at [facility] for swallowing issues in the past. Recommend continue w/ a Mech Soft-Regular diet w/ foods cooked/moistened well and cut Small for ease of intake and conservation of energy; Thin liquids VIA CUP(NO Straws). Strict aspiration precautions including Rest Breaks and Less Talking during meals to lessen any WOB/SOB.  Pills Whole in Puree for safer swallowing. As pt appears at/near his Baseline re: swallowing, and that pt has verbally stated he does not want further assessment of swallowing w/ MBSS nor a changed diet to thickened liquids, no further skilled ST services indicated at this time. Education on precautions and strategies was discussed w/ both pt and Daughter(via phone); Daughter and pt agreed. MD and Palliative Care updated on the above. Recommend Palliative Care f/u at Home post D/C for [place].    HPI HPI: Pt is a 65 year old male with known COPD on chronic 2 L nasal cannula at home and left upper lobe lung cancer status post radiation presents to the ED from home via EMS in acute hypoxic respiratory failure ultimately requiring BiPAP and vasopressors for septic shock.  PMH includes ETOH use.  Per ED documentation, upon EMS arrival they were unable to obtain SPO2 readings.  Patient was placed on a nonrebreather for oxygen support with SPO2 readings in the high 80's.  He also received 2 duo nebs in route with EMS.  CXR: borderline heart size. Stable aortic and hilar contours. Congested  appearance of central vessels with diffuse interstitial  opacification. There is underlying emphysema - pulmonary edema favored per note.  Head CT in 01/2021: "Mild to Moderate severity cerebral atrophy. 2. Small chronic bilateral basal ganglia lacunar infarcts".   Pt endorsed a h/o swallowing issues for which he received f/u from [facility]; thickened liquids were involved.      SLP Plan  All goals met       Recommendations  Diet recommendations: Regular;Thin liquid (meats cut well; moistened foods. Less tough meats) Liquids provided via: Cup;No straw Medication Administration: Whole meds with puree (for safer swallowing now and for  D/C) Supervision: Patient able to self feed;Intermittent supervision to cue for compensatory strategies (setup support) Compensations: Minimize environmental distractions;Slow rate;Small  sips/bites;Lingual sweep for clearance of pocketing;Multiple dry swallows after each bite/sip;Follow solids with liquid Postural Changes and/or Swallow Maneuvers: Upright 30-60 min after meal;Seated upright 90 degrees;Out of bed for meals                General recommendations:  (Palliative Care consult for [place]; Dietician) Oral Care Recommendations: Oral care BID;Oral care before and after PO;Staff/trained caregiver to provide oral care Follow up Recommendations:  (TBD by pt/family, MD) SLP Visit Diagnosis: Dysphagia, oropharyngeal phase (R13.12) (Poor oral intake) Plan: All goals met       GO                 Orinda Kenner, MS, CCC-SLP Speech Language Pathologist Rehab Services [phone] [facility] 03/06/2021, 5:04 PM

## 2021-03-06 NOTE — Progress Notes (Signed)
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 Per ED documentation, upon EMS arrival they were unable to obtain SPO2 readings.  Patient was placed on a nonrebreather for oxygen support with SPO2 readings in the high 80's.  He also received 2 duo nebs in route with EMS. ED course: Initial vitals-99.1, tachypneic at 25, tachycardic at 138, BP 130/116, SPO2 80% on nonrebreather at 15 L.  Patient was worked up for sepsis on arrival, receiving 2.5 liters with azithromycin and cefepime for CAP coverage.  Patient ultimately requiring BiPAP & vasopressor support. Significant labs: Procalcitonin elevated at 99.28, BNP elevated at 1149.5, lactic acidosis 7.4, hypokalemia at 2.9 & AKI: BUN 35/creatinine 3.05 elevated from 1.05 in February 2022. PCCM consulted for admission due to vasopressor administration & BiPAP support.  Pertinent  Medical History  COPD on chronic 2 L nasal cannula Type 2 diabetes mellitus Chronic back pain on Suboxone Hypertension Hyperlipidemia Left upper lobe cancer status post radiation Polysubstance abuse AAA  Significant Hospital Events: Including procedures, antibiotic start and stop dates in addition to other pertinent events    02/26/2021 -admit to ICU due to septic shock requiring vasopressor administration and BiPAP support.  02/27/2021- Left femoral CVC placed due to high dose pressors. Tolerating BiPAP, VBG improving. ABX changed to Vancomycin & Zosyn due to worsening lactic acidosis  02/28/2021- Fluids stopped overnight due to "sounding wet", CXR without interim change.  Vasopressin weaned down, decreasing Levophed  requirements, lactic acidosis resolved  Cultures:  3/21: SARS-CoV-2 PCR>>negative 3/21: Influenza PCR>>negative 3/21: Blood culture x2>> 3/22: Strep pneumo urinary antigen>>negative 3/22: Legionella urinary antigen>> 3/22: Mycoplasma Pneumoniae antibody>> 3/22: MRSA PCR>>positive 3/23: Sputum>>  Antimicrobials:  Azithromycin 3/21>>3/22 Cefepime 3/21>>3/22 Vancomycin 3/21 x1 dose, 3/22>> Zosyn 3/22>>  Speech therapist consulted on 03/02/2021, swallow evaluation revealed mild aspiration risk with recommendation for mechanical soft diet with meat minced with gravy, thin liquids via cup, 100% supervision and support feeding at meals.  Home meds with pure versus need for crushed  [name] assumed care on 03/02/2021.  He was assessed by PT OT with recommendation for SNF however patient has declined going to SNF and wants to return home with home health services.  Patient was seen by speech therapist due to dysphagia.  He is at high risk for aspiration but has declined MBS or nectar thick diet if recommended.  Per speech therapist, he is at high risk for recurrent aspiration.  Palliative care team has been consulted to assist with establishing goals of care.  Subjective: 03/06/21: Patient was seen and examined at his bedside.  States he feels better today.  Switched to oral antibiotics, azithromycin 500 mg daily x3 days, completed 8 days of IV Zosyn.  Persistent leukocytosis this morning, procalcitonin 3.76.  Assessment/Plan: Active Problems:   Acute respiratory failure with hypoxia (HCC)   Septic shock (HCC)  Septic shock, shock has resolved, he is no longer on vasopressor, secondary to right lower lobe pneumonia. Currently off IV vasopressors, however he is on midodrine 5 mg 3 times daily. Maintaining his MAP greater than 65. Switched to oral antibiotics, azithromycin 500 mg daily x3 days, completed 8 days of IV Zosyn.  Persistent leukocytosis  this morning, procalcitonin 3.76. MRSA  screening test positive on 02/27/2021, has received IV vancomycin. Sputum culture, few yeast. Procalcitonin downtrending, 5 from 25 from greater than 150 on 02/28/2021. Continue to monitor fever curve and WBC. CBC and procalcitonin in the morning.  Acute hypoxic hypercarbic respiratory failure secondary to right lower lobe pneumonia, aspiration pneumonitis, and pulmonary edema. Maintain O2 saturation between 88 and 94% as recommended by pulmonary. Continue to wean off oxygen supplementation as tolerated. At baseline he is on 2 L nasal cannula  Dysphagia with concern for aspiration Patient was seen by speech therapist due to dysphagia.  He is at high risk for aspiration but has declined MBS or nectar thick diet if recommended.  Per speech therapist, he is at high risk for recurrent aspiration.  Palliative care team has been consulted to assist with establishing goals of care. He is currently on dysphagia 3 diet.  Acute on chronic diastolic CHF Last 2D echo done on 02/28/2021 showed normal LVEF 55 to 60% with grade 2 diastolic dysfunction. BNP uprising greater than 2400 from 1100 on 02/26/2021 Continue strict I's and O's and daily weights Off diuretics due to recent septic shock requiring vasopressors Net I&O -3.2 L  Resolved nonoliguric AKI Baseline creatinine 0.9 with GFR greater than 60. Presented with creatinine of 3.05 with GFR 22. Creatinine is currently back to baseline. Continue to avoid nephrotoxic agents Continue to monitor urine output Good urine output. Repeat renal panel in the morning.  GERD Reported burning pain after eating his breakfast this morning GI cocktail ordered Continue p.o. Protonix 40 mg daily.  History of lacunar CVA Head CT done on 01/29/2021, ordered after unwitnessed fall revealed: Small chronic bilateral basal ganglia lacunar infarcts. States he has been dropping things unintentionally from his hands. Continue aspirin Plavix and Zetia.  Resolved  atypical chest pain, reproducible on palpation. Troponin elevated, peaked at 56 and trended down. Continue to monitor on telemetry.  Resolved post repletion: Refractory hypokalemia Serum potassium 3.0> 4.1>3.0> 3.4> 4.7. Repleted orally  Resolved post repletion: Refractory hypomagnesemia Serum magnesium 1.1> 1.9> 1.4> 2.3. Repleted intravenously  Resolved post repletion: Hypophosphatemia Serum phosphorus 1.4> 1.6> 2.4> 4.4. Repleted orally.  Chronic back pain on Suboxone PTA Normally on Suboxone at home as well as gabapentin. Reduce home dose of gabapentin, he was taking 800 mg 4 times a day. Dose reduced to 200 mg 4 times a day as he was getting too drowsy on higher doses. UDS positive for benzodiazepine and opiates.   TOC will provide resources prior to DC.  Essential hypertension, BPs are currently soft. Recently off vasopressors due to septic shock.  Continue to hold off home oral antihypertensives. He is currently on midodrine 5 mg 3 times daily to maintain MAP greater than 65, continue. Continue Toprol-XL 12.5 mg daily to avoid beta-blockade withdrawal.  Impaired glucose tolerance He is on insulin sliding scale. Hemoglobin A1c 5.5 on 01/30/2021 Avoid hypoglycemia.  Physical debility PT OT recommending SNF Patient has declined SNF and wants to return to home. Continue fall precautions.    Code Status: Full code.  Family Communication: Updated his daughter via phone  Disposition Plan: Likely will discharge to home once hemodynamically stable and on oxygen requirement has improved..   Consultants:  Pulmonary  Procedures:  None.  Antimicrobials:  IV vancomycin and Zosyn, DC'd on 03/06/2021  P.o. azithromycin 500 mg daily x3 days, started on 03/06/2021.  DVT prophylaxis: SQ heparin 3 times daily.  Status is: Inpatient    Dispo:  Patient  From: Home  Planned Disposition: Home with home health services.  Medically stable for discharge: No, ongoing  management of sepsis secondary to right lower lobe pneumonia.  Shock has resolved, now off vasopressors.          Objective: Vitals:   03/06/21 0751 03/06/21 0755 03/06/21 1133 03/06/21 1324  BP:  134/76 126/72   Pulse:  77 87   Resp:  18 18   Temp:  (!) 97.5 F (36.4 C) 99 F (37.2 C)   TempSrc:  Oral    SpO2: 90% 97% 94% 93%  Weight:      Height:        Intake/Output Summary (Last 24 hours) at 03/06/2021 1613 Last data filed at 03/06/2021 1358 Gross per 24 hour  Intake 468.53 ml  Output 1600 ml  Net -1131.47 ml   Filed Weights   03/03/21 0450 03/04/21 0500 03/06/21 0616  Weight: 81.1 kg 81.2 kg 74.8 kg    Exam:  . General: 65 y.o. year-old male chronically ill-appearing no acute stress.  He is alert and oriented x3.   . Cardiovascular: Regular rate and rhythm no rubs or gallops. [name] Kitchen Respiratory: Mild rales bases no wheezing noted.  Poor respiratory effort. . Abdomen: Soft nontender normal bowel sounds present. . Musculoskeletal: Trace lower extremity edema bilaterally. . Skin: No ulcerative lesions noted.   [name] Kitchen Psychiatry: Mood is appropriate for condition setting.   Data Reviewed: CBC: Recent Labs  Lab 03/02/21 0444 03/03/21 0428 03/04/21 0005 03/05/21 0553 03/06/21 0502  WBC 29.7* 24.5* 13.6* 18.0* 15.3*  NEUTROABS 27.9* 21.2* 10.7* 15.2* 12.5*  HGB 9.6* 10.2* 9.6* 9.2* 9.5*  HCT 28.9* 32.4* 29.4* 28.7* 28.5*  MCV 91.7 94.2 92.2 92.9 91.3  PLT 145* 170 164 248 128   Basic Metabolic Panel: Recent Labs  Lab 03/02/21 0444 03/02/21 1354 03/03/21 0428 03/03/21 1602 03/04/21 0005 03/05/21 0553 03/06/21 0502  NA 140  --  141  --  132* 135 135  K 3.0* 3.1* 4.1  --  3.0* 3.4* 4.7  CL 105  --  100  --  93* 97* 97*  CO2 26  --  28  --  28 29 30   GLUCOSE 121*  --  79  --  144* 105* 102*  BUN 34*  --  25*  --  20 17 13   CREATININE 1.23  --  1.16  --  1.06 1.05 0.98  CALCIUM 8.4*  --  8.5*  --  8.2* 8.4* 8.8*  MG 1.7  --  1.1* 2.4 1.9 1.3* 2.3  PHOS  1.4*  --  1.6* 1.3* 2.4* 3.8 4.4   GFR: Estimated Creatinine Clearance: 74.1 mL/min (by C-G formula based on SCr of 0.98 mg/dL). Liver Function Tests: No results for input(s): AST, ALT, ALKPHOS, BILITOT, PROT, ALBUMIN in the last 168 hours. No results for input(s): LIPASE, AMYLASE in the last 168 hours. No results for input(s): AMMONIA in the last 168 hours. Coagulation Profile: No results for input(s): INR, PROTIME in the last 168 hours. Cardiac Enzymes: No results for input(s): CKTOTAL, CKMB, CKMBINDEX, TROPONINI in the last 168 hours. BNP (last 3 results) No results for input(s): PROBNP in the last 8760 hours. HbA1C: No results for input(s): HGBA1C in the last 72 hours. CBG: Recent Labs  Lab 03/05/21 1131 03/05/21 1634 03/05/21 2031 03/06/21 0756 03/06/21 1135  GLUCAP 171* 131* 121* 106* 156*   Lipid Profile: No results for input(s): CHOL, HDL, LDLCALC, TRIG, CHOLHDL, LDLDIRECT in the last 72 hours.  Thyroid Function Tests: No results for input(s): TSH, T4TOTAL, FREET4, T3FREE, THYROIDAB in the last 72 hours. Anemia Panel: No results for input(s): VITAMINB12, FOLATE, FERRITIN, TIBC, IRON, RETICCTPCT in the last 72 hours. Urine analysis:    Component Value Date/Time   COLORURINE YELLOW (A) 02/27/2021 0330   APPEARANCEUR HAZY (A) 02/27/2021 0330   APPEARANCEUR Clear 03/24/2013 1853   LABSPEC 1.017 02/27/2021 0330   LABSPEC 1.020 03/24/2013 1853   PHURINE 5.0 02/27/2021 0330   GLUCOSEU 150 (A) 02/27/2021 0330   GLUCOSEU Negative 03/24/2013 1853   HGBUR LARGE (A) 02/27/2021 0330   BILIRUBINUR NEGATIVE 02/27/2021 0330   BILIRUBINUR Negative 03/24/2013 1853   KETONESUR NEGATIVE 02/27/2021 0330   PROTEINUR 30 (A) 02/27/2021 0330   NITRITE NEGATIVE 02/27/2021 0330   LEUKOCYTESUR NEGATIVE 02/27/2021 0330   LEUKOCYTESUR Negative 03/24/2013 1853   Sepsis Labs: @LABRCNTIP (procalcitonin:4,lacticidven:4)  ) Recent Results (from the past 240 hour(s))  Resp Panel by RT-PCR  (Flu A&B, Covid) Nasopharyngeal Swab     Status: None   Collection Time: 02/26/21  7:11 PM   Specimen: Nasopharyngeal Swab; Nasopharyngeal(NP) swabs in vial transport medium  Result Value Ref Range Status   SARS Coronavirus 2 by RT PCR NEGATIVE NEGATIVE Final    Comment: (NOTE) SARS-CoV-2 target nucleic acids are NOT DETECTED.  The SARS-CoV-2 RNA is generally detectable in upper respiratory specimens during the acute phase of infection. The lowest concentration of SARS-CoV-2 viral copies this assay can detect is 138 copies/mL. A negative result does not preclude SARS-Cov-2 infection and should not be used as the sole basis for treatment or other patient management decisions. A negative result may occur with  improper specimen collection/handling, submission of specimen other than nasopharyngeal swab, presence of viral mutation(s) within the areas targeted by this assay, and inadequate number of viral copies(<138 copies/mL). A negative result must be combined with clinical observations, patient history, and epidemiological information. The expected result is Negative.  Fact Sheet for Patients:  EntrepreneurPulse.com.au  Fact Sheet for Healthcare Providers:  IncredibleEmployment.be  This test is no t yet approved or cleared by the [place] FDA and  has been authorized for detection and/or diagnosis of SARS-CoV-2 by FDA under an Emergency Use Authorization (EUA). This EUA will remain  in effect (meaning this test can be used) for the duration of the COVID-19 declaration under Section 564(b)(1) of the Act, 21 U.S.C.section 360bbb-3(b)(1), unless the authorization is terminated  or revoked sooner.       Influenza A by PCR NEGATIVE NEGATIVE Final   Influenza B by PCR NEGATIVE NEGATIVE Final    Comment: (NOTE) The Xpert Xpress SARS-CoV-2/FLU/RSV plus assay is intended as an aid in the diagnosis of influenza from Nasopharyngeal swab specimens  and should not be used as a sole basis for treatment. Nasal washings and aspirates are unacceptable for Xpert Xpress SARS-CoV-2/FLU/RSV testing.  Fact Sheet for Patients: EntrepreneurPulse.com.au  Fact Sheet for Healthcare Providers: IncredibleEmployment.be  This test is not yet approved or cleared by the [place] FDA and has been authorized for detection and/or diagnosis of SARS-CoV-2 by FDA under an Emergency Use Authorization (EUA). This EUA will remain in effect (meaning this test can be used) for the duration of the COVID-19 declaration under Section 564(b)(1) of the Act, 21 U.S.C. section 360bbb-3(b)(1), unless the authorization is terminated or revoked.  Performed at [facility], [address], [place], [place] [address]   Blood culture (routine x 2)     Status: None   Collection Time: 02/26/21  7:11 PM   Specimen: BLOOD  Result Value Ref Range Status   Specimen Description BLOOD BLOOD RIGHT ARM  Final   Special Requests   Final    BOTTLES DRAWN AEROBIC AND ANAEROBIC Blood Culture adequate volume   Culture   Final    NO GROWTH 5 DAYS Performed at [facility], [address] [place], [place] [address]    Report Status 03/03/2021 FINAL  Final  Blood culture (routine x 2)     Status: None   Collection Time: 02/26/21  7:11 PM   Specimen: BLOOD  Result Value Ref Range Status   Specimen Description BLOOD BLOOD LEFT ARM  Final   Special Requests   Final    BOTTLES DRAWN AEROBIC AND ANAEROBIC Blood Culture adequate volume   Culture   Final    NO GROWTH 5 DAYS Performed at [facility], [address], [place], [place] [address]    Report Status 03/03/2021 FINAL  Final  MRSA PCR Screening     Status: Abnormal   Collection Time: 02/27/21 10:58 PM   Specimen: Nasopharyngeal  Result Value Ref Range Status   MRSA by PCR POSITIVE (A) NEGATIVE Final    Comment:        The GeneXpert MRSA Assay  (FDA approved for NASAL specimens only), is one component of a comprehensive MRSA colonization surveillance program. It is not intended to diagnose MRSA infection nor to guide or monitor treatment for MRSA infections. RESULT CALLED TO, READ BACK BY AND VERIFIED WITH: CICI VANN'BRAY @0047  ON 02/28/21 SKL Performed at [facility], [place], [place] [address]   Expectorated Sputum Assessment w Gram Stain, Rflx to Resp Cult     Status: None   Collection Time: 02/28/21  5:32 PM   Specimen: Expectorated Sputum  Result Value Ref Range Status   Specimen Description EXPECTORATED SPUTUM  Final   Special Requests NONE  Final   Sputum evaluation   Final    Sputum specimen not acceptable for testing.  Please recollect.   RESULT CALLED TO, READ BACK BY AND VERIFIED WITH: CHARLIE FOEEPWOOD AT [place] ON 02/28/21 BY SS Performed at [facility], [address] [place], [place] [address]    Report Status 02/28/2021 FINAL  Final  Expectorated Sputum Assessment w Gram Stain, Rflx to Resp Cult     Status: None   Collection Time: 03/03/21  6:46 AM   Specimen: Sputum  Result Value Ref Range Status   Specimen Description SPUTUM  Final   Special Requests NONE  Final   Sputum evaluation   Final    THIS SPECIMEN IS ACCEPTABLE FOR SPUTUM CULTURE Performed at [facility], [address] [place], [place] [address]    Report Status 03/03/2021 FINAL  Final  Culture, Respiratory w Gram Stain     Status: None   Collection Time: 03/03/21  6:46 AM   Specimen: SPU  Result Value Ref Range Status   Specimen Description   Final    SPUTUM Performed at [facility], [address], [place], [place] [address]    Special Requests   Final    NONE Reflexed from [phone] [phone] Performed at [facility], [place], [place] [address]    Gram Stain FEW WBC PRESENT, PREDOMINANTLY PMN FEW YEAST   Final   Culture   Final    FEW Consistent with  normal respiratory flora. No Pseudomonas species isolated Performed at [facility], [address] [place], [place] [address]    Report Status 03/05/2021 FINAL  Final      Studies: No results found.  Scheduled Meds: . allopurinol  300 mg Oral BID  . aspirin EC  81 mg Oral Daily  . azithromycin  500 mg Oral Daily  . budesonide (PULMICORT) nebulizer solution  0.25 mg Nebulization BID  . Chlorhexidine Gluconate Cloth  6 each Topical Daily  . cholecalciferol  1,000 Units Oral Daily  . clopidogrel  75 mg Oral Daily  . DULoxetine  30 mg Oral Daily  . ezetimibe  10 mg Oral Daily  . gabapentin  200 mg Oral QID  . heparin  5,000 Units Subcutaneous Q8H  . HYDROmorphone  2 mg Oral Q6H  . insulin aspart  0-15 Units Subcutaneous TID WC  . insulin aspart  0-5 Units Subcutaneous QHS  . ipratropium-albuterol  3 mL Nebulization Q6H  . mouth rinse  15 mL Mouth Rinse BID  . metoprolol succinate  12.5 mg Oral Daily  . midodrine  5 mg Oral TID WC  . pantoprazole  40 mg Oral Daily  . simvastatin  40 mg Oral Daily  . sodium chloride flush  10-40 mL Intracatheter Q12H    Continuous Infusions:    LOS: 8 days     Kayleen Memos, MD [facility] Pager [phone]  If 7PM-7AM, please contact night-coverage [email or web address] Password [facility] 03/06/2021, 4:13 PM

## 2021-03-07 LAB — CBC WITH DIFFERENTIAL/PLATELET
Abs Immature Granulocytes: 0.17 10*3/uL — ABNORMAL HIGH (ref 0.00–0.07)
Basophils Absolute: 0 10*3/uL (ref 0.0–0.1)
Basophils Relative: 0 %
Eosinophils Absolute: 0.3 10*3/uL (ref 0.0–0.5)
Eosinophils Relative: 2 %
HCT: 29.8 % — ABNORMAL LOW (ref 39.0–52.0)
Hemoglobin: 9.6 g/dL — ABNORMAL LOW (ref 13.0–17.0)
Immature Granulocytes: 1 %
Lymphocytes Relative: 7 %
Lymphs Abs: 1.2 10*3/uL (ref 0.7–4.0)
MCH: 29.4 pg (ref 26.0–34.0)
MCHC: 32.2 g/dL (ref 30.0–36.0)
MCV: 91.4 fL (ref 80.0–100.0)
Monocytes Absolute: 0.9 10*3/uL (ref 0.1–1.0)
Monocytes Relative: 5 %
Neutro Abs: 15.6 10*3/uL — ABNORMAL HIGH (ref 1.7–7.7)
Neutrophils Relative %: 85 %
Platelets: 360 10*3/uL (ref 150–400)
RBC: 3.26 MIL/uL — ABNORMAL LOW (ref 4.22–5.81)
RDW: 14.6 % (ref 11.5–15.5)
WBC: 18.3 10*3/uL — ABNORMAL HIGH (ref 4.0–10.5)
nRBC: 0 % (ref 0.0–0.2)

## 2021-03-07 LAB — BASIC METABOLIC PANEL
Anion gap: 9 (ref 5–15)
BUN: 13 mg/dL (ref 8–23)
CO2: 29 mmol/L (ref 22–32)
Calcium: 9 mg/dL (ref 8.9–10.3)
Chloride: 96 mmol/L — ABNORMAL LOW (ref 98–111)
Creatinine, Ser: 1.03 mg/dL (ref 0.61–1.24)
GFR, Estimated: >60 mL/min (ref >60)
Glucose, Bld: 104 mg/dL — ABNORMAL HIGH (ref 70–99)
Potassium: 4.2 mmol/L (ref 3.5–5.1)
Sodium: 134 mmol/L — ABNORMAL LOW (ref 135–145)

## 2021-03-07 LAB — GLUCOSE, CAPILLARY
Glucose-Capillary: 108 mg/dL — ABNORMAL HIGH (ref 70–99)
Glucose-Capillary: 98 mg/dL (ref 70–99)
Glucose-Capillary: 107 mg/dL — ABNORMAL HIGH (ref 70–99)
Glucose-Capillary: 124 mg/dL — ABNORMAL HIGH (ref 70–99)

## 2021-03-07 LAB — PROCALCITONIN: Procalcitonin: 2.38 ng/mL

## 2021-03-07 LAB — MAGNESIUM: Magnesium: 1.4 mg/dL — ABNORMAL LOW (ref 1.7–2.4)

## 2021-03-07 LAB — PHOSPHORUS: Phosphorus: 4.9 mg/dL — ABNORMAL HIGH (ref 2.5–4.6)

## 2021-03-07 MED ORDER — AMOXICILLIN-POT CLAVULANATE 875-125 MG PO TABS
1.0000 | ORAL_TABLET | Freq: Two times a day (BID) | ORAL | Status: DC
  Administered 2021-03-07 – 2021-03-09 (×4): 1 via ORAL
  Filled 2021-03-07 (×6): qty 1

## 2021-03-07 MED ORDER — MAGNESIUM SULFATE 2 GM/50ML IV SOLN
2.0000 g | Freq: Once | INTRAVENOUS | Status: AC
  Administered 2021-03-07: 2 g via INTRAVENOUS
  Filled 2021-03-07: qty 50

## 2021-03-07 MED ORDER — MIDODRINE HCL 5 MG PO TABS
5.0000 mg | ORAL_TABLET | Freq: Three times a day (TID) | ORAL | Status: DC | PRN
Start: 2021-03-07 — End: 2021-03-09

## 2021-03-07 MED ORDER — FAMOTIDINE 20 MG PO TABS
20.0000 mg | ORAL_TABLET | Freq: Two times a day (BID) | ORAL | Status: DC
  Administered 2021-03-07 – 2021-03-09 (×4): 20 mg via ORAL
  Filled 2021-03-07 (×4): qty 1

## 2021-03-07 NOTE — Progress Notes (Signed)
Physical Therapy Treatment Patient Details Name: Christian Fields MRN: [id number] DOB: 1956/10/15 Today's Date: 03/07/2021    History of Present Illness 65 y.o. male admitted on 02/26/2021 with pneumonia (CAP). Patient presented with SOB x 2 days. Has a PMH of left upper lobe lung cancer and COPD on chronic 2L [place]. Patient was admitted with AKI that is improving. Pharmacy was consulted for vancomycin and Zosyn dosing.    PT Comments    Pt agreeable to OOB activity. Less agitated today, awaiting d/c to daughter's home once medically stable. Daughter present for mobility and transfer to bedside chair with use of RW and CGA for safety. Pt requires multiple rest breaks with minimal exertion. Pt assisted to chair and positioned to comfort. Discussed d/c concerns with pt and pt's daughter.  Both stated they were not concerned with steps to enter home.  Recommended w/c or transport chair due to significant fatigue level.  Continue PT per POC until d/c.   Follow Up Recommendations  SNF;Supervision/Assistance - 24 hour     Equipment Recommendations  Wheelchair (measurements PT);Wheelchair cushion (measurements PT)    Recommendations for Other Services       Precautions / Restrictions Precautions Precautions: Fall Restrictions Weight Bearing Restrictions: No    Mobility  Bed Mobility Overal bed mobility: Needs Assistance Bed Mobility: Supine to Sit Rolling: Supervision   Supine to sit: Supervision;HOB elevated     General bed mobility comments: Sitting EOB x 10+ minutes due to fatigue, no LOB    Transfers Overall transfer level: Needs assistance Equipment used: Rolling walker (2 wheeled) Transfers: Sit to/from Stand Sit to Stand: Min guard Stand pivot transfers: Min guard          Ambulation/Gait             General Gait Details: Declined gait training   Stairs             Wheelchair Mobility    Modified Rankin (Stroke Patients Only)       Balance  Overall balance assessment: Needs assistance Sitting-balance support: Bilateral upper extremity supported Sitting balance-Leahy Scale: Good                                      Cognition Arousal/Alertness: Awake/alert Behavior During Therapy: WFL for tasks assessed/performed Overall Cognitive Status: Within Functional Limits for tasks assessed                                 General Comments:  (Less irritable, agreed to mobility)      Exercises      General Comments        Pertinent Vitals/Pain Pain Assessment: No/denies pain    Home Living                      Prior Function            PT Goals (current goals can now be found in the care plan section) Acute Rehab PT Goals Patient Stated Goal: to go home    Frequency    Min 2X/week      PT Plan      Co-evaluation              AM-PAC PT "6 Clicks" Mobility   Outcome Measure  Help needed turning from your back to your side while  in a flat bed without using bedrails?: A Little Help needed moving from lying on your back to sitting on the side of a flat bed without using bedrails?: A Lot Help needed moving to and from a bed to a chair (including a wheelchair)?: A Lot Help needed standing up from a chair using your arms (e.g., wheelchair or bedside chair)?: A Lot Help needed to walk in hospital room?: A Lot Help needed climbing 3-5 steps with a railing? : Total 6 Click Score: 12    End of Session Equipment Utilized During Treatment: Oxygen Activity Tolerance: Patient limited by lethargy;Patient limited by pain Patient left: in chair;with call bell/phone within reach;with chair alarm set;with family/visitor present Nurse Communication: Mobility status PT Visit Diagnosis: Unsteadiness on feet (R26.81);Difficulty in walking, not elsewhere classified (R26.2);Muscle weakness (generalized) (M62.81)     Time: 6004-5997 PT Time Calculation (min) (ACUTE ONLY): 30  min  Charges:  $Therapeutic Activity: 23-37 mins                     Mikel Cella, PTA   [name] 03/07/2021, 2:25 PM

## 2021-03-07 NOTE — TOC Progression Note (Addendum)
Transition of Care [facility]) - Progression Note    Patient Details  Name: Christian Fields MRN: [id number] Date of Birth: [birth year]  [facility]) CM/SW [place], RN Phone Number: 03/07/2021, 4:18 PM  Clinical Narrative:  Addendum 508pm:  Notified Teresa at [facility], patient can be accepted, daughter amenable. TOC at bedside, patient frustrated as there was no discharge order today.  Still planning to go home with home health.  Requests TOC to call daughter, [name].  TOC contacted daughter [name] via phone.  Daughter states she has spoken to patient about timing of discharge and he understood. Daughter is aware that recommendation continues to be for SNF, and still plans to take patient home, she inquired about patient having home health for nursing.  Message left for MD regarding this request. No further questions from daughter or patient at this time, TOC contact information given.  TOC will follow through discharge.    Expected Discharge Plan: [place] Barriers to Discharge: Continued Medical Work up  Expected Discharge Plan and Services Expected Discharge Plan: [place] Acute Care Choice: Durable Medical Equipment (O2 2L chronic) Living arrangements for the past 2 months: Mobile Home                                       Social Determinants of Health (SDOH) Interventions    Readmission Risk Interventions Readmission Risk Prevention Plan 11/15/2020  Transportation Screening Complete  PCP or Specialist Appt within 3-5 Days Not Complete  Not Complete comments Pending discharge, unit clerk will schedule.  [place] or Home Care Consult Complete  Social Work Consult for [place] Planning/Counseling Complete  Palliative Care Screening Not Applicable  Medication Review Press photographer) Complete  Some recent data might be hidden

## 2021-03-07 NOTE — Progress Notes (Signed)
[facility] Progress Note  Patient: Christian Fields    LEX:[id number]  DOA: 02/26/2021     Date of Service: the patient was seen and examined on 03/07/2021  Chief Complaint  Patient presents with  . 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 Per ED documentation, upon EMS arrival they were unable to obtain SPO2 readings. Patient was placed on a nonrebreather for oxygen support with SPO2 readings in the high 80's. He also received 2 duo nebs in route with EMS. ED course: Initial vitals-99.1, tachypneic at 25, tachycardic at 138, BP 130/116, SPO2 80% on nonrebreather at 15 L. Patient was worked up for sepsis on arrival, receiving 2.5 liters with azithromycin and cefepime for CAP coverage. Patient ultimately requiring BiPAP &vasopressor support. Significant labs: Procalcitonin elevated at 99.28, BNP elevated at 1149.5, lactic acidosis 7.4, hypokalemia at 2.9 & AKI: BUN 35/creatinine 3.05 elevated from 1.05 in February 2022. PCCM consulted for admission due to vasopressor administration & BiPAP support.  Pertinent Medical History  COPD on chronic 2 L nasal cannula Type 2 diabetes mellitus Chronic back pain on Suboxone Hypertension Hyperlipidemia Left upper lobe cancer status post radiation Polysubstance abuse AAA  Significant Hospital Events: Including procedures, antibiotic start and stop dates in addition to other pertinent events    02/26/2021-admit to ICU due to septic shock requiring vasopressor administration and BiPAP support.  02/27/2021- Left femoral CVC placed due to high dose pressors. Tolerating BiPAP, VBG improving. ABX changed to Vancomycin & Zosyn due to worsening lactic acidosis  02/28/2021-Fluids  stopped overnight due to "sounding wet", CXR without interim change. Vasopressin weaned down, decreasing Levophed requirements, lactic acidosis resolved  Cultures:  3/21: SARS-CoV-2 PCR>>negative 3/21: Influenza PCR>>negative 3/21: Blood culture x2>> 3/22: Strep pneumo urinary antigen>>negative 3/22: Legionella urinary antigen>> 3/22: Mycoplasma Pneumoniae antibody>> 3/22: MRSA PCR>>positive 3/23: Sputum>>  Antimicrobials:  Azithromycin 3/21>>3/22 Cefepime 3/21>>3/22 Vancomycin 3/21 x1 dose, 3/22>> Zosyn 3/22>>  Speech therapist consulted on 03/02/2021, swallow evaluation revealed mild aspiration risk with recommendation for mechanical soft diet with meat minced with gravy, thin liquids via cup, 100% supervision and support feeding at meals.  Home meds with pure versus need for crushed  [name] assumed care on 03/02/2021.  He was assessed by PT OT with recommendation for SNF however patient has declined going to SNF and wants to return home with home health services.  Patient was seen by speech therapist due to dysphagia.  He is at high risk for aspiration but has declined MBS or nectar thick diet if recommended.  Per speech therapist, he is at high risk for recurrent aspiration.  Palliative care team has been consulted to assist with establishing goals of care.   Assessment and Plan: Active Problems:   Acute respiratory failure with hypoxia (HCC)   Septic shock (HCC)  Septic shock, shock has resolved, he is no longer on vasopressor, secondary to right lower lobe pneumonia. Currently off IV vasopressors, however he is on midodrine 5 mg 3 times daily. Maintaining his MAP greater than 65. Switched to oral antibiotics, azithromycin 500 mg daily x3 days, completed 8 days of IV Zosyn.  Persistent leukocytosis this morning, procalcitonin 3.76. MRSA screening test positive on 02/27/2021, has received IV vancomycin. Sputum culture, few yeast. Procalcitonin downtrending, 5 from  25 from  greater than 150 on 02/28/2021. Continue to monitor fever curve and WBC. WBC 18 elevated, procalcitonin 2.38 low Repeat CBC tomorrow   Acute hypoxic hypercarbic respiratory failure secondary to right lower lobe pneumonia, aspiration pneumonitis, and pulmonary edema. Maintain O2 saturation between 88 and 94% as recommended by pulmonary. Continue to wean off oxygen supplementation as tolerated. At baseline he is on 2 L nasal cannula  Dysphagia with concern for aspiration Patient was seen by speech therapist due to dysphagia.  He is at high risk for aspiration but has declined MBS or nectar thick diet if recommended.  Per speech therapist, he is at high risk for recurrent aspiration.  Palliative care team has been consulted to assist with establishing goals of care. He is currently on dysphagia 3 diet.  Acute on chronic diastolic CHF Last 2D echo done on 02/28/2021 showed normal LVEF 55 to 60% with grade 2 diastolic dysfunction. BNP uprising greater than 2400 from 1100 on 02/26/2021 Continue strict I's and O's and daily weights Off diuretics due to recent septic shock requiring vasopressors Net I&O -3.2 L  Resolved nonoliguric AKI Baseline creatinine 0.9 with GFR greater than 60. Presented with creatinine of 3.05 with GFR 22. Creatinine is currently back to baseline. Continue to avoid nephrotoxic agents Continue to monitor urine output Good urine output. Repeat renal panel in the morning.  GERD Reported burning pain after eating his breakfast this morning GI cocktail ordered Continue p.o. Protonix 40 mg daily.  History of lacunar CVA Head CT done on 01/29/2021, ordered after unwitnessed fall revealed: Small chronic bilateral basal ganglia lacunar infarcts. States he has been dropping things unintentionally from his hands. Continue aspirin Plavix and Zetia.  Resolved atypical chest pain, reproducible on palpation. Troponin elevated, peaked at 56 and trended down. Continue to  monitor on telemetry.  Resolved post repletion: Refractory hypokalemia Serum potassium 3.0> 4.1>3.0> 3.4> 4.7. Repleted orally   Refractory hypomagnesemia Serum magnesium 1.1> 1.9> 1.4> 2.3--1.4 Repleted intravenously Discontinue PPI which can cause hypomagnesemia, started Pepcid  Resolved post repletion: Hypophosphatemia Serum phosphorus 1.4> 1.6> 2.4> 4.4. Repleted orally.  Chronic back pain on Suboxone PTA Normally on Suboxone at home as well as gabapentin. Reduce home dose of gabapentin, he was taking 800 mg 4 times a day. Dose reduced to 200 mg 4 times a day as he was getting too drowsy on higher doses. UDS positive for benzodiazepine and opiates.   TOC will provide resources prior to DC.  Essential hypertension, BPs are currently soft. Recently off vasopressors due to septic shock.  Continue to hold off home oral antihypertensives. 3/30 changed midodrine 5 mg TID PRN if SBp <100  Continue Toprol-XL 12.5 mg daily to avoid beta-blockade withdrawal.   Impaired glucose tolerance He is on insulin sliding scale. Hemoglobin A1c 5.5 on 01/30/2021 Avoid hypoglycemia.  Physical debility PT OT recommending SNF Patient has declined SNF and wants to return to home. Continue fall precautions.  Body mass index is 24.35 kg/m.   Interventions:      Diet: Dysphagia 3 DVT Prophylaxis: Subcutaneous Heparin    Advance goals of care discussion: Full code  Family Communication: family was present at bedside, at the time of interview.  The pt provided permission to discuss medical plan with the family. Opportunity was given to ask question and all questions were answered satisfactorily.   Disposition:  Pt is from Home, admitted with Resp failur, PNA, still has elevated WBC count, which precludes a safe discharge. Discharge to home, when WBC  count improves.  Subjective: No significant overnight issues, patient feels improvement, patient would like to go home but needs to  stay 1 more day because of elevated WBC count, hypomagnesemia and patient was on midodrine which has been changed to as needed to watch blood pressure today without midodrine.  Physical Exam: General:  alert oriented to time, place, and person.  Appear in mild distress, affect appropriate Eyes: PERRLA ENT: Oral Mucosa Clear, moist  Neck: no JVD,  Cardiovascular: S1 and S2 Present, no Murmur,  Respiratory: good respiratory effort, Bilateral Air entry equal and Decreased, mild Crackles, no wheezes Abdomen: Bowel Sound present, Soft and no tenderness,  Skin: no rashes Extremities: no Pedal edema, no calf tenderness Neurologic: without any new focal findings Gait not checked due to patient safety concerns  Vitals:   03/07/21 0400 03/07/21 0721 03/07/21 1023 03/07/21 1323  BP: 127/73 130/84 132/73 137/84  Pulse: 99 97 94 85  Resp: 16 17 16 16   Temp: 99 F (37.2 C) 99.2 F (37.3 C)  98.2 F (36.8 C)  TempSrc:      SpO2: 90% 93% 94% 94%  Weight:      Height:        Intake/Output Summary (Last 24 hours) at 03/07/2021 1422 Last data filed at 03/07/2021 1014 Gross per 24 hour  Intake 370.37 ml  Output 1800 ml  Net -1429.63 ml   Filed Weights   03/03/21 0450 03/04/21 0500 03/06/21 0616  Weight: 81.1 kg 81.2 kg 74.8 kg    Data Reviewed: I have personally reviewed and interpreted daily labs, tele strips, imagings as discussed above. I reviewed all nursing notes, pharmacy notes, vitals, pertinent old records I have discussed plan of care as described above with RN and patient/family.  CBC: Recent Labs  Lab 03/03/21 0428 03/04/21 0005 03/05/21 0553 03/06/21 0502 03/07/21 0415  WBC 24.5* 13.6* 18.0* 15.3* 18.3*  NEUTROABS 21.2* 10.7* 15.2* 12.5* 15.6*  HGB 10.2* 9.6* 9.2* 9.5* 9.6*  HCT 32.4* 29.4* 28.7* 28.5* 29.8*  MCV 94.2 92.2 92.9 91.3 91.4  PLT 170 164 248 311 401   Basic Metabolic Panel: Recent Labs  Lab 03/03/21 0428 03/03/21 1602 03/04/21 0005 03/05/21 0553  03/06/21 0502 03/07/21 0415  NA 141  --  132* 135 135 134*  K 4.1  --  3.0* 3.4* 4.7 4.2  CL 100  --  93* 97* 97* 96*  CO2 28  --  28 29 30 29   GLUCOSE 79  --  144* 105* 102* 104*  BUN 25*  --  20 17 13 13   CREATININE 1.16  --  1.06 1.05 0.98 1.03  CALCIUM 8.5*  --  8.2* 8.4* 8.8* 9.0  MG 1.1* 2.4 1.9 1.3* 2.3 1.4*  PHOS 1.6* 1.3* 2.4* 3.8 4.4 4.9*    Studies: No results found.  Scheduled Meds: . allopurinol  300 mg Oral BID  . aspirin EC  81 mg Oral Daily  . azithromycin  500 mg Oral Daily  . budesonide (PULMICORT) nebulizer solution  0.25 mg Nebulization BID  . Chlorhexidine Gluconate Cloth  6 each Topical Daily  . cholecalciferol  1,000 Units Oral Daily  . clopidogrel  75 mg Oral Daily  . DULoxetine  30 mg Oral Daily  . ezetimibe  10 mg Oral Daily  . gabapentin  200 mg Oral QID  . heparin  5,000 Units Subcutaneous Q8H  . HYDROmorphone  2 mg Oral Q6H  . insulin aspart  0-15 Units Subcutaneous TID WC  .  insulin aspart  0-5 Units Subcutaneous QHS  . ipratropium-albuterol  3 mL Nebulization Q6H  . mouth rinse  15 mL Mouth Rinse BID  . metoprolol succinate  12.5 mg Oral Daily  . pantoprazole  40 mg Oral Daily  . simvastatin  40 mg Oral Daily  . sodium chloride flush  10-40 mL Intracatheter Q12H   Continuous Infusions: PRN Meds: ALPRAZolam, docusate sodium, midodrine, ondansetron (ZOFRAN) IV, polyethylene glycol, sodium chloride flush  Time spent: 35 minutes  Author: Val Riles. MD [facility] 03/07/2021 2:22 PM  To reach On-call, see care teams to locate the attending and reach out to them via [email or web address]. If 7PM-7AM, please contact night-coverage If you still have difficulty reaching the attending provider, please page the [facility] (Director on Call) for [facility] on amion for assistance.

## 2021-03-07 NOTE — Progress Notes (Signed)
Daily Progress Note   Patient Name: Christian Fields       Date: 03/07/2021 DOB: 11-Jan-1956  Age: 65 y.o. MRN#: [id number] Attending Physician: Val Riles, MD Primary Care Physician: Remi Haggard, FNP Admit Date: 02/26/2021  Reason for Consultation/Follow-up: Establishing goals of care  Subjective: Patient with poor appetite and swallowing. Spoke with daughter. She states he will not really talk with her about his wishes. She states she understands he cannot sustain with his current appetite, and understands aspiration will lead to PNA again. She understands he does not want a feeding tube or a diet modification, and knows that he understands the risks of continuing on without modifications to nutrition. Her questions were answered about hospice should he decide that path.  He is a hospice candidate if he chooses that path.    Length of Stay: 9  Current Medications: Scheduled Meds:  . allopurinol  300 mg Oral BID  . aspirin EC  81 mg Oral Daily  . azithromycin  500 mg Oral Daily  . budesonide (PULMICORT) nebulizer solution  0.25 mg Nebulization BID  . Chlorhexidine Gluconate Cloth  6 each Topical Daily  . cholecalciferol  1,000 Units Oral Daily  . clopidogrel  75 mg Oral Daily  . DULoxetine  30 mg Oral Daily  . ezetimibe  10 mg Oral Daily  . gabapentin  200 mg Oral QID  . heparin  5,000 Units Subcutaneous Q8H  . HYDROmorphone  2 mg Oral Q6H  . insulin aspart  0-15 Units Subcutaneous TID WC  . insulin aspart  0-5 Units Subcutaneous QHS  . ipratropium-albuterol  3 mL Nebulization Q6H  . mouth rinse  15 mL Mouth Rinse BID  . metoprolol succinate  12.5 mg Oral Daily  . pantoprazole  40 mg Oral Daily  . simvastatin  40 mg Oral Daily  . sodium chloride flush  10-40 mL Intracatheter  Q12H    Continuous Infusions:   PRN Meds: ALPRAZolam, docusate sodium, midodrine, ondansetron (ZOFRAN) IV, polyethylene glycol, sodium chloride flush  Physical Exam Pulmonary:     Effort: Pulmonary effort is normal.  Neurological:     Mental Status: He is alert.             Vital Signs: BP 137/84 (BP Location: Left Arm)   Pulse 85   Temp  98.2 F (36.8 C)   Resp 16   Ht 5\' 9"  (1.753 m)   Wt 74.8 kg   SpO2 93%   BMI 24.35 kg/m  SpO2: SpO2: 93 % O2 Device: O2 Device: Nasal Cannula O2 Flow Rate: O2 Flow Rate (L/min): (S) 4 L/min (pt found on 4L, RN covering for pt RN at lunch made aware)  Intake/output summary:   Intake/Output Summary (Last 24 hours) at 03/07/2021 1459 Last data filed at 03/07/2021 1014 Gross per 24 hour  Intake 370.37 ml  Output 1800 ml  Net -1429.63 ml   LBM: Last BM Date: 03/06/21 Baseline Weight: Weight: 74.4 kg Most recent weight: Weight: 74.8 kg         Flowsheet Rows   Flowsheet Row Most Recent Value  Intake Tab   Referral Department Critical care  Unit at Time of Referral ICU  Palliative Care Primary Diagnosis Pulmonary  Date Notified 03/02/21  Palliative Care Type New Palliative care  Reason for referral Clarify Goals of Care  Date of Admission 02/26/21  Date first seen by Palliative Care 03/02/21  # of days Palliative referral response time 0 Day(s)  # of days IP prior to Palliative referral 4  Clinical Assessment   Psychosocial & Spiritual Assessment   Palliative Care Outcomes       Patient Active Problem List   Diagnosis Date Noted  . Septic shock ([place]) 02/26/2021  . Polysubstance (excluding opioids) dependence ([place]) 01/30/2021  . Acute metabolic encephalopathy 98/33/8250  . COPD with acute exacerbation ([place]) 01/29/2021  . Iron deficiency anemia 11/27/2020  . Severe sepsis ([place])   . NSCLC of left lung ([place]) 11/12/2020  . Left leg swelling 04/28/2020  . Confusion   . Acute respiratory failure with hypoxia ([place])  03/26/2020  . Pneumonia of both lower lobes due to infectious organism 03/25/2020  . AKI (acute kidney injury) ([place]) 01/13/2020  . Cellulitis of left leg 01/12/2020  . COVID-19 virus detected 01/12/2020  . Diabetic ulcer of left midfoot associated with type 2 diabetes mellitus, with fat layer exposed ([place]) 01/12/2020  . Panlobular emphysema ([place]) 01/12/2020  . Arthritis 10/29/2019  . Hemorrhoids 10/29/2019  . Senile nuclear sclerosis, bilateral 02/15/2019  . Cellulitis of left upper limb 02/11/2019  . AAA (abdominal aortic aneurysm) without rupture ([place]) 01/15/2019  . Localized swelling, mass and lump, upper limb 12/24/2018  . Pain in femur 12/24/2018  . TIA (transient ischemic attack) 11/18/2018  . Cortical age-related cataract of both eyes 11/02/2018  . Fracture of multiple ribs 04/20/2018  . Overweight (BMI 25.0-29.9) 04/20/2018  . Hypertension 07/08/2017  . Diabetes mellitus ([place]) 07/08/2017  . Hyperlipidemia 07/08/2017  . Tobacco use disorder 07/08/2017  . Atherosclerosis of native arteries of extremity with intermittent claudication ([place]) 07/08/2017  . SOB (shortness of breath) 06/19/2017  . Acute on chronic respiratory failure with hypoxia and hypercapnia ([place]) 12/15/2016  . CAP (community acquired pneumonia) 12/15/2016  . Chronic pain 12/15/2016  . Chronically on opiate therapy 12/15/2016  . History of kidney stones 12/15/2016  . Polypharmacy 12/15/2016  . Somnolence 12/15/2016  . Foot ulcer ([place]) 01/19/2016  . 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 ([place]) 53/97/6734  . Other psychoactive substance use, unspecified with withdrawal, uncomplicated ([place]) 19/37/9024  . Type 2 diabetes mellitus with foot ulcer (CODE) ([place]) 11/27/2015  . Chronic obstructive pulmonary disease, unspecified ([place]) 01/18/2013  . Depressive disorder 01/18/2013  . Hereditary and idiopathic peripheral neuropathy 01/18/2013  . Sleep apnea 01/18/2013  . Low back pain 11/23/2010  .  Acute gouty  arthropathy 07/10/2010  . Problem related to lifestyle 10/29/2004    Palliative Care Assessment & Plan    Recommendations/Plan: Recommend outpatient palliative with transition to hospice when patient is ready.   Code Status:    Code Status Orders  (From admission, onward)         Start     Ordered   02/26/21 2155  Full code  Continuous        02/26/21 2157        Code Status History    Date Active Date Inactive Code Status Order ID Comments User Context   01/29/2021 2351 02/01/2021 1722 Full Code [id number]  Athena Masse, MD ED   11/12/2020 2253 11/18/2020 2110 Full Code [id number]  Neena Rhymes, MD ED   03/26/2020 0136 03/28/2020 2308 Full Code [id number]  Nicolette Bang, DO ED   01/19/2016 2021 01/20/2016 1818 Full Code [id number]  Hillary Bow, MD Inpatient   Advance Care Planning Activity      Prognosis:  < 3 months   Thank you for allowing the Palliative Medicine Team to assist in the care of this patient.   Total Time 15 min Prolonged Time Billed  no      Greater than 50%  of this time was spent counseling and coordinating care related to the above assessment and plan.  Asencion Gowda, NP  Please contact Palliative Medicine Team phone at [phone] for questions and concerns.

## 2021-03-08 DIAGNOSIS — J9621 Acute and chronic respiratory failure with hypoxia: Secondary | ICD-10-CM | POA: Diagnosis present

## 2021-03-08 LAB — GLUCOSE, CAPILLARY
Glucose-Capillary: 120 mg/dL — ABNORMAL HIGH (ref 70–99)
Glucose-Capillary: 95 mg/dL (ref 70–99)
Glucose-Capillary: 96 mg/dL (ref 70–99)
Glucose-Capillary: 96 mg/dL (ref 70–99)

## 2021-03-08 LAB — BLOOD GAS, ARTERIAL
Acid-Base Excess: 4.4 mmol/L — ABNORMAL HIGH (ref 0.0–2.0)
Bicarbonate: 28.4 mmol/L — ABNORMAL HIGH (ref 20.0–28.0)
FIO2: 0.36
O2 Saturation: 91.9 %
Patient temperature: 37
pCO2 arterial: 39 mmHg (ref 32.0–48.0)
pH, Arterial: 7.47 — ABNORMAL HIGH (ref 7.350–7.450)
pO2, Arterial: 59 mmHg — ABNORMAL LOW (ref 83.0–108.0)

## 2021-03-08 LAB — CBC WITH DIFFERENTIAL/PLATELET
Abs Immature Granulocytes: 0.14 10*3/uL — ABNORMAL HIGH (ref 0.00–0.07)
Basophils Absolute: 0.1 10*3/uL (ref 0.0–0.1)
Basophils Relative: 0 %
Eosinophils Absolute: 0.2 10*3/uL (ref 0.0–0.5)
Eosinophils Relative: 1 %
HCT: 29.4 % — ABNORMAL LOW (ref 39.0–52.0)
Hemoglobin: 9.6 g/dL — ABNORMAL LOW (ref 13.0–17.0)
Immature Granulocytes: 1 %
Lymphocytes Relative: 7 %
Lymphs Abs: 1.1 10*3/uL (ref 0.7–4.0)
MCH: 29.7 pg (ref 26.0–34.0)
MCHC: 32.7 g/dL (ref 30.0–36.0)
MCV: 91 fL (ref 80.0–100.0)
Monocytes Absolute: 1 10*3/uL (ref 0.1–1.0)
Monocytes Relative: 6 %
Neutro Abs: 13 10*3/uL — ABNORMAL HIGH (ref 1.7–7.7)
Neutrophils Relative %: 85 %
Platelets: 394 10*3/uL (ref 150–400)
RBC: 3.23 MIL/uL — ABNORMAL LOW (ref 4.22–5.81)
RDW: 14.8 % (ref 11.5–15.5)
WBC: 15.5 10*3/uL — ABNORMAL HIGH (ref 4.0–10.5)
nRBC: 0 % (ref 0.0–0.2)

## 2021-03-08 LAB — BASIC METABOLIC PANEL
Anion gap: 12 (ref 5–15)
BUN: 12 mg/dL (ref 8–23)
CO2: 27 mmol/L (ref 22–32)
Calcium: 9.1 mg/dL (ref 8.9–10.3)
Chloride: 94 mmol/L — ABNORMAL LOW (ref 98–111)
Creatinine, Ser: 1.04 mg/dL (ref 0.61–1.24)
GFR, Estimated: >60 mL/min (ref >60)
Glucose, Bld: 102 mg/dL — ABNORMAL HIGH (ref 70–99)
Potassium: 4 mmol/L (ref 3.5–5.1)
Sodium: 133 mmol/L — ABNORMAL LOW (ref 135–145)

## 2021-03-08 LAB — MAGNESIUM: Magnesium: 1.6 mg/dL — ABNORMAL LOW (ref 1.7–2.4)

## 2021-03-08 LAB — PHOSPHORUS: Phosphorus: 4.7 mg/dL — ABNORMAL HIGH (ref 2.5–4.6)

## 2021-03-08 MED ORDER — ORAL CARE MOUTH RINSE: 15.0000 mL | Freq: Two times a day (BID) | OROMUCOSAL | Status: DC

## 2021-03-08 MED ORDER — MAGNESIUM OXIDE 400 (241.3 MG) MG PO TABS
400.0000 mg | ORAL_TABLET | Freq: Two times a day (BID) | ORAL | Status: DC
  Administered 2021-03-09: 400 mg via ORAL
  Filled 2021-03-08: qty 1

## 2021-03-08 MED ORDER — MAGNESIUM SULFATE 2 GM/50ML IV SOLN
2.0000 g | Freq: Once | INTRAVENOUS | Status: AC
  Administered 2021-03-08: 2 g via INTRAVENOUS
  Filled 2021-03-08: qty 50

## 2021-03-08 NOTE — Plan of Care (Signed)

## 2021-03-08 NOTE — Progress Notes (Signed)
PT Cancellation Note  Patient Details Name: Christian Fields MRN: [id number] DOB: 08-20-1956   Cancelled Treatment:    Reason Eval/Treat Not Completed: Medical issues which prohibited therapy: Pt transferred to higher level of care.  Per protocol will complete PT orders at this time but will reassess pt upon receipt of new PT orders.     Linus Salmons PT, DPT 03/08/21, 12:04 PM

## 2021-03-08 NOTE — Progress Notes (Signed)
Pt sleeping - bipap in place - sats 100% and pt more responsive - , eyes open in response to name being called and speaks briefly with daughter who is at bedside.  Report has been called to Randall Hiss who will receive pt to ICU 15.

## 2021-03-08 NOTE — Progress Notes (Addendum)
[facility] Progress Note  Patient: Christian Fields    YKD:[id number]  DOA: 02/26/2021     Date of Service: the patient was seen and examined on 03/08/2021  Chief Complaint  Patient presents with  . 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 Per ED documentation, upon EMS arrival they were unable to obtain SPO2 readings. Patient was placed on a nonrebreather for oxygen support with SPO2 readings in the high 80's. He also received 2 duo nebs in route with EMS. ED course: Initial vitals-99.1, tachypneic at 25, tachycardic at 138, BP 130/116, SPO2 80% on nonrebreather at 15 L. Patient was worked up for sepsis on arrival, receiving 2.5 liters with azithromycin and cefepime for CAP coverage. Patient ultimately requiring BiPAP &vasopressor support. Significant labs: Procalcitonin elevated at 99.28, BNP elevated at 1149.5, lactic acidosis 7.4, hypokalemia at 2.9 & AKI: BUN 35/creatinine 3.05 elevated from 1.05 in February 2022. PCCM consulted for admission due to vasopressor administration & BiPAP support.  Pertinent Medical History  COPD on chronic 2 L nasal cannula Type 2 diabetes mellitus Chronic back pain on Suboxone Hypertension Hyperlipidemia Left upper lobe cancer status post radiation Polysubstance abuse AAA  Significant Hospital Events: Including procedures, antibiotic start and stop dates in addition to other pertinent events    02/26/2021-admit to ICU due to septic shock requiring vasopressor administration and BiPAP support.  02/27/2021- Left femoral CVC placed due to high dose pressors. Tolerating BiPAP, VBG improving. ABX changed to Vancomycin & Zosyn due to worsening lactic acidosis  02/28/2021-Fluids  stopped overnight due to "sounding wet", CXR without interim change. Vasopressin weaned down, decreasing Levophed requirements, lactic acidosis resolved  Cultures:  3/21: SARS-CoV-2 PCR>>negative 3/21: Influenza PCR>>negative 3/21: Blood culture x2>> 3/22: Strep pneumo urinary antigen>>negative 3/22: Legionella urinary antigen>> 3/22: Mycoplasma Pneumoniae antibody>> 3/22: MRSA PCR>>positive 3/23: Sputum>>  Antimicrobials:  Azithromycin 3/21>>3/22 Cefepime 3/21>>3/22 Vancomycin 3/21 x1 dose, 3/22>> Zosyn 3/22>>  Speech therapist consulted on 03/02/2021, swallow evaluation revealed mild aspiration risk with recommendation for mechanical soft diet with meat minced with gravy, thin liquids via cup, 100% supervision and support feeding at meals.  Home meds with pure versus need for crushed  [name] assumed care on 03/02/2021.  He was assessed by PT OT with recommendation for SNF however patient has declined going to SNF and wants to return home with home health services.  Patient was seen by speech therapist due to dysphagia.  He is at high risk for aspiration but has declined MBS or nectar thick diet if recommended.  Per speech therapist, he is at high risk for recurrent aspiration.  Palliative care team has been consulted to assist with establishing goals of care.   Assessment and Plan: Active Problems:   Acute respiratory failure with hypoxia (HCC)   Septic shock (HCC)  Septic shock, shock has resolved, he is no longer on vasopressor, secondary to right lower lobe pneumonia. Currently off IV vasopressors, however he is on midodrine 5 mg 3 times daily. Maintaining his MAP greater than 65. Switched to oral antibiotics, azithromycin 500 mg daily x3 days, completed 8 days of IV Zosyn.  Persistent leukocytosis this morning, procalcitonin 3.76. MRSA screening test positive on 02/27/2021, has received IV vancomycin. Sputum culture, few yeast. Procalcitonin downtrending, 5 from  25 from  greater than 150 on 02/28/2021. Continue to monitor fever curve and WBC. WBC 18--15.5 elevated, procalcitonin 2.38 low Repeat CBC tomorrow   Acute on chronic hypoxic hypercarbic respiratory failure secondary to right lower lobe pneumonia, aspiration pneumonitis, and pulmonary edema and COPD exacerbation Maintain O2 saturation between 88 and 94% as recommended by pulmonary. Continue to wean off oxygen supplementation as tolerated. At baseline he is on 2 L nasal cannula 3/31 patient's O2 saturation dropped to 64% on 4 L, unable to arouse, patient was placed on a BiPAP and transferred to ICU ABG 7.4 7/39/59/20 8.4/92% We will try to arrange trilogy for home use before discharge   Dysphagia with concern for aspiration Patient was seen by speech therapist due to dysphagia.  He is at high risk for aspiration but has declined MBS or nectar thick diet if recommended.  Per speech therapist, he is at high risk for recurrent aspiration.  Palliative care team has been consulted to assist with establishing goals of care. He is currently on dysphagia 3 diet.  Acute on chronic diastolic CHF Last 2D echo done on 02/28/2021 showed normal LVEF 55 to 60% with grade 2 diastolic dysfunction. BNP uprising greater than 2400 from 1100 on 02/26/2021 Continue strict I's and O's and daily weights Off diuretics due to recent septic shock requiring vasopressors Net I&O -3.2 L  Resolved nonoliguric AKI Baseline creatinine 0.9 with GFR greater than 60. Presented with creatinine of 3.05 with GFR 22. Creatinine is currently back to baseline. Continue to avoid nephrotoxic agents Continue to monitor urine output Good urine output. Repeat renal panel in the morning.  GERD Reported burning pain after eating his breakfast this morning GI cocktail ordered Continue p.o. Protonix 40 mg daily.  History of lacunar CVA Head CT done on 01/29/2021, ordered after unwitnessed fall revealed: Small chronic bilateral basal  ganglia lacunar infarcts. States he has been dropping things unintentionally from his hands. Continue aspirin Plavix and Zetia.  Resolved atypical chest pain, reproducible on palpation. Troponin elevated, peaked at 56 and trended down. Continue to monitor on telemetry.  Resolved post repletion: Refractory hypokalemia Serum potassium 3.0> 4.1>3.0> 3.4> 4.7. Repleted orally   Refractory hypomagnesemia Serum magnesium 1.1> 1.9> 1.4> 2.3--1.4--1.6 Repleted intravenously Discontinue PPI which can cause hypomagnesemia, started Pepcid  Resolved post repletion: Hypophosphatemia Serum phosphorus 1.4> 1.6> 2.4> 4.4. Repleted orally.  Chronic back pain on Suboxone PTA Normally on Suboxone at home as well as gabapentin. Reduce home dose of gabapentin, he was taking 800 mg 4 times a day. Dose reduced to 200 mg 4 times a day as he was getting too drowsy on higher doses. UDS positive for benzodiazepine and opiates.   TOC will provide resources prior to DC.  Essential hypertension, BPs are currently soft. Recently off vasopressors due to septic shock.  Continue to hold off home oral antihypertensives. 3/30 changed midodrine 5 mg TID PRN if SBp <100  Continue Toprol-XL 12.5 mg daily to avoid beta-blockade withdrawal.   Impaired glucose tolerance He is on insulin sliding scale. Hemoglobin A1c 5.5 on 01/30/2021 Avoid hypoglycemia.  Physical debility PT OT recommending SNF Patient has declined SNF and wants to return to home. Continue fall precautions.  Body mass index is 24.35 kg/m.   Interventions:      Diet: Dysphagia 3 DVT Prophylaxis: Subcutaneous Heparin    Advance goals of care discussion: Full code  Family Communication: family was present at bedside, at the time of interview.  The pt provided permission to discuss medical  plan with the family. Opportunity was given to ask question and all questions were answered satisfactorily.  3/31 today family was not at  bedside, patient was transferred to ICU due to hypoxic respiratory failure during sleep   Disposition:  Pt is from Home, admitted with Resp failur, PNA, still has elevated WBC count, developed respiratory failure O2 saturation 64% on 4 L today, patient was placed on the BiPAP and transferred to ICU which precludes a safe discharge. Discharge to home, when respiratory failure will improve most likely in 1 to 2 days  Patient will need BiPAP on discharge  Subjective: No significant overnight issues, patient feels improvement, patient would like to go home but needs to stay 1 more day because of elevated WBC count, hypomagnesemia and patient was on midodrine which has been changed to as needed to watch blood pressure today without midodrine.  Physical Exam: General:  Pt was on Bipap today Appear in mild distress due to being on BiPAP, patient wanted to go home. Eyes: PERRLA ENT: Oral Mucosa Clear, moist  Neck: no JVD,  Cardiovascular: S1 and S2 Present, no Murmur,  Respiratory: good respiratory effort, Bilateral Air entry equal and Decreased, mild Crackles, no wheezes Abdomen: Bowel Sound present, Soft and no tenderness,  Skin: no rashes Extremities: no Pedal edema, no calf tenderness Neurologic: without any new focal findings Gait not checked due to patient safety concerns  Vitals:   03/08/21 1121 03/08/21 1200 03/08/21 1300 03/08/21 1400  BP: 122/83 123/76 (!) 114/91 129/89  Pulse:  95 94 98  Resp:  (!) 25 (!) 28 16  Temp: 98 F (36.7 C)     TempSrc: Oral     SpO2:  93% 97% 90%  Weight: 75.3 kg     Height: 5\' 8"  (1.727 m)       Intake/Output Summary (Last 24 hours) at 03/08/2021 1547 Last data filed at 03/08/2021 1400 Gross per 24 hour  Intake --  Output 1175 ml  Net -1175 ml   Filed Weights   03/06/21 0616 03/08/21 0449 03/08/21 1121  Weight: 74.8 kg 76.2 kg 75.3 kg    Data Reviewed: I have personally reviewed and interpreted daily labs, tele strips, imagings as  discussed above. I reviewed all nursing notes, pharmacy notes, vitals, pertinent old records I have discussed plan of care as described above with RN and patient/family.  CBC: Recent Labs  Lab 03/04/21 0005 03/05/21 0553 03/06/21 0502 03/07/21 0415 03/08/21 0501  WBC 13.6* 18.0* 15.3* 18.3* 15.5*  NEUTROABS 10.7* 15.2* 12.5* 15.6* 13.0*  HGB 9.6* 9.2* 9.5* 9.6* 9.6*  HCT 29.4* 28.7* 28.5* 29.8* 29.4*  MCV 92.2 92.9 91.3 91.4 91.0  PLT 164 248 311 360 062   Basic Metabolic Panel: Recent Labs  Lab 03/04/21 0005 03/05/21 0553 03/06/21 0502 03/07/21 0415 03/08/21 0501  NA 132* 135 135 134* 133*  K 3.0* 3.4* 4.7 4.2 4.0  CL 93* 97* 97* 96* 94*  CO2 28 29 30 29 27   GLUCOSE 144* 105* 102* 104* 102*  BUN 20 17 13 13 12   CREATININE 1.06 1.05 0.98 1.03 1.04  CALCIUM 8.2* 8.4* 8.8* 9.0 9.1  MG 1.9 1.3* 2.3 1.4* 1.6*  PHOS 2.4* 3.8 4.4 4.9* 4.7*    Studies: No results found.  Scheduled Meds: . allopurinol  300 mg Oral BID  . amoxicillin-clavulanate  1 tablet Oral BID  . aspirin EC  81 mg Oral Daily  . budesonide (PULMICORT) nebulizer solution  0.25 mg Nebulization BID  .  Chlorhexidine Gluconate Cloth  6 each Topical Daily  . cholecalciferol  1,000 Units Oral Daily  . clopidogrel  75 mg Oral Daily  . DULoxetine  30 mg Oral Daily  . ezetimibe  10 mg Oral Daily  . famotidine  20 mg Oral BID  . gabapentin  200 mg Oral QID  . heparin  5,000 Units Subcutaneous Q8H  . insulin aspart  0-15 Units Subcutaneous TID WC  . insulin aspart  0-5 Units Subcutaneous QHS  . ipratropium-albuterol  3 mL Nebulization Q6H  . [START ON 03/09/2021] magnesium oxide  400 mg Oral BID  . mouth rinse  15 mL Mouth Rinse BID  . mouth rinse  15 mL Mouth Rinse BID  . metoprolol succinate  12.5 mg Oral Daily  . simvastatin  40 mg Oral Daily  . sodium chloride flush  10-40 mL Intracatheter Q12H   Continuous Infusions: PRN Meds: ALPRAZolam, docusate sodium, midodrine, ondansetron (ZOFRAN) IV,  polyethylene glycol, sodium chloride flush  Time spent: 35 minutes  Author: Val Riles. MD [facility] 03/08/2021 3:47 PM  To reach On-call, see care teams to locate the attending and reach out to them via [email or web address]. If 7PM-7AM, please contact night-coverage If you still have difficulty reaching the attending provider, please page the [facility] (Director on Call) for [facility] on amion for assistance.

## 2021-03-08 NOTE — Progress Notes (Signed)
OT Cancellation Note  Patient Details Name: Christian Fields MRN: [id number] DOB: Feb 22, 1956   Cancelled Treatment:    Reason Eval/Treat Not Completed: Medical issues which prohibited therapy. Attempted to see pt this AM. Nursing reporting that pt with low SpO2, agitated, and possibly requiring BiPAP at this time. Will attempt at later time as able.  Fredirick Maudlin, OTR/L [place]

## 2021-03-08 NOTE — Progress Notes (Signed)
Patient continues to exhibit signs of hypercapnia associated with chronic respiratory failure secondary to severe COPD. Interruption or failure to provide NIV would quickly lead to exacerbation of the patient's condition, hospital admission, and likely harm to the patient. Continued use is preferred. The use of the NIV will treat patient's high PC02 levels and can reduce risk of exacerbations and future hospitalizations when used at night and during the day. BiLevel/RAD has been considered and ruled out as patient requires continuous alarms, backup battery, and portability which are not possible with BiLevel/RAD devices. Ventilation is required to decrease the work of breathing and improve pulmonary status. Interruption of ventilator support would lead to decline of health status. Patient is able to protect their airways and clear secretions on their own.    

## 2021-03-08 NOTE — Progress Notes (Signed)
02 sats in 68-72% range on 4 lpm - pt confused, slow to arouse and somewhat combative - could not follow instructions for deep breathing/ nose breathing - notified RT/ charge RN and requested pt be place on his bipap at this time.

## 2021-03-08 NOTE — Progress Notes (Signed)
OT Cancellation Note  Patient Details Name: Christian Fields MRN: [id number] DOB: [birth year]   Cancelled Treatment:    Reason Eval/Treat Not Completed: Medical issues which prohibited therapy. Per chart review, pt noted to have transitioned to higher level care. Per therapy protocols, will require new orders or continue at transfer to initiate therapy services. Will follow acutely and initiate services as pt medically appropriate.   Fredirick Maudlin, OTR/L [place]

## 2021-03-08 NOTE — TOC Progression Note (Signed)
Transition of Care [facility]) - Progression Note    Patient Details  Name: Christian Fields MRN: [id number] Date of Birth: 04/16/1956  Transition of Care [facility]) CM/SW [place], RN Phone Number: 03/08/2021, 9:51 AM  Clinical Narrative:   TOC in to see daughter, at patient bedside.  Care team at bedside, state patient will go to ICU.  Daughter stated she had no questions/concerns for TOC at this time.      Expected Discharge Plan: [place] Barriers to Discharge: Continued Medical Work up  Expected Discharge Plan and Services Expected Discharge Plan: [place] Acute Care Choice: Durable Medical Equipment (O2 2L chronic) Living arrangements for the past 2 months: Mobile Home                                       Social Determinants of Health (SDOH) Interventions    Readmission Risk Interventions Readmission Risk Prevention Plan 11/15/2020  Transportation Screening Complete  PCP or Specialist Appt within 3-5 Days Not Complete  Not Complete comments Pending discharge, unit clerk will schedule.  [place] or Home Care Consult Complete  Social Work Consult for [place] Planning/Counseling Complete  Palliative Care Screening Not Applicable  Medication Review Press photographer) Complete  Some recent data might be hidden

## 2021-03-08 NOTE — TOC Progression Note (Signed)
Transition of Care [facility]) - Progression Note    Patient Details  Name: BRALON ANTKOWIAK MRN: [id number] Date of Birth: 11-Jul-1956  Transition of Care [facility]) CM/SW [place], [place] Phone Number: [phone] 03/08/2021, 3:56 PM  Clinical Narrative:     Plan is for patient to d/c tomorrow 03/09/2021, with Tr[facility] supplied by Thedore Mins at [facility] DME and home health PT, OT, RN, [facility] and social work confirmed w/ [facility] home health.    Expected Discharge Plan: [place] Barriers to Discharge: Continued Medical Work up  Expected Discharge Plan and Services Expected Discharge Plan: [place] Acute Care Choice: Durable Medical Equipment (O2 2L chronic) Living arrangements for the past 2 months: Mobile Home                                       Social Determinants of Health (SDOH) Interventions    Readmission Risk Interventions Readmission Risk Prevention Plan 11/15/2020  Transportation Screening Complete  PCP or Specialist Appt within 3-5 Days Not Complete  Not Complete comments Pending discharge, unit clerk will schedule.  [place] or Home Care Consult Complete  Social Work Consult for [place] Planning/Counseling Complete  Palliative Care Screening Not Applicable  Medication Review Press photographer) Complete  Some recent data might be hidden

## 2021-03-08 NOTE — Progress Notes (Addendum)
Daily Progress Note   Patient Name: Christian Fields       Date: 03/08/2021 DOB: August 14, 1956  Age: 65 y.o. MRN#: [id number] Attending Physician: Val Riles, MD Primary Care Physician: Remi Haggard, FNP Admit Date: 02/26/2021  Reason for Consultation/Follow-up: Establishing goals of care  Subjective: Patient is resting in bed at this time on BIPAP. Patient is being moved to ICU. He states he wants to go home. Discussed risk of death if he goes home. He states his lungs are not that bad. Discussed aspiration and how this connects to PNA.   He then stated he would not want to live on a ventilator. Attempted to clarify further but he would not say anything more about the ventilator. He states he does not want to die. Spoke with daughter. Patient has stated he would not want a feeding tube or to have diet modifications. She states he has been resistant to talk with her about goals of care other than to say going home with hospice is "just giving up."     Length of Stay: 10  Current Medications: Scheduled Meds:  . allopurinol  300 mg Oral BID  . amoxicillin-clavulanate  1 tablet Oral BID  . aspirin EC  81 mg Oral Daily  . azithromycin  500 mg Oral Daily  . budesonide (PULMICORT) nebulizer solution  0.25 mg Nebulization BID  . Chlorhexidine Gluconate Cloth  6 each Topical Daily  . cholecalciferol  1,000 Units Oral Daily  . clopidogrel  75 mg Oral Daily  . DULoxetine  30 mg Oral Daily  . ezetimibe  10 mg Oral Daily  . famotidine  20 mg Oral BID  . gabapentin  200 mg Oral QID  . heparin  5,000 Units Subcutaneous Q8H  . insulin aspart  0-15 Units Subcutaneous TID WC  . insulin aspart  0-5 Units Subcutaneous QHS  . ipratropium-albuterol  3 mL Nebulization Q6H  . [START ON 03/09/2021]  magnesium oxide  400 mg Oral BID  . mouth rinse  15 mL Mouth Rinse BID  . mouth rinse  15 mL Mouth Rinse BID  . metoprolol succinate  12.5 mg Oral Daily  . simvastatin  40 mg Oral Daily  . sodium chloride flush  10-40 mL Intracatheter Q12H    Continuous Infusions:   PRN Meds: ALPRAZolam, docusate  sodium, midodrine, ondansetron (ZOFRAN) IV, polyethylene glycol, sodium chloride flush  Physical Exam Pulmonary:     Comments: On ventilator.  Neurological:     Mental Status: He is alert.             Vital Signs: BP 122/83 (BP Location: Left Arm)   Pulse 99   Temp 98 F (36.7 C) (Oral)   Resp 20   Ht 5\' 8"  (1.727 m)   Wt 75.3 kg   SpO2 99%   BMI 25.24 kg/m  SpO2: SpO2: 99 % O2 Device: O2 Device: Nasal Cannula O2 Flow Rate: O2 Flow Rate (L/min): 4 L/min  Intake/output summary:   Intake/Output Summary (Last 24 hours) at 03/08/2021 1151 Last data filed at 03/08/2021 0553 Gross per 24 hour  Intake --  Output 1050 ml  Net -1050 ml   LBM: Last BM Date: 03/06/21 Baseline Weight: Weight: 74.4 kg Most recent weight: Weight: 75.3 kg       Palliative Assessment/Data:    Flowsheet Rows   Flowsheet Row Most Recent Value  Intake Tab   Referral Department Critical care  Unit at Time of Referral ICU  Palliative Care Primary Diagnosis Pulmonary  Date Notified 03/02/21  Palliative Care Type New Palliative care  Reason for referral Clarify Goals of Care  Date of Admission 02/26/21  Date first seen by Palliative Care 03/02/21  # of days Palliative referral response time 0 Day(s)  # of days IP prior to Palliative referral 4  Clinical Assessment   Psychosocial & Spiritual Assessment   Palliative Care Outcomes       Patient Active Problem List   Diagnosis Date Noted  . Septic shock ([place]) 02/26/2021  . Polysubstance (excluding opioids) dependence ([place]) 01/30/2021  . Acute metabolic encephalopathy 84/69/6295  . COPD with acute exacerbation ([place]) 01/29/2021  . Iron  deficiency anemia 11/27/2020  . Severe sepsis ([place])   . NSCLC of left lung ([place]) 11/12/2020  . Left leg swelling 04/28/2020  . Confusion   . Acute respiratory failure with hypoxia ([place]) 03/26/2020  . Pneumonia of both lower lobes due to infectious organism 03/25/2020  . AKI (acute kidney injury) ([place]) 01/13/2020  . Cellulitis of left leg 01/12/2020  . COVID-19 virus detected 01/12/2020  . Diabetic ulcer of left midfoot associated with type 2 diabetes mellitus, with fat layer exposed ([place]) 01/12/2020  . Panlobular emphysema ([place]) 01/12/2020  . Arthritis 10/29/2019  . Hemorrhoids 10/29/2019  . Senile nuclear sclerosis, bilateral 02/15/2019  . Cellulitis of left upper limb 02/11/2019  . AAA (abdominal aortic aneurysm) without rupture ([place]) 01/15/2019  . Localized swelling, mass and lump, upper limb 12/24/2018  . Pain in femur 12/24/2018  . TIA (transient ischemic attack) 11/18/2018  . Cortical age-related cataract of both eyes 11/02/2018  . Fracture of multiple ribs 04/20/2018  . Overweight (BMI 25.0-29.9) 04/20/2018  . Hypertension 07/08/2017  . Diabetes mellitus ([place]) 07/08/2017  . Hyperlipidemia 07/08/2017  . Tobacco use disorder 07/08/2017  . Atherosclerosis of native arteries of extremity with intermittent claudication ([place]) 07/08/2017  . SOB (shortness of breath) 06/19/2017  . Acute on chronic respiratory failure with hypoxia and hypercapnia ([place]) 12/15/2016  . CAP (community acquired pneumonia) 12/15/2016  . Chronic pain 12/15/2016  . Chronically on opiate therapy 12/15/2016  . History of kidney stones 12/15/2016  . Polypharmacy 12/15/2016  . Somnolence 12/15/2016  . Foot ulcer ([place]) 01/19/2016  . 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 Other psychoactive substance use, unspecified with withdrawal, uncomplicated ([place])  11/29/2015  . Type 2 diabetes mellitus with foot ulcer (CODE) ([place]) 11/27/2015  . Chronic obstructive pulmonary disease, unspecified  ([place]) 01/18/2013  . Depressive disorder 01/18/2013  . Hereditary and idiopathic peripheral neuropathy 01/18/2013  . Sleep apnea 01/18/2013  . Low back pain 11/23/2010  . Acute gouty arthropathy 07/10/2010  . Problem related to lifestyle 10/29/2004    Palliative Care Assessment & Plan    Recommendations/Plan: Full code/full scope.     Code Status:    Code Status Orders  (From admission, onward)         Start     Ordered   02/26/21 2155  Full code  Continuous        02/26/21 2157        Code Status History    Date Active Date Inactive Code Status Order ID Comments User Context   01/29/2021 2351 02/01/2021 1722 Full Code [id number]  Athena Masse, MD ED   11/12/2020 2253 11/18/2020 2110 Full Code [id number]  Neena Rhymes, MD ED   03/26/2020 0136 03/28/2020 2308 Full Code [id number]  Nicolette Bang, DO ED   01/19/2016 2021 01/20/2016 1818 Full Code [id number]  Hillary Bow, MD Inpatient   Advance Care Planning Activity      Prognosis:  < 6 months    Care plan was discussed with primary MD.   Thank you for allowing the Palliative Medicine Team to assist in the care of this patient.   Total Time 25 min Prolonged Time Billed  no      Greater than 50%  of this time was spent counseling and coordinating care related to the above assessment and plan.  Asencion Gowda, NP  Please contact Palliative Medicine Team phone at [phone] for questions and concerns.

## 2021-03-09 DIAGNOSIS — J9621 Acute and chronic respiratory failure with hypoxia: Secondary | ICD-10-CM

## 2021-03-09 LAB — CBC WITH DIFFERENTIAL/PLATELET
Abs Immature Granulocytes: 0.14 10*3/uL — ABNORMAL HIGH (ref 0.00–0.07)
Basophils Absolute: 0.1 10*3/uL (ref 0.0–0.1)
Basophils Relative: 0 %
Eosinophils Absolute: 0.2 10*3/uL (ref 0.0–0.5)
Eosinophils Relative: 1 %
HCT: 33.1 % — ABNORMAL LOW (ref 39.0–52.0)
Hemoglobin: 10.7 g/dL — ABNORMAL LOW (ref 13.0–17.0)
Immature Granulocytes: 1 %
Lymphocytes Relative: 9 %
Lymphs Abs: 1.3 10*3/uL (ref 0.7–4.0)
MCH: 29.6 pg (ref 26.0–34.0)
MCHC: 32.3 g/dL (ref 30.0–36.0)
MCV: 91.7 fL (ref 80.0–100.0)
Monocytes Absolute: 1.1 10*3/uL — ABNORMAL HIGH (ref 0.1–1.0)
Monocytes Relative: 7 %
Neutro Abs: 12 10*3/uL — ABNORMAL HIGH (ref 1.7–7.7)
Neutrophils Relative %: 82 %
Platelets: 491 10*3/uL — ABNORMAL HIGH (ref 150–400)
RBC: 3.61 MIL/uL — ABNORMAL LOW (ref 4.22–5.81)
RDW: 14.7 % (ref 11.5–15.5)
WBC: 14.8 10*3/uL — ABNORMAL HIGH (ref 4.0–10.5)
nRBC: 0 % (ref 0.0–0.2)

## 2021-03-09 LAB — BASIC METABOLIC PANEL
Anion gap: 14 (ref 5–15)
BUN: 13 mg/dL (ref 8–23)
CO2: 25 mmol/L (ref 22–32)
Calcium: 9.7 mg/dL (ref 8.9–10.3)
Chloride: 96 mmol/L — ABNORMAL LOW (ref 98–111)
Creatinine, Ser: 1.35 mg/dL — ABNORMAL HIGH (ref 0.61–1.24)
GFR, Estimated: 58 mL/min — ABNORMAL LOW (ref >60)
Glucose, Bld: 95 mg/dL (ref 70–99)
Potassium: 3.9 mmol/L (ref 3.5–5.1)
Sodium: 135 mmol/L (ref 135–145)

## 2021-03-09 LAB — GLUCOSE, CAPILLARY
Glucose-Capillary: 87 mg/dL (ref 70–99)
Glucose-Capillary: 95 mg/dL (ref 70–99)

## 2021-03-09 LAB — MAGNESIUM: Magnesium: 1.8 mg/dL (ref 1.7–2.4)

## 2021-03-09 LAB — PHOSPHORUS: Phosphorus: 5.6 mg/dL — ABNORMAL HIGH (ref 2.5–4.6)

## 2021-03-09 MED ORDER — AMOXICILLIN-POT CLAVULANATE 875-125 MG PO TABS: 1.0000 | ORAL_TABLET | Freq: Two times a day (BID) | ORAL | 0 refills | Status: AC

## 2021-03-09 MED ORDER — MAGNESIUM OXIDE 400 (241.3 MG) MG PO TABS
400.0000 mg | ORAL_TABLET | Freq: Two times a day (BID) | ORAL | 0 refills | Status: AC
Start: 2021-03-09 — End: 2021-03-14

## 2021-03-09 MED ORDER — FAMOTIDINE 20 MG PO TABS: 20.0000 mg | ORAL_TABLET | Freq: Two times a day (BID) | ORAL | 0 refills | Status: DC

## 2021-03-09 MED ORDER — GABAPENTIN 100 MG PO CAPS: 200.0000 mg | ORAL_CAPSULE | Freq: Four times a day (QID) | ORAL | 0 refills | Status: DC

## 2021-03-09 NOTE — TOC Transition Note (Addendum)
Transition of Care [facility]) - CM/SW Discharge Note   Patient Details  Name: Christian Fields MRN: [id number] Date of Birth: 01-29-1956  Transition of Care [facility]) CM/SW Contact:  Ova Freshwater Phone Number: [phone] 03/09/2021, 9:12 AM   Clinical Narrative:     Patient will d/c home with [facility] from [facility] Andree Coss confirmed patient has been approved.  Patient will also d/c with [facility] for PT, OT, RN, [facility], Social Worker, Drue Novel confirmed. Patient will d/c with his daughter [name]  [phone] and will be staying at her house.  Ms. Doy Mince requested [facility] contact her with appointment requests and update.  CSW updated [facility] reps. Attending/ICU RN updated.    Final next level of care: [place] Barriers to Discharge: Continued Medical Work up   Patient Goals and CMS Choice        Discharge Placement                       Discharge Plan and Services     Post Acute Care Choice: Durable Medical Equipment (O2 2L chronic)                               Social Determinants of Health (SDOH) Interventions     Readmission Risk Interventions Readmission Risk Prevention Plan 11/15/2020  Transportation Screening Complete  PCP or Specialist Appt within 3-5 Days Not Complete  Not Complete comments Pending discharge, unit clerk will schedule.  [employer] or Home Care Consult Complete  Social Work Consult for [place] Planning/Counseling Complete  Palliative Care Screening Not Applicable  Medication Review Press photographer) Complete  Some recent data might be hidden

## 2021-03-09 NOTE — Plan of Care (Signed)
DISCHARGE NOTE HOME Christian Fields to be discharged home per MD order. Discussed prescriptions and medication list explained in detail. Patient verbalized understanding.  IV catheter discontinued intact. Site without signs and symptoms of complications. Dressing and pressure applied. Pt denies pain at the site currently. No complaints noted.  An After Visit Summary (AVS) was printed and given to the patient. Patient escorted via wheelchair, and discharged home via private auto.  Stephan Minister, RN

## 2021-03-09 NOTE — Progress Notes (Addendum)
Daily Progress Note   Patient Name: Christian Fields       Date: 03/09/2021 DOB: 1956/06/28  Age: 65 y.o. MRN#: [id number] Attending Physician: Val Riles, MD Primary Care Physician: Remi Haggard, FNP Admit Date: 02/26/2021  Reason for Consultation/Follow-up: Establishing goals of care  Subjective: Patient is resting in bed sleeping. Spoke with daughter, she states he should be discharging today and they are working on trilogy. She understands it's purpose and understands the dietary discussions, the recommendations that have been made, and prognosis.    Recommend palliative to follow outpatient to assist with ongoing conversations. He is a hospice candidate if he were to ever chose that path.   Length of Stay: 11  Current Medications: Scheduled Meds:  . allopurinol  300 mg Oral BID  . amoxicillin-clavulanate  1 tablet Oral BID  . aspirin EC  81 mg Oral Daily  . budesonide (PULMICORT) nebulizer solution  0.25 mg Nebulization BID  . Chlorhexidine Gluconate Cloth  6 each Topical Daily  . cholecalciferol  1,000 Units Oral Daily  . clopidogrel  75 mg Oral Daily  . DULoxetine  30 mg Oral Daily  . ezetimibe  10 mg Oral Daily  . famotidine  20 mg Oral BID  . gabapentin  200 mg Oral QID  . heparin  5,000 Units Subcutaneous Q8H  . insulin aspart  0-15 Units Subcutaneous TID WC  . insulin aspart  0-5 Units Subcutaneous QHS  . ipratropium-albuterol  3 mL Nebulization Q6H  . magnesium oxide  400 mg Oral BID  . mouth rinse  15 mL Mouth Rinse BID  . mouth rinse  15 mL Mouth Rinse BID  . metoprolol succinate  12.5 mg Oral Daily  . simvastatin  40 mg Oral Daily  . sodium chloride flush  10-40 mL Intracatheter Q12H    Continuous Infusions:   PRN Meds: ALPRAZolam, docusate sodium,  midodrine, ondansetron (ZOFRAN) IV, polyethylene glycol, sodium chloride flush  Physical Exam Constitutional:      Comments: Resting in bed with eyes closed.   Pulmonary:     Comments: Unlabored.             Vital Signs: BP 127/87   Pulse 98   Temp 98.2 F (36.8 C) (Oral)   Resp 20   Ht 5\' 8"  (1.727 m)  Wt 71.8 kg   SpO2 90%   BMI 24.07 kg/m  SpO2: SpO2: 90 % O2 Device: O2 Device: Nasal Cannula O2 Flow Rate: O2 Flow Rate (L/min): 4 L/min  Intake/output summary:   Intake/Output Summary (Last 24 hours) at 03/09/2021 1045 Last data filed at 03/08/2021 2132 Gross per 24 hour  Intake 10 ml  Output 125 ml  Net -115 ml   LBM: Last BM Date: 03/06/21 Baseline Weight: Weight: 74.4 kg Most recent weight: Weight: 71.8 kg         Flowsheet Rows   Flowsheet Row Most Recent Value  Intake Tab   Referral Department Critical care  Unit at Time of Referral ICU  Palliative Care Primary Diagnosis Pulmonary  Date Notified 03/02/21  Palliative Care Type New Palliative care  Reason for referral Clarify Goals of Care  Date of Admission 02/26/21  Date first seen by Palliative Care 03/02/21  # of days Palliative referral response time 0 Day(s)  # of days IP prior to Palliative referral 4  Clinical Assessment   Psychosocial & Spiritual Assessment   Palliative Care Outcomes       Patient Active Problem List   Diagnosis Date Noted  . Acute on chronic respiratory failure with hypoxia ([place]) 03/08/2021  . Septic shock ([place]) 02/26/2021  . Polysubstance (excluding opioids) dependence ([place]) 01/30/2021  . Acute metabolic encephalopathy 32/99/2426  . COPD with acute exacerbation ([place]) 01/29/2021  . Iron deficiency anemia 11/27/2020  . Severe sepsis ([place])   . NSCLC of left lung ([place]) 11/12/2020  . Left leg swelling 04/28/2020  . Confusion   . Acute respiratory failure with hypoxia ([place]) 03/26/2020  . Pneumonia of both lower lobes due to infectious organism 03/25/2020  . AKI (acute  kidney injury) ([place]) 01/13/2020  . Cellulitis of left leg 01/12/2020  . COVID-19 virus detected 01/12/2020  . Diabetic ulcer of left midfoot associated with type 2 diabetes mellitus, with fat layer exposed ([place]) 01/12/2020  . Panlobular emphysema ([place]) 01/12/2020  . Arthritis 10/29/2019  . Hemorrhoids 10/29/2019  . Senile nuclear sclerosis, bilateral 02/15/2019  . Cellulitis of left upper limb 02/11/2019  . AAA (abdominal aortic aneurysm) without rupture ([place]) 01/15/2019  . Localized swelling, mass and lump, upper limb 12/24/2018  . Pain in femur 12/24/2018  . TIA (transient ischemic attack) 11/18/2018  . Cortical age-related cataract of both eyes 11/02/2018  . Fracture of multiple ribs 04/20/2018  . Overweight (BMI 25.0-29.9) 04/20/2018  . Hypertension 07/08/2017  . Diabetes mellitus ([place]) 07/08/2017  . Hyperlipidemia 07/08/2017  . Tobacco use disorder 07/08/2017  . Atherosclerosis of native arteries of extremity with intermittent claudication ([place]) 07/08/2017  . SOB (shortness of breath) 06/19/2017  . Acute on chronic respiratory failure with hypoxia and hypercapnia ([place]) 12/15/2016  . CAP (community acquired pneumonia) 12/15/2016  . Chronic pain 12/15/2016  . Chronically on opiate therapy 12/15/2016  . History of kidney stones 12/15/2016  . Polypharmacy 12/15/2016  . Somnolence 12/15/2016  . Foot ulcer ([place]) 01/19/2016  . 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 Other psychoactive substance use, unspecified with withdrawal, uncomplicated ([place]) 29/79/8921  . Type 2 diabetes mellitus with foot ulcer (CODE) ([place]) 11/27/2015  . Chronic obstructive pulmonary disease, unspecified ([place]) 01/18/2013  . Depressive disorder 01/18/2013  . Hereditary and idiopathic peripheral neuropathy 01/18/2013  . Sleep apnea 01/18/2013  . Low back pain 11/23/2010  . Acute gouty arthropathy 07/10/2010  . Problem related to lifestyle 10/29/2004    Palliative Care Assessment &  Plan  Recommendations/Plan: Recommend palliative to follow outpatient for ongoing [place].   Code Status:    Code Status Orders  (From admission, onward)         Start     Ordered   02/26/21 2155  Full code  Continuous        02/26/21 2157        Code Status History    Date Active Date Inactive Code Status Order ID Comments User Context   01/29/2021 2351 02/01/2021 1722 Full Code [id number]  Athena Masse, MD ED   11/12/2020 2253 11/18/2020 2110 Full Code [id number]  Neena Rhymes, MD ED   03/26/2020 0136 03/28/2020 2308 Full Code [id number]  Nicolette Bang, DO ED   01/19/2016 2021 01/20/2016 1818 Full Code [id number]  Hillary Bow, MD Inpatient   Advance Care Planning Activity      Prognosis:  < 6 months   Care plan was discussed with RN  Thank you for allowing the Palliative Medicine Team to assist in the care of this patient.   Total Time 15 min Prolonged Time Billed no      Greater than 50%  of this time was spent counseling and coordinating care related to the above assessment and plan.  Asencion Gowda, NP  Please contact Palliative Medicine Team phone at [phone] for questions and concerns.

## 2021-03-09 NOTE — Discharge Summary (Signed)
[facility] Discharge Summary   Patient: Christian Fields  PCP: Remi Haggard, FNP  Date of admission: 02/26/2021   Date of discharge:  03/09/2021     Discharge Diagnoses:  Principal diagnosis Acute hypoxic respiratory failure secondary to COPD exacerbation and possible aspiration pneumonia Active Problems:   Acute respiratory failure with hypoxia (HCC)   COPD with acute exacerbation ([place])   Septic shock ([place])   Acute on chronic respiratory failure with hypoxia ([place])   Admitted From: Home Disposition:  Home with HHPT  Recommendations for Outpatient Follow-up:  1. PCP: in 1 wk  2. Follow with the pulmonologist in 1 week 3. Follow up LABS/TEST: CBC, BMP, magnesium in 1 week, repeat chest x-ray after 4 weeks   Diet recommendation: Cardiac diet  Activity: The patient is advised to gradually reintroduce usual activities, as tolerated  Discharge Condition: stable  Code Status: Full code   History of present illness: As per the H and P dictated on admission Hospital Course:  65 year old male with known COPD on chronic 2 L nasal cannula at home and prior left upper lobe lung cancer status post radiation presents to the ED from home via EMS in acute hypoxic respiratory failure ultimately requiring BiPAP and vasopressors for septic shock. Per ED documentation, upon EMS arrival they were unable to obtain SPO2 readings. Patient was placed on a nonrebreather for oxygen support with SPO2 readings in the high 80's. He also received 2 duo nebs in route with EMS. ED course: Initial vitals-99.1, tachypneic at 25, tachycardic at 138, BP 130/116, SPO2 80% on nonrebreather at 15 L. Patient was worked up for sepsis on arrival, receiving 2.5 liters with azithromycin and cefepime for CAP coverage. Patient ultimately requiring BiPAP &vasopressor support. Significant labs: Procalcitonin elevated at 99.28, BNP elevated at 1149.5, lactic acidosis 7.4, hypokalemia at 2.9 & AKI: BUN  35/creatinine 3.05 elevated from 1.05 in February 2022. PCCM consulted for admission due to vasopressor administration & BiPAP support. Significant Hospital Events: Including procedures, antibiotic start and stop dates in addition to other pertinent events    02/26/2021-admit to ICU due to septic shock requiring vasopressor administration and BiPAP support.  02/27/2021- Left femoral CVC placed due to high dose pressors. Tolerating BiPAP, VBG improving. ABX changed to Vancomycin & Zosyn due to worsening lactic acidosis  02/28/2021-Fluids stopped overnight due to "sounding wet", CXR without interim change. Vasopressin weaned down, decreasing Levophed requirements, lactic acidosis resolved Cultures:  3/21: SARS-CoV-2 PCR>>negative 3/21: Influenza PCR>>negative 3/21: Blood culture x2>> 3/22: Strep pneumo urinary antigen>>negative 3/22: Legionella urinary antigen>> 3/22: Mycoplasma Pneumoniae antibody>> 3/22: MRSA PCR>>positive 3/23: Sputum>> Antimicrobials:  Azithromycin 3/21>>3/22 Cefepime 3/21>>3/22 Vancomycin 3/21 x1 dose, 3/22>> Zosyn 3/22>> Speech therapist consulted on 03/02/2021, swallow evaluation revealed mild aspiration risk with recommendation for mechanical soft diet with meat minced with gravy, thin liquids via cup, 100% supervision and support feeding at meals. Home meds with pure versus need for crushed [name] assumed care on 03/02/2021. He was assessed by PT OT with recommendation for SNF however patient has declined going to SNF and wants to return home with home health services. Patient was seen by speech therapist due to dysphagia. He is at high risk for aspiration buthas declined MBS or nectar thick diet if recommended. Per speech therapist, he is Christian Fields risk for recurrent aspiration. Palliative care team has been consulted to assist with establishing goals of care. Assessment and Plan: # Septic shock, shock has resolved, he is no longer on vasopressor, secondary to  right  lower lobe pneumonia. off IV vasopressors, transition to midodrine 5 mg p.o. 3 times daily.  Blood pressure remained stable.  Switched to oral antibiotics, azithromycin 500 mg daily x3 days, completed 8 days of IV Zosyn. Persistent leukocytosis this morning, procalcitonin 3.76. MRSA screening test positive on 02/27/2021,has received IV vancomycin. Sputum culture, few yeast. Procalcitonin downtrending, 5 from 25 from greater than 150 on 02/28/2021. WBC 18--15.5 elevated, procalcitonin 2.38 low.  Still patient has some leukocytosis.  Prescribed Augmentin twice daily for 3 days.  Recommend repeat CBC after 1 week.  And follow with PCP # Acute on chronic hypoxic hypercarbic respiratory failure secondary to right lower lobe pneumonia, aspiration pneumonitis,and pulmonary edema and COPD exacerbation. Maintain O2 saturation between 88 and 94% as recommended by pulmonary. At baseline he is on 2 L nasal cannula. 3/31 patient's O2 saturation dropped to 64% on 4 L, unable to arouse, patient was placed on a BiPAP and transferred to ICU. ABG 7.4 7/39/59/20 8.4/92%.  Patient did wear BiPAP last night, currently patient is saturating well on 4 L oxygen via nasal cannula.  [facility] was arranged for home use.  Resumed Trelegy inhaler, patient was advised to follow with pulmonologist and repeat chest x-ray after 4 weeks.  Dysphagia with concern for aspiration. Patient was seen by speech therapist due to dysphagia. He is at high risk for aspiration buthas declined MBS or nectar thick diet if recommended. Per speech therapist, he is Christian Fields risk for recurrent aspiration. Palliative care team has been consulted to assist with establishing goals of care. He is currently on dysphagia 3 diet. Acute on chronic diastolic CHF, Last 2D echo done on 02/28/2021 showed normal LVEF 55 to 60% with grade 2 diastolic dysfunction. BNP uprising greater than 2400 from 1100 on 02/26/2021, Off diuretics due to recent septic shock requiring  vasopressors.  Resolved nonoliguric AKI, Baseline creatinine 0.9 with GFR greater than 60. Presented with creatinine of 3.05 with GFR 22.  GERD, discontinued PPI secondary to hypomagnesemia, started Pepcid History of lacunar CVA, Head CT done on 01/29/2021, ordered after unwitnessed fall revealed: Small chronic bilateral basal ganglia lacunar infarcts. States he has been dropping things unintentionally from his hands. Continue aspirin Plavix and Zetia. Resolved atypical chest pain, reproducible on palpation, Troponin elevated, peaked at 56 and trended down.  Remained chest pain-free. Resolved post repletion: Refractory hypokalemia, Serum potassium 3.0> 4.1>3.0> 3.4>4.7. Repleted orally Refractory hypomagnesemia, could be secondary to PPI Serum magnesium 1.1> 1.9> 1.4>2.3--1.4--1.6. Repleted intravenously. Discontinue PPI which can cause hypomagnesemia, started Pepcid Resolvedpost repletion: Hypophosphatemia, Serum phosphorus 1.4> 1.6> 2.4>4.4.[name] Kitchen Repleted orally. Chronic back pain on Suboxone PTA. Normally on Suboxone at home as well as gabapentin. Reduce home dose of gabapentin, he was taking 800 mg 4 times a day. Dose reduced to 200 mg 4 times a day as he was getting too drowsy on higher doses. UDS positive for benzodiazepine and opiates. TOC will provide resources prior to DC. Essential hypertension, BPs are currently soft. Recently off vasopressors due to septic shock.  BP remained stable, on 3/30 changed midodrine 5 mg TID PRN if SBp <100, Continue Toprol-XL 12.5 mg dailytoavoid beta-blockade withdrawal. Impaired glucose tolerance, s/p insulin sliding scale.Hemoglobin A1c 5.5 on 01/30/2021.  Resumed home medication, follow with PCP. Physical debility, PT OT recommending SNF. Patient has declined SNF and wants to return to home. Continue fall precautions. Body mass index is 24.07 kg/m.  Nutrition Interventions:  Patient was seen by physical therapy, who recommended , which was  arranged. On the day of  the discharge the patient's vitals were stable, and no other acute medical condition were reported by patient. the patient was felt safe to be discharge at Home with Home health.  Consultants: PCCM, Palliative care Procedures: None  Discharge Exam: General: Appear in no distress, no Rash; Oral Mucosa Clear, moist. Cardiovascular: S1 and S2 Present, no Murmur, Respiratory: normal respiratory effort, Bilateral Air entry present and no Crackles, no wheezes Abdomen: Bowel Sound present, Soft and no tenderness, no hernia Extremities: no Pedal edema, no calf tenderness Neurology: alert and no focal deficit affect appropriate.  Filed Weights   03/08/21 0449 03/08/21 1121 03/09/21 0500  Weight: 76.2 kg 75.3 kg 71.8 kg   Vitals:   03/09/21 0500 03/09/21 0734  BP: 127/87   Pulse: 98   Resp: 20   Temp:    SpO2: 99% 90%    DISCHARGE MEDICATION: Allergies as of 03/09/2021      Reactions   Bee Venom Anaphylaxis      Medication List    STOP taking these medications   buprenorphine-naloxone 8-2 mg Subl SL tablet Commonly known as: SUBOXONE   gabapentin 800 MG tablet Commonly known as: NEURONTIN Replaced by: gabapentin 100 MG capsule   pantoprazole 40 MG tablet Commonly known as: PROTONIX     TAKE these medications   albuterol 108 (90 Base) MCG/ACT inhaler Commonly known as: VENTOLIN HFA Inhale 2 puffs into the lungs every 4 (four) hours as needed for wheezing or shortness of breath.   allopurinol 300 MG tablet Commonly known as: ZYLOPRIM Take 600 mg by mouth 2 (two) times daily.   amoxicillin-clavulanate 875-125 MG tablet Commonly known as: AUGMENTIN Take 1 tablet by mouth 2 (two) times daily for 3 days.   aspirin EC 81 MG tablet Take 1 tablet by mouth daily.   cholecalciferol 1000 units tablet Commonly known as: VITAMIN D Take 1,000 Units by mouth daily.   clopidogrel 75 MG tablet Commonly known as: PLAVIX Take 1 tablet (75 mg total) by  mouth daily.   DULoxetine 30 MG capsule Commonly known as: CYMBALTA Take 1 capsule by mouth daily.   ezetimibe 10 MG tablet Commonly known as: ZETIA Take 10 mg by mouth daily.   famotidine 20 MG tablet Commonly known as: PEPCID Take 1 tablet (20 mg total) by mouth 2 (two) times daily.   Farxiga 10 MG Tabs tablet Generic drug: dapagliflozin propanediol Take 10 mg by mouth daily.   ferrous sulfate 325 (65 FE) MG tablet Take 325 mg by mouth daily with breakfast.   gabapentin 100 MG capsule Commonly known as: NEURONTIN Take 2 capsules (200 mg total) by mouth 4 (four) times daily. Replaces: gabapentin 800 MG tablet   Kombiglyze XR 2.04-999 MG Tb24 Generic drug: Saxagliptin-Metformin Take 2 tablets by mouth daily.   magnesium oxide 400 (241.3 Mg) MG tablet Commonly known as: MAG-OX Take 1 tablet (400 mg total) by mouth 2 (two) times daily for 5 days.   metoprolol succinate 50 MG 24 hr tablet Commonly known as: TOPROL-XL Take 50 mg by mouth daily.   OXYGEN Inhale 2 L into the lungs.   simvastatin 40 MG tablet Commonly known as: ZOCOR Take 1 tablet by mouth daily.   Trelegy Ellipta 100-62.5-25 MCG/INH Aepb Generic drug: Fluticasone-Umeclidin-Vilant Inhale 1 puff into the lungs daily.            Durable Medical Equipment  (From admission, onward)         Start     Ordered   03/05/21  1059  For home use only DME oxygen  Once       Question Answer Comment  Length of Need 12 Months   Mode or (Route) Nasal cannula   Liters per Minute 5   Frequency Continuous (stationary and portable oxygen unit needed)   Oxygen conserving device Yes   Oxygen delivery system Gas      03/05/21 1058         Allergies  Allergen Reactions  . Bee Venom Anaphylaxis   Discharge Instructions    Diet - low sodium heart healthy   Complete by: As directed    Discharge instructions   Complete by: As directed    F/u with PCP in 1 wk, monitor BP at home and f/u with pcp for  meds F/u Pulmologist in 1-2 wks   Increase activity slowly   Complete by: As directed    No wound care   Complete by: As directed       The results of significant diagnostics from this hospitalization (including imaging, microbiology, ancillary and laboratory) are listed below for reference.    Significant Diagnostic Studies: CT ABDOMEN PELVIS WO CONTRAST  Result Date: 02/27/2021 CLINICAL DATA:  Sepsis. EXAM: CT ABDOMEN AND PELVIS WITHOUT CONTRAST TECHNIQUE: Multidetector CT imaging of the abdomen and pelvis was performed following the standard protocol without IV contrast. COMPARISON:  Chest CT on 02/21/2021, and PET-CT on 08/24/2020 FINDINGS: Lower chest: Bilateral lower lobe airspace disease is new since recent chest CT. Hepatobiliary: No mass visualized on this unenhanced exam. Gallbladder is unremarkable. No evidence of biliary ductal dilatation. Pancreas: No mass or inflammatory process visualized on this unenhanced exam. Spleen:  Within normal limits in size. Adrenals/Urinary tract: Extensive right renal vascular calcification noted. No evidence of urolithiasis or hydronephrosis. A small fluid attenuation cyst is again seen in the midpole of the left kidney. A 1.7 cm subcapsular lesion is seen in the posterior midpole of the left kidney, which has higher than fluid attenuation and has increased in size since prior study. This could represent a hemorrhagic cyst or solid renal mass. Foley catheter is noted within the urinary bladder, which is otherwise unremarkable. Stomach/Bowel: No evidence of obstruction, inflammatory process, or abnormal fluid collections. Vascular/Lymphatic: No pathologically enlarged lymph nodes identified. 3.8 cm infrarenal abdominal aortic aneurysm shows mild increase in size compared to 3.6 cm previously. A left femoral venous catheter is seen in place. Reproductive:  No mass or other significant abnormality. Other: No acute inflammatory process or abscess identified  within the abdomen or pelvis. Musculoskeletal:  No suspicious bone lesions identified. IMPRESSION: No evidence of acute inflammatory process or abscess within the abdomen or pelvis. New bilateral lower lobe airspace disease, suspicious for pneumonia. Increased size of 1.7 cm subcapsular lesion in posterior midpole of left kidney, which could represent a hemorrhagic cyst or solid renal mass. Abdomen MRI is recommended for further characterization. Mild increase in size of 3.8 cm infrarenal abdominal aortic aneurysm. Recommend follow-up every 2 years. This recommendation follows ACR consensus guidelines: White Paper of the ACR Incidental Findings Committee II on Vascular Findings. J Am Coll Radiol 2013; 10:789-794. Electronically Signed   By: Marlaine Hind M.D.   On: 02/27/2021 17:34   DG Abdomen 1 View  Result Date: 02/27/2021 CLINICAL DATA:  65 year old male status post central line placement. EXAM: ABDOMEN - 1 VIEW COMPARISON:  None FINDINGS: Left femoral central line with tip in the region of the left common iliac. Air-filled loops of small and large bowel noted.  Degenerative changes of the spine. The soft tissues are grossly unremarkable. IMPRESSION: Left femoral access line with tip in the region of the left common iliac vessel. Electronically Signed   By: Anner Crete M.D.   On: 02/27/2021 02:43   CT Chest Wo Contrast  Result Date: 02/21/2021 CLINICAL DATA:  Follow-up lung cancer, status post radiation therapy to left lung, 07/2020, history of COVID pneumonia 12/2020 EXAM: CT CHEST WITHOUT CONTRAST TECHNIQUE: Multidetector CT imaging of the chest was performed following the standard protocol without IV contrast. COMPARISON:  01/29/2021, 11/20/2020, 08/08/2020 FINDINGS: Cardiovascular: Aortic atherosclerosis. Normal heart size. Left coronary artery calcifications. No pericardial effusion. Enlargement of the main pulmonary artery, measuring up to 4.0 cm in caliber. Mediastinum/Nodes: Numerous prominent  subcentimeter lymph nodes throughout the mediastinum and hila, unchanged. No pathologically enlarged mediastinal, hilar, or axillary lymph nodes. Thyroid gland, trachea, and esophagus demonstrate no significant findings. Lungs/Pleura: Moderate centrilobular emphysema. Diffuse bilateral bronchial wall thickening. Interval resolution of previously seen dependent bilateral ground-glass and consolidative airspace disease with minimal residual bandlike scarring of the dependent lung bases and right upper lobe. No significant interval change in an irregular nodule of the peripheral left upper lobe measuring 1.2 x 1.0 cm, with some minimal developing adjacent ground-glass airspace opacity and architectural distortion about the major fissure (series 3, image 53). No significant interval change in additional adjacent subpleural nodule of the peripheral left upper lobe measuring 4 mm (series 3, image 39). No change in a 4 mm subpleural nodule of the medial posterior right upper lobe (series 3, image 47). No pleural effusion or pneumothorax. Upper Abdomen: No acute abnormality. Musculoskeletal: No chest wall mass or suspicious bone lesions identified. IMPRESSION: 1. No significant interval change in an irregular nodule of the peripheral left upper lobe measuring 1.2 x 1.0 cm, with some minimal developing adjacent ground-glass airspace opacity and architectural distortion about the major fissure. Findings are consistent with stable, previously FDG avid suspicious pulmonary nodule and early developing radiation change. 2. Additional stable small nodules measuring 4 mm or smaller. Attention on follow-up. 3. Moderate centrilobular emphysema 4. Diffuse bilateral bronchial wall thickening, consistent with nonspecific infectious or inflammatory bronchitis. 5. Numerous prominent subcentimeter lymph nodes throughout the mediastinum and hila, unchanged, likely reactive. 6. Coronary artery disease. 7. Enlargement of the main pulmonary  artery, as can be seen in pulmonary hypertension. Aortic Atherosclerosis (ICD10-I70.0) and Emphysema (ICD10-J43.9). Electronically Signed   By: Eddie Candle M.D.   On: 02/21/2021 18:29   US RENAL  Result Date: 02/27/2021 CLINICAL DATA:  Acute renal injury EXAM: RENAL / URINARY TRACT ULTRASOUND COMPLETE COMPARISON:  08/24/2020 FINDINGS: Right Kidney: Renal measurements: 10.6 x 4.9 x 5.9 cm. = volume: 162 mL. No mass lesion or hydronephrosis is noted. Scattered vascular calcifications are noted similar to that seen on the prior exam. Left Kidney: Renal measurements: 10.5 x 6.0 x 5.2 cm. = volume: 172 mL. 2.4 cm cyst is noted in the midportion of left kidney. No mass lesion or hydronephrosis is noted. Bladder: Decompressed by Foley catheter. Other: None. IMPRESSION: Left renal cyst. No hydronephrosis is noted. Electronically Signed   By: Inez Catalina M.D.   On: 02/27/2021 14:26   DG Chest Port 1 View  Result Date: 03/02/2021 CLINICAL DATA:  Hypoxia EXAM: PORTABLE CHEST 1 VIEW COMPARISON:  Two days ago FINDINGS: Borderline heart size. Stable aortic and hilar contours. Congested appearance of central vessels with diffuse interstitial opacification. There is underlying emphysema. Nipple shadow is noted at the right base confirmed by  CT. No effusion or pneumothorax. IMPRESSION: Interstitial opacity above recent baseline, favor pulmonary edema. No change compared to 2 days ago. Emphysema. Electronically Signed   By: Monte Fantasia M.D.   On: 03/02/2021 04:18   DG Chest Port 1 View  Result Date: 02/28/2021 CLINICAL DATA:  Respiratory failure. EXAM: PORTABLE CHEST 1 VIEW COMPARISON:  Chest x-ray 02/27/2021.  Chest CT 02/21/2021. FINDINGS: Heart size stable. Persistent low lung volumes with bibasilar infiltrates/edema. No interim change. No pleural effusion or pneumothorax. Reference is made to chest CT of 02/21/2021 for discussion of pulmonary nodules present. Old left lower rib fractures again noted. IMPRESSION:  1. Persistent low lung volumes with bibasilar infiltrates/edema. No interim change. 2. Reference is made to chest CT of 02/21/2021 for discussion of pulmonary nodules present. Electronically Signed   By: Marcello Moores  Register   On: 02/28/2021 05:49   DG Chest Portable 1 View  Result Date: 02/27/2021 CLINICAL DATA:  Shortness of breath EXAM: PORTABLE CHEST 1 VIEW COMPARISON:  02/26/2021 FINDINGS: Unchanged right basilar opacities. Left basilar atelectasis has worsened. There is calcific aortic atherosclerosis. No pleural effusion. IMPRESSION: Worsening left basilar atelectasis. Unchanged right basilar opacities that may indicate infection. Electronically Signed   By: Ulyses Jarred M.D.   On: 02/27/2021 02:40   DG Chest Portable 1 View  Result Date: 02/26/2021 CLINICAL DATA:  65 year old male with shortness of breath. EXAM: PORTABLE CHEST 1 VIEW COMPARISON:  Chest radiograph dated 01/29/2021 and CT dated 02/21/2021. FINDINGS: Background of emphysema. A 14 mm faintly visualized nodular focus in the left mid lung field may correspond to the nodule seen on the prior CT. There is diffuse chronic interstitial coarsening and bronchitic changes. There is increased interstitial markings and nodularity involving the right mid to lower lung field most consistent with superimposed pneumonia. Clinical correlation and follow-up to resolution recommended. No pleural effusion pneumothorax. The cardiac silhouette is within limits. Atherosclerotic calcification of the aorta. No acute osseous pathology. IMPRESSION: 1. Right mid to lower lung field pneumonia. Clinical correlation and follow-up to resolution recommended. 2. Emphysema. Electronically Signed   By: Anner Crete M.D.   On: 02/26/2021 19:38   ECHOCARDIOGRAM COMPLETE  Result Date: 02/28/2021    ECHOCARDIOGRAM REPORT   Patient Name:   Christian Fields Date of Exam: 02/28/2021 Medical Rec #:  [id number]      Height:       69.0 in Accession #:    [id number]     Weight:        178.8 lb Date of Birth:  1956-02-05      BSA:          1.970 m Patient Age:    41 years       BP:           108/66 mmHg Patient Gender: M              HR:           108 bpm. Exam Location:  [facility] Procedure: 2D Echo, Cardiac Doppler, Color Doppler and Strain Analysis                               MODIFIED REPORT: This report was modified by Kate Sable MD on 02/28/2021 due to IVC nwv.  Indications:     Cardiomyopathy-unspecified I42.9  History:         Patient has no prior history of Echocardiogram examinations.  COPD; Risk Factors:Hypertension.  Sonographer:     Sherrie Sport RDCS (AE) Referring Phys:  [id number] [place]IT[place] L [place] Diagnosing Phys: Kate Sable MD  Sonographer Comments: Global longitudinal strain was attempted. IMPRESSIONS  1. Left ventricular ejection fraction, by estimation, is 55 to 60%. Left ventricular ejection fraction by 3D volume is 60 %. The left ventricle has normal function. The left ventricle has no regional wall motion abnormalities. Left ventricular diastolic  parameters are consistent with Grade II diastolic dysfunction (pseudonormalization). The average left ventricular global longitudinal strain is -11.5 %. The global longitudinal strain is abnormal.  2. Right ventricular systolic function is normal. The right ventricular size is normal.  3. Right atrial size was mildly dilated.  4. The mitral valve is normal in structure. No evidence of mitral valve regurgitation.  5. The aortic valve was not well visualized. Aortic valve regurgitation is not visualized.  6. Pulmonic valve regurgitation not assessed. FINDINGS  Left Ventricle: Left ventricular ejection fraction, by estimation, is 55 to 60%. Left ventricular ejection fraction by 3D volume is 60 %. The left ventricle has normal function. The left ventricle has no regional wall motion abnormalities. The average left ventricular global longitudinal strain is -11.5 %. The global longitudinal strain is abnormal.  The left ventricular internal cavity size was normal in size. There is no left ventricular hypertrophy. Left ventricular diastolic parameters are consistent with Grade II diastolic dysfunction (pseudonormalization). Right Ventricle: The right ventricular size is normal. No increase in right ventricular wall thickness. Right ventricular systolic function is normal. Left Atrium: Left atrial size was normal in size. Right Atrium: Right atrial size was mildly dilated. Pericardium: There is no evidence of pericardial effusion. Mitral Valve: The mitral valve is normal in structure. No evidence of mitral valve regurgitation. Tricuspid Valve: The tricuspid valve is normal in structure. Tricuspid valve regurgitation is trivial. Aortic Valve: The aortic valve was not well visualized. Aortic valve regurgitation is not visualized. Aortic valve mean gradient measures 4.0 mmHg. Aortic valve peak gradient measures 5.9 mmHg. Aortic valve area, by VTI measures 3.31 cm. Pulmonic Valve: The pulmonic valve was not assessed. Pulmonic valve regurgitation not assessed. Aorta: The aortic root is normal in size and structure. Venous: The inferior vena cava was not well visualized. IAS/Shunts: No atrial level shunt detected by color flow Doppler.  LEFT VENTRICLE PLAX 2D LVIDd:         4.15 cm         Diastology LVIDs:         2.98 cm         LV e' medial:    4.24 cm/s LV PW:         1.32 cm         LV E/e' medial:  34.2 LV IVS:        0.77 cm         LV e' lateral:   5.11 cm/s LVOT diam:     2.20 cm         LV E/e' lateral: 28.4 LV SV:         75 LV SV Index:   38              2D LVOT Area:     3.80 cm        Longitudinal                                Strain  2D Strain GLS  -11.5 %                                Avg:                                 3D Volume EF                                LV 3D EF:    Left                                             ventricular                                              ejection                                             fraction by                                             3D volume                                             is 60 %.                                 3D Volume EF:                                3D EF:        60 %                                LV EDV:       147 ml                                LV ESV:       58 ml                                LV SV:        89 ml RIGHT VENTRICLE RV Basal diam:  4.06 cm RV S prime:     12.80 cm/s TAPSE (M-mode): 4.4 cm LEFT ATRIUM             Index       RIGHT ATRIUM           Index LA diam:  4.50 cm 2.28 cm/m  RA Area:     23.10 cm LA Vol (A2C):   37.4 ml 18.99 ml/m RA Volume:   74.10 ml  37.62 ml/m LA Vol (A4C):   40.1 ml 20.36 ml/m LA Biplane Vol: 39.7 ml 20.15 ml/m  AORTIC VALVE AV Area (Vmax):    3.03 cm AV Area (Vmean):   2.82 cm AV Area (VTI):     3.31 cm AV Vmax:           121.00 cm/s AV Vmean:          86.600 cm/s AV VTI:            0.226 m AV Peak Grad:      5.9 mmHg AV Mean Grad:      4.0 mmHg LVOT Vmax:         96.60 cm/s LVOT Vmean:        64.200 cm/s LVOT VTI:          0.197 m LVOT/AV VTI ratio: 0.87  AORTA Ao Root diam: 3.50 cm MITRAL VALVE                TRICUSPID VALVE MV Area (PHT): 7.66 cm     TR Peak grad:   29.2 mmHg MV Decel Time: 99 msec      TR Vmax:        270.00 cm/s MV E velocity: 145.00 cm/s MV A velocity: 35.60 cm/s   SHUNTS MV E/A ratio:  4.07         Systemic VTI:  0.20 m                             Systemic Diam: 2.20 cm Kate Sable MD Electronically signed by Kate Sable MD Signature Date/Time: 02/28/2021/6:18:16 PM    Final (Updated)     Microbiology: Recent Results (from the past 240 hour(s))  MRSA PCR Screening     Status: Abnormal   Collection Time: 02/27/21 10:58 PM   Specimen: Nasopharyngeal  Result Value Ref Range Status   MRSA by PCR POSITIVE (A) NEGATIVE Final    Comment:        The GeneXpert MRSA Assay (FDA approved for NASAL specimens only), is  one component of a comprehensive MRSA colonization surveillance program. It is not intended to diagnose MRSA infection nor to guide or monitor treatment for MRSA infections. RESULT CALLED TO, READ BACK BY AND VERIFIED WITH: CICI VANN'BRAY @0047  ON 02/28/21 SKL Performed at [facility], [place], [place] [address]   Expectorated Sputum Assessment w Gram Stain, Rflx to Resp Cult     Status: None   Collection Time: 02/28/21  5:32 PM   Specimen: Expectorated Sputum  Result Value Ref Range Status   Specimen Description EXPECTORATED SPUTUM  Final   Special Requests NONE  Final   Sputum evaluation   Final    Sputum specimen not acceptable for testing.  Please recollect.   RESULT CALLED TO, READ BACK BY AND VERIFIED WITH: CHARLIE FOEEPWOOD AT 1856 ON 02/28/21 BY SS Performed at [facility], [address] [place], [place] [address]    Report Status 02/28/2021 FINAL  Final  Expectorated Sputum Assessment w Gram Stain, Rflx to Resp Cult     Status: None   Collection Time: 03/03/21  6:46 AM   Specimen: Sputum  Result Value Ref Range Status   Specimen Description SPUTUM  Final   Special  Requests NONE  Final   Sputum evaluation   Final    THIS SPECIMEN IS ACCEPTABLE FOR SPUTUM CULTURE Performed at [facility], [address] [place], [place] [address]    Report Status 03/03/2021 FINAL  Final  Culture, Respiratory w Gram Stain     Status: None   Collection Time: 03/03/21  6:46 AM   Specimen: SPU  Result Value Ref Range Status   Specimen Description   Final    SPUTUM Performed at [facility], [address], [place], [place] [address]    Special Requests   Final    NONE Reflexed from [id number] Performed at [facility], [place], [place] [address]    Gram Stain FEW WBC PRESENT, PREDOMINANTLY PMN FEW YEAST   Final   Culture   Final    FEW Consistent with normal respiratory flora. No Pseudomonas species  isolated Performed at [facility] [address], [place],  [address]    Report Status 03/05/2021 FINAL  Final     Labs: CBC: Recent Labs  Lab 03/05/21 0553 03/06/21 0502 03/07/21 0415 03/08/21 0501 03/09/21 0619  WBC 18.0* 15.3* 18.3* 15.5* 14.8*  NEUTROABS 15.2* 12.5* 15.6* 13.0* 12.0*  HGB 9.2* 9.5* 9.6* 9.6* 10.7*  HCT 28.7* 28.5* 29.8* 29.4* 33.1*  MCV 92.9 91.3 91.4 91.0 91.7  PLT 248 311 360 394 038*   Basic Metabolic Panel: Recent Labs  Lab 03/05/21 0553 03/06/21 0502 03/07/21 0415 03/08/21 0501 03/09/21 0619  NA 135 135 134* 133* 135  K 3.4* 4.7 4.2 4.0 3.9  CL 97* 97* 96* 94* 96*  CO2 29 30 29 27 25   GLUCOSE 105* 102* 104* 102* 95  BUN 17 13 13 12 13   CREATININE 1.05 0.98 1.03 1.04 1.35*  CALCIUM 8.4* 8.8* 9.0 9.1 9.7  MG 1.3* 2.3 1.4* 1.6* 1.8  PHOS 3.8 4.4 4.9* 4.7* 5.6*   Liver Function Tests: No results for input(s): AST, ALT, ALKPHOS, BILITOT, PROT, ALBUMIN in the last 168 hours. No results for input(s): LIPASE, AMYLASE in the last 168 hours. No results for input(s): AMMONIA in the last 168 hours. Cardiac Enzymes: No results for input(s): CKTOTAL, CKMB, CKMBINDEX, TROPONINI in the last 168 hours. BNP (last 3 results) Recent Labs    02/26/21 1911 03/02/21 0830 03/03/21 0428  BNP 1,149.5* 2,483.2* 1,303.0*   CBG: Recent Labs  Lab 03/08/21 0921 03/08/21 1127 03/08/21 1556 03/08/21 2120 03/09/21 0731  GLUCAP 120* 95 96 96 87    Time spent: 35 minutes  Signed:  Val Riles  [facility]  03/09/2021 11:41 AM

## 2021-03-25 DIAGNOSIS — M79605 Pain in left leg: Secondary | ICD-10-CM | POA: Insufficient documentation

## 2021-03-30 ENCOUNTER — Other Ambulatory Visit: Payer: Self-pay | Admitting: *Deleted

## 2021-03-30 ENCOUNTER — Telehealth: Payer: Self-pay | Admitting: Oncology

## 2021-03-30 DIAGNOSIS — E538 Deficiency of other specified B group vitamins: Secondary | ICD-10-CM

## 2021-03-30 DIAGNOSIS — D508 Other iron deficiency anemias: Secondary | ICD-10-CM

## 2021-03-30 DIAGNOSIS — D509 Iron deficiency anemia, unspecified: Secondary | ICD-10-CM

## 2021-03-30 NOTE — Telephone Encounter (Signed)
Spoke with patient to schedule repeat labs/MD/Possible iron infusion next week. Patient agreeable to come in on 4/28 at 1pm.

## 2021-04-05 ENCOUNTER — Encounter: Payer: Self-pay | Admitting: Oncology

## 2021-04-05 ENCOUNTER — Other Ambulatory Visit: Payer: Self-pay

## 2021-04-05 ENCOUNTER — Inpatient Hospital Stay (HOSPITAL_BASED_OUTPATIENT_CLINIC_OR_DEPARTMENT_OTHER): Payer: Medicare HMO | Admitting: Oncology

## 2021-04-05 ENCOUNTER — Inpatient Hospital Stay: Payer: Medicare HMO

## 2021-04-05 ENCOUNTER — Inpatient Hospital Stay: Payer: Medicare HMO | Attending: Oncology

## 2021-04-05 VITALS — BP 137/88 | HR 84 | Temp 97.4°F | Wt 161.0 lb

## 2021-04-05 DIAGNOSIS — Z91030 Bee allergy status: Secondary | ICD-10-CM | POA: Insufficient documentation

## 2021-04-05 DIAGNOSIS — E785 Hyperlipidemia, unspecified: Secondary | ICD-10-CM | POA: Insufficient documentation

## 2021-04-05 DIAGNOSIS — D508 Other iron deficiency anemias: Secondary | ICD-10-CM

## 2021-04-05 DIAGNOSIS — J9621 Acute and chronic respiratory failure with hypoxia: Secondary | ICD-10-CM | POA: Insufficient documentation

## 2021-04-05 DIAGNOSIS — I272 Pulmonary hypertension, unspecified: Secondary | ICD-10-CM | POA: Insufficient documentation

## 2021-04-05 DIAGNOSIS — J449 Chronic obstructive pulmonary disease, unspecified: Secondary | ICD-10-CM | POA: Insufficient documentation

## 2021-04-05 DIAGNOSIS — R0602 Shortness of breath: Secondary | ICD-10-CM | POA: Diagnosis not present

## 2021-04-05 DIAGNOSIS — D509 Iron deficiency anemia, unspecified: Secondary | ICD-10-CM | POA: Diagnosis not present

## 2021-04-05 DIAGNOSIS — Z79899 Other long term (current) drug therapy: Secondary | ICD-10-CM | POA: Insufficient documentation

## 2021-04-05 DIAGNOSIS — E538 Deficiency of other specified B group vitamins: Secondary | ICD-10-CM

## 2021-04-05 DIAGNOSIS — Z8249 Family history of ischemic heart disease and other diseases of the circulatory system: Secondary | ICD-10-CM | POA: Diagnosis not present

## 2021-04-05 DIAGNOSIS — R911 Solitary pulmonary nodule: Secondary | ICD-10-CM

## 2021-04-05 DIAGNOSIS — Z87891 Personal history of nicotine dependence: Secondary | ICD-10-CM | POA: Diagnosis not present

## 2021-04-05 DIAGNOSIS — Z9049 Acquired absence of other specified parts of digestive tract: Secondary | ICD-10-CM | POA: Diagnosis not present

## 2021-04-05 DIAGNOSIS — E611 Iron deficiency: Secondary | ICD-10-CM

## 2021-04-05 DIAGNOSIS — Z923 Personal history of irradiation: Secondary | ICD-10-CM | POA: Diagnosis not present

## 2021-04-05 DIAGNOSIS — C3412 Malignant neoplasm of upper lobe, left bronchus or lung: Secondary | ICD-10-CM | POA: Insufficient documentation

## 2021-04-05 DIAGNOSIS — C3492 Malignant neoplasm of unspecified part of left bronchus or lung: Secondary | ICD-10-CM | POA: Diagnosis not present

## 2021-04-05 DIAGNOSIS — J432 Centrilobular emphysema: Secondary | ICD-10-CM | POA: Diagnosis not present

## 2021-04-05 DIAGNOSIS — R5383 Other fatigue: Secondary | ICD-10-CM | POA: Diagnosis not present

## 2021-04-05 LAB — CBC WITH DIFFERENTIAL/PLATELET
Abs Immature Granulocytes: 0.05 10*3/uL (ref 0.00–0.07)
Basophils Absolute: 0.1 10*3/uL (ref 0.0–0.1)
Basophils Relative: 1 %
Eosinophils Absolute: 0.6 10*3/uL — ABNORMAL HIGH (ref 0.0–0.5)
Eosinophils Relative: 6 %
HCT: 36.3 % — ABNORMAL LOW (ref 39.0–52.0)
Hemoglobin: 11.1 g/dL — ABNORMAL LOW (ref 13.0–17.0)
Immature Granulocytes: 1 %
Lymphocytes Relative: 15 %
Lymphs Abs: 1.6 10*3/uL (ref 0.7–4.0)
MCH: 29.5 pg (ref 26.0–34.0)
MCHC: 30.6 g/dL (ref 30.0–36.0)
MCV: 96.5 fL (ref 80.0–100.0)
Monocytes Absolute: 0.7 10*3/uL (ref 0.1–1.0)
Monocytes Relative: 6 %
Neutro Abs: 8.1 10*3/uL — ABNORMAL HIGH (ref 1.7–7.7)
Neutrophils Relative %: 71 %
Platelets: 218 10*3/uL (ref 150–400)
RBC: 3.76 MIL/uL — ABNORMAL LOW (ref 4.22–5.81)
RDW: 15.3 % (ref 11.5–15.5)
WBC: 11.1 10*3/uL — ABNORMAL HIGH (ref 4.0–10.5)
nRBC: 0 % (ref 0.0–0.2)

## 2021-04-05 LAB — IRON AND TIBC
Iron: 48 ug/dL (ref 45–182)
Saturation Ratios: 17 % — ABNORMAL LOW (ref 17.9–39.5)
TIBC: 288 ug/dL (ref 250–450)
UIBC: 240 ug/dL

## 2021-04-05 LAB — FERRITIN: Ferritin: 110 ng/mL (ref 24–336)

## 2021-04-05 LAB — VITAMIN B12: Vitamin B-12: 456 pg/mL (ref 180–914)

## 2021-04-05 NOTE — Progress Notes (Signed)
Survivorship Care Plan visit completed.  Treatment summary reviewed and given to patient.  ASCO answers booklet reviewed and given to patient.  CARE program and Cancer Transitions discussed with patient along with other resources cancer center offers to patients and caregivers.  Patient verbalized understanding.    

## 2021-04-05 NOTE — Progress Notes (Signed)
Hematology/Oncology Consult note [facility]  [phone] [phone]  Patient Care Team: Remi Haggard, FNP as PCP - General (Family Medicine) Telford Nab, RN as Oncology Nurse Navigator Noreene Filbert, MD as Referring Physician (Radiation Oncology) Sindy Guadeloupe, MD as Consulting Physician (Hematology and Oncology)   Name of the patient: Christian Fields  [id number]  1956/03/25   Date of visit: 04/05/21  Diagnosis- stage I lung cancer s/p radiation Normocytic anemia   Chief complaint/ Reason for visit- routine f/u of lung cancer  Heme/Onc history: Patient is a 65 year old male who was admitted to the hospital in April 2021 for community-acquired pneumonia and as a part of the work-up he had a CT angio chest done which incidentally showed a 1 cm irregular left upper lobe lung nodule repeat CT scan in August 2021 showed an interval increase in the size from 1 to 1.4 cm. No appreciable thoracic adenopathy. Severe centrilobular emphysema. Chronic pulmonary hypertension. Patient has been seen by Dr. Raul Del and has been considered to be high risk for biopsy from their standpoint.He underwent SBRT to the left upper lobe lung lesion. Patient now referred for anemia  Most recent labs from September 2021 showed a normal liver and kidney function. TSH was normal. Vitamin D levels were low. CBC showed white count of 12.6, H&H of 10.9/37 with an MCV of 102 and a platelet count of 358.  Anemia work-up from 10/27/2020 showed a mildly low ferritin level of 28 with an iron saturation of 8% myeloma panel, serum free light chains, haptoglobin, TSH and LDH was normal.  B12 levels were low at 166.  Interval history-patient recently was discharged from the hospital when he was admitted for acute on chronic hypoxic respiratory failure and septic shock requiring vasopressor support as well as BiPAP.  He does not feel quite back to his baseline at  this time.  ECOG PS- 2 Pain scale- 0   Review of systems- Review of Systems  Constitutional: Positive for malaise/fatigue. Negative for chills, fever and weight loss.  HENT: Negative for congestion, ear discharge and nosebleeds.   Eyes: Negative for blurred vision.  Respiratory: Positive for shortness of breath. Negative for cough, hemoptysis, sputum production and wheezing.   Cardiovascular: Negative for chest pain, palpitations, orthopnea and claudication.  Gastrointestinal: Negative for abdominal pain, blood in stool, constipation, diarrhea, heartburn, melena, nausea and vomiting.  Genitourinary: Negative for dysuria, flank pain, frequency, hematuria and urgency.  Musculoskeletal: Negative for back pain, joint pain and myalgias.  Skin: Negative for rash.  Neurological: Negative for dizziness, tingling, focal weakness, seizures, weakness and headaches.  Endo/Heme/Allergies: Does not bruise/bleed easily.  Psychiatric/Behavioral: Negative for depression and suicidal ideas. The patient does not have insomnia.      Allergies  Allergen Reactions  . Bee Venom Anaphylaxis     Past Medical History:  Diagnosis Date  . Anemia   . Anxiety   . Arthritis   . Asthma   . Cancer ([place])    Basal Cell Skin Cancer  . Chronic back pain   . COPD (chronic obstructive pulmonary disease) ([place])   . Depression   . Diabetes mellitus ([place])   . Dyspnea   . GERD (gastroesophageal reflux disease)   . Gout   . Gout   . Headache   . History of blood clots    Left Leg--July 2018  . History of kidney stones   . Hyperlipidemia   . Hyperlipidemia   . Hypertension   .  Kidney stones   . Neuropathy   . On home oxygen therapy    2 L / M  . Pneumonia 06/2017  . Sleep apnea   . Ulcer of foot ([place])    Right     Past Surgical History:  Procedure Laterality Date  . APPENDECTOMY    . DG FEET 2 VIEWS BILAT    . LIPOMA EXCISION Right 08/15/2017   Procedure: EXCISION TUMOR(CYST) FOOT;  Surgeon:  Albertine Patricia, DPM;  Location: [facility] ORS;  Service: Podiatry;  Laterality: Right;  . OTHER SURGICAL HISTORY Bilateral Foot surgery    Social History   Socioeconomic History  . Marital status: Widowed    Spouse name: Not on file  . Number of children: Not on file  . Years of education: Not on file  . Highest education level: Not on file  Occupational History  . Not on file  Tobacco Use  . Smoking status: Former Smoker    Packs/day: 0.50    Types: Cigarettes    Start date: 2018  . Smokeless tobacco: Never Used  Vaping Use  . Vaping Use: Never used  Substance and Sexual Activity  . Alcohol use: Yes    Alcohol/week: 1.0 standard drink    Types: 1 Cans of beer per week    Comment: occ  . Drug use: No  . Sexual activity: Not on file  Other Topics Concern  . Not on file  Social History Narrative  . Not on file   Social Determinants of Health   Financial Resource Strain: Not on file  Food Insecurity: Not on file  Transportation Needs: Not on file  Physical Activity: Not on file  Stress: Not on file  Social Connections: Not on file  Intimate Partner Violence: Not on file    Family History  Problem Relation Age of Onset  . Hypertension Mother      Current Outpatient Medications:  .  albuterol (VENTOLIN HFA) 108 (90 Base) MCG/ACT inhaler, Inhale 2 puffs into the lungs every 4 (four) hours as needed for wheezing or shortness of breath., Disp: , Rfl:  .  allopurinol (ZYLOPRIM) 300 MG tablet, Take 600 mg by mouth 2 (two) times daily., Disp: , Rfl:  .  aspirin EC 81 MG tablet, Take 1 tablet by mouth daily., Disp: , Rfl:  .  cholecalciferol (VITAMIN D) 1000 units tablet, Take 1,000 Units by mouth daily., Disp: , Rfl:  .  clopidogrel (PLAVIX) 75 MG tablet, Take 1 tablet (75 mg total) by mouth daily., Disp: 30 tablet, Rfl: 1 .  DULoxetine (CYMBALTA) 30 MG capsule, Take 1 capsule by mouth daily., Disp: , Rfl:  .  ezetimibe (ZETIA) 10 MG tablet, Take 10 mg by mouth daily.,  Disp: , Rfl:  .  famotidine (PEPCID) 20 MG tablet, Take 1 tablet (20 mg total) by mouth 2 (two) times daily., Disp: 60 tablet, Rfl: 0 .  FARXIGA 10 MG TABS tablet, Take 10 mg by mouth daily., Disp: , Rfl:  .  gabapentin (NEURONTIN) 100 MG capsule, Take 2 capsules (200 mg total) by mouth 4 (four) times daily. (Patient taking differently: Take 800 mg by mouth 4 (four) times daily.), Disp: 240 capsule, Rfl: 0 .  KOMBIGLYZE XR 2.04-999 MG TB24, Take 2 tablets by mouth daily., Disp: , Rfl:  .  metoprolol succinate (TOPROL-XL) 50 MG 24 hr tablet, Take 50 mg by mouth daily., Disp: , Rfl:  .  OXYGEN, Inhale 2 L into the lungs., Disp: , Rfl:  .  simvastatin (ZOCOR) 40 MG tablet, Take 1 tablet by mouth daily., Disp: , Rfl:  .  TRELEGY ELLIPTA 100-62.5-25 MCG/INH AEPB, Inhale 1 puff into the lungs daily., Disp: , Rfl:  .  ferrous sulfate 325 (65 FE) MG tablet, Take 325 mg by mouth daily with breakfast. (Patient not taking: Reported on 04/05/2021), Disp: , Rfl:   Physical exam:  Vitals:   04/05/21 1327  BP: 137/88  Pulse: 84  Temp: (!) 97.4 F (36.3 C)  TempSrc: Tympanic  SpO2: 97%  Weight: 161 lb (73 kg)   Physical Exam Constitutional:      General: He is not in acute distress.    Comments: He is sitting in a wheelchair  Cardiovascular:     Rate and Rhythm: Normal rate and regular rhythm.     Heart sounds: Normal heart sounds.  Pulmonary:     Comments: Effort of breathing increased.  Breath sounds decreased bilaterally diffusely Abdominal:     General: Bowel sounds are normal.     Palpations: Abdomen is soft.  Skin:    General: Skin is warm and dry.  Neurological:     Mental Status: He is alert and oriented to person, place, and time.      CMP Latest Ref Rng & Units 03/09/2021  Glucose 70 - 99 mg/dL 95  BUN 8 - 23 mg/dL 13  Creatinine 0.61 - 1.24 mg/dL 1.35(H)  Sodium 135 - 145 mmol/L 135  Potassium 3.5 - 5.1 mmol/L 3.9  Chloride 98 - 111 mmol/L 96(L)  CO2 22 - 32 mmol/L 25   Calcium 8.9 - 10.3 mg/dL 9.7  Total Protein 6.5 - 8.1 g/dL -  Total Bilirubin 0.3 - 1.2 mg/dL -  Alkaline Phos 38 - 126 U/L -  AST 15 - 41 U/L -  ALT 0 - 44 U/L -   CBC Latest Ref Rng & Units 04/05/2021  WBC 4.0 - 10.5 K/uL 11.1(H)  Hemoglobin 13.0 - 17.0 g/dL 11.1(L)  Hematocrit 39.0 - 52.0 % 36.3(L)  Platelets 150 - 400 K/uL 218      Assessment and plan- Patient is a 65 y.o. male who is here for the follow-up of following issues:  1.  Stage I lung cancer: S/p SBRT Most recent scans from March 2022 showed stable size of left upper lobe lung nodule measuring 1.2 x 1 cm.  No signs of progressive disease.  Prominent subcentimeter lymph nodes in the mediastinum likely reactive.  We will continue to monitor his lung cancer with a repeat CT scan in 6 months in September 2022.  2.  Iron deficiency anemia: Patient is currently mildly anemic with a hemoglobin of 11.1 which is better as compared to his prior values.Iron studies are not indicative of iron deficiency.  We will hold off on giving him IV iron at this time.  Repeat CBC ferritin and iron studies in 6 months   Visit Diagnosis 1. Iron deficiency   2. NSCLC of left lung ([place])      Dr. Randa Evens, MD, MPH [facility] [phone] 04/05/2021 3:59 PM

## 2021-04-18 ENCOUNTER — Ambulatory Visit: Payer: Medicare HMO | Admitting: Radiation Oncology

## 2021-04-18 ENCOUNTER — Ambulatory Visit
Admission: RE | Admit: 2021-04-18 | Discharge: 2021-04-18 | Disposition: A | Discharge status: Home / Self Care | Payer: Medicare HMO | Source: Ambulatory Visit | Attending: Radiation Oncology | Admitting: Radiation Oncology

## 2021-04-18 ENCOUNTER — Other Ambulatory Visit: Payer: Self-pay

## 2021-04-18 VITALS — BP 130/72 | HR 94 | Temp 97.7°F | Wt 164.0 lb

## 2021-04-18 DIAGNOSIS — C3412 Malignant neoplasm of upper lobe, left bronchus or lung: Secondary | ICD-10-CM | POA: Insufficient documentation

## 2021-04-18 DIAGNOSIS — D509 Iron deficiency anemia, unspecified: Secondary | ICD-10-CM | POA: Diagnosis not present

## 2021-04-18 DIAGNOSIS — Z923 Personal history of irradiation: Secondary | ICD-10-CM | POA: Insufficient documentation

## 2021-04-18 DIAGNOSIS — R918 Other nonspecific abnormal finding of lung field: Secondary | ICD-10-CM

## 2021-04-18 NOTE — Progress Notes (Signed)
Radiation Oncology Follow up Note  Name: Christian Fields   Date:   04/18/2021 MRN:  [id number] DOB: 1956/03/05    This 65 y.o. male presents to the clinic today for 6-month follow-up status post SBRT for stage I non-small cell lung cancer left upper lobe.  REFERRING PROVIDER: Remi Haggard, FNP  HPI: Patient is a 65 year old male now out 6 months having completed SBRT to his left upper lobe for stage I non-small cell lung cancer.  Seen today in routine follow-up he is doing well he is on continual nasal oxygen.  Occasionally has a slight brownish cough no hemoptysis or chest tightness..  He had a CT scan back in March which I have reviewed showing no significant interval change in a 1.2 cm peripheral left upper lobe nodule consistent with treatment response.  He does have stable multiple other small nodules measuring 4 mm or smaller.  He is being treated for iron deficiency anemia.  He has a CT scan scheduled of his chest in September.  COMPLICATIONS OF TREATMENT: none  FOLLOW UP COMPLIANCE: keeps appointments   PHYSICAL EXAM:  BP 130/72   Pulse 94   Temp 97.7 F (36.5 C) (Tympanic)   Wt 164 lb (74.4 kg)   SpO2 90% Comment: on 2 liters o2  BMI 24.94 kg/m  Patient is currently on nasal oxygen.  Well-developed well-nourished patient in NAD. HEENT reveals PERLA, EOMI, discs not visualized.  Oral cavity is clear. No oral mucosal lesions are identified. Neck is clear without evidence of cervical or supraclavicular adenopathy. Lungs are clear to A&P. Cardiac examination is essentially unremarkable with regular rate and rhythm without murmur rub or thrill. Abdomen is benign with no organomegaly or masses noted. Motor sensory and DTR levels are equal and symmetric in the upper and lower extremities. Cranial nerves II through XII are grossly intact. Proprioception is intact. No peripheral adenopathy or edema is identified. No motor or sensory levels are noted. Crude visual fields are within normal  range.  RADIOLOGY RESULTS: CT scan reviewed compatible with above-stated findings  PLAN: Present time patient is doing well with stable disease in his chest.  On pleased with his overall progress.  Of asked to see him back in 6 months for follow-up.  We will review his CT scans at that time in September.  Patient knows to call with any concerns at any time.  I would like to take this opportunity to thank you for allowing me to participate in the care of your patient.Noreene Filbert, MD

## 2021-07-05 ENCOUNTER — Ambulatory Visit: Payer: Medicare HMO

## 2021-07-26 ENCOUNTER — Other Ambulatory Visit: Payer: Self-pay

## 2021-07-26 ENCOUNTER — Encounter: Payer: Self-pay | Admitting: Radiation Oncology

## 2021-07-26 ENCOUNTER — Ambulatory Visit
Admission: RE | Admit: 2021-07-26 | Discharge: 2021-07-26 | Disposition: A | Discharge status: Home / Self Care | Payer: Medicare HMO | Source: Ambulatory Visit | Attending: Radiation Oncology | Admitting: Radiation Oncology

## 2021-07-26 VITALS — BP 138/86 | HR 76 | Temp 97.8°F | Wt 176.0 lb

## 2021-07-26 DIAGNOSIS — Z923 Personal history of irradiation: Secondary | ICD-10-CM | POA: Diagnosis not present

## 2021-07-26 DIAGNOSIS — C3412 Malignant neoplasm of upper lobe, left bronchus or lung: Secondary | ICD-10-CM | POA: Insufficient documentation

## 2021-07-26 DIAGNOSIS — R918 Other nonspecific abnormal finding of lung field: Secondary | ICD-10-CM

## 2021-07-26 NOTE — Progress Notes (Signed)
Radiation Oncology Follow up Note  Name: Christian Fields   Date:   07/26/2021 MRN:  [id number] DOB: 05/19/1956    This 65 y.o. male presents to the clinic today for patient is a 65 year old male now out 8 months having completed SBRT to his left upper lobe for stage I non-small cell lung cancer.  Seen today in routine follow-up he is doing fair.  He is on nasal oxygen wheelchair-bound he has been having some bouts of bronchitis and pneumonia being managed by Dr. Raul Del.  He has a CT scan next month his last CT scan.  Back in March showed no significant interval change in the irregular nodule of the left upper lobe with findings consistent with stable disease and radiation changes.  REFERRING PROVIDER: Remi Haggard, FNP  HPI: As above.  COMPLICATIONS OF TREATMENT: none  FOLLOW UP COMPLIANCE: keeps appointments   PHYSICAL EXAM:  BP 138/86   Pulse 76   Temp 97.8 F (36.6 C) (Tympanic)   Wt 176 lb (79.8 kg)   SpO2 90% Comment: 2 L o2  BMI 26.76 kg/m  Frail-appearing wheelchair-bound male on nasal oxygen in NAD.  Well-developed well-nourished patient in NAD. HEENT reveals PERLA, EOMI, discs not visualized.  Oral cavity is clear. No oral mucosal lesions are identified. Neck is clear without evidence of cervical or supraclavicular adenopathy. Lungs are clear to A&P. Cardiac examination is essentially unremarkable with regular rate and rhythm without murmur rub or thrill. Abdomen is benign with no organomegaly or masses noted. Motor sensory and DTR levels are equal and symmetric in the upper and lower extremities. Cranial nerves II through XII are grossly intact. Proprioception is intact. No peripheral adenopathy or edema is identified. No motor or sensory levels are noted. Crude visual fields are within normal range.  RADIOLOGY RESULTS: CT scan reviewed we will review his CT scans in September when they are available  PLAN: Present time patient appears stable.  We will review his CT scans  as they become available have asked to see him back in 6 months for follow-up.  He continues close follow-up care with Dr. Raul Del.  Patient and family know to call with any concerns.  I would like to take this opportunity to thank you for allowing me to participate in the care of your patient.Noreene Filbert, MD

## 2021-08-02 ENCOUNTER — Emergency Department: Payer: Medicare Other

## 2021-08-02 ENCOUNTER — Inpatient Hospital Stay
Admission: EM | Admit: 2021-08-02 | Discharge: 2021-08-04 | DRG: 190 | Disposition: A | Discharge status: Home Health | Payer: Medicare Other | Source: Ambulatory Visit | Attending: Internal Medicine | Admitting: Internal Medicine

## 2021-08-02 ENCOUNTER — Other Ambulatory Visit: Payer: Self-pay

## 2021-08-02 DIAGNOSIS — J441 Chronic obstructive pulmonary disease with (acute) exacerbation: Principal | ICD-10-CM | POA: Diagnosis present

## 2021-08-02 DIAGNOSIS — E785 Hyperlipidemia, unspecified: Secondary | ICD-10-CM | POA: Diagnosis present

## 2021-08-02 DIAGNOSIS — Z7982 Long term (current) use of aspirin: Secondary | ICD-10-CM

## 2021-08-02 DIAGNOSIS — G894 Chronic pain syndrome: Secondary | ICD-10-CM | POA: Diagnosis not present

## 2021-08-02 DIAGNOSIS — E119 Type 2 diabetes mellitus without complications: Secondary | ICD-10-CM | POA: Diagnosis present

## 2021-08-02 DIAGNOSIS — Z7902 Long term (current) use of antithrombotics/antiplatelets: Secondary | ICD-10-CM | POA: Diagnosis not present

## 2021-08-02 DIAGNOSIS — Z79899 Other long term (current) drug therapy: Secondary | ICD-10-CM | POA: Diagnosis not present

## 2021-08-02 DIAGNOSIS — Z7951 Long term (current) use of inhaled steroids: Secondary | ICD-10-CM

## 2021-08-02 DIAGNOSIS — F419 Anxiety disorder, unspecified: Secondary | ICD-10-CM | POA: Diagnosis present

## 2021-08-02 DIAGNOSIS — J9621 Acute and chronic respiratory failure with hypoxia: Secondary | ICD-10-CM | POA: Diagnosis present

## 2021-08-02 DIAGNOSIS — G4733 Obstructive sleep apnea (adult) (pediatric): Secondary | ICD-10-CM | POA: Diagnosis present

## 2021-08-02 DIAGNOSIS — Z7984 Long term (current) use of oral hypoglycemic drugs: Secondary | ICD-10-CM

## 2021-08-02 DIAGNOSIS — Z923 Personal history of irradiation: Secondary | ICD-10-CM | POA: Diagnosis not present

## 2021-08-02 DIAGNOSIS — E11621 Type 2 diabetes mellitus with foot ulcer: Secondary | ICD-10-CM | POA: Diagnosis present

## 2021-08-02 DIAGNOSIS — F17200 Nicotine dependence, unspecified, uncomplicated: Secondary | ICD-10-CM | POA: Diagnosis present

## 2021-08-02 DIAGNOSIS — Z8616 Personal history of COVID-19: Secondary | ICD-10-CM | POA: Diagnosis not present

## 2021-08-02 DIAGNOSIS — Z9981 Dependence on supplemental oxygen: Secondary | ICD-10-CM | POA: Diagnosis not present

## 2021-08-02 DIAGNOSIS — Z86718 Personal history of other venous thrombosis and embolism: Secondary | ICD-10-CM | POA: Diagnosis not present

## 2021-08-02 DIAGNOSIS — I10 Essential (primary) hypertension: Secondary | ICD-10-CM | POA: Diagnosis not present

## 2021-08-02 DIAGNOSIS — Z8249 Family history of ischemic heart disease and other diseases of the circulatory system: Secondary | ICD-10-CM | POA: Diagnosis not present

## 2021-08-02 DIAGNOSIS — Z85118 Personal history of other malignant neoplasm of bronchus and lung: Secondary | ICD-10-CM | POA: Diagnosis not present

## 2021-08-02 DIAGNOSIS — G4730 Sleep apnea, unspecified: Secondary | ICD-10-CM | POA: Diagnosis present

## 2021-08-02 DIAGNOSIS — Z87891 Personal history of nicotine dependence: Secondary | ICD-10-CM

## 2021-08-02 DIAGNOSIS — Z85828 Personal history of other malignant neoplasm of skin: Secondary | ICD-10-CM | POA: Diagnosis not present

## 2021-08-02 DIAGNOSIS — F1920 Other psychoactive substance dependence, uncomplicated: Secondary | ICD-10-CM | POA: Diagnosis present

## 2021-08-02 LAB — BASIC METABOLIC PANEL
Anion gap: 9 (ref 5–15)
BUN: 19 mg/dL (ref 8–23)
CO2: 28 mmol/L (ref 22–32)
Calcium: 8.1 mg/dL — ABNORMAL LOW (ref 8.9–10.3)
Chloride: 100 mmol/L (ref 98–111)
Creatinine, Ser: 1.11 mg/dL (ref 0.61–1.24)
GFR, Estimated: >60 mL/min (ref >60)
Glucose, Bld: 89 mg/dL (ref 70–99)
Potassium: 4.3 mmol/L (ref 3.5–5.1)
Sodium: 137 mmol/L (ref 135–145)

## 2021-08-02 LAB — CBC WITH DIFFERENTIAL/PLATELET
Abs Immature Granulocytes: 0.06 10*3/uL (ref 0.00–0.07)
Basophils Absolute: 0.1 10*3/uL (ref 0.0–0.1)
Basophils Relative: 1 %
Eosinophils Absolute: 0.3 10*3/uL (ref 0.0–0.5)
Eosinophils Relative: 3 %
HCT: 34 % — ABNORMAL LOW (ref 39.0–52.0)
Hemoglobin: 10.8 g/dL — ABNORMAL LOW (ref 13.0–17.0)
Immature Granulocytes: 1 %
Lymphocytes Relative: 17 %
Lymphs Abs: 1.6 10*3/uL (ref 0.7–4.0)
MCH: 30.9 pg (ref 26.0–34.0)
MCHC: 31.8 g/dL (ref 30.0–36.0)
MCV: 97.1 fL (ref 80.0–100.0)
Monocytes Absolute: 0.4 10*3/uL (ref 0.1–1.0)
Monocytes Relative: 5 %
Neutro Abs: 7.2 10*3/uL (ref 1.7–7.7)
Neutrophils Relative %: 73 %
Platelets: 198 10*3/uL (ref 150–400)
RBC: 3.5 MIL/uL — ABNORMAL LOW (ref 4.22–5.81)
RDW: 14.7 % (ref 11.5–15.5)
WBC: 9.6 10*3/uL (ref 4.0–10.5)
nRBC: 0 % (ref 0.0–0.2)

## 2021-08-02 LAB — GLUCOSE, CAPILLARY: Glucose-Capillary: 210 mg/dL — ABNORMAL HIGH (ref 70–99)

## 2021-08-02 LAB — RESP PANEL BY RT-PCR (FLU A&B, COVID) ARPGX2
Influenza A by PCR: NEGATIVE
Influenza B by PCR: NEGATIVE
SARS Coronavirus 2 by RT PCR: NEGATIVE

## 2021-08-02 MED ORDER — ALBUTEROL SULFATE (2.5 MG/3ML) 0.083% IN NEBU
2.5000 mg | INHALATION_SOLUTION | RESPIRATORY_TRACT | Status: DC | PRN
  Administered 2021-08-03 – 2021-08-04 (×5): 2.5 mg via RESPIRATORY_TRACT
  Filled 2021-08-02 (×6): qty 3

## 2021-08-02 MED ORDER — CLONAZEPAM 1 MG PO TABS
1.0000 mg | ORAL_TABLET | Freq: Three times a day (TID) | ORAL | Status: DC | PRN
  Administered 2021-08-03 – 2021-08-04 (×4): 1 mg via ORAL
  Filled 2021-08-02 (×5): qty 1

## 2021-08-02 MED ORDER — ASPIRIN EC 81 MG PO TBEC
81.0000 mg | DELAYED_RELEASE_TABLET | Freq: Every day | ORAL | Status: DC
  Administered 2021-08-03 – 2021-08-04 (×2): 81 mg via ORAL
  Filled 2021-08-02 (×2): qty 1

## 2021-08-02 MED ORDER — BUPRENORPHINE HCL-NALOXONE HCL 8-2 MG SL SUBL
1.0000 | SUBLINGUAL_TABLET | Freq: Every day | SUBLINGUAL | Status: DC
  Administered 2021-08-03 – 2021-08-04 (×2): 1 via SUBLINGUAL
  Filled 2021-08-02 (×2): qty 1

## 2021-08-02 MED ORDER — INSULIN ASPART 100 UNIT/ML IJ SOLN
0.0000 [IU] | Freq: Every day | INTRAMUSCULAR | Status: DC
  Administered 2021-08-03: 2 [IU] via SUBCUTANEOUS
  Administered 2021-08-03: 4 [IU] via SUBCUTANEOUS
  Filled 2021-08-02 (×2): qty 1

## 2021-08-02 MED ORDER — INSULIN ASPART 100 UNIT/ML IJ SOLN
0.0000 [IU] | Freq: Three times a day (TID) | INTRAMUSCULAR | Status: DC
  Administered 2021-08-03 (×3): 4 [IU] via SUBCUTANEOUS
  Administered 2021-08-04 (×2): 3 [IU] via SUBCUTANEOUS
  Filled 2021-08-02 (×5): qty 1

## 2021-08-02 MED ORDER — ALLOPURINOL 100 MG PO TABS
600.0000 mg | ORAL_TABLET | Freq: Every day | ORAL | Status: DC
  Administered 2021-08-03 – 2021-08-04 (×2): 600 mg via ORAL
  Filled 2021-08-02 (×2): qty 6

## 2021-08-02 MED ORDER — IPRATROPIUM-ALBUTEROL 0.5-2.5 (3) MG/3ML IN SOLN
3.0000 mL | Freq: Once | RESPIRATORY_TRACT | Status: AC
  Administered 2021-08-02: 3 mL via RESPIRATORY_TRACT
  Filled 2021-08-02: qty 3

## 2021-08-02 MED ORDER — PREDNISONE 20 MG PO TABS
40.0000 mg | ORAL_TABLET | Freq: Every day | ORAL | Status: DC
  Administered 2021-08-04: 40 mg via ORAL
  Filled 2021-08-02: qty 2

## 2021-08-02 MED ORDER — SAXAGLIPTIN-METFORMIN ER 2.5-1000 MG PO TB24: 2.0000 | ORAL_TABLET | Freq: Every day | ORAL | Status: DC

## 2021-08-02 MED ORDER — FLUTICASONE-UMECLIDIN-VILANT 100-62.5-25 MCG/INH IN AEPB: 1.0000 | INHALATION_SPRAY | Freq: Every day | RESPIRATORY_TRACT | Status: DC

## 2021-08-02 MED ORDER — METHYLPREDNISOLONE SODIUM SUCC 125 MG IJ SOLR
125.0000 mg | Freq: Two times a day (BID) | INTRAMUSCULAR | Status: AC
  Administered 2021-08-03 (×2): 125 mg via INTRAVENOUS
  Filled 2021-08-02 (×2): qty 2

## 2021-08-02 MED ORDER — VITAMIN D 25 MCG (1000 UNIT) PO TABS
1000.0000 [IU] | ORAL_TABLET | Freq: Every day | ORAL | Status: DC
  Administered 2021-08-03 – 2021-08-04 (×2): 1000 [IU] via ORAL
  Filled 2021-08-02 (×2): qty 1

## 2021-08-02 MED ORDER — SIMVASTATIN 40 MG PO TABS
40.0000 mg | ORAL_TABLET | Freq: Every day | ORAL | Status: DC
  Administered 2021-08-03 – 2021-08-04 (×2): 40 mg via ORAL
  Filled 2021-08-02 (×2): qty 1

## 2021-08-02 MED ORDER — METOPROLOL SUCCINATE ER 50 MG PO TB24
50.0000 mg | ORAL_TABLET | Freq: Every day | ORAL | Status: DC
  Administered 2021-08-03 – 2021-08-04 (×2): 50 mg via ORAL
  Filled 2021-08-02 (×2): qty 1

## 2021-08-02 MED ORDER — ALBUTEROL SULFATE HFA 108 (90 BASE) MCG/ACT IN AERS: 2.0000 | INHALATION_SPRAY | RESPIRATORY_TRACT | Status: DC | PRN

## 2021-08-02 MED ORDER — DAPAGLIFLOZIN PROPANEDIOL 5 MG PO TABS
10.0000 mg | ORAL_TABLET | Freq: Every day | ORAL | Status: DC
  Administered 2021-08-03 – 2021-08-04 (×2): 10 mg via ORAL
  Filled 2021-08-02 (×2): qty 2

## 2021-08-02 MED ORDER — IPRATROPIUM BROMIDE 0.02 % IN SOLN
0.5000 mg | Freq: Four times a day (QID) | RESPIRATORY_TRACT | Status: DC
  Administered 2021-08-03 (×2): 0.5 mg via RESPIRATORY_TRACT
  Filled 2021-08-02 (×2): qty 2.5

## 2021-08-02 MED ORDER — PANTOPRAZOLE SODIUM 40 MG PO TBEC
40.0000 mg | DELAYED_RELEASE_TABLET | Freq: Every day | ORAL | Status: DC
  Administered 2021-08-03 – 2021-08-04 (×2): 40 mg via ORAL
  Filled 2021-08-02 (×2): qty 1

## 2021-08-02 MED ORDER — IOHEXOL 350 MG/ML SOLN
75.0000 mL | Freq: Once | INTRAVENOUS | Status: AC | PRN
  Administered 2021-08-02: 75 mL via INTRAVENOUS
  Filled 2021-08-02: qty 75

## 2021-08-02 MED ORDER — CLOPIDOGREL BISULFATE 75 MG PO TABS
75.0000 mg | ORAL_TABLET | Freq: Every day | ORAL | Status: DC
  Administered 2021-08-03 – 2021-08-04 (×2): 75 mg via ORAL
  Filled 2021-08-02 (×2): qty 1

## 2021-08-02 MED ORDER — EZETIMIBE 10 MG PO TABS
10.0000 mg | ORAL_TABLET | Freq: Every day | ORAL | Status: DC
  Administered 2021-08-03 – 2021-08-04 (×2): 10 mg via ORAL
  Filled 2021-08-02 (×2): qty 1

## 2021-08-02 MED ORDER — GABAPENTIN 400 MG PO CAPS
800.0000 mg | ORAL_CAPSULE | Freq: Four times a day (QID) | ORAL | Status: DC
  Administered 2021-08-03 – 2021-08-04 (×7): 800 mg via ORAL
  Filled 2021-08-02 (×7): qty 2

## 2021-08-02 MED ORDER — DULOXETINE HCL 30 MG PO CPEP
30.0000 mg | ORAL_CAPSULE | Freq: Every day | ORAL | Status: DC
  Administered 2021-08-03 – 2021-08-04 (×2): 30 mg via ORAL
  Filled 2021-08-02 (×2): qty 1

## 2021-08-02 MED ORDER — METHYLPREDNISOLONE SODIUM SUCC 125 MG IJ SOLR
125.0000 mg | Freq: Once | INTRAMUSCULAR | Status: AC
  Administered 2021-08-02: 125 mg via INTRAVENOUS
  Filled 2021-08-02: qty 2

## 2021-08-02 MED ORDER — DOXYCYCLINE HYCLATE 100 MG PO TABS
100.0000 mg | ORAL_TABLET | Freq: Two times a day (BID) | ORAL | Status: DC
  Administered 2021-08-03 – 2021-08-04 (×4): 100 mg via ORAL
  Filled 2021-08-02 (×4): qty 1

## 2021-08-02 MED ORDER — ENOXAPARIN SODIUM 40 MG/0.4ML IJ SOSY
40.0000 mg | PREFILLED_SYRINGE | INTRAMUSCULAR | Status: DC
  Administered 2021-08-03 – 2021-08-04 (×2): 40 mg via SUBCUTANEOUS
  Filled 2021-08-02 (×2): qty 0.4

## 2021-08-02 NOTE — H&P (Signed)
History and Physical   Christian Fields RJJ:[id number] DOB: 08/10/1956 DOA: 08/02/2021  Referring MD/NP/PA: Dr. Quentin Cornwall  PCP: Remi Haggard, FNP   Outpatient Specialists: None  Patient coming from: Home  Chief Complaint: Shortness of breath  HPI: Christian Fields is a 65 y.o. male with medical history significant of COPD, asthma, chronic pain syndrome, basal cell skin cancer, diabetes, hyperlipidemia, essential hypertension, chronic oxygen therapy on 3L at home who presented to the ER with shortness of breath, wheezing, and worsening hypoxia.  Patient's oxygen saturation were apparently dropping into the 70s on his 3 L at home.  He has been having exertional dyspnea also chronically.  He went to see his new PCP for the first time today and sats were in the 60s.  Set patient was sent to the ER for evaluation.  Patient came in acute on chronic hypoxic respiratory failure.  Given steroids breathing treatment and now he is feeling better.  He reported worsening tremors lately.  He has anxiety disorder and takes antianxiety medications.  Also chronic pain on Subutex.  He has been taking all his medicine with no loss or missing any dose.  Patient being admitted with acute on chronic respiratory failure secondary to COPD exacerbation..  ED Course: Temperature 9078 blood pressure 140/78, pulse 87 respiratory 21 oxygen sat 93% on 4 L.  White count is 9.6 hemoglobin 10.8 and platelets 198.  Chemistry largely within normal.  Respiratory panel including influenza negative.  CT angio of the chest showed no evidence of PE.  Venous Doppler ultrasound of the lower extremity also showed no DVT.  Chest x-ray showed no acute findings.  Patient being admitted for management of acute exacerbation of COPD.  Review of Systems: As per HPI otherwise 10 point review of systems negative.    Past Medical History:  Diagnosis Date   Anemia    Anxiety    Arthritis    Asthma    Cancer ([place])    Basal Cell Skin Cancer    Chronic back pain    COPD (chronic obstructive pulmonary disease) (HCC)    Depression    Diabetes mellitus (HCC)    Dyspnea    GERD (gastroesophageal reflux disease)    Gout    Gout    Headache    History of blood clots    Left Leg--July 2018   History of kidney stones    Hyperlipidemia    Hyperlipidemia    Hypertension    Kidney stones    Neuropathy    On home oxygen therapy    2 L / M   Pneumonia 06/2017   Sleep apnea    Ulcer of foot ([place])    Right    Past Surgical History:  Procedure Laterality Date   APPENDECTOMY     DG FEET 2 VIEWS BILAT     LIPOMA EXCISION Right 08/15/2017   Procedure: EXCISION TUMOR(CYST) FOOT;  Surgeon: Albertine Patricia, DPM;  Location: [facility] ORS;  Service: Podiatry;  Laterality: Right;   OTHER SURGICAL HISTORY Bilateral Foot surgery     reports that he has quit smoking. His smoking use included cigarettes. He started smoking about 4 years ago. He smoked an average of .5 packs per day. He has never used smokeless tobacco. He reports current alcohol use of about 1.0 standard drink per week. He reports that he does not use drugs.  Allergies  Allergen Reactions   Bee Venom Anaphylaxis    Family History  Problem Relation Age of  Onset   Hypertension Mother      Prior to Admission medications   Medication Sig Start Date End Date Taking? Authorizing Provider  albuterol (VENTOLIN HFA) 108 (90 Base) MCG/ACT inhaler Inhale 2 puffs into the lungs Fields 4 (four) hours as needed for wheezing or shortness of breath.   Yes [provider]  allopurinol (ZYLOPRIM) 300 MG tablet Take 600 mg by mouth daily. 02/24/20  Yes [provider]  aspirin EC 81 MG tablet Take 1 tablet by mouth daily.   Yes [provider]  buprenorphine-naloxone (SUBOXONE) 8-2 mg SUBL SL tablet Place 1 tablet under the tongue in the morning, at noon, and at bedtime.   Yes [provider]  cholecalciferol (VITAMIN D) 1000 units tablet Take 1,000 Units  by mouth daily.   Yes [provider]  clonazePAM (KLONOPIN) 1 MG tablet Take 1 tablet by mouth as needed.   Yes [provider]  clopidogrel (PLAVIX) 75 MG tablet Take 1 tablet (75 mg total) by mouth daily. 02/02/21  Yes Shawna Clamp, MD  DULoxetine (CYMBALTA) 30 MG capsule Take 1 capsule by mouth daily. 01/17/16  Yes [provider]  ezetimibe (ZETIA) 10 MG tablet Take 10 mg by mouth daily. 02/24/20  Yes [provider]  FARXIGA 10 MG TABS tablet Take 10 mg by mouth daily. 09/14/20  Yes [provider]  gabapentin (NEURONTIN) 800 MG tablet Take 800 mg by mouth 4 (four) times daily. 07/14/21  Yes [provider]  KOMBIGLYZE XR 2.04-999 MG TB24 Take 2 tablets by mouth daily. 01/17/16  Yes [provider]  metoprolol succinate (TOPROL-XL) 50 MG 24 hr tablet Take 50 mg by mouth daily. 02/24/20  Yes [provider]  pantoprazole (PROTONIX) 40 MG tablet Take 40 mg by mouth daily. 06/20/21  Yes [provider]  simvastatin (ZOCOR) 40 MG tablet Take 1 tablet by mouth daily. 01/17/16  Yes [provider]  TRELEGY ELLIPTA 100-62.5-25 MCG/INH AEPB Inhale 1 puff into the lungs daily. 12/31/19  Yes [provider]  famotidine (PEPCID) 20 MG tablet Take 1 tablet (20 mg total) by mouth 2 (two) times daily. 03/09/21 04/08/21  Val Riles, MD  gabapentin (NEURONTIN) 100 MG capsule Take 2 capsules (200 mg total) by mouth 4 (four) times daily. Patient not taking: Reported on 08/02/2021 03/09/21 08/02/21  Val Riles, MD  OXYGEN Inhale 2 L into the lungs.    [provider]    Physical Exam: Vitals:   08/02/21 1815 08/02/21 1830 08/02/21 1900 08/02/21 1923  BP:  (!) 116/94 138/77   Pulse: 80 74 87   Resp:  (!) 21 19   Temp:      TempSrc:      SpO2: 95% 99% 94% 94%  Weight:      Height:          Constitutional: Acutely ill looking in mild distress Vitals:   08/02/21 1815 08/02/21 1830 08/02/21 1900 08/02/21  1923  BP:  (!) 116/94 138/77   Pulse: 80 74 87   Resp:  (!) 21 19   Temp:      TempSrc:      SpO2: 95% 99% 94% 94%  Weight:      Height:       Eyes: PERRL, lids and conjunctivae normal ENMT: Mucous membranes are moist. Posterior pharynx clear of any exudate or lesions.Normal dentition.  Neck: normal, supple, no masses, no thyromegaly Respiratory: Decreased air entry bilaterally with marked expiratory wheezing. Normal respiratory  effort. No accessory muscle use.  Cardiovascular: Regular rate and rhythm, no murmurs / rubs / gallops. No extremity edema. 2+ pedal pulses. No carotid bruits.  Abdomen: no tenderness, no masses palpated. No hepatosplenomegaly. Bowel sounds positive.  Musculoskeletal: no clubbing / cyanosis. No joint deformity upper and lower extremities. Good ROM, no contractures. Normal muscle tone.  Skin: no rashes, lesions, ulcers. No induration Neurologic: CN 2-12 grossly intact. Sensation intact, DTR normal. Strength 5/5 in all 4.  Psychiatric: Normal judgment and insight. Alert and oriented x 3.  Anxious mood.     Labs on Admission: I have personally reviewed following labs and imaging studies  CBC: Recent Labs  Lab 08/02/21 1244  WBC 9.6  NEUTROABS 7.2  HGB 10.8*  HCT 34.0*  MCV 97.1  PLT 174   Basic Metabolic Panel: Recent Labs  Lab 08/02/21 1244  NA 137  K 4.3  CL 100  CO2 28  GLUCOSE 89  BUN 19  CREATININE 1.11  CALCIUM 8.1*   GFR: Estimated Creatinine Clearance: 63.3 mL/min (by C-G formula based on SCr of 1.11 mg/dL). Liver Function Tests: No results for input(s): AST, ALT, ALKPHOS, BILITOT, PROT, ALBUMIN in the last 168 hours. No results for input(s): LIPASE, AMYLASE in the last 168 hours. No results for input(s): AMMONIA in the last 168 hours. Coagulation Profile: No results for input(s): INR, PROTIME in the last 168 hours. Cardiac Enzymes: No results for input(s): CKTOTAL, CKMB, CKMBINDEX, TROPONINI in the last 168 hours. BNP (last 3  results) No results for input(s): PROBNP in the last 8760 hours. HbA1C: No results for input(s): HGBA1C in the last 72 hours. CBG: No results for input(s): GLUCAP in the last 168 hours. Lipid Profile: No results for input(s): CHOL, HDL, LDLCALC, TRIG, CHOLHDL, LDLDIRECT in the last 72 hours. Thyroid Function Tests: No results for input(s): TSH, T4TOTAL, FREET4, T3FREE, THYROIDAB in the last 72 hours. Anemia Panel: No results for input(s): VITAMINB12, FOLATE, FERRITIN, TIBC, IRON, RETICCTPCT in the last 72 hours. Urine analysis:    Component Value Date/Time   COLORURINE YELLOW (A) 02/27/2021 0330   APPEARANCEUR HAZY (A) 02/27/2021 0330   APPEARANCEUR Clear 03/24/2013 1853   LABSPEC 1.017 02/27/2021 0330   LABSPEC 1.020 03/24/2013 1853   PHURINE 5.0 02/27/2021 0330   GLUCOSEU 150 (A) 02/27/2021 0330   GLUCOSEU Negative 03/24/2013 1853   HGBUR LARGE (A) 02/27/2021 0330   BILIRUBINUR NEGATIVE 02/27/2021 0330   BILIRUBINUR Negative 03/24/2013 1853   KETONESUR NEGATIVE 02/27/2021 0330   PROTEINUR 30 (A) 02/27/2021 0330   NITRITE NEGATIVE 02/27/2021 0330   LEUKOCYTESUR NEGATIVE 02/27/2021 0330   LEUKOCYTESUR Negative 03/24/2013 1853   Sepsis Labs: @LABRCNTIP (procalcitonin:4,lacticidven:4) ) Recent Results (from the past 240 hour(s))  Resp Panel by RT-PCR (Flu A&B, Covid) Nasopharyngeal Swab     Status: None   Collection Time: 08/02/21  4:17 PM   Specimen: Nasopharyngeal Swab; Nasopharyngeal(NP) swabs in vial transport medium  Result Value Ref Range Status   SARS Coronavirus 2 by RT PCR NEGATIVE NEGATIVE Final    Comment: (NOTE) SARS-CoV-2 target nucleic acids are NOT DETECTED.  The SARS-CoV-2 RNA is generally detectable in upper respiratory specimens during the acute phase of infection. The lowest concentration of SARS-CoV-2 viral copies this assay can detect is 138 copies/mL. A negative result does not preclude SARS-Cov-2 infection and should not be used as the sole basis  for treatment or other patient management decisions. A negative result may occur with  improper specimen collection/handling, submission of specimen other than  nasopharyngeal swab, presence of viral mutation(s) within the areas targeted by this assay, and inadequate number of viral copies(<138 copies/mL). A negative result must be combined with clinical observations, patient history, and epidemiological information. The expected result is Negative.  Fact Sheet for Patients:  EntrepreneurPulse.com.au  Fact Sheet for Healthcare Providers:  IncredibleEmployment.be  This test is no t yet approved or cleared by the [place] FDA and  has been authorized for detection and/or diagnosis of SARS-CoV-2 by FDA under an Emergency Use Authorization (EUA). This EUA will remain  in effect (meaning this test can be used) for the duration of the COVID-19 declaration under Section 564(b)(1) of the Act, 21 U.S.C.section 360bbb-3(b)(1), unless the authorization is terminated  or revoked sooner.       Influenza A by PCR NEGATIVE NEGATIVE Final   Influenza B by PCR NEGATIVE NEGATIVE Final    Comment: (NOTE) The Xpert Xpress SARS-CoV-2/FLU/RSV plus assay is intended as an aid in the diagnosis of influenza from Nasopharyngeal swab specimens and should not be used as a sole basis for treatment. Nasal washings and aspirates are unacceptable for Xpert Xpress SARS-CoV-2/FLU/RSV testing.  Fact Sheet for Patients: EntrepreneurPulse.com.au  Fact Sheet for Healthcare Providers: IncredibleEmployment.be  This test is not yet approved or cleared by the [place] FDA and has been authorized for detection and/or diagnosis of SARS-CoV-2 by FDA under an Emergency Use Authorization (EUA). This EUA will remain in effect (meaning this test can be used) for the duration of the COVID-19 declaration under Section 564(b)(1) of the Act, 21  U.S.C. section 360bbb-3(b)(1), unless the authorization is terminated or revoked.  Performed at [facility], [address], [place], [place] [address]      Radiological Exams on Admission: DG Chest 2 View  Result Date: 08/02/2021 CLINICAL DATA:  Hypoxia. EXAM: CHEST - 2 VIEW COMPARISON:  Chest x-ray dated March 02, 2021. FINDINGS: The heart size and mediastinal contours are within normal limits. Similar emphysematous changes with coarsened interstitial markings. New opacity in the peripheral left upper lobe at the site of previously treated lung cancer. No pleural effusion or pneumothorax. No acute osseous abnormality. IMPRESSION: 1. New opacity in the peripheral left upper lobe at the site of previously treated lung cancer. This could reflect recurrence, post treatment change, or pneumonia. 2. COPD. Electronically Signed   By: Titus Dubin M.D.   On: 08/02/2021 13:31   CT Angio Chest PE W and/or Wo Contrast  Result Date: 08/02/2021 CLINICAL DATA:  Pulmonary embolus suspected with high probability. Low oxygen saturation. Chronic oxygen use. EXAM: CT ANGIOGRAPHY CHEST WITH CONTRAST TECHNIQUE: Multidetector CT imaging of the chest was performed using the standard protocol during bolus administration of intravenous contrast. Multiplanar CT image reconstructions and MIPs were obtained to evaluate the vascular anatomy. CONTRAST:  10mL OMNIPAQUE IOHEXOL 350 MG/ML SOLN COMPARISON:  Chest radiograph 08/02/2021.  CT chest 02/21/2021 FINDINGS: Cardiovascular: Good opacification of the central and segmental pulmonary arteries. No focal filling defects. No evidence of significant pulmonary embolus. Cardiac enlargement. No pericardial effusions. Normal caliber thoracic aorta. No aortic dissection. Great vessel origins are patent. Coronary artery and aortic calcifications. Mediastinum/Nodes: Bilateral hilar and mediastinal lymphadenopathy. Largest lymph node is in the right hilar region, measuring  2.7 cm short axis dimension. This is new since the previous CT. Thyroid gland is unremarkable. Esophagus is decompressed. Lungs/Pleura: Diffuse emphysematous changes in the lungs. There is an area of consolidation in the posteroinferior left upper lung corresponding to lesion seen at chest radiograph. A  prior CT scan demonstrated a mass in this area. The area of abnormality has enlarged significantly since that time with probable central mass lesion measuring 2.6 cm in diameter with surrounding consolidation. Combined with the new lymphadenopathy, this could represent recurrent or metastatic tumor with surrounding infiltration. Pneumonia would be a less likely consideration. No pleural effusions. No pneumothorax. Patchy infiltration or atelectasis in the lung bases. Upper Abdomen: No acute abnormalities demonstrated in the visualized upper abdomen. Musculoskeletal: Multiple old bilateral rib fractures. Focal area sclerosis in the right lateral fourth rib is likely a healed fracture. Sclerotic metastasis would be a less likely consideration. Old mid sternal fracture deformity. Degenerative changes in the spine. Review of the MIP images confirms the above findings. IMPRESSION: 1. No evidence of significant pulmonary embolus. 2. Mass with surrounding consolidation in the left upper lung corresponding to chest radiographic abnormality. This is enlarging since previous CT and suggests recurrent or metastatic neoplasm. Pneumonia possible but felt less likely. 3. Enlarging bilateral hilar and mediastinal lymphadenopathy, possibly metastatic. 4. Multiple old rib and sternal fractures. 5. Aortic atherosclerosis and emphysema. Electronically Signed   By: Lucienne Capers M.D.   On: 08/02/2021 17:18   US Venous Img Lower Unilateral Left  Result Date: 08/02/2021 CLINICAL DATA:  leg swelling eval for dvt EXAM: LEFT LOWER EXTREMITY VENOUS DOPPLER ULTRASOUND TECHNIQUE: Gray-scale sonography with compression, as well as color  and duplex ultrasound, were performed to evaluate the deep venous system(s) from the level of the common femoral vein through the popliteal and proximal calf veins. COMPARISON:  None. FINDINGS: VENOUS Normal compressibility of the common femoral, superficial femoral, and popliteal veins, as well as the visualized calf veins. Visualized portions of profunda femoral vein and great saphenous vein unremarkable. No filling defects to suggest DVT on grayscale or color Doppler imaging. Doppler waveforms show normal direction of venous flow, normal respiratory plasticity and response to augmentation. Limited views of the contralateral common femoral vein are unremarkable. IMPRESSION: No evidence of DVT in the left lower extremity. Electronically Signed   By: Margaretha Sheffield M.D.   On: 08/02/2021 17:38      Assessment/Plan Principal Problem:   COPD exacerbation (HCC) Active Problems:   Hypertension   Hyperlipidemia   Tobacco use disorder   Sleep apnea   Type 2 diabetes mellitus with foot ulcer (CODE) (HCC)   Polysubstance (excluding opioids) dependence ([place])     #1 acute on chronic respiratory failure with hypoxia: Secondary to COPD exacerbation.  Patient will be admitted.  IV steroid, nebulizer treatment, doxycycline as well as supportive care.  Continue oxygen and titrate to assist home dose.  #2 acute exacerbation of COPD: As per above.  #3 essential hypertension: Continue blood pressure control.  #4 hyperlipidemia: Continue with statin  #5 diabetes: Blood sugar control likely to worsen.  Sliding scale insulin.  #5 obstructive sleep apnea: CPAP at night as needed.  #6 chronic pain syndrome: Resume home Subutex at home dose.  Continue to monitor  #7 hyperlipidemia: Continue statin   DVT prophylaxis: Lovenox Code Status: Full code Family Communication: No family at bedside Disposition Plan: Home Consults called: None Admission status: Observation  Severity of Illness: The  appropriate patient status for this patient is OBSERVATION. Observation status is judged to be reasonable and necessary in order to provide the required intensity of service to ensure the patient's safety. The patient's presenting symptoms, physical exam findings, and initial radiographic and laboratory data in the context of their medical condition is felt to place them  at decreased risk for further clinical deterioration. Furthermore, it is anticipated that the patient will be medically stable for discharge from the hospital within 2 midnights of admission. The following factors support the patient status of observation.   " The patient's presenting symptoms include shortness of breath. " The physical exam findings include expiratory wheezing. " The initial radiographic and laboratory data are within normal.   Rayan Dyal,LAWAL MD [facility] Pager [phone]  If 7PM-7AM, please contact night-coverage [email or web address] Password [facility]  08/02/2021, 8:46 PM

## 2021-08-02 NOTE — ED Provider Notes (Signed)
[facility] Emergency Department Provider Note    Event Date/Time   First MD Initiated Contact with Patient 08/02/21 1504     (approximate)  I have reviewed the triage vital signs and the nursing notes.   HISTORY  Chief Complaint Shortness of Breath    HPI Christian Fields is a 65 y.o. male extensive past medical history as listed below on 3 L nasal cannula chronically presents to the ER for evaluation of hypoxia.  States with past few days has been having increasing exertional dyspnea with his O2 sats dropping into the 70s on his home oxygen.  States he was being seen for a new PCP encounter today and was found to be hypoxic to the 60s.  He is uncertain as to whether his air concentration device has been working but thinks that it is.  No recent antibiotics.  Has noted some swelling in his left lower extremity.  Does have mild history of lung cancer status post radiation not currently on a chemotherapy not any on blood thinners.  Past Medical History:  Diagnosis Date   Anemia    Anxiety    Arthritis    Asthma    Cancer ([place])    Basal Cell Skin Cancer   Chronic back pain    COPD (chronic obstructive pulmonary disease) (HCC)    Depression    Diabetes mellitus (HCC)    Dyspnea    GERD (gastroesophageal reflux disease)    Gout    Gout    Headache    History of blood clots    Left Leg--July 2018   History of kidney stones    Hyperlipidemia    Hyperlipidemia    Hypertension    Kidney stones    Neuropathy    On home oxygen therapy    2 L / M   Pneumonia 06/2017   Sleep apnea    Ulcer of foot ([place])    Right   Family History  Problem Relation Age of Onset   Hypertension Mother    Past Surgical History:  Procedure Laterality Date   APPENDECTOMY     DG FEET 2 VIEWS BILAT     LIPOMA EXCISION Right 08/15/2017   Procedure: EXCISION TUMOR(CYST) FOOT;  Surgeon: Albertine Patricia, DPM;  Location: [facility] ORS;  Service: Podiatry;  Laterality: Right;    OTHER SURGICAL HISTORY Bilateral Foot surgery   Patient Active Problem List   Diagnosis Date Noted   Acute on chronic respiratory failure with hypoxia ([place]) 03/08/2021   Septic shock ([place]) 02/26/2021   Polysubstance (excluding opioids) dependence ([place]) 79/89/2119   Acute metabolic encephalopathy 41/74/0814   COPD with acute exacerbation ([place]) 01/29/2021   Iron deficiency anemia 11/27/2020   Severe sepsis (HCC)    NSCLC of left lung ([place]) 11/12/2020   Left leg swelling 04/28/2020   Confusion    Acute respiratory failure with hypoxia ([place]) 03/26/2020   Pneumonia of both lower lobes due to infectious organism 03/25/2020   AKI (acute kidney injury) ([place]) 01/13/2020   Cellulitis of left leg 01/12/2020   COVID-19 virus detected 01/12/2020   Diabetic ulcer of left midfoot associated with type 2 diabetes mellitus, with fat layer exposed ([place]) 01/12/2020   Panlobular emphysema ([place]) 01/12/2020   Arthritis 10/29/2019   Hemorrhoids 10/29/2019   Senile nuclear sclerosis, bilateral 02/15/2019   Cellulitis of left upper limb 02/11/2019   AAA (abdominal aortic aneurysm) without rupture ([place]) 01/15/2019   Localized swelling, mass and lump, upper limb 12/24/2018  Pain in femur 12/24/2018   TIA (transient ischemic attack) 11/18/2018   Cortical age-related cataract of both eyes 11/02/2018   Fracture of multiple ribs 04/20/2018   Overweight (BMI 25.0-29.9) 04/20/2018   Hypertension 07/08/2017   Diabetes mellitus ([place]) 07/08/2017   Hyperlipidemia 07/08/2017   Tobacco use disorder 07/08/2017   Atherosclerosis of native arteries of extremity with intermittent claudication ([place]) 07/08/2017   SOB (shortness of breath) 06/19/2017   Acute on chronic respiratory failure with hypoxia and hypercapnia ([place]) 12/15/2016   CAP (community acquired pneumonia) 12/15/2016   Chronic pain 12/15/2016   Chronically on opiate therapy 12/15/2016   History of kidney stones 12/15/2016   Polypharmacy 12/15/2016    Somnolence 12/15/2016   Foot ulcer ([place]) 01/19/2016   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 Authorizing Provider  albuterol (VENTOLIN HFA) 108 (90 Base) MCG/ACT inhaler Inhale 2 puffs into the lungs every 4 (four) hours as needed for wheezing or shortness of breath.    [provider]  allopurinol (ZYLOPRIM) 300 MG tablet Take 600 mg by mouth 2 (two) times daily. 02/24/20   [provider]  aspirin EC 81 MG tablet Take 1 tablet by mouth daily.    [provider]  cholecalciferol (VITAMIN D) 1000 units tablet Take 1,000 Units by mouth daily.    [provider]  clopidogrel (PLAVIX) 75 MG tablet Take 1 tablet (75 mg total) by mouth daily. 02/02/21   Shawna Clamp, MD  DULoxetine (CYMBALTA) 30 MG capsule Take 1 capsule by mouth daily. 01/17/16   [provider]  ezetimibe (ZETIA) 10 MG tablet Take 10 mg by mouth daily. 02/24/20   [provider]  famotidine (PEPCID) 20 MG tablet Take 1 tablet (20 mg total) by mouth 2 (two) times daily. 03/09/21 04/08/21  Val Riles, MD  FARXIGA 10 MG TABS tablet Take 10 mg by mouth daily. 09/14/20   [provider]  ferrous sulfate 325 (65 FE) MG tablet Take 325 mg by mouth daily with breakfast. Patient not taking: No sig reported    [provider]  gabapentin (NEURONTIN) 100 MG capsule Take 2 capsules (200 mg total) by mouth 4 (four) times  daily. Patient taking differently: Take 800 mg by mouth 4 (four) times daily. 03/09/21 04/08/21  Val Riles, MD  KOMBIGLYZE XR 2.04-999 MG TB24 Take 2 tablets by mouth daily. 01/17/16   [provider]  metoprolol succinate (TOPROL-XL) 50 MG 24 hr tablet Take 50 mg by mouth daily. 02/24/20   [provider]  OXYGEN Inhale 2 L into the lungs.    [provider]  simvastatin (ZOCOR) 40 MG tablet Take 1 tablet by mouth daily. 01/17/16   [provider]  TRELEGY ELLIPTA 100-62.5-25 MCG/INH AEPB Inhale 1 puff into the lungs daily. 12/31/19   [provider]    Allergies Bee venom    Social History Social History   Tobacco Use   Smoking status: Former    Packs/day: 0.50  Types: Cigarettes    Start date: 2018   Smokeless tobacco: Never  Vaping Use   Vaping Use: Never used  Substance Use Topics   Alcohol use: Yes    Alcohol/week: 1.0 standard drink    Types: 1 Cans of beer per week    Comment: occ   Drug use: No    Review of Systems Patient denies headaches, rhinorrhea, blurry vision, numbness, shortness of breath, chest pain, edema, cough, abdominal pain, nausea, vomiting, diarrhea, dysuria, fevers, rashes or hallucinations unless otherwise stated above in HPI. ____________________________________________   PHYSICAL EXAM:  VITAL SIGNS: Vitals:   08/02/21 1815 08/02/21 1830  BP:  (!) 116/94  Pulse: 80 74  Resp:  (!) 21  Temp:    SpO2: 95% 99%    Constitutional: Alert and oriented.  Eyes: Conjunctivae are normal.  Head: Atraumatic. Nose: No congestion/rhinnorhea. Mouth/Throat: Mucous membranes are moist.   Neck: No stridor. Painless ROM.  Cardiovascular: Normal rate, regular rhythm. Grossly normal heart sounds.  Good peripheral circulation. Respiratory: Normal respiratory effort.  No retractions. Lungs with diminshed BS throughout Gastrointestinal: Soft and nontender. No distention. No abdominal bruits. No CVA  tenderness. Genitourinary:  Musculoskeletal: No lower extremity tenderness nor edema.  No joint effusions. Neurologic:  Normal speech and language. No gross focal neurologic deficits are appreciated. No facial droop Skin:  Skin is warm, dry and intact. No rash noted. Psychiatric: Mood and affect are normal. Speech and behavior are normal.  ____________________________________________   LABS (all labs ordered are listed, but only abnormal results are displayed)  Results for orders placed or performed during the hospital encounter of 08/02/21 (from the past 24 hour(s))  Basic metabolic panel     Status: Abnormal   Collection Time: 08/02/21 12:44 PM  Result Value Ref Range   Sodium 137 135 - 145 mmol/L   Potassium 4.3 3.5 - 5.1 mmol/L   Chloride 100 98 - 111 mmol/L   CO2 28 22 - 32 mmol/L   Glucose, Bld 89 70 - 99 mg/dL   BUN 19 8 - 23 mg/dL   Creatinine, Ser 1.11 0.61 - 1.24 mg/dL   Calcium 8.1 (L) 8.9 - 10.3 mg/dL   GFR, Estimated >60 >60 mL/min   Anion gap 9 5 - 15  CBC with Differential     Status: Abnormal   Collection Time: 08/02/21 12:44 PM  Result Value Ref Range   WBC 9.6 4.0 - 10.5 K/uL   RBC 3.50 (L) 4.22 - 5.81 MIL/uL   Hemoglobin 10.8 (L) 13.0 - 17.0 g/dL   HCT 34.0 (L) 39.0 - 52.0 %   MCV 97.1 80.0 - 100.0 fL   MCH 30.9 26.0 - 34.0 pg   MCHC 31.8 30.0 - 36.0 g/dL   RDW 14.7 11.5 - 15.5 %   Platelets 198 150 - 400 K/uL   nRBC 0.0 0.0 - 0.2 %   Neutrophils Relative % 73 %   Neutro Abs 7.2 1.7 - 7.7 K/uL   Lymphocytes Relative 17 %   Lymphs Abs 1.6 0.7 - 4.0 K/uL   Monocytes Relative 5 %   Monocytes Absolute 0.4 0.1 - 1.0 K/uL   Eosinophils Relative 3 %   Eosinophils Absolute 0.3 0.0 - 0.5 K/uL   Basophils Relative 1 %   Basophils Absolute 0.1 0.0 - 0.1 K/uL   Immature Granulocytes 1 %   Abs Immature Granulocytes 0.06 0.00 - 0.07 K/uL  Resp Panel by RT-PCR (Flu A&B, Covid) Nasopharyngeal Swab  Status: None   Collection Time: 08/02/21  4:17 PM    Specimen: Nasopharyngeal Swab; Nasopharyngeal(NP) swabs in vial transport medium  Result Value Ref Range   SARS Coronavirus 2 by RT PCR NEGATIVE NEGATIVE   Influenza A by PCR NEGATIVE NEGATIVE   Influenza B by PCR NEGATIVE NEGATIVE   ____________________________________________ ____________________________________________  RADIOLOGY  I personally reviewed all radiographic images ordered to evaluate for the above acute complaints and reviewed radiology reports and findings.  These findings were personally discussed with the patient.  Please see medical record for radiology report.  ____________________________________________   PROCEDURES  Procedure(s) performed:  .Critical Care  Date/Time: 08/02/2021 7:09 PM Performed by: Merlyn Lot, MD Authorized by: Merlyn Lot, MD   Critical care provider statement:    Critical care time (minutes):  35   Critical care time was exclusive of:  Separately billable procedures and treating other patients   Critical care was necessary to treat or prevent imminent or life-threatening deterioration of the following conditions:  Respiratory failure   Critical care was time spent personally by me on the following activities:  Development of treatment plan with patient or surrogate, discussions with consultants, evaluation of patient's response to treatment, examination of patient, obtaining history from patient or surrogate, ordering and performing treatments and interventions, ordering and review of laboratory studies, ordering and review of radiographic studies, pulse oximetry, re-evaluation of patient's condition and review of old charts   Critical Care performed: yes ____________________________________________   INITIAL IMPRESSION / [place] / ED COURSE  Pertinent labs & imaging results that were available during my care of the patient were reviewed by me and considered in my medical decision making (see chart for  details).   DDX: Asthma, copd, CHF, pna, ptx, malignancy, Pe, anemia   Christian Fields is a 65 y.o. who presents to the ED with presentation as described above.  Patient chronically ill-appearing reportedly having significant hypoxic events which prompted him come to the ER having worsening exertional dyspnea the past several weeks.  Given his history and chest x-ray showing evidence of increasing nodularity and possible mass versus pneumonia CTA was ordered to evaluate for PE.  Clinical Course as of 08/02/21 1912  Thu Aug 02, 2021  1910 Patient is COVID-negative.  CTA without any evidence of PE but does show evidence of increasing size of mass.  Patient with ambulation trial had significant desaturation down to 70% and when returned in bed was still persistently hypoxic in the mid 80s requiring increase in his O2 to 4-1/2 L which brought it up in the low 90s.  This is new for him therefore will give additional nebulizer as well as Solu-Medrol.  Will discuss with hospitalist for admission. [PR]    Clinical Course User Index [PR] Merlyn Lot, MD    The patient was evaluated in Emergency Department today for the symptoms described in the history of present illness. He/she was evaluated in the context of the global COVID-19 pandemic, which necessitated consideration that the patient might be at risk for infection with the SARS-CoV-2 virus that causes COVID-19. Institutional protocols and algorithms that pertain to the evaluation of patients at risk for COVID-19 are in a state of rapid change based on information released by regulatory bodies including the CDC and federal and state organizations. These policies and algorithms were followed during the patient's care in the ED.  As part of my medical decision making, I reviewed the following data within the [facility]  notes reviewed and incorporated, Labs reviewed, notes from prior ED visits and [place] Controlled Substance  Database   ____________________________________________   FINAL CLINICAL IMPRESSION(S) / ED DIAGNOSES  Final diagnoses:  Acute on chronic respiratory failure with hypoxia ([place])      NEW MEDICATIONS STARTED DURING THIS VISIT:  New Prescriptions   No medications on file     Note:  This document was prepared using Dragon voice recognition software and may include unintentional dictation errors.    Merlyn Lot, MD 08/02/21 [phone]

## 2021-08-02 NOTE — ED Triage Notes (Addendum)
Pt to ER via [facility] from [facility]. Ems were called out due to a low oxygen saturation reading 53% on arrival to facility. EMS report a reading of 95%. Pt chronically on 3L oxygen via [place]. Currently 95% on 3L. Denies any complaints. Ems report mild wheezing in lower lobes.   EMS VS- 89 cbg, 82HR, 142/74, 95% on 3L, 98.1 oral temp   Pt oxygen tank switched to ER oxygen tank, placed on 3L. Sats 93%.

## 2021-08-02 NOTE — ED Notes (Signed)
Pt. Was ambulated by this tech. Pulse ox. read as low as 75%.

## 2021-08-03 DIAGNOSIS — J441 Chronic obstructive pulmonary disease with (acute) exacerbation: Secondary | ICD-10-CM | POA: Diagnosis not present

## 2021-08-03 LAB — CBC
HCT: 32.6 % — ABNORMAL LOW (ref 39.0–52.0)
Hemoglobin: 10.8 g/dL — ABNORMAL LOW (ref 13.0–17.0)
MCH: 31.9 pg (ref 26.0–34.0)
MCHC: 33.1 g/dL (ref 30.0–36.0)
MCV: 96.2 fL (ref 80.0–100.0)
Platelets: 200 10*3/uL (ref 150–400)
RBC: 3.39 MIL/uL — ABNORMAL LOW (ref 4.22–5.81)
RDW: 14.6 % (ref 11.5–15.5)
WBC: 3.8 10*3/uL — ABNORMAL LOW (ref 4.0–10.5)
nRBC: 0 % (ref 0.0–0.2)

## 2021-08-03 LAB — GLUCOSE, CAPILLARY
Glucose-Capillary: 154 mg/dL — ABNORMAL HIGH (ref 70–99)
Glucose-Capillary: 190 mg/dL — ABNORMAL HIGH (ref 70–99)
Glucose-Capillary: 193 mg/dL — ABNORMAL HIGH (ref 70–99)
Glucose-Capillary: 323 mg/dL — ABNORMAL HIGH (ref 70–99)

## 2021-08-03 LAB — COMPREHENSIVE METABOLIC PANEL
ALT: 9 U/L (ref 0–44)
AST: 14 U/L — ABNORMAL LOW (ref 15–41)
Albumin: 3.2 g/dL — ABNORMAL LOW (ref 3.5–5.0)
Alkaline Phosphatase: 104 U/L (ref 38–126)
Anion gap: 8 (ref 5–15)
BUN: 19 mg/dL (ref 8–23)
CO2: 30 mmol/L (ref 22–32)
Calcium: 8.1 mg/dL — ABNORMAL LOW (ref 8.9–10.3)
Chloride: 101 mmol/L (ref 98–111)
Creatinine, Ser: 1.02 mg/dL (ref 0.61–1.24)
GFR, Estimated: >60 mL/min (ref >60)
Glucose, Bld: 164 mg/dL — ABNORMAL HIGH (ref 70–99)
Potassium: 4.1 mmol/L (ref 3.5–5.1)
Sodium: 139 mmol/L (ref 135–145)
Total Bilirubin: 0.8 mg/dL (ref 0.3–1.2)
Total Protein: 7.1 g/dL (ref 6.5–8.1)

## 2021-08-03 LAB — HIV ANTIBODY (ROUTINE TESTING W REFLEX): HIV Screen 4th Generation wRfx: NONREACTIVE

## 2021-08-03 LAB — HEMOGLOBIN A1C
Hgb A1c MFr Bld: 5.6 % (ref 4.8–5.6)
Mean Plasma Glucose: 114.02 mg/dL

## 2021-08-03 MED ORDER — IPRATROPIUM BROMIDE 0.02 % IN SOLN
0.5000 mg | Freq: Four times a day (QID) | RESPIRATORY_TRACT | Status: DC | PRN
  Filled 2021-08-03: qty 2.5

## 2021-08-03 MED ORDER — ADULT MULTIVITAMIN W/MINERALS CH
1.0000 | ORAL_TABLET | Freq: Every day | ORAL | Status: DC
  Administered 2021-08-03: 1 via ORAL
  Filled 2021-08-03: qty 1

## 2021-08-03 MED ORDER — UMECLIDINIUM BROMIDE 62.5 MCG/INH IN AEPB
1.0000 | INHALATION_SPRAY | Freq: Every day | RESPIRATORY_TRACT | Status: DC
  Administered 2021-08-03 – 2021-08-04 (×2): 1 via RESPIRATORY_TRACT
  Filled 2021-08-03: qty 7

## 2021-08-03 MED ORDER — METFORMIN HCL ER 500 MG PO TB24
2000.0000 mg | ORAL_TABLET | Freq: Every day | ORAL | Status: DC
  Filled 2021-08-03: qty 4

## 2021-08-03 MED ORDER — FLUTICASONE FUROATE-VILANTEROL 100-25 MCG/INH IN AEPB
1.0000 | INHALATION_SPRAY | Freq: Every day | RESPIRATORY_TRACT | Status: DC
  Administered 2021-08-03 – 2021-08-04 (×2): 1 via RESPIRATORY_TRACT
  Filled 2021-08-03: qty 28

## 2021-08-03 MED ORDER — LINAGLIPTIN 5 MG PO TABS
5.0000 mg | ORAL_TABLET | Freq: Every day | ORAL | Status: DC
  Administered 2021-08-03 – 2021-08-04 (×2): 5 mg via ORAL
  Filled 2021-08-03 (×3): qty 1

## 2021-08-03 NOTE — Evaluation (Signed)
Occupational Therapy Evaluation Patient Details Name: Christian Fields MRN: [id number] DOB: 1956-05-18 Today's Date: 08/03/2021    History of Present Illness 65 yo male with onset of SOB and wheezing, hypoxia and increased tremors was admitted.  Has been desaturating a great deal recently, having chronic pain, requiring more than baseline O2.  Cleared for PE and DVT, has lung masses per chart.  PMHx:  asthma, COPD, basal cell skin CA, DM, HLD, HTN, home O2 use, gout, HA, neuropathy, PNA, sleep apnea   Clinical Impression   Christian Fields presents with generalized weakness, SOB, and limited endurance. He is able to perform bed mobility, transfers, LB dressing, toileting, grooming, w/in room ambulation, with Mod I - CGA. Reports he feels back to baseline fxl mobility, although with increased SOB. Daughter is in the process of arranging for in-home assistance with cooking, cleaning, housework. Pt able to perform ADL INDly at home. No additional OT required at this time. Pt already has at home all DME presently needed.    Follow Up Recommendations  No OT follow up    Equipment Recommendations  None recommended by OT    Recommendations for Other Services       Precautions / Restrictions Precautions Precautions: Fall Precaution Comments: monitor O2 sats Restrictions Weight Bearing Restrictions: No      Mobility Bed Mobility Overal bed mobility: Modified Independent             General bed mobility comments: extra time and uses rail    Transfers Overall transfer level: Needs assistance Equipment used: Rolling walker (2 wheeled);1 person hand held assist Transfers: Sit to/from Stand Sit to Stand: Supervision         General transfer comment: O2 sats drop from mid 90s into upper 80s with sit-to-stand transfer, recovers w/in 1 minute    Balance Overall balance assessment: Needs assistance Sitting-balance support: Feet supported Sitting balance-Leahy Scale: Good     Standing  balance support: No upper extremity supported Standing balance-Leahy Scale: Fair                             ADL either performed or assessed with clinical judgement   ADL Overall ADL's : Needs assistance/impaired Eating/Feeding: Independent   Grooming: Wash/dry hands;Supervision/safety               Lower Body Dressing: Modified independent Lower Body Dressing Details (indicate cue type and reason): donning/doffing socks Toilet Transfer: Supervision/safety   Toileting- Clothing Manipulation and Hygiene: Modified independent       Functional mobility during ADLs: Min guard General ADL Comments: small, shuffling steps from bed to/from toilet w/o AD, minor LOB     Vision         Perception     Praxis      Pertinent Vitals/Pain Pain Assessment: No/denies pain     Hand Dominance Right   Extremity/Trunk Assessment Upper Extremity Assessment Upper Extremity Assessment: Overall WFL for tasks assessed   Lower Extremity Assessment Lower Extremity Assessment: Overall WFL for tasks assessed   Cervical / Trunk Assessment Cervical / Trunk Assessment: Kyphotic   Communication Communication Communication: No difficulties   Cognition Arousal/Alertness: Awake/alert Behavior During Therapy: WFL for tasks assessed/performed Overall Cognitive Status: Within Functional Limits for tasks assessed                                 General Comments:  monitors his sats and aware of the drops, down to 40% range at admission   General Comments  multiple small wounds on toes, feet, legs    Exercises Exercises: Other exercises (4+ general strength on LE's) Other Exercises Other Exercises: Bed mobility, transfers, w/in ambulation, toileting, grooming, dressing   Shoulder Instructions      Home Living Family/patient expects to be discharged to:: Private residence Living Arrangements: Alone Available Help at Discharge: Family;Available  PRN/intermittently Type of Home: Mobile home Home Access: Stairs to enter Entrance Stairs-Number of Steps: 5 Entrance Stairs-Rails: Right;Left Home Layout: One level     Bathroom Shower/Tub: Teacher, early years/pre: Standard     Home Equipment: Environmental consultant - 2 wheels;Shower seat;Cane - single point;Grab bars - tub/shower;Grab bars - toilet   Additional Comments: using RW sporadically      Prior Functioning/Environment Level of Independence: Independent;Independent with assistive device(s)        Comments: has been able to walk independently but at times needs RW        OT Problem List: Decreased strength;Impaired balance (sitting and/or standing);Decreased activity tolerance;Decreased coordination      OT Treatment/Interventions:      OT Goals(Current goals can be found in the care plan section) Acute Rehab OT Goals Patient Stated Goal: to get home OT Goal Formulation: With patient Time For Goal Achievement: 08/17/21 Potential to Achieve Goals: Good  OT Frequency:     Barriers to D/C:            Co-evaluation              AM-PAC OT "6 Clicks" Daily Activity     Outcome Measure Help from another person eating meals?: None Help from another person taking care of personal grooming?: None Help from another person toileting, which includes using toliet, bedpan, or urinal?: A Little Help from another person bathing (including washing, rinsing, drying)?: A Little Help from another person to put on and taking off regular upper body clothing?: None Help from another person to put on and taking off regular lower body clothing?: None 6 Click Score: 22   End of Session    Activity Tolerance: Patient tolerated treatment well Patient left: in bed;with call bell/phone within reach  OT Visit Diagnosis: Unsteadiness on feet (R26.81);Repeated falls (R29.6);Muscle weakness (generalized) (M62.81)                Time: 3267-1245 OT Time Calculation (min): 24  min Charges:  OT General Charges $OT Visit: 1 Visit OT Evaluation $OT Eval Low Complexity: 1 Low OT Treatments $Self Care/Home Management : 23-37 mins Josiah Lobo, PhD, MS, OTR/L 08/03/21, 2:08 PM

## 2021-08-03 NOTE — TOC Initial Note (Addendum)
Transition of Care [facility]) - Initial/Assessment Note    Patient Details  Name: Christian Fields MRN: [id number] Date of Birth: [birth year]  Transition of Care [facility]) CM/SW Contact:    Magnus Ivan, LCSW Phone Number: 08/03/2021, 9:39 AM  Clinical Narrative:         CSW completed high risk screening with patient. Patient lives alone. Sometimes he drives himself to appointments, nephew provides transport if needed. PCP was [facility] but recently switched, could not recall name of new PCP. Pharmacy is [facility] in [place]. Patient has a RW, cane, rollator, and oxygen at home. Patient stated his daughter is working on getting a Equities trader up for him for help at home. Per chart review, patient had [facility] in the past. Patient denies SNF history. Asked Thedore Mins with [employer] if patient has a [facility] through them as there are notes from earlier this year about setting one up.         2:38- Spoke to patient as well as daughter Drue Dun regarding SNF recommendation. Patient refuses SNF. Patient and daughter are agreeable to [facility]. Referral made to [place] with [facility] for PT, OT, RN, [place].     Expected Discharge Plan: Home/Self Care Barriers to Discharge: Continued Medical Work up   Patient Goals and CMS Choice Patient states their goals for this hospitalization and ongoing recovery are:: to return home CMS Medicare.gov Compare Post Acute Care list provided to:: Patient Choice offered to / list presented to : Patient  Expected Discharge Plan and Services Expected Discharge Plan: Home/Self Care       Living arrangements for the past 2 months: Single Family Home                                      Prior Living Arrangements/Services Living arrangements for the past 2 months: Single Family Home Lives with:: Self Patient language and need for interpreter reviewed:: Yes Do you feel safe going back to the place where you live?: Yes      Need for Family Participation in Patient  Care: Yes (Comment) Care giver support system in place?: Yes (comment) Current home services: DME Criminal Activity/Legal Involvement Pertinent to Current Situation/Hospitalization: No - Comment as needed  Activities of Daily Living Home Assistive Devices/Equipment: None ADL Screening (condition at time of admission) Patient's cognitive ability adequate to safely complete daily activities?: Yes Is the patient deaf or have difficulty hearing?: No Does the patient have difficulty seeing, even when wearing glasses/contacts?: No Does the patient have difficulty concentrating, remembering, or making decisions?: No Patient able to express need for assistance with ADLs?: Yes Does the patient have difficulty dressing or bathing?: No Independently performs ADLs?: Yes (appropriate for developmental age) Does the patient have difficulty walking or climbing stairs?: No Weakness of Legs: Both Weakness of Arms/Hands: None  Permission Sought/Granted Permission sought to share information with : Chartered certified accountant granted to share information with : Yes, Verbal Permission Granted     Permission granted to share info w AGENCY: [place], DME agencies if needed        Emotional Assessment       Orientation: : Oriented to Self, Oriented to Place, Oriented to  Time, Oriented to Situation Alcohol / Substance Use: Not Applicable Psych Involvement: No (comment)  Admission diagnosis:  COPD exacerbation ([place]) [J44.1] Acute on chronic respiratory failure with hypoxia ([place]) [J96.21] Patient Active Problem List  Diagnosis Date Noted   COPD exacerbation ([place]) 08/02/2021   Acute on chronic respiratory failure with hypoxia (HCC) 03/08/2021   Septic shock ([place]) 02/26/2021   Polysubstance (excluding opioids) dependence ([place]) 16/09/9603   Acute metabolic encephalopathy 54/08/8118   COPD with acute exacerbation ([place]) 01/29/2021   Iron deficiency anemia 11/27/2020   Severe sepsis (HCC)     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   Left leg swelling 04/28/2020   Confusion    Acute respiratory failure with hypoxia ([place]) 03/26/2020   Pneumonia of both lower lobes due to infectious organism 03/25/2020   AKI (acute kidney injury) ([place]) 01/13/2020   Cellulitis of left leg 01/12/2020   COVID-19 virus detected 01/12/2020   Diabetic ulcer of left midfoot associated with type 2 diabetes mellitus, with fat layer exposed ([place]) 01/12/2020   Panlobular emphysema ([place]) 01/12/2020   Arthritis 10/29/2019   Hemorrhoids 10/29/2019   Senile nuclear sclerosis, bilateral 02/15/2019   Cellulitis of left upper limb 02/11/2019   AAA (abdominal aortic aneurysm) without rupture (HCC) 01/15/2019   Localized swelling, mass and lump, upper limb 12/24/2018   Pain in femur 12/24/2018   TIA (transient ischemic attack) 11/18/2018   Cortical age-related cataract of both eyes 11/02/2018   Fracture of multiple ribs 04/20/2018   Overweight (BMI 25.0-29.9) 04/20/2018   Hypertension 07/08/2017   Diabetes mellitus ([place]) 07/08/2017   Hyperlipidemia 07/08/2017   Tobacco use disorder 07/08/2017   Atherosclerosis of native arteries of extremity with intermittent claudication ([place]) 07/08/2017   SOB (shortness of breath) 06/19/2017   Acute on chronic respiratory failure with hypoxia and hypercapnia ([place]) 12/15/2016   CAP (community acquired pneumonia) 12/15/2016   Chronic pain 12/15/2016   Chronically on opiate therapy 12/15/2016   History of kidney stones 12/15/2016   Polypharmacy 12/15/2016   Somnolence 12/15/2016   Foot ulcer ([place]) 01/19/2016   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 [place] [address] Phone: [phone] Fax: [phone]     Social Determinants of Health (SDOH) Interventions    Readmission Risk Interventions Readmission Risk Prevention Plan 08/03/2021 11/15/2020  Transportation Screening Complete Complete  PCP or Specialist Appt within 3-5 Days - Not Complete  Not Complete comments - Pending discharge, unit clerk will schedule.  [place] or [place] - Complete  Social Work Consult for Recovery Care Planning/Counseling - Complete  Palliative Care Screening - Not Applicable  Medication Review Press photographer) Complete Complete  PCP or Specialist appointment within 3-5 days of discharge Complete -  [place] or Home Care Consult Complete -  SW Recovery Care/Counseling Consult Complete -  Palliative Care Screening Not Applicable -  Skilled Nursing Facility Complete -  Some recent data might be hidden

## 2021-08-03 NOTE — Progress Notes (Signed)
PROGRESS NOTE    Christian Fields  AYT:[id number] DOB: 1956-07-22 DOA: 08/02/2021 PCP: Remi Haggard, FNP   Brief Narrative:   Christian Fields is a 65 y.o. male with medical history significant of COPD, asthma, chronic pain syndrome, basal cell skin cancer, diabetes, hyperlipidemia, essential hypertension, chronic oxygen therapy on 3L at home who presented to the ER with shortness of breath, wheezing, and worsening hypoxia.  Patient's oxygen saturation were apparently dropping into the 70s on his 3 L at home.  He has been having exertional dyspnea also chronically.  He went to see his new PCP for the first time today and sats were in the 60s.  Set patient was sent to the ER for evaluation.   Assessment & Plan:   Acute on chronic respiratory failure with hypoxia secondary to COPD exacerbation:  Continue IV steroid, nebulizer treatment, doxycycline as well as supportive care.   Continue oxygen and titrate to 3 L, his home dose   Essential hypertension:  Continue blood pressure control.   Hyperlipidemia:  Continue home statin   Type 2 diabetes, non-insulin-dependent, well controlled:  A1c 5.6, continue hypoglycemic protocol, sliding scale insulin as needed Diabetic diet ongoing, education given at bedside   Obstructive sleep apnea:  CPAP at night as needed.   Chronic pain syndrome:  Continue daily Suboxone, patient indicates he has been taking this 3 times daily for unspecified amount of time but has not had a prescription in an unspecified amount of time and has been buying them off the street to maintain his medication supply.  We discussed he would need close follow-up with pain management in the outpatient setting which he has been putting off    Hyperlipidemia: Continue statin     DVT prophylaxis: Lovenox Code Status: Full code Family Communication: No family at bedside  Status is: Inpatient  Dispo: The patient is from: Home              Anticipated d/c is to: Home               Anticipated d/c date is: 24 to 48 hours              Patient currently not medically stable for discharge  Consultants:  None  Procedures:  None  Antimicrobials:  Doxycycline x5 days  Subjective: No acute issues or events overnight denies nausea vomiting diarrhea constipation headache fevers chills or chest pain  Objective: Vitals:   08/03/21 0130 08/03/21 0446 08/03/21 0502 08/03/21 0736  BP:  (!) 165/86    Pulse:  71    Resp:  20    Temp:  97.8 F (36.6 C)    TempSrc:  Oral    SpO2: 94% 99% 97% 92%  Weight:      Height:        Intake/Output Summary (Last 24 hours) at 08/03/2021 0757 Last data filed at 08/03/2021 0549 Gross per 24 hour  Intake 120 ml  Output 400 ml  Net -280 ml   Filed Weights   08/02/21 1234  Weight: 79.8 kg    Examination:  General:  Pleasantly resting in bed, No acute distress. HEENT:  Normocephalic atraumatic.  Sclerae nonicteric, noninjected.  Extraocular movements intact bilaterally. Neck:  Without mass or deformity.  Trachea is midline. Lungs:  Clear to auscultate bilaterally without rhonchi, wheeze, or rales. Heart:  Regular rate and rhythm.  Without murmurs, rubs, or gallops. Abdomen:  Soft, nontender, nondistended.  Without guarding or rebound. Extremities: Without cyanosis,  clubbing, edema, or obvious deformity. Vascular:  Dorsalis pedis and posterior tibial pulses palpable bilaterally. Skin:  Warm and dry, no erythema, no ulcerations.   Data Reviewed: I have personally reviewed following labs and imaging studies  CBC: Recent Labs  Lab 08/02/21 1244 08/03/21 0656  WBC 9.6 3.8*  NEUTROABS 7.2  --   HGB 10.8* 10.8*  HCT 34.0* 32.6*  MCV 97.1 96.2  PLT 198 160   Basic Metabolic Panel: Recent Labs  Lab 08/02/21 1244 08/03/21 0656  NA 137 139  K 4.3 4.1  CL 100 101  CO2 28 30  GLUCOSE 89 164*  BUN 19 19  CREATININE 1.11 1.02  CALCIUM 8.1* 8.1*   GFR: Estimated Creatinine Clearance: 68.9 mL/min (by C-G  formula based on SCr of 1.02 mg/dL). Liver Function Tests: Recent Labs  Lab 08/03/21 0656  AST 14*  ALT 9  ALKPHOS 104  BILITOT 0.8  PROT 7.1  ALBUMIN 3.2*   No results for input(s): LIPASE, AMYLASE in the last 168 hours. No results for input(s): AMMONIA in the last 168 hours. Coagulation Profile: No results for input(s): INR, PROTIME in the last 168 hours. Cardiac Enzymes: No results for input(s): CKTOTAL, CKMB, CKMBINDEX, TROPONINI in the last 168 hours. BNP (last 3 results) No results for input(s): PROBNP in the last 8760 hours. HbA1C: Recent Labs    08/02/21 1244  HGBA1C 5.6   CBG: Recent Labs  Lab 08/02/21 2220 08/03/21 0754  GLUCAP 210* 154*   Lipid Profile: No results for input(s): CHOL, HDL, LDLCALC, TRIG, CHOLHDL, LDLDIRECT in the last 72 hours. Thyroid Function Tests: No results for input(s): TSH, T4TOTAL, FREET4, T3FREE, THYROIDAB in the last 72 hours. Anemia Panel: No results for input(s): VITAMINB12, FOLATE, FERRITIN, TIBC, IRON, RETICCTPCT in the last 72 hours. Sepsis Labs: No results for input(s): PROCALCITON, LATICACIDVEN in the last 168 hours.  Recent Results (from the past 240 hour(s))  Resp Panel by RT-PCR (Flu A&B, Covid) Nasopharyngeal Swab     Status: None   Collection Time: 08/02/21  4:17 PM   Specimen: Nasopharyngeal Swab; Nasopharyngeal(NP) swabs in vial transport medium  Result Value Ref Range Status   SARS Coronavirus 2 by RT PCR NEGATIVE NEGATIVE Final    Comment: (NOTE) SARS-CoV-2 target nucleic acids are NOT DETECTED.  The SARS-CoV-2 RNA is generally detectable in upper respiratory specimens during the acute phase of infection. The lowest concentration of SARS-CoV-2 viral copies this assay can detect is 138 copies/mL. A negative result does not preclude SARS-Cov-2 infection and should not be used as the sole basis for treatment or other patient management decisions. A negative result may occur with  improper specimen  collection/handling, submission of specimen other than nasopharyngeal swab, presence of viral mutation(s) within the areas targeted by this assay, and inadequate number of viral copies(<138 copies/mL). A negative result must be combined with clinical observations, patient history, and epidemiological information. The expected result is Negative.  Fact Sheet for Patients:  EntrepreneurPulse.com.au  Fact Sheet for Healthcare Providers:  IncredibleEmployment.be  This test is no t yet approved or cleared by the [place] FDA and  has been authorized for detection and/or diagnosis of SARS-CoV-2 by FDA under an Emergency Use Authorization (EUA). This EUA will remain  in effect (meaning this test can be used) for the duration of the COVID-19 declaration under Section 564(b)(1) of the Act, 21 U.S.C.section 360bbb-3(b)(1), unless the authorization is terminated  or revoked sooner.       Influenza A by PCR NEGATIVE  NEGATIVE Final   Influenza B by PCR NEGATIVE NEGATIVE Final    Comment: (NOTE) The Xpert Xpress SARS-CoV-2/FLU/RSV plus assay is intended as an aid in the diagnosis of influenza from Nasopharyngeal swab specimens and should not be used as a sole basis for treatment. Nasal washings and aspirates are unacceptable for Xpert Xpress SARS-CoV-2/FLU/RSV testing.  Fact Sheet for Patients: EntrepreneurPulse.com.au  Fact Sheet for Healthcare Providers: IncredibleEmployment.be  This test is not yet approved or cleared by the [place] FDA and has been authorized for detection and/or diagnosis of SARS-CoV-2 by FDA under an Emergency Use Authorization (EUA). This EUA will remain in effect (meaning this test can be used) for the duration of the COVID-19 declaration under Section 564(b)(1) of the Act, 21 U.S.C. section 360bbb-3(b)(1), unless the authorization is terminated or revoked.  Performed at [facility], [address], [place], [place] [address]          Radiology Studies: DG Chest 2 View  Result Date: 08/02/2021 CLINICAL DATA:  Hypoxia. EXAM: CHEST - 2 VIEW COMPARISON:  Chest x-ray dated March 02, 2021. FINDINGS: The heart size and mediastinal contours are within normal limits. Similar emphysematous changes with coarsened interstitial markings. New opacity in the peripheral left upper lobe at the site of previously treated lung cancer. No pleural effusion or pneumothorax. No acute osseous abnormality. IMPRESSION: 1. New opacity in the peripheral left upper lobe at the site of previously treated lung cancer. This could reflect recurrence, post treatment change, or pneumonia. 2. COPD. Electronically Signed   By: Titus Dubin M.D.   On: 08/02/2021 13:31   CT Angio Chest PE W and/or Wo Contrast  Result Date: 08/02/2021 CLINICAL DATA:  Pulmonary embolus suspected with high probability. Low oxygen saturation. Chronic oxygen use. EXAM: CT ANGIOGRAPHY CHEST WITH CONTRAST TECHNIQUE: Multidetector CT imaging of the chest was performed using the standard protocol during bolus administration of intravenous contrast. Multiplanar CT image reconstructions and MIPs were obtained to evaluate the vascular anatomy. CONTRAST:  60mL OMNIPAQUE IOHEXOL 350 MG/ML SOLN COMPARISON:  Chest radiograph 08/02/2021.  CT chest 02/21/2021 FINDINGS: Cardiovascular: Good opacification of the central and segmental pulmonary arteries. No focal filling defects. No evidence of significant pulmonary embolus. Cardiac enlargement. No pericardial effusions. Normal caliber thoracic aorta. No aortic dissection. Great vessel origins are patent. Coronary artery and aortic calcifications. Mediastinum/Nodes: Bilateral hilar and mediastinal lymphadenopathy. Largest lymph node is in the right hilar region, measuring 2.7 cm short axis dimension. This is new since the previous CT. Thyroid gland is unremarkable. Esophagus is  decompressed. Lungs/Pleura: Diffuse emphysematous changes in the lungs. There is an area of consolidation in the posteroinferior left upper lung corresponding to lesion seen at chest radiograph. A prior CT scan demonstrated a mass in this area. The area of abnormality has enlarged significantly since that time with probable central mass lesion measuring 2.6 cm in diameter with surrounding consolidation. Combined with the new lymphadenopathy, this could represent recurrent or metastatic tumor with surrounding infiltration. Pneumonia would be a less likely consideration. No pleural effusions. No pneumothorax. Patchy infiltration or atelectasis in the lung bases. Upper Abdomen: No acute abnormalities demonstrated in the visualized upper abdomen. Musculoskeletal: Multiple old bilateral rib fractures. Focal area sclerosis in the right lateral fourth rib is likely a healed fracture. Sclerotic metastasis would be a less likely consideration. Old mid sternal fracture deformity. Degenerative changes in the spine. Review of the MIP images confirms the above findings. IMPRESSION: 1. No evidence of significant pulmonary embolus. 2. Mass  with surrounding consolidation in the left upper lung corresponding to chest radiographic abnormality. This is enlarging since previous CT and suggests recurrent or metastatic neoplasm. Pneumonia possible but felt less likely. 3. Enlarging bilateral hilar and mediastinal lymphadenopathy, possibly metastatic. 4. Multiple old rib and sternal fractures. 5. Aortic atherosclerosis and emphysema. Electronically Signed   By: Lucienne Capers M.D.   On: 08/02/2021 17:18   US Venous Img Lower Unilateral Left  Result Date: 08/02/2021 CLINICAL DATA:  leg swelling eval for dvt EXAM: LEFT LOWER EXTREMITY VENOUS DOPPLER ULTRASOUND TECHNIQUE: Gray-scale sonography with compression, as well as color and duplex ultrasound, were performed to evaluate the deep venous system(s) from the level of the common  femoral vein through the popliteal and proximal calf veins. COMPARISON:  None. FINDINGS: VENOUS Normal compressibility of the common femoral, superficial femoral, and popliteal veins, as well as the visualized calf veins. Visualized portions of profunda femoral vein and great saphenous vein unremarkable. No filling defects to suggest DVT on grayscale or color Doppler imaging. Doppler waveforms show normal direction of venous flow, normal respiratory plasticity and response to augmentation. Limited views of the contralateral common femoral vein are unremarkable. IMPRESSION: No evidence of DVT in the left lower extremity. Electronically Signed   By: Margaretha Sheffield M.D.   On: 08/02/2021 17:38        Scheduled Meds:  allopurinol  600 mg Oral Daily   aspirin EC  81 mg Oral Daily   buprenorphine-naloxone  1 tablet Sublingual Daily   cholecalciferol  1,000 Units Oral Daily   clopidogrel  75 mg Oral Daily   dapagliflozin propanediol  10 mg Oral Daily   doxycycline  100 mg Oral Q12H   DULoxetine  30 mg Oral Daily   enoxaparin (LOVENOX) injection  40 mg Subcutaneous Q24H   ezetimibe  10 mg Oral Daily   fluticasone furoate-vilanterol  1 puff Inhalation Daily   And   umeclidinium bromide  1 puff Inhalation Daily   gabapentin  800 mg Oral QID   insulin aspart  0-20 Units Subcutaneous TID WC   insulin aspart  0-5 Units Subcutaneous QHS   ipratropium  0.5 mg Nebulization Q6H   linagliptin  5 mg Oral Q breakfast   And   [START ON 08/05/2021] metFORMIN  2,000 mg Oral Q breakfast   methylPREDNISolone (SOLU-MEDROL) injection  125 mg Intravenous Q12H   Followed by   Derrill Memo ON 08/04/2021] predniSONE  40 mg Oral Q breakfast   metoprolol succinate  50 mg Oral Daily   pantoprazole  40 mg Oral Daily   simvastatin  40 mg Oral Daily     LOS: 1 day   Time spent: 36min  Shaquavia Whisonant C Trajon Rosete, DO [facility]  If 7PM-7AM, please contact night-coverage [email or web address]  08/03/2021, 7:57 AM

## 2021-08-03 NOTE — Progress Notes (Signed)
Physical Therapy Evaluation Patient Details Name: Christian Fields MRN: [id number] DOB: 1956/05/26 Today's Date: 08/03/2021   History of Present Illness  65 yo male with onset of SOB and wheezing, hypoxia and increased tremors was admitted.  Has been desaturating a great deal recently, having chronic pain, requiring more than baseline O2.  Cleared for PE and DVT, has lung masses per chart.  PMHx:  asthma, COPD, basal cell skin CA, DM, HLD, HTN, home O2 use, gout, HA, neuropathy, PNA, sleep apnea  Clinical Impression  Pt is evaluated today and has been assisted to stand with O2 sat dropping.  His initial reading was down to 89% then down to 84% on second stand.  Pt was taken up to 4L O2 for management but requires a length of time to recover to baseline.  Pt is appropriate for SNF due to sats and his lack of family help to stay with him, especially in light of his reading of sats at admission. Follow along as tolerated, focus on standing endurance and gait quality within his tolerance for sats levels.    Follow Up Recommendations SNF    Equipment Recommendations  None recommended by PT    Recommendations for Other Services       Precautions / Restrictions Precautions Precautions: Fall Precaution Comments: monitor O2 sats Restrictions Weight Bearing Restrictions: No      Mobility  Bed Mobility Overal bed mobility: Modified Independent             General bed mobility comments: extra time and uses rail    Transfers Overall transfer level: Needs assistance Equipment used: Rolling walker (2 wheeled);1 person hand held assist Transfers: Sit to/from Stand Sit to Stand: Supervision         General transfer comment: O2 sats drop from mid 90s into upper 80s with sit-to-stand transfer, recovers w/in 1 minute  Ambulation/Gait             General Gait Details: deferred over O2 sats  Stairs            Wheelchair Mobility    Modified Rankin (Stroke Patients Only)        Balance Overall balance assessment: Needs assistance Sitting-balance support: Feet supported Sitting balance-Leahy Scale: Good     Standing balance support: No upper extremity supported Standing balance-Leahy Scale: Fair                               Pertinent Vitals/Pain Pain Assessment: No/denies pain    Home Living Family/patient expects to be discharged to:: Private residence Living Arrangements: Alone Available Help at Discharge: Family;Available PRN/intermittently Type of Home: Mobile home Home Access: Stairs to enter Entrance Stairs-Rails: Right;Left Entrance Stairs-Number of Steps: 5 Home Layout: One level Home Equipment: Walker - 2 wheels;Shower seat;Cane - single point;Grab bars - tub/shower;Grab bars - toilet Additional Comments: using RW sporadically    Prior Function Level of Independence: Independent;Independent with assistive device(s)         Comments: has been able to walk independently but at times needs RW     Hand Dominance   Dominant Hand: Right    Extremity/Trunk Assessment   Upper Extremity Assessment Upper Extremity Assessment: Overall WFL for tasks assessed    Lower Extremity Assessment Lower Extremity Assessment: Overall WFL for tasks assessed    Cervical / Trunk Assessment Cervical / Trunk Assessment: Kyphotic  Communication   Communication: No difficulties  Cognition Arousal/Alertness: Awake/alert Behavior  During Therapy: WFL for tasks assessed/performed Overall Cognitive Status: Within Functional Limits for tasks assessed                                 General Comments: monitors his sats and aware of the drops, down to 40% range at admission      General Comments General comments (skin integrity, edema, etc.): multiple small wounds on toes, feet, legs    Exercises Other Exercises Other Exercises: Bed mobility, transfers, w/in ambulation, toileting, grooming, dressing   Assessment/Plan     PT Assessment Patient needs continued PT services  PT Problem List Decreased strength;Decreased balance;Decreased activity tolerance;Decreased mobility;Cardiopulmonary status limiting activity       PT Treatment Interventions DME instruction;Stair training;Gait training;Functional mobility training;Therapeutic activities;Therapeutic exercise;Balance training;Neuromuscular re-education;Patient/family education    PT Goals (Current goals can be found in the Care Plan section)  Acute Rehab PT Goals Patient Stated Goal: to get home PT Goal Formulation: With patient Time For Goal Achievement: 08/17/21 Potential to Achieve Goals: Good    Frequency Min 2X/week   Barriers to discharge Inaccessible home environment;Decreased caregiver support home alone with stairs to enter    Co-evaluation               AM-PAC PT "6 Clicks" Mobility  Outcome Measure Help needed turning from your back to your side while in a flat bed without using bedrails?: A Little Help needed moving from lying on your back to sitting on the side of a flat bed without using bedrails?: A Little Help needed moving to and from a bed to a chair (including a wheelchair)?: A Little Help needed standing up from a chair using your arms (e.g., wheelchair or bedside chair)?: A Little Help needed to walk in hospital room?: A Little Help needed climbing 3-5 steps with a railing? : A Lot 6 Click Score: 17    End of Session Equipment Utilized During Treatment: Gait belt;Oxygen Activity Tolerance: Patient limited by fatigue;Treatment limited secondary to medical complications (Comment) Patient left: in bed;with call bell/phone within reach;with bed alarm set Nurse Communication: Mobility status PT Visit Diagnosis: Unsteadiness on feet (R26.81);Muscle weakness (generalized) (M62.81);Difficulty in walking, not elsewhere classified (R26.2)    Time: 0973-5329 PT Time Calculation (min) (ACUTE ONLY): 34 min   Charges:   PT  Evaluation $PT Eval Moderate Complexity: 1 Mod PT Treatments $Therapeutic Activity: 8-22 mins       Ramond Dial 08/03/2021, 4:09 PM  Mee Hives, PT MS Acute Rehab Dept. Number: [place] [facility] [place]

## 2021-08-03 NOTE — Progress Notes (Signed)
Initial Nutrition Assessment  DOCUMENTATION CODES:  Not applicable  INTERVENTION:  Add Magic cup TID with meals, each supplement provides 290 kcal and 9 grams of protein.  Add MVI with minerals daily.  NUTRITION DIAGNOSIS:  Increased nutrient needs related to acute illness (COPD exacerbation) as evidenced by estimated needs.  GOAL:  Patient will meet greater than or equal to 90% of their needs  MONITOR:  PO intake, Supplement acceptance, Labs, Weight trends, I & O's  REASON FOR ASSESSMENT:  Consult Assessment of nutrition requirement/status  ASSESSMENT:  65 yo male with a PMH of COPD, asthma, chronic pain syndrome, basal cell skin cancer, T2DM, HLD, essential HTN, and chronic oxygen therapy on 3L at home who presents with COPD exacerbation.  Spoke with pt at bedside. He reports eating very well at home. He reports last night, he had catfish from the [employer] and he reports really enjoying it. This morning, he ate 90% of his breakfast per Epic.  Per Epic, pt's weight appears to be increasing from May 2022. However, pt does have mild RLE and moderate LLE edema noted.  On exam, pt with no significant depletions.  Recommend adding Magic Cup TID and MVI with minerals daily.  Medications: reviewed; Vitamin D3, SSI, Tradjenta, Metformin, Solu-Medrol, prednisone, Protonix  Labs: reviewed; CBG 87-210 HbA1c: 5.6% (08/02/2021)  NUTRITION - FOCUSED PHYSICAL EXAM: Flowsheet Row Most Recent Value  Orbital Region No depletion  Upper Arm Region No depletion  Thoracic and Lumbar Region No depletion  Buccal Region No depletion  Temple Region Mild depletion  Clavicle Bone Region No depletion  Clavicle and Acromion Bone Region No depletion  Scapular Bone Region No depletion  Dorsal Hand No depletion  Patellar Region No depletion  Anterior Thigh Region No depletion  Posterior Calf Region No depletion  Edema (RD Assessment) Moderate  [Mild in RLE, Moderate LLE]  Hair  Reviewed  Eyes Reviewed  Mouth Reviewed  Skin Reviewed  Nails Reviewed   Diet Order:   Diet Order             Diet Heart Room service appropriate? Yes; Fluid consistency: Thin  Diet effective now                  EDUCATION NEEDS:  Education needs have been addressed  Skin:  Skin Assessment: Reviewed RN Assessment (Ecchymosis)  Last BM:  unknown  Height:  Ht Readings from Last 1 Encounters:  08/02/21 5\' 8"  (1.727 m)   Weight:  Wt Readings from Last 1 Encounters:  08/02/21 79.8 kg   BMI:  Body mass index is 26.76 kg/m.  Estimated Nutritional Needs:  Kcal:  1950-2150 Protein:  90-105 grams Fluid:  >2 L  Derrel Nip, RD, LDN (she/her/hers) Registered Dietitian I After-Hours/Weekend Pager # in [phone]

## 2021-08-04 DIAGNOSIS — J441 Chronic obstructive pulmonary disease with (acute) exacerbation: Secondary | ICD-10-CM | POA: Diagnosis not present

## 2021-08-04 LAB — BASIC METABOLIC PANEL
Anion gap: 7 (ref 5–15)
BUN: 25 mg/dL — ABNORMAL HIGH (ref 8–23)
CO2: 30 mmol/L (ref 22–32)
Calcium: 8.1 mg/dL — ABNORMAL LOW (ref 8.9–10.3)
Chloride: 99 mmol/L (ref 98–111)
Creatinine, Ser: 1.01 mg/dL (ref 0.61–1.24)
GFR, Estimated: >60 mL/min (ref >60)
Glucose, Bld: 234 mg/dL — ABNORMAL HIGH (ref 70–99)
Potassium: 3.8 mmol/L (ref 3.5–5.1)
Sodium: 136 mmol/L (ref 135–145)

## 2021-08-04 LAB — CBC
HCT: 32.7 % — ABNORMAL LOW (ref 39.0–52.0)
Hemoglobin: 10.2 g/dL — ABNORMAL LOW (ref 13.0–17.0)
MCH: 29.8 pg (ref 26.0–34.0)
MCHC: 31.2 g/dL (ref 30.0–36.0)
MCV: 95.6 fL (ref 80.0–100.0)
Platelets: 187 10*3/uL (ref 150–400)
RBC: 3.42 MIL/uL — ABNORMAL LOW (ref 4.22–5.81)
RDW: 14.8 % (ref 11.5–15.5)
WBC: 10.3 10*3/uL (ref 4.0–10.5)
nRBC: 0 % (ref 0.0–0.2)

## 2021-08-04 LAB — GLUCOSE, CAPILLARY
Glucose-Capillary: 144 mg/dL — ABNORMAL HIGH (ref 70–99)
Glucose-Capillary: 127 mg/dL — ABNORMAL HIGH (ref 70–99)

## 2021-08-04 MED ORDER — DOXYCYCLINE HYCLATE 100 MG PO TABS: 100.0000 mg | ORAL_TABLET | Freq: Two times a day (BID) | ORAL | 0 refills | Status: DC

## 2021-08-04 MED ORDER — PREDNISONE 10 MG PO TABS: ORAL_TABLET | ORAL | 0 refills | Status: AC

## 2021-08-04 MED ORDER — UMECLIDINIUM BROMIDE 62.5 MCG/INH IN AEPB: 1.0000 | INHALATION_SPRAY | Freq: Every day | RESPIRATORY_TRACT | 0 refills | Status: AC

## 2021-08-04 MED ORDER — FLUTICASONE FUROATE-VILANTEROL 100-25 MCG/INH IN AEPB: 1.0000 | INHALATION_SPRAY | Freq: Every day | RESPIRATORY_TRACT | 0 refills | Status: AC

## 2021-08-04 NOTE — Progress Notes (Signed)
SATURATION QUALIFICATIONS: (This note is used to comply with regulatory documentation for home oxygen)  Patient Saturations on Room Air at Rest = 72%  Patient Saturations on Room Air while Ambulating = 60%  Patient Saturations on 3 Liters of oxygen while Ambulating = 94%  Please briefly explain why patient needs home oxygen:

## 2021-08-04 NOTE — Discharge Summary (Signed)
Physician Discharge Summary  Christian Fields VZC:[id number] DOB: 1956-02-11 DOA: 08/02/2021  PCP: Remi Haggard, FNP  Admit date: 08/02/2021 Discharge date: 08/04/2021  Admitted From: Home  Disposition: Home  Recommendations for Outpatient Follow-up:  Follow up with PCP in 1-2 weeks Please obtain BMP/CBC in one week Please follow up with pain management and pulmonology as scheduled  Home Health: None Equipment/Devices: Continue home oxygen at 3 L nasal cannula  Discharge Condition: Stable CODE STATUS: Full Diet recommendation: Low-salt low-fat low-carb diet  Brief/Interim Summary:  Christian Fields is a 65 y.o. male with medical history significant of COPD, asthma, chronic pain syndrome, basal cell skin cancer, diabetes, hyperlipidemia, essential hypertension, chronic oxygen therapy on 3L at home who presented to the ER with shortness of breath, wheezing, and worsening hypoxia.  Patient's oxygen saturation were apparently dropping into the 70s on his 3 L at home.  He has been having exertional dyspnea also chronically.  He went to see his new PCP for the first time today and sats were in the 60s.  Set patient was sent to the ER for evaluation.    Assessment & Plan:   Acute on chronic respiratory failure with hypoxia secondary to COPD exacerbation:  - Transition to p.o. steroid taper, continue 3 L nasal cannula around-the-clock as discussed, continue doxycycline until completed for another 2 days -Close follow-up with PCP and pulmonology as scheduled -New inhaled medications provided in the interim   Essential hypertension:  Continue current medications   Hyperlipidemia:  Continue home statin   Type 2 diabetes, non-insulin-dependent, well controlled:  A1c 5.6, continue diabetic diet and current home medications  Obstructive sleep apnea:  CPAP at night as needed.   Chronic pain syndrome:  Continue daily Suboxone, patient indicates he has been taking this 3 times daily for  unspecified amount of time but has not had a prescription in an unspecified amount of time and has been buying them off the street to maintain his medication supply.  We discussed he would need close follow-up with pain management in the outpatient setting which he has been putting off    Hyperlipidemia: Continue statin   Discharge Instructions  Discharge Instructions     Diet - low sodium heart healthy   Complete by: As directed    Increase activity slowly   Complete by: As directed       Allergies as of 08/04/2021       Reactions   Bee Venom Anaphylaxis        Medication List     STOP taking these medications    clonazePAM 1 MG tablet Commonly known as: KLONOPIN   Trelegy Ellipta 100-62.5-25 MCG/INH Aepb Generic drug: Fluticasone-Umeclidin-Vilant       TAKE these medications    albuterol 108 (90 Base) MCG/ACT inhaler Commonly known as: VENTOLIN HFA Inhale 2 puffs into the lungs every 4 (four) hours as needed for wheezing or shortness of breath.   allopurinol 300 MG tablet Commonly known as: ZYLOPRIM Take 600 mg by mouth daily.   aspirin EC 81 MG tablet Take 1 tablet by mouth daily.   buprenorphine-naloxone 8-2 mg Subl SL tablet Commonly known as: SUBOXONE Place 1 tablet under the tongue in the morning, at noon, and at bedtime.   cholecalciferol 1000 units tablet Commonly known as: VITAMIN D Take 1,000 Units by mouth daily.   clopidogrel 75 MG tablet Commonly known as: PLAVIX Take 1 tablet (75 mg total) by mouth daily.   doxycycline 100 MG  tablet Commonly known as: VIBRA-TABS Take 1 tablet (100 mg total) by mouth every 12 (twelve) hours for 2 days.   DULoxetine 30 MG capsule Commonly known as: CYMBALTA Take 1 capsule by mouth daily.   ezetimibe 10 MG tablet Commonly known as: ZETIA Take 10 mg by mouth daily.   famotidine 20 MG tablet Commonly known as: PEPCID Take 1 tablet (20 mg total) by mouth 2 (two) times daily.   Farxiga 10 MG Tabs  tablet Generic drug: dapagliflozin propanediol Take 10 mg by mouth daily.   fluticasone furoate-vilanterol 100-25 MCG/INH Aepb Commonly known as: BREO ELLIPTA Inhale 1 puff into the lungs daily for 30 doses. Start taking on: August 05, 2021   gabapentin 800 MG tablet Commonly known as: NEURONTIN Take 800 mg by mouth 4 (four) times daily. What changed: Another medication with the same name was removed. Continue taking this medication, and follow the directions you see here.   Kombiglyze XR 2.04-999 MG Tb24 Generic drug: Saxagliptin-Metformin Take 2 tablets by mouth daily.   metoprolol succinate 50 MG 24 hr tablet Commonly known as: TOPROL-XL Take 50 mg by mouth daily.   OXYGEN Inhale 2 L into the lungs.   pantoprazole 40 MG tablet Commonly known as: PROTONIX Take 40 mg by mouth daily.   predniSONE 10 MG tablet Commonly known as: DELTASONE Take 4 tablets (40 mg total) by mouth daily for 3 days, THEN 3 tablets (30 mg total) daily for 3 days, THEN 2 tablets (20 mg total) daily for 3 days, THEN 1 tablet (10 mg total) daily for 3 days. Start taking on: August 04, 2021   simvastatin 40 MG tablet Commonly known as: ZOCOR Take 1 tablet by mouth daily.   umeclidinium bromide 62.5 MCG/INH Aepb Commonly known as: INCRUSE ELLIPTA Inhale 1 puff into the lungs daily. Start taking on: August 05, 2021        Allergies  Allergen Reactions   Bee Venom Anaphylaxis    Consultations: None  Procedures/Studies: DG Chest 2 View  Result Date: 08/02/2021 CLINICAL DATA:  Hypoxia. EXAM: CHEST - 2 VIEW COMPARISON:  Chest x-ray dated March 02, 2021. FINDINGS: The heart size and mediastinal contours are within normal limits. Similar emphysematous changes with coarsened interstitial markings. New opacity in the peripheral left upper lobe at the site of previously treated lung cancer. No pleural effusion or pneumothorax. No acute osseous abnormality. IMPRESSION: 1. New opacity in the  peripheral left upper lobe at the site of previously treated lung cancer. This could reflect recurrence, post treatment change, or pneumonia. 2. COPD. Electronically Signed   By: Titus Dubin M.D.   On: 08/02/2021 13:31   CT Angio Chest PE W and/or Wo Contrast  Result Date: 08/02/2021 CLINICAL DATA:  Pulmonary embolus suspected with high probability. Low oxygen saturation. Chronic oxygen use. EXAM: CT ANGIOGRAPHY CHEST WITH CONTRAST TECHNIQUE: Multidetector CT imaging of the chest was performed using the standard protocol during bolus administration of intravenous contrast. Multiplanar CT image reconstructions and MIPs were obtained to evaluate the vascular anatomy. CONTRAST:  37mL OMNIPAQUE IOHEXOL 350 MG/ML SOLN COMPARISON:  Chest radiograph 08/02/2021.  CT chest 02/21/2021 FINDINGS: Cardiovascular: Good opacification of the central and segmental pulmonary arteries. No focal filling defects. No evidence of significant pulmonary embolus. Cardiac enlargement. No pericardial effusions. Normal caliber thoracic aorta. No aortic dissection. Great vessel origins are patent. Coronary artery and aortic calcifications. Mediastinum/Nodes: Bilateral hilar and mediastinal lymphadenopathy. Largest lymph node is in the right hilar region, measuring 2.7 cm short  axis dimension. This is new since the previous CT. Thyroid gland is unremarkable. Esophagus is decompressed. Lungs/Pleura: Diffuse emphysematous changes in the lungs. There is an area of consolidation in the posteroinferior left upper lung corresponding to lesion seen at chest radiograph. A prior CT scan demonstrated a mass in this area. The area of abnormality has enlarged significantly since that time with probable central mass lesion measuring 2.6 cm in diameter with surrounding consolidation. Combined with the new lymphadenopathy, this could represent recurrent or metastatic tumor with surrounding infiltration. Pneumonia would be a less likely consideration. No  pleural effusions. No pneumothorax. Patchy infiltration or atelectasis in the lung bases. Upper Abdomen: No acute abnormalities demonstrated in the visualized upper abdomen. Musculoskeletal: Multiple old bilateral rib fractures. Focal area sclerosis in the right lateral fourth rib is likely a healed fracture. Sclerotic metastasis would be a less likely consideration. Old mid sternal fracture deformity. Degenerative changes in the spine. Review of the MIP images confirms the above findings. IMPRESSION: 1. No evidence of significant pulmonary embolus. 2. Mass with surrounding consolidation in the left upper lung corresponding to chest radiographic abnormality. This is enlarging since previous CT and suggests recurrent or metastatic neoplasm. Pneumonia possible but felt less likely. 3. Enlarging bilateral hilar and mediastinal lymphadenopathy, possibly metastatic. 4. Multiple old rib and sternal fractures. 5. Aortic atherosclerosis and emphysema. Electronically Signed   By: Lucienne Capers M.D.   On: 08/02/2021 17:18   US Venous Img Lower Unilateral Left  Result Date: 08/02/2021 CLINICAL DATA:  leg swelling eval for dvt EXAM: LEFT LOWER EXTREMITY VENOUS DOPPLER ULTRASOUND TECHNIQUE: Gray-scale sonography with compression, as well as color and duplex ultrasound, were performed to evaluate the deep venous system(s) from the level of the common femoral vein through the popliteal and proximal calf veins. COMPARISON:  None. FINDINGS: VENOUS Normal compressibility of the common femoral, superficial femoral, and popliteal veins, as well as the visualized calf veins. Visualized portions of profunda femoral vein and great saphenous vein unremarkable. No filling defects to suggest DVT on grayscale or color Doppler imaging. Doppler waveforms show normal direction of venous flow, normal respiratory plasticity and response to augmentation. Limited views of the contralateral common femoral vein are unremarkable. IMPRESSION: No  evidence of DVT in the left lower extremity. Electronically Signed   By: Margaretha Sheffield M.D.   On: 08/02/2021 17:38     Subjective: No acute issues or events overnight denies nausea vomiting diarrhea constipation headache fevers chills chest pain   Discharge Exam: Vitals:   08/04/21 0422 08/04/21 0823  BP: 126/63 (!) 152/78  Pulse: 62 64  Resp: 20 (!) 24  Temp: 97.6 F (36.4 C) 97.9 F (36.6 C)  SpO2: 93% 98%   Vitals:   08/03/21 1936 08/03/21 2058 08/04/21 0422 08/04/21 0823  BP: 135/67  126/63 (!) 152/78  Pulse: 91  62 64  Resp: 20  20 (!) 24  Temp: 98.3 F (36.8 C)  97.6 F (36.4 C) 97.9 F (36.6 C)  TempSrc:   Oral Oral  SpO2: 91% 91% 93% 98%  Weight:      Height:        General: Pt is alert, awake, not in acute distress Cardiovascular: RRR, S1/S2 +, no rubs, no gallops Respiratory: CTA bilaterally, no wheezing, no rhonchi Abdominal: Soft, NT, ND, bowel sounds + Extremities: no edema, no cyanosis    The results of significant diagnostics from this hospitalization (including imaging, microbiology, ancillary and laboratory) are listed below for reference.  Microbiology: Recent Results (from the past 240 hour(s))  Resp Panel by RT-PCR (Flu A&B, Covid) Nasopharyngeal Swab     Status: None   Collection Time: 08/02/21  4:17 PM   Specimen: Nasopharyngeal Swab; Nasopharyngeal(NP) swabs in vial transport medium  Result Value Ref Range Status   SARS Coronavirus 2 by RT PCR NEGATIVE NEGATIVE Final    Comment: (NOTE) SARS-CoV-2 target nucleic acids are NOT DETECTED.  The SARS-CoV-2 RNA is generally detectable in upper respiratory specimens during the acute phase of infection. The lowest concentration of SARS-CoV-2 viral copies this assay can detect is 138 copies/mL. A negative result does not preclude SARS-Cov-2 infection and should not be used as the sole basis for treatment or other patient management decisions. A negative result may occur with  improper  specimen collection/handling, submission of specimen other than nasopharyngeal swab, presence of viral mutation(s) within the areas targeted by this assay, and inadequate number of viral copies(<138 copies/mL). A negative result must be combined with clinical observations, patient history, and epidemiological information. The expected result is Negative.  Fact Sheet for Patients:  EntrepreneurPulse.com.au  Fact Sheet for Healthcare Providers:  IncredibleEmployment.be  This test is no t yet approved or cleared by the [place] FDA and  has been authorized for detection and/or diagnosis of SARS-CoV-2 by FDA under an Emergency Use Authorization (EUA). This EUA will remain  in effect (meaning this test can be used) for the duration of the COVID-19 declaration under Section 564(b)(1) of the Act, 21 U.S.C.section 360bbb-3(b)(1), unless the authorization is terminated  or revoked sooner.       Influenza A by PCR NEGATIVE NEGATIVE Final   Influenza B by PCR NEGATIVE NEGATIVE Final    Comment: (NOTE) The Xpert Xpress SARS-CoV-2/FLU/RSV plus assay is intended as an aid in the diagnosis of influenza from Nasopharyngeal swab specimens and should not be used as a sole basis for treatment. Nasal washings and aspirates are unacceptable for Xpert Xpress SARS-CoV-2/FLU/RSV testing.  Fact Sheet for Patients: EntrepreneurPulse.com.au  Fact Sheet for Healthcare Providers: IncredibleEmployment.be  This test is not yet approved or cleared by the [place] FDA and has been authorized for detection and/or diagnosis of SARS-CoV-2 by FDA under an Emergency Use Authorization (EUA). This EUA will remain in effect (meaning this test can be used) for the duration of the COVID-19 declaration under Section 564(b)(1) of the Act, 21 U.S.C. section 360bbb-3(b)(1), unless the authorization is terminated or revoked.  Performed at  [facility], [address] [place], [place] [address]      Labs: BNP (last 3 results) Recent Labs    02/26/21 1911 03/02/21 0830 03/03/21 0428  BNP 1,149.5* 2,483.2* 6,789.3*   Basic Metabolic Panel: Recent Labs  Lab 08/02/21 1244 08/03/21 0656 08/04/21 0509  NA 137 139 136  K 4.3 4.1 3.8  CL 100 101 99  CO2 28 30 30   GLUCOSE 89 164* 234*  BUN 19 19 25*  CREATININE 1.11 1.02 1.01  CALCIUM 8.1* 8.1* 8.1*   Liver Function Tests: Recent Labs  Lab 08/03/21 0656  AST 14*  ALT 9  ALKPHOS 104  BILITOT 0.8  PROT 7.1  ALBUMIN 3.2*   No results for input(s): LIPASE, AMYLASE in the last 168 hours. No results for input(s): AMMONIA in the last 168 hours. CBC: Recent Labs  Lab 08/02/21 1244 08/03/21 0656 08/04/21 0509  WBC 9.6 3.8* 10.3  NEUTROABS 7.2  --   --   HGB 10.8* 10.8* 10.2*  HCT 34.0* 32.6*  32.7*  MCV 97.1 96.2 95.6  PLT 198 200 187   Cardiac Enzymes: No results for input(s): CKTOTAL, CKMB, CKMBINDEX, TROPONINI in the last 168 hours. BNP: Invalid input(s): POCBNP CBG: Recent Labs  Lab 08/03/21 1141 08/03/21 1639 08/03/21 2037 08/04/21 0820 08/04/21 1141  GLUCAP 190* 193* 323* 144* 127*   D-Dimer No results for input(s): DDIMER in the last 72 hours. Hgb A1c Recent Labs    08/02/21 1244  HGBA1C 5.6   Lipid Profile No results for input(s): CHOL, HDL, LDLCALC, TRIG, CHOLHDL, LDLDIRECT in the last 72 hours. Thyroid function studies No results for input(s): TSH, T4TOTAL, T3FREE, THYROIDAB in the last 72 hours.  Invalid input(s): FREET3 Anemia work up No results for input(s): VITAMINB12, FOLATE, FERRITIN, TIBC, IRON, RETICCTPCT in the last 72 hours. Urinalysis    Component Value Date/Time   COLORURINE YELLOW (A) 02/27/2021 0330   APPEARANCEUR HAZY (A) 02/27/2021 0330   APPEARANCEUR Clear 03/24/2013 1853   LABSPEC 1.017 02/27/2021 0330   LABSPEC 1.020 03/24/2013 1853   PHURINE 5.0 02/27/2021 0330   GLUCOSEU 150 (A)  02/27/2021 0330   GLUCOSEU Negative 03/24/2013 1853   HGBUR LARGE (A) 02/27/2021 0330   BILIRUBINUR NEGATIVE 02/27/2021 0330   BILIRUBINUR Negative 03/24/2013 1853   KETONESUR NEGATIVE 02/27/2021 0330   PROTEINUR 30 (A) 02/27/2021 0330   NITRITE NEGATIVE 02/27/2021 0330   LEUKOCYTESUR NEGATIVE 02/27/2021 0330   LEUKOCYTESUR Negative 03/24/2013 1853   Sepsis Labs Invalid input(s): PROCALCITONIN,  WBC,  LACTICIDVEN Microbiology Recent Results (from the past 240 hour(s))  Resp Panel by RT-PCR (Flu A&B, Covid) Nasopharyngeal Swab     Status: None   Collection Time: 08/02/21  4:17 PM   Specimen: Nasopharyngeal Swab; Nasopharyngeal(NP) swabs in vial transport medium  Result Value Ref Range Status   SARS Coronavirus 2 by RT PCR NEGATIVE NEGATIVE Final    Comment: (NOTE) SARS-CoV-2 target nucleic acids are NOT DETECTED.  The SARS-CoV-2 RNA is generally detectable in upper respiratory specimens during the acute phase of infection. The lowest concentration of SARS-CoV-2 viral copies this assay can detect is 138 copies/mL. A negative result does not preclude SARS-Cov-2 infection and should not be used as the sole basis for treatment or other patient management decisions. A negative result may occur with  improper specimen collection/handling, submission of specimen other than nasopharyngeal swab, presence of viral mutation(s) within the areas targeted by this assay, and inadequate number of viral copies(<138 copies/mL). A negative result must be combined with clinical observations, patient history, and epidemiological information. The expected result is Negative.  Fact Sheet for Patients:  EntrepreneurPulse.com.au  Fact Sheet for Healthcare Providers:  IncredibleEmployment.be  This test is no t yet approved or cleared by the [place] FDA and  has been authorized for detection and/or diagnosis of SARS-CoV-2 by FDA under an Emergency Use  Authorization (EUA). This EUA will remain  in effect (meaning this test can be used) for the duration of the COVID-19 declaration under Section 564(b)(1) of the Act, 21 U.S.C.section 360bbb-3(b)(1), unless the authorization is terminated  or revoked sooner.       Influenza A by PCR NEGATIVE NEGATIVE Final   Influenza B by PCR NEGATIVE NEGATIVE Final    Comment: (NOTE) The Xpert Xpress SARS-CoV-2/FLU/RSV plus assay is intended as an aid in the diagnosis of influenza from Nasopharyngeal swab specimens and should not be used as a sole basis for treatment. Nasal washings and aspirates are unacceptable for Xpert Xpress SARS-CoV-2/FLU/RSV testing.  Fact Sheet for Patients: EntrepreneurPulse.com.au  Fact Sheet for Healthcare Providers: IncredibleEmployment.be  This test is not yet approved or cleared by the [place] FDA and has been authorized for detection and/or diagnosis of SARS-CoV-2 by FDA under an Emergency Use Authorization (EUA). This EUA will remain in effect (meaning this test can be used) for the duration of the COVID-19 declaration under Section 564(b)(1) of the Act, 21 U.S.C. section 360bbb-3(b)(1), unless the authorization is terminated or revoked.  Performed at [facility], [address], [place], [place] [address]      Time coordinating discharge: Over 30 minutes  SIGNED:   Little Ishikawa, DO [facility] 08/04/2021, 3:40 PM Pager   If 7PM-7AM, please contact night-coverage [email or web address]

## 2021-08-05 ENCOUNTER — Emergency Department: Payer: Medicare HMO

## 2021-08-05 ENCOUNTER — Other Ambulatory Visit: Payer: Self-pay

## 2021-08-05 ENCOUNTER — Inpatient Hospital Stay
Admission: EM | Admit: 2021-08-05 | Discharge: 2021-08-07 | DRG: 871 | Disposition: A | Discharge status: Home / Self Care | Payer: Medicare HMO | Attending: Internal Medicine | Admitting: Internal Medicine

## 2021-08-05 ENCOUNTER — Encounter: Payer: Self-pay | Admitting: Internal Medicine

## 2021-08-05 DIAGNOSIS — Z20822 Contact with and (suspected) exposure to covid-19: Secondary | ICD-10-CM | POA: Diagnosis present

## 2021-08-05 DIAGNOSIS — Z91030 Bee allergy status: Secondary | ICD-10-CM

## 2021-08-05 DIAGNOSIS — J9622 Acute and chronic respiratory failure with hypercapnia: Secondary | ICD-10-CM

## 2021-08-05 DIAGNOSIS — J189 Pneumonia, unspecified organism: Secondary | ICD-10-CM | POA: Diagnosis present

## 2021-08-05 DIAGNOSIS — F1920 Other psychoactive substance dependence, uncomplicated: Secondary | ICD-10-CM | POA: Diagnosis present

## 2021-08-05 DIAGNOSIS — Z85828 Personal history of other malignant neoplasm of skin: Secondary | ICD-10-CM | POA: Diagnosis not present

## 2021-08-05 DIAGNOSIS — G459 Transient cerebral ischemic attack, unspecified: Secondary | ICD-10-CM

## 2021-08-05 DIAGNOSIS — J690 Pneumonitis due to inhalation of food and vomit: Secondary | ICD-10-CM

## 2021-08-05 DIAGNOSIS — N179 Acute kidney failure, unspecified: Secondary | ICD-10-CM | POA: Diagnosis present

## 2021-08-05 DIAGNOSIS — G4730 Sleep apnea, unspecified: Secondary | ICD-10-CM | POA: Diagnosis not present

## 2021-08-05 DIAGNOSIS — E119 Type 2 diabetes mellitus without complications: Secondary | ICD-10-CM | POA: Diagnosis present

## 2021-08-05 DIAGNOSIS — M109 Gout, unspecified: Secondary | ICD-10-CM | POA: Diagnosis present

## 2021-08-05 DIAGNOSIS — F1120 Opioid dependence, uncomplicated: Secondary | ICD-10-CM | POA: Diagnosis present

## 2021-08-05 DIAGNOSIS — Z86718 Personal history of other venous thrombosis and embolism: Secondary | ICD-10-CM | POA: Diagnosis not present

## 2021-08-05 DIAGNOSIS — A419 Sepsis, unspecified organism: Principal | ICD-10-CM | POA: Diagnosis present

## 2021-08-05 DIAGNOSIS — J9621 Acute and chronic respiratory failure with hypoxia: Secondary | ICD-10-CM | POA: Diagnosis present

## 2021-08-05 DIAGNOSIS — E11621 Type 2 diabetes mellitus with foot ulcer: Secondary | ICD-10-CM | POA: Diagnosis present

## 2021-08-05 DIAGNOSIS — J441 Chronic obstructive pulmonary disease with (acute) exacerbation: Secondary | ICD-10-CM | POA: Diagnosis present

## 2021-08-05 DIAGNOSIS — M1990 Unspecified osteoarthritis, unspecified site: Secondary | ICD-10-CM | POA: Diagnosis present

## 2021-08-05 DIAGNOSIS — Z8249 Family history of ischemic heart disease and other diseases of the circulatory system: Secondary | ICD-10-CM | POA: Diagnosis not present

## 2021-08-05 DIAGNOSIS — Z8673 Personal history of transient ischemic attack (TIA), and cerebral infarction without residual deficits: Secondary | ICD-10-CM | POA: Diagnosis not present

## 2021-08-05 DIAGNOSIS — I5032 Chronic diastolic (congestive) heart failure: Secondary | ICD-10-CM | POA: Diagnosis present

## 2021-08-05 DIAGNOSIS — C3492 Malignant neoplasm of unspecified part of left bronchus or lung: Secondary | ICD-10-CM | POA: Diagnosis not present

## 2021-08-05 DIAGNOSIS — F32A Depression, unspecified: Secondary | ICD-10-CM | POA: Diagnosis present

## 2021-08-05 DIAGNOSIS — Z85118 Personal history of other malignant neoplasm of bronchus and lung: Secondary | ICD-10-CM | POA: Diagnosis not present

## 2021-08-05 DIAGNOSIS — I130 Hypertensive heart and chronic kidney disease with heart failure and stage 1 through stage 4 chronic kidney disease, or unspecified chronic kidney disease: Secondary | ICD-10-CM | POA: Diagnosis present

## 2021-08-05 DIAGNOSIS — I10 Essential (primary) hypertension: Secondary | ICD-10-CM | POA: Diagnosis not present

## 2021-08-05 DIAGNOSIS — Z9119 Patient's noncompliance with other medical treatment and regimen: Secondary | ICD-10-CM

## 2021-08-05 DIAGNOSIS — E1142 Type 2 diabetes mellitus with diabetic polyneuropathy: Secondary | ICD-10-CM | POA: Diagnosis present

## 2021-08-05 DIAGNOSIS — Z7189 Other specified counseling: Secondary | ICD-10-CM | POA: Diagnosis not present

## 2021-08-05 DIAGNOSIS — J440 Chronic obstructive pulmonary disease with acute lower respiratory infection: Secondary | ICD-10-CM | POA: Diagnosis present

## 2021-08-05 DIAGNOSIS — R1310 Dysphagia, unspecified: Secondary | ICD-10-CM | POA: Diagnosis present

## 2021-08-05 DIAGNOSIS — F419 Anxiety disorder, unspecified: Secondary | ICD-10-CM | POA: Diagnosis present

## 2021-08-05 DIAGNOSIS — E1122 Type 2 diabetes mellitus with diabetic chronic kidney disease: Secondary | ICD-10-CM | POA: Diagnosis present

## 2021-08-05 DIAGNOSIS — G8929 Other chronic pain: Secondary | ICD-10-CM | POA: Diagnosis present

## 2021-08-05 DIAGNOSIS — K219 Gastro-esophageal reflux disease without esophagitis: Secondary | ICD-10-CM | POA: Diagnosis present

## 2021-08-05 DIAGNOSIS — L97519 Non-pressure chronic ulcer of other part of right foot with unspecified severity: Secondary | ICD-10-CM | POA: Diagnosis present

## 2021-08-05 DIAGNOSIS — N1831 Chronic kidney disease, stage 3a: Secondary | ICD-10-CM | POA: Diagnosis present

## 2021-08-05 DIAGNOSIS — Y95 Nosocomial condition: Secondary | ICD-10-CM | POA: Diagnosis present

## 2021-08-05 DIAGNOSIS — E785 Hyperlipidemia, unspecified: Secondary | ICD-10-CM | POA: Diagnosis present

## 2021-08-05 DIAGNOSIS — G4733 Obstructive sleep apnea (adult) (pediatric): Secondary | ICD-10-CM | POA: Diagnosis present

## 2021-08-05 DIAGNOSIS — Z9981 Dependence on supplemental oxygen: Secondary | ICD-10-CM

## 2021-08-05 DIAGNOSIS — M549 Dorsalgia, unspecified: Secondary | ICD-10-CM | POA: Diagnosis present

## 2021-08-05 LAB — URINALYSIS, COMPLETE (UACMP) WITH MICROSCOPIC
Bacteria, UA: NONE SEEN
Bilirubin Urine: NEGATIVE
Glucose, UA: >=500 mg/dL — AB
Ketones, ur: NEGATIVE mg/dL
Leukocytes,Ua: NEGATIVE
Nitrite: NEGATIVE
Protein, ur: NEGATIVE mg/dL
Specific Gravity, Urine: 1.024 (ref 1.005–1.030)
Squamous Epithelial / HPF: NONE SEEN (ref 0–5)
pH: 5 (ref 5.0–8.0)

## 2021-08-05 LAB — CBC WITH DIFFERENTIAL/PLATELET
Abs Immature Granulocytes: 0.43 10*3/uL — ABNORMAL HIGH (ref 0.00–0.07)
Basophils Absolute: 0 10*3/uL (ref 0.0–0.1)
Basophils Relative: 0 %
Eosinophils Absolute: 0 10*3/uL (ref 0.0–0.5)
Eosinophils Relative: 0 %
HCT: 39.7 % (ref 39.0–52.0)
Hemoglobin: 12.5 g/dL — ABNORMAL LOW (ref 13.0–17.0)
Immature Granulocytes: 2 %
Lymphocytes Relative: 7 %
Lymphs Abs: 2 10*3/uL (ref 0.7–4.0)
MCH: 31.2 pg (ref 26.0–34.0)
MCHC: 31.5 g/dL (ref 30.0–36.0)
MCV: 99 fL (ref 80.0–100.0)
Monocytes Absolute: 1.2 10*3/uL — ABNORMAL HIGH (ref 0.1–1.0)
Monocytes Relative: 4 %
Neutro Abs: 23.4 10*3/uL — ABNORMAL HIGH (ref 1.7–7.7)
Neutrophils Relative %: 87 %
Platelets: 308 10*3/uL (ref 150–400)
RBC: 4.01 MIL/uL — ABNORMAL LOW (ref 4.22–5.81)
RDW: 15.5 % (ref 11.5–15.5)
Smear Review: NORMAL
WBC: 27.1 10*3/uL — ABNORMAL HIGH (ref 4.0–10.5)
nRBC: 0.2 % (ref 0.0–0.2)

## 2021-08-05 LAB — LACTIC ACID, PLASMA: Lactic Acid, Venous: 1.6 mmol/L (ref 0.5–1.9)

## 2021-08-05 LAB — BASIC METABOLIC PANEL
Anion gap: 12 (ref 5–15)
BUN: 36 mg/dL — ABNORMAL HIGH (ref 8–23)
CO2: 30 mmol/L (ref 22–32)
Calcium: 8 mg/dL — ABNORMAL LOW (ref 8.9–10.3)
Chloride: 98 mmol/L (ref 98–111)
Creatinine, Ser: 1.73 mg/dL — ABNORMAL HIGH (ref 0.61–1.24)
GFR, Estimated: 43 mL/min — ABNORMAL LOW (ref >60)
Glucose, Bld: 156 mg/dL — ABNORMAL HIGH (ref 70–99)
Potassium: 3.5 mmol/L (ref 3.5–5.1)
Sodium: 140 mmol/L (ref 135–145)

## 2021-08-05 LAB — URINE DRUG SCREEN, QUALITATIVE (ARMC ONLY)
Amphetamines, Ur Screen: NOT DETECTED
Barbiturates, Ur Screen: NOT DETECTED
Benzodiazepine, Ur Scrn: POSITIVE — AB
Cannabinoid 50 Ng, Ur ~~LOC~~: NOT DETECTED
Cocaine Metabolite,Ur ~~LOC~~: NOT DETECTED
MDMA (Ecstasy)Ur Screen: NOT DETECTED
Methadone Scn, Ur: NOT DETECTED
Opiate, Ur Screen: NOT DETECTED
Phencyclidine (PCP) Ur S: NOT DETECTED
Tricyclic, Ur Screen: NOT DETECTED

## 2021-08-05 LAB — CBG MONITORING, ED: Glucose-Capillary: 210 mg/dL — ABNORMAL HIGH (ref 70–99)

## 2021-08-05 LAB — BRAIN NATRIURETIC PEPTIDE: B Natriuretic Peptide: 798.1 pg/mL — ABNORMAL HIGH (ref 0.0–100.0)

## 2021-08-05 LAB — GLUCOSE, CAPILLARY: Glucose-Capillary: 194 mg/dL — ABNORMAL HIGH (ref 70–99)

## 2021-08-05 LAB — RESP PANEL BY RT-PCR (FLU A&B, COVID) ARPGX2
Influenza A by PCR: NEGATIVE
Influenza B by PCR: NEGATIVE
SARS Coronavirus 2 by RT PCR: NEGATIVE

## 2021-08-05 LAB — PROCALCITONIN: Procalcitonin: 0.89 ng/mL

## 2021-08-05 LAB — TROPONIN I (HIGH SENSITIVITY): Troponin I (High Sensitivity): 17 ng/L (ref <18)

## 2021-08-05 MED ORDER — DM-GUAIFENESIN ER 30-600 MG PO TB12: 1.0000 | ORAL_TABLET | Freq: Two times a day (BID) | ORAL | Status: DC | PRN

## 2021-08-05 MED ORDER — EZETIMIBE 10 MG PO TABS
10.0000 mg | ORAL_TABLET | Freq: Every day | ORAL | Status: DC
  Administered 2021-08-05 – 2021-08-07 (×3): 10 mg via ORAL
  Filled 2021-08-05 (×4): qty 1

## 2021-08-05 MED ORDER — GABAPENTIN 400 MG PO CAPS
400.0000 mg | ORAL_CAPSULE | Freq: Four times a day (QID) | ORAL | Status: DC
  Administered 2021-08-05: 400 mg via ORAL
  Filled 2021-08-05: qty 1

## 2021-08-05 MED ORDER — HYDRALAZINE HCL 20 MG/ML IJ SOLN: 5.0000 mg | INTRAMUSCULAR | Status: DC | PRN

## 2021-08-05 MED ORDER — BUPRENORPHINE HCL-NALOXONE HCL 8-2 MG SL SUBL
1.0000 | SUBLINGUAL_TABLET | Freq: Three times a day (TID) | SUBLINGUAL | Status: DC
  Administered 2021-08-05 – 2021-08-07 (×5): 1 via SUBLINGUAL
  Filled 2021-08-05 (×6): qty 1

## 2021-08-05 MED ORDER — SIMVASTATIN 20 MG PO TABS
40.0000 mg | ORAL_TABLET | Freq: Every day | ORAL | Status: DC
  Administered 2021-08-05 – 2021-08-07 (×3): 40 mg via ORAL
  Filled 2021-08-05: qty 2
  Filled 2021-08-05: qty 4
  Filled 2021-08-05: qty 2

## 2021-08-05 MED ORDER — METHYLPREDNISOLONE SODIUM SUCC 40 MG IJ SOLR
40.0000 mg | Freq: Two times a day (BID) | INTRAMUSCULAR | Status: DC
  Administered 2021-08-05 – 2021-08-07 (×4): 40 mg via INTRAVENOUS
  Filled 2021-08-05 (×4): qty 1

## 2021-08-05 MED ORDER — CLOPIDOGREL BISULFATE 75 MG PO TABS
75.0000 mg | ORAL_TABLET | Freq: Every day | ORAL | Status: DC
  Administered 2021-08-05 – 2021-08-07 (×3): 75 mg via ORAL
  Filled 2021-08-05 (×3): qty 1

## 2021-08-05 MED ORDER — MAGNESIUM SULFATE 2 GM/50ML IV SOLN
2.0000 g | Freq: Once | INTRAVENOUS | Status: AC
  Administered 2021-08-05: 2 g via INTRAVENOUS
  Filled 2021-08-05: qty 50

## 2021-08-05 MED ORDER — IPRATROPIUM-ALBUTEROL 0.5-2.5 (3) MG/3ML IN SOLN
3.0000 mL | RESPIRATORY_TRACT | Status: DC
  Administered 2021-08-05 – 2021-08-07 (×14): 3 mL via RESPIRATORY_TRACT
  Filled 2021-08-05 (×13): qty 3

## 2021-08-05 MED ORDER — METOPROLOL SUCCINATE ER 50 MG PO TB24
50.0000 mg | ORAL_TABLET | Freq: Every day | ORAL | Status: DC
  Administered 2021-08-05 – 2021-08-07 (×3): 50 mg via ORAL
  Filled 2021-08-05 (×3): qty 1

## 2021-08-05 MED ORDER — PIPERACILLIN-TAZOBACTAM 3.375 G IVPB 30 MIN
3.3750 g | Freq: Once | INTRAVENOUS | Status: AC
  Administered 2021-08-05: 3.375 g via INTRAVENOUS
  Filled 2021-08-05: qty 50

## 2021-08-05 MED ORDER — ALLOPURINOL 300 MG PO TABS
300.0000 mg | ORAL_TABLET | Freq: Every day | ORAL | Status: DC
  Administered 2021-08-06 – 2021-08-07 (×2): 300 mg via ORAL
  Filled 2021-08-05 (×2): qty 1

## 2021-08-05 MED ORDER — ALLOPURINOL 300 MG PO TABS: 600.0000 mg | ORAL_TABLET | Freq: Every day | ORAL | Status: DC

## 2021-08-05 MED ORDER — DULOXETINE HCL 30 MG PO CPEP
30.0000 mg | ORAL_CAPSULE | Freq: Every day | ORAL | Status: DC
  Administered 2021-08-05 – 2021-08-07 (×3): 30 mg via ORAL
  Filled 2021-08-05 (×4): qty 1

## 2021-08-05 MED ORDER — ALBUTEROL SULFATE (2.5 MG/3ML) 0.083% IN NEBU
2.5000 mg | INHALATION_SOLUTION | RESPIRATORY_TRACT | Status: DC | PRN
  Administered 2021-08-06: 2.5 mg via RESPIRATORY_TRACT
  Filled 2021-08-05 (×2): qty 3

## 2021-08-05 MED ORDER — GABAPENTIN 400 MG PO CAPS: 800.0000 mg | ORAL_CAPSULE | Freq: Four times a day (QID) | ORAL | Status: DC

## 2021-08-05 MED ORDER — VITAMIN D 25 MCG (1000 UNIT) PO TABS
1000.0000 [IU] | ORAL_TABLET | Freq: Every day | ORAL | Status: DC
  Administered 2021-08-05 – 2021-08-07 (×3): 1000 [IU] via ORAL
  Filled 2021-08-05 (×3): qty 1

## 2021-08-05 MED ORDER — ONDANSETRON HCL 4 MG/2ML IJ SOLN: 4.0000 mg | Freq: Three times a day (TID) | INTRAMUSCULAR | Status: DC | PRN

## 2021-08-05 MED ORDER — SODIUM CHLORIDE 0.9 % IV SOLN
500.0000 mg | Freq: Once | INTRAVENOUS | Status: DC
  Filled 2021-08-05: qty 500

## 2021-08-05 MED ORDER — VANCOMYCIN HCL IN DEXTROSE 1-5 GM/200ML-% IV SOLN
1000.0000 mg | INTRAVENOUS | Status: DC
  Filled 2021-08-05: qty 200

## 2021-08-05 MED ORDER — GABAPENTIN 400 MG PO CAPS
400.0000 mg | ORAL_CAPSULE | Freq: Once | ORAL | Status: AC
  Administered 2021-08-05: 400 mg via ORAL
  Filled 2021-08-05: qty 1

## 2021-08-05 MED ORDER — SODIUM CHLORIDE 0.9 % IV SOLN
2.0000 g | Freq: Two times a day (BID) | INTRAVENOUS | Status: DC
  Administered 2021-08-05 (×2): 2 g via INTRAVENOUS
  Filled 2021-08-05 (×4): qty 2

## 2021-08-05 MED ORDER — VANCOMYCIN HCL 1750 MG/350ML IV SOLN
1750.0000 mg | Freq: Once | INTRAVENOUS | Status: AC
  Administered 2021-08-05: 1750 mg via INTRAVENOUS
  Filled 2021-08-05: qty 350

## 2021-08-05 MED ORDER — GABAPENTIN 800 MG PO TABS
800.0000 mg | ORAL_TABLET | Freq: Four times a day (QID) | ORAL | Status: DC
Start: 2021-08-05 — End: 2021-08-05

## 2021-08-05 MED ORDER — INSULIN ASPART 100 UNIT/ML IJ SOLN
0.0000 [IU] | Freq: Three times a day (TID) | INTRAMUSCULAR | Status: DC
  Administered 2021-08-05: 3 [IU] via SUBCUTANEOUS
  Administered 2021-08-06: 1 [IU] via SUBCUTANEOUS
  Administered 2021-08-06 (×2): 2 [IU] via SUBCUTANEOUS
  Administered 2021-08-07: 5 [IU] via SUBCUTANEOUS
  Filled 2021-08-05 (×5): qty 1

## 2021-08-05 MED ORDER — ENOXAPARIN SODIUM 40 MG/0.4ML IJ SOSY
40.0000 mg | PREFILLED_SYRINGE | INTRAMUSCULAR | Status: DC
  Administered 2021-08-05 – 2021-08-06 (×2): 40 mg via SUBCUTANEOUS
  Filled 2021-08-05 (×2): qty 0.4

## 2021-08-05 MED ORDER — PANTOPRAZOLE SODIUM 40 MG PO TBEC
40.0000 mg | DELAYED_RELEASE_TABLET | Freq: Every day | ORAL | Status: DC
  Administered 2021-08-05 – 2021-08-07 (×3): 40 mg via ORAL
  Filled 2021-08-05 (×3): qty 1

## 2021-08-05 MED ORDER — INSULIN ASPART 100 UNIT/ML IJ SOLN
0.0000 [IU] | Freq: Every day | INTRAMUSCULAR | Status: DC
  Administered 2021-08-06: 2 [IU] via SUBCUTANEOUS
  Filled 2021-08-05: qty 1

## 2021-08-05 MED ORDER — ACETAMINOPHEN 325 MG PO TABS
650.0000 mg | ORAL_TABLET | Freq: Four times a day (QID) | ORAL | Status: DC | PRN
Start: 2021-08-05 — End: 2021-08-07

## 2021-08-05 MED ORDER — ASPIRIN EC 81 MG PO TBEC
81.0000 mg | DELAYED_RELEASE_TABLET | Freq: Every day | ORAL | Status: DC
  Administered 2021-08-05 – 2021-08-07 (×3): 81 mg via ORAL
  Filled 2021-08-05 (×3): qty 1

## 2021-08-05 MED ORDER — IPRATROPIUM-ALBUTEROL 0.5-2.5 (3) MG/3ML IN SOLN
3.0000 mL | Freq: Once | RESPIRATORY_TRACT | Status: AC
  Administered 2021-08-05: 3 mL via RESPIRATORY_TRACT
  Filled 2021-08-05: qty 3

## 2021-08-05 MED ORDER — CLONAZEPAM 1 MG PO TABS
1.0000 mg | ORAL_TABLET | Freq: Three times a day (TID) | ORAL | Status: DC | PRN
  Administered 2021-08-05 – 2021-08-07 (×4): 1 mg via ORAL
  Filled 2021-08-05 (×4): qty 1

## 2021-08-05 NOTE — ED Notes (Signed)
Pt resting comfortably at this time. Pt currently on 4L/min and doing well. Pt given water per request. NAD noted. Call bell in reach.

## 2021-08-05 NOTE — ED Notes (Signed)
Pt resting at this time, pt does appear to be in NAD at this time. Bipap set at 40% fi02. Daughter at bedside. Call bell in reach.

## 2021-08-05 NOTE — TOC Initial Note (Signed)
Transition of Care [facility]) - Initial/Assessment Note    Patient Details  Name: Christian Fields MRN: [id number] Date of Birth: Jul 29, 1956  Transition of Care [facility]) CM/SW Contact:    Ova Freshwater Phone Number: [phone] 08/05/2021, 10:06 AM  Clinical Narrative:                  Patient presents to [facility] due to cough and shortness of breath, denies fever or chest pain. Patient refused SNF placement recommendation before he discharged on 08/04/2021.Referral was made to [place] w/ [facility] for PT, OT, RN and Aide, and [place] [facility] for [facility]. Patient's main contact [name] (Daughter) [phone] [facility]). From last admission Ms. Doy Mince is in the process of hiring a home caregiver for the patient.        Expected Discharge Plan: [place] Barriers to Discharge: Continued Medical Work up   Patient Goals and CMS Choice        Expected Discharge Plan and Services Expected Discharge Plan: [place] In-house Referral: Clinical Social Work     Living arrangements for the past 2 months: Mobile Home                                      Prior Living Arrangements/Services Living arrangements for the past 2 months: Mobile Home Lives with:: Self Patient language and need for interpreter reviewed:: Yes Do you feel safe going back to the place where you live?: Yes        Care giver support system in place?: Yes (comment) Current home services: Home OT, Home PT, Home RN, Other (comment) ([facility], [place] [facility] for [facility]) Criminal Activity/Legal Involvement Pertinent to Current Situation/Hospitalization: No - Comment as needed  Activities of Daily Living      Permission Sought/Granted Permission sought to share information with : Family Supports Permission granted to share information with : Yes, Verbal Permission Granted  Share Information with NAME: [name] (Daughter)   [phone]  (Mobile)  Permission granted to share info w AGENCY: [facility], [facility]        Emotional Assessment Appearance:: Appears stated age Attitude/Demeanor/Rapport: Engaged Affect (typically observed): Stable Orientation: : Oriented to Self, Oriented to Place, Oriented to  Time, Oriented to Situation Alcohol / Substance Use: Not Applicable Psych Involvement: No (comment)  Admission diagnosis:  Acute on chronic respiratory failure with hypoxia and hypercapnia (HCC) [A41.66, J96.22] Patient Active Problem List   Diagnosis Date Noted   Sepsis ([place]) 08/05/2021   Gout 08/05/2021   Depression 08/05/2021   Chronic diastolic CHF (congestive heart failure) ([place]) 08/05/2021   HCAP (healthcare-associated pneumonia) 08/05/2021   COPD exacerbation ([place]) 08/02/2021   Acute on chronic respiratory failure with hypoxia ([place]) 03/08/2021   Septic shock (HCC) 02/26/2021   Polysubstance (excluding opioids) dependence ([place]) 06/07/1600   Acute metabolic encephalopathy 09/32/3557   COPD with acute exacerbation ([place]) 01/29/2021   Iron deficiency anemia 11/27/2020   Severe sepsis (HCC)    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   Left leg swelling 04/28/2020   Confusion    Acute respiratory failure with hypoxia ([place]) 03/26/2020   Pneumonia of both lower lobes due to infectious organism 03/25/2020   AKI (acute kidney injury) ([place]) 01/13/2020   Cellulitis of left leg 01/12/2020   COVID-19 virus detected 01/12/2020   Diabetic ulcer of left midfoot associated with type 2 diabetes mellitus,  with fat layer exposed ([place]) 01/12/2020   Panlobular emphysema ([place]) 01/12/2020   Arthritis 10/29/2019   Hemorrhoids 10/29/2019   Senile nuclear sclerosis, bilateral 02/15/2019   Cellulitis of left upper limb 02/11/2019   AAA (abdominal aortic aneurysm) without rupture (HCC) 01/15/2019   Localized swelling, mass and lump, upper limb 12/24/2018   Pain in femur 12/24/2018   TIA (transient ischemic attack)  11/18/2018   Cortical age-related cataract of both eyes 11/02/2018   Fracture of multiple ribs 04/20/2018   Overweight (BMI 25.0-29.9) 04/20/2018   Hypertension 07/08/2017   Diabetes mellitus ([place]) 07/08/2017   Hyperlipidemia 07/08/2017   Tobacco use disorder 07/08/2017   Atherosclerosis of native arteries of extremity with intermittent claudication ([place]) 07/08/2017   SOB (shortness of breath) 06/19/2017   Acute on chronic respiratory failure with hypoxia and hypercapnia (HCC) 12/15/2016   CAP (community acquired pneumonia) 12/15/2016   Chronic pain 12/15/2016   Chronically on opiate therapy 12/15/2016   History of kidney stones 12/15/2016   Polypharmacy 12/15/2016   Somnolence 12/15/2016   Foot ulcer ([place]) 01/19/2016   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 ([place]) 88/10/314   Other psychoactive substance use, unspecified with withdrawal, uncomplicated ([place]) 94/58/5929   Type 2 diabetes mellitus with foot ulcer (CODE) ([place]) 11/27/2015   Chronic obstructive pulmonary disease, unspecified ([place]) 01/18/2013   Depressive disorder 01/18/2013   Hereditary and idiopathic peripheral neuropathy 01/18/2013   Sleep apnea 01/18/2013   Low back pain 11/23/2010   Acute gouty arthropathy 07/10/2010   Problem related to lifestyle 10/29/2004   PCP:  Remi Haggard, FNP Pharmacy:   [place], [place] [address] Phone: [phone] Fax: [phone]     Social Determinants of Health (SDOH) Interventions    Readmission Risk Interventions Readmission Risk Prevention Plan 08/03/2021 11/15/2020  Transportation Screening Complete Complete  PCP or Specialist Appt within 3-5 Days - Not Complete  Not Complete comments - Pending discharge, unit clerk will schedule.  [place] or [place] - Complete  Social Work Consult for Recovery Care Planning/Counseling - Complete  Palliative Care Screening - Not Applicable  Medication Review Designer, fashion/clothing) Complete Complete  PCP or Specialist appointment within 3-5 days of discharge Complete -  [place] or Home Care Consult Complete -  SW Recovery Care/Counseling Consult Complete -  Palliative Care Screening Not Applicable -  Skilled Nursing Facility Complete -  Some recent data might be hidden

## 2021-08-05 NOTE — ED Triage Notes (Signed)
Pt BIBA for RESP distress, Pt noted to be 60% on RA by fire department - Pt arrives with duoneb infusing at 76%.    Pt was discharged yesterday.  Given 1 albuterol svn, 2 duonebs, 125 solumedrol given IM.   Pt immediately placed on BiPAP on arrival.

## 2021-08-05 NOTE — ED Notes (Signed)
Son Raquel Sarna) updated on pt status and admission status.

## 2021-08-05 NOTE — ED Notes (Signed)
Pt up to edge of bed eating dinner with daughter. Pt denies needs. No further needs noted. Call bell in reach.

## 2021-08-05 NOTE — ED Notes (Signed)
Pt resting at this time with BiPap in place. Pt does have a increased RR. Pt denies needs. Call bell in reach.

## 2021-08-05 NOTE — ED Provider Notes (Signed)
[facility] Emergency Department Provider Note   ____________________________________________   Event Date/Time   First MD Initiated Contact with Patient 08/05/21 [id number]     (approximate)  I have reviewed the triage vital signs and the nursing notes.   HISTORY  Chief Complaint Shortness of Breath    HPI Christian Fields is a 65 y.o. male with past medical history of hypertension, hyperlipidemia, diabetes, COPD on 3 L, and chronic pain syndrome who presents to the ED complaining of shortness of breath.  History is limited due to patient's respiratory distress.  He reportedly has been having increasing difficulty breathing over the past 24 hours, following discharge from the hospital yesterday.  Patient was found in respiratory distress by EMS, given IM Solu-Medrol along with 2 duo nebs with partial improvement in his symptoms.  Patient endorses cough and shortness of breath, denies fever or chest pain.        Past Medical History:  Diagnosis Date   Anemia    Anxiety    Arthritis    Asthma    Cancer ([place])    Basal Cell Skin Cancer   Chronic back pain    COPD (chronic obstructive pulmonary disease) (HCC)    Depression    Diabetes mellitus (HCC)    Dyspnea    GERD (gastroesophageal reflux disease)    Gout    Gout    Headache    History of blood clots    Left Leg--July 2018   History of kidney stones    Hyperlipidemia    Hyperlipidemia    Hypertension    Kidney stones    Neuropathy    On home oxygen therapy    2 L / M   Pneumonia 06/2017   Sleep apnea    Ulcer of foot ([place])    Right    Patient Active Problem List   Diagnosis Date Noted   COPD exacerbation ([place]) 08/02/2021   Acute on chronic respiratory failure with hypoxia ([place]) 03/08/2021   Septic shock ([place]) 02/26/2021   Polysubstance (excluding opioids) dependence ([place]) 25/00/3704   Acute metabolic encephalopathy 88/89/1694   COPD with acute exacerbation ([place]) 01/29/2021   Iron  deficiency anemia 11/27/2020   Severe sepsis (HCC)    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   Left leg swelling 04/28/2020   Confusion    Acute respiratory failure with hypoxia ([place]) 03/26/2020   Pneumonia of both lower lobes due to infectious organism 03/25/2020   AKI (acute kidney injury) ([place]) 01/13/2020   Cellulitis of left leg 01/12/2020   COVID-19 virus detected 01/12/2020   Diabetic ulcer of left midfoot associated with type 2 diabetes mellitus, with fat layer exposed ([place]) 01/12/2020   Panlobular emphysema ([place]) 01/12/2020   Arthritis 10/29/2019   Hemorrhoids 10/29/2019   Senile nuclear sclerosis, bilateral 02/15/2019   Cellulitis of left upper limb 02/11/2019   AAA (abdominal aortic aneurysm) without rupture ([place]) 01/15/2019   Localized swelling, mass and lump, upper limb 12/24/2018   Pain in femur 12/24/2018   TIA (transient ischemic attack) 11/18/2018   Cortical age-related cataract of both eyes 11/02/2018   Fracture of multiple ribs 04/20/2018   Overweight (BMI 25.0-29.9) 04/20/2018   Hypertension 07/08/2017   Diabetes mellitus ([place]) 07/08/2017   Hyperlipidemia 07/08/2017   Tobacco use disorder 07/08/2017   Atherosclerosis of native arteries of extremity with intermittent claudication ([place]) 07/08/2017   SOB (shortness of breath) 06/19/2017   Acute on chronic respiratory failure with hypoxia and hypercapnia ([place])  12/15/2016   CAP (community acquired pneumonia) 12/15/2016   Chronic pain 12/15/2016   Chronically on opiate therapy 12/15/2016   History of kidney stones 12/15/2016   Polypharmacy 12/15/2016   Somnolence 12/15/2016   Foot ulcer ([place]) 01/19/2016   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 ([place]) 22/97/9892   Other psychoactive substance use, unspecified with withdrawal, uncomplicated ([place]) 11/94/1740   Type 2 diabetes mellitus with foot ulcer (CODE) ([place]) 11/27/2015   Chronic obstructive pulmonary disease, unspecified ([place]) 01/18/2013   Depressive disorder  01/18/2013   Hereditary and idiopathic peripheral neuropathy 01/18/2013   Sleep apnea 01/18/2013   Low back pain 11/23/2010   Acute gouty arthropathy 07/10/2010   Problem related to lifestyle 10/29/2004    Past Surgical History:  Procedure Laterality Date   APPENDECTOMY     DG FEET 2 VIEWS BILAT     LIPOMA EXCISION Right 08/15/2017   Procedure: EXCISION TUMOR(CYST) FOOT;  Surgeon: Albertine Patricia, DPM;  Location: [facility] ORS;  Service: Podiatry;  Laterality: Right;   OTHER SURGICAL HISTORY Bilateral Foot surgery    Prior to Admission medications   Medication Sig Start Date End Date Taking? Authorizing Provider  albuterol (VENTOLIN HFA) 108 (90 Base) MCG/ACT inhaler Inhale 2 puffs into the lungs every 4 (four) hours as needed for wheezing or shortness of breath.    [provider]  allopurinol (ZYLOPRIM) 300 MG tablet Take 600 mg by mouth daily. 02/24/20   [provider]  aspirin EC 81 MG tablet Take 1 tablet by mouth daily.    [provider]  buprenorphine-naloxone (SUBOXONE) 8-2 mg SUBL SL tablet Place 1 tablet under the tongue in the morning, at noon, and at bedtime.    [provider]  cholecalciferol (VITAMIN D) 1000 units tablet Take 1,000 Units by mouth daily.    [provider]  clopidogrel (PLAVIX) 75 MG tablet Take 1 tablet (75 mg total) by mouth daily. 02/02/21   Shawna Clamp, MD  doxycycline (VIBRA-TABS) 100 MG tablet Take 1 tablet (100 mg total) by mouth every 12 (twelve) hours for 2 days. 08/04/21 08/06/21  Little Ishikawa, MD  DULoxetine (CYMBALTA) 30 MG capsule Take 1 capsule by mouth daily. 01/17/16   [provider]  ezetimibe (ZETIA) 10 MG tablet Take 10 mg by mouth daily. 02/24/20   [provider]  famotidine (PEPCID) 20 MG tablet Take 1 tablet (20 mg total) by mouth 2 (two) times daily. 03/09/21 04/08/21  Val Riles, MD  FARXIGA 10 MG TABS tablet Take 10 mg by mouth daily. 09/14/20   [provider]   fluticasone furoate-vilanterol (BREO ELLIPTA) 100-25 MCG/INH AEPB Inhale 1 puff into the lungs daily for 30 doses. 08/05/21 09/04/21  Little Ishikawa, MD  gabapentin (NEURONTIN) 800 MG tablet Take 800 mg by mouth 4 (four) times daily. 07/14/21   [provider]  KOMBIGLYZE XR 2.04-999 MG TB24 Take 2 tablets by mouth daily. 01/17/16   [provider]  metoprolol succinate (TOPROL-XL) 50 MG 24 hr tablet Take 50 mg by mouth daily. 02/24/20   [provider]  OXYGEN Inhale 2 L into the lungs.    [provider]  pantoprazole (PROTONIX) 40 MG tablet Take 40 mg by mouth daily. 06/20/21   [provider]  predniSONE (DELTASONE) 10 MG tablet Take 4 tablets (40 mg total) by mouth daily for 3 days, THEN 3 tablets (30 mg total) daily for 3 days, THEN 2 tablets (20 mg total) daily for 3 days, THEN 1 tablet (10  mg total) daily for 3 days. 08/04/21 08/16/21  Little Ishikawa, MD  simvastatin (ZOCOR) 40 MG tablet Take 1 tablet by mouth daily. 01/17/16   [provider]  umeclidinium bromide (INCRUSE ELLIPTA) 62.5 MCG/INH AEPB Inhale 1 puff into the lungs daily. 08/05/21 09/04/21  Little Ishikawa, MD    Allergies Bee venom  Family History  Problem Relation Age of Onset   Hypertension Mother     Social History Social History   Tobacco Use   Smoking status: Former    Packs/day: 0.50    Types: Cigarettes    Start date: 2018   Smokeless tobacco: Never  Vaping Use   Vaping Use: Never used  Substance Use Topics   Alcohol use: Yes    Alcohol/week: 1.0 standard drink    Types: 1 Cans of beer per week    Comment: occ   Drug use: No    Review of Systems  Constitutional: No fever/chills Eyes: No visual changes. ENT: No sore throat. Cardiovascular: Denies chest pain. Respiratory: Positive for cough and shortness of breath. Gastrointestinal: No abdominal pain.  No nausea, no vomiting.  No diarrhea.  No constipation. Genitourinary: Negative for  dysuria. Musculoskeletal: Negative for back pain. Skin: Negative for rash. Neurological: Negative for headaches, focal weakness or numbness.  ____________________________________________   PHYSICAL EXAM:  VITAL SIGNS: ED Triage Vitals [08/05/21 0636]  Enc Vitals Group     BP      Pulse      Resp      Temp      Temp src      SpO2 (!) 76 %     Weight      Height      Head Circumference      Peak Flow      Pain Score      Pain Loc      Pain Edu?      Excl. in [place]?     Constitutional: Alert and oriented. Eyes: Conjunctivae are normal. Head: Atraumatic. Nose: No congestion/rhinnorhea. Mouth/Throat: Mucous membranes are moist. Neck: Normal ROM Cardiovascular: Tachycardic, regular rhythm. Grossly normal heart sounds.  2+ radial pulses bilaterally. Respiratory: Tachypneic with increased respiratory effort and subcostal retractions.  Very poor air movement with faint end expiratory wheezing noted. Gastrointestinal: Soft and nontender. No distention. Genitourinary: deferred Musculoskeletal: No lower extremity tenderness nor edema. Neurologic:  Normal speech and language. No gross focal neurologic deficits are appreciated. Skin:  Skin is warm, dry and intact. No rash noted. Psychiatric: Mood and affect are normal. Speech and behavior are normal.  ____________________________________________   LABS (all labs ordered are listed, but only abnormal results are displayed)  Labs Reviewed  RESP PANEL BY RT-PCR (FLU A&B, COVID) ARPGX2  CBC WITH DIFFERENTIAL/PLATELET  BASIC METABOLIC PANEL  BRAIN NATRIURETIC PEPTIDE  BLOOD GAS, VENOUS  TROPONIN I (HIGH SENSITIVITY)   ____________________________________________  EKG  ED ECG REPORT I, Blake Divine, the attending physician, personally viewed and interpreted this ECG.   Date: 08/05/2021  EKG Time: 6:34  Rate: 122  Rhythm: sinus tachycardia  Axis: Normal  Intervals:none  ST&T Change:  None    PROCEDURES  Procedure(s) performed (including Critical Care):  .Critical Care  Date/Time: 08/05/2021 6:40 AM Performed by: Blake Divine, MD Authorized by: Blake Divine, MD   Critical care provider statement:    Critical care time (minutes):  45   Critical care time was exclusive of:  Separately billable procedures and treating other patients and teaching time  Critical care was necessary to treat or prevent imminent or life-threatening deterioration of the following conditions:  Respiratory failure   Critical care was time spent personally by me on the following activities:  Discussions with consultants, evaluation of patient's response to treatment, examination of patient, ordering and performing treatments and interventions, ordering and review of laboratory studies, ordering and review of radiographic studies, pulse oximetry, re-evaluation of patient's condition, obtaining history from patient or surrogate and review of old charts   I assumed direction of critical care for this patient from another provider in my specialty: no     Care discussed with: admitting provider     ____________________________________________   INITIAL IMPRESSION / ASSESSMENT AND PLAN / ED COURSE      65 year old male with past medical history of hypertension, hyperlipidemia, diabetes, COPD on 3 L, and chronic pain syndrome who presents to the ED with 24 hours of increasing difficulty breathing, noted to be in respiratory distress by EMS.  Patient noted to have O2 sats in the mid 70s on his usual 3 L nasal cannula, was transitioned to BiPAP immediately after arrival to the ED.  Work of breathing seems to be improved following transition to BiPAP, patient moving very little air despite breathing treatments given by EMS, we will give additional DuoNeb along with IV magnesium.  He was given steroids prior to arrival.  Patient turned over to oncoming provider pending labs and chest x-ray.   Anticipate admission for acute on chronic hypoxic respiratory failure.      ____________________________________________   FINAL CLINICAL IMPRESSION(S) / ED DIAGNOSES  Final diagnoses:  COPD exacerbation ([place])  Acute on chronic respiratory failure with hypoxia [facility])     ED Discharge Orders     None        Note:  This document was prepared using Dragon voice recognition software and may include unintentional dictation errors.    Blake Divine, MD 08/05/21 [phone]

## 2021-08-05 NOTE — H&P (Signed)
History and Physical    Christian Fields FAO:[id number] DOB: 1956-08-06 DOA: 08/05/2021  Referring MD/NP/PA:   PCP: Remi Haggard, FNP   Patient coming from:  The patient is coming from home.  At baseline, pt is independent for most of ADL.        Chief Complaint: Shortness of breath  HPI: AAHIL Fields is a 65 y.o. male with medical history significant of COPD on 2 L oxygen, hypertension, hyperlipidemia, diabetes mellitus, asthma, TIA, GERD, gout, depression, foot ulcer, OSA on CPAP, anemia, AAA, polysubstance abuse, non-small cell lung cancer (s/p of RXT), tobacco abuse, CKD-3A, who presents with shortness of breath  Patient was recently hospitalized from 8/25-8/27 due to COPD exacerbation.  Patient states that after he went home, he continues to have short breath, which has been progressively worsening.  He has cough with little mucus production.  No chest pain, fever or chills.  Denies nausea, vomiting, diarrhea or abdominal pain.  No symptoms of UTI.   Patient was found to have severe respiratory distress, with oxygen saturation to 60s on home level oxygen, BiPAP was started in ED.  ED Course: pt was found to have WBC 27.1, BNP 798.1, troponin level 17, negative COVID PCR, AKI with creatinine 1.73, BUN 36 (baseline creatinine 1.01 on 08/04/2021), temperature 99.5, blood pressure 132/87, heart rate 117, RR 38, chest x-ray showed right lower lobe infiltration.  VBG with pH 7.21, CO2 78, O2 54. Patient is admitted to progressive bed as inpatient.  Review of Systems:   General: no fevers, chills, no body weight gain, has fatigue HEENT: no blurry vision, hearing changes or sore throat Respiratory: has dyspnea, coughing, wheezing CV: no chest pain, no palpitations GI: no nausea, vomiting, abdominal pain, diarrhea, constipation GU: no dysuria, burning on urination, increased urinary frequency, hematuria  Ext: has mild leg edema Neuro: no unilateral weakness, numbness, or tingling, no  vision change or hearing loss Skin: no rash, no skin tear. MSK: No muscle spasm, no deformity, no limitation of range of movement in spin Heme: No easy bruising.  Travel history: No recent long distant travel.  Allergy:  Allergies  Allergen Reactions   Bee Venom Anaphylaxis    Past Medical History:  Diagnosis Date   Anemia    Anxiety    Arthritis    Asthma    Cancer ([place])    Basal Cell Skin Cancer   Chronic back pain    COPD (chronic obstructive pulmonary disease) (HCC)    Depression    Diabetes mellitus (HCC)    Dyspnea    GERD (gastroesophageal reflux disease)    Gout    Gout    Headache    History of blood clots    Left Leg--July 2018   History of kidney stones    Hyperlipidemia    Hyperlipidemia    Hypertension    Kidney stones    Neuropathy    On home oxygen therapy    2 L / M   Pneumonia 06/2017   Sleep apnea    Ulcer of foot ([place])    Right    Past Surgical History:  Procedure Laterality Date   APPENDECTOMY     DG FEET 2 VIEWS BILAT     LIPOMA EXCISION Right 08/15/2017   Procedure: EXCISION TUMOR(CYST) FOOT;  Surgeon: Albertine Patricia, DPM;  Location: [facility] ORS;  Service: Podiatry;  Laterality: Right;   OTHER SURGICAL HISTORY Bilateral Foot surgery    Social History:  reports that he  has quit smoking. His smoking use included cigarettes. He started smoking about 4 years ago. He smoked an average of .5 packs per day. He has never used smokeless tobacco. He reports current alcohol use of about 1.0 standard drink per week. He reports that he does not use drugs.  Family History:  Family History  Problem Relation Age of Onset   Hypertension Mother      Prior to Admission medications   Medication Sig Start Date End Date Taking? Authorizing Provider  albuterol (VENTOLIN HFA) 108 (90 Base) MCG/ACT inhaler Inhale 2 puffs into the lungs every 4 (four) hours as needed for wheezing or shortness of breath.   Yes [provider]  allopurinol (ZYLOPRIM)  300 MG tablet Take 600 mg by mouth daily. 02/24/20  Yes [provider]  aspirin EC 81 MG tablet Take 1 tablet by mouth daily.   Yes [provider]  buprenorphine-naloxone (SUBOXONE) 8-2 mg SUBL SL tablet Place 1 tablet under the tongue in the morning, at noon, and at bedtime.   Yes [provider]  cholecalciferol (VITAMIN D) 1000 units tablet Take 1,000 Units by mouth daily.   Yes [provider]  clopidogrel (PLAVIX) 75 MG tablet Take 1 tablet (75 mg total) by mouth daily. 02/02/21  Yes Shawna Clamp, MD  doxycycline (VIBRA-TABS) 100 MG tablet Take 1 tablet (100 mg total) by mouth every 12 (twelve) hours for 2 days. 08/04/21 08/06/21 Yes Little Ishikawa, MD  DULoxetine (CYMBALTA) 30 MG capsule Take 1 capsule by mouth daily. 01/17/16  Yes [provider]  ezetimibe (ZETIA) 10 MG tablet Take 10 mg by mouth daily. 02/24/20  Yes [provider]  famotidine (PEPCID) 20 MG tablet Take 1 tablet (20 mg total) by mouth 2 (two) times daily. 03/09/21 08/05/21 Yes Val Riles, MD  FARXIGA 10 MG TABS tablet Take 10 mg by mouth daily. 09/14/20  Yes [provider]  fluticasone furoate-vilanterol (BREO ELLIPTA) 100-25 MCG/INH AEPB Inhale 1 puff into the lungs daily for 30 doses. 08/05/21 09/04/21 Yes Little Ishikawa, MD  gabapentin (NEURONTIN) 800 MG tablet Take 800 mg by mouth 4 (four) times daily. 07/14/21  Yes [provider]  KOMBIGLYZE XR 2.04-999 MG TB24 Take 2 tablets by mouth daily. 01/17/16  Yes [provider]  metoprolol succinate (TOPROL-XL) 50 MG 24 hr tablet Take 50 mg by mouth daily. 02/24/20  Yes [provider]  OXYGEN Inhale 2 L into the lungs.   Yes [provider]  pantoprazole (PROTONIX) 40 MG tablet Take 40 mg by mouth daily. 06/20/21  Yes [provider]  predniSONE (DELTASONE) 10 MG tablet Take 4 tablets (40 mg total) by mouth daily for 3 days, THEN 3 tablets (30 mg total) daily for 3  days, THEN 2 tablets (20 mg total) daily for 3 days, THEN 1 tablet (10 mg total) daily for 3 days. 08/04/21 08/16/21 Yes Little Ishikawa, MD  simvastatin (ZOCOR) 40 MG tablet Take 1 tablet by mouth daily. 01/17/16  Yes [provider]  umeclidinium bromide (INCRUSE ELLIPTA) 62.5 MCG/INH AEPB Inhale 1 puff into the lungs daily. 08/05/21 09/04/21 Yes Little Ishikawa, MD    Physical Exam: Vitals:   08/05/21 1315 08/05/21 1615 08/05/21 1630 08/05/21 1730  BP: (!) 143/93 (!) 148/96 (!) 146/85 (!) 157/91  Pulse: 99 90 94 90  Resp: 18 (!) 23 16 18   Temp:      TempSrc:      SpO2: 100% 100% 94% 96%  General: in no acute respiratory distress HEENT:       Eyes: PERRL, EOMI, no scleral icterus.       ENT: No discharge from the ears and nose, no pharynx injection, no tonsillar enlargement.        Neck: No JVD, no bruit, no mass felt. Heme: No neck lymph node enlargement. Cardiac: S1/S2, RRR, No murmurs, No gallops or rubs. Respiratory: Has decreased air movement on left side, has mild wheezing on the right side GI: Soft, nondistended, nontender, no rebound pain, no organomegaly, BS present. GU: No hematuria Ext: has trace leg edema bilaterally. 1+DP/PT pulse bilaterally. Musculoskeletal: No joint deformities, No joint redness or warmth, no limitation of ROM in spin. Skin: No rashes.  Neuro: Alert, oriented X3, cranial nerves II-XII grossly intact, moves all extremities normally.  Psych: Patient is not psychotic, no suicidal or hemocidal ideation.  Labs on Admission: I have personally reviewed following labs and imaging studies  CBC: Recent Labs  Lab 08/02/21 1244 08/03/21 0656 08/04/21 0509 08/05/21 0639  WBC 9.6 3.8* 10.3 27.1*  NEUTROABS 7.2  --   --  23.4*  HGB 10.8* 10.8* 10.2* 12.5*  HCT 34.0* 32.6* 32.7* 39.7  MCV 97.1 96.2 95.6 99.0  PLT 198 200 187 170   Basic Metabolic Panel: Recent Labs  Lab 08/02/21 1244 08/03/21 0656 08/04/21 0509 08/05/21 0639  NA  137 139 136 140  K 4.3 4.1 3.8 3.5  CL 100 101 99 98  CO2 28 30 30 30   GLUCOSE 89 164* 234* 156*  BUN 19 19 25* 36*  CREATININE 1.11 1.02 1.01 1.73*  CALCIUM 8.1* 8.1* 8.1* 8.0*   GFR: Estimated Creatinine Clearance: 40.6 mL/min (A) (by C-G formula based on SCr of 1.73 mg/dL (H)). Liver Function Tests: Recent Labs  Lab 08/03/21 0656  AST 14*  ALT 9  ALKPHOS 104  BILITOT 0.8  PROT 7.1  ALBUMIN 3.2*   No results for input(s): LIPASE, AMYLASE in the last 168 hours. No results for input(s): AMMONIA in the last 168 hours. Coagulation Profile: No results for input(s): INR, PROTIME in the last 168 hours. Cardiac Enzymes: No results for input(s): CKTOTAL, CKMB, CKMBINDEX, TROPONINI in the last 168 hours. BNP (last 3 results) No results for input(s): PROBNP in the last 8760 hours. HbA1C: No results for input(s): HGBA1C in the last 72 hours.  CBG: Recent Labs  Lab 08/03/21 1141 08/03/21 1639 08/03/21 2037 08/04/21 0820 08/04/21 1141  GLUCAP 190* 193* 323* 144* 127*   Lipid Profile: No results for input(s): CHOL, HDL, LDLCALC, TRIG, CHOLHDL, LDLDIRECT in the last 72 hours. Thyroid Function Tests: No results for input(s): TSH, T4TOTAL, FREET4, T3FREE, THYROIDAB in the last 72 hours. Anemia Panel: No results for input(s): VITAMINB12, FOLATE, FERRITIN, TIBC, IRON, RETICCTPCT in the last 72 hours. Urine analysis:    Component Value Date/Time   COLORURINE YELLOW (A) 02/27/2021 0330   APPEARANCEUR HAZY (A) 02/27/2021 0330   APPEARANCEUR Clear 03/24/2013 1853   LABSPEC 1.017 02/27/2021 0330   LABSPEC 1.020 03/24/2013 1853   PHURINE 5.0 02/27/2021 0330   GLUCOSEU 150 (A) 02/27/2021 0330   GLUCOSEU Negative 03/24/2013 1853   HGBUR LARGE (A) 02/27/2021 0330   BILIRUBINUR NEGATIVE 02/27/2021 0330   BILIRUBINUR Negative 03/24/2013 1853   KETONESUR NEGATIVE 02/27/2021 0330   PROTEINUR 30 (A) 02/27/2021 0330   NITRITE NEGATIVE 02/27/2021 0330   LEUKOCYTESUR NEGATIVE  02/27/2021 0330   LEUKOCYTESUR Negative 03/24/2013 1853   Sepsis Labs: @LABRCNTIP (procalcitonin:4,lacticidven:4) ) Recent Results (from  the past 240 hour(s))  Resp Panel by RT-PCR (Flu A&B, Covid) Nasopharyngeal Swab     Status: None   Collection Time: 08/02/21  4:17 PM   Specimen: Nasopharyngeal Swab; Nasopharyngeal(NP) swabs in vial transport medium  Result Value Ref Range Status   SARS Coronavirus 2 by RT PCR NEGATIVE NEGATIVE Final    Comment: (NOTE) SARS-CoV-2 target nucleic acids are NOT DETECTED.  The SARS-CoV-2 RNA is generally detectable in upper respiratory specimens during the acute phase of infection. The lowest concentration of SARS-CoV-2 viral copies this assay can detect is 138 copies/mL. A negative result does not preclude SARS-Cov-2 infection and should not be used as the sole basis for treatment or other patient management decisions. A negative result may occur with  improper specimen collection/handling, submission of specimen other than nasopharyngeal swab, presence of viral mutation(s) within the areas targeted by this assay, and inadequate number of viral copies(<138 copies/mL). A negative result must be combined with clinical observations, patient history, and epidemiological information. The expected result is Negative.  Fact Sheet for Patients:  EntrepreneurPulse.com.au  Fact Sheet for Healthcare Providers:  IncredibleEmployment.be  This test is no t yet approved or cleared by the [place] FDA and  has been authorized for detection and/or diagnosis of SARS-CoV-2 by FDA under an Emergency Use Authorization (EUA). This EUA will remain  in effect (meaning this test can be used) for the duration of the COVID-19 declaration under Section 564(b)(1) of the Act, 21 U.S.C.section 360bbb-3(b)(1), unless the authorization is terminated  or revoked sooner.       Influenza A by PCR NEGATIVE NEGATIVE Final   Influenza B  by PCR NEGATIVE NEGATIVE Final    Comment: (NOTE) The Xpert Xpress SARS-CoV-2/FLU/RSV plus assay is intended as an aid in the diagnosis of influenza from Nasopharyngeal swab specimens and should not be used as a sole basis for treatment. Nasal washings and aspirates are unacceptable for Xpert Xpress SARS-CoV-2/FLU/RSV testing.  Fact Sheet for Patients: EntrepreneurPulse.com.au  Fact Sheet for Healthcare Providers: IncredibleEmployment.be  This test is not yet approved or cleared by the [place] FDA and has been authorized for detection and/or diagnosis of SARS-CoV-2 by FDA under an Emergency Use Authorization (EUA). This EUA will remain in effect (meaning this test can be used) for the duration of the COVID-19 declaration under Section 564(b)(1) of the Act, 21 U.S.C. section 360bbb-3(b)(1), unless the authorization is terminated or revoked.  Performed at [facility], [address] [place], [place] [address]   Resp Panel by RT-PCR (Flu A&B, Covid) Nasopharyngeal Swab     Status: None   Collection Time: 08/05/21  6:39 AM   Specimen: Nasopharyngeal Swab; Nasopharyngeal(NP) swabs in vial transport medium  Result Value Ref Range Status   SARS Coronavirus 2 by RT PCR NEGATIVE NEGATIVE Final    Comment: (NOTE) SARS-CoV-2 target nucleic acids are NOT DETECTED.  The SARS-CoV-2 RNA is generally detectable in upper respiratory specimens during the acute phase of infection. The lowest concentration of SARS-CoV-2 viral copies this assay can detect is 138 copies/mL. A negative result does not preclude SARS-Cov-2 infection and should not be used as the sole basis for treatment or other patient management decisions. A negative result may occur with  improper specimen collection/handling, submission of specimen other than nasopharyngeal swab, presence of viral mutation(s) within the areas targeted by this assay, and inadequate number of  viral copies(<138 copies/mL). A negative result must be combined with clinical observations, patient history, and epidemiological information. The expected  result is Negative.  Fact Sheet for Patients:  EntrepreneurPulse.com.au  Fact Sheet for Healthcare Providers:  IncredibleEmployment.be  This test is no t yet approved or cleared by the [place] FDA and  has been authorized for detection and/or diagnosis of SARS-CoV-2 by FDA under an Emergency Use Authorization (EUA). This EUA will remain  in effect (meaning this test can be used) for the duration of the COVID-19 declaration under Section 564(b)(1) of the Act, 21 U.S.C.section 360bbb-3(b)(1), unless the authorization is terminated  or revoked sooner.       Influenza A by PCR NEGATIVE NEGATIVE Final   Influenza B by PCR NEGATIVE NEGATIVE Final    Comment: (NOTE) The Xpert Xpress SARS-CoV-2/FLU/RSV plus assay is intended as an aid in the diagnosis of influenza from Nasopharyngeal swab specimens and should not be used as a sole basis for treatment. Nasal washings and aspirates are unacceptable for Xpert Xpress SARS-CoV-2/FLU/RSV testing.  Fact Sheet for Patients: EntrepreneurPulse.com.au  Fact Sheet for Healthcare Providers: IncredibleEmployment.be  This test is not yet approved or cleared by the [place] FDA and has been authorized for detection and/or diagnosis of SARS-CoV-2 by FDA under an Emergency Use Authorization (EUA). This EUA will remain in effect (meaning this test can be used) for the duration of the COVID-19 declaration under Section 564(b)(1) of the Act, 21 U.S.C. section 360bbb-3(b)(1), unless the authorization is terminated or revoked.  Performed at [facility], [address] [place], [place] [address]      Radiological Exams on Admission: DG Chest Portable 1 View  Result Date: 08/05/2021 CLINICAL DATA:   65 year old male with shortness of breath. Nonproductive cough. Recently hospitalized. Left upper lung mass. EXAM: PORTABLE CHEST 1 VIEW COMPARISON:  Chest CTA 08/02/2021 and earlier. FINDINGS: Portable AP semi upright view at 0645 hours. Increased and confluent patchy opacity throughout the right lower lung since 08/02/2021. No superimposed pneumothorax or pleural effusion. Left upper lobe roughly 3 cm spiculated lung nodule/mass redemonstrated as described on the recent CTA. Mediastinal contours are stable. Visualized tracheal air column is within normal limits. No acute osseous abnormality identified. IMPRESSION: 1. Progressive and confluent right lower lung opacity compatible with Acute Pneumonia. 2. Spiculated Left Upper Lobe Mass with underlying Emphysema (ICD10-J43.9). See further discussion on recent Chest CTA (Impression #2) Electronically Signed   By: Genevie Ann M.D.   On: 08/05/2021 06:58     EKG: I have personally reviewed.  Sinus rhythm, tachycardia, QTC 453, LAD, poor R wave progression, anteroseptal infarction pattern, PAC.   Assessment/Plan Principal Problem:   Acute on chronic respiratory failure with hypoxia and hypercapnia (HCC) Active Problems:   Hypertension   Hyperlipidemia   Sleep apnea   TIA (transient ischemic attack)   Type 2 diabetes mellitus with foot ulcer (CODE) ([place])   AKI (acute kidney injury) ([place])   Polysubstance (excluding opioids) dependence (HCC)   COPD exacerbation (HCC)   Sepsis (HCC)   Gout   Depression   Chronic diastolic CHF (congestive heart failure) ([place])   HCAP (healthcare-associated pneumonia)   Acute on chronic respiratory failure with hypoxia and hypercapnia and sepsis due to COPD exacerbation and HCAP (healthcare-associated pneumonia): Patient meets criteria for sepsis with WBC 27.1, tachycardia with heart rate 117, tachypnea with RR 38, lactic acid is 1.6, currently hemodynamically stable.  -Admitted to progressive unit as inpatient - IV  Vancomycin and cefepime (patient received 1 dose of Zosyn in ED) -On BiPAP - Mucinex for cough  - Bronchodilators - Urine legionella and S.  pneumococcal antigen - Follow up blood culture x2, sputum culture  - will get Procalcitonin --> 0.89 - will not give IVF due normal lactic acid level and elevated BNP 798  Hypertension -IV hydralazine as needed -Metoprolol  Hyperlipidemia -Zocor and Zetia  Sleep apnea -CPAP when off BiPAP  TIA (transient ischemic attack) -Aspirin, Zocor, Zetia, Plavix  Type 2 diabetes mellitus with foot ulcer (CODE) ([place]): Recent A1c 5.6, well controlled.  Patient taking [place] -Sliding scale insulin  AKI (acute kidney injury) [facility]): Recent baseline creatinine 1.01 on 08/04/2021, his creatinine is 1.73, BUN 36 today.  Likely due to dehydration. -Avoid using renal toxic medications, -will not give IV fluid since patient already has elevated BNP 798 -check UA  Polysubstance (excluding opioids) dependence (HCC) -Check UDS  Gout -Allopurinol  Depression -Cymbalta  Chronic diastolic CHF (congestive heart failure) ([place]): 2D echo on 01/31/2021 showed EF of 55 to 60% with grade 2 diastolic dysfunction.  Patient has elevated BNP 789, but no pulmonary edema on chest x-ray, does not seem to have CHF exacerbation, but patient is at high risk of developing CHF exacerbation. -Watch volume status closely  Chronic pain syndrome: -Continue home Suboxone        DVT ppx: SQ Lovenox Code Status: Full code Family Communication: yes, patient's daughter Disposition Plan:  Anticipate discharge back to previous environment Consults called:  none Admission status and Level of care: Progressive Cardiac:      progressive unit   as inpt        Status is: Inpatient  Remains inpatient appropriate because:Inpatient level of care appropriate due to severity of illness  Dispo: The patient is from: Home              Anticipated d/c is to: Home               Patient currently is not medically stable to d/c.   Difficult to place patient No           Date of Service 08/05/2021    Ivor Costa [facility]   If 7PM-7AM, please contact night-coverage [email or web address] 08/05/2021, 5:50 PM

## 2021-08-05 NOTE — Progress Notes (Signed)
Pharmacy Antibiotic Note  Christian Fields is a 65 y.o. male admitted on 08/05/2021. Patient discharged 8/27 and presented back with shortness of breath. Pharmacy has been consulted for vancomycin and cefepime dosing for pneumonia.  Plan: Vancomycin 1750 mg IV x 1 followed by 1000 mg IV q24hr  Goal AUC 400-550 Expected AUC: 456 SCr used: 1.73  Cefepime 2 g IV q12hr      Temp (24hrs), Avg:98.6 F (37 C), Min:97.7 F (36.5 C), Max:99.5 F (37.5 C)  Recent Labs  Lab 08/02/21 1244 08/03/21 0656 08/04/21 0509 08/05/21 0639  WBC 9.6 3.8* 10.3 27.1*  CREATININE 1.11 1.02 1.01 1.73*    Estimated Creatinine Clearance: 40.6 mL/min (A) (by C-G formula based on SCr of 1.73 mg/dL (H)).    Allergies  Allergen Reactions   Bee Venom Anaphylaxis    Antimicrobials this admission: Zosyn 8/28 x 1 Vancomycin 8/28 >> Cefepime 8/28 >>  Microbiology results: 8/28 BCx: pending 8/28 MRSA PCR: ordered  Thank you for allowing pharmacy to be a part of this patient's care.  Tawnya Crook, PharmD, BCPS Clinical Pharmacist 08/05/2021 8:47 AM

## 2021-08-06 ENCOUNTER — Inpatient Hospital Stay: Payer: Medicare HMO

## 2021-08-06 DIAGNOSIS — J9622 Acute and chronic respiratory failure with hypercapnia: Secondary | ICD-10-CM | POA: Diagnosis not present

## 2021-08-06 DIAGNOSIS — J9621 Acute and chronic respiratory failure with hypoxia: Secondary | ICD-10-CM | POA: Diagnosis not present

## 2021-08-06 LAB — BASIC METABOLIC PANEL
Anion gap: 8 (ref 5–15)
BUN: 33 mg/dL — ABNORMAL HIGH (ref 8–23)
CO2: 30 mmol/L (ref 22–32)
Calcium: 7.5 mg/dL — ABNORMAL LOW (ref 8.9–10.3)
Chloride: 99 mmol/L (ref 98–111)
Creatinine, Ser: 1.17 mg/dL (ref 0.61–1.24)
GFR, Estimated: >60 mL/min (ref >60)
Glucose, Bld: 218 mg/dL — ABNORMAL HIGH (ref 70–99)
Potassium: 3.6 mmol/L (ref 3.5–5.1)
Sodium: 137 mmol/L (ref 135–145)

## 2021-08-06 LAB — CBC
HCT: 30.9 % — ABNORMAL LOW (ref 39.0–52.0)
Hemoglobin: 9.8 g/dL — ABNORMAL LOW (ref 13.0–17.0)
MCH: 30.9 pg (ref 26.0–34.0)
MCHC: 31.7 g/dL (ref 30.0–36.0)
MCV: 97.5 fL (ref 80.0–100.0)
Platelets: 211 10*3/uL (ref 150–400)
RBC: 3.17 MIL/uL — ABNORMAL LOW (ref 4.22–5.81)
RDW: 15.2 % (ref 11.5–15.5)
WBC: 15.4 10*3/uL — ABNORMAL HIGH (ref 4.0–10.5)
nRBC: 0 % (ref 0.0–0.2)

## 2021-08-06 LAB — GLUCOSE, CAPILLARY
Glucose-Capillary: 187 mg/dL — ABNORMAL HIGH (ref 70–99)
Glucose-Capillary: 157 mg/dL — ABNORMAL HIGH (ref 70–99)
Glucose-Capillary: 121 mg/dL — ABNORMAL HIGH (ref 70–99)
Glucose-Capillary: 247 mg/dL — ABNORMAL HIGH (ref 70–99)

## 2021-08-06 LAB — STREP PNEUMONIAE URINARY ANTIGEN: Strep Pneumo Urinary Antigen: NEGATIVE

## 2021-08-06 LAB — MRSA NEXT GEN BY PCR, NASAL: MRSA by PCR Next Gen: NOT DETECTED

## 2021-08-06 IMAGING — DX DG CHEST 1V PORT
1 series · 1 of 1 positions shown · non-contrast
Comparison: Chest x-ray 11/12/2020

CLINICAL DATA: Pneumonia.

EXAM:
PORTABLE CHEST 1 VIEW

[chest ap]
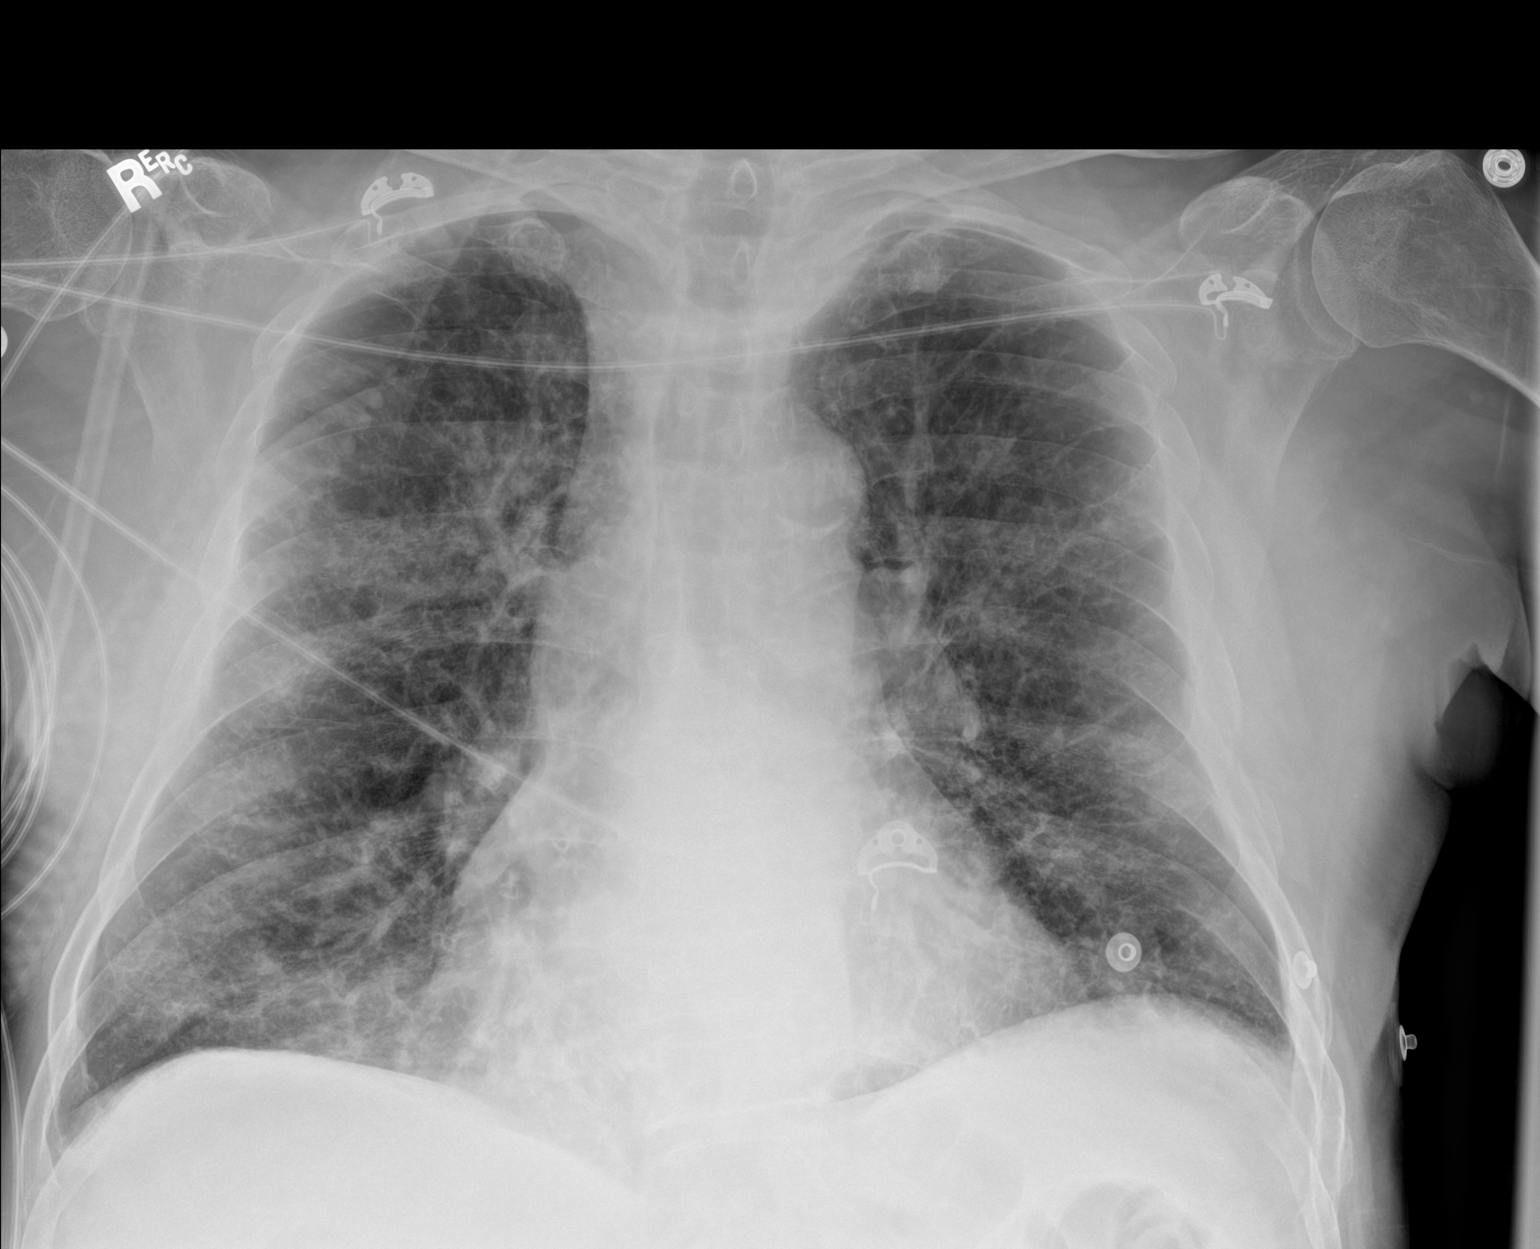

[1 of 1 positions shown; findings below may reference images not displayed]

FINDINGS: The cardiac silhouette, mediastinal and hilar contours are within
normal limits and stable.

Stable underlying emphysematous changes.

Persistent interstitial and airspace process in the lungs. No
pleural effusions.
IMPRESSION: Persistent interstitial and airspace process in the lungs.

## 2021-08-06 MED ORDER — VANCOMYCIN HCL 1500 MG/300ML IV SOLN
1500.0000 mg | INTRAVENOUS | Status: DC
  Administered 2021-08-06: 1500 mg via INTRAVENOUS
  Filled 2021-08-06 (×4): qty 300

## 2021-08-06 MED ORDER — GABAPENTIN 400 MG PO CAPS
800.0000 mg | ORAL_CAPSULE | Freq: Four times a day (QID) | ORAL | Status: DC
  Administered 2021-08-06 – 2021-08-07 (×6): 800 mg via ORAL
  Filled 2021-08-06 (×6): qty 2

## 2021-08-06 MED ORDER — SODIUM CHLORIDE 0.9 % IV SOLN
2.0000 g | Freq: Three times a day (TID) | INTRAVENOUS | Status: DC
  Administered 2021-08-06 – 2021-08-07 (×3): 2 g via INTRAVENOUS
  Filled 2021-08-06 (×6): qty 2

## 2021-08-06 NOTE — Evaluation (Addendum)
Objective Swallowing Evaluation: Type of Study: Bedside Swallow Evaluation   Patient Details  Name: Christian Fields MRN: [id number] Date of Birth: May 08, 1956  Today's Date: 08/06/2021 Time: SLP Start Time (ACUTE ONLY): 65 -SLP Stop Time (ACUTE ONLY): 6812  SLP Time Calculation (min) (ACUTE ONLY): 60 min   Past Medical History:  Past Medical History:  Diagnosis Date   Anemia    Anxiety    Arthritis    Asthma    Cancer ([place])    Basal Cell Skin Cancer   Chronic back pain    COPD (chronic obstructive pulmonary disease) (HCC)    Depression    Diabetes mellitus ([place])    Dyspnea    GERD (gastroesophageal reflux disease)    Gout    Gout    Headache    History of blood clots    Left Leg--July 2018   History of kidney stones    Hyperlipidemia    Hyperlipidemia    Hypertension    Kidney stones    Neuropathy    On home oxygen therapy    2 L / M   Pneumonia 06/2017   Sleep apnea    Ulcer of foot ([place])    Right   Past Surgical History:  Past Surgical History:  Procedure Laterality Date   APPENDECTOMY     DG FEET 2 VIEWS BILAT     LIPOMA EXCISION Right 08/15/2017   Procedure: EXCISION TUMOR(CYST) FOOT;  Surgeon: Albertine Patricia, DPM;  Location: [facility] ORS;  Service: Podiatry;  Laterality: Right;   OTHER SURGICAL HISTORY Bilateral Foot surgery   HPI: Pt is a 65 y.o. male with medical history significant of COPD on 2 L oxygen, hypertension, hyperlipidemia, diabetes mellitus, asthma, TIA, GERD, gout, depression, foot ulcer, OSA on CPAP, anemia, AAA, polysubstance abuse, non-small cell lung cancer (s/p of RXT), tobacco abuse, CKD-3A, who presents with shortness of breath. Pt does have a dx of dysphagia per chart notes and his report that he was seen at [facility](will need to review chart notes).  Patient was recently hospitalized at [facility] from 8/25-8/27 due to COPD exacerbation.  Patient states that after he went home, he continues to have short breath, which has been progressively  worsening.  He has cough with little mucus production.  No chest pain, fever or chills.  Denies nausea, vomiting, diarrhea or abdominal pain.  No symptoms of UTI.  Patient was found to have severe respiratory distress, with oxygen saturation to 60s on home level oxygen, BiPAP was started in ED.  Progressive and confluent right lower lung opacity compatible  with Acute Pneumonia.     2. Spiculated Left Upper Lobe Mass with underlying Emphysema  (ICD10-J43.9). See further discussion on recent Chest CTA.   Subjective: pt awake, verbal. Follows commands w/out difficulty. On [place] O2 support    Assessment / Plan / Recommendation  CHL IP CLINICAL IMPRESSIONS 08/06/2021  Clinical Impression Per this MBS Study today, pt presents with moderate sensorimotor pharyngeal phase dysphagia and as such, he is at a very high risk of aspiration when consuming thin liquids and mild risk of aspiration with nectar thick liquids via cup sips following aspiration precautions. Laryngeal penetration and Aspiration was noted w/ thin liquids; Laryngeal Penetration (trace-min) noted w/ nectar liquids x1/3 trials during this study. Pt appears to present w/ no gross oral phase dysphagia. He is missing few lower molars thus impacting mastication of tougher solids. View of the Cervical Esophagus revealed apparent normal motility.   During the oral phase,  timely bolus management, mastication, and control of bolus propulsion for A-P transfer occurred. Oral clearing achieved w/ all trial consistencies. Pt wears Top Denture plate. During the pharyngeal phase, pt exhibited delayed pharyngeal swallow initiation w/ thin liquid consistency w/ boluses spilling to, and filling, the pyriform sinuses. Laryngeal Penetration and Aspiration occurred both before and during the swallow w/ Laryngeal Penetration being SILENT; a delayed, congested cough noted w/ the Aspirated trials. A chin tuck strategy did not aid in airway protection. W/ trials of nectar  consistency liquid, a more timely pharyngeal swallow initiation was noted w/ inconsistent, trace-min Penetration occurring -- often SILENT. Pt was educated on using a STRONG Cough as a strategy post swallows of liquids. Trials of puree/soft solids triggered a pharyngeal swallow at the level of the Valleculae w/ No penetration/aspiration noted.   Recommend a Dysphagia level 3 (mech soft) diet w/ gravies w/ NECTAR consistency liquids -- pt refused to have his liquids thickened. He stated he declined "this" before "at [facility]". Thorough education was given on aspiration, potential airway compromise including pneumonia, thickening of liquids -- pt again declined Nectar liquids. MD was consulted and stated to leave his diet as is -- a Regluar w/ thin liquids consistency at this time. This SLP recommended Palliative Care f/u at D/C for ongoing education, support. MD agreed. Precautions posted in room. ST services can be available for new needs while admitted. MD agreed; NSG updated.  SLP Visit Diagnosis Dysphagia, pharyngeal phase (R13.13)  Attention and concentration deficit following --  Frontal lobe and executive function deficit following --  Impact on safety and function Moderate aspiration risk;Risk for inadequate nutrition/hydration      CHL IP TREATMENT RECOMMENDATION 08/06/2021  Treatment Recommendations No treatment recommended at this time     Prognosis 08/06/2021  Prognosis for Safe Diet Advancement Guarded  Barriers to Reach Goals Severity of deficits;Time post onset;Behavior;Motivation  Barriers/Prognosis Comment pt declined thickened liquids post completion and education from MBSS today    CHL IP DIET RECOMMENDATION 08/06/2021  SLP Diet Recommendations Regular solids;Thin liquid  Liquid Administration via Cup  Medication Administration Whole meds with puree  Compensations Minimize environmental distractions;Slow rate;Small sips/bites;Clear throat after each swallow;Clear throat  intermittently;Hard cough after swallow  Postural Changes Remain semi-upright after after feeds/meals (Comment);Seated upright at 90 degrees      CHL IP OTHER RECOMMENDATIONS 08/06/2021  Recommended Consults (No Data)  Oral Care Recommendations Oral care BID;Oral care before and after PO;Patient independent with oral care  Other Recommendations (No Data)      CHL IP FOLLOW UP RECOMMENDATIONS 08/06/2021  Follow up Recommendations None      CHL IP FREQUENCY AND DURATION 08/06/2021  Speech Therapy Frequency (ACUTE ONLY) (No Data)  Treatment Duration (No Data)           CHL IP ORAL PHASE 08/06/2021  Oral Phase WFL  Oral - Pudding Teaspoon --  Oral - Pudding Cup --  Oral - Honey Teaspoon --  Oral - Honey Cup NT  Oral - Nectar Teaspoon --  Oral - Nectar Cup 3   Oral - Nectar Straw --  Oral - Thin Teaspoon --  Oral - Thin Cup 6  Oral - Thin Straw --  Oral - Puree 3  Oral - Mech Soft 2  Oral - Regular --  Oral - Multi-Consistency --  Oral - Pill --  Oral Phase - Comment --    CHL IP PHARYNGEAL PHASE 08/06/2021  Pharyngeal Phase Impaired  Pharyngeal- Pudding Teaspoon --  Pharyngeal --  Pharyngeal- Pudding Cup --  Pharyngeal --  Pharyngeal- Honey Teaspoon --  Pharyngeal --  Pharyngeal- Honey Cup NT  Pharyngeal --  Pharyngeal- Nectar Teaspoon --  Pharyngeal --  Pharyngeal- Nectar Cup 3  Pharyngeal --  Pharyngeal- Nectar Straw --  Pharyngeal --  Pharyngeal- Thin Teaspoon --  Pharyngeal --  Pharyngeal- Thin Cup Penetration/Aspiration before swallow;Penetration/Aspiration during swallow;Moderate aspiration;Compensatory strategies attempted (with notebox). 6  Pharyngeal Material enters airway, passes BELOW cords and not ejected out despite cough attempt by patient  Pharyngeal- Thin Straw --  Pharyngeal --  Pharyngeal- Puree 3  Pharyngeal --  Pharyngeal- Mechanical Soft 2  Pharyngeal --  Pharyngeal- Regular --  Pharyngeal --  Pharyngeal- Multi-consistency --   Pharyngeal --  Pharyngeal- Pill --  Pharyngeal --  Pharyngeal Comment --     CHL IP CERVICAL ESOPHAGEAL PHASE 08/06/2021  Cervical Esophageal Phase WFL  Pudding Teaspoon --  Pudding Cup --  Honey Teaspoon --  Honey Cup --  Nectar Teaspoon --  Nectar Cup --  Nectar Straw --  Thin Teaspoon --  Thin Cup --  Thin Straw --  Puree --  Mechanical Soft --  Regular --  Multi-consistency --  Pill --  Cervical Esophageal Comment --           Orinda Kenner, MS, CCC-SLP Speech Language Pathologist Rehab Services [phone] [facility] 08/06/2021, 4:35 PM

## 2021-08-06 NOTE — Consult Note (Addendum)
Pulmonary Medicine          Date: 08/06/2021,   MRN# [id number] Christian Fields 21-Oct-1956     AdmissionWeight: 79.8 kg                 CurrentWeight: 79.5 kg      CHIEF COMPLAINT:   Respiratory failure and pneumonua   HISTORY OF PRESENT ILLNESS   This is a 65 yr olsd male, smoker, opiod dependent, stage IV COPD who was her last week with copd exacerbation,  came back in yesterday with respiratory failure (hypoxia and hypercapnic). Noted to have right infiltrate c/w pneumonia. He is bringing up sputum. No pleurisy, leg pain or frank hemoptysis.    PAST MEDICAL HISTORY   Past Medical History:  Diagnosis Date  . Anemia   . Anxiety   . Arthritis   . Asthma   . Cancer ([place])    Basal Cell Skin Cancer  . Chronic back pain   . COPD (chronic obstructive pulmonary disease) ([place])   . Depression   . Diabetes mellitus ([place])   . Dyspnea   . GERD (gastroesophageal reflux disease)   . Gout   . Gout   . Headache   . History of blood clots    Left Leg--July 2018  . History of kidney stones   . Hyperlipidemia   . Hyperlipidemia   . Hypertension   . Kidney stones   . Neuropathy   . On home oxygen therapy    2 L / M  . Pneumonia 06/2017  . Sleep apnea   . Ulcer of foot ([place])    Right     SURGICAL HISTORY   Past Surgical History:  Procedure Laterality Date  . APPENDECTOMY    . DG FEET 2 VIEWS BILAT    . LIPOMA EXCISION Right 08/15/2017   Procedure: EXCISION TUMOR(CYST) FOOT;  Surgeon: Albertine Patricia, DPM;  Location: [facility] ORS;  Service: Podiatry;  Laterality: Right;  . OTHER SURGICAL HISTORY Bilateral Foot surgery     FAMILY HISTORY   Family History  Problem Relation Age of Onset  . Hypertension Mother      SOCIAL HISTORY   Social History   Tobacco Use  . Smoking status: Former    Packs/day: 0.50    Types: Cigarettes    Start date: 2018  . Smokeless tobacco: Never  Vaping Use  . Vaping Use: Never used  Substance Use Topics  . Alcohol  use: Yes    Alcohol/week: 1.0 standard drink    Types: 1 Cans of beer per week    Comment: occ  . Drug use: No     MEDICATIONS    Home Medication:    Current Medication:  Current Facility-Administered Medications:  .  acetaminophen (TYLENOL) tablet 650 mg, 650 mg, Oral, Q6H PRN, Ivor Costa, MD .  albuterol (PROVENTIL) (2.5 MG/3ML) 0.083% nebulizer solution 2.5 mg, 2.5 mg, Nebulization, Q4H PRN, Ivor Costa, MD .  allopurinol (ZYLOPRIM) tablet 300 mg, 300 mg, Oral, Daily, Ivor Costa, MD, 300 mg at 08/06/21 1010 .  aspirin EC tablet 81 mg, 81 mg, Oral, Daily, Ivor Costa, MD, 81 mg at 08/06/21 1010 .  buprenorphine-naloxone (SUBOXONE) 8-2 mg per SL tablet 1 tablet, 1 tablet, Sublingual, TID, Ivor Costa, MD, 1 tablet at 08/06/21 1009 .  ceFEPIme (MAXIPIME) 2 g in sodium chloride 0.9 % 100 mL IVPB, 2 g, Intravenous, Q8H, Chinita Greenland A, RPH, Last Rate: 200 mL/hr at 08/06/21 1008, 2 g  at 08/06/21 1008 .  cholecalciferol (VITAMIN D3) tablet 1,000 Units, 1,000 Units, Oral, Daily, Ivor Costa, MD, 1,000 Units at 08/06/21 1010 .  clonazePAM (KLONOPIN) tablet 1 mg, 1 mg, Oral, TID PRN, Mansy, Jan A, MD, 1 mg at 08/06/21 5701 .  clopidogrel (PLAVIX) tablet 75 mg, 75 mg, Oral, Daily, Ivor Costa, MD, 75 mg at 08/06/21 1011 .  dextromethorphan-guaiFENesin (MUCINEX DM) 30-600 MG per 12 hr tablet 1 tablet, 1 tablet, Oral, BID PRN, Ivor Costa, MD .  DULoxetine (CYMBALTA) DR capsule 30 mg, 30 mg, Oral, Daily, Ivor Costa, MD, 30 mg at 08/06/21 1010 .  enoxaparin (LOVENOX) injection 40 mg, 40 mg, Subcutaneous, Q24H, Ivor Costa, MD, 40 mg at 08/05/21 2009 .  ezetimibe (ZETIA) tablet 10 mg, 10 mg, Oral, Daily, Ivor Costa, MD, 10 mg at 08/06/21 1009 .  gabapentin (NEURONTIN) capsule 800 mg, 800 mg, Oral, QID, Jake Samples A, RN, 800 mg at 08/06/21 1242 .  insulin aspart (novoLOG) injection 0-5 Units, 0-5 Units, Subcutaneous, QHS, Niu, Xilin, MD .  insulin aspart (novoLOG) injection 0-9 Units, 0-9 Units,  Subcutaneous, TID WC, Ivor Costa, MD, 2 Units at 08/06/21 1242 .  ipratropium-albuterol (DUONEB) 0.5-2.5 (3) MG/3ML nebulizer solution 3 mL, 3 mL, Nebulization, Q4H, Ivor Costa, MD, 3 mL at 08/06/21 1118 .  methylPREDNISolone sodium succinate (SOLU-MEDROL) 40 mg/mL injection 40 mg, 40 mg, Intravenous, Q12H, Ivor Costa, MD, 40 mg at 08/06/21 0420 .  metoprolol succinate (TOPROL-XL) 24 hr tablet 50 mg, 50 mg, Oral, Daily, Ivor Costa, MD, 50 mg at 08/06/21 1011 .  ondansetron (ZOFRAN) injection 4 mg, 4 mg, Intravenous, Q8H PRN, Ivor Costa, MD .  pantoprazole (PROTONIX) EC tablet 40 mg, 40 mg, Oral, Daily, Ivor Costa, MD, 40 mg at 08/06/21 1009 .  simvastatin (ZOCOR) tablet 40 mg, 40 mg, Oral, Daily, Ivor Costa, MD, 40 mg at 08/06/21 1009 .  vancomycin (VANCOREADY) IVPB 1500 mg/300 mL, 1,500 mg, Intravenous, Q24H, Merrill, Kristin A, RPH    ALLERGIES   Bee venom     REVIEW OF SYSTEMS    Review of Systems:  Gen:  Denies  fever, sweats, chills weigh loss  HEENT: Denies blurred vision, double vision, ear pain, eye pain, hearing loss, nose bleeds, sore throat Cardiac:  No dizziness, chest pain or heaviness, chest tightness,edema Resp:   + cough or sputum porduction, s++ hortness of breath, +wheezing, hemoptysis,  Gi: Denies swallowing difficulty, stomach pain, nausea or vomiting, diarrhea, constipation, bowel incontinence Gu:  Denies bladder incontinence, burning urine Ext:   Denies Joint pain, stiffness or swelling Skin: Denies  skin rash, easy bruising or bleeding or hives Endoc:  Denies polyuria, polydipsia , polyphagia or weight change Psych:   Denies depression, insomnia or hallucinations   Other:  All other systems negative   VS: BP 135/83 (BP Location: Right Arm)   Pulse 84   Temp 98 F (36.7 C)   Resp 17   Ht 5\' 8"  (1.727 m)   Wt 79.5 kg   SpO2 98%   BMI 26.65 kg/m      PHYSICAL EXAM    GENERAL:NAD, no fevers, chills, no weakness no fatigue HEAD: Normocephalic,  atraumatic.  EYES: Pupils equal, round, reactive to light. Extraocular muscles intact. No scleral icterus.  MOUTH: Moist mucosal membrane. Dentition intact. No abscess noted.  EAR, NOSE, THROAT: Clear without exudates. No external lesions.  NECK: Supple. No thyromegaly. No nodules. No JVD.  PULMONARY: Diffuse coarse rhonchi right sided  +wheezes CARDIOVASCULAR: S1 and S2. Regular  rate and rhythm. No murmurs, rubs, or gallops. No edema. Pedal pulses 2+ bilaterally.  GASTROINTESTINAL: Soft, nontender, nondistended. No masses. Positive bowel sounds. No hepatosplenomegaly.  MUSCULOSKELETAL: No swelling, clubbing, or edema. Range of motion full in all extremities.  NEUROLOGIC: Cranial nerves II through XII are intact. No gross focal neurological deficits. Sensation intact. Reflexes intact.  SKIN: No ulceration, lesions, rashes, or cyanosis. Skin warm and dry. Turgor intact.  PSYCHIATRIC: Mood, affect within normal limits. The patient is awake, alert and oriented x 3. Insight, judgment intact.       IMAGING    DG Chest 2 View  Result Date: 08/02/2021 CLINICAL DATA:  Hypoxia. EXAM: CHEST - 2 VIEW COMPARISON:  Chest x-ray dated March 02, 2021. FINDINGS: The heart size and mediastinal contours are within normal limits. Similar emphysematous changes with coarsened interstitial markings. New opacity in the peripheral left upper lobe at the site of previously treated lung cancer. No pleural effusion or pneumothorax. No acute osseous abnormality. IMPRESSION: 1. New opacity in the peripheral left upper lobe at the site of previously treated lung cancer. This could reflect recurrence, post treatment change, or pneumonia. 2. COPD. Electronically Signed   By: Titus Dubin M.D.   On: 08/02/2021 13:31   CT Angio Chest PE W and/or Wo Contrast  Result Date: 08/02/2021 CLINICAL DATA:  Pulmonary embolus suspected with high probability. Low oxygen saturation. Chronic oxygen use. EXAM: CT ANGIOGRAPHY CHEST WITH  CONTRAST TECHNIQUE: Multidetector CT imaging of the chest was performed using the standard protocol during bolus administration of intravenous contrast. Multiplanar CT image reconstructions and MIPs were obtained to evaluate the vascular anatomy. CONTRAST:  81mL OMNIPAQUE IOHEXOL 350 MG/ML SOLN COMPARISON:  Chest radiograph 08/02/2021.  CT chest 02/21/2021 FINDINGS: Cardiovascular: Good opacification of the central and segmental pulmonary arteries. No focal filling defects. No evidence of significant pulmonary embolus. Cardiac enlargement. No pericardial effusions. Normal caliber thoracic aorta. No aortic dissection. Great vessel origins are patent. Coronary artery and aortic calcifications. Mediastinum/Nodes: Bilateral hilar and mediastinal lymphadenopathy. Largest lymph node is in the right hilar region, measuring 2.7 cm short axis dimension. This is new since the previous CT. Thyroid gland is unremarkable. Esophagus is decompressed. Lungs/Pleura: Diffuse emphysematous changes in the lungs. There is an area of consolidation in the posteroinferior left upper lung corresponding to lesion seen at chest radiograph. A prior CT scan demonstrated a mass in this area. The area of abnormality has enlarged significantly since that time with probable central mass lesion measuring 2.6 cm in diameter with surrounding consolidation. Combined with the new lymphadenopathy, this could represent recurrent or metastatic tumor with surrounding infiltration. Pneumonia would be a less likely consideration. No pleural effusions. No pneumothorax. Patchy infiltration or atelectasis in the lung bases. Upper Abdomen: No acute abnormalities demonstrated in the visualized upper abdomen. Musculoskeletal: Multiple old bilateral rib fractures. Focal area sclerosis in the right lateral fourth rib is likely a healed fracture. Sclerotic metastasis would be a less likely consideration. Old mid sternal fracture deformity. Degenerative changes in the  spine. Review of the MIP images confirms the above findings. IMPRESSION: 1. No evidence of significant pulmonary embolus. 2. Mass with surrounding consolidation in the left upper lung corresponding to chest radiographic abnormality. This is enlarging since previous CT and suggests recurrent or metastatic neoplasm. Pneumonia possible but felt less likely. 3. Enlarging bilateral hilar and mediastinal lymphadenopathy, possibly metastatic. 4. Multiple old rib and sternal fractures. 5. Aortic atherosclerosis and emphysema. Electronically Signed   By: Oren Beckmann.D.  On: 08/02/2021 17:18   US Venous Img Lower Unilateral Left  Result Date: 08/02/2021 CLINICAL DATA:  leg swelling eval for dvt EXAM: LEFT LOWER EXTREMITY VENOUS DOPPLER ULTRASOUND TECHNIQUE: Gray-scale sonography with compression, as well as color and duplex ultrasound, were performed to evaluate the deep venous system(s) from the level of the common femoral vein through the popliteal and proximal calf veins. COMPARISON:  None. FINDINGS: VENOUS Normal compressibility of the common femoral, superficial femoral, and popliteal veins, as well as the visualized calf veins. Visualized portions of profunda femoral vein and great saphenous vein unremarkable. No filling defects to suggest DVT on grayscale or color Doppler imaging. Doppler waveforms show normal direction of venous flow, normal respiratory plasticity and response to augmentation. Limited views of the contralateral common femoral vein are unremarkable. IMPRESSION: No evidence of DVT in the left lower extremity. Electronically Signed   By: Margaretha Sheffield M.D.   On: 08/02/2021 17:38   DG Chest Portable 1 View  Result Date: 08/05/2021 CLINICAL DATA:  65 year old male with shortness of breath. Nonproductive cough. Recently hospitalized. Left upper lung mass. EXAM: PORTABLE CHEST 1 VIEW COMPARISON:  Chest CTA 08/02/2021 and earlier. FINDINGS: Portable AP semi upright view at 0645 hours.  Increased and confluent patchy opacity throughout the right lower lung since 08/02/2021. No superimposed pneumothorax or pleural effusion. Left upper lobe roughly 3 cm spiculated lung nodule/mass redemonstrated as described on the recent CTA. Mediastinal contours are stable. Visualized tracheal air column is within normal limits. No acute osseous abnormality identified. IMPRESSION: 1. Progressive and confluent right lower lung opacity compatible with Acute Pneumonia. 2. Spiculated Left Upper Lobe Mass with underlying Emphysema (ICD10-J43.9). See further discussion on recent Chest CTA (Impression #2) Electronically Signed   By: Genevie Ann M.D.   On: 08/05/2021 06:58       ASSESSMENT/PLAN   Here with copd exacerbation and light lower lung pneumonia. On benzo and swallowing issues could this be aspiration related since he was in the hospital would cover for anaerobes and hospital acquired organisms as you are doing He has Stage IV COPD, hypercapnia, not compliant with his trelegy machine. Fev1 down to 0.6 liters ( 20 %). Poor lung function.  Daliresp 250 mg q day,  Oxygen keep sats 91=93 % Steroids ( soulmedrol 40 mg tid) Duo ned did Budenoside ned 0.5 bid or the equivalent to the above Incentive spiro  LUL mass,  not a surgical candidate, s/p xrt, maas enlarging, and nodes present.  Out patient pet scan Discuss with oncology oncology    Obstructive sleep apnea,  encouraging to wear his trelegy machine at night  Maintain ideal weight Nocturnal o2   Past ba swallow c/w micro aspiration, + gerd Speech eval  Protonix 40 mg q day      Thank you for allowing me to participate in the care of this patient.   Patient/Family are satisfied with care plan and all questions have been answered.  This document was prepared using Dragon voice recognition software and may include unintentional dictation errors.     Wallene Huh, M.D.  Division of [place]

## 2021-08-06 NOTE — Progress Notes (Signed)
Pharmacy Antibiotic Note  Christian Fields is a 65 y.o. male admitted on 08/05/2021. Patient discharged 8/27 and presented back with shortness of breath. Pharmacy has been consulted for vancomycin and cefepime dosing for pneumonia.  Plan:  Scr improved 1.73>>1.17 -Day 2- Will adjust Vancomycin from 1000 mg IV q24hr  to 1500mg  Q24h Goal AUC 400-550 Expected AUC: 482 Cmin 10.6 SCr used: 1.17  -f/u MRSA PCR  -Day 2- adjust Cefepime from 2 g IV q12hr to 2 gm IV q8h   Height: 5\' 8"  (172.7 cm) Weight: 79.5 kg (175 lb 4.3 oz) IBW/kg (Calculated) : 68.4  Temp (24hrs), Avg:98.1 F (36.7 C), Min:97.9 F (36.6 C), Max:98.2 F (36.8 C)  Recent Labs  Lab 08/02/21 1244 08/03/21 0656 08/04/21 0509 08/05/21 0639 08/05/21 0640 08/06/21 0436  WBC 9.6 3.8* 10.3 27.1*  --  15.4*  CREATININE 1.11 1.02 1.01 1.73*  --  1.17  LATICACIDVEN  --   --   --   --  1.6  --      Estimated Creatinine Clearance: 60.1 mL/min (by C-G formula based on SCr of 1.17 mg/dL).    Allergies  Allergen Reactions   Bee Venom Anaphylaxis    Antimicrobials this admission: Zosyn 8/28 x 1 Vancomycin 8/28 >> Cefepime 8/28 >>  Microbiology results: 8/28 BCx: NG < 24hrs 8/28, 8/29 MRSA PCR: pending  Thank you for allowing pharmacy to be a part of this patient's care.  Noralee Space, PharmD Clinical Pharmacist 08/06/2021 11:54 AM

## 2021-08-06 NOTE — Progress Notes (Signed)
[place] [facility] [place] NAME: Christian Fields    MR#:  [id number]  DATE OF BIRTH:  Apr 13, 1956  SUBJECTIVE:  patient came in with increasing shortness of breath. He was just discharge yesterday. Went home again with cough and shortness of breath. Chest x-ray shows right lower lobe infiltrate.  denies any difficulty swallowing.  REVIEW OF SYSTEMS:   Review of Systems  Constitutional:  Negative for chills, fever and weight loss.  HENT:  Negative for ear discharge, ear pain and nosebleeds.   Eyes:  Negative for blurred vision, pain and discharge.  Respiratory:  Positive for cough and shortness of breath. Negative for sputum production, wheezing and stridor.   Cardiovascular:  Negative for chest pain, palpitations, orthopnea and PND.  Gastrointestinal:  Negative for abdominal pain, diarrhea, nausea and vomiting.  Genitourinary:  Negative for frequency and urgency.  Musculoskeletal:  Negative for back pain and joint pain.  Neurological:  Positive for weakness. Negative for sensory change, speech change and focal weakness.  Psychiatric/Behavioral:  Negative for depression and hallucinations. The patient is not nervous/anxious.   Tolerating Diet:yes Tolerating PT: ambulatory  DRUG ALLERGIES:   Allergies  Allergen Reactions  . Bee Venom Anaphylaxis    VITALS:  Blood pressure 135/83, pulse 84, temperature 98 F (36.7 C), resp. rate 17, height 5\' 8"  (1.727 m), weight 79.5 kg, SpO2 98 %.  PHYSICAL EXAMINATION:   Physical Exam  GENERAL:  65 y.o.-year-old patient lying in the bed with no acute distress.  LUNGS: decreased breath sounds bilaterally, no wheezing, rales, rhonchi. No use of accessory muscles of respiration.  CARDIOVASCULAR: S1, S2 normal. No murmurs, rubs, or gallops.  ABDOMEN: Soft, nontender, nondistended. Bowel sounds present. No organomegaly or mass.  EXTREMITIES: No cyanosis, clubbing or edema b/l.    NEUROLOGIC: non  focal PSYCHIATRIC:  patient is alert and oriented x 3.  SKIN: No obvious rash, lesion, or ulcer.   LABORATORY PANEL:  CBC Recent Labs  Lab 08/06/21 0436  WBC 15.4*  HGB 9.8*  HCT 30.9*  PLT 211    Chemistries  Recent Labs  Lab 08/03/21 0656 08/04/21 0509 08/06/21 0436  NA 139   < > 137  K 4.1   < > 3.6  CL 101   < > 99  CO2 30   < > 30  GLUCOSE 164*   < > 218*  BUN 19   < > 33*  CREATININE 1.02   < > 1.17  CALCIUM 8.1*   < > 7.5*  AST 14*  --   --   ALT 9  --   --   ALKPHOS 104  --   --   BILITOT 0.8  --   --    < > = values in this interval not displayed.   Cardiac Enzymes No results for input(s): TROPONINI in the last 168 hours. RADIOLOGY:  DG Chest Portable 1 View  Result Date: 08/05/2021 CLINICAL DATA:  65 year old male with shortness of breath. Nonproductive cough. Recently hospitalized. Left upper lung mass. EXAM: PORTABLE CHEST 1 VIEW COMPARISON:  Chest CTA 08/02/2021 and earlier. FINDINGS: Portable AP semi upright view at 0645 hours. Increased and confluent patchy opacity throughout the right lower lung since 08/02/2021. No superimposed pneumothorax or pleural effusion. Left upper lobe roughly 3 cm spiculated lung nodule/mass redemonstrated as described on the recent CTA. Mediastinal contours are stable. Visualized tracheal air column is within normal limits. No acute osseous abnormality identified. IMPRESSION: 1. Progressive  and confluent right lower lung opacity compatible with Acute Pneumonia. 2. Spiculated Left Upper Lobe Mass with underlying Emphysema (ICD10-J43.9). See further discussion on recent Chest CTA (Impression #2) Electronically Signed   By: Genevie Ann M.D.   On: 08/05/2021 06:58   ASSESSMENT AND PLAN:   Christian Fields is a 65 y.o. male with medical history significant of COPD on 2 L oxygen, hypertension, hyperlipidemia, diabetes mellitus, asthma, TIA, GERD, gout, depression, foot ulcer, OSA on CPAP, anemia, AAA, polysubstance abuse, non-small cell lung  cancer (s/p of RXT), tobacco abuse, CKD-3A, who presents with shortness of breath.  Acute on chronic respiratory failure with hypoxia and hypercapnia and sepsis due to COPD exacerbation and HCAP (healthcare-associated pneumonia):  --Patient meets criteria for sepsis with WBC 27.1, tachycardia with heart rate 117, tachypnea with RR 38, lactic acid is 1.6, currently hemodynamically stable. - IV Vancomycin and cefepime (patient received 1 dose of Zosyn in ED)--will de-escalate by tomorrow -off BiPAP - Mucinex for cough  - Bronchodilators -  Procalcitonin --> 0.89 -pulmonary consultation with Dr.Aleskerov -- chest x-ray shows right lower lobe infiltrate ? Aspiration -- speech therapy to see patient -- sepsis improving  Hypertension -IV hydralazine as needed -Metoprolol   Hyperlipidemia -Zocor and Zetia   Sleep apnea -CPAP when off BiPAP   TIA (transient ischemic attack) -Aspirin, Zocor, Zetia, Plavix   Type 2 diabetes mellitus with foot ulcer (CODE) ([place]): Recent A1c 5.6, well controlled.  Patient taking [place] -Sliding scale insulin   AKI (acute kidney injury) [facility]): Recent baseline creatinine 1.01 on 08/04/2021, his creatinine is 1.73, BUN 36 today.  Likely due to dehydration. -Avoid using renal toxic medications, -will not give IV fluid since patient already has elevated BNP 798 -- continue to monitor.    Gout -Allopurinol   Depression -Cymbalta   Chronic diastolic CHF (congestive heart failure) ([place]): 2D echo on 01/31/2021 showed EF of 55 to 60% with grade 2 diastolic dysfunction.  Patient has elevated BNP 789, but no pulmonary edema on chest x-ray, does not seem to have CHF exacerbation, but patient is at high risk of developing CHF exacerbation. -Watch volume status closely -- clinically stable.   Chronic pain syndrome/Chronic opioid dependent -Continue home Suboxone   Procedures: Family communication :none Consults : pulmonary CODE STATUS: full DVT  Prophylaxis : Level of care: Progressive Cardiac Status is: Inpatient  Remains inpatient appropriate because:Inpatient level of care appropriate due to severity of illness  Dispo: The patient is from: Home              Anticipated d/c is to: Home              Patient currently is not medically stable to d/c.   Difficult to place patient No        TOTAL TIME TAKING CARE OF THIS PATIENT: 30 minutes.  >50% time spent on counselling and coordination of care  Note: This dictation was prepared with Dragon dictation along with smaller phrase technology. Any transcriptional errors that result from this process are unintentional.  Fritzi Mandes M.D    [facility]   CC: Primary care physician; Remi Haggard, FNP Patient ID: Genella Mech, male   DOB: 08-09-1956, 65 y.o.   MRN: [id number]

## 2021-08-07 DIAGNOSIS — I5032 Chronic diastolic (congestive) heart failure: Secondary | ICD-10-CM | POA: Diagnosis not present

## 2021-08-07 DIAGNOSIS — J9621 Acute and chronic respiratory failure with hypoxia: Secondary | ICD-10-CM | POA: Diagnosis not present

## 2021-08-07 DIAGNOSIS — Z7189 Other specified counseling: Secondary | ICD-10-CM | POA: Diagnosis not present

## 2021-08-07 DIAGNOSIS — J690 Pneumonitis due to inhalation of food and vomit: Secondary | ICD-10-CM

## 2021-08-07 DIAGNOSIS — J9622 Acute and chronic respiratory failure with hypercapnia: Secondary | ICD-10-CM | POA: Diagnosis not present

## 2021-08-07 DIAGNOSIS — J441 Chronic obstructive pulmonary disease with (acute) exacerbation: Secondary | ICD-10-CM | POA: Diagnosis not present

## 2021-08-07 LAB — CREATININE, SERUM
Creatinine, Ser: 1.19 mg/dL (ref 0.61–1.24)
GFR, Estimated: >60 mL/min (ref >60)

## 2021-08-07 LAB — GLUCOSE, CAPILLARY
Glucose-Capillary: 262 mg/dL — ABNORMAL HIGH (ref 70–99)
Glucose-Capillary: 196 mg/dL — ABNORMAL HIGH (ref 70–99)

## 2021-08-07 LAB — LEGIONELLA PNEUMOPHILA SEROGP 1 UR AG: L. pneumophila Serogp 1 Ur Ag: NEGATIVE

## 2021-08-07 MED ORDER — AMOXICILLIN-POT CLAVULANATE 875-125 MG PO TABS: 1.0000 | ORAL_TABLET | Freq: Two times a day (BID) | ORAL | 0 refills | Status: AC

## 2021-08-07 MED ORDER — AMOXICILLIN-POT CLAVULANATE 875-125 MG PO TABS
1.0000 | ORAL_TABLET | Freq: Two times a day (BID) | ORAL | Status: DC
  Administered 2021-08-07: 1 via ORAL
  Filled 2021-08-07: qty 1

## 2021-08-07 MED ORDER — ALBUTEROL SULFATE HFA 108 (90 BASE) MCG/ACT IN AERS
2.0000 | INHALATION_SPRAY | RESPIRATORY_TRACT | Status: DC | PRN
  Filled 2021-08-07 (×2): qty 6.7

## 2021-08-07 MED ORDER — SODIUM CHLORIDE 0.9 % IV SOLN
2.0000 g | Freq: Two times a day (BID) | INTRAVENOUS | Status: DC
  Filled 2021-08-07 (×3): qty 2

## 2021-08-07 MED ORDER — FLUTICASONE FUROATE-VILANTEROL 100-25 MCG/INH IN AEPB
1.0000 | INHALATION_SPRAY | Freq: Every day | RESPIRATORY_TRACT | Status: DC
  Administered 2021-08-07: 1 via RESPIRATORY_TRACT
  Filled 2021-08-07: qty 28

## 2021-08-07 NOTE — Progress Notes (Signed)
Pharmacy Antibiotic Note  Christian Fields is a 65 y.o. male admitted on 08/05/2021. Patient discharged 8/27 and presented back with shortness of breath. Pharmacy has been consulted for vancomycin and cefepime dosing for pneumonia.  Plan: Following Scr daily 1.73>>1.17>1.19  MRSA PCR negative - vanc dc'ed  -Day 3- adjust Cefepime from 2 g IV q8hr to 2 gm IV q12h   Height: 5\' 8"  (172.7 cm) Weight: 80.7 kg (177 lb 14.4 oz) IBW/kg (Calculated) : 68.4  Temp (24hrs), Avg:97.9 F (36.6 C), Min:97.5 F (36.4 C), Max:98.1 F (36.7 C)  Recent Labs  Lab 08/02/21 1244 08/03/21 0656 08/04/21 0509 08/05/21 0639 08/05/21 0640 08/06/21 0436 08/07/21 0419  WBC 9.6 3.8* 10.3 27.1*  --  15.4*  --   CREATININE 1.11 1.02 1.01 1.73*  --  1.17 1.19  LATICACIDVEN  --   --   --   --  1.6  --   --      Estimated Creatinine Clearance: 59.1 mL/min (by C-G formula based on SCr of 1.19 mg/dL).    Allergies  Allergen Reactions   Bee Venom Anaphylaxis    Antimicrobials this admission: Zosyn 8/28 x 1 Vancomycin 8/28 >>8/30 Cefepime 8/28 >>  Microbiology results: 8/28 BCx: NG < 24hrs 8/28, 8/29 MRSA PCR: NEG  Thank you for allowing pharmacy to be a part of this patient's care.  Lu Duffel, PharmD Clinical Pharmacist 08/07/2021 8:57 AM

## 2021-08-07 NOTE — Discharge Instructions (Signed)
Follow speech therapy recommendation for Regular diet with Nectar thickened liquids--follow instructions given at discharge

## 2021-08-07 NOTE — Discharge Summary (Signed)
[facility] NAME: Christian Fields    MR#:  [id number]  DATE OF BIRTH:  [birth year]  DATE OF ADMISSION:  08/05/2021 ADMITTING PHYSICIAN: Ivor Costa, MD  DATE OF DISCHARGE: 08/07/2021  PRIMARY CARE PHYSICIAN: Remi Haggard, FNP    ADMISSION DIAGNOSIS:  COPD exacerbation ([place]) [J44.1] Acute on chronic respiratory failure with hypoxia (HCC) [J96.21] Acute on chronic respiratory failure with hypoxia and hypercapnia (HCC) [J96.21, J96.22]  DISCHARGE DIAGNOSIS:  Aspiration Pneumonia --recurrent/Non compliance to Nectar thick liquids Acute on Chronic respiratory failure with COPD/severe on chronic oxygen  SECONDARY DIAGNOSIS:   Past Medical History:  Diagnosis Date  . Anemia   . Anxiety   . Arthritis   . Asthma   . Cancer ([place])    Basal Cell Skin Cancer  . Chronic back pain   . COPD (chronic obstructive pulmonary disease) ([place])   . Depression   . Diabetes mellitus ([place])   . Dyspnea   . GERD (gastroesophageal reflux disease)   . Gout   . Gout   . Headache   . History of blood clots    Left Leg--July 2018  . History of kidney stones   . Hyperlipidemia   . Hyperlipidemia   . Hypertension   . Kidney stones   . Neuropathy   . On home oxygen therapy    2 L / M  . Pneumonia 06/2017  . Sleep apnea   . Ulcer of foot [facility])    Right    HOSPITAL COURSE:  Christian Fields is a 65 y.o. male with medical history significant of COPD on 2 L oxygen, hypertension, hyperlipidemia, diabetes mellitus, asthma, TIA, GERD, gout, depression, foot ulcer, OSA on CPAP, anemia, AAA, polysubstance abuse, non-small cell lung cancer (s/p of RXT), tobacco abuse, CKD-3A, who presents with shortness of breath.   Acute on chronic respiratory failure with hypoxia and hypercapnia and sepsis due to COPD exacerbation Aspiration Pneumonia/ Non compliance to Nectar thick liquids --Patient meets criteria for sepsis with WBC 27.1, tachycardia with heart rate  117, tachypnea with RR 38, lactic acid is 1.6, currently hemodynamically stable. - IV Vancomycin and cefepime--change to Po augmentin -off BiPAP - Mucinex for cough  - Bronchodilators -  Procalcitonin --> 0.89 -pulmonary consultation with Dr.Fleming--agrees with po abxs and f/u out pt -- chest x-ray shows right lower lobe infiltrate s/o Aspiration -- speech therapy Dysphagia level 3 (mech soft) diet w/ gravies w/ NECTAR consistency liquids -- pt refused to have his liquids thickened. He stated he declined "this" before "at [facility]". Thorough education was given on aspiration, potential airway compromise including pneumonia, thickening of liquids -- pt again declined Nectar liquids -- sepsis resolved   Known h/o Left lung mass --pt will f/u dr Raul Del or dr Janese Banks for PET scan. Dter aware  Hypertension -IV hydralazine as needed -Metoprolol   Hyperlipidemia -Zocor and Zetia   Sleep apnea -CPAP when off BiPAP   TIA (transient ischemic attack) -Aspirin, Zocor, Zetia, Plavix   Type 2 diabetes mellitus with foot ulcer (CODE) ([place]): Recent A1c 5.6, well controlled.  Patient taking [place] -Sliding scale insulin   AKI (acute kidney injury) [facility]): Recent baseline creatinine 1.01 on 08/04/2021, his creatinine is 1.73, BUN 36 today.  Likely due to dehydration. -Avoid using renal toxic medications, -will not give IV fluid since patient already has elevated BNP 798 -- continue to monitor.    Gout -Allopurinol   Depression -Cymbalta   Chronic diastolic  CHF (congestive heart failure) ([place]): 2D echo on 01/31/2021 showed EF of 55 to 60% with grade 2 diastolic dysfunction.  Patient has elevated BNP 789, but no pulmonary edema on chest x-ray, does not seem to have CHF exacerbation, but patient is at high risk of developing CHF exacerbation. -Watch volume status closely -- clinically stable.   Chronic pain syndrome/Chronic opioid dependent -Continue home Suboxone  Pt overall at  baseline. He understand the risk of recurrent aspiration if he does not comply with Nectar thick liquids. This was d/w his dter Drue Dun on the phone.   Procedures:none Family communication  Dter Drue Dun Consults : pulmonary CODE STATUS: full DVT Prophylaxis :enoxaparin Level of care: Progressive Cardiac Status is: Inpatient   Remains inpatient appropriate because:Inpatient level of care appropriate due to severity of illness   Dispo: The patient is from: Home              Anticipated d/c is to: Home              Patient currently is medically optimized to d/c.              Difficult to place patient No         Treatment Team:  Ottie Glazier, MD  DRUG ALLERGIES:   Allergies  Allergen Reactions  . Bee Venom Anaphylaxis    DISCHARGE MEDICATIONS:   Allergies as of 08/07/2021       Reactions   Bee Venom Anaphylaxis        Medication List     STOP taking these medications    doxycycline 100 MG tablet Commonly known as: VIBRA-TABS   famotidine 20 MG tablet Commonly known as: PEPCID       TAKE these medications    albuterol 108 (90 Base) MCG/ACT inhaler Commonly known as: VENTOLIN HFA Inhale 2 puffs into the lungs every 4 (four) hours as needed for wheezing or shortness of breath.   allopurinol 300 MG tablet Commonly known as: ZYLOPRIM Take 600 mg by mouth daily.   amoxicillin-clavulanate 875-125 MG tablet Commonly known as: AUGMENTIN Take 1 tablet by mouth 2 (two) times daily for 7 days.   aspirin EC 81 MG tablet Take 1 tablet by mouth daily.   buprenorphine-naloxone 8-2 mg Subl SL tablet Commonly known as: SUBOXONE Place 1 tablet under the tongue in the morning, at noon, and at bedtime.   cholecalciferol 1000 units tablet Commonly known as: VITAMIN D Take 1,000 Units by mouth daily.   clopidogrel 75 MG tablet Commonly known as: PLAVIX Take 1 tablet (75 mg total) by mouth daily.   DULoxetine 30 MG capsule Commonly known as:  CYMBALTA Take 1 capsule by mouth daily.   ezetimibe 10 MG tablet Commonly known as: ZETIA Take 10 mg by mouth daily.   Farxiga 10 MG Tabs tablet Generic drug: dapagliflozin propanediol Take 10 mg by mouth daily.   fluticasone furoate-vilanterol 100-25 MCG/INH Aepb Commonly known as: BREO ELLIPTA Inhale 1 puff into the lungs daily for 30 doses.   gabapentin 800 MG tablet Commonly known as: NEURONTIN Take 800 mg by mouth 4 (four) times daily.   Kombiglyze XR 2.04-999 MG Tb24 Generic drug: Saxagliptin-Metformin Take 2 tablets by mouth daily.   metoprolol succinate 50 MG 24 hr tablet Commonly known as: TOPROL-XL Take 50 mg by mouth daily.   OXYGEN Inhale 2 L into the lungs.   pantoprazole 40 MG tablet Commonly known as: PROTONIX Take 40 mg by mouth daily.   predniSONE 10  MG tablet Commonly known as: DELTASONE Take 4 tablets (40 mg total) by mouth daily for 3 days, THEN 3 tablets (30 mg total) daily for 3 days, THEN 2 tablets (20 mg total) daily for 3 days, THEN 1 tablet (10 mg total) daily for 3 days. Start taking on: August 04, 2021   simvastatin 40 MG tablet Commonly known as: ZOCOR Take 1 tablet by mouth daily.   umeclidinium bromide 62.5 MCG/INH Aepb Commonly known as: INCRUSE ELLIPTA Inhale 1 puff into the lungs daily.        If you experience worsening of your admission symptoms, develop shortness of breath, life threatening emergency, suicidal or homicidal thoughts you must seek medical attention immediately by calling 911 or calling your MD immediately  if symptoms less severe.  You Must read complete instructions/literature along with all the possible adverse reactions/side effects for all the Medicines you take and that have been prescribed to you. Take any new Medicines after you have completely understood and accept all the possible adverse reactions/side effects.   Please note  You were cared for by a hospitalist during your hospital stay. If you have  any questions about your discharge medications or the care you received while you were in the hospital after you are discharged, you can call the unit and asked to speak with the hospitalist on call if the hospitalist that took care of you is not available. Once you are discharged, your primary care physician will handle any further medical issues. Please note that NO REFILLS for any discharge medications will be authorized once you are discharged, as it is imperative that you return to your primary care physician (or establish a relationship with a primary care physician if you do not have one) for your aftercare needs so that they can reassess your need for medications and monitor your lab values. Today   SUBJECTIVE   I feel ok I don't like the thick liquid--gives bad taste  VITAL SIGNS:  Blood pressure (!) 145/82, pulse 92, temperature 97.8 F (36.6 C), temperature source Oral, resp. rate 19, height 5\' 8"  (1.727 m), weight 80.7 kg, SpO2 94 %.  I/O:   Intake/Output Summary (Last 24 hours) at 08/07/2021 1013 Last data filed at 08/07/2021 0947 Gross per 24 hour  Intake 794.86 ml  Output 1325 ml  Net -530.14 ml    PHYSICAL EXAMINATION:  GENERAL:  65 y.o.-year-old patient lying in the bed with no acute distress.  Chronically ill LUNGS: decreased breath sounds bilaterally, no wheezing, rales,rhonchi or crepitation. No use of accessory muscles of respiration.  CARDIOVASCULAR: S1, S2 normal. No murmurs, rubs, or gallops.  ABDOMEN: Soft, non-tender, non-distended. Bowel sounds present. No organomegaly or mass.  EXTREMITIES: No pedal edema, cyanosis, or clubbing.  NEUROLOGIC:non focal PSYCHIATRIC: The patient is alert and oriented x 3.  SKIN: No obvious rash, lesion, or ulcer.   DATA REVIEW:   CBC  Recent Labs  Lab 08/06/21 0436  WBC 15.4*  HGB 9.8*  HCT 30.9*  PLT 211    Chemistries  Recent Labs  Lab 08/03/21 0656 08/04/21 0509 08/06/21 0436 08/07/21 0419  NA 139   < > 137   --   K 4.1   < > 3.6  --   CL 101   < > 99  --   CO2 30   < > 30  --   GLUCOSE 164*   < > 218*  --   BUN 19   < > 33*  --  CREATININE 1.02   < > 1.17 1.19  CALCIUM 8.1*   < > 7.5*  --   AST 14*  --   --   --   ALT 9  --   --   --   ALKPHOS 104  --   --   --   BILITOT 0.8  --   --   --    < > = values in this interval not displayed.    Microbiology Results   Recent Results (from the past 240 hour(s))  Resp Panel by RT-PCR (Flu A&B, Covid) Nasopharyngeal Swab     Status: None   Collection Time: 08/02/21  4:17 PM   Specimen: Nasopharyngeal Swab; Nasopharyngeal(NP) swabs in vial transport medium  Result Value Ref Range Status   SARS Coronavirus 2 by RT PCR NEGATIVE NEGATIVE Final    Comment: (NOTE) SARS-CoV-2 target nucleic acids are NOT DETECTED.  The SARS-CoV-2 RNA is generally detectable in upper respiratory specimens during the acute phase of infection. The lowest concentration of SARS-CoV-2 viral copies this assay can detect is 138 copies/mL. A negative result does not preclude SARS-Cov-2 infection and should not be used as the sole basis for treatment or other patient management decisions. A negative result may occur with  improper specimen collection/handling, submission of specimen other than nasopharyngeal swab, presence of viral mutation(s) within the areas targeted by this assay, and inadequate number of viral copies(<138 copies/mL). A negative result must be combined with clinical observations, patient history, and epidemiological information. The expected result is Negative.  Fact Sheet for Patients:  EntrepreneurPulse.com.au  Fact Sheet for Healthcare Providers:  IncredibleEmployment.be  This test is no t yet approved or cleared by the [place] FDA and  has been authorized for detection and/or diagnosis of SARS-CoV-2 by FDA under an Emergency Use Authorization (EUA). This EUA will remain  in effect (meaning this test  can be used) for the duration of the COVID-19 declaration under Section 564(b)(1) of the Act, 21 U.S.C.section 360bbb-3(b)(1), unless the authorization is terminated  or revoked sooner.       Influenza A by PCR NEGATIVE NEGATIVE Final   Influenza B by PCR NEGATIVE NEGATIVE Final    Comment: (NOTE) The Xpert Xpress SARS-CoV-2/FLU/RSV plus assay is intended as an aid in the diagnosis of influenza from Nasopharyngeal swab specimens and should not be used as a sole basis for treatment. Nasal washings and aspirates are unacceptable for Xpert Xpress SARS-CoV-2/FLU/RSV testing.  Fact Sheet for Patients: EntrepreneurPulse.com.au  Fact Sheet for Healthcare Providers: IncredibleEmployment.be  This test is not yet approved or cleared by the [place] FDA and has been authorized for detection and/or diagnosis of SARS-CoV-2 by FDA under an Emergency Use Authorization (EUA). This EUA will remain in effect (meaning this test can be used) for the duration of the COVID-19 declaration under Section 564(b)(1) of the Act, 21 U.S.C. section 360bbb-3(b)(1), unless the authorization is terminated or revoked.  Performed at [facility], [address] [place], [place] [address]   Resp Panel by RT-PCR (Flu A&B, Covid) Nasopharyngeal Swab     Status: None   Collection Time: 08/05/21  6:39 AM   Specimen: Nasopharyngeal Swab; Nasopharyngeal(NP) swabs in vial transport medium  Result Value Ref Range Status   SARS Coronavirus 2 by RT PCR NEGATIVE NEGATIVE Final    Comment: (NOTE) SARS-CoV-2 target nucleic acids are NOT DETECTED.  The SARS-CoV-2 RNA is generally detectable in upper respiratory specimens during the acute phase of infection. The lowest  concentration of SARS-CoV-2 viral copies this assay can detect is 138 copies/mL. A negative result does not preclude SARS-Cov-2 infection and should not be used as the sole basis for treatment or other  patient management decisions. A negative result may occur with  improper specimen collection/handling, submission of specimen other than nasopharyngeal swab, presence of viral mutation(s) within the areas targeted by this assay, and inadequate number of viral copies(<138 copies/mL). A negative result must be combined with clinical observations, patient history, and epidemiological information. The expected result is Negative.  Fact Sheet for Patients:  EntrepreneurPulse.com.au  Fact Sheet for Healthcare Providers:  IncredibleEmployment.be  This test is no t yet approved or cleared by the [place] FDA and  has been authorized for detection and/or diagnosis of SARS-CoV-2 by FDA under an Emergency Use Authorization (EUA). This EUA will remain  in effect (meaning this test can be used) for the duration of the COVID-19 declaration under Section 564(b)(1) of the Act, 21 U.S.C.section 360bbb-3(b)(1), unless the authorization is terminated  or revoked sooner.       Influenza A by PCR NEGATIVE NEGATIVE Final   Influenza B by PCR NEGATIVE NEGATIVE Final    Comment: (NOTE) The Xpert Xpress SARS-CoV-2/FLU/RSV plus assay is intended as an aid in the diagnosis of influenza from Nasopharyngeal swab specimens and should not be used as a sole basis for treatment. Nasal washings and aspirates are unacceptable for Xpert Xpress SARS-CoV-2/FLU/RSV testing.  Fact Sheet for Patients: EntrepreneurPulse.com.au  Fact Sheet for Healthcare Providers: IncredibleEmployment.be  This test is not yet approved or cleared by the [place] FDA and has been authorized for detection and/or diagnosis of SARS-CoV-2 by FDA under an Emergency Use Authorization (EUA). This EUA will remain in effect (meaning this test can be used) for the duration of the COVID-19 declaration under Section 564(b)(1) of the Act, 21 U.S.C. section  360bbb-3(b)(1), unless the authorization is terminated or revoked.  Performed at [facility], [address] [place], [place] [address]   Culture, blood (routine x 2)     Status: None (Preliminary result)   Collection Time: 08/05/21  6:40 AM   Specimen: BLOOD  Result Value Ref Range Status   Specimen Description BLOOD LEFT ANTECUBITAL  Final   Special Requests   Final    BOTTLES DRAWN AEROBIC AND ANAEROBIC Blood Culture adequate volume   Culture   Final    NO GROWTH 2 DAYS Performed at [facility], [address] [place], [place] [address]    Report Status PENDING  Incomplete  Culture, blood (routine x 2)     Status: None (Preliminary result)   Collection Time: 08/05/21  6:40 AM   Specimen: BLOOD  Result Value Ref Range Status   Specimen Description BLOOD ASSIST CONTROL  Final   Special Requests   Final    BOTTLES DRAWN AEROBIC AND ANAEROBIC Blood Culture adequate volume   Culture   Final    NO GROWTH 2 DAYS Performed at [facility], [address], [place],  [address]    Report Status PENDING  Incomplete  MRSA Next Gen by PCR, Nasal     Status: None   Collection Time: 08/06/21  3:50 PM   Specimen: Nasal Mucosa; Nasal Swab  Result Value Ref Range Status   MRSA by PCR Next Gen NOT DETECTED NOT DETECTED Final    Comment: (NOTE) The GeneXpert MRSA Assay (FDA approved for NASAL specimens only), is one component of a comprehensive MRSA colonization surveillance program.  It is not intended to diagnose MRSA infection nor to guide or monitor treatment for MRSA infections. Test performance is not FDA approved in patients less than 64 years old. Performed at [facility], [address] [place], [place] [address]     RADIOLOGY:  No results found.   CODE STATUS:     Code Status Orders  (From admission, onward)           Start     Ordered   08/05/21 1855  Full code  Continuous        08/05/21 1854            Code Status History     Date Active Date Inactive Code Status Order ID Comments User Context   08/02/2021 2141 08/04/2021 2137 Full Code [id number]  Elwyn Reach, MD Inpatient   02/26/2021 2157 03/09/2021 1758 Full Code [id number]  R[place], Huel Cote, NP ED   01/29/2021 2351 02/01/2021 1722 Full Code [id number]  Athena Masse, MD ED   11/12/2020 2253 11/18/2020 2110 Full Code [id number]  Neena Rhymes, MD ED   03/26/2020 0136 03/28/2020 2308 Full Code [id number]  Nicolette Bang, DO ED   01/19/2016 2021 01/20/2016 1818 Full Code [id number]  Hillary Bow, MD Inpatient        TOTAL TIME TAKING CARE OF THIS PATIENT: 35 minutes.    Fritzi Mandes M.D  [facility]    CC: Primary care physician; Remi Haggard, FNP

## 2021-08-07 NOTE — Consult Note (Signed)
Consultation Note Date: 08/07/2021   Patient Name: Christian Fields  DOB: 1956-04-25  MRN: [id number]  Age / Sex: 65 y.o., male  PCP: Remi Haggard, FNP Referring Physician: Fritzi Mandes, MD  Reason for Consultation: Establishing goals of care  HPI/Patient Profile: Christian Fields is a 65 y.o. male with medical history significant of COPD on 2 L oxygen, hypertension, hyperlipidemia, diabetes mellitus, asthma, TIA, GERD, gout, depression, foot ulcer, OSA on CPAP, anemia, AAA, polysubstance abuse, non-small cell lung cancer (s/p of RXT), tobacco abuse, CKD-3A, who presents with shortness of breath  Clinical Assessment and Goals of Care: Patient is familiar to PMT team as I have seen him on a prior admission. He is on phone standing at sink upon entering. He walked to the bed and requested to catch his breath before talking.   He states he has 3 children. His daughter helps him. He lives at home alone.   He states his SOB has been significant. He states "my lungs are pretty much shot." He discusses his COPD exacerbations in the summer with the heat and humidity. Discussed his PNA.  Discussed his silent aspiration and how this can cause PNA.    We discussed his diagnoses, prognosis, GOC, EOL wishes disposition and options.  Created space and opportunity for patient  to explore thoughts and feelings regarding current medical information.   A detailed discussion was had today regarding advanced directives.  Concepts specific to code status, artifical feeding and hydration, IV antibiotics and rehospitalization were discussed.  The difference between an aggressive medical intervention path and a comfort care path was discussed.  Values and goals of care important to patient and family were attempted to be elicited.  Discussed limitations of medical interventions to prolong quality of life in some situations and  discussed the concept of human mortality.  He states he understands he will have recurrent hospitalizations and PNA if he does not follow a dysphagia diet. He states he has been told that at [facility] and [facility] previously- but now is seeing it himself. He states he would like to try the dysphagia diet, if it would help to keep him out of the hospital. Full code/ full scope. He is aware he is hospice appropriate should he choose that route.   SUMMARY OF RECOMMENDATIONS   --Patient would like to speak with SLP regarding his dysphagia diet and education.  --Full code/full scope --Recommend palliative at D/C.       Primary Diagnoses: Present on Admission:  Acute on chronic respiratory failure with hypoxia and hypercapnia (HCC)  AKI (acute kidney injury) ([place])  COPD exacerbation (HCC)  Hyperlipidemia  Hypertension  Polysubstance (excluding opioids) dependence (HCC)  Sleep apnea  TIA (transient ischemic attack)  Type 2 diabetes mellitus with foot ulcer (CODE) (HCC)  Sepsis ([place])  Gout  Depression  Chronic diastolic CHF (congestive heart failure) ([place])  HCAP (healthcare-associated pneumonia)   I have reviewed the medical record, interviewed the patient and family, and examined the patient. The following aspects are pertinent.  Past Medical History:  Diagnosis Date   Anemia    Anxiety    Arthritis    Asthma    Cancer ([place])    Basal Cell Skin Cancer   Chronic back pain    COPD (chronic obstructive pulmonary disease) (HCC)    Depression    Diabetes mellitus (HCC)    Dyspnea    GERD (gastroesophageal reflux disease)    Gout    Gout    Headache    History of blood clots    Left Leg--July 2018   History of kidney stones    Hyperlipidemia    Hyperlipidemia    Hypertension    Kidney stones    Neuropathy    On home oxygen therapy    2 L / M   Pneumonia 06/2017   Sleep apnea    Ulcer of foot ([place])    Right   Social History   Socioeconomic History   Marital status: Widowed     Spouse name: Not on file   Number of children: Not on file   Years of education: Not on file   Highest education level: Not on file  Occupational History   Not on file  Tobacco Use   Smoking status: Former    Packs/day: 0.50    Types: Cigarettes    Start date: 2018   Smokeless tobacco: Never  Vaping Use   Vaping Use: Never used  Substance and Sexual Activity   Alcohol use: Yes    Alcohol/week: 1.0 standard drink    Types: 1 Cans of beer per week    Comment: occ   Drug use: No   Sexual activity: Not Currently  Other Topics Concern   Not on file  Social History Narrative   Not on file   Social Determinants of Health   Financial Resource Strain: Not on file  Food Insecurity: Not on file  Transportation Needs: Not on file  Physical Activity: Not on file  Stress: Not on file  Social Connections: Not on file   Family History  Problem Relation Age of Onset   Hypertension Mother    Scheduled Meds:  allopurinol  300 mg Oral Daily   amoxicillin-clavulanate  1 tablet Oral BID   aspirin EC  81 mg Oral Daily   buprenorphine-naloxone  1 tablet Sublingual TID   cholecalciferol  1,000 Units Oral Daily   clopidogrel  75 mg Oral Daily   DULoxetine  30 mg Oral Daily   enoxaparin (LOVENOX) injection  40 mg Subcutaneous Q24H   ezetimibe  10 mg Oral Daily   fluticasone furoate-vilanterol  1 puff Inhalation Daily   gabapentin  800 mg Oral QID   insulin aspart  0-5 Units Subcutaneous QHS   insulin aspart  0-9 Units Subcutaneous TID WC   ipratropium-albuterol  3 mL Nebulization Q4H   metoprolol succinate  50 mg Oral Daily   pantoprazole  40 mg Oral Daily   simvastatin  40 mg Oral Daily   Continuous Infusions: PRN Meds:.acetaminophen, albuterol, clonazePAM, dextromethorphan-guaiFENesin, ondansetron (ZOFRAN) IV Medications Prior to Admission:  Prior to Admission medications   Medication Sig Start Date End Date Taking? Authorizing Provider  albuterol (VENTOLIN HFA) 108 (90  Base) MCG/ACT inhaler Inhale 2 puffs into the lungs every 4 (four) hours as needed for wheezing or shortness of breath.   Yes [provider]  allopurinol (ZYLOPRIM) 300 MG tablet Take 600 mg by mouth daily. 02/24/20  Yes [provider]  aspirin EC 81 MG  tablet Take 1 tablet by mouth daily.   Yes [provider]  buprenorphine-naloxone (SUBOXONE) 8-2 mg SUBL SL tablet Place 1 tablet under the tongue in the morning, at noon, and at bedtime.   Yes [provider]  cholecalciferol (VITAMIN D) 1000 units tablet Take 1,000 Units by mouth daily.   Yes [provider]  clopidogrel (PLAVIX) 75 MG tablet Take 1 tablet (75 mg total) by mouth daily. 02/02/21  Yes Shawna Clamp, MD  DULoxetine (CYMBALTA) 30 MG capsule Take 1 capsule by mouth daily. 01/17/16  Yes [provider]  ezetimibe (ZETIA) 10 MG tablet Take 10 mg by mouth daily. 02/24/20  Yes [provider]  famotidine (PEPCID) 20 MG tablet Take 1 tablet (20 mg total) by mouth 2 (two) times daily. 03/09/21 08/05/21 Yes Val Riles, MD  FARXIGA 10 MG TABS tablet Take 10 mg by mouth daily. 09/14/20  Yes [provider]  fluticasone furoate-vilanterol (BREO ELLIPTA) 100-25 MCG/INH AEPB Inhale 1 puff into the lungs daily for 30 doses. 08/05/21 09/04/21 Yes Little Ishikawa, MD  gabapentin (NEURONTIN) 800 MG tablet Take 800 mg by mouth 4 (four) times daily. 07/14/21  Yes [provider]  KOMBIGLYZE XR 2.04-999 MG TB24 Take 2 tablets by mouth daily. 01/17/16  Yes [provider]  metoprolol succinate (TOPROL-XL) 50 MG 24 hr tablet Take 50 mg by mouth daily. 02/24/20  Yes [provider]  OXYGEN Inhale 2 L into the lungs.   Yes [provider]  pantoprazole (PROTONIX) 40 MG tablet Take 40 mg by mouth daily. 06/20/21  Yes [provider]  predniSONE (DELTASONE) 10 MG tablet Take 4 tablets (40 mg total) by mouth daily for 3 days, THEN 3 tablets (30 mg  total) daily for 3 days, THEN 2 tablets (20 mg total) daily for 3 days, THEN 1 tablet (10 mg total) daily for 3 days. 08/04/21 08/16/21 Yes Little Ishikawa, MD  simvastatin (ZOCOR) 40 MG tablet Take 1 tablet by mouth daily. 01/17/16  Yes [provider]  umeclidinium bromide (INCRUSE ELLIPTA) 62.5 MCG/INH AEPB Inhale 1 puff into the lungs daily. 08/05/21 09/04/21 Yes Little Ishikawa, MD  amoxicillin-clavulanate (AUGMENTIN) 875-125 MG tablet Take 1 tablet by mouth 2 (two) times daily for 7 days. 08/07/21 08/14/21  Fritzi Mandes, MD   Allergies  Allergen Reactions   Bee Venom Anaphylaxis   Review of Systems  Respiratory:  Positive for cough and shortness of breath.    Physical Exam Pulmonary:     Comments: Some WOB with walking.  Neurological:     Mental Status: He is alert.    Vital Signs: BP (!) 142/78 (BP Location: Right Arm)   Pulse 92   Temp 97.6 F (36.4 C) (Oral)   Resp 19   Ht 5\' 8"  (1.727 m)   Wt 80.7 kg   SpO2 94%   BMI 27.05 kg/m  Pain Scale: 0-10   Pain Score: 0-No pain   SpO2: SpO2: 94 % O2 Device:SpO2: 94 % O2 Flow Rate: .O2 Flow Rate (L/min): 4 L/min  IO: Intake/output summary:  Intake/Output Summary (Last 24 hours) at 08/07/2021 1310 Last data filed at 08/07/2021 1100 Gross per 24 hour  Intake 1034.86 ml  Output 1325 ml  Net -290.14 ml    LBM: Last BM Date: 08/04/21 Baseline Weight: Weight: 79.8 kg Most recent weight: Weight: 80.7 kg        Time In: 10:00 Time Out: 10:50 Time Total: 50 min Greater than 50%  of this time was spent counseling and coordinating care related to the above assessment and plan.  Signed by: Asencion Gowda, NP   Please contact Palliative Medicine Team phone at [phone] for questions and concerns.  For individual provider: See Shea Evans

## 2021-08-07 NOTE — Plan of Care (Signed)
Problem: Education: Goal: Knowledge of General Education information will improve Description: Including pain rating scale, medication(s)/side effects and non-pharmacologic comfort measures 08/07/2021 1059 by Cristela Blue, RN Outcome: Adequate for Discharge 08/07/2021 1058 by Cristela Blue, RN Outcome: Adequate for Discharge   Problem: Health Behavior/Discharge Planning: Goal: Ability to manage health-related needs will improve 08/07/2021 1059 by Cristela Blue, RN Outcome: Adequate for Discharge 08/07/2021 1058 by Cristela Blue, RN Outcome: Adequate for Discharge   Problem: Clinical Measurements: Goal: Ability to maintain clinical measurements within normal limits will improve 08/07/2021 1059 by Cristela Blue, RN Outcome: Adequate for Discharge 08/07/2021 1058 by Cristela Blue, RN Outcome: Adequate for Discharge Goal: Will remain free from infection 08/07/2021 1059 by Cristela Blue, RN Outcome: Adequate for Discharge 08/07/2021 1058 by Cristela Blue, RN Outcome: Adequate for Discharge Goal: Diagnostic test results will improve 08/07/2021 1059 by Cristela Blue, RN Outcome: Adequate for Discharge 08/07/2021 1058 by Cristela Blue, RN Outcome: Adequate for Discharge Goal: Respiratory complications will improve 08/07/2021 1059 by Cristela Blue, RN Outcome: Adequate for Discharge 08/07/2021 1058 by Cristela Blue, RN Outcome: Adequate for Discharge Goal: Cardiovascular complication will be avoided 08/07/2021 1059 by Cristela Blue, RN Outcome: Adequate for Discharge 08/07/2021 1058 by Cristela Blue, RN Outcome: Adequate for Discharge   Problem: Activity: Goal: Risk for activity intolerance will decrease 08/07/2021 1059 by Cristela Blue, RN Outcome: Adequate for Discharge 08/07/2021 1058 by Cristela Blue, RN Outcome: Adequate for Discharge   Problem: Nutrition: Goal: Adequate nutrition will be maintained 08/07/2021 1059 by Cristela Blue, RN Outcome: Adequate for  Discharge 08/07/2021 1058 by Cristela Blue, RN Outcome: Adequate for Discharge   Problem: Coping: Goal: Level of anxiety will decrease 08/07/2021 1059 by Cristela Blue, RN Outcome: Adequate for Discharge 08/07/2021 1058 by Cristela Blue, RN Outcome: Adequate for Discharge   Problem: Elimination: Goal: Will not experience complications related to bowel motility 08/07/2021 1059 by Cristela Blue, RN Outcome: Adequate for Discharge 08/07/2021 1058 by Cristela Blue, RN Outcome: Adequate for Discharge Goal: Will not experience complications related to urinary retention 08/07/2021 1059 by Cristela Blue, RN Outcome: Adequate for Discharge 08/07/2021 1058 by Cristela Blue, RN Outcome: Adequate for Discharge   Problem: Pain Managment: Goal: General experience of comfort will improve 08/07/2021 1059 by Cristela Blue, RN Outcome: Adequate for Discharge 08/07/2021 1058 by Cristela Blue, RN Outcome: Adequate for Discharge   Problem: Safety: Goal: Ability to remain free from injury will improve 08/07/2021 1059 by Cristela Blue, RN Outcome: Adequate for Discharge 08/07/2021 1058 by Cristela Blue, RN Outcome: Adequate for Discharge   Problem: Skin Integrity: Goal: Risk for impaired skin integrity will decrease 08/07/2021 1059 by Cristela Blue, RN Outcome: Adequate for Discharge 08/07/2021 1058 by Cristela Blue, RN Outcome: Adequate for Discharge   Problem: Education: Goal: Knowledge of disease or condition will improve 08/07/2021 1059 by Cristela Blue, RN Outcome: Adequate for Discharge 08/07/2021 1058 by Cristela Blue, RN Outcome: Adequate for Discharge Goal: Knowledge of the prescribed therapeutic regimen will improve 08/07/2021 1059 by Cristela Blue, RN Outcome: Adequate for Discharge 08/07/2021 1058 by Cristela Blue, RN Outcome: Adequate for Discharge Goal: Individualized Educational Video(s) 08/07/2021 1059 by Cristela Blue, RN Outcome: Adequate for Discharge 08/07/2021  1058 by Cristela Blue, RN Outcome: Adequate for Discharge   Problem: Activity: Goal: Ability to tolerate increased activity will improve 08/07/2021 1059 by Cristela Blue, RN Outcome: Adequate for Discharge 08/07/2021 1058 by Cristela Blue, RN Outcome: Adequate for Discharge Goal: Will verbalize the importance of balancing activity with adequate rest periods 08/07/2021 1059 by Toribio Harbour,  Lonn [place], RN Outcome: Adequate for Discharge 08/07/2021 1058 by Cristela Blue, RN Outcome: Adequate for Discharge   Problem: Respiratory: Goal: Ability to maintain a clear airway will improve 08/07/2021 1059 by Cristela Blue, RN Outcome: Adequate for Discharge 08/07/2021 1058 by Cristela Blue, RN Outcome: Adequate for Discharge Goal: Levels of oxygenation will improve 08/07/2021 1059 by Cristela Blue, RN Outcome: Adequate for Discharge 08/07/2021 1058 by Cristela Blue, RN Outcome: Adequate for Discharge Goal: Ability to maintain adequate ventilation will improve 08/07/2021 1059 by Cristela Blue, RN Outcome: Adequate for Discharge 08/07/2021 1058 by Cristela Blue, RN Outcome: Adequate for Discharge   Problem: Activity: Goal: Ability to tolerate increased activity will improve 08/07/2021 1059 by Cristela Blue, RN Outcome: Adequate for Discharge 08/07/2021 1058 by Cristela Blue, RN Outcome: Adequate for Discharge   Problem: Clinical Measurements: Goal: Ability to maintain a body temperature in the normal range will improve 08/07/2021 1059 by Cristela Blue, RN Outcome: Adequate for Discharge 08/07/2021 1058 by Cristela Blue, RN Outcome: Adequate for Discharge   Problem: Respiratory: Goal: Ability to maintain adequate ventilation will improve 08/07/2021 1059 by Cristela Blue, RN Outcome: Adequate for Discharge 08/07/2021 1058 by Cristela Blue, RN Outcome: Adequate for Discharge Goal: Ability to maintain a clear airway will improve 08/07/2021 1059 by Cristela Blue, RN Outcome: Adequate for  Discharge 08/07/2021 1058 by Cristela Blue, RN Outcome: Adequate for Discharge

## 2021-08-10 LAB — CULTURE, BLOOD (ROUTINE X 2)
Culture: NO GROWTH
Culture: NO GROWTH
Special Requests: ADEQUATE
Special Requests: ADEQUATE

## 2021-08-29 ENCOUNTER — Ambulatory Visit
Admission: RE | Admit: 2021-08-29 | Discharge: 2021-08-29 | Disposition: A | Discharge status: Home / Self Care | Payer: Medicare HMO | Source: Ambulatory Visit | Attending: Oncology | Admitting: Oncology

## 2021-08-29 ENCOUNTER — Other Ambulatory Visit: Payer: Self-pay

## 2021-08-29 DIAGNOSIS — I700 Atherosclerosis of aorta: Secondary | ICD-10-CM | POA: Insufficient documentation

## 2021-08-29 DIAGNOSIS — J439 Emphysema, unspecified: Secondary | ICD-10-CM | POA: Insufficient documentation

## 2021-08-29 DIAGNOSIS — I2510 Atherosclerotic heart disease of native coronary artery without angina pectoris: Secondary | ICD-10-CM | POA: Diagnosis not present

## 2021-08-29 DIAGNOSIS — E611 Iron deficiency: Secondary | ICD-10-CM | POA: Insufficient documentation

## 2021-08-31 ENCOUNTER — Encounter: Payer: Self-pay | Admitting: Oncology

## 2021-08-31 ENCOUNTER — Ambulatory Visit
Admission: RE | Admit: 2021-08-31 | Discharge: 2021-08-31 | Disposition: A | Discharge status: Home / Self Care | Payer: Medicare HMO | Source: Ambulatory Visit | Attending: Radiation Oncology | Admitting: Radiation Oncology

## 2021-08-31 ENCOUNTER — Other Ambulatory Visit: Payer: Self-pay

## 2021-08-31 ENCOUNTER — Encounter: Payer: Self-pay | Admitting: Radiation Oncology

## 2021-08-31 ENCOUNTER — Inpatient Hospital Stay (HOSPITAL_BASED_OUTPATIENT_CLINIC_OR_DEPARTMENT_OTHER): Payer: Medicare HMO | Admitting: Oncology

## 2021-08-31 ENCOUNTER — Other Ambulatory Visit: Payer: Self-pay | Admitting: *Deleted

## 2021-08-31 ENCOUNTER — Inpatient Hospital Stay: Payer: Medicare HMO | Attending: Oncology

## 2021-08-31 VITALS — BP 128/85 | HR 95 | Temp 97.9°F | Resp 20 | Wt 173.5 lb

## 2021-08-31 VITALS — BP 119/83 | HR 99 | Temp 97.1°F | Wt 172.5 lb

## 2021-08-31 DIAGNOSIS — D509 Iron deficiency anemia, unspecified: Secondary | ICD-10-CM | POA: Diagnosis not present

## 2021-08-31 DIAGNOSIS — G4730 Sleep apnea, unspecified: Secondary | ICD-10-CM | POA: Diagnosis not present

## 2021-08-31 DIAGNOSIS — Z7951 Long term (current) use of inhaled steroids: Secondary | ICD-10-CM | POA: Insufficient documentation

## 2021-08-31 DIAGNOSIS — R911 Solitary pulmonary nodule: Secondary | ICD-10-CM

## 2021-08-31 DIAGNOSIS — Z923 Personal history of irradiation: Secondary | ICD-10-CM | POA: Diagnosis not present

## 2021-08-31 DIAGNOSIS — I10 Essential (primary) hypertension: Secondary | ICD-10-CM | POA: Insufficient documentation

## 2021-08-31 DIAGNOSIS — C3490 Malignant neoplasm of unspecified part of unspecified bronchus or lung: Secondary | ICD-10-CM

## 2021-08-31 DIAGNOSIS — Z86718 Personal history of other venous thrombosis and embolism: Secondary | ICD-10-CM | POA: Diagnosis not present

## 2021-08-31 DIAGNOSIS — C3412 Malignant neoplasm of upper lobe, left bronchus or lung: Secondary | ICD-10-CM

## 2021-08-31 DIAGNOSIS — Z79899 Other long term (current) drug therapy: Secondary | ICD-10-CM | POA: Diagnosis not present

## 2021-08-31 DIAGNOSIS — Z7984 Long term (current) use of oral hypoglycemic drugs: Secondary | ICD-10-CM | POA: Diagnosis not present

## 2021-08-31 DIAGNOSIS — E785 Hyperlipidemia, unspecified: Secondary | ICD-10-CM | POA: Diagnosis not present

## 2021-08-31 DIAGNOSIS — E611 Iron deficiency: Secondary | ICD-10-CM

## 2021-08-31 DIAGNOSIS — Z7982 Long term (current) use of aspirin: Secondary | ICD-10-CM | POA: Diagnosis not present

## 2021-08-31 DIAGNOSIS — E119 Type 2 diabetes mellitus without complications: Secondary | ICD-10-CM | POA: Insufficient documentation

## 2021-08-31 LAB — COMPREHENSIVE METABOLIC PANEL
ALT: 10 U/L (ref 0–44)
AST: 21 U/L (ref 15–41)
Albumin: 3.3 g/dL — ABNORMAL LOW (ref 3.5–5.0)
Alkaline Phosphatase: 102 U/L (ref 38–126)
Anion gap: 10 (ref 5–15)
BUN: 17 mg/dL (ref 8–23)
CO2: 28 mmol/L (ref 22–32)
Calcium: 8.9 mg/dL (ref 8.9–10.3)
Chloride: 99 mmol/L (ref 98–111)
Creatinine, Ser: 1.2 mg/dL (ref 0.61–1.24)
GFR, Estimated: >60 mL/min (ref >60)
Glucose, Bld: 126 mg/dL — ABNORMAL HIGH (ref 70–99)
Potassium: 4.4 mmol/L (ref 3.5–5.1)
Sodium: 137 mmol/L (ref 135–145)
Total Bilirubin: 0.7 mg/dL (ref 0.3–1.2)
Total Protein: 7.2 g/dL (ref 6.5–8.1)

## 2021-08-31 LAB — CBC WITH DIFFERENTIAL/PLATELET
Abs Immature Granulocytes: 0.03 10*3/uL (ref 0.00–0.07)
Basophils Absolute: 0.1 10*3/uL (ref 0.0–0.1)
Basophils Relative: 1 %
Eosinophils Absolute: 0.4 10*3/uL (ref 0.0–0.5)
Eosinophils Relative: 6 %
HCT: 35.3 % — ABNORMAL LOW (ref 39.0–52.0)
Hemoglobin: 11.2 g/dL — ABNORMAL LOW (ref 13.0–17.0)
Immature Granulocytes: 0 %
Lymphocytes Relative: 20 %
Lymphs Abs: 1.4 10*3/uL (ref 0.7–4.0)
MCH: 30.7 pg (ref 26.0–34.0)
MCHC: 31.7 g/dL (ref 30.0–36.0)
MCV: 96.7 fL (ref 80.0–100.0)
Monocytes Absolute: 0.4 10*3/uL (ref 0.1–1.0)
Monocytes Relative: 6 %
Neutro Abs: 4.6 10*3/uL (ref 1.7–7.7)
Neutrophils Relative %: 67 %
Platelets: 269 10*3/uL (ref 150–400)
RBC: 3.65 MIL/uL — ABNORMAL LOW (ref 4.22–5.81)
RDW: 14.5 % (ref 11.5–15.5)
WBC: 6.8 10*3/uL (ref 4.0–10.5)
nRBC: 0 % (ref 0.0–0.2)

## 2021-08-31 LAB — IRON AND TIBC
Iron: 36 ug/dL — ABNORMAL LOW (ref 45–182)
Saturation Ratios: 12 % — ABNORMAL LOW (ref 17.9–39.5)
TIBC: 297 ug/dL (ref 250–450)
UIBC: 261 ug/dL

## 2021-08-31 LAB — FERRITIN: Ferritin: 94 ng/mL (ref 24–336)

## 2021-08-31 NOTE — Progress Notes (Signed)
Radiation Oncology Follow up Note  Name: Christian Fields   Date:   08/31/2021 MRN:  [id number] DOB: [birth year]    This 65 y.o. male presents to the clinic today for 79-month follow-up status post SBRT treatment to his left upper lobe for stage I non-small cell lung cancer.  REFERRING PROVIDER: Remi Haggard, FNP  HPI: Patient is a 65 year old male now at 11 months having completed SBRT to his left upper lobe for stage I non-small cell lung cancer.  He had recent been hospitalized had repeat imaging of his chest showing possible progression of the left upper lobe although on my review this certainly can be changes consistent with high-dose SBRT treatment.  He is on nasal oxygen.  He is having some problems with that oxygen I have asked him to talk to pulmonology about that.  He specifically denies cough hemoptysis or chest tightness..  COMPLICATIONS OF TREATMENT: none  FOLLOW UP COMPLIANCE: keeps appointments   PHYSICAL EXAM:  BP 119/83   Pulse 99   Temp (!) 97.1 F (36.2 C) (Tympanic)   Wt 172 lb 8 oz (78.2 kg)   BMI 26.23 kg/m  Wheelchair-bound male on nasal oxygen in NAD.  Well-developed well-nourished patient in NAD. HEENT reveals PERLA, EOMI, discs not visualized.  Oral cavity is clear. No oral mucosal lesions are identified. Neck is clear without evidence of cervical or supraclavicular adenopathy. Lungs are clear to A&P. Cardiac examination is essentially unremarkable with regular rate and rhythm without murmur rub or thrill. Abdomen is benign with no organomegaly or masses noted. Motor sensory and DTR levels are equal and symmetric in the upper and lower extremities. Cranial nerves II through XII are grossly intact. Proprioception is intact. No peripheral adenopathy or edema is identified. No motor or sensory levels are noted. Crude visual fields are within normal range.  RADIOLOGY RESULTS: CT scans reviewed compatible with above-stated findings I compared them to prior CT scans  and PET CT scans  PLAN: Although there does seem to be some imaging progression in that area this all could be consistent with a high dose from SBRT.  I am ordering a PET CT scan and will follow up with the patient.  Should this be hypermetabolic may offer salvage treatment with SBRT.  Patient and daughter both comprehend my recommendations well.  Appointments were made.  I would like to take this opportunity to thank you for allowing me to participate in the care of your patient.Noreene Filbert, MD

## 2021-09-02 ENCOUNTER — Encounter: Payer: Self-pay | Admitting: Oncology

## 2021-09-02 NOTE — Progress Notes (Signed)
Hematology/Oncology Consult note [facility]  Telephone:([phone] [phone]  Patient Care Team: Ziglar, Lincoln Brigham, MD as PCP - General (Family Medicine) Telford Nab, RN as Oncology Nurse Navigator Noreene Filbert, MD as Referring Physician (Radiation Oncology) Sindy Guadeloupe, MD as Consulting Physician (Hematology and Oncology)   Name of the patient: Christian Fields  [id number]  30-Jan-1956   Date of visit: 09/02/21  Diagnosis- stage I lung cancer s/p radiation Normocytic anemia  Chief complaint/ Reason for visit-routine follow-up of lung cancer  Heme/Onc history: Patient is a 65 year old male who was admitted to the hospital in April 2021 for community-acquired pneumonia and as a part of the work-up he had a CT angio chest done which incidentally showed a 1 cm irregular left upper lobe lung nodule repeat CT scan in August 2021 showed an interval increase in the size from 1 to 1.4 cm.  No appreciable thoracic adenopathy.  Severe centrilobular emphysema.  Chronic pulmonary hypertension.  Patient has been seen by Dr. Raul Del and has been considered to be high risk for biopsy from their standpoint.  He underwent SBRT to the left upper lobe lung lesion.  Patient now referred for anemia   Most recent labs from September 2021 showed a normal liver and kidney function.  TSH was normal.  Vitamin D levels were low.  CBC showed white count of 12.6, H&H of 10.9/37 with an MCV of 102 and a platelet count of 358.   Anemia work-up from 10/27/2020 showed a mildly low ferritin level of 28 with an iron saturation of 8% myeloma panel, serum free light chains, haptoglobin, TSH and LDH was normal.  B12 levels were low at 166.  Interval history-patient reports feeling at his baseline state of health.  He has baseline fatigue and exertional shortness of breath which has remained unchanged.  ECOG PS- 2 Pain scale- 0   Review of systems- Review of Systems   Constitutional:  Positive for malaise/fatigue. Negative for chills, fever and weight loss.  HENT:  Negative for congestion, ear discharge and nosebleeds.   Eyes:  Negative for blurred vision.  Respiratory:  Positive for shortness of breath. Negative for cough, hemoptysis, sputum production and wheezing.   Cardiovascular:  Negative for chest pain, palpitations, orthopnea and claudication.  Gastrointestinal:  Negative for abdominal pain, blood in stool, constipation, diarrhea, heartburn, melena, nausea and vomiting.  Genitourinary:  Negative for dysuria, flank pain, frequency, hematuria and urgency.  Musculoskeletal:  Negative for back pain, joint pain and myalgias.  Skin:  Negative for rash.  Neurological:  Negative for dizziness, tingling, focal weakness, seizures, weakness and headaches.  Endo/Heme/Allergies:  Does not bruise/bleed easily.  Psychiatric/Behavioral:  Negative for depression and suicidal ideas. The patient does not have insomnia.      Allergies  Allergen Reactions   Bee Venom Anaphylaxis     Past Medical History:  Diagnosis Date   Anemia    Anxiety    Arthritis    Asthma    Cancer ([place])    Basal Cell Skin Cancer   Chronic back pain    COPD (chronic obstructive pulmonary disease) (HCC)    Depression    Diabetes mellitus (HCC)    Dyspnea    GERD (gastroesophageal reflux disease)    Gout    Gout    Headache    History of blood clots    Left Leg--July 2018   History of kidney stones    Hyperlipidemia    Hyperlipidemia  Hypertension    Kidney stones    Neuropathy    On home oxygen therapy    2 L / M   Pneumonia 06/2017   Sleep apnea    Ulcer of foot ([place])    Right     Past Surgical History:  Procedure Laterality Date   APPENDECTOMY     DG FEET 2 VIEWS BILAT     LIPOMA EXCISION Right 08/15/2017   Procedure: EXCISION TUMOR(CYST) FOOT;  Surgeon: Albertine Patricia, DPM;  Location: [facility] ORS;  Service: Podiatry;  Laterality: Right;   OTHER SURGICAL  HISTORY Bilateral Foot surgery    Social History   Socioeconomic History   Marital status: Widowed    Spouse name: Not on file   Number of children: Not on file   Years of education: Not on file   Highest education level: Not on file  Occupational History   Not on file  Tobacco Use   Smoking status: Former    Packs/day: 0.50    Types: Cigarettes    Start date: 2018   Smokeless tobacco: Never  Vaping Use   Vaping Use: Never used  Substance and Sexual Activity   Alcohol use: Yes    Alcohol/week: 1.0 standard drink    Types: 1 Cans of beer per week    Comment: occ   Drug use: No   Sexual activity: Not Currently  Other Topics Concern   Not on file  Social History Narrative   Not on file   Social Determinants of Health   Financial Resource Strain: Not on file  Food Insecurity: Not on file  Transportation Needs: Not on file  Physical Activity: Not on file  Stress: Not on file  Social Connections: Not on file  Intimate Partner Violence: Not on file    Family History  Problem Relation Age of Onset   Hypertension Mother      Current Outpatient Medications:    albuterol (VENTOLIN HFA) 108 (90 Base) MCG/ACT inhaler, Inhale 2 puffs into the lungs every 4 (four) hours as needed for wheezing or shortness of breath., Disp: , Rfl:    allopurinol (ZYLOPRIM) 300 MG tablet, Take 600 mg by mouth daily., Disp: , Rfl:    aspirin EC 81 MG tablet, Take 1 tablet by mouth daily., Disp: , Rfl:    buprenorphine-naloxone (SUBOXONE) 8-2 mg SUBL SL tablet, Place 1 tablet under the tongue in the morning, at noon, and at bedtime., Disp: , Rfl:    clonazePAM (KLONOPIN) 1 MG tablet, Take 1 mg by mouth 2 (two) times daily as needed for anxiety., Disp: , Rfl:    clopidogrel (PLAVIX) 75 MG tablet, Take 1 tablet (75 mg total) by mouth daily., Disp: 30 tablet, Rfl: 1   DULoxetine (CYMBALTA) 30 MG capsule, Take 1 capsule by mouth daily., Disp: , Rfl:    ezetimibe (ZETIA) 10 MG tablet, Take 10 mg by  mouth daily., Disp: , Rfl:    FARXIGA 10 MG TABS tablet, Take 10 mg by mouth daily., Disp: , Rfl:    fluticasone furoate-vilanterol (BREO ELLIPTA) 100-25 MCG/INH AEPB, Inhale 1 puff into the lungs daily for 30 doses., Disp: 1 each, Rfl: 0   gabapentin (NEURONTIN) 800 MG tablet, Take 800 mg by mouth 4 (four) times daily., Disp: , Rfl:    KOMBIGLYZE XR 2.04-999 MG TB24, Take 2 tablets by mouth daily., Disp: , Rfl:    metoprolol succinate (TOPROL-XL) 50 MG 24 hr tablet, Take 50 mg by mouth daily., Disp: ,  Rfl:    OXYGEN, Inhale 2 L into the lungs., Disp: , Rfl:    pantoprazole (PROTONIX) 40 MG tablet, Take 40 mg by mouth daily., Disp: , Rfl:    roflumilast (DALIRESP) 500 MCG TABS tablet, Take by mouth., Disp: , Rfl:    simvastatin (ZOCOR) 40 MG tablet, Take 1 tablet by mouth daily., Disp: , Rfl:    umeclidinium bromide (INCRUSE ELLIPTA) 62.5 MCG/INH AEPB, Inhale 1 puff into the lungs daily., Disp: 1 each, Rfl: 0   cholecalciferol (VITAMIN D) 1000 units tablet, Take 1,000 Units by mouth daily. (Patient not taking: Reported on 08/31/2021), Disp: , Rfl:   Physical exam:  Vitals:   08/31/21 1308  BP: 128/85  Pulse: 95  Resp: 20  Temp: 97.9 F (36.6 C)  SpO2: 93%  Weight: 173 lb 8 oz (78.7 kg)   Physical Exam Constitutional:      Comments: Sitting in a wheelchair.  On home oxygen  Cardiovascular:     Rate and Rhythm: Normal rate and regular rhythm.     Heart sounds: Normal heart sounds.  Pulmonary:     Effort: Pulmonary effort is normal.     Comments: Breath sounds decreased bilaterally diffusely Skin:    General: Skin is warm and dry.  Neurological:     Mental Status: He is alert and oriented to person, place, and time.     CMP Latest Ref Rng & Units 08/31/2021  Glucose 70 - 99 mg/dL 126(H)  BUN 8 - 23 mg/dL 17  Creatinine 0.61 - 1.24 mg/dL 1.20  Sodium 135 - 145 mmol/L 137  Potassium 3.5 - 5.1 mmol/L 4.4  Chloride 98 - 111 mmol/L 99  CO2 22 - 32 mmol/L 28  Calcium 8.9 - 10.3  mg/dL 8.9  Total Protein 6.5 - 8.1 g/dL 7.2  Total Bilirubin 0.3 - 1.2 mg/dL 0.7  Alkaline Phos 38 - 126 U/L 102  AST 15 - 41 U/L 21  ALT 0 - 44 U/L 10   CBC Latest Ref Rng & Units 08/31/2021  WBC 4.0 - 10.5 K/uL 6.8  Hemoglobin 13.0 - 17.0 g/dL 11.2(L)  Hematocrit 39.0 - 52.0 % 35.3(L)  Platelets 150 - 400 K/uL 269    No images are attached to the encounter.  CT Chest Wo Contrast  Result Date: 08/30/2021 CLINICAL DATA:  History of left upper lobe mass, status post XRT EXAM: CT CHEST WITHOUT CONTRAST TECHNIQUE: Multidetector CT imaging of the chest was performed following the standard protocol without IV contrast. COMPARISON:  CT chest angiogram, 08/02/2021 FINDINGS: Cardiovascular: Aortic atherosclerosis. Normal heart size. Left coronary artery calcifications. No pericardial effusion. Gross enlargement of the main pulmonary artery, measuring up to 4.1 cm in caliber. Mediastinum/Nodes: No enlarged mediastinal, hilar, or axillary lymph nodes. Thyroid gland, trachea, and esophagus demonstrate no significant findings. Lungs/Pleura: Severe centrilobular emphysema. Mild, diffuse bilateral bronchial wall thickening. Similar appearance of a spiculated, masslike subpleural consolidation of the peripheral left upper lobe, measuring 2.7 x 2.6 cm (series 3, image 48). No pleural effusion or pneumothorax. Upper Abdomen: No acute abnormality. Musculoskeletal: No chest wall mass or suspicious bone lesions identified. IMPRESSION: 1. Similar appearance of a spiculated, masslike subpleural consolidation of the peripheral left upper lobe, measuring 2.7 x 2.6 cm. Findings are consistent with primary lung malignancy. 2. Severe emphysema. 3. Gross enlargement of the main pulmonary artery, measuring up to 4.1 cm in caliber, as can be seen in pulmonary hypertension. 4. Coronary artery disease. Aortic Atherosclerosis (ICD10-I70.0) and Emphysema (ICD10-J43.9). Electronically Signed  By: Eddie Candle M.D.   On: 08/30/2021  13:56   DG Chest Portable 1 View  Result Date: 08/05/2021 CLINICAL DATA:  65 year old male with shortness of breath. Nonproductive cough. Recently hospitalized. Left upper lung mass. EXAM: PORTABLE CHEST 1 VIEW COMPARISON:  Chest CTA 08/02/2021 and earlier. FINDINGS: Portable AP semi upright view at 0645 hours. Increased and confluent patchy opacity throughout the right lower lung since 08/02/2021. No superimposed pneumothorax or pleural effusion. Left upper lobe roughly 3 cm spiculated lung nodule/mass redemonstrated as described on the recent CTA. Mediastinal contours are stable. Visualized tracheal air column is within normal limits. No acute osseous abnormality identified. IMPRESSION: 1. Progressive and confluent right lower lung opacity compatible with Acute Pneumonia. 2. Spiculated Left Upper Lobe Mass with underlying Emphysema (ICD10-J43.9). See further discussion on recent Chest CTA (Impression #2) Electronically Signed   By: Genevie Ann M.D.   On: 08/05/2021 06:58     Assessment and plan- Patient is a 65 y.o. male with history of stage I left upper lobe lung cancer s/p SBRT and this is a routine follow-up visit  I have reviewed CT chest images independently and discussed findings with the patient.  Overall the left upper lobe area of consolidation remained stable 2.7 x 2.6 cm.  This has already been treated with radiation in the past.  However given the spiculated appearance Dr. Donella Stade recommends getting a PET CT scan followed by consideration for repeat radiation treatment if it is hypermetabolic.  I will see him back in 5 months with repeat CT chest without contrast   Patient did have evidence of iron deficiency anemia in the past but his hemoglobin has presently improved from 9.8-11.2 which is at his baseline.  Ferritin levels are normal at 94 and iron saturation remains mildly low at 12% with a normal TIBC.  I will hold off on any IV iron at this time.  Repeat CBC ferritin and iron studies in 3  in 6 months and I will see him in 6 months     Visit Diagnosis 1. Primary cancer of left upper lobe of lung ([place])   2. Iron deficiency anemia, unspecified iron deficiency anemia type      Dr. Randa Evens, MD, MPH [facility] [phone] 09/02/2021 11:59 AM

## 2021-09-03 ENCOUNTER — Encounter: Payer: Self-pay | Admitting: Oncology

## 2021-09-10 ENCOUNTER — Encounter: Payer: Self-pay | Admitting: Oncology

## 2021-09-12 ENCOUNTER — Ambulatory Visit
Admission: RE | Admit: 2021-09-12 | Discharge: 2021-09-12 | Disposition: A | Discharge status: Home / Self Care | Payer: Medicare Other | Source: Ambulatory Visit | Attending: Radiation Oncology | Admitting: Radiation Oncology

## 2021-09-12 ENCOUNTER — Other Ambulatory Visit: Payer: Self-pay

## 2021-09-12 DIAGNOSIS — J439 Emphysema, unspecified: Secondary | ICD-10-CM | POA: Diagnosis not present

## 2021-09-12 DIAGNOSIS — Z79899 Other long term (current) drug therapy: Secondary | ICD-10-CM | POA: Insufficient documentation

## 2021-09-12 DIAGNOSIS — I7143 Infrarenal abdominal aortic aneurysm, without rupture: Secondary | ICD-10-CM | POA: Insufficient documentation

## 2021-09-12 DIAGNOSIS — C3412 Malignant neoplasm of upper lobe, left bronchus or lung: Secondary | ICD-10-CM | POA: Insufficient documentation

## 2021-09-12 DIAGNOSIS — C3490 Malignant neoplasm of unspecified part of unspecified bronchus or lung: Secondary | ICD-10-CM

## 2021-09-12 DIAGNOSIS — I700 Atherosclerosis of aorta: Secondary | ICD-10-CM | POA: Insufficient documentation

## 2021-09-12 LAB — GLUCOSE, CAPILLARY: Glucose-Capillary: 84 mg/dL (ref 70–99)

## 2021-09-12 MED ORDER — FLUDEOXYGLUCOSE F - 18 (FDG) INJECTION
9.0000 | Freq: Once | INTRAVENOUS | Status: AC | PRN
  Administered 2021-09-12: 9.4 via INTRAVENOUS

## 2021-09-14 ENCOUNTER — Encounter: Payer: Self-pay | Admitting: Oncology

## 2021-09-17 LAB — BLOOD GAS, VENOUS
Acid-Base Excess: 1.2 mmol/L (ref 0.0–2.0)
Bicarbonate: 31.2 mmol/L — ABNORMAL HIGH (ref 20.0–28.0)
FIO2: 1
O2 Saturation: 79.9 %
Patient temperature: 37
pCO2, Ven: 78 mmHg (ref 44.0–60.0)
pH, Ven: 7.21 — ABNORMAL LOW (ref 7.250–7.430)
pO2, Ven: 54 mmHg — ABNORMAL HIGH (ref 32.0–45.0)

## 2021-09-19 ENCOUNTER — Ambulatory Visit: Payer: Medicare Other | Admitting: Radiation Oncology

## 2021-09-24 ENCOUNTER — Ambulatory Visit: Payer: Medicare Other | Admitting: Radiation Oncology

## 2021-09-28 ENCOUNTER — Other Ambulatory Visit: Payer: Self-pay

## 2021-09-28 ENCOUNTER — Ambulatory Visit
Admission: RE | Admit: 2021-09-28 | Discharge: 2021-09-28 | Disposition: A | Discharge status: Home / Self Care | Payer: Medicare Other | Source: Ambulatory Visit | Attending: Radiation Oncology | Admitting: Radiation Oncology

## 2021-09-28 VITALS — BP 124/79 | HR 85 | Temp 98.0°F | Resp 29 | Wt 165.7 lb

## 2021-09-28 DIAGNOSIS — C3412 Malignant neoplasm of upper lobe, left bronchus or lung: Secondary | ICD-10-CM

## 2021-09-28 NOTE — Progress Notes (Signed)
Radiation Oncology Follow up Note  Name: Christian Fields   Date:   09/28/2021 MRN:  [id number] DOB: 1956/09/04    This 65 y.o. male presents to the clinic today for follow-up the patient now out close to a year status post SBRT to his left upper lobe for stage I non-small cell lung cancer.  REFERRING PROVIDER: Macarthur Critchley, MD  HPI: Patient is a 65 year old male now out close to a year having completed SBRT to his left upper lobe for stage I lung cancer..  On recent imaging of his chest he was found to have possible "progression of the left upper lobe lesion although in my opinion this was consistent with high-dose SBRT treatment.  We ordered a PET CT scan showing very mild hypermetabolic activity around nodular consolidation of the peripheral left upper lobe lesion in my opinion again consistent with treatment response and not evidence of recurrence or persistent disease. COMPLICATIONS OF TREATMENT: none  FOLLOW UP COMPLIANCE: keeps appointments   PHYSICAL EXAM:  BP 124/79   Pulse 85   Temp 98 F (36.7 C) (Tympanic)   Resp (!) 29   Wt 165 lb 11.2 oz (75.2 kg)   BMI 25.19 kg/m  Well-developed well-nourished patient in NAD. HEENT reveals PERLA, EOMI, discs not visualized.  Oral cavity is clear. No oral mucosal lesions are identified. Neck is clear without evidence of cervical or supraclavicular adenopathy. Lungs are clear to A&P. Cardiac examination is essentially unremarkable with regular rate and rhythm without murmur rub or thrill. Abdomen is benign with no organomegaly or masses noted. Motor sensory and DTR levels are equal and symmetric in the upper and lower extremities. Cranial nerves II through XII are grossly intact. Proprioception is intact. No peripheral adenopathy or edema is identified. No motor or sensory levels are noted. Crude visual fields are within normal range.  RADIOLOGY RESULTS: PET CT scan reviewed compatible with above-stated findings  PLAN: Present time I  believe were dealing with successfully treated lesion in the left upper lobe.  Would continue to monitor with CT scans.  He already has a follow-up appoint with Dr. Janese Banks and further imaging several months down the road.  I have asked to see him back in 4 months for follow-up.  Patient knows to call with any concerns.  I would like to take this opportunity to thank you for allowing me to participate in the care of your patient.Noreene Filbert, MD

## 2021-10-31 ENCOUNTER — Ambulatory Visit: Payer: Medicare HMO | Admitting: Pain Medicine

## 2021-10-31 DIAGNOSIS — M899 Disorder of bone, unspecified: Secondary | ICD-10-CM | POA: Insufficient documentation

## 2021-10-31 DIAGNOSIS — Z79899 Other long term (current) drug therapy: Secondary | ICD-10-CM | POA: Insufficient documentation

## 2021-10-31 DIAGNOSIS — Z789 Other specified health status: Secondary | ICD-10-CM | POA: Insufficient documentation

## 2021-10-31 NOTE — Progress Notes (Deleted)
The patient called the same day of the visit to indicate that he was having car trouble and that he was not coming in for the appointment.

## 2021-11-14 IMAGING — CT CT CHEST W/O CM
2 of 4 series · 15 of 36 positions shown, 18 images · non-contrast
Comparison: 01/29/2021, 11/20/2020, 08/08/2020

CLINICAL DATA: Follow-up lung cancer, status post radiation therapy
to left lung, [DATE], history of COVID pneumonia [DATE]

EXAM:
CT CHEST WITHOUT CONTRAST
TECHNIQUE: Multidetector CT imaging of the chest was performed following the
standard protocol without IV contrast.

[Series 2: chest 2.00 · axial · 0.71mm/px · z∈[-1252,-962]mm · 12 of 173 slices shown, 15 images]
[im 14/173  mediastinal]
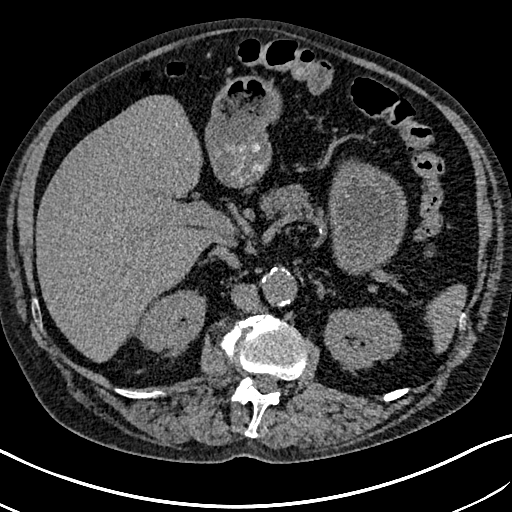
[im 14/173  lung]
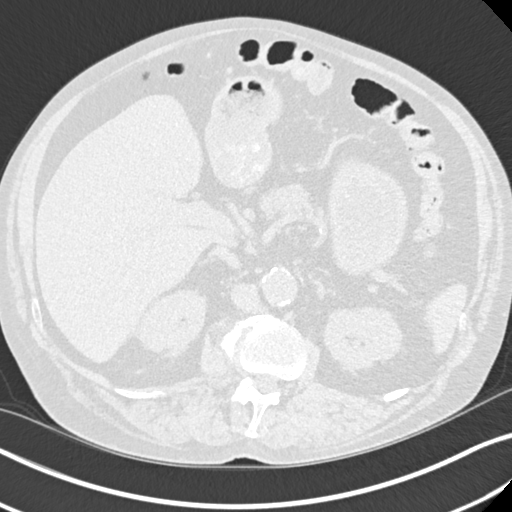
[im 27/173  lung]
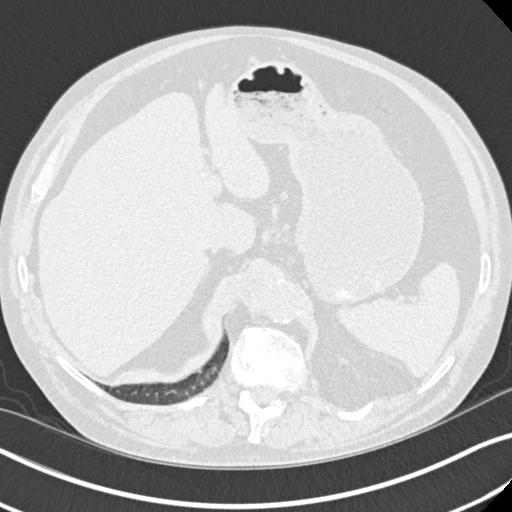
[im 40/173  lung]
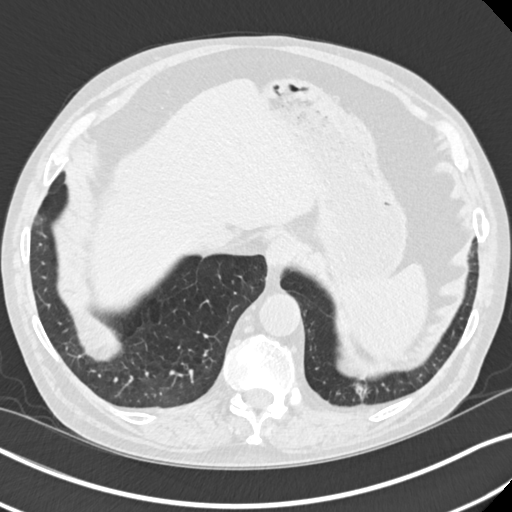
[im 53/173  lung]
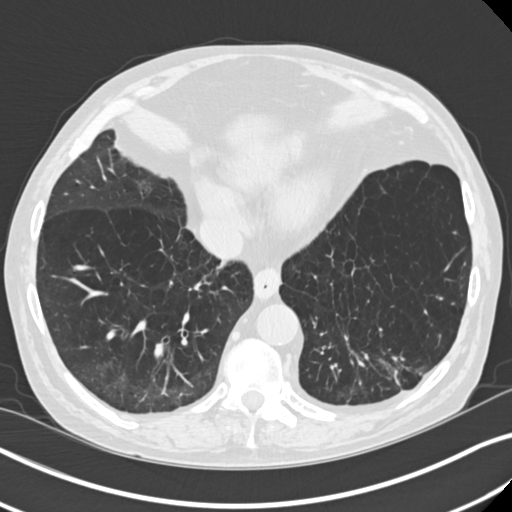
[im 67/173  mediastinal]
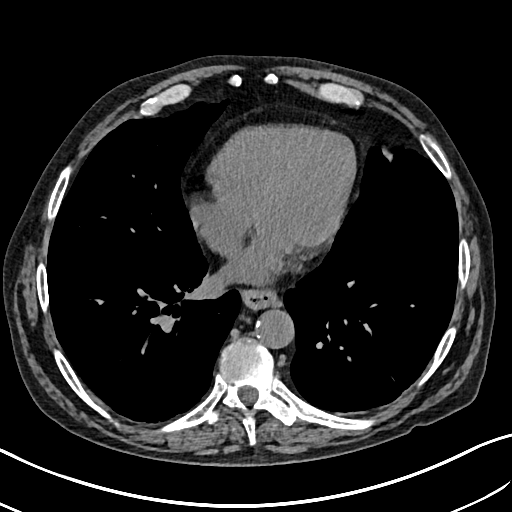
[im 67/173  lung]
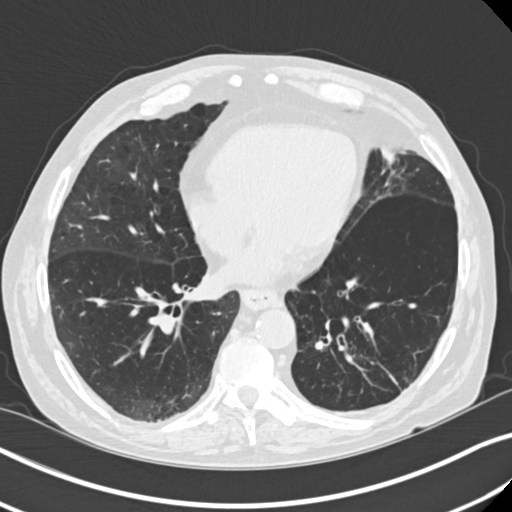
[im 80/173  lung]
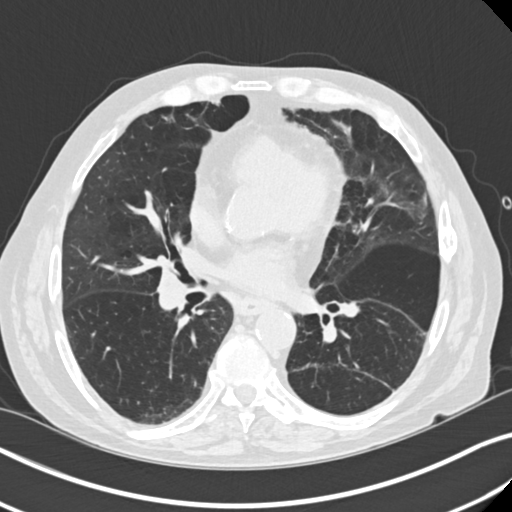
[im 93/173  lung]
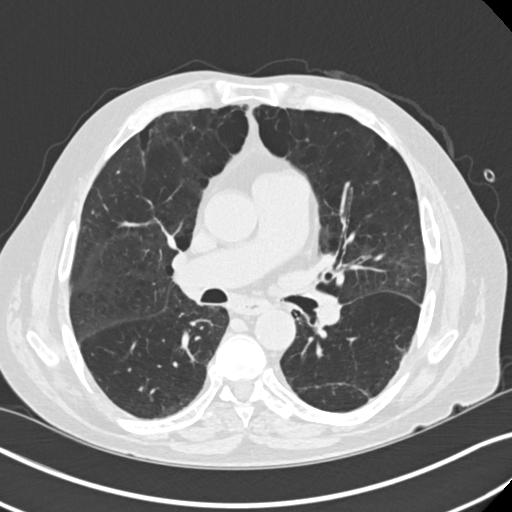
[im 106/173  lung]
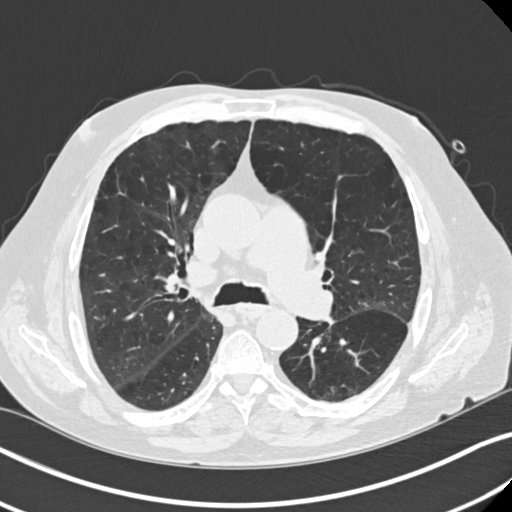
[im 120/173  mediastinal]
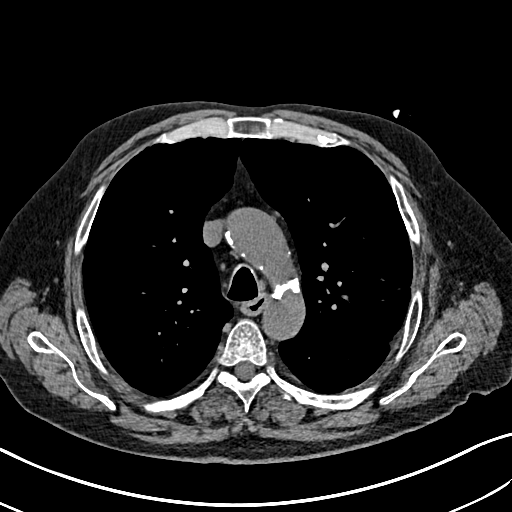
[im 120/173  lung]
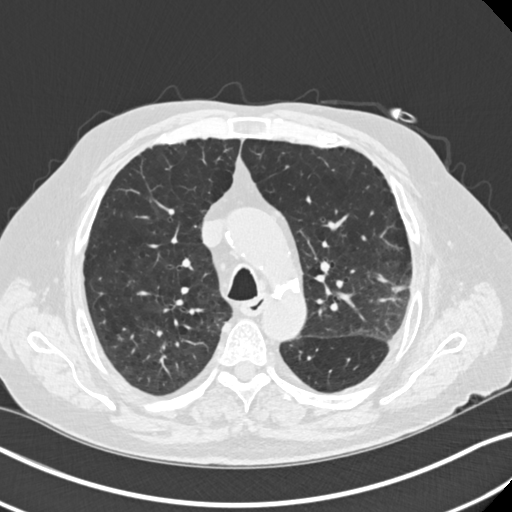
[im 133/173  lung]
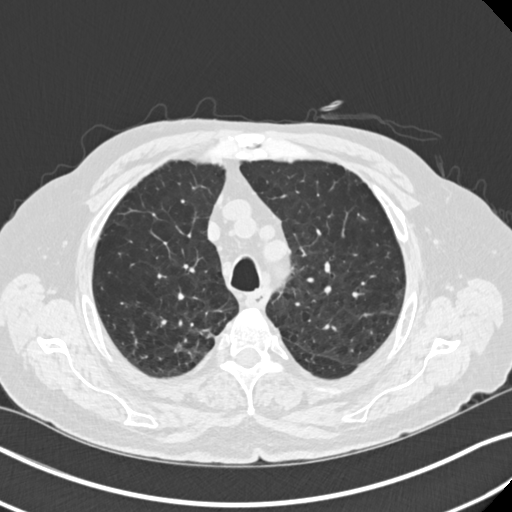
[im 146/173  lung]
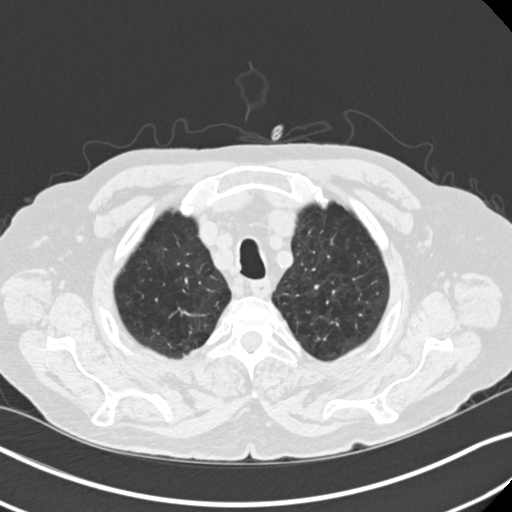
[im 159/173  lung]
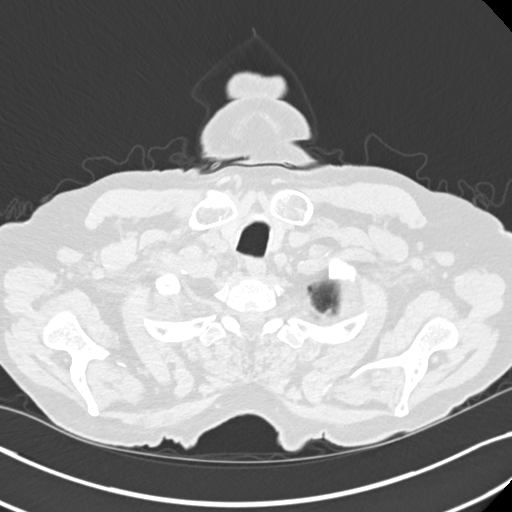

[Series 5: coronals chest 2.00 cor · coronal · 0.68mm/px · 3 of 161 slices shown]
[im 33/161  lung]
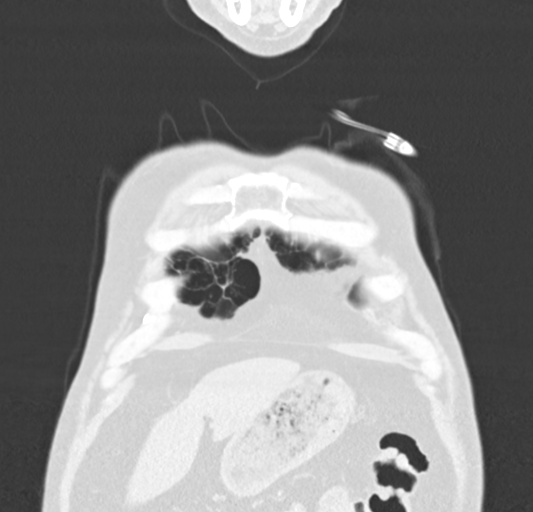
[im 65/161  lung]
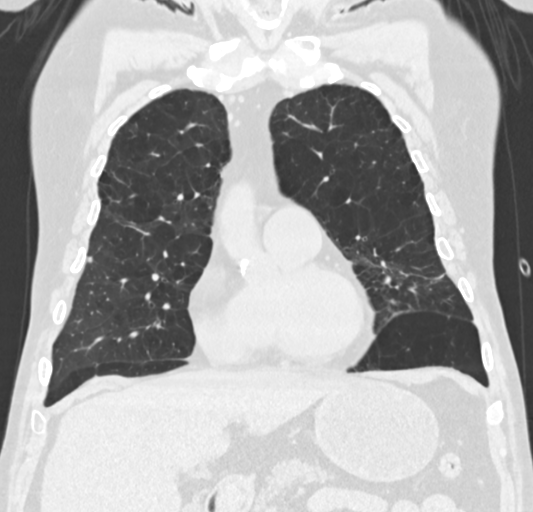
[im 97/161  lung]
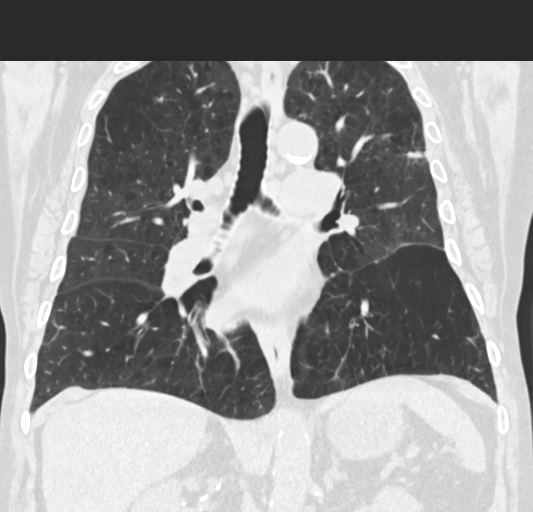

[15 of 36 positions shown; findings below may reference images not displayed]

FINDINGS: Cardiovascular: Aortic atherosclerosis. Normal heart size. Left
coronary artery calcifications. No pericardial effusion. Enlargement
of the main pulmonary artery, measuring up to 4.0 cm in caliber.

Mediastinum/Nodes: Numerous prominent subcentimeter lymph nodes
throughout the mediastinum and hila, unchanged. No pathologically
enlarged mediastinal, hilar, or axillary lymph nodes. Thyroid gland,
trachea, and esophagus demonstrate no significant findings.

Lungs/Pleura: Moderate centrilobular emphysema. Diffuse bilateral
bronchial wall thickening. Interval resolution of previously seen
dependent bilateral ground-glass and consolidative airspace disease
with minimal residual bandlike scarring of the dependent lung bases
and right upper lobe. No significant interval change in an irregular
nodule of the peripheral left upper lobe measuring 1.2 x 1.0 cm,
with some minimal developing adjacent ground-glass airspace opacity
and architectural distortion about the major fissure (series 3,
image 53). No significant interval change in additional adjacent
subpleural nodule of the peripheral left upper lobe measuring 4 mm
(series 3, image 39). No change in a 4 mm subpleural nodule of the
medial posterior right upper lobe (series 3, image 47). No pleural
effusion or pneumothorax.

Upper Abdomen: No acute abnormality.

Musculoskeletal: No chest wall mass or suspicious bone lesions
identified.
IMPRESSION: 1. No significant interval change in an irregular nodule of the
peripheral left upper lobe measuring 1.2 x 1.0 cm, with some minimal
developing adjacent ground-glass airspace opacity and architectural
distortion about the major fissure. Findings are consistent with
stable, previously FDG avid suspicious pulmonary nodule and early
developing radiation change.
2. Additional stable small nodules measuring 4 mm or smaller.
Attention on follow-up.
3. Moderate centrilobular emphysema
4. Diffuse bilateral bronchial wall thickening, consistent with
nonspecific infectious or inflammatory bronchitis.
5. Numerous prominent subcentimeter lymph nodes throughout the
mediastinum and hila, unchanged, likely reactive.
6. Coronary artery disease.
7. Enlargement of the main pulmonary artery, as can be seen in
pulmonary hypertension.

Aortic Atherosclerosis (VMQPS-FPJ.J) and Emphysema (VMQPS-ESN.N).

## 2021-11-19 IMAGING — DX DG CHEST 1V PORT
1 series · 1 of 1 positions shown · non-contrast
Comparison: Chest radiograph dated 01/29/2021 and CT dated
02/21/2021.

CLINICAL DATA: 65-year-old male with shortness of breath.

EXAM:
PORTABLE CHEST 1 VIEW

[chest ap]
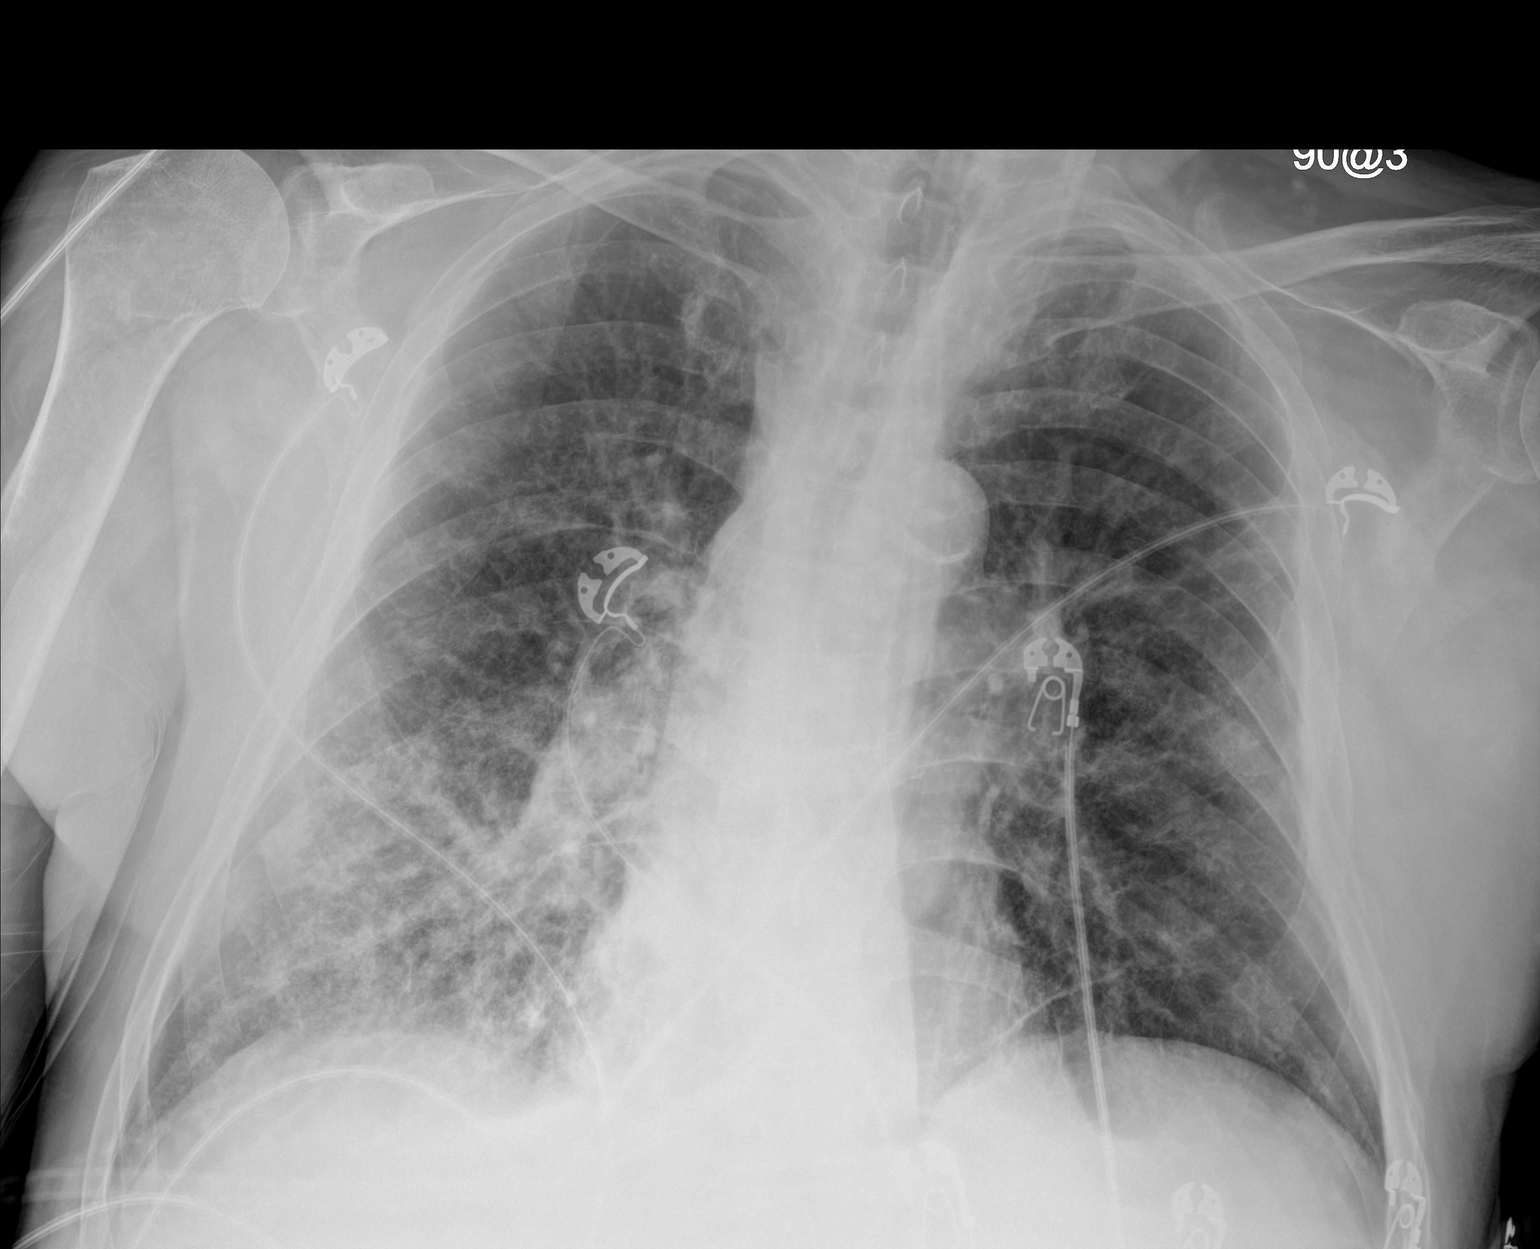

[1 of 1 positions shown; findings below may reference images not displayed]

FINDINGS: Background of emphysema. A 14 mm faintly visualized nodular focus in
the left mid lung field may correspond to the nodule seen on the
prior CT. There is diffuse chronic interstitial coarsening and
bronchitic changes. There is increased interstitial markings and
nodularity involving the right mid to lower lung field most
consistent with superimposed pneumonia. Clinical correlation and
follow-up to resolution recommended. No pleural effusion
pneumothorax. The cardiac silhouette is within limits.
Atherosclerotic calcification of the aorta. No acute osseous
pathology.
IMPRESSION: 1. Right mid to lower lung field pneumonia. Clinical correlation and
follow-up to resolution recommended.
2. Emphysema.

## 2021-11-20 IMAGING — DX DG CHEST 1V PORT
1 series · 1 of 1 positions shown · non-contrast
Comparison: 02/26/2021

CLINICAL DATA: Shortness of breath

EXAM:
PORTABLE CHEST 1 VIEW

[chest ap]
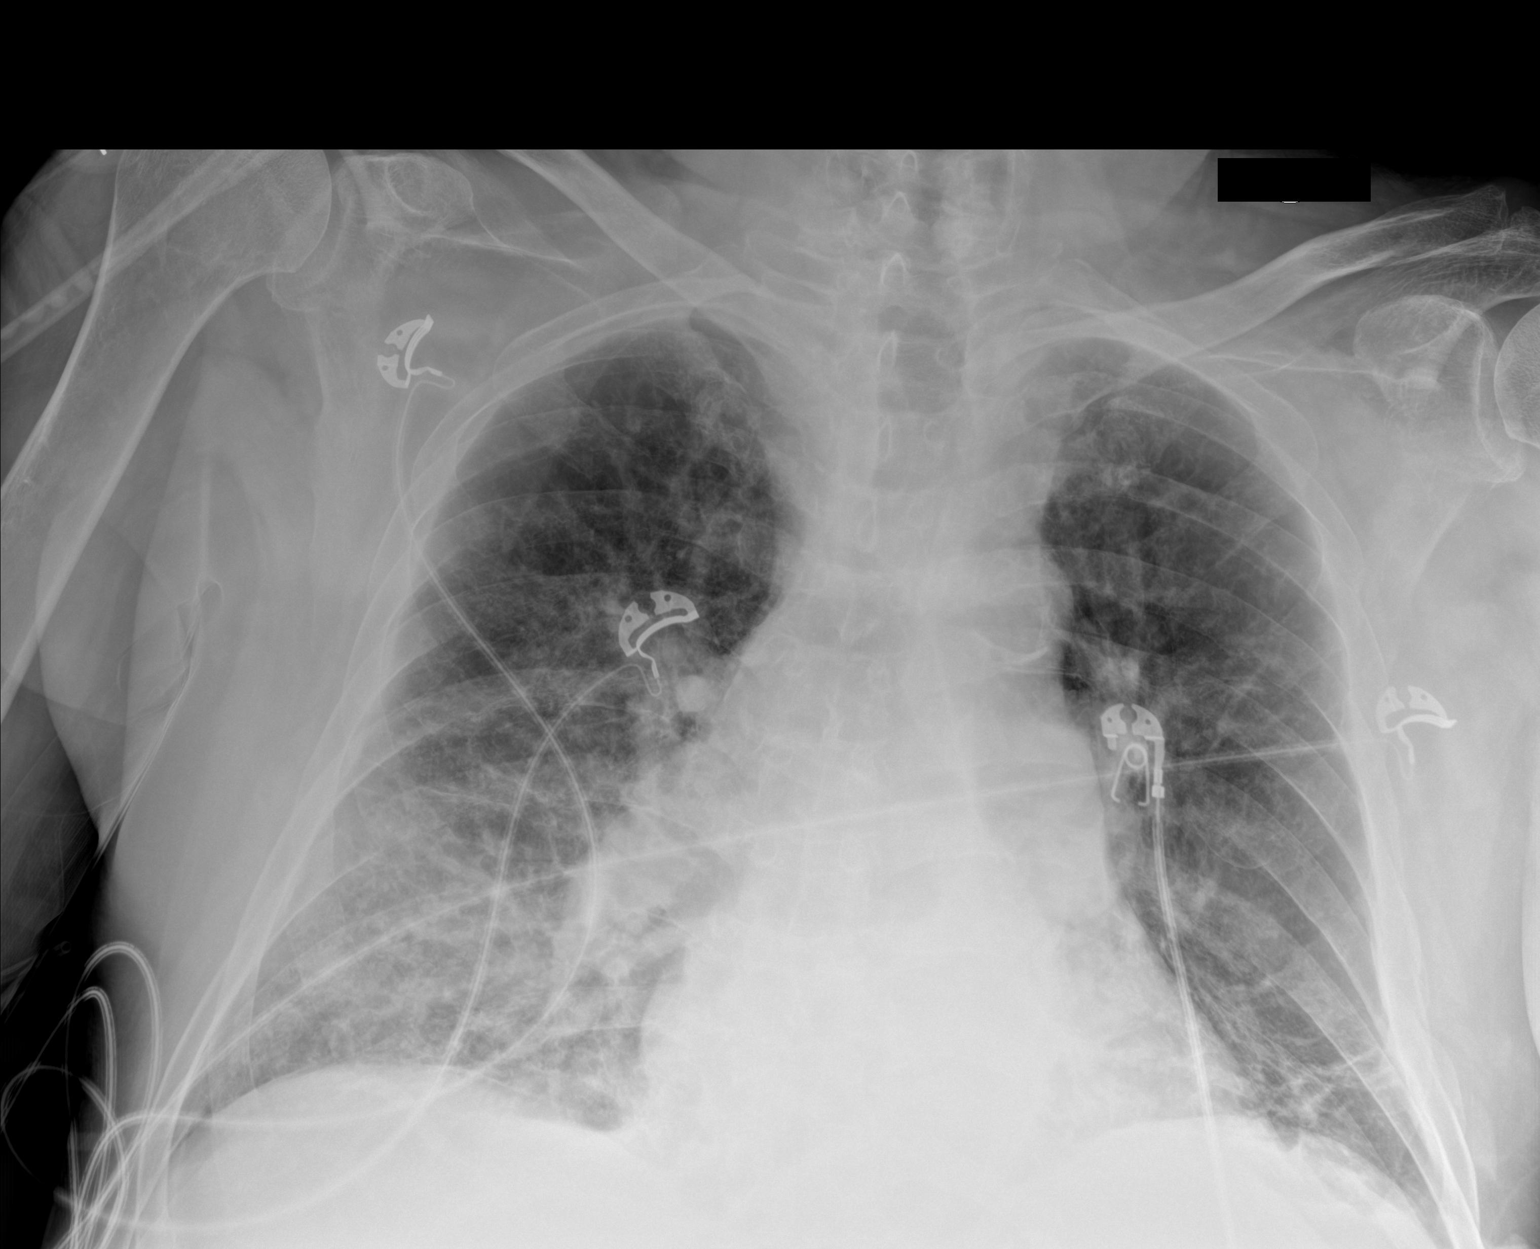

[1 of 1 positions shown; findings below may reference images not displayed]

FINDINGS: Unchanged right basilar opacities. Left basilar atelectasis has
worsened. There is calcific aortic atherosclerosis. No pleural
effusion.
IMPRESSION: Worsening left basilar atelectasis. Unchanged right basilar
opacities that may indicate infection.

## 2021-11-21 IMAGING — DX DG CHEST 1V PORT
1 series · 1 of 1 positions shown · non-contrast
Comparison: Chest x-ray 02/27/2021.  Chest CT 02/21/2021.

CLINICAL DATA: Respiratory failure.

EXAM:
PORTABLE CHEST 1 VIEW

[chest ap]
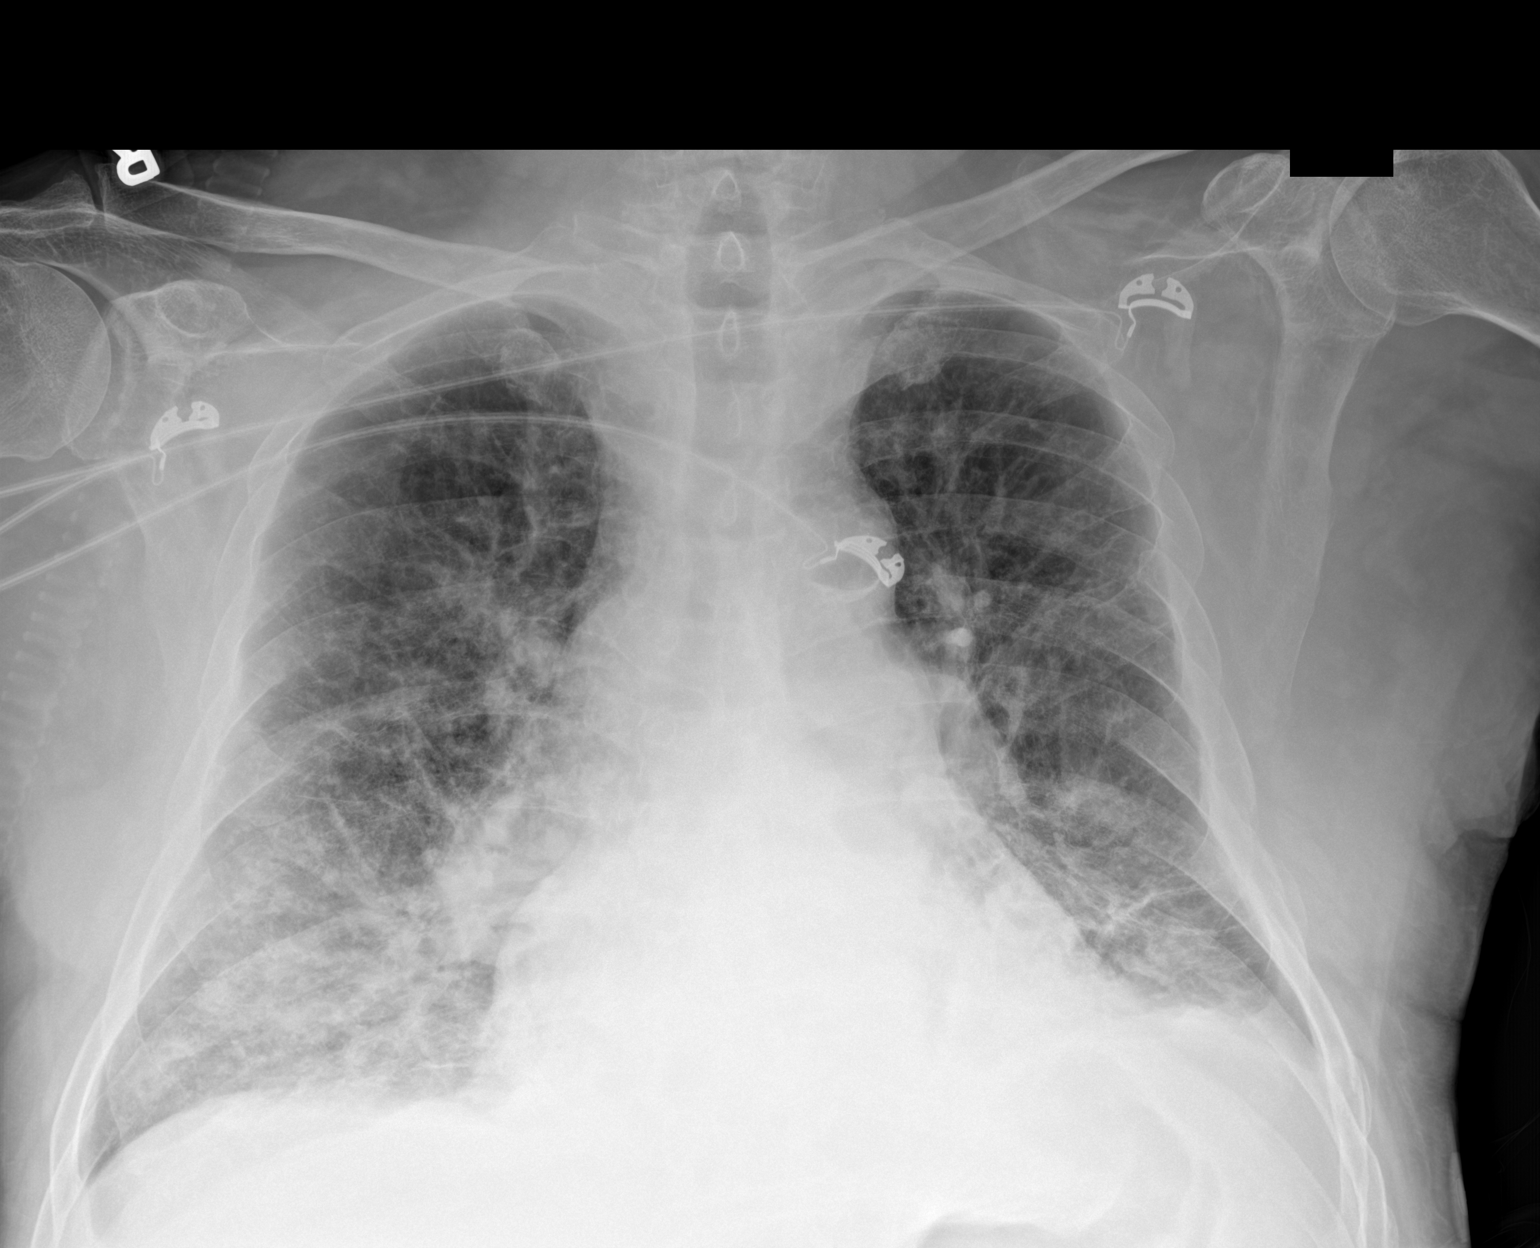

[1 of 1 positions shown; findings below may reference images not displayed]

FINDINGS: Heart size stable. Persistent low lung volumes with bibasilar
infiltrates/edema. No interim change. No pleural effusion or
pneumothorax. Reference is made to chest CT of 02/21/2021 for
discussion of pulmonary nodules present. Old left lower rib
fractures again noted.
IMPRESSION: 1. Persistent low lung volumes with bibasilar infiltrates/edema. No
interim change.

2. Reference is made to chest CT of 02/21/2021 for discussion of
pulmonary nodules present.

## 2021-11-22 ENCOUNTER — Telehealth: Payer: Self-pay | Admitting: Oncology

## 2021-11-22 NOTE — Telephone Encounter (Signed)
Caregiver called to reschedule pt appt for today. Call back at [phone]

## 2021-11-29 ENCOUNTER — Inpatient Hospital Stay: Payer: Medicare Other

## 2021-11-30 ENCOUNTER — Other Ambulatory Visit: Payer: Medicare Other

## 2021-11-30 ENCOUNTER — Other Ambulatory Visit: Payer: Medicare HMO

## 2021-12-07 ENCOUNTER — Inpatient Hospital Stay: Payer: Medicare Other | Attending: Oncology

## 2021-12-07 ENCOUNTER — Other Ambulatory Visit: Payer: Self-pay

## 2021-12-07 DIAGNOSIS — D509 Iron deficiency anemia, unspecified: Secondary | ICD-10-CM | POA: Insufficient documentation

## 2021-12-07 DIAGNOSIS — Z923 Personal history of irradiation: Secondary | ICD-10-CM | POA: Insufficient documentation

## 2021-12-07 DIAGNOSIS — Z85118 Personal history of other malignant neoplasm of bronchus and lung: Secondary | ICD-10-CM | POA: Insufficient documentation

## 2021-12-07 DIAGNOSIS — C3412 Malignant neoplasm of upper lobe, left bronchus or lung: Secondary | ICD-10-CM

## 2021-12-07 LAB — CBC WITH DIFFERENTIAL/PLATELET
Abs Immature Granulocytes: 0.07 10*3/uL (ref 0.00–0.07)
Basophils Absolute: 0.1 10*3/uL (ref 0.0–0.1)
Basophils Relative: 1 %
Eosinophils Absolute: 0.5 10*3/uL (ref 0.0–0.5)
Eosinophils Relative: 4 %
HCT: 37.2 % — ABNORMAL LOW (ref 39.0–52.0)
Hemoglobin: 11.9 g/dL — ABNORMAL LOW (ref 13.0–17.0)
Immature Granulocytes: 1 %
Lymphocytes Relative: 18 %
Lymphs Abs: 1.9 10*3/uL (ref 0.7–4.0)
MCH: 29.2 pg (ref 26.0–34.0)
MCHC: 32 g/dL (ref 30.0–36.0)
MCV: 91.4 fL (ref 80.0–100.0)
Monocytes Absolute: 0.7 10*3/uL (ref 0.1–1.0)
Monocytes Relative: 7 %
Neutro Abs: 7.7 10*3/uL (ref 1.7–7.7)
Neutrophils Relative %: 69 %
Platelets: 333 10*3/uL (ref 150–400)
RBC: 4.07 MIL/uL — ABNORMAL LOW (ref 4.22–5.81)
RDW: 14.4 % (ref 11.5–15.5)
WBC: 10.9 10*3/uL — ABNORMAL HIGH (ref 4.0–10.5)
nRBC: 0 % (ref 0.0–0.2)

## 2021-12-07 LAB — FERRITIN: Ferritin: 68 ng/mL (ref 24–336)

## 2021-12-07 LAB — IRON AND TIBC
Iron: 46 ug/dL (ref 45–182)
Saturation Ratios: 14 % — ABNORMAL LOW (ref 17.9–39.5)
TIBC: 335 ug/dL (ref 250–450)
UIBC: 289 ug/dL

## 2021-12-24 ENCOUNTER — Encounter: Payer: Self-pay | Admitting: Oncology

## 2021-12-30 NOTE — Progress Notes (Deleted)
No-show to second initial evaluation.

## 2021-12-31 ENCOUNTER — Other Ambulatory Visit: Payer: Self-pay | Admitting: Pain Medicine

## 2021-12-31 ENCOUNTER — Ambulatory Visit: Payer: Medicare Other | Admitting: Pain Medicine

## 2021-12-31 DIAGNOSIS — Z7901 Long term (current) use of anticoagulants: Secondary | ICD-10-CM | POA: Insufficient documentation

## 2021-12-31 DIAGNOSIS — M899 Disorder of bone, unspecified: Secondary | ICD-10-CM

## 2021-12-31 DIAGNOSIS — M5030 Other cervical disc degeneration, unspecified cervical region: Secondary | ICD-10-CM | POA: Insufficient documentation

## 2021-12-31 DIAGNOSIS — Z789 Other specified health status: Secondary | ICD-10-CM

## 2021-12-31 DIAGNOSIS — Z79899 Other long term (current) drug therapy: Secondary | ICD-10-CM | POA: Insufficient documentation

## 2021-12-31 DIAGNOSIS — Z981 Arthrodesis status: Secondary | ICD-10-CM | POA: Insufficient documentation

## 2021-12-31 DIAGNOSIS — G894 Chronic pain syndrome: Secondary | ICD-10-CM
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
## 2022-01-28 ENCOUNTER — Encounter: Payer: Self-pay | Admitting: Radiation Oncology

## 2022-01-28 ENCOUNTER — Ambulatory Visit
Admission: RE | Admit: 2022-01-28 | Discharge: 2022-01-28 | Disposition: A | Discharge status: Home / Self Care | Payer: 59 | Source: Ambulatory Visit | Attending: Radiation Oncology | Admitting: Radiation Oncology

## 2022-01-28 ENCOUNTER — Other Ambulatory Visit: Payer: Self-pay

## 2022-01-28 DIAGNOSIS — Z923 Personal history of irradiation: Secondary | ICD-10-CM | POA: Insufficient documentation

## 2022-01-28 DIAGNOSIS — Z9981 Dependence on supplemental oxygen: Secondary | ICD-10-CM | POA: Diagnosis not present

## 2022-01-28 DIAGNOSIS — C3412 Malignant neoplasm of upper lobe, left bronchus or lung: Secondary | ICD-10-CM | POA: Diagnosis not present

## 2022-01-28 NOTE — Progress Notes (Signed)
Radiation Oncology Follow up Note  Name: Christian Fields   Date:   01/28/2022 MRN:  [id number] DOB: November 12, 1956    This 66 y.o. male presents to the clinic today for 66-month follow-up status post SBRT to his left upper lobe for stage I non-small cell lung cancer.  REFERRING PROVIDER: Remi Haggard, FNP  HPI: Patient is a 66 year old male now out 15 months having completed SBRT to his left upper lobe for stage I lung cancer.  We have been following some residual nodularity in that region which was only mildly hypermetabolic on PET scan he is scheduled for a follow-up CT scan next month.  He is on continuous nasal oxygen has no cough hemoptysis or chest tightness.  He overall states he is doing well..  COMPLICATIONS OF TREATMENT: none  FOLLOW UP COMPLIANCE: keeps appointments   PHYSICAL EXAM:  BP (P) 131/76 (BP Location: Right Arm, Patient Position: Sitting)    Pulse (P) 83    Resp (P) 18    Wt (P) 161 lb 3.2 oz (73.1 kg)    SpO2 (!) (P) 83%    BMI (P) 24.51 kg/m  Frail-appearing male in NAD on nasal oxygen.  Well-developed well-nourished patient in NAD. HEENT reveals PERLA, EOMI, discs not visualized.  Oral cavity is clear. No oral mucosal lesions are identified. Neck is clear without evidence of cervical or supraclavicular adenopathy. Lungs are clear to A&P. Cardiac examination is essentially unremarkable with regular rate and rhythm without murmur rub or thrill. Abdomen is benign with no organomegaly or masses noted. Motor sensory and DTR levels are equal and symmetric in the upper and lower extremities. Cranial nerves II through XII are grossly intact. Proprioception is intact. No peripheral adenopathy or edema is identified. No motor or sensory levels are noted. Crude visual fields are within normal range.  RADIOLOGY RESULTS: CT scans reviewed we will follow-up on a CT scan next month.  Lesion appears stable over time and very mild hypermetabolic activity consistent with treatment  effect  PLAN: At this time we will continue to follow the patient have asked to see him back in 6 months for follow-up.  Should his CT scans demonstrate any progression in his left upper lobe region may offer salvage treatment empirically.  All this was explained to the patient.  He comprehends my recommendations well.  I would like to take this opportunity to thank you for allowing me to participate in the care of your patient.Noreene Filbert, MD

## 2022-02-20 ENCOUNTER — Other Ambulatory Visit: Payer: Self-pay | Admitting: *Deleted

## 2022-02-20 DIAGNOSIS — D509 Iron deficiency anemia, unspecified: Secondary | ICD-10-CM

## 2022-02-26 ENCOUNTER — Ambulatory Visit
Admission: RE | Admit: 2022-02-26 | Discharge: 2022-02-26 | Disposition: A | Discharge status: Home / Self Care | Payer: 59 | Source: Ambulatory Visit | Attending: Oncology | Admitting: Oncology

## 2022-02-26 ENCOUNTER — Other Ambulatory Visit: Payer: Self-pay

## 2022-02-26 DIAGNOSIS — C3412 Malignant neoplasm of upper lobe, left bronchus or lung: Secondary | ICD-10-CM | POA: Diagnosis present

## 2022-03-01 ENCOUNTER — Telehealth: Payer: Self-pay | Admitting: *Deleted

## 2022-03-01 ENCOUNTER — Inpatient Hospital Stay: Payer: Medicaid Other | Admitting: Oncology

## 2022-03-01 ENCOUNTER — Inpatient Hospital Stay: Payer: Medicaid Other

## 2022-03-01 NOTE — Telephone Encounter (Signed)
Incoming call from pt's daughter. Pt missed his apt today with Dr. Janese Banks and needs to r/s. Requesting a return phone call. ? ?I contacted daughter and offered to r/s the no show apt today. She only want the apts to be r/s with Dr. Janese Banks as her dad will be getting the ct scan results. Daughter requested an apt on Monday or Tuesday next week, but I did not see any availability. Daughter accepted an apt on 3/29 with labs at 1115 and apt with Dr. Janese Banks at [phone]. I explained that if Dr. Janese Banks gets a cnl we could call her back and see if a sooner apt is available. Daughter gave verbal understanding. ? ? ? ?Daughter concerned at the ct scan results. Dr. Janese Banks- FYI. If you need to see patient sooner, please let scheduling team know. Thanks- Jonne Ply, RN ? ? ?IMPRESSION: ?1. Severe pulmonary emphysema. ?2. Decreased size of LEFT upper lobe nodule with signs of post ?treatment change. ?3. LEFT lower lobe pulmonary nodule in the medial LEFT ?costodiaphragmatic sulcus and is new compared to previous imaging. ?This raises the question of metastasis. Consider short interval ?follow-up or PET imaging as warranted. ?4. New T5 compression fracture. Age indeterminate, this has occurred ?in the interval since October of 2022. Correlate with any pain in ?this area to help determine chronicity. ?5. Multiple healed rib fractures. ?

## 2022-03-06 ENCOUNTER — Inpatient Hospital Stay: Payer: 59 | Attending: Oncology

## 2022-03-06 ENCOUNTER — Other Ambulatory Visit: Payer: Self-pay

## 2022-03-06 ENCOUNTER — Encounter: Payer: Self-pay | Admitting: Oncology

## 2022-03-06 ENCOUNTER — Inpatient Hospital Stay (HOSPITAL_BASED_OUTPATIENT_CLINIC_OR_DEPARTMENT_OTHER): Payer: 59 | Admitting: Oncology

## 2022-03-06 VITALS — BP 119/80 | HR 80 | Resp 18 | Wt 158.4 lb

## 2022-03-06 DIAGNOSIS — I5032 Chronic diastolic (congestive) heart failure: Secondary | ICD-10-CM | POA: Diagnosis not present

## 2022-03-06 DIAGNOSIS — Z85118 Personal history of other malignant neoplasm of bronchus and lung: Secondary | ICD-10-CM

## 2022-03-06 DIAGNOSIS — Z923 Personal history of irradiation: Secondary | ICD-10-CM | POA: Insufficient documentation

## 2022-03-06 DIAGNOSIS — D509 Iron deficiency anemia, unspecified: Secondary | ICD-10-CM

## 2022-03-06 DIAGNOSIS — D649 Anemia, unspecified: Secondary | ICD-10-CM | POA: Insufficient documentation

## 2022-03-06 DIAGNOSIS — R918 Other nonspecific abnormal finding of lung field: Secondary | ICD-10-CM | POA: Insufficient documentation

## 2022-03-06 DIAGNOSIS — Z08 Encounter for follow-up examination after completed treatment for malignant neoplasm: Secondary | ICD-10-CM

## 2022-03-06 DIAGNOSIS — C3412 Malignant neoplasm of upper lobe, left bronchus or lung: Secondary | ICD-10-CM

## 2022-03-06 LAB — FERRITIN: Ferritin: 88 ng/mL (ref 24–336)

## 2022-03-06 LAB — CBC WITH DIFFERENTIAL/PLATELET
Abs Immature Granulocytes: 0.25 10*3/uL — ABNORMAL HIGH (ref 0.00–0.07)
Basophils Absolute: 0.1 10*3/uL (ref 0.0–0.1)
Basophils Relative: 1 %
Eosinophils Absolute: 0.6 10*3/uL — ABNORMAL HIGH (ref 0.0–0.5)
Eosinophils Relative: 5 %
HCT: 38.4 % — ABNORMAL LOW (ref 39.0–52.0)
Hemoglobin: 11.5 g/dL — ABNORMAL LOW (ref 13.0–17.0)
Immature Granulocytes: 2 %
Lymphocytes Relative: 23 %
Lymphs Abs: 2.6 10*3/uL (ref 0.7–4.0)
MCH: 27.9 pg (ref 26.0–34.0)
MCHC: 29.9 g/dL — ABNORMAL LOW (ref 30.0–36.0)
MCV: 93.2 fL (ref 80.0–100.0)
Monocytes Absolute: 0.7 10*3/uL (ref 0.1–1.0)
Monocytes Relative: 6 %
Neutro Abs: 7.2 10*3/uL (ref 1.7–7.7)
Neutrophils Relative %: 63 %
Platelets: 389 10*3/uL (ref 150–400)
RBC: 4.12 MIL/uL — ABNORMAL LOW (ref 4.22–5.81)
RDW: 13.7 % (ref 11.5–15.5)
WBC: 11.5 10*3/uL — ABNORMAL HIGH (ref 4.0–10.5)
nRBC: 0 % (ref 0.0–0.2)

## 2022-03-06 LAB — IRON AND TIBC
Iron: 31 ug/dL — ABNORMAL LOW (ref 45–182)
Saturation Ratios: 11 % — ABNORMAL LOW (ref 17.9–39.5)
TIBC: 295 ug/dL (ref 250–450)
UIBC: 264 ug/dL

## 2022-03-06 NOTE — Progress Notes (Signed)
Hematology/Oncology Consult note [facility]  Telephone:([phone] Fax:([phone]  Patient Care Team: Ziglar, Lincoln Brigham, MD as PCP - General (Family Medicine) Telford Nab, RN as Oncology Nurse Navigator Noreene Filbert, MD as Referring Physician (Radiation Oncology) Sindy Guadeloupe, MD as Consulting Physician (Hematology and Oncology)   Name of the patient: Christian Fields  [id number]  02/16/1956   Date of visit: 03/06/22  Diagnosis- stage I lung cancer s/p radiation Normocytic anemia  Chief complaint/ Reason for visit-routine follow-up of lung cancer  Heme/Onc history: Patient is a 66 year old male who was admitted to the hospital in April 2021 for community-acquired pneumonia and as a part of the work-up he had a CT angio chest done which incidentally showed a 1 cm irregular left upper lobe lung nodule repeat CT scan in August 2021 showed an interval increase in the size from 1 to 1.4 cm.  No appreciable thoracic adenopathy.  Severe centrilobular emphysema.  Chronic pulmonary hypertension.  Patient has been seen by Dr. Raul Del and has been considered to be high risk for biopsy from their standpoint.  He underwent SBRT to the left upper lobe lung lesion.  Patient now referred for anemia   Most recent labs from September 2021 showed a normal liver and kidney function.  TSH was normal.  Vitamin D levels were low.  CBC showed white count of 12.6, H&H of 10.9/37 with an MCV of 102 and a platelet count of 358.   Anemia work-up from 10/27/2020 showed a mildly low ferritin level of 28 with an iron saturation of 8% myeloma panel, serum free light chains, haptoglobin, TSH and LDH was normal.  B12 levels were low at 166.  Interval history-he has severe COPD at baseline.  Last hospitalization was sometime in August 2022. He is on 2 L of oxygen at home.  ECOG PS- 2 Pain scale- 0   Review of systems- Review of Systems  Constitutional:  Positive for malaise/fatigue.  Negative for chills, fever and weight loss.  HENT:  Negative for congestion, ear discharge and nosebleeds.   Eyes:  Negative for blurred vision.  Respiratory:  Positive for shortness of breath. Negative for cough, hemoptysis, sputum production and wheezing.   Cardiovascular:  Negative for chest pain, palpitations, orthopnea and claudication.  Gastrointestinal:  Negative for abdominal pain, blood in stool, constipation, diarrhea, heartburn, melena, nausea and vomiting.  Genitourinary:  Negative for dysuria, flank pain, frequency, hematuria and urgency.  Musculoskeletal:  Negative for back pain, joint pain and myalgias.  Skin:  Negative for rash.  Neurological:  Negative for dizziness, tingling, focal weakness, seizures, weakness and headaches.  Endo/Heme/Allergies:  Does not bruise/bleed easily.  Psychiatric/Behavioral:  Negative for depression and suicidal ideas. The patient does not have insomnia.      Allergies  Allergen Reactions   Bee Venom Anaphylaxis     Past Medical History:  Diagnosis Date   Anemia    Anxiety    Arthritis    Asthma    Cancer ([place])    Basal Cell Skin Cancer   Chronic back pain    COPD (chronic obstructive pulmonary disease) (HCC)    Depression    Diabetes mellitus (HCC)    Dyspnea    GERD (gastroesophageal reflux disease)    Gout    Gout    Headache    History of blood clots    Left Leg--July 2018   History of kidney stones    Hyperlipidemia    Hyperlipidemia  Hypertension    Kidney stones    Neuropathy    On home oxygen therapy    2 L / M   Pneumonia 06/2017   Sleep apnea    Ulcer of foot ([place])    Right     Past Surgical History:  Procedure Laterality Date   APPENDECTOMY     DG FEET 2 VIEWS BILAT     LIPOMA EXCISION Right 08/15/2017   Procedure: EXCISION TUMOR(CYST) FOOT;  Surgeon: Albertine Patricia, DPM;  Location: [facility] ORS;  Service: Podiatry;  Laterality: Right;   OTHER SURGICAL HISTORY Bilateral Foot surgery    Social History    Socioeconomic History   Marital status: Widowed    Spouse name: Not on file   Number of children: Not on file   Years of education: Not on file   Highest education level: Not on file  Occupational History   Not on file  Tobacco Use   Smoking status: Former    Packs/day: 0.50    Types: Cigarettes    Start date: 2018   Smokeless tobacco: Never  Vaping Use   Vaping Use: Never used  Substance and Sexual Activity   Alcohol use: Yes    Alcohol/week: 1.0 standard drink    Types: 1 Cans of beer per week    Comment: occ   Drug use: No   Sexual activity: Not Currently  Other Topics Concern   Not on file  Social History Narrative   Not on file   Social Determinants of Health   Financial Resource Strain: Not on file  Food Insecurity: Not on file  Transportation Needs: Not on file  Physical Activity: Not on file  Stress: Not on file  Social Connections: Not on file  Intimate Partner Violence: Not on file    Family History  Problem Relation Age of Onset   Hypertension Mother      Current Outpatient Medications:    albuterol (VENTOLIN HFA) 108 (90 Base) MCG/ACT inhaler, Inhale 2 puffs into the lungs every 4 (four) hours as needed for wheezing or shortness of breath., Disp: , Rfl:    allopurinol (ZYLOPRIM) 300 MG tablet, Take 600 mg by mouth daily., Disp: , Rfl:    aspirin EC 81 MG tablet, Take 1 tablet by mouth daily., Disp: , Rfl:    clopidogrel (PLAVIX) 75 MG tablet, Take 1 tablet (75 mg total) by mouth daily., Disp: 30 tablet, Rfl: 1   DULoxetine (CYMBALTA) 30 MG capsule, Take 1 capsule by mouth daily., Disp: , Rfl:    ezetimibe (ZETIA) 10 MG tablet, Take 10 mg by mouth daily., Disp: , Rfl:    FARXIGA 10 MG TABS tablet, Take 10 mg by mouth daily., Disp: , Rfl:    Fluticasone-Umeclidin-Vilant (TRELEGY ELLIPTA) 100-62.5-25 MCG/ACT AEPB, Inhale into the lungs., Disp: , Rfl:    gabapentin (NEURONTIN) 800 MG tablet, Take 800 mg by mouth 4 (four) times daily., Disp: , Rfl:     metFORMIN (GLUCOPHAGE) 1000 MG tablet, Take 1,000 mg by mouth 2 (two) times daily with a meal., Disp: , Rfl:    metoprolol succinate (TOPROL-XL) 50 MG 24 hr tablet, Take 50 mg by mouth daily., Disp: , Rfl:    OXYGEN, Inhale 2 L into the lungs., Disp: , Rfl:    pantoprazole (PROTONIX) 40 MG tablet, Take 40 mg by mouth daily., Disp: , Rfl:    roflumilast (DALIRESP) 500 MCG TABS tablet, Take by mouth., Disp: , Rfl:    simvastatin (ZOCOR) 40 MG  tablet, Take 1 tablet by mouth daily., Disp: , Rfl:    buprenorphine-naloxone (SUBOXONE) 8-2 mg SUBL SL tablet, Place 1 tablet under the tongue in the morning, at noon, and at bedtime., Disp: , Rfl:    cholecalciferol (VITAMIN D) 1000 units tablet, Take 1,000 Units by mouth daily. (Patient not taking: Reported on 03/06/2022), Disp: , Rfl:    clonazePAM (KLONOPIN) 1 MG tablet, Take 1 mg by mouth 2 (two) times daily as needed for anxiety. (Patient not taking: Reported on 01/28/2022), Disp: , Rfl:    hydrOXYzine (ATARAX) 25 MG tablet, Take 25-50 mg by mouth 3 (three) times daily as needed. (Patient not taking: Reported on 03/06/2022), Disp: , Rfl:   Physical exam:  Vitals:   03/06/22 1153  BP: 119/80  Pulse: 80  Resp: 18  SpO2: 91%  Weight: 158 lb 6.4 oz (71.8 kg)   Physical Exam Cardiovascular:     Rate and Rhythm: Normal rate and regular rhythm.     Heart sounds: Normal heart sounds.  Pulmonary:     Effort: Pulmonary effort is normal.     Comments: Breath sounds decreased bilaterally diffusely Skin:    General: Skin is warm and dry.  Neurological:     Mental Status: He is alert and oriented to person, place, and time.        Latest Ref Rng & Units 08/31/2021   11:36 AM  CMP  Glucose 70 - 99 mg/dL 126    BUN 8 - 23 mg/dL 17    Creatinine 0.61 - 1.24 mg/dL 1.20    Sodium 135 - 145 mmol/L 137    Potassium 3.5 - 5.1 mmol/L 4.4    Chloride 98 - 111 mmol/L 99    CO2 22 - 32 mmol/L 28    Calcium 8.9 - 10.3 mg/dL 8.9    Total Protein 6.5 - 8.1 g/dL  7.2    Total Bilirubin 0.3 - 1.2 mg/dL 0.7    Alkaline Phos 38 - 126 U/L 102    AST 15 - 41 U/L 21    ALT 0 - 44 U/L 10        Latest Ref Rng & Units 03/06/2022   11:38 AM  CBC  WBC 4.0 - 10.5 K/uL 11.5    Hemoglobin 13.0 - 17.0 g/dL 11.5    Hematocrit 39.0 - 52.0 % 38.4    Platelets 150 - 400 K/uL 389      No images are attached to the encounter.  CT Chest Wo Contrast  Result Date: 02/27/2022 CLINICAL DATA:  A 66 year old male presents for evaluation of LEFT upper lobe mass post radiotherapy. * Tracking Code: BO * EXAM: CT CHEST WITHOUT CONTRAST TECHNIQUE: Multidetector CT imaging of the chest was performed following the standard protocol without IV contrast. RADIATION DOSE REDUCTION: This exam was performed according to the departmental dose-optimization program which includes automated exposure control, adjustment of the mA and/or kV according to patient size and/or use of iterative reconstruction technique. COMPARISON:  August 29, 2021. FINDINGS: Cardiovascular: Calcified atheromatous plaque of the thoracic aorta. No aneurysmal dilation. 3.6 cm caliber of the main pulmonary artery. Normal heart size. No pericardial effusion. Mediastinum/Nodes: No thoracic inlet, axillary, mediastinal or hilar adenopathy. Esophagus grossly normal. Scattered small lymph nodes display no change from previous imaging. Lungs/Pleura: Severe pulmonary emphysema. No lobar consolidation or sign of pleural effusion. LEFT upper lobe nodules situated along the pleural surface is surrounded by ground-glass and measures 18 x 18 mm greatest axial dimension as  compared to 26 x 27 mm on the previous exam. Fissural distortion remains evident. LEFT lower lobe pulmonary nodule (image 127/3) this is in the medial LEFT costodiaphragmatic sulcus and is new compared to previous imaging. (Image 48/3) 3 mm RIGHT upper lobe pulmonary nodule is stable. Upper Abdomen: Incidental imaging of upper abdominal contents without acute  process. Musculoskeletal: No destructive bone process. Spinal degenerative changes. Multiple healed RIGHT rib fractures with continued healing involving RIGHT anterolateral ribs 3 through 7. Also with healing of anterolateral ribs on the LEFT involving LEFT ribs 2 through 8. T5 compression fracture along inferior endplate shows 35-57% loss of height without retropulsion or change in spinal alignment and is new compared to imaging from October and September of 2022. IMPRESSION: 1. Severe pulmonary emphysema. 2. Decreased size of LEFT upper lobe nodule with signs of post treatment change. 3. LEFT lower lobe pulmonary nodule in the medial LEFT costodiaphragmatic sulcus and is new compared to previous imaging. This raises the question of metastasis. Consider short interval follow-up or PET imaging as warranted. 4. New T5 compression fracture. Age indeterminate, this has occurred in the interval since October of 2022. Correlate with any pain in this area to help determine chronicity. 5. Multiple healed rib fractures. Aortic Atherosclerosis (ICD10-I70.0) and Emphysema (ICD10-J43.9). Electronically Signed   By: Zetta Bills M.D.   On: 02/27/2022 12:22     Assessment and plan- Patient is a 66 y.o. male  with history of stage I left upper lobe lung cancer s/p SBRT.  He is here to discuss CT scan results and further management  I have reviewed CT chest images independently and discussed findings with the patient which shows overall stable left upper lobe findings where he has had radiation in the past.  However there is a new right lower lobe lesion which is new and warrants surveillance.  I will plan to get a repeat CT chest without contrast in 3 months and see him thereafter.     Visit Diagnosis 1. Encounter for follow-up surveillance of lung cancer   2. Chronic diastolic CHF (congestive heart failure) (HCC)      Dr. Randa Evens, MD, MPH [facility] at [facility] [phone] 03/06/2022 1:43  PM

## 2022-05-22 ENCOUNTER — Encounter: Payer: Self-pay | Admitting: Oncology

## 2022-06-04 ENCOUNTER — Ambulatory Visit: Payer: Medicaid Other | Admitting: Oncology

## 2022-06-06 ENCOUNTER — Ambulatory Visit: Payer: 59

## 2022-06-07 ENCOUNTER — Ambulatory Visit
Admission: RE | Admit: 2022-06-07 | Discharge: 2022-06-07 | Disposition: A | Discharge status: Home / Self Care | Payer: 59 | Source: Ambulatory Visit | Attending: Oncology | Admitting: Oncology

## 2022-06-07 DIAGNOSIS — Z85118 Personal history of other malignant neoplasm of bronchus and lung: Secondary | ICD-10-CM | POA: Insufficient documentation

## 2022-06-07 DIAGNOSIS — Z08 Encounter for follow-up examination after completed treatment for malignant neoplasm: Secondary | ICD-10-CM | POA: Insufficient documentation

## 2022-06-10 ENCOUNTER — Inpatient Hospital Stay: Payer: 59 | Attending: Oncology | Admitting: Oncology

## 2022-06-10 ENCOUNTER — Ambulatory Visit: Payer: Medicaid Other | Admitting: Oncology

## 2022-06-10 ENCOUNTER — Encounter: Payer: Self-pay | Admitting: Oncology

## 2022-06-10 ENCOUNTER — Inpatient Hospital Stay: Payer: 59

## 2022-06-10 VITALS — BP 113/84 | HR 103 | Temp 96.4°F | Resp 18 | Ht 68.0 in | Wt 154.0 lb

## 2022-06-10 DIAGNOSIS — Z7984 Long term (current) use of oral hypoglycemic drugs: Secondary | ICD-10-CM | POA: Diagnosis not present

## 2022-06-10 DIAGNOSIS — D509 Iron deficiency anemia, unspecified: Secondary | ICD-10-CM | POA: Diagnosis present

## 2022-06-10 DIAGNOSIS — Z85118 Personal history of other malignant neoplasm of bronchus and lung: Secondary | ICD-10-CM | POA: Diagnosis present

## 2022-06-10 DIAGNOSIS — L03032 Cellulitis of left toe: Secondary | ICD-10-CM | POA: Insufficient documentation

## 2022-06-10 DIAGNOSIS — Z923 Personal history of irradiation: Secondary | ICD-10-CM | POA: Insufficient documentation

## 2022-06-10 DIAGNOSIS — Z7982 Long term (current) use of aspirin: Secondary | ICD-10-CM | POA: Diagnosis not present

## 2022-06-10 DIAGNOSIS — Z79899 Other long term (current) drug therapy: Secondary | ICD-10-CM | POA: Insufficient documentation

## 2022-06-10 DIAGNOSIS — C3412 Malignant neoplasm of upper lobe, left bronchus or lung: Secondary | ICD-10-CM

## 2022-06-10 LAB — IRON AND TIBC
Iron: 20 ug/dL — ABNORMAL LOW (ref 45–182)
Saturation Ratios: 6 % — ABNORMAL LOW (ref 17.9–39.5)
TIBC: 349 ug/dL (ref 250–450)
UIBC: 329 ug/dL

## 2022-06-10 LAB — CBC WITH DIFFERENTIAL/PLATELET
Abs Immature Granulocytes: 0.05 10*3/uL (ref 0.00–0.07)
Basophils Absolute: 0.1 10*3/uL (ref 0.0–0.1)
Basophils Relative: 1 %
Eosinophils Absolute: 0.3 10*3/uL (ref 0.0–0.5)
Eosinophils Relative: 3 %
HCT: 34.4 % — ABNORMAL LOW (ref 39.0–52.0)
Hemoglobin: 10.5 g/dL — ABNORMAL LOW (ref 13.0–17.0)
Immature Granulocytes: 0 %
Lymphocytes Relative: 18 %
Lymphs Abs: 2.1 10*3/uL (ref 0.7–4.0)
MCH: 27.4 pg (ref 26.0–34.0)
MCHC: 30.5 g/dL (ref 30.0–36.0)
MCV: 89.8 fL (ref 80.0–100.0)
Monocytes Absolute: 0.8 10*3/uL (ref 0.1–1.0)
Monocytes Relative: 7 %
Neutro Abs: 8.5 10*3/uL — ABNORMAL HIGH (ref 1.7–7.7)
Neutrophils Relative %: 71 %
Platelets: 279 10*3/uL (ref 150–400)
RBC: 3.83 MIL/uL — ABNORMAL LOW (ref 4.22–5.81)
RDW: 15.2 % (ref 11.5–15.5)
WBC: 11.9 10*3/uL — ABNORMAL HIGH (ref 4.0–10.5)
nRBC: 0 % (ref 0.0–0.2)

## 2022-06-10 LAB — FOLATE: Folate: 25 ng/mL (ref >5.9)

## 2022-06-10 LAB — FERRITIN: Ferritin: 55 ng/mL (ref 24–336)

## 2022-06-10 LAB — VITAMIN B12: Vitamin B-12: 249 pg/mL (ref 180–914)

## 2022-06-10 MED ORDER — DOXYCYCLINE HYCLATE 100 MG PO TABS: 100.0000 mg | ORAL_TABLET | Freq: Two times a day (BID) | ORAL | 0 refills | Status: DC

## 2022-06-10 NOTE — Progress Notes (Signed)
Hematology/Oncology Consult note [facility]  [phone] [phone]  Patient Care Team: Ziglar, Lincoln Brigham, MD as PCP - General (Family Medicine) Telford Nab, RN as Oncology Nurse Navigator Noreene Filbert, MD as Referring Physician (Radiation Oncology) Sindy Guadeloupe, MD as Consulting Physician (Hematology and Oncology)   Name of the patient: Christian Fields  [id number]  May 11, 1956   Date of visit: 06/10/22  Diagnosis- stage I lung cancer s/p radiation Normocytic anemia  Chief complaint/ Reason for visit-discuss CT scan results and further management  Heme/Onc history:  Patient is a 66 year old male who was admitted to the hospital in April 2021 for community-acquired pneumonia and as a part of the work-up he had a CT angio chest done which incidentally showed a 1 cm irregular left upper lobe lung nodule repeat CT scan in August 2021 showed an interval increase in the size from 1 to 1.4 cm.  No appreciable thoracic adenopathy.  Severe centrilobular emphysema.  Chronic pulmonary hypertension.  Patient has been seen by Dr. Raul Del and has been considered to be high risk for biopsy from their standpoint.  He underwent SBRT to the left upper lobe lung lesion.   Labs from September 2021 showed a normal liver and kidney function.  TSH was normal.  Vitamin D levels were low.  CBC showed white count of 12.6, H&H of 10.9/37 with an MCV of 102 and a platelet count of 358.   Anemia work-up from 10/27/2020 showed a mildly low ferritin level of 28 with an iron saturation of 8% myeloma panel, serum free light chains, haptoglobin, TSH and LDH was normal.  B12 levels were low at 166.  Interval history-he has a diabetic foot and does develop ulcerations in his foot.  Presently complains of pain and swelling in his left big toe for the last few days and he is concerned about worsening infection.  Denies any fever.  Has baseline fatigue and exertional shortness of  breath  ECOG PS- 2 Pain scale- 3 Opioid associated constipation- no  Review of systems- Review of Systems  Constitutional:  Positive for malaise/fatigue. Negative for chills, fever and weight loss.  HENT:  Negative for congestion, ear discharge and nosebleeds.   Eyes:  Negative for blurred vision.  Respiratory:  Positive for shortness of breath. Negative for cough, hemoptysis, sputum production and wheezing.   Cardiovascular:  Negative for chest pain, palpitations, orthopnea and claudication.  Gastrointestinal:  Negative for abdominal pain, blood in stool, constipation, diarrhea, heartburn, melena, nausea and vomiting.  Genitourinary:  Negative for dysuria, flank pain, frequency, hematuria and urgency.  Musculoskeletal:  Negative for back pain, joint pain and myalgias.       Pain in the left big toe  Skin:  Negative for rash.  Neurological:  Negative for dizziness, tingling, focal weakness, seizures, weakness and headaches.  Endo/Heme/Allergies:  Does not bruise/bleed easily.  Psychiatric/Behavioral:  Negative for depression and suicidal ideas. The patient does not have insomnia.       Allergies  Allergen Reactions   Bee Venom Anaphylaxis     Past Medical History:  Diagnosis Date   Anemia    Anxiety    Arthritis    Asthma    Cancer ([place])    Basal Cell Skin Cancer   Chronic back pain    COPD (chronic obstructive pulmonary disease) (HCC)    Depression    Diabetes mellitus (HCC)    Dyspnea    GERD (gastroesophageal reflux disease)    Gout  Gout    Headache    History of blood clots    Left Leg--July 2018   History of kidney stones    Hyperlipidemia    Hyperlipidemia    Hypertension    Kidney stones    Neuropathy    On home oxygen therapy    2 L / M   Pneumonia 06/2017   Sleep apnea    Ulcer of foot ([place])    Right     Past Surgical History:  Procedure Laterality Date   APPENDECTOMY     DG FEET 2 VIEWS BILAT     LIPOMA EXCISION Right 08/15/2017    Procedure: EXCISION TUMOR(CYST) FOOT;  Surgeon: Albertine Patricia, DPM;  Location: [facility] ORS;  Service: Podiatry;  Laterality: Right;   OTHER SURGICAL HISTORY Bilateral Foot surgery    Social History   Socioeconomic History   Marital status: Widowed    Spouse name: Not on file   Number of children: Not on file   Years of education: Not on file   Highest education level: Not on file  Occupational History   Not on file  Tobacco Use   Smoking status: Former    Packs/day: 0.50    Types: Cigarettes    Start date: 2018   Smokeless tobacco: Never  Vaping Use   Vaping Use: Never used  Substance and Sexual Activity   Alcohol use: Yes    Alcohol/week: 1.0 standard drink of alcohol    Types: 1 Cans of beer per week    Comment: occ   Drug use: No   Sexual activity: Not Currently  Other Topics Concern   Not on file  Social History Narrative   Not on file   Social Determinants of Health   Financial Resource Strain: Not on file  Food Insecurity: Not on file  Transportation Needs: Not on file  Physical Activity: Not on file  Stress: Not on file  Social Connections: Not on file  Intimate Partner Violence: Not on file    Family History  Problem Relation Age of Onset   Hypertension Mother      Current Outpatient Medications:    albuterol (VENTOLIN HFA) 108 (90 Base) MCG/ACT inhaler, Inhale 2 puffs into the lungs every 4 (four) hours as needed for wheezing or shortness of breath., Disp: , Rfl:    allopurinol (ZYLOPRIM) 300 MG tablet, Take 600 mg by mouth daily., Disp: , Rfl:    aspirin EC 81 MG tablet, Take 1 tablet by mouth daily., Disp: , Rfl:    buprenorphine-naloxone (SUBOXONE) 8-2 mg SUBL SL tablet, Place 1 tablet under the tongue in the morning, at noon, and at bedtime., Disp: , Rfl:    clopidogrel (PLAVIX) 75 MG tablet, Take 1 tablet (75 mg total) by mouth daily., Disp: 30 tablet, Rfl: 1   doxycycline (VIBRA-TABS) 100 MG tablet, Take 1 tablet (100 mg total) by mouth 2 (two)  times daily., Disp: 14 tablet, Rfl: 0   DULoxetine (CYMBALTA) 30 MG capsule, Take 1 capsule by mouth daily., Disp: , Rfl:    ezetimibe (ZETIA) 10 MG tablet, Take 10 mg by mouth daily., Disp: , Rfl:    FARXIGA 10 MG TABS tablet, Take 10 mg by mouth daily., Disp: , Rfl:    Fluticasone-Umeclidin-Vilant (TRELEGY ELLIPTA) 100-62.5-25 MCG/ACT AEPB, Inhale into the lungs., Disp: , Rfl:    gabapentin (NEURONTIN) 800 MG tablet, Take 800 mg by mouth 4 (four) times daily., Disp: , Rfl:    metFORMIN (GLUCOPHAGE) 1000 MG  tablet, Take 1,000 mg by mouth 2 (two) times daily with a meal., Disp: , Rfl:    metoprolol succinate (TOPROL-XL) 50 MG 24 hr tablet, Take 50 mg by mouth daily., Disp: , Rfl:    OXYGEN, Inhale 2 L into the lungs., Disp: , Rfl:    pantoprazole (PROTONIX) 40 MG tablet, Take 40 mg by mouth daily., Disp: , Rfl:    roflumilast (DALIRESP) 500 MCG TABS tablet, Take by mouth., Disp: , Rfl:    simvastatin (ZOCOR) 40 MG tablet, Take 1 tablet by mouth daily., Disp: , Rfl:    cholecalciferol (VITAMIN D) 1000 units tablet, Take 1,000 Units by mouth daily. (Patient not taking: Reported on 03/06/2022), Disp: , Rfl:    clonazePAM (KLONOPIN) 1 MG tablet, Take 1 mg by mouth 2 (two) times daily as needed for anxiety. (Patient not taking: Reported on 01/28/2022), Disp: , Rfl:   Physical exam:  Vitals:   06/10/22 1418  BP: 113/84  Pulse: (!) 103  Resp: 18  Temp: (!) 96.4 F (35.8 C)  TempSrc: Tympanic  SpO2: 93%  Weight: 154 lb (69.9 kg)  Height: 5\' 8"  (1.727 m)   Physical Exam Constitutional:      General: He is not in acute distress.    Comments: On home oxygen  Cardiovascular:     Rate and Rhythm: Normal rate and regular rhythm.     Heart sounds: Normal heart sounds.  Pulmonary:     Effort: Pulmonary effort is normal.     Breath sounds: Normal breath sounds.  Abdominal:     General: Bowel sounds are normal.     Palpations: Abdomen is soft.  Musculoskeletal:     Comments: There is erythema  and swelling noted over the left big toe with an oval ulceration over the medial aspect.  There is also another solitary ulcer noted over the left foot which appears to be chronic and noninfected  Skin:    General: Skin is warm and dry.  Neurological:     Mental Status: He is alert and oriented to person, place, and time.         Latest Ref Rng & Units 08/31/2021   11:36 AM  CMP  Glucose 70 - 99 mg/dL 126   BUN 8 - 23 mg/dL 17   Creatinine 0.61 - 1.24 mg/dL 1.20   Sodium 135 - 145 mmol/L 137   Potassium 3.5 - 5.1 mmol/L 4.4   Chloride 98 - 111 mmol/L 99   CO2 22 - 32 mmol/L 28   Calcium 8.9 - 10.3 mg/dL 8.9   Total Protein 6.5 - 8.1 g/dL 7.2   Total Bilirubin 0.3 - 1.2 mg/dL 0.7   Alkaline Phos 38 - 126 U/L 102   AST 15 - 41 U/L 21   ALT 0 - 44 U/L 10       Latest Ref Rng & Units 03/06/2022   11:38 AM  CBC  WBC 4.0 - 10.5 K/uL 11.5   Hemoglobin 13.0 - 17.0 g/dL 11.5   Hematocrit 39.0 - 52.0 % 38.4   Platelets 150 - 400 K/uL 389     No images are attached to the encounter.  CT Chest Wo Contrast  Result Date: 06/09/2022 CLINICAL DATA:  Restaging left upper lobe lung cancer. Initial diagnosis in August 2021. History of radiation therapy. EXAM: CT CHEST WITHOUT CONTRAST TECHNIQUE: Multidetector CT imaging of the chest was performed following the standard protocol without IV contrast. RADIATION DOSE REDUCTION: This exam was performed according  to the departmental dose-optimization program which includes automated exposure control, adjustment of the mA and/or kV according to patient size and/or use of iterative reconstruction technique. COMPARISON:  Multiple prior imaging studies. The most recent chest CTs 02/26/2022 and the most recent PET-CT is 09/12/2021 FINDINGS: Cardiovascular: The heart is normal in size. No pericardial effusion. The aorta demonstrates stable age advanced atherosclerotic calcifications but no aneurysm. Stable coronary artery calcifications. Mediastinum/Nodes:  Stable numerous small scattered mediastinal lymph nodes. No new progressive findings. These are likely reactive and related to the significant underlying disease. Precarinal lymph is stable at 9 mm. 9 mm subcarinal node is also stable. The esophagus is unremarkable. The thyroid gland is unremarkable. Lungs/Pleura: The treated peripheral left upper lobe lung mass measures 2 x 2 cm which when remeasured in the same imaging planes is stable. Surrounding radiation changes again demonstrated. On the prior CT scan there was an irregular nodule in the medial left lower lobe. This has resolved and was likely an area of inflammation or atelectasis. No worrisome pulmonary lesions or new pulmonary nodules. Few scattered sub 4 mm subpleural nodules are unchanged and likely small lymph nodes. Stable severe emphysematous changes and pulmonary scarring. Upper Abdomen: No significant upper abdominal findings. No hepatic or adrenal gland lesions. Stable advanced vascular calcifications. Musculoskeletal: No chest wall mass, supraclavicular or axillary adenopathy. The bony thorax is intact. Stable T5 compression deformity. Remote sternal and rib fractures again noted. IMPRESSION: 1. Stable treated peripheral left upper lobe lung mass with surrounding radiation changes. No findings suspicious for recurrent tumor. 2. Resolution of the medial left lower lobe pulmonary nodule. This was likely an area of inflammation or atelectasis. 3. No worrisome pulmonary lesions or new pulmonary nodules. 4. Recommend continued routine surveillance imaging. 5. Stable severe emphysematous changes and pulmonary scarring. 6. Stable age advanced atherosclerotic calcifications involving the aorta and coronary arteries. Aortic Atherosclerosis (ICD10-I70.0) and Emphysema (ICD10-J43.9). Electronically Signed   By: Marijo Sanes M.D.   On: 06/09/2022 10:52     Assessment and plan- Patient is a 66 y.o. male who is here for following issues  History of stage  I lung cancer involving the left upper lobe in 2021 s/p SBRT: Most recent CT scan from 06/07/2022 showed stable treated left upper lobe lung mass with surrounding radiation changes and no new areas of progressive disease.  I will repeat a CT chest abdomen and pelvis with contrast sometime in 4 months and see him thereafter.  If scans are stable I will move him to scans every 6 months. Left toe cellulitis: I will prescribe him doxycycline 100 mg twice daily for 7 days Normocytic anemia: Likely secondary to chronic disease as well.  We will check CBC ferritin iron studies B12 and folate today   Visit Diagnosis 1. Iron deficiency anemia, unspecified iron deficiency anemia type   2. Primary cancer of left upper lobe of lung (HCC)   3. Cellulitis of left toe      Dr. Randa Evens, MD, MPH [facility] at [facility] [phone] 06/10/2022 3:26 PM

## 2022-06-12 ENCOUNTER — Encounter: Payer: Self-pay | Admitting: Oncology

## 2022-06-19 ENCOUNTER — Telehealth: Payer: Self-pay | Admitting: *Deleted

## 2022-06-19 NOTE — Telephone Encounter (Signed)
Ok to come for iv iron

## 2022-06-19 NOTE — Telephone Encounter (Signed)
Daughter called stating that patient has been on Doxycycline antibiotics until 2 days ago and that he is to get iron infusion tomorrow, She is asking if he needs to reschedule his iron or if he can still get it tomorrow. Please advise

## 2022-06-19 NOTE — Telephone Encounter (Signed)
Call returned to [facility] ([place]) and informed per Dr Janese Banks ok to get iron infusion tomorrow

## 2022-06-20 ENCOUNTER — Inpatient Hospital Stay: Payer: 59

## 2022-06-20 MED FILL — Iron Sucrose Inj 20 MG/ML (Fe Equiv): INTRAVENOUS | Qty: 10 | Status: AC

## 2022-06-24 ENCOUNTER — Inpatient Hospital Stay: Payer: 59

## 2022-06-24 VITALS — BP 108/74 | HR 73 | Temp 97.4°F | Resp 18

## 2022-06-24 DIAGNOSIS — D508 Other iron deficiency anemias: Secondary | ICD-10-CM

## 2022-06-24 DIAGNOSIS — D509 Iron deficiency anemia, unspecified: Secondary | ICD-10-CM | POA: Diagnosis not present

## 2022-06-24 MED ORDER — SODIUM CHLORIDE 0.9 % IV SOLN
200.0000 mg | INTRAVENOUS | Status: DC
  Administered 2022-06-24: 200 mg via INTRAVENOUS
  Filled 2022-06-24: qty 10

## 2022-06-24 MED ORDER — SODIUM CHLORIDE 0.9 % IV SOLN
Freq: Once | INTRAVENOUS | Status: AC
  Filled 2022-06-24: qty 250

## 2022-07-01 MED FILL — Iron Sucrose Inj 20 MG/ML (Fe Equiv): INTRAVENOUS | Qty: 10 | Status: AC

## 2022-07-02 ENCOUNTER — Inpatient Hospital Stay: Payer: 59

## 2022-07-04 ENCOUNTER — Inpatient Hospital Stay: Payer: 59

## 2022-07-04 VITALS — BP 118/78 | HR 72 | Temp 98.3°F | Resp 18

## 2022-07-04 DIAGNOSIS — D509 Iron deficiency anemia, unspecified: Secondary | ICD-10-CM | POA: Diagnosis not present

## 2022-07-04 DIAGNOSIS — D508 Other iron deficiency anemias: Secondary | ICD-10-CM

## 2022-07-04 MED ORDER — SODIUM CHLORIDE 0.9 % IV SOLN
200.0000 mg | INTRAVENOUS | Status: DC
  Administered 2022-07-04: 200 mg via INTRAVENOUS
  Filled 2022-07-04: qty 200

## 2022-07-04 MED ORDER — CYANOCOBALAMIN 1000 MCG/ML IJ SOLN
1000.0000 ug | INTRAMUSCULAR | Status: DC
  Administered 2022-07-04: 1000 ug via INTRAMUSCULAR
  Filled 2022-07-04: qty 1

## 2022-07-04 MED ORDER — SODIUM CHLORIDE 0.9 % IV SOLN
Freq: Once | INTRAVENOUS | Status: AC
  Filled 2022-07-04: qty 250

## 2022-07-12 ENCOUNTER — Telehealth: Payer: Self-pay | Admitting: *Deleted

## 2022-07-12 ENCOUNTER — Inpatient Hospital Stay: Payer: 59 | Attending: Oncology

## 2022-07-12 ENCOUNTER — Other Ambulatory Visit: Payer: Self-pay | Admitting: *Deleted

## 2022-07-12 VITALS — BP 106/81 | HR 89 | Temp 97.1°F | Resp 22

## 2022-07-12 DIAGNOSIS — Z85118 Personal history of other malignant neoplasm of bronchus and lung: Secondary | ICD-10-CM | POA: Insufficient documentation

## 2022-07-12 DIAGNOSIS — D508 Other iron deficiency anemias: Secondary | ICD-10-CM

## 2022-07-12 DIAGNOSIS — C3412 Malignant neoplasm of upper lobe, left bronchus or lung: Secondary | ICD-10-CM | POA: Diagnosis present

## 2022-07-12 DIAGNOSIS — D509 Iron deficiency anemia, unspecified: Secondary | ICD-10-CM | POA: Diagnosis not present

## 2022-07-12 DIAGNOSIS — Z923 Personal history of irradiation: Secondary | ICD-10-CM | POA: Diagnosis not present

## 2022-07-12 MED ORDER — SODIUM CHLORIDE 0.9 % IV SOLN
200.0000 mg | INTRAVENOUS | Status: DC
  Administered 2022-07-12: 200 mg via INTRAVENOUS
  Filled 2022-07-12: qty 200

## 2022-07-12 MED ORDER — SODIUM CHLORIDE 0.9 % IV SOLN
Freq: Once | INTRAVENOUS | Status: AC
  Filled 2022-07-12: qty 250

## 2022-07-12 MED ORDER — CYANOCOBALAMIN 1000 MCG/ML IJ SOLN: 1000.0000 ug | INTRAMUSCULAR | 11 refills | Status: DC

## 2022-07-12 MED ORDER — "SYRINGE 25G X 1"" 3 ML MISC": 1.0000 | 0 refills | Status: DC

## 2022-07-12 NOTE — Telephone Encounter (Signed)
Christian Fields was taking care of Christian Fields in infusion and he asked if he can start getting b12 inj. At home in stead of getting at cancer center. Christian Fields said in the secure chat that Christian Fields states he has given  injections before and know how to put in his muscle in his arm. He wishes to get the b12 at pharmacy and do it his self. I asked Dr. Tish Men about this due to Janese Banks on vacation and he said yes. I sent Christian Fields a message letting her know that Christian Fields can do the b12 inj at home and she will tell him that.

## 2022-07-15 ENCOUNTER — Telehealth: Payer: Self-pay

## 2022-07-15 ENCOUNTER — Other Ambulatory Visit: Payer: Self-pay

## 2022-07-15 NOTE — Telephone Encounter (Signed)
PA for B12 inj submitted online via Cover My Meds (Key: [id number])

## 2022-07-15 NOTE — Telephone Encounter (Signed)
PA denied.  Reason: Cyanocobalam Inj 1041mcg is one of the drugs that are excluded from Medicare coverage by law, and we do not offer the drug as a supplemental benefit.  Dietary supplements, including prescription vitamins and mineral products (except prenatal vitamins and fluoride), health or beauty aids, herbal supplements and/or alternative medicines are excluded from coverage under Medicare rules. Please refer to your Evidence of Coverage (EOC) document that details your Medicare prescription drug coverage.

## 2022-07-16 ENCOUNTER — Encounter: Payer: Self-pay | Admitting: Oncology

## 2022-07-16 ENCOUNTER — Telehealth: Payer: Self-pay | Admitting: *Deleted

## 2022-07-16 NOTE — Telephone Encounter (Signed)
Called the pt. And let him know that we did send in b12 medication for monthly shots but I was told that [place] covers this at home. I asked him what he wanted to do about this. He said he called the pharmacy and it is cheap so he will just still get it and pay for it out of his pocket.

## 2022-08-01 ENCOUNTER — Ambulatory Visit: Payer: 59

## 2022-08-01 ENCOUNTER — Ambulatory Visit
Admission: RE | Admit: 2022-08-01 | Discharge: 2022-08-01 | Disposition: A | Discharge status: Home / Self Care | Payer: 59 | Source: Ambulatory Visit | Attending: Radiation Oncology | Admitting: Radiation Oncology

## 2022-08-01 ENCOUNTER — Other Ambulatory Visit: Payer: Self-pay | Admitting: *Deleted

## 2022-08-01 ENCOUNTER — Inpatient Hospital Stay: Payer: 59

## 2022-08-01 ENCOUNTER — Encounter: Payer: Self-pay | Admitting: Radiation Oncology

## 2022-08-01 VITALS — BP 149/80 | HR 77 | Temp 98.2°F | Resp 20 | Wt 154.7 lb

## 2022-08-01 DIAGNOSIS — C3412 Malignant neoplasm of upper lobe, left bronchus or lung: Secondary | ICD-10-CM | POA: Insufficient documentation

## 2022-08-01 DIAGNOSIS — D509 Iron deficiency anemia, unspecified: Secondary | ICD-10-CM | POA: Diagnosis not present

## 2022-08-01 DIAGNOSIS — Z923 Personal history of irradiation: Secondary | ICD-10-CM | POA: Insufficient documentation

## 2022-08-01 LAB — CBC WITH DIFFERENTIAL/PLATELET
Abs Immature Granulocytes: 0.02 10*3/uL (ref 0.00–0.07)
Basophils Absolute: 0.1 10*3/uL (ref 0.0–0.1)
Basophils Relative: 1 %
Eosinophils Absolute: 0.3 10*3/uL (ref 0.0–0.5)
Eosinophils Relative: 3 %
HCT: 38.6 % — ABNORMAL LOW (ref 39.0–52.0)
Hemoglobin: 11.7 g/dL — ABNORMAL LOW (ref 13.0–17.0)
Immature Granulocytes: 0 %
Lymphocytes Relative: 22 %
Lymphs Abs: 2.1 10*3/uL (ref 0.7–4.0)
MCH: 28 pg (ref 26.0–34.0)
MCHC: 30.3 g/dL (ref 30.0–36.0)
MCV: 92.3 fL (ref 80.0–100.0)
Monocytes Absolute: 0.6 10*3/uL (ref 0.1–1.0)
Monocytes Relative: 7 %
Neutro Abs: 6.3 10*3/uL (ref 1.7–7.7)
Neutrophils Relative %: 67 %
Platelets: 207 10*3/uL (ref 150–400)
RBC: 4.18 MIL/uL — ABNORMAL LOW (ref 4.22–5.81)
RDW: 16.3 % — ABNORMAL HIGH (ref 11.5–15.5)
WBC: 9.3 10*3/uL (ref 4.0–10.5)
nRBC: 0 % (ref 0.0–0.2)

## 2022-08-01 LAB — COMPREHENSIVE METABOLIC PANEL
ALT: 11 U/L (ref 0–44)
AST: 14 U/L — ABNORMAL LOW (ref 15–41)
Albumin: 3.5 g/dL (ref 3.5–5.0)
Alkaline Phosphatase: 108 U/L (ref 38–126)
Anion gap: 9 (ref 5–15)
BUN: 14 mg/dL (ref 8–23)
CO2: 24 mmol/L (ref 22–32)
Calcium: 8.9 mg/dL (ref 8.9–10.3)
Chloride: 104 mmol/L (ref 98–111)
Creatinine, Ser: 0.88 mg/dL (ref 0.61–1.24)
GFR, Estimated: >60 mL/min (ref >60)
Glucose, Bld: 115 mg/dL — ABNORMAL HIGH (ref 70–99)
Potassium: 4.2 mmol/L (ref 3.5–5.1)
Sodium: 137 mmol/L (ref 135–145)
Total Bilirubin: 0.3 mg/dL (ref 0.3–1.2)
Total Protein: 7.2 g/dL (ref 6.5–8.1)

## 2022-08-01 NOTE — Progress Notes (Signed)
Radiation Oncology Follow up Note  Name: Christian Fields   Date:   08/01/2022 MRN:  [id number] DOB: November 21, 1956    This 66 y.o. male presents to the clinic today for close to 2-year follow-up status post SBRT to his left upper lobe for stage I non-small cell lung cancer.  REFERRING PROVIDER: Macarthur Critchley, MD  HPI: Patient is a 66 year old male now out close to 2 years having completed SBRT to his left upper lobe for stage I non-small cell lung cancer.  Seen today in routine follow-up.  He is doing well specifically [place] cough hemoptysis or chest tightness.  Had a CT scan back in June which I have reviewed showing stable treated peripheral left upper lobe mass no findings suspicious for recurrent or progressive disease.  He had resolution of the medial left lower lobe pulmonary nodule likely inflammatory in nature.  COMPLICATIONS OF TREATMENT: none  FOLLOW UP COMPLIANCE: keeps appointments   PHYSICAL EXAM:  BP (!) 149/80 (BP Location: Right Arm, Patient Position: Sitting, Cuff Size: Normal)   Pulse 77   Temp 98.2 F (36.8 C) (Tympanic)   Resp 20   Wt 154 lb 11.2 oz (70.2 kg)   SpO2 (!) 2%   BMI 23.52 kg/m patient is on nasal oxygen Well-developed well-nourished patient in NAD. HEENT reveals PERLA, EOMI, discs not visualized.  Oral cavity is clear. No oral mucosal lesions are identified. Neck is clear without evidence of cervical or supraclavicular adenopathy. Lungs are clear to A&P. Cardiac examination is essentially unremarkable with regular rate and rhythm without murmur rub or thrill. Abdomen is benign with no organomegaly or masses noted. Motor sensory and DTR levels are equal and symmetric in the upper and lower extremities. Cranial nerves II through XII are grossly intact. Proprioception is intact. No peripheral adenopathy or edema is identified. No motor or sensory levels are noted. Crude visual fields are within normal range.  RADIOLOGY RESULTS: CT scan reviewed compatible with  above-stated findings  PLAN: At the present time patient is doing well stable chest over close to 2 years out from SBRT.  And pleased with his overall progress.  I have asked to see him back in 1 year for follow-up.  He already has a scheduled CT scan in November which he will copy me at the time of his appointment.  Patient is to call with any concerns.  I would like to take this opportunity to thank you for allowing me to participate in the care of your patient.Noreene Filbert, MD

## 2022-09-03 ENCOUNTER — Ambulatory Visit: Payer: 59

## 2022-10-02 ENCOUNTER — Ambulatory Visit: Payer: 59

## 2022-10-07 ENCOUNTER — Encounter (INDEPENDENT_AMBULATORY_CARE_PROVIDER_SITE_OTHER): Payer: Self-pay

## 2022-10-14 ENCOUNTER — Ambulatory Visit: Admission: RE | Admit: 2022-10-14 | Payer: 59 | Source: Ambulatory Visit

## 2022-10-16 ENCOUNTER — Other Ambulatory Visit: Payer: 59

## 2022-10-16 ENCOUNTER — Ambulatory Visit: Payer: 59 | Admitting: Oncology

## 2022-10-17 ENCOUNTER — Ambulatory Visit: Admission: RE | Admit: 2022-10-17 | Payer: 59 | Source: Ambulatory Visit

## 2022-10-17 ENCOUNTER — Ambulatory Visit: Payer: 59

## 2022-10-17 ENCOUNTER — Ambulatory Visit
Admission: RE | Admit: 2022-10-17 | Discharge: 2022-10-17 | Disposition: A | Discharge status: Home / Self Care | Payer: 59 | Source: Ambulatory Visit | Attending: Oncology | Admitting: Oncology

## 2022-10-17 DIAGNOSIS — C3412 Malignant neoplasm of upper lobe, left bronchus or lung: Secondary | ICD-10-CM | POA: Diagnosis present

## 2022-10-17 DIAGNOSIS — D509 Iron deficiency anemia, unspecified: Secondary | ICD-10-CM | POA: Insufficient documentation

## 2022-10-17 LAB — POCT I-STAT CREATININE: Creatinine, Ser: 1.8 mg/dL — ABNORMAL HIGH (ref 0.61–1.24)

## 2022-10-17 MED ORDER — IOHEXOL 300 MG/ML  SOLN
85.0000 mL | Freq: Once | INTRAMUSCULAR | Status: AC | PRN
  Administered 2022-10-17: 85 mL via INTRAVENOUS

## 2022-10-25 ENCOUNTER — Telehealth: Payer: Self-pay | Admitting: *Deleted

## 2022-10-25 ENCOUNTER — Inpatient Hospital Stay (HOSPITAL_BASED_OUTPATIENT_CLINIC_OR_DEPARTMENT_OTHER): Payer: 59 | Admitting: Oncology

## 2022-10-25 ENCOUNTER — Encounter: Payer: Self-pay | Admitting: Oncology

## 2022-10-25 ENCOUNTER — Inpatient Hospital Stay: Payer: 59 | Attending: Oncology

## 2022-10-25 VITALS — BP 115/79 | HR 83 | Temp 96.6°F | Resp 18 | Wt 150.3 lb

## 2022-10-25 DIAGNOSIS — E875 Hyperkalemia: Secondary | ICD-10-CM | POA: Insufficient documentation

## 2022-10-25 DIAGNOSIS — Z923 Personal history of irradiation: Secondary | ICD-10-CM | POA: Insufficient documentation

## 2022-10-25 DIAGNOSIS — D509 Iron deficiency anemia, unspecified: Secondary | ICD-10-CM | POA: Diagnosis not present

## 2022-10-25 DIAGNOSIS — Z79899 Other long term (current) drug therapy: Secondary | ICD-10-CM | POA: Insufficient documentation

## 2022-10-25 DIAGNOSIS — I7143 Infrarenal abdominal aortic aneurysm, without rupture: Secondary | ICD-10-CM | POA: Insufficient documentation

## 2022-10-25 DIAGNOSIS — Z7902 Long term (current) use of antithrombotics/antiplatelets: Secondary | ICD-10-CM | POA: Insufficient documentation

## 2022-10-25 DIAGNOSIS — Z7982 Long term (current) use of aspirin: Secondary | ICD-10-CM | POA: Insufficient documentation

## 2022-10-25 DIAGNOSIS — C3412 Malignant neoplasm of upper lobe, left bronchus or lung: Secondary | ICD-10-CM

## 2022-10-25 DIAGNOSIS — Z7984 Long term (current) use of oral hypoglycemic drugs: Secondary | ICD-10-CM | POA: Insufficient documentation

## 2022-10-25 DIAGNOSIS — Z87891 Personal history of nicotine dependence: Secondary | ICD-10-CM | POA: Diagnosis not present

## 2022-10-25 LAB — CBC WITH DIFFERENTIAL/PLATELET
Abs Immature Granulocytes: 0.12 10*3/uL — ABNORMAL HIGH (ref 0.00–0.07)
Basophils Absolute: 0.1 10*3/uL (ref 0.0–0.1)
Basophils Relative: 1 %
Eosinophils Absolute: 0.6 10*3/uL — ABNORMAL HIGH (ref 0.0–0.5)
Eosinophils Relative: 5 %
HCT: 41.5 % (ref 39.0–52.0)
Hemoglobin: 12.5 g/dL — ABNORMAL LOW (ref 13.0–17.0)
Immature Granulocytes: 1 %
Lymphocytes Relative: 21 %
Lymphs Abs: 2.7 10*3/uL (ref 0.7–4.0)
MCH: 27.9 pg (ref 26.0–34.0)
MCHC: 30.1 g/dL (ref 30.0–36.0)
MCV: 92.6 fL (ref 80.0–100.0)
Monocytes Absolute: 0.5 10*3/uL (ref 0.1–1.0)
Monocytes Relative: 4 %
Neutro Abs: 9.1 10*3/uL — ABNORMAL HIGH (ref 1.7–7.7)
Neutrophils Relative %: 68 %
Platelets: 345 10*3/uL (ref 150–400)
RBC: 4.48 MIL/uL (ref 4.22–5.81)
RDW: 15.1 % (ref 11.5–15.5)
WBC: 13.2 10*3/uL — ABNORMAL HIGH (ref 4.0–10.5)
nRBC: 0 % (ref 0.0–0.2)

## 2022-10-25 LAB — COMPREHENSIVE METABOLIC PANEL
ALT: 23 U/L (ref 0–44)
AST: 26 U/L (ref 15–41)
Albumin: 3.6 g/dL (ref 3.5–5.0)
Alkaline Phosphatase: 135 U/L — ABNORMAL HIGH (ref 38–126)
Anion gap: 8 (ref 5–15)
BUN: 23 mg/dL (ref 8–23)
CO2: 23 mmol/L (ref 22–32)
Calcium: 9.9 mg/dL (ref 8.9–10.3)
Chloride: 101 mmol/L (ref 98–111)
Creatinine, Ser: 1.72 mg/dL — ABNORMAL HIGH (ref 0.61–1.24)
GFR, Estimated: 43 mL/min — ABNORMAL LOW (ref >60)
Glucose, Bld: 118 mg/dL — ABNORMAL HIGH (ref 70–99)
Potassium: 5.5 mmol/L — ABNORMAL HIGH (ref 3.5–5.1)
Sodium: 132 mmol/L — ABNORMAL LOW (ref 135–145)
Total Bilirubin: 0.1 mg/dL — ABNORMAL LOW (ref 0.3–1.2)
Total Protein: 8.3 g/dL — ABNORMAL HIGH (ref 6.5–8.1)

## 2022-10-25 NOTE — Progress Notes (Signed)
Hematology/Oncology Consult note [facility]  [phone] [phone]  Patient Care Team: Ziglar, Lincoln Brigham, MD as PCP - General (Family Medicine) Telford Nab, RN as Oncology Nurse Navigator Noreene Filbert, MD as Referring Physician (Radiation Oncology) Sindy Guadeloupe, MD as Consulting Physician (Hematology and Oncology)   Name of the patient: Christian Fields  [id number]  1955/12/29   Date of visit: 10/25/22  Diagnosis- stage I lung cancer s/p radiation Normocytic anemia    Chief complaint/ Reason for visit-routine follow-up of lung cancer  Heme/Onc history: Patient is a 66 year old male who was admitted to the hospital in April 2021 for community-acquired pneumonia and as a part of the work-up he had a CT angio chest done which incidentally showed a 1 cm irregular left upper lobe lung nodule repeat CT scan in August 2021 showed an interval increase in the size from 1 to 1.4 cm.  No appreciable thoracic adenopathy.  Severe centrilobular emphysema.  Chronic pulmonary hypertension.  Patient has been seen by Dr. Raul Del and has been considered to be high risk for biopsy from their standpoint.  He underwent SBRT to the left upper lobe lung lesion.   Labs from September 2021 showed a normal liver and kidney function.  TSH was normal.  Vitamin D levels were low.  CBC showed white count of 12.6, H&H of 10.9/37 with an MCV of 102 and a platelet count of 358.   Anemia work-up from 10/27/2020 showed a mildly low ferritin level of 28 with an iron saturation of 8% myeloma panel, serum free light chains, haptoglobin, TSH and LDH was normal.  B12 levels were low at 166.  Interval history-patient has baseline fatigue and exertional shortness of breath which is remained unchanged.  No recent hospitalizations.  ECOG PS- 2 Pain scale- 0   Review of systems- Review of Systems  Constitutional:  Positive for malaise/fatigue. Negative for chills, fever and  weight loss.  HENT:  Negative for congestion, ear discharge and nosebleeds.   Eyes:  Negative for blurred vision.  Respiratory:  Positive for shortness of breath. Negative for cough, hemoptysis, sputum production and wheezing.   Cardiovascular:  Negative for chest pain, palpitations, orthopnea and claudication.  Gastrointestinal:  Negative for abdominal pain, blood in stool, constipation, diarrhea, heartburn, melena, nausea and vomiting.  Genitourinary:  Negative for dysuria, flank pain, frequency, hematuria and urgency.  Musculoskeletal:  Negative for back pain, joint pain and myalgias.  Skin:  Negative for rash.  Neurological:  Negative for dizziness, tingling, focal weakness, seizures, weakness and headaches.  Endo/Heme/Allergies:  Does not bruise/bleed easily.  Psychiatric/Behavioral:  Negative for depression and suicidal ideas. The patient does not have insomnia.       Allergies  Allergen Reactions   Bee Venom Anaphylaxis     Past Medical History:  Diagnosis Date   Anemia    Anxiety    Arthritis    Asthma    Cancer ([place])    Basal Cell Skin Cancer   Chronic back pain    COPD (chronic obstructive pulmonary disease) (HCC)    Depression    Diabetes mellitus (HCC)    Dyspnea    GERD (gastroesophageal reflux disease)    Gout    Gout    Headache    History of blood clots    Left Leg--July 2018   History of kidney stones    Hyperlipidemia    Hyperlipidemia    Hypertension    Kidney stones    Neuropathy  On home oxygen therapy    2 L / M   Pneumonia 06/2017   Sleep apnea    Ulcer of foot ([place])    Right     Past Surgical History:  Procedure Laterality Date   APPENDECTOMY     DG FEET 2 VIEWS BILAT     LIPOMA EXCISION Right 08/15/2017   Procedure: EXCISION TUMOR(CYST) FOOT;  Surgeon: Albertine Patricia, DPM;  Location: [facility] ORS;  Service: Podiatry;  Laterality: Right;   OTHER SURGICAL HISTORY Bilateral Foot surgery    Social History   Socioeconomic History    Marital status: Widowed    Spouse name: Not on file   Number of children: Not on file   Years of education: Not on file   Highest education level: Not on file  Occupational History   Not on file  Tobacco Use   Smoking status: Former    Packs/day: 0.50    Types: Cigarettes    Start date: 2018   Smokeless tobacco: Never  Vaping Use   Vaping Use: Never used  Substance and Sexual Activity   Alcohol use: Yes    Alcohol/week: 1.0 standard drink of alcohol    Types: 1 Cans of beer per week    Comment: occ   Drug use: No   Sexual activity: Not Currently  Other Topics Concern   Not on file  Social History Narrative   Not on file   Social Determinants of Health   Financial Resource Strain: Not on file  Food Insecurity: Not on file  Transportation Needs: Not on file  Physical Activity: Not on file  Stress: Not on file  Social Connections: Not on file  Intimate Partner Violence: Not on file    Family History  Problem Relation Age of Onset   Hypertension Mother      Current Outpatient Medications:    acetaminophen (TYLENOL) 325 MG tablet, Take 650 mg by mouth every 6 (six) hours as needed., Disp: , Rfl:    allopurinol (ZYLOPRIM) 300 MG tablet, Take 600 mg by mouth daily., Disp: , Rfl:    aspirin EC 81 MG tablet, Take 1 tablet by mouth daily., Disp: , Rfl:    buprenorphine-naloxone (SUBOXONE) 8-2 mg SUBL SL tablet, Place 1 tablet under the tongue in the morning, at noon, and at bedtime., Disp: , Rfl:    clopidogrel (PLAVIX) 75 MG tablet, Take 1 tablet (75 mg total) by mouth daily., Disp: 30 tablet, Rfl: 1   cyanocobalamin (VITAMIN B12) 1000 MCG/ML injection, Inject 1 mL (1,000 mcg total) into the muscle every 30 (thirty) days., Disp: 1 mL, Rfl: 11   DULoxetine (CYMBALTA) 30 MG capsule, Take 1 capsule by mouth daily., Disp: , Rfl:    ezetimibe (ZETIA) 10 MG tablet, Take 10 mg by mouth daily., Disp: , Rfl:    FARXIGA 10 MG TABS tablet, Take 10 mg by mouth daily., Disp: , Rfl:     Fluticasone-Umeclidin-Vilant (TRELEGY ELLIPTA) 100-62.5-25 MCG/ACT AEPB, Inhale into the lungs., Disp: , Rfl:    gabapentin (NEURONTIN) 800 MG tablet, Take 800 mg by mouth 4 (four) times daily., Disp: , Rfl:    metFORMIN (GLUCOPHAGE) 1000 MG tablet, Take 1,000 mg by mouth 2 (two) times daily with a meal., Disp: , Rfl:    metoprolol succinate (TOPROL-XL) 50 MG 24 hr tablet, Take 50 mg by mouth daily., Disp: , Rfl:    OXYGEN, Inhale 2 L into the lungs., Disp: , Rfl:    roflumilast (DALIRESP) 500 MCG  TABS tablet, Take by mouth., Disp: , Rfl:    simvastatin (ZOCOR) 40 MG tablet, Take 1 tablet by mouth daily., Disp: , Rfl:    sulfamethoxazole-trimethoprim (BACTRIM DS) 800-160 MG tablet, Take 1 tablet by mouth 2 (two) times daily., Disp: , Rfl:    Syringe/Needle, Disp, (SYRINGE 3CC/25GX1") 25G X 1" 3 ML MISC, 1 Syringe by Does not apply route every 30 (thirty) days., Disp: 12 each, Rfl: 0   albuterol (VENTOLIN HFA) 108 (90 Base) MCG/ACT inhaler, Inhale 2 puffs into the lungs every 4 (four) hours as needed for wheezing or shortness of breath., Disp: , Rfl:    cholecalciferol (VITAMIN D) 1000 units tablet, Take 1,000 Units by mouth daily. (Patient not taking: Reported on 10/25/2022), Disp: , Rfl:    clonazePAM (KLONOPIN) 1 MG tablet, Take 1 mg by mouth 2 (two) times daily as needed for anxiety. (Patient not taking: Reported on 10/25/2022), Disp: , Rfl:    doxycycline (VIBRA-TABS) 100 MG tablet, Take 1 tablet (100 mg total) by mouth 2 (two) times daily. (Patient not taking: Reported on 10/25/2022), Disp: 14 tablet, Rfl: 0   pantoprazole (PROTONIX) 40 MG tablet, Take 40 mg by mouth daily. (Patient not taking: Reported on 10/25/2022), Disp: , Rfl:   Physical exam:  Vitals:   10/25/22 0942  BP: 115/79  Pulse: 83  Resp: 18  Temp: (!) 96.6 F (35.9 C)  SpO2: 93%  Weight: 150 lb 4.8 oz (68.2 kg)   Physical Exam Constitutional:      General: He is not in acute distress. Cardiovascular:     Rate and  Rhythm: Normal rate and regular rhythm.     Heart sounds: Normal heart sounds.  Pulmonary:     Effort: Pulmonary effort is normal.     Comments: Breath sounds decreased bilaterally diffusely.  He is on home oxygen Abdominal:     General: Bowel sounds are normal.     Palpations: Abdomen is soft.  Skin:    General: Skin is warm and dry.  Neurological:     Mental Status: He is alert and oriented to person, place, and time.         Latest Ref Rng & Units 10/25/2022    9:23 AM  CMP  Glucose 70 - 99 mg/dL 118   BUN 8 - 23 mg/dL 23   Creatinine 0.61 - 1.24 mg/dL 1.72   Sodium 135 - 145 mmol/L 132   Potassium 3.5 - 5.1 mmol/L 5.5   Chloride 98 - 111 mmol/L 101   CO2 22 - 32 mmol/L 23   Calcium 8.9 - 10.3 mg/dL 9.9   Total Protein 6.5 - 8.1 g/dL 8.3   Total Bilirubin 0.3 - 1.2 mg/dL 0.1   Alkaline Phos 38 - 126 U/L 135   AST 15 - 41 U/L 26   ALT 0 - 44 U/L 23       Latest Ref Rng & Units 10/25/2022    9:23 AM  CBC  WBC 4.0 - 10.5 K/uL 13.2   Hemoglobin 13.0 - 17.0 g/dL 12.5   Hematocrit 39.0 - 52.0 % 41.5   Platelets 150 - 400 K/uL 345     No images are attached to the encounter.  CT CHEST ABDOMEN PELVIS W CONTRAST  Result Date: 10/20/2022 CLINICAL DATA:  Stage I left upper lobe lung cancer diagnosed August 2021 status post SBRT. Restaging. * Tracking Code: BO * EXAM: CT CHEST, ABDOMEN, AND PELVIS WITH CONTRAST TECHNIQUE: Multidetector CT imaging of the chest,  abdomen and pelvis was performed following the standard protocol during bolus administration of intravenous contrast. RADIATION DOSE REDUCTION: This exam was performed according to the departmental dose-optimization program which includes automated exposure control, adjustment of the mA and/or kV according to patient size and/or use of iterative reconstruction technique. CONTRAST:  39mL OMNIPAQUE IOHEXOL 300 MG/ML  SOLN COMPARISON:  06/07/2022 chest CT. 09/12/2021 PET-CT. 02/27/2021 CT abdomen/pelvis. FINDINGS: CT CHEST  FINDINGS Cardiovascular: Normal heart size. No significant pericardial effusion/thickening. Left anterior descending coronary atherosclerosis. Atherosclerotic nonaneurysmal thoracic aorta. Dilated main pulmonary artery (3.9 cm diameter). No central pulmonary emboli. Mediastinum/Nodes: No significant thyroid nodules. Unremarkable esophagus. No axillary adenopathy. Mildly enlarged 1.1 cm right paratracheal node (series 2/image 25) and mild right hilar adenopathy up to 1.1 cm (series 2/image 29), all stable back to at least 08/02/2021 chest CT angiogram. No new pathologically enlarged mediastinal nodes. No pathologically enlarged left hilar lymph nodes. Lungs/Pleura: No pneumothorax. No pleural effusion. Severe centrilobular emphysema. New indistinct nodular foci of consolidation in the bilateral upper lobes, measuring 1.3 x 0.8 cm in the posterior right upper lobe (series 3/image 45) and 0.7 cm in the left upper lobe (series 3/image 28). Solid tiny 0.3 cm peripheral right middle lobe pulmonary nodule (series 3/image 88) is stable. Nodular fibrosis in the peripheral posterior left upper lobe measures 2.2 x 2.0 cm (series 3/image 48), previously 2.2 x 2.1 cm on 06/07/2022 chest CT using similar measurement technique, not appreciably changed. Musculoskeletal: No aggressive appearing focal osseous lesions. Chronic mild T5 vertebral compression fracture. Healed deformities in multiple lateral left ribs. CT ABDOMEN PELVIS FINDINGS Hepatobiliary: Normal liver with no liver mass. Normal gallbladder with no radiopaque cholelithiasis. No biliary ductal dilatation. Pancreas: Normal, with no mass or duct dilation. Spleen: Normal size. No mass. Adrenals/Urinary Tract: Normal adrenals. No hydronephrosis. Exophytic hypodense 2.2 cm posterior interpolar right renal cortical lesion (series 2/image 66), stable size since 09/12/2021 PET-CT. Simple 2.8 cm anterior interpolar left renal cyst, for which no follow-up imaging is recommended.  Normal bladder. Stomach/Bowel: Normal non-distended stomach. Normal caliber small bowel with no small bowel wall thickening. Appendix not discretely visualized. Normal large bowel with no diverticulosis, large bowel wall thickening or pericolonic fat stranding. Vascular/Lymphatic: Atherosclerotic abdominal aorta with 4.1 cm infrarenal abdominal aortic aneurysm. Patent portal, splenic, hepatic and renal veins. No pathologically enlarged lymph nodes in the abdomen. Borderline mildly prominent left inguinal lymph nodes up to 1.1 cm (series 2/image 120), stable since 09/12/2021 PET-CT. No new pathologically enlarged pelvic lymph nodes. Reproductive: Normal size prostate with nonspecific coarse heterogeneous internal prostatic calcifications. Other: No pneumoperitoneum, ascites or focal fluid collection. Musculoskeletal: No aggressive appearing focal osseous lesions. Marked lumbar degenerative disc disease. IMPRESSION: 1. New indistinct nodular foci of consolidation in the bilateral upper lobes, largest 1.3 x 0.8 cm in the posterior right upper lobe. These findings are nonspecific and may be infectious/inflammatory, with metastatic disease less favored but not excluded. Recommend attention on follow-up chest CT in 3 months. 2. No additional potential findings of recurrent metastatic disease. Stable mild mediastinal, right hilar and left inguinal lymphadenopathy, nonspecific, more likely reactive. 3. Stable nodular fibrosis at the treatment site in the peripheral left upper lobe, with no local tumor recurrence. 4. Infrarenal 4.1 cm infrarenal abdominal aortic aneurysm. Recommend follow-up every 12 months and vascular consultation. This recommendation follows ACR consensus guidelines: White Paper of the ACR Incidental Findings Committee II on Vascular Findings. J Am Coll Radiol 2013; 10:789-794. 5. Dilated main pulmonary artery, suggesting pulmonary arterial hypertension. 6. One  vessel coronary atherosclerosis. 7.  Exophytic hypodense 2.2 cm posterior interpolar right renal cortical lesion, stable size since 09/12/2021 PET-CT, indeterminate. If clinically warranted given patient comorbidities, MRI abdomen without and with IV contrast could be obtained for further characterization. 8. Aortic Atherosclerosis (ICD10-I70.0) and Emphysema (ICD10-J43.9). Electronically Signed   By: Ilona Sorrel M.D.   On: 10/20/2022 14:31     Assessment and plan- Patient is a 66 y.o. male with history of stage I lung cancer involving the left upper lobe in 2021 s/p SBRT here for routine follow-up and discuss CT scan results   I have reviewed CT chest abdomen pelvis images independently and discussed findings with the patient which does not show any overt evidence of recurrent or progressive disease.  There is an area of consolidation involving the posterior right upper lobe measuring about 1.3 cm.  I will follow-up on these findings with a repeat CT chest 3 months from now.  Stable infrarenal 4.1 cm infrarenal AAA which I will follow-up in 1 year as well.  Exophytic lesion in the right kidney overall stable for 1 year.  Hyperkalemia: Mild with a potassium of 5.5.  We will repeat it in 1 week    Visit Diagnosis 1. Primary cancer of left upper lobe of lung ([place])      Dr. Randa Evens, MD, MPH [facility] [phone] 10/25/2022 1:28 PM

## 2022-10-25 NOTE — Telephone Encounter (Signed)
Dr. Janese Banks sent a message to me to call pt. His potassium is high and if he is on any potassium to please stop it. Also he will need a lab next week to check the level again. It is very important that upi get the lab. It is dangerous if the potassium gets too high. Left the telephone number here to call

## 2022-10-29 ENCOUNTER — Telehealth: Payer: Self-pay | Admitting: *Deleted

## 2022-10-29 ENCOUNTER — Other Ambulatory Visit: Payer: Self-pay | Admitting: *Deleted

## 2022-10-29 DIAGNOSIS — E875 Hyperkalemia: Secondary | ICD-10-CM

## 2022-10-29 NOTE — Telephone Encounter (Signed)
I had called the pt last week and no answer and I left message to not take any potassium if he is on that and we would like pt to come back this week of 11/20 to 11/23. He never called back but when I called him today he said that he got my message and it is probably high due to the potato soup that he had around the clock and that was only thing he could get down because he had several teeth taken out. He will come in tom at  11 am.

## 2022-10-29 NOTE — Telephone Encounter (Signed)
-----   Message from Sindy Guadeloupe, MD sent at 10/25/2022 10:16 AM EST ----- Make sure pt not taking any additional potassium. He needs to get repeat bmp next week

## 2022-10-30 ENCOUNTER — Other Ambulatory Visit: Payer: Self-pay

## 2022-10-30 ENCOUNTER — Inpatient Hospital Stay: Payer: 59

## 2022-10-30 DIAGNOSIS — E875 Hyperkalemia: Secondary | ICD-10-CM

## 2022-10-30 DIAGNOSIS — C3412 Malignant neoplasm of upper lobe, left bronchus or lung: Secondary | ICD-10-CM

## 2022-11-04 ENCOUNTER — Ambulatory Visit: Payer: 59

## 2022-11-18 ENCOUNTER — Ambulatory Visit: Admission: RE | Admit: 2022-11-18 | Payer: 59 | Source: Ambulatory Visit

## 2022-11-18 ENCOUNTER — Encounter: Admission: RE | Payer: Self-pay | Source: Ambulatory Visit

## 2022-11-18 SURGERY — COLONOSCOPY WITH PROPOFOL

## 2022-11-20 ENCOUNTER — Ambulatory Visit: Payer: 59

## 2022-11-26 ENCOUNTER — Inpatient Hospital Stay: Payer: 59 | Attending: Oncology

## 2022-11-26 DIAGNOSIS — C3412 Malignant neoplasm of upper lobe, left bronchus or lung: Secondary | ICD-10-CM | POA: Insufficient documentation

## 2022-11-26 DIAGNOSIS — D509 Iron deficiency anemia, unspecified: Secondary | ICD-10-CM | POA: Insufficient documentation

## 2022-11-26 LAB — BASIC METABOLIC PANEL
Anion gap: 11 (ref 5–15)
BUN: 11 mg/dL (ref 8–23)
CO2: 26 mmol/L (ref 22–32)
Calcium: 8.9 mg/dL (ref 8.9–10.3)
Chloride: 101 mmol/L (ref 98–111)
Creatinine, Ser: 1.01 mg/dL (ref 0.61–1.24)
GFR, Estimated: >60 mL/min (ref >60)
Glucose, Bld: 98 mg/dL (ref 70–99)
Potassium: 3.5 mmol/L (ref 3.5–5.1)
Sodium: 138 mmol/L (ref 135–145)

## 2023-01-24 ENCOUNTER — Ambulatory Visit
Admission: RE | Admit: 2023-01-24 | Discharge: 2023-01-24 | Disposition: A | Discharge status: Home / Self Care | Payer: 59 | Source: Ambulatory Visit | Attending: Oncology | Admitting: Oncology

## 2023-01-24 DIAGNOSIS — C3412 Malignant neoplasm of upper lobe, left bronchus or lung: Secondary | ICD-10-CM | POA: Insufficient documentation

## 2023-01-29 ENCOUNTER — Telehealth: Payer: Self-pay | Admitting: Oncology

## 2023-01-29 ENCOUNTER — Inpatient Hospital Stay: Payer: 59 | Attending: Oncology | Admitting: Oncology

## 2023-01-29 DIAGNOSIS — Z85118 Personal history of other malignant neoplasm of bronchus and lung: Secondary | ICD-10-CM

## 2023-01-29 DIAGNOSIS — Z08 Encounter for follow-up examination after completed treatment for malignant neoplasm: Secondary | ICD-10-CM

## 2023-01-29 NOTE — Telephone Encounter (Signed)
pt called in to req appt for today be done virtually. please advise

## 2023-01-29 NOTE — Progress Notes (Signed)
I connected with Christian Fields on 01/29/23 at  3:00 PM EST by telephone visit and verified that I am speaking with the correct person using two identifiers.   I discussed the limitations, risks, security and privacy concerns of performing an evaluation and management service by telemedicine and the availability of in-person appointments. I also discussed with the patient that there may be a patient responsible charge related to this service. The patient expressed understanding and agreed to proceed.  Other persons participating in the visit and their role in the encounter:  none  Patient's location:  home Provider's location:  work  Risk analyst Complaint:  discuss ct results and further management  History of present illness: Patient is a 67 year old male who was admitted to the hospital in April 2021 for community-acquired pneumonia and as a part of the work-up he had a CT angio chest done which incidentally showed a 1 cm irregular left upper lobe lung nodule repeat CT scan in August 2021 showed an interval increase in the size from 1 to 1.4 cm.  No appreciable thoracic adenopathy.  Severe centrilobular emphysema.  Chronic pulmonary hypertension.  Patient has been seen by Dr. Raul Del and has been considered to be high risk for biopsy from their standpoint.  He underwent SBRT to the left upper lobe lung lesion.   Labs from September 2021 showed a normal liver and kidney function.  TSH was normal.  Vitamin D levels were low.  CBC showed white count of 12.6, H&H of 10.9/37 with an MCV of 102 and a platelet count of 358.   Anemia work-up from 10/27/2020 showed a mildly low ferritin level of 28 with an iron saturation of 8% myeloma panel, serum free light chains, haptoglobin, TSH and LDH was normal.  B12 levels were low at 166.  Interval history he is doing well. No recent hospitalizations. Has baseline fatigue and SOB   Review of Systems  Constitutional:  Positive for malaise/fatigue. Negative for chills,  fever and weight loss.  HENT:  Negative for congestion, ear discharge and nosebleeds.   Eyes:  Negative for blurred vision.  Respiratory:  Positive for shortness of breath. Negative for cough, hemoptysis, sputum production and wheezing.   Cardiovascular:  Negative for chest pain, palpitations, orthopnea and claudication.  Gastrointestinal:  Negative for abdominal pain, blood in stool, constipation, diarrhea, heartburn, melena, nausea and vomiting.  Genitourinary:  Negative for dysuria, flank pain, frequency, hematuria and urgency.  Musculoskeletal:  Negative for back pain, joint pain and myalgias.  Skin:  Negative for rash.  Neurological:  Negative for dizziness, tingling, focal weakness, seizures, weakness and headaches.  Endo/Heme/Allergies:  Does not bruise/bleed easily.  Psychiatric/Behavioral:  Negative for depression and suicidal ideas. The patient does not have insomnia.     Allergies  Allergen Reactions   Bee Venom Anaphylaxis    Past Medical History:  Diagnosis Date   Anemia    Anxiety    Arthritis    Asthma    Cancer ([place])    Basal Cell Skin Cancer   Chronic back pain    COPD (chronic obstructive pulmonary disease) (HCC)    Depression    Diabetes mellitus (HCC)    Dyspnea    GERD (gastroesophageal reflux disease)    Gout    Gout    Headache    History of blood clots    Left Leg--July 2018   History of kidney stones    Hyperlipidemia    Hyperlipidemia    Hypertension    Kidney  stones    Neuropathy    On home oxygen therapy    2 L / M   Pneumonia 06/2017   Sleep apnea    Ulcer of foot ([place])    Right    Past Surgical History:  Procedure Laterality Date   APPENDECTOMY     DG FEET 2 VIEWS BILAT     LIPOMA EXCISION Right 08/15/2017   Procedure: EXCISION TUMOR(CYST) FOOT;  Surgeon: Albertine Patricia, DPM;  Location: [facility] ORS;  Service: Podiatry;  Laterality: Right;   OTHER SURGICAL HISTORY Bilateral Foot surgery    Social History   Socioeconomic History    Marital status: Widowed    Spouse name: Not on file   Number of children: Not on file   Years of education: Not on file   Highest education level: Not on file  Occupational History   Not on file  Tobacco Use   Smoking status: Former    Packs/day: 0.50    Types: Cigarettes    Start date: 2018   Smokeless tobacco: Never  Vaping Use   Vaping Use: Never used  Substance and Sexual Activity   Alcohol use: Yes    Alcohol/week: 1.0 standard drink of alcohol    Types: 1 Cans of beer per week    Comment: occ   Drug use: No   Sexual activity: Not Currently  Other Topics Concern   Not on file  Social History Narrative   Not on file   Social Determinants of Health   Financial Resource Strain: Not on file  Food Insecurity: Not on file  Transportation Needs: Not on file  Physical Activity: Not on file  Stress: Not on file  Social Connections: Not on file  Intimate Partner Violence: Not on file    Family History  Problem Relation Age of Onset   Hypertension Mother      Current Outpatient Medications:    acetaminophen (TYLENOL) 325 MG tablet, Take 650 mg by mouth every 6 (six) hours as needed., Disp: , Rfl:    albuterol (VENTOLIN HFA) 108 (90 Base) MCG/ACT inhaler, Inhale 2 puffs into the lungs every 4 (four) hours as needed for wheezing or shortness of breath., Disp: , Rfl:    allopurinol (ZYLOPRIM) 300 MG tablet, Take 600 mg by mouth daily., Disp: , Rfl:    aspirin EC 81 MG tablet, Take 1 tablet by mouth daily., Disp: , Rfl:    buprenorphine-naloxone (SUBOXONE) 8-2 mg SUBL SL tablet, Place 1 tablet under the tongue in the morning, at noon, and at bedtime., Disp: , Rfl:    cholecalciferol (VITAMIN D) 1000 units tablet, Take 1,000 Units by mouth daily. (Patient not taking: Reported on 10/25/2022), Disp: , Rfl:    clonazePAM (KLONOPIN) 1 MG tablet, Take 1 mg by mouth 2 (two) times daily as needed for anxiety. (Patient not taking: Reported on 10/25/2022), Disp: , Rfl:     clopidogrel (PLAVIX) 75 MG tablet, Take 1 tablet (75 mg total) by mouth daily., Disp: 30 tablet, Rfl: 1   cyanocobalamin (VITAMIN B12) 1000 MCG/ML injection, Inject 1 mL (1,000 mcg total) into the muscle every 30 (thirty) days., Disp: 1 mL, Rfl: 11   doxycycline (VIBRA-TABS) 100 MG tablet, Take 1 tablet (100 mg total) by mouth 2 (two) times daily. (Patient not taking: Reported on 10/25/2022), Disp: 14 tablet, Rfl: 0   DULoxetine (CYMBALTA) 30 MG capsule, Take 1 capsule by mouth daily., Disp: , Rfl:    ezetimibe (ZETIA) 10 MG tablet, Take 10  mg by mouth daily., Disp: , Rfl:    FARXIGA 10 MG TABS tablet, Take 10 mg by mouth daily., Disp: , Rfl:    Fluticasone-Umeclidin-Vilant (TRELEGY ELLIPTA) 100-62.5-25 MCG/ACT AEPB, Inhale into the lungs., Disp: , Rfl:    gabapentin (NEURONTIN) 800 MG tablet, Take 800 mg by mouth 4 (four) times daily., Disp: , Rfl:    metFORMIN (GLUCOPHAGE) 1000 MG tablet, Take 1,000 mg by mouth 2 (two) times daily with a meal., Disp: , Rfl:    metoprolol succinate (TOPROL-XL) 50 MG 24 hr tablet, Take 50 mg by mouth daily., Disp: , Rfl:    OXYGEN, Inhale 2 L into the lungs., Disp: , Rfl:    pantoprazole (PROTONIX) 40 MG tablet, Take 40 mg by mouth daily. (Patient not taking: Reported on 10/25/2022), Disp: , Rfl:    roflumilast (DALIRESP) 500 MCG TABS tablet, Take by mouth., Disp: , Rfl:    simvastatin (ZOCOR) 40 MG tablet, Take 1 tablet by mouth daily., Disp: , Rfl:    sulfamethoxazole-trimethoprim (BACTRIM DS) 800-160 MG tablet, Take 1 tablet by mouth 2 (two) times daily., Disp: , Rfl:    Syringe/Needle, Disp, (SYRINGE 3CC/25GX1") 25G X 1" 3 ML MISC, 1 Syringe by Does not apply route every 30 (thirty) days., Disp: 12 each, Rfl: 0  CT Chest Wo Contrast  Result Date: 01/27/2023 CLINICAL DATA:  67 year old male with history of left upper lobe lung cancer status post radiation therapy in 2021. Shortness of breath. Follow-up study. * Tracking Code: BO * EXAM: CT CHEST WITHOUT  CONTRAST TECHNIQUE: Multidetector CT imaging of the chest was performed following the standard protocol without IV contrast. RADIATION DOSE REDUCTION: This exam was performed according to the departmental dose-optimization program which includes automated exposure control, adjustment of the mA and/or kV according to patient size and/or use of iterative reconstruction technique. COMPARISON:  Multiple priors, most recently CT of the chest, abdomen and pelvis 10/17/2022. FINDINGS: Cardiovascular: Heart size is normal. There is no significant pericardial fluid, thickening or pericardial calcification. There is aortic atherosclerosis, as well as atherosclerosis of the great vessels of the mediastinum and the coronary arteries, including calcified atherosclerotic plaque in the left main, left anterior descending, left circumflex and right coronary arteries. Calcifications of the aortic valve. Dilatation of the pulmonic trunk (4.0 cm in diameter). Mediastinum/Nodes: No pathologically enlarged mediastinal or hilar lymph nodes. Please note that accurate exclusion of hilar adenopathy is limited on noncontrast CT scans. Esophagus is unremarkable in appearance. No axillary lymphadenopathy. 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 Previously described ill-defined areas of nodularity in the left upper lobe and right upper lobe seen on the most recent prior examination have resolved, indicative of benign infectious or inflammatory nodules on the prior study. No definite new suspicious appearing pulmonary nodules or masses are identified on today's examination. Mild chronic post infectious or inflammatory scarring is evident in the inferior segment of the lingula (unchanged).  Diffuse bronchial wall thickening with severe centrilobular and paraseptal emphysema. Upper Abdomen: In the anterolateral aspect of the lower pole of the left kidney there is an exophytic low-attenuation lesion measuring 2.6 cm in diameter, incompletely characterized, but previously characterized as a simple cyst (no imaging follow-up recommended). In the posterolateral aspect of the interpolar region of the right  kidney there is a 2.1 cm intermediate attenuation (34 HU) lesion which is stable compared to prior studies, likely a proteinaceous cyst (no imaging follow-up recommended). Atherosclerotic calcifications in the abdominal aorta. Musculoskeletal: Mild compression of the T5 vertebral body with less than 20% loss of height, unchanged compared to the prior examination. There are no aggressive appearing lytic or blastic lesions noted in the visualized portions of the skeleton. IMPRESSION: 1. Resolution of previously noted poorly defined pulmonary nodules in the upper lobes of the lungs bilaterally, indicative of benign infectious or inflammatory nodules on the prior study. 2. Treated left upper lobe neoplasm is stable in appearance. No new pulmonary nodules or findings of metastatic disease are noted in the thorax on today's examination. 3. Aortic atherosclerosis, in addition to left main and three-vessel coronary artery disease. Please note that although the presence of coronary artery calcium documents the presence of coronary artery disease, the severity of this disease and any potential stenosis cannot be assessed on this non-gated CT examination. Assessment for potential risk factor modification, dietary therapy or pharmacologic therapy may be warranted, if clinically indicated. 4. There are calcifications of the aortic valve. Echocardiographic correlation for evaluation of potential valvular dysfunction may be warranted if clinically indicated. 5. Diffuse bronchial wall thickening with severe centrilobular  and paraseptal emphysema; imaging findings suggestive of underlying COPD. 6. Dilatation of the pulmonic trunk (4.0 cm in diameter), concerning for pulmonary arterial hypertension. Aortic Atherosclerosis (ICD10-I70.0) and Emphysema (ICD10-J43.9). Electronically Signed   By: Vinnie Langton M.D.   On: 01/27/2023 08:17    No images are attached to the encounter.      Latest Ref Rng & Units 11/26/2022   12:58 PM  CMP  Glucose 70 - 99 mg/dL 98   BUN 8 - 23 mg/dL 11   Creatinine 0.61 - 1.24 mg/dL 1.01   Sodium 135 - 145 mmol/L 138   Potassium 3.5 - 5.1 mmol/L 3.5   Chloride 98 - 111 mmol/L 101   CO2 22 - 32 mmol/L 26   Calcium 8.9 - 10.3 mg/dL 8.9       Latest Ref Rng & Units 10/25/2022    9:23 AM  CBC  WBC 4.0 - 10.5 K/uL 13.2   Hemoglobin 13.0 - 17.0 g/dL 12.5   Hematocrit 39.0 - 52.0 % 41.5   Platelets 150 - 400 K/uL 345      Assessment and plan: Patient is a 67 year old male with history of stage I lung cancer involving the left upper lobe in 2021 s/p SBRT.  This is a visit to discuss CT scan results and further management  I have reviewed CT chest images independently and discussed findings with the patient which does not show any evidence of lung cancer recurrence.  This will be year 3 of surveillance and starting next year he will require yearly imaging.  Patient found to have evidence of aortic atherosclerosis and possible triple-vessel disease on CT chest.  I have discussed these findings with the patient and encouraged him to speak about it with his primary care doctor as well as cardiology   Follow-up instructions: CT chest without contrast in 6 months and see me thereafter  I discussed the assessment and treatment plan with the patient. The patient was provided an opportunity to ask questions and all were answered. The patient agreed with the plan and demonstrated an understanding of the instructions.   The patient was advised to call back or seek an in-person  evaluation if the symptoms worsen  or if the condition fails to improve as anticipated.  I provided 11 minutes of non face-to-face telephone visit time during this encounter, and > 50% was spent counseling as documented under my assessment & plan.  Visit Diagnosis: 1. Encounter for follow-up surveillance of lung cancer     Dr. Randa Evens, MD, MPH [facility] Tel- [phone] 01/29/2023 4:28 PM

## 2023-01-29 NOTE — Telephone Encounter (Signed)
Ok to do so

## 2023-01-30 ENCOUNTER — Other Ambulatory Visit: Payer: Self-pay | Admitting: *Deleted

## 2023-01-30 DIAGNOSIS — C3412 Malignant neoplasm of upper lobe, left bronchus or lung: Secondary | ICD-10-CM

## 2023-03-07 ENCOUNTER — Encounter: Payer: Self-pay | Admitting: *Deleted

## 2023-04-18 DIAGNOSIS — D72825 Bandemia: Secondary | ICD-10-CM | POA: Insufficient documentation

## 2023-06-11 ENCOUNTER — Other Ambulatory Visit: Payer: Self-pay | Admitting: Podiatry

## 2023-06-11 DIAGNOSIS — L97423 Non-pressure chronic ulcer of left heel and midfoot with necrosis of muscle: Secondary | ICD-10-CM

## 2023-06-19 ENCOUNTER — Encounter: Payer: Self-pay | Admitting: Oncology

## 2023-06-23 ENCOUNTER — Ambulatory Visit
Admission: RE | Admit: 2023-06-23 | Discharge: 2023-06-23 | Disposition: A | Discharge status: Home / Self Care | Payer: 59 | Source: Ambulatory Visit | Attending: Podiatry | Admitting: Podiatry

## 2023-06-23 DIAGNOSIS — L97423 Non-pressure chronic ulcer of left heel and midfoot with necrosis of muscle: Secondary | ICD-10-CM

## 2023-07-02 ENCOUNTER — Inpatient Hospital Stay: Payer: 59

## 2023-07-02 ENCOUNTER — Emergency Department: Payer: 59

## 2023-07-02 ENCOUNTER — Inpatient Hospital Stay
Admission: EM | Admit: 2023-07-02 | Discharge: 2023-07-04 | DRG: 253 | Disposition: A | Discharge status: Home / Self Care | Payer: 59 | Attending: Internal Medicine | Admitting: Internal Medicine

## 2023-07-02 ENCOUNTER — Other Ambulatory Visit: Payer: Self-pay

## 2023-07-02 DIAGNOSIS — I10 Essential (primary) hypertension: Secondary | ICD-10-CM | POA: Diagnosis present

## 2023-07-02 DIAGNOSIS — I714 Abdominal aortic aneurysm, without rupture, unspecified: Secondary | ICD-10-CM | POA: Diagnosis present

## 2023-07-02 DIAGNOSIS — F418 Other specified anxiety disorders: Secondary | ICD-10-CM | POA: Diagnosis present

## 2023-07-02 DIAGNOSIS — N182 Chronic kidney disease, stage 2 (mild): Secondary | ICD-10-CM | POA: Diagnosis present

## 2023-07-02 DIAGNOSIS — E872 Acidosis, unspecified: Secondary | ICD-10-CM | POA: Diagnosis present

## 2023-07-02 DIAGNOSIS — L02612 Cutaneous abscess of left foot: Secondary | ICD-10-CM | POA: Diagnosis not present

## 2023-07-02 DIAGNOSIS — N179 Acute kidney failure, unspecified: Secondary | ICD-10-CM | POA: Diagnosis present

## 2023-07-02 DIAGNOSIS — J9611 Chronic respiratory failure with hypoxia: Secondary | ICD-10-CM | POA: Diagnosis present

## 2023-07-02 DIAGNOSIS — G4730 Sleep apnea, unspecified: Secondary | ICD-10-CM | POA: Diagnosis present

## 2023-07-02 DIAGNOSIS — I130 Hypertensive heart and chronic kidney disease with heart failure and stage 1 through stage 4 chronic kidney disease, or unspecified chronic kidney disease: Secondary | ICD-10-CM | POA: Diagnosis present

## 2023-07-02 DIAGNOSIS — R7989 Other specified abnormal findings of blood chemistry: Secondary | ICD-10-CM | POA: Diagnosis not present

## 2023-07-02 DIAGNOSIS — G459 Transient cerebral ischemic attack, unspecified: Secondary | ICD-10-CM | POA: Diagnosis present

## 2023-07-02 DIAGNOSIS — E1169 Type 2 diabetes mellitus with other specified complication: Secondary | ICD-10-CM | POA: Diagnosis present

## 2023-07-02 DIAGNOSIS — J449 Chronic obstructive pulmonary disease, unspecified: Secondary | ICD-10-CM | POA: Diagnosis present

## 2023-07-02 DIAGNOSIS — E1140 Type 2 diabetes mellitus with diabetic neuropathy, unspecified: Secondary | ICD-10-CM | POA: Diagnosis present

## 2023-07-02 DIAGNOSIS — E860 Dehydration: Secondary | ICD-10-CM | POA: Diagnosis present

## 2023-07-02 DIAGNOSIS — L97401 Non-pressure chronic ulcer of unspecified heel and midfoot limited to breakdown of skin: Secondary | ICD-10-CM

## 2023-07-02 DIAGNOSIS — J4489 Other specified chronic obstructive pulmonary disease: Secondary | ICD-10-CM | POA: Diagnosis present

## 2023-07-02 DIAGNOSIS — G894 Chronic pain syndrome: Secondary | ICD-10-CM | POA: Diagnosis present

## 2023-07-02 DIAGNOSIS — E119 Type 2 diabetes mellitus without complications: Secondary | ICD-10-CM | POA: Diagnosis present

## 2023-07-02 DIAGNOSIS — L03116 Cellulitis of left lower limb: Secondary | ICD-10-CM | POA: Diagnosis present

## 2023-07-02 DIAGNOSIS — Z85828 Personal history of other malignant neoplasm of skin: Secondary | ICD-10-CM

## 2023-07-02 DIAGNOSIS — Z9981 Dependence on supplemental oxygen: Secondary | ICD-10-CM

## 2023-07-02 DIAGNOSIS — K219 Gastro-esophageal reflux disease without esophagitis: Secondary | ICD-10-CM | POA: Diagnosis present

## 2023-07-02 DIAGNOSIS — F419 Anxiety disorder, unspecified: Secondary | ICD-10-CM | POA: Diagnosis present

## 2023-07-02 DIAGNOSIS — I723 Aneurysm of iliac artery: Secondary | ICD-10-CM | POA: Diagnosis present

## 2023-07-02 DIAGNOSIS — Z1152 Encounter for screening for COVID-19: Secondary | ICD-10-CM

## 2023-07-02 DIAGNOSIS — M549 Dorsalgia, unspecified: Secondary | ICD-10-CM | POA: Diagnosis present

## 2023-07-02 DIAGNOSIS — E785 Hyperlipidemia, unspecified: Secondary | ICD-10-CM | POA: Diagnosis present

## 2023-07-02 DIAGNOSIS — E1151 Type 2 diabetes mellitus with diabetic peripheral angiopathy without gangrene: Principal | ICD-10-CM | POA: Diagnosis present

## 2023-07-02 DIAGNOSIS — G4733 Obstructive sleep apnea (adult) (pediatric): Secondary | ICD-10-CM | POA: Diagnosis present

## 2023-07-02 DIAGNOSIS — Z7984 Long term (current) use of oral hypoglycemic drugs: Secondary | ICD-10-CM

## 2023-07-02 DIAGNOSIS — M868X7 Other osteomyelitis, ankle and foot: Secondary | ICD-10-CM | POA: Diagnosis present

## 2023-07-02 DIAGNOSIS — I959 Hypotension, unspecified: Secondary | ICD-10-CM | POA: Diagnosis present

## 2023-07-02 DIAGNOSIS — F32A Depression, unspecified: Secondary | ICD-10-CM | POA: Diagnosis present

## 2023-07-02 DIAGNOSIS — Z8249 Family history of ischemic heart disease and other diseases of the circulatory system: Secondary | ICD-10-CM

## 2023-07-02 DIAGNOSIS — M109 Gout, unspecified: Secondary | ICD-10-CM | POA: Diagnosis present

## 2023-07-02 DIAGNOSIS — L97529 Non-pressure chronic ulcer of other part of left foot with unspecified severity: Secondary | ICD-10-CM | POA: Diagnosis present

## 2023-07-02 DIAGNOSIS — R3129 Other microscopic hematuria: Secondary | ICD-10-CM | POA: Diagnosis present

## 2023-07-02 DIAGNOSIS — Z7902 Long term (current) use of antithrombotics/antiplatelets: Secondary | ICD-10-CM

## 2023-07-02 DIAGNOSIS — F17210 Nicotine dependence, cigarettes, uncomplicated: Secondary | ICD-10-CM | POA: Diagnosis present

## 2023-07-02 DIAGNOSIS — E1122 Type 2 diabetes mellitus with diabetic chronic kidney disease: Secondary | ICD-10-CM | POA: Diagnosis present

## 2023-07-02 DIAGNOSIS — L97429 Non-pressure chronic ulcer of left heel and midfoot with unspecified severity: Secondary | ICD-10-CM | POA: Diagnosis present

## 2023-07-02 DIAGNOSIS — M7730 Calcaneal spur, unspecified foot: Secondary | ICD-10-CM | POA: Diagnosis present

## 2023-07-02 DIAGNOSIS — Z91030 Bee allergy status: Secondary | ICD-10-CM

## 2023-07-02 DIAGNOSIS — Z8673 Personal history of transient ischemic attack (TIA), and cerebral infarction without residual deficits: Secondary | ICD-10-CM

## 2023-07-02 DIAGNOSIS — E08621 Diabetes mellitus due to underlying condition with foot ulcer: Secondary | ICD-10-CM

## 2023-07-02 DIAGNOSIS — E11621 Type 2 diabetes mellitus with foot ulcer: Secondary | ICD-10-CM | POA: Diagnosis present

## 2023-07-02 DIAGNOSIS — I5032 Chronic diastolic (congestive) heart failure: Secondary | ICD-10-CM | POA: Diagnosis present

## 2023-07-02 DIAGNOSIS — Q666 Other congenital valgus deformities of feet: Secondary | ICD-10-CM

## 2023-07-02 DIAGNOSIS — Z86718 Personal history of other venous thrombosis and embolism: Secondary | ICD-10-CM

## 2023-07-02 DIAGNOSIS — I739 Peripheral vascular disease, unspecified: Secondary | ICD-10-CM | POA: Diagnosis not present

## 2023-07-02 DIAGNOSIS — L03032 Cellulitis of left toe: Secondary | ICD-10-CM | POA: Diagnosis not present

## 2023-07-02 DIAGNOSIS — R7401 Elevation of levels of liver transaminase levels: Secondary | ICD-10-CM | POA: Diagnosis present

## 2023-07-02 DIAGNOSIS — R197 Diarrhea, unspecified: Secondary | ICD-10-CM | POA: Diagnosis present

## 2023-07-02 DIAGNOSIS — L97426 Non-pressure chronic ulcer of left heel and midfoot with bone involvement without evidence of necrosis: Secondary | ICD-10-CM | POA: Diagnosis not present

## 2023-07-02 DIAGNOSIS — G8929 Other chronic pain: Secondary | ICD-10-CM | POA: Diagnosis present

## 2023-07-02 DIAGNOSIS — Z85118 Personal history of other malignant neoplasm of bronchus and lung: Secondary | ICD-10-CM

## 2023-07-02 DIAGNOSIS — A419 Sepsis, unspecified organism: Principal | ICD-10-CM

## 2023-07-02 DIAGNOSIS — Z79899 Other long term (current) drug therapy: Secondary | ICD-10-CM

## 2023-07-02 DIAGNOSIS — Z7982 Long term (current) use of aspirin: Secondary | ICD-10-CM

## 2023-07-02 LAB — PROTIME-INR
INR: 1.1 (ref 0.8–1.2)
Prothrombin Time: 14 seconds (ref 11.4–15.2)

## 2023-07-02 LAB — URINALYSIS, W/ REFLEX TO CULTURE (INFECTION SUSPECTED)
Bacteria, UA: NONE SEEN
Bilirubin Urine: NEGATIVE
Glucose, UA: NEGATIVE mg/dL
Ketones, ur: NEGATIVE mg/dL
Leukocytes,Ua: NEGATIVE
Nitrite: NEGATIVE
Protein, ur: NEGATIVE mg/dL
Specific Gravity, Urine: 1.009 (ref 1.005–1.030)
pH: 6 (ref 5.0–8.0)

## 2023-07-02 LAB — COMPREHENSIVE METABOLIC PANEL
ALT: 123 U/L — ABNORMAL HIGH (ref 0–44)
AST: 36 U/L (ref 15–41)
Albumin: 3 g/dL — ABNORMAL LOW (ref 3.5–5.0)
Alkaline Phosphatase: 111 U/L (ref 38–126)
Anion gap: 15 (ref 5–15)
BUN: 13 mg/dL (ref 8–23)
CO2: 25 mmol/L (ref 22–32)
Calcium: 8.6 mg/dL — ABNORMAL LOW (ref 8.9–10.3)
Chloride: 94 mmol/L — ABNORMAL LOW (ref 98–111)
Creatinine, Ser: 1.63 mg/dL — ABNORMAL HIGH (ref 0.61–1.24)
GFR, Estimated: 46 mL/min — ABNORMAL LOW (ref >60)
Glucose, Bld: 80 mg/dL (ref 70–99)
Potassium: 3.7 mmol/L (ref 3.5–5.1)
Sodium: 134 mmol/L — ABNORMAL LOW (ref 135–145)
Total Bilirubin: 0.4 mg/dL (ref 0.3–1.2)
Total Protein: 7.3 g/dL (ref 6.5–8.1)

## 2023-07-02 LAB — LACTIC ACID, PLASMA
Lactic Acid, Venous: 4 mmol/L (ref 0.5–1.9)
Lactic Acid, Venous: 3.5 mmol/L (ref 0.5–1.9)
Lactic Acid, Venous: 2.3 mmol/L (ref 0.5–1.9)
Lactic Acid, Venous: 1.8 mmol/L (ref 0.5–1.9)
Lactic Acid, Venous: 1.5 mmol/L (ref 0.5–1.9)

## 2023-07-02 LAB — CBC
HCT: 35.8 % — ABNORMAL LOW (ref 39.0–52.0)
Hemoglobin: 11.3 g/dL — ABNORMAL LOW (ref 13.0–17.0)
MCH: 31.6 pg (ref 26.0–34.0)
MCHC: 31.6 g/dL (ref 30.0–36.0)
MCV: 100 fL (ref 80.0–100.0)
Platelets: 234 10*3/uL (ref 150–400)
RBC: 3.58 MIL/uL — ABNORMAL LOW (ref 4.22–5.81)
RDW: 15.8 % — ABNORMAL HIGH (ref 11.5–15.5)
WBC: 9.3 10*3/uL (ref 4.0–10.5)
nRBC: 0.8 % — ABNORMAL HIGH (ref 0.0–0.2)

## 2023-07-02 LAB — BRAIN NATRIURETIC PEPTIDE: B Natriuretic Peptide: 203.3 pg/mL — ABNORMAL HIGH (ref 0.0–100.0)

## 2023-07-02 LAB — SEDIMENTATION RATE: Sed Rate: 75 mm/hr — ABNORMAL HIGH (ref 0–20)

## 2023-07-02 LAB — PROCALCITONIN: Procalcitonin: 1.17 ng/mL

## 2023-07-02 LAB — HEPATITIS PANEL, ACUTE
HCV Ab: NONREACTIVE
Hep A IgM: NONREACTIVE
Hep B C IgM: NONREACTIVE
Hepatitis B Surface Ag: NONREACTIVE

## 2023-07-02 LAB — CBG MONITORING, ED: Glucose-Capillary: 75 mg/dL (ref 70–99)

## 2023-07-02 LAB — GLUCOSE, CAPILLARY: Glucose-Capillary: 90 mg/dL (ref 70–99)

## 2023-07-02 LAB — APTT: aPTT: 32 seconds (ref 24–36)

## 2023-07-02 LAB — RESP PANEL BY RT-PCR (RSV, FLU A&B, COVID)  RVPGX2
Influenza A by PCR: NEGATIVE
Influenza B by PCR: NEGATIVE
Resp Syncytial Virus by PCR: NEGATIVE
SARS Coronavirus 2 by RT PCR: NEGATIVE

## 2023-07-02 LAB — C-REACTIVE PROTEIN: CRP: 5.8 mg/dL — ABNORMAL HIGH (ref <1.0)

## 2023-07-02 MED ORDER — ALLOPURINOL 100 MG PO TABS
600.0000 mg | ORAL_TABLET | Freq: Every day | ORAL | Status: DC
  Administered 2023-07-03 – 2023-07-04 (×2): 600 mg via ORAL
  Filled 2023-07-02 (×2): qty 6

## 2023-07-02 MED ORDER — ACETAMINOPHEN 325 MG PO TABS: 650.0000 mg | ORAL_TABLET | Freq: Four times a day (QID) | ORAL | Status: DC | PRN

## 2023-07-02 MED ORDER — LACTATED RINGERS IV SOLN: INTRAVENOUS | Status: DC

## 2023-07-02 MED ORDER — EZETIMIBE 10 MG PO TABS
10.0000 mg | ORAL_TABLET | Freq: Every day | ORAL | Status: DC
  Administered 2023-07-04: 10 mg via ORAL
  Filled 2023-07-02 (×2): qty 1

## 2023-07-02 MED ORDER — BUPRENORPHINE HCL-NALOXONE HCL 8-2 MG SL SUBL
1.0000 | SUBLINGUAL_TABLET | Freq: Three times a day (TID) | SUBLINGUAL | Status: DC
  Administered 2023-07-02 – 2023-07-04 (×6): 1 via SUBLINGUAL
  Filled 2023-07-02 (×6): qty 1

## 2023-07-02 MED ORDER — BUSPIRONE HCL 15 MG PO TABS
7.5000 mg | ORAL_TABLET | Freq: Two times a day (BID) | ORAL | Status: DC
  Administered 2023-07-02 – 2023-07-04 (×3): 7.5 mg via ORAL
  Filled 2023-07-02 (×5): qty 1

## 2023-07-02 MED ORDER — SIMVASTATIN 20 MG PO TABS
40.0000 mg | ORAL_TABLET | Freq: Every day | ORAL | Status: DC
  Administered 2023-07-02 – 2023-07-04 (×3): 40 mg via ORAL
  Filled 2023-07-02 (×2): qty 2
  Filled 2023-07-02: qty 4

## 2023-07-02 MED ORDER — HYDRALAZINE HCL 20 MG/ML IJ SOLN: 5.0000 mg | INTRAMUSCULAR | Status: DC | PRN

## 2023-07-02 MED ORDER — SODIUM CHLORIDE 0.9 % IV SOLN
2.0000 g | INTRAVENOUS | Status: DC
  Administered 2023-07-02 – 2023-07-03 (×2): 2 g via INTRAVENOUS
  Filled 2023-07-02 (×3): qty 20

## 2023-07-02 MED ORDER — SODIUM CHLORIDE 0.9 % IV BOLUS
1000.0000 mL | Freq: Once | INTRAVENOUS | Status: AC
  Administered 2023-07-02: 1000 mL via INTRAVENOUS

## 2023-07-02 MED ORDER — VANCOMYCIN HCL 1500 MG/300ML IV SOLN
1500.0000 mg | Freq: Once | INTRAVENOUS | Status: AC
  Administered 2023-07-02: 1500 mg via INTRAVENOUS
  Filled 2023-07-02: qty 300

## 2023-07-02 MED ORDER — HEPARIN SODIUM (PORCINE) 5000 UNIT/ML IJ SOLN
5000.0000 [IU] | Freq: Three times a day (TID) | INTRAMUSCULAR | Status: DC
  Administered 2023-07-02 – 2023-07-04 (×5): 5000 [IU] via SUBCUTANEOUS
  Filled 2023-07-02 (×5): qty 1

## 2023-07-02 MED ORDER — ONDANSETRON HCL 4 MG/2ML IJ SOLN: 4.0000 mg | Freq: Three times a day (TID) | INTRAMUSCULAR | Status: DC | PRN

## 2023-07-02 MED ORDER — DM-GUAIFENESIN ER 30-600 MG PO TB12: 1.0000 | ORAL_TABLET | Freq: Two times a day (BID) | ORAL | Status: DC | PRN

## 2023-07-02 MED ORDER — PANTOPRAZOLE SODIUM 40 MG PO TBEC
40.0000 mg | DELAYED_RELEASE_TABLET | Freq: Every day | ORAL | Status: DC
  Administered 2023-07-02 – 2023-07-04 (×3): 40 mg via ORAL
  Filled 2023-07-02 (×3): qty 1

## 2023-07-02 MED ORDER — ROFLUMILAST 500 MCG PO TABS
500.0000 ug | ORAL_TABLET | Freq: Every day | ORAL | Status: DC
  Administered 2023-07-04: 500 ug via ORAL
  Filled 2023-07-02 (×2): qty 1

## 2023-07-02 MED ORDER — UMECLIDINIUM BROMIDE 62.5 MCG/ACT IN AEPB
1.0000 | INHALATION_SPRAY | Freq: Every day | RESPIRATORY_TRACT | Status: DC
  Administered 2023-07-03 – 2023-07-04 (×2): 1 via RESPIRATORY_TRACT
  Filled 2023-07-02 (×2): qty 7

## 2023-07-02 MED ORDER — ASPIRIN 81 MG PO TBEC
81.0000 mg | DELAYED_RELEASE_TABLET | Freq: Every day | ORAL | Status: DC
  Administered 2023-07-03 – 2023-07-04 (×2): 81 mg via ORAL
  Filled 2023-07-02 (×2): qty 1

## 2023-07-02 MED ORDER — SODIUM CHLORIDE 0.9 % IV SOLN: INTRAVENOUS | Status: DC

## 2023-07-02 MED ORDER — GABAPENTIN 400 MG PO CAPS
800.0000 mg | ORAL_CAPSULE | Freq: Four times a day (QID) | ORAL | Status: DC
  Administered 2023-07-02 – 2023-07-04 (×6): 800 mg via ORAL
  Filled 2023-07-02 (×4): qty 2
  Filled 2023-07-02: qty 8
  Filled 2023-07-02 (×2): qty 2
  Filled 2023-07-02: qty 8
  Filled 2023-07-02 (×4): qty 2

## 2023-07-02 MED ORDER — DULOXETINE HCL 30 MG PO CPEP
30.0000 mg | ORAL_CAPSULE | Freq: Every day | ORAL | Status: DC
  Administered 2023-07-03 – 2023-07-04 (×2): 30 mg via ORAL
  Filled 2023-07-02 (×2): qty 1

## 2023-07-02 MED ORDER — INSULIN ASPART 100 UNIT/ML IJ SOLN: 0.0000 [IU] | Freq: Three times a day (TID) | INTRAMUSCULAR | Status: DC

## 2023-07-02 MED ORDER — FLUTICASONE FUROATE-VILANTEROL 100-25 MCG/ACT IN AEPB
1.0000 | INHALATION_SPRAY | Freq: Every day | RESPIRATORY_TRACT | Status: DC
  Administered 2023-07-03 – 2023-07-04 (×2): 1 via RESPIRATORY_TRACT
  Filled 2023-07-02 (×2): qty 28

## 2023-07-02 MED ORDER — ALBUTEROL SULFATE (2.5 MG/3ML) 0.083% IN NEBU: 2.5000 mg | INHALATION_SOLUTION | RESPIRATORY_TRACT | Status: DC | PRN

## 2023-07-02 MED ORDER — INSULIN ASPART 100 UNIT/ML IJ SOLN: 0.0000 [IU] | Freq: Every day | INTRAMUSCULAR | Status: DC

## 2023-07-02 NOTE — ED Triage Notes (Signed)
Pt arrived via EMS from home. Pt sts that he has been having low BP. He sts that the nurse came to his house this AM and found that his BP was low. Pt sts that he is normally on 3l/min via [place] at home regularly.

## 2023-07-02 NOTE — Progress Notes (Signed)
CODE SEPSIS - PHARMACY COMMUNICATION  **Broad Spectrum Antibiotics should be administered within 1 hour of Sepsis diagnosis**  Time Code Sepsis Called/Page Received: 1156  Antibiotics Ordered: ceftriaxone   Time of 1st antibiotic administration: 1223  Additional action taken by pharmacy: none required  If necessary, Name of Provider/Nurse Contacted: N/A    Lowella Bandy ,PharmD Clinical Pharmacist  07/02/2023  12:02 PM

## 2023-07-02 NOTE — ED Notes (Signed)
Oxygen titrated down to 2L Christian Fields per pt request. Pt reporting his dr wants him between 88-95%.

## 2023-07-02 NOTE — H&P (Signed)
History and Physical    Christian Fields UEA:[id number] DOB: September 21, 1956 DOA: 07/02/2023  Referring MD/NP/PA:   PCP: Alease Medina, MD   Patient coming from:  The patient is coming from home.     Chief Complaint: fatigue, low blood pressure and left lower leg pain  HPI: Christian Fields is a 67 y.o. male with medical history significant of chronic left foot heel ulcer, COPD on 2-3 L oxygen, hypertension, hyperlipidemia, diabetes mellitus, asthma, TIA, GERD, gout, depression, OSA not on CPAP, anemia, AAA, polysubstance abuse, chronic pain syndrome on Suboxone, non-small cell lung cancer (s/p of RXT), tobacco abuse, CKD-2, who presents with fatigue, low blood pressure, left lower leg pain.  Pt states that he feels weak and fatigue today. Home health care nurse found that pt has low Bp, with SBP 68 per report and called EMS.  Patient stated he has lightheadedness.  His blood pressure was 85/76 in ED, which improved to 113/66 after giving 1L of LR bolus in ED. He has chronic left foot heel ulcer. He states that he has pain in his left lower leg, which is mild to moderate, aching, nonradiating. His left lower leg is erythematous, particularly in the medial malleolus area.  No fever or chills.  Patient does not have chest pain, cough, shortness of breath.  He does not have nausea, vomiting or abdominal pain.  He states he has diarrhea in the past several days, with 2 or 3 times of loose stool bowel movement each day.  Patient is currently taking Bactrim at home   Data reviewed independently and ED Course: pt was found to have WBC 9.3, lactic acid of 4.0, worsening renal function, negative COVID PCR, temperature normal, RR 20, heart rate 79, oxygen sat 93-98% on room air.  Patient is admitted to telemetry bed as inpatient. Dr. Alberteen Spindle of podiatry is consulted.  X-ray of left foot:    1. Mild lucency within the heel soft tissues plantar to the calcaneal heel spur consistent with a ulceration seen on recent  MRI. No definitive cortical erosion is identified, however note is made the more sensitive MRI head findings concerning for early osteomyelitis of the plantar calcaneus. No subcutaneous air. 2. There is a horizontal lucency within the distal dorsal aspect of the cuneiforms on lateral view which may represent an acute to subacute fracture, although no trauma is reported. This could represent a chronic finding. 3. There is new high-grade plantar and lateral angulation of the great toe distal phalanx with possible fusion at the interphalangeal joint. 4. Increased moderate plantar calcaneal heel spur.   CXR: Hyperinflation with chronic changes. Stable nodularity along the peripheral left upper lobe compared to recent CT.      EKG: I have personally reviewed.  Sinus rhythm, QTc 459, I will LAE, LAD, poor R wave progression   Review of Systems:   General: no fevers, chills, no body weight gain, has fatigue HEENT: no blurry vision, hearing changes or sore throat Respiratory: no dyspnea, coughing, wheezing CV: no chest pain, no palpitations GI: no nausea, vomiting, abdominal pain, has diarrhea, no constipation GU: no dysuria, burning on urination, increased urinary frequency, hematuria  Ext: has leg edema Neuro: no unilateral weakness, numbness, or tingling, no vision change or hearing loss Skin: has left foot ulcer MSK: No muscle spasm, no deformity, no limitation of range of movement in spin Heme: No easy bruising.  Travel history: No recent long distant travel.   Allergy:  Allergies  Allergen Reactions  Bee Venom Anaphylaxis    Past Medical History:  Diagnosis Date   Anemia    Anxiety    Arthritis    Asthma    Cancer (HCC)    Basal Cell Skin Cancer   Chronic back pain    COPD (chronic obstructive pulmonary disease) (HCC)    Depression    Diabetes mellitus (HCC)    Dyspnea    GERD (gastroesophageal reflux disease)    Gout    Gout    Headache    History of  blood clots    Left Leg--July 2018   History of kidney stones    Hyperlipidemia    Hyperlipidemia    Hypertension    Kidney stones    Neuropathy    On home oxygen therapy    2 L / M   Pneumonia 06/2017   Sleep apnea    Ulcer of foot (HCC)    Right    Past Surgical History:  Procedure Laterality Date   APPENDECTOMY     DG FEET 2 VIEWS BILAT     LIPOMA EXCISION Right 08/15/2017   Procedure: EXCISION TUMOR(CYST) FOOT;  Surgeon: Recardo Evangelist, DPM;  Location: [facility] ORS;  Service: Podiatry;  Laterality: Right;   OTHER SURGICAL HISTORY Bilateral Foot surgery    Social History:  reports that he has quit smoking. His smoking use included cigarettes. He started smoking about 6 years ago. He has a 3.3 pack-year smoking history. He has never used smokeless tobacco. He reports current alcohol use of about 1.0 standard drink of alcohol per week. He reports that he does not use drugs.  Family History:  Family History  Problem Relation Age of Onset   Hypertension Mother      Prior to Admission medications   Medication Sig Start Date End Date Taking? Authorizing Provider  acetaminophen (TYLENOL) 325 MG tablet Take 650 mg by mouth every 6 (six) hours as needed.    [provider]  albuterol (VENTOLIN HFA) 108 (90 Base) MCG/ACT inhaler Inhale 2 puffs into the lungs every 4 (four) hours as needed for wheezing or shortness of breath.    [provider]  allopurinol (ZYLOPRIM) 300 MG tablet Take 600 mg by mouth daily. 02/24/20   [provider]  aspirin EC 81 MG tablet Take 1 tablet by mouth daily.    [provider]  buprenorphine-naloxone (SUBOXONE) 8-2 mg SUBL SL tablet Place 1 tablet under the tongue in the morning, at noon, and at bedtime.    [provider]  cholecalciferol (VITAMIN D) 1000 units tablet Take 1,000 Units by mouth daily. Patient not taking: Reported on 10/25/2022    [provider]  clonazePAM (KLONOPIN) 1 MG tablet Take 1  mg by mouth 2 (two) times daily as needed for anxiety. Patient not taking: Reported on 10/25/2022    [provider]  clopidogrel (PLAVIX) 75 MG tablet Take 1 tablet (75 mg total) by mouth daily. 02/02/21   Willeen Niece, MD  cyanocobalamin (VITAMIN B12) 1000 MCG/ML injection Inject 1 mL (1,000 mcg total) into the muscle every 30 (thirty) days. 07/12/22   Earna Coder, MD  doxycycline (VIBRA-TABS) 100 MG tablet Take 1 tablet (100 mg total) by mouth 2 (two) times daily. Patient not taking: Reported on 10/25/2022 06/10/22   Creig Hines, MD  DULoxetine (CYMBALTA) 30 MG capsule Take 1 capsule by mouth daily. 01/17/16   [provider]  ezetimibe (ZETIA) 10 MG tablet Take 10 mg by mouth daily.  02/24/20   [provider]  FARXIGA 10 MG TABS tablet Take 10 mg by mouth daily. 09/14/20   [provider]  Fluticasone-Umeclidin-Vilant (TRELEGY ELLIPTA) 100-62.5-25 MCG/ACT AEPB Inhale into the lungs. 09/12/21   [provider]  gabapentin (NEURONTIN) 800 MG tablet Take 800 mg by mouth 4 (four) times daily. 07/14/21   [provider]  metFORMIN (GLUCOPHAGE) 1000 MG tablet Take 1,000 mg by mouth 2 (two) times daily with a meal.    [provider]  metoprolol succinate (TOPROL-XL) 50 MG 24 hr tablet Take 50 mg by mouth daily. 02/24/20   [provider]  OXYGEN Inhale 2 L into the lungs.    [provider]  pantoprazole (PROTONIX) 40 MG tablet Take 40 mg by mouth daily. Patient not taking: Reported on 10/25/2022 06/20/21   [provider]  roflumilast (DALIRESP) 500 MCG TABS tablet Take by mouth. 08/15/21   [provider]  simvastatin (ZOCOR) 40 MG tablet Take 1 tablet by mouth daily. 01/17/16   [provider]  sulfamethoxazole-trimethoprim (BACTRIM DS) 800-160 MG tablet Take 1 tablet by mouth 2 (two) times daily.    [provider]  Syringe/Needle, Disp, (SYRINGE 3CC/25GX1") 25G X 1" 3 ML MISC 1  Syringe by Does not apply route every 30 (thirty) days. 07/12/22   Earna Coder, MD    Physical Exam: Vitals:   07/02/23 1330 07/02/23 1400 07/02/23 1430 07/02/23 1730  BP: 118/66 122/74 110/62 115/69  Pulse: 87 (!) 106 95 90  Resp: 12 16 13 14   Temp:    98 F (36.7 C)  TempSrc:    Oral  SpO2: 97% 92% 96% 96%  Weight:      Height:       General: Not in acute distress HEENT:       Eyes: PERRL, EOMI, no jaundice       ENT: No discharge from the ears and nose, no pharynx injection, no tonsillar enlargement.        Neck: No JVD, no bruit, no mass felt. Heme: No neck lymph node enlargement. Cardiac: S1/S2, RRR, No murmurs, No gallops or rubs. Respiratory: No rales, wheezing, rhonchi or rubs. GI: Soft, nondistended, nontender, no rebound pain, no organomegaly, BS present. GU: No hematuria Ext: Has mild tenderness, swelling, erythema in both lower legs, the left leg is worsening and the right.  Has warmth in left lower leg, but no warmth on the right lower leg. Has a chronic left heel ulcer without drainage. Has erythema in the medial side of left foot.          Musculoskeletal: No joint deformities, No joint redness or warmth, no limitation of ROM in spin. Skin: No rashes.  Neuro: Alert, oriented X3, cranial nerves II-XII grossly intact, moves all extremities normally.  Psych: Patient is not psychotic, no suicidal or hemocidal ideation.  Labs on Admission: I have personally reviewed following labs and imaging studies  CBC: Recent Labs  Lab 07/02/23 1206  WBC 9.3  HGB 11.3*  HCT 35.8*  MCV 100.0  PLT 234   Basic Metabolic Panel: Recent Labs  Lab 07/02/23 1206  NA 134*  K 3.7  CL 94*  CO2 25  GLUCOSE 80  BUN 13  CREATININE 1.63*  CALCIUM 8.6*   GFR: Estimated Creatinine Clearance: 39.1 mL/min (A) (by C-G formula based on SCr of 1.63 mg/dL (H)). Liver Function Tests: Recent Labs  Lab 07/02/23 1206  AST 36  ALT 123*  ALKPHOS 111  BILITOT 0.4   PROT 7.3  ALBUMIN 3.0*   No results for input(s): "LIPASE", "AMYLASE" in the last 168 hours. No results for input(s): "AMMONIA" in the last 168 hours. Coagulation Profile: Recent Labs  Lab 07/02/23 1206  INR 1.1   Cardiac Enzymes: No results for input(s): "CKTOTAL", "CKMB", "CKMBINDEX", "TROPONINI" in the last 168 hours. BNP (last 3 results) No results for input(s): "PROBNP" in the last 8760 hours. HbA1C: No results for input(s): "HGBA1C" in the last 72 hours. CBG: Recent Labs  Lab 07/02/23 1627  GLUCAP 75   Lipid Profile: No results for input(s): "CHOL", "HDL", "LDLCALC", "TRIG", "CHOLHDL", "LDLDIRECT" in the last 72 hours. Thyroid Function Tests: No results for input(s): "TSH", "T4TOTAL", "FREET4", "T3FREE", "THYROIDAB" in the last 72 hours. Anemia Panel: No results for input(s): "VITAMINB12", "FOLATE", "FERRITIN", "TIBC", "IRON", "RETICCTPCT" in the last 72 hours. Urine analysis:    Component Value Date/Time   COLORURINE YELLOW (A) 07/02/2023 1406   APPEARANCEUR CLEAR (A) 07/02/2023 1406   APPEARANCEUR Clear 03/24/2013 1853   LABSPEC 1.009 07/02/2023 1406   LABSPEC 1.020 03/24/2013 1853   PHURINE 6.0 07/02/2023 1406   GLUCOSEU NEGATIVE 07/02/2023 1406   GLUCOSEU Negative 03/24/2013 1853   HGBUR LARGE (A) 07/02/2023 1406   BILIRUBINUR NEGATIVE 07/02/2023 1406   BILIRUBINUR Negative 03/24/2013 1853   KETONESUR NEGATIVE 07/02/2023 1406   PROTEINUR NEGATIVE 07/02/2023 1406   NITRITE NEGATIVE 07/02/2023 1406   LEUKOCYTESUR NEGATIVE 07/02/2023 1406   LEUKOCYTESUR Negative 03/24/2013 1853   Sepsis Labs: @LABRCNTIP (procalcitonin:4,lacticidven:4) ) Recent Results (from the past 240 hour(s))  Resp panel by RT-PCR (RSV, Flu A&B, Covid) Anterior Nasal Swab     Status: None   Collection Time: 07/02/23 12:06 PM   Specimen: Anterior Nasal Swab  Result Value Ref Range Status   SARS Coronavirus 2 by RT PCR NEGATIVE NEGATIVE Final    Comment: (NOTE) SARS-CoV-2 target  nucleic acids are NOT DETECTED.  The SARS-CoV-2 RNA is generally detectable in upper respiratory specimens during the acute phase of infection. The lowest concentration of SARS-CoV-2 viral copies this assay can detect is 138 copies/mL. A negative result does not preclude SARS-Cov-2 infection and should not be used as the sole basis for treatment or other patient management decisions. A negative result may occur with  improper specimen collection/handling, submission of specimen other than nasopharyngeal swab, presence of viral mutation(s) within the areas targeted by this assay, and inadequate number of viral copies(<138 copies/mL). A negative result must be combined with clinical observations, patient history, and epidemiological information. The expected result is Negative.  Fact Sheet for Patients:  BloggerCourse.com  Fact Sheet for Healthcare Providers:  SeriousBroker.it  This test is no t yet approved or cleared by the [place] FDA and  has been authorized for detection and/or diagnosis of SARS-CoV-2 by FDA under an Emergency Use Authorization (EUA). This EUA will remain  in effect (meaning this test can be used) for the duration of the COVID-19 declaration under Section 564(b)(1) of the Act, 21 U.S.C.section 360bbb-3(b)(1), unless the authorization is terminated  or revoked sooner.       Influenza A by PCR NEGATIVE NEGATIVE Final   Influenza B by PCR NEGATIVE NEGATIVE Final    Comment: (NOTE) The Xpert Xpress SARS-CoV-2/FLU/RSV plus assay is intended as an aid in the diagnosis of influenza from Nasopharyngeal swab specimens and should not be used as a sole basis for treatment. Nasal washings and aspirates are unacceptable for Xpert Xpress SARS-CoV-2/FLU/RSV testing.  Fact Sheet for Patients: BloggerCourse.com  Fact Sheet for Healthcare  Providers: SeriousBroker.it  This test is not yet approved or cleared by the [place] FDA and has been authorized for detection and/or diagnosis of SARS-CoV-2 by FDA under an Emergency Use Authorization (EUA). This EUA will remain in effect (meaning this test can be used) for the duration of the COVID-19 declaration under Section 564(b)(1) of the Act, 21 U.S.C. section 360bbb-3(b)(1), unless the authorization is terminated or revoked.     Resp Syncytial Virus by PCR NEGATIVE NEGATIVE Final    Comment: (NOTE) Fact Sheet for Patients: BloggerCourse.com  Fact Sheet for Healthcare Providers: SeriousBroker.it  This test is not yet approved or cleared by the [place] FDA and has been authorized for detection and/or diagnosis of SARS-CoV-2 by FDA under an Emergency Use Authorization (EUA). This EUA will remain in effect (meaning this test can be used) for the duration of the COVID-19 declaration under Section 564(b)(1) of the Act, 21 U.S.C. section 360bbb-3(b)(1), unless the authorization is terminated or revoked.  Performed at [facility], [address] [place], [place] [address]      Radiological Exams on Admission: DG Foot Complete Left  Result Date: 07/02/2023 CLINICAL DATA:  Chronic ulcers to heel and medial foot with worsening surrounding cellulitis. EXAM: LEFT FOOT - COMPLETE 3+ VIEW COMPARISON:  Left foot radiographs 05/11/2014; MRI left ankle 06/23/2023 FINDINGS: There is diffuse decreased bone mineralization. There is plate and screw fixation of the previously seen comminuted navicular fracture on 05/11/2014 radiographs. Increased moderate plantar calcaneal heel spur. Chronic 8 mm well corticated ossicle dorsal to the talar neck. There appears to be new mild collapse of the talar neck with respect to the remote navicular fracture. There is a horizontal lucency within the distal  dorsal aspect of the cuneiforms on lateral view which may represent an acute to subacute fracture, although no trauma is reported. There is new high-grade plantar and lateral angulation of the great toe distal phalanx with possible fusion at the interphalangeal joint. Mild lucency within the heel soft tissues plantar to the calcaneal heel spur. No definitive cortical erosion is identified, however note is made that marrow edema and likely early cortical erosion were seen within the plantar aspect of the calcaneus on recent 06/23/2023 MRI. No subcutaneous air. IMPRESSION: Compared to 05/11/2014: 1. Mild lucency within the heel soft tissues plantar to the calcaneal heel spur consistent with a ulceration seen on recent MRI. No definitive cortical erosion is identified, however note is made the more sensitive MRI head findings concerning for early osteomyelitis of the plantar calcaneus. No subcutaneous air. 2. There is a horizontal lucency within the distal dorsal aspect of the cuneiforms on lateral view which may represent an acute to subacute fracture, although no trauma is reported. This could represent a chronic finding. 3. There is new high-grade plantar and lateral angulation of the great toe distal phalanx with possible fusion at the interphalangeal joint. 4. Increased moderate plantar calcaneal heel spur. Electronically Signed   By: Neita Garnet M.D.   On: 07/02/2023 15:19   DG Chest Port 1 View  Result Date: 07/02/2023 CLINICAL DATA:  Sepsis.  Lung cancer. EXAM: PORTABLE CHEST 1 VIEW COMPARISON:  08/05/2021 x-ray.  CT 01/24/2023 FINDINGS: Hyperinflation. Chronic lung changes identified. Smaller area of nodularity along the peripheral left upper lobe is similar to the prior CT scan. No pneumothorax, effusion or edema. Normal cardiopericardial silhouette. Calcified aorta. Overlapping cardiac leads. Enlarged pulmonary arteries again seen as on prior CT. Overlapping cardiac leads. IMPRESSION:  Hyperinflation  with chronic changes. Stable nodularity along the peripheral left upper lobe compared to recent CT. Electronically Signed   By: Karen Kays M.D.   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  Consulted Dr. Alberteen Spindle of podiatry.  - will admit to tele bed  as inpatient - Empiric antimicrobial treatment with vancomycin and Rocephin - Blood cultures x 2  - ESR and CRP - wound care consult - will get Procalcitonin and trend lactic acid levels - IVF: 1.0 L of NS bolus in ED, followed by 75 cc/h - LE doppler to r/o DVT  Elevated lactic acid level: Lactic acid 4.0, does not meet criteria for sepsis. -IV fluid as above -Treat lactic acid level  Acute renal failure superimposed on stage 2 chronic kidney disease (HCC): Recent baseline creatinine 1.05 on 05/27/2023.  His creatinine is 1.63, BUN 13, GFR 46.  Likely multifactorial etiology, including Bactrim use, dehydration and continuation of HCTZ -Hold hydrochlorothiazide -IV fluid as above  Chronic diastolic CHF (congestive heart failure) (HCC): 2D echo on 04/21/2023 showed EF> 55%.  Patient has leg edema, BNP is minimally elevated 203, no shortness of breath.  No oxygen desaturation, does not  seem to have CHF exacerbation. -Watch volume status closely  Type 2 diabetes mellitus with foot ulcer (CODE) (HCC): Recent A1c 4.9, well-controlled.  Patient taking metformin and Farxiga -SSI  Hypertension -IV hydralazine as needed -Hold blood pressure medications including metoprolol, HCTZ due to softer blood pressure  HLD (hyperlipidemia) -Zocor and Zetia  Abnormal LFTs: ALP 111, AST 36, ALT 123, total bili 0.4.  Possibly due to alcohol use.  Patient states that he drinks liquor 2 or 3 times each week, currently no signs of withdrawal. -Did counseling about importance of quitting alcohol -Check hepatitis panel -Careful use of Tylenol (cannot use NSAIDs due to worsening renal function)  Chronic pain syndrome -Continue home Suboxone  COPD (chronic obstructive pulmonary disease) (HCC) -Bronchodilators  Depression with anxiety -Continue home medications  TIA (transient ischemic attack) -Zocor and Zetia -Hold Plavix in case patient need procedure  Sleep apnea -Currently not using CPAP  Diarrhea: -Follow-up C. difficile     DVT ppx: SQ Heparin   Code Status: Full code    Family Communication: not done, no family member is at bed side.    Disposition Plan:  Anticipate discharge back to previous environment  Consults called:  Dr. Alberteen Spindle of podiatry is consulted.  Admission status and Level of care: Telemetry Medical:    as inpt       Dispo: The patient is from: Home              Anticipated d/c is to: Home  Anticipated d/c date is: 2 days              Patient currently is not medically stable to d/c.    Severity of Illness:  The appropriate patient status for this patient is INPATIENT. Inpatient status is judged to be reasonable and necessary in order to provide the required intensity of service to ensure the patient's safety. The patient's presenting symptoms, physical exam findings, and initial radiographic and laboratory data in the context of  their chronic comorbidities is felt to place them at high risk for further clinical deterioration. Furthermore, it is not anticipated that the patient will be medically stable for discharge from the hospital within 2 midnights of admission.   * I certify that at the point of admission it is my clinical judgment that the patient will require inpatient hospital care spanning beyond 2 midnights from the point of admission due to high intensity of service, high risk for further deterioration and high frequency of surveillance required.*       Date of Service 07/02/2023    Lorretta Harp [facility]   If 7PM-7AM, please contact night-coverage [email or web address] 07/02/2023, 6:42 PM

## 2023-07-02 NOTE — ED Notes (Signed)
First nurse note: Pt to ED [facility] from home Home health care nurse called EMS for dizziness, hypotension when pt sat up after lying (SBP 68 per that RN)  Open diabetic sore to L ankle, dressed today. No oral intake or meds taken today  EMS VS: 97/64, HR 79. NSR, wears 3L chronic, 93% on 3L 3 EMS IV attempts

## 2023-07-02 NOTE — Progress Notes (Signed)
PHARMACY -  BRIEF ANTIBIOTIC NOTE   Pharmacy has received consult(s) for vancomycin from an ED provider.  The patient's profile has been reviewed for ht/wt/allergies/indication/available labs.    One time order(s) placed for vancomycin 1500mg  IV  Further antibiotics/pharmacy consults should be ordered by admitting physician if indicated.                       Thank you, Christian Fields 07/02/2023  2:43 PM

## 2023-07-02 NOTE — Sepsis Progress Note (Signed)
[facility] will follow per sepsis protocol  

## 2023-07-02 NOTE — ED Provider Notes (Signed)
[facility] Provider Note   Event Date/Time   First MD Initiated Contact with Patient 07/02/23 1142     (approximate) History  Hypotension  HPI Christian Fields is a 67 y.o. male with a past medical history of type 2 diabetes and chronic left lower extremity heel and side of the foot wound who presents complaining of hypotension.  Patient states that a home health care nurse came to his house this morning and found that his blood pressure was abnormally low.  Patient has a history of COPD and is on chronic 3 L nasal cannula at home.  Patient also noted a new swelling, pain, and redness to the medial aspect of the left foot when his dressing was being changed this morning. ROS: Patient currently denies any vision changes, tinnitus, difficulty speaking, facial droop, sore throat, chest pain, shortness of breath, abdominal pain, nausea/vomiting/diarrhea, dysuria, or weakness/numbness/paresthesias in any extremity   Physical Exam  Triage Vital Signs: ED Triage Vitals [07/02/23 1138]  Encounter Vitals Group     BP (!) 89/64     Systolic BP Percentile      Diastolic BP Percentile      Pulse Rate 73     Resp 18     Temp 98.3 F (36.8 C)     Temp Source Oral     SpO2 93 %     Weight 158 lb (71.7 kg)     Height 5\' 6"  (1.676 m)     Head Circumference      Peak Flow      Pain Score 0     Pain Loc      Pain Education      Exclude from Growth Chart    Most recent vital signs: Vitals:   07/02/23 1400 07/02/23 1430  BP: 122/74 110/62  Pulse: (!) 106 95  Resp: 16 13  Temp:    SpO2: 92% 96%   General: Awake, oriented x4. CV:  Good peripheral perfusion.  Resp:  Normal effort.  Abd:  No distention.  Other:  Middle-aged overweight Caucasian male laying in stretcher in no acute distress.  There is a 1.5 cm diameter ulceration to the heel that has a small amount of white drainage.  There is also a 1 cm diameter area on the medial aspect of the heel with 2 to 3 cm  diameter area of induration and erythema surrounding it the patient states is new ED Results / Procedures / Treatments  Labs (all labs ordered are listed, but only abnormal results are displayed) Labs Reviewed  CBC - Abnormal; Notable for the following components:      Result Value   RBC 3.58 (*)    Hemoglobin 11.3 (*)    HCT 35.8 (*)    RDW 15.8 (*)    nRBC 0.8 (*)    All other components within normal limits  COMPREHENSIVE METABOLIC PANEL - Abnormal; Notable for the following components:   Sodium 134 (*)    Chloride 94 (*)    Creatinine, Ser 1.63 (*)    Calcium 8.6 (*)    Albumin 3.0 (*)    ALT 123 (*)    GFR, Estimated 46 (*)    All other components within normal limits  LACTIC ACID, PLASMA - Abnormal; Notable for the following components:   Lactic Acid, Venous 4.0 (*)    All other components within normal limits  LACTIC ACID, PLASMA - Abnormal; Notable for the following components:   Lactic  Acid, Venous 3.5 (*)    All other components within normal limits  URINALYSIS, W/ REFLEX TO CULTURE (INFECTION SUSPECTED) - Abnormal; Notable for the following components:   Color, Urine YELLOW (*)    APPearance CLEAR (*)    Hgb urine dipstick LARGE (*)    All other components within normal limits  BRAIN NATRIURETIC PEPTIDE - Abnormal; Notable for the following components:   B Natriuretic Peptide 203.3 (*)    All other components within normal limits  SEDIMENTATION RATE - Abnormal; Notable for the following components:   Sed Rate 75 (*)    All other components within normal limits  RESP PANEL BY RT-PCR (RSV, FLU A&B, COVID)  RVPGX2  CULTURE, BLOOD (ROUTINE X 2)  CULTURE, BLOOD (ROUTINE X 2)  C DIFFICILE QUICK SCREEN W PCR REFLEX    PROTIME-INR  APTT  PROCALCITONIN  HEPATITIS PANEL, ACUTE  C-REACTIVE PROTEIN  LACTIC ACID, PLASMA  LACTIC ACID, PLASMA  LACTIC ACID, PLASMA  HIV ANTIBODY (ROUTINE TESTING W REFLEX)   EKG ED ECG REPORT I, Merwyn Katos, the attending physician,  personally viewed and interpreted this ECG. Date: 07/02/2023 EKG Time: 1147 Rate: 78 Rhythm: normal sinus rhythm QRS Axis: normal Intervals: normal ST/T Wave abnormalities: normal Narrative Interpretation: no evidence of acute ischemia RADIOLOGY ED MD interpretation: One-view portable chest x-ray interpreted by me shows no evidence of acute abnormalities including no pneumonia, pneumothorax, or widened mediastinum  X-ray of the left foot independently interpreted by me shows possible cortical lucency at the heel concerning for osteomyelitis.  Patient has had recent MRI showing concerns for osteomyelitis as well -Agree with radiology assessment Official radiology report(s): DG Foot Complete Left  Result Date: 07/02/2023 CLINICAL DATA:  Chronic ulcers to heel and medial foot with worsening surrounding cellulitis. EXAM: LEFT FOOT - COMPLETE 3+ VIEW COMPARISON:  Left foot radiographs 05/11/2014; MRI left ankle 06/23/2023 FINDINGS: There is diffuse decreased bone mineralization. There is plate and screw fixation of the previously seen comminuted navicular fracture on 05/11/2014 radiographs. Increased moderate plantar calcaneal heel spur. Chronic 8 mm well corticated ossicle dorsal to the talar neck. There appears to be new mild collapse of the talar neck with respect to the remote navicular fracture. There is a horizontal lucency within the distal dorsal aspect of the cuneiforms on lateral view which may represent an acute to subacute fracture, although no trauma is reported. There is new high-grade plantar and lateral angulation of the great toe distal phalanx with possible fusion at the interphalangeal joint. Mild lucency within the heel soft tissues plantar to the calcaneal heel spur. No definitive cortical erosion is identified, however note is made that marrow edema and likely early cortical erosion were seen within the plantar aspect of the calcaneus on recent 06/23/2023 MRI. No subcutaneous air.  IMPRESSION: Compared to 05/11/2014: 1. Mild lucency within the heel soft tissues plantar to the calcaneal heel spur consistent with a ulceration seen on recent MRI. No definitive cortical erosion is identified, however note is made the more sensitive MRI head findings concerning for early osteomyelitis of the plantar calcaneus. No subcutaneous air. 2. There is a horizontal lucency within the distal dorsal aspect of the cuneiforms on lateral view which may represent an acute to subacute fracture, although no trauma is reported. This could represent a chronic finding. 3. There is new high-grade plantar and lateral angulation of the great toe distal phalanx with possible fusion at the interphalangeal joint. 4. Increased moderate plantar calcaneal heel spur. Electronically Signed  By: Neita Garnet M.D.   On: 07/02/2023 15:19   DG Chest Port 1 View  Result Date: 07/02/2023 CLINICAL DATA:  Sepsis.  Lung cancer. EXAM: PORTABLE CHEST 1 VIEW COMPARISON:  08/05/2021 x-ray.  CT 01/24/2023 FINDINGS: Hyperinflation. Chronic lung changes identified. Smaller area of nodularity along the peripheral left upper lobe is similar to the prior CT scan. No pneumothorax, effusion or edema. Normal cardiopericardial silhouette. Calcified aorta. Overlapping cardiac leads. Enlarged pulmonary arteries again seen as on prior CT. Overlapping cardiac leads. IMPRESSION: Hyperinflation with chronic changes. Stable nodularity along the peripheral left upper lobe compared to recent CT. Electronically Signed   By: Karen Kays M.D.   On: 07/02/2023 12:50   PROCEDURES: Critical Care performed: Yes, see critical care procedure note(s) .1-3 Lead EKG Interpretation  Performed by: Merwyn Katos, MD Authorized by: Merwyn Katos, MD     Interpretation: normal     ECG rate:  71   ECG rate assessment: normal     Rhythm: sinus rhythm     Ectopy: none     Conduction: normal   CRITICAL CARE Performed by: Merwyn Katos  Total critical  care time: 41 minutes  Critical care time was exclusive of separately billable procedures and treating other patients.  Critical care was necessary to treat or prevent imminent or life-threatening deterioration.  Critical care was time spent personally by me on the following activities: development of treatment plan with patient and/or surrogate as well as nursing, discussions with consultants, evaluation of patient's response to treatment, examination of patient, obtaining history from patient or surrogate, ordering and performing treatments and interventions, ordering and review of laboratory studies, ordering and review of radiographic studies, pulse oximetry and re-evaluation of patient's condition.  MEDICATIONS ORDERED IN ED: Medications  cefTRIAXone (ROCEPHIN) 2 g in sodium chloride 0.9 % 100 mL IVPB (0 g Intravenous Stopped 07/02/23 1302)  albuterol (PROVENTIL) (2.5 MG/3ML) 0.083% nebulizer solution 2.5 mg (has no administration in time range)  dextromethorphan-guaiFENesin (MUCINEX DM) 30-600 MG per 12 hr tablet 1 tablet (has no administration in time range)  ondansetron (ZOFRAN) injection 4 mg (has no administration in time range)  acetaminophen (TYLENOL) tablet 650 mg (has no administration in time range)  hydrALAZINE (APRESOLINE) injection 5 mg (has no administration in time range)  insulin aspart (novoLOG) injection 0-5 Units (has no administration in time range)  insulin aspart (novoLOG) injection 0-9 Units (has no administration in time range)  lactated ringers infusion ( Intravenous New Bag/Given 07/02/23 1426)  heparin injection 5,000 Units (has no administration in time range)  vancomycin (VANCOREADY) IVPB 1500 mg/300 mL (1,500 mg Intravenous New Bag/Given 07/02/23 1512)  allopurinol (ZYLOPRIM) tablet 600 mg (has no administration in time range)  aspirin EC tablet 81 mg (has no administration in time range)  buprenorphine-naloxone (SUBOXONE) 8-2 mg per SL tablet 1 tablet (has no  administration in time range)  ezetimibe (ZETIA) tablet 10 mg (has no administration in time range)  simvastatin (ZOCOR) tablet 40 mg (has no administration in time range)  busPIRone (BUSPAR) tablet 7.5 mg (has no administration in time range)  DULoxetine (CYMBALTA) DR capsule 30 mg (has no administration in time range)  pantoprazole (PROTONIX) EC tablet 40 mg (has no administration in time range)  gabapentin (NEURONTIN) capsule 800 mg (has no administration in time range)  fluticasone furoate-vilanterol (BREO ELLIPTA) 100-25 MCG/ACT 1 puff (has no administration in time range)    And  umeclidinium bromide (INCRUSE ELLIPTA) 62.5 MCG/ACT 1 puff (has no  administration in time range)  roflumilast (DALIRESP) tablet 500 mcg (has no administration in time range)  sodium chloride 0.9 % bolus 1,000 mL (0 mLs Intravenous Stopped 07/02/23 1405)   IMPRESSION / MDM / ASSESSMENT AND PLAN / ED COURSE  I reviewed the triage vital signs and the nursing notes.                             The patient is on the cardiac monitor to evaluate for evidence of arrhythmia and/or significant heart rate changes. Patient's presentation is most consistent with acute presentation with potential threat to life or bodily function. The Pt presents with hypotension highly concerning for sepsis (suspected skin or soft tissue source). At this time, the Pt is satting well on 3 L, normotensive, and appears HDS.  Will start empiric antibiotics and fluids.  Due to hypotension, will administer fluids gradually with frequent reassessment. Have low suspicion for a GI, skin/soft tissue, or CNS source at this time, but will reconsider if initial workup is unremarkable.  - CBC, BMP, LFTs - VBG - UA - BCx x2, Lactate - EKG - CXR -Left foot x-ray showing possible osteomyelitis - Empiric Abx: Rocephin and vancomycin - Fluids: 30cc/kgNS Spoke to Dr. Youlanda Roys (hospital) who agrees to accept this patient onto their service for further  evaluation and management.   FINAL CLINICAL IMPRESSION(S) / ED DIAGNOSES   Final diagnoses:  Sepsis without acute organ dysfunction, due to unspecified organism (HCC)  Cellulitis and abscess of toe of left foot  AKI (acute kidney injury) (HCC)   Rx / DC Orders   ED Discharge Orders     None      Note:  This document was prepared using Dragon voice recognition software and may include unintentional dictation errors.   Merwyn Katos, MD 07/02/23 [phone]

## 2023-07-02 NOTE — ED Notes (Signed)
RN unable to find a vein for IV placement.

## 2023-07-02 NOTE — Consult Note (Signed)
Reason for Consult: Ulceration with osteomyelitis left heel. Referring Physician: Adem Costlow is an 67 y.o. male.  HPI: This is a 67 year old diabetic male with a chronic history of ulceration on his left heel.  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  Was scheduled for outpatient vascular consult but presented to the hospital today with increasing redness and swelling and drainage.  Past Medical History:  Diagnosis Date   Anemia    Anxiety    Arthritis    Asthma    Cancer (HCC)    Basal Cell Skin Cancer   Chronic back pain    COPD (chronic obstructive pulmonary disease) (HCC)    Depression    Diabetes mellitus (HCC)    Dyspnea    GERD (gastroesophageal reflux disease)    Gout    Gout    Headache    History of blood clots    Left Leg--July 2018   History of kidney stones    Hyperlipidemia    Hyperlipidemia    Hypertension    Kidney stones    Neuropathy    On home oxygen therapy    2 L / M   Pneumonia 06/2017   Sleep apnea    Ulcer of foot (HCC)    Right    Past Surgical History:  Procedure Laterality Date   APPENDECTOMY     DG FEET 2 VIEWS BILAT     LIPOMA EXCISION Right 08/15/2017   Procedure: EXCISION TUMOR(CYST) FOOT;  Surgeon: Recardo Evangelist, DPM;  Location: [facility] ORS;  Service: Podiatry;  Laterality: Right;   OTHER SURGICAL HISTORY Bilateral Foot surgery    Family History  Problem Relation Age of Onset   Hypertension Mother     Social History:  reports that he has quit smoking. His smoking use included cigarettes. He started smoking about 6 years ago. He has a 3.3 pack-year smoking history. He has never used smokeless tobacco. He reports current alcohol use of about 1.0 standard drink of alcohol per week. He reports that he does not use drugs.  Allergies:  Allergies  Allergen Reactions   Bee Venom Anaphylaxis    Medications: Scheduled:  [START  ON 07/03/2023] allopurinol  600 mg Oral Daily   [START ON 07/03/2023] aspirin EC  81 mg Oral Daily   buprenorphine-naloxone  1 tablet Sublingual TID   busPIRone  7.5 mg Oral BID   [START ON 07/03/2023] DULoxetine  30 mg Oral Daily   [START ON 07/03/2023] ezetimibe  10 mg Oral Daily   fluticasone furoate-vilanterol  1 puff Inhalation Daily   And   umeclidinium bromide  1 puff Inhalation Daily   gabapentin  800 mg Oral QID   heparin  5,000 Units Subcutaneous Q8H   insulin aspart  0-5 Units Subcutaneous QHS   insulin aspart  0-9 Units Subcutaneous TID WC   pantoprazole  40 mg Oral Daily   [START ON 07/03/2023] roflumilast  500 mcg Oral Daily   simvastatin  40 mg Oral Daily    Results for orders placed or performed during the hospital encounter of 07/02/23 (from the past 48 hour(s))  CBC     Status: Abnormal   Collection Time: 07/02/23 12:06 PM  Result Value Ref Range   WBC 9.3 4.0 - 10.5 K/uL   RBC 3.58 (L) 4.22 - 5.81 MIL/uL   Hemoglobin 11.3 (L) 13.0 - 17.0 g/dL   HCT 16.1 (  L) 39.0 - 52.0 %   MCV 100.0 80.0 - 100.0 fL   MCH 31.6 26.0 - 34.0 pg   MCHC 31.6 30.0 - 36.0 g/dL   RDW 81.1 (H) 91.4 - 78.2 %   Platelets 234 150 - 400 K/uL   nRBC 0.8 (H) 0.0 - 0.2 %    Comment: Performed at [facility], [address] [place], [place] [address]  Comprehensive metabolic panel     Status: Abnormal   Collection Time: 07/02/23 12:06 PM  Result Value Ref Range   Sodium 134 (L) 135 - 145 mmol/L   Potassium 3.7 3.5 - 5.1 mmol/L   Chloride 94 (L) 98 - 111 mmol/L   CO2 25 22 - 32 mmol/L   Glucose, Bld 80 70 - 99 mg/dL    Comment: Glucose reference range applies only to samples taken after fasting for at least 8 hours.   BUN 13 8 - 23 mg/dL   Creatinine, Ser 3.08 (H) 0.61 - 1.24 mg/dL   Calcium 8.6 (L) 8.9 - 10.3 mg/dL   Total Protein 7.3 6.5 - 8.1 g/dL   Albumin 3.0 (L) 3.5 - 5.0 g/dL   AST 36 15 - 41 U/L   ALT 123 (H) 0 - 44 U/L   Alkaline Phosphatase 111 38 - 126 U/L   Total  Bilirubin 0.4 0.3 - 1.2 mg/dL   GFR, Estimated 46 (L) >60 mL/min    Comment: (NOTE) Calculated using the CKD-EPI Creatinine Equation (2021)    Anion gap 15 5 - 15    Comment: Performed at [facility], [address], [place], [place] [address]  Resp panel by RT-PCR (RSV, Flu A&B, Covid) Anterior Nasal Swab     Status: None   Collection Time: 07/02/23 12:06 PM   Specimen: Anterior Nasal Swab  Result Value Ref Range   SARS Coronavirus 2 by RT PCR NEGATIVE NEGATIVE    Comment: (NOTE) SARS-CoV-2 target nucleic acids are NOT DETECTED.  The SARS-CoV-2 RNA is generally detectable in upper respiratory specimens during the acute phase of infection. The lowest concentration of SARS-CoV-2 viral copies this assay can detect is 138 copies/mL. A negative result does not preclude SARS-Cov-2 infection and should not be used as the sole basis for treatment or other patient management decisions. A negative result may occur with  improper specimen collection/handling, submission of specimen other than nasopharyngeal swab, presence of viral mutation(s) within the areas targeted by this assay, and inadequate number of viral copies(<138 copies/mL). A negative result must be combined with clinical observations, patient history, and epidemiological information. The expected result is Negative.  Fact Sheet for Patients:  BloggerCourse.com  Fact Sheet for Healthcare Providers:  SeriousBroker.it  This test is no t yet approved or cleared by the [place] FDA and  has been authorized for detection and/or diagnosis of SARS-CoV-2 by FDA under an Emergency Use Authorization (EUA). This EUA will remain  in effect (meaning this test can be used) for the duration of the COVID-19 declaration under Section 564(b)(1) of the Act, 21 U.S.C.section 360bbb-3(b)(1), unless the authorization is terminated  or revoked sooner.       Influenza A by PCR  NEGATIVE NEGATIVE   Influenza B by PCR NEGATIVE NEGATIVE    Comment: (NOTE) The Xpert Xpress SARS-CoV-2/FLU/RSV plus assay is intended as an aid in the diagnosis of influenza from Nasopharyngeal swab specimens and should not be used as a sole basis for treatment. Nasal washings and aspirates are unacceptable for Xpert  Xpress SARS-CoV-2/FLU/RSV testing.  Fact Sheet for Patients: BloggerCourse.com  Fact Sheet for Healthcare Providers: SeriousBroker.it  This test is not yet approved or cleared by the [place] FDA and has been authorized for detection and/or diagnosis of SARS-CoV-2 by FDA under an Emergency Use Authorization (EUA). This EUA will remain in effect (meaning this test can be used) for the duration of the COVID-19 declaration under Section 564(b)(1) of the Act, 21 U.S.C. section 360bbb-3(b)(1), unless the authorization is terminated or revoked.     Resp Syncytial Virus by PCR NEGATIVE NEGATIVE    Comment: (NOTE) Fact Sheet for Patients: BloggerCourse.com  Fact Sheet for Healthcare Providers: SeriousBroker.it  This test is not yet approved or cleared by the [place] FDA and has been authorized for detection and/or diagnosis of SARS-CoV-2 by FDA under an Emergency Use Authorization (EUA). This EUA will remain in effect (meaning this test can be used) for the duration of the COVID-19 declaration under Section 564(b)(1) of the Act, 21 U.S.C. section 360bbb-3(b)(1), unless the authorization is terminated or revoked.  Performed at [facility], [address] [place], [place] [address]   Lactic acid, plasma     Status: Abnormal   Collection Time: 07/02/23 12:06 PM  Result Value Ref Range   Lactic Acid, Venous 4.0 (HH) 0.5 - 1.9 mmol/L    Comment: CRITICAL RESULT CALLED TO, READ BACK BY AND VERIFIED WITH JESSICA MADISON 07/02/23 1236 MU Performed at  [facility], [address] [place], [place] [address]   Protime-INR     Status: None   Collection Time: 07/02/23 12:06 PM  Result Value Ref Range   Prothrombin Time 14.0 11.4 - 15.2 seconds   INR 1.1 0.8 - 1.2    Comment: (NOTE) INR goal varies based on device and disease states. Performed at [facility], [address] [place], [place] [address]   APTT     Status: None   Collection Time: 07/02/23 12:06 PM  Result Value Ref Range   aPTT 32 24 - 36 seconds    Comment: Performed at [facility], [address] [place], [place] [address]  Brain natriuretic peptide     Status: Abnormal   Collection Time: 07/02/23 12:06 PM  Result Value Ref Range   B Natriuretic Peptide 203.3 (H) 0.0 - 100.0 pg/mL    Comment: Performed at [facility], [address] [place], [place] [address]  Sedimentation rate     Status: Abnormal   Collection Time: 07/02/23 12:06 PM  Result Value Ref Range   Sed Rate 75 (H) 0 - 20 mm/hr    Comment: Performed at [facility], [address] [place], [place] [address]  Procalcitonin     Status: None   Collection Time: 07/02/23 12:06 PM  Result Value Ref Range   Procalcitonin 1.17 ng/mL    Comment:        Interpretation: PCT > 0.5 ng/mL and <= 2 ng/mL: Systemic infection (sepsis) is possible, but other conditions are known to elevate PCT as well. (NOTE)       Sepsis PCT Algorithm           Lower Respiratory Tract                                      Infection PCT Algorithm    ----------------------------     ----------------------------  PCT < 0.25 ng/mL                PCT < 0.10 ng/mL          Strongly encourage             Strongly discourage   discontinuation of antibiotics    initiation of antibiotics    ----------------------------     -----------------------------       PCT 0.25 - 0.50 ng/mL            PCT 0.10 - 0.25 ng/mL               OR       >80% decrease in PCT             Discourage initiation of                                            antibiotics      Encourage discontinuation           of antibiotics    ----------------------------     -----------------------------         PCT >= 0.50 ng/mL              PCT 0.26 - 0.50 ng/mL                AND       <80% decrease in PCT             Encourage initiation of                                             antibiotics       Encourage continuation           of antibiotics    ----------------------------     -----------------------------        PCT >= 0.50 ng/mL                  PCT > 0.50 ng/mL               AND         increase in PCT                  Strongly encourage                                      initiation of antibiotics    Strongly encourage escalation           of antibiotics                                     -----------------------------                                           PCT <= 0.25 ng/mL  OR                                        > 80% decrease in PCT                                      Discontinue / Do not initiate                                             antibiotics  Performed at [facility], [address] [place], [place] [address]   Lactic acid, plasma     Status: Abnormal   Collection Time: 07/02/23  2:06 PM  Result Value Ref Range   Lactic Acid, Venous 3.5 (HH) 0.5 - 1.9 mmol/L    Comment: CRITICAL VALUE NOTED. VALUE IS CONSISTENT WITH PREVIOUSLY REPORTED/CALLED VALUE MU Performed at [facility], [address] [place], [place] [address]   Urinalysis, w/ Reflex to Culture (Infection Suspected) -Urine, Clean Catch     Status: Abnormal   Collection Time: 07/02/23  2:06 PM  Result Value Ref Range   Specimen Source URINE, CLEAN CATCH    Color, Urine YELLOW (A) YELLOW   APPearance CLEAR (A) CLEAR   Specific Gravity, Urine 1.009 1.005 - 1.030   pH 6.0 5.0 - 8.0   Glucose, UA NEGATIVE NEGATIVE  mg/dL   Hgb urine dipstick LARGE (A) NEGATIVE   Bilirubin Urine NEGATIVE NEGATIVE   Ketones, ur NEGATIVE NEGATIVE mg/dL   Protein, ur NEGATIVE NEGATIVE mg/dL   Nitrite NEGATIVE NEGATIVE   Leukocytes,Ua NEGATIVE NEGATIVE   RBC / HPF 21-50 0 - 5 RBC/hpf   WBC, UA 0-5 0 - 5 WBC/hpf    Comment:        Reflex urine culture not performed if WBC <=10, OR if Squamous epithelial cells >5. If Squamous epithelial cells >5 suggest recollection.    Bacteria, UA NONE SEEN NONE SEEN   Squamous Epithelial / HPF 0-5 0 - 5 /HPF   Mucus PRESENT     Comment: Performed at [facility], [address] [place], [place] [address]  CBG monitoring, ED     Status: None   Collection Time: 07/02/23  4:27 PM  Result Value Ref Range   Glucose-Capillary 75 70 - 99 mg/dL    Comment: Glucose reference range applies only to samples taken after fasting for at least 8 hours.  Lactic acid, plasma     Status: Abnormal   Collection Time: 07/02/23  4:28 PM  Result Value Ref Range   Lactic Acid, Venous 2.3 (HH) 0.5 - 1.9 mmol/L    Comment: CRITICAL VALUE NOTED. VALUE IS CONSISTENT WITH PREVIOUSLY REPORTED/CALLED VALUE MU Performed at [facility], [address] [place], [place] [address]     DG Foot Complete Left  Result Date: 07/02/2023 CLINICAL DATA:  Chronic ulcers to heel and medial foot with worsening surrounding cellulitis. EXAM: LEFT FOOT - COMPLETE 3+ VIEW COMPARISON:  Left foot radiographs 05/11/2014; MRI left ankle 06/23/2023 FINDINGS: There is diffuse decreased bone mineralization. There is plate and screw fixation of the previously seen comminuted navicular fracture on 05/11/2014 radiographs. Increased moderate plantar calcaneal heel spur. Chronic 8 mm well  corticated ossicle dorsal to the talar neck. There appears to be new mild collapse of the talar neck with respect to the remote navicular fracture. There is a horizontal lucency within the distal dorsal aspect of the cuneiforms on  lateral view which may represent an acute to subacute fracture, although no trauma is reported. There is new high-grade plantar and lateral angulation of the great toe distal phalanx with possible fusion at the interphalangeal joint. Mild lucency within the heel soft tissues plantar to the calcaneal heel spur. No definitive cortical erosion is identified, however note is made that marrow edema and likely early cortical erosion were seen within the plantar aspect of the calcaneus on recent 06/23/2023 MRI. No subcutaneous air. IMPRESSION: Compared to 05/11/2014: 1. Mild lucency within the heel soft tissues plantar to the calcaneal heel spur consistent with a ulceration seen on recent MRI. No definitive cortical erosion is identified, however note is made the more sensitive MRI head findings concerning for early osteomyelitis of the plantar calcaneus. No subcutaneous air. 2. There is a horizontal lucency within the distal dorsal aspect of the cuneiforms on lateral view which may represent an acute to subacute fracture, although no trauma is reported. This could represent a chronic finding. 3. There is new high-grade plantar and lateral angulation of the great toe distal phalanx with possible fusion at the interphalangeal joint. 4. Increased moderate plantar calcaneal heel spur. Electronically Signed   By: Neita Garnet M.D.   On: 07/02/2023 15:19   DG Chest Port 1 View  Result Date: 07/02/2023 CLINICAL DATA:  Sepsis.  Lung cancer. EXAM: PORTABLE CHEST 1 VIEW COMPARISON:  08/05/2021 x-ray.  CT 01/24/2023 FINDINGS: Hyperinflation. Chronic lung changes identified. Smaller area of nodularity along the peripheral left upper lobe is similar to the prior CT scan. No pneumothorax, effusion or edema. Normal cardiopericardial silhouette. Calcified aorta. Overlapping cardiac leads. Enlarged pulmonary arteries again seen as on prior CT. Overlapping cardiac leads. IMPRESSION: Hyperinflation with chronic changes. Stable  nodularity along the peripheral left upper lobe compared to recent CT. Electronically Signed   By: Karen Kays M.D.   On: 07/02/2023 12:50    Review of Systems  Constitutional:  Negative for chills and fever.  HENT:  Negative for congestion and sinus pain.   Respiratory:  Negative for cough and shortness of breath.   Cardiovascular:  Negative for chest pain and palpitations.  Gastrointestinal:  Negative for nausea and vomiting.  Genitourinary:  Negative for frequency and urgency.  Musculoskeletal:        Patient relates extensive previous surgery on his left foot.  Skin:        Patient relates chronic ulceration on his left heel.  Recently had some increased drainage as well as redness and swelling  Neurological:        Patient does relate neuropathy associated with his diabetes.  Psychiatric/Behavioral:  Negative for confusion. The patient is not nervous/anxious.    Blood pressure 110/62, pulse 95, temperature 98.3 F (36.8 C), temperature source Oral, resp. rate 13, height 5\' 6"  (1.676 m), weight 71.7 kg, SpO2 96%. Physical Exam Cardiovascular:     Comments: PT pulse not clearly palpable on the left.  1/4 on the right.  DP pulses palpable bilateral. Musculoskeletal:     Comments: Significant decreased range of motion in the left foot due to prior surgery.  Muscle testing deferred.  Skin:    Comments: The skin is warm dry and atrophic bilateral with some hyperpigmentation on the left.  Some  erythema and edema in the left rear foot with full-thickness ulceration beneath the plantar aspect of the heel as well as an area extending out the medial aspect of the heel with some expressible hemorrhagic fluid but no specific purulence.  Some malodor.  Neurological:     Comments: Loss of protective threshold with a monofilament wire.     Assessment/Plan: Assessment: 1.  Full-thickness ulcerations left heel with evidence for osteomyelitis. 2.  Diabetes with neuropathy. 3.  Peripheral  vascular disease.  Plan: Discussed with the patient that we will need to most likely consider debridement of the bone in his left heel to try to prevent higher amputation.  Discussed that we will go ahead and consult with vascular surgery for assessment of his circulation.  Messaged Dr. Wyn Quaker and most likely this will not be able to be performed until early next week.  Discussed with the patient that even with debridement of the heel that he could still be at significant risk for higher amputation due to his diabetes or potential vascular compromise.  Continue to follow during his hospitalization.  Ricci Barker 07/02/2023, 6:01 PM

## 2023-07-03 ENCOUNTER — Encounter
Admission: EM | Disposition: A | Discharge status: Home / Self Care | Payer: Self-pay | Source: Home / Self Care | Attending: Internal Medicine

## 2023-07-03 ENCOUNTER — Encounter: Payer: Self-pay | Admitting: Internal Medicine

## 2023-07-03 DIAGNOSIS — E11621 Type 2 diabetes mellitus with foot ulcer: Secondary | ICD-10-CM

## 2023-07-03 DIAGNOSIS — L02612 Cutaneous abscess of left foot: Secondary | ICD-10-CM | POA: Insufficient documentation

## 2023-07-03 DIAGNOSIS — R7989 Other specified abnormal findings of blood chemistry: Secondary | ICD-10-CM | POA: Diagnosis not present

## 2023-07-03 DIAGNOSIS — L97426 Non-pressure chronic ulcer of left heel and midfoot with bone involvement without evidence of necrosis: Secondary | ICD-10-CM

## 2023-07-03 DIAGNOSIS — L03032 Cellulitis of left toe: Secondary | ICD-10-CM | POA: Diagnosis not present

## 2023-07-03 DIAGNOSIS — I739 Peripheral vascular disease, unspecified: Secondary | ICD-10-CM

## 2023-07-03 DIAGNOSIS — L03116 Cellulitis of left lower limb: Secondary | ICD-10-CM | POA: Diagnosis not present

## 2023-07-03 DIAGNOSIS — N182 Chronic kidney disease, stage 2 (mild): Secondary | ICD-10-CM

## 2023-07-03 DIAGNOSIS — I10 Essential (primary) hypertension: Secondary | ICD-10-CM

## 2023-07-03 HISTORY — PX: LOWER EXTREMITY ANGIOGRAPHY: CATH118251

## 2023-07-03 LAB — BASIC METABOLIC PANEL
Anion gap: 10 (ref 5–15)
BUN: 13 mg/dL (ref 8–23)
CO2: 28 mmol/L (ref 22–32)
Calcium: 8.1 mg/dL — ABNORMAL LOW (ref 8.9–10.3)
Chloride: 101 mmol/L (ref 98–111)
Creatinine, Ser: 1.2 mg/dL (ref 0.61–1.24)
GFR, Estimated: >60 mL/min (ref >60)
Glucose, Bld: 99 mg/dL (ref 70–99)
Potassium: 3.8 mmol/L (ref 3.5–5.1)
Sodium: 139 mmol/L (ref 135–145)

## 2023-07-03 LAB — GLUCOSE, CAPILLARY
Glucose-Capillary: 62 mg/dL — ABNORMAL LOW (ref 70–99)
Glucose-Capillary: 90 mg/dL (ref 70–99)
Glucose-Capillary: 92 mg/dL (ref 70–99)
Glucose-Capillary: 110 mg/dL — ABNORMAL HIGH (ref 70–99)

## 2023-07-03 LAB — CBC
HCT: 30.9 % — ABNORMAL LOW (ref 39.0–52.0)
Hemoglobin: 9.9 g/dL — ABNORMAL LOW (ref 13.0–17.0)
MCH: 31.6 pg (ref 26.0–34.0)
MCHC: 32 g/dL (ref 30.0–36.0)
MCV: 98.7 fL (ref 80.0–100.0)
Platelets: 219 10*3/uL (ref 150–400)
RBC: 3.13 MIL/uL — ABNORMAL LOW (ref 4.22–5.81)
RDW: 15.7 % — ABNORMAL HIGH (ref 11.5–15.5)
WBC: 6.8 10*3/uL (ref 4.0–10.5)
nRBC: 0.7 % — ABNORMAL HIGH (ref 0.0–0.2)

## 2023-07-03 LAB — CULTURE, BLOOD (ROUTINE X 2)
Culture: NO GROWTH
Special Requests: ADEQUATE
Special Requests: ADEQUATE

## 2023-07-03 SURGERY — LOWER EXTREMITY ANGIOGRAPHY
Anesthesia: Moderate Sedation | Laterality: Left

## 2023-07-03 MED ORDER — DIPHENHYDRAMINE HCL 50 MG/ML IJ SOLN: 50.0000 mg | Freq: Once | INTRAMUSCULAR | Status: DC | PRN

## 2023-07-03 MED ORDER — HYDROMORPHONE HCL 1 MG/ML IJ SOLN: 1.0000 mg | Freq: Once | INTRAMUSCULAR | Status: DC | PRN

## 2023-07-03 MED ORDER — MIDAZOLAM HCL 2 MG/2ML IJ SOLN
INTRAMUSCULAR | Status: AC
  Filled 2023-07-03: qty 4

## 2023-07-03 MED ORDER — CEFAZOLIN SODIUM-DEXTROSE 2-4 GM/100ML-% IV SOLN
INTRAVENOUS | Status: AC
  Filled 2023-07-03: qty 100

## 2023-07-03 MED ORDER — DAPAGLIFLOZIN PROPANEDIOL 10 MG PO TABS
10.0000 mg | ORAL_TABLET | Freq: Every day | ORAL | Status: DC
  Administered 2023-07-03 – 2023-07-04 (×2): 10 mg via ORAL
  Filled 2023-07-03 (×2): qty 1

## 2023-07-03 MED ORDER — FENTANYL CITRATE (PF) 100 MCG/2ML IJ SOLN
INTRAMUSCULAR | Status: AC
  Filled 2023-07-03: qty 2

## 2023-07-03 MED ORDER — METOPROLOL SUCCINATE ER 50 MG PO TB24
50.0000 mg | ORAL_TABLET | Freq: Every day | ORAL | Status: DC
  Administered 2023-07-04: 50 mg via ORAL
  Filled 2023-07-03 (×2): qty 1

## 2023-07-03 MED ORDER — MIDAZOLAM HCL 2 MG/2ML IJ SOLN
INTRAMUSCULAR | Status: DC | PRN
  Administered 2023-07-03: 2 mg via INTRAVENOUS

## 2023-07-03 MED ORDER — FENTANYL CITRATE PF 50 MCG/ML IJ SOSY: 12.5000 ug | PREFILLED_SYRINGE | Freq: Once | INTRAMUSCULAR | Status: DC | PRN

## 2023-07-03 MED ORDER — FAMOTIDINE 20 MG PO TABS: 40.0000 mg | ORAL_TABLET | Freq: Once | ORAL | Status: DC | PRN

## 2023-07-03 MED ORDER — SODIUM CHLORIDE 0.9 % IV SOLN: INTRAVENOUS | Status: DC

## 2023-07-03 MED ORDER — DEXTROSE 50 % IV SOLN
1.0000 | Freq: Once | INTRAVENOUS | Status: AC
  Administered 2023-07-03: 50 mL via INTRAVENOUS
  Filled 2023-07-03: qty 50

## 2023-07-03 MED ORDER — ONDANSETRON HCL 4 MG/2ML IJ SOLN: 4.0000 mg | Freq: Four times a day (QID) | INTRAMUSCULAR | Status: DC | PRN

## 2023-07-03 MED ORDER — MIDAZOLAM HCL 2 MG/ML PO SYRP: 8.0000 mg | ORAL_SOLUTION | Freq: Once | ORAL | Status: DC | PRN

## 2023-07-03 MED ORDER — HYDROCHLOROTHIAZIDE 12.5 MG PO TABS
12.5000 mg | ORAL_TABLET | Freq: Every day | ORAL | Status: DC
  Administered 2023-07-03 – 2023-07-04 (×2): 12.5 mg via ORAL
  Filled 2023-07-03 (×2): qty 1

## 2023-07-03 MED ORDER — METHYLPREDNISOLONE SODIUM SUCC 125 MG IJ SOLR: 125.0000 mg | Freq: Once | INTRAMUSCULAR | Status: DC | PRN

## 2023-07-03 MED ORDER — IODIXANOL 320 MG/ML IV SOLN
INTRAVENOUS | Status: DC | PRN
  Administered 2023-07-03: 55 mL

## 2023-07-03 MED ORDER — FENTANYL CITRATE (PF) 100 MCG/2ML IJ SOLN
INTRAMUSCULAR | Status: DC | PRN
  Administered 2023-07-03: 50 ug via INTRAVENOUS

## 2023-07-03 MED ORDER — LIDOCAINE-EPINEPHRINE (PF) 1 %-1:200000 IJ SOLN
INTRAMUSCULAR | Status: DC | PRN
  Administered 2023-07-03: 10 mL

## 2023-07-03 MED ORDER — CEFAZOLIN SODIUM-DEXTROSE 2-4 GM/100ML-% IV SOLN
2.0000 g | INTRAVENOUS | Status: AC
  Administered 2023-07-03: 2 g via INTRAVENOUS

## 2023-07-03 MED ORDER — HEPARIN (PORCINE) IN NACL 1000-0.9 UT/500ML-% IV SOLN
INTRAVENOUS | Status: DC | PRN
  Administered 2023-07-03: 1000 mL

## 2023-07-03 MED ORDER — HEPARIN SODIUM (PORCINE) 1000 UNIT/ML IJ SOLN
INTRAMUSCULAR | Status: AC
  Filled 2023-07-03: qty 10

## 2023-07-03 MED ORDER — VANCOMYCIN HCL 1250 MG/250ML IV SOLN
1250.0000 mg | INTRAVENOUS | Status: DC
  Administered 2023-07-03: 1250 mg via INTRAVENOUS
  Filled 2023-07-03 (×2): qty 250

## 2023-07-03 MED ORDER — CLOPIDOGREL BISULFATE 75 MG PO TABS
75.0000 mg | ORAL_TABLET | Freq: Every day | ORAL | Status: DC
  Administered 2023-07-03 – 2023-07-04 (×2): 75 mg via ORAL
  Filled 2023-07-03 (×2): qty 1

## 2023-07-03 MED ORDER — HEPARIN SODIUM (PORCINE) 1000 UNIT/ML IJ SOLN
INTRAMUSCULAR | Status: DC | PRN
  Administered 2023-07-03: 5000 [IU] via INTRAVENOUS

## 2023-07-03 SURGICAL SUPPLY — 17 items
BALLN LUTONIX 6X150X130 (BALLOONS) ×1
BALLN LUTONIX DCB 6X100X130 (BALLOONS) ×1
BALLOON LUTONIX 6X150X130 (BALLOONS) IMPLANT
BALLOON LUTONIX DCB 6X100X130 (BALLOONS) IMPLANT
CATH ANGIO 5F PIGTAIL 65CM (CATHETERS) IMPLANT
CATH BEACON 5 .038 100 VERT TP (CATHETERS) IMPLANT
COVER EZ STRL 42X30 (DRAPES) IMPLANT
COVER PROBE ULTRASOUND 5X96 (MISCELLANEOUS) IMPLANT
DEVICE STARCLOSE SE CLOSURE (Vascular Products) IMPLANT
GLIDEWIRE ADV .035X260CM (WIRE) IMPLANT
KIT ENCORE 26 ADVANTAGE (KITS) IMPLANT
PACK ANGIOGRAPHY (CUSTOM PROCEDURE TRAY) ×1 IMPLANT
SHEATH ANL2 6FRX45 HC (SHEATH) IMPLANT
SHEATH BRITE TIP 5FRX11 (SHEATH) IMPLANT
STENT LIFESTENT 5F 7X100X135 (Permanent Stent) IMPLANT
TUBING CONTRAST HIGH PRESS 72 (TUBING) IMPLANT
WIRE GUIDERIGHT .035X150 (WIRE) IMPLANT

## 2023-07-03 NOTE — Assessment & Plan Note (Signed)
2D echo on 04/21/2023 showed EF> 55%.  Patient has leg edema, BNP is minimally elevated 203, no shortness of breath.  No oxygen desaturation, does not seem to have CHF exacerbation. -Watch volume status closely-patient did receive some IV fluid

## 2023-07-03 NOTE — Consult Note (Signed)
Hospital Consult    Reason for Consult:  Left Heel Ulceration  Requesting Physician:  Dr Linus Galas MD MRN #:  [id number]  History of Present Illness: This is a 67 y.o. male with a chronic history of ulceration on his left heel. 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 Patient was  scheduled for outpatient vascular consult but presented to the hospital today with increasing redness and swelling and drainage.   On exam patient rests comfortably in bed. Endorses pain and increased swelling with redness over the past week. Also endorses some new drainage from his heel. Painful to walk. No complaints overnight and vitals all remain stable.   Past Medical History:  Diagnosis Date   Anemia    Anxiety    Arthritis    Asthma    Cancer (HCC)    Basal Cell Skin Cancer   Chronic back pain    COPD (chronic obstructive pulmonary disease) (HCC)    Depression    Diabetes mellitus (HCC)    Dyspnea    GERD (gastroesophageal reflux disease)    Gout    Gout    Headache    History of blood clots    Left Leg--July 2018   History of kidney stones    Hyperlipidemia    Hyperlipidemia    Hypertension    Kidney stones    Neuropathy    On home oxygen therapy    2 L / M   Pneumonia 06/2017   Sleep apnea    Ulcer of foot (HCC)    Right    Past Surgical History:  Procedure Laterality Date   APPENDECTOMY     DG FEET 2 VIEWS BILAT     LIPOMA EXCISION Right 08/15/2017   Procedure: EXCISION TUMOR(CYST) FOOT;  Surgeon: Recardo Evangelist, DPM;  Location: [facility] ORS;  Service: Podiatry;  Laterality: Right;   OTHER SURGICAL HISTORY Bilateral Foot surgery    Allergies  Allergen Reactions   Bee Venom Anaphylaxis    Prior to Admission medications   Medication Sig Start Date End Date Taking? Authorizing Provider  acetaminophen (TYLENOL) 325 MG tablet Take 650 mg by mouth every 6 (six) hours as needed.    Yes [provider]  albuterol (PROVENTIL) (2.5 MG/3ML) 0.083% nebulizer solution Take 2.5 mg by nebulization every 4 (four) hours as needed. 05/29/23  Yes [provider]  albuterol (VENTOLIN HFA) 108 (90 Base) MCG/ACT inhaler Inhale 2 puffs into the lungs every 4 (four) hours as needed for wheezing or shortness of breath.   Yes [provider]  allopurinol (ZYLOPRIM) 300 MG tablet Take 600 mg by mouth daily. 02/24/20  Yes [provider]  aspirin EC 81 MG tablet Take 1 tablet by mouth daily.   Yes [provider]  azelastine (ASTELIN) 0.1 % nasal spray Place 2 sprays into both nostrils 2 (two) times daily. 05/27/23  Yes [provider]  buprenorphine-naloxone (SUBOXONE) 8-2 mg SUBL SL tablet Place 1 tablet under the tongue in the morning, at noon, and at bedtime.   Yes [provider]  busPIRone (BUSPAR) 7.5 MG tablet Take 7.5 mg by mouth 2 (two) times daily. 04/30/23 04/29/24 Yes [provider]  clopidogrel (PLAVIX) 75 MG tablet Take 1 tablet (75 mg total) by mouth daily. 02/02/21  Yes Willeen Niece, MD  DULoxetine (CYMBALTA) 30 MG capsule Take 1 capsule by mouth daily. 01/17/16  Yes [provider]  ezetimibe (ZETIA) 10 MG tablet Take 10 mg by mouth daily. 02/24/20  Yes [provider]  FARXIGA 10 MG TABS tablet Take 10 mg by mouth daily. 09/14/20  Yes [provider]  Fluticasone-Umeclidin-Vilant (TRELEGY ELLIPTA) 100-62.5-25 MCG/ACT AEPB Inhale into the lungs. 09/12/21  Yes [provider]  gabapentin (NEURONTIN) 800 MG tablet Take 800 mg by mouth 4 (four) times daily. 07/14/21  Yes [provider]  hydrochlorothiazide (HYDRODIURIL) 12.5 MG tablet Take 12.5 mg by mouth daily. 05/29/23  Yes [provider]  metFORMIN (GLUCOPHAGE) 1000 MG tablet Take 1,000 mg by mouth 2 (two) times daily with a meal.   Yes [provider]  metoprolol succinate (TOPROL-XL) 50 MG 24 hr tablet  Take 50 mg by mouth daily. 02/24/20  Yes [provider]  pantoprazole (PROTONIX) 40 MG tablet Take 40 mg by mouth daily. 06/20/21  Yes [provider]  roflumilast (DALIRESP) 500 MCG TABS tablet Take 500 mcg by mouth daily. 08/15/21  Yes [provider]  simvastatin (ZOCOR) 40 MG tablet Take 1 tablet by mouth daily. 01/17/16  Yes [provider]  sulfamethoxazole-trimethoprim (BACTRIM DS) 800-160 MG tablet Take 1 tablet by mouth 2 (two) times daily.   Yes [provider]  cholecalciferol (VITAMIN D) 1000 units tablet Take 1,000 Units by mouth daily. Patient not taking: Reported on 10/25/2022    [provider]  clonazePAM (KLONOPIN) 1 MG tablet Take 1 mg by mouth 2 (two) times daily as needed for anxiety. Patient not taking: Reported on 10/25/2022    [provider]  cyanocobalamin (VITAMIN B12) 1000 MCG/ML injection Inject 1 mL (1,000 mcg total) into the muscle every 30 (thirty) days. Patient not taking: Reported on 07/02/2023 07/12/22   Earna Coder, MD  doxycycline (VIBRA-TABS) 100 MG tablet Take 1 tablet (100 mg total) by mouth 2 (two) times daily. Patient not taking: Reported on 10/25/2022 06/10/22   Creig Hines, MD  OXYGEN Inhale 2 L into the lungs.    [provider]  Syringe/Needle, Disp, (SYRINGE 3CC/25GX1") 25G X 1" 3 ML MISC 1 Syringe by Does not apply route every 30 (thirty) days. 07/12/22   Earna Coder, MD    Social History   Socioeconomic History   Marital status: Widowed    Spouse name: Not on file   Number of children: Not on file   Years of education: Not on file   Highest education level: Not on file  Occupational History   Not on file  Tobacco Use   Smoking status: Former    Current packs/day: 0.50    Average packs/day: 0.5 packs/day for 6.6 years (3.3 ttl pk-yrs)    Types: Cigarettes    Start date: 2018   Smokeless tobacco: Never  Vaping Use   Vaping status: Never Used  Substance  and Sexual Activity   Alcohol use: Yes    Alcohol/week: 1.0 standard drink of alcohol    Types: 1 Cans of beer per week    Comment: occ   Drug use: No   Sexual activity: Not Currently  Other Topics Concern   Not on file  Social History Narrative   Not on file   Social Determinants of Health   Financial Resource Strain: Low Risk  (04/19/2023)   Received from [facility]   Overall Financial Resource Strain (CARDIA)    Difficulty of Paying Living Expenses: Not hard at all  Food Insecurity: No Food Insecurity (07/02/2023)  Hunger Vital Sign    Worried About Running Out of Food in the Last Year: Never true    Ran Out of Food in the Last Year: Never true  Transportation Needs: No Transportation Needs (07/02/2023)   PRAPARE - Administrator, Civil Service (Medical): No    Lack of Transportation (Non-Medical): No  Physical Activity: Not on file  Stress: Not on file  Social Connections: Not on file  Intimate Partner Violence: Not At Risk (07/02/2023)   Humiliation, Afraid, Rape, and Kick questionnaire    Fear of Current or Ex-Partner: No    Emotionally Abused: No    Physically Abused: No    Sexually Abused: No     Family History  Problem Relation Age of Onset   Hypertension Mother     ROS: Otherwise negative unless mentioned in HPI  Physical Examination  Vitals:   07/03/23 0036 07/03/23 0436  BP: 108/73 107/64  Pulse: 88 77  Resp:    Temp: 97.9 F (36.6 C) 98.8 F (37.1 C)  SpO2: 98% 94%   Body mass index is 23.31 kg/m.  General:  WDWN in NAD Gait: Not observed HENT: WNL, normocephalic Pulmonary: normal non-labored breathing, without Rales, rhonchi,  wheezing Cardiac: regular, without  Murmurs, rubs or gallops; without carotid bruits Abdomen: positive bowel sounds, soft, NT/ND, no masses Skin: without rashes Vascular Exam/Pulses: Unable to palpate bilateral lower extremity pulses. Warm to touch, left heel ulcer penetrating through  with 2 openings. See Media Files.  Extremities: with ischemic changes, without Gangrene , with cellulitis; with open wounds;  Musculoskeletal: no muscle wasting or atrophy  Neurologic: A&O X 3;  No focal weakness or paresthesias are detected; speech is fluent/normal Psychiatric:  The pt has Normal affect. Lymph:  Unremarkable  CBC    Component Value Date/Time   WBC 6.8 07/03/2023 0331   RBC 3.13 (L) 07/03/2023 0331   HGB 9.9 (L) 07/03/2023 0331   HGB 13.9 05/17/2014 1424   HCT 30.9 (L) 07/03/2023 0331   HCT 44.6 03/31/2014 1815   PLT 219 07/03/2023 0331   PLT 261 03/31/2014 1815   MCV 98.7 07/03/2023 0331   MCV 96 03/31/2014 1815   MCH 31.6 07/03/2023 0331   MCHC 32.0 07/03/2023 0331   RDW 15.7 (H) 07/03/2023 0331   RDW 13.7 03/31/2014 1815   LYMPHSABS 2.7 10/25/2022 0923   LYMPHSABS 0.6 (L) 09/25/2013 0356   MONOABS 0.5 10/25/2022 0923   MONOABS 0.1 (L) 09/25/2013 0356   EOSABS 0.6 (H) 10/25/2022 0923   EOSABS 0.0 09/25/2013 0356   BASOSABS 0.1 10/25/2022 0923   BASOSABS 0.0 09/25/2013 0356    BMET    Component Value Date/Time   NA 139 07/03/2023 0331   NA 136 03/31/2014 1815   K 3.8 07/03/2023 0331   K 3.5 05/17/2014 1424   CL 101 07/03/2023 0331   CL 100 03/31/2014 1815   CO2 28 07/03/2023 0331   CO2 30 03/31/2014 1815   GLUCOSE 99 07/03/2023 0331   GLUCOSE 91 05/17/2014 1424   BUN 13 07/03/2023 0331   BUN 13 03/31/2014 1815   CREATININE 1.20 07/03/2023 0331   CREATININE 1.05 03/31/2014 1815   CALCIUM 8.1 (L) 07/03/2023 0331   CALCIUM 9.3 03/31/2014 1815   GFRNONAA >60 07/03/2023 0331   GFRNONAA >60 03/31/2014 1815   GFRAA >60 03/28/2020 0427   GFRAA >60 03/31/2014 1815    COAGS: Lab Results  Component Value Date   INR 1.1 07/02/2023   INR  1.1 11/12/2020   INR 1.1 03/25/2020     Non-Invasive Vascular Imaging:   EXAM:06/30/23 MRI OF THE LEFT ANKLE WITHOUT CONTRAST   TECHNIQUE: Multiplanar, multisequence MR imaging of the ankle was performed.  No intravenous contrast was administered.   COMPARISON:  Left foot x-ray 05/11/2014   FINDINGS: Bones/Joint/Cartilage   Subcortical bone marrow edema within the plantar aspect of the posterior calcaneus. Possible shallow cortical erosion near the plantar fascia enthesis site (series 5, image 13). No additional sites of erosion or marrow replacement.   No acute fracture. 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 Chronic fragmentation of the navicular. Large area of bone infarction within the distal tibia.   Ligaments   Lateral ankle ligaments are likely chronically torn with thickened joint capsule. No evidence of an acute ligamentous injury.   Muscles and Tendons   Denervation changes of the lower leg and foot musculature. Small volume tenosynovial fluid within the tibialis posterior tendon sheath.   Soft tissues   Plantar soft tissue ulceration underlying the posterior calcaneus. Adjacent subcutaneous edema. No organized fluid collections.   IMPRESSION: 1. Plantar soft tissue ulceration underlying the posterior calcaneus with subcortical bone marrow edema within the plantar aspect of the posterior calcaneus with possible shallow cortical erosion near the plantar fascia enthesis site. Findings are concerning for early osteomyelitis. 2. 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  Statin:  Yes.   Beta Blocker:  No. Aspirin:  Yes.   ACEI:  No. ARB:  No. CCB use:  No Other antiplatelets/anticoagulants:  No.    ASSESSMENT/PLAN: This is a 67 y.o. male who presents to [facility] Emergency room with pain, redness, swelling and drainage to his left heel. He was scheduled outpatient with vascular surgery.  PLAN: Vascular surgery plans on taking the patient to the vascular lab today for a left lower extremity angiogram with possible intervention. I discussed in detail with the  patient the procedure, benefits, risks and complications. He verbalizes his understanding and wishes to proceed. I answered all the patients questions. Patient remains NPO for the procedure.     -I discussed the plan with Dr Festus Barren MD and he agrees with the plan.    Marcie Bal [facility] 07/03/2023 7:16 AM

## 2023-07-03 NOTE — Assessment & Plan Note (Signed)
Currently not using CPAP 

## 2023-07-03 NOTE — Assessment & Plan Note (Signed)
S/p lower extremity angiography and angioplasty of right SFA. -Started on DAPT with aspirin and Plavix -Continue with statin

## 2023-07-03 NOTE — Progress Notes (Signed)
Day of Surgery   Subjective/Chief Complaint: Patient seen.  States he is feeling much better today.  No specific complaints.   Objective: Vital signs in last 24 hours: Temp:  [97.8 F (36.6 C)-98.8 F (37.1 C)] 98.1 F (36.7 C) (07/25 0934) Pulse Rate:  [77-90] 87 (07/25 1130) Resp:  [13-20] 17 (07/25 1130) BP: (107-150)/(64-93) 144/78 (07/25 1130) SpO2:  [86 %-100 %] 92 % (07/25 1130) Weight:  [67.5 kg] 67.5 kg (07/25 0121)    Intake/Output from previous day: 07/24 0701 - 07/25 0700 In: -  Out: 300 [Urine:300] Intake/Output this shift: Total I/O In: -  Out: 300 [Urine:300]  Dressing on the left foot intact and left in place.  Lab Results:  Recent Labs    07/02/23 1206 07/03/23 0331  WBC 9.3 6.8  HGB 11.3* 9.9*  HCT 35.8* 30.9*  PLT 234 219   BMET Recent Labs    07/02/23 1206 07/03/23 0331  NA 134* 139  K 3.7 3.8  CL 94* 101  CO2 25 28  GLUCOSE 80 99  BUN 13 13  CREATININE 1.63* 1.20  CALCIUM 8.6* 8.1*   PT/INR Recent Labs    07/02/23 1206  LABPROT 14.0  INR 1.1   ABG No results for input(s): "PHART", "HCO3" in the last 72 hours.  Invalid input(s): "PCO2", "PO2"  Studies/Results: PERIPHERAL VASCULAR CATHETERIZATION  Result Date: 07/03/2023 See surgical note for result.  US Venous Img Lower Bilateral (DVT)  Result Date: 07/02/2023 CLINICAL DATA:  Lower extremity swelling EXAM: BILATERAL LOWER EXTREMITY VENOUS DOPPLER ULTRASOUND TECHNIQUE: Gray-scale sonography with graded compression, as well as color Doppler and duplex ultrasound were performed to evaluate the lower extremity deep venous systems from the level of the common femoral vein and including the common femoral, femoral, profunda femoral, popliteal and calf veins including the posterior tibial, peroneal and gastrocnemius veins when visible. The superficial great saphenous vein was also interrogated. Spectral Doppler was utilized to evaluate flow at rest and with distal augmentation  maneuvers in the common femoral, femoral and popliteal veins. COMPARISON:  None Available. FINDINGS: RIGHT LOWER EXTREMITY Common Femoral Vein: No evidence of thrombus. Normal compressibility, respiratory phasicity and response to augmentation. Saphenofemoral Junction: No evidence of thrombus. Normal compressibility and flow on color Doppler imaging. Profunda Femoral Vein: No evidence of thrombus. Normal compressibility and flow on color Doppler imaging. Femoral Vein: No evidence of thrombus. Normal compressibility, respiratory phasicity and response to augmentation. Popliteal Vein: No evidence of thrombus. Normal compressibility, respiratory phasicity and response to augmentation. Calf Veins: No evidence of thrombus. Normal compressibility and flow on color Doppler imaging. Superficial Great Saphenous Vein: No evidence of thrombus. Normal compressibility. Venous Reflux:  None. Other Findings:  None. LEFT LOWER EXTREMITY Common Femoral Vein: No evidence of thrombus. Normal compressibility, respiratory phasicity and response to augmentation. Saphenofemoral Junction: No evidence of thrombus. Normal compressibility and flow on color Doppler imaging. Profunda Femoral Vein: No evidence of thrombus. Normal compressibility and flow on color Doppler imaging. Femoral Vein: No evidence of thrombus. Normal compressibility, respiratory phasicity and response to augmentation. Popliteal Vein: No evidence of thrombus. Normal compressibility, respiratory phasicity and response to augmentation. Calf Veins: No evidence of thrombus. Normal compressibility and flow on color Doppler imaging. Superficial Great Saphenous Vein: No evidence of thrombus. Normal compressibility. Venous Reflux:  None. Other Findings:  None. IMPRESSION: No evidence of deep venous thrombosis in either lower extremity. Electronically Signed   By: Alcide Clever M.D.   On: 07/02/2023 23:50   DG Foot Complete  Left  Result Date: 07/02/2023 CLINICAL DATA:  Chronic  ulcers to heel and medial foot with worsening surrounding cellulitis. EXAM: LEFT FOOT - COMPLETE 3+ VIEW COMPARISON:  Left foot radiographs 05/11/2014; MRI left ankle 06/23/2023 FINDINGS: There is diffuse decreased bone mineralization. There is plate and screw fixation of the previously seen comminuted navicular fracture on 05/11/2014 radiographs. Increased moderate plantar calcaneal heel spur. Chronic 8 mm well corticated ossicle dorsal to the talar neck. There appears to be new mild collapse of the talar neck with respect to the remote navicular fracture. There is a horizontal lucency within the distal dorsal aspect of the cuneiforms on lateral view which may represent an acute to subacute fracture, although no trauma is reported. There is new high-grade plantar and lateral angulation of the great toe distal phalanx with possible fusion at the interphalangeal joint. Mild lucency within the heel soft tissues plantar to the calcaneal heel spur. No definitive cortical erosion is identified, however note is made that marrow edema and likely early cortical erosion were seen within the plantar aspect of the calcaneus on recent 06/23/2023 MRI. No subcutaneous air. IMPRESSION: Compared to 05/11/2014: 1. Mild lucency within the heel soft tissues plantar to the calcaneal heel spur consistent with a ulceration seen on recent MRI. No definitive cortical erosion is identified, however note is made the more sensitive MRI head findings concerning for early osteomyelitis of the plantar calcaneus. No subcutaneous air. 2. There is a horizontal lucency within the distal dorsal aspect of the cuneiforms on lateral view which may represent an acute to subacute fracture, although no trauma is reported. This could represent a chronic finding. 3. There is new high-grade plantar and lateral angulation of the great toe distal phalanx with possible fusion at the interphalangeal joint. 4. Increased moderate plantar calcaneal heel spur.  Electronically Signed   By: Neita Garnet M.D.   On: 07/02/2023 15:19   DG Chest Port 1 View  Result Date: 07/02/2023 CLINICAL DATA:  Sepsis.  Lung cancer. EXAM: PORTABLE CHEST 1 VIEW COMPARISON:  08/05/2021 x-ray.  CT 01/24/2023 FINDINGS: Hyperinflation. Chronic lung changes identified. Smaller area of nodularity along the peripheral left upper lobe is similar to the prior CT scan. No pneumothorax, effusion or edema. Normal cardiopericardial silhouette. Calcified aorta. Overlapping cardiac leads. Enlarged pulmonary arteries again seen as on prior CT. Overlapping cardiac leads. IMPRESSION: Hyperinflation with chronic changes. Stable nodularity along the peripheral left upper lobe compared to recent CT. Electronically Signed   By: Karen Kays M.D.   On: 07/02/2023 12:50    Anti-infectives: Anti-infectives (From admission, onward)    Start     Dose/Rate Route Frequency Ordered Stop   07/03/23 1600  vancomycin (VANCOREADY) IVPB 1250 mg/250 mL        1,250 mg 166.7 mL/hr over 90 Minutes Intravenous Every 24 hours 07/03/23 0835     07/03/23 0813  ceFAZolin (ANCEF) IVPB 2g/100 mL premix        2 g 200 mL/hr over 30 Minutes Intravenous 30 min pre-op 07/03/23 0813 07/03/23 1040   07/02/23 1500  vancomycin (VANCOREADY) IVPB 1500 mg/300 mL        1,500 mg 150 mL/hr over 120 Minutes Intravenous  Once 07/02/23 1442 07/02/23 1807   07/02/23 1215  cefTRIAXone (ROCEPHIN) 2 g in sodium chloride 0.9 % 100 mL IVPB        2 g 200 mL/hr over 30 Minutes Intravenous Every 24 hours 07/02/23 1200 07/09/23 1159       Assessment/Plan: s/p  Procedure(s): Lower Extremity Angiography (Left) Assessment: Osteomyelitis left heel.  Plan: Discussed with the patient my recommendation for debridement of the left heel which I had initially planned for tomorrow morning.  Patient states that he is feeling better and has a follow-up with Dr. Ether Griffins on Tuesday and is fairly adamant about not wanting to have any surgery prior  to following up with Dr. Ether Griffins next week.  Discussed with the patient that the longer he waits he does run significantly higher risk of limb loss with higher amputation.  Patient is fully aware of potential consequences of delaying surgery and still elects to go home either tonight or tomorrow morning early.  May continue with home health care at home until his follow-up.  Okay to discharge from podiatry standpoint when medically stable.  LOS: 1 day    Christian Fields 07/03/2023

## 2023-07-03 NOTE — Op Note (Signed)
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  Ultrasound guidance for vascular access right femoral artery             2.  Catheter placement into left common femoral artery from right femoral approach             3.  Aortogram and selective left lower extremity angiogram             4.  Percutaneous transluminal angioplasty of left SFA with 6 mm diameter by 15 cm length Lutonix drug-coated angioplasty balloon             5.  Stent placement to the left SFA with 7 mm diameter by 10 cm length life stent  6.  StarClose closure device right femoral artery  EBL: 5 cc  Contrast: 55 cc  Fluoro Time: 3.4 minutes  Moderate Conscious Sedation Time: approximately 25 minutes using 2 mg of Versed and 50 mcg of Fentanyl              Indications:  Patient is a 67 y.o.male with ulceration and infection of the left foot.  The patient is brought in for angiography for further evaluation and potential treatment.  Due to the limb threatening nature of the situation, angiogram was performed for attempted limb salvage. The patient is aware that if the procedure fails, amputation would be expected.  The patient also understands that even with successful revascularization, amputation may still be required due to the severity of the situation.  Risks and benefits are discussed and informed consent is obtained.   Procedure:  The patient was identified and appropriate procedural time out was performed.  The patient was then placed supine on the table and prepped and draped in the usual sterile fashion. Moderate conscious sedation was administered during a face to face encounter with the patient throughout the  procedure with my supervision of the RN administering medicines and monitoring the patient's vital signs, pulse oximetry, telemetry and mental status throughout from the start of the procedure until the patient was taken to the recovery room. Ultrasound was used to evaluate the right common femoral artery.  It was patent .  A digital ultrasound image was acquired.  A Seldinger needle was used to access the right common femoral artery under direct ultrasound guidance and a permanent image was performed.  A 0.035 J wire was advanced without resistance and a 5Fr sheath was placed.  Pigtail catheter was placed into the aorta and an AP aortogram was performed. This demonstrated normal renal arteries.  The aorta was at least moderately aneurysmal in the distal portion.  The left common and proximal external iliac arteries were also aneurysmal.  The right iliac arteries were somewhat ectatic but not aneurysmal frankly.  There were no obvious iliac stenoses. I then crossed the aortic bifurcation and advanced to the left femoral head. Selective left lower extremity angiogram was then performed. This demonstrated calcified but not stenotic left common femoral artery and profunda femoris artery.  The SFA was patent proximally, but in the midsegment was about a 70 to 80% stenosis that was significantly calcific.  The popliteal artery was fairly normal.  There is a typical  tibial trifurcation with three-vessel runoff distally with both the anterior tibial and posterior tibial arteries feeding the foot. It was felt that it was in the patient's best interest to proceed with intervention after these images to avoid a second procedure and a larger amount of contrast and fluoroscopy based off of the findings from the initial angiogram. The patient was systemically heparinized and a 6 [place] Ansell sheath was then placed over the [employer] wire. I then used a Kumpe catheter and the advantage wire to cross the stenosis without  difficulty and confirm intraluminal flow in the popliteal artery.  The advantage wire was then replaced.  I then treated the SFA with a 6 mm diameter by 15 cm length Lutonix drug-coated angioplasty balloon inflated to 12 atm for 1 minute.  Completion imaging showed greater than 50% residual stenosis in the area treated and so a 7 mm diameter by 10 cm length life stent was selected and deployed in the left mid SFA and postdilated with a 6 mm diameter Lutonix drug-coated angioplasty balloon.  Completion imaging showed about a 10% residual stenosis in the left SFA after stent placement. I elected to terminate the procedure.  A CT scan of the abdomen pelvis can be performed to evaluate the aortoiliac aneurysmal disease in the coming days in more detail.  The sheath was removed and StarClose closure device was deployed in the right femoral artery with excellent hemostatic result. The patient was taken to the recovery room in stable condition having tolerated the procedure well.  Findings:               Aortogram:  This demonstrated normal renal arteries.  The aorta was at least moderately aneurysmal in the distal portion.  The left common and proximal external iliac arteries were also aneurysmal.  The right iliac arteries were somewhat ectatic but not aneurysmal frankly.  There were no obvious iliac stenoses.              Left Lower Extremity:  This demonstrated calcified but not stenotic left common femoral artery and profunda femoris artery.  The SFA was patent proximally, but in the midsegment was about a 70 to 80% stenosis that was significantly calcific.  The popliteal artery was fairly normal.  There is a typical tibial trifurcation with three-vessel runoff distally with both the anterior tibial and posterior tibial arteries feeding the foot.   Disposition: Patient was taken to the recovery room in stable condition having tolerated the procedure well.  Complications: None  Festus Barren 07/03/2023 10:55  AM   This note was created with Dragon Medical transcription system. Any errors in dictation are purely unintentional.

## 2023-07-03 NOTE — Assessment & Plan Note (Signed)
Recent A1c 4.9, well-controlled.  Patient taking metformin and Farxiga -SSI

## 2023-07-03 NOTE — Consult Note (Addendum)
WOC Nurse Consult Note: this patient is followed by podiatry at [facility] for R great toe and L heel diabetic ulcers; Also has home health nurse coming out and packing L heel with silver hydrofiber and applying Medihoney to R great toe  Reason for Consult: L heel ulcer  Wound type: 1.  Full thickness diabetic ulcer L heel  2.  Full thickness diabetic ulcer R great toe 3. Full thickness diabetic ulcer L 5th digit  Pressure Injury POA: NA, not pressure  Measurement: 1.  L heel full thickness ulcer 2 cm x 2 cm x 0.5 cm with a 5 cm tunnel at approximately 11 o'clock that extends and exits to L medial midfoot; wound bed that is visible is 75% pink moist 25% yellow  2.  R great toe full thickness  ulcer 0.3 cm x 0.5 cm x 0.1 cm 80% dry dark eschar 20% pink moist  3.  L 5th digit full thickness ulcer 0.3 cm x 0.3 cm 100% dry eschar   Drainage (amount, consistency, odor) minimal tan exudate from L heel wound, R great toe scant serosanguinous, L 5th digit dry  Periwound: erythema and edema surrounding L heel ulcer, R great toe and L 5th digit with heavy callus formation surrounding  Dressing procedure/placement/frequency:  Clean L heel ulcer with NS, cut a strip of silver hydrofiber [employer] [place] [phone]) and using a Q tip applicator insert silver beginning at heel wound and through tunnel to L midfoot wound, fill remaining wound bed at left heel with silver.  This should be performed daily. Cover with dry gauze and wrap foot with Kerlix roll gauze and secure with tape.  Clean L 5th digit and R great toe ulcers with NS, paint with Betadine twice daily. May leave open to air or cover with gauze after Betadine is dry.    Patient had MRI 06/23/2023 concerning for early osteomyelitis.  Is being followed by vascular surgery and any wound care orders they provide will supercede WOC wound care orders.   POC discussed with patient and bedside nurse.  WOC team will not follow at this time. Re-consult if further needs  arise.   Thank you,    Priscella Mann MSN, RN-BC, [employer] [phone]

## 2023-07-03 NOTE — Assessment & Plan Note (Signed)
-   Continue home meds °

## 2023-07-03 NOTE — H&P (View-Only) (Signed)
Hospital Consult    Reason for Consult:  Left Heel Ulceration  Requesting Physician:  Dr Linus Galas MD MRN #:  [id number]  History of Present Illness: This is a 67 y.o. male with a chronic history of ulceration on his left heel. 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 Patient was  scheduled for outpatient vascular consult but presented to the hospital today with increasing redness and swelling and drainage.   On exam patient rests comfortably in bed. Endorses pain and increased swelling with redness over the past week. Also endorses some new drainage from his heel. Painful to walk. No complaints overnight and vitals all remain stable.   Past Medical History:  Diagnosis Date   Anemia    Anxiety    Arthritis    Asthma    Cancer (HCC)    Basal Cell Skin Cancer   Chronic back pain    COPD (chronic obstructive pulmonary disease) (HCC)    Depression    Diabetes mellitus (HCC)    Dyspnea    GERD (gastroesophageal reflux disease)    Gout    Gout    Headache    History of blood clots    Left Leg--July 2018   History of kidney stones    Hyperlipidemia    Hyperlipidemia    Hypertension    Kidney stones    Neuropathy    On home oxygen therapy    2 L / M   Pneumonia 06/2017   Sleep apnea    Ulcer of foot (HCC)    Right    Past Surgical History:  Procedure Laterality Date   APPENDECTOMY     DG FEET 2 VIEWS BILAT     LIPOMA EXCISION Right 08/15/2017   Procedure: EXCISION TUMOR(CYST) FOOT;  Surgeon: Recardo Evangelist, DPM;  Location: [facility] ORS;  Service: Podiatry;  Laterality: Right;   OTHER SURGICAL HISTORY Bilateral Foot surgery    Allergies  Allergen Reactions   Bee Venom Anaphylaxis    Prior to Admission medications   Medication Sig Start Date End Date Taking? Authorizing Provider  acetaminophen (TYLENOL) 325 MG tablet Take 650 mg by mouth every 6 (six) hours as needed.    Yes [provider]  albuterol (PROVENTIL) (2.5 MG/3ML) 0.083% nebulizer solution Take 2.5 mg by nebulization every 4 (four) hours as needed. 05/29/23  Yes [provider]  albuterol (VENTOLIN HFA) 108 (90 Base) MCG/ACT inhaler Inhale 2 puffs into the lungs every 4 (four) hours as needed for wheezing or shortness of breath.   Yes [provider]  allopurinol (ZYLOPRIM) 300 MG tablet Take 600 mg by mouth daily. 02/24/20  Yes [provider]  aspirin EC 81 MG tablet Take 1 tablet by mouth daily.   Yes [provider]  azelastine (ASTELIN) 0.1 % nasal spray Place 2 sprays into both nostrils 2 (two) times daily. 05/27/23  Yes [provider]  buprenorphine-naloxone (SUBOXONE) 8-2 mg SUBL SL tablet Place 1 tablet under the tongue in the morning, at noon, and at bedtime.   Yes [provider]  busPIRone (BUSPAR) 7.5 MG tablet Take 7.5 mg by mouth 2 (two) times daily. 04/30/23 04/29/24 Yes [provider]  clopidogrel (PLAVIX) 75 MG tablet Take 1 tablet (75 mg total) by mouth daily. 02/02/21  Yes Willeen Niece, MD  DULoxetine (CYMBALTA) 30 MG capsule Take 1 capsule by mouth daily. 01/17/16  Yes [provider]  ezetimibe (ZETIA) 10 MG tablet Take 10 mg by mouth daily. 02/24/20  Yes [provider]  FARXIGA 10 MG TABS tablet Take 10 mg by mouth daily. 09/14/20  Yes [provider]  Fluticasone-Umeclidin-Vilant (TRELEGY ELLIPTA) 100-62.5-25 MCG/ACT AEPB Inhale into the lungs. 09/12/21  Yes [provider]  gabapentin (NEURONTIN) 800 MG tablet Take 800 mg by mouth 4 (four) times daily. 07/14/21  Yes [provider]  hydrochlorothiazide (HYDRODIURIL) 12.5 MG tablet Take 12.5 mg by mouth daily. 05/29/23  Yes [provider]  metFORMIN (GLUCOPHAGE) 1000 MG tablet Take 1,000 mg by mouth 2 (two) times daily with a meal.   Yes [provider]  metoprolol succinate (TOPROL-XL) 50 MG 24 hr tablet  Take 50 mg by mouth daily. 02/24/20  Yes [provider]  pantoprazole (PROTONIX) 40 MG tablet Take 40 mg by mouth daily. 06/20/21  Yes [provider]  roflumilast (DALIRESP) 500 MCG TABS tablet Take 500 mcg by mouth daily. 08/15/21  Yes [provider]  simvastatin (ZOCOR) 40 MG tablet Take 1 tablet by mouth daily. 01/17/16  Yes [provider]  sulfamethoxazole-trimethoprim (BACTRIM DS) 800-160 MG tablet Take 1 tablet by mouth 2 (two) times daily.   Yes [provider]  cholecalciferol (VITAMIN D) 1000 units tablet Take 1,000 Units by mouth daily. Patient not taking: Reported on 10/25/2022    [provider]  clonazePAM (KLONOPIN) 1 MG tablet Take 1 mg by mouth 2 (two) times daily as needed for anxiety. Patient not taking: Reported on 10/25/2022    [provider]  cyanocobalamin (VITAMIN B12) 1000 MCG/ML injection Inject 1 mL (1,000 mcg total) into the muscle every 30 (thirty) days. Patient not taking: Reported on 07/02/2023 07/12/22   Earna Coder, MD  doxycycline (VIBRA-TABS) 100 MG tablet Take 1 tablet (100 mg total) by mouth 2 (two) times daily. Patient not taking: Reported on 10/25/2022 06/10/22   Creig Hines, MD  OXYGEN Inhale 2 L into the lungs.    [provider]  Syringe/Needle, Disp, (SYRINGE 3CC/25GX1") 25G X 1" 3 ML MISC 1 Syringe by Does not apply route every 30 (thirty) days. 07/12/22   Earna Coder, MD    Social History   Socioeconomic History   Marital status: Widowed    Spouse name: Not on file   Number of children: Not on file   Years of education: Not on file   Highest education level: Not on file  Occupational History   Not on file  Tobacco Use   Smoking status: Former    Current packs/day: 0.50    Average packs/day: 0.5 packs/day for 6.6 years (3.3 ttl pk-yrs)    Types: Cigarettes    Start date: 2018   Smokeless tobacco: Never  Vaping Use   Vaping status: Never Used  Substance  and Sexual Activity   Alcohol use: Yes    Alcohol/week: 1.0 standard drink of alcohol    Types: 1 Cans of beer per week    Comment: occ   Drug use: No   Sexual activity: Not Currently  Other Topics Concern   Not on file  Social History Narrative   Not on file   Social Determinants of Health   Financial Resource Strain: Low Risk  (04/19/2023)   Received from [facility]   Overall Financial Resource Strain (CARDIA)    Difficulty of Paying Living Expenses: Not hard at all  Food Insecurity: No Food Insecurity (07/02/2023)  Hunger Vital Sign    Worried About Running Out of Food in the Last Year: Never true    Ran Out of Food in the Last Year: Never true  Transportation Needs: No Transportation Needs (07/02/2023)   PRAPARE - Administrator, Civil Service (Medical): No    Lack of Transportation (Non-Medical): No  Physical Activity: Not on file  Stress: Not on file  Social Connections: Not on file  Intimate Partner Violence: Not At Risk (07/02/2023)   Humiliation, Afraid, Rape, and Kick questionnaire    Fear of Current or Ex-Partner: No    Emotionally Abused: No    Physically Abused: No    Sexually Abused: No     Family History  Problem Relation Age of Onset   Hypertension Mother     ROS: Otherwise negative unless mentioned in HPI  Physical Examination  Vitals:   07/03/23 0036 07/03/23 0436  BP: 108/73 107/64  Pulse: 88 77  Resp:    Temp: 97.9 F (36.6 C) 98.8 F (37.1 C)  SpO2: 98% 94%   Body mass index is 23.31 kg/m.  General:  WDWN in NAD Gait: Not observed HENT: WNL, normocephalic Pulmonary: normal non-labored breathing, without Rales, rhonchi,  wheezing Cardiac: regular, without  Murmurs, rubs or gallops; without carotid bruits Abdomen: positive bowel sounds, soft, NT/ND, no masses Skin: without rashes Vascular Exam/Pulses: Unable to palpate bilateral lower extremity pulses. Warm to touch, left heel ulcer penetrating through  with 2 openings. See Media Files.  Extremities: with ischemic changes, without Gangrene , with cellulitis; with open wounds;  Musculoskeletal: no muscle wasting or atrophy  Neurologic: A&O X 3;  No focal weakness or paresthesias are detected; speech is fluent/normal Psychiatric:  The pt has Normal affect. Lymph:  Unremarkable  CBC    Component Value Date/Time   WBC 6.8 07/03/2023 0331   RBC 3.13 (L) 07/03/2023 0331   HGB 9.9 (L) 07/03/2023 0331   HGB 13.9 05/17/2014 1424   HCT 30.9 (L) 07/03/2023 0331   HCT 44.6 03/31/2014 1815   PLT 219 07/03/2023 0331   PLT 261 03/31/2014 1815   MCV 98.7 07/03/2023 0331   MCV 96 03/31/2014 1815   MCH 31.6 07/03/2023 0331   MCHC 32.0 07/03/2023 0331   RDW 15.7 (H) 07/03/2023 0331   RDW 13.7 03/31/2014 1815   LYMPHSABS 2.7 10/25/2022 0923   LYMPHSABS 0.6 (L) 09/25/2013 0356   MONOABS 0.5 10/25/2022 0923   MONOABS 0.1 (L) 09/25/2013 0356   EOSABS 0.6 (H) 10/25/2022 0923   EOSABS 0.0 09/25/2013 0356   BASOSABS 0.1 10/25/2022 0923   BASOSABS 0.0 09/25/2013 0356    BMET    Component Value Date/Time   NA 139 07/03/2023 0331   NA 136 03/31/2014 1815   K 3.8 07/03/2023 0331   K 3.5 05/17/2014 1424   CL 101 07/03/2023 0331   CL 100 03/31/2014 1815   CO2 28 07/03/2023 0331   CO2 30 03/31/2014 1815   GLUCOSE 99 07/03/2023 0331   GLUCOSE 91 05/17/2014 1424   BUN 13 07/03/2023 0331   BUN 13 03/31/2014 1815   CREATININE 1.20 07/03/2023 0331   CREATININE 1.05 03/31/2014 1815   CALCIUM 8.1 (L) 07/03/2023 0331   CALCIUM 9.3 03/31/2014 1815   GFRNONAA >60 07/03/2023 0331   GFRNONAA >60 03/31/2014 1815   GFRAA >60 03/28/2020 0427   GFRAA >60 03/31/2014 1815    COAGS: Lab Results  Component Value Date   INR 1.1 07/02/2023   INR  1.1 11/12/2020   INR 1.1 03/25/2020     Non-Invasive Vascular Imaging:   EXAM:06/30/23 MRI OF THE LEFT ANKLE WITHOUT CONTRAST   TECHNIQUE: Multiplanar, multisequence MR imaging of the ankle was performed.  No intravenous contrast was administered.   COMPARISON:  Left foot x-ray 05/11/2014   FINDINGS: Bones/Joint/Cartilage   Subcortical bone marrow edema within the plantar aspect of the posterior calcaneus. Possible shallow cortical erosion near the plantar fascia enthesis site (series 5, image 13). No additional sites of erosion or marrow replacement.   No acute fracture. 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 Chronic fragmentation of the navicular. Large area of bone infarction within the distal tibia.   Ligaments   Lateral ankle ligaments are likely chronically torn with thickened joint capsule. No evidence of an acute ligamentous injury.   Muscles and Tendons   Denervation changes of the lower leg and foot musculature. Small volume tenosynovial fluid within the tibialis posterior tendon sheath.   Soft tissues   Plantar soft tissue ulceration underlying the posterior calcaneus. Adjacent subcutaneous edema. No organized fluid collections.   IMPRESSION: 1. Plantar soft tissue ulceration underlying the posterior calcaneus with subcortical bone marrow edema within the plantar aspect of the posterior calcaneus with possible shallow cortical erosion near the plantar fascia enthesis site. Findings are concerning for early osteomyelitis. 2. 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  Statin:  Yes.   Beta Blocker:  No. Aspirin:  Yes.   ACEI:  No. ARB:  No. CCB use:  No Other antiplatelets/anticoagulants:  No.    ASSESSMENT/PLAN: This is a 67 y.o. male who presents to [facility] Emergency room with pain, redness, swelling and drainage to his left heel. He was scheduled outpatient with vascular surgery.  PLAN: Vascular surgery plans on taking the patient to the vascular lab today for a left lower extremity angiogram with possible intervention. I discussed in detail with the  patient the procedure, benefits, risks and complications. He verbalizes his understanding and wishes to proceed. I answered all the patients questions. Patient remains NPO for the procedure.     -I discussed the plan with Dr Festus Barren MD and he agrees with the plan.    [facility] 07/03/2023 7:16 AM

## 2023-07-03 NOTE — Progress Notes (Signed)
Progress Note   Patient: Christian Fields QVZ:[id number] DOB: July 09, 1956 DOA: 07/02/2023     1 DOS: the patient was seen and examined on 07/03/2023   Brief hospital course: Taken from H&P.  Christian Fields is a 67 y.o. male with medical history significant of chronic left foot heel ulcer, COPD on 2-3 L oxygen, hypertension, hyperlipidemia, diabetes mellitus, asthma, TIA, GERD, gout, depression, OSA not on CPAP, anemia, AAA, polysubstance abuse, chronic pain syndrome on Suboxone, non-small cell lung cancer (s/p of RXT), tobacco abuse, CKD-2, who presents with fatigue, low blood pressure, left lower leg pain.  Patient with worsening left lower extremity erythema and discharge from chronic heel ulcer.  No fever or chills.  Data reviewed independently and ED Course: Initial blood pressure 85/76 which improved to 113/66 with 1 L of LR bolus.  Labs pertinent for lactic acid of 4, worsening renal function, negative COVID PCR.  Saturating well on room air.  UA with microscopic hematuria. Patient had MRI of heel which is concerning for osteomyelitis involving calcaneus. Chest x-ray with hyperinflation with chronic changes. Stable nodularity along the peripheral left upper lobe compared to recent CT.  EKG:  Sinus rhythm, QTc 459, I will LAE, LAD, poor R wave progression. Patient was started on Rocephin and vancomycin and Podiatry and vascular surgery was consulted.  7/25: Vital stable, hemoglobin decreased to 9.9 but all cell lines decreased, some dilutional effect. AKI resolved.  Lactic acidosis resolved.  Patient had his lower extremity angiography and angioplasty with vascular surgery today, 2 stents was placed in right SFA.  Blood cultures remain negative.  Patient does not want debridement at this time, will be going home tomorrow morning and discussed with his on podiatrist later on.  No more diarrhea.    Assessment and Plan: * Cellulitis of left lower extremity Diabetic foot ulcer.  Patient did had  initial lactic acidosis which has been resolved, did not met sepsis criteria.  Podiatry and vascular surgery was consulted. 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  Preliminary blood culture negative.  Elevated ESR and CRP. S/p right lower extremity angiography and angioplasty Podiatry recommended debridement tomorrow but patient refused, stating that he will discuss with his own podiatrist before proceeding and wants to go home now. -Continue with ceftriaxone and vancomycin  Peripheral arterial disease (HCC) S/p lower extremity angiography and angioplasty of right SFA. -Started on DAPT with aspirin and Plavix -Continue with statin  Elevated lactic acid level Initial lactic acid of 4, did not meet sepsis criteria. -Resolved  Acute renal failure superimposed on stage 2 chronic kidney disease (HCC) Resolved with IV fluid.  Creatinine now at baseline. -Monitor renal function -Avoid nephrotoxins  Chronic diastolic CHF (congestive heart failure) (HCC)  2D echo on 04/21/2023 showed EF> 55%.  Patient has leg edema, BNP is minimally elevated 203, no shortness of breath.  No oxygen desaturation, does not seem to have CHF exacerbation. -Watch volume status closely-patient did receive some IV fluid  Type 2 diabetes mellitus with foot ulcer (CODE) (HCC) Recent A1c 4.9, well-controlled.  Patient taking metformin and Farxiga -SSI  Abnormal LFTs Mildly elevated ALT and alkaline phosphatase, possibly due to alcohol use.  Patient drinks liquor 2-3 times each week. Acute hepatitis panel negative.  HLD (hyperlipidemia) -Continue home Zocor and Zetia  Hypertension Home antihypertensives were held initially due to softer blood pressure. Blood pressure now started trending up. -Restart home HCTZ and metoprolol  Chronic pain syndrome -Continue home Suboxone  COPD (chronic obstructive  pulmonary disease) (HCC) No concern of exacerbation. -Continue bronchodilator  Depression  with anxiety -Continue home meds  Sleep apnea -Currently not using CPAP   Subjective: Patient was seen after the vascular procedure today.  Does not want to proceed with debridement at this time, stating that he wants to see his own podiatrist who is on vacation until next week before deciding any procedure.  Physical Exam: Vitals:   07/03/23 1041 07/03/23 1100 07/03/23 1115 07/03/23 1130  BP: 131/71 (!) 135/93 (!) 145/76 (!) 144/78  Pulse: 86 88 85 87  Resp: 17 15 14 17   Temp:      TempSrc:      SpO2: 94% 92% 93% 92%  Weight:      Height:       General.  Well-developed gentleman, in no acute distress. Pulmonary.  Lungs clear bilaterally, normal respiratory effort. CV.  Regular rate and rhythm, no JVD, rub or murmur. Abdomen.  Soft, nontender, nondistended, BS positive. CNS.  Alert and oriented .  No focal neurologic deficit. Extremities.  No edema,  pulses intact and symmetrical.  Left foot with bandage. Psychiatry.  Judgment and insight appears normal.   Data Reviewed: Prior data reviewed  Family Communication: Discussed with patient  Disposition: Status is: Inpatient Remains inpatient appropriate because: Severity of illness  Planned Discharge Destination: Home  DVT prophylaxis.  Subcu heparin Time spent: 50 minutes  This record has been created using Conservation officer, historic buildings. Errors have been sought and corrected,but may not always be located. Such creation errors do not reflect on the standard of care.   Author: Arnetha Courser, MD 07/03/2023 3:02 PM  For on call review [email or web address].

## 2023-07-03 NOTE — Assessment & Plan Note (Addendum)
Mildly elevated ALT and alkaline phosphatase, possibly due to alcohol use.  Patient drinks liquor 2-3 times each week. Acute hepatitis panel negative.

## 2023-07-03 NOTE — Hospital Course (Addendum)
Taken from H&P.  Christian Fields is a 67 y.o. male with medical history significant of chronic left foot heel ulcer, COPD on 2-3 L oxygen, hypertension, hyperlipidemia, diabetes mellitus, asthma, TIA, GERD, gout, depression, OSA not on CPAP, anemia, AAA, polysubstance abuse, chronic pain syndrome on Suboxone, non-small cell lung cancer (s/p of RXT), tobacco abuse, CKD-2, who presents with fatigue, low blood pressure, left lower leg pain.  Patient with worsening left lower extremity erythema and discharge from chronic heel ulcer.  No fever or chills.  Data reviewed independently and ED Course: Initial blood pressure 85/76 which improved to 113/66 with 1 L of LR bolus.  Labs pertinent for lactic acid of 4, worsening renal function, negative COVID PCR.  Saturating well on room air.  UA with microscopic hematuria. Patient had MRI of heel which is concerning for osteomyelitis involving calcaneus. Chest x-ray with hyperinflation with chronic changes. Stable nodularity along the peripheral left upper lobe compared to recent CT.  EKG:  Sinus rhythm, QTc 459, I will LAE, LAD, poor R wave progression. Patient was started on Rocephin and vancomycin and Podiatry and vascular surgery was consulted.  7/25: Vital stable, hemoglobin decreased to 9.9 but all cell lines decreased, some dilutional effect. AKI resolved.  Lactic acidosis resolved.  Patient had his lower extremity angiography and angioplasty with vascular surgery today, 2 stents was placed in right SFA.  Blood cultures remain negative.  Patient does not want debridement at this time, will be going home tomorrow morning and discussed with his on podiatrist later on.  No more diarrhea.  7/26: Patient remained hemodynamically stable.  Wants to go home.  Podiatry has an extensive conversation regarding the importance of doing at least wound and bone debridement due to concern of osteomyelitis with diabetic foot ulcer, patient understand the risk of losing limb  but wants to see his own podiatrist and he will follow his recommendations.  Patient received ceftriaxone and vancomycin while in the hospital and is being discharged on Augmentin and doxycycline for 10 days, has an appointment with his podiatrist on Tuesday and they will take over his care at that point.  Patient will continue on current medication.  Wound care instructions are provided and he will follow-up with his providers closely for further management.

## 2023-07-03 NOTE — Assessment & Plan Note (Signed)
-  Continue home Zocor and Zetia

## 2023-07-03 NOTE — Assessment & Plan Note (Signed)
Resolved with IV fluid.  Creatinine now at baseline. -Monitor renal function -Avoid nephrotoxins

## 2023-07-03 NOTE — Assessment & Plan Note (Signed)
-   Continue home Suboxone 

## 2023-07-03 NOTE — Assessment & Plan Note (Signed)
No concern of exacerbation. -Continue bronchodilator

## 2023-07-03 NOTE — Interval H&P Note (Signed)
History and Physical Interval Note:  07/03/2023 10:11 AM  Christian Fields  has presented today for surgery, with the diagnosis of PAD with ulcer.  The various methods of treatment have been discussed with the patient and family. After consideration of risks, benefits and other options for treatment, the patient has consented to  Procedure(s): Lower Extremity Angiography (Left) as a surgical intervention.  The patient's history has been reviewed, patient examined, no change in status, stable for surgery.  I have reviewed the patient's chart and labs.  Questions were answered to the patient's satisfaction.     Festus Barren

## 2023-07-03 NOTE — Consult Note (Signed)
Pharmacy Antibiotic Note  Christian Fields is a 67 y.o. male with PMH including chronic left foot heel ulcer, COPD on 2-3L O2, HTN, HLD, DM, asthma, TIA, GERD, gout, depression, OSA not on CPAP, anemia, AAA, polysubstance abuse, chronic pain syndrome on Suboxone, non-small cell lung cancer, tobacco use disorder, CKD  admitted on 07/02/2023 with  cellulitis of left lower extremity / diabetic foot ulcer . Vascular surgery consulted to assess for circulation and plan is for LLE angiogram with possible intervention 7/25. Podiatry consulted and plan is for debridement after conclusion of vascular evaluation. Pharmacy has been consulted for vancomycin dosing. Patient is also ordered ceftriaxone.  Plan:  Vancomycin 1.5 g IV LD in ED 7/24  Start vancomycin 1.25 g IV q24h --Calculated AUC: 517, Cmin: 12 --Scr 1.2, IBW, Vd 0.72 --Daily Scr per protocol --Levels at steady state or as clinically indicated  Height: 5\' 7"  (170.2 cm) Weight: 67.5 kg (148 lb 13 oz) IBW/kg (Calculated) : 66.1  Temp (24hrs), Avg:98.1 F (36.7 C), Min:97.8 F (36.6 C), Max:98.8 F (37.1 C)  Recent Labs  Lab 07/02/23 1206 07/02/23 1406 07/02/23 1628 07/02/23 1829 07/02/23 2131 07/03/23 0331  WBC 9.3  --   --   --   --  6.8  CREATININE 1.63*  --   --   --   --  1.20  LATICACIDVEN 4.0* 3.5* 2.3* 1.8 1.5  --     Estimated Creatinine Clearance: 55.1 mL/min (by C-G formula based on SCr of 1.2 mg/dL).    Allergies  Allergen Reactions   Bee Venom Anaphylaxis    Antimicrobials this admission: Ceftriaxone 7/24 >>  Vancomycin 7/24 >>   Dose adjustments this admission: N/A  Microbiology results: 7/24 BCx: NGTD  Thank you for allowing pharmacy to be a part of this patient's care.  Tressie Ellis 07/03/2023 8:30 AM

## 2023-07-03 NOTE — Assessment & Plan Note (Signed)
Initial lactic acid of 4, did not meet sepsis criteria. -Resolved

## 2023-07-03 NOTE — Assessment & Plan Note (Addendum)
Diabetic foot ulcer.  Patient did had initial lactic acidosis which has been resolved, did not met sepsis criteria.  Podiatry and vascular surgery was consulted. 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  Preliminary blood culture negative.  Elevated ESR and CRP. S/p right lower extremity angiography and angioplasty Podiatry recommended debridement tomorrow but patient refused, stating that he will discuss with his own podiatrist before proceeding and wants to go home now. -Continue with ceftriaxone and vancomycin

## 2023-07-03 NOTE — Assessment & Plan Note (Signed)
Home antihypertensives were held initially due to softer blood pressure. Blood pressure now started trending up. -Restart home HCTZ and metoprolol

## 2023-07-04 ENCOUNTER — Encounter: Payer: Self-pay | Admitting: Vascular Surgery

## 2023-07-04 DIAGNOSIS — R7989 Other specified abnormal findings of blood chemistry: Secondary | ICD-10-CM | POA: Diagnosis not present

## 2023-07-04 DIAGNOSIS — J449 Chronic obstructive pulmonary disease, unspecified: Secondary | ICD-10-CM

## 2023-07-04 DIAGNOSIS — L03116 Cellulitis of left lower limb: Secondary | ICD-10-CM | POA: Diagnosis not present

## 2023-07-04 DIAGNOSIS — E11621 Type 2 diabetes mellitus with foot ulcer: Secondary | ICD-10-CM | POA: Diagnosis not present

## 2023-07-04 DIAGNOSIS — L03032 Cellulitis of left toe: Secondary | ICD-10-CM | POA: Diagnosis not present

## 2023-07-04 DIAGNOSIS — G4730 Sleep apnea, unspecified: Secondary | ICD-10-CM

## 2023-07-04 DIAGNOSIS — L02612 Cutaneous abscess of left foot: Secondary | ICD-10-CM

## 2023-07-04 LAB — GLUCOSE, CAPILLARY: Glucose-Capillary: 92 mg/dL (ref 70–99)

## 2023-07-04 SURGERY — IRRIGATION AND DEBRIDEMENT FOOT
Anesthesia: Choice | Laterality: Left

## 2023-07-04 MED ORDER — DOXYCYCLINE HYCLATE 100 MG PO TABS: 100.0000 mg | ORAL_TABLET | Freq: Two times a day (BID) | ORAL | 0 refills | Status: DC

## 2023-07-04 MED ORDER — AMOXICILLIN-POT CLAVULANATE 875-125 MG PO TABS: 1.0000 | ORAL_TABLET | Freq: Two times a day (BID) | ORAL | 0 refills | Status: DC

## 2023-07-04 NOTE — Assessment & Plan Note (Signed)
Diabetic foot ulcer.  Patient did had initial lactic acidosis which has been resolved, did not met sepsis criteria.  Podiatry and vascular surgery was consulted. 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  Preliminary blood culture negative.  Elevated ESR and CRP. S/p right lower extremity angiography and angioplasty Podiatry recommended debridement tomorrow but patient refused, stating that he will discuss with his own podiatrist before proceeding and wants to go home now. -Continue with ceftriaxone and vancomycin while in the hospital and is being discharged on Augmentin and doxycycline.

## 2023-07-04 NOTE — Progress Notes (Signed)
Pt being discharged, went through discharge instruction with him, verbalize understanding, dressing has changed before discharge on left ankle and foot.

## 2023-07-04 NOTE — Discharge Summary (Signed)
Physician Discharge Summary   Patient: Christian Fields MRN: [id number] DOB: 25-Jan-1956  Admit date:     07/02/2023  Discharge date: 07/04/23  Discharge Physician: Arnetha Courser   PCP: Alease Medina, MD   Recommendations at discharge:  Please obtain CBC and BMP in a week Patient is being discharged on 10 days of antibiotic, podiatrist will take over from there. 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  He will at least need debridement. 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  Christian Fields is a 67 y.o. male with medical history significant of chronic left foot heel ulcer, COPD on 2-3 L oxygen, hypertension, hyperlipidemia, diabetes mellitus, asthma, TIA, GERD, gout, depression, OSA not on CPAP, anemia, AAA, polysubstance abuse, chronic pain syndrome on Suboxone, non-small cell lung cancer (s/p of RXT), tobacco abuse, CKD-2, who presents with fatigue, low blood pressure, left lower leg pain.  Patient with worsening left lower extremity erythema and discharge from chronic heel ulcer.  No fever or chills.  Data reviewed independently and ED Course: Initial blood pressure 85/76 which improved to 113/66 with 1 L of LR bolus.  Labs pertinent for  lactic acid of 4, worsening renal function, negative COVID PCR.  Saturating well on room air.  UA with microscopic hematuria. Patient had MRI of heel which is concerning for osteomyelitis involving calcaneus. Chest x-ray with hyperinflation with chronic changes. Stable nodularity along the peripheral left upper lobe compared to recent CT.  EKG:  Sinus rhythm, QTc 459, I will LAE, LAD, poor R wave progression. Patient was started on Rocephin and vancomycin and Podiatry and vascular surgery was consulted.  7/25: Vital stable, hemoglobin decreased to 9.9 but all cell lines decreased, some dilutional effect. AKI resolved.  Lactic acidosis resolved.  Patient had his lower extremity angiography and angioplasty with vascular surgery today, 2 stents was placed in right SFA.  Blood cultures remain negative.  Patient does not want debridement at this time, will be going home tomorrow morning and discussed with his on podiatrist later on.  No more diarrhea.  7/26: Patient remained hemodynamically stable.  Wants to go home.  Podiatry has an extensive conversation regarding the importance of doing at least wound and bone debridement due to concern of osteomyelitis with diabetic foot ulcer, patient understand the risk of losing limb but wants to see his own podiatrist and he will follow his recommendations.  Patient received ceftriaxone and vancomycin while in the hospital and is being discharged on Augmentin and doxycycline for 10 days, has an appointment with his podiatrist on Tuesday and they will take over his care at that point.  Patient will continue on current medication.  Wound care instructions are provided and he will follow-up with his providers  closely for further management.  Assessment and Plan: * Cellulitis of left lower extremity Diabetic foot ulcer.  Patient did had initial lactic acidosis which has been resolved, did not met sepsis criteria.  Podiatry and vascular surgery was consulted. 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  Preliminary blood culture negative.  Elevated ESR and CRP. S/p right lower extremity angiography and angioplasty Podiatry recommended debridement tomorrow but patient refused, stating that he will discuss with his own podiatrist before proceeding and wants to go home now. -Continue with ceftriaxone and vancomycin while in the hospital and is being discharged on Augmentin and doxycycline.  Peripheral arterial disease (HCC) S/p lower extremity angiography and angioplasty of right SFA. -Started on DAPT with aspirin and Plavix -Continue with statin  Elevated lactic acid level Initial lactic acid of 4, did not meet sepsis criteria. -Resolved  Acute renal failure superimposed on stage 2 chronic kidney disease (HCC) Resolved with IV fluid.  Creatinine now at baseline. -Monitor renal function -Avoid nephrotoxins  Chronic diastolic CHF (congestive heart failure) (HCC)  2D echo on 04/21/2023 showed EF> 55%.  Patient has leg edema, BNP is minimally elevated 203, no shortness of breath.  No oxygen desaturation, does not seem to have CHF exacerbation. -Watch volume status closely-patient did receive some IV fluid  Type 2 diabetes mellitus with foot ulcer (CODE) (HCC) Recent A1c 4.9, well-controlled.  Patient taking metformin and Farxiga -SSI  Abnormal LFTs Mildly elevated ALT and alkaline phosphatase, possibly due to alcohol use.  Patient drinks liquor 2-3 times each week. Acute hepatitis panel negative.  HLD (hyperlipidemia) -Continue home Zocor and Zetia  Hypertension Home antihypertensives were held initially due to softer blood pressure. Blood pressure now started trending up. -Restart home HCTZ and metoprolol  Chronic pain syndrome -Continue home Suboxone  COPD (chronic obstructive pulmonary disease) (HCC) No concern of exacerbation. -Continue bronchodilator  Depression with anxiety -Continue home meds  Sleep  apnea -Currently not using CPAP   Pain control - [place] Controlled Substance Reporting System database was reviewed. and patient was instructed, not to drive, operate heavy machinery, perform activities at heights, swimming or participation in water activities or provide baby-sitting services while on Pain, Sleep and Anxiety Medications; until their outpatient Physician has advised to do so again. Also recommended to not to take more than prescribed Pain, Sleep and Anxiety Medications.  Consultants: Podiatry.  Vascular surgery Procedures performed: Lower extremity angiography and angioplasty Disposition: Home Diet recommendation:  Discharge Diet Orders (From admission, onward)     Start     Ordered   07/04/23 0000  Diet - low sodium heart healthy        07/04/23 0952           Cardiac and Carb modified diet DISCHARGE MEDICATION: Allergies as of 07/04/2023       Reactions   Bee Venom Anaphylaxis        Medication List     STOP taking these medications    clonazePAM 1 MG tablet Commonly known as: KLONOPIN   sulfamethoxazole-trimethoprim 800-160 MG tablet Commonly known as: BACTRIM DS       TAKE these medications    acetaminophen 325 MG tablet Commonly known as: TYLENOL Take 650 mg by mouth every 6 (six) hours as needed.   albuterol 108 (90 Base) MCG/ACT inhaler Commonly known as: VENTOLIN HFA Inhale 2 puffs into the lungs every 4 (four) hours as needed for wheezing or shortness of breath.   albuterol (2.5 MG/3ML) 0.083%  nebulizer solution Commonly known as: PROVENTIL Take 2.5 mg by nebulization every 4 (four) hours as needed.   allopurinol 300 MG tablet Commonly known as: ZYLOPRIM Take 600 mg by mouth daily.   amoxicillin-clavulanate 875-125 MG tablet Commonly known as: AUGMENTIN Take 1 tablet by mouth 2 (two) times daily for 10 days.   aspirin EC 81 MG tablet Take 1 tablet by mouth daily.   azelastine 0.1 % nasal spray Commonly known as:  ASTELIN Place 2 sprays into both nostrils 2 (two) times daily.   buprenorphine-naloxone 8-2 mg Subl SL tablet Commonly known as: SUBOXONE Place 1 tablet under the tongue in the morning, at noon, and at bedtime.   busPIRone 7.5 MG tablet Commonly known as: BUSPAR Take 7.5 mg by mouth 2 (two) times daily.   cholecalciferol 1000 units tablet Commonly known as: VITAMIN D Take 1,000 Units by mouth daily.   clopidogrel 75 MG tablet Commonly known as: PLAVIX Take 1 tablet (75 mg total) by mouth daily.   cyanocobalamin 1000 MCG/ML injection Commonly known as: VITAMIN B12 Inject 1 mL (1,000 mcg total) into the muscle every 30 (thirty) days.   doxycycline 100 MG tablet Commonly known as: VIBRA-TABS Take 1 tablet (100 mg total) by mouth 2 (two) times daily for 10 days.   DULoxetine 30 MG capsule Commonly known as: CYMBALTA Take 1 capsule by mouth daily.   ezetimibe 10 MG tablet Commonly known as: ZETIA Take 10 mg by mouth daily.   Farxiga 10 MG Tabs tablet Generic drug: dapagliflozin propanediol Take 10 mg by mouth daily.   gabapentin 800 MG tablet Commonly known as: NEURONTIN Take 800 mg by mouth 4 (four) times daily.   hydrochlorothiazide 12.5 MG tablet Commonly known as: HYDRODIURIL Take 12.5 mg by mouth daily.   metFORMIN 1000 MG tablet Commonly known as: GLUCOPHAGE Take 1,000 mg by mouth 2 (two) times daily with a meal.   metoprolol succinate 50 MG 24 hr tablet Commonly known as: TOPROL-XL Take 50 mg by mouth daily.   OXYGEN Inhale 2 L into the lungs.   pantoprazole 40 MG tablet Commonly known as: PROTONIX Take 40 mg by mouth daily.   roflumilast 500 MCG Tabs tablet Commonly known as: DALIRESP Take 500 mcg by mouth daily.   simvastatin 40 MG tablet Commonly known as: ZOCOR Take 1 tablet by mouth daily.   SYRINGE 3CC/25GX1" 25G X 1" 3 ML Misc 1 Syringe by Does not apply route every 30 (thirty) days.   Trelegy Ellipta 100-62.5-25 MCG/ACT Aepb Generic  drug: Fluticasone-Umeclidin-Vilant Inhale into the lungs.               Discharge Care Instructions  (From admission, onward)           Start     Ordered   07/04/23 0000  Discharge wound care:       Comments: Clean L heel ulcer with NS, cut a strip of silver hydrofiber [facility] [place] [phone]) and using a Q tip applicator insert silver beginning at heel wound and through tunnel to L midfoot wound, fill remaining wound bed at left heel with silver.  This should be performed daily. Cover with dry gauze and wrap foot with Kerlix roll gauze and secure with tape.   Clean L 5th digit and R great toe ulcers with NS, paint with Betadine twice daily. May leave open to air or cover with gauze after Betadine is dry.   07/04/23 0454  Follow-up Information     Ziglar, Eli Phillips, MD. Schedule an appointment as soon as possible for a visit in 1 week(s).   Specialty: Family Medicine Contact information: [address] [place] [address] [phone]         Gwyneth Revels, DPM Follow up.   Specialty: Podiatry Contact information: [address] [place] [address] [phone]                Discharge Exam: Filed Weights   07/02/23 2113 07/03/23 0121 07/04/23 0110  Weight: 67.5 kg 67.5 kg 70.8 kg   General.  Well-developed gentleman, in no acute distress. Pulmonary.  Lungs clear bilaterally, normal respiratory effort. CV.  Regular rate and rhythm, no JVD, rub or murmur. Abdomen.  Soft, nontender, nondistended, BS positive. CNS.  Alert and oriented .  No focal neurologic deficit. Extremities.  No edema, no cyanosis, pulses intact and symmetrical. Psychiatry.  Judgment and insight appears normal.   Condition at discharge: stable  The results of significant diagnostics from this hospitalization (including imaging, microbiology, ancillary and laboratory) are listed below for reference.   Imaging Studies: PERIPHERAL VASCULAR CATHETERIZATION  Result  Date: 07/03/2023 See surgical note for result.  US Venous Img Lower Bilateral (DVT)  Result Date: 07/02/2023 CLINICAL DATA:  Lower extremity swelling EXAM: BILATERAL LOWER EXTREMITY VENOUS DOPPLER ULTRASOUND TECHNIQUE: Gray-scale sonography with graded compression, as well as color Doppler and duplex ultrasound were performed to evaluate the lower extremity deep venous systems from the level of the common femoral vein and including the common femoral, femoral, profunda femoral, popliteal and calf veins including the posterior tibial, peroneal and gastrocnemius veins when visible. The superficial great saphenous vein was also interrogated. Spectral Doppler was utilized to evaluate flow at rest and with distal augmentation maneuvers in the common femoral, femoral and popliteal veins. COMPARISON:  None Available. FINDINGS: RIGHT LOWER EXTREMITY Common Femoral Vein: No evidence of thrombus. Normal compressibility, respiratory phasicity and response to augmentation. Saphenofemoral Junction: No evidence of thrombus. Normal compressibility and flow on color Doppler imaging. Profunda Femoral Vein: No evidence of thrombus. Normal compressibility and flow on color Doppler imaging. Femoral Vein: No evidence of thrombus. Normal compressibility, respiratory phasicity and response to augmentation. Popliteal Vein: No evidence of thrombus. Normal compressibility, respiratory phasicity and response to augmentation. Calf Veins: No evidence of thrombus. Normal compressibility and flow on color Doppler imaging. Superficial Great Saphenous Vein: No evidence of thrombus. Normal compressibility. Venous Reflux:  None. Other Findings:  None. LEFT LOWER EXTREMITY Common Femoral Vein: No evidence of thrombus. Normal compressibility, respiratory phasicity and response to augmentation. Saphenofemoral Junction: No evidence of thrombus. Normal compressibility and flow on color Doppler imaging. Profunda Femoral Vein: No evidence of thrombus.  Normal compressibility and flow on color Doppler imaging. Femoral Vein: No evidence of thrombus. Normal compressibility, respiratory phasicity and response to augmentation. Popliteal Vein: No evidence of thrombus. Normal compressibility, respiratory phasicity and response to augmentation. Calf Veins: No evidence of thrombus. Normal compressibility and flow on color Doppler imaging. Superficial Great Saphenous Vein: No evidence of thrombus. Normal compressibility. Venous Reflux:  None. Other Findings:  None. IMPRESSION: No evidence of deep venous thrombosis in either lower extremity. Electronically Signed   By: Alcide Clever M.D.   On: 07/02/2023 23:50   DG Foot Complete Left  Result Date: 07/02/2023 CLINICAL DATA:  Chronic ulcers to heel and medial foot with worsening surrounding cellulitis. EXAM: LEFT FOOT - COMPLETE 3+ VIEW COMPARISON:  Left foot radiographs 05/11/2014; MRI left ankle  06/23/2023 FINDINGS: There is diffuse decreased bone mineralization. There is plate and screw fixation of the previously seen comminuted navicular fracture on 05/11/2014 radiographs. Increased moderate plantar calcaneal heel spur. Chronic 8 mm well corticated ossicle dorsal to the talar neck. There appears to be new mild collapse of the talar neck with respect to the remote navicular fracture. There is a horizontal lucency within the distal dorsal aspect of the cuneiforms on lateral view which may represent an acute to subacute fracture, although no trauma is reported. There is new high-grade plantar and lateral angulation of the great toe distal phalanx with possible fusion at the interphalangeal joint. Mild lucency within the heel soft tissues plantar to the calcaneal heel spur. No definitive cortical erosion is identified, however note is made that marrow edema and likely early cortical erosion were seen within the plantar aspect of the calcaneus on recent 06/23/2023 MRI. No subcutaneous air. IMPRESSION: Compared to 05/11/2014:  1. Mild lucency within the heel soft tissues plantar to the calcaneal heel spur consistent with a ulceration seen on recent MRI. No definitive cortical erosion is identified, however note is made the more sensitive MRI head findings concerning for early osteomyelitis of the plantar calcaneus. No subcutaneous air. 2. There is a horizontal lucency within the distal dorsal aspect of the cuneiforms on lateral view which may represent an acute to subacute fracture, although no trauma is reported. This could represent a chronic finding. 3. There is new high-grade plantar and lateral angulation of the great toe distal phalanx with possible fusion at the interphalangeal joint. 4. Increased moderate plantar calcaneal heel spur. Electronically Signed   By: Neita Garnet M.D.   On: 07/02/2023 15:19   DG Chest Port 1 View  Result Date: 07/02/2023 CLINICAL DATA:  Sepsis.  Lung cancer. EXAM: PORTABLE CHEST 1 VIEW COMPARISON:  08/05/2021 x-ray.  CT 01/24/2023 FINDINGS: Hyperinflation. Chronic lung changes identified. Smaller area of nodularity along the peripheral left upper lobe is similar to the prior CT scan. No pneumothorax, effusion or edema. Normal cardiopericardial silhouette. Calcified aorta. Overlapping cardiac leads. Enlarged pulmonary arteries again seen as on prior CT. Overlapping cardiac leads. IMPRESSION: Hyperinflation with chronic changes. Stable nodularity along the peripheral left upper lobe compared to recent CT. Electronically Signed   By: Karen Kays M.D.   On: 07/02/2023 12:50   MR ANKLE LEFT WO CONTRAST  Result Date: 06/30/2023 CLINICAL DATA:  Left heel ulcer EXAM: MRI OF THE LEFT ANKLE WITHOUT CONTRAST TECHNIQUE: Multiplanar, multisequence MR imaging of the ankle was performed. No intravenous contrast was administered. COMPARISON:  Left foot x-ray 05/11/2014 FINDINGS: Bones/Joint/Cartilage Subcortical bone marrow edema within the plantar aspect of the posterior calcaneus. Possible shallow cortical  erosion near the plantar fascia enthesis site (series 5, image 13). No additional sites of erosion or marrow replacement. No acute fracture. 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 Chronic fragmentation of the navicular. Large area of bone infarction within the distal tibia. Ligaments Lateral ankle ligaments are likely chronically torn with thickened joint capsule. No evidence of an acute ligamentous injury. Muscles and Tendons Denervation changes of the lower leg and foot musculature. Small volume tenosynovial fluid within the tibialis posterior tendon sheath. Soft tissues Plantar soft tissue ulceration underlying the posterior calcaneus. Adjacent subcutaneous edema. No organized fluid collections. IMPRESSION: 1. Plantar soft tissue ulceration underlying the posterior calcaneus with subcortical bone marrow edema within the plantar aspect of the posterior calcaneus with possible shallow cortical erosion near  the plantar fascia enthesis site. Findings are concerning for early osteomyelitis. 2. 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 Electronically Signed   By: Duanne Guess D.O.   On: 06/30/2023 10:18    Microbiology: Results for orders placed or performed during the hospital encounter of 07/02/23  Resp panel by RT-PCR (RSV, Flu A&B, Covid) Anterior Nasal Swab     Status: None   Collection Time: 07/02/23 12:06 PM   Specimen: Anterior Nasal Swab  Result Value Ref Range Status   SARS Coronavirus 2 by RT PCR NEGATIVE NEGATIVE Final    Comment: (NOTE) SARS-CoV-2 target nucleic acids are NOT DETECTED.  The SARS-CoV-2 RNA is generally detectable in upper respiratory specimens during the acute phase of infection. The lowest concentration of SARS-CoV-2 viral copies this assay can detect is 138 copies/mL. A negative result does not preclude SARS-Cov-2 infection and should not be used as the sole  basis for treatment or other patient management decisions. A negative result may occur with  improper specimen collection/handling, submission of specimen other than nasopharyngeal swab, presence of viral mutation(s) within the areas targeted by this assay, and inadequate number of viral copies(<138 copies/mL). A negative result must be combined with clinical observations, patient history, and epidemiological information. The expected result is Negative.  Fact Sheet for Patients:  BloggerCourse.com  Fact Sheet for Healthcare Providers:  SeriousBroker.it  This test is no t yet approved or cleared by the [place] FDA and  has been authorized for detection and/or diagnosis of SARS-CoV-2 by FDA under an Emergency Use Authorization (EUA). This EUA will remain  in effect (meaning this test can be used) for the duration of the COVID-19 declaration under Section 564(b)(1) of the Act, 21 U.S.C.section 360bbb-3(b)(1), unless the authorization is terminated  or revoked sooner.       Influenza A by PCR NEGATIVE NEGATIVE Final   Influenza B by PCR NEGATIVE NEGATIVE Final    Comment: (NOTE) The Xpert Xpress SARS-CoV-2/FLU/RSV plus assay is intended as an aid in the diagnosis of influenza from Nasopharyngeal swab specimens and should not be used as a sole basis for treatment. Nasal washings and aspirates are unacceptable for Xpert Xpress SARS-CoV-2/FLU/RSV testing.  Fact Sheet for Patients: BloggerCourse.com  Fact Sheet for Healthcare Providers: SeriousBroker.it  This test is not yet approved or cleared by the [place] FDA and has been authorized for detection and/or diagnosis of SARS-CoV-2 by FDA under an Emergency Use Authorization (EUA). This EUA will remain in effect (meaning this test can be used) for the duration of the COVID-19 declaration under Section 564(b)(1) of the Act,  21 U.S.C. section 360bbb-3(b)(1), unless the authorization is terminated or revoked.     Resp Syncytial Virus by PCR NEGATIVE NEGATIVE Final    Comment: (NOTE) Fact Sheet for Patients: BloggerCourse.com  Fact Sheet for Healthcare Providers: SeriousBroker.it  This test is not yet approved or cleared by the [place] FDA and has been authorized for detection and/or diagnosis of SARS-CoV-2 by FDA under an Emergency Use Authorization (EUA). This EUA will remain in effect (meaning this test can be used) for the duration of the COVID-19 declaration under Section 564(b)(1) of the Act, 21 U.S.C. section 360bbb-3(b)(1), unless the authorization is terminated or revoked.  Performed at [facility], [address] [place], [place] [address]   Blood Culture (routine x 2)     Status: None (Preliminary result)   Collection Time: 07/02/23 12:06 PM   Specimen: BLOOD  Result Value Ref  Range Status   Specimen Description BLOOD BLOOD RIGHT ARM  Final   Special Requests   Final    BOTTLES DRAWN AEROBIC AND ANAEROBIC Blood Culture adequate volume   Culture   Final    NO GROWTH 2 DAYS Performed at [facility], [address] [place], [place] [address]    Report Status PENDING  Incomplete  Blood Culture (routine x 2)     Status: None (Preliminary result)   Collection Time: 07/02/23 12:08 PM   Specimen: BLOOD LEFT ARM  Result Value Ref Range Status   Specimen Description BLOOD LEFT ARM  Final   Special Requests   Final    BOTTLES DRAWN AEROBIC AND ANAEROBIC Blood Culture adequate volume   Culture   Final    NO GROWTH 2 DAYS Performed at [facility], [address] [place], [place] [address]    Report Status PENDING  Incomplete    Labs: CBC: Recent Labs  Lab 07/02/23 1206 07/03/23 0331  WBC 9.3 6.8  HGB 11.3* 9.9*  HCT 35.8* 30.9*  MCV 100.0 98.7  PLT 234 219   Basic Metabolic Panel: Recent  Labs  Lab 07/02/23 1206 07/03/23 0331 07/04/23 0317  NA 134* 139  --   K 3.7 3.8  --   CL 94* 101  --   CO2 25 28  --   GLUCOSE 80 99  --   BUN 13 13  --   CREATININE 1.63* 1.20 1.18  CALCIUM 8.6* 8.1*  --    Liver Function Tests: Recent Labs  Lab 07/02/23 1206  AST 36  ALT 123*  ALKPHOS 111  BILITOT 0.4  PROT 7.3  ALBUMIN 3.0*   CBG: Recent Labs  Lab 07/03/23 0818 07/03/23 0923 07/03/23 1057 07/03/23 2007 07/04/23 0719  GLUCAP 62* 90 92 110* 92    Discharge time spent: greater than 30 minutes.  This record has been created using Conservation officer, historic buildings. Errors have been sought and corrected,but may not always be located. Such creation errors do not reflect on the standard of care.   Signed: Arnetha Courser, MD [facility] 07/04/2023

## 2023-07-04 NOTE — Care Management Important Message (Signed)
Important Message  Patient Details  Name: Christian Fields MRN: [id number] Date of Birth: July 02, 1956   Medicare Important Message Given:  N/A - LOS <3 / Initial given by admissions     Olegario Messier A Carleen Rhue 07/04/2023, 10:20 AM

## 2023-07-10 ENCOUNTER — Encounter (HOSPITAL_COMMUNITY): Payer: Self-pay

## 2023-07-10 ENCOUNTER — Encounter: Payer: Self-pay | Admitting: Family Medicine

## 2023-07-10 ENCOUNTER — Inpatient Hospital Stay
Admission: AD | Admit: 2023-07-10 | Discharge: 2023-07-14 | DRG: 623 | Disposition: A | Discharge status: Home / Self Care | Payer: 59 | Source: Ambulatory Visit | Attending: Internal Medicine | Admitting: Internal Medicine

## 2023-07-10 ENCOUNTER — Other Ambulatory Visit: Payer: Self-pay

## 2023-07-10 DIAGNOSIS — M86172 Other acute osteomyelitis, left ankle and foot: Secondary | ICD-10-CM | POA: Diagnosis present

## 2023-07-10 DIAGNOSIS — Z7984 Long term (current) use of oral hypoglycemic drugs: Secondary | ICD-10-CM

## 2023-07-10 DIAGNOSIS — Z91030 Bee allergy status: Secondary | ICD-10-CM

## 2023-07-10 DIAGNOSIS — Z85828 Personal history of other malignant neoplasm of skin: Secondary | ICD-10-CM

## 2023-07-10 DIAGNOSIS — I5032 Chronic diastolic (congestive) heart failure: Secondary | ICD-10-CM | POA: Diagnosis present

## 2023-07-10 DIAGNOSIS — E11628 Type 2 diabetes mellitus with other skin complications: Secondary | ICD-10-CM | POA: Diagnosis present

## 2023-07-10 DIAGNOSIS — L03116 Cellulitis of left lower limb: Secondary | ICD-10-CM | POA: Diagnosis present

## 2023-07-10 DIAGNOSIS — N182 Chronic kidney disease, stage 2 (mild): Secondary | ICD-10-CM | POA: Diagnosis present

## 2023-07-10 DIAGNOSIS — E1140 Type 2 diabetes mellitus with diabetic neuropathy, unspecified: Secondary | ICD-10-CM | POA: Diagnosis not present

## 2023-07-10 DIAGNOSIS — L97529 Non-pressure chronic ulcer of other part of left foot with unspecified severity: Secondary | ICD-10-CM | POA: Diagnosis not present

## 2023-07-10 DIAGNOSIS — F1911 Other psychoactive substance abuse, in remission: Secondary | ICD-10-CM | POA: Diagnosis present

## 2023-07-10 DIAGNOSIS — F419 Anxiety disorder, unspecified: Secondary | ICD-10-CM | POA: Diagnosis present

## 2023-07-10 DIAGNOSIS — Z79899 Other long term (current) drug therapy: Secondary | ICD-10-CM

## 2023-07-10 DIAGNOSIS — Z8249 Family history of ischemic heart disease and other diseases of the circulatory system: Secondary | ICD-10-CM

## 2023-07-10 DIAGNOSIS — Z8739 Personal history of other diseases of the musculoskeletal system and connective tissue: Secondary | ICD-10-CM | POA: Diagnosis present

## 2023-07-10 DIAGNOSIS — Z87442 Personal history of urinary calculi: Secondary | ICD-10-CM

## 2023-07-10 DIAGNOSIS — Z1624 Resistance to multiple antibiotics: Secondary | ICD-10-CM | POA: Diagnosis present

## 2023-07-10 DIAGNOSIS — J9611 Chronic respiratory failure with hypoxia: Secondary | ICD-10-CM | POA: Diagnosis present

## 2023-07-10 DIAGNOSIS — E785 Hyperlipidemia, unspecified: Secondary | ICD-10-CM | POA: Diagnosis present

## 2023-07-10 DIAGNOSIS — Z9981 Dependence on supplemental oxygen: Secondary | ICD-10-CM

## 2023-07-10 DIAGNOSIS — E1122 Type 2 diabetes mellitus with diabetic chronic kidney disease: Secondary | ICD-10-CM | POA: Diagnosis present

## 2023-07-10 DIAGNOSIS — K219 Gastro-esophageal reflux disease without esophagitis: Secondary | ICD-10-CM | POA: Diagnosis present

## 2023-07-10 DIAGNOSIS — Z8701 Personal history of pneumonia (recurrent): Secondary | ICD-10-CM

## 2023-07-10 DIAGNOSIS — B957 Other staphylococcus as the cause of diseases classified elsewhere: Secondary | ICD-10-CM | POA: Diagnosis present

## 2023-07-10 DIAGNOSIS — I959 Hypotension, unspecified: Secondary | ICD-10-CM | POA: Diagnosis present

## 2023-07-10 DIAGNOSIS — E11622 Type 2 diabetes mellitus with other skin ulcer: Secondary | ICD-10-CM

## 2023-07-10 DIAGNOSIS — E11621 Type 2 diabetes mellitus with foot ulcer: Secondary | ICD-10-CM | POA: Diagnosis present

## 2023-07-10 DIAGNOSIS — Z7982 Long term (current) use of aspirin: Secondary | ICD-10-CM

## 2023-07-10 DIAGNOSIS — L97429 Non-pressure chronic ulcer of left heel and midfoot with unspecified severity: Secondary | ICD-10-CM | POA: Diagnosis present

## 2023-07-10 DIAGNOSIS — G894 Chronic pain syndrome: Secondary | ICD-10-CM | POA: Diagnosis present

## 2023-07-10 DIAGNOSIS — I130 Hypertensive heart and chronic kidney disease with heart failure and stage 1 through stage 4 chronic kidney disease, or unspecified chronic kidney disease: Secondary | ICD-10-CM | POA: Diagnosis present

## 2023-07-10 DIAGNOSIS — F32A Depression, unspecified: Secondary | ICD-10-CM | POA: Diagnosis present

## 2023-07-10 DIAGNOSIS — L089 Local infection of the skin and subcutaneous tissue, unspecified: Secondary | ICD-10-CM

## 2023-07-10 DIAGNOSIS — M7732 Calcaneal spur, left foot: Secondary | ICD-10-CM | POA: Diagnosis present

## 2023-07-10 DIAGNOSIS — Z87892 Personal history of anaphylaxis: Secondary | ICD-10-CM

## 2023-07-10 DIAGNOSIS — E1169 Type 2 diabetes mellitus with other specified complication: Secondary | ICD-10-CM | POA: Diagnosis present

## 2023-07-10 DIAGNOSIS — Z7951 Long term (current) use of inhaled steroids: Secondary | ICD-10-CM

## 2023-07-10 DIAGNOSIS — G4733 Obstructive sleep apnea (adult) (pediatric): Secondary | ICD-10-CM | POA: Diagnosis present

## 2023-07-10 DIAGNOSIS — Z79891 Long term (current) use of opiate analgesic: Secondary | ICD-10-CM

## 2023-07-10 DIAGNOSIS — E1151 Type 2 diabetes mellitus with diabetic peripheral angiopathy without gangrene: Secondary | ICD-10-CM | POA: Diagnosis present

## 2023-07-10 DIAGNOSIS — Z85118 Personal history of other malignant neoplasm of bronchus and lung: Secondary | ICD-10-CM

## 2023-07-10 DIAGNOSIS — Z86718 Personal history of other venous thrombosis and embolism: Secondary | ICD-10-CM

## 2023-07-10 DIAGNOSIS — E1142 Type 2 diabetes mellitus with diabetic polyneuropathy: Secondary | ICD-10-CM | POA: Diagnosis present

## 2023-07-10 DIAGNOSIS — Z7902 Long term (current) use of antithrombotics/antiplatelets: Secondary | ICD-10-CM

## 2023-07-10 DIAGNOSIS — J4489 Other specified chronic obstructive pulmonary disease: Secondary | ICD-10-CM | POA: Diagnosis present

## 2023-07-10 DIAGNOSIS — F17210 Nicotine dependence, cigarettes, uncomplicated: Secondary | ICD-10-CM | POA: Diagnosis present

## 2023-07-10 DIAGNOSIS — Z8673 Personal history of transient ischemic attack (TIA), and cerebral infarction without residual deficits: Secondary | ICD-10-CM

## 2023-07-10 LAB — CBC WITH DIFFERENTIAL/PLATELET
Abs Immature Granulocytes: 0.08 10*3/uL — ABNORMAL HIGH (ref 0.00–0.07)
Basophils Absolute: 0.1 10*3/uL (ref 0.0–0.1)
Basophils Relative: 1 %
Eosinophils Absolute: 0.4 10*3/uL (ref 0.0–0.5)
Eosinophils Relative: 3 %
HCT: 38.1 % — ABNORMAL LOW (ref 39.0–52.0)
Hemoglobin: 11.9 g/dL — ABNORMAL LOW (ref 13.0–17.0)
Immature Granulocytes: 1 %
Lymphocytes Relative: 25 %
Lymphs Abs: 3.2 10*3/uL (ref 0.7–4.0)
MCH: 30.9 pg (ref 26.0–34.0)
MCHC: 31.2 g/dL (ref 30.0–36.0)
MCV: 99 fL (ref 80.0–100.0)
Monocytes Absolute: 0.6 10*3/uL (ref 0.1–1.0)
Monocytes Relative: 5 %
Neutro Abs: 8.4 10*3/uL — ABNORMAL HIGH (ref 1.7–7.7)
Neutrophils Relative %: 65 %
Platelets: 360 10*3/uL (ref 150–400)
RBC: 3.85 MIL/uL — ABNORMAL LOW (ref 4.22–5.81)
RDW: 14.9 % (ref 11.5–15.5)
WBC: 12.8 10*3/uL — ABNORMAL HIGH (ref 4.0–10.5)
nRBC: 0 % (ref 0.0–0.2)

## 2023-07-10 LAB — COMPREHENSIVE METABOLIC PANEL
ALT: 16 U/L (ref 0–44)
AST: 20 U/L (ref 15–41)
Albumin: 3.4 g/dL — ABNORMAL LOW (ref 3.5–5.0)
Alkaline Phosphatase: 107 U/L (ref 38–126)
Anion gap: 12 (ref 5–15)
BUN: 27 mg/dL — ABNORMAL HIGH (ref 8–23)
CO2: 28 mmol/L (ref 22–32)
Calcium: 9.2 mg/dL (ref 8.9–10.3)
Chloride: 96 mmol/L — ABNORMAL LOW (ref 98–111)
Creatinine, Ser: 1.25 mg/dL — ABNORMAL HIGH (ref 0.61–1.24)
GFR, Estimated: >60 mL/min (ref >60)
Glucose, Bld: 106 mg/dL — ABNORMAL HIGH (ref 70–99)
Potassium: 3.6 mmol/L (ref 3.5–5.1)
Sodium: 136 mmol/L (ref 135–145)
Total Bilirubin: 0.3 mg/dL (ref 0.3–1.2)
Total Protein: 7.9 g/dL (ref 6.5–8.1)

## 2023-07-10 LAB — AEROBIC CULTURE W GRAM STAIN (SUPERFICIAL SPECIMEN)

## 2023-07-10 LAB — GLUCOSE, CAPILLARY
Glucose-Capillary: 103 mg/dL — ABNORMAL HIGH (ref 70–99)
Glucose-Capillary: 115 mg/dL — ABNORMAL HIGH (ref 70–99)

## 2023-07-10 MED ORDER — ACETAMINOPHEN 325 MG PO TABS: 650.0000 mg | ORAL_TABLET | Freq: Four times a day (QID) | ORAL | Status: DC | PRN

## 2023-07-10 MED ORDER — INSULIN ASPART 100 UNIT/ML IJ SOLN: 0.0000 [IU] | Freq: Three times a day (TID) | INTRAMUSCULAR | Status: DC

## 2023-07-10 MED ORDER — VANCOMYCIN HCL 1500 MG/300ML IV SOLN
1500.0000 mg | Freq: Once | INTRAVENOUS | Status: AC
  Administered 2023-07-10: 1500 mg via INTRAVENOUS
  Filled 2023-07-10: qty 300

## 2023-07-10 MED ORDER — VANCOMYCIN HCL 1250 MG/250ML IV SOLN
1250.0000 mg | INTRAVENOUS | Status: DC
  Administered 2023-07-11 – 2023-07-12 (×2): 1250 mg via INTRAVENOUS
  Filled 2023-07-10 (×2): qty 250

## 2023-07-10 MED ORDER — POVIDONE-IODINE 10 % EX SWAB: 2.0000 | Freq: Once | CUTANEOUS | Status: DC

## 2023-07-10 MED ORDER — INSULIN ASPART 100 UNIT/ML IJ SOLN: 0.0000 [IU] | Freq: Every day | INTRAMUSCULAR | Status: DC

## 2023-07-10 MED ORDER — BUPRENORPHINE HCL-NALOXONE HCL 8-2 MG SL SUBL: 1.0000 | SUBLINGUAL_TABLET | Freq: Two times a day (BID) | SUBLINGUAL | Status: DC

## 2023-07-10 MED ORDER — METRONIDAZOLE 500 MG/100ML IV SOLN
500.0000 mg | Freq: Two times a day (BID) | INTRAVENOUS | Status: DC
  Administered 2023-07-10 – 2023-07-14 (×7): 500 mg via INTRAVENOUS
  Filled 2023-07-10 (×9): qty 100

## 2023-07-10 MED ORDER — PANTOPRAZOLE SODIUM 40 MG PO TBEC
40.0000 mg | DELAYED_RELEASE_TABLET | Freq: Every day | ORAL | Status: DC
  Administered 2023-07-12 – 2023-07-14 (×3): 40 mg via ORAL
  Filled 2023-07-10 (×4): qty 1

## 2023-07-10 MED ORDER — BUPRENORPHINE HCL-NALOXONE HCL 8-2 MG SL SUBL
1.0000 | SUBLINGUAL_TABLET | Freq: Three times a day (TID) | SUBLINGUAL | Status: DC
  Administered 2023-07-10 – 2023-07-14 (×11): 1 via SUBLINGUAL
  Filled 2023-07-10 (×11): qty 1

## 2023-07-10 MED ORDER — SIMVASTATIN 20 MG PO TABS
40.0000 mg | ORAL_TABLET | Freq: Every day | ORAL | Status: DC
  Administered 2023-07-11 – 2023-07-14 (×4): 40 mg via ORAL
  Filled 2023-07-10 (×5): qty 2

## 2023-07-10 MED ORDER — MORPHINE SULFATE (PF) 2 MG/ML IV SOLN: 2.0000 mg | INTRAVENOUS | Status: DC | PRN

## 2023-07-10 MED ORDER — ALBUTEROL SULFATE (2.5 MG/3ML) 0.083% IN NEBU: 2.5000 mg | INHALATION_SOLUTION | RESPIRATORY_TRACT | Status: DC | PRN

## 2023-07-10 MED ORDER — SODIUM CHLORIDE 0.9 % IV SOLN
2.0000 g | Freq: Two times a day (BID) | INTRAVENOUS | Status: DC
  Administered 2023-07-10 – 2023-07-12 (×4): 2 g via INTRAVENOUS
  Filled 2023-07-10 (×4): qty 12.5

## 2023-07-10 MED ORDER — BUSPIRONE HCL 15 MG PO TABS
7.5000 mg | ORAL_TABLET | Freq: Two times a day (BID) | ORAL | Status: DC
  Administered 2023-07-10 – 2023-07-14 (×7): 7.5 mg via ORAL
  Filled 2023-07-10 (×9): qty 1

## 2023-07-10 MED ORDER — ASPIRIN 81 MG PO TBEC
81.0000 mg | DELAYED_RELEASE_TABLET | Freq: Every day | ORAL | Status: DC
  Administered 2023-07-12 – 2023-07-14 (×3): 81 mg via ORAL
  Filled 2023-07-10 (×4): qty 1

## 2023-07-10 MED ORDER — CHLORHEXIDINE GLUCONATE 4 % EX SOLN
60.0000 mL | Freq: Once | CUTANEOUS | Status: AC
  Administered 2023-07-11: 4 via TOPICAL

## 2023-07-10 MED ORDER — DULOXETINE HCL 30 MG PO CPEP
30.0000 mg | ORAL_CAPSULE | Freq: Every day | ORAL | Status: DC
  Administered 2023-07-12 – 2023-07-14 (×3): 30 mg via ORAL
  Filled 2023-07-10 (×3): qty 1

## 2023-07-10 MED ORDER — GABAPENTIN 400 MG PO CAPS
800.0000 mg | ORAL_CAPSULE | Freq: Four times a day (QID) | ORAL | Status: DC
  Administered 2023-07-10 – 2023-07-12 (×6): 800 mg via ORAL
  Filled 2023-07-10 (×6): qty 2

## 2023-07-10 NOTE — Consult Note (Signed)
ORTHOPAEDIC CONSULTATION  REQUESTING PHYSICIAN: Floydene Flock, MD  Chief Complaint: Left heel osteomyelitis  HPI: Christian Fields is a 67 y.o. male who complains of left heel osteomyelitis.  He is known to me.  He was recently hospitalized with cellulitis to his left foot and heel.  He had revascularization performed last week.  Patient deferred surgery at that time.  Came to the outpatient clinic this week with continued open ulceration.  MRI is consistent with osteomyelitis.  He understands he will need debridement and he has consented to admission to the hospital for planned debridement after admission.  Denies any fever or chills.  He is on oxygen.  He has longstanding history of severe COPD.  Severe diabetes with neuropathy as well.  Past Medical History:  Diagnosis Date   Anemia    Anxiety    Arthritis    Asthma    Cancer (HCC)    Basal Cell Skin Cancer   Chronic back pain    COPD (chronic obstructive pulmonary disease) (HCC)    Depression    Diabetes mellitus (HCC)    Dyspnea    GERD (gastroesophageal reflux disease)    Gout    Gout    Headache    History of blood clots    Left Leg--July 2018   History of kidney stones    Hyperlipidemia    Hyperlipidemia    Hypertension    Kidney stones    Neuropathy    On home oxygen therapy    2 L / M   Pneumonia 06/2017   Sleep apnea    Ulcer of foot (HCC)    Right   Past Surgical History:  Procedure Laterality Date   APPENDECTOMY     DG FEET 2 VIEWS BILAT     LIPOMA EXCISION Right 08/15/2017   Procedure: EXCISION TUMOR(CYST) FOOT;  Surgeon: Recardo Evangelist, DPM;  Location: [facility] ORS;  Service: Podiatry;  Laterality: Right;   LOWER EXTREMITY ANGIOGRAPHY Left 07/03/2023   Procedure: Lower Extremity Angiography;  Surgeon: Annice Needy, MD;  Location: [facility] INVASIVE CV LAB;  Service: Cardiovascular;  Laterality: Left;   OTHER SURGICAL HISTORY Bilateral Foot surgery   Social History   Socioeconomic History   Marital status:  Widowed    Spouse name: Not on file   Number of children: Not on file   Years of education: Not on file   Highest education level: Not on file  Occupational History   Not on file  Tobacco Use   Smoking status: Former    Current packs/day: 0.50    Average packs/day: 0.5 packs/day for 6.6 years (3.3 ttl pk-yrs)    Types: Cigarettes    Start date: 2018   Smokeless tobacco: Never  Vaping Use   Vaping status: Never Used  Substance and Sexual Activity   Alcohol use: Yes    Alcohol/week: 1.0 standard drink of alcohol    Types: 1 Cans of beer per week    Comment: occ   Drug use: No   Sexual activity: Not Currently  Other Topics Concern   Not on file  Social History Narrative   Not on file   Social Determinants of Health   Financial Resource Strain: Low Risk  (04/19/2023)   Received from [facility], [facility]   Overall Financial Resource Strain (CARDIA)    Difficulty of Paying Living Expenses: Not hard at all  Food Insecurity: No Food Insecurity (07/10/2023)   Hunger Vital Sign  Worried About Programme researcher, broadcasting/film/video in the Last Year: Never true    Ran Out of Food in the Last Year: Never true  Transportation Needs: No Transportation Needs (07/02/2023)   PRAPARE - Administrator, Civil Service (Medical): No    Lack of Transportation (Non-Medical): No  Physical Activity: Not on file  Stress: Not on file  Social Connections: Not on file   Family History  Problem Relation Age of Onset   Hypertension Mother    Allergies  Allergen Reactions   Bee Venom Anaphylaxis   Prior to Admission medications   Medication Sig Start Date End Date Taking? Authorizing Provider  acetaminophen (TYLENOL) 325 MG tablet Take 650 mg by mouth every 6 (six) hours as needed.    [provider]  albuterol (PROVENTIL) (2.5 MG/3ML) 0.083% nebulizer solution Take 2.5 mg by nebulization every 4 (four) hours as needed. 05/29/23   [provider]  albuterol (VENTOLIN HFA)  108 (90 Base) MCG/ACT inhaler Inhale 2 puffs into the lungs every 4 (four) hours as needed for wheezing or shortness of breath.    [provider]  allopurinol (ZYLOPRIM) 300 MG tablet Take 600 mg by mouth daily. 02/24/20   [provider]  amoxicillin-clavulanate (AUGMENTIN) 875-125 MG tablet Take 1 tablet by mouth 2 (two) times daily for 10 days. 07/04/23 07/14/23  Arnetha Courser, MD  aspirin EC 81 MG tablet Take 1 tablet by mouth daily.    [provider]  azelastine (ASTELIN) 0.1 % nasal spray Place 2 sprays into both nostrils 2 (two) times daily. 05/27/23   [provider]  buprenorphine-naloxone (SUBOXONE) 8-2 mg SUBL SL tablet Place 1 tablet under the tongue in the morning, at noon, and at bedtime.    [provider]  busPIRone (BUSPAR) 7.5 MG tablet Take 7.5 mg by mouth 2 (two) times daily. 04/30/23 04/29/24  [provider]  cholecalciferol (VITAMIN D) 1000 units tablet Take 1,000 Units by mouth daily. Patient not taking: Reported on 10/25/2022    [provider]  clopidogrel (PLAVIX) 75 MG tablet Take 1 tablet (75 mg total) by mouth daily. 02/02/21   Willeen Niece, MD  cyanocobalamin (VITAMIN B12) 1000 MCG/ML injection Inject 1 mL (1,000 mcg total) into the muscle every 30 (thirty) days. Patient not taking: Reported on 07/02/2023 07/12/22   Earna Coder, MD  doxycycline (VIBRA-TABS) 100 MG tablet Take 1 tablet (100 mg total) by mouth 2 (two) times daily for 10 days. 07/04/23 07/14/23  Arnetha Courser, MD  DULoxetine (CYMBALTA) 30 MG capsule Take 1 capsule by mouth daily. 01/17/16   [provider]  ezetimibe (ZETIA) 10 MG tablet Take 10 mg by mouth daily. 02/24/20   [provider]  FARXIGA 10 MG TABS tablet Take 10 mg by mouth daily. 09/14/20   [provider]  Fluticasone-Umeclidin-Vilant (TRELEGY ELLIPTA) 100-62.5-25 MCG/ACT AEPB Inhale into the lungs. 09/12/21   [provider]  gabapentin  (NEURONTIN) 800 MG tablet Take 800 mg by mouth 4 (four) times daily. 07/14/21   [provider]  hydrochlorothiazide (HYDRODIURIL) 12.5 MG tablet Take 12.5 mg by mouth daily. 05/29/23   [provider]  metFORMIN (GLUCOPHAGE) 1000 MG tablet Take 1,000 mg by mouth 2 (two) times daily with a meal.    [provider]  metoprolol succinate (TOPROL-XL) 50 MG 24 hr tablet Take 50 mg by mouth daily. 02/24/20   [provider]  OXYGEN Inhale 2 L into the lungs.  [provider]  pantoprazole (PROTONIX) 40 MG tablet Take 40 mg by mouth daily. 06/20/21   [provider]  roflumilast (DALIRESP) 500 MCG TABS tablet Take 500 mcg by mouth daily. 08/15/21   [provider]  simvastatin (ZOCOR) 40 MG tablet Take 1 tablet by mouth daily. 01/17/16   [provider]  Syringe/Needle, Disp, (SYRINGE 3CC/25GX1") 25G X 1" 3 ML MISC 1 Syringe by Does not apply route every 30 (thirty) days. 07/12/22   Earna Coder, MD   No results found.  Positive ROS: All other systems have been reviewed and were otherwise negative with the exception of those mentioned in the HPI and as above.  12 point ROS was performed.  Physical Exam: General: Alert and oriented.  No apparent distress.  Vascular:  Left foot:Dorsalis Pedis:  diminished Posterior Tibial:  diminished  Right foot: Dorsalis Pedis:  diminished Posterior Tibial:  diminished  Neuro:absent protective sensation  Derm: On the plantar aspect of the left heel is an approximately 2 cm open ulceration that probes to the medial aspect of the calcaneus with a small wound to the medial aspect of the heel.  It does probe to the calcaneus at this time as well.  No active purulent drainage.  Mild erythema along the plantar heel with a granular tissue.  Ortho/MS: He walks with a calcaneal gait.  Prominence to the plantar calcaneus.  Assessment: Osteomyelitis left calcaneus Diabetes with neuropathy Severe  COPD on oxygen  Plan: I discussed with the patient he will need to undergo surgical intervention with debridement of the calcaneus.  Will plan on surgery tomorrow morning.  We discussed the risks and benefits associated with surgery.  He has given consent.  I will consult infectious disease for assistance with long-term IV antibiotics as I suspect he will need long-term IV antibiotics after surgery.    Irean Hong, DPM Cell [phone]   07/10/2023 5:40 PM

## 2023-07-10 NOTE — Progress Notes (Signed)
       CROSS COVER NOTE  NAME: Christian Fields MRN: [id number] DOB : 28-Nov-1956    Concern as stated by nurse / staff   Message received from Laverta Baltimore RN via secure chat Pt asking about getting gabapentin and suboxone which I have updated on Med list Pt admitted today for L ankle osteomyelitis and will be going for I&D tomorrow     Pertinent findings on chart review: H & P reviewed Home meds reviewed Conferred with admitting MD Dr Criss Alvine via secure chat  Assessment and  Interventions   Assessment:  Plan: Resumed home dose suboxone and gabapentin per MD direction Prn morphine discontinued      Donnie Mesa NP [facility] Cover 7pm-7am - check amion for availability Pager [phone]

## 2023-07-10 NOTE — Consult Note (Signed)
NAME: Christian Fields  DOB: 22-Sep-1956  MRN: [id number]  Date/Time: 07/10/2023 8:05 PM  REQUESTING PROVIDER: Dr. Ether Griffins Subjective:  REASON FOR CONSULT: Left foot infection ? Christian Fields is a 67 y.o. with a history of COPD, HTN< HLD, DM, Chronic left foot ulcer, Non small cell ca, Polysusbatnce use, CKD was recently in the hospital 7/24-7/26 for the left foot ulcer, for left leg pain, he also had low BP. He had angioplasty of LT SFA, and stent placement. HE wanted to follow up with his podiatrist and was discharged on augmentin and doxy. 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 Vitals today  07/10/23  BP 107/66  Temp 98 F (36.7 C)  Pulse Rate 83  Resp 20  SpO2 95 %     Latest Reference Range & Units 07/10/23  WBC 4.0 - 10.5 K/uL 12.8 (H)  Hemoglobin 13.0 - 17.0 g/dL 82.9 (L)  HCT 56.2 - 13.0 % 38.1 (L)  Platelets 150 - 400 K/uL 360  Creatinine 0.61 - 1.24 mg/dL 8.65 (H)   He is currently on vanco , cefepime and flagyl Debridement is being planned tomorrow I am asked to se ethe patient for management of the wounds  Past Medical History:  Diagnosis Date   Anemia    Anxiety    Arthritis    Asthma    Cancer (HCC)    Basal Cell Skin Cancer   Chronic back pain    COPD (chronic obstructive pulmonary disease) (HCC)    Depression    Diabetes mellitus (HCC)    Dyspnea    GERD (gastroesophageal reflux disease)    Gout    Gout    Headache    History of blood clots    Left Leg--July 2018   History of kidney stones    Hyperlipidemia    Hyperlipidemia    Hypertension    Kidney stones    Neuropathy    On home oxygen therapy    2 L / M    Pneumonia 06/2017   Sleep apnea    Ulcer of foot (HCC)    Right    Past Surgical History:  Procedure Laterality Date   APPENDECTOMY     DG FEET 2 VIEWS BILAT     LIPOMA EXCISION Right 08/15/2017   Procedure: EXCISION TUMOR(CYST) FOOT;  Surgeon: Recardo Evangelist, DPM;  Location: [facility] ORS;  Service: Podiatry;  Laterality: Right;   LOWER EXTREMITY ANGIOGRAPHY Left 07/03/2023   Procedure: Lower Extremity Angiography;  Surgeon: Annice Needy, MD;  Location: [facility] INVASIVE CV LAB;  Service: Cardiovascular;  Laterality: Left;   OTHER SURGICAL HISTORY Bilateral Foot surgery    Social History   Socioeconomic History   Marital status: Widowed    Spouse name: Not on file   Number of children: Not on file   Years of education: Not on file   Highest education level: Not on file  Occupational History   Not on file  Tobacco Use   Smoking status: Former    Current packs/day: 0.50    Average packs/day: 0.5 packs/day for 6.6 years (3.3 ttl pk-yrs)  Types: Cigarettes    Start date: 2018   Smokeless tobacco: Never  Vaping Use   Vaping status: Never Used  Substance and Sexual Activity   Alcohol use: Yes    Alcohol/week: 1.0 standard drink of alcohol    Types: 1 Cans of beer per week    Comment: occ   Drug use: No   Sexual activity: Not Currently  Other Topics Concern   Not on file  Social History Narrative   Not on file   Social Determinants of Health   Financial Resource Strain: Low Risk  (04/19/2023)   Received from [facility], [facility]   Overall Financial Resource Strain (CARDIA)    Difficulty of Paying Living Expenses: Not hard at all  Food Insecurity: No Food Insecurity (07/10/2023)   Hunger Vital Sign    Worried About Running Out of Food in the Last Year: Never true    Ran Out of Food in the Last Year: Never true  Transportation Needs: No Transportation Needs (07/10/2023)   PRAPARE - Administrator, Civil Service (Medical): No    Lack of Transportation  (Non-Medical): No  Physical Activity: Not on file  Stress: Not on file  Social Connections: Not on file  Intimate Partner Violence: Not At Risk (07/10/2023)   Humiliation, Afraid, Rape, and Kick questionnaire    Fear of Current or Ex-Partner: No    Emotionally Abused: No    Physically Abused: No    Sexually Abused: No    Family History  Problem Relation Age of Onset   Hypertension Mother    Allergies  Allergen Reactions   Bee Venom Anaphylaxis   I? Current Facility-Administered Medications  Medication Dose Route Frequency Provider Last Rate Last Admin   acetaminophen (TYLENOL) tablet 650 mg  650 mg Oral Q6H PRN Loyce Dys, MD       albuterol (PROVENTIL) (2.5 MG/3ML) 0.083% nebulizer solution 2.5 mg  2.5 mg Nebulization Q4H PRN Loyce Dys, MD       aspirin EC tablet 81 mg  81 mg Oral Daily Djan, Scarlette Calico, MD       busPIRone (BUSPAR) tablet 7.5 mg  7.5 mg Oral BID Rosezetta Schlatter T, MD       ceFEPIme (MAXIPIME) 2 g in sodium chloride 0.9 % 100 mL IVPB  2 g Intravenous Q12H Otelia Sergeant, RPH 200 mL/hr at 07/10/23 1934 2 g at 07/10/23 1934   [START ON 07/11/2023] DULoxetine (CYMBALTA) DR capsule 30 mg  30 mg Oral Daily Djan, Scarlette Calico, MD       insulin aspart (novoLOG) injection 0-5 Units  0-5 Units Subcutaneous QHS Loyce Dys, MD       [START ON 07/11/2023] insulin aspart (novoLOG) injection 0-6 Units  0-6 Units Subcutaneous TID WC Loyce Dys, MD       metroNIDAZOLE (FLAGYL) IVPB 500 mg  500 mg Intravenous Q12H Rosezetta Schlatter T, MD       morphine (PF) 2 MG/ML injection 2 mg  2 mg Intravenous Q4H PRN Loyce Dys, MD       pantoprazole (PROTONIX) EC tablet 40 mg  40 mg Oral Daily Djan, Criss Alvine T, MD       simvastatin (ZOCOR) tablet 40 mg  40 mg Oral QPC supper Rosezetta Schlatter T, MD       vancomycin (VANCOREADY) IVPB 1500 mg/300 mL  1,500 mg Intravenous Once Loyce Dys, MD       Followed by   [  START ON 07/11/2023] vancomycin (VANCOREADY) IVPB 1250 mg/250 mL  1,250 mg Intravenous  Q24H Loyce Dys, MD         Abtx:  Anti-infectives (From admission, onward)    Start     Dose/Rate Route Frequency Ordered Stop   07/11/23 2000  vancomycin (VANCOREADY) IVPB 1250 mg/250 mL       Placed in "Followed by" Linked Group   1,250 mg 166.7 mL/hr over 90 Minutes Intravenous Every 24 hours 07/10/23 1813     07/10/23 2200  metroNIDAZOLE (FLAGYL) IVPB 500 mg        500 mg 100 mL/hr over 60 Minutes Intravenous Every 12 hours 07/10/23 1752     07/10/23 2000  vancomycin (VANCOREADY) IVPB 1500 mg/300 mL       Placed in "Followed by" Linked Group   1,500 mg 150 mL/hr over 120 Minutes Intravenous  Once 07/10/23 1813     07/10/23 1900  ceFEPIme (MAXIPIME) 2 g in sodium chloride 0.9 % 100 mL IVPB        2 g 200 mL/hr over 30 Minutes Intravenous Every 12 hours 07/10/23 1810         REVIEW OF SYSTEMS:  Const: negative fever, negative chills, negative weight loss Eyes: negative diplopia or visual changes, negative eye pain ENT: negative coryza, negative sore throat Resp: negative cough, hemoptysis, dyspnea Cards: negative for chest pain, palpitations, lower extremity edema GU: negative for frequency, dysuria and hematuria GI: Negative for abdominal pain, diarrhea, bleeding, constipation Skin: negative for rash and pruritus Heme: negative for easy bruising and gum/nose bleeding MS:  weakness Neurolo:negative for headaches, dizziness, vertigo, memory problems  Psych: negative for feelings of anxiety, depression  Endocrine: diabetes Allergy/Immunology- negative for any medication or food allergies ?  Objective:  VITALS:  BP 107/66   Pulse 83   Temp 98 F (36.7 C) (Oral)   Resp 20   Ht 5\' 5"  (1.651 m)   Wt 61.2 kg   SpO2 95%   BMI 22.47 kg/m   PHYSICAL EXAM:  General: Alert, cooperative, no distress, appears stated age.  Head: Normocephalic, without obvious abnormality, atraumatic. Eyes: Conjunctivae clear, anicteric sclerae. Pupils are equal ENT Nares normal. No  drainage or sinus tenderness. Lips, mucosa, and tongue normal. No Thrush Neck: Supple, symmetrical, no adenopathy, thyroid: non tender no carotid bruit and no JVD. Back: No CVA tenderness. Lungs: Clear to auscultation bilaterally. No Wheezing or Rhonchi. No rales. Heart: Regular rate and rhythm, no murmur, rub or gallop. Abdomen: Soft, non-tender,not distended. Bowel sounds normal. No masses Extremities: Left foot Ulcer over the plantar surface of the mid calcaneus  Skin: No rashes or lesions. Or bruising Lymph: Cervical, supraclavicular normal. Neurologic: Grossly non-focal Pertinent Labs Lab Results CBC    Component Value Date/Time   WBC 12.8 (H) 07/10/2023 1839   RBC 3.85 (L) 07/10/2023 1839   HGB 11.9 (L) 07/10/2023 1839   HGB 13.9 05/17/2014 1424   HCT 38.1 (L) 07/10/2023 1839   HCT 44.6 03/31/2014 1815   PLT 360 07/10/2023 1839   PLT 261 03/31/2014 1815   MCV 99.0 07/10/2023 1839   MCV 96 03/31/2014 1815   MCH 30.9 07/10/2023 1839   MCHC 31.2 07/10/2023 1839   RDW 14.9 07/10/2023 1839   RDW 13.7 03/31/2014 1815   LYMPHSABS 3.2 07/10/2023 1839   LYMPHSABS 0.6 (L) 09/25/2013 0356   MONOABS 0.6 07/10/2023 1839   MONOABS 0.1 (L) 09/25/2013 0356   EOSABS 0.4 07/10/2023 1839   EOSABS 0.0  09/25/2013 0356   BASOSABS 0.1 07/10/2023 1839   BASOSABS 0.0 09/25/2013 0356       Latest Ref Rng & Units 07/10/2023    6:39 PM 07/04/2023    3:17 AM 07/03/2023    3:31 AM  CMP  Glucose 70 - 99 mg/dL 621   99   BUN 8 - 23 mg/dL 27   13   Creatinine 3.08 - 1.24 mg/dL 6.57  8.46  9.62   Sodium 135 - 145 mmol/L 136   139   Potassium 3.5 - 5.1 mmol/L 3.6   3.8   Chloride 98 - 111 mmol/L 96   101   CO2 22 - 32 mmol/L 28   28   Calcium 8.9 - 10.3 mg/dL 9.2   8.1   Total Protein 6.5 - 8.1 g/dL 7.9     Total Bilirubin 0.3 - 1.2 mg/dL 0.3     Alkaline Phos 38 - 126 U/L 107     AST 15 - 41 U/L 20     ALT 0 - 44 U/L 16         Microbiology: Recent Results (from the past 240  hour(s))  Resp panel by RT-PCR (RSV, Flu A&B, Covid) Anterior Nasal Swab     Status: None   Collection Time: 07/02/23 12:06 PM   Specimen: Anterior Nasal Swab  Result Value Ref Range Status   SARS Coronavirus 2 by RT PCR NEGATIVE NEGATIVE Final    Comment: (NOTE) SARS-CoV-2 target nucleic acids are NOT DETECTED.  The SARS-CoV-2 RNA is generally detectable in upper respiratory specimens during the acute phase of infection. The lowest concentration of SARS-CoV-2 viral copies this assay can detect is 138 copies/mL. A negative result does not preclude SARS-Cov-2 infection and should not be used as the sole basis for treatment or other patient management decisions. A negative result may occur with  improper specimen collection/handling, submission of specimen other than nasopharyngeal swab, presence of viral mutation(s) within the areas targeted by this assay, and inadequate number of viral copies(<138 copies/mL). A negative result must be combined with clinical observations, patient history, and epidemiological information. The expected result is Negative.  Fact Sheet for Patients:  BloggerCourse.com  Fact Sheet for Healthcare Providers:  SeriousBroker.it  This test is no t yet approved or cleared by the [place] FDA and  has been authorized for detection and/or diagnosis of SARS-CoV-2 by FDA under an Emergency Use Authorization (EUA). This EUA will remain  in effect (meaning this test can be used) for the duration of the COVID-19 declaration under Section 564(b)(1) of the Act, 21 U.S.C.section 360bbb-3(b)(1), unless the authorization is terminated  or revoked sooner.       Influenza A by PCR NEGATIVE NEGATIVE Final   Influenza B by PCR NEGATIVE NEGATIVE Final    Comment: (NOTE) The Xpert Xpress SARS-CoV-2/FLU/RSV plus assay is intended as an aid in the diagnosis of influenza from Nasopharyngeal swab specimens and should not  be used as a sole basis for treatment. Nasal washings and aspirates are unacceptable for Xpert Xpress SARS-CoV-2/FLU/RSV testing.  Fact Sheet for Patients: BloggerCourse.com  Fact Sheet for Healthcare Providers: SeriousBroker.it  This test is not yet approved or cleared by the [place] FDA and has been authorized for detection and/or diagnosis of SARS-CoV-2 by FDA under an Emergency Use Authorization (EUA). This EUA will remain in effect (meaning this test can be used) for the duration of the COVID-19 declaration under Section 564(b)(1) of the Act, 21 U.S.C. section  360bbb-3(b)(1), unless the authorization is terminated or revoked.     Resp Syncytial Virus by PCR NEGATIVE NEGATIVE Final    Comment: (NOTE) Fact Sheet for Patients: BloggerCourse.com  Fact Sheet for Healthcare Providers: SeriousBroker.it  This test is not yet approved or cleared by the [place] FDA and has been authorized for detection and/or diagnosis of SARS-CoV-2 by FDA under an Emergency Use Authorization (EUA). This EUA will remain in effect (meaning this test can be used) for the duration of the COVID-19 declaration under Section 564(b)(1) of the Act, 21 U.S.C. section 360bbb-3(b)(1), unless the authorization is terminated or revoked.  Performed at [facility], [address] [place], [place] [address]   Blood Culture (routine x 2)     Status: None   Collection Time: 07/02/23 12:06 PM   Specimen: BLOOD  Result Value Ref Range Status   Specimen Description BLOOD BLOOD RIGHT ARM  Final   Special Requests   Final    BOTTLES DRAWN AEROBIC AND ANAEROBIC Blood Culture adequate volume   Culture   Final    NO GROWTH 5 DAYS Performed at [facility], [address], [place], [place] [address]    Report Status 07/07/2023 FINAL  Final  Blood Culture (routine x 2)     Status: None    Collection Time: 07/02/23 12:08 PM   Specimen: BLOOD LEFT ARM  Result Value Ref Range Status   Specimen Description BLOOD LEFT ARM  Final   Special Requests   Final    BOTTLES DRAWN AEROBIC AND ANAEROBIC Blood Culture adequate volume   Culture   Final    NO GROWTH 5 DAYS Performed at [facility], [address] [place], [place] [address]    Report Status 07/07/2023 FINAL  Final    IMAGING RESULTS: 06/23/2023 MRI of the left foot and ankle shows plantar soft tissue ulceration underlying the posterior calcaneus with subcortical bone marrow edema within the plantar aspect of the posterior calcaneus.  Possible shallow cortical erosion near the plantar fascia enthesitis is site findings concerning for early osteomyelitis I have personally reviewed the films ? Impression/Recommendation Diabetes mellitus with diabetic foot infection Left foot ulcer Patient is undergoing debridement tomorrow Deep culture sent today Patient is currently on vancomycin and cefepime Concern for osteomyelitis ? PAD status post stent on the left SFA  Diabetes mellitus with diabetic neuropathy Management as per primary team___________________________________________________ Discussed with patient, requesting provider Note:  This document was prepared using Dragon voice recognition software and may include unintentional dictation errors.

## 2023-07-10 NOTE — H&P (Addendum)
History and Physical    Patient: Christian Fields ZHY:[id number] DOB: [birth year] DOA: 07/10/2023 DOS: the patient was seen and examined on 07/10/2023 PCP: Alease Medina, MD  Patient coming from: Home  Chief Complaint: Foot infection  HPI: Christian Fields is a 67 y.o. male with medical history significant of  Chronic hypoxic respiratory failure on 3 L of intranasal oxygen, COPD, hypertension, hyperlipidemia, diabetes mellitus, asthma, TIA, GERD, gout, depression, OSA not on CPAP, polysubstance abuse, chronic pain syndrome on Suboxone, non-small cell lung cancer s/p treatment, tobacco abuse, CKD stage II who was recently admitted here from July 24 to July 26 for cellulitis of left lower extremity received broad-spectrum antibiotics.  During that hospitalization podiatry had recommended surgical intervention however patient opted out, refused and decided to follow-up as an outpatient.  Patient apparently saw podiatry 2 days ago and was found to have worsening of infection with possible underlying osteomyelitis.  Surgical intervention was discussed with patient who agreed to come in today for surgical intervention tomorrow.  Patient is being seen as a direct admission from podiatry clinic. Patient denies nausea vomiting abdominal pain chest pain cough or urinary complaint.  Review of Systems: As mentioned in the history of present illness. All other systems reviewed and are negative. Past Medical History:  Diagnosis Date   Anemia    Anxiety    Arthritis    Asthma    Cancer (HCC)    Basal Cell Skin Cancer   Chronic back pain    COPD (chronic obstructive pulmonary disease) (HCC)    Depression    Diabetes mellitus (HCC)    Dyspnea    GERD (gastroesophageal reflux disease)    Gout    Gout    Headache    History of blood clots    Left Leg--July 2018   History of kidney stones    Hyperlipidemia    Hyperlipidemia    Hypertension    Kidney stones    Neuropathy    On home oxygen therapy    2  L / M   Pneumonia 06/2017   Sleep apnea    Ulcer of foot (HCC)    Right   Past Surgical History:  Procedure Laterality Date   APPENDECTOMY     DG FEET 2 VIEWS BILAT     LIPOMA EXCISION Right 08/15/2017   Procedure: EXCISION TUMOR(CYST) FOOT;  Surgeon: Recardo Evangelist, DPM;  Location: [facility] ORS;  Service: Podiatry;  Laterality: Right;   LOWER EXTREMITY ANGIOGRAPHY Left 07/03/2023   Procedure: Lower Extremity Angiography;  Surgeon: Annice Needy, MD;  Location: [facility];  Service: Cardiovascular;  Laterality: Left;   OTHER SURGICAL HISTORY Bilateral Foot surgery   Social History:  reports that he has quit smoking. His smoking use included cigarettes. He started smoking about 6 years ago. He has a 3.3 pack-year smoking history. He has never used smokeless tobacco. He reports current alcohol use of about 1.0 standard drink of alcohol per week. He reports that he does not use drugs.  Allergies  Allergen Reactions   Bee Venom Anaphylaxis    Family History  Problem Relation Age of Onset   Hypertension Mother     Prior to Admission medications   Medication Sig Start Date End Date Taking? Authorizing Provider  acetaminophen (TYLENOL) 325 MG tablet Take 650 mg by mouth every 6 (six) hours as needed.    [provider]  albuterol (PROVENTIL) (2.5 MG/3ML) 0.083% nebulizer solution Take 2.5 mg by nebulization every  4 (four) hours as needed. 05/29/23   [provider]  albuterol (VENTOLIN HFA) 108 (90 Base) MCG/ACT inhaler Inhale 2 puffs into the lungs every 4 (four) hours as needed for wheezing or shortness of breath.    [provider]  allopurinol (ZYLOPRIM) 300 MG tablet Take 600 mg by mouth daily. 02/24/20   [provider]  amoxicillin-clavulanate (AUGMENTIN) 875-125 MG tablet Take 1 tablet by mouth 2 (two) times daily for 10 days. 07/04/23 07/14/23  Arnetha Courser, MD  aspirin EC 81 MG tablet Take 1 tablet by mouth daily.    [provider]   azelastine (ASTELIN) 0.1 % nasal spray Place 2 sprays into both nostrils 2 (two) times daily. 05/27/23   [provider]  buprenorphine-naloxone (SUBOXONE) 8-2 mg SUBL SL tablet Place 1 tablet under the tongue in the morning, at noon, and at bedtime.    [provider]  busPIRone (BUSPAR) 7.5 MG tablet Take 7.5 mg by mouth 2 (two) times daily. 04/30/23 04/29/24  [provider]  cholecalciferol (VITAMIN D) 1000 units tablet Take 1,000 Units by mouth daily. Patient not taking: Reported on 10/25/2022    [provider]  clopidogrel (PLAVIX) 75 MG tablet Take 1 tablet (75 mg total) by mouth daily. 02/02/21   Willeen Niece, MD  cyanocobalamin (VITAMIN B12) 1000 MCG/ML injection Inject 1 mL (1,000 mcg total) into the muscle every 30 (thirty) days. Patient not taking: Reported on 07/02/2023 07/12/22   Earna Coder, MD  doxycycline (VIBRA-TABS) 100 MG tablet Take 1 tablet (100 mg total) by mouth 2 (two) times daily for 10 days. 07/04/23 07/14/23  Arnetha Courser, MD  DULoxetine (CYMBALTA) 30 MG capsule Take 1 capsule by mouth daily. 01/17/16   [provider]  ezetimibe (ZETIA) 10 MG tablet Take 10 mg by mouth daily. 02/24/20   [provider]  FARXIGA 10 MG TABS tablet Take 10 mg by mouth daily. 09/14/20   [provider]  Fluticasone-Umeclidin-Vilant (TRELEGY ELLIPTA) 100-62.5-25 MCG/ACT AEPB Inhale into the lungs. 09/12/21   [provider]  gabapentin (NEURONTIN) 800 MG tablet Take 800 mg by mouth 4 (four) times daily. 07/14/21   [provider]  hydrochlorothiazide (HYDRODIURIL) 12.5 MG tablet Take 12.5 mg by mouth daily. 05/29/23   [provider]  metFORMIN (GLUCOPHAGE) 1000 MG tablet Take 1,000 mg by mouth 2 (two) times daily with a meal.    [provider]  metoprolol succinate (TOPROL-XL) 50 MG 24 hr tablet Take 50 mg by mouth daily. 02/24/20   [provider]  OXYGEN Inhale 2 L into the lungs.     [provider]  pantoprazole (PROTONIX) 40 MG tablet Take 40 mg by mouth daily. 06/20/21   [provider]  roflumilast (DALIRESP) 500 MCG TABS tablet Take 500 mcg by mouth daily. 08/15/21   [provider]  simvastatin (ZOCOR) 40 MG tablet Take 1 tablet by mouth daily. 01/17/16   [provider]  Syringe/Needle, Disp, (SYRINGE 3CC/25GX1") 25G X 1" 3 ML MISC 1 Syringe by Does not apply route every 30 (thirty) days. 07/12/22   Earna Coder, MD    Physical Exam: Vitals:   07/10/23 1730  BP: 107/66  Pulse: 83  Resp: 20  SpO2: 95%     Data Reviewed: I have reviewed patient's lower extremity imaging obtained in previous admission showing findings of early osteomyelitis.  I have also reviewed patient's including podiatry documentation as well as vitals  Assessment and Plan:  Cellulitis  of left lower extremity with findings of early osteomyelitis Diabetic foot ulcer.   Patient comes in as a direct admission from podiatry office  Previous imaging obtained about a week ago showing findings of early osteomyelitis of calcaneus.   Follow-up on repeat blood cultures  Continue broad-spectrum antibiotic with vancomycin, Flagyl and cefepime Pharmacist consulted for vancomycin dosing Dr. Ether Griffins on board and case discussed I have requested culture results, baseline labs including CBC and BMP which is pending   Peripheral arterial disease (HCC) Continue aspirin and statin Holding on Plavix at this time until after surgical intervention    CKD stage 2 chronic kidney disease (HCC) Follow-up on lab results which is currently pending   Chronic diastolic CHF (congestive heart failure) (HCC)  2D echo on 04/21/2023 showed EF> 55%.   Monitor input and output Daily weight Because of hypotension we will hold off antihypertensives at this time   Type 2 diabetes mellitus with foot ulcer (CODE) (HCC) Placed on insulin therapy Monitor glucose level closely   HLD  (hyperlipidemia) Continue statin therapy   Hypertension Holding off antihypertensives at this time on account of relative hypotension   Chronic pain syndrome -Continue home Suboxone  GERD-continue PPI therapy   COPD (chronic obstructive pulmonary disease) (HCC) Continue oxygen requirement Maintain appropriate saturation Continue as needed nebulization   Depression with anxiety Continue home medication   Sleep apnea Patient does not use CPAP at home   Physical examination: General assessment: Not in acute distress pleasant CNS: Alert and oriented x 3 nonfocal Musculoskeletal: Dressing noted to the left foot appears clean and dry pictures in the media section Respiratory system: Clear to auscultation bilaterally   Advance Care Planning:   Code Status: Prior full  Consults: Podiatry  Family Communication: Discussed with patient's daughter present at bedside  Severity of Illness: The appropriate patient status for this patient is INPATIENT. Inpatient status is judged to be reasonable and necessary in order to provide the required intensity of service to ensure the patient's safety. The patient's presenting symptoms, physical exam findings, and initial radiographic and laboratory data in the context of their chronic comorbidities is felt to place them at high risk for further clinical deterioration. Furthermore, it is not anticipated that the patient will be medically stable for discharge from the hospital within 2 midnights of admission.   * I certify that at the point of admission it is my clinical judgment that the patient will require inpatient hospital care spanning beyond 2 midnights from the point of admission due to high intensity of service, high risk for further deterioration and high frequency of surveillance required.*  Author: Loyce Dys, MD 07/10/2023 6:09 PM  For on call review [email or web address].

## 2023-07-10 NOTE — Progress Notes (Signed)
67 YOM w/ baseline hx/o chronic left foot heel ulcer, COPD on 2-3 L oxygen, hypertension, hyperlipidemia, diabetes mellitus, asthma, TIA, GERD, gout, depression, OSA not on CPAP, anemia, AAA, polysubstance abuse, chronic pain syndrome on Suboxone, non-small cell lung cancer (s/p of RXT), tobacco abuse, CKD-2 recently admitted July 24 through July 26 for cellulitis of left lower extremity. Patient received course of broad-spectrum antibiotics and admission. Podiatry had recommended debridement-however patient refused Patient is in the podiatry office today with noted worsening left heel ulcer Patient now agreeable for operative evaluation Dr. Ether Griffins with podiatry is asking for direct admission from clinic Vital signs otherwise stable per Dr. Ether Griffins Will plan for admission for heel osteomyelitis Plan to start broad-spectrum antibiotics including cefepime, Flagyl, vancomycin. Repeat blood cultures Follow-up formal recommendations by Dr. Ether Griffins in the morning

## 2023-07-10 NOTE — Progress Notes (Signed)
Pharmacy Antibiotic Note  Christian Fields is a 67 y.o. male admitted on 07/10/2023 with osteomyelitis.  Pharmacy has been consulted for Cefepime & Vancomycin dosing.  Plan: Cefepime 2 gm q12hr per indication & renal fxn.   Pt given Vancomycin 1500 mg once. Vancomycin 1250 mg IV Q 24 hrs. Goal AUC 400-550. Expected AUC: 481.8 SCr used: 1.18  Pharmacy will continue to follow and will adjust abx dosing whenever warranted.  No data recorded.   Recent Labs  Lab 07/04/23 0317  CREATININE 1.18    Estimated Creatinine Clearance: 56 mL/min (by C-G formula based on SCr of 1.18 mg/dL).    Allergies  Allergen Reactions   Bee Venom Anaphylaxis    Antimicrobials this admission: 08/01 Cefepime >>  08/01 Vancomycin >>  08/01 Flagyl >>   Microbiology results: 08/01 BCx: Pending  Thank you for allowing pharmacy to be a part of this patient's care.  Otelia Sergeant, PharmD, [facility] 07/10/2023 6:11 PM

## 2023-07-11 ENCOUNTER — Inpatient Hospital Stay: Payer: 59

## 2023-07-11 ENCOUNTER — Encounter: Payer: Self-pay | Admitting: Internal Medicine

## 2023-07-11 ENCOUNTER — Inpatient Hospital Stay: Payer: 59 | Admitting: Registered Nurse

## 2023-07-11 ENCOUNTER — Encounter
Admission: AD | Disposition: A | Discharge status: Home / Self Care | Payer: Self-pay | Source: Ambulatory Visit | Attending: Internal Medicine

## 2023-07-11 ENCOUNTER — Other Ambulatory Visit: Payer: Self-pay

## 2023-07-11 DIAGNOSIS — L97529 Non-pressure chronic ulcer of other part of left foot with unspecified severity: Secondary | ICD-10-CM | POA: Diagnosis not present

## 2023-07-11 DIAGNOSIS — M86172 Other acute osteomyelitis, left ankle and foot: Secondary | ICD-10-CM | POA: Diagnosis not present

## 2023-07-11 DIAGNOSIS — E11622 Type 2 diabetes mellitus with other skin ulcer: Secondary | ICD-10-CM | POA: Diagnosis not present

## 2023-07-11 DIAGNOSIS — E1169 Type 2 diabetes mellitus with other specified complication: Secondary | ICD-10-CM | POA: Diagnosis not present

## 2023-07-11 DIAGNOSIS — E1140 Type 2 diabetes mellitus with diabetic neuropathy, unspecified: Secondary | ICD-10-CM

## 2023-07-11 DIAGNOSIS — L089 Local infection of the skin and subcutaneous tissue, unspecified: Secondary | ICD-10-CM

## 2023-07-11 HISTORY — PX: IRRIGATION AND DEBRIDEMENT FOOT: SHX6602

## 2023-07-11 LAB — GLUCOSE, CAPILLARY
Glucose-Capillary: 89 mg/dL (ref 70–99)
Glucose-Capillary: 63 mg/dL — ABNORMAL LOW (ref 70–99)
Glucose-Capillary: 93 mg/dL (ref 70–99)
Glucose-Capillary: 98 mg/dL (ref 70–99)
Glucose-Capillary: 86 mg/dL (ref 70–99)

## 2023-07-11 LAB — SURGICAL PCR SCREEN
MRSA, PCR: NEGATIVE
Staphylococcus aureus: NEGATIVE

## 2023-07-11 LAB — AEROBIC/ANAEROBIC CULTURE W GRAM STAIN (SURGICAL/DEEP WOUND): Gram Stain: NONE SEEN

## 2023-07-11 SURGERY — IRRIGATION AND DEBRIDEMENT FOOT
Anesthesia: Monitor Anesthesia Care | Laterality: Left

## 2023-07-11 MED ORDER — MIDAZOLAM HCL 2 MG/2ML IJ SOLN
INTRAMUSCULAR | Status: AC
  Filled 2023-07-11: qty 2

## 2023-07-11 MED ORDER — LIDOCAINE HCL (PF) 1 % IJ SOLN
INTRAMUSCULAR | Status: AC
  Filled 2023-07-11: qty 30

## 2023-07-11 MED ORDER — LIDOCAINE HCL (PF) 1 % IJ SOLN
INTRAMUSCULAR | Status: AC
  Filled 2023-07-11: qty 5

## 2023-07-11 MED ORDER — MIDAZOLAM HCL 2 MG/2ML IJ SOLN: 1.0000 mg | Freq: Once | INTRAMUSCULAR | Status: DC

## 2023-07-11 MED ORDER — MIDAZOLAM HCL 2 MG/2ML IJ SOLN: 1.0000 mg | INTRAMUSCULAR | Status: DC | PRN

## 2023-07-11 MED ORDER — LIDOCAINE HCL 1 % IJ SOLN
INTRAMUSCULAR | Status: DC | PRN
  Administered 2023-07-11: 20 mL

## 2023-07-11 MED ORDER — SODIUM CHLORIDE 0.9 % IV SOLN: INTRAVENOUS | Status: DC | PRN

## 2023-07-11 MED ORDER — BUPIVACAINE HCL (PF) 0.25 % IJ SOLN
INTRAMUSCULAR | Status: AC
  Filled 2023-07-11: qty 30

## 2023-07-11 MED ORDER — BUPIVACAINE HCL (PF) 0.5 % IJ SOLN
INTRAMUSCULAR | Status: AC
  Filled 2023-07-11: qty 10

## 2023-07-11 MED ORDER — FENTANYL CITRATE (PF) 100 MCG/2ML IJ SOLN: 25.0000 ug | INTRAMUSCULAR | Status: DC | PRN

## 2023-07-11 MED ORDER — MIDAZOLAM HCL 2 MG/2ML IJ SOLN
INTRAMUSCULAR | Status: DC | PRN
  Administered 2023-07-11 (×2): 1 mg via INTRAVENOUS

## 2023-07-11 MED ORDER — 0.9 % SODIUM CHLORIDE (POUR BTL) OPTIME
TOPICAL | Status: DC | PRN
  Administered 2023-07-11: 500 mL

## 2023-07-11 MED ORDER — ACETAMINOPHEN 10 MG/ML IV SOLN: 1000.0000 mg | Freq: Once | INTRAVENOUS | Status: DC | PRN

## 2023-07-11 MED ORDER — DEXMEDETOMIDINE HCL IN NACL 80 MCG/20ML IV SOLN
INTRAVENOUS | Status: DC | PRN
  Administered 2023-07-11: 8 ug via INTRAVENOUS

## 2023-07-11 MED ORDER — ONDANSETRON HCL 4 MG/2ML IJ SOLN
4.0000 mg | Freq: Four times a day (QID) | INTRAMUSCULAR | Status: DC | PRN
  Administered 2023-07-12: 4 mg via INTRAVENOUS
  Filled 2023-07-11: qty 2

## 2023-07-11 MED ORDER — LIDOCAINE HCL (PF) 1 % IJ SOLN
INTRAMUSCULAR | Status: DC | PRN
Start: 2023-07-11 — End: 2023-07-11
  Administered 2023-07-11: 2 mL via SUBCUTANEOUS

## 2023-07-11 MED ORDER — BUPIVACAINE LIPOSOME 1.3 % IJ SUSP
INTRAMUSCULAR | Status: DC | PRN
  Administered 2023-07-11: 10 mL via PERINEURAL

## 2023-07-11 MED ORDER — LIDOCAINE-EPINEPHRINE (PF) 1 %-1:200000 IJ SOLN
INTRAMUSCULAR | Status: AC
  Filled 2023-07-11: qty 30

## 2023-07-11 MED ORDER — VANCOMYCIN HCL 1 G IV SOLR
INTRAVENOUS | Status: DC | PRN
  Administered 2023-07-11: 1000 mg via TOPICAL

## 2023-07-11 MED ORDER — BUPIVACAINE HCL (PF) 0.5 % IJ SOLN
INTRAMUSCULAR | Status: AC
  Filled 2023-07-11: qty 30

## 2023-07-11 MED ORDER — OXYCODONE HCL 5 MG PO TABS: 5.0000 mg | ORAL_TABLET | Freq: Once | ORAL | Status: DC | PRN

## 2023-07-11 MED ORDER — GENTAMICIN SULFATE 40 MG/ML IJ SOLN
INTRAMUSCULAR | Status: AC
  Filled 2023-07-11: qty 4

## 2023-07-11 MED ORDER — DROPERIDOL 2.5 MG/ML IJ SOLN: 0.6250 mg | Freq: Once | INTRAMUSCULAR | Status: DC | PRN

## 2023-07-11 MED ORDER — LIDOCAINE-EPINEPHRINE 1 %-1:100000 IJ SOLN
INTRAMUSCULAR | Status: AC
  Filled 2023-07-11: qty 1

## 2023-07-11 MED ORDER — ACETAMINOPHEN 325 MG PO TABS
650.0000 mg | ORAL_TABLET | Freq: Four times a day (QID) | ORAL | Status: DC | PRN
  Administered 2023-07-11 – 2023-07-13 (×5): 650 mg via ORAL
  Filled 2023-07-11 (×5): qty 2

## 2023-07-11 MED ORDER — PROMETHAZINE HCL 25 MG/ML IJ SOLN: 6.2500 mg | INTRAMUSCULAR | Status: DC | PRN

## 2023-07-11 MED ORDER — BUPIVACAINE HCL (PF) 0.5 % IJ SOLN
INTRAMUSCULAR | Status: DC | PRN
  Administered 2023-07-11: 10 mL via PERINEURAL

## 2023-07-11 MED ORDER — VANCOMYCIN HCL 1000 MG IV SOLR
INTRAVENOUS | Status: AC
  Filled 2023-07-11: qty 20

## 2023-07-11 MED ORDER — BUPIVACAINE LIPOSOME 1.3 % IJ SUSP
INTRAMUSCULAR | Status: AC
  Filled 2023-07-11: qty 10

## 2023-07-11 SURGICAL SUPPLY — 75 items
AGENT HMST KT MTR STRL THRMB (HEMOSTASIS) ×1
BLADE OSC/SAGITTAL MD 5.5X18 (BLADE) IMPLANT
BLADE OSCILLATING/SAGITTAL (BLADE)
BLADE SURG 15 STRL LF DISP TIS (BLADE) ×1 IMPLANT
BLADE SURG 15 STRL SS (BLADE) ×1
BLADE SW THK.38XMED LNG THN (BLADE) IMPLANT
BNDG CMPR 5X4 CHSV STRCH STRL (GAUZE/BANDAGES/DRESSINGS) ×1
BNDG CMPR 5X4 KNIT ELC UNQ LF (GAUZE/BANDAGES/DRESSINGS) ×1
BNDG CMPR 5X6 CHSV STRCH STRL (GAUZE/BANDAGES/DRESSINGS) ×1
BNDG CMPR 75X21 PLY HI ABS (MISCELLANEOUS) ×1
BNDG COHESIVE 4X5 TAN STRL LF (GAUZE/BANDAGES/DRESSINGS) ×1 IMPLANT
BNDG COHESIVE 6X5 TAN ST LF (GAUZE/BANDAGES/DRESSINGS) ×1 IMPLANT
BNDG ELASTIC 4INX 5YD STR LF (GAUZE/BANDAGES/DRESSINGS) ×1 IMPLANT
BNDG ESMARCH 4 X 12 STRL LF (GAUZE/BANDAGES/DRESSINGS) ×1
BNDG ESMARCH 4X12 STRL LF (GAUZE/BANDAGES/DRESSINGS) ×1 IMPLANT
BNDG GAUZE DERMACEA FLUFF 4 (GAUZE/BANDAGES/DRESSINGS) ×1 IMPLANT
BNDG GZE 12X3 1 PLY HI ABS (GAUZE/BANDAGES/DRESSINGS) ×1
BNDG GZE DERMACEA 4 6PLY (GAUZE/BANDAGES/DRESSINGS) ×1
BNDG STRETCH GAUZE 3IN X12FT (GAUZE/BANDAGES/DRESSINGS) ×1 IMPLANT
CANISTER WOUND CARE 500ML ATS (WOUND CARE) ×1 IMPLANT
CUFF TOURN SGL QUICK 12 (TOURNIQUET CUFF) IMPLANT
CUFF TOURN SGL QUICK 18X4 (TOURNIQUET CUFF) IMPLANT
DRAPE FLUOR MINI C-ARM 54X84 (DRAPES) IMPLANT
DRAPE XRAY CASSETTE 23X24 (DRAPES) IMPLANT
DRSG MEPILEX FLEX 3X3 (GAUZE/BANDAGES/DRESSINGS) IMPLANT
DURAPREP 26ML APPLICATOR (WOUND CARE) ×1 IMPLANT
ELECT REM PT RETURN 9FT ADLT (ELECTROSURGICAL) ×1
ELECTRODE REM PT RTRN 9FT ADLT (ELECTROSURGICAL) ×1 IMPLANT
GAUZE PACKING 0.25INX5YD STRL (GAUZE/BANDAGES/DRESSINGS) ×1 IMPLANT
GAUZE SPONGE 4X4 12PLY STRL (GAUZE/BANDAGES/DRESSINGS) ×1 IMPLANT
GAUZE STRETCH 2X75IN STRL (MISCELLANEOUS) ×1 IMPLANT
GAUZE XEROFORM 1X8 LF (GAUZE/BANDAGES/DRESSINGS) ×1 IMPLANT
GLOVE BIO SURGEON STRL SZ7.5 (GLOVE) ×1 IMPLANT
GLOVE INDICATOR 8.0 STRL GRN (GLOVE) ×1 IMPLANT
GOWN SPEC L4 XLG W/TWL (GOWN DISPOSABLE) ×1 IMPLANT
GOWN STRL REUS W/ TWL LRG LVL3 (GOWN DISPOSABLE) ×1 IMPLANT
GOWN STRL REUS W/TWL LRG LVL3 (GOWN DISPOSABLE) ×1
GOWN STRL REUS W/TWL MED LVL3 (GOWN DISPOSABLE) ×1 IMPLANT
HANDPIECE VERSAJET DEBRIDEMENT (MISCELLANEOUS) IMPLANT
IV NS 1000ML (IV SOLUTION) ×1
IV NS 1000ML BAXH (IV SOLUTION) ×1 IMPLANT
IV NS IRRIG 3000ML ARTHROMATIC (IV SOLUTION) ×1 IMPLANT
KIT DRSG VAC SLVR GRANUFM (MISCELLANEOUS) ×1 IMPLANT
KIT STIMULAN RAPID CURE 5CC (Orthopedic Implant) IMPLANT
KIT TURNOVER KIT A (KITS) ×1 IMPLANT
LABEL OR SOLS (LABEL) ×1 IMPLANT
MANIFOLD NEPTUNE II (INSTRUMENTS) ×1 IMPLANT
NDL BIOPSY JAMSHIDI 11X6 (NEEDLE) IMPLANT
NDL FILTER BLUNT 18X1 1/2 (NEEDLE) ×1 IMPLANT
NDL HYPO 25X1 1.5 SAFETY (NEEDLE) ×1 IMPLANT
NEEDLE BIOPSY JAMSHIDI 11X6 (NEEDLE) ×1 IMPLANT
NEEDLE FILTER BLUNT 18X1 1/2 (NEEDLE) ×1 IMPLANT
NEEDLE HYPO 25X1 1.5 SAFETY (NEEDLE) ×1 IMPLANT
NS IRRIG 500ML POUR BTL (IV SOLUTION) ×1 IMPLANT
PACK EXTREMITY ARMC (MISCELLANEOUS) ×1 IMPLANT
PACKING GAUZE IODOFORM 1INX5YD (GAUZE/BANDAGES/DRESSINGS) ×1 IMPLANT
PAD ABD DERMACEA PRESS 5X9 (GAUZE/BANDAGES/DRESSINGS) ×1 IMPLANT
PULSAVAC PLUS IRRIG FAN TIP (DISPOSABLE) ×1
RASP SM TEAR CROSS CUT (RASP) IMPLANT
SHIELD FULL FACE ANTIFOG 7M (MISCELLANEOUS) ×1 IMPLANT
STOCKINETTE IMPERVIOUS 9X36 MD (GAUZE/BANDAGES/DRESSINGS) ×1 IMPLANT
SURGIFLO W/THROMBIN 8M KIT (HEMOSTASIS) IMPLANT
SUT ETHILON 2 0 FS 18 (SUTURE) ×2 IMPLANT
SUT ETHILON 4-0 (SUTURE) ×1
SUT ETHILON 4-0 FS2 18XMFL BLK (SUTURE) ×1
SUT VIC AB 3-0 SH 27 (SUTURE) ×2
SUT VIC AB 3-0 SH 27X BRD (SUTURE) ×1 IMPLANT
SUT VIC AB 4-0 FS2 27 (SUTURE) ×1 IMPLANT
SUTURE ETHLN 4-0 FS2 18XMF BLK (SUTURE) ×1 IMPLANT
SWAB CULTURE AMIES ANAERIB BLU (MISCELLANEOUS) IMPLANT
SYR 10ML LL (SYRINGE) ×1 IMPLANT
SYR 3ML LL SCALE MARK (SYRINGE) ×1 IMPLANT
TIP FAN IRRIG PULSAVAC PLUS (DISPOSABLE) ×1 IMPLANT
TRAP FLUID SMOKE EVACUATOR (MISCELLANEOUS) ×1 IMPLANT
WATER STERILE IRR 500ML POUR (IV SOLUTION) ×1 IMPLANT

## 2023-07-11 NOTE — Plan of Care (Signed)
  Problem: Activity: Goal: Risk for activity intolerance will decrease Outcome: Progressing   Problem: Pain Managment: Goal: General experience of comfort will improve Outcome: Progressing   Problem: Safety: Goal: Ability to remain free from injury will improve Outcome: Progressing   Problem: Skin Integrity: Goal: Risk for impaired skin integrity will decrease Outcome: Progressing   Problem: Tissue Perfusion: Goal: Adequacy of tissue perfusion will improve Outcome: Progressing

## 2023-07-11 NOTE — Anesthesia Procedure Notes (Signed)
Procedure Name: MAC Date/Time: 07/11/2023 10:15 AM  Performed by: Elmarie Mainland, CRNAPre-anesthesia Checklist: Patient identified, Emergency Drugs available, Suction available and Patient being monitored Patient Re-evaluated:Patient Re-evaluated prior to induction Oxygen Delivery Method: Nasal cannula

## 2023-07-11 NOTE — Op Note (Signed)
Operative note   Surgeon: Armed forces logistics/support/administrative officer: None    Preop diagnosis: 1.  Osteomyelitis left calcaneus 2.  Full-thickness ulcer to calcaneus left heel    Postop diagnosis: Same    Procedure: 1.  Excision plantar calcaneal exostosis 2.  Debridement full-thickness to tendon and muscle plantar left heel 3.  Local tissue rearrangement plantar left heel 4.  Bone biopsy deep left calcaneus 5.  Implantation antibiotic impregnated beads left heel 6.  Intraoperative fluoroscopy without assistance of radiologist    EBL: Minimal    Anesthesia:local, regional, and IV sedation.  A popliteal block had been placed by anesthesia in the preoperative holding area.  Local anesthesia consisting of 1% lidocaine with epinephrine a total of 20 cc was used titrate along the incision site    Hemostasis: Lidocaine with epinephrine along the incision site    Specimen: 1.  Deep bone for culture 2.  Bone for pathology left heel    Complications: None    Operative indications:Christian Fields is an 67 y.o. that presents today for surgical intervention.  The risks/benefits/alternatives/complications have been discussed and consent has been given.    Procedure:  Patient was brought into the OR and placed on the operating table in thesupine position. After anesthesia was obtained theleft lower extremity was prepped and draped in usual sterile fashion.  Attention was directed to the plantar aspect of the left heel.  Large full-thickness ulcer to the plantar central calcaneus was noted.  An incision was made along the medial aspect at the level of the ulcerative site to the medial calcaneus where a noted wound was found that connected to the ulcerative site.  Full-thickness flaps were created down to the calcaneus.  At this time the plantar calcaneal tubercle was dissected completely including the calcaneal spur.  At this time with use of an osteotome the plantar calcaneal spur was removed and this was sent for  pathological evaluation.  This exposed the plantar calcaneus.  At this time a Jamshidi needle was used for 2 bone biopsies to be sent for bone culture.  At this time the plantar ulcerative site was noted and full-thickness this was then debrided full-thickness down to and including the tendon and muscle to the plantar heel.  Debridement was performed with a Versajet and tissue nipper.  The ulcerative site measured 2 cm in diameter both pre and postdebridement.  The wound was then flushed with copious amounts of irrigation.  The plantar aspect of the calcaneus was then packed with antibiotic impregnated vancomycin beads.  Finally a local tissue rearrangement was performed for primary closure of the wound.  A L flap was created in order to primarily close the plantar ulceration.  Closure was performed with a 3-0 Vicryl for the subcutaneous tissue and a 2-0 nylon for the skin.  Throughout the procedure intraoperative fluoroscopy was used to locate the plantar calcaneal tuber and evaluate removal of the calcaneus.    Patient tolerated the procedure and anesthesia well.  Was transported from the OR to the PACU with all vital signs stable and vascular status intact. To be discharged per routine protocol.

## 2023-07-11 NOTE — Progress Notes (Signed)
Progress Note   Patient: Christian Fields IHK:[id number] DOB: 11/23/1956 DOA: 07/10/2023     1 DOS: the patient was seen and examined on 07/11/2023     Subjective:  Patient seen and examined at bedside this morning Denies nausea vomiting abdominal pain chest pain or cough Patient underwent debridement of the left foot today by podiatrist Denied worsening pain nausea vomiting abdominal pain   Brief hospital course:  [name] is a 67 y.o. male with medical history significant of  Chronic hypoxic respiratory failure on 3 L of intranasal oxygen, COPD, hypertension, hyperlipidemia, diabetes mellitus, asthma, TIA, GERD, gout, depression, OSA not on CPAP, polysubstance abuse, chronic pain syndrome on Suboxone, non-small cell lung cancer s/p treatment, tobacco abuse, CKD stage II who was recently admitted here from July 24 to July 26 for cellulitis of left lower extremity received broad-spectrum antibiotics.  During that hospitalization podiatry had recommended surgical intervention however patient opted out, refused and decided to follow-up as an outpatient.  Patient apparently saw podiatry 2 days ago and was found to have worsening of infection with possible underlying osteomyelitis.  Surgical intervention was discussed with patient who agreed to come in today for surgical intervention tomorrow.  Patient is being seen as a direct admission from podiatry clinic. Patient denies nausea vomiting abdominal pain chest pain cough or urinary complaint.    Assessment and Plan: Cellulitis of left lower extremity with findings of early osteomyelitis Diabetic foot ulcer.   Patient comes in as a direct admission from podiatry office  Previous imaging obtained about a week ago showing findings of early osteomyelitis of calcaneus.   Pharmacist on board for vancomycin dosing Continue current broad-spectrum antibiotics pending final recommendation from infectious disease Continue to follow-up on culture  results Dr. Ether Griffins podiatrist on board and case discussed Infectious disease on board and case discussed today I have reviewed patient's CBC as well as BMP results today I have personally reviewed patient's chest x-ray obtained on 07/02/2023 showing hyperinflation of the lungs with no discernible infiltrate   Peripheral arterial disease (HCC) Continue aspirin and statin Holding on Plavix at this time until after surgical intervention and stabilization     CKD stage 2 chronic kidney disease (HCC) Creatinine currently at baseline Continue to monitor   Chronic diastolic CHF (congestive heart failure) (HCC)  2D echo on 04/21/2023 showed EF> 55%.   Monitor input and output Daily weight Because of hypotension we will hold off antihypertensives at this time   Type 2 diabetes mellitus with foot ulcer (CODE) (HCC) Placated by diabetic neuropathy Placed on insulin therapy Monitor glucose level closely Continue gabapentin   HLD (hyperlipidemia) Continue statin therapy   Hypertension Holding off antihypertensives at this time on account of relative hypotension   Chronic pain syndrome -Continue home Suboxone   GERD-continue PPI therapy   COPD (chronic obstructive pulmonary disease) (HCC) Continue oxygen requirement Maintain appropriate saturation Continue as needed nebulization   Depression with anxiety Continue home medication   Sleep apnea Patient does not use CPAP at home        Advance Care Planning:   Code Status: Prior full   Consults: Podiatry   Family Communication: Discussed with patient's daughter present at bedside    Physical Exam: General assessment: Not in acute distress pleasant CNS: Alert and oriented x 3 nonfocal Musculoskeletal: Dressing noted to the left foot appears clean and dry pictures in the media section Respiratory system: Clear to auscultation bilaterally  Vitals:   07/11/23 1145 07/11/23 1200  07/11/23 1211 07/11/23 1502  BP: 114/65 (!)  113/58 112/76 98/71  Pulse: 77 70 86 70  Resp: 12 15 15 18   Temp: (!) 97.4 F (36.3 C)  (!) 97 F (36.1 C) 97.9 F (36.6 C)  TempSrc:    Oral  SpO2: 94% 96% 100% 95%  Weight:      Height:        Data Reviewed: I have reviewed patient's CBC, BMP, vitals, podiatry documentation, infectious disease documentation, cross cover note, and patient previous records  Author: Loyce Dys, MD 07/11/2023 3:56 PM  For on call review [email or web address].

## 2023-07-11 NOTE — Anesthesia Preprocedure Evaluation (Addendum)
Anesthesia Evaluation  Patient identified by MRN, date of birth, ID band Patient awake    Reviewed: Allergy & Precautions, H&P , NPO status , Patient's Chart, lab work & pertinent test results, reviewed documented beta blocker date and time   Airway Mallampati: II  TM Distance: >3 FB Neck ROM: full    Dental no notable dental hx. (+) Teeth Intact   Pulmonary neg pulmonary ROS, shortness of breath and with exertion, asthma , sleep apnea , pneumonia, COPD, former smoker   Pulmonary exam normal breath sounds clear to auscultation       Cardiovascular Exercise Tolerance: Poor hypertension, On Medications + Peripheral Vascular Disease and +CHF  negative cardio ROS  Rhythm:regular Rate:Normal     Neuro/Psych  PSYCHIATRIC DISORDERS Anxiety Depression    TIA Neuromuscular disease negative neurological ROS  negative psych ROS   GI/Hepatic negative GI ROS, Neg liver ROS,GERD  Medicated,,  Endo/Other  negative endocrine ROSdiabetes    Renal/GU Renal disease     Musculoskeletal   Abdominal   Peds  Hematology negative hematology ROS (+) Blood dyscrasia, anemia   Anesthesia Other Findings   Reproductive/Obstetrics negative OB ROS                             Anesthesia Physical Anesthesia Plan  ASA: 4  Anesthesia Plan: MAC   Post-op Pain Management:    Induction:   PONV Risk Score and Plan:   Airway Management Planned:   Additional Equipment:   Intra-op Plan:   Post-operative Plan:   Informed Consent: I have reviewed the patients History and Physical, chart, labs and discussed the procedure including the risks, benefits and alternatives for the proposed anesthesia with the patient or authorized representative who has indicated his/her understanding and acceptance.       Plan Discussed with: CRNA  Anesthesia Plan Comments: (Situation discussed with patient and daughter regarding high  risk of anesthesia secondary to cardiopulmonary status.  In an effort to minimize risk we will plan on a popliteal block with light sedation adjunct to attempt to minimize risk.  Pt. Accepts this plan and is aware of the risk that additional deeper levels of anesthesia may be necessary and he is aware of the urgent need to proceed with the procedure and the subsequent inherent risks. ja)       Anesthesia Quick Evaluation

## 2023-07-11 NOTE — Transfer of Care (Signed)
Immediate Anesthesia Transfer of Care Note  Patient: Christian Fields  Procedure(s) Performed: IRRIGATION AND DEBRIDEMENT FOOT (Left)  Patient Location: PACU  Anesthesia Type:MAC  Level of Consciousness: awake, alert , and oriented  Airway & Oxygen Therapy: Patient Spontanous Breathing and Patient connected to nasal cannula oxygen  Post-op Assessment: Report given to RN and Post -op Vital signs reviewed and stable  Post vital signs: Reviewed and stable  Last Vitals:  Vitals Value Taken Time  BP 114/65 07/11/2023  Temp    Pulse 77 07/11/23 1144  Resp    SpO2 94 % 07/11/23 1144  Vitals shown include unfiled device data.  Last Pain:  Vitals:   07/11/23 0957  TempSrc:   PainSc: 0-No pain         Complications: No notable events documented.

## 2023-07-11 NOTE — Progress Notes (Signed)
Date of Admission:  07/10/2023      ID: Christian Fields is a 67 y.o. male Principal Problem:   Osteomyelitis of ankle or foot, acute, left (HCC)    Subjective: Pt underwent debridement today Doing well, no complaints Medications:   [MAR Hold] aspirin EC  81 mg Oral Daily   [MAR Hold] buprenorphine-naloxone  1 tablet Sublingual TID   [MAR Hold] busPIRone  7.5 mg Oral BID   [MAR Hold] DULoxetine  30 mg Oral Daily   [MAR Hold] gabapentin  800 mg Oral QID   [MAR Hold] insulin aspart  0-5 Units Subcutaneous QHS   [MAR Hold] insulin aspart  0-6 Units Subcutaneous TID WC   midazolam  1 mg Intravenous Once   [MAR Hold] pantoprazole  40 mg Oral Daily   povidone-iodine  2 Application Topical Once   [MAR Hold] simvastatin  40 mg Oral QPC supper    Objective: Vital signs in last 24 hours: Patient Vitals for the past 24 hrs:  BP Temp Temp src Pulse Resp SpO2 Height Weight  07/11/23 0957 106/64 -- -- 76 16 96 % -- --  07/11/23 0951 101/66 -- -- 77 18 96 % -- --  07/11/23 0833 104/69 -- -- 77 17 98 % -- --  07/10/23 2354 93/63 98 F (36.7 C) -- 92 18 95 % -- --  07/10/23 1843 -- -- -- -- -- -- 5\' 5"  (1.651 m) 61.2 kg  07/10/23 1827 -- 98 F (36.7 C) Oral -- -- -- 5\' 7"  (1.702 m) --  07/10/23 1730 107/66 -- -- 83 20 95 % -- --     LDA Foley Central lines Other catheters  PHYSICAL EXAM:  General: Alert, cooperative, no distress, appears stated age.  Head: Normocephalic, without obvious abnormality, atraumatic. Eyes: Conjunctivae clear, anicteric sclerae. Pupils are equal ENT Nares normal. No drainage or sinus tenderness. Lips, mucosa, and tongue normal. No Thrush Neck: Supple, symmetrical, no adenopathy, thyroid: non tender no carotid bruit and no JVD. Back: No CVA tenderness. Lungs: Clear to auscultation bilaterally. No Wheezing or Rhonchi. No rales. Heart: Regular rate and rhythm, no murmur, rub or gallop. Abdomen: Soft, non-tender,not distended. Bowel sounds normal. No  masses Extremities: Left leg surgical dressing Skin: No rashes or lesions. Or bruising Lymph: Cervical, supraclavicular normal. Neurologic: Grossly non-focal  Lab Results    Latest Ref Rng & Units 07/11/2023    3:12 AM 07/10/2023    6:39 PM 07/03/2023    3:31 AM  CBC  WBC 4.0 - 10.5 K/uL 11.2  12.8  6.8   Hemoglobin 13.0 - 17.0 g/dL 16.1  09.6  9.9   Hematocrit 39.0 - 52.0 % 33.5  38.1  30.9   Platelets 150 - 400 K/uL 327  360  219        Latest Ref Rng & Units 07/11/2023    3:12 AM 07/10/2023    6:39 PM 07/04/2023    3:17 AM  CMP  Glucose 70 - 99 mg/dL 97  045    BUN 8 - 23 mg/dL 30  27    Creatinine 4.09 - 1.24 mg/dL 8.11  9.14  7.82   Sodium 135 - 145 mmol/L 138  136    Potassium 3.5 - 5.1 mmol/L 3.9  3.6    Chloride 98 - 111 mmol/L 99  96    CO2 22 - 32 mmol/L 29  28    Calcium 8.9 - 10.3 mg/dL 8.6  9.2    Total Protein  6.5 - 8.1 g/dL  7.9    Total Bilirubin 0.3 - 1.2 mg/dL  0.3    Alkaline Phos 38 - 126 U/L  107    AST 15 - 41 U/L  20    ALT 0 - 44 U/L  16        Microbiology: Surgical culture sent today   Assessment/Plan: Diabetes mellitus with diabetic foot infection Left foot ulcer Calcaneal osteomyelitis question Patient underwent debridement today Excision of the plantar calcaneal exostosis Full-thickness debridement to the tendon and muscle plantar left heel Bone biopsy taken Implantation of antibiotic impregnated beads left heel  Patient is  on vancomycin and cefepime and Flagyl Await bone culture and bone biopsy to decide on the type of antibiotic, duration and route ? PAD status post stent on the left SFA   Diabetes mellitus with diabetic neuropathy   [facility] physician on-call this weekend.  Available by phone for urgent issues.   ____________________

## 2023-07-11 NOTE — Anesthesia Procedure Notes (Signed)
Anesthesia Regional Block: Popliteal block   Pre-Anesthetic Checklist: , timeout performed,  Correct Patient, Correct Site, Correct Laterality,  Correct Procedure, Correct Position, site marked,  Risks and benefits discussed,  Surgical consent,  Pre-op evaluation,  At surgeon's request and post-op pain management  Laterality: Left  Prep: chloraprep       Needles:  Injection technique: Single-shot  Needle Type: Echogenic Stimulator Needle     Needle Length: 9cm  Needle Gauge: 21     Additional Needles:   Procedures:,,,, ultrasound used (permanent image in chart),,    Narrative:  End time: 07/11/2023 10:05 AM Injection made incrementally with aspirations every 5 mL.  Performed by: Personally   Additional Notes: For post op pain per surgeon request.  Risks and Benefits discussed in detail with the patient.  Plan for regional block to assist with postoperative pain management.  Negative aspiration for heme during procedure and no evidence of IV or SA sx.  No pain on injection and tolerated well.  VSST.  Dr. Pernell Dupre

## 2023-07-11 NOTE — Progress Notes (Signed)
Pharmacy Antibiotic Note  Christian Fields is a 67 y.o. male admitted on 07/10/2023 with a diabetic foot infection concerning for early osteomyelitis. Patient has an extensive past medical history which consists of T2DM, HTN, HLD, CKD stage 2, chronic diastolic HF, asthma, PAD, TIA, GERD, gout, depression, OSA not on CPAP, anemia, AAA, polysubstance abuse, chronic pain syndrome (Suboxone), non-small cell lung cancer (s/p of RXT), and COPD on 2-3 L oxygen. He was recently admitted from 7/24-7/26 where he received treatment with ceftriaxone and vancomycin. Patient deferred the surgical interventions that were recommended and was discharged on a 10-day course of Augmentin and doxycycline. After following up with his outpatient podiatrist, he was readmitted after infection was found to be worsening. Pharmacy has been consulted for Cefepime & Vancomycin dosing.  Plan: Continue cefepime 2 gm Q12H Reduce vancomycin to 1 gm Q24H Goal AUC 400-550 Expected AUC 483.5 Expected Cmin 11.4 Scr 1.20, TBW, Vd 0.72  Also on metronidazole 500 mg IV Q12H Pharmacy will continue to follow and adjust antibiotic dosing whenever warranted  Temp (24hrs), Avg:98 F (36.7 C), Min:98 F (36.7 C), Max:98 F (36.7 C)  Recent Labs  Lab 07/10/23 1839 07/11/23 0312  WBC 12.8* 11.2*  CREATININE 1.25* 1.20    Estimated Creatinine Clearance: 51 mL/min (by C-G formula based on SCr of 1.2 mg/dL).    Allergies  Allergen Reactions   Bee Venom Anaphylaxis   Antimicrobials this admission: 08/01 Cefepime >>  08/01 Vancomycin >>  08/01 Flagyl >>   Microbiology results: 08/01 BCx: NG < 24h 08/01 Wound Cx: pending  Thank you for allowing pharmacy to be a part of this patient's care.  Littie Deeds, PharmD PGY1 Pharmacy Resident 07/11/2023 8:49 AM

## 2023-07-12 DIAGNOSIS — M86172 Other acute osteomyelitis, left ankle and foot: Secondary | ICD-10-CM | POA: Diagnosis not present

## 2023-07-12 LAB — GLUCOSE, CAPILLARY
Glucose-Capillary: 114 mg/dL — ABNORMAL HIGH (ref 70–99)
Glucose-Capillary: 104 mg/dL — ABNORMAL HIGH (ref 70–99)
Glucose-Capillary: 111 mg/dL — ABNORMAL HIGH (ref 70–99)
Glucose-Capillary: 108 mg/dL — ABNORMAL HIGH (ref 70–99)

## 2023-07-12 MED ORDER — SODIUM CHLORIDE 0.9 % IV SOLN
2.0000 g | Freq: Three times a day (TID) | INTRAVENOUS | Status: DC
  Administered 2023-07-12 – 2023-07-14 (×7): 2 g via INTRAVENOUS
  Filled 2023-07-12 (×8): qty 12.5

## 2023-07-12 MED ORDER — CLOPIDOGREL BISULFATE 75 MG PO TABS
75.0000 mg | ORAL_TABLET | Freq: Every day | ORAL | Status: DC
  Administered 2023-07-12 – 2023-07-14 (×3): 75 mg via ORAL
  Filled 2023-07-12 (×3): qty 1

## 2023-07-12 MED ORDER — ENOXAPARIN SODIUM 40 MG/0.4ML IJ SOSY
40.0000 mg | PREFILLED_SYRINGE | INTRAMUSCULAR | Status: DC
  Administered 2023-07-12 – 2023-07-14 (×3): 40 mg via SUBCUTANEOUS
  Filled 2023-07-12 (×3): qty 0.4

## 2023-07-12 MED ORDER — GABAPENTIN 300 MG PO CAPS
600.0000 mg | ORAL_CAPSULE | Freq: Three times a day (TID) | ORAL | Status: DC
  Administered 2023-07-12 – 2023-07-14 (×5): 600 mg via ORAL
  Filled 2023-07-12 (×5): qty 2

## 2023-07-12 NOTE — Progress Notes (Addendum)
Progress Note   Patient: Christian Fields OZH:[id number] DOB: [birth year] DOA: 07/10/2023     2 DOS: the patient was seen and examined on 07/12/2023      Subjective:  Patient seen and examined at bedside this morning Denies foot pain, nausea vomiting abdominal pain Still requiring antibiotics awaiting culture results Seen by podiatry this morning and case discussed     Brief hospital course:  Christian Fields is a 67 y.o. male with medical history significant of  Chronic hypoxic respiratory failure on 3 L of intranasal oxygen, COPD, hypertension, hyperlipidemia, diabetes mellitus, asthma, TIA, GERD, gout, depression, OSA not on CPAP, polysubstance abuse, chronic pain syndrome on Suboxone, non-small cell lung cancer s/p treatment, tobacco abuse, CKD stage II who was recently admitted here from July 24 to July 26 for cellulitis of left lower extremity received broad-spectrum antibiotics.  During that hospitalization podiatry had recommended surgical intervention however patient opted out, refused and decided to follow-up as an outpatient.  Patient apparently saw podiatry 2 days ago and was found to have worsening of infection with possible underlying osteomyelitis.  Surgical intervention was discussed with patient who agreed to come in today for surgical intervention tomorrow.  Patient is being seen as a direct admission from podiatry clinic. Patient denies nausea vomiting abdominal pain chest pain cough or urinary complaint.     Assessment and Plan: Cellulitis of left lower extremity with findings of early osteomyelitis Diabetic foot ulcer.   Patient comes in as a direct admission from podiatry office  Previous imaging obtained about a week ago showing findings of early osteomyelitis of calcaneus.   Pharmacist on board for vancomycin dosing Still requiring antibiotics awaiting culture results Seen by podiatry this morning and case discussed Continue to follow-up on culture results Infectious  disease on board we appreciate input    Peripheral arterial disease (HCC) Continue aspirin and statin Plavix resumed   CKD stage 2 chronic kidney disease (HCC) Creatinine currently at baseline Continue to monitor   Chronic diastolic CHF (congestive heart failure) (HCC)  2D echo on 04/21/2023 showed EF> 55%.   Monitor input and output Daily weight Because of hypotension we will hold off antihypertensives at this time   Type 2 diabetes mellitus with foot ulcer (CODE) (HCC) Placated by diabetic neuropathy Placed on insulin therapy Monitor glucose level closely Continue gabapentin adjusted for renal clearance   HLD (hyperlipidemia) Continue statin therapy   Hypertension Holding off antihypertensives at this time on account of relative hypotension   Chronic pain syndrome Continue Suboxone   GERD-continue PPI therapy   COPD (chronic obstructive pulmonary disease) (HCC) Continue oxygen requirement Maintain appropriate saturation Continue as needed nebulization   Depression with anxiety Continue home medication   Sleep apnea Patient does not use CPAP at home   DVT prophylaxis-continue Lovenox    Advance Care Planning:   Code Status: Prior full   Consults: Podiatry   Family Communication: Discussed with patient's daughter present at bedside       Physical Exam: General assessment: Not in acute distress pleasant CNS: Alert and oriented x 3 nonfocal Musculoskeletal: Dressing noted to the left leg is clean and dry Respiratory system: Clear to auscultation bilaterally    Data Reviewed: I have reviewed patient culture results today showing no growth, CBC, BMP, transition of care documentation nursing documentation as well as infectious disease   Vitals:   07/11/23 2011 07/11/23 2338 07/12/23 0410 07/12/23 0736  BP: 133/75 (!) 91/59 101/62 102/62  Pulse: 72 76 74  95  Resp: 18 18 17 16   Temp: 97.7 F (36.5 C) 97.8 F (36.6 C) 97.8 F (36.6 C) 98.6 F (37 C)   TempSrc: Oral Oral Oral Oral  SpO2: 94% 95% 96% 90%  Weight:      Height:         Author: Loyce Dys, MD 07/12/2023 1:55 PM  For on call review [email or web address].

## 2023-07-12 NOTE — Progress Notes (Signed)
Pharmacy Antibiotic Note  Christian Fields is a 67 y.o. male admitted on 07/10/2023 with a diabetic foot infection concerning for early osteomyelitis. Patient has an extensive past medical history which consists of T2DM, HTN, HLD, CKD stage 2, chronic diastolic HF, asthma, PAD, TIA, GERD, gout, depression, OSA not on CPAP, anemia, AAA, polysubstance abuse, chronic pain syndrome (Suboxone), non-small cell lung cancer (s/p of RXT), and COPD on 2-3 L oxygen. He was recently admitted from 7/24-7/26 where he received treatment with ceftriaxone and vancomycin. Patient deferred the surgical interventions that were recommended and was discharged on a 10-day course of Augmentin and doxycycline. After following up with his outpatient podiatrist, he was readmitted after infection was found to be worsening. Pharmacy has been consulted for Cefepime & Vancomycin dosing.  Plan: Scr improved 1.20>>0.93   Will adjust cefepime 2 gm to Q8H   for Crcl 65.8 ml/min Will continue vancomycin at 1250mg  Q24H. Assess stability of renal fxn Goal AUC 400-550 Expected AUC 480.7 Expected Cmin 9.9 Scr 0.93, TBW, Vd 0.72  Also on metronidazole 500 mg IV Q12H F/u renal fxn, cultures, LOT  Temp (24hrs), Avg:97.7 F (36.5 C), Min:97 F (36.1 C), Max:98.6 F (37 C)  Recent Labs  Lab 07/10/23 1839 07/11/23 0312 07/12/23 0841  WBC 12.8* 11.2* 11.2*  CREATININE 1.25* 1.20 0.93    Estimated Creatinine Clearance: 65.8 mL/min (by C-G formula based on SCr of 0.93 mg/dL).    Allergies  Allergen Reactions   Bee Venom Anaphylaxis   Antimicrobials this admission: 08/01 Cefepime >>  08/01 Vancomycin >>  08/01 Flagyl >>   Microbiology results: 08/01 BCx: NG x2d 08/01 Wound Cx: NG,24hr 8/1 wound cx: GPC  Thank you for allowing pharmacy to be a part of this patient's care.  Bari Mantis PharmD Clinical Pharmacist 07/12/2023

## 2023-07-12 NOTE — Plan of Care (Signed)
  Problem: Activity: Goal: Risk for activity intolerance will decrease Outcome: Progressing   Problem: Pain Managment: Goal: General experience of comfort will improve Outcome: Progressing   Problem: Safety: Goal: Ability to remain free from injury will improve Outcome: Progressing   Problem: Skin Integrity: Goal: Risk for impaired skin integrity will decrease Outcome: Progressing   Problem: Tissue Perfusion: Goal: Adequacy of tissue perfusion will improve Outcome: Progressing

## 2023-07-12 NOTE — Progress Notes (Addendum)
PODIATRY / FOOT AND ANKLE SURGERY PROGRESS NOTE  Reason for consult: Left heel infection   HPI: Christian Fields is a 67 y.o. male who presents with for follow-up status post 1 day left foot incision and drainage with partial calcanectomy with rotational skin flap closure.  Patient has remained nonweightbearing since procedure.  Patient did have some bleeding through his bandage.  Patient currently denies any pain.  PMHx:  Past Medical History:  Diagnosis Date   Anemia    Anxiety    Arthritis    Asthma    Cancer (HCC)    Basal Cell Skin Cancer   Chronic back pain    COPD (chronic obstructive pulmonary disease) (HCC)    Depression    Diabetes mellitus (HCC)    Dyspnea    GERD (gastroesophageal reflux disease)    Gout    Gout    Headache    History of blood clots    Left Leg--July 2018   History of kidney stones    Hyperlipidemia    Hyperlipidemia    Hypertension    Kidney stones    Neuropathy    On home oxygen therapy    2 L / M   Pneumonia 06/2017   Sleep apnea    Ulcer of foot (HCC)    Right    Surgical Hx:  Past Surgical History:  Procedure Laterality Date   APPENDECTOMY     DG FEET 2 VIEWS BILAT     IRRIGATION AND DEBRIDEMENT FOOT Left 07/11/2023   Procedure: IRRIGATION AND DEBRIDEMENT FOOT;  Surgeon: Gwyneth Revels, DPM;  Location: [facility] ORS;  Service: Orthopedics/Podiatry;  Laterality: Left;   LIPOMA EXCISION Right 08/15/2017   Procedure: EXCISION TUMOR(CYST) FOOT;  Surgeon: Recardo Evangelist, DPM;  Location: [facility] ORS;  Service: Podiatry;  Laterality: Right;   LOWER EXTREMITY ANGIOGRAPHY Left 07/03/2023   Procedure: Lower Extremity Angiography;  Surgeon: Annice Needy, MD;  Location: [facility] INVASIVE CV LAB;  Service: Cardiovascular;  Laterality: Left;   OTHER SURGICAL HISTORY Bilateral Foot surgery    FHx:  Family History  Problem Relation Age of Onset   Hypertension Mother     Social History:  reports that he has quit smoking. His smoking use included cigarettes.  He started smoking about 6 years ago. He has a 3.3 pack-year smoking history. He has never used smokeless tobacco. He reports current alcohol use of about 1.0 standard drink of alcohol per week. He reports that he does not use drugs.  Allergies:  Allergies  Allergen Reactions   Bee Venom Anaphylaxis   Medications Prior to Admission  Medication Sig Dispense Refill   acetaminophen (TYLENOL) 325 MG tablet Take 650 mg by mouth every 6 (six) hours as needed.     albuterol (PROVENTIL) (2.5 MG/3ML) 0.083% nebulizer solution Take 2.5 mg by nebulization every 4 (four) hours as needed.     albuterol (VENTOLIN HFA) 108 (90 Base) MCG/ACT inhaler Inhale 2 puffs into the lungs every 4 (four) hours as needed for wheezing or shortness of breath.     allopurinol (ZYLOPRIM) 300 MG tablet Take 600 mg by mouth daily.     amoxicillin-clavulanate (AUGMENTIN) 875-125 MG tablet Take 1 tablet by mouth 2 (two) times daily for 10 days. 20 tablet 0   aspirin EC 81 MG tablet Take 1 tablet by mouth daily.     azelastine (ASTELIN) 0.1 % nasal spray Place 2 sprays into both nostrils 2 (two) times daily.     buprenorphine-naloxone (SUBOXONE) 8-2 mg  SUBL SL tablet Place 1 tablet under the tongue in the morning, at noon, and at bedtime.     busPIRone (BUSPAR) 7.5 MG tablet Take 7.5 mg by mouth 2 (two) times daily.     clopidogrel (PLAVIX) 75 MG tablet Take 1 tablet (75 mg total) by mouth daily. 30 tablet 1   cyanocobalamin (VITAMIN B12) 1000 MCG/ML injection Inject 1 mL (1,000 mcg total) into the muscle every 30 (thirty) days. 1 mL 11   doxycycline (VIBRA-TABS) 100 MG tablet Take 1 tablet (100 mg total) by mouth 2 (two) times daily for 10 days. 20 tablet 0   DULoxetine (CYMBALTA) 30 MG capsule Take 1 capsule by mouth daily.     ezetimibe (ZETIA) 10 MG tablet Take 10 mg by mouth daily.     FARXIGA 10 MG TABS tablet Take 10 mg by mouth daily.     fluticasone (FLONASE) 50 MCG/ACT nasal spray Place 1 spray into both nostrils daily  as needed for allergies.     Fluticasone-Umeclidin-Vilant (TRELEGY ELLIPTA) 100-62.5-25 MCG/ACT AEPB Inhale into the lungs.     gabapentin (NEURONTIN) 800 MG tablet Take 800 mg by mouth 4 (four) times daily.     hydrochlorothiazide (HYDRODIURIL) 12.5 MG tablet Take 12.5 mg by mouth daily.     metFORMIN (GLUCOPHAGE) 1000 MG tablet Take 1,000 mg by mouth 2 (two) times daily with a meal.     metoprolol succinate (TOPROL-XL) 50 MG 24 hr tablet Take 50 mg by mouth daily.     ondansetron (ZOFRAN-ODT) 4 MG disintegrating tablet Take 4 mg by mouth every 8 (eight) hours as needed for nausea.     pantoprazole (PROTONIX) 40 MG tablet Take 40 mg by mouth daily.     roflumilast (DALIRESP) 500 MCG TABS tablet Take 500 mcg by mouth daily.     simvastatin (ZOCOR) 40 MG tablet Take 1 tablet by mouth daily.     cholecalciferol (VITAMIN D) 1000 units tablet Take 1,000 Units by mouth daily.     OXYGEN Inhale 2 L into the lungs.     Syringe/Needle, Disp, (SYRINGE 3CC/25GX1") 25G X 1" 3 ML MISC 1 Syringe by Does not apply route every 30 (thirty) days. 12 each 0    Physical Exam: General: Alert and oriented.  No apparent distress.  Vascular: DP/PT pulses diminished bilateral.  Capillary fill time intact to digits bilateral.  No hair growth noted to digits bilaterally.  Neuro: Light touch sensation absent to bilateral lower extremities.  Derm: Plantar heel area appears to have an incision that is intact today with no openings, minimal serous drainage, no odor, no erythema or edema, no obvious signs of infection clinically.    MSK: Calcaneal gait left  Results for orders placed or performed during the hospital encounter of 07/10/23 (from the past 48 hour(s))  CBC with Differential/Platelet     Status: Abnormal   Collection Time: 07/10/23  6:39 PM  Result Value Ref Range   WBC 12.8 (H) 4.0 - 10.5 K/uL   RBC 3.85 (L) 4.22 - 5.81 MIL/uL   Hemoglobin 11.9 (L) 13.0 - 17.0 g/dL   HCT 19.1 (L) 47.8 - 29.5 %    MCV 99.0 80.0 - 100.0 fL   MCH 30.9 26.0 - 34.0 pg   MCHC 31.2 30.0 - 36.0 g/dL   RDW 62.1 30.8 - 65.7 %   Platelets 360 150 - 400 K/uL   nRBC 0.0 0.0 - 0.2 %   Neutrophils Relative % 65 %   Neutro  Abs 8.4 (H) 1.7 - 7.7 K/uL   Lymphocytes Relative 25 %   Lymphs Abs 3.2 0.7 - 4.0 K/uL   Monocytes Relative 5 %   Monocytes Absolute 0.6 0.1 - 1.0 K/uL   Eosinophils Relative 3 %   Eosinophils Absolute 0.4 0.0 - 0.5 K/uL   Basophils Relative 1 %   Basophils Absolute 0.1 0.0 - 0.1 K/uL   Immature Granulocytes 1 %   Abs Immature Granulocytes 0.08 (H) 0.00 - 0.07 K/uL    Comment: Performed at [facility], [address], [place], [place] [address]  Comprehensive metabolic panel     Status: Abnormal   Collection Time: 07/10/23  6:39 PM  Result Value Ref Range   Sodium 136 135 - 145 mmol/L   Potassium 3.6 3.5 - 5.1 mmol/L   Chloride 96 (L) 98 - 111 mmol/L   CO2 28 22 - 32 mmol/L   Glucose, Bld 106 (H) 70 - 99 mg/dL    Comment: Glucose reference range applies only to samples taken after fasting for at least 8 hours.   BUN 27 (H) 8 - 23 mg/dL   Creatinine, Ser 4.40 (H) 0.61 - 1.24 mg/dL   Calcium 9.2 8.9 - 34.7 mg/dL   Total Protein 7.9 6.5 - 8.1 g/dL   Albumin 3.4 (L) 3.5 - 5.0 g/dL   AST 20 15 - 41 U/L   ALT 16 0 - 44 U/L   Alkaline Phosphatase 107 38 - 126 U/L   Total Bilirubin 0.3 0.3 - 1.2 mg/dL   GFR, Estimated >42 >59 mL/min    Comment: (NOTE) Calculated using the CKD-EPI Creatinine Equation (2021)    Anion gap 12 5 - 15    Comment: Performed at [facility], [address] [place], [place] [address]  Culture, blood (Routine X 2) w Reflex to ID Panel     Status: None (Preliminary result)   Collection Time: 07/10/23  6:39 PM   Specimen: BLOOD  Result Value Ref Range   Specimen Description BLOOD RIGHT ANTECUBITAL    Special Requests      BOTTLES DRAWN AEROBIC AND ANAEROBIC Blood Culture adequate volume   Culture      NO GROWTH 2 DAYS Performed at  [facility], [address] [place], [place] [address]    Report Status PENDING   Culture, blood (Routine X 2) w Reflex to ID Panel     Status: None (Preliminary result)   Collection Time: 07/10/23  6:46 PM   Specimen: BLOOD  Result Value Ref Range   Specimen Description BLOOD LEFT ANTECUBITAL    Special Requests      BOTTLES DRAWN AEROBIC AND ANAEROBIC Blood Culture adequate volume   Culture      NO GROWTH 2 DAYS Performed at [facility], [address] [place], [place] [address]    Report Status PENDING   Hemoglobin A1c     Status: None   Collection Time: 07/10/23  6:46 PM  Result Value Ref Range   Hgb A1c MFr Bld 5.6 4.8 - 5.6 %    Comment: (NOTE) Pre diabetes:          5.7%-6.4%  Diabetes:              >6.4%  Glycemic control for   <7.0% adults with diabetes    Mean Plasma Glucose 114.02 mg/dL    Comment: Performed at [facility], [address], [place], [place] [address]  Glucose, capillary  Status: Abnormal   Collection Time: 07/10/23  6:47 PM  Result Value Ref Range   Glucose-Capillary 103 (H) 70 - 99 mg/dL    Comment: Glucose reference range applies only to samples taken after fasting for at least 8 hours.  Aerobic Culture w Gram Stain (superficial specimen)     Status: None (Preliminary result)   Collection Time: 07/10/23  8:35 PM   Specimen: Foot; Wound  Result Value Ref Range   Specimen Description      FOOT Performed at [facility], [address] [place], [place] [address]    Special Requests      NONE Performed at [facility], [address] [place], [place] [address]    Gram Stain      FEW WBC PRESENT, PREDOMINANTLY PMN RARE GRAM POSITIVE COCCI    Culture      TOO YOUNG TO READ Performed at [facility], [address], [place], [place] [address]    Report Status PENDING   Glucose, capillary     Status: Abnormal   Collection Time: 07/10/23  9:56 PM  Result Value Ref Range    Glucose-Capillary 115 (H) 70 - 99 mg/dL    Comment: Glucose reference range applies only to samples taken after fasting for at least 8 hours.  CBC with Differential/Platelet     Status: Abnormal   Collection Time: 07/11/23  3:12 AM  Result Value Ref Range   WBC 11.2 (H) 4.0 - 10.5 K/uL   RBC 3.36 (L) 4.22 - 5.81 MIL/uL   Hemoglobin 10.4 (L) 13.0 - 17.0 g/dL   HCT 71.6 (L) 96.7 - 89.3 %   MCV 99.7 80.0 - 100.0 fL   MCH 31.0 26.0 - 34.0 pg   MCHC 31.0 30.0 - 36.0 g/dL   RDW 81.0 17.5 - 10.2 %   Platelets 327 150 - 400 K/uL   nRBC 0.0 0.0 - 0.2 %   Neutrophils Relative % 69 %   Neutro Abs 7.8 (H) 1.7 - 7.7 K/uL   Lymphocytes Relative 18 %   Lymphs Abs 2.0 0.7 - 4.0 K/uL   Monocytes Relative 7 %   Monocytes Absolute 0.8 0.1 - 1.0 K/uL   Eosinophils Relative 4 %   Eosinophils Absolute 0.4 0.0 - 0.5 K/uL   Basophils Relative 1 %   Basophils Absolute 0.1 0.0 - 0.1 K/uL   Immature Granulocytes 1 %   Abs Immature Granulocytes 0.06 0.00 - 0.07 K/uL    Comment: Performed at [facility], [address], [place], [place] [address]  Basic metabolic panel     Status: Abnormal   Collection Time: 07/11/23  3:12 AM  Result Value Ref Range   Sodium 138 135 - 145 mmol/L   Potassium 3.9 3.5 - 5.1 mmol/L   Chloride 99 98 - 111 mmol/L   CO2 29 22 - 32 mmol/L   Glucose, Bld 97 70 - 99 mg/dL    Comment: Glucose reference range applies only to samples taken after fasting for at least 8 hours.   BUN 30 (H) 8 - 23 mg/dL   Creatinine, Ser 7.82 0.61 - 1.24 mg/dL   Calcium 8.6 (L) 8.9 - 10.3 mg/dL   GFR, Estimated >42 >35 mL/min    Comment: (NOTE) Calculated using the CKD-EPI Creatinine Equation (2021)    Anion gap 10 5 - 15    Comment: Performed at [facility], [address] [place], [place] [address]  Glucose, capillary  Status: None   Collection Time: 07/11/23  7:22 AM  Result Value Ref Range   Glucose-Capillary 89 70 - 99 mg/dL    Comment: Glucose reference range  applies only to samples taken after fasting for at least 8 hours.  Surgical pcr screen     Status: None   Collection Time: 07/11/23  7:30 AM   Specimen: Nasal Mucosa; Nasal Swab  Result Value Ref Range   MRSA, PCR NEGATIVE NEGATIVE   Staphylococcus aureus NEGATIVE NEGATIVE    Comment: (NOTE) The Xpert SA Assay (FDA approved for NASAL specimens in patients 53 years of age and older), is one component of a comprehensive surveillance program. It is not intended to diagnose infection nor to guide or monitor treatment. Performed at [facility], [address] [place], [place] [address]   Aerobic/Anaerobic Culture w Gram Stain (surgical/deep wound)     Status: None (Preliminary result)   Collection Time: 07/11/23 11:00 AM   Specimen: Wound  Result Value Ref Range   Specimen Description      WOUND Performed at [facility], [address] [place], [place] [address]    Special Requests      NONE Performed at [facility], [address] [place], [place] [address]    Gram Stain NO WBC SEEN NO ORGANISMS SEEN     Culture      NO GROWTH < 24 HOURS Performed at [facility], [address] [place], [place] [address]    Report Status PENDING   Glucose, capillary     Status: Abnormal   Collection Time: 07/11/23 11:48 AM  Result Value Ref Range   Glucose-Capillary 63 (L) 70 - 99 mg/dL    Comment: Glucose reference range applies only to samples taken after fasting for at least 8 hours.  Glucose, capillary     Status: None   Collection Time: 07/11/23 12:09 PM  Result Value Ref Range   Glucose-Capillary 93 70 - 99 mg/dL    Comment: Glucose reference range applies only to samples taken after fasting for at least 8 hours.  Glucose, capillary     Status: None   Collection Time: 07/11/23  5:27 PM  Result Value Ref Range   Glucose-Capillary 98 70 - 99 mg/dL    Comment: Glucose reference range applies only to samples taken after fasting for at least 8  hours.  Glucose, capillary     Status: None   Collection Time: 07/11/23 10:11 PM  Result Value Ref Range   Glucose-Capillary 86 70 - 99 mg/dL    Comment: Glucose reference range applies only to samples taken after fasting for at least 8 hours.  Glucose, capillary     Status: Abnormal   Collection Time: 07/12/23  8:01 AM  Result Value Ref Range   Glucose-Capillary 114 (H) 70 - 99 mg/dL    Comment: Glucose reference range applies only to samples taken after fasting for at least 8 hours.  CBC with Differential/Platelet     Status: Abnormal   Collection Time: 07/12/23  8:41 AM  Result Value Ref Range   WBC 11.2 (H) 4.0 - 10.5 K/uL   RBC 3.58 (L) 4.22 - 5.81 MIL/uL   Hemoglobin 11.2 (L) 13.0 - 17.0 g/dL   HCT 65.7 (L) 84.6 - 96.2 %   MCV 99.2 80.0 - 100.0 fL   MCH 31.3 26.0 - 34.0 pg   MCHC 31.5 30.0 - 36.0 g/dL   RDW 95.2 84.1 - 32.4 %  Platelets 326 150 - 400 K/uL   nRBC 0.0 0.0 - 0.2 %   Neutrophils Relative % 71 %   Neutro Abs 7.9 (H) 1.7 - 7.7 K/uL   Lymphocytes Relative 17 %   Lymphs Abs 2.0 0.7 - 4.0 K/uL   Monocytes Relative 6 %   Monocytes Absolute 0.6 0.1 - 1.0 K/uL   Eosinophils Relative 5 %   Eosinophils Absolute 0.6 (H) 0.0 - 0.5 K/uL   Basophils Relative 1 %   Basophils Absolute 0.1 0.0 - 0.1 K/uL   Immature Granulocytes 0 %   Abs Immature Granulocytes 0.05 0.00 - 0.07 K/uL    Comment: Performed at [facility], [address] [place], [place] [address]  Basic metabolic panel     Status: Abnormal   Collection Time: 07/12/23  8:41 AM  Result Value Ref Range   Sodium 136 135 - 145 mmol/L   Potassium 3.6 3.5 - 5.1 mmol/L   Chloride 101 98 - 111 mmol/L   CO2 24 22 - 32 mmol/L   Glucose, Bld 120 (H) 70 - 99 mg/dL    Comment: Glucose reference range applies only to samples taken after fasting for at least 8 hours.   BUN 29 (H) 8 - 23 mg/dL   Creatinine, Ser 4.25 0.61 - 1.24 mg/dL   Calcium 8.6 (L) 8.9 - 10.3 mg/dL   GFR, Estimated >95 >63 mL/min     Comment: (NOTE) Calculated using the CKD-EPI Creatinine Equation (2021)    Anion gap 11 5 - 15    Comment: Performed at [facility], [address] [place], [place] [address]   DG MINI C-ARM IMAGE ONLY  Result Date: 07/11/2023 There is no interpretation for this exam.  This order is for images obtained during a surgical procedure.  Please See "Surgeries" Tab for more information regarding the procedure.   [place] OR NERVE BLOCK-IMAGE ONLY [facility])  Result Date: 07/11/2023 There is no interpretation for this exam.  This order is for images obtained during a surgical procedure.  Please See "Surgeries" Tab for more information regarding the procedure.    Blood pressure 102/62, pulse 95, temperature 98.6 F (37 C), temperature source Oral, resp. rate 16, height 5\' 5"  (1.651 m), weight 61.2 kg, SpO2 90%.  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  Plan -Patient seen and examined. -Incision site appears to be well coapted overall with sutures intact, no dehiscence present, minimal drainage today.  No odor, reduction in erythema and edema present today overall.  Appears to be stable at this time. -Redressed today with Xeroform to the incision line followed by 4 x 4 gauze, ABD pads, Kerlix, Ace wrap. -Patient to keep dressings clean, dry, and intact.  Will plan on changing on Monday.  Patient remain nonweightbearing at all times left lower extremity, stressed importance. -So far cultures from surgery are not growing any bacteria.  Wound culture growing gram-positive cocci.  Appreciate infectious disease recommendations for antibiotic therapy.  Path pending. -PT orders placed.  Rosetta Posner, DPM 07/12/2023, 10:11 AM

## 2023-07-12 NOTE — TOC Progression Note (Signed)
Transition of Care [facility]) - Progression Note    Patient Details  Name: Christian Fields MRN: [id number] Date of Birth: 1956/02/07  Transition of Care [facility]) CM/SW Contact  Christian Reddish, RN Phone Number: 07/12/2023, 10:21 AM  Clinical Narrative:   Chart reviewed.  Noted that patient was admitted with Cellulitis of left lower extremity with findings of early osteomyelitis and Diabetic foot ulcer.  Patient is currently on IV vancomycin and cefepime and Flagyl.  I and D following.  Noted that patient had eft foot incision and drainage with partial calcanectomy with rotational skin flap closure on 07/11/23.    I have spoken with Christian Fields.  He informs me that prior to admission he lived at home with his son.  He reported that he was able to bath and dress himself prior to admission.  He informs me that prior to admission he was try to stay off his foot and was not getting around that well at home.  He reports that he has 2 walkers that have wheels on them and a walker that does not have wheels.  He reports that his daughter has a BSC at home.    Christian Fields reported that his daughter would transport him to appointments.  On discharge patient reports that he will be going home with his daughter.  Patient reports that prior to admission he was active with home health.  He is not sure of home health agency name.    TOC will continue to follow for discharge planning.     Expected Discharge Plan: Home w Home Health Services Barriers to Discharge: No Barriers Identified  Expected Discharge Plan and Services   Discharge Planning Services: CM Consult Post Acute Care Choice: Home Health (Patient plans on going home with daughter on discharge.) Living arrangements for the past 2 months: Skilled Nursing Facility (Prior to admission patient's son lived at home with him.)                 DME Arranged:  (Patient reports that he has  2 walkers with wheels at home, and one walker with no wheels.  Patient reports  that his daughter has a bedside commode at home.)         HH Arranged:  (Patient reports that he is active with Home Health agency from [facility])           Social Determinants of Health (SDOH) Interventions SDOH Screenings   Food Insecurity: No Food Insecurity (07/10/2023)  Housing: Low Risk  (07/10/2023)  Transportation Needs: No Transportation Needs (07/10/2023)  Utilities: Not At Risk (07/10/2023)  Financial Resource Strain: Low Risk  (04/19/2023)   [facility] from [facility]  Tobacco Use: Medium Risk (07/11/2023)    Readmission Risk Interventions    07/12/2023   10:17 AM 08/03/2021    9:29 AM 11/15/2020   11:36 AM  Readmission Risk Prevention Plan  Transportation Screening Complete Complete Complete  PCP or Specialist Appt within 3-5 Days Complete  Not Complete  Not Complete comments   Pending discharge, unit clerk will schedule.  HRI or Home Care Consult Complete  Complete  Social Work Consult for Recovery Care Planning/Counseling Complete  Complete  Palliative Care Screening Not Applicable  Not Applicable  Medication Review Oceanographer) Complete Complete Complete  PCP or Specialist appointment within 3-5 days of discharge  Complete   HRI or Home Care Consult  Complete   SW Recovery Care/Counseling Consult  Complete  Palliative Care Screening  Not Applicable   Skilled Nursing Facility  Complete

## 2023-07-12 NOTE — Evaluation (Signed)
Physical Therapy Evaluation Patient Details Name: Christian Fields MRN: [id number] DOB: 11-21-1956 Today's Date: 07/12/2023  History of Present Illness  Pt is a 67 y.o. male presenting to hospital 07/10/23 as direct admission from podiatry clinic.  Pt admitted with cellulitis of L LE with findings of early osteomyelitis, diabetic foot ulcer, PAD.  Pt with osteomyelitis L calcaneus and full-thickness ulcer to calcaneus L heel.  Pt s/p 07/11/23 left foot incision and drainage with partial calcanectomy with rotational skin flap closure.  PMH includes anemia, anxiety, asthma, CA, chronic back pain, COPD on home O2, DM, gout, HA, h/o blood clots, h/o kidney stones, neuropathy, PNA, sleep apnea, foot ulcer, chronic pain syndrome.  Clinical Impression  Prior to hospital admission, pt reports being independent with functional mobility; plans to discharge to his daughter's home.  0/10 pain during session.  Currently pt is modified independent with bed mobility and SBA lateral scooting on edge of bed.  Pt did well maintaining WB'ing precautions.  Pt declined further activity d/t c/o feeling sick on stomach/nauseas (pt reports feeling this way prior to mobility)--nurse present end of session and notified.  Pt reports MD recommended he use knee scooter so his daughter picked up knee scooter for him to use upon hospital discharge (pt reports he has used a knee scooter before).  Pt would currently benefit from skilled PT to address noted impairments and functional limitations (see below for any additional details).  Upon hospital discharge, pt would benefit from ongoing therapy.     If plan is discharge home, recommend the following: A little help with walking and/or transfers;A little help with bathing/dressing/bathroom;Assistance with cooking/housework;Assist for transportation;Help with stairs or ramp for entrance   Can travel by private vehicle        Equipment Recommendations Rolling walker (2 wheels);Wheelchair  (measurements PT);Wheelchair cushion (measurements PT);BSC/3in1  Recommendations for Other Services  OT consult    Functional Status Assessment Patient has had a recent decline in their functional status and demonstrates the ability to make significant improvements in function in a reasonable and predictable amount of time.     Precautions / Restrictions Precautions Precautions: Fall Restrictions Weight Bearing Restrictions: Yes LLE Weight Bearing: Non weight bearing Other Position/Activity Restrictions: Strict NWB L foot      Mobility  Bed Mobility Overal bed mobility: Modified Independent             General bed mobility comments: Semi-supine to/from sitting without any noted difficulties    Transfers Overall transfer level: Needs assistance Equipment used: None Transfers: Bed to chair/wheelchair/BSC            Lateral/Scoot Transfers: Supervision General transfer comment: lateral scoot to L along bed a few trials without any noted difficulties; pt declined further activity d/t feeling sick on his stomach/nauseas    Ambulation/Gait                  Stairs            Wheelchair Mobility     Tilt Bed    Modified Rankin (Stroke Patients Only)       Balance Overall balance assessment: Needs assistance Sitting-balance support: No upper extremity supported, Feet unsupported Sitting balance-Leahy Scale: Good Sitting balance - Comments: steady reaching within BOS                                     Pertinent Vitals/Pain Pain Assessment Pain  Assessment: No/denies pain Vitals (HR and SpO2 on 3 L via nasal cannula) stable and WFL throughout treatment session.    Home Living Family/patient expects to be discharged to:: Private residence Living Arrangements: Children (Plans to discharge to his daughter's home) Available Help at Discharge: Family;Available PRN/intermittently Type of Home: House Home Access: Stairs to  enter Entrance Stairs-Rails:  (unsure how many railings but thinks there is at least 1) Entrance Stairs-Number of Steps: 2-3   Home Layout: Two level;Able to live on main level with bedroom/bathroom Home Equipment: Rollator (4 wheels);Standard Walker;Cane - single point;BSC/3in1;Shower seat (3ww; daughter picked up knee scooter per MD recommendation) Additional Comments: 3 L home O2 use    Prior Function Prior Level of Function : Independent/Modified Independent             Mobility Comments: Independent with ambulation; no recent falls reported       Hand Dominance        Extremity/Trunk Assessment   Upper Extremity Assessment Upper Extremity Assessment: Overall WFL for tasks assessed    Lower Extremity Assessment Lower Extremity Assessment: Generalized weakness       Communication   Communication: No difficulties  Cognition Arousal/Alertness: Awake/alert Behavior During Therapy: WFL for tasks assessed/performed Overall Cognitive Status: Within Functional Limits for tasks assessed                                          General Comments General comments (skin integrity, edema, etc.): L foot dressing in place.  Nursing cleared pt for participation in physical therapy.  Pt agreeable to PT session.    Exercises     Assessment/Plan    PT Assessment Patient needs continued PT services  PT Problem List Decreased strength;Decreased activity tolerance;Decreased balance;Decreased mobility;Decreased knowledge of use of DME;Decreased knowledge of precautions;Decreased skin integrity;Pain       PT Treatment Interventions DME instruction;Gait training;Stair training;Functional mobility training;Therapeutic activities;Therapeutic exercise;Balance training;Patient/family education    PT Goals (Current goals can be found in the Care Plan section)  Acute Rehab PT Goals Patient Stated Goal: to discharge to his daughter's home PT Goal Formulation: With  patient Time For Goal Achievement: 07/26/23 Potential to Achieve Goals: Good    Frequency 7X/week     Co-evaluation               AM-PAC PT "6 Clicks" Mobility  Outcome Measure Help needed turning from your back to your side while in a flat bed without using bedrails?: None Help needed moving from lying on your back to sitting on the side of a flat bed without using bedrails?: None Help needed moving to and from a bed to a chair (including a wheelchair)?: A Little Help needed standing up from a chair using your arms (e.g., wheelchair or bedside chair)?: A Little Help needed to walk in hospital room?: A Lot Help needed climbing 3-5 steps with a railing? : Total 6 Click Score: 17    End of Session Equipment Utilized During Treatment: Oxygen (3 L via nasal cannula) Activity Tolerance:  (limited d/t nausea/feeling sick on his stomach--nurse notified) Patient left: in bed;with call bell/phone within reach;with bed alarm set;with nursing/sitter in room;Other (comment) (L LE elevated on pillow) Nurse Communication: Mobility status;Precautions;Other (comment) (Pt feeling nauseas/sick on his stomach (even prior to mobility)) PT Visit Diagnosis: Other abnormalities of gait and mobility (R26.89);Muscle weakness (generalized) (M62.81);Pain Pain - Right/Left: Left  Pain - part of body: Ankle and joints of foot    Time: 1454-1511 PT Time Calculation (min) (ACUTE ONLY): 17 min   Charges:   PT Evaluation $PT Eval Low Complexity: 1 Low PT Treatments $Therapeutic Activity: 8-22 mins PT General Charges $$ ACUTE PT VISIT: 1 Visit        Hendricks Limes, PT 07/12/23, 5:16 PM

## 2023-07-13 DIAGNOSIS — M86172 Other acute osteomyelitis, left ankle and foot: Secondary | ICD-10-CM | POA: Diagnosis not present

## 2023-07-13 LAB — CBC WITH DIFFERENTIAL/PLATELET
Abs Immature Granulocytes: 0.05 10*3/uL (ref 0.00–0.07)
Basophils Absolute: 0.1 10*3/uL (ref 0.0–0.1)
Basophils Relative: 1 %
Eosinophils Absolute: 0.6 10*3/uL — ABNORMAL HIGH (ref 0.0–0.5)
Eosinophils Relative: 7 %
HCT: 30.4 % — ABNORMAL LOW (ref 39.0–52.0)
Hemoglobin: 9.9 g/dL — ABNORMAL LOW (ref 13.0–17.0)
Immature Granulocytes: 1 %
Lymphocytes Relative: 19 %
Lymphs Abs: 1.6 10*3/uL (ref 0.7–4.0)
MCH: 31.6 pg (ref 26.0–34.0)
MCHC: 32.6 g/dL (ref 30.0–36.0)
MCV: 97.1 fL (ref 80.0–100.0)
Monocytes Absolute: 0.5 10*3/uL (ref 0.1–1.0)
Monocytes Relative: 5 %
Neutro Abs: 5.7 10*3/uL (ref 1.7–7.7)
Neutrophils Relative %: 67 %
Platelets: 252 10*3/uL (ref 150–400)
RBC: 3.13 MIL/uL — ABNORMAL LOW (ref 4.22–5.81)
RDW: 15 % (ref 11.5–15.5)
WBC: 8.5 10*3/uL (ref 4.0–10.5)
nRBC: 0 % (ref 0.0–0.2)

## 2023-07-13 LAB — CREATININE, SERUM
Creatinine, Ser: 0.89 mg/dL (ref 0.61–1.24)
GFR, Estimated: >60 mL/min (ref >60)

## 2023-07-13 LAB — GLUCOSE, CAPILLARY
Glucose-Capillary: 99 mg/dL (ref 70–99)
Glucose-Capillary: 137 mg/dL — ABNORMAL HIGH (ref 70–99)
Glucose-Capillary: 134 mg/dL — ABNORMAL HIGH (ref 70–99)
Glucose-Capillary: 115 mg/dL — ABNORMAL HIGH (ref 70–99)

## 2023-07-13 MED ORDER — VANCOMYCIN HCL 1500 MG/300ML IV SOLN
1500.0000 mg | INTRAVENOUS | Status: DC
  Administered 2023-07-13: 1500 mg via INTRAVENOUS
  Filled 2023-07-13 (×2): qty 300

## 2023-07-13 NOTE — Plan of Care (Signed)
  Problem: Education: Goal: Knowledge of General Education information will improve Description: Including pain rating scale, medication(s)/side effects and non-pharmacologic comfort measures Outcome: Progressing   Problem: Activity: Goal: Risk for activity intolerance will decrease Outcome: Progressing   Problem: Nutrition: Goal: Adequate nutrition will be maintained Outcome: Progressing   Problem: Coping: Goal: Level of anxiety will decrease Outcome: Progressing   Problem: Elimination: Goal: Will not experience complications related to bowel motility Outcome: Progressing Goal: Will not experience complications related to urinary retention Outcome: Progressing   Problem: Pain Managment: Goal: General experience of comfort will improve Outcome: Progressing   Problem: Safety: Goal: Ability to remain free from injury will improve Outcome: Progressing   Problem: Skin Integrity: Goal: Risk for impaired skin integrity will decrease Outcome: Progressing   Problem: Skin Integrity: Goal: Risk for impaired skin integrity will decrease Outcome: Progressing

## 2023-07-13 NOTE — Progress Notes (Signed)
Physical Therapy Treatment Patient Details Name: Christian Fields MRN: [id number] DOB: 1956-04-24 Today's Date: 07/13/2023   History of Present Illness Pt is a 67 y.o. male presenting to hospital 07/10/23 as direct admission from podiatry clinic.  Pt admitted with cellulitis of L LE with findings of early osteomyelitis, diabetic foot ulcer, PAD.  Pt with osteomyelitis L calcaneus and full-thickness ulcer to calcaneus L heel.  Pt s/p 07/11/23 left foot incision and drainage with partial calcanectomy with rotational skin flap closure.  PMH includes anemia, anxiety, asthma, CA, chronic back pain, COPD on home O2, DM, gout, HA, h/o blood clots, h/o kidney stones, neuropathy, PNA, sleep apnea, foot ulcer, chronic pain syndrome.    PT Comments  Bed mobility with out assist.  Steady in sitting.  Dons pants for knee scooter.  Stands to RW and is able to pull up pants with min assist.  Transitions to knee scooter with min a x 1 and is able to complete x 1 lap on unit with contact guard.  He does have trouble backing up scooter and eventually leans over scooter and climbs into bed despite cues for safety.  Pt stated he was SOB.  Sats 77% on 3 lpm and HR 128.  Stated he does use O2 at home and 89% is baseline for him.  He does increase with rest and deep breathing.  Stated he does breathing treatments 4x and 2x a day at home which he has not been doing here.  Recalls albuterol but is unsure of other medication and which frequency is which.  Will relay to team.  He does not voice further questions.  Stated daughter has purchased a knee scooter and has it at home.  While pt did fair with it, he does require +1 for safety.  Given safety and O2 needs, it is recommended he rely on wheelchair for longer distances in community and even at home.  Pt agrees this would be better for him.  He does do well with NWB for transitions.  Patient suffers from L foot osteomyelitis/ NWB which impairs his/her ability to perform daily  activities like toileting, feeding, dressing, grooming, bathing in the home. A cane, walker, crutch will not resolve the patient's issue with performing activities of daily living. A lightweight wheelchair and cushion is required/recommended and will allow patient to safely perform daily activities.   Patient can safely propel the wheelchair in the home or has a caregiver who can provide assistance.     If plan is discharge home, recommend the following: A little help with walking and/or transfers;A little help with bathing/dressing/bathroom;Assistance with cooking/housework;Assist for transportation;Help with stairs or ramp for entrance   Can travel by private vehicle        Equipment Recommendations  Rolling walker (2 wheels);Wheelchair (measurements PT);Wheelchair cushion (measurements PT);BSC/3in1    Recommendations for Other Services OT consult     Precautions / Restrictions Precautions Precautions: Fall Restrictions Weight Bearing Restrictions: Yes LLE Weight Bearing: Non weight bearing Other Position/Activity Restrictions: Strict NWB L foot     Mobility  Bed Mobility Overal bed mobility: Modified Independent               Patient Response: Cooperative  Transfers Overall transfer level: Needs assistance Equipment used: Rolling walker (2 wheels) Transfers: Sit to/from Stand Sit to Stand: Min guard                Ambulation/Gait Ambulation/Gait assistance: Min guard Gait Distance (Feet): 160 Feet Assistive device:  (knee  scooter) Gait Pattern/deviations: Step-to pattern Gait velocity: WFL     General Gait Details: generally steady with +1 assist knee scooter but poor on/off and ability to back up   Stairs             Wheelchair Mobility     Tilt Bed Tilt Bed Patient Response: Cooperative  Modified Rankin (Stroke Patients Only)       Balance Overall balance assessment: Needs assistance Sitting-balance support: No upper extremity  supported, Feet unsupported Sitting balance-Leahy Scale: Good     Standing balance support: Single extremity supported, Bilateral upper extremity supported, Reliant on assistive device for balance Standing balance-Leahy Scale: Fair Standing balance comment: knee scooter                            Cognition Arousal/Alertness: Awake/alert Behavior During Therapy: WFL for tasks assessed/performed, Impulsive Overall Cognitive Status: Within Functional Limits for tasks assessed                                          Exercises      General Comments        Pertinent Vitals/Pain Pain Assessment Pain Assessment: No/denies pain    Home Living                          Prior Function            PT Goals (current goals can now be found in the care plan section) Progress towards PT goals: Progressing toward goals    Frequency    7X/week      PT Plan Current plan remains appropriate    Co-evaluation              AM-PAC PT "6 Clicks" Mobility   Outcome Measure  Help needed turning from your back to your side while in a flat bed without using bedrails?: None Help needed moving from lying on your back to sitting on the side of a flat bed without using bedrails?: None Help needed moving to and from a bed to a chair (including a wheelchair)?: A Little Help needed standing up from a chair using your arms (e.g., wheelchair or bedside chair)?: A Little Help needed to walk in hospital room?: A Little Help needed climbing 3-5 steps with a railing? : Total 6 Click Score: 18    End of Session Equipment Utilized During Treatment: Oxygen;Gait belt (3 L via nasal cannula) Activity Tolerance: Patient tolerated treatment well (limited d/t nausea/feeling sick on his stomach--nurse notified) Patient left: in bed;with call bell/phone within reach;with bed alarm set;Other (comment) (L LE elevated on pillow) Nurse Communication: Mobility  status;Precautions;Other (comment) (Pt feeling nauseas/sick on his stomach (even prior to mobility)) PT Visit Diagnosis: Other abnormalities of gait and mobility (R26.89);Muscle weakness (generalized) (M62.81);Pain Pain - Right/Left: Left Pain - part of body: Ankle and joints of foot     Time: 1500-1517 PT Time Calculation (min) (ACUTE ONLY): 17 min  Charges:    $Gait Training: 8-22 mins PT General Charges $$ ACUTE PT VISIT: 1 Visit                   Danielle Dess, PTA 07/13/23, 3:27 PM

## 2023-07-13 NOTE — Plan of Care (Signed)

## 2023-07-13 NOTE — Progress Notes (Signed)
Pharmacy Antibiotic Note  Christian Fields is a 67 y.o. male admitted on 07/10/2023 with a diabetic foot infection concerning for early osteomyelitis. Patient has an extensive past medical history which consists of T2DM, HTN, HLD, CKD stage 2, chronic diastolic HF, asthma, PAD, TIA, GERD, gout, depression, OSA not on CPAP, anemia, AAA, polysubstance abuse, chronic pain syndrome (Suboxone), non-small cell lung cancer (s/p of RXT), and COPD on 2-3 L oxygen. He was recently admitted from 7/24-7/26 where he received treatment with ceftriaxone and vancomycin. Patient deferred the surgical interventions that were recommended and was discharged on a 10-day course of Augmentin and doxycycline. After following up with his outpatient podiatrist, he was readmitted after infection was found to be worsening. Pharmacy has been consulted for Cefepime & Vancomycin dosing.  Plan: Scr improved 1.20>>0.93>>0.89   Will continue cefepime 2 gm to [facility]   for Crcl 68.8 ml/min Will adjust vancomycin to 1500mg  Q24H. Goal AUC 400-550 Expected AUC 553 Expected Cmin 11.1 Scr 0.89, TBW, Vd 0.72  Also on metronidazole 500 mg IV Q12H F/u renal fxn, cultures, LOT  Temp (24hrs), Avg:98.1 F (36.7 C), Min:97.9 F (36.6 C), Max:98.3 F (36.8 C)  Recent Labs  Lab 07/10/23 1839 07/11/23 0312 07/12/23 0841 07/13/23 0653  WBC 12.8* 11.2* 11.2* 8.5  CREATININE 1.25* 1.20 0.93 0.89    Estimated Creatinine Clearance: 68.8 mL/min (by C-G formula based on SCr of 0.89 mg/dL).    Allergies  Allergen Reactions   Bee Venom Anaphylaxis   Antimicrobials this admission: 08/01 Cefepime >>  08/01 Vancomycin >>  08/01 Flagyl >>   Microbiology results: 08/01 BCx: NG x2d 08/02 Wound Cx: NG,24hr 8/1 wound cx: GPC: ABUNDANT STAPHYLOCOCCUS HAEMOLYTICUS -  reincubated MRSA PCR neg  Thank you for allowing pharmacy to be a part of this patient's care.  Bari Mantis PharmD Clinical Pharmacist 07/13/2023

## 2023-07-13 NOTE — Progress Notes (Signed)
PT Cancellation Note  Patient Details Name: Christian Fields MRN: [id number] DOB: 1956-04-19   Cancelled Treatment:    Reason Eval/Treat Not Completed: Other (comment).  Therapist brought knee scooter to trial but pt reported he did not want to try knee scooter wearing a gown and reported his daughter would be bringing him clothes later today.  Discussed working on other functional mobility but pt declined d/t having a headache.  Nurse notified of pt's HA and updated on above.  Will re-attempt PT session at a later date/time.  Hendricks Limes, PT 07/13/23, 11:32 AM

## 2023-07-13 NOTE — Progress Notes (Signed)
Progress Note   Patient: Christian Fields ZOX:[id number] DOB: 29-Dec-1955 DOA: 07/10/2023     3 DOS: the patient was seen and examined on 07/13/2023     Subjective:  Patient seen and examined at bedside this morning He denied worsening pain in the foot Culture results reviewed showing findings of Staphylococcus aureus with sensitivity pending Denies chest pain abdominal pain or cough     Brief hospital course:  Christian Fields is a 67 y.o. male with medical history significant of  Chronic hypoxic respiratory failure on 3 L of intranasal oxygen, COPD, hypertension, hyperlipidemia, diabetes mellitus, asthma, TIA, GERD, gout, depression, OSA not on CPAP, polysubstance abuse, chronic pain syndrome on Suboxone, non-small cell lung cancer s/p treatment, tobacco abuse, CKD stage II who was recently admitted here from July 24 to July 26 for cellulitis of left lower extremity received broad-spectrum antibiotics.  During that hospitalization podiatry had recommended surgical intervention however patient opted out, refused and decided to follow-up as an outpatient.  Patient apparently saw podiatry 2 days ago and was found to have worsening of infection with possible underlying osteomyelitis.  Surgical intervention was discussed with patient who agreed to come in today for surgical intervention tomorrow.  Patient is being seen as a direct admission from podiatry clinic. Patient denies nausea vomiting abdominal pain chest pain cough or urinary complaint.     Assessment and Plan: Cellulitis of left lower extremity with findings of early osteomyelitis Diabetic foot ulcer.   Patient comes in as a direct admission from podiatry office  Previous imaging obtained about a week ago showing findings of early osteomyelitis of calcaneus.   Pharmacist on board for vancomycin dosing Still requiring antibiotics awaiting culture results Podiatry on board we appreciate input Continue to follow-up on sensitivity  results Infectious disease on board we appreciate input  Culture results reviewed showing findings of Staphylococcus aureus with sensitivity pending   Peripheral arterial disease (HCC) Continue aspirin and statin Plavix resumed   CKD stage 2 chronic kidney disease (HCC) Creatinine currently at baseline Continue to monitor   Chronic diastolic CHF (congestive heart failure) (HCC)  2D echo on 04/21/2023 showed EF> 55%.   Monitor input and output Daily weight Because of hypotension we will hold off antihypertensives at this time   Type 2 diabetes mellitus with foot ulcer (CODE) (HCC) Placated by diabetic neuropathy Placed on insulin therapy Monitor glucose level closely Continue gabapentin adjusted for renal clearance I explained to patient why he cannot get the 800 mg 3 times daily as advised by pharmacist and so currently patient is on 600 mg dosage   HLD (hyperlipidemia) Continue statin therapy   Hypertension Continue holding off antihypertensives at this time on account of relative hypotension   Chronic pain syndrome Continue Suboxone   GERD-continue PPI therapy   COPD (chronic obstructive pulmonary disease) (HCC) Continue oxygen requirement Maintain appropriate saturation Continue as needed nebulization   Depression with anxiety Continue home medication   Sleep apnea Patient does not use CPAP at home   DVT prophylaxis-continue Lovenox    Advance Care Planning:   Code Status: Prior full   Consults: Podiatry   Family Communication: Discussed with patient's daughter present at bedside       Physical Exam: General assessment: Laying in bed in no acute distress CNS: Alert and oriented x 3 nonfocal Musculoskeletal: Dressing noted to the left leg is clean and dry Respiratory system: Clear to auscultation bilaterally    Data Reviewed: I have reviewed patient's cultures, labs,  nursing documentation.Vitals      Vitals:   07/12/23 1730 07/12/23 2354 07/13/23  0516 07/13/23 0750  BP: 103/66 96/61  (!) 145/75  Pulse: 84 87  89  Resp: 15 18  18   Temp: 98.1 F (36.7 C) 97.9 F (36.6 C)  98.3 F (36.8 C)  TempSrc:    Oral  SpO2: 94% (!) 83% 92% 95%  Weight:      Height:         Author: Loyce Dys, MD 07/13/2023 2:55 PM  For on call review [email or web address].

## 2023-07-14 ENCOUNTER — Other Ambulatory Visit: Payer: Self-pay

## 2023-07-14 ENCOUNTER — Encounter: Payer: Self-pay | Admitting: Oncology

## 2023-07-14 ENCOUNTER — Other Ambulatory Visit (HOSPITAL_COMMUNITY): Payer: Self-pay

## 2023-07-14 DIAGNOSIS — E11622 Type 2 diabetes mellitus with other skin ulcer: Secondary | ICD-10-CM | POA: Diagnosis not present

## 2023-07-14 DIAGNOSIS — L97529 Non-pressure chronic ulcer of other part of left foot with unspecified severity: Secondary | ICD-10-CM | POA: Diagnosis not present

## 2023-07-14 DIAGNOSIS — E1169 Type 2 diabetes mellitus with other specified complication: Secondary | ICD-10-CM | POA: Diagnosis not present

## 2023-07-14 DIAGNOSIS — M86172 Other acute osteomyelitis, left ankle and foot: Secondary | ICD-10-CM | POA: Diagnosis not present

## 2023-07-14 LAB — GLUCOSE, CAPILLARY
Glucose-Capillary: 85 mg/dL (ref 70–99)
Glucose-Capillary: 88 mg/dL (ref 70–99)
Glucose-Capillary: 108 mg/dL — ABNORMAL HIGH (ref 70–99)

## 2023-07-14 MED ORDER — LINEZOLID 600 MG PO TABS
600.0000 mg | ORAL_TABLET | Freq: Two times a day (BID) | ORAL | 0 refills | Status: DC
  Filled 2023-07-14: qty 28, 14d supply, fill #0

## 2023-07-14 MED ORDER — VANCOMYCIN HCL 750 MG/150ML IV SOLN
750.0000 mg | Freq: Two times a day (BID) | INTRAVENOUS | Status: DC
  Filled 2023-07-14: qty 150

## 2023-07-14 MED ORDER — METOPROLOL SUCCINATE ER 50 MG PO TB24
50.0000 mg | ORAL_TABLET | Freq: Every day | ORAL | Status: DC
  Administered 2023-07-14: 50 mg via ORAL
  Filled 2023-07-14: qty 1

## 2023-07-14 MED ORDER — CIPROFLOXACIN HCL 500 MG PO TABS
500.0000 mg | ORAL_TABLET | Freq: Two times a day (BID) | ORAL | 0 refills | Status: AC
  Filled 2023-07-14: qty 28, 14d supply, fill #0

## 2023-07-14 MED ORDER — GABAPENTIN 400 MG PO CAPS
800.0000 mg | ORAL_CAPSULE | Freq: Four times a day (QID) | ORAL | Status: DC
  Administered 2023-07-14 (×2): 800 mg via ORAL
  Filled 2023-07-14 (×2): qty 2

## 2023-07-14 NOTE — Anesthesia Postprocedure Evaluation (Signed)
Anesthesia Post Note  Patient: Christian Fields  Procedure(s) Performed: IRRIGATION AND DEBRIDEMENT FOOT (Left)  Patient location during evaluation: PACU Anesthesia Type: MAC Level of consciousness: awake and alert Pain management: pain level controlled Vital Signs Assessment: post-procedure vital signs reviewed and stable Respiratory status: spontaneous breathing, nonlabored ventilation, respiratory function stable and patient connected to nasal cannula oxygen Cardiovascular status: blood pressure returned to baseline and stable Postop Assessment: no apparent nausea or vomiting Anesthetic complications: no   No notable events documented.   Last Vitals:  Vitals:   07/13/23 2320 07/14/23 0730  BP: 134/79 128/73  Pulse: 92 90  Resp: 20 18  Temp: 36.6 C 36.9 C  SpO2: 94% 100%    Last Pain:  Vitals:   07/13/23 2210  TempSrc:   PainSc: 5                  Yevette Edwards

## 2023-07-14 NOTE — Discharge Summary (Signed)
Physician Discharge Summary   Patient: Christian Fields MRN: [id number] DOB: 1956/02/17  Admit date:     07/10/2023  Discharge date: 07/14/23  Discharge Physician: Loyce Dys   PCP: Alease Medina, MD   Recommendations at discharge:  Wound care instruction:   Xeroform to the incision line followed by 4 x 4 gauze, ABD pads, Kerlix, Ace wrap.  Recommend dressing changes every 2 to 3 days. -Patient remain nonweightbearing at all times left lower extremity, stressed importance  Discharge Diagnoses:  Cellulitis of left lower extremity with findings of early osteomyelitis Diabetic foot ulcer.   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 male with medical history significant of  Chronic hypoxic respiratory failure on 3 L of intranasal oxygen, COPD, hypertension, hyperlipidemia, diabetes mellitus, asthma, TIA, GERD, gout, depression, OSA not on CPAP, polysubstance abuse, chronic pain syndrome on Suboxone, non-small cell lung cancer s/p treatment, tobacco abuse, CKD stage II who was recently admitted here from July 24 to July 26 for cellulitis of left lower extremity received broad-spectrum antibiotics.  During that hospitalization podiatry had recommended surgical intervention however patient opted out, refused and decided to follow-up as an outpatient.  Patient apparently saw podiatry 2 days prior to admission and was found to have worsening of infection with possible underlying osteomyelitis.  Surgical intervention was discussed with patient who agreed.  Patient underwent debridement with bone biopsy.   Wound culture results growing multidrug-resistant Staphylococcus.  Infectious disease has seen patient with recommendation of antibiotic therapy and to follow-up with podiatry as well as ID outpatient    Consultants: Podiatry, infectious disease Procedures performed: As noted above Disposition: Home Diet recommendation:  Cardiac diet DISCHARGE MEDICATION: Allergies as of 07/14/2023       Reactions   Bee Venom Anaphylaxis        Medication List     STOP taking these medications    amoxicillin-clavulanate 875-125 MG tablet Commonly known as: AUGMENTIN   busPIRone 7.5 MG tablet Commonly known as: BUSPAR   doxycycline 100 MG tablet Commonly known as: VIBRA-TABS   hydrochlorothiazide 12.5 MG tablet Commonly known as: HYDRODIURIL       TAKE these medications    acetaminophen 325 MG tablet Commonly known as: TYLENOL Take 650 mg by mouth every 6 (six) hours as needed.   albuterol 108 (90 Base) MCG/ACT inhaler Commonly known as: VENTOLIN HFA Inhale 2 puffs into the lungs every 4 (four) hours as needed for wheezing or shortness of breath.   albuterol (2.5 MG/3ML) 0.083% nebulizer solution Commonly known as: PROVENTIL Take 2.5 mg by nebulization every 4 (four) hours as needed.   allopurinol 300 MG tablet Commonly known as: ZYLOPRIM Take 600 mg by mouth daily.   aspirin EC 81 MG tablet Take 1 tablet by mouth daily.   azelastine 0.1 % nasal spray Commonly known as: ASTELIN Place 2 sprays into both nostrils 2 (two) times daily.   buprenorphine-naloxone 8-2 mg Subl SL tablet Commonly known as: SUBOXONE Place 1 tablet under the tongue in the morning, at noon, and at bedtime.   cholecalciferol 1000 units tablet Commonly known as: VITAMIN D Take 1,000 Units by mouth daily.   ciprofloxacin 500  MG tablet Commonly known as: Cipro Take 1 tablet (500 mg total) by mouth 2 (two) times daily for 14 days.   clopidogrel 75 MG tablet Commonly known as: PLAVIX Take 1  tablet (75 mg total) by mouth daily.   cyanocobalamin 1000 MCG/ML injection Commonly known as: VITAMIN B12 Inject 1 mL (1,000 mcg total) into the muscle every 30 (thirty) days.   DULoxetine 30 MG capsule Commonly known as: CYMBALTA Take 1 capsule by mouth daily.   ezetimibe 10 MG tablet Commonly known as: ZETIA Take 10 mg by mouth daily.   Farxiga 10 MG Tabs tablet Generic drug: dapagliflozin propanediol Take 10 mg by mouth daily.   fluticasone 50 MCG/ACT nasal spray Commonly known as: FLONASE Place 1 spray into both nostrils daily as needed for allergies.   gabapentin 800 MG tablet Commonly known as: NEURONTIN Take 800 mg by mouth 4 (four) times daily.   linezolid 600 MG tablet Commonly known as: ZYVOX Take 1 tablet (600 mg total) by mouth 2 (two) times daily.   metFORMIN 1000 MG tablet Commonly known as: GLUCOPHAGE Take 1,000 mg by mouth 2 (two) times daily with a meal.   metoprolol succinate 50 MG 24 hr tablet Commonly known as: TOPROL-XL Take 50 mg by mouth daily.   ondansetron 4 MG disintegrating tablet Commonly known as: ZOFRAN-ODT Take 4 mg by mouth every 8 (eight) hours as needed for nausea.   OXYGEN Inhale 2 L into the lungs.   pantoprazole 40 MG tablet Commonly known as: PROTONIX Take 40 mg by mouth daily.   roflumilast 500 MCG Tabs tablet Commonly known as: DALIRESP Take 500 mcg by mouth daily.   simvastatin 40 MG tablet Commonly known as: ZOCOR Take 1 tablet by mouth daily.   SYRINGE 3CC/25GX1" 25G X 1" 3 ML Misc 1 Syringe by Does not apply route every 30 (thirty) days.   Trelegy Ellipta 100-62.5-25 MCG/ACT Aepb Generic drug: Fluticasone-Umeclidin-Vilant Inhale into the lungs.        Follow-up Information     Gwyneth Revels, DPM. Schedule an appointment as soon as possible for a visit in 2 week(s).   Specialty: Podiatry Why: For wound re-check Contact information: [address] [place] [address] [phone]                 Discharge Exam: [name] Weights   07/10/23 1843  Weight: 61.2 kg   General assessment: Laying in bed in no acute distress CNS: Alert and oriented x 3 nonfocal Musculoskeletal: Dressing noted to the left leg is clean and dry Respiratory system: Clear to auscultation bilaterally  Condition at discharge: good  Discharge time spent:  37 minutes.  Signed: Loyce Dys, MD [facility] 07/14/2023

## 2023-07-14 NOTE — Consult Note (Signed)
Pharmacy Antibiotic Note  TEL CATTON is a 67 y.o. male admitted on 07/10/2023 with  MRI confirmed osteomyelitis of L foot . They were previously hospitalized on July 24 - 26 for cellulitis of the left lower extremity. Patient opted out of recommended surgical intervention so was given doxycycline 100 mg BID and Augmentin 875 x 10 days. 2 days ago patient was found to have osteomyelitis by podiatry and was directly admitted for surgical intervention. Underwent I&D (8/2). Patient also had an AKI on admission which has since resolved. Pharmacy has been consulted for vancomycin and cefepime dosing.  Plan: Vancomycin 750 IV every 12 hours.  Goal trough 10-15 mcg/mL. Day 4 of antibiotics Give vancomycin 750 mg IV Q12H Goal AUC 400-550. Expected AUC: 504.9 Expected Css min: 14.6 SCr used: 0.81  Weight used: IBW, Vd used: 0.72 (BMI 22.47) Continue to monitor renal function and follow culture results   Height: 5\' 5"  (165.1 cm) Weight: 61.2 kg (135 lb) IBW/kg (Calculated) : 61.5  Temp (24hrs), Avg:98 F (36.7 C), Min:97.6 F (36.4 C), Max:98.5 F (36.9 C)  Recent Labs  Lab 07/10/23 1839 07/11/23 0312 07/12/23 0841 07/13/23 0653 07/14/23 0535  WBC 12.8* 11.2* 11.2* 8.5 9.9  CREATININE 1.25* 1.20 0.93 0.89 0.81    Estimated Creatinine Clearance: 75.6 mL/min (by C-G formula based on SCr of 0.81 mg/dL).    Allergies  Allergen Reactions   Bee Venom Anaphylaxis    Antimicrobials this admission: 8/1 Cefepime 2g Q12H >> 8/3 8/3 Cefepime 2 g Q8H >>  8/1 Metronidazole 500 mg Q12H >>   Dose adjustments this admission: 8/2 Vancomycin 1250 Q24H >> 8/4 8/4 Vancomycin 1500 Q24H >> 8/5  Microbiology results: 8/1 BCx: NGTD 8/2 BCx: NGTD  8/1 Wound: STAPHYLOCOCCUS HAEMOLYTICUS ; MODERATE CORYNEBACTERIUM STRIATUM   8/2 Wound: NGTD 8/2 MRSA PCR: negative  Thank you for allowing pharmacy to be a part of this patient's care.  Effie Shy 07/14/2023 3:56 PM

## 2023-07-14 NOTE — TOC Benefit Eligibility Note (Signed)
Pharmacy Patient Advocate Encounter  Insurance verification completed.    The patient is insured through [facility] Medicare Part D  Ran test claim for linezolid 600 mg tablets and the current 14 day co-pay is $0.00.  Ran test claim for ciprofloxacin 500 mg tablets and the current 14 day co-pay is $0.00.  This test claim was processed through [facility]- copay amounts may vary at other pharmacies due to pharmacy/plan contracts, or as the patient moves through the different stages of their insurance plan.    Roland Earl, CPHT Pharmacy Patient Advocate Specialist [facility] Patient Advocate Team Direct Number: [phone]  Fax: [phone]

## 2023-07-14 NOTE — Plan of Care (Signed)
Patient discharged per MD orders at this time.All dc instructions,education and medications reviewed with the patient.Pt expressed understanding and will comply with dc instructions.follow up appointments was also communicated to the Pt.no verbal c/o or any ssx of distress.Pt was discharged home with self-care per order.Pt was transported home by daughter in privately owned vehicle.

## 2023-07-14 NOTE — Discharge Instructions (Addendum)
Podiatry discharge instructions: 1.  Remain nonweightbearing at all times left lower extremity. 2.  Change dressing to left foot every 2 to 3 days consisting of removing dressing.  Apply Betadine to the area of the incision followed by nonadherent gauze such as Adaptic or Xeroform, 4 x 4 gauze, ABD pad, Kerlix, Ace wrap. 3.  Take antibiotics as prescribed until gone. 4.  Make follow-up appoint with Dr. Ether Griffins within 2 weeks of discharge date in outpatient clinic.   While on the antibiotic linezolid avoid or minimize following foods and drinks as they may interact with linezolid.  These foods and drinks contain tyramine.  Tyramine is a natural product found in some plants and animals.  Too much tyramine in combination with linezolid can cause high levels of serotonin in the body.  Serotonin is a chemical in our body that controls mood, sleep, digestion, and other functions.  Several signs of too much serotonin in the body (also called serotonin syndrome) are fast heart rate, sweating, fevers, high blood pressure, muscle twitching, and confusion.  It is important to seek medical attention if you have these symptoms.   Avoid foods and beverages that are high in tyramine while taking linezolid, including: Alcoholic beverages (such as beer, red wine, vermouth, sherry) Aged cheeses (such as cheddar, blue cheese, swiss, feta, parmesan, camembert) Fermented or pickled foods (such as sauerkraut, pickled beets, pickled peppers, pickled cucumbers/pickles, kim chee/kimchi)  Dried/aged, smoked or processed meats and sausages (such as salami, pepperoni, liverwurst, hot dogs, bologna, bacon) Soybean products (such as soy sauce, tofu) Preserved fish (such as pickled herring) Products that contain large amounts of yeast (such as bouillon cubes, powdered soup/gravy, homemade or sourdough bread)  Broad/fava beans  Following foods are OK to eat while taking linezolid: Pasteurized cheeses (such as [place], ricotta,  cottage cheese, cream cheese) Vegetables (not fermented or pickled) Non-cured or smoked meats

## 2023-07-14 NOTE — Plan of Care (Signed)

## 2023-07-14 NOTE — Progress Notes (Signed)
PT Cancellation Note  Patient Details Name: RAYDIN SIMARD MRN: [id number] DOB: 1956/09/16   Cancelled Treatment:     Therapist in this am, pt sitting on BSC. Brief discussion on treatment session plan for this date with knee scooter. Therapist returned, pt in bed and refusing any activity. Will re-attempt next available date/time   Jannet Askew 07/14/2023, 3:28 PM

## 2023-07-14 NOTE — Progress Notes (Signed)
Date of Admission:  07/10/2023      ID: Christian Fields is a 67 y.o. male Principal Problem:   Osteomyelitis of ankle or foot, acute, left (HCC) Active Problems:   Acute osteomyelitis of left calcaneus (HCC)   Diabetic infection of left foot (HCC)    Subjective: Pt doing well Medications:   aspirin EC  81 mg Oral Daily   buprenorphine-naloxone  1 tablet Sublingual TID   busPIRone  7.5 mg Oral BID   clopidogrel  75 mg Oral Daily   DULoxetine  30 mg Oral Daily   enoxaparin (LOVENOX) injection  40 mg Subcutaneous Q24H   gabapentin  600 mg Oral TID   insulin aspart  0-5 Units Subcutaneous QHS   insulin aspart  0-6 Units Subcutaneous TID WC   midazolam  1 mg Intravenous Once   pantoprazole  40 mg Oral Daily   simvastatin  40 mg Oral QPC supper    Objective: Vital signs in last 24 hours: Patient Vitals for the past 24 hrs:  BP Temp Temp src Pulse Resp SpO2  07/14/23 0730 128/73 98.5 F (36.9 C) -- 90 18 100 %  07/13/23 2320 134/79 97.9 F (36.6 C) -- 92 20 94 %  07/13/23 1625 131/86 97.6 F (36.4 C) Oral 98 18 95 %     PHYSICAL EXAM:  General: Alert, cooperative, no distress, appears stated age.  Head: Normocephalic, without obvious abnormality, atraumatic. Eyes: Conjunctivae clear, anicteric sclerae. Pupils are equal ENT Nares normal. No drainage or sinus tenderness. Lips, mucosa, and tongue normal. No Thrush Neck: Supple, symmetrical, no adenopathy, thyroid: non tender no carotid bruit and no JVD. Back: No CVA tenderness. Lungs: Clear to auscultation bilaterally. No Wheezing or Rhonchi. No rales. Heart: Regular rate and rhythm, no murmur, rub or gallop. Abdomen: Soft, non-tender,not distended. Bowel sounds normal. No masses Extremities: left foot surgical site looks clean         Skin: No rashes or lesions. Or bruising Lymph: Cervical, supraclavicular normal. Neurologic: Grossly non-focal  Lab Results    Latest Ref Rng & Units 07/14/2023    5:35 AM  07/13/2023    6:53 AM 07/12/2023    8:41 AM  CBC  WBC 4.0 - 10.5 K/uL 9.9  8.5  11.2   Hemoglobin 13.0 - 17.0 g/dL 44.0  9.9  34.7   Hematocrit 39.0 - 52.0 % 31.7  30.4  35.5   Platelets 150 - 400 K/uL 273  252  326        Latest Ref Rng & Units 07/14/2023    5:35 AM 07/13/2023    6:53 AM 07/12/2023    8:41 AM  CMP  Glucose 70 - 99 mg/dL 89   425   BUN 8 - 23 mg/dL 21   29   Creatinine 9.56 - 1.24 mg/dL 3.87  5.64  3.32   Sodium 135 - 145 mmol/L 137   136   Potassium 3.5 - 5.1 mmol/L 4.0   3.6   Chloride 98 - 111 mmol/L 105   101   CO2 22 - 32 mmol/L 22   24   Calcium 8.9 - 10.3 mg/dL 8.7   8.6       Microbiology: Surgical culture- NG   Assessment/Plan: Diabetes mellitus with diabetic foot infection Left foot ulcer Calcaneal osteomyelitis questioned Patient underwent debridement  Excision of the plantar calcaneal exostosis Full-thickness debridement to the tendon and muscle plantar left heel Bone biopsy taken Implantation of antibiotic impregnated beads left  heel Tissue Culture neg so far Superficial culture was skin bacteria like staph hemolyticus and corynebacterium ( no MRSA/No pseudomonas) Patient is  on vancomycin and cefepime and Flagyl Wants to go home  Bone pathology still pending  will DC on linezolid and ciprofloxacin Explained side effects and foods to avoid Material given Once biopsy and culture finalizes we can adjust antibiotic as needed? PAD status post stent on the left SFA   Diabetes mellitus with diabetic neuropathy  Discussed the management with patient and podiatrists and hospitalist Pt seen with Dr.Baker He will need labs next week- cbc/cmp Will follow him as Op next week  ____________________

## 2023-07-14 NOTE — Care Management Important Message (Signed)
Important Message  Patient Details  Name: Christian Fields MRN: [id number] Date of Birth: Oct 13, 1956   Medicare Important Message Given:  Yes     Johnell Comings 07/14/2023, 11:40 AM

## 2023-07-14 NOTE — Progress Notes (Signed)
PODIATRY / FOOT AND ANKLE SURGERY PROGRESS NOTE  Reason for consult: Left heel infection   HPI: Christian Fields is a 67 y.o. male who presents with for follow-up status post 3 days left foot incision and drainage with partial calcanectomy with rotational skin flap closure.  Patient has remained nonweightbearing since procedure.  Patient currently denies any pain.  PMHx:  Past Medical History:  Diagnosis Date   Anemia    Anxiety    Arthritis    Asthma    Cancer (HCC)    Basal Cell Skin Cancer   Chronic back pain    COPD (chronic obstructive pulmonary disease) (HCC)    Depression    Diabetes mellitus (HCC)    Dyspnea    GERD (gastroesophageal reflux disease)    Gout    Gout    Headache    History of blood clots    Left Leg--July 2018   History of kidney stones    Hyperlipidemia    Hyperlipidemia    Hypertension    Kidney stones    Neuropathy    On home oxygen therapy    2 L / M   Pneumonia 06/2017   Sleep apnea    Ulcer of foot (HCC)    Right    Surgical Hx:  Past Surgical History:  Procedure Laterality Date   APPENDECTOMY     DG FEET 2 VIEWS BILAT     IRRIGATION AND DEBRIDEMENT FOOT Left 07/11/2023   Procedure: IRRIGATION AND DEBRIDEMENT FOOT;  Surgeon: Gwyneth Revels, DPM;  Location: [facility] ORS;  Service: Orthopedics/Podiatry;  Laterality: Left;   LIPOMA EXCISION Right 08/15/2017   Procedure: EXCISION TUMOR(CYST) FOOT;  Surgeon: Recardo Evangelist, DPM;  Location: [facility] ORS;  Service: Podiatry;  Laterality: Right;   LOWER EXTREMITY ANGIOGRAPHY Left 07/03/2023   Procedure: Lower Extremity Angiography;  Surgeon: Annice Needy, MD;  Location: [facility] INV[facility];  Service: Cardiovascular;  Laterality: Left;   OTHER SURGICAL HISTORY Bilateral Foot surgery    FHx:  Family History  Problem Relation Age of Onset   Hypertension Mother     Social History:  reports that he has quit smoking. His smoking use included cigarettes. He started smoking about 6 years ago. He has a 3.3  pack-year smoking history. He has never used smokeless tobacco. He reports current alcohol use of about 1.0 standard drink of alcohol per week. He reports that he does not use drugs.  Allergies:  Allergies  Allergen Reactions   Bee Venom Anaphylaxis   Medications Prior to Admission  Medication Sig Dispense Refill   acetaminophen (TYLENOL) 325 MG tablet Take 650 mg by mouth every 6 (six) hours as needed.     albuterol (PROVENTIL) (2.5 MG/3ML) 0.083% nebulizer solution Take 2.5 mg by nebulization every 4 (four) hours as needed.     albuterol (VENTOLIN HFA) 108 (90 Base) MCG/ACT inhaler Inhale 2 puffs into the lungs every 4 (four) hours as needed for wheezing or shortness of breath.     allopurinol (ZYLOPRIM) 300 MG tablet Take 600 mg by mouth daily.     amoxicillin-clavulanate (AUGMENTIN) 875-125 MG tablet Take 1 tablet by mouth 2 (two) times daily for 10 days. 20 tablet 0   aspirin EC 81 MG tablet Take 1 tablet by mouth daily.     azelastine (ASTELIN) 0.1 % nasal spray Place 2 sprays into both nostrils 2 (two) times daily.     buprenorphine-naloxone (SUBOXONE) 8-2 mg SUBL SL tablet Place 1 tablet under the tongue  in the morning, at noon, and at bedtime.     busPIRone (BUSPAR) 7.5 MG tablet Take 7.5 mg by mouth 2 (two) times daily.     clopidogrel (PLAVIX) 75 MG tablet Take 1 tablet (75 mg total) by mouth daily. 30 tablet 1   cyanocobalamin (VITAMIN B12) 1000 MCG/ML injection Inject 1 mL (1,000 mcg total) into the muscle every 30 (thirty) days. 1 mL 11   doxycycline (VIBRA-TABS) 100 MG tablet Take 1 tablet (100 mg total) by mouth 2 (two) times daily for 10 days. 20 tablet 0   DULoxetine (CYMBALTA) 30 MG capsule Take 1 capsule by mouth daily.     ezetimibe (ZETIA) 10 MG tablet Take 10 mg by mouth daily.     FARXIGA 10 MG TABS tablet Take 10 mg by mouth daily.     fluticasone (FLONASE) 50 MCG/ACT nasal spray Place 1 spray into both nostrils daily as needed for allergies.      Fluticasone-Umeclidin-Vilant (TRELEGY ELLIPTA) 100-62.5-25 MCG/ACT AEPB Inhale into the lungs.     gabapentin (NEURONTIN) 800 MG tablet Take 800 mg by mouth 4 (four) times daily.     hydrochlorothiazide (HYDRODIURIL) 12.5 MG tablet Take 12.5 mg by mouth daily.     metFORMIN (GLUCOPHAGE) 1000 MG tablet Take 1,000 mg by mouth 2 (two) times daily with a meal.     metoprolol succinate (TOPROL-XL) 50 MG 24 hr tablet Take 50 mg by mouth daily.     ondansetron (ZOFRAN-ODT) 4 MG disintegrating tablet Take 4 mg by mouth every 8 (eight) hours as needed for nausea.     pantoprazole (PROTONIX) 40 MG tablet Take 40 mg by mouth daily.     roflumilast (DALIRESP) 500 MCG TABS tablet Take 500 mcg by mouth daily.     simvastatin (ZOCOR) 40 MG tablet Take 1 tablet by mouth daily.     cholecalciferol (VITAMIN D) 1000 units tablet Take 1,000 Units by mouth daily.     OXYGEN Inhale 2 L into the lungs.     Syringe/Needle, Disp, (SYRINGE 3CC/25GX1") 25G X 1" 3 ML MISC 1 Syringe by Does not apply route every 30 (thirty) days. 12 each 0    Physical Exam: General: Alert and oriented.  No apparent distress.  Vascular: DP/PT pulses diminished bilateral.  Capillary fill time intact to digits bilateral.  No hair growth noted to digits bilaterally.  Neuro: Light touch sensation absent to bilateral lower extremities.  Derm: Plantar heel area appears to have an incision that is intact today with no openings, minimal serous drainage, no odor, no erythema or edema, no obvious signs of infection clinically.  Mild maceration present.    MSK: Calcaneal gait left  Results for orders placed or performed during the hospital encounter of 07/10/23 (from the past 48 hour(s))  Glucose, capillary     Status: Abnormal   Collection Time: 07/12/23  4:34 PM  Result Value Ref Range   Glucose-Capillary 111 (H) 70 - 99 mg/dL    Comment: Glucose reference range applies only to samples taken after fasting for at least 8 hours.  Glucose,  capillary     Status: Abnormal   Collection Time: 07/12/23  9:57 PM  Result Value Ref Range   Glucose-Capillary 108 (H) 70 - 99 mg/dL    Comment: Glucose reference range applies only to samples taken after fasting for at least 8 hours.  CBC with Differential/Platelet     Status: Abnormal   Collection Time: 07/13/23  6:53 AM  Result Value Ref  Range   WBC 8.5 4.0 - 10.5 K/uL   RBC 3.13 (L) 4.22 - 5.81 MIL/uL   Hemoglobin 9.9 (L) 13.0 - 17.0 g/dL   HCT 62.1 (L) 30.8 - 65.7 %   MCV 97.1 80.0 - 100.0 fL   MCH 31.6 26.0 - 34.0 pg   MCHC 32.6 30.0 - 36.0 g/dL   RDW 84.6 96.2 - 95.2 %   Platelets 252 150 - 400 K/uL   nRBC 0.0 0.0 - 0.2 %   Neutrophils Relative % 67 %   Neutro Abs 5.7 1.7 - 7.7 K/uL   Lymphocytes Relative 19 %   Lymphs Abs 1.6 0.7 - 4.0 K/uL   Monocytes Relative 5 %   Monocytes Absolute 0.5 0.1 - 1.0 K/uL   Eosinophils Relative 7 %   Eosinophils Absolute 0.6 (H) 0.0 - 0.5 K/uL   Basophils Relative 1 %   Basophils Absolute 0.1 0.0 - 0.1 K/uL   Immature Granulocytes 1 %   Abs Immature Granulocytes 0.05 0.00 - 0.07 K/uL    Comment: Performed at [facility], [address] [place], [place] [address]  Creatinine, serum     Status: None   Collection Time: 07/13/23  6:53 AM  Result Value Ref Range   Creatinine, Ser 0.89 0.61 - 1.24 mg/dL   GFR, Estimated >44 >01 mL/min    Comment: (NOTE) Calculated using the CKD-EPI Creatinine Equation (2021) Performed at [facility], [address] [place], [place] [address]   Glucose, capillary     Status: None   Collection Time: 07/13/23  7:52 AM  Result Value Ref Range   Glucose-Capillary 99 70 - 99 mg/dL    Comment: Glucose reference range applies only to samples taken after fasting for at least 8 hours.  Glucose, capillary     Status: Abnormal   Collection Time: 07/13/23 11:49 AM  Result Value Ref Range   Glucose-Capillary 137 (H) 70 - 99 mg/dL    Comment: Glucose reference range applies only to samples  taken after fasting for at least 8 hours.  Glucose, capillary     Status: Abnormal   Collection Time: 07/13/23  4:58 PM  Result Value Ref Range   Glucose-Capillary 134 (H) 70 - 99 mg/dL    Comment: Glucose reference range applies only to samples taken after fasting for at least 8 hours.  Glucose, capillary     Status: Abnormal   Collection Time: 07/13/23  9:10 PM  Result Value Ref Range   Glucose-Capillary 115 (H) 70 - 99 mg/dL    Comment: Glucose reference range applies only to samples taken after fasting for at least 8 hours.  CBC with Differential/Platelet     Status: Abnormal   Collection Time: 07/14/23  5:35 AM  Result Value Ref Range   WBC 9.9 4.0 - 10.5 K/uL   RBC 3.21 (L) 4.22 - 5.81 MIL/uL   Hemoglobin 10.0 (L) 13.0 - 17.0 g/dL   HCT 36.6 (L) 44.0 - 34.7 %   MCV 98.8 80.0 - 100.0 fL   MCH 31.2 26.0 - 34.0 pg   MCHC 31.5 30.0 - 36.0 g/dL   RDW 42.5 95.6 - 38.7 %   Platelets 273 150 - 400 K/uL   nRBC 0.0 0.0 - 0.2 %   Neutrophils Relative % 67 %   Neutro Abs 6.7 1.7 - 7.7 K/uL   Lymphocytes Relative 17 %   Lymphs Abs 1.7 0.7 - 4.0 K/uL   Monocytes Relative 6 %  Monocytes Absolute 0.5 0.1 - 1.0 K/uL   Eosinophils Relative 8 %   Eosinophils Absolute 0.8 (H) 0.0 - 0.5 K/uL   Basophils Relative 1 %   Basophils Absolute 0.1 0.0 - 0.1 K/uL   Immature Granulocytes 1 %   Abs Immature Granulocytes 0.06 0.00 - 0.07 K/uL    Comment: Performed at [facility], [address] [place], [place] [address]  Basic metabolic panel     Status: Abnormal   Collection Time: 07/14/23  5:35 AM  Result Value Ref Range   Sodium 137 135 - 145 mmol/L   Potassium 4.0 3.5 - 5.1 mmol/L   Chloride 105 98 - 111 mmol/L   CO2 22 22 - 32 mmol/L   Glucose, Bld 89 70 - 99 mg/dL    Comment: Glucose reference range applies only to samples taken after fasting for at least 8 hours.   BUN 21 8 - 23 mg/dL   Creatinine, Ser 6.96 0.61 - 1.24 mg/dL   Calcium 8.7 (L) 8.9 - 10.3 mg/dL   GFR,  Estimated >29 >52 mL/min    Comment: (NOTE) Calculated using the CKD-EPI Creatinine Equation (2021)    Anion gap 10 5 - 15    Comment: Performed at [facility], [address] [place], [place] [address]  Glucose, capillary     Status: None   Collection Time: 07/14/23  7:32 AM  Result Value Ref Range   Glucose-Capillary 85 70 - 99 mg/dL    Comment: Glucose reference range applies only to samples taken after fasting for at least 8 hours.  Glucose, capillary     Status: None   Collection Time: 07/14/23 11:21 AM  Result Value Ref Range   Glucose-Capillary 88 70 - 99 mg/dL    Comment: Glucose reference range applies only to samples taken after fasting for at least 8 hours.   No results found.  Blood pressure 128/73, pulse 90, temperature 98.5 F (36.9 C), resp. rate 18, height 5\' 5"  (1.651 m), weight 61.2 kg, SpO2 100%.  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  Plan -Patient seen and examined. -Incision site appears to be well coapted overall with sutures intact, no dehiscence present, minimal drainage today.  Mild maceration.  No odor, reduction in erythema and edema present today overall.  Appears to be stable at this time. -Redressed today with Betadine to incision line, Xeroform to the incision line followed by 4 x 4 gauze, ABD pads, Kerlix, Ace wrap.  Recommend dressing changes every 2 to 3 days. -Patient remain nonweightbearing at all times left lower extremity, stressed importance. -Wound cultures from surgery with no growth to date, bone culture no growth.  Wound culture preop growing gram-positive cocci, staph and corynebacterium.  Appreciate infectious disease recommendations for antibiotic therapy.  Path pending.  Does not appear to have bone infection based on cultures alone. -Appreciate PT  recommendations.  Podiatry team to sign off at this time.  Patient to follow-up with Dr. Ether Griffins within 2 weeks of discharge date.  Rosetta Posner, DPM 07/14/2023, 12:48 PM

## 2023-07-16 ENCOUNTER — Encounter: Payer: Self-pay | Admitting: Oncology

## 2023-07-21 ENCOUNTER — Other Ambulatory Visit (INDEPENDENT_AMBULATORY_CARE_PROVIDER_SITE_OTHER): Payer: Self-pay | Admitting: Vascular Surgery

## 2023-07-21 ENCOUNTER — Telehealth: Payer: Self-pay | Admitting: Infectious Diseases

## 2023-07-21 DIAGNOSIS — Z9889 Other specified postprocedural states: Secondary | ICD-10-CM

## 2023-07-21 DIAGNOSIS — I714 Abdominal aortic aneurysm, without rupture, unspecified: Secondary | ICD-10-CM

## 2023-07-21 NOTE — Telephone Encounter (Signed)
Pt's daughter had called and left a message over the weekend that he was feeling nauseous- Pt is on cipro and linezolid for 2 weeks for foot infection . I spoke to her this morning and she says he is doing better today- He is taking zofran 2-3/days- some nasuea. But he has more energy0- Also his BP was on the lower side and they are holding his hydrochlorothiazide and he is better now- Told her to contact PCP, but he has to get a new PCP as Dr.Ziglers office is apparently closing. Pt has an appt with me on Thursday, and also is seeing Dr.Fowler . Gave the result of the bone biopsy to the daughter- chronic in active osteo. The surgical wound is healing well and he is off his feet- uses knee scooter

## 2023-07-23 ENCOUNTER — Ambulatory Visit (INDEPENDENT_AMBULATORY_CARE_PROVIDER_SITE_OTHER): Payer: 59 | Admitting: Nurse Practitioner

## 2023-07-23 ENCOUNTER — Encounter (INDEPENDENT_AMBULATORY_CARE_PROVIDER_SITE_OTHER): Payer: Medicare HMO

## 2023-07-23 ENCOUNTER — Other Ambulatory Visit (INDEPENDENT_AMBULATORY_CARE_PROVIDER_SITE_OTHER): Payer: Medicare HMO

## 2023-07-24 ENCOUNTER — Inpatient Hospital Stay: Payer: 59 | Admitting: Infectious Diseases

## 2023-07-27 DIAGNOSIS — R638 Other symptoms and signs concerning food and fluid intake: Secondary | ICD-10-CM | POA: Insufficient documentation

## 2023-07-27 DIAGNOSIS — R7989 Other specified abnormal findings of blood chemistry: Secondary | ICD-10-CM | POA: Insufficient documentation

## 2023-07-28 ENCOUNTER — Telehealth: Payer: Self-pay

## 2023-07-28 NOTE — Telephone Encounter (Signed)
Retrieved voicemail this morning that was left Saturday 8/17 at 2:42 PM while office was closed from Newell Coral, PT with [facility] home health. She stated she was working with the patient and he complained of lightheadedness with a BP of 80/50, HR 83 and O2 sat 91%. Also mentions he's been complaining of decreased urine output and would like recommendation on whether or not patient should go to the ED.   Called patient to assess symptoms, he reports he is currently admitted at [facility].   Sandie Ano, RN

## 2023-07-30 ENCOUNTER — Ambulatory Visit: Payer: 59

## 2023-07-31 ENCOUNTER — Ambulatory Visit: Payer: 59 | Admitting: Radiation Oncology

## 2023-07-31 ENCOUNTER — Inpatient Hospital Stay: Payer: 59 | Admitting: Infectious Diseases

## 2023-08-01 ENCOUNTER — Inpatient Hospital Stay: Payer: 59 | Admitting: Oncology

## 2023-08-01 DIAGNOSIS — E60 Dietary zinc deficiency: Secondary | ICD-10-CM | POA: Insufficient documentation

## 2023-08-01 DIAGNOSIS — R21 Rash and other nonspecific skin eruption: Secondary | ICD-10-CM | POA: Insufficient documentation

## 2023-08-01 DIAGNOSIS — D696 Thrombocytopenia, unspecified: Secondary | ICD-10-CM | POA: Insufficient documentation

## 2023-08-12 ENCOUNTER — Ambulatory Visit: Payer: 59 | Attending: Infectious Diseases | Admitting: Infectious Diseases

## 2023-08-12 DIAGNOSIS — L97426 Non-pressure chronic ulcer of left heel and midfoot with bone involvement without evidence of necrosis: Secondary | ICD-10-CM | POA: Diagnosis present

## 2023-08-12 DIAGNOSIS — L97526 Non-pressure chronic ulcer of other part of left foot with bone involvement without evidence of necrosis: Secondary | ICD-10-CM | POA: Diagnosis not present

## 2023-08-12 DIAGNOSIS — Z79899 Other long term (current) drug therapy: Secondary | ICD-10-CM | POA: Insufficient documentation

## 2023-08-12 DIAGNOSIS — E11621 Type 2 diabetes mellitus with foot ulcer: Secondary | ICD-10-CM | POA: Diagnosis not present

## 2023-08-12 DIAGNOSIS — D649 Anemia, unspecified: Secondary | ICD-10-CM | POA: Insufficient documentation

## 2023-08-12 NOTE — Progress Notes (Signed)
NAME: Christian Fields  DOB: [birth year]  MRN: [id number]  Date/Time: 08/12/2023 10:38 AM  The purpose of this virtual visit is to provide medical care while limiting exposure to the novel coronavirus (COVID19) for both patient and office staff.   Consent was obtained for video visit:  Yes.   Answered questions that patient had about telehealth interaction:  Yes.   I discussed the limitations, risks, security and privacy concerns of performing an evaluation and management service by telephone. I also discussed with the patient that there may be a patient responsible charge related to this service. The patient expressed understanding and agreed to proceed.   Patient Location: Home Provider Location:office Daughter, patient and provider on the call ? MORROW FISS is a 67 y.o. male with a history of COPD, HTN, DM, chronic foot ulcer left for which he had surgery and culture was positive for staph hemolyticus- he was discharged home on 07/14/23 on  cipro and lineolid for 2 weeks and follow up with me. I talked to his daughter on 8/12 because of low energy and hypotension and she was holding the hydrochlorothiazide and was to follow up with PCP and see me as scheduled and was asked to do labs as he was on linezolid He was admitted to [facility] 07/26/23-08/01/23 with dizziness weakness, poor appetite and had hypotension, increased cr and elevated lactate  thrombocytopenia was discovered and it was thought to be due to linezolid- plt count recovered from 63 to 438  Pt is now doing very well No antibiotics currently Eating well No dizziness Followed with podiatrist  Past Medical History:  Diagnosis Date   Anemia    Anxiety    Arthritis    Asthma    Cancer (HCC)    Basal Cell Skin Cancer   Chronic back pain    COPD (chronic obstructive pulmonary disease) (HCC)    Depression    Diabetes mellitus (HCC)    Dyspnea    GERD (gastroesophageal reflux disease)    Gout    Gout    Headache    History of  blood clots    Left Leg--July 2018   History of kidney stones    Hyperlipidemia    Hyperlipidemia    Hypertension    Kidney stones    Neuropathy    On home oxygen therapy    2 L / M   Pneumonia 06/2017   Sleep apnea    Ulcer of foot (HCC)    Right    Past Surgical History:  Procedure Laterality Date   APPENDECTOMY     DG FEET 2 VIEWS BILAT     IRRIGATION AND DEBRIDEMENT FOOT Left 07/11/2023   Procedure: IRRIGATION AND DEBRIDEMENT FOOT;  Surgeon: Gwyneth Revels, DPM;  Location: [facility] ORS;  Service: Orthopedics/Podiatry;  Laterality: Left;   LIPOMA EXCISION Right 08/15/2017   Procedure: EXCISION TUMOR(CYST) FOOT;  Surgeon: Recardo Evangelist, DPM;  Location: [facility] ORS;  Service: Podiatry;  Laterality: Right;   LOWER EXTREMITY ANGIOGRAPHY Left 07/03/2023   Procedure: Lower Extremity Angiography;  Surgeon: Annice Needy, MD;  Location: [facility] INVASIVE CV LAB;  Service: Cardiovascular;  Laterality: Left;   OTHER SURGICAL HISTORY Bilateral Foot surgery    Social History   Socioeconomic History   Marital status: Widowed    Spouse name: Not on file   Number of children: Not on file   Years of education: Not on file   Highest education level: Not on file  Occupational History   Not on  file  Tobacco Use   Smoking status: Former    Current packs/day: 0.50    Average packs/day: 0.5 packs/day for 6.7 years (3.3 ttl pk-yrs)    Types: Cigarettes    Start date: 2018   Smokeless tobacco: Never  Vaping Use   Vaping status: Never Used  Substance and Sexual Activity   Alcohol use: Yes    Alcohol/week: 1.0 standard drink of alcohol    Types: 1 Cans of beer per week    Comment: occ   Drug use: No   Sexual activity: Not Currently  Other Topics Concern   Not on file  Social History Narrative   Not on file   Social Determinants of Health   Financial Resource Strain: Low Risk  (04/19/2023)   Received from [facility]   Overall Financial Resource Strain (CARDIA)     Difficulty of Paying Living Expenses: Not hard at all  Food Insecurity: No Food Insecurity (07/10/2023)   Hunger Vital Sign    Worried About Running Out of Food in the Last Year: Never true    Ran Out of Food in the Last Year: Never true  Transportation Needs: No Transportation Needs (07/10/2023)   PRAPARE - Administrator, Civil Service (Medical): No    Lack of Transportation (Non-Medical): No  Physical Activity: Not on file  Stress: Not on file  Social Connections: Not on file  Intimate Partner Violence: Not At Risk (07/10/2023)   Humiliation, Afraid, Rape, and Kick questionnaire    Fear of Current or Ex-Partner: No    Emotionally Abused: No    Physically Abused: No    Sexually Abused: No    Family History  Problem Relation Age of Onset   Hypertension Mother    Allergies  Allergen Reactions   Bee Venom Anaphylaxis   I? Current Outpatient Medications  Medication Sig Dispense Refill   acetaminophen (TYLENOL) 325 MG tablet Take 650 mg by mouth every 6 (six) hours as needed.     albuterol (PROVENTIL) (2.5 MG/3ML) 0.083% nebulizer solution Take 2.5 mg by nebulization every 4 (four) hours as needed.     albuterol (VENTOLIN HFA) 108 (90 Base) MCG/ACT inhaler Inhale 2 puffs into the lungs every 4 (four) hours as needed for wheezing or shortness of breath.     allopurinol (ZYLOPRIM) 300 MG tablet Take 600 mg by mouth daily.     aspirin EC 81 MG tablet Take 1 tablet by mouth daily.     azelastine (ASTELIN) 0.1 % nasal spray Place 2 sprays into both nostrils 2 (two) times daily.     buprenorphine-naloxone (SUBOXONE) 8-2 mg SUBL SL tablet Place 1 tablet under the tongue in the morning, at noon, and at bedtime.     cholecalciferol (VITAMIN D) 1000 units tablet Take 1,000 Units by mouth daily.     clopidogrel (PLAVIX) 75 MG tablet Take 1 tablet (75 mg total) by mouth daily. 30 tablet 1   cyanocobalamin (VITAMIN B12) 1000 MCG/ML injection Inject 1 mL (1,000 mcg total) into the muscle  every 30 (thirty) days. 1 mL 11   DULoxetine (CYMBALTA) 30 MG capsule Take 1 capsule by mouth daily.     ezetimibe (ZETIA) 10 MG tablet Take 10 mg by mouth daily.     FARXIGA 10 MG TABS tablet Take 10 mg by mouth daily.     fluticasone (FLONASE) 50 MCG/ACT nasal spray Place 1 spray into both nostrils daily as needed for allergies.  Fluticasone-Umeclidin-Vilant (TRELEGY ELLIPTA) 100-62.5-25 MCG/ACT AEPB Inhale into the lungs.     gabapentin (NEURONTIN) 800 MG tablet Take 800 mg by mouth 4 (four) times daily.     linezolid (ZYVOX) 600 MG tablet Take 1 tablet (600 mg total) by mouth 2 (two) times daily. 28 tablet 0   metFORMIN (GLUCOPHAGE) 1000 MG tablet Take 1,000 mg by mouth 2 (two) times daily with a meal.     metoprolol succinate (TOPROL-XL) 50 MG 24 hr tablet Take 50 mg by mouth daily.     ondansetron (ZOFRAN-ODT) 4 MG disintegrating tablet Take 4 mg by mouth every 8 (eight) hours as needed for nausea.     OXYGEN Inhale 2 L into the lungs.     pantoprazole (PROTONIX) 40 MG tablet Take 40 mg by mouth daily.     roflumilast (DALIRESP) 500 MCG TABS tablet Take 500 mcg by mouth daily.     simvastatin (ZOCOR) 40 MG tablet Take 1 tablet by mouth daily.     Syringe/Needle, Disp, (SYRINGE 3CC/25GX1") 25G X 1" 3 ML MISC 1 Syringe by Does not apply route every 30 (thirty) days. 12 each 0   No current facility-administered medications for this visit.     Abtx:  Anti-infectives (From admission, onward)    None       REVIEW OF SYSTEMS:  Const: negative fever, negative chills, negative weight loss Eyes: negative diplopia or visual changes, negative eye pain ENT: negative coryza, negative sore throat Resp: negative cough, hemoptysis, dyspnea Cards: negative for chest pain, palpitations, lower extremity edema GU: negative for frequency, dysuria and hematuria GI: Negative for abdominal pain, diarrhea, bleeding, constipation Skin: negative for rash and pruritus Heme: negative for easy  bruising and gum/nose bleeding MS: negative for myalgias, arthralgias, back pain and muscle weakness Neurolo:negative for headaches, dizziness, vertigo, memory problems  Psych: negative for feelings of anxiety, depression  Endocrine: negative for thyroid, diabetes Allergy/Immunology- negative for any medication or food allergies ? Pertinent Positives include : PHYSICAL EXAM:  General: Alert, looks well Nasal cannila oxygen Extremities: acound not see his foot But he showed a picture after [facility] had opened the wound   ? Impression/Recommendation ?Left foot chronic ulcer heel with chronic inactive oste in July 2024- had debridement and partial calcanectomy Discharged on linezolid and cipro on 8.5 for 2 weeks  Hospitalized at [facility] on 8/17 and sepsis ruled pout No active osteo The hyperlactatemia ( 2.6) , and thrombocytopenia thought to be due to linezolid Thrombocytopenia recovered completely and last platelet  was 438  Anemia  Wound healing well as per patient He will follow up with Dr.Fowler-podiatrist Discharged from my clinic  Discussed with daughter and patient total time spent 8 min   Note:  This document was prepared using Dragon voice recognition software and may include unintentional dictation errors.

## 2023-08-18 ENCOUNTER — Ambulatory Visit: Payer: 59 | Admitting: Radiation Oncology

## 2023-08-26 ENCOUNTER — Ambulatory Visit
Admission: RE | Admit: 2023-08-26 | Discharge: 2023-08-26 | Disposition: A | Discharge status: Home / Self Care | Payer: 59 | Source: Ambulatory Visit | Attending: Oncology | Admitting: Oncology

## 2023-08-26 DIAGNOSIS — C3412 Malignant neoplasm of upper lobe, left bronchus or lung: Secondary | ICD-10-CM | POA: Insufficient documentation

## 2023-08-30 ENCOUNTER — Emergency Department: Payer: 59

## 2023-08-30 ENCOUNTER — Inpatient Hospital Stay
Admission: EM | Admit: 2023-08-30 | Discharge: 2023-09-04 | DRG: 871 | Disposition: A | Discharge status: Home Health | Payer: 59 | Attending: Internal Medicine | Admitting: Internal Medicine

## 2023-08-30 ENCOUNTER — Other Ambulatory Visit: Payer: Self-pay

## 2023-08-30 DIAGNOSIS — G894 Chronic pain syndrome: Secondary | ICD-10-CM | POA: Diagnosis present

## 2023-08-30 DIAGNOSIS — F17210 Nicotine dependence, cigarettes, uncomplicated: Secondary | ICD-10-CM | POA: Diagnosis present

## 2023-08-30 DIAGNOSIS — D649 Anemia, unspecified: Secondary | ICD-10-CM | POA: Diagnosis present

## 2023-08-30 DIAGNOSIS — E876 Hypokalemia: Secondary | ICD-10-CM | POA: Diagnosis not present

## 2023-08-30 DIAGNOSIS — Z85828 Personal history of other malignant neoplasm of skin: Secondary | ICD-10-CM

## 2023-08-30 DIAGNOSIS — I110 Hypertensive heart disease with heart failure: Secondary | ICD-10-CM | POA: Diagnosis present

## 2023-08-30 DIAGNOSIS — F32A Depression, unspecified: Secondary | ICD-10-CM | POA: Diagnosis present

## 2023-08-30 DIAGNOSIS — J189 Pneumonia, unspecified organism: Secondary | ICD-10-CM | POA: Diagnosis present

## 2023-08-30 DIAGNOSIS — E785 Hyperlipidemia, unspecified: Secondary | ICD-10-CM | POA: Diagnosis present

## 2023-08-30 DIAGNOSIS — Z1152 Encounter for screening for COVID-19: Secondary | ICD-10-CM | POA: Diagnosis not present

## 2023-08-30 DIAGNOSIS — C3492 Malignant neoplasm of unspecified part of left bronchus or lung: Secondary | ICD-10-CM | POA: Diagnosis present

## 2023-08-30 DIAGNOSIS — I2510 Atherosclerotic heart disease of native coronary artery without angina pectoris: Secondary | ICD-10-CM | POA: Diagnosis present

## 2023-08-30 DIAGNOSIS — Z794 Long term (current) use of insulin: Secondary | ICD-10-CM | POA: Diagnosis not present

## 2023-08-30 DIAGNOSIS — Z86718 Personal history of other venous thrombosis and embolism: Secondary | ICD-10-CM

## 2023-08-30 DIAGNOSIS — Z79899 Other long term (current) drug therapy: Secondary | ICD-10-CM

## 2023-08-30 DIAGNOSIS — Z7984 Long term (current) use of oral hypoglycemic drugs: Secondary | ICD-10-CM | POA: Diagnosis not present

## 2023-08-30 DIAGNOSIS — E1140 Type 2 diabetes mellitus with diabetic neuropathy, unspecified: Secondary | ICD-10-CM | POA: Diagnosis present

## 2023-08-30 DIAGNOSIS — R0902 Hypoxemia: Secondary | ICD-10-CM

## 2023-08-30 DIAGNOSIS — I5032 Chronic diastolic (congestive) heart failure: Secondary | ICD-10-CM | POA: Diagnosis not present

## 2023-08-30 DIAGNOSIS — J440 Chronic obstructive pulmonary disease with acute lower respiratory infection: Secondary | ICD-10-CM | POA: Diagnosis present

## 2023-08-30 DIAGNOSIS — E119 Type 2 diabetes mellitus without complications: Secondary | ICD-10-CM

## 2023-08-30 DIAGNOSIS — Z7982 Long term (current) use of aspirin: Secondary | ICD-10-CM

## 2023-08-30 DIAGNOSIS — M549 Dorsalgia, unspecified: Secondary | ICD-10-CM | POA: Diagnosis present

## 2023-08-30 DIAGNOSIS — I5033 Acute on chronic diastolic (congestive) heart failure: Secondary | ICD-10-CM | POA: Diagnosis present

## 2023-08-30 DIAGNOSIS — Z8249 Family history of ischemic heart disease and other diseases of the circulatory system: Secondary | ICD-10-CM

## 2023-08-30 DIAGNOSIS — E869 Volume depletion, unspecified: Secondary | ICD-10-CM | POA: Diagnosis present

## 2023-08-30 DIAGNOSIS — J9621 Acute and chronic respiratory failure with hypoxia: Secondary | ICD-10-CM | POA: Diagnosis present

## 2023-08-30 DIAGNOSIS — Z9981 Dependence on supplemental oxygen: Secondary | ICD-10-CM

## 2023-08-30 DIAGNOSIS — Z881 Allergy status to other antibiotic agents status: Secondary | ICD-10-CM

## 2023-08-30 DIAGNOSIS — M109 Gout, unspecified: Secondary | ICD-10-CM | POA: Diagnosis present

## 2023-08-30 DIAGNOSIS — J441 Chronic obstructive pulmonary disease with (acute) exacerbation: Secondary | ICD-10-CM | POA: Diagnosis present

## 2023-08-30 DIAGNOSIS — A419 Sepsis, unspecified organism: Principal | ICD-10-CM

## 2023-08-30 DIAGNOSIS — I739 Peripheral vascular disease, unspecified: Secondary | ICD-10-CM | POA: Diagnosis present

## 2023-08-30 DIAGNOSIS — Z888 Allergy status to other drugs, medicaments and biological substances status: Secondary | ICD-10-CM

## 2023-08-30 DIAGNOSIS — Z87442 Personal history of urinary calculi: Secondary | ICD-10-CM

## 2023-08-30 DIAGNOSIS — E1151 Type 2 diabetes mellitus with diabetic peripheral angiopathy without gangrene: Secondary | ICD-10-CM | POA: Diagnosis present

## 2023-08-30 DIAGNOSIS — Z7902 Long term (current) use of antithrombotics/antiplatelets: Secondary | ICD-10-CM

## 2023-08-30 DIAGNOSIS — R6520 Severe sepsis without septic shock: Secondary | ICD-10-CM | POA: Diagnosis present

## 2023-08-30 DIAGNOSIS — G629 Polyneuropathy, unspecified: Secondary | ICD-10-CM | POA: Diagnosis present

## 2023-08-30 DIAGNOSIS — K219 Gastro-esophageal reflux disease without esophagitis: Secondary | ICD-10-CM | POA: Diagnosis present

## 2023-08-30 DIAGNOSIS — F419 Anxiety disorder, unspecified: Secondary | ICD-10-CM | POA: Diagnosis present

## 2023-08-30 DIAGNOSIS — Z91030 Bee allergy status: Secondary | ICD-10-CM

## 2023-08-30 DIAGNOSIS — Z8739 Personal history of other diseases of the musculoskeletal system and connective tissue: Secondary | ICD-10-CM

## 2023-08-30 DIAGNOSIS — T363X5A Adverse effect of macrolides, initial encounter: Secondary | ICD-10-CM | POA: Diagnosis not present

## 2023-08-30 LAB — COMPREHENSIVE METABOLIC PANEL
ALT: 13 U/L (ref 0–44)
AST: 17 U/L (ref 15–41)
Albumin: 3.3 g/dL — ABNORMAL LOW (ref 3.5–5.0)
Alkaline Phosphatase: 117 U/L (ref 38–126)
Anion gap: 9 (ref 5–15)
BUN: 19 mg/dL (ref 8–23)
CO2: 24 mmol/L (ref 22–32)
Calcium: 9 mg/dL (ref 8.9–10.3)
Chloride: 102 mmol/L (ref 98–111)
Creatinine, Ser: 1.24 mg/dL (ref 0.61–1.24)
GFR, Estimated: >60 mL/min (ref >60)
Glucose, Bld: 131 mg/dL — ABNORMAL HIGH (ref 70–99)
Potassium: 4.2 mmol/L (ref 3.5–5.1)
Sodium: 135 mmol/L (ref 135–145)
Total Bilirubin: 0.5 mg/dL (ref 0.3–1.2)
Total Protein: 8 g/dL (ref 6.5–8.1)

## 2023-08-30 LAB — CBC WITH DIFFERENTIAL/PLATELET
Abs Immature Granulocytes: 0.6 10*3/uL — ABNORMAL HIGH (ref 0.00–0.07)
Basophils Absolute: 0.2 10*3/uL — ABNORMAL HIGH (ref 0.0–0.1)
Basophils Relative: 0 %
Eosinophils Absolute: 0.1 10*3/uL (ref 0.0–0.5)
Eosinophils Relative: 0 %
HCT: 33.3 % — ABNORMAL LOW (ref 39.0–52.0)
Hemoglobin: 9.9 g/dL — ABNORMAL LOW (ref 13.0–17.0)
Immature Granulocytes: 2 %
Lymphocytes Relative: 4 %
Lymphs Abs: 1.6 10*3/uL (ref 0.7–4.0)
MCH: 29.5 pg (ref 26.0–34.0)
MCHC: 29.7 g/dL — ABNORMAL LOW (ref 30.0–36.0)
MCV: 99.1 fL (ref 80.0–100.0)
Monocytes Absolute: 1.6 10*3/uL — ABNORMAL HIGH (ref 0.1–1.0)
Monocytes Relative: 4 %
Neutro Abs: 37.1 10*3/uL — ABNORMAL HIGH (ref 1.7–7.7)
Neutrophils Relative %: 90 %
Platelets: 326 10*3/uL (ref 150–400)
RBC: 3.36 MIL/uL — ABNORMAL LOW (ref 4.22–5.81)
RDW: 15.6 % — ABNORMAL HIGH (ref 11.5–15.5)
Smear Review: NORMAL
WBC Morphology: INCREASED
WBC: 41.2 10*3/uL — ABNORMAL HIGH (ref 4.0–10.5)
nRBC: 0 % (ref 0.0–0.2)

## 2023-08-30 LAB — APTT: aPTT: 29 seconds (ref 24–36)

## 2023-08-30 LAB — BLOOD GAS, VENOUS
Acid-base deficit: 1.4 mmol/L (ref 0.0–2.0)
Bicarbonate: 26.3 mmol/L (ref 20.0–28.0)
O2 Saturation: 96 %
Patient temperature: 37
pCO2, Ven: 56 mmHg (ref 44–60)
pH, Ven: 7.28 (ref 7.25–7.43)
pO2, Ven: 75 mmHg — ABNORMAL HIGH (ref 32–45)

## 2023-08-30 LAB — LACTIC ACID, PLASMA: Lactic Acid, Venous: 1.6 mmol/L (ref 0.5–1.9)

## 2023-08-30 LAB — MRSA NEXT GEN BY PCR, NASAL: MRSA by PCR Next Gen: NOT DETECTED

## 2023-08-30 LAB — TROPONIN I (HIGH SENSITIVITY): Troponin I (High Sensitivity): 9 ng/L (ref <18)

## 2023-08-30 LAB — GLUCOSE, CAPILLARY
Glucose-Capillary: 148 mg/dL — ABNORMAL HIGH (ref 70–99)
Glucose-Capillary: 206 mg/dL — ABNORMAL HIGH (ref 70–99)

## 2023-08-30 LAB — BRAIN NATRIURETIC PEPTIDE: B Natriuretic Peptide: 198.6 pg/mL — ABNORMAL HIGH (ref 0.0–100.0)

## 2023-08-30 LAB — SARS CORONAVIRUS 2 BY RT PCR: SARS Coronavirus 2 by RT PCR: NEGATIVE

## 2023-08-30 LAB — PROTIME-INR
INR: 1 (ref 0.8–1.2)
Prothrombin Time: 13.3 seconds (ref 11.4–15.2)

## 2023-08-30 MED ORDER — INSULIN ASPART 100 UNIT/ML IJ SOLN
0.0000 [IU] | Freq: Every day | INTRAMUSCULAR | Status: DC
  Administered 2023-08-30: 2 [IU] via SUBCUTANEOUS
  Filled 2023-08-30: qty 1

## 2023-08-30 MED ORDER — IPRATROPIUM-ALBUTEROL 0.5-2.5 (3) MG/3ML IN SOLN: 9.0000 mL | Freq: Once | RESPIRATORY_TRACT | Status: AC

## 2023-08-30 MED ORDER — UMECLIDINIUM BROMIDE 62.5 MCG/ACT IN AEPB
1.0000 | INHALATION_SPRAY | Freq: Every day | RESPIRATORY_TRACT | Status: DC
  Administered 2023-08-30 – 2023-09-04 (×6): 1 via RESPIRATORY_TRACT
  Filled 2023-08-30: qty 7

## 2023-08-30 MED ORDER — BISACODYL 5 MG PO TBEC: 5.0000 mg | DELAYED_RELEASE_TABLET | Freq: Every day | ORAL | Status: DC | PRN

## 2023-08-30 MED ORDER — METHYLPREDNISOLONE SODIUM SUCC 125 MG IJ SOLR
125.0000 mg | Freq: Once | INTRAMUSCULAR | Status: AC
  Administered 2023-08-30: 125 mg via INTRAVENOUS
  Filled 2023-08-30: qty 2

## 2023-08-30 MED ORDER — LACTATED RINGERS IV SOLN: INTRAVENOUS | Status: AC

## 2023-08-30 MED ORDER — ENOXAPARIN SODIUM 40 MG/0.4ML IJ SOSY
40.0000 mg | PREFILLED_SYRINGE | INTRAMUSCULAR | Status: DC
  Administered 2023-08-30 – 2023-09-03 (×5): 40 mg via SUBCUTANEOUS
  Filled 2023-08-30 (×5): qty 0.4

## 2023-08-30 MED ORDER — SODIUM CHLORIDE 0.9 % IV SOLN
500.0000 mg | Freq: Once | INTRAVENOUS | Status: AC
  Administered 2023-08-30: 500 mg via INTRAVENOUS
  Filled 2023-08-30: qty 5

## 2023-08-30 MED ORDER — CHLORHEXIDINE GLUCONATE CLOTH 2 % EX PADS: 6.0000 | MEDICATED_PAD | Freq: Every day | CUTANEOUS | Status: DC

## 2023-08-30 MED ORDER — SODIUM CHLORIDE 0.9 % IV SOLN
100.0000 mg | Freq: Once | INTRAVENOUS | Status: AC
  Administered 2023-08-30: 100 mg via INTRAVENOUS
  Filled 2023-08-30: qty 100

## 2023-08-30 MED ORDER — SODIUM CHLORIDE 0.9 % IV SOLN
100.0000 mg | Freq: Two times a day (BID) | INTRAVENOUS | Status: DC
  Administered 2023-08-30: 100 mg via INTRAVENOUS
  Filled 2023-08-30 (×3): qty 100

## 2023-08-30 MED ORDER — ALBUTEROL SULFATE (2.5 MG/3ML) 0.083% IN NEBU
2.5000 mg | INHALATION_SOLUTION | RESPIRATORY_TRACT | Status: DC | PRN
  Administered 2023-08-31 – 2023-09-01 (×2): 2.5 mg via RESPIRATORY_TRACT
  Filled 2023-08-30 (×2): qty 3

## 2023-08-30 MED ORDER — SODIUM CHLORIDE 0.9 % IV SOLN
2.0000 g | INTRAVENOUS | Status: DC
  Administered 2023-08-30 – 2023-09-02 (×4): 2 g via INTRAVENOUS
  Filled 2023-08-30 (×6): qty 20

## 2023-08-30 MED ORDER — INSULIN ASPART 100 UNIT/ML IJ SOLN
0.0000 [IU] | Freq: Three times a day (TID) | INTRAMUSCULAR | Status: DC
  Administered 2023-08-30: 2 [IU] via SUBCUTANEOUS
  Administered 2023-08-31 – 2023-09-01 (×3): 3 [IU] via SUBCUTANEOUS
  Administered 2023-09-01: 5 [IU] via SUBCUTANEOUS
  Administered 2023-09-02: 2 [IU] via SUBCUTANEOUS
  Administered 2023-09-02 (×2): 3 [IU] via SUBCUTANEOUS
  Administered 2023-09-03 (×3): 2 [IU] via SUBCUTANEOUS
  Administered 2023-09-04: 11 [IU] via SUBCUTANEOUS
  Administered 2023-09-04: 5 [IU] via SUBCUTANEOUS
  Filled 2023-08-30 (×12): qty 1

## 2023-08-30 MED ORDER — METHYLPREDNISOLONE SODIUM SUCC 40 MG IJ SOLR
40.0000 mg | Freq: Two times a day (BID) | INTRAMUSCULAR | Status: DC
  Administered 2023-08-30: 40 mg via INTRAVENOUS
  Filled 2023-08-30: qty 1

## 2023-08-30 MED ORDER — SODIUM CHLORIDE 0.9 % IV SOLN: INTRAVENOUS | Status: AC

## 2023-08-30 MED ORDER — SODIUM CHLORIDE 0.9 % IV BOLUS
1000.0000 mL | Freq: Once | INTRAVENOUS | Status: AC
  Administered 2023-08-30: 1000 mL via INTRAVENOUS

## 2023-08-30 MED ORDER — IPRATROPIUM-ALBUTEROL 0.5-2.5 (3) MG/3ML IN SOLN
RESPIRATORY_TRACT | Status: AC
  Administered 2023-08-30: 9 mL via RESPIRATORY_TRACT
  Filled 2023-08-30: qty 9

## 2023-08-30 MED ORDER — SIMVASTATIN 20 MG PO TABS
40.0000 mg | ORAL_TABLET | Freq: Every day | ORAL | Status: DC
  Administered 2023-08-30 – 2023-09-04 (×6): 40 mg via ORAL
  Filled 2023-08-30: qty 4
  Filled 2023-08-30 (×4): qty 2
  Filled 2023-08-30: qty 4

## 2023-08-30 MED ORDER — CLOPIDOGREL BISULFATE 75 MG PO TABS
75.0000 mg | ORAL_TABLET | Freq: Every day | ORAL | Status: DC
  Administered 2023-08-30 – 2023-09-04 (×6): 75 mg via ORAL
  Filled 2023-08-30 (×6): qty 1

## 2023-08-30 MED ORDER — ACETAMINOPHEN 325 MG PO TABS
650.0000 mg | ORAL_TABLET | Freq: Four times a day (QID) | ORAL | Status: DC | PRN
  Administered 2023-08-31 – 2023-09-04 (×2): 650 mg via ORAL
  Filled 2023-08-30 (×2): qty 2

## 2023-08-30 MED ORDER — FLUTICASONE FUROATE-VILANTEROL 100-25 MCG/ACT IN AEPB
1.0000 | INHALATION_SPRAY | Freq: Every day | RESPIRATORY_TRACT | Status: DC
  Administered 2023-08-30 – 2023-09-04 (×6): 1 via RESPIRATORY_TRACT
  Filled 2023-08-30: qty 28

## 2023-08-30 MED ORDER — ROFLUMILAST 500 MCG PO TABS
500.0000 ug | ORAL_TABLET | Freq: Every day | ORAL | Status: DC
  Administered 2023-08-30 – 2023-09-04 (×6): 500 ug via ORAL
  Filled 2023-08-30 (×6): qty 1

## 2023-08-30 MED ORDER — ONDANSETRON 4 MG PO TBDP: 4.0000 mg | ORAL_TABLET | Freq: Three times a day (TID) | ORAL | Status: DC | PRN

## 2023-08-30 MED ORDER — GABAPENTIN 400 MG PO CAPS
800.0000 mg | ORAL_CAPSULE | Freq: Four times a day (QID) | ORAL | Status: DC
  Administered 2023-08-30 – 2023-08-31 (×4): 800 mg via ORAL
  Filled 2023-08-30 (×5): qty 2

## 2023-08-30 MED ORDER — ONDANSETRON HCL 4 MG PO TABS: 4.0000 mg | ORAL_TABLET | Freq: Four times a day (QID) | ORAL | Status: DC | PRN

## 2023-08-30 MED ORDER — ACETAMINOPHEN 500 MG PO TABS
1000.0000 mg | ORAL_TABLET | Freq: Once | ORAL | Status: AC
  Administered 2023-08-30: 1000 mg via ORAL
  Filled 2023-08-30: qty 2

## 2023-08-30 MED ORDER — DULOXETINE HCL 30 MG PO CPEP
30.0000 mg | ORAL_CAPSULE | Freq: Every day | ORAL | Status: DC
  Administered 2023-08-30 – 2023-09-04 (×6): 30 mg via ORAL
  Filled 2023-08-30 (×6): qty 1

## 2023-08-30 MED ORDER — FLUTICASONE PROPIONATE 50 MCG/ACT NA SUSP: 1.0000 | Freq: Every day | NASAL | Status: DC | PRN

## 2023-08-30 MED ORDER — AZELASTINE HCL 0.1 % NA SOLN: 2.0000 | Freq: Two times a day (BID) | NASAL | Status: DC | PRN

## 2023-08-30 MED ORDER — EZETIMIBE 10 MG PO TABS
10.0000 mg | ORAL_TABLET | Freq: Every day | ORAL | Status: DC
  Administered 2023-08-30 – 2023-09-04 (×6): 10 mg via ORAL
  Filled 2023-08-30 (×6): qty 1

## 2023-08-30 MED ORDER — PANTOPRAZOLE SODIUM 40 MG PO TBEC
40.0000 mg | DELAYED_RELEASE_TABLET | Freq: Every day | ORAL | Status: DC
  Administered 2023-08-31 – 2023-09-04 (×5): 40 mg via ORAL
  Filled 2023-08-30 (×5): qty 1

## 2023-08-30 MED ORDER — ASPIRIN 81 MG PO TBEC
81.0000 mg | DELAYED_RELEASE_TABLET | Freq: Every day | ORAL | Status: DC
  Administered 2023-08-30 – 2023-09-04 (×6): 81 mg via ORAL
  Filled 2023-08-30 (×6): qty 1

## 2023-08-30 MED ORDER — DIPHENHYDRAMINE HCL 50 MG/ML IJ SOLN
25.0000 mg | Freq: Once | INTRAMUSCULAR | Status: AC
  Administered 2023-08-30: 25 mg via INTRAVENOUS
  Filled 2023-08-30: qty 1

## 2023-08-30 MED ORDER — ALLOPURINOL 100 MG PO TABS
600.0000 mg | ORAL_TABLET | Freq: Every day | ORAL | Status: DC
  Administered 2023-08-30 – 2023-09-04 (×6): 600 mg via ORAL
  Filled 2023-08-30 (×3): qty 6
  Filled 2023-08-30 (×2): qty 2
  Filled 2023-08-30: qty 6

## 2023-08-30 MED ORDER — ONDANSETRON HCL 4 MG/2ML IJ SOLN: 4.0000 mg | Freq: Four times a day (QID) | INTRAMUSCULAR | Status: DC | PRN

## 2023-08-30 MED ORDER — IOHEXOL 350 MG/ML SOLN
75.0000 mL | Freq: Once | INTRAVENOUS | Status: AC | PRN
  Administered 2023-08-30: 75 mL via INTRAVENOUS

## 2023-08-30 MED ORDER — BUPRENORPHINE HCL-NALOXONE HCL 8-2 MG SL SUBL
1.0000 | SUBLINGUAL_TABLET | Freq: Every day | SUBLINGUAL | Status: DC
  Administered 2023-08-30: 1 via SUBLINGUAL
  Filled 2023-08-30: qty 1

## 2023-08-30 MED ORDER — SODIUM CHLORIDE 0.9 % IV SOLN
2.0000 g | Freq: Once | INTRAVENOUS | Status: AC
  Administered 2023-08-30: 2 g via INTRAVENOUS
  Filled 2023-08-30: qty 12.5

## 2023-08-30 MED ORDER — GUAIFENESIN ER 600 MG PO TB12
600.0000 mg | ORAL_TABLET | Freq: Two times a day (BID) | ORAL | Status: DC
  Administered 2023-08-30 – 2023-09-04 (×10): 600 mg via ORAL
  Filled 2023-08-30 (×11): qty 1

## 2023-08-30 NOTE — ED Triage Notes (Signed)
Pt in brought by EMS from home for difficulty breathing since last night. Per EMS, pt satting 86% on 6L O2 on their arrival. Pt given 2 duoneb treatments, 125mg  solumedrol., 2g magnesium, and placed on CPAP by EMS.

## 2023-08-30 NOTE — ED Notes (Signed)
Report called to Molli Hazard, RN

## 2023-08-30 NOTE — Progress Notes (Signed)
Rt assisted with patient transport to CT and back to ER with no complications. PT transported on V60.

## 2023-08-30 NOTE — Consult Note (Signed)
CODE SEPSIS - PHARMACY COMMUNICATION  **Broad Spectrum Antibiotics should be administered within 1 hour of Sepsis diagnosis**  Time Code Sepsis Called/Page Received: 1141  Antibiotics Ordered: cefepime, azithromycin  Time of 1st antibiotic administration: 1157  Additional action taken by pharmacy: n/a  If necessary, Name of Provider/Nurse Contacted: none    Bettey Costa ,PharmD Clinical Pharmacist  08/30/2023  12:03 PM

## 2023-08-30 NOTE — ED Provider Notes (Addendum)
[facility] Provider Note    Event Date/Time   First MD Initiated Contact with Patient 08/30/23 1055     (approximate)   History   Shortness of Breath   HPI  Christian Fields is a 67 y.o. male past medical history significant for COPD on chronic 4 L of home oxygen who presents to the emergency department shortness of breath.  Significant shortness of breath that started just prior to arrival.  When EMS arrived he was 83% on 6 L nasal cannula.  Does endorse cough or shortness of breath.  Sharp chest pain to his right lower side.  Denies any nausea, vomiting or diarrhea.  Denies fever or chills.  Denies history of DVT or PE.  Denies any cardiac history.  Patient is uncertain if he would want intubated and states that he wants to talk to his children.  With EMS patient received 2 DuoNeb treatments, CPAP, IV Solu-Medrol 125 mg, 2 g of IV magnesium     Physical Exam   Triage Vital Signs: ED Triage Vitals  Encounter Vitals Group     BP      Systolic BP Percentile      Diastolic BP Percentile      Pulse      Resp      Temp      Temp src      SpO2      Weight      Height      Head Circumference      Peak Flow      Pain Score      Pain Loc      Pain Education      Exclude from Growth Chart     Most recent vital signs: Vitals:   08/30/23 1057 08/30/23 1139  BP: 102/82 99/72  Pulse: (!) 135 (!) 135  Resp:  19  Temp:  (!) 101 F (38.3 C)  SpO2: 96% 99%    Physical Exam Constitutional:      General: He is in acute distress.     Appearance: He is well-developed. He is ill-appearing.  HENT:     Head: Atraumatic.  Eyes:     Conjunctiva/sclera: Conjunctivae normal.  Cardiovascular:     Rate and Rhythm: Regular rhythm.  Pulmonary:     Effort: Respiratory distress present.     Comments: Placed on BiPAP, poor air movement throughout lung fields Musculoskeletal:     Cervical back: Normal range of motion.     Right lower leg: No edema.      Left lower leg: No edema.  Skin:    General: Skin is warm.     Capillary Refill: Capillary refill takes 2 to 3 seconds.  Neurological:     General: No focal deficit present.     Mental Status: He is alert. Mental status is at baseline.  Psychiatric:        Mood and Affect: Mood normal.     IMPRESSION / MDM / ASSESSMENT AND PLAN / ED COURSE  I reviewed the triage vital signs and the nursing notes.  Differential diagnosis including COPD exacerbation, pulmonary edema, pneumonia, pulmonary embolism, ACS  EKG  I, Corena Herter, the attending physician, personally viewed and interpreted this ECG.  Sinus tachycardia with a heart rate of 135.  Prolonged PR interval.  Prolonged QTc.  Nonspecific ST changes.  No significant ST elevation or depression.  No signs of acute ischemia.  Sinus tachycardia while on cardiac telemetry.  RADIOLOGY I independently reviewed imaging, my interpretation of imaging: Chest x-ray with findings being significant with COPD but no obvious acute focal findings consistent with pneumonia.  CTA obtained to further evaluate for pulmonary embolism.  LABS (all labs ordered are listed, but only abnormal results are displayed) Labs interpreted as -    Labs Reviewed  COMPREHENSIVE METABOLIC PANEL - Abnormal; Notable for the following components:      Result Value   Glucose, Bld 131 (*)    Albumin 3.3 (*)    All other components within normal limits  CBC WITH DIFFERENTIAL/PLATELET - Abnormal; Notable for the following components:   WBC 41.2 (*)    RBC 3.36 (*)    Hemoglobin 9.9 (*)    HCT 33.3 (*)    MCHC 29.7 (*)    RDW 15.6 (*)    Neutro Abs 37.1 (*)    Monocytes Absolute 1.6 (*)    Basophils Absolute 0.2 (*)    Abs Immature Granulocytes 0.60 (*)    All other components within normal limits  BLOOD GAS, VENOUS - Abnormal; Notable for the following components:   pO2, Ven 75 (*)    All other components within normal limits  BRAIN NATRIURETIC PEPTIDE -  Abnormal; Notable for the following components:   B Natriuretic Peptide 198.6 (*)    All other components within normal limits  CULTURE, BLOOD (SINGLE)  CULTURE, BLOOD (SINGLE)  SARS CORONAVIRUS 2 BY RT PCR  LACTIC ACID, PLASMA  PROTIME-INR  APTT  TROPONIN I (HIGH SENSITIVITY)     MDM  Immediately placed on BiPAP on arrival and given continuous DuoNebs.  Received Solu-Medrol, magnesium DuoNebs prior to arrival.  Blood cultures obtained given his tachycardia.  Given no signs of a fluid overload given 1 L of IV fluids, felt 30 cc/kg of IV fluids would be detrimental to the patient given his significant dyspnea, will give 1 L and reevaluate.  Started on IV antibiotics to cover for community-acquired pneumonia with Pseudomonas risk factors given his significant leukocytosis.  Notified that the patient was febrile, ordered Tylenol.  CTA pending for further evaluation for pulmonary embolism/pneumonia.  COVID testing is pending.  Troponin negative and have low BNP.  Consulted hospitalist for acute hypoxic respiratory failure in the setting of COPD exacerbation/community-acquired pneumonia  Initially started on cefepime and azithromycin for community-acquired pneumonia -allergic reaction to azithromycin, discontinued and given IV doxycycline.     PROCEDURES:  Critical Care performed: yes  .Critical Care  Performed by: Corena Herter, MD Authorized by: Corena Herter, MD   Critical care provider statement:    Critical care time (minutes):  50   Critical care time was exclusive of:  Separately billable procedures and treating other patients   Critical care was necessary to treat or prevent imminent or life-threatening deterioration of the following conditions:  Respiratory failure   Critical care was time spent personally by me on the following activities:  Development of treatment plan with patient or surrogate, discussions with consultants, evaluation of patient's response to treatment,  examination of patient, ordering and review of laboratory studies, ordering and review of radiographic studies, ordering and performing treatments and interventions, pulse oximetry, re-evaluation of patient's condition and review of old charts   Patient's presentation is most consistent with acute presentation with potential threat to life or bodily function.   MEDICATIONS ORDERED IN ED: Medications  lactated ringers infusion ( Intravenous New Bag/Given 08/30/23 1156)  acetaminophen (TYLENOL) tablet 1,000 mg (has no administration in time range)  doxycycline (VIBRAMYCIN) 100 mg in sodium chloride 0.9 % 250 mL IVPB (has no administration in time range)  sodium chloride 0.9 % bolus 1,000 mL (has no administration in time range)  ipratropium-albuterol (DUONEB) 0.5-2.5 (3) MG/3ML nebulizer solution 9 mL (9 mLs Nebulization Given 08/30/23 1130)  sodium chloride 0.9 % bolus 1,000 mL (1,000 mLs Intravenous New Bag/Given 08/30/23 1137)  ceFEPIme (MAXIPIME) 2 g in sodium chloride 0.9 % 100 mL IVPB (2 g Intravenous New Bag/Given 08/30/23 1157)  azithromycin (ZITHROMAX) 500 mg in sodium chloride 0.9 % 250 mL IVPB (0 mg Intravenous Stopped 08/30/23 1205)  iohexol (OMNIPAQUE) 350 MG/ML injection 75 mL (75 mLs Intravenous Contrast Given 08/30/23 1223)  diphenhydrAMINE (BENADRYL) injection 25 mg (25 mg Intravenous Given 08/30/23 1211)    FINAL CLINICAL IMPRESSION(S) / ED DIAGNOSES   Final diagnoses:  COPD exacerbation (HCC)  Hypoxia  Community acquired pneumonia, unspecified laterality     Rx / DC Orders   ED Discharge Orders     None        Note:  This document was prepared using Dragon voice recognition software and may include unintentional dictation errors.   Corena Herter, MD 08/30/23 1242    Corena Herter, MD 08/30/23 1242

## 2023-08-30 NOTE — H&P (Signed)
History and Physical    Christian Fields WJX:[id number] DOB: 1956/03/07 DOA: 08/30/2023  PCP: Alease Medina, MD (Confirm with patient/family/NH records and if not entered, this has to be entered at [facility] point of entry) Patient coming from: Home  I have personally briefly reviewed patient's old medical records in [facility]  Chief Complaint: Cough, wheezing, SOB, fever  HPI: Christian Fields is a 67 y.o. male with medical history significant of COPD Gold stage IV, chronic hypoxic respiratory failure on 4 L continuously, HTN, HLD, IIDM, OSA not on CPAP, PVD on aspirin Plavix chronic pain syndrome on Suboxone non-small cell lung carcinoma status post treatment, COPD stage II, chronic HFpEF, diabetic neuropathy, presented with worsening of wheezing cough shortness of breath and fever.  Patient reported that he ran out of 1 of his nebulizing treatment on Wednesday and started to have wheezing and cough next day became worse yesterday, started to have subjective fever and chills and he tried to turn up oxygen to 6 L this morning to compensate but did right little help.  Also discussed the case with daughter over the phone and daughter reported the patient did run out of one of the breathing medications but she does know specifically which 1 recently and started to have trouble breathing since Wednesday when he was seen by the visiting nurse yesterday. ED Course: BiPAP 100% on 60% of oxygen, temperature 101 and borderline tachycardia borderline hypoxic.  WBC 40, with bandemia, hemoglobin 9.9, ABG 7.2 8/56/75, creatinine 1.2 bicarb 24.  Lactic acid 1.6  CT angiogram negative for PE but multifocal pneumonia predominantly on the right mid lobe and right lower lobe.  Patient was started on cefepime and doxycycline and IV bolus x 1.  Review of Systems: Unable to perform patient has significant respiratory stress on a facemask  Past Medical History:  Diagnosis Date   Anemia    Anxiety    Arthritis     Asthma    Cancer (HCC)    Basal Cell Skin Cancer   Chronic back pain    COPD (chronic obstructive pulmonary disease) (HCC)    Depression    Diabetes mellitus (HCC)    Dyspnea    GERD (gastroesophageal reflux disease)    Gout    Gout    Headache    History of blood clots    Left Leg--July 2018   History of kidney stones    Hyperlipidemia    Hyperlipidemia    Hypertension    Kidney stones    Neuropathy    On home oxygen therapy    2 L / M   Pneumonia 06/2017   Sleep apnea    Ulcer of foot (HCC)    Right    Past Surgical History:  Procedure Laterality Date   APPENDECTOMY     DG FEET 2 VIEWS BILAT     IRRIGATION AND DEBRIDEMENT FOOT Left 07/11/2023   Procedure: IRRIGATION AND DEBRIDEMENT FOOT;  Surgeon: Gwyneth Revels, DPM;  Location: [facility] ORS;  Service: Orthopedics/Podiatry;  Laterality: Left;   LIPOMA EXCISION Right 08/15/2017   Procedure: EXCISION TUMOR(CYST) FOOT;  Surgeon: Recardo Evangelist, DPM;  Location: [facility] ORS;  Service: Podiatry;  Laterality: Right;   LOWER EXTREMITY ANGIOGRAPHY Left 07/03/2023   Procedure: Lower Extremity Angiography;  Surgeon: Annice Needy, MD;  Location: [facility] INVASIVE CV LAB;  Service: Cardiovascular;  Laterality: Left;   OTHER SURGICAL HISTORY Bilateral Foot surgery     reports that he has quit smoking. His smoking  use included cigarettes. He started smoking about 6 years ago. He has a 3.4 pack-year smoking history. He has never used smokeless tobacco. He reports current alcohol use of about 1.0 standard drink of alcohol per week. He reports that he does not use drugs.  Allergies  Allergen Reactions   Bee Venom Anaphylaxis   Linezolid Other (See Comments)    Thrombocytopenia, hyperlactatemia   Azithromycin Itching and Rash    Family History  Problem Relation Age of Onset   Hypertension Mother      Prior to Admission medications   Medication Sig Start Date End Date Taking? Authorizing Provider  acetaminophen (TYLENOL) 325 MG tablet Take  650 mg by mouth every 6 (six) hours as needed.    [provider]  albuterol (PROVENTIL) (2.5 MG/3ML) 0.083% nebulizer solution Take 2.5 mg by nebulization every 4 (four) hours as needed. 05/29/23   [provider]  albuterol (VENTOLIN HFA) 108 (90 Base) MCG/ACT inhaler Inhale 2 puffs into the lungs every 4 (four) hours as needed for wheezing or shortness of breath.    [provider]  allopurinol (ZYLOPRIM) 300 MG tablet Take 600 mg by mouth daily. 02/24/20   [provider]  aspirin EC 81 MG tablet Take 1 tablet by mouth daily.    [provider]  azelastine (ASTELIN) 0.1 % nasal spray Place 2 sprays into both nostrils 2 (two) times daily. 05/27/23   [provider]  buprenorphine-naloxone (SUBOXONE) 8-2 mg SUBL SL tablet Place 1 tablet under the tongue in the morning, at noon, and at bedtime.    [provider]  cholecalciferol (VITAMIN D) 1000 units tablet Take 1,000 Units by mouth daily.    [provider]  clopidogrel (PLAVIX) 75 MG tablet Take 1 tablet (75 mg total) by mouth daily. 02/02/21   Willeen Niece, MD  cyanocobalamin (VITAMIN B12) 1000 MCG/ML injection Inject 1 mL (1,000 mcg total) into the muscle every 30 (thirty) days. 07/12/22   Earna Coder, MD  DULoxetine (CYMBALTA) 30 MG capsule Take 1 capsule by mouth daily. 01/17/16   [provider]  ezetimibe (ZETIA) 10 MG tablet Take 10 mg by mouth daily. 02/24/20   [provider]  FARXIGA 10 MG TABS tablet Take 10 mg by mouth daily. 09/14/20   [provider]  fluticasone (FLONASE) 50 MCG/ACT nasal spray Place 1 spray into both nostrils daily as needed for allergies. 10/16/18   [provider]  Fluticasone-Umeclidin-Vilant (TRELEGY ELLIPTA) 100-62.5-25 MCG/ACT AEPB Inhale into the lungs. 09/12/21   [provider]  gabapentin (NEURONTIN) 800 MG tablet Take 800 mg by mouth 4 (four) times daily. 07/14/21   [provider]  metFORMIN (GLUCOPHAGE) 1000 MG tablet Take 1,000 mg by mouth 2 (two) times daily with a meal.    [provider]  metoprolol succinate (TOPROL-XL) 50 MG 24 hr tablet Take 50 mg by mouth daily. 02/24/20   [provider]  ondansetron (ZOFRAN-ODT) 4 MG disintegrating tablet Take 4 mg by mouth every 8 (eight) hours as needed for nausea.    [provider]  OXYGEN Inhale 2 L into the lungs.    [provider]  pantoprazole (PROTONIX) 40 MG tablet Take 40 mg by mouth daily. 06/20/21   [provider]  roflumilast (DALIRESP) 500 MCG TABS tablet Take 500 mcg by mouth daily. 08/15/21   [provider]  simvastatin (ZOCOR) 40 MG tablet Take 1 tablet by mouth daily. 01/17/16   [provider]  Syringe/Needle, Disp, (SYRINGE 3CC/25GX1") 25G X 1" 3 ML MISC 1 Syringe by Does not apply route every 30 (thirty) days. 07/12/22   Earna Coder, MD    Physical Exam: Vitals:   08/30/23 1330 08/30/23 1400 08/30/23 1406 08/30/23 1407  BP: (!) 88/48 (!) 65/36 (!) 73/40 (!) 76/39  Pulse: (!) 106 (!) 103 (!) 102 (!) 103  Resp:      Temp:      TempSrc:      SpO2: 100% 100% 100% 100%  Weight:      Height:        Constitutional: NAD, calm, comfortable Vitals:   08/30/23 1330 08/30/23 1400 08/30/23 1406 08/30/23 1407  BP: (!) 88/48 (!) 65/36 (!) 73/40 (!) 76/39  Pulse: (!) 106 (!) 103 (!) 102 (!) 103  Resp:      Temp:      TempSrc:      SpO2: 100% 100% 100% 100%  Weight:      Height:       Eyes: PERRL, lids and conjunctivae normal ENMT: Mucous membranes are moist. Posterior pharynx clear of any exudate or lesions.Normal dentition.  Neck: normal, supple, no masses, no thyromegaly Respiratory: Diminished breathing sound bilaterally, scattered wheezing, no crackles.  Talking in broken sentences, increasing breathing effort No accessory muscle use.  Cardiovascular: Regular rate and rhythm, no murmurs / rubs / gallops. No extremity edema. 2+  pedal pulses. No carotid bruits.  Abdomen: no tenderness, no masses palpated. No hepatosplenomegaly. Bowel sounds positive.  Musculoskeletal: no clubbing / cyanosis. No joint deformity upper and lower extremities. Good ROM, no contractures. Normal muscle tone.  Skin: no rashes, lesions, ulcers. No induration Neurologic: CN 2-12 grossly intact. Sensation intact, DTR normal. Strength 5/5 in all 4.  Psychiatric: Normal judgment and insight. Alert and oriented x 3. Normal mood.     Labs on Admission: I have personally reviewed following labs and imaging studies  CBC: Recent Labs  Lab 08/30/23 1103  WBC 41.2*  NEUTROABS 37.1*  HGB 9.9*  HCT 33.3*  MCV 99.1  PLT 326   Basic Metabolic Panel: Recent Labs  Lab 08/30/23 1103  NA 135  K 4.2  CL 102  CO2 24  GLUCOSE 131*  BUN 19  CREATININE 1.24  CALCIUM 9.0   GFR: Estimated Creatinine Clearance: 55.2 mL/min (by C-G formula based on SCr of 1.24 mg/dL). Liver Function Tests: Recent Labs  Lab 08/30/23 1103  AST 17  ALT 13  ALKPHOS 117  BILITOT 0.5  PROT 8.0  ALBUMIN 3.3*   No results for input(s): "LIPASE", "AMYLASE" in the last 168 hours. No results for input(s): "AMMONIA" in the last 168 hours. Coagulation Profile: Recent Labs  Lab 08/30/23 1103  INR 1.0   Cardiac Enzymes: No results for input(s): "CKTOTAL", "CKMB", "CKMBINDEX", "TROPONINI" in the last 168 hours. BNP (last 3 results) No results for input(s): "PROBNP" in the last 8760 hours. HbA1C: No results for input(s): "HGBA1C" in the last 72 hours. CBG: No results for input(s): "GLUCAP" in the last 168 hours. Lipid Profile: No results for input(s): "CHOL", "HDL", "LDLCALC", "TRIG", "CHOLHDL", "LDLDIRECT" in the last 72 hours. Thyroid Function Tests: No results for input(s): "TSH", "T4TOTAL", "FREET4", "T3FREE", "THYROIDAB" in the last 72 hours. Anemia Panel: No results for input(s): "VITAMINB12", "FOLATE", "FERRITIN", "TIBC", "IRON", "RETICCTPCT" in the  last 72 hours. Urine analysis:    Component Value Date/Time   COLORURINE YELLOW (A) 07/02/2023 1406   APPEARANCEUR CLEAR (A) 07/02/2023 1406   APPEARANCEUR Clear  03/24/2013 1853   LABSPEC 1.009 07/02/2023 1406   LABSPEC 1.020 03/24/2013 1853   PHURINE 6.0 07/02/2023 1406   GLUCOSEU NEGATIVE 07/02/2023 1406   GLUCOSEU Negative 03/24/2013 1853   HGBUR LARGE (A) 07/02/2023 1406   BILIRUBINUR NEGATIVE 07/02/2023 1406   BILIRUBINUR Negative 03/24/2013 1853   KETONESUR NEGATIVE 07/02/2023 1406   PROTEINUR NEGATIVE 07/02/2023 1406   NITRITE NEGATIVE 07/02/2023 1406   LEUKOCYTESUR NEGATIVE 07/02/2023 1406   LEUKOCYTESUR Negative 03/24/2013 1853    Radiological Exams on Admission: CT Angio Chest PE W/Cm &/Or Wo Cm  Result Date: 08/30/2023 CLINICAL DATA:  Difficulty breathing since last night with hypoxia. Possible pulmonary embolism. History of lung cancer. EXAM: CT ANGIOGRAPHY CHEST WITH CONTRAST TECHNIQUE: Multidetector CT imaging of the chest was performed using the standard protocol during bolus administration of intravenous contrast. Multiplanar CT image reconstructions and MIPs were obtained to evaluate the vascular anatomy. RADIATION DOSE REDUCTION: This exam was performed according to the departmental dose-optimization program which includes automated exposure control, adjustment of the mA and/or kV according to patient size and/or use of iterative reconstruction technique. CONTRAST:  75mL OMNIPAQUE IOHEXOL 350 MG/ML SOLN COMPARISON:  08/26/2023, 01/24/2023 FINDINGS: Cardiovascular: Heart is normal size. Calcified plaque over the left main, left anterior descending and lateral circumflex coronary arteries. Mild calcification of the aortic root. Thoracic aorta is normal in caliber. There is mild calcified plaque over the descending thoracic aorta. Pulmonary arterial system is well opacified without evidence of emboli. Remaining vascular structures are unremarkable. Mediastinum/Nodes: Evidence  mediastinal and bilateral hilar adenopathy. Precarinal lymph node measuring 1.5 cm and subcarinal lymph node measuring 0.7 cm. A couple right hilar lymph nodes measure 1.5 cm. These findings have progressed slightly compared to the prior exam. Remaining mediastinal structures are unremarkable. Lungs/Pleura: The lungs are adequately inflated demonstrate moderate centrilobular emphysematous disease. Stable sub pleural irregular masslike opacity measuring 1.6 x 1.9 cm thought to represent post treatment change of the region patient's original left lung cancer. Interval development of patchy peripheral nodular airspace process predominately within the right lung and only minimally in the left lung suggesting acute infection. No significant effusion. Airways are unremarkable. Upper Abdomen: Calcified plaque of the abdominal aorta. No acute findings. Several bilateral renal cysts unchanged with the larger measuring 2.8 cm of the left midpole. No follow-up imaging is recommended. Musculoskeletal: Stable mild compression fracture over the upper thoracic spine. Old sternal fracture. Review of the MIP images confirms the above findings. IMPRESSION: 1. No evidence of pulmonary embolism. 2. Interval development of patchy peripheral nodular airspace process predominately within the right lung and only minimally in the left lung suggesting acute infection. 3. Stable sub pleural irregular masslike opacity measuring 1.6 x 1.9 cm thought to represent post treatment change in the region patient's original left lung cancer. 4. Mediastinal and bilateral hilar adenopathy slightly progressed compared to the prior exam and may be reactive to patient's suspected pulmonary infection as described above. 5. Aortic atherosclerosis. Atherosclerotic coronary artery disease. 6. Stable bilateral renal cysts. Aortic Atherosclerosis (ICD10-I70.0) and Emphysema (ICD10-J43.9). Electronically Signed   By: Elberta Fortis M.D.   On: 08/30/2023 13:04    DG Chest Port 1 View  Result Date: 08/30/2023 CLINICAL DATA:  66 year old male with possible sepsis. EXAM: PORTABLE CHEST 1 VIEW COMPARISON:  Chest x-ray 07/02/2023. FINDINGS: Diffuse interstitial prominence and widespread peribronchial cuffing, similar to prior studies, presumably reflective of chronic bronchitis. 2.8 x 2.6 cm nodular density in the periphery of the left upper lobe similar to prior examinations,  corresponding to treated left upper lobe neoplasm. Emphysematous changes. No definite acute consolidative airspace disease. No pleural effusions. Heart size is normal. Upper mediastinal contours are within normal limits. IMPRESSION: 1. No definite radiographic evidence of acute cardiopulmonary disease. 2. Emphysematous changes in the lungs with diffuse interstitial prominence and peribronchial cuffing, similar to prior studies, likely reflective of COPD. 3. Persistent nodular area of architectural distortion in the periphery of the left upper lobe, similar to the prior examination, corresponding to treated neoplasm. Correlation with results of recently obtained chest CT is recommended. 4. Aortic atherosclerosis. Electronically Signed   By: Trudie Reed M.D.   On: 08/30/2023 11:19    EKG: Independently reviewed.  Sinus tachycardia, no acute ST changes.  Assessment/Plan Principal Problem:   Sepsis (HCC) Active Problems:   Multifocal pneumonia  (please populate well all problems here in Problem List. (For example, if patient is on BP meds at home and you resume or decide to hold them, it is a problem that needs to be her. Same for CAD, COPD, HLD and so on)  Sepsis -Evidenced by tachycardia, leukocytosis, new onset of fever, source of infection is multifocal pneumonia mainly on the right mid and lower lobe -Continue fluid resuscitation, 1 more liter IV bolus ordered to make a total of 2 L and start maintenance IV fluid -Continue to wean off BiPAP, admit to stepdown unit -Antibiotic  coverage for CAP, ceftriaxone and doxycycline (patient developed itchiness after azithromycin IV in the ED)  Acute on chronic hypoxic respiratory failure -Likely secondary to combined effect of multifocal pneumonia and acute COPD exacerbation.  Pneumonia management as above -For COPD treatment, IV Solu-Medrol 125 mg x 1, continue Solu-Medrol twice daily, ICS and LABA and DuoNebs every 6 hours plus albuterol as needed -Incentive spirometry -Guaifenesin -Flutter valve, -Culture sputum, atypical pneumonia study  PVD -No acute concern, continue aspirin and Plavix  IIDM -Family report that the patient off Farxiga -Will start SSI  Chronic HFpEF -Volume depleted, IV fluid monitor volume status  Chronic pain syndrome -Continue Suboxone   DVT prophylaxis: Lovenox Code Status: Full Code Family Communication: Daughter over the phone Disposition Plan: Patient is sick with pneumonia and sepsis, requiring IV antibiotics BiPAP support and expect more than 2 midnight hospital stay Consults called: None Admission status: Stepdown   Emeline General MD [facility] Pager [phone]  If 7PM-7AM, please contact night-coverage [email or web address] Password [name]  08/30/2023, 2:18 PM

## 2023-08-30 NOTE — Progress Notes (Signed)
Pt taken off bipap and placed on [place] at 3L, respiratory rate 18/min, sats 97%, HR 100

## 2023-08-30 NOTE — Sepsis Progress Note (Signed)
Sepsis protocol is being followed by [facility]. 

## 2023-08-31 DIAGNOSIS — J441 Chronic obstructive pulmonary disease with (acute) exacerbation: Secondary | ICD-10-CM | POA: Diagnosis not present

## 2023-08-31 DIAGNOSIS — J9621 Acute and chronic respiratory failure with hypoxia: Secondary | ICD-10-CM

## 2023-08-31 DIAGNOSIS — J189 Pneumonia, unspecified organism: Secondary | ICD-10-CM | POA: Diagnosis not present

## 2023-08-31 LAB — CBC
HCT: 26.3 % — ABNORMAL LOW (ref 39.0–52.0)
Hemoglobin: 7.8 g/dL — ABNORMAL LOW (ref 13.0–17.0)
MCH: 29.3 pg (ref 26.0–34.0)
MCHC: 29.7 g/dL — ABNORMAL LOW (ref 30.0–36.0)
MCV: 98.9 fL (ref 80.0–100.0)
Platelets: 211 10*3/uL (ref 150–400)
RBC: 2.66 MIL/uL — ABNORMAL LOW (ref 4.22–5.81)
RDW: 15.7 % — ABNORMAL HIGH (ref 11.5–15.5)
WBC: 35.8 10*3/uL — ABNORMAL HIGH (ref 4.0–10.5)
nRBC: 0 % (ref 0.0–0.2)

## 2023-08-31 LAB — BASIC METABOLIC PANEL
Anion gap: 6 (ref 5–15)
BUN: 22 mg/dL (ref 8–23)
CO2: 23 mmol/L (ref 22–32)
Calcium: 7.9 mg/dL — ABNORMAL LOW (ref 8.9–10.3)
Chloride: 109 mmol/L (ref 98–111)
Creatinine, Ser: 1.08 mg/dL (ref 0.61–1.24)
GFR, Estimated: >60 mL/min (ref >60)
Glucose, Bld: 204 mg/dL — ABNORMAL HIGH (ref 70–99)
Potassium: 4.8 mmol/L (ref 3.5–5.1)
Sodium: 138 mmol/L (ref 135–145)

## 2023-08-31 LAB — GLUCOSE, CAPILLARY
Glucose-Capillary: 200 mg/dL — ABNORMAL HIGH (ref 70–99)
Glucose-Capillary: 181 mg/dL — ABNORMAL HIGH (ref 70–99)
Glucose-Capillary: 179 mg/dL — ABNORMAL HIGH (ref 70–99)
Glucose-Capillary: 188 mg/dL — ABNORMAL HIGH (ref 70–99)

## 2023-08-31 MED ORDER — BUPRENORPHINE HCL-NALOXONE HCL 8-2 MG SL SUBL
1.0000 | SUBLINGUAL_TABLET | Freq: Three times a day (TID) | SUBLINGUAL | Status: DC
  Administered 2023-08-31: 1 via SUBLINGUAL
  Filled 2023-08-31: qty 1

## 2023-08-31 MED ORDER — SODIUM CHLORIDE 0.9 % IV SOLN
100.0000 mg | Freq: Two times a day (BID) | INTRAVENOUS | Status: DC
  Administered 2023-08-31 – 2023-09-03 (×6): 100 mg via INTRAVENOUS
  Filled 2023-08-31 (×8): qty 100

## 2023-08-31 MED ORDER — SODIUM CHLORIDE 0.9 % IV SOLN
100.0000 mg | Freq: Once | INTRAVENOUS | Status: AC
  Administered 2023-08-31: 100 mg via INTRAVENOUS
  Filled 2023-08-31: qty 100

## 2023-08-31 MED ORDER — BUPRENORPHINE HCL-NALOXONE HCL 8-2 MG SL SUBL
1.0000 | SUBLINGUAL_TABLET | Freq: Three times a day (TID) | SUBLINGUAL | Status: DC
  Administered 2023-08-31 – 2023-09-04 (×13): 1 via SUBLINGUAL
  Filled 2023-08-31 (×13): qty 1

## 2023-08-31 MED ORDER — GABAPENTIN 400 MG PO CAPS
800.0000 mg | ORAL_CAPSULE | Freq: Four times a day (QID) | ORAL | Status: DC
  Administered 2023-08-31 – 2023-09-02 (×9): 800 mg via ORAL
  Filled 2023-08-31 (×9): qty 2

## 2023-08-31 MED ORDER — METHYLPREDNISOLONE SODIUM SUCC 40 MG IJ SOLR
40.0000 mg | Freq: Once | INTRAMUSCULAR | Status: AC
  Administered 2023-08-31: 40 mg via INTRAVENOUS
  Filled 2023-08-31: qty 1

## 2023-08-31 MED ORDER — GABAPENTIN 300 MG PO CAPS
800.0000 mg | ORAL_CAPSULE | Freq: Once | ORAL | Status: AC
  Administered 2023-09-01: 800 mg via ORAL

## 2023-08-31 MED ORDER — IPRATROPIUM-ALBUTEROL 0.5-2.5 (3) MG/3ML IN SOLN
9.0000 mL | Freq: Four times a day (QID) | RESPIRATORY_TRACT | Status: DC
  Administered 2023-08-31: 3 mL via RESPIRATORY_TRACT
  Filled 2023-08-31: qty 3

## 2023-08-31 MED ORDER — METHYLPREDNISOLONE SODIUM SUCC 40 MG IJ SOLR
40.0000 mg | Freq: Two times a day (BID) | INTRAMUSCULAR | Status: DC
  Administered 2023-08-31 – 2023-09-03 (×6): 40 mg via INTRAVENOUS
  Filled 2023-08-31 (×6): qty 1

## 2023-08-31 MED ORDER — IPRATROPIUM-ALBUTEROL 0.5-2.5 (3) MG/3ML IN SOLN
3.0000 mL | Freq: Four times a day (QID) | RESPIRATORY_TRACT | Status: DC
  Administered 2023-08-31 – 2023-09-01 (×3): 3 mL via RESPIRATORY_TRACT
  Filled 2023-08-31 (×3): qty 3

## 2023-08-31 MED ORDER — BUPRENORPHINE HCL-NALOXONE HCL 8-2 MG SL SUBL: 1.0000 | SUBLINGUAL_TABLET | Freq: Three times a day (TID) | SUBLINGUAL | Status: DC

## 2023-08-31 NOTE — Assessment & Plan Note (Signed)
Stable, volume depleted on admission, given IV fluids for sepsis. --Monitor volume status

## 2023-08-31 NOTE — Plan of Care (Signed)
  Problem: Activity: Goal: Ability to tolerate increased activity will improve Outcome: Progressing   Problem: Respiratory: Goal: Ability to maintain adequate ventilation will improve Outcome: Progressing Goal: Ability to maintain a clear airway will improve Outcome: Progressing   Problem: Education: Goal: Ability to describe self-care measures that may prevent or decrease complications (Diabetes Survival Skills Education) will improve Outcome: Progressing Goal: Individualized Educational Video(s) Outcome: Progressing   Problem: Coping: Goal: Ability to adjust to condition or change in health will improve Outcome: Progressing   Problem: Fluid Volume: Goal: Ability to maintain a balanced intake and output will improve Outcome: Progressing

## 2023-08-31 NOTE — Progress Notes (Signed)
CROSS COVER NOTE  NAME: Christian Fields MRN: [id number] DOB : 05/08/1956    Concern as stated by nurse / staff    "Good Morning. This pt was admitted for sepsis yesterday. Currently pt is taking suboxone every 8 hours at home. Patient is very adamant and insisting that he takes it. On this admission, the admitting provider only prescribed it once a day. "    Pertinent findings on chart review: Patient admitted with sepsis from multifocal pneumonia. Med rec confirmed on above dosing at home for suboxone  Assessment and  Interventions   Assessment:    08/31/2023    1:00 AM 08/31/2023   12:08 AM 08/30/2023   11:00 PM  Vitals with BMI  Systolic 105 104 409  Diastolic 58 62 65  Pulse 97 102 90    Sats stable with 6L HFNC in place Plan: Suboxone 8-2 SL ordered TID as home prescribed  Sats stable with 6L HFNC in place      Donnie Mesa NP [facility] Cross Cover 7pm-7am - check amion for availability Pager [phone]

## 2023-08-31 NOTE — Progress Notes (Signed)
Progress Note   Patient: Christian Fields ZOX:[id number] DOB: January 07, 1956 DOA: 08/30/2023     1 DOS: the patient was seen and examined on 08/31/2023   Brief hospital course: HPI on admission 08/30/23:  "Christian Fields is a 67 y.o. male with medical history significant of COPD Gold stage IV, chronic hypoxic respiratory failure on 4 L continuously, HTN, HLD, IIDM, OSA not on CPAP, PVD on aspirin Plavix chronic pain syndrome on Suboxone non-small cell lung carcinoma status post treatment, COPD stage II, chronic HFpEF, diabetic neuropathy, presented with worsening of wheezing cough shortness of breath and fever.   Patient reported that he ran out of 1 of his nebulizing treatment on Wednesday and started to have wheezing and cough next day became worse yesterday, started to have subjective fever and chills and he tried to turn up oxygen to 6 L this morning to compensate but did right little help.  Also discussed the case with daughter over the phone and daughter reported the patient did run out of one of the breathing medications but she does know specifically which 1 recently and started to have trouble breathing since Wednesday when he was seen by the visiting nurse yesterday. ED Course: BiPAP 100% on 60% of oxygen, temperature 101 and borderline tachycardia borderline hypoxic.  WBC 40, with bandemia, hemoglobin 9.9, ABG 7.2 8/56/75, creatinine 1.2 bicarb 24.  Lactic acid 1.6   CT angiogram negative for PE but multifocal pneumonia predominantly on the right mid lobe and right lower lobe.  Patient was started on cefepime and doxycycline and IV bolus x 1."   Pt admitted to stepdown unit requiring acute BiPAP. Further hospital course and management as outlined below.   Assessment and Plan: * Sepsis (HCC) Evidenced by tachycardia, leukocytosis, new onset of fever, source of infection is multifocal pneumonia mainly on the right mid and lower lobe --s/p fluid resuscitation --continue IV  antibiotics --follow cultures --monitor fever curve, CBC, trend lactate  Acute on chronic respiratory failure with hypoxia (HCC) Due to multifocal pneumonia with underlying COPD chronically on 4 l/min O2 with Acute COPD Exacerbation --Supplement O2, wean as tolerated --Target spO2 > 88% --Continue IV Rocephin, Doxycycline (Itching w/ Zithromax given in ED) --Continue IV steroids --Continue scheduled Duonebs, PRN albuterol, ICS and LABA --Weaned off bipap - stable to transfer to floor --Pulmonary hygiene with IS, flutter --Follow blood and sputum cultures --Mucinex & other supportive care per orders  COPD with acute exacerbation (HCC) .  Chronic heart failure with preserved ejection fraction (HFpEF) (HCC) Stable, volume depleted on admission, given IV fluids for sepsis. --Monitor volume status  PVD (peripheral vascular disease) (HCC) On ASA, Plavix  Chronic pain syndrome Home regimen resumed - Suboxone TID, gabapentin 800 mg PO QID No indications for escalating pain medications - avoid doing so.  Multifocal pneumonia .  Insulin dependent type 2 diabetes mellitus (HCC) Family reported pt not taking [place] Sliding scale Novolog        Subjective: Received message from RN early this AM, pt "demanding to speak with you", he was angry his home pain meds were not being given like he takes at home.  RN reported him cursing at staff, not cooperating, not listening, refusing care.  The issue with pain med orders had been addressed overnight and meds were ordered as per home regimen.  During my encounter, pt continued to be irate about his medications, unable to redirect or get him to listen to explanation of timing of medications in the hospital.  He continued to raise his voice, curse and unwilling to listen.  I left the room due to patient being disrespectful and acting threatening.   He did say his breathing felt fine, back to normal.   Physical Exam: Vitals:   08/31/23 0900  08/31/23 1000 08/31/23 1100 08/31/23 1200  BP: 121/64 120/60 109/65 137/79  Pulse: (!) 119 (!) 103  72  Resp: (!) 21 18 13  (!) 22  Temp:      TempSrc:      SpO2: 95% 100%  (!) 88%  Weight:      Height:       CAVEAT -- unable to perform full exam due to patient's behaviors, felt unsafe to get close enough to auscultate heart and lungs  General exam: awake, alert, no acute distress, chronically ill appearing HEENT: moist mucus membranes, hearing grossly normal  Respiratory system: on 6 L/min HFNC O2, normal respiratory effort at rest, no accessory muscle use. Cardiovascular system: no gross peripheral edema.   Gastrointestinal system: abdomen appears non-distended Central nervous system: A&O x3. no gross focal neurologic deficits, normal speech Extremities: moves all , no edema, normal tone Skin: dry, intact, no rashes seen on visualized skin Psychiatry: angry mood, congruent affect, unwilling to listen or have conversation   Data Reviewed:  Notable labs ---  glucose 204, Ca 7.0 otherwise normal BMP WBC 41.2 >> 35.8, Hbg 7.8 likely dilution from sepsis fluids MRSA not detected  Family Communication: None present. Pt able to update.  Disposition: Status is: Inpatient Remains inpatient appropriate because: O2 requirements remain above baseline Likely d/c in 24-48 hours if weaned to baseline 4 L/min O2   Planned Discharge Destination: Home    Time spent: 45 minutes  Author: Pennie Banter, DO 08/31/2023 4:56 PM  For on call review [email or web address].

## 2023-08-31 NOTE — Assessment & Plan Note (Signed)
On ASA, Plavix

## 2023-08-31 NOTE — Assessment & Plan Note (Signed)
Home regimen resumed - Suboxone TID, gabapentin 800 mg PO QID No indications for escalating pain medications - avoid doing so.

## 2023-08-31 NOTE — Assessment & Plan Note (Signed)
Family reported pt not taking [place] Sliding scale Novolog

## 2023-08-31 NOTE — Assessment & Plan Note (Signed)
Due to multifocal pneumonia with underlying COPD chronically on 4 l/min O2 with Acute COPD Exacerbation 9/23 - improved, on baseline 4 L/min O2 --Consult Pulmonology - appreciate input --Supplement O2, wean as tolerated --Target spO2 > 88--92% --Continue IV Rocephin, Doxycycline (Itching w/ Zithromax given in ED) --Continue IV steroids --Change Duonebs & albuterol to Ipratropium and Xopenex (due to severe tachycardia this AM) --ICS and LABA --Weaned off bipap - stable to transfer to floor --Pulmonary hygiene with IS, flutter --Follow blood and sputum cultures --Mucinex & other supportive care per orders

## 2023-08-31 NOTE — Hospital Course (Signed)
HPI on admission 08/30/23:  "Christian Fields is a 67 y.o. male with medical history significant of COPD Gold stage IV, chronic hypoxic respiratory failure on 4 L continuously, HTN, HLD, IIDM, OSA not on CPAP, PVD on aspirin Plavix chronic pain syndrome on Suboxone non-small cell lung carcinoma status post treatment, COPD stage II, chronic HFpEF, diabetic neuropathy, presented with worsening of wheezing cough shortness of breath and fever.   Patient reported that he ran out of 1 of his nebulizing treatment on Wednesday and started to have wheezing and cough next day became worse yesterday, started to have subjective fever and chills and he tried to turn up oxygen to 6 L this morning to compensate but did right little help.  Also discussed the case with daughter over the phone and daughter reported the patient did run out of one of the breathing medications but she does know specifically which 1 recently and started to have trouble breathing since Wednesday when he was seen by the visiting nurse yesterday. ED Course: BiPAP 100% on 60% of oxygen, temperature 101 and borderline tachycardia borderline hypoxic.  WBC 40, with bandemia, hemoglobin 9.9, ABG 7.2 8/56/75, creatinine 1.2 bicarb 24.  Lactic acid 1.6   CT angiogram negative for PE but multifocal pneumonia predominantly on the right mid lobe and right lower lobe.  Patient was started on cefepime and doxycycline and IV bolus x 1."   Pt admitted to stepdown unit requiring acute BiPAP. Further hospital course and management as outlined below.

## 2023-08-31 NOTE — Assessment & Plan Note (Signed)
Evidenced by tachycardia, leukocytosis, new onset of fever, source of infection is multifocal pneumonia mainly on the right mid and lower lobe --s/p fluid resuscitation --continue IV antibiotics --follow cultures --monitor fever curve, CBC, trend lactate

## 2023-08-31 NOTE — Plan of Care (Signed)
  Problem: Respiratory: Goal: Ability to maintain adequate ventilation will improve Outcome: Progressing Goal: Ability to maintain a clear airway will improve Outcome: Progressing   Problem: Fluid Volume: Goal: Ability to maintain a balanced intake and output will improve Outcome: Progressing   Problem: Skin Integrity: Goal: Risk for impaired skin integrity will decrease Outcome: Progressing

## 2023-09-01 DIAGNOSIS — I5032 Chronic diastolic (congestive) heart failure: Secondary | ICD-10-CM | POA: Diagnosis not present

## 2023-09-01 DIAGNOSIS — J9621 Acute and chronic respiratory failure with hypoxia: Secondary | ICD-10-CM | POA: Diagnosis not present

## 2023-09-01 DIAGNOSIS — J189 Pneumonia, unspecified organism: Secondary | ICD-10-CM | POA: Diagnosis not present

## 2023-09-01 DIAGNOSIS — J441 Chronic obstructive pulmonary disease with (acute) exacerbation: Secondary | ICD-10-CM | POA: Diagnosis not present

## 2023-09-01 LAB — BASIC METABOLIC PANEL
Anion gap: 6 (ref 5–15)
BUN: 25 mg/dL — ABNORMAL HIGH (ref 8–23)
CO2: 22 mmol/L (ref 22–32)
Calcium: 7.7 mg/dL — ABNORMAL LOW (ref 8.9–10.3)
Chloride: 110 mmol/L (ref 98–111)
Creatinine, Ser: 0.98 mg/dL (ref 0.61–1.24)
GFR, Estimated: >60 mL/min (ref >60)
Glucose, Bld: 215 mg/dL — ABNORMAL HIGH (ref 70–99)
Potassium: 4.1 mmol/L (ref 3.5–5.1)
Sodium: 138 mmol/L (ref 135–145)

## 2023-09-01 LAB — BRAIN NATRIURETIC PEPTIDE: B Natriuretic Peptide: 898.1 pg/mL — ABNORMAL HIGH (ref 0.0–100.0)

## 2023-09-01 LAB — EXPECTORATED SPUTUM ASSESSMENT W GRAM STAIN, RFLX TO RESP C

## 2023-09-01 LAB — GLUCOSE, CAPILLARY
Glucose-Capillary: 177 mg/dL — ABNORMAL HIGH (ref 70–99)
Glucose-Capillary: 211 mg/dL — ABNORMAL HIGH (ref 70–99)
Glucose-Capillary: 105 mg/dL — ABNORMAL HIGH (ref 70–99)
Glucose-Capillary: 177 mg/dL — ABNORMAL HIGH (ref 70–99)

## 2023-09-01 LAB — CBC
HCT: 26.3 % — ABNORMAL LOW (ref 39.0–52.0)
Hemoglobin: 7.9 g/dL — ABNORMAL LOW (ref 13.0–17.0)
MCH: 30 pg (ref 26.0–34.0)
MCHC: 30 g/dL (ref 30.0–36.0)
MCV: 100 fL (ref 80.0–100.0)
Platelets: 261 10*3/uL (ref 150–400)
RBC: 2.63 MIL/uL — ABNORMAL LOW (ref 4.22–5.81)
RDW: 15.9 % — ABNORMAL HIGH (ref 11.5–15.5)
WBC: 28 10*3/uL — ABNORMAL HIGH (ref 4.0–10.5)
nRBC: 0 % (ref 0.0–0.2)

## 2023-09-01 LAB — PROCALCITONIN: Procalcitonin: 9.11 ng/mL

## 2023-09-01 LAB — TSH: TSH: 0.855 u[IU]/mL (ref 0.350–4.500)

## 2023-09-01 LAB — TROPONIN I (HIGH SENSITIVITY): Troponin I (High Sensitivity): 16 ng/L (ref <18)

## 2023-09-01 LAB — LACTIC ACID, PLASMA: Lactic Acid, Venous: 1.7 mmol/L (ref 0.5–1.9)

## 2023-09-01 MED ORDER — IPRATROPIUM BROMIDE 0.02 % IN SOLN
0.5000 mg | Freq: Four times a day (QID) | RESPIRATORY_TRACT | Status: DC
  Administered 2023-09-01 – 2023-09-04 (×13): 0.5 mg via RESPIRATORY_TRACT
  Filled 2023-09-01 (×13): qty 2.5

## 2023-09-01 MED ORDER — LEVALBUTEROL HCL 0.63 MG/3ML IN NEBU
0.6300 mg | INHALATION_SOLUTION | Freq: Four times a day (QID) | RESPIRATORY_TRACT | Status: DC
  Administered 2023-09-01 – 2023-09-04 (×13): 0.63 mg via RESPIRATORY_TRACT
  Filled 2023-09-01 (×13): qty 3

## 2023-09-01 MED ORDER — METOPROLOL TARTRATE 5 MG/5ML IV SOLN
5.0000 mg | INTRAVENOUS | Status: DC | PRN
  Administered 2023-09-01: 5 mg via INTRAVENOUS
  Filled 2023-09-01: qty 5

## 2023-09-01 MED ORDER — FUROSEMIDE 10 MG/ML IJ SOLN
20.0000 mg | Freq: Two times a day (BID) | INTRAMUSCULAR | Status: DC
  Administered 2023-09-01 – 2023-09-02 (×3): 20 mg via INTRAVENOUS
  Filled 2023-09-01 (×3): qty 2

## 2023-09-01 MED ORDER — MELATONIN 5 MG PO TABS
2.5000 mg | ORAL_TABLET | Freq: Every evening | ORAL | Status: DC | PRN
  Administered 2023-09-01 – 2023-09-03 (×3): 2.5 mg via ORAL
  Filled 2023-09-01 (×3): qty 1

## 2023-09-01 MED ORDER — IPRATROPIUM-ALBUTEROL 0.5-2.5 (3) MG/3ML IN SOLN: 3.0000 mL | Freq: Three times a day (TID) | RESPIRATORY_TRACT | Status: DC

## 2023-09-01 MED ORDER — LEVALBUTEROL HCL 0.63 MG/3ML IN NEBU: 0.6300 mg | INHALATION_SOLUTION | Freq: Once | RESPIRATORY_TRACT | Status: DC

## 2023-09-01 NOTE — Consult Note (Addendum)
WOC Nurse Consult Note: Reason for Consult: consult requested for left heel.  Performed remotely after review of progress notes from podiatry. Pt is familiar to [facility] team from a previous consult.  Left heel chronic full thickness wound is followed as an outpatient by the podiatry service.  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 There is no active drainage today. Culture has been negative. Previous x-rays were negative. They will continue with packing and bandaging. Continue with nonweightbearing. Follow-up with me in 3 to 4 weeks." Dressing procedure/placement/frequency: Continue present plan of care as previously ordered by the podiatry service.  Topical treatment ordered provided for bedside nurses to perform as follows: Apply moist gauze packing to left heel wound Q day, then cover with 4X4s and kerlex Pt should resume followup with the podiatry team after discharge.  Please re-consult if further assistance is needed.  Thank-you,  Cammie Mcgee MSN, RN, CWOCN, [place], CNS [phone]

## 2023-09-01 NOTE — Progress Notes (Signed)
Attempted to round on patient. Briefly introduced patient to role of nurse navigator. Will come back to complete intake assessment, as he was up to bathroom.

## 2023-09-01 NOTE — Consult Note (Signed)
PULMONOLOGY         Date: 09/01/2023,   MRN# [id number] Christian Fields Sep 18, 1956     AdmissionWeight: 79.1 kg                 CurrentWeight: 74.4 kg  Referring provider: Dr Denton Lank   CHIEF COMPLAINT:   Acute on chronic hypoxemic respiratory failure   HISTORY OF PRESENT ILLNESS   Christian Fields is a 67 y.o. male with medical history significant of advanced COPD , chronic hypoxic respiratory failure on 4 L continuously, HTN, HLD, IIDM, OSA not on CPAP, PVD on aspirin Plavix chronic pain syndrome on Suboxone non-small cell lung carcinoma status post treatment, chronic HFpEF, diabetic neuropathy, presented with worsening of wheezing cough shortness of breath and fever. He reports worsening dyspnea and wheezing once he ran out of his meds. He required BIPAP at 60% with fever and CTPE done showing multifocal pneumonia worse on right lung. PCCM consultation for further evaluation.     PAST MEDICAL HISTORY   Past Medical History:  Diagnosis Date   Anemia    Anxiety    Arthritis    Asthma    Cancer (HCC)    Basal Cell Skin Cancer   Chronic back pain    COPD (chronic obstructive pulmonary disease) (HCC)    Depression    Diabetes mellitus (HCC)    Dyspnea    GERD (gastroesophageal reflux disease)    Gout    Gout    Headache    History of blood clots    Left Leg--July 2018   History of kidney stones    Hyperlipidemia    Hyperlipidemia    Hypertension    Kidney stones    Neuropathy    On home oxygen therapy    2 L / M   Pneumonia 06/2017   Sleep apnea    Ulcer of foot (HCC)    Right     SURGICAL HISTORY   Past Surgical History:  Procedure Laterality Date   APPENDECTOMY     DG FEET 2 VIEWS BILAT     IRRIGATION AND DEBRIDEMENT FOOT Left 07/11/2023   Procedure: IRRIGATION AND DEBRIDEMENT FOOT;  Surgeon: Gwyneth Revels, DPM;  Location: [facility];  Service: Orthopedics/Podiatry;  Laterality: Left;   LIPOMA EXCISION Right 08/15/2017   Procedure: EXCISION  TUMOR(CYST) FOOT;  Surgeon: Recardo Evangelist, DPM;  Location: [facility];  Service: Podiatry;  Laterality: Right;   LOWER EXTREMITY ANGIOGRAPHY Left 07/03/2023   Procedure: Lower Extremity Angiography;  Surgeon: Annice Needy, MD;  Location: [facility];  Service: Cardiovascular;  Laterality: Left;   OTHER SURGICAL HISTORY Bilateral Foot surgery     FAMILY HISTORY   Family History  Problem Relation Age of Onset   Hypertension Mother      SOCIAL HISTORY   Social History   Tobacco Use   Smoking status: Former    Current packs/day: 0.50    Average packs/day: 0.5 packs/day for 6.7 years (3.4 ttl pk-yrs)    Types: Cigarettes    Start date: 2018   Smokeless tobacco: Never  Vaping Use   Vaping status: Never Used  Substance Use Topics   Alcohol use: Yes    Alcohol/week: 1.0 standard drink of alcohol    Types: 1 Cans of beer per week    Comment: occ   Drug use: No     MEDICATIONS    Home Medication:    Current Medication:  Current Facility-Administered Medications:  acetaminophen (TYLENOL) tablet 650 mg, 650 mg, Oral, Q6H PRN, Mikey College T, MD, 650 mg at 08/31/23 0335   albuterol (PROVENTIL) (2.5 MG/3ML) 0.083% nebulizer solution 2.5 mg, 2.5 mg, Nebulization, Q4H PRN, Mikey College T, MD, 2.5 mg at 09/01/23 0520   allopurinol (ZYLOPRIM) tablet 600 mg, 600 mg, Oral, Daily, Mikey College T, MD, 600 mg at 09/01/23 0850   aspirin EC tablet 81 mg, 81 mg, Oral, Daily, Mikey College T, MD, 81 mg at 09/01/23 0851   azelastine (ASTELIN) 0.1 % nasal spray 2 spray, 2 spray, Each Nare, BID PRN, Emeline General, MD   bisacodyl (DULCOLAX) EC tablet 5 mg, 5 mg, Oral, Daily PRN, Mikey College T, MD   buprenorphine-naloxone (SUBOXONE) 8-2 mg per SL tablet 1 tablet, 1 tablet, Sublingual, Q8H, Esaw Grandchild A, DO, 1 tablet at 09/01/23 0501   cefTRIAXone (ROCEPHIN) 2 g in sodium chloride 0.9 % 100 mL IVPB, 2 g, Intravenous, Q24H, Emeline General, MD, Stopped at 08/31/23 2336   clopidogrel  (PLAVIX) tablet 75 mg, 75 mg, Oral, Daily, Mikey College T, MD, 75 mg at 09/01/23 0850   doxycycline (VIBRAMYCIN) 100 mg in sodium chloride 0.9 % 250 mL IVPB, 100 mg, Intravenous, Q12H, Otelia Sergeant, RPH, Last Rate: 125 mL/hr at 09/01/23 1010, 100 mg at 09/01/23 1010   DULoxetine (CYMBALTA) DR capsule 30 mg, 30 mg, Oral, Daily, Mikey College T, MD, 30 mg at 09/01/23 0850   enoxaparin (LOVENOX) injection 40 mg, 40 mg, Subcutaneous, Q24H, Mikey College T, MD, 40 mg at 08/31/23 2113   ezetimibe (ZETIA) tablet 10 mg, 10 mg, Oral, Daily, Mikey College T, MD, 10 mg at 09/01/23 0850   fluticasone (FLONASE) 50 MCG/ACT nasal spray 1 spray, 1 spray, Each Nare, Daily PRN, Mikey College T, MD   fluticasone furoate-vilanterol (BREO ELLIPTA) 100-25 MCG/ACT 1 puff, 1 puff, Inhalation, Daily, 1 puff at 09/01/23 0851 **AND** umeclidinium bromide (INCRUSE ELLIPTA) 62.5 MCG/ACT 1 puff, 1 puff, Inhalation, Daily, Mikey College T, MD, 1 puff at 09/01/23 0851   furosemide (LASIX) injection 20 mg, 20 mg, Intravenous, Q12H, Esaw Grandchild A, DO, 20 mg at 09/01/23 1010   gabapentin (NEURONTIN) capsule 800 mg, 800 mg, Oral, QID, Barrie Folk, RPH, 800 mg at 09/01/23 0850   guaiFENesin (MUCINEX) 12 hr tablet 600 mg, 600 mg, Oral, BID, Mikey College T, MD, 600 mg at 09/01/23 0850   insulin aspart (novoLOG) injection 0-15 Units, 0-15 Units, Subcutaneous, TID WC, Emeline General, MD, 3 Units at 09/01/23 0849   insulin aspart (novoLOG) injection 0-5 Units, 0-5 Units, Subcutaneous, QHS, Mikey College T, MD, 2 Units at 08/30/23 2232   ipratropium (ATROVENT) nebulizer solution 0.5 mg, 0.5 mg, Nebulization, Q6H, Esaw Grandchild A, DO   levalbuterol (XOPENEX) nebulizer solution 0.63 mg, 0.63 mg, Nebulization, Once, Esaw Grandchild A, DO   levalbuterol (XOPENEX) nebulizer solution 0.63 mg, 0.63 mg, Nebulization, Q6H, Esaw Grandchild A, DO   melatonin tablet 2.5 mg, 2.5 mg, Oral, QHS PRN, Mansy, Jan A, MD, 2.5 mg at 09/01/23 0030    methylPREDNISolone sodium succinate (SOLU-MEDROL) 40 mg/mL injection 40 mg, 40 mg, Intravenous, Q12H, Belue, Lendon Collar, RPH, 40 mg at 09/01/23 0550   metoprolol tartrate (LOPRESSOR) injection 5 mg, 5 mg, Intravenous, Q4H PRN, Esaw Grandchild A, DO, 5 mg at 09/01/23 1103   ondansetron (ZOFRAN) tablet 4 mg, 4 mg, Oral, Q6H PRN **OR** ondansetron (ZOFRAN) injection 4 mg, 4 mg, Intravenous, Q6H PRN, Emeline General, MD   pantoprazole (PROTONIX) EC  tablet 40 mg, 40 mg, Oral, Daily, Mikey College T, MD, 40 mg at 09/01/23 1914   roflumilast (DALIRESP) tablet 500 mcg, 500 mcg, Oral, Daily, Mikey College T, MD, 500 mcg at 09/01/23 0850   simvastatin (ZOCOR) tablet 40 mg, 40 mg, Oral, Daily, Mikey College T, MD, 40 mg at 09/01/23 0850    ALLERGIES   Bee venom, Linezolid, and Azithromycin     REVIEW OF SYSTEMS    Review of Systems:  Gen:  Denies  fever, sweats, chills weigh loss  HEENT: Denies blurred vision, double vision, ear pain, eye pain, hearing loss, nose bleeds, sore throat Cardiac:  No dizziness, chest pain or heaviness, chest tightness,edema Resp:   reports dyspnea chronically  Gi: Denies swallowing difficulty, stomach pain, nausea or vomiting, diarrhea, constipation, bowel incontinence Gu:  Denies bladder incontinence, burning urine Ext:   Denies Joint pain, stiffness or swelling Skin: Denies  skin rash, easy bruising or bleeding or hives Endoc:  Denies polyuria, polydipsia , polyphagia or weight change Psych:   Denies depression, insomnia or hallucinations   Other:  All other systems negative   VS: BP (!) 175/93   Pulse (!) 162   Temp 97.9 F (36.6 C)   Resp 18   Ht 5\' 5"  (1.651 m)   Wt 74.4 kg   SpO2 95%   BMI 27.29 kg/m      PHYSICAL EXAM    GENERAL:NAD, no fevers, chills, no weakness no fatigue HEAD: Normocephalic, atraumatic.  EYES: Pupils equal, round, reactive to light. Extraocular muscles intact. No scleral icterus.  MOUTH: Moist mucosal membrane. Dentition  intact. No abscess noted.  EAR, NOSE, THROAT: Clear without exudates. No external lesions.  NECK: Supple. No thyromegaly. No nodules. No JVD.  PULMONARY: decreased breath sounds with mild rhonchi worse at bases bilaterally.  CARDIOVASCULAR: S1 and S2. Regular rate and rhythm. No murmurs, rubs, or gallops. No edema. Pedal pulses 2+ bilaterally.  GASTROINTESTINAL: Soft, nontender, nondistended. No masses. Positive bowel sounds. No hepatosplenomegaly.  MUSCULOSKELETAL: No swelling, clubbing, or edema. Range of motion full in all extremities.  NEUROLOGIC: Cranial nerves II through XII are intact. No gross focal neurological deficits. Sensation intact. Reflexes intact.  SKIN: No ulceration, lesions, rashes, or cyanosis. Skin warm and dry. Turgor intact.  PSYCHIATRIC: Mood, affect within normal limits. The patient is awake, alert and oriented x 3. Insight, judgment intact.       IMAGING    Narrative & Impression  CLINICAL DATA:  Difficulty breathing since last night with hypoxia. Possible pulmonary embolism. History of lung cancer.   EXAM: CT ANGIOGRAPHY CHEST WITH CONTRAST   TECHNIQUE: Multidetector CT imaging of the chest was performed using the standard protocol during bolus administration of intravenous contrast. Multiplanar CT image reconstructions and MIPs were obtained to evaluate the vascular anatomy.   RADIATION DOSE REDUCTION: This exam was performed according to the departmental dose-optimization program which includes automated exposure control, adjustment of the mA and/or kV according to patient size and/or use of iterative reconstruction technique.   CONTRAST:  75mL OMNIPAQUE IOHEXOL 350 MG/ML SOLN   COMPARISON:  08/26/2023, 01/24/2023   FINDINGS: Cardiovascular: Heart is normal size. Calcified plaque over the left main, left anterior descending and lateral circumflex coronary arteries. Mild calcification of the aortic root. Thoracic aorta is normal in caliber.  There is mild calcified plaque over the descending thoracic aorta. Pulmonary arterial system is well opacified without evidence of emboli. Remaining vascular structures are unremarkable.   Mediastinum/Nodes: Evidence mediastinal and bilateral hilar  adenopathy. Precarinal lymph node measuring 1.5 cm and subcarinal lymph node measuring 0.7 cm. A couple right hilar lymph nodes measure 1.5 cm. These findings have progressed slightly compared to the prior exam. Remaining mediastinal structures are unremarkable.   Lungs/Pleura: The lungs are adequately inflated demonstrate moderate centrilobular emphysematous disease. Stable sub pleural irregular masslike opacity measuring 1.6 x 1.9 cm thought to represent post treatment change of the region patient's original left lung cancer. Interval development of patchy peripheral nodular airspace process predominately within the right lung and only minimally in the left lung suggesting acute infection. No significant effusion. Airways are unremarkable.   Upper Abdomen: Calcified plaque of the abdominal aorta. No acute findings. Several bilateral renal cysts unchanged with the larger measuring 2.8 cm of the left midpole. No follow-up imaging is recommended.   Musculoskeletal: Stable mild compression fracture over the upper thoracic spine. Old sternal fracture.   Review of the MIP images confirms the above findings.   IMPRESSION: 1. No evidence of pulmonary embolism. 2. Interval development of patchy peripheral nodular airspace process predominately within the right lung and only minimally in the left lung suggesting acute infection. 3. Stable sub pleural irregular masslike opacity measuring 1.6 x 1.9 cm thought to represent post treatment change in the region patient's original left lung cancer. 4. Mediastinal and bilateral hilar adenopathy slightly progressed compared to the prior exam and may be reactive to patient's suspected pulmonary  infection as described above. 5. Aortic atherosclerosis. Atherosclerotic coronary artery disease. 6. Stable bilateral renal cysts.   Aortic Atherosclerosis (ICD10-I70.0) and Emphysema (ICD10-J43.9).     Electronically Signed   By: Elberta Fortis M.D.   On: 08/30/2023 13:04   ASSESSMENT/PLAN   Acute on chronic hypoxemic respiratory failure    - Acute hypoxemic respiratory failure - present on admission  - COVID19 NEGATIVE  - supplemental O2 during my evaluation -4l/MIN -Respiratory viral panel-ORDERED -legionella ab-ORDERED -strep pneumoniae ur AG-ORDERED -sputum resp cultures- ORDERED -reviewed pertinent imaging with patient today - ESR -PT/OT for d/c planning  -please encourage patient to use incentive spirometer few times each hour while hospitalized.      Advanced COPD with chronic hypoxemia   - continue supplemental O2 with goal SpO2 >88%-92%   - agree with solumedrol - please wean as able   - agree with Daliresp 500 per home dose   - agre with xopenex and atrovent q6h  -agree with breo and incruse ellipta      Left lung cancer     - chronic stable with repeat Imaging not worrisome for recurrence     Thank you for allowing me to participate in the care of this patient.   Patient/Family are satisfied with care plan and all questions have been answered.    Provider disclosure: Patient with at least one acute or chronic illness or injury that poses a threat to life or bodily function and is being managed actively during this encounter.  All of the below services have been performed independently by signing provider:  review of prior documentation from internal and or external health records.  Review of previous and current lab results.  Interview and comprehensive assessment during patient visit today. Review of current and previous chest radiographs/CT scans. Discussion of management and test interpretation with health care team and patient/family.   This  document was prepared using Dragon voice recognition software and may include unintentional dictation errors.     Vida Rigger, M.D.  Division of Pulmonary & Critical Care Medicine

## 2023-09-01 NOTE — Progress Notes (Addendum)
Progress Note   Patient: Christian Fields AOZ:[id number] DOB: 1956/10/25 DOA: 08/30/2023     2 DOS: the patient was seen and examined on 09/01/2023   Brief hospital course: HPI on admission 08/30/23:  "JAQWON GODMAN is a 67 y.o. male with medical history significant of COPD Gold stage IV, chronic hypoxic respiratory failure on 4 L continuously, HTN, HLD, IIDM, OSA not on CPAP, PVD on aspirin Plavix chronic pain syndrome on Suboxone non-small cell lung carcinoma status post treatment, COPD stage II, chronic HFpEF, diabetic neuropathy, presented with worsening of wheezing cough shortness of breath and fever.   Patient reported that he ran out of 1 of his nebulizing treatment on Wednesday and started to have wheezing and cough next day became worse yesterday, started to have subjective fever and chills and he tried to turn up oxygen to 6 L this morning to compensate but did right little help.  Also discussed the case with daughter over the phone and daughter reported the patient did run out of one of the breathing medications but she does know specifically which 1 recently and started to have trouble breathing since Wednesday when he was seen by the visiting nurse yesterday. ED Course: BiPAP 100% on 60% of oxygen, temperature 101 and borderline tachycardia borderline hypoxic.  WBC 40, with bandemia, hemoglobin 9.9, ABG 7.2 8/56/75, creatinine 1.2 bicarb 24.  Lactic acid 1.6   CT angiogram negative for PE but multifocal pneumonia predominantly on the right mid lobe and right lower lobe.  Patient was started on cefepime and doxycycline and IV bolus x 1."   Pt admitted to stepdown unit requiring acute BiPAP. Further hospital course and management as outlined below.   Assessment and Plan: * Sepsis (HCC) Evidenced by tachycardia, leukocytosis, new onset of fever, source of infection is multifocal pneumonia mainly on the right mid and lower lobe --s/p fluid resuscitation --continue IV  antibiotics --follow cultures --monitor fever curve, CBC, trend lactate  Acute on chronic respiratory failure with hypoxia (HCC) Due to multifocal pneumonia with underlying COPD chronically on 4 l/min O2 with Acute COPD Exacerbation 9/23 - improved, on baseline 4 L/min O2 --Consult Pulmonology - appreciate input --Supplement O2, wean as tolerated --Target spO2 > 88--92% --Continue IV Rocephin, Doxycycline (Itching w/ Zithromax given in ED) --Continue IV steroids --Change Duonebs & albuterol to Ipratropium and Xopenex (due to severe tachycardia this AM) --ICS and LABA --Weaned off bipap - stable to transfer to floor --Pulmonary hygiene with IS, flutter --Follow blood and sputum cultures --Mucinex & other supportive care per orders  COPD with acute exacerbation (HCC) .  Chronic heart failure with preserved ejection fraction (HFpEF) (HCC) Acute on chronic HFpEF - unclear if present on admission or due to sepsis IV fluids.  On admission, pt appeared volume depleted on admission, given IV fluids for sepsis.  Now more tachycardic, normal lactic acid, improved O2 needs, but elevated BNP and chest tightness today --Will diurese today with IV Lasix 20 mg Q12 H and follow response --Monitor volume status  PVD (peripheral vascular disease) (HCC) On ASA, Plavix  Chronic pain syndrome Home regimen resumed - Suboxone TID, gabapentin 800 mg PO QID No indications for escalating pain medications - avoid doing so.  Multifocal pneumonia .  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 --WOC consulted for wound care - see orders --Follow up with Podiatry --no longer on antibiotics for osteo prior to admission, now abx for PNA --  Monitor closely  Insulin dependent type 2 diabetes mellitus (HCC) Family reported pt not taking Farxiga Sliding scale Novolog        Subjective: Pt tachycardic this AM.  When seen on  rounds on 1C, pt had been given metoprolol and HR improved to 110's.  BP stable but elevated.  Pt says he feels shaky, earlier felt some chest tightness, no feeling better.  Otherwise states his breathing feels fine. Denies complaints.     Physical Exam: Vitals:   09/01/23 0811 09/01/23 1054 09/01/23 1352 09/01/23 1522  BP: 124/70 (!) 175/93  (!) 147/81  Pulse: (!) 111 (!) 162 (!) 106 (!) 110  Resp:   18 20  Temp: 97.9 F (36.6 C)   98 F (36.7 C)  TempSrc:    Oral  SpO2: 95%  96% 92%  Weight:      Height:         General exam: awake, alert, no acute distress, chronically ill appearing HEENT: moist mucus membranes, hearing grossly normal, nasal cannula in place  Respiratory system: on 4 L/min HFNC O2 (down from 6 L), normal respiratory effort at rest, no accessory muscle use, no active wheezing, overall generally diminished breath sounds. Cardiovascular system: normal S1/S2, tachycardic, regular rhythm, no murmur heard, no peripheral edema, +JVP.   Gastrointestinal system: abdomen non-tender, non-distended Central nervous system: A&O x3. no gross focal neurologic deficits, normal speech Extremities: tremulous, no edema, normal tone Skin: dry, intact, no rashes seen on visualized skin Psychiatry: normal mood, congruent affect, judgment and insight appear intact   Data Reviewed:  Notable labs ---  glucose 215, Ca 7.7 otherwise normal BMP WBC 41.2 >> 35.8 >> 28.0 Hbg 7.8 >> 7.9 likely dilution from sepsis fluids  BNP elevated 898.1 Troponin-hs 16 Procal 9.11 Lactic acid normal 1.7  Micro: MRSA not detected C diff -- pending Sputum culture --- pending Respiratory viral panel --- pending  Family Communication: None present. Pt able to update.  Disposition: Status is: Inpatient Remains inpatient appropriate because: O2 requirements remain above baseline, tachycardic, ongoing evaluation   Planned Discharge Destination: Home    Time spent: 65 minutes including time  spent at bedside and in coordination of care with staff and consultants  Author: Pennie Banter, DO 09/01/2023 5:04 PM  For on call review [email or web address].

## 2023-09-01 NOTE — Plan of Care (Signed)

## 2023-09-01 NOTE — Progress Notes (Signed)
Pt declines nasopharyngeal swab for lab testing at this time x2. Education given about necessity/antibiotics.

## 2023-09-01 NOTE — Progress Notes (Signed)
Transferring to unit 2A. Report given to nurse Prawenna.

## 2023-09-01 NOTE — Assessment & Plan Note (Signed)
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 Seen by Dr. Ether Griffins 9/17 - note reviewed --Non-weight-bearing on LLE --Follow up with Dr. Ether Griffins as scheduled --WOC consulted for wound care - see orders --no longer on antibiotics for osteo prior to admission, now abx for PNA --Monitor closely

## 2023-09-01 NOTE — Progress Notes (Signed)
   09/01/23 0811  Assess: MEWS Score  Temp 97.9 F (36.6 C)  BP 124/70  MAP (mmHg) 86  Pulse Rate (!) 111  Level of Consciousness Alert  SpO2 95 %  O2 Device Nasal Cannula  Patient Activity (if Appropriate) In bed  O2 Flow Rate (L/min) 4 L/min  Assess: MEWS Score  MEWS Temp 0  MEWS Systolic 0  MEWS Pulse 2  MEWS RR 0  MEWS LOC 0  MEWS Score 2  MEWS Score Color Yellow  Assess: if the MEWS score is Yellow or Red  Were vital signs accurate and taken at a resting state? Yes  Does the patient meet 2 or more of the SIRS criteria? Yes  Does the patient have a confirmed or suspected source of infection? Yes  MEWS guidelines implemented  Yes, yellow  Treat  MEWS Interventions Considered administering scheduled or prn medications/treatments as ordered  Take Vital Signs  Increase Vital Sign Frequency  Yellow: Q2hr x1, continue Q4hrs until patient remains green for 12hrs  Escalate  MEWS: Escalate Yellow: Discuss with charge nurse and consider notifying provider and/or RRT  Notify: Charge Nurse/RN  Name of Charge Nurse/RN Notified Curtis Sites, RN  Provider Notification  Provider Name/Title Dr. Denton Lank  Date Provider Notified 09/01/23  Time Provider Notified 0827  Method of Notification  (secure chat)  Notification Reason Change in status  Provider response See new orders  Date of Provider Response 09/01/23  Time of Provider Response 0830  Assess: SIRS CRITERIA  SIRS Temperature  0  SIRS Pulse 1  SIRS Respirations  0  SIRS WBC 0  SIRS Score Sum  1

## 2023-09-02 ENCOUNTER — Inpatient Hospital Stay: Payer: 59 | Admitting: Oncology

## 2023-09-02 ENCOUNTER — Ambulatory Visit: Payer: 59 | Admitting: Radiation Oncology

## 2023-09-02 DIAGNOSIS — J441 Chronic obstructive pulmonary disease with (acute) exacerbation: Secondary | ICD-10-CM | POA: Diagnosis not present

## 2023-09-02 DIAGNOSIS — G894 Chronic pain syndrome: Secondary | ICD-10-CM | POA: Diagnosis not present

## 2023-09-02 DIAGNOSIS — J9621 Acute and chronic respiratory failure with hypoxia: Secondary | ICD-10-CM | POA: Diagnosis not present

## 2023-09-02 DIAGNOSIS — J189 Pneumonia, unspecified organism: Secondary | ICD-10-CM | POA: Diagnosis not present

## 2023-09-02 LAB — CBC
HCT: 27.9 % — ABNORMAL LOW (ref 39.0–52.0)
Hemoglobin: 8.4 g/dL — ABNORMAL LOW (ref 13.0–17.0)
MCH: 29.1 pg (ref 26.0–34.0)
MCHC: 30.1 g/dL (ref 30.0–36.0)
MCV: 96.5 fL (ref 80.0–100.0)
Platelets: 301 10*3/uL (ref 150–400)
RBC: 2.89 MIL/uL — ABNORMAL LOW (ref 4.22–5.81)
RDW: 15.9 % — ABNORMAL HIGH (ref 11.5–15.5)
WBC: 13.6 10*3/uL — ABNORMAL HIGH (ref 4.0–10.5)
nRBC: 0.2 % (ref 0.0–0.2)

## 2023-09-02 LAB — C-REACTIVE PROTEIN: CRP: 6.4 mg/dL — ABNORMAL HIGH (ref <1.0)

## 2023-09-02 LAB — GLUCOSE, CAPILLARY
Glucose-Capillary: 152 mg/dL — ABNORMAL HIGH (ref 70–99)
Glucose-Capillary: 158 mg/dL — ABNORMAL HIGH (ref 70–99)
Glucose-Capillary: 133 mg/dL — ABNORMAL HIGH (ref 70–99)
Glucose-Capillary: 214 mg/dL — ABNORMAL HIGH (ref 70–99)

## 2023-09-02 LAB — PROCALCITONIN: Procalcitonin: 6.01 ng/mL

## 2023-09-02 MED ORDER — GABAPENTIN 400 MG PO CAPS
800.0000 mg | ORAL_CAPSULE | Freq: Four times a day (QID) | ORAL | Status: DC
  Administered 2023-09-03 – 2023-09-04 (×6): 800 mg via ORAL
  Filled 2023-09-02 (×6): qty 2

## 2023-09-02 MED ORDER — SODIUM CHLORIDE 0.9 % IV SOLN: INTRAVENOUS | Status: DC | PRN

## 2023-09-02 NOTE — Evaluation (Signed)
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    NWBing LLE   Clinical Impression   Chart reviewed, nurse cleared pt for participation in OT evaluation. Pt is alert and oriented x4, agreeable to participation with encouragement. PTA pt reports using his knee scooter around his house due to LLE NWBing status from foot surgery in 8/24, per care everywhere note on 9/17 from podiatry pt is to continue NWBing. Pt reports he puts weight on the LLE "sometimes". PTA pt reports MOD I with dressing, bathing (including getting into a tub with LLE draped), grooming, eating tasks; assist from son for IADLs such as cooking/cleaning. Pt presents with deficits in activity tolerance on this date with HR up to 160s with minimal movement (STS with supervision). Spo2 93-95% on 4 L via [place] throughout.  Pt is impulsive during mobility requiring frequent vcs for pacing and NWBing. Bed mobility completed with MOD I, toileting with SET UP. Pt will continue to benefit from acute OT to address deficits and to facilitate return to PLOF.      If plan is discharge home, recommend the following: A lot of help with walking and/or transfers;A little help with bathing/dressing/bathroom;Assistance with cooking/housework;Assist for transportation;Help with stairs or ramp for entrance    Functional Status Assessment  Patient has had a recent decline in their functional status and  demonstrates the ability to make significant improvements in function in a reasonable and predictable amount of time.  Equipment Recommendations  BSC/3in1 (?wheelchair)    Recommendations for Other Services       Precautions / Restrictions Precautions Precautions: Fall Restrictions Weight Bearing Restrictions: Yes LLE Weight Bearing: Non weight bearing (per podiatry note in care everywhere on 08/26/23)      Mobility Bed Mobility Overal bed mobility: Modified Independent                  Transfers Overall transfer level: Needs assistance   Transfers: Sit to/from Stand Sit to Stand: Supervision (pt impulsive, putting some weight onto LLE, stating 'It is ok if I put a little on there" pt educated on NWBing recommendations per chart)                  Balance Overall balance assessment: Needs assistance Sitting-balance support: Feet supported Sitting balance-Leahy Scale: Normal     Standing balance support: No upper extremity supported Standing balance-Leahy Scale: Good Standing balance comment: pt attempts to stand and take lateral steps despite NWBing orders                           ADL either performed or assessed with clinical judgement   ADL Overall ADL's : Needs assistance/impaired Eating/Feeding: Set up;Sitting   Grooming: Sitting;Set up               Lower Body Dressing: Maximal assistance Lower Body Dressing Details (indicate cue type and reason): sock     Toileting-  Clothing Manipulation and Hygiene: Set up Toileting - Clothing Manipulation Details (indicate cue type and reason): urinal in bed       General ADL Comments: further participation limited by increased HR with minimal activity     Vision Patient Visual Report: No change from baseline       Perception         Praxis         Pertinent Vitals/Pain Pain Assessment Pain Assessment: No/denies pain     Extremity/Trunk Assessment Upper Extremity  Assessment Upper Extremity Assessment: Overall WFL for tasks assessed   Lower Extremity Assessment Lower Extremity Assessment: Overall WFL for tasks assessed;LLE deficits/detail LLE Deficits / Details: L foot covered with dressing where reported wound is   Cervical / Trunk Assessment Cervical / Trunk Assessment: Normal   Communication Communication Communication: No apparent difficulties Cueing Techniques: Verbal cues;Tactile cues   Cognition Arousal: Alert Behavior During Therapy: Impulsive, WFL for tasks assessed/performed Overall Cognitive Status: No family/caregiver present to determine baseline cognitive functioning Area of Impairment: Safety/judgement                         Safety/Judgement: Decreased awareness of deficits           General Comments       Exercises Other Exercises Other Exercises: edu re: role of OT, role of rehab, discharge recommendations, home safety, safe DME use   Shoulder Instructions      Home Living Family/patient expects to be discharged to:: Private residence Living Arrangements: Children (Son) Available Help at Discharge: Family;Available PRN/intermittently Type of Home: Mobile home Home Access: Stairs to enter Entrance Stairs-Number of Steps: 7-8 to get to porch Entrance Stairs-Rails: Can reach both Home Layout: One level     Bathroom Shower/Tub: Tub/shower unit (sits in tub with LLE over tub)   Bathroom Toilet: Standard Bathroom Accessibility: No   Home Equipment: Rollator (4 wheels);Cane - single Information systems manager (2 wheels) (knee roller and was previously assessed for WC)   Additional Comments: home o2 use, knee scooter      Prior Functioning/Environment               Mobility Comments: pt reports using knee scooter with MOD I around house; needs assist getting up 7-8 steps, reports his children "carry him" ADLs Comments: reports MOD I with ADL, assist as needed (cooking, cleaning, driving)  from son        OT Problem List: Decreased activity tolerance;Decreased knowledge of use of DME or AE;Cardiopulmonary status limiting activity      OT Treatment/Interventions: Self-care/ADL training;Therapeutic exercise;Therapeutic activities;Energy conservation;DME and/or AE instruction;Patient/family education    OT Goals(Current goals can be found in the care plan section) Acute Rehab OT Goals Patient Stated Goal: go home, continue therapy OT Goal Formulation: With patient Time For Goal Achievement: 09/16/23 Potential to Achieve Goals: Good ADL Goals Pt Will Perform Grooming: with modified independence;sitting Pt Will Perform Lower Body Dressing: with modified independence;sitting/lateral leans Pt Will Transfer to Toilet: with modified independence Pt Will Perform Toileting - Clothing Manipulation and hygiene: with modified independence;sitting/lateral leans  OT Frequency: Min 1X/week    Co-evaluation              AM-PAC OT "6 Clicks" Daily Activity     Outcome Measure Help from another person eating meals?: None Help from another person taking care of personal grooming?: None Help from another person toileting, which includes using toliet, bedpan, or urinal?: A Little Help  from another person bathing (including washing, rinsing, drying)?: A Little Help from another person to put on and taking off regular upper body clothing?: None Help from another person to put on and taking off regular lower body clothing?: A Little 6 Click Score: 21   End of Session Equipment Utilized During Treatment: Oxygen Nurse Communication: Mobility status;Other (comment) (vitals)  Activity Tolerance: Treatment limited secondary to medical complications (Comment);Other (comment) (increased HR) Patient left: in bed;with call bell/phone within reach;with bed alarm set  OT Visit Diagnosis: Other abnormalities of gait and mobility (R26.89)                Time: 1142-1200 OT Time Calculation  (min): 18 min Charges:  OT General Charges $OT Visit: 1 Visit OT Evaluation $OT Eval Moderate Complexity: 1 Mod  Oleta Mouse, OTD OTR/L  09/02/23, 1:10 PM

## 2023-09-02 NOTE — Progress Notes (Signed)
PT Cancellation Note  Patient Details Name: Christian Fields MRN: [id number] DOB: 1956-06-25   Cancelled Treatment:    Reason Eval/Treat Not Completed: Other (comment): Orders Received. Chart Reviewed. Patient supine in bed upon arrival with nurse at bedside. Patient refused PT evaluation this date/time. Patient reports fearful of elevated HR and fatigue. PT providing extensive education and encouragement, however patient unwilling to participate this date. Will try again at later date/time as appropriate.   Creed Copper Fairly, PT, DPT 09/02/23 3:09 PM

## 2023-09-02 NOTE — Plan of Care (Signed)
  Problem: Activity: Goal: Ability to tolerate increased activity will improve Outcome: Progressing   Problem: Clinical Measurements: Goal: Ability to maintain a body temperature in the normal range will improve Outcome: Progressing   Problem: Respiratory: Goal: Ability to maintain adequate ventilation will improve Outcome: Progressing   

## 2023-09-02 NOTE — Progress Notes (Signed)
Progress Note   Patient: Christian Fields LKG:[id number] DOB: September 08, 1956 DOA: 08/30/2023     3 DOS: the patient was seen and examined on 09/02/2023   Brief hospital course: HPI on admission 08/30/23:  "Christian Fields is a 67 y.o. male with medical history significant of COPD Gold stage IV, chronic hypoxic respiratory failure on 4 L continuously, HTN, HLD, IIDM, OSA not on CPAP, PVD on aspirin Plavix chronic pain syndrome on Suboxone non-small cell lung carcinoma status post treatment, COPD stage II, chronic HFpEF, diabetic neuropathy, presented with worsening of wheezing cough shortness of breath and fever.   Patient reported that he ran out of 1 of his nebulizing treatment on Wednesday and started to have wheezing and cough next day became worse yesterday, started to have subjective fever and chills and he tried to turn up oxygen to 6 L this morning to compensate but did right little help.  Also discussed the case with daughter over the phone and daughter reported the patient did run out of one of the breathing medications but she does know specifically which 1 recently and started to have trouble breathing since Wednesday when he was seen by the visiting nurse yesterday. ED Course: BiPAP 100% on 60% of oxygen, temperature 101 and borderline tachycardia borderline hypoxic.  WBC 40, with bandemia, hemoglobin 9.9, ABG 7.2 8/56/75, creatinine 1.2 bicarb 24.  Lactic acid 1.6   CT angiogram negative for PE but multifocal pneumonia predominantly on the right mid lobe and right lower lobe.  Patient was started on cefepime and doxycycline and IV bolus x 1."   Pt admitted to stepdown unit requiring acute BiPAP. Further hospital course and management as outlined below.   Assessment and Plan: * Sepsis (HCC) Evidenced by tachycardia, leukocytosis, new onset of fever, source of infection is multifocal pneumonia mainly on the right mid and lower lobe --s/p fluid resuscitation --continue IV  antibiotics --follow cultures --monitor fever curve, CBC, trend lactate  Acute on chronic respiratory failure with hypoxia (HCC) Due to multifocal pneumonia with underlying COPD chronically on 4 l/min O2 with Acute COPD Exacerbation 9/23 - improved, on baseline 4 L/min O2 --Consult Pulmonology - appreciate input --Supplement O2, wean as tolerated --Target spO2 > 88--92% --Continue IV Rocephin, Doxycycline (Itching w/ Zithromax given in ED) --Continue IV steroids --Change Duonebs & albuterol to Ipratropium and Xopenex (due to severe tachycardia this AM) --ICS and LABA --Weaned off bipap - stable to transfer to floor --Pulmonary hygiene with IS, flutter --Follow blood and sputum cultures --Mucinex & other supportive care per orders  COPD with acute exacerbation (HCC) .  Chronic heart failure with preserved ejection fraction (HFpEF) (HCC) Acute on chronic HFpEF - unclear if present on admission or due to sepsis IV fluids.  On admission, pt appeared volume depleted on admission, given IV fluids for sepsis.  Had increased tachycardic, normal lactic acid, improved O2 needs, but elevated BNP and chest tightness on 9/23 --s/p IV Lasix 20 mg x 3 doses --Repeat BMP tomorrow & assess need for further diuresis --Monitor volume status  PVD (peripheral vascular disease) (HCC) On ASA, Plavix  Chronic pain syndrome Home regimen resumed - Suboxone TID, gabapentin 800 mg PO QID No indications for escalating pain medications - avoid doing so.  Multifocal pneumonia .  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 Seen by Dr. Ether Griffins 9/17 - note reviewed --Non-weight-bearing on LLE --Follow up with Dr. Ether Griffins as scheduled --WOC consulted  for wound care - see orders --no longer on antibiotics for osteo prior to admission, now abx for PNA --Monitor closely  Insulin dependent type 2 diabetes mellitus (HCC) Family  reported pt not taking Farxiga Sliding scale Novolog        Subjective: Pt reports feeling better overall, but still not near his normal.  Breathing does not feel right yet.  He can feel that his heart races with any attempt at out of bed activity.  No other acute complaints.     Physical Exam: Vitals:   09/02/23 0734 09/02/23 0754 09/02/23 1203 09/02/23 1331  BP:  (!) 160/94 (!) 165/99   Pulse:  (!) 109 (!) 117   Resp:  18 19   Temp:  98.1 F (36.7 C) 97.9 F (36.6 C)   TempSrc:  Oral    SpO2: 93% 97% 97% 95%  Weight:      Height:         General exam: awake, alert, no acute distress, chronically ill appearing HEENT: moist mucus membranes, hearing grossly normal, nasal cannula in place  Respiratory system: on 4 L/min HFNC O2 (baseline), tachypneic, normal respiratory effort at rest, no accessory muscle use, severely diminished breath sounds with intermittent wheezes Cardiovascular system: normal S1/S2, tachycardic, regular rhythm, no murmur heard, no peripheral edema, +JVP.   Gastrointestinal system: abdomen non-tender, non-distended Central nervous system: A&O x3. no gross focal neurologic deficits, normal speech Extremities: moves all, no edema, normal tone Skin: dry, intact, no rashes seen on visualized skin Psychiatry: normal mood, congruent affect, judgment and insight appear intact   Data Reviewed:  Notable labs ---   WBC 41.2 >> 35.8 >> 28.0 >> 13.6 Hbg improving 8.4  Procal 9.11 >> 6.01  Micro: MRSA not detected C diff -- pending Sputum culture --- pending Respiratory viral panel --- pending Blood cultures - neg to date x 3 days  Family Communication: None present. Pt able to update.  Disposition: Status is: Inpatient Remains inpatient appropriate because: severity of illness, ongoing significant symptoms, persistent tachycardia, needs further clinical improvement   Planned Discharge Destination: Home    Time spent: 40 minutes   Author: Pennie Banter, DO 09/02/2023 2:21 PM  For on call review [email or web address].

## 2023-09-02 NOTE — Progress Notes (Signed)
[name] requesting to use home supplies for dressing changes for packing on foot.  Dr. Denton Lank notified and order received for patient to be able to use home supplies for dressing changes.      09/02/23 1625  Provider Notification  Provider Name/Title Esaw Grandchild DO/ Attending  Date Provider Notified 09/02/23  Time Provider Notified 1625  Method of Notification  (Secure Chat)  Notification Reason Requested by patient/family  Provider response See new orders  Date of Provider Response 09/02/23  Time of Provider Response [phone]

## 2023-09-02 NOTE — Plan of Care (Signed)
  Problem: Activity: Goal: Ability to tolerate increased activity will improve Outcome: Progressing   Problem: Respiratory: Goal: Ability to maintain adequate ventilation will improve Outcome: Progressing Goal: Ability to maintain a clear airway will improve Outcome: Progressing   Problem: Health Behavior/Discharge Planning: Goal: Ability to manage health-related needs will improve Outcome: Progressing

## 2023-09-02 NOTE — Plan of Care (Signed)

## 2023-09-03 ENCOUNTER — Other Ambulatory Visit (HOSPITAL_COMMUNITY): Payer: Self-pay

## 2023-09-03 DIAGNOSIS — J189 Pneumonia, unspecified organism: Secondary | ICD-10-CM | POA: Diagnosis not present

## 2023-09-03 DIAGNOSIS — J441 Chronic obstructive pulmonary disease with (acute) exacerbation: Secondary | ICD-10-CM | POA: Diagnosis not present

## 2023-09-03 DIAGNOSIS — R0902 Hypoxemia: Secondary | ICD-10-CM

## 2023-09-03 LAB — GLUCOSE, CAPILLARY
Glucose-Capillary: 134 mg/dL — ABNORMAL HIGH (ref 70–99)
Glucose-Capillary: 146 mg/dL — ABNORMAL HIGH (ref 70–99)
Glucose-Capillary: 121 mg/dL — ABNORMAL HIGH (ref 70–99)
Glucose-Capillary: 171 mg/dL — ABNORMAL HIGH (ref 70–99)

## 2023-09-03 LAB — PROCALCITONIN: Procalcitonin: 3.2 ng/mL

## 2023-09-03 LAB — CBC
HCT: 28.3 % — ABNORMAL LOW (ref 39.0–52.0)
Hemoglobin: 8.8 g/dL — ABNORMAL LOW (ref 13.0–17.0)
MCH: 29.3 pg (ref 26.0–34.0)
MCHC: 31.1 g/dL (ref 30.0–36.0)
MCV: 94.3 fL (ref 80.0–100.0)
Platelets: 326 10*3/uL (ref 150–400)
RBC: 3 MIL/uL — ABNORMAL LOW (ref 4.22–5.81)
RDW: 15.8 % — ABNORMAL HIGH (ref 11.5–15.5)
WBC: 9 10*3/uL (ref 4.0–10.5)
nRBC: 0.3 % — ABNORMAL HIGH (ref 0.0–0.2)

## 2023-09-03 LAB — BASIC METABOLIC PANEL
Anion gap: 9 (ref 5–15)
BUN: 29 mg/dL — ABNORMAL HIGH (ref 8–23)
CO2: 28 mmol/L (ref 22–32)
Calcium: 7.4 mg/dL — ABNORMAL LOW (ref 8.9–10.3)
Chloride: 102 mmol/L (ref 98–111)
Creatinine, Ser: 1.01 mg/dL (ref 0.61–1.24)
GFR, Estimated: >60 mL/min (ref >60)
Glucose, Bld: 230 mg/dL — ABNORMAL HIGH (ref 70–99)
Potassium: 3.3 mmol/L — ABNORMAL LOW (ref 3.5–5.1)
Sodium: 139 mmol/L (ref 135–145)

## 2023-09-03 LAB — MAGNESIUM: Magnesium: 1.3 mg/dL — ABNORMAL LOW (ref 1.7–2.4)

## 2023-09-03 LAB — C-REACTIVE PROTEIN: CRP: 3.7 mg/dL — ABNORMAL HIGH (ref <1.0)

## 2023-09-03 MED ORDER — POTASSIUM CHLORIDE 20 MEQ PO PACK
40.0000 meq | PACK | Freq: Once | ORAL | Status: AC
  Administered 2023-09-03: 40 meq via ORAL
  Filled 2023-09-03: qty 2

## 2023-09-03 MED ORDER — METOPROLOL SUCCINATE ER 50 MG PO TB24
50.0000 mg | ORAL_TABLET | Freq: Every day | ORAL | Status: DC
  Administered 2023-09-03 – 2023-09-04 (×2): 50 mg via ORAL
  Filled 2023-09-03 (×2): qty 1

## 2023-09-03 MED ORDER — AMOXICILLIN-POT CLAVULANATE 875-125 MG PO TABS
1.0000 | ORAL_TABLET | Freq: Two times a day (BID) | ORAL | Status: DC
  Administered 2023-09-03 – 2023-09-04 (×2): 1 via ORAL
  Filled 2023-09-03 (×2): qty 1

## 2023-09-03 MED ORDER — MAGNESIUM SULFATE 4 GM/100ML IV SOLN
4.0000 g | Freq: Once | INTRAVENOUS | Status: AC
  Administered 2023-09-03: 4 g via INTRAVENOUS
  Filled 2023-09-03 (×2): qty 100

## 2023-09-03 MED ORDER — DAPAGLIFLOZIN PROPANEDIOL 10 MG PO TABS
10.0000 mg | ORAL_TABLET | Freq: Every day | ORAL | Status: DC
  Administered 2023-09-03 – 2023-09-04 (×2): 10 mg via ORAL
  Filled 2023-09-03 (×2): qty 1

## 2023-09-03 MED ORDER — METHYLPREDNISOLONE SODIUM SUCC 40 MG IJ SOLR
40.0000 mg | INTRAMUSCULAR | Status: DC
  Administered 2023-09-04: 40 mg via INTRAVENOUS
  Filled 2023-09-03: qty 1

## 2023-09-03 NOTE — Progress Notes (Signed)
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    NWBing LLE   Clinical Impression  Patient supine in bed upon arrival, agreeable to participate in PT evaluation. Prior to admission, patient was utilizing knee scooter to get around home due to  NWBing on LLE, but unsure of validity of maintaining precautions consistently. Patient was Mod I with bed mobility with vitals stable. Patient able to stand at EOB x 2 reps with CGA, able to maintain precautions but cues required. Patient's HR elevated to mid to low 140's with standing. Encouraged trial of knee scooter but patient refusing this date. Patient did demo mild impulsivity requiring cues for safety throughout session. Patient will benefit from skilled acute PT services to address impairments (see below for additional details) and to facilitate return to PLOF.       If plan is discharge home, recommend the following: A little help with walking and/or transfers;A little help with bathing/dressing/bathroom;Assist for transportation;Help with stairs or ramp for entrance   Can travel by private vehicle        Equipment Recommendations BSC/3in1  Recommendations for Other Services       Functional Status Assessment Patient has had a recent decline in their functional status and demonstrates the ability to make significant improvements in function in a reasonable and predictable amount of time.      Precautions / Restrictions Precautions Precautions: Fall Restrictions Weight Bearing Restrictions: Yes LLE Weight Bearing: Non weight bearing      Mobility  Bed Mobility Overal bed mobility: Modified Independent             General bed mobility comments: patient able to get to EOB without physical assist; use of rails. Patient impulsive with movement. HR seated at EOB low to mid 120's.    Transfers Overall transfer level: Needs assistance Equipment used: None Transfers: Sit to/from Stand Sit to Stand: Contact guard assist           General transfer comment: Pt impulsive with stand, trying to place arm around therapist neck, verbal cues for technique required. Encouraged to push from lower surface. Patient able to maintain weight bearing precautions this date. Completed sit > stand x 2 reps with CGA for steadying    Ambulation/Gait               General Gait Details: encouraged trial of knee scooter as was using prior to admission, patient refused this date.  Stairs            Wheelchair Mobility     Tilt Bed    Modified Rankin (Stroke Patients Only)       Balance Overall balance assessment: Needs assistance Sitting-balance support: Feet supported Sitting balance-Leahy Scale: Normal     Standing balance support: No upper extremity supported Standing balance-Leahy Scale: Fair Standing balance comment: some mild unsteadiness when maintaining weight bearing precautions  Pertinent Vitals/Pain Pain Assessment Pain Assessment: 0-10 Pain Score: 2  Pain Location: L Foot Pain Descriptors / Indicators: Aching Pain Intervention(s): Limited activity within patient's tolerance, Monitored during session    Home Living Family/patient expects to be discharged to:: Private residence Living Arrangements: Children (Son) Available Help at Discharge: Family;Available PRN/intermittently Type of Home: Mobile  home Home Access: Stairs to enter Entrance Stairs-Rails: Right;Left;Can reach both Entrance Stairs-Number of Steps: 8 (to porch)   Home Layout: One level Home Equipment: Rollator (4 wheels);Rolling Walker (2 wheels);Other (comment) (Knee Scooter) Additional Comments: Home o2 use, was using knee scooter for house mobility.    Prior Function Prior Level of Function : Needs assist             Mobility Comments: Pt reports using knee scooter Mod I around house; needs assist getting up 7-8 steps, patient told this Thereasa Parkin he is "scooting" up the stairs, per OT note patient reports his children "carry him" ADLs Comments: reports MOD I with ADL, assist as needed (cooking, cleaning, driving) from son     Extremity/Trunk Assessment   Upper Extremity Assessment Upper Extremity Assessment: Overall WFL for tasks assessed    Lower Extremity Assessment Lower Extremity Assessment: Generalized weakness LLE Deficits / Details: Some generalized weakness noted with functional mobility; L foot covered with dressing where reported wound is    Cervical / Trunk Assessment Cervical / Trunk Assessment: Normal  Communication   Communication Communication: No apparent difficulties  Cognition Arousal: Alert Behavior During Therapy: Impulsive, WFL for tasks assessed/performed Overall Cognitive Status: No family/caregiver present to determine baseline cognitive functioning Area of Impairment: Safety/judgement                                        General Comments      Exercises Other Exercises Other Exercises: Educated on role of PT, weight bearing precautoins and importance of maintaining those with mobility, safe DME use   Assessment/Plan    PT Assessment Patient needs continued PT services  PT Problem List Decreased strength;Decreased activity tolerance;Decreased balance;Decreased mobility       PT Treatment Interventions DME instruction;Gait training;Stair  training;Functional mobility training;Therapeutic activities;Therapeutic exercise;Neuromuscular re-education;Balance training    PT Goals (Current goals can be found in the Care Plan section)  Acute Rehab PT Goals Patient Stated Goal: get to feeling better and back home PT Goal Formulation: With patient Time For Goal Achievement: 09/17/23 Potential to Achieve Goals: Fair    Frequency Min 1X/week     Co-evaluation               AM-PAC PT "6 Clicks" Mobility  Outcome Measure Help needed turning from your back to your side while in a flat bed without using bedrails?: None Help needed moving from lying on your back to sitting on the side of a flat bed without using bedrails?: None Help needed moving to and from a bed to a chair (including a wheelchair)?: A Little Help needed standing up from a chair using your arms (e.g., wheelchair or bedside chair)?: A Little Help needed to walk in hospital room?: A Little Help needed climbing 3-5 steps with a railing? : A Lot 6 Click Score: 19    End of Session Equipment Utilized During Treatment: Gait belt Activity Tolerance: Patient tolerated treatment well Patient left: in bed;with call bell/phone within reach Nurse Communication: Mobility status PT Visit Diagnosis: Unsteadiness on feet (  R26.81);Other abnormalities of gait and mobility (R26.89);Muscle weakness (generalized) (M62.81)    Time: 0102-7253 PT Time Calculation (min) (ACUTE ONLY): 16 min   Charges:   PT Evaluation $PT Eval Low Complexity: 1 Low   PT General Charges $$ ACUTE PT VISIT: 1 Visit        Creed Copper Fairly, PT, DPT 09/03/23 10:34 AM

## 2023-09-03 NOTE — Consult Note (Signed)
PULMONOLOGY         Date: 09/03/2023,   MRN# [id number] Christian Fields Dec 18, 1955     AdmissionWeight: 79.1 kg                 CurrentWeight: 74.4 kg  Referring provider: Dr Denton Lank   CHIEF COMPLAINT:   Acute on chronic hypoxemic respiratory failure   HISTORY OF PRESENT ILLNESS   Christian Fields is a 67 y.o. male with medical history significant of advanced COPD , chronic hypoxic respiratory failure on 4 L continuously, HTN, HLD, IIDM, OSA not on CPAP, PVD on aspirin Plavix chronic pain syndrome on Suboxone non-small cell lung carcinoma status post treatment, chronic HFpEF, diabetic neuropathy, presented with worsening of wheezing cough shortness of breath and fever. He reports worsening dyspnea and wheezing once he ran out of his meds. He required BIPAP at 60% with fever and CTPE done showing multifocal pneumonia worse on right lung. PCCM consultation for further evaluation.    09/02/23- patient is stable on 4L/min Leslie.  He is feeling better and is being treated for pneumonia as well as deconditioning and is starting PT/OT.  Crp is trending down appropriately  PAST MEDICAL HISTORY   Past Medical History:  Diagnosis Date   Anemia    Anxiety    Arthritis    Asthma    Cancer (HCC)    Basal Cell Skin Cancer   Chronic back pain    COPD (chronic obstructive pulmonary disease) (HCC)    Depression    Diabetes mellitus (HCC)    Dyspnea    GERD (gastroesophageal reflux disease)    Gout    Gout    Headache    History of blood clots    Left Leg--July 2018   History of kidney stones    Hyperlipidemia    Hyperlipidemia    Hypertension    Kidney stones    Neuropathy    On home oxygen therapy    2 L / M   Pneumonia 06/2017   Sleep apnea    Ulcer of foot (HCC)    Right     SURGICAL HISTORY   Past Surgical History:  Procedure Laterality Date   APPENDECTOMY     DG FEET 2 VIEWS BILAT     IRRIGATION AND DEBRIDEMENT FOOT Left 07/11/2023   Procedure: IRRIGATION  AND DEBRIDEMENT FOOT;  Surgeon: Gwyneth Revels, DPM;  Location: [facility];  Service: Orthopedics/Podiatry;  Laterality: Left;   LIPOMA EXCISION Right 08/15/2017   Procedure: EXCISION TUMOR(CYST) FOOT;  Surgeon: Recardo Evangelist, DPM;  Location: [facility];  Service: Podiatry;  Laterality: Right;   LOWER EXTREMITY ANGIOGRAPHY Left 07/03/2023   Procedure: Lower Extremity Angiography;  Surgeon: Annice Needy, MD;  Location: [facility];  Service: Cardiovascular;  Laterality: Left;   OTHER SURGICAL HISTORY Bilateral Foot surgery     FAMILY HISTORY   Family History  Problem Relation Age of Onset   Hypertension Mother      SOCIAL HISTORY   Social History   Tobacco Use   Smoking status: Former    Current packs/day: 0.50    Average packs/day: 0.5 packs/day for 6.7 years (3.4 ttl pk-yrs)    Types: Cigarettes    Start date: 2018   Smokeless tobacco: Never  Vaping Use   Vaping status: Never Used  Substance Use Topics   Alcohol use: Yes    Alcohol/week: 1.0 standard drink of alcohol    Types: 1 Cans of beer  per week    Comment: occ   Drug use: No     MEDICATIONS    Home Medication:    Current Medication:  Current Facility-Administered Medications:    0.9 %  sodium chloride infusion, , Intravenous, PRN, Pennie Banter, DO, Stopped at 09/02/23 1458   acetaminophen (TYLENOL) tablet 650 mg, 650 mg, Oral, Q6H PRN, Mikey College T, MD, 650 mg at 08/31/23 0335   albuterol (PROVENTIL) (2.5 MG/3ML) 0.083% nebulizer solution 2.5 mg, 2.5 mg, Nebulization, Q4H PRN, Mikey College T, MD, 2.5 mg at 09/01/23 0520   allopurinol (ZYLOPRIM) tablet 600 mg, 600 mg, Oral, Daily, Chipper Herb, Ping T, MD, 600 mg at 09/03/23 1000   aspirin EC tablet 81 mg, 81 mg, Oral, Daily, Chipper Herb, Ping T, MD, 81 mg at 09/03/23 1003   azelastine (ASTELIN) 0.1 % nasal spray 2 spray, 2 spray, Each Nare, BID PRN, Emeline General, MD   bisacodyl (DULCOLAX) EC tablet 5 mg, 5 mg, Oral, Daily PRN, Mikey College T, MD    buprenorphine-naloxone (SUBOXONE) 8-2 mg per SL tablet 1 tablet, 1 tablet, Sublingual, Q8H, Esaw Grandchild A, DO, 1 tablet at 09/03/23 1333   cefTRIAXone (ROCEPHIN) 2 g in sodium chloride 0.9 % 100 mL IVPB, 2 g, Intravenous, Q24H, Zhang, Ping T, MD, Last Rate: 200 mL/hr at 09/02/23 2116, 2 g at 09/02/23 2116   clopidogrel (PLAVIX) tablet 75 mg, 75 mg, Oral, Daily, Chipper Herb, Ping T, MD, 75 mg at 09/03/23 1004   dapagliflozin propanediol (FARXIGA) tablet 10 mg, 10 mg, Oral, Daily, Djan, Prince T, MD, 10 mg at 09/03/23 1007   doxycycline (VIBRAMYCIN) 100 mg in sodium chloride 0.9 % 250 mL IVPB, 100 mg, Intravenous, Q12H, Belue, Lendon Collar, RPH, Last Rate: 125 mL/hr at 09/03/23 1015, 100 mg at 09/03/23 1015   DULoxetine (CYMBALTA) DR capsule 30 mg, 30 mg, Oral, Daily, Chipper Herb, Ping T, MD, 30 mg at 09/03/23 1003   enoxaparin (LOVENOX) injection 40 mg, 40 mg, Subcutaneous, Q24H, Mikey College T, MD, 40 mg at 09/02/23 2115   ezetimibe (ZETIA) tablet 10 mg, 10 mg, Oral, Daily, Chipper Herb, Ping T, MD, 10 mg at 09/03/23 1002   fluticasone (FLONASE) 50 MCG/ACT nasal spray 1 spray, 1 spray, Each Nare, Daily PRN, Mikey College T, MD   fluticasone furoate-vilanterol (BREO ELLIPTA) 100-25 MCG/ACT 1 puff, 1 puff, Inhalation, Daily, 1 puff at 09/03/23 0951 **AND** umeclidinium bromide (INCRUSE ELLIPTA) 62.5 MCG/ACT 1 puff, 1 puff, Inhalation, Daily, Mikey College T, MD, 1 puff at 09/03/23 0952   gabapentin (NEURONTIN) capsule 800 mg, 800 mg, Oral, Q6H, Mansy, Jan A, MD, 800 mg at 09/03/23 1333   guaiFENesin (MUCINEX) 12 hr tablet 600 mg, 600 mg, Oral, BID, Chipper Herb, Ping T, MD, 600 mg at 09/03/23 1003   insulin aspart (novoLOG) injection 0-15 Units, 0-15 Units, Subcutaneous, TID WC, Mikey College T, MD, 2 Units at 09/03/23 1339   insulin aspart (novoLOG) injection 0-5 Units, 0-5 Units, Subcutaneous, QHS, Zhang, Ping T, MD, 2 Units at 08/30/23 2232   ipratropium (ATROVENT) nebulizer solution 0.5 mg, 0.5 mg, Nebulization, Q6H, Esaw Grandchild  A, DO, 0.5 mg at 09/03/23 1359   levalbuterol (XOPENEX) nebulizer solution 0.63 mg, 0.63 mg, Nebulization, Once, Esaw Grandchild A, DO   levalbuterol (XOPENEX) nebulizer solution 0.63 mg, 0.63 mg, Nebulization, Q6H, Griffith, Kelly A, DO, 0.63 mg at 09/03/23 1359   magnesium sulfate IVPB 4 g 100 mL, 4 g, Intravenous, Once, Rosezetta Schlatter T, MD, Last Rate: 50 mL/hr at 09/03/23 1431, 4 g  at 09/03/23 1431   melatonin tablet 2.5 mg, 2.5 mg, Oral, QHS PRN, Mansy, Jan A, MD, 2.5 mg at 09/02/23 2114   methylPREDNISolone sodium succinate (SOLU-MEDROL) 40 mg/mL injection 40 mg, 40 mg, Intravenous, Q12H, Otelia Sergeant, RPH, 40 mg at 09/03/23 5621   metoprolol succinate (TOPROL-XL) 24 hr tablet 50 mg, 50 mg, Oral, Daily, Djan, Scarlette Calico, MD, 50 mg at 09/03/23 1003   metoprolol tartrate (LOPRESSOR) injection 5 mg, 5 mg, Intravenous, Q4H PRN, Esaw Grandchild A, DO, 5 mg at 09/01/23 1103   ondansetron (ZOFRAN) tablet 4 mg, 4 mg, Oral, Q6H PRN **OR** ondansetron (ZOFRAN) injection 4 mg, 4 mg, Intravenous, Q6H PRN, Emeline General, MD   pantoprazole (PROTONIX) EC tablet 40 mg, 40 mg, Oral, Daily, Mikey College T, MD, 40 mg at 09/03/23 1003   roflumilast (DALIRESP) tablet 500 mcg, 500 mcg, Oral, Daily, Mikey College T, MD, 500 mcg at 09/03/23 1007   simvastatin (ZOCOR) tablet 40 mg, 40 mg, Oral, Daily, Chipper Herb, Ping T, MD, 40 mg at 09/03/23 1002    ALLERGIES   Bee venom, Linezolid, and Azithromycin     REVIEW OF SYSTEMS    Review of Systems:  Gen:  Denies  fever, sweats, chills weigh loss  HEENT: Denies blurred vision, double vision, ear pain, eye pain, hearing loss, nose bleeds, sore throat Cardiac:  No dizziness, chest pain or heaviness, chest tightness,edema Resp:   reports dyspnea chronically  Gi: Denies swallowing difficulty, stomach pain, nausea or vomiting, diarrhea, constipation, bowel incontinence Gu:  Denies bladder incontinence, burning urine Ext:   Denies Joint pain, stiffness or swelling Skin:  Denies  skin rash, easy bruising or bleeding or hives Endoc:  Denies polyuria, polydipsia , polyphagia or weight change Psych:   Denies depression, insomnia or hallucinations   Other:  All other systems negative   VS: BP (!) 152/93 (BP Location: Right Arm)   Pulse 98   Temp 98.1 F (36.7 C)   Resp 15   Ht 5\' 5"  (1.651 m)   Wt 74.4 kg   SpO2 94%   BMI 27.29 kg/m      PHYSICAL EXAM    GENERAL:NAD, no fevers, chills, no weakness no fatigue HEAD: Normocephalic, atraumatic.  EYES: Pupils equal, round, reactive to light. Extraocular muscles intact. No scleral icterus.  MOUTH: Moist mucosal membrane. Dentition intact. No abscess noted.  EAR, NOSE, THROAT: Clear without exudates. No external lesions.  NECK: Supple. No thyromegaly. No nodules. No JVD.  PULMONARY: decreased breath sounds with mild rhonchi worse at bases bilaterally.  CARDIOVASCULAR: S1 and S2. Regular rate and rhythm. No murmurs, rubs, or gallops. No edema. Pedal pulses 2+ bilaterally.  GASTROINTESTINAL: Soft, nontender, nondistended. No masses. Positive bowel sounds. No hepatosplenomegaly.  MUSCULOSKELETAL: No swelling, clubbing, or edema. Range of motion full in all extremities.  NEUROLOGIC: Cranial nerves II through XII are intact. No gross focal neurological deficits. Sensation intact. Reflexes intact.  SKIN: No ulceration, lesions, rashes, or cyanosis. Skin warm and dry. Turgor intact.  PSYCHIATRIC: Mood, affect within normal limits. The patient is awake, alert and oriented x 3. Insight, judgment intact.       IMAGING    Narrative & Impression  CLINICAL DATA:  Difficulty breathing since last night with hypoxia. Possible pulmonary embolism. History of lung cancer.   EXAM: CT ANGIOGRAPHY CHEST WITH CONTRAST   TECHNIQUE: Multidetector CT imaging of the chest was performed using the standard protocol during bolus administration of intravenous contrast. Multiplanar CT image reconstructions and MIPs  were obtained to evaluate the vascular anatomy.   RADIATION DOSE REDUCTION: This exam was performed according to the departmental dose-optimization program which includes automated exposure control, adjustment of the mA and/or kV according to patient size and/or use of iterative reconstruction technique.   CONTRAST:  75mL OMNIPAQUE IOHEXOL 350 MG/ML SOLN   COMPARISON:  08/26/2023, 01/24/2023   FINDINGS: Cardiovascular: Heart is normal size. Calcified plaque over the left main, left anterior descending and lateral circumflex coronary arteries. Mild calcification of the aortic root. Thoracic aorta is normal in caliber. There is mild calcified plaque over the descending thoracic aorta. Pulmonary arterial system is well opacified without evidence of emboli. Remaining vascular structures are unremarkable.   Mediastinum/Nodes: Evidence mediastinal and bilateral hilar adenopathy. Precarinal lymph node measuring 1.5 cm and subcarinal lymph node measuring 0.7 cm. A couple right hilar lymph nodes measure 1.5 cm. These findings have progressed slightly compared to the prior exam. Remaining mediastinal structures are unremarkable.   Lungs/Pleura: The lungs are adequately inflated demonstrate moderate centrilobular emphysematous disease. Stable sub pleural irregular masslike opacity measuring 1.6 x 1.9 cm thought to represent post treatment change of the region patient's original left lung cancer. Interval development of patchy peripheral nodular airspace process predominately within the right lung and only minimally in the left lung suggesting acute infection. No significant effusion. Airways are unremarkable.   Upper Abdomen: Calcified plaque of the abdominal aorta. No acute findings. Several bilateral renal cysts unchanged with the larger measuring 2.8 cm of the left midpole. No follow-up imaging is recommended.   Musculoskeletal: Stable mild compression fracture over the  upper thoracic spine. Old sternal fracture.   Review of the MIP images confirms the above findings.   IMPRESSION: 1. No evidence of pulmonary embolism. 2. Interval development of patchy peripheral nodular airspace process predominately within the right lung and only minimally in the left lung suggesting acute infection. 3. Stable sub pleural irregular masslike opacity measuring 1.6 x 1.9 cm thought to represent post treatment change in the region patient's original left lung cancer. 4. Mediastinal and bilateral hilar adenopathy slightly progressed compared to the prior exam and may be reactive to patient's suspected pulmonary infection as described above. 5. Aortic atherosclerosis. Atherosclerotic coronary artery disease. 6. Stable bilateral renal cysts.   Aortic Atherosclerosis (ICD10-I70.0) and Emphysema (ICD10-J43.9).     Electronically Signed   By: Elberta Fortis M.D.   On: 08/30/2023 13:04   ASSESSMENT/PLAN   Acute on chronic hypoxemic respiratory failure    - Acute hypoxemic respiratory failure - present on admission  - COVID19 NEGATIVE  - supplemental O2 during my evaluation -4l/MIN -Respiratory viral panel-ORDERED -legionella ab-ORDERED -strep pneumoniae ur AG-ORDERED -sputum resp cultures- ORDERED -reviewed pertinent imaging with patient today - ESR -PT/OT for d/c planning  -please encourage patient to use incentive spirometer few times each hour while hospitalized.      Advanced COPD with chronic hypoxemia   - continue supplemental O2 with goal SpO2 >88%-92%   - agree with solumedrol - please wean as able   - agree with Daliresp 500 per home dose   - agre with xopenex and atrovent q6h  -agree with breo and incruse ellipta      Left lung cancer     - chronic stable with repeat Imaging not worrisome for recurrence     Thank you for allowing me to participate in the care of this patient.   Patient/Family are satisfied with care plan and all  questions have been answered.  Provider disclosure: Patient with at least one acute or chronic illness or injury that poses a threat to life or bodily function and is being managed actively during this encounter.  All of the below services have been performed independently by signing provider:  review of prior documentation from internal and or external health records.  Review of previous and current lab results.  Interview and comprehensive assessment during patient visit today. Review of current and previous chest radiographs/CT scans. Discussion of management and test interpretation with health care team and patient/family.   This document was prepared using Dragon voice recognition software and may include unintentional dictation errors.     Vida Rigger, M.D.  Division of Pulmonary & Critical Care Medicine

## 2023-09-03 NOTE — Progress Notes (Signed)
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    NWBing LLE   OT comments  Pt seen for OT tx this date. Pt requests to wash up, OT provides setup for pt to wash hair/face. MOD I with bed mobility; however, demonstrates impulsive movements and limited safety awareness. Cuing required to redirect pt from standing using BLE and maintain NWB. Pt also unaware of IV and O2 line, attempting to doff gown without waiting for OT to manage lines/leads. Pt requires frequent multimodal cuing to take rest breaks due to increased dyspnea upon exertion, and for overall safety awareness. Education on energy conservation strategies and environmental awareness due to lines/leads present. Pt verbalizing understanding.   Of note, HR increasing to 107 with bathing task, SpO2 88% on 4L with activity. HR drops to 98 with rest; SpO2% increasing to 95% with PLB techniques. Pt left in bed, needs within reach, heels floated and alarm activated. Pt is progressing toward OT goals and continues to benefit from skilled OT services to maximize return to PLOF and minimize risk of future falls, injury, caregiver burden, and readmission. Will continue to follow POC as written. Discharge recommendation remains appropriate.        If plan is discharge home, recommend the following:  A lot of help with walking and/or transfers;A little help with bathing/dressing/bathroom;Assistance with  cooking/housework;Assist for transportation;Help with stairs or ramp for entrance   Equipment Recommendations  BSC/3in1    Recommendations for Other Services Other (comment)    Precautions / Restrictions Precautions Precautions: Fall Restrictions Weight Bearing Restrictions: Yes LLE Weight Bearing: Non weight bearing       Mobility Bed Mobility Overal bed mobility: Modified Independent             General bed mobility comments: Impulsive. Gets OOB without physical assist, immediately attempts to stand up on BLE, cues to redirect and maintain NB    Transfers                         Balance Overall balance assessment: Needs assistance Sitting-balance support: Feet supported Sitting balance-Leahy Scale: Normal                                     ADL either performed or assessed with clinical judgement   ADL Overall ADL's : Needs assistance/impaired     Grooming: Sitting;Set up;Brushing hair;Wash/dry face;Wash/dry hands           Upper Body Dressing : Set up;Sitting;Cueing for safety Upper Body Dressing Details (indicate cue type and reason): Attempts to doff gown - cues to terminate activity as pt was getting himself tangled in O2/IV lines and unaware                   General ADL Comments: Sits EOB for UB bathing/washing hair    Extremity/Trunk Assessment Upper Extremity  Assessment Upper Extremity Assessment: Overall WFL for tasks assessed   Lower Extremity Assessment Lower Extremity Assessment: Generalized weakness LLE Deficits / Details: Some generalized weakness noted with functional mobility; L foot covered with dressing where reported wound is   Cervical / Trunk Assessment Cervical / Trunk Assessment: Normal     Cognition Arousal: Alert Behavior During Therapy: Impulsive, WFL for tasks assessed/performed Overall Cognitive Status: No family/caregiver present to determine baseline cognitive functioning Area of  Impairment: Safety/judgement                         Safety/Judgement: Decreased awareness of safety, Decreased awareness of deficits     General Comments: VERY impulsive, does not pay attention to IV + O2 lines or LLE precautions        Exercises Other Exercises Other Exercises: Edu on NWB precautions and overall safety awareness       General Comments HR increasing to 107 with bathing task, SpO2 88% on 4L with activity. HR drops to 98 with rest; SpO2% increasing to 95% with PLB techniques and rest breaks    Pertinent Vitals/ Pain       Pain Assessment Pain Assessment: 0-10 Pain Score: 2  Pain Location: L Foot Pain Descriptors / Indicators: Aching Pain Intervention(s): Limited activity within patient's tolerance  Home Living Family/patient expects to be discharged to:: Private residence Living Arrangements: Children (Son) Available Help at Discharge: Family;Available PRN/intermittently Type of Home: Mobile home Home Access: Stairs to enter Entrance Stairs-Number of Steps: 8 (to porch) Entrance Stairs-Rails: Right;Left;Can reach both Home Layout: One level     Bathroom Shower/Tub: Tub/shower unit (sits in tub with LLE over tub)   Bathroom Toilet: Standard     Home Equipment: Rollator (4 wheels);Rolling Walker (2 wheels);Other (comment) (Knee Scooter)   Additional Comments: Home o2 use, was using knee scooter for house mobility.          Frequency  Min 1X/week        Progress Toward Goals  OT Goals(current goals can now be found in the care plan section)  Progress towards OT goals: Progressing toward goals  Acute Rehab OT Goals OT Goal Formulation: With patient Time For Goal Achievement: 09/16/23 Potential to Achieve Goals: Good  Plan      Co-evaluation                 AM-PAC OT "6 Clicks" Daily Activity     Outcome Measure   Help from another person eating meals?: None Help from another person taking care of personal grooming?:  None Help from another person toileting, which includes using toliet, bedpan, or urinal?: A Little Help from another person bathing (including washing, rinsing, drying)?: A Little Help from another person to put on and taking off regular upper body clothing?: None Help from another person to put on and taking off regular lower body clothing?: A Little 6 Click Score: 21    End of Session Equipment Utilized During Treatment: Oxygen  OT Visit Diagnosis: Other abnormalities of gait and mobility (R26.89)   Activity Tolerance Patient tolerated treatment well   Patient Left in bed;with call bell/phone within reach;with bed alarm set   Nurse Communication Mobility status;Other (comment) (urine output)        Time: 6440-3474 OT Time Calculation (min): 31 min  Charges: OT General Charges $OT Visit: 1 Visit OT Treatments $Self Care/Home Management : 23-37 mins  Graiden Henes L. Shiann Kam, OTR/L  09/03/23, 11:44 AM

## 2023-09-03 NOTE — Consult Note (Signed)
PULMONOLOGY         Date: 09/03/2023,   MRN# [id number] DESMAN BALLARD 12/22/1955     AdmissionWeight: 79.1 kg                 CurrentWeight: 74.4 kg  Referring provider: Dr Denton Lank   CHIEF COMPLAINT:   Acute on chronic hypoxemic respiratory failure   HISTORY OF PRESENT ILLNESS   Christian Fields is a 67 y.o. male with medical history significant of advanced COPD , chronic hypoxic respiratory failure on 4 L continuously, HTN, HLD, IIDM, OSA not on CPAP, PVD on aspirin Plavix chronic pain syndrome on Suboxone non-small cell lung carcinoma status post treatment, chronic HFpEF, diabetic neuropathy, presented with worsening of wheezing cough shortness of breath and fever. He reports worsening dyspnea and wheezing once he ran out of his meds. He required BIPAP at 60% with fever and CTPE done showing multifocal pneumonia worse on right lung. PCCM consultation for further evaluation.    09/02/23- patient is stable on 4L/min .  He is feeling better and is being treated for pneumonia as well as deconditioning and is starting PT/OT.  Crp is trending down appropriately   09/03/23- patient continues to improve, his steroids are being weaned and he is now on PO antibiotics  PAST MEDICAL HISTORY   Past Medical History:  Diagnosis Date   Anemia    Anxiety    Arthritis    Asthma    Cancer (HCC)    Basal Cell Skin Cancer   Chronic back pain    COPD (chronic obstructive pulmonary disease) (HCC)    Depression    Diabetes mellitus (HCC)    Dyspnea    GERD (gastroesophageal reflux disease)    Gout    Gout    Headache    History of blood clots    Left Leg--July 2018   History of kidney stones    Hyperlipidemia    Hyperlipidemia    Hypertension    Kidney stones    Neuropathy    On home oxygen therapy    2 L / M   Pneumonia 06/2017   Sleep apnea    Ulcer of foot (HCC)    Right     SURGICAL HISTORY   Past Surgical History:  Procedure Laterality Date   APPENDECTOMY      DG FEET 2 VIEWS BILAT     IRRIGATION AND DEBRIDEMENT FOOT Left 07/11/2023   Procedure: IRRIGATION AND DEBRIDEMENT FOOT;  Surgeon: Gwyneth Revels, DPM;  Location: [facility];  Service: Orthopedics/Podiatry;  Laterality: Left;   LIPOMA EXCISION Right 08/15/2017   Procedure: EXCISION TUMOR(CYST) FOOT;  Surgeon: Recardo Evangelist, DPM;  Location: [facility];  Service: Podiatry;  Laterality: Right;   LOWER EXTREMITY ANGIOGRAPHY Left 07/03/2023   Procedure: Lower Extremity Angiography;  Surgeon: Annice Needy, MD;  Location: [facility];  Service: Cardiovascular;  Laterality: Left;   OTHER SURGICAL HISTORY Bilateral Foot surgery     FAMILY HISTORY   Family History  Problem Relation Age of Onset   Hypertension Mother      SOCIAL HISTORY   Social History   Tobacco Use   Smoking status: Former    Current packs/day: 0.50    Average packs/day: 0.5 packs/day for 6.7 years (3.4 ttl pk-yrs)    Types: Cigarettes    Start date: 2018   Smokeless tobacco: Never  Vaping Use   Vaping status: Never Used  Substance Use Topics   Alcohol  use: Yes    Alcohol/week: 1.0 standard drink of alcohol    Types: 1 Cans of beer per week    Comment: occ   Drug use: No     MEDICATIONS    Home Medication:    Current Medication:  Current Facility-Administered Medications:    0.9 %  sodium chloride infusion, , Intravenous, PRN, Pennie Banter, DO, Stopped at 09/02/23 1458   acetaminophen (TYLENOL) tablet 650 mg, 650 mg, Oral, Q6H PRN, Mikey College T, MD, 650 mg at 08/31/23 0335   albuterol (PROVENTIL) (2.5 MG/3ML) 0.083% nebulizer solution 2.5 mg, 2.5 mg, Nebulization, Q4H PRN, Mikey College T, MD, 2.5 mg at 09/01/23 0520   allopurinol (ZYLOPRIM) tablet 600 mg, 600 mg, Oral, Daily, Chipper Herb, Ping T, MD, 600 mg at 09/03/23 1000   aspirin EC tablet 81 mg, 81 mg, Oral, Daily, Chipper Herb, Ping T, MD, 81 mg at 09/03/23 1003   azelastine (ASTELIN) 0.1 % nasal spray 2 spray, 2 spray, Each Nare, BID PRN, Mikey College T, MD   bisacodyl (DULCOLAX) EC tablet 5 mg, 5 mg, Oral, Daily PRN, Mikey College T, MD   buprenorphine-naloxone (SUBOXONE) 8-2 mg per SL tablet 1 tablet, 1 tablet, Sublingual, Q8H, Griffith, Kelly A, DO, 1 tablet at 09/03/23 1333   cefTRIAXone (ROCEPHIN) 2 g in sodium chloride 0.9 % 100 mL IVPB, 2 g, Intravenous, Q24H, Zhang, Ping T, MD, Last Rate: 200 mL/hr at 09/02/23 2116, 2 g at 09/02/23 2116   clopidogrel (PLAVIX) tablet 75 mg, 75 mg, Oral, Daily, Chipper Herb, Ping T, MD, 75 mg at 09/03/23 1004   dapagliflozin propanediol (FARXIGA) tablet 10 mg, 10 mg, Oral, Daily, Djan, Prince T, MD, 10 mg at 09/03/23 1007   doxycycline (VIBRAMYCIN) 100 mg in sodium chloride 0.9 % 250 mL IVPB, 100 mg, Intravenous, Q12H, Belue, Lendon Collar, RPH, Last Rate: 125 mL/hr at 09/03/23 1015, 100 mg at 09/03/23 1015   DULoxetine (CYMBALTA) DR capsule 30 mg, 30 mg, Oral, Daily, Chipper Herb, Ping T, MD, 30 mg at 09/03/23 1003   enoxaparin (LOVENOX) injection 40 mg, 40 mg, Subcutaneous, Q24H, Mikey College T, MD, 40 mg at 09/02/23 2115   ezetimibe (ZETIA) tablet 10 mg, 10 mg, Oral, Daily, Chipper Herb, Ping T, MD, 10 mg at 09/03/23 1002   fluticasone (FLONASE) 50 MCG/ACT nasal spray 1 spray, 1 spray, Each Nare, Daily PRN, Mikey College T, MD   fluticasone furoate-vilanterol (BREO ELLIPTA) 100-25 MCG/ACT 1 puff, 1 puff, Inhalation, Daily, 1 puff at 09/03/23 0951 **AND** umeclidinium bromide (INCRUSE ELLIPTA) 62.5 MCG/ACT 1 puff, 1 puff, Inhalation, Daily, Mikey College T, MD, 1 puff at 09/03/23 0952   gabapentin (NEURONTIN) capsule 800 mg, 800 mg, Oral, Q6H, Mansy, Jan A, MD, 800 mg at 09/03/23 1333   guaiFENesin (MUCINEX) 12 hr tablet 600 mg, 600 mg, Oral, BID, Chipper Herb, Ping T, MD, 600 mg at 09/03/23 1003   insulin aspart (novoLOG) injection 0-15 Units, 0-15 Units, Subcutaneous, TID WC, Mikey College T, MD, 2 Units at 09/03/23 1339   insulin aspart (novoLOG) injection 0-5 Units, 0-5 Units, Subcutaneous, QHS, Zhang, Ping T, MD, 2 Units at 08/30/23  2232   ipratropium (ATROVENT) nebulizer solution 0.5 mg, 0.5 mg, Nebulization, Q6H, Esaw Grandchild A, DO, 0.5 mg at 09/03/23 1359   levalbuterol (XOPENEX) nebulizer solution 0.63 mg, 0.63 mg, Nebulization, Once, Esaw Grandchild A, DO   levalbuterol (XOPENEX) nebulizer solution 0.63 mg, 0.63 mg, Nebulization, Q6H, Esaw Grandchild A, DO, 0.63 mg at 09/03/23 1359   magnesium sulfate IVPB 4 g  100 mL, 4 g, Intravenous, Once, Djan, Scarlette Calico, MD, Last Rate: 50 mL/hr at 09/03/23 1431, 4 g at 09/03/23 1431   melatonin tablet 2.5 mg, 2.5 mg, Oral, QHS PRN, Mansy, Jan A, MD, 2.5 mg at 09/02/23 2114   [START ON 09/04/2023] methylPREDNISolone sodium succinate (SOLU-MEDROL) 40 mg/mL injection 40 mg, 40 mg, Intravenous, Q24H, Shimshon Narula, MD   metoprolol succinate (TOPROL-XL) 24 hr tablet 50 mg, 50 mg, Oral, Daily, Djan, Prince T, MD, 50 mg at 09/03/23 1003   metoprolol tartrate (LOPRESSOR) injection 5 mg, 5 mg, Intravenous, Q4H PRN, Esaw Grandchild A, DO, 5 mg at 09/01/23 1103   ondansetron (ZOFRAN) tablet 4 mg, 4 mg, Oral, Q6H PRN **OR** ondansetron (ZOFRAN) injection 4 mg, 4 mg, Intravenous, Q6H PRN, Mikey College T, MD   pantoprazole (PROTONIX) EC tablet 40 mg, 40 mg, Oral, Daily, Mikey College T, MD, 40 mg at 09/03/23 1003   roflumilast (DALIRESP) tablet 500 mcg, 500 mcg, Oral, Daily, Mikey College T, MD, 500 mcg at 09/03/23 1007   simvastatin (ZOCOR) tablet 40 mg, 40 mg, Oral, Daily, Chipper Herb, Ping T, MD, 40 mg at 09/03/23 1002    ALLERGIES   Bee venom, Linezolid, and Azithromycin     REVIEW OF SYSTEMS    Review of Systems:  Gen:  Denies  fever, sweats, chills weigh loss  HEENT: Denies blurred vision, double vision, ear pain, eye pain, hearing loss, nose bleeds, sore throat Cardiac:  No dizziness, chest pain or heaviness, chest tightness,edema Resp:   reports dyspnea chronically  Gi: Denies swallowing difficulty, stomach pain, nausea or vomiting, diarrhea, constipation, bowel incontinence Gu:   Denies bladder incontinence, burning urine Ext:   Denies Joint pain, stiffness or swelling Skin: Denies  skin rash, easy bruising or bleeding or hives Endoc:  Denies polyuria, polydipsia , polyphagia or weight change Psych:   Denies depression, insomnia or hallucinations   Other:  All other systems negative   VS: BP (!) 152/93 (BP Location: Right Arm)   Pulse 98   Temp 98.1 F (36.7 C)   Resp 15   Ht 5\' 5"  (1.651 m)   Wt 74.4 kg   SpO2 94%   BMI 27.29 kg/m      PHYSICAL EXAM    GENERAL:NAD, no fevers, chills, no weakness no fatigue HEAD: Normocephalic, atraumatic.  EYES: Pupils equal, round, reactive to light. Extraocular muscles intact. No scleral icterus.  MOUTH: Moist mucosal membrane. Dentition intact. No abscess noted.  EAR, NOSE, THROAT: Clear without exudates. No external lesions.  NECK: Supple. No thyromegaly. No nodules. No JVD.  PULMONARY: decreased breath sounds with mild rhonchi worse at bases bilaterally.  CARDIOVASCULAR: S1 and S2. Regular rate and rhythm. No murmurs, rubs, or gallops. No edema. Pedal pulses 2+ bilaterally.  GASTROINTESTINAL: Soft, nontender, nondistended. No masses. Positive bowel sounds. No hepatosplenomegaly.  MUSCULOSKELETAL: No swelling, clubbing, or edema. Range of motion full in all extremities.  NEUROLOGIC: Cranial nerves II through XII are intact. No gross focal neurological deficits. Sensation intact. Reflexes intact.  SKIN: No ulceration, lesions, rashes, or cyanosis. Skin warm and dry. Turgor intact.  PSYCHIATRIC: Mood, affect within normal limits. The patient is awake, alert and oriented x 3. Insight, judgment intact.       IMAGING    Narrative & Impression  CLINICAL DATA:  Difficulty breathing since last night with hypoxia. Possible pulmonary embolism. History of lung cancer.   EXAM: CT ANGIOGRAPHY CHEST WITH CONTRAST   TECHNIQUE: Multidetector CT imaging of the chest was performed  using the standard protocol during  bolus administration of intravenous contrast. Multiplanar CT image reconstructions and MIPs were obtained to evaluate the vascular anatomy.   RADIATION DOSE REDUCTION: This exam was performed according to the departmental dose-optimization program which includes automated exposure control, adjustment of the mA and/or kV according to patient size and/or use of iterative reconstruction technique.   CONTRAST:  75mL OMNIPAQUE IOHEXOL 350 MG/ML SOLN   COMPARISON:  08/26/2023, 01/24/2023   FINDINGS: Cardiovascular: Heart is normal size. Calcified plaque over the left main, left anterior descending and lateral circumflex coronary arteries. Mild calcification of the aortic root. Thoracic aorta is normal in caliber. There is mild calcified plaque over the descending thoracic aorta. Pulmonary arterial system is well opacified without evidence of emboli. Remaining vascular structures are unremarkable.   Mediastinum/Nodes: Evidence mediastinal and bilateral hilar adenopathy. Precarinal lymph node measuring 1.5 cm and subcarinal lymph node measuring 0.7 cm. A couple right hilar lymph nodes measure 1.5 cm. These findings have progressed slightly compared to the prior exam. Remaining mediastinal structures are unremarkable.   Lungs/Pleura: The lungs are adequately inflated demonstrate moderate centrilobular emphysematous disease. Stable sub pleural irregular masslike opacity measuring 1.6 x 1.9 cm thought to represent post treatment change of the region patient's original left lung cancer. Interval development of patchy peripheral nodular airspace process predominately within the right lung and only minimally in the left lung suggesting acute infection. No significant effusion. Airways are unremarkable.   Upper Abdomen: Calcified plaque of the abdominal aorta. No acute findings. Several bilateral renal cysts unchanged with the larger measuring 2.8 cm of the left midpole. No follow-up imaging  is recommended.   Musculoskeletal: Stable mild compression fracture over the upper thoracic spine. Old sternal fracture.   Review of the MIP images confirms the above findings.   IMPRESSION: 1. No evidence of pulmonary embolism. 2. Interval development of patchy peripheral nodular airspace process predominately within the right lung and only minimally in the left lung suggesting acute infection. 3. Stable sub pleural irregular masslike opacity measuring 1.6 x 1.9 cm thought to represent post treatment change in the region patient's original left lung cancer. 4. Mediastinal and bilateral hilar adenopathy slightly progressed compared to the prior exam and may be reactive to patient's suspected pulmonary infection as described above. 5. Aortic atherosclerosis. Atherosclerotic coronary artery disease. 6. Stable bilateral renal cysts.   Aortic Atherosclerosis (ICD10-I70.0) and Emphysema (ICD10-J43.9).     Electronically Signed   By: Elberta Fortis M.D.   On: 08/30/2023 13:04   ASSESSMENT/PLAN   Acute on chronic hypoxemic respiratory failure    - Acute hypoxemic respiratory failure - present on admission  - COVID19 NEGATIVE  - supplemental O2 during my evaluation -4l/MIN -Respiratory viral panel-ORDERED -legionella ab-ORDERED -strep pneumoniae ur AG-ORDERED -sputum resp cultures- ORDERED -reviewed pertinent imaging with patient today - ESR -PT/OT for d/c planning  -please encourage patient to use incentive spirometer few times each hour while hospitalized.      Advanced COPD with chronic hypoxemia   - continue supplemental O2 with goal SpO2 >88%-92%   - agree with solumedrol - please wean as able   - agree with Daliresp 500 per home dose   - agre with xopenex and atrovent q6h  -agree with breo and incruse ellipta      Left lung cancer     - chronic stable with repeat Imaging not worrisome for recurrence     Thank you for allowing me to participate in the care  of this  patient.   Patient/Family are satisfied with care plan and all questions have been answered.    Provider disclosure: Patient with at least one acute or chronic illness or injury that poses a threat to life or bodily function and is being managed actively during this encounter.  All of the below services have been performed independently by signing provider:  review of prior documentation from internal and or external health records.  Review of previous and current lab results.  Interview and comprehensive assessment during patient visit today. Review of current and previous chest radiographs/CT scans. Discussion of management and test interpretation with health care team and patient/family.   This document was prepared using Dragon voice recognition software and may include unintentional dictation errors.     Vida Rigger, M.D.  Division of Pulmonary & Critical Care Medicine

## 2023-09-03 NOTE — Progress Notes (Signed)
Progress Note   Patient: Christian Fields ZOX:[id number] DOB: 12/30/1955 DOA: 08/30/2023     4 DOS: the patient was seen and examined on 09/03/2023   Subjective: Patient seen and examined at bedside this morning Still requiring 4 L of intranasal oxygen Found to have low magnesium of 1.3 and potassium of 3.3 which is being repleted  Brief hospital course: HPI on admission 08/30/23:  "Christian Fields is a 66 y.o. male with medical history significant of COPD Gold stage IV, chronic hypoxic respiratory failure on 4 L continuously, HTN, HLD, IIDM, OSA not on CPAP, PVD on aspirin Plavix chronic pain syndrome on Suboxone non-small cell lung carcinoma status post treatment, COPD stage II, chronic HFpEF, diabetic neuropathy, presented with worsening of wheezing cough shortness of breath and fever.   Patient reported that he ran out of 1 of his nebulizing treatment on Wednesday and started to have wheezing and cough next day became worse yesterday, started to have subjective fever and chills and he tried to turn up oxygen to 6 L this morning to compensate but did right little help.  Also discussed the case with daughter over the phone and daughter reported the patient did run out of one of the breathing medications but she does know specifically which 1 recently and started to have trouble breathing since Wednesday when he was seen by the visiting nurse yesterday. ED Course: BiPAP 100% on 60% of oxygen, temperature 101 and borderline tachycardia borderline hypoxic.  WBC 40, with bandemia, hemoglobin 9.9, ABG 7.2 8/56/75, creatinine 1.2 bicarb 24.  Lactic acid 1.6   CT angiogram negative for PE but multifocal pneumonia predominantly on the right mid lobe and right lower lobe.  Patient was started on cefepime and doxycycline and IV bolus x 1."     Pt admitted to stepdown unit requiring acute BiPAP. Further hospital course and management as outlined below.     Assessment and Plan: * Sepsis (HCC) Evidenced by  tachycardia, leukocytosis, new onset of fever, source of infection is multifocal pneumonia mainly on the right mid and lower lobe --s/p fluid resuscitation Continue current antibiotics Monitor culture results   Acute on chronic respiratory failure with hypoxia (HCC) Due to multifocal pneumonia with underlying COPD chronically on 4 l/min O2 with Acute COPD Exacerbation 9/23 - improved, on baseline 4 L/min O2 --Consult Pulmonology - appreciate input --Supplement O2, wean as tolerated --Target spO2 > 88--92% --Continue IV Rocephin, Doxycycline (Itching w/ Zithromax given in ED) -- Continue steroid therapy --Changed Duonebs & albuterol to Ipratropium and Xopenex on account of tachycardia -- Continue ICS and LABA --Weaned off bipap - stable to transfer to floor --Pulmonary hygiene with IS, flutter --Follow blood and sputum cultures --Mucinex & other supportive care per orders   COPD with acute exacerbation (HCC) .  Continue above management  Hypomagnesemia and hypokalemia Magnesium of 1.3 and potassium 3.3 Continue repletion and monitoring   Chronic heart failure with preserved ejection fraction (HFpEF) (HCC) Acute on chronic HFpEF - unclear if present on admission or due to sepsis IV fluids.  On admission, pt appeared volume depleted on admission, given IV fluids for sepsis.  Had increased tachycardic, normal lactic acid, improved O2 needs, but elevated BNP and chest tightness on 9/23 --s/p IV Lasix 20 mg x 3 doses -- Continue to monitor BMP to decide on need for additional diuresis   PVD (peripheral vascular disease) (HCC) On ASA, Plavix   Chronic pain syndrome Home regimen resumed - Suboxone TID, gabapentin 800 mg  PO QID No indications for escalating pain medications - avoid doing so.   Multifocal pneumonia .  Continue above antibiotics   History of osteomyelitis Recent hx of LEFT FOOT OSTEO s/p partial calcaneal amputation & debridement by podiatry 07/11/23. Seen by Dr.  Ether Griffins 9/17 - note reviewed --Non-weight-bearing on LLE --Follow up with Dr. Ether Griffins as scheduled --WOC consulted for wound care - see orders --no longer on antibiotics for osteo prior to admission, now abx for PNA --Monitor closely   Insulin dependent type 2 diabetes mellitus (HCC) Family reported pt not taking Farxiga Sliding scale Novolog     Physical Exam:   General exam: awake, alert, no acute distress, chronically ill appearing HEENT: moist mucus membranes, hearing grossly normal, nasal cannula in place  Respiratory system: on 4 L/min HFNC O2 (baseline), tachypneic, normal respiratory effort at rest, no accessory muscle use, severely diminished breath sounds with intermittent wheezes Cardiovascular system: normal S1/S2, tachycardic, regular rhythm, no murmur heard, no peripheral edema, +JVP.   Gastrointestinal system: abdomen non-tender, non-distended Central nervous system: A&O x3. no gross focal neurologic deficits, normal speech Extremities: moves all, no edema, normal tone Skin: dry, intact, no rashes seen on visualized skin Psychiatry: normal mood, congruent affect, judgment and insight appear intact     Data Reviewed: I have reviewed patient's lab results as shown below      Latest Ref Rng & Units 09/03/2023    5:20 AM 09/02/2023    7:42 AM 09/01/2023    5:37 AM  CBC  WBC 4.0 - 10.5 K/uL 9.0  13.6  28.0   Hemoglobin 13.0 - 17.0 g/dL 8.8  8.4  7.9   Hematocrit 39.0 - 52.0 % 28.3  27.9  26.3   Platelets 150 - 400 K/uL 326  301  261        Latest Ref Rng & Units 09/03/2023    5:20 AM 09/01/2023    5:37 AM 08/31/2023    3:34 AM  BMP  Glucose 70 - 99 mg/dL 409  811  914   BUN 8 - 23 mg/dL 29  25  22    Creatinine 0.61 - 1.24 mg/dL 7.82  9.56  2.13   Sodium 135 - 145 mmol/L 139  138  138   Potassium 3.5 - 5.1 mmol/L 3.3  4.1  4.8   Chloride 98 - 111 mmol/L 102  110  109   CO2 22 - 32 mmol/L 28  22  23    Calcium 8.9 - 10.3 mg/dL 7.4  7.7  7.9      Family  Communication: None present. Pt able to update.   Disposition: Status is: Inpatient Remains inpatient appropriate because: severity of illness, ongoing significant symptoms, persistent tachycardia, needs further clinical improvement    Planned Discharge Destination: Home      Vitals:   09/03/23 0817 09/03/23 1136 09/03/23 1359 09/03/23 1556  BP: (!) 171/101 (!) 152/93  (!) 143/81  Pulse: (!) 107 98  87  Resp: 16 15  16   Temp: 98.5 F (36.9 C) 98.1 F (36.7 C)  97.9 F (36.6 C)  TempSrc:      SpO2: 96% 96% 94% 95%  Weight:      Height:       Author: Loyce Dys, MD 09/03/2023 5:32 PM  For on call review [email or web address].

## 2023-09-03 NOTE — TOC Initial Note (Addendum)
Transition of Care [facility]) - Initial/Assessment Note    Patient Details  Name: Christian Fields MRN: [id number] Date of Birth: 08/04/1956  Transition of Care [facility]) CM/SW Contact:    Margarito Liner, LCSW Phone Number: 09/03/2023, 2:15 PM  Clinical Narrative:  Readmission prevention screen complete. CSW met with patient. No supports at bedside. CSW introduced role and explained that discharge planning would be discussed. PCP is no longer Cheri Kearns, MD. CSW offered to make an appt at a local PCP office but he said his daughter is working on getting him another provider. Pharmacy is [facility]. No issues obtaining medications. Patient lives home with his son. He is active with [facility]. CSW called agency who stated they had "transferred" him because he was in the hospital. CSW asked if they could pick him back up at discharge and liaison stated that should be fine. He was active with RN, PT, OT. Will need to fax home health orders to [phone]. He has 3 walkers, knee scooter, shower seat, and BSC at home. Patient confirmed having home oxygen through [employer]. He is typically on 4 L. [employer] liaison is confirming his orders. No further concerns. CSW encouraged patient to contact CSW as needed. CSW will continue to follow patient for support and facilitate return home at discharge. Daughter will transport him home at discharge.          2:44 pm: Per MD, likely discharge tomorrow. CSW faxed home health orders to [facility].      Expected Discharge Plan: Home w Home Health Services Barriers to Discharge: Continued Medical Work up   Patient Goals and CMS Choice     Choice offered to / list presented to : NA      Expected Discharge Plan and Services     Post Acute Care Choice: Resumption of Svcs/PTA Provider Living arrangements for the past 2 months: Single Family Home                           HH Arranged: RN, PT, OT [facility] Agency: [facility] Date [facility] Agency Contacted:  09/03/23   Representative spoke with at [facility] Agency: Lucendia Herrlich  Prior Living Arrangements/Services Living arrangements for the past 2 months: Single Family Home Lives with:: Adult Children Patient language and need for interpreter reviewed:: Yes Do you feel safe going back to the place where you live?: Yes      Need for Family Participation in Patient Care: Yes (Comment) Care giver support system in place?: Yes (comment) Current home services: DME, Home OT, Home PT, Home RN Criminal Activity/Legal Involvement Pertinent to Current Situation/Hospitalization: No - Comment as needed  Activities of Daily Living      Permission Sought/Granted Permission sought to share information with : Facility Industrial/product designer granted to share information with : Yes, Verbal Permission Granted     Permission granted to share info w AGENCY: [facility]        Emotional Assessment Appearance:: Appears stated age Attitude/Demeanor/Rapport: Engaged, Gracious Affect (typically observed): Accepting, Appropriate, Calm, Pleasant Orientation: : Oriented to Self, Oriented to Place, Oriented to Situation, Oriented to  Time Alcohol / Substance Use: Not Applicable Psych Involvement: No (comment)  Admission diagnosis:  Hypoxia [R09.02] COPD exacerbation (HCC) [J44.1] Sepsis (HCC) [A41.9] Community acquired pneumonia, unspecified laterality [J18.9] Patient Active Problem List   Diagnosis Date Noted   Multifocal pneumonia 08/30/2023   Acute osteomyelitis of left calcaneus (HCC) 07/11/2023  Diabetic infection of left foot (HCC) 07/11/2023   History of osteomyelitis 07/10/2023   Cellulitis and abscess of toe of left foot 07/03/2023   PVD (peripheral vascular disease) (HCC) 07/03/2023   Cellulitis of left lower extremity 07/02/2023   Diabetic foot ulcer (HCC) 07/02/2023   HLD (hyperlipidemia) 07/02/2023   Depression with anxiety 07/02/2023   Elevated lactic acid level 07/02/2023   Abnormal  LFTs 07/02/2023   COPD (chronic obstructive pulmonary disease) (HCC) 07/02/2023   Diarrhea 07/02/2023   History of ankle fusion (Right) 12/31/2021   Chronic anticoagulation (Plavix) 12/31/2021   Long term prescription benzodiazepine use (Klonopin) 12/31/2021   DDD (degenerative disc disease), cervical 12/31/2021   Pharmacologic therapy 10/31/2021   Disorder of skeletal system 10/31/2021   Problems influencing health status 10/31/2021   Aspiration pneumonia of right lower lobe due to gastric secretions (HCC)    Sepsis (HCC) 08/05/2021   Gout 08/05/2021   Depression 08/05/2021   Chronic heart failure with preserved ejection fraction (HFpEF) (HCC) 08/05/2021   HCAP (healthcare-associated pneumonia) 08/05/2021   Pain of left lower extremity 03/25/2021   Acute on chronic respiratory failure with hypoxia (HCC) 03/08/2021   Septic shock (HCC) 02/26/2021   Polysubstance (excluding opioids) dependence (HCC) 01/30/2021   Acute metabolic encephalopathy 01/29/2021   COPD with acute exacerbation (HCC) 01/29/2021   Iron deficiency anemia 11/27/2020   Severe sepsis (HCC)    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   Left leg swelling 04/28/2020   Confusion    Acute respiratory failure with hypoxia (HCC) 03/26/2020   Pneumonia of both lower lobes due to infectious organism 03/25/2020   Acute renal failure superimposed on stage 2 chronic kidney disease (HCC) 01/13/2020   Cellulitis of left leg 01/12/2020   COVID-19 virus detected 01/12/2020   Diabetic ulcer of left midfoot associated with type 2 diabetes mellitus, with fat layer exposed (HCC) 01/12/2020   Panlobular emphysema (HCC) 01/12/2020   Arthritis 10/29/2019   Hemorrhoids 10/29/2019   Senile nuclear sclerosis, bilateral 02/15/2019   Cellulitis of left upper limb 02/11/2019   AAA (abdominal aortic aneurysm) without rupture 01/15/2019   Localized swelling, mass and lump, upper limb 12/24/2018   Pain in femur 12/24/2018   TIA (transient  ischemic attack) 11/18/2018   Cortical age-related cataract of both eyes 11/02/2018   Fracture of multiple ribs 04/20/2018   Overweight (BMI 25.0-29.9) 04/20/2018   Hypertension 07/08/2017   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/08/2017   Hyperlipidemia 07/08/2017   Tobacco use disorder 07/08/2017   Atherosclerosis of native arteries of extremity with intermittent claudication (HCC) 07/08/2017   SOB (shortness of breath) 06/19/2017   Acute on chronic respiratory failure with hypoxia and hypercapnia (HCC) 12/15/2016   CAP (community acquired pneumonia) 12/15/2016   Chronic pain syndrome 12/15/2016   Chronically on opiate therapy 12/15/2016   History of kidney stones 12/15/2016   Polypharmacy 12/15/2016   Somnolence 12/15/2016   Foot ulcer (HCC) 01/19/2016   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) 11/29/2015   Other psychoactive substance use, unspecified with withdrawal, uncomplicated (HCC) 11/29/2015   Type 2 diabetes mellitus with foot ulcer (CODE) (HCC) 11/27/2015   Chronic obstructive pulmonary disease, unspecified (HCC) 01/18/2013   Depressive disorder 01/18/2013   Hereditary and idiopathic peripheral neuropathy 01/18/2013   Sleep apnea 01/18/2013   Low back pain 11/23/2010   Acute gouty arthropathy 07/10/2010   Problem related to lifestyle 10/29/2004   PCP:  Alease Medina, MD Pharmacy:   [facility] [place], [place] -  [address] [place] [address] Phone: [phone] Fax: [phone]  [facility] [address] [place] [address] Phone: [phone] Fax: [phone]     Social Determinants of Health (SDOH) Social History: SDOH Screenings   Food Insecurity: No Food Insecurity (07/10/2023)  Housing: Low Risk  (07/10/2023)  Transportation Needs: No Transportation Needs (07/10/2023)  Utilities: Not At Risk (07/10/2023)  Financial Resource Strain: Low Risk  (04/19/2023)    Received from [facility], [facility]  Tobacco Use: Medium Risk (08/30/2023)   SDOH Interventions:     Readmission Risk Interventions    09/03/2023    2:13 PM 07/12/2023   10:17 AM 08/03/2021    9:29 AM  Readmission Risk Prevention Plan  Transportation Screening Complete Complete Complete  PCP or Specialist Appt within 3-5 Days  Complete   HRI or Home Care Consult  Complete   Social Work Consult for Recovery Care Planning/Counseling  Complete   Palliative Care Screening  Not Applicable   Medication Review Oceanographer) Complete Complete Complete  PCP or Specialist appointment within 3-5 days of discharge Complete  Complete  HRI or Home Care Consult Complete  Complete  SW Recovery Care/Counseling Consult Complete  Complete  Palliative Care Screening Not Applicable  Not Applicable  Skilled Nursing Facility Not Applicable  Complete

## 2023-09-03 NOTE — Consult Note (Signed)
PHARMACY CONSULT NOTE - ELECTROLYTES  Pharmacy Consult for Electrolyte Monitoring and Replacement   Recent Labs: Height: 5\' 5"  (165.1 cm) Weight: 74.4 kg (164 lb 0.4 oz) IBW/kg (Calculated) : 61.5 Estimated Creatinine Clearance: 66 mL/min (by C-G formula based on SCr of 1.01 mg/dL). Potassium (mmol/L)  Date Value  09/03/2023 3.3 (L)  05/17/2014 3.5   Magnesium (mg/dL)  Date Value  25/36/6440 1.3 (L)   Calcium (mg/dL)  Date Value  34/74/2595 7.4 (L)   Calcium, Total (mg/dL)  Date Value  63/87/5643 9.3   Albumin (g/dL)  Date Value  32/95/1884 3.3 (L)  09/24/2013 3.6   Phosphorus (mg/dL)  Date Value  16/60/6301 5.6 (H)   Sodium (mmol/L)  Date Value  09/03/2023 139  03/31/2014 136    Assessment  Christian Fields is a 67 y.o. male presenting with increased SOB and fever. PMH significant for COPD Gold stage IV, chronic hypoxic respiratory failure on 4 L continuously, HTN, HLD, IIDM, OSA not on CPAP, PVD on aspirin Plavix chronic pain syndrome on Suboxone non-small cell lung carcinoma status post treatment, COPD stage II, chronic HFpEF, and diabetic neuropathy. Pharmacy has been consulted to monitor and replace electrolytes.  Diet: Heart healthy MIVF: IV lock  Goal of Therapy: Electrolytes WNL  Plan:  K 3.3, Mg 1.3 both required replacement. Will order Kcl po 40 mEq x 1 dose and MgSulfate 4gm IV x 1 Check BMP, Mg, Phos with AM labs  Thank you for allowing pharmacy to be a part of this patient's care.  Jarita Raval Rodriguez-Guzman PharmD, BCPS 09/03/2023 11:31 AM

## 2023-09-04 ENCOUNTER — Other Ambulatory Visit: Payer: Self-pay

## 2023-09-04 ENCOUNTER — Other Ambulatory Visit (HOSPITAL_COMMUNITY): Payer: Self-pay

## 2023-09-04 ENCOUNTER — Encounter: Payer: Self-pay | Admitting: Oncology

## 2023-09-04 DIAGNOSIS — R0902 Hypoxemia: Secondary | ICD-10-CM | POA: Diagnosis not present

## 2023-09-04 DIAGNOSIS — J441 Chronic obstructive pulmonary disease with (acute) exacerbation: Secondary | ICD-10-CM | POA: Diagnosis not present

## 2023-09-04 DIAGNOSIS — J189 Pneumonia, unspecified organism: Secondary | ICD-10-CM | POA: Diagnosis not present

## 2023-09-04 LAB — BASIC METABOLIC PANEL
Anion gap: 11 (ref 5–15)
BUN: 32 mg/dL — ABNORMAL HIGH (ref 8–23)
CO2: 28 mmol/L (ref 22–32)
Calcium: 7.4 mg/dL — ABNORMAL LOW (ref 8.9–10.3)
Chloride: 100 mmol/L (ref 98–111)
Creatinine, Ser: 0.92 mg/dL (ref 0.61–1.24)
GFR, Estimated: >60 mL/min (ref >60)
Glucose, Bld: 151 mg/dL — ABNORMAL HIGH (ref 70–99)
Potassium: 3.5 mmol/L (ref 3.5–5.1)
Sodium: 139 mmol/L (ref 135–145)

## 2023-09-04 LAB — CBC WITH DIFFERENTIAL/PLATELET
Abs Immature Granulocytes: 0.3 10*3/uL — ABNORMAL HIGH (ref 0.00–0.07)
Basophils Absolute: 0.1 10*3/uL (ref 0.0–0.1)
Basophils Relative: 0 %
Eosinophils Absolute: 0 10*3/uL (ref 0.0–0.5)
Eosinophils Relative: 0 %
HCT: 31.1 % — ABNORMAL LOW (ref 39.0–52.0)
Hemoglobin: 9.6 g/dL — ABNORMAL LOW (ref 13.0–17.0)
Immature Granulocytes: 2 %
Lymphocytes Relative: 16 %
Lymphs Abs: 2.1 10*3/uL (ref 0.7–4.0)
MCH: 29.4 pg (ref 26.0–34.0)
MCHC: 30.9 g/dL (ref 30.0–36.0)
MCV: 95.1 fL (ref 80.0–100.0)
Monocytes Absolute: 0.9 10*3/uL (ref 0.1–1.0)
Monocytes Relative: 7 %
Neutro Abs: 9.9 10*3/uL — ABNORMAL HIGH (ref 1.7–7.7)
Neutrophils Relative %: 75 %
Platelets: 365 10*3/uL (ref 150–400)
RBC: 3.27 MIL/uL — ABNORMAL LOW (ref 4.22–5.81)
RDW: 15.7 % — ABNORMAL HIGH (ref 11.5–15.5)
WBC: 13.3 10*3/uL — ABNORMAL HIGH (ref 4.0–10.5)
nRBC: 0.5 % — ABNORMAL HIGH (ref 0.0–0.2)

## 2023-09-04 LAB — CULTURE, BLOOD (SINGLE)
Culture: NO GROWTH
Culture: NO GROWTH
Special Requests: ADEQUATE
Special Requests: ADEQUATE

## 2023-09-04 LAB — CULTURE, RESPIRATORY W GRAM STAIN
Culture: NORMAL
Gram Stain: NONE SEEN

## 2023-09-04 LAB — MAGNESIUM: Magnesium: 1.9 mg/dL (ref 1.7–2.4)

## 2023-09-04 LAB — GLUCOSE, CAPILLARY
Glucose-Capillary: 313 mg/dL — ABNORMAL HIGH (ref 70–99)
Glucose-Capillary: 221 mg/dL — ABNORMAL HIGH (ref 70–99)

## 2023-09-04 LAB — C-REACTIVE PROTEIN: CRP: 2.3 mg/dL — ABNORMAL HIGH (ref <1.0)

## 2023-09-04 LAB — PHOSPHORUS: Phosphorus: 2.1 mg/dL — ABNORMAL LOW (ref 2.5–4.6)

## 2023-09-04 MED ORDER — BISACODYL 5 MG PO TBEC
5.0000 mg | DELAYED_RELEASE_TABLET | Freq: Every day | ORAL | 0 refills | Status: DC | PRN
  Filled 2023-09-04: qty 25, 25d supply, fill #0

## 2023-09-04 MED ORDER — LEVALBUTEROL HCL 0.63 MG/3ML IN NEBU
0.6300 mg | INHALATION_SOLUTION | Freq: Four times a day (QID) | RESPIRATORY_TRACT | 12 refills | Status: DC | PRN
Start: 2023-09-04 — End: 2023-09-04
  Filled 2023-09-04: qty 3, 1d supply, fill #0

## 2023-09-04 MED ORDER — PREDNISONE 20 MG PO TABS
20.0000 mg | ORAL_TABLET | Freq: Every day | ORAL | 0 refills | Status: DC
  Filled 2023-09-04: qty 5, 5d supply, fill #0

## 2023-09-04 MED ORDER — PREDNISONE 20 MG PO TABS: 20.0000 mg | ORAL_TABLET | Freq: Every day | ORAL | Status: DC

## 2023-09-04 MED ORDER — LEVALBUTEROL HCL 0.63 MG/3ML IN NEBU: 0.6300 mg | INHALATION_SOLUTION | Freq: Four times a day (QID) | RESPIRATORY_TRACT | 12 refills | Status: DC | PRN

## 2023-09-04 MED ORDER — AMOXICILLIN-POT CLAVULANATE 875-125 MG PO TABS
1.0000 | ORAL_TABLET | Freq: Two times a day (BID) | ORAL | 0 refills | Status: DC
  Filled 2023-09-04: qty 5, 3d supply, fill #0

## 2023-09-04 MED ORDER — K PHOS MONO-SOD PHOS DI & MONO 155-852-130 MG PO TABS
500.0000 mg | ORAL_TABLET | Freq: Once | ORAL | Status: AC
  Administered 2023-09-04: 500 mg via ORAL
  Filled 2023-09-04: qty 2

## 2023-09-04 NOTE — Progress Notes (Signed)
[facility] HF Stewardship  PCP: Ziglar, Eli Phillips, MD  PCP-Cardiologist: None  HPI: Christian Fields is a 67 y.o. male with COPD Gold stage IV, chronic hypoxic respiratory failure on 4 L continuously, HTN, HLD, IIDM, OSA not on CPAP, PVD on aspirin Plavix chronic pain syndrome on Suboxone non-small cell lung carcinoma status post treatment, COPD stage II, chronic HFpEF, diabetic neuropathy who presented with worsening wheezing and shortness of breath with fever. Diagnosed with sepsis due to multifocal pneumonia. Lactate and troponin normal on admission. BNP initially mildly elevated at 198.6, but increased to 898.1 after fluid resuscitation. TTE in 2022 showed LVEF of 55-60% with G2DD and normal RV.  Pertinent Lab Values: Creatinine  Date Value Ref Range Status  03/31/2014 1.05 0.60 - 1.30 mg/dL Final   Creatinine, Ser  Date Value Ref Range Status  09/04/2023 0.92 0.61 - 1.24 mg/dL Final   BUN  Date Value Ref Range Status  09/04/2023 32 (H) 8 - 23 mg/dL Final  16/09/9603 13 7 - 18 mg/dL Final   Potassium  Date Value Ref Range Status  09/04/2023 3.5 3.5 - 5.1 mmol/L Final  05/17/2014 3.5 3.5 - 5.1 mmol/L Final   Sodium  Date Value Ref Range Status  09/04/2023 139 135 - 145 mmol/L Final  03/31/2014 136 136 - 145 mmol/L Final   B Natriuretic Peptide  Date Value Ref Range Status  09/01/2023 898.1 (H) 0.0 - 100.0 pg/mL Final    Comment:    Performed at [facility], [address] [place], [place] [address]   Magnesium  Date Value Ref Range Status  09/04/2023 1.9 1.7 - 2.4 mg/dL Final    Comment:    Performed at [facility], [address] [place], [place] [address]   Hemoglobin A1C  Date Value Ref Range Status  09/25/2013 6.3 4.2 - 6.3 % Final    Comment:    The American Diabetes Association recommends that a primary goal of therapy should be <7% and that physicians should reevaluate the treatment regimen in patients with HbA1c values consistently >8%.     Hgb A1c MFr Bld  Date Value Ref Range Status  07/10/2023 5.6 4.8 - 5.6 % Final    Comment:    (NOTE) Pre diabetes:          5.7%-6.4%  Diabetes:              >6.4%  Glycemic control for   <7.0% adults with diabetes    TSH  Date Value Ref Range Status  09/01/2023 0.855 0.350 - 4.500 uIU/mL Final    Comment:    Performed by a 3rd Generation assay with a functional sensitivity of <=0.01 uIU/mL. Performed at [facility], [address] [place], [place] [address]    LDH  Date Value Ref Range Status  10/27/2020 136 98 - 192 U/L Final    Comment:    Performed at [facility], [address] [place], [place] [address]    Vital Signs: Temp:  [97.9 F (36.6 C)-98.6 F (37 C)] 98.3 F (36.8 C) (09/26 0929) Pulse Rate:  [85-104] 104 (09/26 0929) Cardiac Rhythm: Normal sinus rhythm (09/26 0703) Resp:  [16-20] 17 (09/26 0929) BP: (129-165)/(81-99) 152/99 (09/26 0929) SpO2:  [89 %-96 %] 95 % (09/26 0929)   Intake/Output Summary (Last 24 hours) at 09/04/2023 1141 Last data filed at 09/04/2023 1100 Gross per 24 hour  Intake 1200 ml  Output 2550 ml  Net -1350 ml  Current Inpatient Medications:  -Farxiga 10 mg daily  Prior to admission HF Medications:  -None  Assessment: 1. Diastolic heart failure (LVEF 55-60%), due to NICM. NYHA class II symptoms.  -Reports feeling much improved. SOB is at baseline. -BP is elevated and potassium is low. Would benefit from spironolactone for HFpEF (TOPCAT Trial)  Plan: 1) Medication changes recommended at this time: -Consider adding spironolactone 25 mg daily  2) Patient assistance: -N/a  3) Education: - Patient has been educated on current HF medications and potential additions to HF medication regimen - Patient verbalizes understanding that over the next few months, these medication doses may change and more medications may be added to optimize HF regimen - Patient has been educated on basic disease state  pathophysiology and goals of therapy  Medication Assistance / Insurance Benefits Check:  Does the patient have prescription insurance?    Outpatient Pharmacy:  Prior to admission outpatient pharmacy: [facility] Is the patient willing to utilize a [facility] pharmacy at discharge?: Yes  Thank you for involving pharmacy in this patient's care.  Enos Fling, PharmD, BCPS Phone - [phone] Clinical Pharmacist 09/04/2023 11:41 AM

## 2023-09-04 NOTE — Plan of Care (Signed)
A&Ox4, x1 assist to bathroom, adequate o2 sats on 3L Violet. Pt expresses dissatisfaction with current wound care protocol at the hospital. Orders currently are "Apply moist gauze packing to left heel wound Q day, then cover with 4X4s and kerlex". Pt states that at home he has a wound spray & iodoform packing strips. Pt expresses concern that it is not being taken care of properly. Will report to AM. PRN Tylenol given overnight for a mild headache.    Problem: Activity: Goal: Ability to tolerate increased activity will improve Outcome: Progressing   Problem: Health Behavior/Discharge Planning: Goal: Ability to identify and utilize available resources and services will improve Outcome: Progressing Goal: Ability to manage health-related needs will improve Outcome: Progressing   Problem: Education: Goal: Knowledge of General Education information will improve Description: Including pain rating scale, medication(s)/side effects and non-pharmacologic comfort measures Outcome: Progressing   Problem: Health Behavior/Discharge Planning: Goal: Ability to manage health-related needs will improve Outcome: Progressing   Problem: Pain Managment: Goal: General experience of comfort will improve Outcome: Progressing   Problem: Safety: Goal: Ability to remain free from injury will improve Outcome: Progressing

## 2023-09-04 NOTE — Care Management Important Message (Signed)
Important Message  Patient Details  Name: Christian Fields MRN: [id number] Date of Birth: September 02, 1956   Important Message Given:  Yes - Medicare IM  Patient asleep upon time of visit, no family in room.  Copy of Medicare IM left on bedside tray for reference.   Johnell Comings 09/04/2023, 1:08 PM

## 2023-09-04 NOTE — Progress Notes (Signed)
OT Cancellation Note  Patient Details Name: Christian Fields MRN: [id number] DOB: [birth year]   Cancelled Treatment:    Reason Eval/Treat Not Completed: Patient declined, no reason specified (Pt declining participation in OT this date; stating he is trying to go home today and can perform ADLs IND. OT attempts to encourge participation. Will re-attempt this afternoon as able. MD and RN entering room)  Victorino Dike L. Viola Placeres, OTR/L  09/04/23, 12:52 PM

## 2023-09-04 NOTE — Consult Note (Signed)
PHARMACY CONSULT NOTE - ELECTROLYTES  Pharmacy Consult for Electrolyte Monitoring and Replacement   Recent Labs: Height: 5\' 5"  (165.1 cm) Weight: 74.4 kg (164 lb 0.4 oz) IBW/kg (Calculated) : 61.5 Estimated Creatinine Clearance: 72.5 mL/min (by C-G formula based on SCr of 0.92 mg/dL). Potassium (mmol/L)  Date Value  09/04/2023 3.5  05/17/2014 3.5   Magnesium (mg/dL)  Date Value  16/09/9603 1.9   Calcium (mg/dL)  Date Value  54/08/8118 7.4 (L)   Calcium, Total (mg/dL)  Date Value  14/78/2956 9.3   Albumin (g/dL)  Date Value  21/30/8657 3.3 (L)  09/24/2013 3.6   Phosphorus (mg/dL)  Date Value  84/69/6295 2.1 (L)   Sodium (mmol/L)  Date Value  09/04/2023 139  03/31/2014 136    Assessment  Christian Fields is a 67 y.o. male presenting with increased SOB and fever. PMH significant for COPD Gold stage IV, chronic hypoxic respiratory failure on 4 L continuously, HTN, HLD, IIDM, OSA not on CPAP, PVD on aspirin Plavix chronic pain syndrome on Suboxone non-small cell lung carcinoma status post treatment, COPD stage II, chronic HFpEF, and diabetic neuropathy. Pharmacy has been consulted to monitor and replace electrolytes.  Diet: Heart healthy MIVF: IV lock  Goal of Therapy: Electrolytes WNL  Plan:  K 3.5 recovered to low end of normal range and Phos below normal values. Will order  Kphos 500mg  po x 1 dose today. Check BMP, Mg with AM labs per MD orders.  Thank you for allowing pharmacy to be a part of this patient's care.  Christian Fields PharmD, BCPS 09/04/2023 7:34 AM

## 2023-09-04 NOTE — Progress Notes (Signed)
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    NWBing LLE    PT Comments  Pt is received in bed, he is agreeable to PT session with encouragement. Pt performs bed mobility mod I, transfers sup A, and amb CGA secondary to Pt very impulsive. Pt able to amb with L knee scooter approx 75 ft with no difficulties noted with balance or AD management. Pt's SpO2 assessed pre-mobility of 94% on 3L [place] and post-mobility of 89% on 3L [place] and improving with increased rest-at end of session SpO2 of 93% on 3L [place]. Pt requires frequent cuing and reminders for adherence of LLE NWB precautions due to impulsivity and decreased awareness. Overall, Pt able to demonstrate decreased mobility assist and increased activity tolerance making good progress towards PT goals. Pt would benefit from cont skilled PT to address above deficits and promote optimal return to PLOF.   If plan is discharge home, recommend the following: A little help with walking and/or transfers;A little help with bathing/dressing/bathroom;Assist for transportation;Help with stairs or ramp for entrance   Can travel by private vehicle        Equipment Recommendations  BSC/3in1    Recommendations for Other Services       Precautions / Restrictions Precautions Precautions: Fall Restrictions Weight Bearing Restrictions: Yes LLE Weight Bearing: Non weight bearing Other Position/Activity Restrictions: L heel  sore     Mobility  Bed Mobility Overal bed mobility: Modified Independent             General bed mobility comments: Impulsive. Gets OOB without physical assist, immediately attempts to stand up on BLE, cues to redirect and maintain NWB precautions    Transfers Overall transfer level: Needs assistance Equipment used: None Transfers: Sit to/from Stand Sit to Stand: Supervision           General transfer comment: Pt impulsive with STS and uses bed rails to facilitate mobility; requires frequent VC to adhere to precautions    Ambulation/Gait Ambulation/Gait assistance: Contact guard assist Gait Distance (Feet): 75 Feet Assistive device: Knee scooter Gait Pattern/deviations: WFL(Within Functional Limits) Gait velocity: WNL     General Gait Details: amb with L knee scooter approx 75 ft CGA for safety due to impulsivity   Stairs             Wheelchair Mobility     Tilt Bed    Modified Rankin (Stroke Patients Only)       Balance Overall balance assessment: Needs assistance Sitting-balance support: Feet supported Sitting balance-Leahy Scale: Normal Sitting balance - Comments: able to maintain seated EOB balance during functional activities   Standing balance support: No upper extremity supported, During functional activity Standing balance-Leahy Scale: Good Standing balance comment: able to maintain static standing balance with use of knee scooter with no difficulties noted  Cognition Arousal: Alert Behavior During Therapy: Impulsive, WFL for tasks assessed/performed Overall Cognitive Status: No family/caregiver present to determine baseline cognitive functioning Area of Impairment: Safety/judgement                         Safety/Judgement: Decreased awareness of safety, Decreased awareness of deficits     General Comments: AO x4; Pt displays impulsivity during therapy and requires frequent reminders  to slow down and precautions        Exercises Other Exercises Other Exercises: Edu on NWB precautions and overall safety awareness    General Comments General comments (skin integrity, edema, etc.): SpO2 pre mobility 94% on 3L [place], SpO2 post mobility 89% 3L [place] and improving with rest      Pertinent Vitals/Pain Pain Assessment Pain Assessment: 0-10 Pain Score: 2  Pain Location: L Foot Pain Descriptors / Indicators: Aching Pain Intervention(s): Monitored during session, Repositioned    Home Living                          Prior Function            PT Goals (current goals can now be found in the care plan section) Acute Rehab PT Goals Patient Stated Goal: get to feeling better and back home PT Goal Formulation: With patient Time For Goal Achievement: 09/17/23 Potential to Achieve Goals: Good Progress towards PT goals: Progressing toward goals    Frequency    Min 1X/week      PT Plan      Co-evaluation              AM-PAC PT "6 Clicks" Mobility   Outcome Measure  Help needed turning from your back to your side while in a flat bed without using bedrails?: None Help needed moving from lying on your back to sitting on the side of a flat bed without using bedrails?: None Help needed moving to and from a bed to a chair (including a wheelchair)?: None Help needed standing up from a chair using your arms (e.g., wheelchair or bedside chair)?: None Help needed to walk in hospital room?: A Little Help needed climbing 3-5 steps with a railing? : A Lot 6 Click Score: 21    End of Session Equipment Utilized During Treatment: Gait belt Activity Tolerance: Patient tolerated treatment well Patient left: in chair;with call bell/phone within reach;with chair alarm set Nurse Communication: Mobility status PT Visit Diagnosis: Unsteadiness on feet (R26.81);Other abnormalities of gait and mobility (R26.89);Muscle weakness (generalized) (M62.81)     Time:  1308-6578 PT Time Calculation (min) (ACUTE ONLY): 18 min  Charges:    $Gait Training: 8-22 mins PT General Charges $$ ACUTE PT VISIT: 1 Visit                     Elmon Else, SPT    Jaque Dacy 09/04/2023, 1:00 PM

## 2023-09-04 NOTE — Progress Notes (Signed)
Patient received AVS instruction to include new medications, wound care, and appointment. Patient's personal belongs returned to them and daughter. Meds arrived from pharmacy, Daughter brought in home O2 to transport patient home. Patient Discharged.

## 2023-09-04 NOTE — TOC Transition Note (Signed)
Transition of Care [facility]) - CM/SW Discharge Note   Patient Details  Name: DASHEEM BRAFF MRN: [id number] Date of Birth: 05-May-1956  Transition of Care [facility]) CM/SW Contact:  Darolyn Rua, LCSW Phone Number: 09/04/2023, 2:08 PM   Clinical Narrative:     Patient to discharge home today with [facility], orders were faxed 9/25, no dme needs patient is back to baseline o2 needs through [employer] (4L at home, currently on 3L). Family to transport at dc, no other needs identified.   Final next level of care: Home w Home Health Services Barriers to Discharge: No Barriers Identified   Patient Goals and CMS Choice CMS Medicare.gov Compare Post Acute Care list provided to:: Patient Choice offered to / list presented to : NA  Discharge Placement                         Discharge Plan and Services Additional resources added to the After Visit Summary for       Post Acute Care Choice: Resumption of Svcs/PTA Provider                    HH Arranged: RN, PT, OT [facility] Agency: [facility] Date [facility] Agency Contacted: 09/03/23   Representative spoke with at [facility] Agency: Lucendia Herrlich  Social Determinants of Health (SDOH) Interventions SDOH Screenings   Food Insecurity: No Food Insecurity (07/10/2023)  Housing: Low Risk  (07/10/2023)  Transportation Needs: No Transportation Needs (07/10/2023)  Utilities: Not At Risk (07/10/2023)  Financial Resource Strain: Low Risk  (04/19/2023)   Received from [employer]  Tobacco Use: Medium Risk (08/30/2023)     Readmission Risk Interventions    09/03/2023    2:13 PM 07/12/2023   10:17 AM 08/03/2021    9:29 AM  Readmission Risk Prevention Plan  Transportation Screening Complete Complete Complete  PCP or Specialist Appt within 3-5 Days  Complete   HRI or Home Care Consult  Complete   Social Work Consult for Recovery Care Planning/Counseling  Complete   Palliative Care Screening  Not Applicable   Medication Review [employer]) Complete  Complete Complete  PCP or Specialist appointment within 3-5 days of discharge Complete  Complete  HRI or Home Care Consult Complete  Complete  SW Recovery Care/Counseling Consult Complete  Complete  Palliative Care Screening Not Applicable  Not Applicable  Skilled Nursing Facility Not Applicable  Complete

## 2023-09-04 NOTE — TOC Benefit Eligibility Note (Signed)
Pharmacy Patient Advocate Encounter  Insurance verification completed.    The patient is insured through  [employer]    Ran test claim for [employer]. Currently a quantity of 60 is a 30 day supply and the co-pay is $0.00 .  Ran test claim for [employer]. Currently a quantity of 30 is a 30 day supply and the co-pay is $0.00 .   This test claim was processed through [facility]- copay amounts may vary at other pharmacies due to pharmacy/plan contracts, or as the patient moves through the different stages of their insurance plan.

## 2023-09-04 NOTE — Inpatient Diabetes Management (Signed)
Inpatient Diabetes Program Recommendations  AACE/ADA: New Consensus Statement on Inpatient Glycemic Control (2015)  Target Ranges:  Prepandial:   less than 140 mg/dL      Peak postprandial:   less than 180 mg/dL (1-2 hours)      Critically ill patients:  140 - 180 mg/dL   Lab Results  Component Value Date   GLUCAP 313 (H) 09/04/2023   HGBA1C 5.6 07/10/2023    Review of Glycemic Control  Latest Reference Range & Units 09/03/23 08:19 09/03/23 11:37 09/03/23 15:57 09/03/23 20:27 09/04/23 09:27  Glucose-Capillary 70 - 99 mg/dL 578 (H) 469 (H) 629 (H) 171 (H) 313 (H)   Diabetes history: DM 2 Outpatient Diabetes medications: Farxiga 10 mg Daily (not taking), Metformin 1000 mg bid Current orders for Inpatient glycemic control:  Farxiga 10 mg Daily Novolog 0-15 units tid + hs  Solumedrol 40 mg Q24 hours A1c 5.6% on 8/1  Inpatient Diabetes Program Recommendations:    Glucose 313 this am, unsure if this is postprandial  Watch for now possibly consider Novolog 2 units tid meal coverage if eating >50% of meals while on steroids if postprandial glucose trends increase.  Thanks, Christena Deem RN, MSN, BC-ADM Inpatient Diabetes Coordinator Team Pager [phone] (8a-5p)

## 2023-09-04 NOTE — Discharge Summary (Signed)
Physician Discharge Summary   Patient: Christian Fields MRN: [id number] DOB: [birth year]  Admit date:     08/30/2023  Discharge date: 09/04/23  Discharge Physician: Loyce Dys   PCP: Alease Medina, MD   Recommendations at discharge:  Follow-up with your primary care physician and your pulmonologist  Discharge Diagnoses: Sepsis (HCC) Acute on chronic respiratory failure with hypoxia (HCC) Due to multifocal pneumonia with underlying COPD chronically on 4 l/min O2 with Acute COPD Exacerbation COPD with acute exacerbation (HCC) Hypomagnesemia and hypokalemia Chronic heart failure with preserved ejection fraction (HFpEF) (HCC) Acute on chronic HFpEF  PVD (peripheral vascular disease) (HCC) Chronic pain syndrome Multifocal pneumonia History of osteomyelitis Insulin dependent type 2 diabetes mellitus [facility])    Hospital Course: Christian Fields is a 67 y.o. male with medical history significant of COPD Gold stage IV, chronic hypoxic respiratory failure on 4 L continuously, HTN, HLD, IIDM, OSA not on CPAP, PVD on aspirin Plavix chronic pain syndrome on Suboxone non-small cell lung carcinoma status post treatment, COPD stage II, chronic HFpEF, diabetic neuropathy, presented with worsening of wheezing cough shortness of breath and fever.  Was initially admitted to the stepdown on BiPAP which was later weaned off.  Patient was seen by pulmonologist and managed with nebulization as well as steroid which was later changed to oral therapy.  Patient has now been cleared for discharge today and to follow-up with outpatient physicians.    Consultants: Pulmonary Procedures performed: None Disposition: Home health Diet recommendation:  Cardiac diet DISCHARGE MEDICATION: Allergies as of 09/04/2023       Reactions   Bee Venom Anaphylaxis   Linezolid Other (See Comments)   Thrombocytopenia, hyperlactatemia   Azithromycin Itching, Rash        Medication List     TAKE these medications     acetaminophen 325 MG tablet Commonly known as: TYLENOL Take 650 mg by mouth every 6 (six) hours as needed.   albuterol 108 (90 Base) MCG/ACT inhaler Commonly known as: VENTOLIN HFA Inhale 2 puffs into the lungs every 4 (four) hours as needed for wheezing or shortness of breath. What changed: Another medication with the same name was removed. Continue taking this medication, and follow the directions you see here.   allopurinol 300 MG tablet Commonly known as: ZYLOPRIM Take 600 mg by mouth daily.   amoxicillin-clavulanate 875-125 MG tablet Commonly known as: AUGMENTIN Take 1 tablet by mouth every 12 (twelve) hours.   aspirin EC 81 MG tablet Take 1 tablet by mouth daily.   azelastine 0.1 % nasal spray Commonly known as: ASTELIN Place 2 sprays into both nostrils 2 (two) times daily.   bisacodyl 5 MG EC tablet Commonly known as: DULCOLAX Take 1 tablet (5 mg total) by mouth daily as needed for moderate constipation.   buprenorphine-naloxone 8-2 mg Subl SL tablet Commonly known as: SUBOXONE Place 1 tablet under the tongue in the morning, at noon, and at bedtime.   clopidogrel 75 MG tablet Commonly known as: PLAVIX Take 1 tablet (75 mg total) by mouth daily.   cyanocobalamin 1000 MCG/ML injection Commonly known as: VITAMIN B12 Inject 1 mL (1,000 mcg total) into the muscle every 30 (thirty) days.   DULoxetine 30 MG capsule Commonly known as: CYMBALTA Take 1 capsule by mouth daily.   ezetimibe 10 MG tablet Commonly known as: ZETIA Take 10 mg by mouth daily.   Farxiga 10 MG Tabs tablet Generic drug: dapagliflozin propanediol Take 10 mg by mouth daily.   fluticasone  50 MCG/ACT nasal spray Commonly known as: FLONASE Place 1 spray into both nostrils daily as needed for allergies.   gabapentin 800 MG tablet Commonly known as: NEURONTIN Take 800 mg by mouth 4 (four) times daily.   levalbuterol 0.63 MG/3ML nebulizer solution Commonly known as: XOPENEX Take 3 mLs  (0.63 mg total) by nebulization every 6 (six) hours as needed for wheezing or shortness of breath.   metFORMIN 1000 MG tablet Commonly known as: GLUCOPHAGE Take 1,000 mg by mouth 2 (two) times daily with a meal.   metoprolol succinate 50 MG 24 hr tablet Commonly known as: TOPROL-XL Take 50 mg by mouth daily.   ondansetron 4 MG disintegrating tablet Commonly known as: ZOFRAN-ODT Take 4 mg by mouth every 8 (eight) hours as needed for nausea.   OXYGEN Inhale 2 L into the lungs.   pantoprazole 40 MG tablet Commonly known as: PROTONIX Take 40 mg by mouth daily.   predniSONE 20 MG tablet Commonly known as: DELTASONE Take 1 tablet (20 mg total) by mouth daily with breakfast. Start taking on: September 05, 2023   roflumilast 500 MCG Tabs tablet Commonly known as: DALIRESP Take 500 mcg by mouth daily.   simvastatin 40 MG tablet Commonly known as: ZOCOR Take 1 tablet by mouth daily.   SYRINGE 3CC/25GX1" 25G X 1" 3 ML Misc 1 Syringe by Does not apply route every 30 (thirty) days.   Trelegy Ellipta 100-62.5-25 MCG/ACT Aepb Generic drug: Fluticasone-Umeclidin-Vilant Inhale into the lungs.        Follow-up Information     Ziglar, Eli Phillips, MD Follow up.   Specialty: Family Medicine Contact information: [address] [place] [address] [phone]         Mertie Moores, MD Follow up.   Specialty: Specialist Contact information: [address] [place] [address] [phone]                Discharge Exam: Filed Weights   08/30/23 1058 08/30/23 1435  Weight: 79.1 kg 74.4 kg   General exam: awake, alert, no acute distress, chronically ill appearing HEENT: moist mucus membranes, hearing grossly normal, nasal cannula in place  Respiratory system: on not in acute distress Cardiovascular system: normal S1/S2,  Gastrointestinal system: abdomen non-tender, non-distended Central nervous system: A&O x3. no gross focal neurologic deficits, normal  speech Extremities: moves all, no edema, normal tone Skin: dry, intact, no rashes seen on visualized skin Psychiatry: normal mood, congruent affect, judgment and insight appear intact    Condition at discharge: good   Discharge time spent:  41 minutes.  Signed: Loyce Dys, MD [facility] 09/04/2023

## 2023-09-08 NOTE — Progress Notes (Deleted)
Advanced Heart Failure Clinic Note   Referring Physician: recent admit PCP: Christian Fields, Christian Fields, Christian Fields Cardiologist: None   HPI:  [name] is a 68 y/o male with a history of  Admitted 08/30/23 due to worsening of wheezing cough shortness of breath and fever.  Was initially admitted to the stepdown on BiPAP which was later weaned off. CT angiogram negative for PE but multifocal pneumonia predominantly on the right mid lobe and right lower lobe. Patient was started on cefepime and doxycycline and IV bolus x 1. Pulmonology consult done. Weaned off bipap to his nasal cannula oxygen at 4L. Magnesium and potassium repleted. Given 3 doses of IV lasix after getting IVF. BNP initially mildly elevated at 198.6, but increased to 898.1 after fluid resuscitation. TTE in 2022 showed LVEF of 55-60% with G2DD and normal RV.   Echo 02/28/21: EF 55-60% with Grade II DD and mild RAE Echo 11/05/21: EF >55% with trivial TR Echo 03/21/22: EF >55% Echo 04/21/23: EF >55% with mild RAE  Chest CTA 05/28/23: 1. No findings of pulmonary embolic disease. With regards to the patient's hypoxemia, the most notable finding is confluent changes of centrilobular emphysema present fairly diffusely throughout the lungs.  2. A new small (15 mm) focus of consolidation in the middle lobe of the right lung is favored reflect infection given its rapid development over the past 6 weeks.  3. Similar approximately 2.3 cm pleural-based nodule in the upper lobe of left lung which correlates with the sequelae of radiation therapy performed in the management of presumed primary lung cancer (no known biopsy performed).  4. Similar nonspecific borderline prominent lymph nodes within the mediastinum and pulmonary hila  5. Similar prominent pulmonary trunk (3.9 cm) which suggests elevated pulmonary arterial pressures   He presents today for his initial visit with a chief complaint of    Review of Systems: [y] = yes, [ ]  = no   General: Weight gain  [ ] ; Weight loss [ ] ; Anorexia [ ] ; Fatigue [ ] ; Fever [ ] ; Chills [ ] ; Weakness [ ]   Cardiac: Chest pain/pressure [ ] ; Resting SOB [ ] ; Exertional SOB [ ] ; Orthopnea [ ] ; Pedal Edema [ ] ; Palpitations [ ] ; Syncope [ ] ; Presyncope [ ] ; Paroxysmal nocturnal dyspnea[ ]   Pulmonary: Cough [ ] ; Wheezing[ ] ; Hemoptysis[ ] ; Sputum [ ] ; Snoring [ ]   GI: Vomiting[ ] ; Dysphagia[ ] ; Melena[ ] ; Hematochezia [ ] ; Heartburn[ ] ; Abdominal pain [ ] ; Constipation [ ] ; Diarrhea [ ] ; BRBPR [ ]   GU: Hematuria[ ] ; Dysuria [ ] ; Nocturia[ ]   Vascular: Pain in legs with walking [ ] ; Pain in feet with lying flat [ ] ; Non-healing sores [ ] ; Stroke [ ] ; TIA [ ] ; Slurred speech [ ] ;  Neuro: Headaches[ ] ; Vertigo[ ] ; Seizures[ ] ; Paresthesias[ ] ;Blurred vision [ ] ; Diplopia [ ] ; Vision changes [ ]   Ortho/Skin: Arthritis [ ] ; Joint pain [ ] ; Muscle pain [ ] ; Joint swelling [ ] ; Back Pain [ ] ; Rash [ ]   Psych: Depression[ ] ; Anxiety[ ]   Heme: Bleeding problems [ ] ; Clotting disorders [ ] ; Anemia [ ]   Endocrine: Diabetes [ ] ; Thyroid dysfunction[ ]    Past Medical History:  Diagnosis Date   Anemia    Anxiety    Arthritis    Asthma    Cancer (HCC)    Basal Cell Skin Cancer   Chronic back pain    COPD (chronic obstructive pulmonary disease) (HCC)    Depression    Diabetes mellitus (HCC)  Dyspnea    GERD (gastroesophageal reflux disease)    Gout    Gout    Headache    History of blood clots    Left Leg--July 2018   History of kidney stones    Hyperlipidemia    Hyperlipidemia    Hypertension    Kidney stones    Neuropathy    On home oxygen therapy    2 L / M   Pneumonia 06/2017   Sleep apnea    Ulcer of foot (HCC)    Right    Current Outpatient Medications  Medication Sig Dispense Refill   acetaminophen (TYLENOL) 325 MG tablet Take 650 mg by mouth every 6 (six) hours as needed.     albuterol (VENTOLIN HFA) 108 (90 Base) MCG/ACT inhaler Inhale 2 puffs into the lungs every 4 (four) hours as needed for  wheezing or shortness of breath.     allopurinol (ZYLOPRIM) 300 MG tablet Take 600 mg by mouth daily.     amoxicillin-clavulanate (AUGMENTIN) 875-125 MG tablet Take 1 tablet by mouth every 12 (twelve) hours. 5 tablet 0   aspirin EC 81 MG tablet Take 1 tablet by mouth daily.     azelastine (ASTELIN) 0.1 % nasal spray Place 2 sprays into both nostrils 2 (two) times daily.     bisacodyl (DULCOLAX) 5 MG EC tablet Take 1 tablet (5 mg total) by mouth daily as needed for moderate constipation. 25 tablet 0   buprenorphine-naloxone (SUBOXONE) 8-2 mg SUBL SL tablet Place 1 tablet under the tongue in the morning, at noon, and at bedtime.     clopidogrel (PLAVIX) 75 MG tablet Take 1 tablet (75 mg total) by mouth daily. 30 tablet 1   cyanocobalamin (VITAMIN B12) 1000 MCG/ML injection Inject 1 mL (1,000 mcg total) into the muscle every 30 (thirty) days. 1 mL 11   dapagliflozin propanediol (FARXIGA) 10 MG TABS tablet Take 10 mg by mouth daily.     DULoxetine (CYMBALTA) 30 MG capsule Take 1 capsule by mouth daily.     ezetimibe (ZETIA) 10 MG tablet Take 10 mg by mouth daily.     fluticasone (FLONASE) 50 MCG/ACT nasal spray Place 1 spray into both nostrils daily as needed for allergies.     Fluticasone-Umeclidin-Vilant (TRELEGY ELLIPTA) 100-62.5-25 MCG/ACT AEPB Inhale into the lungs.     gabapentin (NEURONTIN) 800 MG tablet Take 800 mg by mouth 4 (four) times daily.     levalbuterol (XOPENEX) 0.63 MG/3ML nebulizer solution Take 3 mLs (0.63 mg total) by nebulization every 6 (six) hours as needed for wheezing or shortness of breath. 3 mL 12   metFORMIN (GLUCOPHAGE) 1000 MG tablet Take 1,000 mg by mouth 2 (two) times daily with a meal.     metoprolol succinate (TOPROL-XL) 50 MG 24 hr tablet Take 50 mg by mouth daily.     ondansetron (ZOFRAN-ODT) 4 MG disintegrating tablet Take 4 mg by mouth every 8 (eight) hours as needed for nausea.     OXYGEN Inhale 2 L into the lungs.     pantoprazole (PROTONIX) 40 MG tablet Take  40 mg by mouth daily.     predniSONE (DELTASONE) 20 MG tablet Take 1 tablet (20 mg total) by mouth daily with breakfast. 5 tablet 0   roflumilast (DALIRESP) 500 MCG TABS tablet Take 500 mcg by mouth daily.     simvastatin (ZOCOR) 40 MG tablet Take 1 tablet by mouth daily.     Syringe/Needle, Disp, (SYRINGE 3CC/25GX1") 25G X 1" 3 ML  MISC 1 Syringe by Does not apply route every 30 (thirty) days. 12 each 0   No current facility-administered medications for this visit.    Allergies  Allergen Reactions   Bee Venom Anaphylaxis   Linezolid Other (See Comments)    Thrombocytopenia, hyperlactatemia   Azithromycin Itching and Rash      Social History   Socioeconomic History   Marital status: Widowed    Spouse name: Not on file   Number of children: Not on file   Years of education: Not on file   Highest education level: Not on file  Occupational History   Not on file  Tobacco Use   Smoking status: Former    Current packs/day: 0.50    Average packs/day: 0.5 packs/day for 6.7 years (3.4 ttl pk-yrs)    Types: Cigarettes    Start date: 2018   Smokeless tobacco: Never  Vaping Use   Vaping status: Never Used  Substance and Sexual Activity   Alcohol use: Yes    Alcohol/week: 1.0 standard drink of alcohol    Types: 1 Cans of beer per week    Comment: occ   Drug use: No   Sexual activity: Not Currently  Other Topics Concern   Not on file  Social History Narrative   Not on file   Social Determinants of Health   Financial Resource Strain: Low Risk  (04/19/2023)   Received from [facility]   Overall Financial Resource Strain (CARDIA)    Difficulty of Paying Living Expenses: Not hard at all  Food Insecurity: No Food Insecurity (07/10/2023)   Hunger Vital Sign    Worried About Running Out of Food in the Last Year: Never true    Ran Out of Food in the Last Year: Never true  Transportation Needs: No Transportation Needs (07/10/2023)   PRAPARE - Therapist, art (Medical): No    Lack of Transportation (Non-Medical): No  Physical Activity: Not on file  Stress: Not on file  Social Connections: Not on file  Intimate Partner Violence: Not At Risk (07/10/2023)   Humiliation, Afraid, Rape, and Kick questionnaire    Fear of Current or Ex-Partner: No    Emotionally Abused: No    Physically Abused: No    Sexually Abused: No      Family History  Problem Relation Age of Onset   Hypertension Mother        PHYSICAL EXAM: General:  Well appearing. No respiratory difficulty HEENT: normal Neck: supple. no JVD. No lymphadenopathy or thyromegaly appreciated. Cor: PMI nondisplaced. Regular rate & rhythm. No rubs, gallops or murmurs. Lungs: clear Abdomen: soft, nontender, nondistended. No hepatosplenomegaly. No bruits or masses.  Extremities: no cyanosis, clubbing, rash, edema Neuro: alert & oriented x 3, cranial nerves grossly intact. moves all 4 extremities w/o difficulty. Affect pleasant.  ECG:   ASSESSMENT & PLAN:  1: Chronic heart failure with preserved ejection fraction- - suspect due to - NYHA class - euvolemic - weighing daily - Echo 02/28/21: EF 55-60% with Grade II DD and mild RAE - Echo 11/05/21: EF >55% with trivial TR - Echo 03/21/22: EF >55% - Echo 04/21/23: EF >55% with mild RAE - continue  - BNP  2: HTN- - BP - saw PCP - BMP  3: COPD- - saw pulmonology  4: IDDM- - A1c  5: PVD-  6: OSA- - does not wear CPAP   Delma Freeze, FNP 09/08/23

## 2023-09-09 ENCOUNTER — Encounter: Payer: 59 | Admitting: Family

## 2023-09-18 ENCOUNTER — Ambulatory Visit (INDEPENDENT_AMBULATORY_CARE_PROVIDER_SITE_OTHER): Payer: 59 | Admitting: Nurse Practitioner

## 2023-09-18 ENCOUNTER — Encounter (INDEPENDENT_AMBULATORY_CARE_PROVIDER_SITE_OTHER): Payer: Medicare HMO

## 2023-09-18 ENCOUNTER — Other Ambulatory Visit (INDEPENDENT_AMBULATORY_CARE_PROVIDER_SITE_OTHER): Payer: Medicare HMO

## 2023-10-04 ENCOUNTER — Other Ambulatory Visit: Payer: Self-pay

## 2023-10-04 ENCOUNTER — Emergency Department: Payer: 59

## 2023-10-04 ENCOUNTER — Inpatient Hospital Stay
Admission: EM | Admit: 2023-10-04 | Discharge: 2023-10-09 | DRG: 191 | Disposition: A | Discharge status: Skilled Nursing Facility | Payer: 59 | Source: Skilled Nursing Facility | Attending: Internal Medicine | Admitting: Internal Medicine

## 2023-10-04 DIAGNOSIS — J9621 Acute and chronic respiratory failure with hypoxia: Secondary | ICD-10-CM | POA: Diagnosis present

## 2023-10-04 DIAGNOSIS — M86172 Other acute osteomyelitis, left ankle and foot: Secondary | ICD-10-CM | POA: Diagnosis present

## 2023-10-04 DIAGNOSIS — Z9981 Dependence on supplemental oxygen: Secondary | ICD-10-CM | POA: Diagnosis not present

## 2023-10-04 DIAGNOSIS — Z87442 Personal history of urinary calculi: Secondary | ICD-10-CM

## 2023-10-04 DIAGNOSIS — R0602 Shortness of breath: Secondary | ICD-10-CM | POA: Diagnosis present

## 2023-10-04 DIAGNOSIS — N179 Acute kidney failure, unspecified: Secondary | ICD-10-CM | POA: Diagnosis present

## 2023-10-04 DIAGNOSIS — E11621 Type 2 diabetes mellitus with foot ulcer: Secondary | ICD-10-CM | POA: Diagnosis present

## 2023-10-04 DIAGNOSIS — F17210 Nicotine dependence, cigarettes, uncomplicated: Secondary | ICD-10-CM | POA: Diagnosis present

## 2023-10-04 DIAGNOSIS — I110 Hypertensive heart disease with heart failure: Secondary | ICD-10-CM | POA: Diagnosis present

## 2023-10-04 DIAGNOSIS — L97426 Non-pressure chronic ulcer of left heel and midfoot with bone involvement without evidence of necrosis: Secondary | ICD-10-CM | POA: Diagnosis present

## 2023-10-04 DIAGNOSIS — C3492 Malignant neoplasm of unspecified part of left bronchus or lung: Secondary | ICD-10-CM | POA: Diagnosis not present

## 2023-10-04 DIAGNOSIS — E1151 Type 2 diabetes mellitus with diabetic peripheral angiopathy without gangrene: Secondary | ICD-10-CM | POA: Diagnosis present

## 2023-10-04 DIAGNOSIS — I5032 Chronic diastolic (congestive) heart failure: Secondary | ICD-10-CM | POA: Diagnosis present

## 2023-10-04 DIAGNOSIS — R0603 Acute respiratory distress: Secondary | ICD-10-CM | POA: Diagnosis present

## 2023-10-04 DIAGNOSIS — G894 Chronic pain syndrome: Secondary | ICD-10-CM | POA: Diagnosis present

## 2023-10-04 DIAGNOSIS — E1142 Type 2 diabetes mellitus with diabetic polyneuropathy: Secondary | ICD-10-CM | POA: Diagnosis present

## 2023-10-04 DIAGNOSIS — M549 Dorsalgia, unspecified: Secondary | ICD-10-CM | POA: Diagnosis present

## 2023-10-04 DIAGNOSIS — Z888 Allergy status to other drugs, medicaments and biological substances status: Secondary | ICD-10-CM

## 2023-10-04 DIAGNOSIS — J9601 Acute respiratory failure with hypoxia: Secondary | ICD-10-CM

## 2023-10-04 DIAGNOSIS — J441 Chronic obstructive pulmonary disease with (acute) exacerbation: Principal | ICD-10-CM | POA: Diagnosis present

## 2023-10-04 DIAGNOSIS — Z881 Allergy status to other antibiotic agents status: Secondary | ICD-10-CM

## 2023-10-04 DIAGNOSIS — D72828 Other elevated white blood cell count: Secondary | ICD-10-CM | POA: Diagnosis not present

## 2023-10-04 DIAGNOSIS — F17200 Nicotine dependence, unspecified, uncomplicated: Secondary | ICD-10-CM | POA: Diagnosis not present

## 2023-10-04 DIAGNOSIS — Z7902 Long term (current) use of antithrombotics/antiplatelets: Secondary | ICD-10-CM

## 2023-10-04 DIAGNOSIS — F419 Anxiety disorder, unspecified: Secondary | ICD-10-CM | POA: Diagnosis present

## 2023-10-04 DIAGNOSIS — Z794 Long term (current) use of insulin: Secondary | ICD-10-CM | POA: Diagnosis not present

## 2023-10-04 DIAGNOSIS — Z8249 Family history of ischemic heart disease and other diseases of the circulatory system: Secondary | ICD-10-CM

## 2023-10-04 DIAGNOSIS — D72829 Elevated white blood cell count, unspecified: Secondary | ICD-10-CM | POA: Diagnosis not present

## 2023-10-04 DIAGNOSIS — M109 Gout, unspecified: Secondary | ICD-10-CM | POA: Diagnosis present

## 2023-10-04 DIAGNOSIS — Z7984 Long term (current) use of oral hypoglycemic drugs: Secondary | ICD-10-CM

## 2023-10-04 DIAGNOSIS — Z7951 Long term (current) use of inhaled steroids: Secondary | ICD-10-CM

## 2023-10-04 DIAGNOSIS — D638 Anemia in other chronic diseases classified elsewhere: Secondary | ICD-10-CM | POA: Diagnosis present

## 2023-10-04 DIAGNOSIS — M86672 Other chronic osteomyelitis, left ankle and foot: Secondary | ICD-10-CM | POA: Diagnosis present

## 2023-10-04 DIAGNOSIS — E1169 Type 2 diabetes mellitus with other specified complication: Secondary | ICD-10-CM | POA: Diagnosis present

## 2023-10-04 DIAGNOSIS — K219 Gastro-esophageal reflux disease without esophagitis: Secondary | ICD-10-CM | POA: Diagnosis present

## 2023-10-04 DIAGNOSIS — Z85118 Personal history of other malignant neoplasm of bronchus and lung: Secondary | ICD-10-CM

## 2023-10-04 DIAGNOSIS — E785 Hyperlipidemia, unspecified: Secondary | ICD-10-CM | POA: Diagnosis present

## 2023-10-04 DIAGNOSIS — Z1152 Encounter for screening for COVID-19: Secondary | ICD-10-CM | POA: Diagnosis not present

## 2023-10-04 DIAGNOSIS — Z7952 Long term (current) use of systemic steroids: Secondary | ICD-10-CM

## 2023-10-04 DIAGNOSIS — Z7982 Long term (current) use of aspirin: Secondary | ICD-10-CM

## 2023-10-04 DIAGNOSIS — J9611 Chronic respiratory failure with hypoxia: Secondary | ICD-10-CM | POA: Diagnosis present

## 2023-10-04 DIAGNOSIS — D649 Anemia, unspecified: Secondary | ICD-10-CM | POA: Diagnosis present

## 2023-10-04 DIAGNOSIS — T380X5A Adverse effect of glucocorticoids and synthetic analogues, initial encounter: Secondary | ICD-10-CM | POA: Diagnosis not present

## 2023-10-04 DIAGNOSIS — F32A Depression, unspecified: Secondary | ICD-10-CM | POA: Diagnosis present

## 2023-10-04 DIAGNOSIS — L97421 Non-pressure chronic ulcer of left heel and midfoot limited to breakdown of skin: Secondary | ICD-10-CM | POA: Diagnosis not present

## 2023-10-04 DIAGNOSIS — Z23 Encounter for immunization: Secondary | ICD-10-CM

## 2023-10-04 DIAGNOSIS — Z7189 Other specified counseling: Secondary | ICD-10-CM | POA: Diagnosis not present

## 2023-10-04 DIAGNOSIS — Z86718 Personal history of other venous thrombosis and embolism: Secondary | ICD-10-CM

## 2023-10-04 DIAGNOSIS — E119 Type 2 diabetes mellitus without complications: Secondary | ICD-10-CM

## 2023-10-04 DIAGNOSIS — Z85828 Personal history of other malignant neoplasm of skin: Secondary | ICD-10-CM

## 2023-10-04 DIAGNOSIS — Z79899 Other long term (current) drug therapy: Secondary | ICD-10-CM

## 2023-10-04 DIAGNOSIS — Z91030 Bee allergy status: Secondary | ICD-10-CM

## 2023-10-04 LAB — BLOOD GAS, VENOUS
Acid-Base Excess: 5.2 mmol/L — ABNORMAL HIGH (ref 0.0–2.0)
Bicarbonate: 32.9 mmol/L — ABNORMAL HIGH (ref 20.0–28.0)
O2 Saturation: 85.1 %
Patient temperature: 37
pCO2, Ven: 61 mm[Hg] — ABNORMAL HIGH (ref 44–60)
pH, Ven: 7.34 (ref 7.25–7.43)
pO2, Ven: 53 mm[Hg] — ABNORMAL HIGH (ref 32–45)

## 2023-10-04 LAB — ETHANOL: Alcohol, Ethyl (B): <10 mg/dL (ref <10)

## 2023-10-04 LAB — COMPREHENSIVE METABOLIC PANEL
ALT: 20 U/L (ref 0–44)
AST: 28 U/L (ref 15–41)
Albumin: 3.2 g/dL — ABNORMAL LOW (ref 3.5–5.0)
Alkaline Phosphatase: 95 U/L (ref 38–126)
Anion gap: 7 (ref 5–15)
BUN: 21 mg/dL (ref 8–23)
CO2: 30 mmol/L (ref 22–32)
Calcium: 9 mg/dL (ref 8.9–10.3)
Chloride: 97 mmol/L — ABNORMAL LOW (ref 98–111)
Creatinine, Ser: 1.29 mg/dL — ABNORMAL HIGH (ref 0.61–1.24)
GFR, Estimated: >60 mL/min (ref >60)
Glucose, Bld: 165 mg/dL — ABNORMAL HIGH (ref 70–99)
Potassium: 3.5 mmol/L (ref 3.5–5.1)
Sodium: 134 mmol/L — ABNORMAL LOW (ref 135–145)
Total Bilirubin: 0.5 mg/dL (ref 0.3–1.2)
Total Protein: 7.7 g/dL (ref 6.5–8.1)

## 2023-10-04 LAB — CBC WITH DIFFERENTIAL/PLATELET
Abs Immature Granulocytes: 0.08 10*3/uL — ABNORMAL HIGH (ref 0.00–0.07)
Basophils Absolute: 0.1 10*3/uL (ref 0.0–0.1)
Basophils Relative: 1 %
Eosinophils Absolute: 0.3 10*3/uL (ref 0.0–0.5)
Eosinophils Relative: 2 %
HCT: 33.2 % — ABNORMAL LOW (ref 39.0–52.0)
Hemoglobin: 10.1 g/dL — ABNORMAL LOW (ref 13.0–17.0)
Immature Granulocytes: 1 %
Lymphocytes Relative: 30 %
Lymphs Abs: 3.3 10*3/uL (ref 0.7–4.0)
MCH: 28.1 pg (ref 26.0–34.0)
MCHC: 30.4 g/dL (ref 30.0–36.0)
MCV: 92.2 fL (ref 80.0–100.0)
Monocytes Absolute: 0.9 10*3/uL (ref 0.1–1.0)
Monocytes Relative: 8 %
Neutro Abs: 6.5 10*3/uL (ref 1.7–7.7)
Neutrophils Relative %: 58 %
Platelets: 293 10*3/uL (ref 150–400)
RBC: 3.6 MIL/uL — ABNORMAL LOW (ref 4.22–5.81)
RDW: 15.2 % (ref 11.5–15.5)
WBC: 11.1 10*3/uL — ABNORMAL HIGH (ref 4.0–10.5)
nRBC: 0 % (ref 0.0–0.2)

## 2023-10-04 LAB — TROPONIN I (HIGH SENSITIVITY)
Troponin I (High Sensitivity): 13 ng/L (ref <18)
Troponin I (High Sensitivity): 14 ng/L (ref <18)

## 2023-10-04 LAB — PROCALCITONIN: Procalcitonin: <0.1 ng/mL

## 2023-10-04 LAB — BRAIN NATRIURETIC PEPTIDE: B Natriuretic Peptide: 46.1 pg/mL (ref 0.0–100.0)

## 2023-10-04 LAB — MAGNESIUM: Magnesium: 3.1 mg/dL — ABNORMAL HIGH (ref 1.7–2.4)

## 2023-10-04 LAB — LACTIC ACID, PLASMA: Lactic Acid, Venous: 1.9 mmol/L (ref 0.5–1.9)

## 2023-10-04 LAB — SARS CORONAVIRUS 2 BY RT PCR: SARS Coronavirus 2 by RT PCR: NEGATIVE

## 2023-10-04 MED ORDER — PREDNISONE 20 MG PO TABS
20.0000 mg | ORAL_TABLET | Freq: Every day | ORAL | Status: DC
  Administered 2023-10-05: 20 mg via ORAL
  Filled 2023-10-04: qty 1

## 2023-10-04 MED ORDER — IPRATROPIUM-ALBUTEROL 0.5-2.5 (3) MG/3ML IN SOLN
3.0000 mL | Freq: Four times a day (QID) | RESPIRATORY_TRACT | Status: DC
  Administered 2023-10-04 – 2023-10-05 (×2): 3 mL via RESPIRATORY_TRACT
  Filled 2023-10-04 (×2): qty 3

## 2023-10-04 MED ORDER — HEPARIN SODIUM (PORCINE) 5000 UNIT/ML IJ SOLN
5000.0000 [IU] | Freq: Two times a day (BID) | INTRAMUSCULAR | Status: DC
  Administered 2023-10-04 – 2023-10-05 (×2): 5000 [IU] via SUBCUTANEOUS
  Filled 2023-10-04 (×2): qty 1

## 2023-10-04 MED ORDER — HYDROCODONE-ACETAMINOPHEN 5-325 MG PO TABS
1.0000 | ORAL_TABLET | ORAL | Status: DC | PRN
  Administered 2023-10-05: 2 via ORAL
  Filled 2023-10-04: qty 2

## 2023-10-04 MED ORDER — GUAIFENESIN ER 600 MG PO TB12: 600.0000 mg | ORAL_TABLET | Freq: Two times a day (BID) | ORAL | Status: DC | PRN

## 2023-10-04 MED ORDER — ALBUTEROL SULFATE (2.5 MG/3ML) 0.083% IN NEBU: 2.5000 mg | INHALATION_SOLUTION | RESPIRATORY_TRACT | Status: DC | PRN

## 2023-10-04 MED ORDER — DULOXETINE HCL 30 MG PO CPEP
30.0000 mg | ORAL_CAPSULE | Freq: Every day | ORAL | Status: DC
  Administered 2023-10-05 – 2023-10-08 (×4): 30 mg via ORAL
  Filled 2023-10-04 (×4): qty 1

## 2023-10-04 MED ORDER — SODIUM CHLORIDE 0.9% FLUSH
3.0000 mL | Freq: Two times a day (BID) | INTRAVENOUS | Status: DC
  Administered 2023-10-04 – 2023-10-09 (×9): 3 mL via INTRAVENOUS

## 2023-10-04 MED ORDER — IOHEXOL 300 MG/ML  SOLN
75.0000 mL | Freq: Once | INTRAMUSCULAR | Status: AC | PRN
  Administered 2023-10-04: 75 mL via INTRAVENOUS

## 2023-10-04 MED ORDER — ROFLUMILAST 500 MCG PO TABS
500.0000 ug | ORAL_TABLET | Freq: Every day | ORAL | Status: DC
  Administered 2023-10-05 – 2023-10-09 (×5): 500 ug via ORAL
  Filled 2023-10-04 (×5): qty 1

## 2023-10-04 MED ORDER — CLOPIDOGREL BISULFATE 75 MG PO TABS
75.0000 mg | ORAL_TABLET | Freq: Every day | ORAL | Status: DC
  Administered 2023-10-05 – 2023-10-09 (×5): 75 mg via ORAL
  Filled 2023-10-04 (×5): qty 1

## 2023-10-04 MED ORDER — MORPHINE SULFATE (PF) 2 MG/ML IV SOLN: 2.0000 mg | INTRAVENOUS | Status: DC | PRN

## 2023-10-04 MED ORDER — THIAMINE HCL 100 MG/ML IJ SOLN
100.0000 mg | INTRAMUSCULAR | Status: DC
  Administered 2023-10-04: 100 mg via INTRAVENOUS
  Filled 2023-10-04: qty 2

## 2023-10-04 MED ORDER — METOPROLOL SUCCINATE ER 50 MG PO TB24
50.0000 mg | ORAL_TABLET | Freq: Every day | ORAL | Status: DC
  Administered 2023-10-05 – 2023-10-09 (×5): 50 mg via ORAL
  Filled 2023-10-04 (×5): qty 1

## 2023-10-04 MED ORDER — SODIUM CHLORIDE 0.9 % IV BOLUS
500.0000 mL | Freq: Once | INTRAVENOUS | Status: AC
Start: 2023-10-04 — End: 2023-10-04
  Administered 2023-10-04: 500 mL via INTRAVENOUS

## 2023-10-04 MED ORDER — NICOTINE 14 MG/24HR TD PT24
14.0000 mg | MEDICATED_PATCH | Freq: Every day | TRANSDERMAL | Status: DC
  Filled 2023-10-04 (×3): qty 1

## 2023-10-04 MED ORDER — IPRATROPIUM-ALBUTEROL 0.5-2.5 (3) MG/3ML IN SOLN
3.0000 mL | Freq: Once | RESPIRATORY_TRACT | Status: AC
  Administered 2023-10-04: 3 mL via RESPIRATORY_TRACT
  Filled 2023-10-04: qty 3

## 2023-10-04 MED ORDER — METHYLPREDNISOLONE SODIUM SUCC 125 MG IJ SOLR
120.0000 mg | Freq: Every day | INTRAMUSCULAR | Status: AC
  Administered 2023-10-04: 120 mg via INTRAVENOUS
  Filled 2023-10-04: qty 2

## 2023-10-04 MED ORDER — LACTATED RINGERS IV SOLN: INTRAVENOUS | Status: AC

## 2023-10-04 MED ORDER — BUPRENORPHINE HCL-NALOXONE HCL 8-2 MG SL SUBL
1.0000 | SUBLINGUAL_TABLET | Freq: Every day | SUBLINGUAL | Status: DC
  Administered 2023-10-05 (×2): 1 via SUBLINGUAL
  Filled 2023-10-04 (×2): qty 1

## 2023-10-04 MED ORDER — ASPIRIN 81 MG PO TBEC
81.0000 mg | DELAYED_RELEASE_TABLET | Freq: Every day | ORAL | Status: DC
  Administered 2023-10-04 – 2023-10-09 (×6): 81 mg via ORAL
  Filled 2023-10-04 (×6): qty 1

## 2023-10-04 MED ORDER — ACETAMINOPHEN 650 MG RE SUPP: 650.0000 mg | Freq: Four times a day (QID) | RECTAL | Status: DC | PRN

## 2023-10-04 MED ORDER — PREDNISONE 20 MG PO TABS
40.0000 mg | ORAL_TABLET | Freq: Every day | ORAL | Status: DC
  Filled 2023-10-04: qty 2

## 2023-10-04 MED ORDER — ACETAMINOPHEN 325 MG PO TABS: 650.0000 mg | ORAL_TABLET | Freq: Four times a day (QID) | ORAL | Status: DC | PRN

## 2023-10-04 MED ORDER — HYDRALAZINE HCL 20 MG/ML IJ SOLN
5.0000 mg | INTRAMUSCULAR | Status: DC | PRN
Start: 2023-10-04 — End: 2023-10-09

## 2023-10-04 NOTE — Assessment & Plan Note (Addendum)
Anemia since 2021. Suspect chronic . Currently stable at 10.1 we will follow cbc and monitor.  Chart shows GI note Jan 11,2024: EGD/Colonoscopy in the setting of Fe deficiency anemia and CRC screening. He has had overall reassuring colonoscopies in 2009 (1 polyp) and 2015 (0 polyps), he would not be due for 10 years. " Will defer to his Hem/Onc for additional evaluation of his anemia on discharge.

## 2023-10-04 NOTE — ED Provider Notes (Signed)
[facility] Provider Note    None    (approximate)   History   Shortness of Breath   HPI  Christian Fields is a 67 y.o. male  with PMHx COPD, HTN, HLD, chronic O2 requirement, here with cough, SOB. Pt reports that for the past 2 days he has had worsening cough and SOB. He just was d/c'ed from [facility], where he had been admitted for acute CHF and COPD. He was discharged to SNF. He has been at [facility]. Reports he felt "okay" for a day or so then this began. He was on ABX during his stay. No fevers. No CP.   Physical Exam   Triage Vital Signs: ED Triage Vitals  Encounter Vitals Group     BP      Systolic BP Percentile      Diastolic BP Percentile      Pulse      Resp      Temp      Temp src      SpO2      Weight      Height      Head Circumference      Peak Flow      Pain Score      Pain Loc      Pain Education      Exclude from Growth Chart     Most recent vital signs: Vitals:   10/04/23 1800 10/04/23 1830  BP: (!) 104/56 98/61  Pulse: 97 95  Resp: (!) 25 18  Temp:    SpO2: 97% 98%     General: Awake, no distress.  CV:  Good peripheral perfusion. RRR. Tachycardic. Resp:  Increased WOB with diffuse wheezes and diminished aeration. Tachypnea. Abd:  No distention. No tenderness. Other:  Trace edema.   ED Results / Procedures / Treatments   Labs (all labs ordered are listed, but only abnormal results are displayed) Labs Reviewed  CBC WITH DIFFERENTIAL/PLATELET - Abnormal; Notable for the following components:      Result Value   WBC 11.1 (*)    RBC 3.60 (*)    Hemoglobin 10.1 (*)    HCT 33.2 (*)    Abs Immature Granulocytes 0.08 (*)    All other components within normal limits  COMPREHENSIVE METABOLIC PANEL - Abnormal; Notable for the following components:   Sodium 134 (*)    Chloride 97 (*)    Glucose, Bld 165 (*)    Creatinine, Ser 1.29 (*)    Albumin 3.2 (*)    All other components within normal limits  MAGNESIUM  - Abnormal; Notable for the following components:   Magnesium 3.1 (*)    All other components within normal limits  SARS CORONAVIRUS 2 BY RT PCR  CULTURE, BLOOD (ROUTINE X 2)  CULTURE, BLOOD (ROUTINE X 2)  LACTIC ACID, PLASMA  BRAIN NATRIURETIC PEPTIDE  PROCALCITONIN     EKG Normal sinus rhythm, VR 88. PR 220, QRS 82, QTc 465. No acute St elevations.   RADIOLOGY CXR: R sided bronchitis   I also independently reviewed and agree with radiologist interpretations.   PROCEDURES:  Critical Care performed: Yes, see critical care procedure note(s)  .Critical Care  Performed by: Shaune Pollack, MD Authorized by: Shaune Pollack, MD   Critical care provider statement:    Critical care time (minutes):  30   Critical care time was exclusive of:  Separately billable procedures and treating other patients   Critical care was necessary to  treat or prevent imminent or life-threatening deterioration of the following conditions:  Cardiac failure, circulatory failure and respiratory failure   Critical care was time spent personally by me on the following activities:  Development of treatment plan with patient or surrogate, discussions with consultants, evaluation of patient's response to treatment, examination of patient, ordering and review of laboratory studies, ordering and review of radiographic studies, ordering and performing treatments and interventions, pulse oximetry, re-evaluation of patient's condition and review of old charts     MEDICATIONS ORDERED IN ED: Medications  ipratropium-albuterol (DUONEB) 0.5-2.5 (3) MG/3ML nebulizer solution 3 mL (3 mLs Nebulization Given 10/04/23 1651)  ipratropium-albuterol (DUONEB) 0.5-2.5 (3) MG/3ML nebulizer solution 3 mL (3 mLs Nebulization Given 10/04/23 1651)     IMPRESSION / MDM / ASSESSMENT AND PLAN / ED COURSE  I reviewed the triage vital signs and the nursing notes.                              Differential diagnosis includes, but  is not limited to, COPD exacerbation, PNA, PE, CHF, ACS, anemia  Patient's presentation is most consistent with acute presentation with potential threat to life or bodily function.  The patient is on the cardiac monitor to evaluate for evidence of arrhythmia and/or significant heart rate changes  67 yo M with extensive PMHx as above here with cough, SOB. Clinically, suspect COPD exacerbation with h/o same. CXR shows bronchitis. He has diffuse wheezing, improved with nebs and BIPAP initially. No drowsiness or signs of hypercapnea. Labs  show minimal leukocytosis, CMP with mild Cr elevation from baseline. LA normal. BNP normal. Procal normal, doubt PNA. COVID negative. Will admit for COPD exacerbation w/ acute on chronic resp failure.   FINAL CLINICAL IMPRESSION(S) / ED DIAGNOSES   Final diagnoses:  COPD exacerbation (HCC)     Rx / DC Orders   ED Discharge Orders     None        Note:  This document was prepared using Dragon voice recognition software and may include unintentional dictation errors.   Shaune Pollack, MD 10/04/23 [phone]

## 2023-10-04 NOTE — Assessment & Plan Note (Addendum)
SpO2: 92 % O2 Flow Rate (L/min): 4 L/min FiO2 (%): 44 % Pt is off bipap. Clinically stable no longer in distress.  Dyspnea is resolved. We will continue with COPD and antibiotic coverage for possible aspiration as etiology fro persistent and enlargement of RUL nodule.  Pulmonary consult for bronchoscopy as deemed appropriate while here of outpatient if pt improved.

## 2023-10-04 NOTE — Assessment & Plan Note (Addendum)
2/2 to COPD/ ? Malignancy . PET scan per pulmonary.

## 2023-10-04 NOTE — ED Triage Notes (Signed)
Pt arrives with c/o resp distress. Pt discharged to [facility] healthcare, staff noted that the pt's O2 sats were dropping despite the prescribed 6LO2 he was on. Fire reports room air sats in the low 80s, EMS notes little air movement. Improvement noted with meds and CPAP placement. MD at bedside, pt placed on BiPap. Pt A&O x4, respiratory distress noted with exertion.   2g mag, 125 solumedrol, CPAP, 2 duo neb treatments

## 2023-10-04 NOTE — Assessment & Plan Note (Addendum)
Cont solumedrol.  Prn albuterol t/t and duoneb. IS and flutter valve.  Antibiotics.

## 2023-10-04 NOTE — Assessment & Plan Note (Addendum)
Currently patient is euvolemic.  No JVD no edema no third heart sound. Patient continued on aspirin 81, heparin 5000, metoprolol succinate 50 mg daily. Strict I's and O's.  Daily weight. 2 d echo 2 years in 2022 shows: IMPRESSIONS 1. Left ventricular ejection fraction, by estimation, is 55 to 60% . Left ventricular ejection fraction by 3D volume is 60 % . The left ventricle has normal function. The left ventricle has no regional wall motion abnormalities. Left ventricular diastolic parameters are consistent with Grade II diastolic dysfunction ( pseudonormalization) . The average left ventricular global longitudinal strain is - 11. 5 % . The global longitudinal strain is abnormal. 2. Right ventricular systolic function is normal. The right ventricular size is normal. 3. Right atrial size was mildly dilated. 4. The mitral valve is normal in structure. No evidence of mitral valve regurgitation. 5. The aortic valve was not well visualized. Aortic valve regurgitation is not visualized. 6. Pulmonic valve regurgitation not assessed.

## 2023-10-04 NOTE — Assessment & Plan Note (Addendum)
Pt states he has not smoked since past 7 years.

## 2023-10-04 NOTE — Assessment & Plan Note (Addendum)
2/2 to Advanced copd exacerbation and hypoxemia.  -Cont COPD t/t protocol.  - reassess oxygen need upon discharge.

## 2023-10-04 NOTE — Assessment & Plan Note (Addendum)
Stable post therapeutic changes within the left upper lobe, consistent with history of treated lung cancer. No recurrence noted on ct scan.

## 2023-10-04 NOTE — Assessment & Plan Note (Addendum)
Glycemic protocol.  Carb consistent cardiac diet.  Marcelline Deist and metformin held until pt can get back to his baseline diet.

## 2023-10-04 NOTE — H&P (Signed)
History and Physical    Patient: Christian Fields:[id number] DOB: [birth year] DOA: 10/04/2023 DOS: the patient was seen and examined on 10/04/2023 PCP: Christian Medina, MD  Patient coming from: Home  Chief Complaint:  Chief Complaint  Patient presents with   Shortness of Breath    HPI: Christian Fields is a 67 y.o. male with medical history significant for COPD Gold stage IV, chronic hypoxic respiratory failure on 4 L continuously, HTN, HLD, IIDM, OSA not on CPAP, PVD on aspirin Plavix chronic pain syndrome on Suboxone non-small cell lung carcinoma status post treatment, COPD stage II, chronic HFpEF, diabetic neuropathy, presented with worsening of wheezing cough shortness of breath and respiratory distress on bipap received solumedrol with EMS. CTA chest done last month for similar presentation is neg for PE but did show post treatment changes.at bedside daughter is present and contributes to history. Pt is on 3 L and 88% and I increased back to 4L as on arrival. He just was d/c'ed from [facility], where he had been admitted for acute CHF and COPD. He was discharged to rehab facility.   In emergency room vitals trends shows: Vitals:   10/04/23 2113 10/04/23 2130 10/04/23 2230 10/04/23 2330  BP:  102/61 108/66 103/62  Pulse:  (!) 103 99 100  Temp: 98 F (36.7 C)     Resp:  19 19   Height:      Weight:      SpO2:  92% 93% 92%  TempSrc: Oral     BMI (Calculated):       Labs were notable for pCO2 of 61 on venous blood gas. Metabolic panel showing sodium of 134 chloride 97 glucose 165 AKI 1.29 magnesium of 3.1, normal lft.  BNP of 46.1 TNI of 13.  Lactic acid 1.9 procalcitonin less than 0.10. CBC shows white count of 11.1 hemoglobin of 10.1 normal platelets. Blood cultures were collected in the emergency room. CT imaging shows : IMPRESSION: 1. Progressive enlargement of the right upper lobe nodule seen on prior exam, now measuring 17 x 11 mm. While the patient has a history  of waxing and waning pulmonary nodularity presumed to be inflammatory or infectious, the persistence since the 08/30/2023 exam is somewhat concerning. PET-CT or interval 3 month follow-up chest CT are recommended for further evaluation. 2. Stable mediastinal and hilar adenopathy, nonspecific. This could also be re-evaluated at the time of follow-up imaging. 3. Stable post therapeutic changes within the left upper lobe, consistent with history of treated lung cancer. 4. Aortic Atherosclerosis (ICD10-I70.0) and Emphysema (ICD10-J43.9). 5. Stable enlarged pulmonary arteries consistent with pulmonary arterial hypertension.      In ed pt received: Medications  thiamine (VITAMIN B1) injection 100 mg (100 mg Intravenous Given 10/04/23 2249)  clopidogrel (PLAVIX) tablet 75 mg (75 mg Oral Not Given 10/04/23 2213)  predniSONE (DELTASONE) tablet 20 mg (has no administration in time range)  albuterol (PROVENTIL) (2.5 MG/3ML) 0.083% nebulizer solution 2.5 mg (has no administration in time range)  aspirin EC tablet 81 mg (81 mg Oral Given 10/04/23 2251)  buprenorphine-naloxone (SUBOXONE) 8-2 mg per SL tablet 1 tablet (1 tablet Sublingual Not Given 10/04/23 2212)  DULoxetine (CYMBALTA) DR capsule 30 mg (0 mg Oral Hold 10/04/23 2211)  metoprolol succinate (TOPROL-XL) 24 hr tablet 50 mg (50 mg Oral Not Given 10/04/23 2212)  roflumilast (DALIRESP) tablet 500 mcg (500 mcg Oral Not Given 10/04/23 2212)  methylPREDNISolone sodium succinate (SOLU-MEDROL) 125 mg/2 mL injection 120 mg (120 mg Intravenous Given  10/04/23 2250)    Followed by  predniSONE (DELTASONE) tablet 40 mg (has no administration in time range)  ipratropium-albuterol (DUONEB) 0.5-2.5 (3) MG/3ML nebulizer solution 3 mL (3 mLs Nebulization Given 10/04/23 2251)  sodium chloride flush (NS) 0.9 % injection 3 mL (3 mLs Intravenous Given 10/04/23 2251)  lactated ringers infusion ( Intravenous New Bag/Given 10/04/23 2304)  acetaminophen (TYLENOL)  tablet 650 mg (has no administration in time range)    Or  acetaminophen (TYLENOL) suppository 650 mg (has no administration in time range)  HYDROcodone-acetaminophen (NORCO/VICODIN) 5-325 MG per tablet 1-2 tablet (has no administration in time range)  morphine (PF) 2 MG/ML injection 2 mg (has no administration in time range)  guaiFENesin (MUCINEX) 12 hr tablet 600 mg (has no administration in time range)  nicotine (NICODERM CQ - dosed in mg/24 hours) patch 14 mg (14 mg Transdermal Patient Refused/Not Given 10/04/23 2251)  hydrALAZINE (APRESOLINE) injection 5 mg (has no administration in time range)  heparin injection 5,000 Units (5,000 Units Subcutaneous Given 10/04/23 2250)  ipratropium-albuterol (DUONEB) 0.5-2.5 (3) MG/3ML nebulizer solution 3 mL (3 mLs Nebulization Given 10/04/23 1651)  ipratropium-albuterol (DUONEB) 0.5-2.5 (3) MG/3ML nebulizer solution 3 mL (3 mLs Nebulization Given 10/04/23 1651)  sodium chloride 0.9 % bolus 500 mL (0 mLs Intravenous Stopped 10/04/23 2048)  iohexol (OMNIPAQUE) 300 MG/ML solution 75 mL (75 mLs Intravenous Contrast Given 10/04/23 2026)     Review of Systems  Respiratory:  Positive for cough, sputum production, shortness of breath and wheezing.    Past Medical History:  Diagnosis Date   Anemia    Anxiety    Arthritis    Asthma    Cancer (HCC)    Basal Cell Skin Cancer   Chronic back pain    COPD (chronic obstructive pulmonary disease) (HCC)    Depression    Diabetes mellitus (HCC)    Dyspnea    GERD (gastroesophageal reflux disease)    Gout    Gout    Headache    History of blood clots    Left Leg--July 2018   History of kidney stones    Hyperlipidemia    Hyperlipidemia    Hypertension    Kidney stones    Neuropathy    On home oxygen therapy    2 L / M   Pneumonia 06/2017   Sleep apnea    Ulcer of foot (HCC)    Right   Past Surgical History:  Procedure Laterality Date   APPENDECTOMY     DG FEET 2 VIEWS BILAT     IRRIGATION  AND DEBRIDEMENT FOOT Left 07/11/2023   Procedure: IRRIGATION AND DEBRIDEMENT FOOT;  Surgeon: Christian Fields, DPM;  Location: [facility] ORS;  Service: Orthopedics/Podiatry;  Laterality: Left;   LIPOMA EXCISION Right 08/15/2017   Procedure: EXCISION TUMOR(CYST) FOOT;  Surgeon: Recardo Evangelist, DPM;  Location: [facility] ORS;  Service: Podiatry;  Laterality: Right;   LOWER EXTREMITY ANGIOGRAPHY Left 07/03/2023   Procedure: Lower Extremity Angiography;  Surgeon: Annice Needy, MD;  Location: [facility] IN[facility]ASIVE [facility];  Service: Cardiovascular;  Laterality: Left;   OTHER SURGICAL HISTORY Bilateral Foot surgery    reports that he has quit smoking. His smoking use included cigarettes. He started smoking about 6 years ago. He has a 3.4 pack-year smoking history. He has never used smokeless tobacco. He reports current alcohol use of about 1.0 standard drink of alcohol per week. He reports that he does not use drugs.  Allergies  Allergen Reactions   Bee Venom Anaphylaxis  Linezolid Other (See Comments)    Thrombocytopenia, hyperlactatemia   Azithromycin Itching and Rash    Family History  Problem Relation Age of Onset   Hypertension Mother     Prior to Admission medications   Medication Sig Start Date End Date Taking? Authorizing Provider  acetaminophen (TYLENOL) 325 MG tablet Take 650 mg by mouth every 6 (six) hours as needed.    [provider]  albuterol (VENTOLIN HFA) 108 (90 Base) MCG/ACT inhaler Inhale 2 puffs into the lungs every 4 (four) hours as needed for wheezing or shortness of breath.    [provider]  allopurinol (ZYLOPRIM) 300 MG tablet Take 600 mg by mouth daily. 02/24/20   [provider]  amoxicillin-clavulanate (AUGMENTIN) 875-125 MG tablet Take 1 tablet by mouth every 12 (twelve) hours. 09/04/23   Loyce Dys, MD  aspirin EC 81 MG tablet Take 1 tablet by mouth daily.    [provider]  azelastine (ASTELIN) 0.1 % nasal spray Place 2 sprays into both nostrils  2 (two) times daily. 05/27/23   [provider]  bisacodyl (DULCOLAX) 5 MG EC tablet Take 1 tablet (5 mg total) by mouth daily as needed for moderate constipation. 09/04/23   Loyce Dys, MD  buprenorphine-naloxone (SUBOXONE) 8-2 mg SUBL SL tablet Place 1 tablet under the tongue in the morning, at noon, and at bedtime.    [provider]  clopidogrel (PLAVIX) 75 MG tablet Take 1 tablet (75 mg total) by mouth daily. 02/02/21   Willeen Niece, MD  cyanocobalamin (VITAMIN B12) 1000 MCG/ML injection Inject 1 mL (1,000 mcg total) into the muscle every 30 (thirty) days. 07/12/22   Earna Coder, MD  dapagliflozin propanediol (FARXIGA) 10 MG TABS tablet Take 10 mg by mouth daily.    [provider]  DULoxetine (CYMBALTA) 30 MG capsule Take 1 capsule by mouth daily. 01/17/16   [provider]  ezetimibe (ZETIA) 10 MG tablet Take 10 mg by mouth daily. 02/24/20   [provider]  fluticasone (FLONASE) 50 MCG/ACT nasal spray Place 1 spray into both nostrils daily as needed for allergies. 10/16/18   [provider]  Fluticasone-Umeclidin-Vilant (TRELEGY ELLIPTA) 100-62.5-25 MCG/ACT AEPB Inhale into the lungs. 09/12/21   [provider]  gabapentin (NEURONTIN) 800 MG tablet Take 800 mg by mouth 4 (four) times daily. 07/14/21   [provider]  levalbuterol Pauline Aus) 0.63 MG/3ML nebulizer solution Take 3 mLs (0.63 mg total) by nebulization every 6 (six) hours as needed for wheezing or shortness of breath. 09/04/23   Loyce Dys, MD  metFORMIN (GLUCOPHAGE) 1000 MG tablet Take 1,000 mg by mouth 2 (two) times daily with a meal.    [provider]  metoprolol succinate (TOPROL-XL) 50 MG 24 hr tablet Take 50 mg by mouth daily. 02/24/20   [provider]  ondansetron (ZOFRAN-ODT) 4 MG disintegrating tablet Take 4 mg by mouth every 8 (eight) hours as needed for nausea.    [provider]  OXYGEN Inhale 2 L into the lungs.     [provider]  pantoprazole (PROTONIX) 40 MG tablet Take 40 mg by mouth daily. 06/20/21   [provider]  predniSONE (DELTASONE) 20 MG tablet Take 1 tablet (20 mg total) by mouth daily with breakfast. 09/05/23   Loyce Dys, MD  roflumilast (DALIRESP) 500 MCG TABS tablet Take 500 mcg by mouth daily. 08/15/21   [provider]  simvastatin (ZOCOR) 40 MG tablet Take 1 tablet by  mouth daily. 01/17/16   [provider]  Syringe/Needle, Disp, (SYRINGE 3CC/25GX1") 25G X 1" 3 ML MISC 1 Syringe by Does not apply route every 30 (thirty) days. 07/12/22   Earna Coder, MD     Vitals:   10/04/23 2113 10/04/23 2130 10/04/23 2230 10/04/23 2330  BP:  102/61 108/66 103/62  Pulse:  (!) 103 99 100  Resp:  19 19   Temp: 98 F (36.7 C)     TempSrc: Oral     SpO2:  92% 93% 92%  Weight:      Height:       Physical Exam Vitals and nursing note reviewed.  Constitutional:      General: He is not in acute distress.    Interventions: Nasal cannula in place.  HENT:     Head: Normocephalic and atraumatic.     Right Ear: Hearing normal.     Left Ear: Hearing normal.     Nose: Nose normal. No nasal deformity.     Mouth/Throat:     Lips: Pink.     Tongue: No lesions.     Pharynx: Oropharynx is clear.  Eyes:     General: Lids are normal.     Extraocular Movements: Extraocular movements intact.  Cardiovascular:     Rate and Rhythm: Normal rate and regular rhythm.     Heart sounds: Normal heart sounds.  Pulmonary:     Effort: Pulmonary effort is normal.     Breath sounds: Decreased air movement present. Examination of the right-lower field reveals wheezing. Examination of the left-lower field reveals wheezing. Wheezing present.  Abdominal:     General: Bowel sounds are normal. There is no distension.     Palpations: Abdomen is soft. There is no mass.     Tenderness: There is no abdominal tenderness.  Musculoskeletal:     Right lower leg: No edema.     Left  lower leg: No edema.  Skin:    General: Skin is warm.  Neurological:     General: No focal deficit present.     Mental Status: He is alert and oriented to person, place, and time.     Cranial Nerves: Cranial nerves 2-12 are intact.  Psychiatric:        Attention and Perception: Attention normal.        Mood and Affect: Mood normal.        Speech: Speech normal.        Behavior: Behavior normal. Behavior is cooperative.      Labs on Admission: I have personally reviewed following labs and imaging studies Results for orders placed or performed during the hospital encounter of 10/04/23 (from the past 24 hour(s))  CBC with Differential     Status: Abnormal   Collection Time: 10/04/23  4:49 PM  Result Value Ref Range   WBC 11.1 (H) 4.0 - 10.5 K/uL   RBC 3.60 (L) 4.22 - 5.81 MIL/uL   Hemoglobin 10.1 (L) 13.0 - 17.0 g/dL   HCT 27.2 (L) 53.6 - 64.4 %   MCV 92.2 80.0 - 100.0 fL   MCH 28.1 26.0 - 34.0 pg   MCHC 30.4 30.0 - 36.0 g/dL   RDW 03.4 74.2 - 59.5 %   Platelets 293 150 - 400 K/uL   nRBC 0.0 0.0 - 0.2 %   Neutrophils Relative % 58 %   Neutro Abs 6.5 1.7 - 7.7 K/uL   Lymphocytes Relative 30 %   Lymphs Abs 3.3 0.7 -  4.0 K/uL   Monocytes Relative 8 %   Monocytes Absolute 0.9 0.1 - 1.0 K/uL   Eosinophils Relative 2 %   Eosinophils Absolute 0.3 0.0 - 0.5 K/uL   Basophils Relative 1 %   Basophils Absolute 0.1 0.0 - 0.1 K/uL   Immature Granulocytes 1 %   Abs Immature Granulocytes 0.08 (H) 0.00 - 0.07 K/uL  Comprehensive metabolic panel     Status: Abnormal   Collection Time: 10/04/23  4:49 PM  Result Value Ref Range   Sodium 134 (L) 135 - 145 mmol/L   Potassium 3.5 3.5 - 5.1 mmol/L   Chloride 97 (L) 98 - 111 mmol/L   CO2 30 22 - 32 mmol/L   Glucose, Bld 165 (H) 70 - 99 mg/dL   BUN 21 8 - 23 mg/dL   Creatinine, Ser 5.73 (H) 0.61 - 1.24 mg/dL   Calcium 9.0 8.9 - 22.0 mg/dL   Total Protein 7.7 6.5 - 8.1 g/dL   Albumin 3.2 (L) 3.5 - 5.0 g/dL   AST 28 15 - 41 U/L   ALT 20 0 -  44 U/L   Alkaline Phosphatase 95 38 - 126 U/L   Total Bilirubin 0.5 0.3 - 1.2 mg/dL   GFR, Estimated >25 >42 mL/min   Anion gap 7 5 - 15  Lactic acid, plasma     Status: None   Collection Time: 10/04/23  4:49 PM  Result Value Ref Range   Lactic Acid, Venous 1.9 0.5 - 1.9 mmol/L  Brain natriuretic peptide     Status: None   Collection Time: 10/04/23  4:49 PM  Result Value Ref Range   B Natriuretic Peptide 46.1 0.0 - 100.0 pg/mL  Procalcitonin     Status: None   Collection Time: 10/04/23  4:49 PM  Result Value Ref Range   Procalcitonin <0.10 ng/mL  SARS Coronavirus 2 by RT PCR (hospital order, performed in [facility] hospital lab) *cepheid single result test* Anterior Nasal Swab     Status: None   Collection Time: 10/04/23  4:49 PM   Specimen: Anterior Nasal Swab  Result Value Ref Range   SARS Coronavirus 2 by RT PCR NEGATIVE NEGATIVE  Magnesium     Status: Abnormal   Collection Time: 10/04/23  4:49 PM  Result Value Ref Range   Magnesium 3.1 (H) 1.7 - 2.4 mg/dL  Blood gas, venous     Status: Abnormal   Collection Time: 10/04/23  7:31 PM  Result Value Ref Range   pH, Ven 7.34 7.25 - 7.43   pCO2, Ven 61 (H) 44 - 60 mmHg   pO2, Ven 53 (H) 32 - 45 mmHg   Bicarbonate 32.9 (H) 20.0 - 28.0 mmol/L   Acid-Base Excess 5.2 (H) 0.0 - 2.0 mmol/L   O2 Saturation 85.1 %   Patient temperature 37.0    Collection site VEIN   Troponin I (High Sensitivity)     Status: None   Collection Time: 10/04/23  8:04 PM  Result Value Ref Range   Troponin I (High Sensitivity) 13 <18 ng/L  Ethanol     Status: None   Collection Time: 10/04/23  8:04 PM  Result Value Ref Range   Alcohol, Ethyl (B) <10 <10 mg/dL  Troponin I (High Sensitivity)     Status: None   Collection Time: 10/04/23 11:00 PM  Result Value Ref Range   Troponin I (High Sensitivity) 14 <18 ng/L    CBC: Recent Labs  Lab 10/04/23 1649  WBC 11.1*  NEUTROABS 6.5  HGB 10.1*  HCT 33.2*  MCV 92.2  PLT 293   Basic Metabolic  Panel: Recent Labs  Lab 10/04/23 1649  NA 134*  K 3.5  CL 97*  CO2 30  GLUCOSE 165*  BUN 21  CREATININE 1.29*  CALCIUM 9.0  MG 3.1*   GFR: Estimated Creatinine Clearance: 51.2 mL/min (A) (by C-G formula based on SCr of 1.29 mg/dL (H)). Liver Function Tests: Recent Labs  Lab 10/04/23 1649  AST 28  ALT 20  ALKPHOS 95  BILITOT 0.5  PROT 7.7  ALBUMIN 3.2*   No results for input(s): "LIPASE", "AMYLASE" in the last 168 hours. No results for input(s): "AMMONIA" in the last 168 hours. Coagulation Profile: No results for input(s): "INR", "PROTIME" in the last 168 hours. Cardiac Enzymes: No results for input(s): "CKTOTAL", "CKMB", "CKMBINDEX", "TROPONINI" in the last 168 hours. BNP (last 3 results) No results for input(s): "PROBNP" in the last 8760 hours. HbA1C: No results for input(s): "HGBA1C" in the last 72 hours. CBG: No results for input(s): "GLUCAP" in the last 168 hours. Lipid Profile: No results for input(s): "CHOL", "HDL", "LDLCALC", "TRIG", "CHOLHDL", "LDLDIRECT" in the last 72 hours. Thyroid Function Tests: No results for input(s): "TSH", "T4TOTAL", "FREET4", "T3FREE", "THYROIDAB" in the last 72 hours. Anemia Panel: No results for input(s): "VITAMINB12", "FOLATE", "FERRITIN", "TIBC", "IRON", "RETICCTPCT" in the last 72 hours. Urinalysis    Component Value Date/Time   COLORURINE YELLOW (A) 07/02/2023 1406   APPEARANCEUR CLEAR (A) 07/02/2023 1406   APPEARANCEUR Clear 03/24/2013 1853   LABSPEC 1.009 07/02/2023 1406   LABSPEC 1.020 03/24/2013 1853   PHURINE 6.0 07/02/2023 1406   GLUCOSEU NEGATIVE 07/02/2023 1406   GLUCOSEU Negative 03/24/2013 1853   HGBUR LARGE (A) 07/02/2023 1406   BILIRUBINUR NEGATIVE 07/02/2023 1406   BILIRUBINUR Negative 03/24/2013 1853   KETONESUR NEGATIVE 07/02/2023 1406   PROTEINUR NEGATIVE 07/02/2023 1406   NITRITE NEGATIVE 07/02/2023 1406   LEUKOCYTESUR NEGATIVE 07/02/2023 1406   LEUKOCYTESUR Negative 03/24/2013 1853   Unresulted  Labs (From admission, onward)     Start     Ordered   10/04/23 1645  Blood culture (routine x 2)  BLOOD CULTURE X 2,   STAT      10/04/23 1644            Medications  thiamine (VITAMIN B1) injection 100 mg (100 mg Intravenous Given 10/04/23 2249)  clopidogrel (PLAVIX) tablet 75 mg (75 mg Oral Not Given 10/04/23 2213)  predniSONE (DELTASONE) tablet 20 mg (has no administration in time range)  albuterol (PROVENTIL) (2.5 MG/3ML) 0.083% nebulizer solution 2.5 mg (has no administration in time range)  aspirin EC tablet 81 mg (81 mg Oral Given 10/04/23 2251)  buprenorphine-naloxone (SUBOXONE) 8-2 mg per SL tablet 1 tablet (1 tablet Sublingual Not Given 10/04/23 2212)  DULoxetine (CYMBALTA) DR capsule 30 mg (0 mg Oral Hold 10/04/23 2211)  metoprolol succinate (TOPROL-XL) 24 hr tablet 50 mg (50 mg Oral Not Given 10/04/23 2212)  roflumilast (DALIRESP) tablet 500 mcg (500 mcg Oral Not Given 10/04/23 2212)  methylPREDNISolone sodium succinate (SOLU-MEDROL) 125 mg/2 mL injection 120 mg (120 mg Intravenous Given 10/04/23 2250)    Followed by  predniSONE (DELTASONE) tablet 40 mg (has no administration in time range)  ipratropium-albuterol (DUONEB) 0.5-2.5 (3) MG/3ML nebulizer solution 3 mL (3 mLs Nebulization Given 10/04/23 2251)  sodium chloride flush (NS) 0.9 % injection 3 mL (3 mLs Intravenous Given 10/04/23 2251)  lactated ringers infusion ( Intravenous New Bag/Given 10/04/23 2304)  acetaminophen (TYLENOL) tablet 650 mg (has no administration in time range)    Or  acetaminophen (TYLENOL) suppository 650 mg (has no administration in time range)  HYDROcodone-acetaminophen (NORCO/VICODIN) 5-325 MG per tablet 1-2 tablet (has no administration in time range)  morphine (PF) 2 MG/ML injection 2 mg (has no administration in time range)  guaiFENesin (MUCINEX) 12 hr tablet 600 mg (has no administration in time range)  nicotine (NICODERM CQ - dosed in mg/24 hours) patch 14 mg (14 mg Transdermal Patient  Refused/Not Given 10/04/23 2251)  hydrALAZINE (APRESOLINE) injection 5 mg (has no administration in time range)  heparin injection 5,000 Units (5,000 Units Subcutaneous Given 10/04/23 2250)  ipratropium-albuterol (DUONEB) 0.5-2.5 (3) MG/3ML nebulizer solution 3 mL (3 mLs Nebulization Given 10/04/23 1651)  ipratropium-albuterol (DUONEB) 0.5-2.5 (3) MG/3ML nebulizer solution 3 mL (3 mLs Nebulization Given 10/04/23 1651)  sodium chloride 0.9 % bolus 500 mL (0 mLs Intravenous Stopped 10/04/23 2048)  iohexol (OMNIPAQUE) 300 MG/ML solution 75 mL (75 mLs Intravenous Contrast Given 10/04/23 2026)    Data Reviewed: Relevant notes from primary care and specialist visits, past discharge summaries as available in EHR, including Care Everywhere. Prior diagnostic testing as pertinent to current admission diagnoses Updated medications and problem lists for reconciliation ED course, including vitals, labs, imaging, treatment and response to treatment Triage notes, nursing and pharmacy notes and ED provider's notes Notable results as noted in HPI  Assessment & Plan Respiratory distress SpO2: 92 % O2 Flow Rate (L/min): 4 L/min FiO2 (%): 44 % Pt is off bipap. Clinically stable no longer in distress.  Dyspnea is resolved. We will continue with COPD and antibiotic coverage for possible aspiration as etiology fro persistent and enlargement of RUL nodule.  Pulmonary consult for bronchoscopy as deemed appropriate while here of outpatient if pt improved.   SOB (shortness of breath) 2/2 to COPD/ ? Malignancy . PET scan per pulmonary.  Acute on chronic respiratory failure with hypoxia (HCC) 2/2 to Advanced copd exacerbation and hypoxemia.  -Cont COPD t/t protocol.  - reassess oxygen need upon discharge.  COPD with acute exacerbation (HCC) Cont solumedrol.  Prn albuterol t/t and duoneb. IS and flutter valve.  Antibiotics.   Tobacco use disorder Pt states he has not smoked since past 7 years.    Chronic heart failure with preserved ejection fraction (HFpEF) (HCC) Currently patient is euvolemic.  No JVD no edema no third heart sound. Patient continued on aspirin 81, heparin 5000, metoprolol succinate 50 mg daily. Strict I's and O's.  Daily weight. 2 d echo 2 years in 2022 shows: IMPRESSIONS 1. Left ventricular ejection fraction, by estimation, is 55 to 60% . Left ventricular ejection fraction by 3D volume is 60 % . The left ventricle has normal function. The left ventricle has no regional wall motion abnormalities. Left ventricular diastolic parameters are consistent with Grade II diastolic dysfunction ( pseudonormalization) . The average left ventricular global longitudinal strain is - 11. 5 % . The global longitudinal strain is abnormal. 2. Right ventricular systolic function is normal. The right ventricular size is normal. 3. Right atrial size was mildly dilated. 4. The mitral valve is normal in structure. No evidence of mitral valve regurgitation. 5. The aortic valve was not well visualized. Aortic valve regurgitation is not visualized. 6. Pulmonic valve regurgitation not assessed. NSCLC of left lung (HCC) Stable post therapeutic changes within the left upper lobe, consistent with history of treated lung cancer. No recurrence noted on ct scan.  Insulin dependent type 2 diabetes mellitus (  HCC) Glycemic protocol.  Carb consistent cardiac diet.  Marcelline Deist and metformin held until pt can get back to his baseline diet.  Anemia Anemia since 2021. Suspect chronic . Currently stable at 10.1 we will follow cbc and monitor.  Chart shows GI note Jan 11,2024: EGD/Colonoscopy in the setting of Fe deficiency anemia and CRC screening. He has had overall reassuring colonoscopies in 2009 (1 polyp) and 2015 (0 polyps), he would not be due for 10 years. " Will defer to his Hem/Onc for additional evaluation of his anemia on discharge.     DVT prophylaxis:  Heparin   Consults:  None   Advance  Care Planning:    Code Status: Full Code   Family Communication:  None   Disposition Plan:  Home   Severity of Illness: The appropriate patient status for this patient is INPATIENT. Inpatient status is judged to be reasonable and necessary in order to provide the required intensity of service to ensure the patient's safety. The patient's presenting symptoms, physical exam findings, and initial radiographic and laboratory data in the context of their chronic comorbidities is felt to place them at high risk for further clinical deterioration. Furthermore, it is not anticipated that the patient will be medically stable for discharge from the hospital within 2 midnights of admission.   * I certify that at the point of admission it is my clinical judgment that the patient will require inpatient hospital care spanning beyond 2 midnights from the point of admission due to high intensity of service, high risk for further deterioration and high frequency of surveillance required.*  Author: Gertha Calkin, MD 10/04/2023 11:48 PM  For on call review [email or web address].

## 2023-10-05 ENCOUNTER — Encounter: Payer: Self-pay | Admitting: Internal Medicine

## 2023-10-05 ENCOUNTER — Inpatient Hospital Stay: Payer: 59

## 2023-10-05 DIAGNOSIS — J441 Chronic obstructive pulmonary disease with (acute) exacerbation: Principal | ICD-10-CM

## 2023-10-05 DIAGNOSIS — R0602 Shortness of breath: Secondary | ICD-10-CM | POA: Diagnosis not present

## 2023-10-05 LAB — GLUCOSE, CAPILLARY
Glucose-Capillary: 209 mg/dL — ABNORMAL HIGH (ref 70–99)
Glucose-Capillary: 203 mg/dL — ABNORMAL HIGH (ref 70–99)

## 2023-10-05 MED ORDER — HEPARIN SODIUM (PORCINE) 5000 UNIT/ML IJ SOLN
5000.0000 [IU] | Freq: Three times a day (TID) | INTRAMUSCULAR | Status: DC
  Administered 2023-10-05 – 2023-10-09 (×13): 5000 [IU] via SUBCUTANEOUS
  Filled 2023-10-05 (×14): qty 1

## 2023-10-05 MED ORDER — LEVALBUTEROL HCL 1.25 MG/0.5ML IN NEBU: 1.2500 mg | INHALATION_SOLUTION | Freq: Four times a day (QID) | RESPIRATORY_TRACT | Status: DC

## 2023-10-05 MED ORDER — INSULIN ASPART 100 UNIT/ML IJ SOLN
0.0000 [IU] | Freq: Every day | INTRAMUSCULAR | Status: DC
  Administered 2023-10-05 – 2023-10-08 (×2): 2 [IU] via SUBCUTANEOUS
  Filled 2023-10-05 (×2): qty 1

## 2023-10-05 MED ORDER — IPRATROPIUM BROMIDE 0.02 % IN SOLN: 0.5000 mg | Freq: Four times a day (QID) | RESPIRATORY_TRACT | Status: DC

## 2023-10-05 MED ORDER — IPRATROPIUM-ALBUTEROL 0.5-2.5 (3) MG/3ML IN SOLN
3.0000 mL | Freq: Three times a day (TID) | RESPIRATORY_TRACT | Status: DC
  Administered 2023-10-05: 3 mL via RESPIRATORY_TRACT
  Filled 2023-10-05: qty 3

## 2023-10-05 MED ORDER — PREDNISONE 20 MG PO TABS
40.0000 mg | ORAL_TABLET | Freq: Every day | ORAL | Status: AC
  Administered 2023-10-06 – 2023-10-08 (×3): 40 mg via ORAL
  Filled 2023-10-05 (×3): qty 2

## 2023-10-05 MED ORDER — THIAMINE MONONITRATE 100 MG PO TABS
100.0000 mg | ORAL_TABLET | Freq: Every day | ORAL | Status: DC
  Administered 2023-10-05 – 2023-10-09 (×5): 100 mg via ORAL
  Filled 2023-10-05 (×5): qty 1

## 2023-10-05 MED ORDER — INSULIN ASPART 100 UNIT/ML IJ SOLN
0.0000 [IU] | Freq: Three times a day (TID) | INTRAMUSCULAR | Status: DC
  Administered 2023-10-05: 3 [IU] via SUBCUTANEOUS
  Administered 2023-10-06: 1 [IU] via SUBCUTANEOUS
  Administered 2023-10-07: 2 [IU] via SUBCUTANEOUS
  Administered 2023-10-07: 1 [IU] via SUBCUTANEOUS
  Administered 2023-10-08: 2 [IU] via SUBCUTANEOUS
  Administered 2023-10-08: 3 [IU] via SUBCUTANEOUS
  Filled 2023-10-05 (×7): qty 1

## 2023-10-05 MED ORDER — GADOBUTROL 1 MMOL/ML IV SOLN
7.0000 mL | Freq: Once | INTRAVENOUS | Status: AC | PRN
Start: 2023-10-05 — End: 2023-10-05
  Administered 2023-10-05: 7 mL via INTRAVENOUS

## 2023-10-05 MED ORDER — PREDNISONE 20 MG PO TABS
20.0000 mg | ORAL_TABLET | Freq: Once | ORAL | Status: AC
  Administered 2023-10-05: 20 mg via ORAL
  Filled 2023-10-05: qty 1

## 2023-10-05 MED ORDER — GABAPENTIN 300 MG PO CAPS
600.0000 mg | ORAL_CAPSULE | Freq: Three times a day (TID) | ORAL | Status: DC
  Administered 2023-10-05 – 2023-10-09 (×12): 600 mg via ORAL
  Filled 2023-10-05 (×12): qty 2

## 2023-10-05 MED ORDER — IPRATROPIUM BROMIDE 0.02 % IN SOLN
0.5000 mg | Freq: Three times a day (TID) | RESPIRATORY_TRACT | Status: DC
  Administered 2023-10-05 – 2023-10-06 (×2): 0.5 mg via RESPIRATORY_TRACT
  Filled 2023-10-05 (×2): qty 2.5

## 2023-10-05 MED ORDER — BUPRENORPHINE HCL-NALOXONE HCL 8-2 MG SL SUBL
1.0000 | SUBLINGUAL_TABLET | Freq: Three times a day (TID) | SUBLINGUAL | Status: DC
  Administered 2023-10-05 – 2023-10-09 (×12): 1 via SUBLINGUAL
  Filled 2023-10-05 (×14): qty 1

## 2023-10-05 MED ORDER — LEVALBUTEROL HCL 1.25 MG/0.5ML IN NEBU
1.2500 mg | INHALATION_SOLUTION | Freq: Three times a day (TID) | RESPIRATORY_TRACT | Status: DC
  Administered 2023-10-05 – 2023-10-06 (×2): 1.25 mg via RESPIRATORY_TRACT
  Filled 2023-10-05 (×2): qty 0.5

## 2023-10-05 MED ORDER — GABAPENTIN 100 MG PO CAPS
200.0000 mg | ORAL_CAPSULE | Freq: Three times a day (TID) | ORAL | Status: DC
  Administered 2023-10-05: 200 mg via ORAL
  Filled 2023-10-05: qty 2

## 2023-10-05 NOTE — Evaluation (Signed)
Physical Therapy Evaluation Patient Details Name: Christian Fields MRN: [id number] DOB: [birth year] Today's Date: 10/05/2023  History of Present Illness  Pt is a 67 y/o M admitted on 10/04/23 after presenting with c/o worsening cough, SOB, & respiratory distress. PMH: COPD Gold Stage IV, chronic hypoxic respiratory failure on 4L O2, HTN, HLD, DM2, OSA not on CPAP, PVD on aspirin, chronic pain syndrome, non-small lunch carcinoma s/p treatment, chronic HFpEF, diabetic neuropathy, anxiety  Clinical Impression  Pt seen for PT evaluation with pt agreeable to tx. Pt reports he was in rehab prior to admission but at baseline pt is independent without AD, occasional use of RW or SPC. On this date, pt is able to complete transfers & short distance gait in room with RW & supervision with PT managing O2 tubing. Pt is limited by O2 requirements during mobility. Will continue to follow pt acutely to address balance, gait, strengthening, & stair negotiation.  Pt received & left on 4L/min via nasal cannula Pt increased to 5L/min with standing/gait, lowest SpO2 of 80% after walking but pt recovered within ~1 minute with seated rest break & cuing for pursed lip breathing.        If plan is discharge home, recommend the following: A little help with bathing/dressing/bathroom;Assistance with cooking/housework;A little help with walking and/or transfers;Assist for transportation;Help with stairs or ramp for entrance   Can travel by private vehicle   Yes    Equipment Recommendations None recommended by PT  Recommendations for Other Services       Functional Status Assessment Patient has had a recent decline in their functional status and demonstrates the ability to make significant improvements in function in a reasonable and predictable amount of time.     Precautions / Restrictions Precautions Precautions: None Restrictions Weight Bearing Restrictions: No      Mobility  Bed Mobility                General bed mobility comments: not tested, pt received& left in recliner    Transfers Overall transfer level: Needs assistance Equipment used: Rolling walker (2 wheels) Transfers: Sit to/from Stand Sit to Stand: Supervision                Ambulation/Gait Ambulation/Gait assistance: Supervision Gait Distance (Feet): 18 Feet Assistive device: Rolling walker (2 wheels)   Gait velocity: decreased     General Gait Details: PT manages O2 tubing  Stairs            Wheelchair Mobility     Tilt Bed    Modified Rankin (Stroke Patients Only)       Balance Overall balance assessment: Needs assistance   Sitting balance-Leahy Scale: Good     Standing balance support: During functional activity, Bilateral upper extremity supported Standing balance-Leahy Scale: Fair                               Pertinent Vitals/Pain Pain Assessment Pain Assessment: Faces Faces Pain Scale: Hurts a little bit Pain Location: BUE/BLE 2/2 neuropathy Pain Descriptors / Indicators:  (neuropathic pain) Pain Intervention(s): Monitored during session    Home Living Family/patient expects to be discharged to:: Private residence Living Arrangements: Children Available Help at Discharge: Family;Available PRN/intermittently Type of Home: Mobile home Home Access: Stairs to enter Entrance Stairs-Rails: Right;Left;Can reach both Entrance Stairs-Number of Steps: 8   Home Layout: One level Home Equipment: Agricultural consultant (2 wheels);Cane - single point;Rollator (4 wheels)  Prior Function Prior Level of Function : Needs assist       Physical Assist : Mobility (physical);ADLs (physical)   ADLs (physical): Bathing Mobility Comments: Pt reports he was in rehab prior to admission. Reports at baseline he ambulates without AD. ADLs Comments: reports MOD I with ADL and requires assist with cooking, cleaning, driving which son and daughter help with as needed. Pt reports  that he has been bathing in a tub shower.     Extremity/Trunk Assessment   Upper Extremity Assessment Upper Extremity Assessment: Overall WFL for tasks assessed    Lower Extremity Assessment Lower Extremity Assessment: Overall WFL for tasks assessed;Generalized weakness       Communication   Communication Communication: No apparent difficulties Cueing Techniques: Verbal cues  Cognition Arousal: Alert Behavior During Therapy: WFL for tasks assessed/performed Overall Cognitive Status: Within Functional Limits for tasks assessed                                          General Comments General comments (skin integrity, edema, etc.): Pt performs standing & marching in place for strengthening & endurance training.    Exercises     Assessment/Plan    PT Assessment Patient needs continued PT services  PT Problem List Cardiopulmonary status limiting activity;Decreased activity tolerance;Decreased balance;Decreased mobility;Decreased safety awareness;Decreased knowledge of use of DME       PT Treatment Interventions DME instruction;Balance training;Modalities;Gait training;Neuromuscular re-education;Stair training;Functional mobility training;Therapeutic activities;Therapeutic exercise;Patient/family education    PT Goals (Current goals can be found in the Care Plan section)  Acute Rehab PT Goals Patient Stated Goal: go to rehab, get better PT Goal Formulation: With patient Time For Goal Achievement: 10/19/23 Potential to Achieve Goals: Good    Frequency Min 1X/week     Co-evaluation               AM-PAC PT "6 Clicks" Mobility  Outcome Measure Help needed turning from your back to your side while in a flat bed without using bedrails?: None Help needed moving from lying on your back to sitting on the side of a flat bed without using bedrails?: None Help needed moving to and from a bed to a chair (including a wheelchair)?: A Little Help needed  standing up from a chair using your arms (e.g., wheelchair or bedside chair)?: A Little Help needed to walk in hospital room?: A Little Help needed climbing 3-5 steps with a railing? : A Little 6 Click Score: 20    End of Session Equipment Utilized During Treatment: Oxygen Activity Tolerance: Patient tolerated treatment well (limited by pt wanting to finish lunch) Patient left: in chair;with chair alarm set;with call bell/phone within reach   PT Visit Diagnosis: Other abnormalities of gait and mobility (R26.89)    Time: 1610-9604 PT Time Calculation (min) (ACUTE ONLY): 18 min   Charges:   PT Evaluation $PT Eval Low Complexity: 1 Low   PT General Charges $$ ACUTE PT VISIT: 1 Visit         Aleda Grana, PT, DPT 10/05/23, 2:18 PM   Sandi Mariscal 10/05/2023, 2:16 PM

## 2023-10-05 NOTE — Progress Notes (Signed)
1      PROGRESS NOTE    Christian Fields  ZOX:[id number] DOB: [birth year] DOA: 10/04/2023 PCP: Alease Medina, MD    Brief Narrative:   68 y.o. male with medical history significant for COPD Gold stage IV, chronic hypoxic respiratory failure on 4 L continuously, HTN, HLD, IIDM, OSA not on CPAP, PVD on aspirin Plavix chronic pain syndrome on Suboxone non-small cell lung carcinoma status post treatment, COPD stage II, chronic HFpEF, diabetic neuropathy admitted for worsening shortness of breath  10/27: DC telemetry, transfer to any MedSurg, podiatry and palliative care consult   Assessment & Plan:   Principal Problem:   Respiratory distress Active Problems:   SOB (shortness of breath)   Acute on chronic respiratory failure with hypoxia (HCC)   COPD with acute exacerbation (HCC)   Tobacco use disorder   Chronic heart failure with preserved ejection fraction (HFpEF) (HCC)   NSCLC of left lung (HCC)   Insulin dependent type 2 diabetes mellitus (HCC)   Anemia  Chronic respiratory failure with hypoxia (HCC) Due to underlying COPD chronically on 4 l/min O2.  Initially required BiPAP while in the ED and now on 4 L oxygen Of note patient was recently at [facility] and discharged after 23 days of hospitalization per patient, he was discharged to [facility] healthcare rehab. --Consult Pulmonology -secure message sent to Dr. Meredeth Ide --Supplement O2, wean as tolerated --Target spO2 > 88--92% -- Continue steroid therapy --Changed Duonebs & albuterol to Ipratropium and Xopenex on account of tachycardia -- Continue DALIRESP  --Weaned off bipap - stable to transfer to any MedSurg floor --Pulmonary hygiene with IS, flutter --Follow blood and sputum cultures --Mucinex & other supportive care per orders   Hypermagnesemia Magnesium of 3.1, could be iatrogenic.  Will monitor for now   Chronic heart failure with preserved ejection fraction (HFpEF) (HCC) Well compensated for now.  LVEF of 55  to 60%   PVD (peripheral vascular disease) (HCC) On ASA, Plavix   Chronic pain syndrome Home regimen - Suboxone TID, gabapentin 800 mg PO QID Had to cut back on gabapentin dose to 600 mg p.o. 3 times daily due to renal function.  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 Follows with Dr. Ether Griffins - likely missed outpatient appointment.   --Non-weight-bearing on LLE -- Dr. Ether Griffins will see him on 10/29.  I have requested consult --WOC consulted for wound care  Will obtain MRI of the left heel/ankle for further evaluation   Insulin dependent type 2 diabetes mellitus (HCC) Sliding scale Novolog for now   Anemia of chronic disease Stable H&H  DVT prophylaxis:  heparin injection 5,000 Units Start: 10/05/23 1400     Code Status: (Full code Family Communication: None at bedside Disposition Plan: Possible discharge to home with home health in next 2 to 3 days depending on clinical condition   Consultants:  Podiatry Pulmonary     Subjective:  He is requesting to make sure his pain medication and gabapentin adjusted per his home regimen.  His breathing is improved  Objective: Vitals:   10/05/23 0903 10/05/23 1205 10/05/23 1307 10/05/23 1532  BP: 112/60 (!) 101/50  108/61  Pulse: 99 95  (!) 102  Resp: 20 (!) 22  20  Temp: 97.9 F (36.6 C) 98.5 F (36.9 C)  97.9 F (36.6 C)  TempSrc: Oral Oral  Oral  SpO2: 91% 93%  92% 90%  Weight:      Height:        Intake/Output Summary (Last 24 hours) at 10/05/2023 1548 Last data filed at 10/05/2023 1500 Gross per 24 hour  Intake 612.5 ml  Output 450 ml  Net 162.5 ml   Filed Weights   10/04/23 1646  Weight: 75.7 kg    Examination:  General exam: Appears calm and comfortable  Respiratory system: Clear to auscultation. Respiratory effort normal.  Using 4 L oxygen via nasal  cannula Cardiovascular system: S1 & S2 heard, RRR. No JVD, murmurs, rubs, gallops or clicks. No pedal edema. Gastrointestinal system: Abdomen is soft, benign Central nervous system: Alert and oriented. No focal neurological deficits. Extremities: Symmetric 5 x 5 power. Skin: Left heel full-thickness ulcer with callus Psychiatry: Judgement and insight appear normal. Mood & affect appropriate.     Data Reviewed: I have personally reviewed following labs and imaging studies  CBC: Recent Labs  Lab 10/04/23 1649  WBC 11.1*  NEUTROABS 6.5  HGB 10.1*  HCT 33.2*  MCV 92.2  PLT 293   Basic Metabolic Panel: Recent Labs  Lab 10/04/23 1649  NA 134*  K 3.5  CL 97*  CO2 30  GLUCOSE 165*  BUN 21  CREATININE 1.29*  CALCIUM 9.0  MG 3.1*   GFR: Estimated Creatinine Clearance: 51.2 mL/min (A) (by C-G formula based on SCr of 1.29 mg/dL (H)). Liver Function Tests: Recent Labs  Lab 10/04/23 1649  AST 28  ALT 20  ALKPHOS 95  BILITOT 0.5  PROT 7.7  ALBUMIN 3.2*    Sepsis Labs: Recent Labs  Lab 10/04/23 1649  PROCALCITON <0.10  LATICACIDVEN 1.9    Recent Results (from the past 240 hour(s))  Blood culture (routine x 2)     Status: None (Preliminary result)   Collection Time: 10/04/23  4:49 PM   Specimen: BLOOD  Result Value Ref Range Status   Specimen Description BLOOD RIGHT ANTECUBITAL  Final   Special Requests   Final    BOTTLES DRAWN AEROBIC AND ANAEROBIC Blood Culture results may not be optimal due to an excessive volume of blood received in culture bottles   Culture   Final    NO GROWTH < 12 HOURS Performed at [facility], [address] [place], [place] [address]    Report Status PENDING  Incomplete  Blood culture (routine x 2)     Status: None (Preliminary result)   Collection Time: 10/04/23  4:49 PM   Specimen: BLOOD  Result Value Ref Range Status   Specimen Description BLOOD BLOOD RIGHT HAND  Final   Special Requests   Final    BOTTLES DRAWN  AEROBIC AND ANAEROBIC Blood Culture adequate volume   Culture   Final    NO GROWTH < 12 HOURS Performed at [facility], [address], [place], [place] [address]    Report Status PENDING  Incomplete  SARS Coronavirus 2 by RT PCR (hospital order, performed in [facility] hospital lab) *cepheid single result test* Anterior Nasal Swab     Status: None   Collection Time: 10/04/23  4:49 PM   Specimen: Anterior Nasal Swab  Result Value Ref Range Status   SARS Coronavirus 2 by RT PCR NEGATIVE NEGATIVE Final    Comment: (NOTE) SARS-CoV-2 target nucleic acids are NOT DETECTED.  The SARS-CoV-2 RNA is generally detectable in upper and lower respiratory specimens during the acute phase of infection. The lowest concentration of SARS-CoV-2 viral copies this assay can  detect is 250 copies / mL. A negative result does not preclude SARS-CoV-2 infection and should not be used as the sole basis for treatment or other patient management decisions.  A negative result may occur with improper specimen collection / handling, submission of specimen other than nasopharyngeal swab, presence of viral mutation(s) within the areas targeted by this assay, and inadequate number of viral copies (<250 copies / mL). A negative result must be combined with clinical observations, patient history, and epidemiological information.  Fact Sheet for Patients:   RoadLapTop.co.za  Fact Sheet for Healthcare Providers: http://kim-miller.com/  This test is not yet approved or  cleared by the [place] FDA and has been authorized for detection and/or diagnosis of SARS-CoV-2 by FDA under an Emergency Use Authorization (EUA).  This EUA will remain in effect (meaning this test can be used) for the duration of the COVID-19 declaration under Section 564(b)(1) of the Act, 21 U.S.C. section 360bbb-3(b)(1), unless the authorization is terminated or revoked  sooner.  Performed at [facility], [address] [place], [place] [address]          Radiology Studies: CT CHEST W CONTRAST  Result Date: 10/04/2023 CLINICAL DATA:  Short of breath, COPD exacerbation, respiratory distress EXAM: CT CHEST WITH CONTRAST TECHNIQUE: Multidetector CT imaging of the chest was performed during intravenous contrast administration. RADIATION DOSE REDUCTION: This exam was performed according to the departmental dose-optimization program which includes automated exposure control, adjustment of the mA and/or kV according to patient size and/or use of iterative reconstruction technique. CONTRAST:  75mL OMNIPAQUE IOHEXOL 300 MG/ML  SOLN COMPARISON:  08/26/2023, 08/30/2023, 10/04/2023 FINDINGS: Cardiovascular: The heart is unremarkable without pericardial effusion. Dilated main pulmonary arteries consistent with pulmonary arterial hypertension, stable. No evidence of thoracic aortic aneurysm or dissection. Atherosclerosis of the aorta and coronary vasculature again noted. Mediastinum/Nodes: Thyroid, trachea, and esophagus are unremarkable. Stable mediastinal and hilar adenopathy. Index precarinal lymph node measures up to 15 mm reference image 70/2, unchanged since prior exam. Lungs/Pleura: Diffuse emphysema again noted. Stable subpleural masslike density within the left upper lobe measuring 1.6 x 2.0 cm reference image 50/2, likely site of prior treated lung cancer. Interval enlargement of the left upper lobe nodularity seen on prior exam, now measuring 17 x 11 mm reference image 51/3, previously measuring 7 x 6 mm on the 08/26/2023 exam. The punctate subpleural nodules within the right middle lobe on prior study are unchanged. No new areas of airspace disease, effusion, or pneumothorax. Chronic areas of scarring at the lung bases, most pronounced within the lingula. Central airways are patent. Upper Abdomen: No acute abnormality. Musculoskeletal: No acute or  destructive bony abnormalities. Stable mild wedge compression deformity of the T5 vertebral body. Stable spondylosis at the thoracolumbar junction. Reconstructed images demonstrate no additional findings. IMPRESSION: 1. Progressive enlargement of the right upper lobe nodule seen on prior exam, now measuring 17 x 11 mm. While the patient has a history of waxing and waning pulmonary nodularity presumed to be inflammatory or infectious, the persistence since the 08/30/2023 exam is somewhat concerning. PET-CT or interval 3 month follow-up chest CT are recommended for further evaluation. 2. Stable mediastinal and hilar adenopathy, nonspecific. This could also be re-evaluated at the time of follow-up imaging. 3. Stable post therapeutic changes within the left upper lobe, consistent with history of treated lung cancer. 4. Aortic Atherosclerosis (ICD10-I70.0) and Emphysema (ICD10-J43.9). 5. Stable enlarged pulmonary arteries consistent with pulmonary arterial hypertension. Electronically Signed   By: Maxwell Caul.D.  On: 10/04/2023 21:11   DG Chest Portable 1 View  Result Date: 10/04/2023 CLINICAL DATA:  SOB EXAM: PORTABLE CHEST 1 VIEW COMPARISON:  August 30, 2023 FINDINGS: The cardiomediastinal silhouette is unchanged in contour.Atherosclerotic calcifications. No pleural effusion. No pneumothorax. Similar appearance of bilateral nodular opacities, better evaluated on recent chest CT. RIGHT-sided predominant bronchitic markings and mild bronchial wall cuffing. Emphysematous changes. IMPRESSION: 1. RIGHT-sided predominant bronchitic markings and mild bronchial wall cuffing. This could reflect bronchitis. 2. Similar appearance of bilateral nodular opacities, better evaluated on recent chest CT. Electronically Signed   By: Meda Klinefelter M.D.   On: 10/04/2023 17:31        Scheduled Meds:  aspirin EC  81 mg Oral Daily   buprenorphine-naloxone  1 tablet Sublingual TID   clopidogrel  75 mg Oral Daily    DULoxetine  30 mg Oral Daily   gabapentin  600 mg Oral TID   heparin  5,000 Units Subcutaneous Q8H   ipratropium-albuterol  3 mL Nebulization TID   metoprolol succinate  50 mg Oral Daily   nicotine  14 mg Transdermal Daily   [START ON 10/06/2023] predniSONE  40 mg Oral Q breakfast   roflumilast  500 mcg Oral Daily   sodium chloride flush  3 mL Intravenous Q12H   thiamine  100 mg Oral Daily   Continuous Infusions:   LOS: 1 day    Time spent: 35 minutes    Delfino Lovett, MD [facility] Pager [phone]  If 7PM-7AM, please contact night-coverage [email or web address] Password [facility] 10/05/2023, 3:48 PM

## 2023-10-05 NOTE — Evaluation (Signed)
Occupational Therapy Evaluation Patient Details Name: Christian Fields MRN: [id number] DOB: 1956/03/09 Today's Date: 10/05/2023   History of Present Illness Christian Fields is a 67 y.o. male  with PMHx COPD, HTN, HLD, chronic O2 requirement, here with cough, SOB. Pt reports that for the past 2 days he has had worsening cough and SOB. He just was d/c'ed from [facility], where he had been admitted for acute CHF and COPD. He was discharged to SNF. He has been at [facility]. Reports he felt "okay" for a day or so then this began. He was on ABX during his stay. No fevers. No CP.   Clinical Impression   Pt was seen for OT evaluation this date. Prior to hospital admission, pt reports Mod I with ADL's and requires assist with cooking, cleaning, and driving, which son and daughter help with as needed. Pt lives with son in a mobile home but was recently getting rehab at [facility]. Pt presents to acute OT demonstrating impaired ADL performance and functional mobility 2/2 (See OT problem list for additional functional deficits). Upon arrival to room pt supine in bed, agreeable to tx. Pt impulsive during session and needed VC. Pt completed donning sock circle sitting in the bed with HOB elevated with Supervision. Pt completed supine to sit at the EOB with supervision. Pt completed sit<>stand t/f with CGA+RW. Pt offered bathroom use and declined. Pt requested sitting up in chair. Pt completed a stand pivot from EOB to chair with CGA+RW. Pt left in chair with call bell within reach and chair alarm. Pt would benefit from skilled OT services to address noted impairments and functional limitations (see below for any additional details) in order to maximize safety and independence while minimizing falls risk and caregiver burden. Anticipate the need for follow up OT services upon acute hospital DC.       If plan is discharge home, recommend the following: A little help with walking and/or transfers;Assistance with  cooking/housework;Assist for transportation;Help with stairs or ramp for entrance    Functional Status Assessment  Patient has had a recent decline in their functional status and demonstrates the ability to make significant improvements in function in a reasonable and predictable amount of time.  Equipment Recommendations  BSC/3in1    Recommendations for Other Services       Precautions / Restrictions Precautions Precautions: None Restrictions Weight Bearing Restrictions: No      Mobility Bed Mobility Overal bed mobility: Needs Assistance Bed Mobility: Supine to Sit     Supine to sit: Supervision       Patient Response: Impulsive  Transfers Overall transfer level: Needs assistance Equipment used: Rolling walker (2 wheels) Transfers: Sit to/from Stand, Bed to chair/wheelchair/BSC Sit to Stand: Supervision Stand pivot transfers: Supervision                Balance Overall balance assessment: Needs assistance Sitting-balance support: Bilateral upper extremity supported, Feet supported Sitting balance-Leahy Scale: Good       Standing balance-Leahy Scale: Fair                             ADL either performed or assessed with clinical judgement   ADL Overall ADL's : Needs assistance/impaired                     Lower Body Dressing: Supervision/safety;Sitting/lateral leans;Bed level   Toilet Transfer: Supervision/safety;Set up;BSC/3in1;Stand-pivot;Rolling walker (2 wheels) Toilet Transfer Details (indicate cue type  and reason): Simulated         Functional mobility during ADLs: Supervision/safety;Set up;Rolling walker (2 wheels)       Vision Baseline Vision/History: 1 Wears glasses Patient Visual Report: No change from baseline       Perception         Praxis         Pertinent Vitals/Pain Pain Assessment Pain Assessment: No/denies pain     Extremity/Trunk Assessment Upper Extremity Assessment Upper Extremity  Assessment: Overall WFL for tasks assessed   Lower Extremity Assessment Lower Extremity Assessment: Defer to PT evaluation       Communication Communication Communication: No apparent difficulties Cueing Techniques: Verbal cues   Cognition Arousal: Alert Behavior During Therapy: Impulsive Overall Cognitive Status: Within Functional Limits for tasks assessed                                       General Comments       Exercises     Shoulder Instructions      Home Living Family/patient expects to be discharged to:: Private residence Living Arrangements: Children Available Help at Discharge: Family;Available PRN/intermittently Type of Home: Mobile home Home Access: Stairs to enter Entrance Stairs-Number of Steps: 8 Entrance Stairs-Rails: Right;Left;Can reach both Home Layout: One level     Bathroom Shower/Tub: Chief Strategy Officer: Standard     Home Equipment: Agricultural consultant (2 wheels);Cane - single point          Prior Functioning/Environment Prior Level of Function : Needs assist       Physical Assist : Mobility (physical);ADLs (physical)   ADLs (physical): Bathing   ADLs Comments: reports MOD I with ADL and requires assist with cooking, cleaning, driving which son and daughter help with as needed. Pt reports that he has been bathing in a tub shower.        OT Problem List: Decreased strength;Decreased range of motion;Decreased activity tolerance;Decreased coordination;Impaired balance (sitting and/or standing);Decreased safety awareness      OT Treatment/Interventions: Self-care/ADL training;Therapeutic exercise;Therapeutic activities;Energy conservation;DME and/or AE instruction    OT Goals(Current goals can be found in the care plan section) Acute Rehab OT Goals Patient Stated Goal: to go home. OT Goal Formulation: With patient Time For Goal Achievement: 10/18/23 Potential to Achieve Goals: Good  OT Frequency: Min  1X/week    Co-evaluation              AM-PAC OT "6 Clicks" Daily Activity     Outcome Measure Help from another person eating meals?: None Help from another person taking care of personal grooming?: None Help from another person toileting, which includes using toliet, bedpan, or urinal?: A Little Help from another person bathing (including washing, rinsing, drying)?: A Little Help from another person to put on and taking off regular upper body clothing?: None Help from another person to put on and taking off regular lower body clothing?: A Little 6 Click Score: 21   End of Session Equipment Utilized During Treatment: Rolling walker (2 wheels);Oxygen  Activity Tolerance: Patient tolerated treatment well Patient left: in chair;with call bell/phone within reach;with chair alarm set  OT Visit Diagnosis: Unsteadiness on feet (R26.81);Other abnormalities of gait and mobility (R26.89);Muscle weakness (generalized) (M62.81)                Time: 6578-4696 OT Time Calculation (min): 17 min Charges:     Butch Penny,  SOT

## 2023-10-05 NOTE — Progress Notes (Signed)
PHARMACIST - PHYSICIAN COMMUNICATION  DR:   Sherryll Burger  CONCERNING: IV to Oral Route Change Policy  RECOMMENDATION: This patient is receiving thiamine by the intravenous route.  Based on criteria approved by the Pharmacy and Therapeutics Committee, the intravenous medication(s) is/are being converted to the equivalent oral dose form(s).   DESCRIPTION: These criteria include: The patient is eating (either orally or via tube) and/or has been taking other orally administered medications for a least 24 hours The patient has no evidence of active gastrointestinal bleeding or impaired GI absorption (gastrectomy, short bowel, patient on TNA or NPO).  If you have questions about this conversion, please contact the Pharmacy Department  []   [phone] )  [facility] [x]   [phone] )  [facility] []   [phone] )  [facility] []   [phone] )  [facility] []   [phone] )  [facility]   Lowella Bandy, [facility] 10/05/2023 9:35 AM

## 2023-10-05 NOTE — Plan of Care (Signed)
  Problem: Education: Goal: Knowledge of disease or condition will improve Outcome: Progressing Goal: Knowledge of the prescribed therapeutic regimen will improve Outcome: Progressing   Problem: Activity: Goal: Ability to tolerate increased activity will improve Outcome: Progressing Goal: Will verbalize the importance of balancing activity with adequate rest periods Outcome: Progressing   Problem: Respiratory: Goal: Ability to maintain a clear airway will improve Outcome: Progressing Goal: Ability to maintain adequate ventilation will improve Outcome: Progressing   Problem: Education: Goal: Knowledge of General Education information will improve Description: Including pain rating scale, medication(s)/side effects and non-pharmacologic comfort measures Outcome: Progressing   Problem: Health Behavior/Discharge Planning: Goal: Ability to manage health-related needs will improve Outcome: Progressing   Problem: Clinical Measurements: Goal: Will remain free from infection Outcome: Progressing Goal: Diagnostic test results will improve Outcome: Progressing Goal: Respiratory complications will improve Outcome: Progressing Goal: Cardiovascular complication will be avoided Outcome: Progressing   Problem: Activity: Goal: Risk for activity intolerance will decrease Outcome: Progressing   Problem: Nutrition: Goal: Adequate nutrition will be maintained Outcome: Progressing   Problem: Coping: Goal: Level of anxiety will decrease Outcome: Progressing   Problem: Elimination: Goal: Will not experience complications related to bowel motility Outcome: Progressing   Problem: Pain Management: Goal: General experience of comfort will improve Outcome: Progressing

## 2023-10-06 DIAGNOSIS — L97421 Non-pressure chronic ulcer of left heel and midfoot limited to breakdown of skin: Secondary | ICD-10-CM

## 2023-10-06 DIAGNOSIS — E11621 Type 2 diabetes mellitus with foot ulcer: Secondary | ICD-10-CM

## 2023-10-06 DIAGNOSIS — J9601 Acute respiratory failure with hypoxia: Secondary | ICD-10-CM

## 2023-10-06 DIAGNOSIS — M86172 Other acute osteomyelitis, left ankle and foot: Secondary | ICD-10-CM | POA: Diagnosis not present

## 2023-10-06 DIAGNOSIS — J441 Chronic obstructive pulmonary disease with (acute) exacerbation: Secondary | ICD-10-CM | POA: Diagnosis not present

## 2023-10-06 DIAGNOSIS — J9621 Acute and chronic respiratory failure with hypoxia: Secondary | ICD-10-CM | POA: Diagnosis not present

## 2023-10-06 LAB — CBC
HCT: 29.1 % — ABNORMAL LOW (ref 39.0–52.0)
Hemoglobin: 8.8 g/dL — ABNORMAL LOW (ref 13.0–17.0)
MCH: 27.7 pg (ref 26.0–34.0)
MCHC: 30.2 g/dL (ref 30.0–36.0)
MCV: 91.5 fL (ref 80.0–100.0)
Platelets: 294 10*3/uL (ref 150–400)
RBC: 3.18 MIL/uL — ABNORMAL LOW (ref 4.22–5.81)
RDW: 15.3 % (ref 11.5–15.5)
WBC: 21.1 10*3/uL — ABNORMAL HIGH (ref 4.0–10.5)
nRBC: 0 % (ref 0.0–0.2)

## 2023-10-06 LAB — BASIC METABOLIC PANEL
Anion gap: 7 (ref 5–15)
BUN: 33 mg/dL — ABNORMAL HIGH (ref 8–23)
CO2: 30 mmol/L (ref 22–32)
Calcium: 8.3 mg/dL — ABNORMAL LOW (ref 8.9–10.3)
Chloride: 101 mmol/L (ref 98–111)
Creatinine, Ser: 1.31 mg/dL — ABNORMAL HIGH (ref 0.61–1.24)
GFR, Estimated: 59 mL/min — ABNORMAL LOW (ref >60)
Glucose, Bld: 151 mg/dL — ABNORMAL HIGH (ref 70–99)
Potassium: 4.1 mmol/L (ref 3.5–5.1)
Sodium: 138 mmol/L (ref 135–145)

## 2023-10-06 LAB — GLUCOSE, CAPILLARY
Glucose-Capillary: 101 mg/dL — ABNORMAL HIGH (ref 70–99)
Glucose-Capillary: 97 mg/dL (ref 70–99)
Glucose-Capillary: 134 mg/dL — ABNORMAL HIGH (ref 70–99)
Glucose-Capillary: 289 mg/dL — ABNORMAL HIGH (ref 70–99)
Glucose-Capillary: 118 mg/dL — ABNORMAL HIGH (ref 70–99)
Glucose-Capillary: 155 mg/dL — ABNORMAL HIGH (ref 70–99)

## 2023-10-06 MED ORDER — CLONAZEPAM 0.25 MG PO TBDP
1.0000 mg | ORAL_TABLET | Freq: Two times a day (BID) | ORAL | Status: DC
  Administered 2023-10-06 – 2023-10-09 (×7): 1 mg via ORAL
  Filled 2023-10-06 (×7): qty 4

## 2023-10-06 MED ORDER — IPRATROPIUM BROMIDE 0.02 % IN SOLN
0.5000 mg | Freq: Two times a day (BID) | RESPIRATORY_TRACT | Status: DC
  Administered 2023-10-06 – 2023-10-09 (×6): 0.5 mg via RESPIRATORY_TRACT
  Filled 2023-10-06 (×6): qty 2.5

## 2023-10-06 MED ORDER — SODIUM CHLORIDE 0.9 % IV SOLN
2.0000 g | INTRAVENOUS | Status: DC
  Administered 2023-10-06: 2 g via INTRAVENOUS
  Filled 2023-10-06: qty 20

## 2023-10-06 MED ORDER — ADULT MULTIVITAMIN W/MINERALS CH
1.0000 | ORAL_TABLET | Freq: Every day | ORAL | Status: DC
  Administered 2023-10-07 – 2023-10-09 (×3): 1 via ORAL
  Filled 2023-10-06 (×3): qty 1

## 2023-10-06 MED ORDER — ENSURE MAX PROTEIN PO LIQD
11.0000 [oz_av] | Freq: Two times a day (BID) | ORAL | Status: DC
  Administered 2023-10-07 – 2023-10-09 (×5): 11 [oz_av] via ORAL
  Filled 2023-10-06: qty 330

## 2023-10-06 MED ORDER — LEVALBUTEROL HCL 1.25 MG/0.5ML IN NEBU
1.2500 mg | INHALATION_SOLUTION | Freq: Two times a day (BID) | RESPIRATORY_TRACT | Status: DC
  Administered 2023-10-06 – 2023-10-09 (×6): 1.25 mg via RESPIRATORY_TRACT
  Filled 2023-10-06 (×7): qty 0.5

## 2023-10-06 NOTE — Progress Notes (Signed)
1      PROGRESS NOTE    Christian Fields  ZOX:[id number] DOB: 07-30-1956 DOA: 10/04/2023 PCP: Alease Medina, MD    Brief Narrative:   67 y.o. male with medical history significant for COPD Gold stage IV, chronic hypoxic respiratory failure on 4 L continuously, HTN, HLD, IIDM, OSA not on CPAP, PVD on aspirin Plavix chronic pain syndrome on Suboxone non-small cell lung carcinoma status post treatment, COPD stage II, chronic HFpEF, diabetic neuropathy admitted for worsening shortness of breath  10/27: DC telemetry, transfer to any MedSurg, podiatry and palliative care consult 10/28: MRI of the left heel shows Acute osteomyelitis of the posterior calcaneus.  ID and pulmonary consult.  Started IV Rocephin.  Nonweightbearing on left foot   Assessment & Plan:   Principal Problem:   Respiratory distress Active Problems:   SOB (shortness of breath)   Acute on chronic respiratory failure with hypoxia (HCC)   COPD with acute exacerbation (HCC)   Tobacco use disorder   Chronic heart failure with preserved ejection fraction (HFpEF) (HCC)   NSCLC of left lung (HCC)   Insulin dependent type 2 diabetes mellitus (HCC)   Anemia  Chronic respiratory failure with hypoxia (HCC) Due to underlying COPD chronically on 4 l/min O2.  Initially required BiPAP while in the ED and now on 4 L oxygen Of note patient was recently at [facility] and discharged after 23 days of hospitalization per patient, he was discharged to [facility] healthcare rehab. --Supplement O2, wean as tolerated --Target spO2 > 88--92% -- Continue steroid therapy --Changed Duonebs & albuterol to Ipratropium and Xopenex on account of tachycardia -- Continue DALIRESP  --Weaned off bipap - stable to transfer to any MedSurg floor --Pulmonary consult with Dr. Meredeth Ide --Follow blood and sputum cultures --Mucinex & other supportive care per orders   Hypermagnesemia Magnesium of 3.1, could be iatrogenic.  Will monitor for now    Chronic heart failure with preserved ejection fraction (HFpEF) (HCC) Well compensated for now.  LVEF of 55 to 60%   PVD (peripheral vascular disease) (HCC) On ASA, Plavix   Chronic pain syndrome Home regimen - Suboxone TID, gabapentin 800 mg PO QID Had to cut back on gabapentin dose to 600 mg p.o. 3 times daily due to renal function.  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 Follows with Dr. Ether Griffins - likely missed outpatient appointment as he was admitted at [facility].   --Non-weight-bearing on LLE -- Dr. Ether Griffins will see him tomorrow on 10/29.  --WOC consulted for wound care  -MRI of the left heel/ankle shows acute osteomyelitis of the left calcaneus -Add IV Rocephin and consult infectious disease   Insulin dependent type 2 diabetes mellitus (HCC) Sliding scale Novolog for now   Anemia of chronic disease Stable H&H  Anxiety and depression Continue Cymbalta Will add Klonopin as he was somewhat tremulous    DVT prophylaxis:  heparin injection 5,000 Units Start: 10/05/23 1400     Code Status: Full code Family Communication: Updated his sister and brother in law at bedside Disposition Plan: Possible discharge to home with home health in next 3-4 days depending on clinical condition, podiatry, pulmonary and ID evaluation   Consultants:  Podiatry Pulmonary ID Palliative care     Subjective:  He is somewhat tremulous today sitting in the chair.  He is hoping to see Dr. Meredeth Ide while  here.  Objective: Vitals:   10/06/23 0743 10/06/23 0947 10/06/23 1401 10/06/23 1450  BP: 121/74 127/70  126/72  Pulse: 83 91  96  Resp: 20 20  18   Temp: 98 F (36.7 C) 97.9 F (36.6 C)  98.4 F (36.9 C)  TempSrc: Oral   Oral  SpO2: 97% 98% 92% 94%  Weight:      Height:        Intake/Output Summary (Last 24 hours) at  10/06/2023 1548 Last data filed at 10/06/2023 1300 Gross per 24 hour  Intake 240 ml  Output 700 ml  Net -460 ml   Filed Weights   10/04/23 1646 10/06/23 0434  Weight: 75.7 kg 75 kg    Examination:  General exam: Appears calm and comfortable  Respiratory system: Clear to auscultation. Respiratory effort normal.  Using 4 L oxygen via nasal cannula Cardiovascular system: S1 & S2 heard, RRR. No JVD, murmurs, rubs, gallops or clicks. No pedal edema. Gastrointestinal system: Abdomen is soft, benign Central nervous system: Alert and oriented. No focal neurological deficits.  Both hands has tremor at rest Extremities: Symmetric 5 x 5 power. Skin: Left heel full-thickness ulcer with callus Psychiatry: Judgement and insight appear normal. Mood & affect appropriate.     Data Reviewed: I have personally reviewed following labs and imaging studies  CBC: Recent Labs  Lab 10/04/23 1649 10/06/23 0553  WBC 11.1* 21.1*  NEUTROABS 6.5  --   HGB 10.1* 8.8*  HCT 33.2* 29.1*  MCV 92.2 91.5  PLT 293 294   Basic Metabolic Panel: Recent Labs  Lab 10/04/23 1649 10/06/23 0553  NA 134* 138  K 3.5 4.1  CL 97* 101  CO2 30 30  GLUCOSE 165* 151*  BUN 21 33*  CREATININE 1.29* 1.31*  CALCIUM 9.0 8.3*  MG 3.1*  --    GFR: Estimated Creatinine Clearance: 50.5 mL/min (A) (by C-G formula based on SCr of 1.31 mg/dL (H)). Liver Function Tests: Recent Labs  Lab 10/04/23 1649  AST 28  ALT 20  ALKPHOS 95  BILITOT 0.5  PROT 7.7  ALBUMIN 3.2*    Sepsis Labs: Recent Labs  Lab 10/04/23 1649  PROCALCITON <0.10  LATICACIDVEN 1.9    Recent Results (from the past 240 hour(s))  Blood culture (routine x 2)     Status: None (Preliminary result)   Collection Time: 10/04/23  4:49 PM   Specimen: BLOOD  Result Value Ref Range Status   Specimen Description BLOOD RIGHT ANTECUBITAL  Final   Special Requests   Final    BOTTLES DRAWN AEROBIC AND ANAEROBIC Blood Culture results may not be optimal  due to an excessive volume of blood received in culture bottles   Culture   Final    NO GROWTH 2 DAYS Performed at [facility], [address], [place], [place] [address]    Report Status PENDING  Incomplete  Blood culture (routine x 2)     Status: None (Preliminary result)   Collection Time: 10/04/23  4:49 PM   Specimen: BLOOD  Result Value Ref Range Status   Specimen Description BLOOD BLOOD RIGHT HAND  Final   Special Requests   Final    BOTTLES DRAWN AEROBIC AND ANAEROBIC Blood Culture adequate volume   Culture   Final    NO GROWTH 2 DAYS Performed at [facility], [address], [place], [place] [address]    Report Status PENDING  Incomplete  SARS Coronavirus 2 by RT PCR (hospital order, performed in  [facility] hospital lab) *cepheid single result test* Anterior Nasal Swab     Status: None   Collection Time: 10/04/23  4:49 PM   Specimen: Anterior Nasal Swab  Result Value Ref Range Status   SARS Coronavirus 2 by RT PCR NEGATIVE NEGATIVE Final    Comment: (NOTE) SARS-CoV-2 target nucleic acids are NOT DETECTED.  The SARS-CoV-2 RNA is generally detectable in upper and lower respiratory specimens during the acute phase of infection. The lowest concentration of SARS-CoV-2 viral copies this assay can detect is 250 copies / mL. A negative result does not preclude SARS-CoV-2 infection and should not be used as the sole basis for treatment or other patient management decisions.  A negative result may occur with improper specimen collection / handling, submission of specimen other than nasopharyngeal swab, presence of viral mutation(s) within the areas targeted by this assay, and inadequate number of viral copies (<250 copies / mL). A negative result must be combined with clinical observations, patient history, and epidemiological information.  Fact Sheet for Patients:   RoadLapTop.co.za  Fact Sheet for Healthcare  Providers: http://kim-miller.com/  This test is not yet approved or  cleared by the [place] FDA and has been authorized for detection and/or diagnosis of SARS-CoV-2 by FDA under an Emergency Use Authorization (EUA).  This EUA will remain in effect (meaning this test can be used) for the duration of the COVID-19 declaration under Section 564(b)(1) of the Act, 21 U.S.C. section 360bbb-3(b)(1), unless the authorization is terminated or revoked sooner.  Performed at [facility], [address] [place], [place] [address]          Radiology Studies: MR HEEL LEFT W WO CONTRAST  Result Date: 10/06/2023 CLINICAL DATA:  Left hindfoot osteomyelitis status post debridement on 07/11/2023 EXAM: MRI OF LOWER LEFT EXTREMITY WITHOUT AND WITH CONTRAST TECHNIQUE: Multiplanar, multisequence MR imaging of the left hindfoot was performed both before and after administration of intravenous contrast. CONTRAST:  7mL GADAVIST GADOBUTROL 1 MMOL/ML IV SOLN COMPARISON:  X-ray 07/02/2023, MRI 06/23/2023 FINDINGS: Technical Note: Despite efforts by the technologist and patient, motion artifact is present on today's exam and could not be eliminated. This reduces exam sensitivity and specificity. Bones/Joint/Cartilage Postsurgical changes from prior partial calcaneal resection with excision of the plantar calcaneal spur. Along the plantar surface of the posterior calcaneus is a area of focal bone marrow edema and enhancement with confluent low T1 marrow signal compatible with acute osteomyelitis (series 5, image 12). No additional sites of osteomyelitis are identified. No tibiotalar or or subtalar joint effusion to suggest septic arthritis. No acute fracture. 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 Ligaments Lateral ankle ligaments are chronically torn with thickened joint capsule.  No evidence to suggest an acute ligamentous injury. Muscles and Tendons Denervation changes of the lower leg and foot musculature. No tenosynovitis. Soft tissues Plantar soft tissue ulceration. Edematous tissue extends to the underlying cortex of the posterior calcaneus. No organized fluid collections. IMPRESSION: 1. Acute osteomyelitis of the posterior calcaneus. Underlying plantar soft tissue ulceration. 2. 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 Electronically Signed   By: Duanne Guess D.O.   On: 10/06/2023 13:33   CT CHEST W CONTRAST  Result Date: 10/04/2023 CLINICAL DATA:  Short of breath, COPD exacerbation, respiratory distress EXAM: CT CHEST WITH CONTRAST TECHNIQUE: Multidetector CT imaging of the chest was performed during intravenous contrast administration. RADIATION DOSE  REDUCTION: This exam was performed according to the departmental dose-optimization program which includes automated exposure control, adjustment of the mA and/or kV according to patient size and/or use of iterative reconstruction technique. CONTRAST:  75mL OMNIPAQUE IOHEXOL 300 MG/ML  SOLN COMPARISON:  08/26/2023, 08/30/2023, 10/04/2023 FINDINGS: Cardiovascular: The heart is unremarkable without pericardial effusion. Dilated main pulmonary arteries consistent with pulmonary arterial hypertension, stable. No evidence of thoracic aortic aneurysm or dissection. Atherosclerosis of the aorta and coronary vasculature again noted. Mediastinum/Nodes: Thyroid, trachea, and esophagus are unremarkable. Stable mediastinal and hilar adenopathy. Index precarinal lymph node measures up to 15 mm reference image 70/2, unchanged since prior exam. Lungs/Pleura: Diffuse emphysema again noted. Stable subpleural masslike density within the left upper lobe measuring 1.6 x 2.0 cm reference image 50/2, likely site of prior treated lung cancer. Interval enlargement of the left upper lobe nodularity seen on prior  exam, now measuring 17 x 11 mm reference image 51/3, previously measuring 7 x 6 mm on the 08/26/2023 exam. The punctate subpleural nodules within the right middle lobe on prior study are unchanged. No new areas of airspace disease, effusion, or pneumothorax. Chronic areas of scarring at the lung bases, most pronounced within the lingula. Central airways are patent. Upper Abdomen: No acute abnormality. Musculoskeletal: No acute or destructive bony abnormalities. Stable mild wedge compression deformity of the T5 vertebral body. Stable spondylosis at the thoracolumbar junction. Reconstructed images demonstrate no additional findings. IMPRESSION: 1. Progressive enlargement of the right upper lobe nodule seen on prior exam, now measuring 17 x 11 mm. While the patient has a history of waxing and waning pulmonary nodularity presumed to be inflammatory or infectious, the persistence since the 08/30/2023 exam is somewhat concerning. PET-CT or interval 3 month follow-up chest CT are recommended for further evaluation. 2. Stable mediastinal and hilar adenopathy, nonspecific. This could also be re-evaluated at the time of follow-up imaging. 3. Stable post therapeutic changes within the left upper lobe, consistent with history of treated lung cancer. 4. Aortic Atherosclerosis (ICD10-I70.0) and Emphysema (ICD10-J43.9). 5. Stable enlarged pulmonary arteries consistent with pulmonary arterial hypertension. Electronically Signed   By: Sharlet Salina M.D.   On: 10/04/2023 21:11   DG Chest Portable 1 View  Result Date: 10/04/2023 CLINICAL DATA:  SOB EXAM: PORTABLE CHEST 1 VIEW COMPARISON:  August 30, 2023 FINDINGS: The cardiomediastinal silhouette is unchanged in contour.Atherosclerotic calcifications. No pleural effusion. No pneumothorax. Similar appearance of bilateral nodular opacities, better evaluated on recent chest CT. RIGHT-sided predominant bronchitic markings and mild bronchial wall cuffing. Emphysematous changes.  IMPRESSION: 1. RIGHT-sided predominant bronchitic markings and mild bronchial wall cuffing. This could reflect bronchitis. 2. Similar appearance of bilateral nodular opacities, better evaluated on recent chest CT. Electronically Signed   By: Meda Klinefelter M.D.   On: 10/04/2023 17:31        Scheduled Meds:  aspirin EC  81 mg Oral Daily   buprenorphine-naloxone  1 tablet Sublingual TID   clonazepam  1 mg Oral BID   clopidogrel  75 mg Oral Daily   DULoxetine  30 mg Oral Daily   gabapentin  600 mg Oral TID   heparin  5,000 Units Subcutaneous Q8H   insulin aspart  0-5 Units Subcutaneous QHS   insulin aspart  0-9 Units Subcutaneous TID WC   ipratropium  0.5 mg Nebulization BID   levalbuterol  1.25 mg Nebulization BID   metoprolol succinate  50 mg Oral Daily   predniSONE  40 mg Oral Q breakfast   roflumilast  500 mcg  Oral Daily   sodium chloride flush  3 mL Intravenous Q12H   thiamine  100 mg Oral Daily   Continuous Infusions:  cefTRIAXone (ROCEPHIN)  IV       LOS: 2 days    Time spent: 35 minutes    Delfino Lovett, MD [facility] Pager [phone]  If 7PM-7AM, please contact night-coverage [email or web address] Password [facility] 10/06/2023, 3:48 PM

## 2023-10-06 NOTE — Progress Notes (Signed)
Physical Therapy Treatment Patient Details Name: Christian Fields MRN: [id number] DOB: 1956/05/27 Today's Date: 10/06/2023   History of Present Illness Pt is a 67 y/o M admitted on 10/04/23 after presenting with c/o worsening cough, SOB, & respiratory distress. PMH: COPD Gold Stage IV, chronic hypoxic respiratory failure on 4L O2, HTN, HLD, DM2, OSA not on CPAP, PVD on aspirin, chronic pain syndrome, non-small lunch carcinoma s/p treatment, chronic HFpEF, diabetic neuropathy, anxiety    PT Comments  Pt was pleasant and motivated to participate during the session and put forth good effort throughout. Pt required no physical assistance with bed mobility or gait and only min A with transfers. Pt's SpO2 remained at 88% or above throughout session while on 6L O2. Pt reported no increase in pain or adverse symptoms during session other than mild SOB noted during gait training that resolved quickly once back in sitting. Pt will benefit from continued PT services upon discharge to safely address deficits listed in patient problem list for decreased caregiver assistance and eventual return to PLOF.    If plan is discharge home, recommend the following: A lot of help with walking and/or transfers;A little help with bathing/dressing/bathroom;Assistance with cooking/housework;Direct supervision/assist for medications management;Help with stairs or ramp for entrance;Assist for transportation   Can travel by private vehicle     No  Equipment Recommendations  Other (comment) (would benefit from a BSC if remains NWB at d/c)    Recommendations for Other Services       Precautions / Restrictions Precautions Precautions: Fall Restrictions Weight Bearing Restrictions: Yes LLE Weight Bearing: Non weight bearing Other Position/Activity Restrictions: Per chart review, podiatry to reassess 10/29; per MD note 4 L O2 at rest, 6 L with activity     Mobility  Bed Mobility Overal bed mobility: Independent Bed  Mobility: Sit to Supine       Sit to supine: Independent   General bed mobility comments: good speed and effort with no need for bedrail use    Transfers Overall transfer level: Needs assistance Equipment used: Rolling walker (2 wheels) Transfers: Sit to/from Stand Sit to Stand: Contact guard assist, Min assist           General transfer comment: Multimodal cuing given for WB compliance, hand placement, increased trunk flexion    Ambulation/Gait Ambulation/Gait assistance: Contact guard assist Gait Distance (Feet): 6 feet x2, 10 feet x1 Assistive device: Rolling walker (2 wheels) Gait Pattern/deviations:  (hop-to gait pattern) Gait velocity: decreased     General Gait Details: Multimodal cuing for general sequencing   Stairs             Wheelchair Mobility     Tilt Bed    Modified Rankin (Stroke Patients Only)       Balance Overall balance assessment: Needs assistance Sitting-balance support: Bilateral upper extremity supported, Feet supported Sitting balance-Leahy Scale: Normal     Standing balance support: Bilateral upper extremity supported, During functional activity Standing balance-Leahy Scale: Poor Standing balance comment: Occasional min A to prevent posterior LOB upon initial stand                            Cognition Arousal: Alert Behavior During Therapy: WFL for tasks assessed/performed, Impulsive Overall Cognitive Status: Within Functional Limits for tasks assessed  Exercises      General Comments        Pertinent Vitals/Pain Pain Assessment Pain Assessment: 0-10 Pain Score: 2  Pain Location: BUE/BLE 2/2 neuropathy Pain Intervention(s): Repositioned, Monitored during session    Home Living                          Prior Function            PT Goals (current goals can now be found in the care plan section) Progress towards PT goals:  Progressing toward goals    Frequency    Min 1X/week      PT Plan      Co-evaluation              AM-PAC PT "6 Clicks" Mobility   Outcome Measure  Help needed turning from your back to your side while in a flat bed without using bedrails?: None Help needed moving from lying on your back to sitting on the side of a flat bed without using bedrails?: None Help needed moving to and from a bed to a chair (including a wheelchair)?: A Little Help needed standing up from a chair using your arms (e.g., wheelchair or bedside chair)?: A Little Help needed to walk in hospital room?: A Little Help needed climbing 3-5 steps with a railing? : A Lot 6 Click Score: 19    End of Session Equipment Utilized During Treatment: Gait belt;Oxygen Activity Tolerance: Patient tolerated treatment well Patient left: in bed;with call bell/phone within reach;with bed alarm set Nurse Communication: Mobility status PT Visit Diagnosis: Other abnormalities of gait and mobility (R26.89)     Time: 8413-2440 PT Time Calculation (min) (ACUTE ONLY): 37 min  Charges:                           Rosiland Oz SPT 10/06/23, 5:31 PM

## 2023-10-06 NOTE — Plan of Care (Signed)
PMT consult noted for GOC. In to see patient; currently another specialty is in with patient at this time. Will follow up tomorrow.

## 2023-10-06 NOTE — Plan of Care (Signed)
  Problem: Education: Goal: Knowledge of disease or condition will improve Outcome: Progressing Goal: Knowledge of the prescribed therapeutic regimen will improve Outcome: Progressing Goal: Individualized Educational Video(s) Outcome: Progressing   Problem: Activity: Goal: Ability to tolerate increased activity will improve Outcome: Progressing Goal: Will verbalize the importance of balancing activity with adequate rest periods Outcome: Progressing   Problem: Respiratory: Goal: Ability to maintain a clear airway will improve Outcome: Progressing Goal: Levels of oxygenation will improve Outcome: Progressing Goal: Ability to maintain adequate ventilation will improve Outcome: Progressing   Problem: Education: Goal: Knowledge of General Education information will improve Description: Including pain rating scale, medication(s)/side effects and non-pharmacologic comfort measures Outcome: Progressing   Problem: Health Behavior/Discharge Planning: Goal: Ability to manage health-related needs will improve Outcome: Progressing   Problem: Clinical Measurements: Goal: Ability to maintain clinical measurements within normal limits will improve Outcome: Progressing Goal: Will remain free from infection Outcome: Progressing Goal: Diagnostic test results will improve Outcome: Progressing Goal: Respiratory complications will improve Outcome: Progressing Goal: Cardiovascular complication will be avoided Outcome: Progressing   Problem: Activity: Goal: Risk for activity intolerance will decrease Outcome: Progressing   Problem: Nutrition: Goal: Adequate nutrition will be maintained Outcome: Progressing   Problem: Coping: Goal: Level of anxiety will decrease Outcome: Progressing   Problem: Elimination: Goal: Will not experience complications related to bowel motility Outcome: Progressing Goal: Will not experience complications related to urinary retention Outcome: Progressing    Problem: Pain Management: Goal: General experience of comfort will improve Outcome: Progressing   Problem: Safety: Goal: Ability to remain free from injury will improve Outcome: Progressing   Problem: Skin Integrity: Goal: Risk for impaired skin integrity will decrease Outcome: Progressing   Problem: Education: Goal: Ability to describe self-care measures that may prevent or decrease complications (Diabetes Survival Skills Education) will improve Outcome: Progressing Goal: Individualized Educational Video(s) Outcome: Progressing   Problem: Coping: Goal: Ability to adjust to condition or change in health will improve Outcome: Progressing   Problem: Fluid Volume: Goal: Ability to maintain a balanced intake and output will improve Outcome: Progressing   Problem: Health Behavior/Discharge Planning: Goal: Ability to identify and utilize available resources and services will improve Outcome: Progressing Goal: Ability to manage health-related needs will improve Outcome: Progressing   Problem: Metabolic: Goal: Ability to maintain appropriate glucose levels will improve Outcome: Progressing   Problem: Nutritional: Goal: Maintenance of adequate nutrition will improve Outcome: Progressing Goal: Progress toward achieving an optimal weight will improve Outcome: Progressing   Problem: Skin Integrity: Goal: Risk for impaired skin integrity will decrease Outcome: Progressing   Problem: Tissue Perfusion: Goal: Adequacy of tissue perfusion will improve Outcome: Progressing

## 2023-10-06 NOTE — Consult Note (Signed)
NAME: Christian Fields  DOB: 08-01-1956  MRN: [id number]  Date/Time: 10/06/2023 3:04 PM  REQUESTING PROVIDER Dr. Sherryll Burger Subjective:  REASON FOR CONSULT: left foot wound Christian Fields ? Christian Fields is a 67 y.o. with a history of COPD, HTN HLD, DM, Chronic left foot ulcer, Non small cell ca, Polysusbatnce use, CKD  PAD,  angioplasty of LT SFA, and stent placement, left foot ulcer left calcaneal osteo, . Debridement full thickness ulcer on 07/11/23 and sent on linezolid + cipro for 2 weeks , hyperlacataemeia and thrombocytopenia to linezolid which resolved after Dcing linezolid, I last saw him in virtually on 08/12/23,  and was off antibiotics and followed by Dr.Fowler , was in [facility] 10/1-10/23/24 with acute on chronic resp failure and hypoxia/hypercapnia, COPD exacerbation, stenotrophomonas in sputum culture and was given levaquin  was sent to [facility] to the ED on 10.26 from [facility] with Resp distress. EMS gave him solumedrol and magnesium IV  10/04/23 16:43  BP 116/77  Pulse Rate 90  Resp 26 !  SpO2 98 %  Temp 98.4  Labs  Latest Reference Range & Units 10/04/23 16:49  WBC 4.0 - 10.5 K/uL 11.1 (H)  Hemoglobin 13.0 - 17.0 g/dL 84.1 (L)  HCT 66.0 - 63.0 % 33.2 (L)  Platelets 150 - 400 K/uL 293  Creatinine 0.61 - 1.24 mg/dL 1.60 (H)   Pt has had a chronic left foot wound for more than a year and in Aug 2024 underwent partial calcanela debridement and was treated with antibiotics- linezolid and cipro and had thrombocytopenia and hyperlactatemia due to linezolid Pt is doing well from the foot perspective Says being off his feet has helped with wound healing He used to have wound vac and then packing- now nothing as wound is almost healed I am asked to see the patient as MRI done yesterday showed acute osteomyelitis of the calcaneum  Son at bed side   Past Medical History:  Diagnosis Date   Anemia    Anxiety    Arthritis    Asthma    Cancer (HCC)    Basal Cell Skin Cancer   Chronic back pain     COPD (chronic obstructive pulmonary disease) (HCC)    Depression    Diabetes mellitus (HCC)    Dyspnea    GERD (gastroesophageal reflux disease)    Gout    Gout    Headache    History of blood clots    Left Leg--July 2018   History of kidney stones    Hyperlipidemia    Hyperlipidemia    Hypertension    Kidney stones    Neuropathy    On home oxygen therapy    2 L / M   Pneumonia 06/2017   Sleep apnea    Ulcer of foot (HCC)    Right    Past Surgical History:  Procedure Laterality Date   APPENDECTOMY     DG FEET 2 VIEWS BILAT     IRRIGATION AND DEBRIDEMENT FOOT Left 07/11/2023   Procedure: IRRIGATION AND DEBRIDEMENT FOOT;  Surgeon: Gwyneth Revels, DPM;  Location: [facility] ORS;  Service: Orthopedics/Podiatry;  Laterality: Left;   LIPOMA EXCISION Right 08/15/2017   Procedure: EXCISION TUMOR(CYST) FOOT;  Surgeon: Recardo Evangelist, DPM;  Location: [facility] ORS;  Service: Podiatry;  Laterality: Right;   LOWER EXTREMITY ANGIOGRAPHY Left 07/03/2023   Procedure: Lower Extremity Angiography;  Surgeon: Annice Needy, MD;  Location: [facility] INVASIVE CV LAB;  Service: Cardiovascular;  Laterality: Left;   OTHER SURGICAL HISTORY  Bilateral Foot surgery    Social History   Socioeconomic History   Marital status: Widowed    Spouse name: Not on file   Number of children: Not on file   Years of education: Not on file   Highest education level: Not on file  Occupational History   Not on file  Tobacco Use   Smoking status: Former    Current packs/day: 0.50    Average packs/day: 0.5 packs/day for 6.8 years (3.4 ttl pk-yrs)    Types: Cigarettes    Start date: 2018   Smokeless tobacco: Never  Vaping Use   Vaping status: Never Used  Substance and Sexual Activity   Alcohol use: Yes    Alcohol/week: 1.0 standard drink of alcohol    Types: 1 Cans of beer per week    Comment: occ   Drug use: No   Sexual activity: Not Currently  Other Topics Concern   Not on file  Social History Narrative   Not on file    Social Determinants of Health   Financial Resource Strain: Low Risk  (04/19/2023)   Received from [facility], [facility]   Overall Financial Resource Strain (CARDIA)    Difficulty of Paying Living Expenses: Not hard at all  Food Insecurity: No Food Insecurity (10/05/2023)   Hunger Vital Sign    Worried About Running Out of Food in the Last Year: Never true    Ran Out of Food in the Last Year: Never true  Transportation Needs: No Transportation Needs (10/05/2023)   PRAPARE - Administrator, Civil Service (Medical): No    Lack of Transportation (Non-Medical): No  Physical Activity: Not on file  Stress: Not on file  Social Connections: Not on file  Intimate Partner Violence: Not At Risk (10/05/2023)   Humiliation, Afraid, Rape, and Kick questionnaire    Fear of Current or Ex-Partner: No    Emotionally Abused: No    Physically Abused: No    Sexually Abused: No    Family History  Problem Relation Age of Onset   Hypertension Mother    Allergies  Allergen Reactions   Bee Venom Anaphylaxis   Linezolid Other (See Comments)    Thrombocytopenia, hyperlactatemia   Azithromycin Itching and Rash   I? Current Facility-Administered Medications  Medication Dose Route Frequency Provider Last Rate Last Admin   acetaminophen (TYLENOL) tablet 650 mg  650 mg Oral Q6H PRN Gertha Calkin, MD       Or   acetaminophen (TYLENOL) suppository 650 mg  650 mg Rectal Q6H PRN Gertha Calkin, MD       albuterol (PROVENTIL) (2.5 MG/3ML) 0.083% nebulizer solution 2.5 mg  2.5 mg Inhalation Q4H PRN Gertha Calkin, MD       aspirin EC tablet 81 mg  81 mg Oral Daily Irena Cords V, MD   81 mg at 10/06/23 0851   buprenorphine-naloxone (SUBOXONE) 8-2 mg per SL tablet 1 tablet  1 tablet Sublingual TID Delfino Lovett, MD   1 tablet at 10/06/23 5409   cefTRIAXone (ROCEPHIN) 2 g in sodium chloride 0.9 % 100 mL IVPB  2 g Intravenous Q24H Delfino Lovett, MD       clonazePAM (KLONOPIN) disintegrating  tablet 1 mg  1 mg Oral BID Delfino Lovett, MD       clopidogrel (PLAVIX) tablet 75 mg  75 mg Oral Daily Gertha Calkin, MD   75 mg at 10/06/23 0852   DULoxetine (CYMBALTA) DR capsule  30 mg  30 mg Oral Daily Gertha Calkin, MD   30 mg at 10/06/23 0851   gabapentin (NEURONTIN) capsule 600 mg  600 mg Oral TID Delfino Lovett, MD   600 mg at 10/06/23 0851   guaiFENesin (MUCINEX) 12 hr tablet 600 mg  600 mg Oral BID PRN Gertha Calkin, MD       heparin injection 5,000 Units  5,000 Units Subcutaneous Q8H Delfino Lovett, MD   5,000 Units at 10/06/23 1308   hydrALAZINE (APRESOLINE) injection 5 mg  5 mg Intravenous Q4H PRN Gertha Calkin, MD       insulin aspart (novoLOG) injection 0-5 Units  0-5 Units Subcutaneous QHS Delfino Lovett, MD   2 Units at 10/05/23 2200   insulin aspart (novoLOG) injection 0-9 Units  0-9 Units Subcutaneous TID WC Delfino Lovett, MD   1 Units at 10/06/23 1230   ipratropium (ATROVENT) nebulizer solution 0.5 mg  0.5 mg Nebulization BID Delfino Lovett, MD       levalbuterol Pauline Aus) nebulizer solution 1.25 mg  1.25 mg Nebulization BID Delfino Lovett, MD       metoprolol succinate (TOPROL-XL) 24 hr tablet 50 mg  50 mg Oral Daily Gertha Calkin, MD   50 mg at 10/06/23 2841   predniSONE (DELTASONE) tablet 40 mg  40 mg Oral Q breakfast Ronnald Ramp, RPH   40 mg at 10/06/23 3244   roflumilast (DALIRESP) tablet 500 mcg  500 mcg Oral Daily Gertha Calkin, MD   500 mcg at 10/06/23 0102   sodium chloride flush (NS) 0.9 % injection 3 mL  3 mL Intravenous Q12H Gertha Calkin, MD   3 mL at 10/05/23 2145   thiamine (VITAMIN B1) tablet 100 mg  100 mg Oral Daily Lowella Bandy, RPH   100 mg at 10/06/23 7253     Abtx:  Anti-infectives (From admission, onward)    Start     Dose/Rate Route Frequency Ordered Stop   10/06/23 1600  cefTRIAXone (ROCEPHIN) 2 g in sodium chloride 0.9 % 100 mL IVPB        2 g 200 mL/hr over 30 Minutes Intravenous Every 24 hours 10/06/23 1420         REVIEW OF SYSTEMS:  Const: negative  fever, negative chills, negative weight loss Eyes: negative diplopia or visual changes, negative eye pain ENT: negative coryza, negative sore throat Resp: as above Cards: negative for chest pain, palpitations, lower extremity edema GU: negative for frequency, dysuria and hematuria GI: Negative for abdominal pain, diarrhea, bleeding, constipation Skin: negative for rash and pruritus Heme: negative for easy bruising and gum/nose bleeding MS:  weakness Neurolo:negative for headaches, dizziness, vertigo, memory problems  Psych: negative for feelings of anxiety, depression  Endocrine: diabetes Allergy/Immunology- as above Objective:  VITALS:  BP 126/72 (BP Location: Right Arm)   Pulse 96   Temp 98.4 F (36.9 C) (Oral)   Resp 18   Ht 5\' 7"  (1.702 m)   Wt 75 kg   SpO2 94%   BMI 25.90 kg/m   PHYSICAL EXAM:  General: Alert, cooperative, no distress, appears stated age. Some resp distress Head: Normocephalic, without obvious abnormality, atraumatic. Eyes: Conjunctivae clear, anicteric sclerae. Pupils are equal ENT Nares normal. No drainage or sinus tenderness. Lips, mucosa, and tongue normal. No Thrush Neck: Supple, symmetrical, no adenopathy, thyroid: non tender no carotid bruit and no JVD. Back: No CVA tenderness. Lungs: b/l decreased air entry b/l Heart: Regular rate and rhythm, no murmur,  rub or gallop. Abdomen: Soft, non-tender,not distended. Bowel sounds normal. No masses Extremities: left foot wound has almost healed     07/10/23   Skin: as above Lymph: Cervical, supraclavicular normal. Neurologic: Grossly non-focal Pertinent Labs Lab Results CBC    Component Value Date/Time   WBC 21.1 (H) 10/06/2023 0553   RBC 3.18 (L) 10/06/2023 0553   HGB 8.8 (L) 10/06/2023 0553   HGB 13.9 05/17/2014 1424   HCT 29.1 (L) 10/06/2023 0553   HCT 44.6 03/31/2014 1815   PLT 294 10/06/2023 0553   PLT 261 03/31/2014 1815   MCV 91.5 10/06/2023 0553   MCV 96 03/31/2014 1815   MCH  27.7 10/06/2023 0553   MCHC 30.2 10/06/2023 0553   RDW 15.3 10/06/2023 0553   RDW 13.7 03/31/2014 1815   LYMPHSABS 3.3 10/04/2023 1649   LYMPHSABS 0.6 (L) 09/25/2013 0356   MONOABS 0.9 10/04/2023 1649   MONOABS 0.1 (L) 09/25/2013 0356   EOSABS 0.3 10/04/2023 1649   EOSABS 0.0 09/25/2013 0356   BASOSABS 0.1 10/04/2023 1649   BASOSABS 0.0 09/25/2013 0356       Latest Ref Rng & Units 10/06/2023    5:53 AM 10/04/2023    4:49 PM 09/04/2023    3:08 AM  CMP  Glucose 70 - 99 mg/dL 629  528  413   BUN 8 - 23 mg/dL 33  21  32   Creatinine 0.61 - 1.24 mg/dL 2.44  0.10  2.72   Sodium 135 - 145 mmol/L 138  134  139   Potassium 3.5 - 5.1 mmol/L 4.1  3.5  3.5   Chloride 98 - 111 mmol/L 101  97  100   CO2 22 - 32 mmol/L 30  30  28    Calcium 8.9 - 10.3 mg/dL 8.3  9.0  7.4   Total Protein 6.5 - 8.1 g/dL  7.7    Total Bilirubin 0.3 - 1.2 mg/dL  0.5    Alkaline Phos 38 - 126 U/L  95    AST 15 - 41 U/L  28    ALT 0 - 44 U/L  20        Microbiology: Recent Results (from the past 240 hour(s))  Blood culture (routine x 2)     Status: None (Preliminary result)   Collection Time: 10/04/23  4:49 PM   Specimen: BLOOD  Result Value Ref Range Status   Specimen Description BLOOD RIGHT ANTECUBITAL  Final   Special Requests   Final    BOTTLES DRAWN AEROBIC AND ANAEROBIC Blood Culture results may not be optimal due to an excessive volume of blood received in culture bottles   Culture   Final    NO GROWTH 2 DAYS Performed at [facility], [address] [place], [place] [address]    Report Status PENDING  Incomplete  Blood culture (routine x 2)     Status: None (Preliminary result)   Collection Time: 10/04/23  4:49 PM   Specimen: BLOOD  Result Value Ref Range Status   Specimen Description BLOOD BLOOD RIGHT HAND  Final   Special Requests   Final    BOTTLES DRAWN AEROBIC AND ANAEROBIC Blood Culture adequate volume   Culture   Final    NO GROWTH 2 DAYS Performed at [facility], [address] [place], [place] [address]    Report Status PENDING  Incomplete  SARS Coronavirus 2 by RT PCR (hospital order, performed in [facility] hospital lab) *cepheid single result test*  Anterior Nasal Swab     Status: None   Collection Time: 10/04/23  4:49 PM   Specimen: Anterior Nasal Swab  Result Value Ref Range Status   SARS Coronavirus 2 by RT PCR NEGATIVE NEGATIVE Final    Comment: (NOTE) SARS-CoV-2 target nucleic acids are NOT DETECTED.  The SARS-CoV-2 RNA is generally detectable in upper and lower respiratory specimens during the acute phase of infection. The lowest concentration of SARS-CoV-2 viral copies this assay can detect is 250 copies / mL. A negative result does not preclude SARS-CoV-2 infection and should not be used as the sole basis for treatment or other patient management decisions.  A negative result may occur with improper specimen collection / handling, submission of specimen other than nasopharyngeal swab, presence of viral mutation(s) within the areas targeted by this assay, and inadequate number of viral copies (<250 copies / mL). A negative result must be combined with clinical observations, patient history, and epidemiological information.  Fact Sheet for Patients:   RoadLapTop.co.za  Fact Sheet for Healthcare Providers: http://kim-miller.com/  This test is not yet approved or  cleared by the [place] FDA and has been authorized for detection and/or diagnosis of SARS-CoV-2 by FDA under an Emergency Use Authorization (EUA).  This EUA will remain in effect (meaning this test can be used) for the duration of the COVID-19 declaration under Section 564(b)(1) of the Act, 21 U.S.C. section 360bbb-3(b)(1), unless the authorization is terminated or revoked sooner.  Performed at [facility], [address] [place], [place] [address]     IMAGING RESULTS: I have personally reviewed  the films ? Impression/Recommendation ?Acute hypoxic respiratory failure secondary to COPD exacerbation  Leucocytosis due to high dose steroids Left foot ulcer is so much better than before- Dont see any active infection Eventhough MRI shows Acute osteomyelitis of calcaneum, clinically he is so much better than 3 months ago' No need for any antibiotics now  Anemia  PAD, stent Left SFA  Diabetes with peripheral neuropathy? ? Left foot ulcer- so much better- no antibiotic needed- DC ceftriaxone   _ ________________________________________________ Discussed with patient, and his son Note:  This document was prepared using Dragon voice recognition software and may include unintentional dictation errors.

## 2023-10-06 NOTE — Progress Notes (Signed)
Report called to 2c. RN Donald Pore, pt to transfer via wheelchair

## 2023-10-06 NOTE — Consult Note (Signed)
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   Wound type: neuropathic, infectious Pressure Injury POA: NA Measurement: photo in chart.  2 scabbed 0.3 cm lesions to plantar foot near heel. Scarring from healed wound.   Wound WUJ:[id number] Drainage (amount, consistency, odor) none noted Periwound: scarring Dressing procedure/placement/frequency: cleanse wounds to left plantar foot near heel with VASHE cleanser daily. Cover with foam dressing.  Will not follow at this time.  Please re-consult if needed.  Mike Gip MSN, RN, FNP-BC CWON Wound, Ostomy, Continence Nurse Outpatient [facility] [phone] [phone]

## 2023-10-06 NOTE — Consult Note (Addendum)
Pulmonary Medicine          Date: 10/06/2023,   MRN# [id number] Christian Fields 12/28/1955     AdmissionWeight: 75.7 kg                 CurrentWeight: 75 kg   Full note follow   CHIEF COMPLAINT:   Worsening shortness of breath   HISTORY OF PRESENT ILLNESS   This is  67 YEAR OLD MALE, ex smoker. He is here for shortness of breath in the setting of known severe copd, phx of elevated co2.  Came in with low sats, Was on bipap on admission.  Now on the regular floor. Sitting in the bedside chair. Speaking in full sentences.   He was at [facility] for copd exacerbation and chf for over twenty day, sent to rehab and 3 days later developed the above and sent her. .   IMPRESSION: chest cta 1. No evidence of pulmonary embolism. 2. Interval development of patchy peripheral nodular airspace process predominately within the right lung and only minimally in the left lung suggesting acute infection. 3. Stable sub pleural irregular masslike opacity measuring 1.6 x 1.9 cm thought to represent post treatment change in the region patient's original left lung cancer. 4. Mediastinal and bilateral hilar adenopathy slightly progressed compared to the prior exam and may be reactive to patient's suspected pulmonary infection as described above. 5. Aortic atherosclerosis. Atherosclerotic coronary artery disease. 6. Stable bilateral renal cysts.   Aortic Atherosclerosis (ICD10-I70.0) and Emphysema (ICD10-J43.9).     Electronically Signed   By: Elberta Fortis M.D.   On: 08/30/2023 13:04       PAST MEDICAL HISTORY   Past Medical History:  Diagnosis Date   Anemia    Anxiety    Arthritis    Asthma    Cancer (HCC)    Basal Cell Skin Cancer   Chronic back pain    COPD (chronic obstructive pulmonary disease) (HCC)    Depression    Diabetes mellitus (HCC)    Dyspnea    GERD (gastroesophageal reflux disease)    Gout    Gout    Headache    History of blood clots    Left Leg--July  2018   History of kidney stones    Hyperlipidemia    Hyperlipidemia    Hypertension    Kidney stones    Neuropathy    On home oxygen therapy    2 L / M   Pneumonia 06/2017   Sleep apnea    Ulcer of foot (HCC)    Right     SURGICAL HISTORY   Past Surgical History:  Procedure Laterality Date   APPENDECTOMY     DG FEET 2 VIEWS BILAT     IRRIGATION AND DEBRIDEMENT FOOT Left 07/11/2023   Procedure: IRRIGATION AND DEBRIDEMENT FOOT;  Surgeon: Gwyneth Revels, DPM;  Location: [facility] ORS;  Service: Orthopedics/Podiatry;  Laterality: Left;   LIPOMA EXCISION Right 08/15/2017   Procedure: EXCISION TUMOR(CYST) FOOT;  Surgeon: Recardo Evangelist, DPM;  Location: [facility] OR[facility];  Service: Podiatry;  Laterality: Right;   LOWER EXTREMITY ANGIOGRAPHY Left 07/03/2023   Procedure: Lower Extremity Angiography;  Surgeon: Annice Needy, MD;  Location: [facility];  Service: Cardiovascular;  Laterality: Left;   OTHER SURGICAL HISTORY Bilateral Foot surgery     FAMILY HISTORY   Family History  Problem Relation Age of Onset   Hypertension Mother      SOCIAL HISTORY   Social History  Tobacco Use   Smoking status: Former    Current packs/day: 0.50    Average packs/day: 0.5 packs/day for 6.8 years (3.4 ttl pk-yrs)    Types: Cigarettes    Start date: 2018   Smokeless tobacco: Never  Vaping Use   Vaping status: Never Used  Substance Use Topics   Alcohol use: Yes    Alcohol/week: 1.0 standard drink of alcohol    Types: 1 Cans of beer per week    Comment: occ   Drug use: No     MEDICATIONS    Home Medication:    Current Medication:  Current Facility-Administered Medications:    acetaminophen (TYLENOL) tablet 650 mg, 650 mg, Oral, Q6H PRN **OR** acetaminophen (TYLENOL) suppository 650 mg, 650 mg, Rectal, Q6H PRN, Allena Katz, Ekta V, MD   albuterol (PROVENTIL) (2.5 MG/3ML) 0.083% nebulizer solution 2.5 mg, 2.5 mg, Inhalation, Q4H PRN, Gertha Calkin, MD   aspirin EC tablet 81 mg, 81 mg, Oral,  Daily, Irena Cords V, MD, 81 mg at 10/06/23 0851   buprenorphine-naloxone (SUBOXONE) 8-2 mg per SL tablet 1 tablet, 1 tablet, Sublingual, TID, Delfino Lovett, MD, 1 tablet at 10/06/23 3086   clopidogrel (PLAVIX) tablet 75 mg, 75 mg, Oral, Daily, Irena Cords V, MD, 75 mg at 10/06/23 5784   DULoxetine (CYMBALTA) DR capsule 30 mg, 30 mg, Oral, Daily, Irena Cords V, MD, 30 mg at 10/06/23 0851   gabapentin (NEURONTIN) capsule 600 mg, 600 mg, Oral, TID, Delfino Lovett, MD, 600 mg at 10/06/23 0851   guaiFENesin (MUCINEX) 12 hr tablet 600 mg, 600 mg, Oral, BID PRN, Gertha Calkin, MD   heparin injection 5,000 Units, 5,000 Units, Subcutaneous, Q8H, Delfino Lovett, MD, 5,000 Units at 10/06/23 1308   hydrALAZINE (APRESOLINE) injection 5 mg, 5 mg, Intravenous, Q4H PRN, Gertha Calkin, MD   insulin aspart (novoLOG) injection 0-5 Units, 0-5 Units, Subcutaneous, QHS, Delfino Lovett, MD, 2 Units at 10/05/23 2200   insulin aspart (novoLOG) injection 0-9 Units, 0-9 Units, Subcutaneous, TID WC, Delfino Lovett, MD, 1 Units at 10/06/23 1230   ipratropium (ATROVENT) nebulizer solution 0.5 mg, 0.5 mg, Nebulization, BID, Delfino Lovett, MD   levalbuterol (XOPENEX) nebulizer solution 1.25 mg, 1.25 mg, Nebulization, BID, Sherryll Burger, Vipul, MD   metoprolol succinate (TOPROL-XL) 24 hr tablet 50 mg, 50 mg, Oral, Daily, Irena Cords V, MD, 50 mg at 10/06/23 0851   predniSONE (DELTASONE) tablet 40 mg, 40 mg, Oral, Q breakfast, Ronnald Ramp, RPH, 40 mg at 10/06/23 6962   roflumilast (DALIRESP) tablet 500 mcg, 500 mcg, Oral, Daily, Irena Cords V, MD, 500 mcg at 10/06/23 9528   sodium chloride flush (NS) 0.9 % injection 3 mL, 3 mL, Intravenous, Q12H, Irena Cords V, MD, 3 mL at 10/05/23 2145   thiamine (VITAMIN B1) tablet 100 mg, 100 mg, Oral, Daily, Lowella Bandy, RPH, 100 mg at 10/06/23 4132    ALLERGIES   Bee venom, Linezolid, and Azithromycin     REVIEW OF SYSTEMS    Review of Systems:  Gen:  Denies  fever, sweats, chills weigh loss   HEENT: Denies blurred vision, double vision, ear pain, eye pain, hearing loss, nose bleeds, sore throat Cardiac:  No dizziness, chest pain or heaviness, chest tightness,edema Resp:   Denies cough or sputum porduction, shortness of breath,wheezing, hemoptysis,  Gi: Denies swallowing difficulty, stomach pain, nausea or vomiting, diarrhea, constipation, bowel incontinence Gu:  Denies bladder incontinence, burning urine Ext:   Denies Joint pain, stiffness or swelling Skin: Denies  skin  rash, easy bruising or bleeding or hives Endoc:  Denies polyuria, polydipsia , polyphagia or weight change Psych:   Denies depression, insomnia or hallucinations   Other:  All other systems negative   VS: BP 127/70 (BP Location: Right Arm)   Pulse 91   Temp 97.9 F (36.6 C)   Resp 20   Ht 5\' 7"  (1.702 m)   Wt 75 kg   SpO2 98%   BMI 25.90 kg/m      PHYSICAL EXAM    GENERAL:NAD, no fevers, chills, no weakness no fatigue HEAD: Normocephalic, atraumatic.  EYES: Pupils equal, round, reactive to light. Extraocular muscles intact. No scleral icterus.  MOUTH: Moist mucosal membrane. Dentition intact. No abscess noted.  EAR, NOSE, THROAT: Clear without exudates. No external lesions.  NECK: Supple. No thyromegaly. No nodules. No JVD.  PULMONARY: Diffuse coarse rhonchi right sided +wheezes CARDIOVASCULAR: S1 and S2. Regular rate and rhythm. No murmurs, rubs, or gallops. No edema. Pedal pulses 2+ bilaterally.  GASTROINTESTINAL: Soft, nontender, nondistended. No masses. Positive bowel sounds. No hepatosplenomegaly.  MUSCULOSKELETAL: No swelling, clubbing, or edema. Range of motion full in all extremities.  NEUROLOGIC: Cranial nerves II through XII are intact. No gross focal neurological deficits. Sensation intact. Reflexes intact.  SKIN: No ulceration, lesions, rashes, or cyanosis. Skin warm and dry. Turgor intact.  PSYCHIATRIC: Mood, affect within normal limits. The patient is awake, alert and oriented x 3.  Insight, judgment intact.       IMAGING    CT CHEST W CONTRAST  Result Date: 10/04/2023 CLINICAL DATA:  Short of breath, COPD exacerbation, respiratory distress EXAM: CT CHEST WITH CONTRAST TECHNIQUE: Multidetector CT imaging of the chest was performed during intravenous contrast administration. RADIATION DOSE REDUCTION: This exam was performed according to the departmental dose-optimization program which includes automated exposure control, adjustment of the mA and/or kV according to patient size and/or use of iterative reconstruction technique. CONTRAST:  75mL OMNIPAQUE IOHEXOL 300 MG/ML  SOLN COMPARISON:  08/26/2023, 08/30/2023, 10/04/2023 FINDINGS: Cardiovascular: The heart is unremarkable without pericardial effusion. Dilated main pulmonary arteries consistent with pulmonary arterial hypertension, stable. No evidence of thoracic aortic aneurysm or dissection. Atherosclerosis of the aorta and coronary vasculature again noted. Mediastinum/Nodes: Thyroid, trachea, and esophagus are unremarkable. Stable mediastinal and hilar adenopathy. Index precarinal lymph node measures up to 15 mm reference image 70/2, unchanged since prior exam. Lungs/Pleura: Diffuse emphysema again noted. Stable subpleural masslike density within the left upper lobe measuring 1.6 x 2.0 cm reference image 50/2, likely site of prior treated lung cancer. Interval enlargement of the left upper lobe nodularity seen on prior exam, now measuring 17 x 11 mm reference image 51/3, previously measuring 7 x 6 mm on the 08/26/2023 exam. The punctate subpleural nodules within the right middle lobe on prior study are unchanged. No new areas of airspace disease, effusion, or pneumothorax. Chronic areas of scarring at the lung bases, most pronounced within the lingula. Central airways are patent. Upper Abdomen: No acute abnormality. Musculoskeletal: No acute or destructive bony abnormalities. Stable mild wedge compression deformity of the T5  vertebral body. Stable spondylosis at the thoracolumbar junction. Reconstructed images demonstrate no additional findings. IMPRESSION: 1. Progressive enlargement of the right upper lobe nodule seen on prior exam, now measuring 17 x 11 mm. While the patient has a history of waxing and waning pulmonary nodularity presumed to be inflammatory or infectious, the persistence since the 08/30/2023 exam is somewhat concerning. PET-CT or interval 3 month follow-up chest CT are recommended for further evaluation.  2. Stable mediastinal and hilar adenopathy, nonspecific. This could also be re-evaluated at the time of follow-up imaging. 3. Stable post therapeutic changes within the left upper lobe, consistent with history of treated lung cancer. 4. Aortic Atherosclerosis (ICD10-I70.0) and Emphysema (ICD10-J43.9). 5. Stable enlarged pulmonary arteries consistent with pulmonary arterial hypertension. Electronically Signed   By: Sharlet Salina M.D.   On: 10/04/2023 21:11   DG Chest Portable 1 View  Result Date: 10/04/2023 CLINICAL DATA:  SOB EXAM: PORTABLE CHEST 1 VIEW COMPARISON:  August 30, 2023 FINDINGS: The cardiomediastinal silhouette is unchanged in contour.Atherosclerotic calcifications. No pleural effusion. No pneumothorax. Similar appearance of bilateral nodular opacities, better evaluated on recent chest CT. RIGHT-sided predominant bronchitic markings and mild bronchial wall cuffing. Emphysematous changes. IMPRESSION: 1. RIGHT-sided predominant bronchitic markings and mild bronchial wall cuffing. This could reflect bronchitis. 2. Similar appearance of bilateral nodular opacities, better evaluated on recent chest CT. Electronically Signed   By: Meda Klinefelter M.D.   On: 10/04/2023 17:31    Spirometry showed very severe obstruction fvc 1.74 liters ( 46.6 % ), fev1 0.62 liters ( 20.9 %), ratio 25.9, exp flow volume loop is very delayed   ASSESSMENT/PLAN   This is an ex smoker who hase severe copd. Her with  copd exacerbation, complicated by anxiety, at risk for co2 retention. Presently seem to be stable on present regimen at rest. But desat precipitantly with activity.  He has abnormal changes on chest ct. Ptior hx of lung cancer s/p radiatioin. ? Old changes. Will followp on and out patient basis. Oncology also following. PLAN -ige, eos count, alpha-1-antitrypsin level and phenotype -4liter at rest, increase to 6 liters with activity -physical therapy -on d/c will ad to his regimen ohtuvayre -soluMEdrol 50 lv q 12 hrs -eguivalent to trelegy/albuterol -agree with dalersp -incentive spiro/ flutter valve -recommend bipap at night ( same setting as on admission, ) -close out patient observation  -further orders per above    Thank you for allowing me to participate in the care of this patient.   Patient/Family are satisfied with care plan and all questions have been answered.  This document was prepared using Dragon voice recognition software and may include unintentional dictation errors.     Ned Clines, M.D.  Division of Pulmonary & Critical Care Medicine  [facility]

## 2023-10-06 NOTE — TOC CM/SW Note (Signed)
Transfer from 2A Admitted from [facility] Per Kenney Houseman at [facility] patient is there for rehab. Patient will extreme risk for readmission score.  TOC to completed admission.   Per MD note Podiatry to see tomorrow due to osteo and ID consult

## 2023-10-07 DIAGNOSIS — Z7189 Other specified counseling: Secondary | ICD-10-CM | POA: Diagnosis not present

## 2023-10-07 DIAGNOSIS — R0603 Acute respiratory distress: Secondary | ICD-10-CM | POA: Diagnosis not present

## 2023-10-07 DIAGNOSIS — R0602 Shortness of breath: Secondary | ICD-10-CM | POA: Diagnosis not present

## 2023-10-07 DIAGNOSIS — J441 Chronic obstructive pulmonary disease with (acute) exacerbation: Secondary | ICD-10-CM | POA: Diagnosis not present

## 2023-10-07 LAB — BASIC METABOLIC PANEL
Anion gap: 7 (ref 5–15)
BUN: 34 mg/dL — ABNORMAL HIGH (ref 8–23)
CO2: 32 mmol/L (ref 22–32)
Calcium: 8.2 mg/dL — ABNORMAL LOW (ref 8.9–10.3)
Chloride: 100 mmol/L (ref 98–111)
Creatinine, Ser: 1.25 mg/dL — ABNORMAL HIGH (ref 0.61–1.24)
GFR, Estimated: >60 mL/min (ref >60)
Glucose, Bld: 128 mg/dL — ABNORMAL HIGH (ref 70–99)
Potassium: 3.7 mmol/L (ref 3.5–5.1)
Sodium: 139 mmol/L (ref 135–145)

## 2023-10-07 LAB — CBC
HCT: 30.4 % — ABNORMAL LOW (ref 39.0–52.0)
Hemoglobin: 9.1 g/dL — ABNORMAL LOW (ref 13.0–17.0)
MCH: 28.1 pg (ref 26.0–34.0)
MCHC: 29.9 g/dL — ABNORMAL LOW (ref 30.0–36.0)
MCV: 93.8 fL (ref 80.0–100.0)
Platelets: 309 10*3/uL (ref 150–400)
RBC: 3.24 MIL/uL — ABNORMAL LOW (ref 4.22–5.81)
RDW: 15.6 % — ABNORMAL HIGH (ref 11.5–15.5)
WBC: 16.1 10*3/uL — ABNORMAL HIGH (ref 4.0–10.5)
nRBC: 0 % (ref 0.0–0.2)

## 2023-10-07 LAB — GLUCOSE, CAPILLARY
Glucose-Capillary: 144 mg/dL — ABNORMAL HIGH (ref 70–99)
Glucose-Capillary: 110 mg/dL — ABNORMAL HIGH (ref 70–99)
Glucose-Capillary: 180 mg/dL — ABNORMAL HIGH (ref 70–99)
Glucose-Capillary: 159 mg/dL — ABNORMAL HIGH (ref 70–99)

## 2023-10-07 LAB — SEDIMENTATION RATE: Sed Rate: 61 mm/h — ABNORMAL HIGH (ref 0–20)

## 2023-10-07 LAB — C-REACTIVE PROTEIN: CRP: 1.8 mg/dL — ABNORMAL HIGH (ref <1.0)

## 2023-10-07 MED ORDER — INFLUENZA VAC A&B SURF ANT ADJ 0.5 ML IM SUSY
0.5000 mL | PREFILLED_SYRINGE | INTRAMUSCULAR | Status: AC
  Administered 2023-10-09: 0.5 mL via INTRAMUSCULAR
  Filled 2023-10-07: qty 0.5

## 2023-10-07 NOTE — Consult Note (Signed)
Consultation Note Date: 10/07/2023   Patient Name: Christian Fields  DOB: 08-May-1956  MRN: [id number]  Age / Sex: 67 y.o., male  PCP: Girtha Rm Eli Phillips, MD Referring Physician: Delfino Lovett, MD  Reason for Consultation: Establishing goals of care  HPI/Patient Profile: 67 y.o. male with medical history significant for COPD Gold stage IV, chronic hypoxic respiratory failure on 4 L continuously, HTN, HLD, IIDM, OSA not on CPAP, PVD on aspirin Plavix chronic pain syndrome on Suboxone non-small cell lung carcinoma status post treatment, COPD stage II, chronic HFpEF, diabetic neuropathy admitted for worsening shortness of breath   Clinical Assessment and Goals of Care: Notes and labs reviewed.  Patient is known to me from a previous admission.  Patient is widowed and has 3 children.  Patient discusses that overall, he was hospitalized, discharged home for a total of 6 days before being taken to [facility].  He states he was at [facility] for 3 weeks and then was sent to skilled nursing, and then was brought here.    We discussed his diagnoses, prognosis, GOC, EOL wishes disposition and options.  Created space and opportunity for patient  to explore thoughts and feelings regarding current medical information.   A detailed discussion was had today regarding advanced directives.  Concepts specific to code status, artifical feeding and hydration, IV antibiotics and rehospitalization were discussed.  The difference between an aggressive medical intervention path and a comfort care path was discussed.  Values and goals of care important to patient and family were attempted to be elicited.  Discussed limitations of medical interventions to prolong quality of life in some situations and discussed the concept of human mortality.  He states he does not want a dysphagia diet and would like to continue to eat and drink as he wishes.  He  understands the risk of aspiration pneumonia.  He states he would never want a feeding tube.  He states he would want temporary ventilator support for up to 2 weeks, but would not want tracheostomy.  He states he would not want CPR.  Patient advises that his daughter will be coming and he would like to speak with her prior to making any formal changes.  He states he is considering doing H POA and living will.  I offered for him to complete that while here, and he states he will think about it.  SUMMARY OF RECOMMENDATIONS    PMT will follow  Prognosis:  Poor long-term       Primary Diagnoses: Present on Admission:  COPD with acute exacerbation (HCC)  NSCLC of left lung (HCC)  Chronic heart failure with preserved ejection fraction (HFpEF) (HCC)  SOB (shortness of breath)  Tobacco use disorder  Respiratory distress  Acute on chronic respiratory failure with hypoxia (HCC)  Anemia   I have reviewed the medical record, interviewed the patient and family, and examined the patient. The following aspects are pertinent.  Past Medical History:  Diagnosis Date   Anemia    Anxiety    Arthritis  Asthma    Cancer (HCC)    Basal Cell Skin Cancer   Chronic back pain    COPD (chronic obstructive pulmonary disease) (HCC)    Depression    Diabetes mellitus (HCC)    Dyspnea    GERD (gastroesophageal reflux disease)    Gout    Gout    Headache    History of blood clots    Left Leg--July 2018   History of kidney stones    Hyperlipidemia    Hyperlipidemia    Hypertension    Kidney stones    Neuropathy    On home oxygen therapy    2 L / M   Pneumonia 06/2017   Sleep apnea    Ulcer of foot (HCC)    Right   Social History   Socioeconomic History   Marital status: Widowed    Spouse name: Not on file   Number of children: Not on file   Years of education: Not on file   Highest education level: Not on file  Occupational History   Not on file  Tobacco Use   Smoking status:  Former    Current packs/day: 0.50    Average packs/day: 0.5 packs/day for 6.8 years (3.4 ttl pk-yrs)    Types: Cigarettes    Start date: 2018   Smokeless tobacco: Never  Vaping Use   Vaping status: Never Used  Substance and Sexual Activity   Alcohol use: Yes    Alcohol/week: 1.0 standard drink of alcohol    Types: 1 Cans of beer per week    Comment: occ   Drug use: No   Sexual activity: Not Currently  Other Topics Concern   Not on file  Social History Narrative   Not on file   Social Determinants of Health   Financial Resource Strain: Low Risk  (04/19/2023)   Received from [facility], [facility]   Overall Financial Resource Strain (CARDIA)    Difficulty of Paying Living Expenses: Not hard at all  Food Insecurity: No Food Insecurity (10/05/2023)   Hunger Vital Sign    Worried About Running Out of Food in the Last Year: Never true    Ran Out of Food in the Last Year: Never true  Transportation Needs: No Transportation Needs (10/05/2023)   PRAPARE - Administrator, Civil Service (Medical): No    Lack of Transportation (Non-Medical): No  Physical Activity: Not on file  Stress: Not on file  Social Connections: Not on file   Family History  Problem Relation Age of Onset   Hypertension Mother    Scheduled Meds:  aspirin EC  81 mg Oral Daily   buprenorphine-naloxone  1 tablet Sublingual TID   clonazepam  1 mg Oral BID   clopidogrel  75 mg Oral Daily   DULoxetine  30 mg Oral Daily   gabapentin  600 mg Oral TID   heparin  5,000 Units Subcutaneous Q8H   [START ON 10/08/2023] influenza vaccine adjuvanted  0.5 mL Intramuscular Tomorrow-1000   insulin aspart  0-5 Units Subcutaneous QHS   insulin aspart  0-9 Units Subcutaneous TID WC   ipratropium  0.5 mg Nebulization BID   levalbuterol  1.25 mg Nebulization BID   metoprolol succinate  50 mg Oral Daily   multivitamin with minerals  1 tablet Oral Daily   predniSONE  40 mg Oral Q breakfast   Ensure Max  Protein  11 oz Oral BID   roflumilast  500 mcg Oral  Daily   sodium chloride flush  3 mL Intravenous Q12H   thiamine  100 mg Oral Daily   Continuous Infusions: PRN Meds:.acetaminophen **OR** acetaminophen, albuterol, guaiFENesin, hydrALAZINE Medications Prior to Admission:  Prior to Admission medications   Medication Sig Start Date End Date Taking? Authorizing Provider  acetaminophen (TYLENOL) 325 MG tablet Take 650 mg by mouth every 6 (six) hours as needed.   Yes [provider]  albuterol (VENTOLIN HFA) 108 (90 Base) MCG/ACT inhaler Inhale 2 puffs into the lungs every 4 (four) hours as needed for wheezing or shortness of breath.   Yes [provider]  allopurinol (ZYLOPRIM) 300 MG tablet Take 600 mg by mouth daily. 02/24/20  Yes [provider]  aspirin EC 81 MG tablet Take 1 tablet by mouth daily.   Yes [provider]  azelastine (ASTELIN) 0.1 % nasal spray Place 2 sprays into both nostrils 2 (two) times daily. 05/27/23  Yes [provider]  Buprenorphine HCl-Naloxone HCl 8-2 MG FILM Place 1 Film under the tongue every 8 (eight) hours.   Yes [provider]  clopidogrel (PLAVIX) 75 MG tablet Take 1 tablet (75 mg total) by mouth daily. 02/02/21  Yes Willeen Niece, MD  Dextromethorphan-guaiFENesin 10-100 MG/5ML liquid Take 5 mLs by mouth every 4 (four) hours as needed (cough). 10/01/23  Yes [provider]  DULoxetine (CYMBALTA) 60 MG capsule Take 60 mg by mouth daily. 10/02/23 11/01/23 Yes [provider]  escitalopram (LEXAPRO) 5 MG tablet Take 5 mg by mouth daily. 10/01/23 10/31/23 Yes [provider]  ezetimibe (ZETIA) 10 MG tablet Take 10 mg by mouth daily. 02/24/20  Yes [provider]  Fluticasone-Umeclidin-Vilant (TRELEGY ELLIPTA) 100-62.5-25 MCG/ACT AEPB Inhale 1 puff into the lungs in the morning. 09/12/21  Yes [provider]  gabapentin (NEURONTIN) 100 MG capsule Take 200 mg by mouth 3  (three) times daily. 10/01/23 10/31/23 Yes [provider]  ipratropium-albuterol (DUONEB) 0.5-2.5 (3) MG/3ML SOLN Take 3 mLs by nebulization every 6 (six) hours as needed (wheezing).   Yes [provider]  metFORMIN (GLUCOPHAGE) 1000 MG tablet Take 1,000 mg by mouth daily.   Yes [provider]  metoprolol succinate (TOPROL-XL) 50 MG 24 hr tablet Take 50 mg by mouth daily. 02/24/20  Yes [provider]  Multiple Vitamins-Minerals (MULTIVITAMIN ADULT, MINERALS, PO) Take 1 tablet by mouth every morning.   Yes [provider]  ondansetron (ZOFRAN-ODT) 4 MG disintegrating tablet Take 4 mg by mouth every 8 (eight) hours as needed for nausea.   Yes [provider]  roflumilast (DALIRESP) 500 MCG TABS tablet Take 500 mcg by mouth daily. 08/15/21  Yes [provider]  simvastatin (ZOCOR) 40 MG tablet Take 40 mg by mouth at bedtime. 01/17/16  Yes [provider]  traZODone (DESYREL) 50 MG tablet Take 50 mg by mouth at bedtime. 10/01/23 10/31/23 Yes [provider]  amoxicillin-clavulanate (AUGMENTIN) 875-125 MG tablet Take 1 tablet by mouth every 12 (twelve) hours. Patient not taking: Reported on 10/04/2023 09/04/23   Loyce Dys, MD  bisacodyl (DULCOLAX) 5 MG EC tablet Take 1 tablet (5 mg total) by mouth daily as needed for moderate constipation. Patient not taking: Reported on 10/04/2023 09/04/23   Loyce Dys, MD  cyanocobalamin (VITAMIN B12) 1000 MCG/ML injection Inject 1 mL (1,000 mcg total) into the muscle every 30 (thirty) days. 07/12/22   Earna Coder, MD  dapagliflozin propanediol (FARXIGA) 10 MG TABS tablet Take 10 mg by mouth daily.  Patient not taking: Reported on 10/04/2023    [provider]  DULoxetine (CYMBALTA) 30 MG capsule Take 1 capsule by mouth daily. Patient not taking: Reported on 10/04/2023 01/17/16   [provider]  fluticasone (FLONASE) 50 MCG/ACT nasal spray Place 1 spray into  both nostrils daily as needed for allergies. Patient not taking: Reported on 10/04/2023 10/16/18   [provider]  levalbuterol Pauline Aus) 0.63 MG/3ML nebulizer solution Take 3 mLs (0.63 mg total) by nebulization every 6 (six) hours as needed for wheezing or shortness of breath. Patient not taking: Reported on 10/04/2023 09/04/23   Loyce Dys, MD  OXYGEN Inhale 2 L into the lungs.    [provider]  pantoprazole (PROTONIX) 40 MG tablet Take 40 mg by mouth daily. Patient not taking: Reported on 10/04/2023 06/20/21   [provider]  predniSONE (DELTASONE) 20 MG tablet Take 1 tablet (20 mg total) by mouth daily with breakfast. Patient not taking: Reported on 10/04/2023 09/05/23   Loyce Dys, MD  Syringe/Needle, Disp, (SYRINGE 3CC/25GX1") 25G X 1" 3 ML MISC 1 Syringe by Does not apply route every 30 (thirty) days. 07/12/22   Earna Coder, MD   Allergies  Allergen Reactions   Bee Venom Anaphylaxis   Linezolid Other (See Comments)    Thrombocytopenia, hyperlactatemia   Azithromycin Itching and Rash   Review of Systems  All other systems reviewed and are negative.   Physical Exam Pulmonary:     Effort: Pulmonary effort is normal.  Neurological:     Mental Status: He is alert.     Vital Signs: BP 119/77 (BP Location: Right Arm)   Pulse 84   Temp 98 F (36.7 C) (Oral)   Resp 16   Ht 5\' 7"  (1.702 m)   Wt 75.4 kg   SpO2 93%   BMI 26.03 kg/m  Pain Scale: 0-10   Pain Score: 0-No pain   SpO2: SpO2: 93 % O2 Device:SpO2: 93 % O2 Flow Rate: .O2 Flow Rate (L/min): 4 L/min  IO: Intake/output summary:  Intake/Output Summary (Last 24 hours) at 10/07/2023 1609 Last data filed at 10/07/2023 1033 Gross per 24 hour  Intake 0.4 ml  Output --  Net 0.4 ml    LBM: Last BM Date : 10/06/23 Baseline Weight: Weight: 75.7 kg Most recent weight: Weight: 75.4 kg         Signed by: Morton Stall, NP   Please contact Palliative Medicine Team  phone at [phone] for questions and concerns.  For individual provider: See Loretha Stapler

## 2023-10-07 NOTE — Progress Notes (Signed)
Pulmonary Medicine          Date: 10/07/2023,   MRN# [id number] DREVION PINALES May 16, 1956     AdmissionWeight: 75.7 kg                 CurrentWeight: 75.4 kg         HISTORY OF PRESENT ILLNESS   Laying in bed flat, no new complaints. Quite dyspnea to transfer to commode. At rest no diff breathing. No cardiac sxs or leg pain, or edema. See last note   PAST MEDICAL HISTORY   Past Medical History:  Diagnosis Date   Anemia    Anxiety    Arthritis    Asthma    Cancer (HCC)    Basal Cell Skin Cancer   Chronic back pain    COPD (chronic obstructive pulmonary disease) (HCC)    Depression    Diabetes mellitus (HCC)    Dyspnea    GERD (gastroesophageal reflux disease)    Gout    Gout    Headache    History of blood clots    Left Leg--July 2018   History of kidney stones    Hyperlipidemia    Hyperlipidemia    Hypertension    Kidney stones    Neuropathy    On home oxygen therapy    2 L / M   Pneumonia 06/2017   Sleep apnea    Ulcer of foot (HCC)    Right     SURGICAL HISTORY   Past Surgical History:  Procedure Laterality Date   APPENDECTOMY     DG FEET 2 VIEWS BILAT     IRRIGATION AND DEBRIDEMENT FOOT Left 07/11/2023   Procedure: IRRIGATION AND DEBRIDEMENT FOOT;  Surgeon: Gwyneth Revels, DPM;  Location: [facility];  Service: Orthopedics/Podiatry;  Laterality: Left;   LIPOMA EXCISION Right 08/15/2017   Procedure: EXCISION TUMOR(CYST) FOOT;  Surgeon: Recardo Evangelist, DPM;  Location: [facility];  Service: Podiatry;  Laterality: Right;   LOWER EXTREMITY ANGIOGRAPHY Left 07/03/2023   Procedure: Lower Extremity Angiography;  Surgeon: Annice Needy, MD;  Location: [facility];  Service: Cardiovascular;  Laterality: Left;   OTHER SURGICAL HISTORY Bilateral Foot surgery     FAMILY HISTORY   Family History  Problem Relation Age of Onset   Hypertension Mother      SOCIAL HISTORY   Social History   Tobacco Use   Smoking status: Former     Current packs/day: 0.50    Average packs/day: 0.5 packs/day for 6.8 years (3.4 ttl pk-yrs)    Types: Cigarettes    Start date: 2018   Smokeless tobacco: Never  Vaping Use   Vaping status: Never Used  Substance Use Topics   Alcohol use: Yes    Alcohol/week: 1.0 standard drink of alcohol    Types: 1 Cans of beer per week    Comment: occ   Drug use: No     MEDICATIONS    Home Medication:    Current Medication:  Current Facility-Administered Medications:    acetaminophen (TYLENOL) tablet 650 mg, 650 mg, Oral, Q6H PRN **OR** acetaminophen (TYLENOL) suppository 650 mg, 650 mg, Rectal, Q6H PRN, Allena Katz, Ekta V, MD   albuterol (PROVENTIL) (2.5 MG/3ML) 0.083% nebulizer solution 2.5 mg, 2.5 mg, Inhalation, Q4H PRN, Gertha Calkin, MD   aspirin EC tablet 81 mg, 81 mg, Oral, Daily, Irena Cords V, MD, 81 mg at 10/07/23 0900   buprenorphine-naloxone (SUBOXONE) 8-2 mg per SL tablet 1 tablet, 1  tablet, Sublingual, TID, Delfino Lovett, MD, 1 tablet at 10/07/23 0900   clonazePAM (KLONOPIN) disintegrating tablet 1 mg, 1 mg, Oral, BID, Delfino Lovett, MD, 1 mg at 10/07/23 0859   clopidogrel (PLAVIX) tablet 75 mg, 75 mg, Oral, Daily, Gertha Calkin, MD, 75 mg at 10/07/23 0859   DULoxetine (CYMBALTA) DR capsule 30 mg, 30 mg, Oral, Daily, Irena Cords V, MD, 30 mg at 10/07/23 0900   gabapentin (NEURONTIN) capsule 600 mg, 600 mg, Oral, TID, Delfino Lovett, MD, 600 mg at 10/07/23 1618   guaiFENesin (MUCINEX) 12 hr tablet 600 mg, 600 mg, Oral, BID PRN, Gertha Calkin, MD   heparin injection 5,000 Units, 5,000 Units, Subcutaneous, Q8H, Delfino Lovett, MD, 5,000 Units at 10/07/23 1209   hydrALAZINE (APRESOLINE) injection 5 mg, 5 mg, Intravenous, Q4H PRN, Gertha Calkin, MD   [START ON 10/08/2023] influenza vaccine adjuvanted (FLUAD) injection 0.5 mL, 0.5 mL, Intramuscular, Tomorrow-1000, Sherryll Burger, Vipul, MD   insulin aspart (novoLOG) injection 0-5 Units, 0-5 Units, Subcutaneous, QHS, Delfino Lovett, MD, 2 Units at 10/05/23 2200    insulin aspart (novoLOG) injection 0-9 Units, 0-9 Units, Subcutaneous, TID WC, Delfino Lovett, MD, 1 Units at 10/07/23 0900   ipratropium (ATROVENT) nebulizer solution 0.5 mg, 0.5 mg, Nebulization, BID, Delfino Lovett, MD, 0.5 mg at 10/07/23 5409   levalbuterol (XOPENEX) nebulizer solution 1.25 mg, 1.25 mg, Nebulization, BID, Delfino Lovett, MD, 1.25 mg at 10/07/23 8119   metoprolol succinate (TOPROL-XL) 24 hr tablet 50 mg, 50 mg, Oral, Daily, Irena Cords V, MD, 50 mg at 10/07/23 0900   multivitamin with minerals tablet 1 tablet, 1 tablet, Oral, Daily, Delfino Lovett, MD, 1 tablet at 10/07/23 0900   predniSONE (DELTASONE) tablet 40 mg, 40 mg, Oral, Q breakfast, Ronnald Ramp, RPH, 40 mg at 10/07/23 0900   protein supplement (ENSURE MAX) liquid, 11 oz, Oral, BID, Delfino Lovett, MD, 11 oz at 10/07/23 0910   roflumilast (DALIRESP) tablet 500 mcg, 500 mcg, Oral, Daily, Irena Cords V, MD, 500 mcg at 10/07/23 0900   sodium chloride flush (NS) 0.9 % injection 3 mL, 3 mL, Intravenous, Q12H, Gertha Calkin, MD, 3 mL at 10/07/23 0910   thiamine (VITAMIN B1) tablet 100 mg, 100 mg, Oral, Daily, Lowella Bandy, RPH, 100 mg at 10/07/23 0900    ALLERGIES   Bee venom, Linezolid, and Azithromycin     REVIEW OF SYSTEMS    Review of Systems:  Gen:  Denies  fever, sweats, chills weigh loss  HEENT: Denies blurred vision, double vision, ear pain, eye pain, hearing loss, nose bleeds, sore throat Cardiac:  No dizziness, chest pain or heaviness, chest tightness,edema Resp:   +occasional  cough or sputum porduction, ++ shortness of breath +,wheezing, -hemoptysis,  Gi: Denies swallowing difficulty, stomach pain, nausea or vomiting, diarrhea, constipation, bowel incontinence Gu:  Denies bladder incontinence, burning urine Ext:   Denies Joint pain, stiffness or swelling Skin: Denies  skin rash, easy bruising or bleeding or hives Endoc:  Denies polyuria, polydipsia , polyphagia or weight change Psych:   Denies depression,  insomnia or hallucinations   Other:  All other systems negative   VS: BP (!) 153/87 (BP Location: Right Arm)   Pulse 90   Temp 98 F (36.7 C) (Oral)   Resp 20   Ht 5\' 7"  (1.702 m)   Wt 75.4 kg   SpO2 96%   BMI 26.03 kg/m      PHYSICAL EXAM    GENERAL:NAD, no fevers, chills, no weakness no  fatigue, laying flat in bed, nasl cannula on HEAD: Normocephalic, atraumatic.  EYES: Pupils equal, round, reactive to light. Extraocular muscles intact. No scleral icterus.  MOUTH: Moist mucosal membrane. Dentition intact. No abscess noted.  EAR, NOSE, THROAT: Clear without exudates. No external lesions.  NECK: Supple. No thyromegaly. No nodules. No JVD.  PULMONARY: basal scatterd whezes CARDIOVASCULAR: S1 and S2. Regular rate and rhythm. No murmurs, rubs, or gallops. No edema. Pedal pulses 2+ bilaterally.  GASTROINTESTINAL: Soft, nontender, nondistended. No masses. Positive bowel sounds. No hepatosplenomegaly.  MUSCULOSKELETAL: No swelling, clubbing, or edema. Range of motion full in all extremities.  NEUROLOGIC: Cranial nerves II through XII are intact. No gross focal neurological deficits. Sensation intact. Reflexes intact.  SKIN: No ulceration, lesions, rashes, or cyanosis. Skin warm and dry. Turgor intact.  PSYCHIATRIC: Mood, affect within normal limits. The patient is awake, alert and oriented x 3. Insight, judgment intact.       IMAGING    MR HEEL LEFT W WO CONTRAST  Result Date: 10/06/2023 CLINICAL DATA:  Left hindfoot osteomyelitis status post debridement on 07/11/2023 EXAM: MRI OF LOWER LEFT EXTREMITY WITHOUT AND WITH CONTRAST TECHNIQUE: Multiplanar, multisequence MR imaging of the left hindfoot was performed both before and after administration of intravenous contrast. CONTRAST:  7mL GADAVIST GADOBUTROL 1 MMOL/ML IV SOLN COMPARISON:  X-ray 07/02/2023, MRI 06/23/2023 FINDINGS: Technical Note: Despite efforts by the technologist and patient, motion artifact is present on today's  exam and could not be eliminated. This reduces exam sensitivity and specificity. Bones/Joint/Cartilage Postsurgical changes from prior partial calcaneal resection with excision of the plantar calcaneal spur. Along the plantar surface of the posterior calcaneus is a area of focal bone marrow edema and enhancement with confluent low T1 marrow signal compatible with acute osteomyelitis (series 5, image 12). No additional sites of osteomyelitis are identified. No tibiotalar or or subtalar joint effusion to suggest septic arthritis. No acute fracture. 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 Ligaments Lateral ankle ligaments are chronically torn with thickened joint capsule. No evidence to suggest an acute ligamentous injury. Muscles and Tendons Denervation changes of the lower leg and foot musculature. No tenosynovitis. Soft tissues Plantar soft tissue ulceration. Edematous tissue extends to the underlying cortex of the posterior calcaneus. No organized fluid collections. IMPRESSION: 1. Acute osteomyelitis of the posterior calcaneus. Underlying plantar soft tissue ulceration. 2. 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 Electronically Signed   By: Duanne Guess D.O.   On: 10/06/2023 13:33   CT CHEST W CONTRAST  Result Date: 10/04/2023 CLINICAL DATA:  Short of breath, COPD exacerbation, respiratory distress EXAM: CT CHEST WITH CONTRAST TECHNIQUE: Multidetector CT imaging of the chest was performed during intravenous contrast administration. RADIATION DOSE REDUCTION: This exam was performed according to the departmental dose-optimization program which includes automated exposure control, adjustment of the mA and/or kV according to patient size and/or use of iterative reconstruction technique. CONTRAST:  75mL OMNIPAQUE IOHEXOL 300 MG/ML  SOLN COMPARISON:  08/26/2023,  08/30/2023, 10/04/2023 FINDINGS: Cardiovascular: The heart is unremarkable without pericardial effusion. Dilated main pulmonary arteries consistent with pulmonary arterial hypertension, stable. No evidence of thoracic aortic aneurysm or dissection. Atherosclerosis of the aorta and coronary vasculature again noted. Mediastinum/Nodes: Thyroid, trachea, and esophagus are unremarkable. Stable mediastinal and hilar adenopathy. Index precarinal lymph node measures up to 15 mm reference image 70/2, unchanged since prior exam. Lungs/Pleura: Diffuse emphysema again noted. Stable subpleural masslike  density within the left upper lobe measuring 1.6 x 2.0 cm reference image 50/2, likely site of prior treated lung cancer. Interval enlargement of the left upper lobe nodularity seen on prior exam, now measuring 17 x 11 mm reference image 51/3, previously measuring 7 x 6 mm on the 08/26/2023 exam. The punctate subpleural nodules within the right middle lobe on prior study are unchanged. No new areas of airspace disease, effusion, or pneumothorax. Chronic areas of scarring at the lung bases, most pronounced within the lingula. Central airways are patent. Upper Abdomen: No acute abnormality. Musculoskeletal: No acute or destructive bony abnormalities. Stable mild wedge compression deformity of the T5 vertebral body. Stable spondylosis at the thoracolumbar junction. Reconstructed images demonstrate no additional findings. IMPRESSION: 1. Progressive enlargement of the right upper lobe nodule seen on prior exam, now measuring 17 x 11 mm. While the patient has a history of waxing and waning pulmonary nodularity presumed to be inflammatory or infectious, the persistence since the 08/30/2023 exam is somewhat concerning. PET-CT or interval 3 month follow-up chest CT are recommended for further evaluation. 2. Stable mediastinal and hilar adenopathy, nonspecific. This could also be re-evaluated at the time of follow-up imaging. 3. Stable post  therapeutic changes within the left upper lobe, consistent with history of treated lung cancer. 4. Aortic Atherosclerosis (ICD10-I70.0) and Emphysema (ICD10-J43.9). 5. Stable enlarged pulmonary arteries consistent with pulmonary arterial hypertension. Electronically Signed   By: Sharlet Salina M.D.   On: 10/04/2023 21:11   DG Chest Portable 1 View  Result Date: 10/04/2023 CLINICAL DATA:  SOB EXAM: PORTABLE CHEST 1 VIEW COMPARISON:  August 30, 2023 FINDINGS: The cardiomediastinal silhouette is unchanged in contour.Atherosclerotic calcifications. No pleural effusion. No pneumothorax. Similar appearance of bilateral nodular opacities, better evaluated on recent chest CT. RIGHT-sided predominant bronchitic markings and mild bronchial wall cuffing. Emphysematous changes. IMPRESSION: 1. RIGHT-sided predominant bronchitic markings and mild bronchial wall cuffing. This could reflect bronchitis. 2. Similar appearance of bilateral nodular opacities, better evaluated on recent chest CT. Electronically Signed   By: Meda Klinefelter M.D.   On: 10/04/2023 17:31      ASSESSMENT/PLAN   This is an ex smoker who hase severe copd. Her with copd exacerbation, complicated by anxiety, at risk for co2 retention. Presently seem to be stable on present regimen at rest. But desat precipitantly with activity. Today he has not gone backward will stay the course He has abnormal changes on chest ct. Prior hx of lung cancer s/p radiatioin. RUL enlarging lesion is concerning.  Will follow on an out patient basis. Oncology also following. PLAN -4liter at rest, increase to 6 liters with activity -physical therapy -on d/c will ad to his regimen ohtuvayre -switch to po steroids and wean -eguivalent to trelegy/albuterol as you doing -continue  dalersp -incentive spiro/ flutter valve -out patient pet scan planned regarding the chest ct findings -to sick for diagnostic bronch at this time  -further orders per above        Thank you for allowing me to participate in the care of this patient.   Patient/Family are satisfied with care plan and all questions have been answered.  This document was prepared using Dragon voice recognition software and may include unintentional dictation errors.     Ned Clines, M.D.  Division of Pulmonary & Critical Care Medicine  [facility]

## 2023-10-07 NOTE — Care Management Important Message (Signed)
Important Message  Patient Details  Name: JALEEL COBAUGH MRN: [id number] Date of Birth: [birth year]   Important Message Given:  N/A - LOS <3 / Initial given by admissions     Olegario Messier A Elexus Barman 10/07/2023, 9:34 AM

## 2023-10-07 NOTE — TOC Initial Note (Signed)
Transition of Care [facility]) - Initial/Assessment Note    Patient Details  Name: Christian Fields MRN: [id number] Date of Birth: July 11, 1956  [facility] CM/SW Contact:    Chapman Fitch, RN Phone Number: 10/07/2023, 1:51 PM  Clinical Narrative:                  Met with patient at bedside  Patient admitted from [facility] for rehab Therapy recommending Rehab Patient agreeable to rehab, but does not want to return to [facility]  Existing PASRR Fl2 sent for signature.  Bed search initiated   Podiatry consult pending  Expected Discharge Plan: Skilled Nursing Facility     Patient Goals and CMS Choice            Expected Discharge Plan and Services                                              Prior Living Arrangements/Services                       Activities of Daily Living   ADL Screening (condition at time of admission) Independently performs ADLs?: No Does the patient have a NEW difficulty with bathing/dressing/toileting/self-feeding that is expected to last >3 days?: No Does the patient have a NEW difficulty with getting in/out of bed, walking, or climbing stairs that is expected to last >3 days?: No Does the patient have a NEW difficulty with communication that is expected to last >3 days?: No Is the patient deaf or have difficulty hearing?: No Does the patient have difficulty seeing, even when wearing glasses/contacts?: No Does the patient have difficulty concentrating, remembering, or making decisions?: No  Permission Sought/Granted                  Emotional Assessment              Admission diagnosis:  Respiratory distress [R06.03] COPD exacerbation (HCC) [J44.1] Patient Active Problem List   Diagnosis Date Noted   Acute hypoxic respiratory failure (HCC) 10/06/2023   Respiratory distress 10/04/2023   Anemia 10/04/2023   Multifocal pneumonia 08/30/2023   Acute osteomyelitis  of left calcaneus (HCC) 07/11/2023   Diabetic infection of left foot (HCC) 07/11/2023   History of osteomyelitis 07/10/2023   Cellulitis and abscess of toe of left foot 07/03/2023   PVD (peripheral vascular disease) (HCC) 07/03/2023   Cellulitis of left lower extremity 07/02/2023   Diabetic foot ulcer (HCC) 07/02/2023   HLD (hyperlipidemia) 07/02/2023   Depression with anxiety 07/02/2023   Elevated lactic acid level 07/02/2023   Abnormal LFTs 07/02/2023   COPD (chronic obstructive pulmonary disease) (HCC) 07/02/2023   Diarrhea 07/02/2023   History of ankle fusion (Right) 12/31/2021   Chronic anticoagulation (Plavix) 12/31/2021   Long term prescription benzodiazepine use (Klonopin) 12/31/2021   DDD (degenerative disc disease), cervical 12/31/2021   Pharmacologic therapy 10/31/2021   Disorder of skeletal system 10/31/2021   Problems influencing health status 10/31/2021   Aspiration pneumonia of right lower lobe due to gastric secretions (HCC)    Sepsis (HCC) 08/05/2021   Gout 08/05/2021   Depression 08/05/2021   Chronic heart failure with preserved ejection fraction (HFpEF) (HCC) 08/05/2021   HCAP (healthcare-associated pneumonia) 08/05/2021   Pain of left lower extremity 03/25/2021   Acute on chronic respiratory failure  with hypoxia (HCC) 03/08/2021   Septic shock (HCC) 02/26/2021   Polysubstance (excluding opioids) dependence (HCC) 01/30/2021   Acute metabolic encephalopathy 01/29/2021   COPD with acute exacerbation (HCC) 01/29/2021   Iron deficiency anemia 11/27/2020   Severe sepsis (HCC)    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   Left leg swelling 04/28/2020   Confusion    Acute respiratory failure with hypoxia (HCC) 03/26/2020   Pneumonia of both lower lobes due to infectious organism 03/25/2020   Acute renal failure superimposed on stage 2 chronic kidney disease (HCC) 01/13/2020   Cellulitis of left leg 01/12/2020   COVID-19 virus detected 01/12/2020   Diabetic ulcer of  left midfoot associated with type 2 diabetes mellitus, with fat layer exposed (HCC) 01/12/2020   Panlobular emphysema (HCC) 01/12/2020   Arthritis 10/29/2019   Hemorrhoids 10/29/2019   Senile nuclear sclerosis, bilateral 02/15/2019   Cellulitis of left upper limb 02/11/2019   AAA (abdominal aortic aneurysm) without rupture 01/15/2019   Localized swelling, mass and lump, upper limb 12/24/2018   Pain in femur 12/24/2018   TIA (transient ischemic attack) 11/18/2018   Cortical age-related cataract of both eyes 11/02/2018   Fracture of multiple ribs 04/20/2018   Overweight (BMI 25.0-29.9) 04/20/2018   Hypertension 07/08/2017   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/08/2017   Hyperlipidemia 07/08/2017   Tobacco use disorder 07/08/2017   Atherosclerosis of native arteries of extremity with intermittent claudication (HCC) 07/08/2017   SOB (shortness of breath) 06/19/2017   Acute on chronic respiratory failure with hypoxia and hypercapnia (HCC) 12/15/2016   CAP (community acquired pneumonia) 12/15/2016   Chronic pain syndrome 12/15/2016   Chronically on opiate therapy 12/15/2016   History of kidney stones 12/15/2016   Polypharmacy 12/15/2016   Somnolence 12/15/2016   Foot ulcer (HCC) 01/19/2016   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) 11/29/2015   Other psychoactive substance use, unspecified with withdrawal, uncomplicated (HCC) 11/29/2015   Type 2 diabetes mellitus with foot ulcer (CODE) (HCC) 11/27/2015   Chronic obstructive pulmonary disease, unspecified (HCC) 01/18/2013   Depressive disorder 01/18/2013   Hereditary and idiopathic peripheral neuropathy 01/18/2013   Sleep apnea 01/18/2013   Low back pain 11/23/2010   Acute gouty arthropathy 07/10/2010   Problem related to lifestyle 10/29/2004   PCP:  Alease Medina, MD Pharmacy:   [facility] [place], [place] - [address] [place] [address] Phone: [phone] Fax:  [phone]  [facility] [address] [place] [address] Phone: [phone] Fax: [phone]     Social Determinants of Health (SDOH) Social History: SDOH Screenings   Food Insecurity: No Food Insecurity (10/05/2023)  Housing: Low Risk  (10/05/2023)  Transportation Needs: No Transportation Needs (10/05/2023)  Utilities: Not At Risk (10/05/2023)  Financial Resource Strain: Low Risk  (04/19/2023)   Received from [facility]  Tobacco Use: Medium Risk (10/05/2023)   SDOH Interventions:     Readmission Risk Interventions    10/07/2023    1:48 PM 09/03/2023    2:13 PM 07/12/2023   10:17 AM  Readmission Risk Prevention Plan  Transportation Screening Complete Complete Complete  PCP or Specialist Appt within 3-5 Days   Complete  HRI or Home Care Consult   Complete  Social Work Consult for Recovery Care Planning/Counseling   Complete  Palliative Care Screening   Not Applicable  Medication Review (RN Care Manager) Complete Complete Complete  PCP or Specialist appointment  within 3-5 days of discharge  Complete   HRI or Home Care Consult Complete Complete   SW Recovery Care/Counseling Consult  Complete   Palliative Care Screening Not Applicable Not Applicable   Skilled Nursing Facility Complete Not Applicable

## 2023-10-07 NOTE — Plan of Care (Signed)
  Problem: Education: Goal: Knowledge of disease or condition will improve Outcome: Progressing Goal: Knowledge of the prescribed therapeutic regimen will improve Outcome: Progressing Goal: Individualized Educational Video(s) Outcome: Progressing   Problem: Activity: Goal: Ability to tolerate increased activity will improve Outcome: Progressing Goal: Will verbalize the importance of balancing activity with adequate rest periods Outcome: Progressing   Problem: Respiratory: Goal: Ability to maintain a clear airway will improve Outcome: Progressing Goal: Levels of oxygenation will improve Outcome: Progressing Goal: Ability to maintain adequate ventilation will improve Outcome: Progressing   Problem: Education: Goal: Knowledge of General Education information will improve Description: Including pain rating scale, medication(s)/side effects and non-pharmacologic comfort measures Outcome: Progressing   Problem: Health Behavior/Discharge Planning: Goal: Ability to manage health-related needs will improve Outcome: Progressing   Problem: Clinical Measurements: Goal: Ability to maintain clinical measurements within normal limits will improve Outcome: Progressing Goal: Will remain free from infection Outcome: Progressing Goal: Diagnostic test results will improve Outcome: Progressing Goal: Respiratory complications will improve Outcome: Progressing Goal: Cardiovascular complication will be avoided Outcome: Progressing   Problem: Activity: Goal: Risk for activity intolerance will decrease Outcome: Progressing   Problem: Nutrition: Goal: Adequate nutrition will be maintained Outcome: Progressing   Problem: Coping: Goal: Level of anxiety will decrease Outcome: Progressing   Problem: Elimination: Goal: Will not experience complications related to bowel motility Outcome: Progressing Goal: Will not experience complications related to urinary retention Outcome: Progressing    Problem: Pain Management: Goal: General experience of comfort will improve Outcome: Progressing   Problem: Safety: Goal: Ability to remain free from injury will improve Outcome: Progressing   Problem: Skin Integrity: Goal: Risk for impaired skin integrity will decrease Outcome: Progressing   Problem: Education: Goal: Ability to describe self-care measures that may prevent or decrease complications (Diabetes Survival Skills Education) will improve Outcome: Progressing Goal: Individualized Educational Video(s) Outcome: Progressing   Problem: Coping: Goal: Ability to adjust to condition or change in health will improve Outcome: Progressing   Problem: Fluid Volume: Goal: Ability to maintain a balanced intake and output will improve Outcome: Progressing   Problem: Health Behavior/Discharge Planning: Goal: Ability to identify and utilize available resources and services will improve Outcome: Progressing Goal: Ability to manage health-related needs will improve Outcome: Progressing   Problem: Metabolic: Goal: Ability to maintain appropriate glucose levels will improve Outcome: Progressing   Problem: Nutritional: Goal: Maintenance of adequate nutrition will improve Outcome: Progressing Goal: Progress toward achieving an optimal weight will improve Outcome: Progressing   Problem: Skin Integrity: Goal: Risk for impaired skin integrity will decrease Outcome: Progressing   Problem: Tissue Perfusion: Goal: Adequacy of tissue perfusion will improve Outcome: Progressing

## 2023-10-07 NOTE — Progress Notes (Signed)
1      PROGRESS NOTE    Christian Fields  MWN:[id number] DOB: October 10, 1956 DOA: 10/04/2023 PCP: Alease Medina, MD    Brief Narrative:   67 y.o. male with medical history significant for COPD Gold stage IV, chronic hypoxic respiratory failure on 4 L continuously, HTN, HLD, IIDM, OSA not on CPAP, PVD on aspirin Plavix chronic pain syndrome on Suboxone non-small cell lung carcinoma status post treatment, COPD stage II, chronic HFpEF, diabetic neuropathy admitted for worsening shortness of breath  10/27: DC telemetry, transfer to any MedSurg, podiatry and palliative care consult 10/28: MRI of the left heel shows Acute osteomyelitis of the posterior calcaneus.  ID and pulmonary consult.  Started IV Rocephin.  Nonweightbearing on left foot 10/29: PT and OT eval recommends SNF.  Podiatry and ID does not recommend any antibiotics or intervention needed for left heel wound   Assessment & Plan:   Principal Problem:   Respiratory distress Active Problems:   SOB (shortness of breath)   Acute on chronic respiratory failure with hypoxia (HCC)   COPD with acute exacerbation (HCC)   Tobacco use disorder   Chronic heart failure with preserved ejection fraction (HFpEF) (HCC)   NSCLC of left lung (HCC)   Insulin dependent type 2 diabetes mellitus (HCC)   Anemia   Acute hypoxic respiratory failure (HCC)  Chronic respiratory failure with hypoxia (HCC) Due to underlying COPD chronically on 4 l/min O2.  Initially required BiPAP while in the ED and now on 4 L oxygen Of note patient was recently at [facility] and discharged after 23 days of hospitalization per patient, he was discharged to [facility] healthcare rehab. --Supplement O2, wean as tolerated --Target spO2 > 88--92% -- Continue steroid therapy --Changed Duonebs & albuterol to Ipratropium and Xopenex on account of tachycardia -- Continue DALIRESP  --Weaned off bipap  --Pulmonary Dr. Meredeth Ide following -- Await blood and sputum  cultures --Mucinex & other supportive care per orders   Hypermagnesemia Resolved   Chronic heart failure with preserved ejection fraction (HFpEF) (HCC) Well compensated for now.  LVEF of 55 to 60%   PVD (peripheral vascular disease) (HCC) On ASA, Plavix   Chronic pain syndrome Home regimen - Suboxone TID, gabapentin 800 mg PO QID Had to cut back on gabapentin dose to 600 mg p.o. 3 times daily due to renal function.  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 Follows with Dr. Ether Griffins - likely missed outpatient appointment as he was admitted at [facility].   --Non-weight-bearing on LLE --WOC consulted for wound care  -MRI of the left heel/ankle shows acute osteomyelitis of the left calcaneus -Appreciate ID and podiatry input.  The wound has improved from before.  No need for antibiotic or any wound debridement from podiatry standpoint   Insulin dependent type 2 diabetes mellitus (HCC) Sliding scale Novolog for now   Anemia of chronic disease Stable H&H  Anxiety and depression Continue Cymbalta Continue Klonopin for now    DVT prophylaxis:  heparin injection 5,000 Units Start: 10/05/23 1400     Code Status: Full code Family Communication: None at bedside Disposition Plan: Possible discharge to SNF once bed available.  He does not want to go back to [facility]   Consultants:  Podiatry Pulmonary ID Palliative care  Subjective:  Denies any new symptoms.  Appreciative of his care here.  Hoping to  find SNF  Objective: Vitals:   10/07/23 0456 10/07/23 0500 10/07/23 0815 10/07/23 1621  BP: (!) 96/58  119/77 (!) 153/87  Pulse: 85  84 90  Resp: 18  16 20   Temp: 98.7 F (37.1 C)  98 F (36.7 C) 98 F (36.7 C)  TempSrc: Oral  Oral Oral  SpO2: 98%  93% 96%  Weight:  75.4 kg    Height:         Intake/Output Summary (Last 24 hours) at 10/07/2023 1622 Last data filed at 10/07/2023 1033 Gross per 24 hour  Intake 0.4 ml  Output --  Net 0.4 ml   Filed Weights   10/04/23 1646 10/06/23 0434 10/07/23 0500  Weight: 75.7 kg 75 kg 75.4 kg    Examination:  General exam: Appears calm and comfortable  Respiratory system: Clear to auscultation. Respiratory effort normal.  Using 4 L oxygen via nasal cannula Cardiovascular system: S1 & S2 heard, RRR. No JVD, murmurs, rubs, gallops or clicks. No pedal edema. Gastrointestinal system: Abdomen is soft, benign Central nervous system: Alert and oriented. No focal neurological deficits.  Both hands has mild tremor at rest Extremities: Symmetric 5 x 5 power. Skin: Left heel full-thickness ulcer with callus and healing phase Psychiatry: Judgement and insight appear normal. Mood & affect appropriate.     Data Reviewed: I have personally reviewed following labs and imaging studies  CBC: Recent Labs  Lab 10/04/23 1649 10/06/23 0553 10/07/23 0458  WBC 11.1* 21.1* 16.1*  NEUTROABS 6.5  --   --   HGB 10.1* 8.8* 9.1*  HCT 33.2* 29.1* 30.4*  MCV 92.2 91.5 93.8  PLT 293 294 309   Basic Metabolic Panel: Recent Labs  Lab 10/04/23 1649 10/06/23 0553 10/07/23 0458  NA 134* 138 139  K 3.5 4.1 3.7  CL 97* 101 100  CO2 30 30 32  GLUCOSE 165* 151* 128*  BUN 21 33* 34*  CREATININE 1.29* 1.31* 1.25*  CALCIUM 9.0 8.3* 8.2*  MG 3.1*  --   --    GFR: Estimated Creatinine Clearance: 52.9 mL/min (A) (by C-G formula based on SCr of 1.25 mg/dL (H)). Liver Function Tests: Recent Labs  Lab 10/04/23 1649  AST 28  ALT 20  ALKPHOS 95  BILITOT 0.5  PROT 7.7  ALBUMIN 3.2*    Sepsis Labs: Recent Labs  Lab 10/04/23 1649  PROCALCITON <0.10  LATICACIDVEN 1.9    Recent Results (from the past 240 hour(s))  Blood culture (routine x 2)     Status: None (Preliminary result)   Collection Time: 10/04/23  4:49 PM   Specimen: BLOOD  Result  Value Ref Range Status   Specimen Description BLOOD RIGHT ANTECUBITAL  Final   Special Requests   Final    BOTTLES DRAWN AEROBIC AND ANAEROBIC Blood Culture results may not be optimal due to an excessive volume of blood received in culture bottles   Culture   Final    NO GROWTH 3 DAYS Performed at [facility], [address] [place], [place] [address]    Report Status PENDING  Incomplete  Blood culture (routine x 2)     Status: None (Preliminary result)   Collection Time: 10/04/23  4:49 PM   Specimen: BLOOD  Result Value Ref Range Status   Specimen Description BLOOD BLOOD RIGHT HAND  Final   Special Requests   Final    BOTTLES DRAWN AEROBIC AND ANAEROBIC Blood Culture adequate volume   Culture   Final    NO  GROWTH 3 DAYS Performed at [facility], [address] [place], [place] [address]    Report Status PENDING  Incomplete  SARS Coronavirus 2 by RT PCR (hospital order, performed in [facility] hospital lab) *cepheid single result test* Anterior Nasal Swab     Status: None   Collection Time: 10/04/23  4:49 PM   Specimen: Anterior Nasal Swab  Result Value Ref Range Status   SARS Coronavirus 2 by RT PCR NEGATIVE NEGATIVE Final    Comment: (NOTE) SARS-CoV-2 target nucleic acids are NOT DETECTED.  The SARS-CoV-2 RNA is generally detectable in upper and lower respiratory specimens during the acute phase of infection. The lowest concentration of SARS-CoV-2 viral copies this assay can detect is 250 copies / mL. A negative result does not preclude SARS-CoV-2 infection and should not be used as the sole basis for treatment or other patient management decisions.  A negative result may occur with improper specimen collection / handling, submission of specimen other than nasopharyngeal swab, presence of viral mutation(s) within the areas targeted by this assay, and inadequate number of viral copies (<250 copies / mL). A negative result must be combined with  clinical observations, patient history, and epidemiological information.  Fact Sheet for Patients:   RoadLapTop.co.za  Fact Sheet for Healthcare Providers: http://kim-miller.com/  This test is not yet approved or  cleared by the [place] FDA and has been authorized for detection and/or diagnosis of SARS-CoV-2 by FDA under an Emergency Use Authorization (EUA).  This EUA will remain in effect (meaning this test can be used) for the duration of the COVID-19 declaration under Section 564(b)(1) of the Act, 21 U.S.C. section 360bbb-3(b)(1), unless the authorization is terminated or revoked sooner.  Performed at [facility], [address] [place], [place] [address]          Radiology Studies: MR HEEL LEFT W WO CONTRAST  Result Date: 10/06/2023 CLINICAL DATA:  Left hindfoot osteomyelitis status post debridement on 07/11/2023 EXAM: MRI OF LOWER LEFT EXTREMITY WITHOUT AND WITH CONTRAST TECHNIQUE: Multiplanar, multisequence MR imaging of the left hindfoot was performed both before and after administration of intravenous contrast. CONTRAST:  7mL GADAVIST GADOBUTROL 1 MMOL/ML IV SOLN COMPARISON:  X-ray 07/02/2023, MRI 06/23/2023 FINDINGS: Technical Note: Despite efforts by the technologist and patient, motion artifact is present on today's exam and could not be eliminated. This reduces exam sensitivity and specificity. Bones/Joint/Cartilage Postsurgical changes from prior partial calcaneal resection with excision of the plantar calcaneal spur. Along the plantar surface of the posterior calcaneus is a area of focal bone marrow edema and enhancement with confluent low T1 marrow signal compatible with acute osteomyelitis (series 5, image 12). No additional sites of osteomyelitis are identified. No tibiotalar or or subtalar joint effusion to suggest septic arthritis. No acute fracture. 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 Ligaments Lateral ankle ligaments are chronically torn with thickened joint capsule. No evidence to suggest an acute ligamentous injury. Muscles and Tendons Denervation changes of the lower leg and foot musculature. No tenosynovitis. Soft tissues Plantar soft tissue ulceration. Edematous tissue extends to the underlying cortex of the posterior calcaneus. No organized fluid collections. IMPRESSION: 1. Acute osteomyelitis of the posterior calcaneus. Underlying plantar soft tissue ulceration. 2. 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 Electronically Signed   By: Duanne Guess D.O.   On: 10/06/2023 13:33  Scheduled Meds:  aspirin EC  81 mg Oral Daily   buprenorphine-naloxone  1 tablet Sublingual TID   clonazepam  1 mg Oral BID   clopidogrel  75 mg Oral Daily   DULoxetine  30 mg Oral Daily   gabapentin  600 mg Oral TID   heparin  5,000 Units Subcutaneous Q8H   [START ON 10/08/2023] influenza vaccine adjuvanted  0.5 mL Intramuscular Tomorrow-1000   insulin aspart  0-5 Units Subcutaneous QHS   insulin aspart  0-9 Units Subcutaneous TID WC   ipratropium  0.5 mg Nebulization BID   levalbuterol  1.25 mg Nebulization BID   metoprolol succinate  50 mg Oral Daily   multivitamin with minerals  1 tablet Oral Daily   predniSONE  40 mg Oral Q breakfast   Ensure Max Protein  11 oz Oral BID   roflumilast  500 mcg Oral Daily   sodium chloride flush  3 mL Intravenous Q12H   thiamine  100 mg Oral Daily   Continuous Infusions:     LOS: 3 days    Time spent: 35 minutes    Delfino Lovett, MD [facility] Pager [phone]  If 7PM-7AM, please contact night-coverage [email or web address] Password [name] 10/07/2023, 4:22 PM

## 2023-10-07 NOTE — Progress Notes (Signed)
OT Cancellation Note  Patient Details Name: Christian Fields MRN: [id number] DOB: Feb 20, 1956   Cancelled Treatment:    Reason Eval/Treat Not Completed: Patient declined, no reason specified. Chart reviewed, x3 attempts this AM - pt with provider then pt refusing. Will re-attempt as able.   Kathie Dike, M.S. OTR/L  10/07/23, 4:06 PM  ascom [phone]

## 2023-10-07 NOTE — Consult Note (Signed)
ORTHOPAEDIC CONSULTATION  REQUESTING PHYSICIAN: Delfino Lovett, MD  Chief Complaint: Left foot ulceration  HPI: Christian Fields is a 67 y.o. male who complains of ulceration to his left foot.  Well-known to me.  He has undergone I&D with debridement in the past.  He had an area of dehiscence for a period of time and he has been undergoing wound care at home.  Admitted for other issues but ulcerations were noted.  We were requested for evaluation of this.  An MRI has been performed concerning for osteomyelitis.  Past Medical History:  Diagnosis Date   Anemia    Anxiety    Arthritis    Asthma    Cancer (HCC)    Basal Cell Skin Cancer   Chronic back pain    COPD (chronic obstructive pulmonary disease) (HCC)    Depression    Diabetes mellitus (HCC)    Dyspnea    GERD (gastroesophageal reflux disease)    Gout    Gout    Headache    History of blood clots    Left Leg--July 2018   History of kidney stones    Hyperlipidemia    Hyperlipidemia    Hypertension    Kidney stones    Neuropathy    On home oxygen therapy    2 L / M   Pneumonia 06/2017   Sleep apnea    Ulcer of foot (HCC)    Right   Past Surgical History:  Procedure Laterality Date   APPENDECTOMY     DG FEET 2 VIEWS BILAT     IRRIGATION AND DEBRIDEMENT FOOT Left 07/11/2023   Procedure: IRRIGATION AND DEBRIDEMENT FOOT;  Surgeon: Gwyneth Revels, DPM;  Location: [facility] ORS;  Service: Orthopedics/Podiatry;  Laterality: Left;   LIPOMA EXCISION Right 08/15/2017   Procedure: EXCISION TUMOR(CYST) FOOT;  Surgeon: Recardo Evangelist, DPM;  Location: [facility] ORS;  Service: Podiatry;  Laterality: Right;   LOWER EXTREMITY ANGIOGRAPHY Left 07/03/2023   Procedure: Lower Extremity Angiography;  Surgeon: Annice Needy, MD;  Location: [facility] INVASIVE CV LAB;  Service: Cardiovascular;  Laterality: Left;   OTHER SURGICAL HISTORY Bilateral Foot surgery   Social History   Socioeconomic History   Marital status: Widowed    Spouse name: Not on file    Number of children: Not on file   Years of education: Not on file   Highest education level: Not on file  Occupational History   Not on file  Tobacco Use   Smoking status: Former    Current packs/day: 0.50    Average packs/day: 0.5 packs/day for 6.8 years (3.4 ttl pk-yrs)    Types: Cigarettes    Start date: 2018   Smokeless tobacco: Never  Vaping Use   Vaping status: Never Used  Substance and Sexual Activity   Alcohol use: Yes    Alcohol/week: 1.0 standard drink of alcohol    Types: 1 Cans of beer per week    Comment: occ   Drug use: No   Sexual activity: Not Currently  Other Topics Concern   Not on file  Social History Narrative   Not on file   Social Determinants of Health   Financial Resource Strain: Low Risk  (04/19/2023)   Received from [facility], [facility]   Overall Financial Resource Strain (CARDIA)    Difficulty of Paying Living Expenses: Not hard at all  Food Insecurity: No Food Insecurity (10/05/2023)   Hunger Vital Sign    Worried About Running Out of Food  in the Last Year: Never true    Ran Out of Food in the Last Year: Never true  Transportation Needs: No Transportation Needs (10/05/2023)   PRAPARE - Administrator, Civil Service (Medical): No    Lack of Transportation (Non-Medical): No  Physical Activity: Not on file  Stress: Not on file  Social Connections: Not on file   Family History  Problem Relation Age of Onset   Hypertension Mother    Allergies  Allergen Reactions   Bee Venom Anaphylaxis   Linezolid Other (See Comments)    Thrombocytopenia, hyperlactatemia   Azithromycin Itching and Rash   Prior to Admission medications   Medication Sig Start Date End Date Taking? Authorizing Provider  acetaminophen (TYLENOL) 325 MG tablet Take 650 mg by mouth every 6 (six) hours as needed.   Yes [provider]  albuterol (VENTOLIN HFA) 108 (90 Base) MCG/ACT inhaler Inhale 2 puffs into the lungs every 4 (four) hours as  needed for wheezing or shortness of breath.   Yes [provider]  allopurinol (ZYLOPRIM) 300 MG tablet Take 600 mg by mouth daily. 02/24/20  Yes [provider]  aspirin EC 81 MG tablet Take 1 tablet by mouth daily.   Yes [provider]  azelastine (ASTELIN) 0.1 % nasal spray Place 2 sprays into both nostrils 2 (two) times daily. 05/27/23  Yes [provider]  Buprenorphine HCl-Naloxone HCl 8-2 MG FILM Place 1 Film under the tongue every 8 (eight) hours.   Yes [provider]  clopidogrel (PLAVIX) 75 MG tablet Take 1 tablet (75 mg total) by mouth daily. 02/02/21  Yes Willeen Niece, MD  Dextromethorphan-guaiFENesin 10-100 MG/5ML liquid Take 5 mLs by mouth every 4 (four) hours as needed (cough). 10/01/23  Yes [provider]  DULoxetine (CYMBALTA) 60 MG capsule Take 60 mg by mouth daily. 10/02/23 11/01/23 Yes [provider]  escitalopram (LEXAPRO) 5 MG tablet Take 5 mg by mouth daily. 10/01/23 10/31/23 Yes [provider]  ezetimibe (ZETIA) 10 MG tablet Take 10 mg by mouth daily. 02/24/20  Yes [provider]  Fluticasone-Umeclidin-Vilant (TRELEGY ELLIPTA) 100-62.5-25 MCG/ACT AEPB Inhale 1 puff into the lungs in the morning. 09/12/21  Yes [provider]  gabapentin (NEURONTIN) 100 MG capsule Take 200 mg by mouth 3 (three) times daily. 10/01/23 10/31/23 Yes [provider]  ipratropium-albuterol (DUONEB) 0.5-2.5 (3) MG/3ML SOLN Take 3 mLs by nebulization every 6 (six) hours as needed (wheezing).   Yes [provider]  metFORMIN (GLUCOPHAGE) 1000 MG tablet Take 1,000 mg by mouth daily.   Yes [provider]  metoprolol succinate (TOPROL-XL) 50 MG 24 hr tablet Take 50 mg by mouth daily. 02/24/20  Yes [provider]  Multiple Vitamins-Minerals (MULTIVITAMIN ADULT, MINERALS, PO) Take 1 tablet by mouth every morning.   Yes [provider]  ondansetron (ZOFRAN-ODT) 4 MG  disintegrating tablet Take 4 mg by mouth every 8 (eight) hours as needed for nausea.   Yes [provider]  roflumilast (DALIRESP) 500 MCG TABS tablet Take 500 mcg by mouth daily. 08/15/21  Yes [provider]  simvastatin (ZOCOR) 40 MG tablet Take 40 mg by mouth at bedtime. 01/17/16  Yes [provider]  traZODone (DESYREL) 50 MG tablet Take 50 mg by mouth at bedtime. 10/01/23 10/31/23 Yes [provider]  amoxicillin-clavulanate (AUGMENTIN) 875-125 MG tablet Take 1 tablet by mouth every 12 (twelve) hours. Patient not taking: Reported on 10/04/2023 09/04/23   Loyce Dys, MD  bisacodyl (DULCOLAX) 5 MG EC tablet Take 1 tablet (5 mg total) by mouth daily as needed for moderate constipation. Patient not taking: Reported on 10/04/2023 09/04/23   Loyce Dys, MD  cyanocobalamin (VITAMIN B12) 1000 MCG/ML injection Inject 1 mL (1,000 mcg total) into the muscle every 30 (thirty) days. 07/12/22   Earna Coder, MD  dapagliflozin propanediol (FARXIGA) 10 MG TABS tablet Take 10 mg by mouth daily. Patient not taking: Reported on 10/04/2023    [provider]  DULoxetine (CYMBALTA) 30 MG capsule Take 1 capsule by mouth daily. Patient not taking: Reported on 10/04/2023 01/17/16   [provider]  fluticasone (FLONASE) 50 MCG/ACT nasal spray Place 1 spray into both nostrils daily as needed for allergies. Patient not taking: Reported on 10/04/2023 10/16/18   [provider]  levalbuterol Pauline Aus) 0.63 MG/3ML nebulizer solution Take 3 mLs (0.63 mg total) by nebulization every 6 (six) hours as needed for wheezing or shortness of breath. Patient not taking: Reported on 10/04/2023 09/04/23   Loyce Dys, MD  OXYGEN Inhale 2 L into the lungs.    [provider]  pantoprazole (PROTONIX) 40 MG tablet Take 40 mg by mouth daily. Patient not taking: Reported on 10/04/2023 06/20/21   [provider]  predniSONE (DELTASONE) 20 MG tablet  Take 1 tablet (20 mg total) by mouth daily with breakfast. Patient not taking: Reported on 10/04/2023 09/05/23   Loyce Dys, MD  Syringe/Needle, Disp, (SYRINGE 3CC/25GX1") 25G X 1" 3 ML MISC 1 Syringe by Does not apply route every 30 (thirty) days. 07/12/22   Earna Coder, MD   MR HEEL LEFT W WO CONTRAST  Result Date: 10/06/2023 CLINICAL DATA:  Left hindfoot osteomyelitis status post debridement on 07/11/2023 EXAM: MRI OF LOWER LEFT EXTREMITY WITHOUT AND WITH CONTRAST TECHNIQUE: Multiplanar, multisequence MR imaging of the left hindfoot was performed both before and after administration of intravenous contrast. CONTRAST:  7mL GADAVIST GADOBUTROL 1 MMOL/ML IV SOLN COMPARISON:  X-ray 07/02/2023, MRI 06/23/2023 FINDINGS: Technical Note: Despite efforts by the technologist and patient, motion artifact is present on today's exam and could not be eliminated. This reduces exam sensitivity and specificity. Bones/Joint/Cartilage Postsurgical changes from prior partial calcaneal resection with excision of the plantar calcaneal spur. Along the plantar surface of the posterior calcaneus is a area of focal bone marrow edema and enhancement with confluent low T1 marrow signal compatible with acute osteomyelitis (series 5, image 12). No additional sites of osteomyelitis are identified. No tibiotalar or or subtalar joint effusion to suggest septic arthritis. No acute fracture. 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 Ligaments Lateral ankle ligaments are chronically torn with thickened joint capsule. No evidence to suggest an acute ligamentous injury. Muscles and Tendons Denervation changes of the lower leg and foot musculature. No tenosynovitis. Soft tissues Plantar soft tissue ulceration. Edematous tissue extends to the underlying cortex of the posterior calcaneus. No organized fluid collections. IMPRESSION:  1. Acute osteomyelitis of the posterior calcaneus. Underlying plantar soft tissue ulceration. 2. 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 Electronically Signed   By: Duanne Guess D.O.   On: 10/06/2023 13:33    Positive ROS: All other systems have been reviewed and were otherwise negative with the exception of those mentioned in the HPI and as above.  12 point ROS was performed.  Physical Exam: General: Alert and oriented.  No apparent distress.  Vascular:  Left foot:Dorsalis Pedis:  diminished Posterior Tibial:  diminished  Right foot: Dorsalis Pedis:  diminished Posterior Tibial:  diminished  Neuro:absent protective sensation  Derm: There was a tiny pinpoint wound under a small scab in the plantar left heel.  There is no active purulent drainage at all.  There is no surrounding erythema.  This is markedly improved from previous.  Ortho/MS: No edema to the left foot.   Assessment: Ulcer plantar left heel History of diabetic foot infection  Plan: Clinically the wound is markedly improved there is no erythema no active drainage.  We will have to continue to monitor given the MRI findings but at this time there is no need for surgical debridement as this wound looks to be almost completely healed up.  I would recommend continue with the wound care this being performed with the silicone border foam dressings to pad the area.  Continue to monitor for infection.  I agree with infectious disease as no need for antibiotics currently.  I will see him in the outpatient clinic in 3 weeks.    Irean Hong, DPM Cell [phone]   10/07/2023 3:41 PM

## 2023-10-07 NOTE — Progress Notes (Signed)
Initial Nutrition Assessment  DOCUMENTATION CODES:   Not applicable  INTERVENTION:   Ensure Max protein supplement po BID, each supplement provides 150kcal and 30g of protein.  MVI po daily   Daily weights  NUTRITION DIAGNOSIS:   Increased nutrient needs related to catabolic illness as evidenced by estimated needs.  GOAL:   Patient will meet greater than or equal to 90% of their needs  MONITOR:   Supplement acceptance, PO intake, Labs, Weight trends, I & O's, Skin  REASON FOR ASSESSMENT:   Consult Assessment of nutrition requirement/status  ASSESSMENT:   67 y/o male with h/o stage IV COPD, NSCLC left upper lobe s/p XRT, anemia, IDDM, HTN, OSA, DDD, chronic back pain, CHF, HLD, AAA, MDD, anxiety, OSA, TIA, gout, PVD and substance abuse who is admitted with chronic respiratory failure with hypoxia.  Met with pt in room today. Pt reports good appetite and oral intake pta and in hospital; pt is eating 100% of meals. Pt reports that he drank a chocolate Ensure this morning but prefers strawberry. Pt reports weight gain pta. Per chart, pt appears to be up ~16lbs pta. Pt is weight stable since admission. RD discussed with pt the importance of adequate nutrition needed to preserve lean muscle. Pt is agreeable to continue supplements.   Medications reviewed and include: aspirin, plavix, heparin, insulin, prednisone, thiamine   Labs reviewed: K 3.7 wnl, BUN 34(H), creat 1.25(H) Wbc- 16.1(H), Hgb 9.1(L), Hct 30.4(L) Cbgs- 110, 144 x 24 hrs  AIC 5.6 wnl- 8/1  NUTRITION - FOCUSED PHYSICAL EXAM:  Flowsheet Row Most Recent Value  Orbital Region No depletion  Upper Arm Region Moderate depletion  Thoracic and Lumbar Region No depletion  Buccal Region No depletion  Temple Region No depletion  Clavicle Bone Region No depletion  Clavicle and Acromion Bone Region No depletion  Scapular Bone Region No depletion  Dorsal Hand Mild depletion  Patellar Region Mild depletion  Anterior  Thigh Region Mild depletion  Posterior Calf Region Mild depletion  Edema (RD Assessment) None  Hair Reviewed  Eyes Reviewed  Mouth Reviewed  Skin Reviewed  Nails Reviewed   Diet Order:   Diet Order             Diet Carb Modified Fluid consistency: Thin; Room service appropriate? 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  Estimated Nutritional Needs:   Kcal:  1800-2100kcal/day  Protein:  90-105g/day  Fluid:  1.7-2.0L/day  Betsey Holiday MS, RD, LDN Please refer to [facility] for RD and/or RD on-call/weekend/after hours pager

## 2023-10-07 NOTE — NC FL2 (Signed)
[place] MEDICAID FL2 LEVEL OF CARE FORM     IDENTIFICATION  Patient Name: Christian Fields Birthdate: 108/08/1956 Sex: male Admission Date (Current Location): 10/04/2023  [facility] and [place] Number:  Chiropodist and Address:         Provider Number: [id number]  Attending Physician Name and Address:  Delfino Lovett, MD  Relative Name and Phone Number:       Current Level of Care: Hospital Recommended Level of Care: Skilled Nursing Facility Prior Approval Number:    Date Approved/Denied:   PASRR Number: [id number]  Discharge Plan: SNF    Current Diagnoses: Patient Active Problem List   Diagnosis Date Noted   Acute hypoxic respiratory failure (HCC) 10/06/2023   Respiratory distress 10/04/2023   Anemia 10/04/2023   Multifocal pneumonia 08/30/2023   Acute osteomyelitis of left calcaneus (HCC) 07/11/2023   Diabetic infection of left foot (HCC) 07/11/2023   History of osteomyelitis 07/10/2023   Cellulitis and abscess of toe of left foot 07/03/2023   PVD (peripheral vascular disease) (HCC) 07/03/2023   Cellulitis of left lower extremity 07/02/2023   Diabetic foot ulcer (HCC) 07/02/2023   HLD (hyperlipidemia) 07/02/2023   Depression with anxiety 07/02/2023   Elevated lactic acid level 07/02/2023   Abnormal LFTs 07/02/2023   COPD (chronic obstructive pulmonary disease) (HCC) 07/02/2023   Diarrhea 07/02/2023   History of ankle fusion (Right) 12/31/2021   Chronic anticoagulation (Plavix) 12/31/2021   Long term prescription benzodiazepine use (Klonopin) 12/31/2021   DDD (degenerative disc disease), cervical 12/31/2021   Pharmacologic therapy 10/31/2021   Disorder of skeletal system 10/31/2021   Problems influencing health status 10/31/2021   Aspiration pneumonia of right lower lobe due to gastric secretions (HCC)    Sepsis (HCC) 08/05/2021   Gout 08/05/2021   Depression 08/05/2021   Chronic heart failure with preserved ejection fraction (HFpEF) (HCC)  08/05/2021   HCAP (healthcare-associated pneumonia) 08/05/2021   Pain of left lower extremity 03/25/2021   Acute on chronic respiratory failure with hypoxia (HCC) 03/08/2021   Septic shock (HCC) 02/26/2021   Polysubstance (excluding opioids) dependence (HCC) 01/30/2021   Acute metabolic encephalopathy 01/29/2021   COPD with acute exacerbation (HCC) 01/29/2021   Iron deficiency anemia 11/27/2020   Severe sepsis (HCC)    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   Left leg swelling 04/28/2020   Confusion    Acute respiratory failure with hypoxia (HCC) 03/26/2020   Pneumonia of both lower lobes due to infectious organism 03/25/2020   Acute renal failure superimposed on stage 2 chronic kidney disease (HCC) 01/13/2020   Cellulitis of left leg 01/12/2020   COVID-19 virus detected 01/12/2020   Diabetic ulcer of left midfoot associated with type 2 diabetes mellitus, with fat layer exposed (HCC) 01/12/2020   Panlobular emphysema (HCC) 01/12/2020   Arthritis 10/29/2019   Hemorrhoids 10/29/2019   Senile nuclear sclerosis, bilateral 02/15/2019   Cellulitis of left upper limb 02/11/2019   AAA (abdominal aortic aneurysm) without rupture 01/15/2019   Localized swelling, mass and lump, upper limb 12/24/2018   Pain in femur 12/24/2018   TIA (transient ischemic attack) 11/18/2018   Cortical age-related cataract of both eyes 11/02/2018   Fracture of multiple ribs 04/20/2018   Overweight (BMI 25.0-29.9) 04/20/2018   Hypertension 07/08/2017   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/08/2017   Hyperlipidemia 07/08/2017   Tobacco use disorder 07/08/2017   Atherosclerosis of native arteries of extremity with intermittent claudication (HCC) 07/08/2017   SOB (shortness of breath) 06/19/2017  [place] MEDICAID FL2 LEVEL OF CARE FORM     IDENTIFICATION  Patient Name: Christian Fields Birthdate: 104/07/1956 Sex: male Admission Date (Current Location): 10/04/2023  [facility] and [place] Number:  Chiropodist and Address:         Provider Number: [id number]  Attending Physician Name and Address:  Delfino Lovett, MD  Relative Name and Phone Number:       Current Level of Care: Hospital Recommended Level of Care: Skilled Nursing Facility Prior Approval Number:    Date Approved/Denied:   PASRR Number: [id number]  Discharge Plan: SNF    Current Diagnoses: Patient Active Problem List   Diagnosis Date Noted   Acute hypoxic respiratory failure (HCC) 10/06/2023   Respiratory distress 10/04/2023   Anemia 10/04/2023   Multifocal pneumonia 08/30/2023   Acute osteomyelitis of left calcaneus (HCC) 07/11/2023   Diabetic infection of left foot (HCC) 07/11/2023   History of osteomyelitis 07/10/2023   Cellulitis and abscess of toe of left foot 07/03/2023   PVD (peripheral vascular disease) (HCC) 07/03/2023   Cellulitis of left lower extremity 07/02/2023   Diabetic foot ulcer (HCC) 07/02/2023   HLD (hyperlipidemia) 07/02/2023   Depression with anxiety 07/02/2023   Elevated lactic acid level 07/02/2023   Abnormal LFTs 07/02/2023   COPD (chronic obstructive pulmonary disease) (HCC) 07/02/2023   Diarrhea 07/02/2023   History of ankle fusion (Right) 12/31/2021   Chronic anticoagulation (Plavix) 12/31/2021   Long term prescription benzodiazepine use (Klonopin) 12/31/2021   DDD (degenerative disc disease), cervical 12/31/2021   Pharmacologic therapy 10/31/2021   Disorder of skeletal system 10/31/2021   Problems influencing health status 10/31/2021   Aspiration pneumonia of right lower lobe due to gastric secretions (HCC)    Sepsis (HCC) 08/05/2021   Gout 08/05/2021   Depression 08/05/2021   Chronic heart failure with preserved ejection fraction (HFpEF) (HCC)  08/05/2021   HCAP (healthcare-associated pneumonia) 08/05/2021   Pain of left lower extremity 03/25/2021   Acute on chronic respiratory failure with hypoxia (HCC) 03/08/2021   Septic shock (HCC) 02/26/2021   Polysubstance (excluding opioids) dependence (HCC) 01/30/2021   Acute metabolic encephalopathy 01/29/2021   COPD with acute exacerbation (HCC) 01/29/2021   Iron deficiency anemia 11/27/2020   Severe sepsis (HCC)    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   Left leg swelling 04/28/2020   Confusion    Acute respiratory failure with hypoxia (HCC) 03/26/2020   Pneumonia of both lower lobes due to infectious organism 03/25/2020   Acute renal failure superimposed on stage 2 chronic kidney disease (HCC) 01/13/2020   Cellulitis of left leg 01/12/2020   COVID-19 virus detected 01/12/2020   Diabetic ulcer of left midfoot associated with type 2 diabetes mellitus, with fat layer exposed (HCC) 01/12/2020   Panlobular emphysema (HCC) 01/12/2020   Arthritis 10/29/2019   Hemorrhoids 10/29/2019   Senile nuclear sclerosis, bilateral 02/15/2019   Cellulitis of left upper limb 02/11/2019   AAA (abdominal aortic aneurysm) without rupture 01/15/2019   Localized swelling, mass and lump, upper limb 12/24/2018   Pain in femur 12/24/2018   TIA (transient ischemic attack) 11/18/2018   Cortical age-related cataract of both eyes 11/02/2018   Fracture of multiple ribs 04/20/2018   Overweight (BMI 25.0-29.9) 04/20/2018   Hypertension 07/08/2017   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/08/2017   Hyperlipidemia 07/08/2017   Tobacco use disorder 07/08/2017   Atherosclerosis of native arteries of extremity with intermittent claudication (HCC) 07/08/2017   SOB (shortness of breath) 06/19/2017  [place] MEDICAID FL2 LEVEL OF CARE FORM     IDENTIFICATION  Patient Name: Christian Fields Birthdate: 106/08/1956 Sex: male Admission Date (Current Location): 10/04/2023  [facility] and [place] Number:  Chiropodist and Address:         Provider Number: [id number]  Attending Physician Name and Address:  Delfino Lovett, MD  Relative Name and Phone Number:       Current Level of Care: Hospital Recommended Level of Care: Skilled Nursing Facility Prior Approval Number:    Date Approved/Denied:   PASRR Number: [id number]  Discharge Plan: SNF    Current Diagnoses: Patient Active Problem List   Diagnosis Date Noted   Acute hypoxic respiratory failure (HCC) 10/06/2023   Respiratory distress 10/04/2023   Anemia 10/04/2023   Multifocal pneumonia 08/30/2023   Acute osteomyelitis of left calcaneus (HCC) 07/11/2023   Diabetic infection of left foot (HCC) 07/11/2023   History of osteomyelitis 07/10/2023   Cellulitis and abscess of toe of left foot 07/03/2023   PVD (peripheral vascular disease) (HCC) 07/03/2023   Cellulitis of left lower extremity 07/02/2023   Diabetic foot ulcer (HCC) 07/02/2023   HLD (hyperlipidemia) 07/02/2023   Depression with anxiety 07/02/2023   Elevated lactic acid level 07/02/2023   Abnormal LFTs 07/02/2023   COPD (chronic obstructive pulmonary disease) (HCC) 07/02/2023   Diarrhea 07/02/2023   History of ankle fusion (Right) 12/31/2021   Chronic anticoagulation (Plavix) 12/31/2021   Long term prescription benzodiazepine use (Klonopin) 12/31/2021   DDD (degenerative disc disease), cervical 12/31/2021   Pharmacologic therapy 10/31/2021   Disorder of skeletal system 10/31/2021   Problems influencing health status 10/31/2021   Aspiration pneumonia of right lower lobe due to gastric secretions (HCC)    Sepsis (HCC) 08/05/2021   Gout 08/05/2021   Depression 08/05/2021   Chronic heart failure with preserved ejection fraction (HFpEF) (HCC)  08/05/2021   HCAP (healthcare-associated pneumonia) 08/05/2021   Pain of left lower extremity 03/25/2021   Acute on chronic respiratory failure with hypoxia (HCC) 03/08/2021   Septic shock (HCC) 02/26/2021   Polysubstance (excluding opioids) dependence (HCC) 01/30/2021   Acute metabolic encephalopathy 01/29/2021   COPD with acute exacerbation (HCC) 01/29/2021   Iron deficiency anemia 11/27/2020   Severe sepsis (HCC)    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   Left leg swelling 04/28/2020   Confusion    Acute respiratory failure with hypoxia (HCC) 03/26/2020   Pneumonia of both lower lobes due to infectious organism 03/25/2020   Acute renal failure superimposed on stage 2 chronic kidney disease (HCC) 01/13/2020   Cellulitis of left leg 01/12/2020   COVID-19 virus detected 01/12/2020   Diabetic ulcer of left midfoot associated with type 2 diabetes mellitus, with fat layer exposed (HCC) 01/12/2020   Panlobular emphysema (HCC) 01/12/2020   Arthritis 10/29/2019   Hemorrhoids 10/29/2019   Senile nuclear sclerosis, bilateral 02/15/2019   Cellulitis of left upper limb 02/11/2019   AAA (abdominal aortic aneurysm) without rupture 01/15/2019   Localized swelling, mass and lump, upper limb 12/24/2018   Pain in femur 12/24/2018   TIA (transient ischemic attack) 11/18/2018   Cortical age-related cataract of both eyes 11/02/2018   Fracture of multiple ribs 04/20/2018   Overweight (BMI 25.0-29.9) 04/20/2018   Hypertension 07/08/2017   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/08/2017   Hyperlipidemia 07/08/2017   Tobacco use disorder 07/08/2017   Atherosclerosis of native arteries of extremity with intermittent claudication (HCC) 07/08/2017   SOB (shortness of breath) 06/19/2017

## 2023-10-08 DIAGNOSIS — I5032 Chronic diastolic (congestive) heart failure: Secondary | ICD-10-CM

## 2023-10-08 DIAGNOSIS — D72828 Other elevated white blood cell count: Secondary | ICD-10-CM

## 2023-10-08 DIAGNOSIS — F17200 Nicotine dependence, unspecified, uncomplicated: Secondary | ICD-10-CM

## 2023-10-08 DIAGNOSIS — Z794 Long term (current) use of insulin: Secondary | ICD-10-CM

## 2023-10-08 DIAGNOSIS — E119 Type 2 diabetes mellitus without complications: Secondary | ICD-10-CM

## 2023-10-08 DIAGNOSIS — C3492 Malignant neoplasm of unspecified part of left bronchus or lung: Secondary | ICD-10-CM

## 2023-10-08 DIAGNOSIS — R0603 Acute respiratory distress: Secondary | ICD-10-CM | POA: Diagnosis not present

## 2023-10-08 DIAGNOSIS — J441 Chronic obstructive pulmonary disease with (acute) exacerbation: Secondary | ICD-10-CM | POA: Diagnosis not present

## 2023-10-08 DIAGNOSIS — Z7189 Other specified counseling: Secondary | ICD-10-CM | POA: Diagnosis not present

## 2023-10-08 LAB — CBC
HCT: 32.4 % — ABNORMAL LOW (ref 39.0–52.0)
Hemoglobin: 9.6 g/dL — ABNORMAL LOW (ref 13.0–17.0)
MCH: 27.9 pg (ref 26.0–34.0)
MCHC: 29.6 g/dL — ABNORMAL LOW (ref 30.0–36.0)
MCV: 94.2 fL (ref 80.0–100.0)
Platelets: 357 10*3/uL (ref 150–400)
RBC: 3.44 MIL/uL — ABNORMAL LOW (ref 4.22–5.81)
RDW: 15.4 % (ref 11.5–15.5)
WBC: 14 10*3/uL — ABNORMAL HIGH (ref 4.0–10.5)
nRBC: 0.1 % (ref 0.0–0.2)

## 2023-10-08 LAB — BASIC METABOLIC PANEL
Anion gap: 7 (ref 5–15)
BUN: 38 mg/dL — ABNORMAL HIGH (ref 8–23)
CO2: 31 mmol/L (ref 22–32)
Calcium: 8.7 mg/dL — ABNORMAL LOW (ref 8.9–10.3)
Chloride: 102 mmol/L (ref 98–111)
Creatinine, Ser: 1.15 mg/dL (ref 0.61–1.24)
GFR, Estimated: >60 mL/min (ref >60)
Glucose, Bld: 116 mg/dL — ABNORMAL HIGH (ref 70–99)
Potassium: 4.2 mmol/L (ref 3.5–5.1)
Sodium: 140 mmol/L (ref 135–145)

## 2023-10-08 LAB — MAGNESIUM: Magnesium: 1.9 mg/dL (ref 1.7–2.4)

## 2023-10-08 LAB — GLUCOSE, CAPILLARY
Glucose-Capillary: 101 mg/dL — ABNORMAL HIGH (ref 70–99)
Glucose-Capillary: 101 mg/dL — ABNORMAL HIGH (ref 70–99)
Glucose-Capillary: 155 mg/dL — ABNORMAL HIGH (ref 70–99)
Glucose-Capillary: 213 mg/dL — ABNORMAL HIGH (ref 70–99)
Glucose-Capillary: 223 mg/dL — ABNORMAL HIGH (ref 70–99)

## 2023-10-08 MED ORDER — DULOXETINE HCL 30 MG PO CPEP
60.0000 mg | ORAL_CAPSULE | Freq: Every day | ORAL | Status: DC
  Administered 2023-10-09: 60 mg via ORAL
  Filled 2023-10-08: qty 2

## 2023-10-08 MED ORDER — ESCITALOPRAM OXALATE 10 MG PO TABS
5.0000 mg | ORAL_TABLET | Freq: Every day | ORAL | Status: DC
  Administered 2023-10-08 – 2023-10-09 (×2): 5 mg via ORAL
  Filled 2023-10-08 (×2): qty 1

## 2023-10-08 NOTE — Progress Notes (Addendum)
Physical Therapy Treatment Patient Details Name: Christian Fields MRN: [id number] DOB: [birth year] Today's Date: 10/08/2023   History of Present Illness Pt is a 67 y/o M admitted on 10/04/23 after presenting with c/o worsening cough, SOB, & respiratory distress. PMH: COPD Gold Stage IV, chronic hypoxic respiratory failure on 4L O2, HTN, HLD, DM2, OSA not on CPAP, PVD on aspirin, chronic pain syndrome, non-small lung carcinoma s/p treatment, chronic HFpEF, diabetic neuropathy, anxiety    PT Comments  Pt was pleasant and motivated to participate during the session and put forth good effort throughout. Pt required min physical assistance during transfers and ambulation, good recall of gait sequencing with WB precautions showing surprisingly good control during gait and adherence to precautions. No adverse symptoms reported by pt during therapy, although some fatigue/SOB noted. SpO2 remained WNL, lowest reading of 89% following gait (below) on 6 L O2. HR elevated to 150 bpm immediately following gait. Pt will benefit from continued PT services upon discharge to safely address deficits listed in patient problem list for decreased caregiver assistance and eventual return to PLOF.   If plan is discharge home, recommend the following: A lot of help with walking and/or transfers;A little help with bathing/dressing/bathroom;Assistance with cooking/housework;Direct supervision/assist for medications management;Help with stairs or ramp for entrance;Assist for transportation   Can travel by private vehicle     No  Equipment Recommendations  Other (comment) (pt would benefit from [facility] if remains NWB at d/c)    Recommendations for Other Services       Precautions / Restrictions Precautions Precautions: Fall Restrictions Weight Bearing Restrictions: Yes LLE Weight Bearing: Non weight bearing Other Position/Activity Restrictions: per MD note, 6L O2 with activity     Mobility  Bed Mobility Overal bed  mobility:  (N/A pt found in chair)               Patient Response: Impulsive  Transfers Overall transfer level: Needs assistance Equipment used: Rolling walker (2 wheels) Transfers: Sit to/from Stand Sit to Stand: Min assist Stand pivot transfers: Contact guard assist         General transfer comment: Multiple sit>stands completed with min physical assistance; RW height adjusted to promote upright trunk posture; multimodal cuing given for WB compliance, hand placement,     Ambulation/Gait Ambulation/Gait assistance: Contact guard assist Gait Distance (Feet): 10 Feet, 5 ft forward, 5 ft backward Assistive device: Rolling walker (2 wheels) Gait Pattern/deviations:  (hop-to-gait pattern) Gait velocity: decreased     General Gait Details: Pt exhibited good control, upper extremity strength, and ability to avoid WB on L LE; multimodal cuing for general sequencing and safety   Stairs             Wheelchair Mobility     Tilt Bed Tilt Bed Patient Response: Impulsive  Modified Rankin (Stroke Patients Only)       Balance Overall balance assessment: Needs assistance Sitting-balance support: Bilateral upper extremity supported, Feet supported Sitting balance-Leahy Scale: Normal     Standing balance support: Bilateral upper extremity supported, During functional activity Standing balance-Leahy Scale: Poor Standing balance comment: CGA to prevent posterior LOB upon initial stand, reliant on RW                            Cognition Arousal: Alert Behavior During Therapy: WFL for tasks assessed/performed, Impulsive Overall Cognitive Status: Within Functional Limits for tasks assessed  Exercises      General Comments Pt exhibited some fatigue/SOB with activity, SpO2 WNL >89%, HR up to 150 bpm. Deferred further activity, also requesting need for BM.      Pertinent Vitals/Pain Pain  Assessment Pain Assessment: 0-10 Pain Score: 2  Pain Location: BUE/BLE 2/2 neuropathy Pain Intervention(s): Monitored during session    Home Living                          Prior Function            PT Goals (current goals can now be found in the care plan section) Acute Rehab PT Goals Patient Stated Goal: go to rehab, get better PT Goal Formulation: With patient Time For Goal Achievement: 10/19/23 Potential to Achieve Goals: Good Progress towards PT goals: Progressing toward goals    Frequency    Min 1X/week      PT Plan      Co-evaluation              AM-PAC PT "6 Clicks" Mobility   Outcome Measure  Help needed turning from your back to your side while in a flat bed without using bedrails?: None Help needed moving from lying on your back to sitting on the side of a flat bed without using bedrails?: None Help needed moving to and from a bed to a chair (including a wheelchair)?: A Little Help needed standing up from a chair using your arms (e.g., wheelchair or bedside chair)?: A Little Help needed to walk in hospital room?: A Little Help needed climbing 3-5 steps with a railing? : A Lot 6 Click Score: 19    End of Session Equipment Utilized During Treatment: Gait belt;Oxygen Activity Tolerance: Patient tolerated treatment well Patient left: with call bell/phone within reach;Other (comment) (on [facility]) Nurse Communication: Mobility status;Other (comment) (Pt having BM) PT Visit Diagnosis: Other abnormalities of gait and mobility (R26.89)     Time: 1610-9604 PT Time Calculation (min) (ACUTE ONLY): 17 min  Charges:                           Rosiland Oz SPT 10/08/23, 4:57 PM This entire session was performed under direct supervision and direction of a licensed therapist/therapist assistant. I have personally read, edited and approve of the note as written.  Loran Senters, DPT

## 2023-10-08 NOTE — Progress Notes (Signed)
Pulmonary Medicine          Date: 10/08/2023,   MRN# [id number] Christian Fields [birth year]     AdmissionWeight: 75.7 kg                 CurrentWeight: 75.4 kg       HISTORY OF PRESENT ILLNESS   Today, voiced he is feeling some better. Agree to go to SNF.  Plan out patient w/u for the chest ct findings.  Progressive enlargement of the right upper lobe nodule seen on prior exam, now measuring 17 x 11 mm. While the patient has a history of waxing and waning pulmonary nodularity presumed to be inflammatory or infectious, the persistence since the 08/30/2023 exam is somewhat concerning. PET-CT or interval 3 month follow-up chest CT are recommended for further evaluation. 2. Stable mediastinal and hilar adenopathy, nonspecific. This could also be re-evaluated at the time of follow-up imaging. 3. Stable post therapeutic changes within the left upper lobe, consistent with history of treated lung cancer.   PAST MEDICAL HISTORY   Past Medical History:  Diagnosis Date   Anemia    Anxiety    Arthritis    Asthma    Cancer (HCC)    Basal Cell Skin Cancer   Chronic back pain    COPD (chronic obstructive pulmonary disease) (HCC)    Depression    Diabetes mellitus (HCC)    Dyspnea    GERD (gastroesophageal reflux disease)    Gout    Gout    Headache    History of blood clots    Left Leg--July 2018   History of kidney stones    Hyperlipidemia    Hyperlipidemia    Hypertension    Kidney stones    Neuropathy    On home oxygen therapy    2 L / M   Pneumonia 06/2017   Sleep apnea    Ulcer of foot (HCC)    Right     SURGICAL HISTORY   Past Surgical History:  Procedure Laterality Date   APPENDECTOMY     DG FEET 2 VIEWS BILAT     IRRIGATION AND DEBRIDEMENT FOOT Left 07/11/2023   Procedure: IRRIGATION AND DEBRIDEMENT FOOT;  Surgeon: Gwyneth Revels, DPM;  Location: [facility];  Service: Orthopedics/Podiatry;  Laterality: Left;   LIPOMA EXCISION Right 08/15/2017    Procedure: EXCISION TUMOR(CYST) FOOT;  Surgeon: Recardo Evangelist, DPM;  Location: [facility];  Service: Podiatry;  Laterality: Right;   LOWER EXTREMITY ANGIOGRAPHY Left 07/03/2023   Procedure: Lower Extremity Angiography;  Surgeon: Annice Needy, MD;  Location: [facility];  Service: Cardiovascular;  Laterality: Left;   OTHER SURGICAL HISTORY Bilateral Foot surgery     FAMILY HISTORY   Family History  Problem Relation Age of Onset   Hypertension Mother      SOCIAL HISTORY   Social History   Tobacco Use   Smoking status: Former    Current packs/day: 0.50    Average packs/day: 0.5 packs/day for 6.8 years (3.4 ttl pk-yrs)    Types: Cigarettes    Start date: 2018   Smokeless tobacco: Never  Vaping Use   Vaping status: Never Used  Substance Use Topics   Alcohol use: Yes    Alcohol/week: 1.0 standard drink of alcohol    Types: 1 Cans of beer per week    Comment: occ   Drug use: No     MEDICATIONS    Home Medication:    Current  Medication:  Current Facility-Administered Medications:    acetaminophen (TYLENOL) tablet 650 mg, 650 mg, Oral, Q6H PRN **OR** acetaminophen (TYLENOL) suppository 650 mg, 650 mg, Rectal, Q6H PRN, Allena Katz, Ekta V, MD   albuterol (PROVENTIL) (2.5 MG/3ML) 0.083% nebulizer solution 2.5 mg, 2.5 mg, Inhalation, Q4H PRN, Gertha Calkin, MD   aspirin EC tablet 81 mg, 81 mg, Oral, Daily, Irena Cords V, MD, 81 mg at 10/08/23 0846   buprenorphine-naloxone (SUBOXONE) 8-2 mg per SL tablet 1 tablet, 1 tablet, Sublingual, TID, Delfino Lovett, MD, 1 tablet at 10/08/23 0843   clonazePAM (KLONOPIN) disintegrating tablet 1 mg, 1 mg, Oral, BID, Delfino Lovett, MD, 1 mg at 10/08/23 0845   clopidogrel (PLAVIX) tablet 75 mg, 75 mg, Oral, Daily, Irena Cords V, MD, 75 mg at 10/08/23 0846   [START ON 10/09/2023] DULoxetine (CYMBALTA) DR capsule 60 mg, 60 mg, Oral, Daily, Sreeram, Narendranath, MD   escitalopram (LEXAPRO) tablet 5 mg, 5 mg, Oral, Daily, Sreeram, Narendranath,  MD   gabapentin (NEURONTIN) capsule 600 mg, 600 mg, Oral, TID, Delfino Lovett, MD, 600 mg at 10/08/23 0845   guaiFENesin (MUCINEX) 12 hr tablet 600 mg, 600 mg, Oral, BID PRN, Gertha Calkin, MD   heparin injection 5,000 Units, 5,000 Units, Subcutaneous, Q8H, Delfino Lovett, MD, 5,000 Units at 10/08/23 4235   hydrALAZINE (APRESOLINE) injection 5 mg, 5 mg, Intravenous, Q4H PRN, Gertha Calkin, MD   influenza vaccine adjuvanted (FLUAD) injection 0.5 mL, 0.5 mL, Intramuscular, Tomorrow-1000, Sherryll Burger, Vipul, MD   insulin aspart (novoLOG) injection 0-5 Units, 0-5 Units, Subcutaneous, QHS, Sherryll Burger, Vipul, MD, 2 Units at 10/05/23 2200   insulin aspart (novoLOG) injection 0-9 Units, 0-9 Units, Subcutaneous, TID WC, Delfino Lovett, MD, 2 Units at 10/08/23 1213   ipratropium (ATROVENT) nebulizer solution 0.5 mg, 0.5 mg, Nebulization, BID, Sherryll Burger, Vipul, MD, 0.5 mg at 10/08/23 0809   levalbuterol (XOPENEX) nebulizer solution 1.25 mg, 1.25 mg, Nebulization, BID, Sherryll Burger, Vipul, MD, 1.25 mg at 10/08/23 0809   metoprolol succinate (TOPROL-XL) 24 hr tablet 50 mg, 50 mg, Oral, Daily, Irena Cords V, MD, 50 mg at 10/08/23 0846   multivitamin with minerals tablet 1 tablet, 1 tablet, Oral, Daily, Delfino Lovett, MD, 1 tablet at 10/08/23 0845   protein supplement (ENSURE MAX) liquid, 11 oz, Oral, BID, Delfino Lovett, MD, 11 oz at 10/08/23 0847   roflumilast (DALIRESP) tablet 500 mcg, 500 mcg, Oral, Daily, Irena Cords V, MD, 500 mcg at 10/08/23 0845   sodium chloride flush (NS) 0.9 % injection 3 mL, 3 mL, Intravenous, Q12H, Gertha Calkin, MD, 3 mL at 10/08/23 0847   thiamine (VITAMIN B1) tablet 100 mg, 100 mg, Oral, Daily, Lowella Bandy, RPH, 100 mg at 10/08/23 0845    ALLERGIES   Bee venom, Linezolid, and Azithromycin     REVIEW OF SYSTEMS    Review of Systems:  Gen:  Denies  fever, sweats, chills weigh loss  HEENT: Denies blurred vision, double vision, ear pain, eye pain, hearing loss, nose bleeds, sore throat Cardiac:  No  dizziness, chest pain or heaviness, chest tightness,edema Resp:   Less cough+ less shortness of breath and ,wheezing at rest, no, hemoptysis,  Gi: Denies swallowing difficulty, stomach pain, nausea or vomiting, diarrhea, constipation, bowel incontinence Gu:  Denies bladder incontinence, burning urine Ext:   Denies Joint pain, stiffness or swelling Skin: Denies  skin rash, easy bruising or bleeding or hives Endoc:  Denies polyuria, polydipsia , polyphagia or weight change Psych:   Denies depression, insomnia or hallucinations  Other:  All other systems negative   VS: BP 116/68 (BP Location: Right Arm)   Pulse 81   Temp 97.7 F (36.5 C) (Oral)   Resp 18   Ht 5\' 7"  (1.702 m)   Wt 75.4 kg   SpO2 97%   BMI 26.03 kg/m      PHYSICAL EXAM    GENERAL:NAD, no fevers, chills, no weakness no fatigue HEAD: Normocephalic, atraumatic.  EYES: Pupils equal, round, reactive to light. Extraocular muscles intact. No scleral icterus.  MOUTH: Moist mucosal membrane. Dentition intact. No abscess noted.  EAR, NOSE, THROAT: Clear without exudates. No external lesions.  NECK: Supple. No thyromegaly. No nodules. No JVD.  PULMONARY: less wheezing, no use of accessory muscles, speaking in full sentences CARDIOVASCULAR: S1 and S2. Regular rate and rhythm. No murmurs, rubs, or gallops. No edema. Pedal pulses 2+ bilaterally.  GASTROINTESTINAL: Soft, nontender, nondistended. No masses. Positive bowel sounds. No hepatosplenomegaly.  MUSCULOSKELETAL: No swelling, clubbing, or edema. Range of motion full in all extremities.  NEUROLOGIC: Cranial nerves II through XII are intact. No gross focal neurological deficits. Sensation intact. Reflexes intact.  SKIN: No ulceration, lesions, rashes, or cyanosis. Skin warm and dry. Turgor intact.  PSYCHIATRIC: Mood, affect within normal limits. The patient is awake, alert and oriented x 3. Insight, judgment intact.       IMAGING    MR HEEL LEFT W WO  CONTRAST  Result Date: 10/06/2023 CLINICAL DATA:  Left hindfoot osteomyelitis status post debridement on 07/11/2023 EXAM: MRI OF LOWER LEFT EXTREMITY WITHOUT AND WITH CONTRAST TECHNIQUE: Multiplanar, multisequence MR imaging of the left hindfoot was performed both before and after administration of intravenous contrast. CONTRAST:  7mL GADAVIST GADOBUTROL 1 MMOL/ML IV SOLN COMPARISON:  X-ray 07/02/2023, MRI 06/23/2023 FINDINGS: Technical Note: Despite efforts by the technologist and patient, motion artifact is present on today's exam and could not be eliminated. This reduces exam sensitivity and specificity. Bones/Joint/Cartilage Postsurgical changes from prior partial calcaneal resection with excision of the plantar calcaneal spur. Along the plantar surface of the posterior calcaneus is a area of focal bone marrow edema and enhancement with confluent low T1 marrow signal compatible with acute osteomyelitis (series 5, image 12). No additional sites of osteomyelitis are identified. No tibiotalar or or subtalar joint effusion to suggest septic arthritis. No acute fracture. 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 Ligaments Lateral ankle ligaments are chronically torn with thickened joint capsule. No evidence to suggest an acute ligamentous injury. Muscles and Tendons Denervation changes of the lower leg and foot musculature. No tenosynovitis. Soft tissues Plantar soft tissue ulceration. Edematous tissue extends to the underlying cortex of the posterior calcaneus. No organized fluid collections. IMPRESSION: 1. Acute osteomyelitis of the posterior calcaneus. Underlying plantar soft tissue ulceration. 2. 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 Electronically Signed   By: Duanne Guess D.O.   On: 10/06/2023 13:33   CT CHEST W CONTRAST  Result Date: 10/04/2023 CLINICAL  DATA:  Short of breath, COPD exacerbation, respiratory distress EXAM: CT CHEST WITH CONTRAST TECHNIQUE: Multidetector CT imaging of the chest was performed during intravenous contrast administration. RADIATION DOSE REDUCTION: This exam was performed according to the departmental dose-optimization program which includes automated exposure control, adjustment of the mA and/or kV according to patient size and/or use of iterative reconstruction technique. CONTRAST:  75mL OMNIPAQUE IOHEXOL 300 MG/ML  SOLN COMPARISON:  08/26/2023, 08/30/2023, 10/04/2023 FINDINGS: Cardiovascular:  The heart is unremarkable without pericardial effusion. Dilated main pulmonary arteries consistent with pulmonary arterial hypertension, stable. No evidence of thoracic aortic aneurysm or dissection. Atherosclerosis of the aorta and coronary vasculature again noted. Mediastinum/Nodes: Thyroid, trachea, and esophagus are unremarkable. Stable mediastinal and hilar adenopathy. Index precarinal lymph node measures up to 15 mm reference image 70/2, unchanged since prior exam. Lungs/Pleura: Diffuse emphysema again noted. Stable subpleural masslike density within the left upper lobe measuring 1.6 x 2.0 cm reference image 50/2, likely site of prior treated lung cancer. Interval enlargement of the left upper lobe nodularity seen on prior exam, now measuring 17 x 11 mm reference image 51/3, previously measuring 7 x 6 mm on the 08/26/2023 exam. The punctate subpleural nodules within the right middle lobe on prior study are unchanged. No new areas of airspace disease, effusion, or pneumothorax. Chronic areas of scarring at the lung bases, most pronounced within the lingula. Central airways are patent. Upper Abdomen: No acute abnormality. Musculoskeletal: No acute or destructive bony abnormalities. Stable mild wedge compression deformity of the T5 vertebral body. Stable spondylosis at the thoracolumbar junction. Reconstructed images demonstrate no additional  findings. IMPRESSION: 1. Progressive enlargement of the right upper lobe nodule seen on prior exam, now measuring 17 x 11 mm. While the patient has a history of waxing and waning pulmonary nodularity presumed to be inflammatory or infectious, the persistence since the 08/30/2023 exam is somewhat concerning. PET-CT or interval 3 month follow-up chest CT are recommended for further evaluation. 2. Stable mediastinal and hilar adenopathy, nonspecific. This could also be re-evaluated at the time of follow-up imaging. 3. Stable post therapeutic changes within the left upper lobe, consistent with history of treated lung cancer. 4. Aortic Atherosclerosis (ICD10-I70.0) and Emphysema (ICD10-J43.9). 5. Stable enlarged pulmonary arteries consistent with pulmonary arterial hypertension. Electronically Signed   By: Sharlet Salina M.D.   On: 10/04/2023 21:11   DG Chest Portable 1 View  Result Date: 10/04/2023 CLINICAL DATA:  SOB EXAM: PORTABLE CHEST 1 VIEW COMPARISON:  August 30, 2023 FINDINGS: The cardiomediastinal silhouette is unchanged in contour.Atherosclerotic calcifications. No pleural effusion. No pneumothorax. Similar appearance of bilateral nodular opacities, better evaluated on recent chest CT. RIGHT-sided predominant bronchitic markings and mild bronchial wall cuffing. Emphysematous changes. IMPRESSION: 1. RIGHT-sided predominant bronchitic markings and mild bronchial wall cuffing. This could reflect bronchitis. 2. Similar appearance of bilateral nodular opacities, better evaluated on recent chest CT. Electronically Signed   By: Meda Klinefelter M.D.   On: 10/04/2023 17:31      ASSESSMENT/PLAN   This is an ex smoker who hase severe copd. Her with copd exacerbation, complicated by anxiety,. Presently seem to be stable on present regimen at rest. But desat precipitantly with activity. Today he has made some progress. Will stay the course.  He has abnormal changes on chest ct. Prior hx of lung cancer s/p  radiatioin. Stable LUL changes ( S/P XRT FOR LUNG CANCER), RUL enlarging lesion is concerning.  Will follow on an out patient basis. Oncology also following. Today in bed side chair, looks comfortable. Pulmonary wise ok to go to SNF  -5 liters at rest, increase to 6 liters with activity -physical therapy -on d/c will ad to his regimen ohtuvayre nebs -po steroids and wean -eguivalent to trelegy/albuterol as you doing -continue  daleresp -incentive spiro/ flutter valve -out patient pet scan planned regarding the chest ct findings -to sick for diagnostic bronch at this time following -further orders per above     Thank you for  allowing me to participate in the care of this patient.   Patient/Family are satisfied with care plan and all questions have been answered.  This document was prepared using Dragon voice recognition software and may include unintentional dictation errors.     Ned Clines, M.D.  Division of Pulmonary & Critical Care Medicine  [facility]

## 2023-10-08 NOTE — TOC Progression Note (Signed)
Transition of Care [facility]) - Progression Note    Patient Details  Name: Christian Fields MRN: [id number] Date of Birth: [birth year]  Transition of Care [facility]) CM/SW Contact  Chapman Fitch, RN Phone Number: 10/08/2023, 10:48 AM  Clinical Narrative:     Met with patient at bedside and presented bed offers Patient accepts bed at [facility].  Accepted in Hub and notified Tammy at [facility]  Auth started in [place] portal Its not showing as pending, but I called and spoke to [place] and they confirmed they do have the auth and the system will update at a later time  Notified Kenney Houseman at [facility] he will not be returning at discharge  Expected Discharge Plan: Skilled Nursing Facility    Expected Discharge Plan and Services                                               Social Determinants of Health (SDOH) Interventions SDOH Screenings   Food Insecurity: No Food Insecurity (10/05/2023)  Housing: Low Risk  (10/05/2023)  Transportation Needs: No Transportation Needs (10/05/2023)  Utilities: Not At Risk (10/05/2023)  Financial Resource Strain: Low Risk  (04/19/2023)   Received from [facility]  Tobacco Use: Medium Risk (10/05/2023)    Readmission Risk Interventions    10/07/2023    1:48 PM 09/03/2023    2:13 PM 07/12/2023   10:17 AM  Readmission Risk Prevention Plan  Transportation Screening Complete Complete Complete  PCP or Specialist Appt within 3-5 Days   Complete  HRI or Home Care Consult   Complete  Social Work Consult for Recovery Care Planning/Counseling   Complete  Palliative Care Screening   Not Applicable  Medication Review Oceanographer) Complete Complete Complete  PCP or Specialist appointment within 3-5 days of discharge  Complete   HRI or Home Care Consult Complete Complete   SW Recovery Care/Counseling Consult  Complete   Palliative Care Screening Not Applicable Not Applicable   Skilled Nursing Facility Complete Not  Applicable

## 2023-10-08 NOTE — Plan of Care (Signed)
  Problem: Education: Goal: Knowledge of disease or condition will improve Outcome: Progressing Goal: Knowledge of the prescribed therapeutic regimen will improve Outcome: Progressing Goal: Individualized Educational Video(s) Outcome: Progressing   Problem: Activity: Goal: Ability to tolerate increased activity will improve Outcome: Progressing Goal: Will verbalize the importance of balancing activity with adequate rest periods Outcome: Progressing   Problem: Respiratory: Goal: Ability to maintain a clear airway will improve Outcome: Progressing Goal: Levels of oxygenation will improve Outcome: Progressing Goal: Ability to maintain adequate ventilation will improve Outcome: Progressing   Problem: Education: Goal: Knowledge of General Education information will improve Description: Including pain rating scale, medication(s)/side effects and non-pharmacologic comfort measures Outcome: Progressing   Problem: Health Behavior/Discharge Planning: Goal: Ability to manage health-related needs will improve Outcome: Progressing   Problem: Clinical Measurements: Goal: Ability to maintain clinical measurements within normal limits will improve Outcome: Progressing Goal: Will remain free from infection Outcome: Progressing Goal: Diagnostic test results will improve Outcome: Progressing Goal: Respiratory complications will improve Outcome: Progressing Goal: Cardiovascular complication will be avoided Outcome: Progressing   Problem: Activity: Goal: Risk for activity intolerance will decrease Outcome: Progressing   Problem: Nutrition: Goal: Adequate nutrition will be maintained Outcome: Progressing   Problem: Coping: Goal: Level of anxiety will decrease Outcome: Progressing   Problem: Elimination: Goal: Will not experience complications related to bowel motility Outcome: Progressing Goal: Will not experience complications related to urinary retention Outcome: Progressing    Problem: Pain Management: Goal: General experience of comfort will improve Outcome: Progressing   Problem: Safety: Goal: Ability to remain free from injury will improve Outcome: Progressing   Problem: Skin Integrity: Goal: Risk for impaired skin integrity will decrease Outcome: Progressing   Problem: Education: Goal: Ability to describe self-care measures that may prevent or decrease complications (Diabetes Survival Skills Education) will improve Outcome: Progressing Goal: Individualized Educational Video(s) Outcome: Progressing   Problem: Coping: Goal: Ability to adjust to condition or change in health will improve Outcome: Progressing   Problem: Fluid Volume: Goal: Ability to maintain a balanced intake and output will improve Outcome: Progressing   Problem: Health Behavior/Discharge Planning: Goal: Ability to identify and utilize available resources and services will improve Outcome: Progressing Goal: Ability to manage health-related needs will improve Outcome: Progressing   Problem: Metabolic: Goal: Ability to maintain appropriate glucose levels will improve Outcome: Progressing   Problem: Nutritional: Goal: Maintenance of adequate nutrition will improve Outcome: Progressing Goal: Progress toward achieving an optimal weight will improve Outcome: Progressing   Problem: Skin Integrity: Goal: Risk for impaired skin integrity will decrease Outcome: Progressing   Problem: Tissue Perfusion: Goal: Adequacy of tissue perfusion will improve Outcome: Progressing

## 2023-10-08 NOTE — Progress Notes (Signed)
Progress Note   Patient: Christian Fields ZOX:[id number] DOB: 05-23-1956 DOA: 10/04/2023     4 DOS: the patient was seen and examined on 10/08/2023   Brief hospital course: 67 y.o. male with medical history significant for COPD Gold stage IV, chronic hypoxic respiratory failure on 4 L continuously, HTN, HLD, IIDM, OSA not on CPAP, PVD on aspirin Plavix chronic pain syndrome on Suboxone non-small cell lung carcinoma status post treatment, COPD stage II, chronic HFpEF, diabetic neuropathy admitted for worsening shortness of breath   10/27: DC telemetry, transfer to any MedSurg, podiatry and palliative care consult 10/28: MRI of the left heel shows Acute osteomyelitis of the posterior calcaneus.  ID and pulmonary consult.  Started IV Rocephin.  Nonweightbearing on left foot 10/29: PT and OT eval recommends SNF.  Podiatry and ID does not recommend any antibiotics or intervention needed for left heel wound. Palliative team discussed goals of care, he wishes to be full code. 10/30: Awaiting insurance auth for SNF.   Assessment and Plan: * Respiratory distress COPD with acute exacerbation (HCC) Chronic respiratory failure with hypoxia (HCC) Due to COPD on 4L supplemental o2. Weaned off bipap. Continue supplemental O2 4L while at rest and can bump to 6L with exertion. Continue oral steroid therapy with taper. Changed Duonebs & albuterol to Ipratropium and Xopenex on account of tachycardia. Continue DALIRESP. Pulmonary follow up appreciated. Mucinex & other supportive care per orders. Blood cultures so far negative. Encourage incentive spirometry.   Chronic heart failure with preserved ejection fraction (HFpEF) (HCC) Well compensated for now.   LVEF of 55 to 60%. Euvolemic.   Acute kidney injury: Pre-renal. Avoid nephrotoxic drugs. Renally adjust his meds.  Hypermagnesemia Repeat mag level added to prior labs.   Chronic pain syndrome Home regimen - Suboxone TID, gabapentin 800 mg PO  QID Currently on gabapentin dose to 600 mg p.o. 3 times daily due to renal function.  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 Follows with Dr. Ether Griffins - likely missed outpatient appointment as he was admitted at [facility].   Non-weight-bearing on LLE Wound care per [facility] team. MRI of the left heel/ankle shows acute osteomyelitis of the left calcaneus ID and podiatry input appreciated.  The wound has improved from before.  No need for antibiotic or any wound debridement from podiatry standpoint.  Leukocytosis- due to steroids.  Continue to taper oral steroids.   Insulin dependent type 2 diabetes mellitus (HCC) Sliding scale Novolog for now   Anemia of chronic disease Stable H&H   Anxiety and depression Continue Cymbalta Continue Klonopin for now      Out of bed to chair. Incentive spirometry. Nursing supportive care. Fall, aspiration precautions. DVT prophylaxis   Code Status: Full Code  Subjective: Patient is seen and examined today morning. He is sitting in chair. He is on 4L supplemental o2. Eating well. Awaiting insurance auth for SNF placement. Asks if he can go to facility with his daughter.  Physical Exam: Vitals:   10/07/23 2015 10/08/23 0348 10/08/23 0600 10/08/23 0825  BP:  126/68  116/68  Pulse:  78  81  Resp:  18  18  Temp:  98.1 F (36.7 C)  97.7 F (36.5 C)  TempSrc:  Oral  Oral  SpO2: 96% 94%  97%  Weight:   75.4 kg   Height:        General - Elderly Caucasian male, mild  respiratory distress HEENT - PERRLA, EOMI, atraumatic head, non tender sinuses. Lung - Clear, basal rales, and diffuse wheezes. Heart - S1, S2 heard, no murmurs, rubs, no pedal edema. Abdomen - Soft, non tender, bowel sounds good Neuro - Alert, awake and oriented x 3, non focal exam. Skin - Warm and dry.  Data Reviewed:      Latest Ref Rng & Units  10/08/2023    5:14 AM 10/07/2023    4:58 AM 10/06/2023    5:53 AM  CBC  WBC 4.0 - 10.5 K/uL 14.0  16.1  21.1   Hemoglobin 13.0 - 17.0 g/dL 9.6  9.1  8.8   Hematocrit 39.0 - 52.0 % 32.4  30.4  29.1   Platelets 150 - 400 K/uL 357  309  294       Latest Ref Rng & Units 10/08/2023    5:14 AM 10/07/2023    4:58 AM 10/06/2023    5:53 AM  BMP  Glucose 70 - 99 mg/dL 295  621  308   BUN 8 - 23 mg/dL 38  34  33   Creatinine 0.61 - 1.24 mg/dL 6.57  8.46  9.62   Sodium 135 - 145 mmol/L 140  139  138   Potassium 3.5 - 5.1 mmol/L 4.2  3.7  4.1   Chloride 98 - 111 mmol/L 102  100  101   CO2 22 - 32 mmol/L 31  32  30   Calcium 8.9 - 10.3 mg/dL 8.7  8.2  8.3    No results found.    Family Communication: Discussed with patient, he understands and agrees. All questions answereed.  Disposition: Status is: Inpatient Remains inpatient appropriate because: insurance auth for SNF placement  Planned Discharge Destination: Skilled nursing facility     Time spent: 38 minutes  Author: Marcelino Duster, MD 10/08/2023 11:13 AM Secure chat 7am to 7pm For on call review [email or web address].

## 2023-10-08 NOTE — Progress Notes (Signed)
Daily Progress Note   Patient Name: Christian Fields       Date: 10/08/2023 DOB: Apr 20, 1956  Age: 67 y.o. MRN#: [id number] Attending Physician: Marcelino Duster, MD Primary Care Physician: Alease Medina, MD Admit Date: 10/04/2023  Reason for Consultation/Follow-up: Establishing goals of care  Subjective: Notes and labs reviewed. In to see patient; he is sitting in a bedside chair. He discusses going to SNF. He voices frustration in trying to order lunch as he states several of his orders have been incorrect.  He states his daughter came yesterday to visit but he was unable to speak with her about his wishes regarding goals of care.  He has advised that he does not want a dysphagia diet and would like to continue to eat and drink as he wishes. He understands the risk of aspiration pneumonia. He states he would never want a feeding tube. He states he would want temporary ventilator support for up to 2 weeks, but he would not want tracheostomy. He states he still does not believe he would want CPR, but would still like to speak with his family prior to making any formal changes to his care plans. He is aware he can complete advanced directives here if he would like.  Length of Stay: 4  Current Medications: Scheduled Meds:   aspirin EC  81 mg Oral Daily   buprenorphine-naloxone  1 tablet Sublingual TID   clonazepam  1 mg Oral BID   clopidogrel  75 mg Oral Daily   [START ON 10/09/2023] DULoxetine  60 mg Oral Daily   escitalopram  5 mg Oral Daily   gabapentin  600 mg Oral TID   heparin  5,000 Units Subcutaneous Q8H   influenza vaccine adjuvanted  0.5 mL Intramuscular Tomorrow-1000   insulin aspart  0-5 Units Subcutaneous QHS   insulin aspart  0-9 Units Subcutaneous TID WC   ipratropium   0.5 mg Nebulization BID   levalbuterol  1.25 mg Nebulization BID   metoprolol succinate  50 mg Oral Daily   multivitamin with minerals  1 tablet Oral Daily   Ensure Max Protein  11 oz Oral BID   roflumilast  500 mcg Oral Daily   sodium chloride flush  3 mL Intravenous Q12H   thiamine  100 mg Oral Daily    Continuous Infusions:  PRN Meds: acetaminophen **OR** acetaminophen, albuterol, guaiFENesin, hydrALAZINE  Physical Exam Pulmonary:     Effort: Pulmonary effort is normal.  Neurological:     Mental Status: He is alert.             Vital Signs: BP 116/68 (BP Location: Right Arm)   Pulse 81   Temp 97.7 F (36.5 C) (Oral)   Resp 18   Ht 5\' 7"  (1.702 m)   Wt 75.4 kg   SpO2 97%   BMI 26.03 kg/m  SpO2: SpO2: 97 % O2 Device: O2 Device: Nasal Cannula O2 Flow Rate: O2 Flow Rate (L/min): 4 L/min  Intake/output summary:  Intake/Output Summary (Last 24 hours) at 10/08/2023 1418 Last data filed at 10/08/2023 1322 Gross per 24 hour  Intake --  Output 600 ml  Net -600 ml   LBM: Last BM Date : 10/07/23 Baseline Weight: Weight: 75.7 kg Most recent weight: Weight: 75.4 kg     Patient Active Problem List   Diagnosis Date Noted   Acute hypoxic respiratory failure (HCC) 10/06/2023   Respiratory distress 10/04/2023   Anemia 10/04/2023   Multifocal pneumonia 08/30/2023   Acute osteomyelitis of left calcaneus (HCC) 07/11/2023   Diabetic infection of left foot (HCC) 07/11/2023   History of osteomyelitis 07/10/2023   Cellulitis and abscess of toe of left foot 07/03/2023   PVD (peripheral vascular disease) (HCC) 07/03/2023   Cellulitis of left lower extremity 07/02/2023   Diabetic foot ulcer (HCC) 07/02/2023   HLD (hyperlipidemia) 07/02/2023   Depression with anxiety 07/02/2023   Elevated lactic acid level 07/02/2023   Abnormal LFTs 07/02/2023   COPD (chronic obstructive pulmonary disease) (HCC) 07/02/2023   Diarrhea 07/02/2023   History of ankle fusion (Right)  12/31/2021   Chronic anticoagulation (Plavix) 12/31/2021   Long term prescription benzodiazepine use (Klonopin) 12/31/2021   DDD (degenerative disc disease), cervical 12/31/2021   Pharmacologic therapy 10/31/2021   Disorder of skeletal system 10/31/2021   Problems influencing health status 10/31/2021   Aspiration pneumonia of right lower lobe due to gastric secretions (HCC)    Sepsis (HCC) 08/05/2021   Gout 08/05/2021   Depression 08/05/2021   Chronic heart failure with preserved ejection fraction (HFpEF) (HCC) 08/05/2021   HCAP (healthcare-associated pneumonia) 08/05/2021   Pain of left lower extremity 03/25/2021   Acute on chronic respiratory failure with hypoxia (HCC) 03/08/2021   Septic shock (HCC) 02/26/2021   Polysubstance (excluding opioids) dependence (HCC) 01/30/2021   Acute metabolic encephalopathy 01/29/2021   COPD with acute exacerbation (HCC) 01/29/2021   Iron deficiency anemia 11/27/2020   Severe sepsis (HCC)    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   Left leg swelling 04/28/2020   Confusion    Acute respiratory failure with hypoxia (HCC) 03/26/2020   Pneumonia of both lower lobes due to infectious organism 03/25/2020   Acute renal failure superimposed on stage 2 chronic kidney disease (HCC) 01/13/2020   Cellulitis of left leg 01/12/2020   COVID-19 virus detected 01/12/2020   Diabetic ulcer of left midfoot associated with type 2 diabetes mellitus, with fat layer exposed (HCC) 01/12/2020   Panlobular emphysema (HCC) 01/12/2020   Arthritis 10/29/2019   Hemorrhoids 10/29/2019   Senile nuclear sclerosis, bilateral 02/15/2019   Cellulitis of left upper limb 02/11/2019   AAA (abdominal aortic aneurysm) without rupture 01/15/2019   Localized swelling, mass and lump, upper limb 12/24/2018   Pain in femur 12/24/2018   TIA (transient ischemic attack) 11/18/2018   Cortical age-related cataract of both eyes 11/02/2018  Fracture of multiple ribs 04/20/2018   Overweight (BMI  25.0-29.9) 04/20/2018   Hypertension 07/08/2017   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/08/2017   Hyperlipidemia 07/08/2017   Tobacco use disorder 07/08/2017   Atherosclerosis of native arteries of extremity with intermittent claudication (HCC) 07/08/2017   SOB (shortness of breath) 06/19/2017   Acute on chronic respiratory failure with hypoxia and hypercapnia (HCC) 12/15/2016   CAP (community acquired pneumonia) 12/15/2016   Chronic pain syndrome 12/15/2016   Chronically on opiate therapy 12/15/2016   History of kidney stones 12/15/2016   Polypharmacy 12/15/2016   Somnolence 12/15/2016   Foot ulcer (HCC) 01/19/2016   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) 11/29/2015   Other psychoactive substance use, unspecified with withdrawal, uncomplicated (HCC) 11/29/2015   Type 2 diabetes mellitus with foot ulcer (CODE) (HCC) 11/27/2015   Chronic obstructive pulmonary disease, unspecified (HCC) 01/18/2013   Depressive disorder 01/18/2013   Hereditary and idiopathic peripheral neuropathy 01/18/2013   Sleep apnea 01/18/2013   Low back pain 11/23/2010   Acute gouty arthropathy 07/10/2010   Problem related to lifestyle 10/29/2004    Palliative Care Assessment & Plan     Recommendations/Plan: Patient advises he needs to speak with his family prior to making formal changes to goals of care.  He is aware he can complete an AD packet if he wishes.   Code Status:    Code Status Orders  (From admission, onward)           Start     Ordered   10/04/23 2011  Full code  Continuous       Question:  By:  Answer:  Other   10/04/23 2015           Code Status History     Date Active Date Inactive Code Status Order ID Comments User Context   08/30/2023 1344 09/04/2023 2218 Full Code [id number]  Emeline General, MD ED   07/10/2023 1836 07/14/2023 2240 Full Code [id number]  Loyce Dys, MD Inpatient   07/10/2023 1828 07/10/2023 1836 Full Code [id number]  Loyce Dys, MD  Inpatient   07/02/2023 1428 07/04/2023 1548 Full Code [id number]  Lorretta Harp, MD ED   08/05/2021 1855 08/07/2021 1849 Full Code [id number]  Lorretta Harp, MD ED   08/02/2021 2141 08/04/2021 2137 Full Code [id number]  Rometta Emery, MD Inpatient   02/26/2021 2157 03/09/2021 1758 Full Code [id number]  Rust-Chester, Cecelia Byars, NP ED   01/29/2021 2351 02/01/2021 1722 Full Code [id number]  Andris Baumann, MD ED   11/12/2020 2253 11/18/2020 2110 Full Code [id number]  Jacques Navy, MD ED   03/26/2020 0136 03/28/2020 2308 Full Code [id number]  Arvilla Market, DO ED   01/19/2016 2021 01/20/2016 1818 Full Code [id number]  Milagros Loll, MD Inpatient      Advance Directive Documentation    Flowsheet Row Most Recent Value  Type of Advance Directive Healthcare Power of Attorney  Pre-existing out of facility DNR order (yellow form or pink MOST form) --  "MOST" Form in Place? --     Thank you for allowing the Palliative Medicine Team to assist in the care of this patient.   Morton Stall, NP  Please contact Palliative Medicine Team phone at [phone] for questions and concerns.

## 2023-10-09 ENCOUNTER — Encounter: Payer: Self-pay | Admitting: Oncology

## 2023-10-09 ENCOUNTER — Other Ambulatory Visit: Payer: Self-pay

## 2023-10-09 DIAGNOSIS — J9621 Acute and chronic respiratory failure with hypoxia: Secondary | ICD-10-CM | POA: Diagnosis not present

## 2023-10-09 DIAGNOSIS — R0603 Acute respiratory distress: Secondary | ICD-10-CM | POA: Diagnosis not present

## 2023-10-09 DIAGNOSIS — F17200 Nicotine dependence, unspecified, uncomplicated: Secondary | ICD-10-CM | POA: Diagnosis not present

## 2023-10-09 DIAGNOSIS — J441 Chronic obstructive pulmonary disease with (acute) exacerbation: Secondary | ICD-10-CM | POA: Diagnosis not present

## 2023-10-09 DIAGNOSIS — I5032 Chronic diastolic (congestive) heart failure: Secondary | ICD-10-CM | POA: Diagnosis not present

## 2023-10-09 DIAGNOSIS — Z7189 Other specified counseling: Secondary | ICD-10-CM | POA: Diagnosis not present

## 2023-10-09 LAB — CULTURE, BLOOD (ROUTINE X 2)
Culture: NO GROWTH
Culture: NO GROWTH
Special Requests: ADEQUATE

## 2023-10-09 LAB — GLUCOSE, CAPILLARY
Glucose-Capillary: 78 mg/dL (ref 70–99)
Glucose-Capillary: 102 mg/dL — ABNORMAL HIGH (ref 70–99)

## 2023-10-09 LAB — IGE: IgE (Immunoglobulin E), Serum: 663 [IU]/mL — ABNORMAL HIGH (ref 6–495)

## 2023-10-09 MED ORDER — ENSURE MAX PROTEIN PO LIQD
11.0000 [oz_av] | Freq: Two times a day (BID) | ORAL | 2 refills | Status: DC
  Filled 2023-10-09: qty 19800, 30d supply, fill #0

## 2023-10-09 MED ORDER — PREDNISONE 20 MG PO TABS
20.0000 mg | ORAL_TABLET | Freq: Every day | ORAL | Status: DC
Start: 2023-10-09 — End: 2023-10-09
  Administered 2023-10-09: 20 mg via ORAL
  Filled 2023-10-09: qty 1

## 2023-10-09 MED ORDER — VITAMIN B-1 100 MG PO TABS
100.0000 mg | ORAL_TABLET | Freq: Every day | ORAL | 2 refills | Status: DC
  Filled 2023-10-09: qty 30, 30d supply, fill #0

## 2023-10-09 MED ORDER — PREDNISONE 10 MG PO TABS
20.0000 mg | ORAL_TABLET | Freq: Every day | ORAL | 0 refills | Status: AC
Start: 2023-10-09 — End: 2023-10-14
  Filled 2023-10-09: qty 10, 5d supply, fill #0

## 2023-10-09 NOTE — Discharge Summary (Signed)
Physician Discharge Summary   Patient: Christian Fields MRN: [id number] DOB: August 28, 1956  Admit date:     10/04/2023  Discharge date: 10/09/23  Discharge Physician: Marcelino Duster   PCP: Alease Medina, MD   Recommendations at discharge:    PCP follow up in 1 week. Pulmonary follow up as scheduled. Suboxone script to be sent from his primary mental health.  Discharge Diagnoses: Principal Problem:   Respiratory distress Active Problems:   SOB (shortness of breath)   Acute on chronic respiratory failure with hypoxia (HCC)   COPD with acute exacerbation (HCC)   Tobacco use disorder   Chronic heart failure with preserved ejection fraction (HFpEF) (HCC)   NSCLC of left lung (HCC)   Insulin dependent type 2 diabetes mellitus (HCC)   Anemia   Acute hypoxic respiratory failure (HCC)  Resolved Problems:   * No resolved hospital problems. *  Hospital Course: 67 y.o. male with medical history significant for COPD Gold stage IV, chronic hypoxic respiratory failure on 4 L continuously, HTN, HLD, IIDM, OSA not on CPAP, PVD on aspirin Plavix chronic pain syndrome on Suboxone non-small cell lung carcinoma status post treatment, COPD stage II, chronic HFpEF, diabetic neuropathy admitted for worsening shortness of breath   10/27: DC telemetry, transfer to any MedSurg, podiatry and palliative care consult 10/28: MRI of the left heel shows Acute osteomyelitis of the posterior calcaneus.  ID and pulmonary consult.  Started IV Rocephin.  Nonweightbearing on left foot 10/29: PT and OT eval recommends SNF.  Podiatry and ID does not recommend any antibiotics or intervention needed for left heel wound. Palliative team discussed goals of care, he wishes to be full code. 10/30: steroids tapered to 20mg  daily. Awaiting insurance auth for SNF.  10/31: hemodynamically stable to be discharged to SNF. He will need PCP, Pulmonology and mental health follow up.    Assessment and Plan: * Respiratory  distress COPD with acute exacerbation (HCC) Chronic respiratory failure with hypoxia (HCC) Due to COPD on 3-4L supplemental o2. Weaned off bipap. Continue supplemental O2 4L while at rest and can bump to 6L with exertion. Continue oral steroid therapy with taper, prednisone 20mg  for 5 more days. Continue bronchodilator home regimen. Continue DALIRESP. Pulmonary follow up appreciated, outpatient follow up as scheduled. Mucinex & other supportive care per orders. Encourage incentive spirometry.   Chronic heart failure with preserved ejection fraction (HFpEF) (HCC) Well compensated for now.   LVEF of 55 to 60%. Euvolemic.   Acute kidney injury: Pre-renal. Kidney function improved.   Hypermagnesemia Resolved.   Chronic pain syndrome Resumed home regimen - Suboxone TID, gabapentin 200 mg PO QID Suboxone script to be sent from his mental health clinic. Daughter can take it to the facility.   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 Follows with Dr. Ether Griffins - missed outpatient appointment as he was admitted at [facility].   Non-weight-bearing on LLE. Wound care per WOC team. MRI of the left heel/ankle shows acute osteomyelitis of the left calcaneus ID and podiatry input appreciated.  The wound has improved from before.  No need for antibiotic or any wound debridement from podiatry standpoint.   Leukocytosis- due to steroids.  Continue to taper oral steroids.   Insulin dependent type 2 diabetes mellitus (HCC) Home metformin restarted.   Anemia of chronic disease Stable H&H, no active bleeding.   Anxiety and depression Continue Cymbalta, lexapro. Advised mental health follow up.  Nutrition Documentation  Flowsheet Row ED to Hosp-Admission (Current) from 10/04/2023 in [facility]  Nutrition Problem Increased nutrient needs  Etiology catabolic  illness  Nutrition Goal Patient will meet greater than or equal to 90% of their needs       Consultants: Pulmonology Procedures performed: none  Disposition: Skilled nursing facility Diet recommendation:  Discharge Diet Orders (From admission, onward)     Start     Ordered   10/09/23 0000  Diet - low sodium heart healthy        10/09/23 1028           Cardiac and Carb modified diet DISCHARGE MEDICATION: Allergies as of 10/09/2023       Reactions   Bee Venom Anaphylaxis   Linezolid Other (See Comments)   Thrombocytopenia, hyperlactatemia   Azithromycin Itching, Rash        Medication List     STOP taking these medications    amoxicillin-clavulanate 875-125 MG tablet Commonly known as: AUGMENTIN   Farxiga 10 MG Tabs tablet Generic drug: dapagliflozin propanediol       TAKE these medications    acetaminophen 325 MG tablet Commonly known as: TYLENOL Take 650 mg by mouth every 6 (six) hours as needed.   albuterol 108 (90 Base) MCG/ACT inhaler Commonly known as: VENTOLIN HFA Inhale 2 puffs into the lungs every 4 (four) hours as needed for wheezing or shortness of breath.   allopurinol 300 MG tablet Commonly known as: ZYLOPRIM Take 600 mg by mouth daily.   aspirin EC 81 MG tablet Take 1 tablet by mouth daily.   azelastine 0.1 % nasal spray Commonly known as: ASTELIN Place 2 sprays into both nostrils 2 (two) times daily.   bisacodyl 5 MG EC tablet Generic drug: bisacodyl Take 1 tablet (5 mg total) by mouth daily as needed for moderate constipation.   Buprenorphine HCl-Naloxone HCl 8-2 MG Film Place 1 Film under the tongue every 8 (eight) hours.   clopidogrel 75 MG tablet Commonly known as: PLAVIX Take 1 tablet (75 mg total) by mouth daily.   cyanocobalamin 1000 MCG/ML injection Commonly known as: VITAMIN B12 Inject 1 mL (1,000 mcg total) into the muscle every 30 (thirty) days.   Dextromethorphan-guaiFENesin 10-100 MG/5ML liquid Take 5 mLs  by mouth every 4 (four) hours as needed (cough).   DULoxetine 60 MG capsule Commonly known as: CYMBALTA Take 60 mg by mouth daily. What changed: Another medication with the same name was removed. Continue taking this medication, and follow the directions you see here.   Ensure Max Protein Liqd Take 330 mLs (11 oz total) by mouth 2 (two) times daily.   escitalopram 5 MG tablet Commonly known as: LEXAPRO Take 5 mg by mouth daily.   ezetimibe 10 MG tablet Commonly known as: ZETIA Take 10 mg by mouth daily.   fluticasone 50 MCG/ACT nasal spray Commonly known as: FLONASE Place 1 spray into both nostrils daily as needed for allergies.   gabapentin 100 MG capsule Commonly known as: NEURONTIN Take 200 mg by mouth 3 (three) times daily.   ipratropium-albuterol 0.5-2.5 (3) MG/3ML Soln Commonly known as: DUONEB Take 3 mLs by nebulization every 6 (six) hours as needed (wheezing).   levalbuterol 0.63 MG/3ML nebulizer solution Commonly known as: XOPENEX Take 3 mLs (0.63 mg total) by nebulization every 6 (six) hours as needed for wheezing or shortness of breath.   metFORMIN 1000 MG tablet Commonly known as: GLUCOPHAGE Take 1,000 mg by mouth daily.   metoprolol  succinate 50 MG 24 hr tablet Commonly known as: TOPROL-XL Take 50 mg by mouth daily.   MULTIVITAMIN ADULT (MINERALS) PO Take 1 tablet by mouth every morning.   ondansetron 4 MG disintegrating tablet Commonly known as: ZOFRAN-ODT Take 4 mg by mouth every 8 (eight) hours as needed for nausea.   OXYGEN Inhale 2 L into the lungs.   pantoprazole 40 MG tablet Commonly known as: PROTONIX Take 40 mg by mouth daily.   predniSONE 10 MG tablet Commonly known as: DELTASONE Take 2 tablets (20 mg total) by mouth daily for 5 days. What changed:  medication strength when to take this   roflumilast 500 MCG Tabs tablet Commonly known as: DALIRESP Take 500 mcg by mouth daily.   simvastatin 40 MG tablet Commonly known as:  ZOCOR Take 40 mg by mouth at bedtime.   SYRINGE 3CC/25GX1" 25G X 1" 3 ML Misc 1 Syringe by Does not apply route every 30 (thirty) days.   thiamine 100 MG tablet Commonly known as: Vitamin B-1 Take 1 tablet (100 mg total) by mouth daily. Start taking on: October 10, 2023   traZODone 50 MG tablet Commonly known as: DESYREL Take 50 mg by mouth at bedtime.   Trelegy Ellipta 100-62.5-25 MCG/ACT Aepb Generic drug: Fluticasone-Umeclidin-Vilant Inhale 1 puff into the lungs in the morning.               Discharge Care Instructions  (From admission, onward)           Start     Ordered   10/09/23 0000  Discharge wound care:       Comments: cleanse wounds to left plantar foot near heel with VASHE cleanser daily. Cover with foam dressing.   10/09/23 1028            Contact information for after-discharge care     Destination     [facility] Preferred SNF .   Service: Skilled Nursing Contact information: [address] [place] [address] [phone]                    Discharge Exam: [name] Weights   10/07/23 0500 10/08/23 0600 10/09/23 0500  Weight: 75.4 kg 75.4 kg 76.5 kg   General - Elderly Caucasian male, mild respiratory distress with exertion. HEENT - PERRLA, EOMI, atraumatic head, non tender sinuses. Lung - Clear, basal rales, and diffuse wheezes. Heart - S1, S2 heard, no murmurs, rubs, no pedal edema. Abdomen - Soft, non tender, bowel sounds good Neuro - Alert, awake and oriented x 3, non focal exam. Skin - Warm and dry.  Condition at discharge: stable  The results of significant diagnostics from this hospitalization (including imaging, microbiology, ancillary and laboratory) are listed below for reference.   Imaging Studies: MR HEEL LEFT W WO CONTRAST  Result Date: 10/06/2023 CLINICAL DATA:  Left hindfoot osteomyelitis status post debridement on 07/11/2023 EXAM: MRI OF LOWER LEFT EXTREMITY WITHOUT  AND WITH CONTRAST TECHNIQUE: Multiplanar, multisequence MR imaging of the left hindfoot was performed both before and after administration of intravenous contrast. CONTRAST:  7mL GADAVIST GADOBUTROL 1 MMOL/ML IV SOLN COMPARISON:  X-ray 07/02/2023, MRI 06/23/2023 FINDINGS: Technical Note: Despite efforts by the technologist and patient, motion artifact is present on today's exam and could not be eliminated. This reduces exam sensitivity and specificity. Bones/Joint/Cartilage Postsurgical changes from prior partial calcaneal resection with excision of the plantar calcaneal spur. Along the plantar surface of the posterior calcaneus is a area of  focal bone marrow edema and enhancement with confluent low T1 marrow signal compatible with acute osteomyelitis (series 5, image 12). No additional sites of osteomyelitis are identified. No tibiotalar or or subtalar joint effusion to suggest septic arthritis. No acute fracture. 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 Ligaments Lateral ankle ligaments are chronically torn with thickened joint capsule. No evidence to suggest an acute ligamentous injury. Muscles and Tendons Denervation changes of the lower leg and foot musculature. No tenosynovitis. Soft tissues Plantar soft tissue ulceration. Edematous tissue extends to the underlying cortex of the posterior calcaneus. No organized fluid collections. IMPRESSION: 1. Acute osteomyelitis of the posterior calcaneus. Underlying plantar soft tissue ulceration. 2. 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 Electronically Signed   By: Duanne Guess D.O.   On: 10/06/2023 13:33   CT CHEST W CONTRAST  Result Date: 10/04/2023 CLINICAL DATA:  Short of breath, COPD exacerbation, respiratory distress EXAM: CT CHEST WITH CONTRAST TECHNIQUE: Multidetector CT imaging of the chest was performed  during intravenous contrast administration. RADIATION DOSE REDUCTION: This exam was performed according to the departmental dose-optimization program which includes automated exposure control, adjustment of the mA and/or kV according to patient size and/or use of iterative reconstruction technique. CONTRAST:  75mL OMNIPAQUE IOHEXOL 300 MG/ML  SOLN COMPARISON:  08/26/2023, 08/30/2023, 10/04/2023 FINDINGS: Cardiovascular: The heart is unremarkable without pericardial effusion. Dilated main pulmonary arteries consistent with pulmonary arterial hypertension, stable. No evidence of thoracic aortic aneurysm or dissection. Atherosclerosis of the aorta and coronary vasculature again noted. Mediastinum/Nodes: Thyroid, trachea, and esophagus are unremarkable. Stable mediastinal and hilar adenopathy. Index precarinal lymph node measures up to 15 mm reference image 70/2, unchanged since prior exam. Lungs/Pleura: Diffuse emphysema again noted. Stable subpleural masslike density within the left upper lobe measuring 1.6 x 2.0 cm reference image 50/2, likely site of prior treated lung cancer. Interval enlargement of the left upper lobe nodularity seen on prior exam, now measuring 17 x 11 mm reference image 51/3, previously measuring 7 x 6 mm on the 08/26/2023 exam. The punctate subpleural nodules within the right middle lobe on prior study are unchanged. No new areas of airspace disease, effusion, or pneumothorax. Chronic areas of scarring at the lung bases, most pronounced within the lingula. Central airways are patent. Upper Abdomen: No acute abnormality. Musculoskeletal: No acute or destructive bony abnormalities. Stable mild wedge compression deformity of the T5 vertebral body. Stable spondylosis at the thoracolumbar junction. Reconstructed images demonstrate no additional findings. IMPRESSION: 1. Progressive enlargement of the right upper lobe nodule seen on prior exam, now measuring 17 x 11 mm. While the patient has a history  of waxing and waning pulmonary nodularity presumed to be inflammatory or infectious, the persistence since the 08/30/2023 exam is somewhat concerning. PET-CT or interval 3 month follow-up chest CT are recommended for further evaluation. 2. Stable mediastinal and hilar adenopathy, nonspecific. This could also be re-evaluated at the time of follow-up imaging. 3. Stable post therapeutic changes within the left upper lobe, consistent with history of treated lung cancer. 4. Aortic Atherosclerosis (ICD10-I70.0) and Emphysema (ICD10-J43.9). 5. Stable enlarged pulmonary arteries consistent with pulmonary arterial hypertension. Electronically Signed   By: Sharlet Salina M.D.   On: 10/04/2023 21:11   DG Chest Portable 1 View  Result Date: 10/04/2023 CLINICAL DATA:  SOB EXAM: PORTABLE CHEST 1 VIEW COMPARISON:  August 30, 2023 FINDINGS: The cardiomediastinal silhouette is unchanged in contour.Atherosclerotic  calcifications. No pleural effusion. No pneumothorax. Similar appearance of bilateral nodular opacities, better evaluated on recent chest CT. RIGHT-sided predominant bronchitic markings and mild bronchial wall cuffing. Emphysematous changes. IMPRESSION: 1. RIGHT-sided predominant bronchitic markings and mild bronchial wall cuffing. This could reflect bronchitis. 2. Similar appearance of bilateral nodular opacities, better evaluated on recent chest CT. Electronically Signed   By: Meda Klinefelter M.D.   On: 10/04/2023 17:31    Microbiology: Results for orders placed or performed during the hospital encounter of 10/04/23  Blood culture (routine x 2)     Status: None   Collection Time: 10/04/23  4:49 PM   Specimen: BLOOD  Result Value Ref Range Status   Specimen Description BLOOD RIGHT ANTECUBITAL  Final   Special Requests   Final    BOTTLES DRAWN AEROBIC AND ANAEROBIC Blood Culture results may not be optimal due to an excessive volume of blood received in culture bottles   Culture   Final    NO GROWTH 5  DAYS Performed at [facility], [address] [place], [place] [address]    Report Status 10/09/2023 FINAL  Final  Blood culture (routine x 2)     Status: None   Collection Time: 10/04/23  4:49 PM   Specimen: BLOOD  Result Value Ref Range Status   Specimen Description BLOOD BLOOD RIGHT HAND  Final   Special Requests   Final    BOTTLES DRAWN AEROBIC AND ANAEROBIC Blood Culture adequate volume   Culture   Final    NO GROWTH 5 DAYS Performed at [facility], [address] [place], [place] [address]    Report Status 10/09/2023 FINAL  Final  SARS Coronavirus 2 by RT PCR (hospital order, performed in [facility] hospital lab) *cepheid single result test* Anterior Nasal Swab     Status: None   Collection Time: 10/04/23  4:49 PM   Specimen: Anterior Nasal Swab  Result Value Ref Range Status   SARS Coronavirus 2 by RT PCR NEGATIVE NEGATIVE Final    Comment: (NOTE) SARS-CoV-2 target nucleic acids are NOT DETECTED.  The SARS-CoV-2 RNA is generally detectable in upper and lower respiratory specimens during the acute phase of infection. The lowest concentration of SARS-CoV-2 viral copies this assay can detect is 250 copies / mL. A negative result does not preclude SARS-CoV-2 infection and should not be used as the sole basis for treatment or other patient management decisions.  A negative result may occur with improper specimen collection / handling, submission of specimen other than nasopharyngeal swab, presence of viral mutation(s) within the areas targeted by this assay, and inadequate number of viral copies (<250 copies / mL). A negative result must be combined with clinical observations, patient history, and epidemiological information.  Fact Sheet for Patients:   RoadLapTop.co.za  Fact Sheet for Healthcare Providers: http://kim-miller.com/  This test is not yet approved or  cleared by the [place] FDA and has  been authorized for detection and/or diagnosis of SARS-CoV-2 by FDA under an Emergency Use Authorization (EUA).  This EUA will remain in effect (meaning this test can be used) for the duration of the COVID-19 declaration under Section 564(b)(1) of the Act, 21 U.S.C. section 360bbb-3(b)(1), unless the authorization is terminated or revoked sooner.  Performed at [facility], [address] [place], [place] [address]     Labs: CBC: Recent Labs  Lab 10/04/23 1649 10/06/23 0553 10/07/23 0458 10/08/23 0514  WBC 11.1* 21.1* 16.1* 14.0*  NEUTROABS 6.5  --   --   --  HGB 10.1* 8.8* 9.1* 9.6*  HCT 33.2* 29.1* 30.4* 32.4*  MCV 92.2 91.5 93.8 94.2  PLT 293 294 309 357   Basic Metabolic Panel: Recent Labs  Lab 10/04/23 1649 10/06/23 0553 10/07/23 0458 10/08/23 0514  NA 134* 138 139 140  K 3.5 4.1 3.7 4.2  CL 97* 101 100 102  CO2 30 30 32 31  GLUCOSE 165* 151* 128* 116*  BUN 21 33* 34* 38*  CREATININE 1.29* 1.31* 1.25* 1.15  CALCIUM 9.0 8.3* 8.2* 8.7*  MG 3.1*  --   --  1.9   Liver Function Tests: Recent Labs  Lab 10/04/23 1649  AST 28  ALT 20  ALKPHOS 95  BILITOT 0.5  PROT 7.7  ALBUMIN 3.2*   CBG: Recent Labs  Lab 10/08/23 0829 10/08/23 1206 10/08/23 1640 10/08/23 2155 10/09/23 0732  GLUCAP 101* 155* 213* 223* 78    Discharge time spent: 39 minutes.  Signed: Marcelino Duster, MD [facility] 10/09/2023

## 2023-10-09 NOTE — Plan of Care (Signed)
Adequate for transfer to SNF

## 2023-10-09 NOTE — Progress Notes (Signed)
Daily Progress Note   Patient Name: Christian Fields       Date: 10/09/2023 DOB: 12/20/1955  Age: 67 y.o. MRN#: [id number] Attending Physician: Marcelino Duster, MD Primary Care Physician: Alease Medina, MD Admit Date: 10/04/2023  Reason for Consultation/Follow-up: Establishing goals of care  Subjective: Notes and labs reviewed.  In to see patient.  He is currently sitting in bedside chair fully dressed, and states he will be discharging to [facility] today, as should be leaving soon. Patient's phone rang and he wished to take the phone call.  PMT will sign off as patient is discharging to SNF today.   Length of Stay: 5  Current Medications: Scheduled Meds:   aspirin EC  81 mg Oral Daily   buprenorphine-naloxone  1 tablet Sublingual TID   clonazepam  1 mg Oral BID   clopidogrel  75 mg Oral Daily   DULoxetine  60 mg Oral Daily   escitalopram  5 mg Oral Daily   gabapentin  600 mg Oral TID   heparin  5,000 Units Subcutaneous Q8H   insulin aspart  0-5 Units Subcutaneous QHS   insulin aspart  0-9 Units Subcutaneous TID WC   ipratropium  0.5 mg Nebulization BID   levalbuterol  1.25 mg Nebulization BID   metoprolol succinate  50 mg Oral Daily   multivitamin with minerals  1 tablet Oral Daily   predniSONE  20 mg Oral Q breakfast   Ensure Max Protein  11 oz Oral BID   roflumilast  500 mcg Oral Daily   sodium chloride flush  3 mL Intravenous Q12H   thiamine  100 mg Oral Daily    Continuous Infusions:   PRN Meds: acetaminophen **OR** acetaminophen, albuterol, guaiFENesin, hydrALAZINE  Physical Exam Pulmonary:     Effort: Pulmonary effort is normal.  Neurological:     Mental Status: He is alert.             Vital Signs: BP (!) 144/77 (BP Location: Right Arm)    Pulse 87   Temp 98.2 F (36.8 C)   Resp 18   Ht 5\' 7"  (1.702 m)   Wt 76.5 kg   SpO2 97%   BMI 26.41 kg/m  SpO2: SpO2: 97 % O2 Device: O2 Device: Nasal Cannula O2 Flow Rate: O2 Flow Rate (L/min): 5  L/min  Intake/output summary:  Intake/Output Summary (Last 24 hours) at 10/09/2023 1219 Last data filed at 10/09/2023 0900 Gross per 24 hour  Intake 240 ml  Output 875 ml  Net -635 ml   LBM: Last BM Date : 10/08/23 Baseline Weight: Weight: 75.7 kg Most recent weight: Weight: 76.5 kg     Patient Active Problem List   Diagnosis Date Noted   Acute hypoxic respiratory failure (HCC) 10/06/2023   Respiratory distress 10/04/2023   Anemia 10/04/2023   Multifocal pneumonia 08/30/2023   Acute osteomyelitis of left calcaneus (HCC) 07/11/2023   Diabetic infection of left foot (HCC) 07/11/2023   History of osteomyelitis 07/10/2023   Cellulitis and abscess of toe of left foot 07/03/2023   PVD (peripheral vascular disease) (HCC) 07/03/2023   Cellulitis of left lower extremity 07/02/2023   Diabetic foot ulcer (HCC) 07/02/2023   HLD (hyperlipidemia) 07/02/2023   Depression with anxiety 07/02/2023   Elevated lactic acid level 07/02/2023   Abnormal LFTs 07/02/2023   COPD (chronic obstructive pulmonary disease) (HCC) 07/02/2023   Diarrhea 07/02/2023   History of ankle fusion (Right) 12/31/2021   Chronic anticoagulation (Plavix) 12/31/2021   Long term prescription benzodiazepine use (Klonopin) 12/31/2021   DDD (degenerative disc disease), cervical 12/31/2021   Pharmacologic therapy 10/31/2021   Disorder of skeletal system 10/31/2021   Problems influencing health status 10/31/2021   Aspiration pneumonia of right lower lobe due to gastric secretions (HCC)    Sepsis (HCC) 08/05/2021   Gout 08/05/2021   Depression 08/05/2021   Chronic heart failure with preserved ejection fraction (HFpEF) (HCC) 08/05/2021   HCAP (healthcare-associated pneumonia) 08/05/2021   Pain of left lower extremity  03/25/2021   Acute on chronic respiratory failure with hypoxia (HCC) 03/08/2021   Septic shock (HCC) 02/26/2021   Polysubstance (excluding opioids) dependence (HCC) 01/30/2021   Acute metabolic encephalopathy 01/29/2021   COPD with acute exacerbation (HCC) 01/29/2021   Iron deficiency anemia 11/27/2020   Severe sepsis (HCC)    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   Left leg swelling 04/28/2020   Confusion    Acute respiratory failure with hypoxia (HCC) 03/26/2020   Pneumonia of both lower lobes due to infectious organism 03/25/2020   Acute renal failure superimposed on stage 2 chronic kidney disease (HCC) 01/13/2020   Cellulitis of left leg 01/12/2020   COVID-19 virus detected 01/12/2020   Diabetic ulcer of left midfoot associated with type 2 diabetes mellitus, with fat layer exposed (HCC) 01/12/2020   Panlobular emphysema (HCC) 01/12/2020   Arthritis 10/29/2019   Hemorrhoids 10/29/2019   Senile nuclear sclerosis, bilateral 02/15/2019   Cellulitis of left upper limb 02/11/2019   AAA (abdominal aortic aneurysm) without rupture 01/15/2019   Localized swelling, mass and lump, upper limb 12/24/2018   Pain in femur 12/24/2018   TIA (transient ischemic attack) 11/18/2018   Cortical age-related cataract of both eyes 11/02/2018   Fracture of multiple ribs 04/20/2018   Overweight (BMI 25.0-29.9) 04/20/2018   Hypertension 07/08/2017   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/08/2017   Hyperlipidemia 07/08/2017   Tobacco use disorder 07/08/2017   Atherosclerosis of native arteries of extremity with intermittent claudication (HCC) 07/08/2017   SOB (shortness of breath) 06/19/2017   Acute on chronic respiratory failure with hypoxia and hypercapnia (HCC) 12/15/2016   CAP (community acquired pneumonia) 12/15/2016   Chronic pain syndrome 12/15/2016   Chronically on opiate therapy 12/15/2016   History of kidney stones 12/15/2016   Polypharmacy 12/15/2016   Somnolence 12/15/2016  Foot ulcer (HCC) 01/19/2016   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) 11/29/2015   Other psychoactive substance use, unspecified with withdrawal, uncomplicated (HCC) 11/29/2015   Type 2 diabetes mellitus with foot ulcer (CODE) (HCC) 11/27/2015   Chronic obstructive pulmonary disease, unspecified (HCC) 01/18/2013   Depressive disorder 01/18/2013   Hereditary and idiopathic peripheral neuropathy 01/18/2013   Sleep apnea 01/18/2013   Low back pain 11/23/2010   Acute gouty arthropathy 07/10/2010   Problem related to lifestyle 10/29/2004    Palliative Care Assessment & Plan     Recommendations/Plan: PMT will sign off as patient is discharging to SNF today.  Code Status:    Code Status Orders  (From admission, onward)           Start     Ordered   10/04/23 2011  Full code  Continuous       Question:  By:  Answer:  Other   10/04/23 2015           Code Status History     Date Active Date Inactive Code Status Order ID Comments User Context   08/30/2023 1344 09/04/2023 2218 Full Code [id number]  Emeline General, MD ED   07/10/2023 1836 07/14/2023 2240 Full Code [id number]  Loyce Dys, MD Inpatient   07/10/2023 1828 07/10/2023 1836 Full Code [id number]  Loyce Dys, MD Inpatient   07/02/2023 1428 07/04/2023 1548 Full Code [id number]  Lorretta Harp, MD ED   08/05/2021 1855 08/07/2021 1849 Full Code [id number]  Lorretta Harp, MD ED   08/02/2021 2141 08/04/2021 2137 Full Code [id number]  Rometta Emery, MD Inpatient   02/26/2021 2157 03/09/2021 1758 Full Code [id number]  Rust-Chester, Cecelia Byars, NP ED   01/29/2021 2351 02/01/2021 1722 Full Code [id number]  Andris Baumann, MD ED   11/12/2020 2253 11/18/2020 2110 Full Code [id number]  Jacques Navy, MD ED   03/26/2020 0136 03/28/2020 2308 Full Code [id number]  Arvilla Market, DO ED   01/19/2016 2021 01/20/2016 1818 Full Code [id number]  Milagros Loll, MD Inpatient      Advance Directive Documentation    Flowsheet Row Most Recent  Value  Type of Advance Directive Healthcare Power of Attorney  Pre-existing out of facility DNR order (yellow form or pink MOST form) --  "MOST" Form in Place? --       Thank you for allowing the Palliative Medicine Team to assist in the care of this patient.   Morton Stall, NP  Please contact Palliative Medicine Team phone at [phone] for questions and concerns.

## 2023-10-09 NOTE — Care Management Important Message (Signed)
Important Message  Patient Details  Name: TOREE DILLAHUNT MRN: [id number] Date of Birth: [birth year]   Important Message Given:  Yes - Medicare IM     Olegario Messier A Brixon Zhen 10/09/2023, 12:03 PM

## 2023-10-09 NOTE — Progress Notes (Signed)
Occupational Therapy Treatment Patient Details Name: Christian Fields MRN: [id number] DOB: 10/21/1956 Today's Date: 10/09/2023   History of present illness Pt is a 67 y/o M admitted on 10/04/23 after presenting with c/o worsening cough, SOB, & respiratory distress. PMH: COPD Gold Stage IV, chronic hypoxic respiratory failure on 4L O2, HTN, HLD, DM2, OSA not on CPAP, PVD on aspirin, chronic pain syndrome, non-small cell lung carcinoma s/p treatment, chronic HFpEF, diabetic neuropathy, anxiety   OT comments  Chart reviewed prior to tx session. Pt seen for OT treatment on this date. Upon arrival to room pt awake in bed. Tx session targeted improved ADL activity tolerance and independence. Pt was alert and oriented throughout session. Pt requires CGA+RW for all OOB mobility, supervision only for transition from supine<>EOB. Pt stand pivot t/f to [facility], acted impulsively to get to [facility] despite verbal cues to wait. Pt required [facility] for courtesy wipe post BM. Transferred BSC <> recliner step pivot with RW + CGA. Pt completed full body dressing to prepare for d/c, requiring MINA and supervision throughout. Oxygen level monitored throughout, pt SOB spo2 88% on 4 L post mobility, increased oxygen to 5L via [place], pt consistently upper 90s throughout rest of tx. Pt making good progress toward goals, will continue to follow POC. Discharge recommendation remains appropriate. OT will follow acutely.           If plan is discharge home, recommend the following:  A little help with walking and/or transfers;Assistance with cooking/housework;Assist for transportation;Help with stairs or ramp for entrance;A little help with bathing/dressing/bathroom   Equipment Recommendations  BSC/3in1    Recommendations for Other Services      Precautions / Restrictions Precautions Precautions: Fall Restrictions Weight Bearing Restrictions: Yes LLE Weight Bearing: Non weight bearing Other Position/Activity Restrictions: per MD  note, 6L O2 with activity       Mobility Bed Mobility Overal bed mobility: Needs Assistance Bed Mobility: Supine to Sit     Supine to sit: Supervision          Transfers Overall transfer level: Needs assistance Equipment used: Rolling walker (2 wheels) Transfers: Sit to/from Stand Sit to Stand: Contact guard assist Stand pivot transfers: Contact guard assist Step pivot transfers: BSC <>recliner with RW + CGA         General transfer comment: Amb ~27ft with RW+CGA     Balance Overall balance assessment: Needs assistance Sitting-balance support: Feet supported, No upper extremity supported Sitting balance-Leahy Scale: Normal     Standing balance support: During functional activity, Single extremity supported Standing balance-Leahy Scale: Fair                             ADL either performed or assessed with clinical judgement   ADL Overall ADL's : Needs assistance/impaired                 Upper Body Dressing : Supervision/safety;Sitting   Lower Body Dressing: Sit to/from stand;Minimal assistance   Toilet Transfer: Set up;BSC/3in1;Stand-pivot;Rolling walker (2 wheels);Contact guard assist Toilet Transfer Details (indicate cue type and reason): very impulsive, adhered to NBW precautions Toileting- Clothing Manipulation and Hygiene: Minimal assistance;Sit to/from stand;Supervision/safety Toileting - Clothing Manipulation Details (indicate cue type and reason): Courtesy wipe post pt completing peri care after BM     Functional mobility during ADLs: Set up;Rolling walker (2 wheels);Contact guard assist      Extremity/Trunk Assessment  Vision       Perception     Praxis      Cognition Arousal: Alert Behavior During Therapy: WFL for tasks assessed/performed, Impulsive Overall Cognitive Status: No family/caregiver present to determine baseline cognitive functioning Area of Impairment: Safety/judgement                          Safety/Judgement: Decreased awareness of deficits, Decreased awareness of safety              Exercises      Shoulder Instructions       General Comments      Pertinent Vitals/ Pain       Pain Assessment Pain Assessment: Faces Faces Pain Scale: No hurt  Home Living                                          Prior Functioning/Environment              Frequency  Min 1X/week        Progress Toward Goals  OT Goals(current goals can now be found in the care plan section)  Progress towards OT goals: Progressing toward goals  Acute Rehab OT Goals Patient Stated Goal: to go home. OT Goal Formulation: With patient Time For Goal Achievement: 10/18/23 Potential to Achieve Goals: Good ADL Goals Pt Will Perform Grooming: with supervision;with set-up;standing Pt Will Perform Lower Body Dressing: with set-up;with supervision;sit to/from stand Pt Will Transfer to Toilet: with supervision;regular height toilet;ambulating  Plan      Co-evaluation                 AM-PAC OT "6 Clicks" Daily Activity     Outcome Measure   Help from another person eating meals?: None Help from another person taking care of personal grooming?: None Help from another person toileting, which includes using toliet, bedpan, or urinal?: A Little Help from another person bathing (including washing, rinsing, drying)?: A Little Help from another person to put on and taking off regular upper body clothing?: None Help from another person to put on and taking off regular lower body clothing?: A Little 6 Click Score: 21    End of Session Equipment Utilized During Treatment: Rolling walker (2 wheels);Oxygen  OT Visit Diagnosis: Unsteadiness on feet (R26.81);Other abnormalities of gait and mobility (R26.89);Muscle weakness (generalized) (M62.81)   Activity Tolerance Patient tolerated treatment well   Patient Left in chair;with call bell/phone within  reach;with chair alarm set   Nurse Communication Mobility status;Other (comment) (vitals/oxygen)        Time: 3235-5732 OT Time Calculation (min): 39 min  Charges: OT General Charges $OT Visit: 1 Visit OT Treatments $Self Care/Home Management : 23-37 mins $Therapeutic Activity: 8-22 mins  [employer], OTS

## 2023-10-09 NOTE — TOC Transition Note (Signed)
Transition of Care [facility]) - CM/SW Discharge Note   Patient Details  Name: Christian Fields MRN: [id number] Date of Birth: 06/01/1956  [facility] CM/SW Contact:  Chapman Fitch, RN Phone Number: 10/09/2023, 11:35 AM   Clinical Narrative:     [name] obtained for [facility]   Patient will [facility] to: [facility]  Anticipated DC date: 10/09/23  Family notified:Daughter [name]  Transport by: [place] Mon   Per MD patient ready for DC to . RN, patient, patient's family, and facility notified of DC. Discharge Summary sent to facility. RN given number for report.   Per MD he is unable to write printed script for suboxone, that  script will have to come from his outpatient provider.  Daughter confirms she will obtain script and take to [facility].  Daughter and patient aware that if he does not have the script medication can not be administered.  Confirmed with Tammy at [facility] that he can bring script from outpatient provider   TOC signing off.   Final next level of care: Skilled Nursing Facility     Patient Goals and CMS Choice      Discharge Placement                         Discharge Plan and Services Additional resources added to the After Visit Summary for                                       Social Determinants of Health (SDOH) Interventions SDOH Screenings   Food Insecurity: No Food Insecurity (10/05/2023)  Housing: Low Risk  (10/05/2023)  Transportation Needs: No Transportation Needs (10/05/2023)  Utilities: Not At Risk (10/05/2023)  Financial Resource Strain: Low Risk  (04/19/2023)   Received from [facility], [facility]  Tobacco Use: Medium Risk (10/05/2023)     Readmission Risk Interventions    10/09/2023   11:34 AM 10/07/2023    1:48 PM 09/03/2023    2:13 PM  Readmission Risk Prevention Plan  Transportation Screening  Complete Complete  Medication Review [employer])  Complete Complete  PCP or Specialist appointment  within 3-5 days of discharge   Complete  HRI or Home Care Consult  Complete Complete  SW Recovery Care/Counseling Consult Complete  Complete  Palliative Care Screening  Not Applicable Not Applicable  Skilled Nursing Facility  Complete Not Applicable

## 2023-10-10 LAB — ALPHA-1-ANTITRYPSIN PHENOTYP: A-1 Antitrypsin, Ser: 171 mg/dL (ref 101–187)

## 2023-10-16 ENCOUNTER — Inpatient Hospital Stay: Payer: 59

## 2023-10-16 ENCOUNTER — Encounter: Payer: Self-pay | Admitting: Medical Oncology

## 2023-10-16 ENCOUNTER — Emergency Department: Payer: 59

## 2023-10-16 ENCOUNTER — Inpatient Hospital Stay
Admission: EM | Admit: 2023-10-16 | Discharge: 2023-10-22 | DRG: 871 | Disposition: A | Discharge status: Home Health | Payer: 59 | Attending: Internal Medicine | Admitting: Internal Medicine

## 2023-10-16 ENCOUNTER — Other Ambulatory Visit: Payer: Self-pay

## 2023-10-16 DIAGNOSIS — Z79891 Long term (current) use of opiate analgesic: Secondary | ICD-10-CM

## 2023-10-16 DIAGNOSIS — R6520 Severe sepsis without septic shock: Secondary | ICD-10-CM

## 2023-10-16 DIAGNOSIS — R251 Tremor, unspecified: Secondary | ICD-10-CM | POA: Clinically undetermined

## 2023-10-16 DIAGNOSIS — Z91030 Bee allergy status: Secondary | ICD-10-CM

## 2023-10-16 DIAGNOSIS — R000 Tachycardia, unspecified: Secondary | ICD-10-CM | POA: Diagnosis not present

## 2023-10-16 DIAGNOSIS — J440 Chronic obstructive pulmonary disease with acute lower respiratory infection: Secondary | ICD-10-CM | POA: Diagnosis present

## 2023-10-16 DIAGNOSIS — Z751 Person awaiting admission to adequate facility elsewhere: Secondary | ICD-10-CM | POA: Diagnosis not present

## 2023-10-16 DIAGNOSIS — C3492 Malignant neoplasm of unspecified part of left bronchus or lung: Secondary | ICD-10-CM | POA: Diagnosis not present

## 2023-10-16 DIAGNOSIS — J441 Chronic obstructive pulmonary disease with (acute) exacerbation: Secondary | ICD-10-CM | POA: Diagnosis present

## 2023-10-16 DIAGNOSIS — E1122 Type 2 diabetes mellitus with diabetic chronic kidney disease: Secondary | ICD-10-CM | POA: Diagnosis present

## 2023-10-16 DIAGNOSIS — T502X5A Adverse effect of carbonic-anhydrase inhibitors, benzothiadiazides and other diuretics, initial encounter: Secondary | ICD-10-CM | POA: Diagnosis present

## 2023-10-16 DIAGNOSIS — Z87891 Personal history of nicotine dependence: Secondary | ICD-10-CM | POA: Diagnosis not present

## 2023-10-16 DIAGNOSIS — J9602 Acute respiratory failure with hypercapnia: Principal | ICD-10-CM

## 2023-10-16 DIAGNOSIS — Z923 Personal history of irradiation: Secondary | ICD-10-CM

## 2023-10-16 DIAGNOSIS — Z888 Allergy status to other drugs, medicaments and biological substances status: Secondary | ICD-10-CM

## 2023-10-16 DIAGNOSIS — A419 Sepsis, unspecified organism: Secondary | ICD-10-CM | POA: Diagnosis present

## 2023-10-16 DIAGNOSIS — Z881 Allergy status to other antibiotic agents status: Secondary | ICD-10-CM

## 2023-10-16 DIAGNOSIS — I130 Hypertensive heart and chronic kidney disease with heart failure and stage 1 through stage 4 chronic kidney disease, or unspecified chronic kidney disease: Secondary | ICD-10-CM | POA: Diagnosis present

## 2023-10-16 DIAGNOSIS — Z85828 Personal history of other malignant neoplasm of skin: Secondary | ICD-10-CM

## 2023-10-16 DIAGNOSIS — J9601 Acute respiratory failure with hypoxia: Principal | ICD-10-CM

## 2023-10-16 DIAGNOSIS — J9622 Acute and chronic respiratory failure with hypercapnia: Secondary | ICD-10-CM | POA: Diagnosis present

## 2023-10-16 DIAGNOSIS — Z85118 Personal history of other malignant neoplasm of bronchus and lung: Secondary | ICD-10-CM

## 2023-10-16 DIAGNOSIS — N179 Acute kidney failure, unspecified: Secondary | ICD-10-CM | POA: Diagnosis present

## 2023-10-16 DIAGNOSIS — E861 Hypovolemia: Secondary | ICD-10-CM | POA: Diagnosis present

## 2023-10-16 DIAGNOSIS — N182 Chronic kidney disease, stage 2 (mild): Secondary | ICD-10-CM | POA: Diagnosis present

## 2023-10-16 DIAGNOSIS — Z7951 Long term (current) use of inhaled steroids: Secondary | ICD-10-CM

## 2023-10-16 DIAGNOSIS — G894 Chronic pain syndrome: Secondary | ICD-10-CM | POA: Diagnosis present

## 2023-10-16 DIAGNOSIS — I10 Essential (primary) hypertension: Secondary | ICD-10-CM | POA: Diagnosis present

## 2023-10-16 DIAGNOSIS — E785 Hyperlipidemia, unspecified: Secondary | ICD-10-CM | POA: Diagnosis present

## 2023-10-16 DIAGNOSIS — E11621 Type 2 diabetes mellitus with foot ulcer: Secondary | ICD-10-CM | POA: Diagnosis not present

## 2023-10-16 DIAGNOSIS — L97509 Non-pressure chronic ulcer of other part of unspecified foot with unspecified severity: Secondary | ICD-10-CM | POA: Diagnosis not present

## 2023-10-16 DIAGNOSIS — Z8249 Family history of ischemic heart disease and other diseases of the circulatory system: Secondary | ICD-10-CM

## 2023-10-16 DIAGNOSIS — E119 Type 2 diabetes mellitus without complications: Secondary | ICD-10-CM

## 2023-10-16 DIAGNOSIS — F419 Anxiety disorder, unspecified: Secondary | ICD-10-CM

## 2023-10-16 DIAGNOSIS — I5032 Chronic diastolic (congestive) heart failure: Secondary | ICD-10-CM | POA: Diagnosis present

## 2023-10-16 DIAGNOSIS — Z7982 Long term (current) use of aspirin: Secondary | ICD-10-CM | POA: Diagnosis not present

## 2023-10-16 DIAGNOSIS — J9621 Acute and chronic respiratory failure with hypoxia: Secondary | ICD-10-CM | POA: Diagnosis present

## 2023-10-16 DIAGNOSIS — G4700 Insomnia, unspecified: Secondary | ICD-10-CM | POA: Diagnosis present

## 2023-10-16 LAB — RESPIRATORY PANEL BY PCR

## 2023-10-16 LAB — RESP PANEL BY RT-PCR (RSV, FLU A&B, COVID)  RVPGX2
Influenza A by PCR: NEGATIVE
Influenza B by PCR: NEGATIVE
Resp Syncytial Virus by PCR: NEGATIVE
SARS Coronavirus 2 by RT PCR: NEGATIVE

## 2023-10-16 LAB — BLOOD GAS, VENOUS
Acid-Base Excess: 6.4 mmol/L — ABNORMAL HIGH (ref 0.0–2.0)
Bicarbonate: 36.8 mmol/L — ABNORMAL HIGH (ref 20.0–28.0)
FIO2: 0.55 %
O2 Saturation: 73.9 %
Patient temperature: 37
pCO2, Ven: 84 mm[Hg] (ref 44–60)
pH, Ven: 7.25 (ref 7.25–7.43)
pO2, Ven: 44 mm[Hg] (ref 32–45)

## 2023-10-16 LAB — EXPECTORATED SPUTUM ASSESSMENT W GRAM STAIN, RFLX TO RESP C

## 2023-10-16 LAB — BASIC METABOLIC PANEL
Anion gap: 10 (ref 5–15)
BUN: 25 mg/dL — ABNORMAL HIGH (ref 8–23)
CO2: 34 mmol/L — ABNORMAL HIGH (ref 22–32)
Calcium: 8.4 mg/dL — ABNORMAL LOW (ref 8.9–10.3)
Chloride: 89 mmol/L — ABNORMAL LOW (ref 98–111)
Creatinine, Ser: 1.44 mg/dL — ABNORMAL HIGH (ref 0.61–1.24)
GFR, Estimated: 53 mL/min — ABNORMAL LOW (ref >60)
Glucose, Bld: 135 mg/dL — ABNORMAL HIGH (ref 70–99)
Potassium: 4 mmol/L (ref 3.5–5.1)
Sodium: 133 mmol/L — ABNORMAL LOW (ref 135–145)

## 2023-10-16 LAB — CBC
HCT: 34.2 % — ABNORMAL LOW (ref 39.0–52.0)
Hemoglobin: 10.2 g/dL — ABNORMAL LOW (ref 13.0–17.0)
MCH: 27.6 pg (ref 26.0–34.0)
MCHC: 29.8 g/dL — ABNORMAL LOW (ref 30.0–36.0)
MCV: 92.7 fL (ref 80.0–100.0)
Platelets: 247 10*3/uL (ref 150–400)
RBC: 3.69 MIL/uL — ABNORMAL LOW (ref 4.22–5.81)
RDW: 15.5 % (ref 11.5–15.5)
WBC: 35.9 10*3/uL — ABNORMAL HIGH (ref 4.0–10.5)
nRBC: 0 % (ref 0.0–0.2)

## 2023-10-16 LAB — LACTIC ACID, PLASMA
Lactic Acid, Venous: 1.2 mmol/L (ref 0.5–1.9)
Lactic Acid, Venous: 1 mmol/L (ref 0.5–1.9)

## 2023-10-16 LAB — BRAIN NATRIURETIC PEPTIDE: B Natriuretic Peptide: 172.4 pg/mL — ABNORMAL HIGH (ref 0.0–100.0)

## 2023-10-16 LAB — CBG MONITORING, ED: Glucose-Capillary: 156 mg/dL — ABNORMAL HIGH (ref 70–99)

## 2023-10-16 LAB — PROCALCITONIN: Procalcitonin: 2.55 ng/mL

## 2023-10-16 MED ORDER — ASPIRIN 81 MG PO TBEC
81.0000 mg | DELAYED_RELEASE_TABLET | Freq: Every day | ORAL | Status: DC
  Administered 2023-10-17 – 2023-10-22 (×6): 81 mg via ORAL
  Filled 2023-10-16 (×6): qty 1

## 2023-10-16 MED ORDER — IPRATROPIUM-ALBUTEROL 0.5-2.5 (3) MG/3ML IN SOLN
3.0000 mL | Freq: Once | RESPIRATORY_TRACT | Status: AC
  Administered 2023-10-16: 3 mL via RESPIRATORY_TRACT
  Filled 2023-10-16: qty 3

## 2023-10-16 MED ORDER — PREDNISONE 20 MG PO TABS: 40.0000 mg | ORAL_TABLET | Freq: Every day | ORAL | Status: DC

## 2023-10-16 MED ORDER — SODIUM CHLORIDE 0.9 % IV SOLN
2.0000 g | Freq: Once | INTRAVENOUS | Status: AC
  Administered 2023-10-16: 2 g via INTRAVENOUS
  Filled 2023-10-16: qty 12.5

## 2023-10-16 MED ORDER — PIPERACILLIN-TAZOBACTAM 3.375 G IVPB
3.3750 g | Freq: Three times a day (TID) | INTRAVENOUS | Status: AC
  Administered 2023-10-16 – 2023-10-19 (×10): 3.375 g via INTRAVENOUS
  Filled 2023-10-16 (×11): qty 50

## 2023-10-16 MED ORDER — DEXTROSE 5 % IV SOLN
500.0000 mg | Freq: Once | INTRAVENOUS | Status: AC
  Administered 2023-10-16: 500 mg via INTRAVENOUS
  Filled 2023-10-16: qty 5

## 2023-10-16 MED ORDER — DOXYCYCLINE HYCLATE 100 MG IV SOLR
100.0000 mg | Freq: Two times a day (BID) | INTRAVENOUS | Status: DC
  Administered 2023-10-17 – 2023-10-19 (×4): 100 mg via INTRAVENOUS
  Filled 2023-10-16 (×7): qty 100

## 2023-10-16 MED ORDER — LACTATED RINGERS IV BOLUS
1000.0000 mL | Freq: Once | INTRAVENOUS | Status: AC
  Administered 2023-10-16: 1000 mL via INTRAVENOUS

## 2023-10-16 MED ORDER — BUDESONIDE 0.5 MG/2ML IN SUSP
0.5000 mg | Freq: Two times a day (BID) | RESPIRATORY_TRACT | Status: DC
  Administered 2023-10-16 – 2023-10-22 (×12): 0.5 mg via RESPIRATORY_TRACT
  Filled 2023-10-16 (×12): qty 2

## 2023-10-16 MED ORDER — TRAZODONE HCL 50 MG PO TABS
50.0000 mg | ORAL_TABLET | Freq: Every evening | ORAL | Status: DC | PRN
  Administered 2023-10-18 – 2023-10-21 (×4): 50 mg via ORAL
  Filled 2023-10-16 (×5): qty 1

## 2023-10-16 MED ORDER — ACETAMINOPHEN 650 MG RE SUPP: 650.0000 mg | Freq: Four times a day (QID) | RECTAL | Status: DC | PRN

## 2023-10-16 MED ORDER — METHYLPREDNISOLONE SODIUM SUCC 125 MG IJ SOLR
125.0000 mg | Freq: Once | INTRAMUSCULAR | Status: AC
  Administered 2023-10-16: 125 mg via INTRAVENOUS
  Filled 2023-10-16: qty 2

## 2023-10-16 MED ORDER — METHYLPREDNISOLONE SODIUM SUCC 40 MG IJ SOLR
40.0000 mg | Freq: Every day | INTRAMUSCULAR | Status: DC
  Administered 2023-10-17 – 2023-10-22 (×6): 40 mg via INTRAVENOUS
  Filled 2023-10-16 (×6): qty 1

## 2023-10-16 MED ORDER — GABAPENTIN 400 MG PO CAPS
400.0000 mg | ORAL_CAPSULE | Freq: Three times a day (TID) | ORAL | Status: DC
Start: 2023-10-16 — End: 2023-10-18
  Administered 2023-10-16 – 2023-10-18 (×6): 400 mg via ORAL
  Filled 2023-10-16 (×6): qty 1

## 2023-10-16 MED ORDER — ONDANSETRON HCL 4 MG PO TABS: 4.0000 mg | ORAL_TABLET | Freq: Four times a day (QID) | ORAL | Status: DC | PRN

## 2023-10-16 MED ORDER — SIMVASTATIN 20 MG PO TABS
40.0000 mg | ORAL_TABLET | Freq: Every day | ORAL | Status: DC
Start: 2023-10-16 — End: 2023-10-22
  Administered 2023-10-16 – 2023-10-21 (×6): 40 mg via ORAL
  Filled 2023-10-16 (×2): qty 2
  Filled 2023-10-16: qty 4
  Filled 2023-10-16: qty 2
  Filled 2023-10-16: qty 4
  Filled 2023-10-16: qty 2

## 2023-10-16 MED ORDER — SODIUM CHLORIDE 0.9 % IV BOLUS
500.0000 mL | Freq: Once | INTRAVENOUS | Status: AC
Start: 2023-10-16 — End: 2023-10-16
  Administered 2023-10-16: 500 mL via INTRAVENOUS

## 2023-10-16 MED ORDER — BUPRENORPHINE HCL-NALOXONE HCL 8-2 MG SL SUBL
1.0000 | SUBLINGUAL_TABLET | Freq: Three times a day (TID) | SUBLINGUAL | Status: DC
  Administered 2023-10-16 – 2023-10-17 (×5): 1 via SUBLINGUAL
  Filled 2023-10-16 (×5): qty 1

## 2023-10-16 MED ORDER — IPRATROPIUM-ALBUTEROL 0.5-2.5 (3) MG/3ML IN SOLN
3.0000 mL | Freq: Four times a day (QID) | RESPIRATORY_TRACT | Status: DC
Start: 2023-10-16 — End: 2023-10-18
  Administered 2023-10-16 – 2023-10-18 (×8): 3 mL via RESPIRATORY_TRACT
  Filled 2023-10-16 (×8): qty 3

## 2023-10-16 MED ORDER — ACETAMINOPHEN 325 MG PO TABS
650.0000 mg | ORAL_TABLET | Freq: Four times a day (QID) | ORAL | Status: DC | PRN
  Administered 2023-10-18 – 2023-10-22 (×3): 650 mg via ORAL
  Filled 2023-10-16 (×4): qty 2

## 2023-10-16 MED ORDER — LACTATED RINGERS IV SOLN: INTRAVENOUS | Status: AC

## 2023-10-16 MED ORDER — DIPHENHYDRAMINE HCL 25 MG PO CAPS
25.0000 mg | ORAL_CAPSULE | Freq: Once | ORAL | Status: AC
Start: 2023-10-16 — End: 2023-10-16
  Administered 2023-10-16: 25 mg via ORAL
  Filled 2023-10-16: qty 1

## 2023-10-16 MED ORDER — ESCITALOPRAM OXALATE 10 MG PO TABS
5.0000 mg | ORAL_TABLET | Freq: Every day | ORAL | Status: DC
  Administered 2023-10-17 – 2023-10-18 (×2): 5 mg via ORAL
  Filled 2023-10-16 (×2): qty 1

## 2023-10-16 MED ORDER — PANTOPRAZOLE SODIUM 40 MG PO TBEC
40.0000 mg | DELAYED_RELEASE_TABLET | Freq: Every day | ORAL | Status: DC
Start: 2023-10-17 — End: 2023-10-22
  Administered 2023-10-17 – 2023-10-22 (×6): 40 mg via ORAL
  Filled 2023-10-16 (×6): qty 1

## 2023-10-16 MED ORDER — THIAMINE MONONITRATE 100 MG PO TABS
100.0000 mg | ORAL_TABLET | Freq: Every day | ORAL | Status: DC
  Administered 2023-10-17 – 2023-10-22 (×6): 100 mg via ORAL
  Filled 2023-10-16 (×6): qty 1

## 2023-10-16 MED ORDER — EZETIMIBE 10 MG PO TABS
10.0000 mg | ORAL_TABLET | Freq: Every day | ORAL | Status: DC
Start: 2023-10-17 — End: 2023-10-22
  Administered 2023-10-17 – 2023-10-22 (×6): 10 mg via ORAL
  Filled 2023-10-16 (×6): qty 1

## 2023-10-16 MED ORDER — ONDANSETRON HCL 4 MG/2ML IJ SOLN
4.0000 mg | Freq: Four times a day (QID) | INTRAMUSCULAR | Status: DC | PRN
Start: 2023-10-16 — End: 2023-10-22
  Administered 2023-10-20 – 2023-10-21 (×2): 4 mg via INTRAVENOUS
  Filled 2023-10-16 (×2): qty 2

## 2023-10-16 MED ORDER — DULOXETINE HCL 30 MG PO CPEP
60.0000 mg | ORAL_CAPSULE | Freq: Every day | ORAL | Status: DC
  Administered 2023-10-17 – 2023-10-22 (×6): 60 mg via ORAL
  Filled 2023-10-16: qty 1
  Filled 2023-10-16 (×5): qty 2

## 2023-10-16 MED ORDER — ALLOPURINOL 100 MG PO TABS
600.0000 mg | ORAL_TABLET | Freq: Every day | ORAL | Status: DC
  Administered 2023-10-17 – 2023-10-22 (×6): 600 mg via ORAL
  Filled 2023-10-16: qty 2
  Filled 2023-10-16 (×5): qty 6

## 2023-10-16 MED ORDER — CLOPIDOGREL BISULFATE 75 MG PO TABS
75.0000 mg | ORAL_TABLET | Freq: Every day | ORAL | Status: DC
  Administered 2023-10-17 – 2023-10-22 (×6): 75 mg via ORAL
  Filled 2023-10-16 (×6): qty 1

## 2023-10-16 MED ORDER — ENOXAPARIN SODIUM 40 MG/0.4ML IJ SOSY
40.0000 mg | PREFILLED_SYRINGE | INTRAMUSCULAR | Status: DC
  Administered 2023-10-16 – 2023-10-22 (×7): 40 mg via SUBCUTANEOUS
  Filled 2023-10-16 (×7): qty 0.4

## 2023-10-16 MED ORDER — ROFLUMILAST 500 MCG PO TABS
500.0000 ug | ORAL_TABLET | Freq: Every day | ORAL | Status: DC
  Administered 2023-10-17 – 2023-10-22 (×6): 500 ug via ORAL
  Filled 2023-10-16 (×6): qty 1

## 2023-10-16 MED ORDER — ONDANSETRON HCL 4 MG/2ML IJ SOLN
4.0000 mg | Freq: Once | INTRAMUSCULAR | Status: AC
  Administered 2023-10-16: 4 mg via INTRAVENOUS
  Filled 2023-10-16: qty 2

## 2023-10-16 MED ORDER — SODIUM CHLORIDE 0.9% FLUSH
3.0000 mL | Freq: Two times a day (BID) | INTRAVENOUS | Status: DC
  Administered 2023-10-16 – 2023-10-22 (×11): 3 mL via INTRAVENOUS

## 2023-10-16 MED ORDER — INSULIN ASPART 100 UNIT/ML IJ SOLN
0.0000 [IU] | Freq: Three times a day (TID) | INTRAMUSCULAR | Status: DC
  Administered 2023-10-16 – 2023-10-17 (×3): 3 [IU] via SUBCUTANEOUS
  Administered 2023-10-17: 8 [IU] via SUBCUTANEOUS
  Administered 2023-10-18: 5 [IU] via SUBCUTANEOUS
  Administered 2023-10-18 – 2023-10-20 (×6): 2 [IU] via SUBCUTANEOUS
  Administered 2023-10-20: 3 [IU] via SUBCUTANEOUS
  Administered 2023-10-21: 5 [IU] via SUBCUTANEOUS
  Administered 2023-10-21: 1 [IU] via SUBCUTANEOUS
  Administered 2023-10-22: 3 [IU] via SUBCUTANEOUS
  Filled 2023-10-16 (×15): qty 1

## 2023-10-16 NOTE — Assessment & Plan Note (Addendum)
Mild AKI on admission, likely due to diuretic use this week and subsequent hypovolemia. - S/p half liter bolus in ED - Avoid nephrotoxic agents -- Monitor BMP's

## 2023-10-16 NOTE — Assessment & Plan Note (Addendum)
History of HFrEF with last EF of > 55%.  He has been on Lasix at SNF.  Appears euvolemic to mildly hypovolemic on admission.   - Hold further diuretics - Daily weights & strict IO's

## 2023-10-16 NOTE — ED Notes (Signed)
Hospitalist at bedside 

## 2023-10-16 NOTE — ED Notes (Signed)
X-ray at bedside

## 2023-10-16 NOTE — Consult Note (Signed)
PHARMACY - BRIEF ANTIBIOTIC NOTE   Pharmacy has received consult(s) for cefepime from an ED provider. The patient's profile has been reviewed for ht/wt/allergies/indication/available labs.    One time order(s) placed for: cefepime 2 g  Further antibiotics/pharmacy consults should be ordered by admitting physician if indicated.                       Thank you,  Will M. Dareen Piano, PharmD Clinical Pharmacist 10/16/2023 1:40 PM

## 2023-10-16 NOTE — Assessment & Plan Note (Addendum)
Due to potential underlying pneumonia. - S/p Solu-Medrol 125 mg once - Continue high-dose Solu-Medrol pending further clinical improvement - DuoNebs every 6 hours - Pulmicort twice daily

## 2023-10-16 NOTE — Assessment & Plan Note (Addendum)
Presented in respiratory distress with acute on chronic hypoxic respiratory failure tachycardia, significant leukocytosis, and concern for underlying pneumonia.  Procalcitonin is notably elevated, previously below 0.10.  Pt recently admitted for COPD exacerbation where he received several days of Rocephin.  Due to this, will cover broadly for hospital associated microbes. --Continue IV Levaquin (Transitioned from cefepime/azithromycin to IV levofloxacin given allergic reaction to azithromycin) Respiratory viral panel negative. - Follow blood and sputum cultures pending

## 2023-10-16 NOTE — Assessment & Plan Note (Addendum)
History of chronic hypoxic respiratory failure with hypercapnia on 4 L at baseline.  Acute worsening of both hypoxia and hypercapnia on admission, potentially due to underlying pneumonia.  Initial concern at SNF for hypervolemia, however he appears euvolemic. --Supplement O2 and wean as tolerated --Target spO2 88 -- 95% --Pt did not tolerate BiPAP due to nausea on admission, use as needed if tolerated --Mgmt of COPD and PNA as outlined --Incentive spirometer & flutter

## 2023-10-16 NOTE — ED Provider Notes (Signed)
[facility] Provider Note    Event Date/Time   First MD Initiated Contact with Patient 10/16/23 1147     (approximate)   History   Respiratory Distress   HPI  Christian Fields is a 67 y.o. male presents to the ER for evaluation of worsening shortness of breath.  Reportedly recently diagnosed with pneumonia beginning the week but has not started antibiotics.  Does not wear home oxygen except for 2 L at night.  Reportedly hypoxic on room air.  Did feel some improvement after nebulizer treatment.     Physical Exam   Triage Vital Signs: ED Triage Vitals  Encounter Vitals Group     BP 10/16/23 1139 115/74     Systolic BP Percentile --      Diastolic BP Percentile --      Pulse Rate 10/16/23 1139 (!) 106     Resp 10/16/23 1139 19     Temp 10/16/23 1139 98.3 F (36.8 C)     Temp Source 10/16/23 1139 Oral     SpO2 10/16/23 1139 97 %     Weight 10/16/23 1141 167 lb 8.8 oz (76 kg)     Height 10/16/23 1141 5\' 7"  (1.702 m)     Head Circumference --      Pe[facility] Flow --      Pain Score 10/16/23 1141 2     Pain Loc --      Pain Education --      Exclude from Growth Chart --     Most recent vital signs: Vitals:   10/16/23 1139 10/16/23 1200  BP: 115/74 113/74  Pulse: (!) 106 (!) 112  Resp: 19 (!) 28  Temp: 98.3 F (36.8 C)   SpO2: 97% 98%     Constitutional: Alert  Eyes: Conjunctivae are normal.  Head: Atraumatic. Nose: No congestion/rhinnorhea. Mouth/Throat: Mucous membranes are moist.   Neck: Painless ROM.  Cardiovascular:   Good peripheral circulation. Respiratory: Tachypnea speaking in short phrases respiratory rate 30s.  Prolonged expiratory phase diminished breath sounds throughout. Gastrointestinal: Soft and nontender.  Musculoskeletal:  no deformity Neurologic:  MAE spontaneously. No gross focal neurologic deficits are appreciated.  Skin:  Skin is warm, dry and intact. No rash noted. Psychiatric: Mood and affect are normal. Speech and  behavior are normal.    ED Results / Procedures / Treatments   Labs (all labs ordered are listed, but only abnormal results are displayed) Labs Reviewed  BASIC METABOLIC PANEL - Abnormal; Notable for the following components:      Result Value   Sodium 133 (*)    Chloride 89 (*)    CO2 34 (*)    Glucose, Bld 135 (*)    BUN 25 (*)    Creatinine, Ser 1.44 (*)    Calcium 8.4 (*)    GFR, Estimated 53 (*)    All other components within normal limits  CBC - Abnormal; Notable for the following components:   WBC 35.9 (*)    RBC 3.69 (*)    Hemoglobin 10.2 (*)    HCT 34.2 (*)    MCHC 29.8 (*)    All other components within normal limits  BLOOD GAS, VENOUS - Abnormal; Notable for the following components:   pCO2, Ven 86 (*)    Bicarbonate 41.4 (*)    Acid-Base Excess 11.0 (*)    All other components within normal limits  RESP PANEL BY RT-PCR (RSV, FLU A&B, COVID)  RVPGX2  CULTURE, BLOOD (  ROUTINE X 2)  CULTURE, BLOOD (ROUTINE X 2)  RESPIRATORY PANEL BY PCR  EXPECTORATED SPUTUM ASSESSMENT W GRAM STAIN, RFLX TO RESP C  LACTIC ACID, PLASMA  LACTIC ACID, PLASMA  PROCALCITONIN  BRAIN NATRIURETIC PEPTIDE     EKG  ED ECG REPORT I, Willy Eddy, the attending physician, personally viewed and interpreted this ECG.   Date: 10/16/2023  EKG Time: 12:01  Rate: 112  Rhythm: sinus  Axis: normal  Intervals:normal  ST&T Change: nonspecific st abn    RADIOLOGY Please see ED Course for my review and interpretation.  I personally reviewed all radiographic images ordered to evaluate for the above acute complaints and reviewed radiology reports and findings.  These findings were personally discussed with the patient.  Please see medical record for radiology report.    PROCEDURES:  Critical Care performed: yes  .Critical Care  Performed by: Willy Eddy, MD Authorized by: Willy Eddy, MD   Critical care provider statement:    Critical care time (minutes):  34    Critical care was necessary to treat or prevent imminent or life-threatening deterioration of the following conditions:  Respiratory failure   Critical care was time spent personally by me on the following activities:  Ordering and performing treatments and interventions, ordering and review of laboratory studies, ordering and review of radiographic studies, pulse oximetry, re-evaluation of patient's condition, review of old charts, obtaining history from patient or surrogate, examination of patient, evaluation of patient's response to treatment, discussions with primary provider, discussions with consultants and development of treatment plan with patient or surrogate    MEDICATIONS ORDERED IN ED: Medications  ceFEPIme (MAXIPIME) 2 g in sodium chloride 0.9 % 100 mL IVPB (2 g Intravenous New Bag/Given 10/16/23 1358)  azithromycin (ZITHROMAX) 500 mg in dextrose 5 % 250 mL IVPB (500 mg Intravenous New Bag/Given 10/16/23 1358)  predniSONE (DELTASONE) tablet 40 mg (has no administration in time range)  ipratropium-albuterol (DUONEB) 0.5-2.5 (3) MG/3ML nebulizer solution 3 mL (has no administration in time range)  enoxaparin (LOVENOX) injection 40 mg (has no administration in time range)  sodium chloride flush (NS) 0.9 % injection 3 mL (has no administration in time range)  acetaminophen (TYLENOL) tablet 650 mg (has no administration in time range)    Or  acetaminophen (TYLENOL) suppository 650 mg (has no administration in time range)  ondansetron (ZOFRAN) tablet 4 mg (has no administration in time range)    Or  ondansetron (ZOFRAN) injection 4 mg (has no administration in time range)  diphenhydrAMINE (BENADRYL) capsule 25 mg (has no administration in time range)  ipratropium-albuterol (DUONEB) 0.5-2.5 (3) MG/3ML nebulizer solution 3 mL (3 mLs Nebulization Given 10/16/23 1219)  methylPREDNISolone sodium succinate (SOLU-MEDROL) 125 mg/2 mL injection 125 mg (125 mg Intravenous Given 10/16/23 1244)  sodium  chloride 0.9 % bolus 500 mL (500 mLs Intravenous New Bag/Given 10/16/23 1356)  ondansetron (ZOFRAN) injection 4 mg (4 mg Intravenous Given 10/16/23 1334)     IMPRESSION / MDM / ASSESSMENT AND PLAN / ED COURSE  I reviewed the triage vital signs and the nursing notes.                              Differential diagnosis includes, but is not limited to, Asthma, copd, CHF, pna, ptx, malignancy, Pe, anemia  Patient presenting to the ER for evaluation of symptoms as described above.  Based on symptoms, risk factors and considered above differential, this presenting complaint could reflect  a potentially life-threatening illness therefore the patient will be placed on continuous pulse oximetry and telemetry for monitoring.  Laboratory evaluation will be sent to evaluate for the above complaints.  Given patient's presentation acute respiratory failure with hypoxia and work of breathing placed on BiPAP.    Clinical Course as of 10/16/23 1424  Thu Oct 16, 2023  1218 Patient does appear more comfortable on BiPAP. [PR]  1247 Chest x-ray on my review interpreted patient concerning for pneumonia. [PR]  1318 Broad-spectrum antibiotics have been ordered.  Does have mild hypercapnia though compensated.  Clinically is improving.  Does appear stable and appropriate for admission to the hospital.  I have consulted the hospitalist. [PR]    Clinical Course User Index [PR] Willy Eddy, MD     FINAL CLINICAL IMPRESSION(S) / ED DIAGNOSES   Final diagnoses:  Acute respiratory failure with hypoxia and hypercapnia (HCC)     Rx / DC Orders   ED Discharge Orders     None        Note:  This document was prepared using Dragon voice recognition software and may include unintentional dictation errors.    Willy Eddy, MD 10/16/23 [phone]

## 2023-10-16 NOTE — H&P (Addendum)
History and Physical    Patient: Christian Fields:[id number] DOB: 1956/11/23 DOA: 10/16/2023 DOS: the patient was seen and examined on 10/16/2023 PCP: Alease Medina, MD  Patient coming from: SNF, [facility]  Chief Complaint:  Chief Complaint  Patient presents with   Respiratory Distress   HPI: MAVERIK FOOT is a 67 y.o. male with medical history significant of chronic hypoxic respiratory failure on 4 L, end-stage COPD, hypertension, chronic pain on Suboxone, HFpEF, type 2 diabetes, PAD, hyperlipidemia, gout, who presents to the ED due to shortness of breath.  Mr. Christian Fields states that he was recently discharged from the hospital about 1 week ago to [facility].  On 11/3, he began to develop nausea but did not experience any vomiting.  He also began to experience worsening shortness of breath and an intermittently productive cough with green sputum.  He denies any chest pain or palpitations.  He endorses diarrhea that is nonmelanotic but denies any abdominal pain.  He denies any lower extremity swelling.  Collateral history obtained from patient's son at bedside.  He states that [facility] recently started patient on Lasix due to concern for a congestive heart failure exacerbation.  Then they suspected possible pneumonia and started him on an antibiotic yesterday, he is not sure which antibiotic though.  ED course: On arrival to the ED, patient was normotensive at 113/74 with heart rate of 112.  He was saturating at 98% on 4 L but was placed on BiPAP due to increased work of breathing.  He was afebrile 98.3.Initial workup notable for WBC of 35.9, hemoglobin of 10.2, bicarb of 34, glucose 135, BUN 25, creat 1.44 with GFR 53.  VBG with pH of 7.29 with pCO2 of 86.  Chest x-ray obtained with evidence of no significant change since last x-ray.  Patient started on cefepime, azithromycin.  [name] contacted for admission.  Review of Systems: As mentioned in the history of present illness. All  other systems reviewed and are negative.  Past Medical History:  Diagnosis Date   Anemia    Anxiety    Arthritis    Asthma    Cancer (HCC)    Basal Cell Skin Cancer   Chronic back pain    COPD (chronic obstructive pulmonary disease) (HCC)    Depression    Diabetes mellitus (HCC)    Dyspnea    GERD (gastroesophageal reflux disease)    Gout    Gout    Headache    History of blood clots    Left Leg--July 2018   History of kidney stones    Hyperlipidemia    Hyperlipidemia    Hypertension    Kidney stones    Neuropathy    On home oxygen therapy    2 L / M   Pneumonia 06/2017   Sleep apnea    Ulcer of foot (HCC)    Right   Past Surgical History:  Procedure Laterality Date   APPENDECTOMY     DG FEET 2 VIEWS BILAT     IRRIGATION AND DEBRIDEMENT FOOT Left 07/11/2023   Procedure: IRRIGATION AND DEBRIDEMENT FOOT;  Surgeon: Gwyneth Revels, DPM;  Location: [facility] ORS;  Service: Orthopedics/Podiatry;  Laterality: Left;   LIPOMA EXCISION Right 08/15/2017   Procedure: EXCISION TUMOR(CYST) FOOT;  Surgeon: Recardo Evangelist, DPM;  Location: [facility] ORS;  Service: Podiatry;  Laterality: Right;   LOWER EXTREMITY ANGIOGRAPHY Left 07/03/2023   Procedure: Lower Extremity Angiography;  Surgeon: Annice Needy, MD;  Location: [facility] INVASIVE CV  LAB;  Service: Cardiovascular;  Laterality: Left;   OTHER SURGICAL HISTORY Bilateral Foot surgery   Social History:  reports that he has quit smoking. His smoking use included cigarettes. He started smoking about 6 years ago. He has a 3.4 pack-year smoking history. He has never used smokeless tobacco. He reports current alcohol use of about 1.0 standard drink of alcohol per week. He reports that he does not use drugs.  Allergies  Allergen Reactions   Bee Venom Anaphylaxis   Linezolid Other (See Comments)    Thrombocytopenia, hyperlactatemia   Azithromycin Itching and Rash    Family History  Problem Relation Age of Onset   Hypertension Mother     Prior to  Admission medications   Medication Sig Start Date End Date Taking? Authorizing Provider  acetaminophen (TYLENOL) 325 MG tablet Take 650 mg by mouth every 6 (six) hours as needed.    [provider]  albuterol (VENTOLIN HFA) 108 (90 Base) MCG/ACT inhaler Inhale 2 puffs into the lungs every 4 (four) hours as needed for wheezing or shortness of breath.    [provider]  allopurinol (ZYLOPRIM) 300 MG tablet Take 600 mg by mouth daily. 02/24/20   [provider]  aspirin EC 81 MG tablet Take 1 tablet by mouth daily.    [provider]  azelastine (ASTELIN) 0.1 % nasal spray Place 2 sprays into both nostrils 2 (two) times daily. 05/27/23   [provider]  bisacodyl (DULCOLAX) 5 MG EC tablet Take 1 tablet (5 mg total) by mouth daily as needed for moderate constipation. Patient not taking: Reported on 10/04/2023 09/04/23   Loyce Dys, MD  Buprenorphine HCl-Naloxone HCl 8-2 MG FILM Place 1 Film under the tongue every 8 (eight) hours.    [provider]  clopidogrel (PLAVIX) 75 MG tablet Take 1 tablet (75 mg total) by mouth daily. 02/02/21   Willeen Niece, MD  cyanocobalamin (VITAMIN B12) 1000 MCG/ML injection Inject 1 mL (1,000 mcg total) into the muscle every 30 (thirty) days. 07/12/22   Earna Coder, MD  Dextromethorphan-guaiFENesin 10-100 MG/5ML liquid Take 5 mLs by mouth every 4 (four) hours as needed (cough). 10/01/23   [provider]  DULoxetine (CYMBALTA) 60 MG capsule Take 60 mg by mouth daily. 10/02/23 11/01/23  [provider]  Ensure Max Protein (ENSURE MAX PROTEIN) LIQD Take 330 mLs (11 oz total) by mouth 2 (two) times daily. 10/09/23   Marcelino Duster, MD  escitalopram (LEXAPRO) 5 MG tablet Take 5 mg by mouth daily. 10/01/23 10/31/23  [provider]  ezetimibe (ZETIA) 10 MG tablet Take 10 mg by mouth daily. 02/24/20   [provider]  fluticasone (FLONASE) 50 MCG/ACT nasal spray Place 1  spray into both nostrils daily as needed for allergies. Patient not taking: Reported on 10/04/2023 10/16/18   [provider]  Fluticasone-Umeclidin-Vilant (TRELEGY ELLIPTA) 100-62.5-25 MCG/ACT AEPB Inhale 1 puff into the lungs in the morning. 09/12/21   [provider]  gabapentin (NEURONTIN) 100 MG capsule Take 200 mg by mouth 3 (three) times daily. 10/01/23 10/31/23  [provider]  ipratropium-albuterol (DUONEB) 0.5-2.5 (3) MG/3ML SOLN Take 3 mLs by nebulization every 6 (six) hours as needed (wheezing).    [provider]  levalbuterol Pauline Aus) 0.63 MG/3ML nebulizer solution Take 3 mLs (0.63 mg total) by nebulization every 6 (six) hours as needed for wheezing or shortness of breath. Patient not taking: Reported on 10/04/2023 09/04/23   Loyce Dys, MD  metFORMIN (GLUCOPHAGE)  1000 MG tablet Take 1,000 mg by mouth daily.    [provider]  metoprolol succinate (TOPROL-XL) 50 MG 24 hr tablet Take 50 mg by mouth daily. 02/24/20   [provider]  Multiple Vitamins-Minerals (MULTIVITAMIN ADULT, MINERALS, PO) Take 1 tablet by mouth every morning.    [provider]  ondansetron (ZOFRAN-ODT) 4 MG disintegrating tablet Take 4 mg by mouth every 8 (eight) hours as needed for nausea.    [provider]  OXYGEN Inhale 2 L into the lungs.    [provider]  pantoprazole (PROTONIX) 40 MG tablet Take 40 mg by mouth daily. Patient not taking: Reported on 10/04/2023 06/20/21   [provider]  roflumilast (DALIRESP) 500 MCG TABS tablet Take 500 mcg by mouth daily. 08/15/21   [provider]  simvastatin (ZOCOR) 40 MG tablet Take 40 mg by mouth at bedtime. 01/17/16   [provider]  Syringe/Needle, Disp, (SYRINGE 3CC/25GX1") 25G X 1" 3 ML MISC 1 Syringe by Does not apply route every 30 (thirty) days. 07/12/22   Earna Coder, MD  thiamine (VITAMIN B-1) 100 MG tablet Take 1 tablet (100 mg total) by  mouth daily. 10/10/23   Marcelino Duster, MD  traZODone (DESYREL) 50 MG tablet Take 50 mg by mouth at bedtime. 10/01/23 10/31/23  [provider]    Physical Exam: Vitals:   10/16/23 1139 10/16/23 1141 10/16/23 1200  BP: 115/74  113/74  Pulse: (!) 106  (!) 112  Resp: 19  (!) 28  Temp: 98.3 F (36.8 C)    TempSrc: Oral    SpO2: 97%  98%  Weight:  76 kg   Height:  5\' 7"  (1.702 m)    Physical Exam Vitals and nursing note reviewed.  Constitutional:      General: He is in acute distress.  HENT:     Head: Normocephalic and atraumatic.     Mouth/Throat:     Mouth: Mucous membranes are moist.     Pharynx: Oropharynx is clear.  Eyes:     Conjunctiva/sclera: Conjunctivae normal.     Pupils: Pupils are equal, round, and reactive to light.  Cardiovascular:     Rate and Rhythm: Regular rhythm. Tachycardia present.     Heart sounds: No murmur heard. Pulmonary:     Effort: Tachypnea, accessory muscle usage, prolonged expiration and respiratory distress present.     Breath sounds: Decreased breath sounds (Markedly decreased breath sounds throughout) present. No wheezing, rhonchi or rales.  Abdominal:     General: Bowel sounds are normal. There is no distension.     Palpations: Abdomen is soft.     Tenderness: There is no abdominal tenderness. There is no guarding.  Musculoskeletal:     Right lower leg: No edema.     Left lower leg: No edema.  Skin:    General: Skin is warm and dry.  Neurological:     General: No focal deficit present.     Mental Status: He is alert and oriented to person, place, and time. Mental status is at baseline.  Psychiatric:        Mood and Affect: Mood normal.        Behavior: Behavior normal.    Data Reviewed: CBC with WBC of 35.9, hemoglobin of 10.2, MCV of 92.7 and platelets of 247 BMP with sodium of 133, bicarb 34, glucose 135, BUN 25, creatinine 1.44 with GFR 53 Procalcitonin 2.55 BNP 172.4 Lactic acid 1.2 COVID-19, influenza and  RSV PCR  negative  EKG personally.  Sinus tachycardia with rate of 112.  Repolarization changes noted.  DG Chest Port 1 View  Result Date: 10/16/2023 CLINICAL DATA:  Shortness of breath. EXAM: PORTABLE CHEST 1 VIEW COMPARISON:  Chest radiograph and CT chest dated October 04, 2023. FINDINGS: The heart size and mediastinal contours are unchanged. Aortic atherosclerosis. Similar appearance of bilateral nodular opacities, better evaluated on the prior chest CT dated October 04, 2023. Emphysematous changes. Bibasilar scarring. No pneumothorax or pleural effusion. The visualized skeletal structures are unchanged. IMPRESSION: No significant change from the prior exam. Similar appearance of bilateral nodular opacities, better evaluated on the prior CT chest dated October 04, 2023. Electronically Signed   By: Hart Robinsons M.D.   On: 10/16/2023 14:22    Results are pending, will review when available.  Assessment and Plan:  * Sepsis (HCC) Patient is presenting with respiratory distress with acute on chronic hypoxic respiratory failure tachycardia, significant leukocytosis, and concern for underlying pneumonia.  Procalcitonin is notably elevated, previously below 0.10.  He was recently admitted for COPD exacerbation where he received several days of Rocephin.  Due to this, will cover broadly for hospital associated microbes.  - Telemetry monitoring - Transition cefepime/azithromycin to IV levofloxacin given allergic reaction to azithromycin - Blood cultures pending - Expectorated sputum culture - Full respiratory viral panel  Acute on chronic respiratory failure with hypoxia and hypercapnia (HCC) History of chronic hypoxic respiratory failure with hypercapnia on 4 L at baseline.  Acute worsening of both hypoxia and hypercapnia at this time, potentially due to underlying pneumonia.  Initial concern at SNF for hypervolemia, however he appears euvolemic at this time.  - Continue heated high flow to  maintain oxygen saturation between 88 and 95%.  Unable to tolerate BiPAP at this time due to nausea - Repeat VBG in a few hours  COPD with acute exacerbation (HCC) Due to potential underlying pneumonia.  - S/p Solu-Medrol 125 mg once - Continue high-dose Solu-Medrol till clinical improvement - DuoNebs every 6 hours - Pulmicort twice daily  Acute renal failure superimposed on stage 2 chronic kidney disease (HCC) Mild AKI, likely due to diuretic use this week and subsequent hypovolemia.  - S/p half liter bolus - Repeat BMP in the a.m. - Avoid nephrotoxic agents  Chronic heart failure with preserved ejection fraction (HFpEF) (HCC) Patient has a history of HFrEF with last EF of > 55%.  He has been on Lasix at [facility] and appears euvolemic at this time if not mildly hypovolemic.  BNP is mildly elevated, however compared to previous BNP measurements, it is low for him.  - Hold further diuretics - Daily weights - Strict in and out  Hypertension - Hold home antihypertensives given mild hypotension while hypoxic  NSCLC of left lung (HCC) Previous history of non-small cell lung cancer s/p radiation.  May be contributing overall to patient's extremely poor lung function  Type 2 diabetes mellitus (HCC) - Hold home antiglycemic agents - SSI, moderate  Chronic pain syndrome - Continue home regimen  Advance Care Planning:   Code Status: Full Code   Consults: None  Family Communication: Patient's son updated at bedside  Severity of Illness: The appropriate patient status for this patient is INPATIENT. Inpatient status is judged to be reasonable and necessary in order to provide the required intensity of service to ensure the patient's safety. The patient's presenting symptoms, physical exam findings, and initial radiographic and laboratory data in the context of their chronic comorbidities is felt  to place them at high risk for further clinical deterioration. Furthermore, it is  not anticipated that the patient will be medically stable for discharge from the hospital within 2 midnights of admission.   * I certify that at the point of admission it is my clinical judgment that the patient will require inpatient hospital care spanning beyond 2 midnights from the point of admission due to high intensity of service, high risk for further deterioration and high frequency of surveillance required.*  Author: Verdene Lennert, MD 10/16/2023 3:27 PM  For on call review [email or web address].

## 2023-10-16 NOTE — Assessment & Plan Note (Addendum)
-   Home antihypertensives held on admission given mild hypotension while hypoxic --Resume & titrate regimen as BP allows

## 2023-10-16 NOTE — ED Notes (Signed)
Pt reports nausea. Taken off of Bipap and given Zofran. Provider notified.

## 2023-10-16 NOTE — ED Notes (Signed)
Hospitalist at bedside to check on the BP

## 2023-10-16 NOTE — Assessment & Plan Note (Signed)
-   Hold home antiglycemic agents - SSI, moderate 

## 2023-10-16 NOTE — Assessment & Plan Note (Signed)
-   Continue home regimen 

## 2023-10-16 NOTE — ED Triage Notes (Signed)
Pt from [facility] via [facility]- pt was diagnosed with pneumonia at beginning of week and was supposed to get started on ABX but facility didn't start him until today. Pt had o2 sat of 75% on 3L when ems arrived. Pt reports sob.

## 2023-10-16 NOTE — Assessment & Plan Note (Signed)
Previous history of non-small cell lung cancer s/p radiation.  May be contributing overall to patient's extremely poor lung function

## 2023-10-16 NOTE — ED Notes (Signed)
Pt complaining of itching from the Azithromycin. ED provider notified and Benadryl ordered.

## 2023-10-17 DIAGNOSIS — J9621 Acute and chronic respiratory failure with hypoxia: Secondary | ICD-10-CM | POA: Diagnosis not present

## 2023-10-17 DIAGNOSIS — J441 Chronic obstructive pulmonary disease with (acute) exacerbation: Secondary | ICD-10-CM | POA: Diagnosis not present

## 2023-10-17 DIAGNOSIS — E11621 Type 2 diabetes mellitus with foot ulcer: Secondary | ICD-10-CM | POA: Diagnosis not present

## 2023-10-17 DIAGNOSIS — N179 Acute kidney failure, unspecified: Secondary | ICD-10-CM | POA: Diagnosis not present

## 2023-10-17 LAB — COMPREHENSIVE METABOLIC PANEL
ALT: 12 U/L (ref 0–44)
AST: 12 U/L — ABNORMAL LOW (ref 15–41)
Albumin: 2.7 g/dL — ABNORMAL LOW (ref 3.5–5.0)
Alkaline Phosphatase: 68 U/L (ref 38–126)
Anion gap: 10 (ref 5–15)
BUN: 32 mg/dL — ABNORMAL HIGH (ref 8–23)
CO2: 31 mmol/L (ref 22–32)
Calcium: 8.7 mg/dL — ABNORMAL LOW (ref 8.9–10.3)
Chloride: 98 mmol/L (ref 98–111)
Creatinine, Ser: 1.49 mg/dL — ABNORMAL HIGH (ref 0.61–1.24)
GFR, Estimated: 51 mL/min — ABNORMAL LOW (ref >60)
Glucose, Bld: 202 mg/dL — ABNORMAL HIGH (ref 70–99)
Potassium: 4.9 mmol/L (ref 3.5–5.1)
Sodium: 139 mmol/L (ref 135–145)
Total Bilirubin: 0.3 mg/dL (ref <1.2)
Total Protein: 6.4 g/dL — ABNORMAL LOW (ref 6.5–8.1)

## 2023-10-17 LAB — CBC WITH DIFFERENTIAL/PLATELET
Abs Immature Granulocytes: 0.11 10*3/uL — ABNORMAL HIGH (ref 0.00–0.07)
Basophils Absolute: 0 10*3/uL (ref 0.0–0.1)
Basophils Relative: 0 %
Eosinophils Absolute: 0 10*3/uL (ref 0.0–0.5)
Eosinophils Relative: 0 %
HCT: 29.2 % — ABNORMAL LOW (ref 39.0–52.0)
Hemoglobin: 8.6 g/dL — ABNORMAL LOW (ref 13.0–17.0)
Immature Granulocytes: 1 %
Lymphocytes Relative: 3 %
Lymphs Abs: 0.5 10*3/uL — ABNORMAL LOW (ref 0.7–4.0)
MCH: 28.1 pg (ref 26.0–34.0)
MCHC: 29.5 g/dL — ABNORMAL LOW (ref 30.0–36.0)
MCV: 95.4 fL (ref 80.0–100.0)
Monocytes Absolute: 0.1 10*3/uL (ref 0.1–1.0)
Monocytes Relative: 1 %
Neutro Abs: 15.5 10*3/uL — ABNORMAL HIGH (ref 1.7–7.7)
Neutrophils Relative %: 95 %
Platelets: 186 10*3/uL (ref 150–400)
RBC: 3.06 MIL/uL — ABNORMAL LOW (ref 4.22–5.81)
RDW: 15.3 % (ref 11.5–15.5)
WBC: 16.2 10*3/uL — ABNORMAL HIGH (ref 4.0–10.5)
nRBC: 0 % (ref 0.0–0.2)

## 2023-10-17 LAB — CBG MONITORING, ED
Glucose-Capillary: 193 mg/dL — ABNORMAL HIGH (ref 70–99)
Glucose-Capillary: 180 mg/dL — ABNORMAL HIGH (ref 70–99)
Glucose-Capillary: 281 mg/dL — ABNORMAL HIGH (ref 70–99)
Glucose-Capillary: 180 mg/dL — ABNORMAL HIGH (ref 70–99)

## 2023-10-17 LAB — HEMOGLOBIN A1C
Hgb A1c MFr Bld: 5.7 % — ABNORMAL HIGH (ref 4.8–5.6)
Mean Plasma Glucose: 116.89 mg/dL

## 2023-10-17 NOTE — ED Notes (Signed)
Pt wanted more time to eat. RT called and pt will be hooked back to bipap for nighttime

## 2023-10-17 NOTE — ED Notes (Signed)
Pt back on bipap and tolerating well.

## 2023-10-17 NOTE — ED Notes (Signed)
PT asked for a dinner tray did not eat the meal from cafe, provided

## 2023-10-17 NOTE — ED Notes (Signed)
Pt assisted to bedside toilet to have a BM, pt refused to use a bed pan.

## 2023-10-17 NOTE — ED Notes (Signed)
Pt eating and drinking- let him know that RT coming to put him back on BIpap soon. He is in agreement

## 2023-10-17 NOTE — ED Notes (Signed)
NP made aware of pt's BPs. Encouraging pt to drink orally.

## 2023-10-17 NOTE — ED Notes (Signed)
Assisted pt to and from toilet. Pt back in bed, breakfast tray given to patient and call bell within reach.

## 2023-10-17 NOTE — ED Notes (Signed)
MD made aware of pt not having a diet order placed

## 2023-10-17 NOTE — Progress Notes (Signed)
Progress Note   Patient: Christian Fields WUJ:[id number] DOB: 12-02-1956 DOA: 10/16/2023     1 DOS: the patient was seen and examined on 10/17/2023   Brief hospital course: HPI on admission 10/16/23:  "Christian Fields is a 67 y.o. male with medical history significant of chronic hypoxic respiratory failure on 4 L, end-stage COPD, hypertension, chronic pain on Suboxone, HFpEF, type 2 diabetes, PAD, hyperlipidemia, gout, who presents to the ED due to shortness of breath.   Mr. Knicely states that he was recently discharged from the hospital about 1 week ago to [facility].  On 11/3, he began to develop nausea but did not experience any vomiting.  He also began to experience worsening shortness of breath and an intermittently productive cough with green sputum....." See H&P for full HPI on admission and ED course.  Pt was admitted to the hospital for further evaluation and management of acute on chronic respiratory failure with hypoxia and hypercapnia due to suspected pneumonia and COPD with acute exacerbation.  He was initially placed on BiPAP due to respiratory distress with signficant work of breathing.    11/8 -- pt on 45 L/min heated HF O2 this AM, weaned to 15 L/min by later this AM.   Assessment and Plan: * Sepsis (HCC) Presented in respiratory distress with acute on chronic hypoxic respiratory failure tachycardia, significant leukocytosis, and concern for underlying pneumonia.  Procalcitonin is notably elevated, previously below 0.10.  Pt recently admitted for COPD exacerbation where he received several days of Rocephin.  Due to this, will cover broadly for hospital associated microbes. --Continue IV Levaquin (Transitioned from cefepime/azithromycin to IV levofloxacin given allergic reaction to azithromycin) Respiratory viral panel negative. - Follow blood and sputum cultures pending  Acute on chronic respiratory failure with hypoxia and hypercapnia (HCC) History of chronic hypoxic  respiratory failure with hypercapnia on 4 L at baseline.  Acute worsening of both hypoxia and hypercapnia on admission, potentially due to underlying pneumonia.  Initial concern at SNF for hypervolemia, however he appears euvolemic. --Supplement O2 and wean as tolerated --Target spO2 88 -- 95% --Pt did not tolerate BiPAP due to nausea on admission, use as needed if tolerated --Mgmt of COPD and PNA as outlined --Incentive spirometer & flutter  COPD with acute exacerbation (HCC) Due to potential underlying pneumonia. - S/p Solu-Medrol 125 mg once - Continue high-dose Solu-Medrol pending further clinical improvement - DuoNebs every 6 hours - Pulmicort twice daily  Acute renal failure superimposed on stage 2 chronic kidney disease (HCC) Mild AKI on admission, likely due to diuretic use this week and subsequent hypovolemia. - S/p half liter bolus in ED - Avoid nephrotoxic agents -- Monitor BMP's  Chronic heart failure with preserved ejection fraction (HFpEF) (HCC) History of HFrEF with last EF of > 55%.  He has been on Lasix at SNF.  Appears euvolemic to mildly hypovolemic on admission.   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  May be contributing overall to patient's extremely poor lung function  Type 2 diabetes mellitus (HCC) - Hold home antiglycemic agents - SSI, moderate  Chronic pain syndrome - Continue home regimen        Subjective: Pt seen in ED, holding for a bed this AM. He reports breathing is somewhat improved today.  Has ongoing cough and dyspnea worse than baseline. No fever/chills. No other acute complaints.   Physical Exam: Vitals:   10/17/23 0752 10/17/23 0800 10/17/23 1218 10/17/23 1230  BP:  118/68  119/61  Pulse: 97 96   (!) 104  Resp:  16  20  Temp: 98 F (36.7 C)   98 F (36.7 C)  TempSrc: Oral   Oral  SpO2: 100% 100% 97% 100%  Weight:      Height:       General exam: awake, alert, no acute distress, chronically ill appearing HEENT: moist mucus membranes, hearing grossly normal  Respiratory system: severely diminished breath sounds, diffuse expiratory wheezes, increased respiratory effort at rest with accessory muscle use. On 15 L/min HFNC O2 Cardiovascular system: normal S1/S2, RRR, no JVD, murmurs, rubs, gallops, no pedal edema.   Gastrointestinal system: soft, NT, ND, no HSM felt, +bowel sounds. Central nervous system: A&O x3. no gross focal neurologic deficits, normal speech Extremities: moves all, no edema, normal tone Skin: dry, intact, normal temperature Psychiatry: normal mood, congruent affect, judgement and insight appear normal   Data Reviewed:  Notable labs ---  Glucose 202 BUN 32 Cr 1.44 >> 1.49 Ca 8.8 Albumin 2.7 AST 12  WBC 35.9 >> 16.2 Hbg 10.2 >> 8.6  Hbg A1c 5.7%  Family Communication: None present   Disposition: Status is: Inpatient Remains inpatient appropriate because: severity of illness still requiring far more Oxygen than at baseline, remains on IV therapies   Planned Discharge Destination: Skilled nursing facility    Time spent: 42 minutes  Author: Pennie Banter, DO 10/17/2023 1:41 PM  For on call review [email or web address].

## 2023-10-17 NOTE — ED Notes (Signed)
Pt refusing to keep cardiac monitor on and BP cuff, pt allows this RN to obtain vitals when asked.

## 2023-10-17 NOTE — ED Notes (Signed)
Patient is resting comfortably. 

## 2023-10-17 NOTE — ED Notes (Signed)
Pt arguing with this RN about the color of his gabapentin and "this isn't right". Pt educated we may not have the same color pill that he is used to taking but it is the same does. Pt also stating he wants his gabapentin first, pt educated I would put putting his pills in his cup and he can take them in whatever order he would like.   Pt also requests to have his hi-flow O2 taken off to eat, pt educated that is not possible at this time due to him being on 40L/min. Tubing placed behind pt and pt able to eat at this time.

## 2023-10-18 DIAGNOSIS — J441 Chronic obstructive pulmonary disease with (acute) exacerbation: Secondary | ICD-10-CM | POA: Diagnosis not present

## 2023-10-18 DIAGNOSIS — R251 Tremor, unspecified: Secondary | ICD-10-CM | POA: Clinically undetermined

## 2023-10-18 DIAGNOSIS — I5032 Chronic diastolic (congestive) heart failure: Secondary | ICD-10-CM | POA: Diagnosis not present

## 2023-10-18 DIAGNOSIS — J9621 Acute and chronic respiratory failure with hypoxia: Secondary | ICD-10-CM | POA: Diagnosis not present

## 2023-10-18 LAB — GLUCOSE, CAPILLARY
Glucose-Capillary: 123 mg/dL — ABNORMAL HIGH (ref 70–99)
Glucose-Capillary: 132 mg/dL — ABNORMAL HIGH (ref 70–99)
Glucose-Capillary: 211 mg/dL — ABNORMAL HIGH (ref 70–99)
Glucose-Capillary: 198 mg/dL — ABNORMAL HIGH (ref 70–99)

## 2023-10-18 LAB — CBC
HCT: 26.7 % — ABNORMAL LOW (ref 39.0–52.0)
Hemoglobin: 7.9 g/dL — ABNORMAL LOW (ref 13.0–17.0)
MCH: 27.8 pg (ref 26.0–34.0)
MCHC: 29.6 g/dL — ABNORMAL LOW (ref 30.0–36.0)
MCV: 94 fL (ref 80.0–100.0)
Platelets: 208 10*3/uL (ref 150–400)
RBC: 2.84 MIL/uL — ABNORMAL LOW (ref 4.22–5.81)
RDW: 15.1 % (ref 11.5–15.5)
WBC: 17 10*3/uL — ABNORMAL HIGH (ref 4.0–10.5)
nRBC: 0 % (ref 0.0–0.2)

## 2023-10-18 LAB — MAGNESIUM: Magnesium: 1.6 mg/dL — ABNORMAL LOW (ref 1.7–2.4)

## 2023-10-18 LAB — BASIC METABOLIC PANEL
Anion gap: 8 (ref 5–15)
BUN: 32 mg/dL — ABNORMAL HIGH (ref 8–23)
CO2: 35 mmol/L — ABNORMAL HIGH (ref 22–32)
Calcium: 8.1 mg/dL — ABNORMAL LOW (ref 8.9–10.3)
Chloride: 96 mmol/L — ABNORMAL LOW (ref 98–111)
Creatinine, Ser: 1.36 mg/dL — ABNORMAL HIGH (ref 0.61–1.24)
GFR, Estimated: 57 mL/min — ABNORMAL LOW (ref >60)
Glucose, Bld: 197 mg/dL — ABNORMAL HIGH (ref 70–99)
Potassium: 4.3 mmol/L (ref 3.5–5.1)
Sodium: 139 mmol/L (ref 135–145)

## 2023-10-18 LAB — MRSA NEXT GEN BY PCR, NASAL: MRSA by PCR Next Gen: DETECTED — AB

## 2023-10-18 MED ORDER — DIPHENHYDRAMINE HCL 50 MG/ML IJ SOLN
12.5000 mg | Freq: Four times a day (QID) | INTRAMUSCULAR | Status: DC | PRN
  Administered 2023-10-18: 12.5 mg via INTRAVENOUS
  Filled 2023-10-18: qty 1

## 2023-10-18 MED ORDER — VANCOMYCIN HCL 1250 MG/250ML IV SOLN: 1250.0000 mg | INTRAVENOUS | Status: DC

## 2023-10-18 MED ORDER — ORAL CARE MOUTH RINSE: 15.0000 mL | OROMUCOSAL | Status: DC | PRN

## 2023-10-18 MED ORDER — MAGNESIUM SULFATE 2 GM/50ML IV SOLN
2.0000 g | Freq: Once | INTRAVENOUS | Status: AC
  Administered 2023-10-18: 2 g via INTRAVENOUS
  Filled 2023-10-18: qty 50

## 2023-10-18 MED ORDER — VANCOMYCIN HCL 1750 MG/350ML IV SOLN
1750.0000 mg | Freq: Once | INTRAVENOUS | Status: AC
  Administered 2023-10-18: 1750 mg via INTRAVENOUS
  Filled 2023-10-18: qty 350

## 2023-10-18 MED ORDER — IPRATROPIUM-ALBUTEROL 0.5-2.5 (3) MG/3ML IN SOLN
3.0000 mL | Freq: Two times a day (BID) | RESPIRATORY_TRACT | Status: DC
  Administered 2023-10-18 – 2023-10-22 (×8): 3 mL via RESPIRATORY_TRACT
  Filled 2023-10-18 (×8): qty 3

## 2023-10-18 MED ORDER — GABAPENTIN 100 MG PO CAPS
200.0000 mg | ORAL_CAPSULE | Freq: Three times a day (TID) | ORAL | Status: DC
  Administered 2023-10-18 – 2023-10-20 (×7): 200 mg via ORAL
  Filled 2023-10-18 (×7): qty 2

## 2023-10-18 MED ORDER — CHLORHEXIDINE GLUCONATE CLOTH 2 % EX PADS
6.0000 | MEDICATED_PAD | Freq: Every day | CUTANEOUS | Status: DC
  Administered 2023-10-19 – 2023-10-22 (×3): 6 via TOPICAL

## 2023-10-18 MED ORDER — BUPRENORPHINE HCL-NALOXONE HCL 8-2 MG SL SUBL
1.0000 | SUBLINGUAL_TABLET | Freq: Three times a day (TID) | SUBLINGUAL | Status: DC
  Administered 2023-10-18 – 2023-10-22 (×14): 1 via SUBLINGUAL
  Filled 2023-10-18 (×15): qty 1

## 2023-10-18 MED ORDER — MUPIROCIN 2 % EX OINT
1.0000 | TOPICAL_OINTMENT | Freq: Two times a day (BID) | CUTANEOUS | Status: DC
  Administered 2023-10-19 – 2023-10-22 (×8): 1 via NASAL
  Filled 2023-10-18: qty 22

## 2023-10-18 NOTE — Progress Notes (Signed)
Patient was ordered for IV Vancomycin, 16:20 - Vancomycin was started, 16:50 - Patient complained of itching, stopped the medicine,  MD and Pharmacy made aware of. Vanncomycin was discontinued and IV Benadryl was ordered.

## 2023-10-18 NOTE — Evaluation (Signed)
Physical Therapy Evaluation Patient Details Name: Christian Fields MRN: [id number] DOB: 11-03-1956 Today's Date: 10/18/2023  History of Present Illness  Pt is a 67 y.o. male presenting to hospital 10/16/23 with c/o worsening SOB; recent discharge from hospital about 1 week ago to SNF.  Pt admitted with sepsis, acute on chronic respiratory failure with hypoxia and hypercapnia, COPD with acute exacerbation, acute renal failure.  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 PMH includes anemia, anxiety, asthma, CA, chronic back pain, COPD on home O2, DM, gout, HA, h/o blood clots, h/o kidney stones, neuropathy, PNA, sleep apnea, foot ulcer, chronic pain syndrome, s/p I&D L foot 07/11/23.  Clinical Impression  Prior to hospital admission, pt reports being at rehab (working on walking with walker; NWB'ing L LE); lives with his son and son's girlfriend in 1 level home with 8 STE B railings.  Currently pt is SBA with bed mobility and demonstrating normal sitting balance.  During assessment, pt became SOB quickly with any activity requiring pacing and rest breaks (limiting activity); O2 sats 89% or greater on 6 L O2 via nasal cannula with activities during session.  Nursing reports pt desaturates going to [facility] (requiring increase to 15L HFNC).  Pt would currently benefit from skilled PT to address noted impairments and functional limitations (see below for any additional details).  Upon hospital discharge, pt would benefit from ongoing therapy.     If plan is discharge home, recommend the following: A lot of help with walking and/or transfers;A little help with bathing/dressing/bathroom;Assistance with cooking/housework;Direct supervision/assist for medications management;Help with stairs or ramp for entrance;Assist for transportation   Can travel by private vehicle   No    Equipment Recommendations Other (comment) (TBD at next facility)   Recommendations for Other Services  OT consult    Functional Status Assessment Patient has had a recent decline in their functional status and demonstrates the ability to make significant improvements in function in a reasonable and predictable amount of time.     Precautions / Restrictions Precautions Precautions: Fall Precaution Comments: Monitor O2 Restrictions Weight Bearing Restrictions: Yes LLE Weight Bearing: Non weight bearing      Mobility  Bed Mobility Overal bed mobility: Needs Assistance Bed Mobility: Supine to Sit, Sit to Supine     Supine to sit: Supervision Sit to supine: Supervision   General bed mobility comments: mild increased effort to perform on own    Transfers                   General transfer comment: Deferred d/t SOB with minimal activity    Ambulation/Gait                  Stairs            Wheelchair Mobility     Tilt Bed    Modified Rankin (Stroke Patients Only)       Balance Overall balance assessment: Needs assistance Sitting-balance support: No upper extremity supported, Feet supported Sitting balance-Leahy Scale: Normal Sitting balance - Comments: steady reaching outside BOS                                     Pertinent Vitals/Pain Pain Assessment Pain Assessment: Faces Faces Pain Scale: Hurts a little bit Pain Location: B LE's d/t neuropathy Pain Descriptors / Indicators: Discomfort Pain Intervention(s): Limited activity within patient's tolerance, Monitored during session,  Repositioned (RN present and aware (RN giving medications))    Home Living Family/patient expects to be discharged to:: Private residence Living Arrangements: Children (Son and girlfriend) Available Help at Discharge: Family;Available PRN/intermittently Type of Home: Mobile home Home Access: Stairs to enter Entrance Stairs-Rails: Right;Left;Can reach both Entrance Stairs-Number of Steps: 8   Home Layout:  One level Home Equipment: Agricultural consultant (2 wheels);Cane - single point;Rollator (4 wheels)      Prior Function Prior Level of Function : Needs assist       Physical Assist : Mobility (physical);ADLs (physical)   ADLs (physical): Bathing Mobility Comments: Pt reports he was in rehab prior to admission; working on walking with walker (NWB L LE) with assist.  At baseline ambulates without AD use.       Extremity/Trunk Assessment   Upper Extremity Assessment Upper Extremity Assessment: Overall WFL for tasks assessed    Lower Extremity Assessment Lower Extremity Assessment: Generalized weakness    Cervical / Trunk Assessment Cervical / Trunk Assessment: Normal  Communication   Communication Communication: No apparent difficulties Cueing Techniques: Verbal cues  Cognition Arousal: Alert Behavior During Therapy: WFL for tasks assessed/performed Overall Cognitive Status: Within Functional Limits for tasks assessed                                          General Comments  Nursing cleared pt for participation in physical therapy.  Pt agreeable to PT session.    Exercises     Assessment/Plan    PT Assessment Patient needs continued PT services  PT Problem List Cardiopulmonary status limiting activity;Decreased activity tolerance;Decreased balance;Decreased mobility;Decreased safety awareness;Decreased knowledge of use of DME;Decreased strength;Pain       PT Treatment Interventions DME instruction;Balance training;Gait training;Stair training;Functional mobility training;Therapeutic activities;Therapeutic exercise;Patient/family education;Wheelchair mobility training    PT Goals (Current goals can be found in the Care Plan section)  Acute Rehab PT Goals Patient Stated Goal: to go back to rehab and get better PT Goal Formulation: With patient Time For Goal Achievement: 11/01/23 Potential to Achieve Goals: Good    Frequency Min 1X/week      Co-evaluation               AM-PAC PT "6 Clicks" Mobility  Outcome Measure Help needed turning from your back to your side while in a flat bed without using bedrails?: None Help needed moving from lying on your back to sitting on the side of a flat bed without using bedrails?: A Little Help needed moving to and from a bed to a chair (including a wheelchair)?: A Little Help needed standing up from a chair using your arms (e.g., wheelchair or bedside chair)?: A Little Help needed to walk in hospital room?: A Lot Help needed climbing 3-5 steps with a railing? : Total 6 Click Score: 16    End of Session Equipment Utilized During Treatment: Oxygen Activity Tolerance: Other (comment) (limited d/t SOB with minimal activity) Patient left: in bed;with call bell/phone within reach;with bed alarm set;with nursing/sitter in room Nurse Communication: Mobility status;Precautions (pt's SOB with minimal activity) PT Visit Diagnosis: Other abnormalities of gait and mobility (R26.89);Muscle weakness (generalized) (M62.81);Pain Pain - Right/Left:  (B) Pain - part of body: Leg    Time: 1610-9604 PT Time Calculation (min) (ACUTE ONLY): 26 min   Charges:   PT Evaluation $PT Eval Low Complexity: 1 Low   PT General Charges $$  ACUTE PT VISIT: 1 Visit        Hendricks Limes, PT 10/18/23, 10:46 AM

## 2023-10-18 NOTE — Consult Note (Signed)
Pharmacy Antibiotic Note  Christian Fields is a 67 y.o. male admitted on 10/16/2023 with pneumonia.  Presented with respiratory failure and concerns for underlying pneumonia. Sputum cultures with few staph aureus. Provider wanted to start vancomycin while susceptibilities are pending. Pharmacy has been consulted for pneumonia dosing.  Today, 10/18/2023 Day 1 vancomycin; Day 2 of antibiotics total 11/7 CT Chest: Dependent opacity in the right lower lobe may represent atelectasis or pneumonia Scr 1.36 elevated from baseline ~ 1.0 - 1.15 Afebrile last 24 hours 11/9 Sputum cultures: Staphylococcus aureus  Plan: Give vancomycin 1750 mg IV x1. Goal AUC 400-550. Then start 1250 mg IV  Q24H Expected AUC: 510.6 Expected Css min: 12.7 SCr used: 1.36  Weight used: IBW, Vd used: 0.72 (BMI 26.24) Patient is also on Zosyn 3.375 mg IV Q8H Patient is also on Doxycycline 100 mg Q12H  Continue to monitor renal function and follow culture results  Height: 5\' 7"  (170.2 cm) Weight: 76 kg (167 lb 8.8 oz) IBW/kg (Calculated) : 66.1  Temp (24hrs), Avg:98.2 F (36.8 C), Min:97.8 F (36.6 C), Max:98.5 F (36.9 C)  Recent Labs  Lab 10/16/23 1216 10/16/23 1331 10/16/23 1502 10/17/23 0451 10/18/23 0452  WBC 35.9*  --   --  16.2* 17.0*  CREATININE 1.44*  --   --  1.49* 1.36*  LATICACIDVEN  --  1.2 1.0  --   --     Estimated Creatinine Clearance: 48.6 mL/min (A) (by C-G formula based on SCr of 1.36 mg/dL (H)).    Allergies  Allergen Reactions   Bee Venom Anaphylaxis   Linezolid Other (See Comments)    Thrombocytopenia, hyperlactatemia   Azithromycin Itching and Rash    Antimicrobials this admission: Zosyn 11/7 >>  Doxycycline 11/7 >>  Cefepime 11/7 x1  Dose adjustments this admission: NA  Microbiology results: 11/7 BCx: NGTD 11/7 Sputum: few Staphylococcus aureus  11/9 MRSA PCR: needs to be collected  Thank you for allowing pharmacy to be a part of this patient's care.  Effie Shy, PharmD Pharmacy Resident  10/18/2023 3:38 PM

## 2023-10-18 NOTE — Assessment & Plan Note (Signed)
Suspect from gabapentin.  On recent d/c summary, pt d/c on 200 mg TID.  Med history this admission shows 400 mg TID, and is ordered at this dose.   11/9 - pt having severe tremors in both hands, not present yesterday, hospital day 1 Suspect from higher dose gabapentin. --Reduce gabapentin to 200 mg TID --Monitor for improvement --Appears pt given Klonopin last admission for this, but was not continued on d/c.  Would avoid benzo's if possible.

## 2023-10-18 NOTE — Progress Notes (Signed)
Progress Note   Patient: Christian Fields [id number]:[id number] DOB: February 09, 1956 DOA: 10/16/2023     2 DOS: the patient was seen and examined on 10/18/2023   Brief hospital course: HPI on admission 10/16/23:  "Christian Fields is a 67 y.o. male with medical history significant of chronic hypoxic respiratory failure on 4 L, end-stage COPD, hypertension, chronic pain on Suboxone, HFpEF, type 2 diabetes, PAD, hyperlipidemia, gout, who presents to the ED due to shortness of breath.   Christian Fields states that he was recently discharged from the hospital about 1 week ago to [facility].  On 11/3, he began to develop nausea but did not experience any vomiting.  He also began to experience worsening shortness of breath and an intermittently productive cough with green sputum....." See H&P for full HPI on admission and ED course.  Pt was admitted to the hospital for further evaluation and management of acute on chronic respiratory failure with hypoxia and hypercapnia due to suspected pneumonia and COPD with acute exacerbation.  He was initially placed on BiPAP due to respiratory distress with signficant work of breathing.    11/8 -- pt on 45 L/min heated HF O2 this AM, weaned to 15 L/min by later this AM.  11/9 -- pt weaned to 4-6 L/min HFNC today.  Tremors in both hands, reduce gabapentin dose.  PT/OT orders to return to SNF at dc.    Assessment and Plan: * Sepsis (HCC) Presented in respiratory distress with acute on chronic hypoxic respiratory failure tachycardia, significant leukocytosis, and concern for underlying pneumonia.  Procalcitonin is notably elevated, previously below 0.10.  Pt recently admitted for COPD exacerbation where he received several days of Rocephin.  Due to this, will cover broadly for hospital associated microbes. --Continue IV Levaquin (Transitioned from cefepime/azithromycin to IV levofloxacin given allergic reaction to azithromycin) Respiratory viral panel negative. - Follow blood and  sputum cultures pending  Acute on chronic respiratory failure with hypoxia and hypercapnia (HCC) History of chronic hypoxic respiratory failure with hypercapnia on 4 L at baseline.  Acute worsening of both hypoxia and hypercapnia on admission, potentially due to underlying pneumonia.  Initial concern at SNF for hypervolemia, however he appears euvolemic. --Supplement O2 and wean as tolerated --Target spO2 88 -- 95% --Pt did not tolerate BiPAP due to nausea on admission, use as needed if tolerated --Mgmt of COPD and PNA as outlined --Incentive spirometer & flutter  COPD with acute exacerbation (HCC) Due to potential underlying pneumonia. - S/p Solu-Medrol 125 mg once - Continue high-dose Solu-Medrol pending further clinical improvement - DuoNebs every 6 hours - Pulmicort twice daily  Acute renal failure superimposed on stage 2 chronic kidney disease (HCC) Mild AKI on admission, likely due to diuretic use this week and subsequent hypovolemia. - S/p half liter bolus in ED - Avoid nephrotoxic agents -- Monitor BMP's  Chronic heart failure with preserved ejection fraction (HFpEF) (HCC) History of HFrEF with last EF of > 55%.  He has been on Lasix at SNF.  Appears euvolemic to mildly hypovolemic on admission.   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  May be contributing overall to patient's extremely poor lung function  Type 2 diabetes mellitus (HCC) - Hold home antiglycemic agents - SSI, moderate  Chronic pain syndrome - Continue  home regimen  Tremor of both hands Suspect from gabapentin.  On recent d/c summary, pt d/c on 200 mg TID.  Med history this admission shows 400 mg TID, and is ordered at this dose.   11/9 - pt having severe tremors in  both hands, not present yesterday, hospital day 1 Suspect from higher dose gabapentin. --Reduce gabapentin to 200 mg TID --Monitor for improvement --Appears pt given Klonopin last admission for this, but was not continued on d/c.  Would avoid benzo's if possible.  Hypomagnesemia 2g IV Mg-sulfate ordered for Mg 1.6 today. Monitor & replace Mg PRN        Subjective: Pt awake sitting up in bed this AM. He reports bilateral hands shaking and asks for whatever medication he was given here last admission, can't recall the name.  Reports breathing feels a little better today, but not yet at baseline.    Physical Exam: Vitals:   10/17/23 2225 10/18/23 0506 10/18/23 0700 10/18/23 1215  BP: 127/68 (!) 109/59 (!) 98/54 136/76  Pulse: (!) 108 98    Resp: (!) 22 20 18    Temp: 98.2 F (36.8 C) 98.5 F (36.9 C) 98.1 F (36.7 C) 98.4 F (36.9 C)  TempSrc: Oral Oral Oral Oral  SpO2: 97% 98% 90%   Weight:      Height:       General exam: awake, alert, no acute distress, chronically ill appearing HEENT: moist mucus membranes, hearing grossly normal  Respiratory system: mildly improved aeration today,  ongoing expiratory wheezes, normal respiratory effort at rest with accessory muscle use. On 6 L/min HFNC O2 Cardiovascular system: normal S1/S2, RRR, no pedal edema.   Gastrointestinal system: soft, NT, ND Central nervous system: A&O x3. no gross focal neurologic deficits, normal speech Extremities: moves all, no edema, normal tone Skin: dry, intact, normal temperature Psychiatry: normal mood, congruent affect, judgement and insight appear normal   Data Reviewed:  Notable labs ---  Glucose 197 BICARB 35 BUN 32 Cr 1.44 >> 1.49 >> 1.36 Ca 8.1 Mg 1.6  WBC 35.9 >> 16.2 >> 17 Hbg 10.2 >> 8.6 >> 7.9  Hbg A1c 5.7%  Family Communication: None present   Disposition: Status is: Inpatient Remains inpatient appropriate because: severity of illness still more Oxygen than at baseline,  remains on IV therapies   Planned Discharge Destination: Skilled nursing facility    Time spent: 38 minutes  Author: Pennie Banter, DO 10/18/2023 1:30 PM  For on call review [email or web address].

## 2023-10-18 NOTE — Progress Notes (Signed)
OT Cancellation Note  Patient Details Name: Christian Fields MRN: [id number] DOB: 11-Nov-1956   Cancelled Treatment:    Reason Eval/Treat Not Completed: Fatigue/lethargy limiting ability to participate. Discussed with PT - pt just finished PT eval, and was SOB upon exertion and desaturating. Will re-attempt OT eval as able to allow for best functional outcomes.    Lindon Kiel L. Lexus Shampine, OTR/L  10/18/23, 3:50 PM

## 2023-10-18 NOTE — Assessment & Plan Note (Signed)
2g IV Mg-sulfate ordered for Mg 1.6 today. Monitor & replace Mg PRN

## 2023-10-18 NOTE — Progress Notes (Signed)
1:25pm Patient called requesting his oxygen be turned up.  O2 on 6 liters and O2 sat 80%.  Oxygen increased to 7 liters and O2 sat increased to  80%.  Patient requested it be turned up again.  Oxygen turned up to 8 liters and O2 sat at 97%.  Eating and feels better.

## 2023-10-18 NOTE — Progress Notes (Signed)
MRSA + Result received from Lab.  Oon-call notified.

## 2023-10-18 NOTE — Plan of Care (Signed)
  Problem: Education: Goal: Knowledge of disease or condition will improve Outcome: Progressing Goal: Knowledge of the prescribed therapeutic regimen will improve Outcome: Progressing Goal: Individualized Educational Video(s) Outcome: Progressing   Problem: Activity: Goal: Ability to tolerate increased activity will improve Outcome: Progressing Goal: Will verbalize the importance of balancing activity with adequate rest periods Outcome: Progressing   Problem: Respiratory: Goal: Ability to maintain a clear airway will improve Outcome: Progressing Goal: Levels of oxygenation will improve Outcome: Progressing Goal: Ability to maintain adequate ventilation will improve Outcome: Progressing   Problem: Activity: Goal: Ability to tolerate increased activity will improve Outcome: Progressing   Problem: Clinical Measurements: Goal: Ability to maintain a body temperature in the normal range will improve Outcome: Progressing   Problem: Respiratory: Goal: Ability to maintain adequate ventilation will improve Outcome: Progressing Goal: Ability to maintain a clear airway will improve Outcome: Progressing   Problem: Education: Goal: Ability to describe self-care measures that may prevent or decrease complications (Diabetes Survival Skills Education) will improve Outcome: Progressing Goal: Individualized Educational Video(s) Outcome: Progressing   Problem: Coping: Goal: Ability to adjust to condition or change in health will improve Outcome: Progressing   Problem: Fluid Volume: Goal: Ability to maintain a balanced intake and output will improve Outcome: Progressing   Problem: Health Behavior/Discharge Planning: Goal: Ability to identify and utilize available resources and services will improve Outcome: Progressing Goal: Ability to manage health-related needs will improve Outcome: Progressing   Problem: Metabolic: Goal: Ability to maintain appropriate glucose levels will  improve Outcome: Progressing   Problem: Nutritional: Goal: Maintenance of adequate nutrition will improve Outcome: Progressing Goal: Progress toward achieving an optimal weight will improve Outcome: Progressing   Problem: Skin Integrity: Goal: Risk for impaired skin integrity will decrease Outcome: Progressing   Problem: Tissue Perfusion: Goal: Adequacy of tissue perfusion will improve Outcome: Progressing   Problem: Education: Goal: Knowledge of General Education information will improve Description: Including pain rating scale, medication(s)/side effects and non-pharmacologic comfort measures Outcome: Progressing   Problem: Health Behavior/Discharge Planning: Goal: Ability to manage health-related needs will improve Outcome: Progressing   Problem: Clinical Measurements: Goal: Ability to maintain clinical measurements within normal limits will improve Outcome: Progressing Goal: Will remain free from infection Outcome: Progressing Goal: Diagnostic test results will improve Outcome: Progressing Goal: Respiratory complications will improve Outcome: Progressing Goal: Cardiovascular complication will be avoided Outcome: Progressing   Problem: Activity: Goal: Risk for activity intolerance will decrease Outcome: Progressing   Problem: Nutrition: Goal: Adequate nutrition will be maintained Outcome: Progressing   Problem: Coping: Goal: Level of anxiety will decrease Outcome: Progressing   Problem: Elimination: Goal: Will not experience complications related to bowel motility Outcome: Progressing Goal: Will not experience complications related to urinary retention Outcome: Progressing   Problem: Pain Management: Goal: General experience of comfort will improve Outcome: Progressing   Problem: Safety: Goal: Ability to remain free from injury will improve Outcome: Progressing   Problem: Skin Integrity: Goal: Risk for impaired skin integrity will decrease Outcome:  Progressing

## 2023-10-19 ENCOUNTER — Encounter: Payer: Self-pay | Admitting: Internal Medicine

## 2023-10-19 DIAGNOSIS — R000 Tachycardia, unspecified: Secondary | ICD-10-CM

## 2023-10-19 DIAGNOSIS — J9602 Acute respiratory failure with hypercapnia: Secondary | ICD-10-CM | POA: Diagnosis not present

## 2023-10-19 DIAGNOSIS — I5032 Chronic diastolic (congestive) heart failure: Secondary | ICD-10-CM

## 2023-10-19 DIAGNOSIS — J9601 Acute respiratory failure with hypoxia: Secondary | ICD-10-CM | POA: Diagnosis not present

## 2023-10-19 LAB — CBC
HCT: 29.2 % — ABNORMAL LOW (ref 39.0–52.0)
Hemoglobin: 8.9 g/dL — ABNORMAL LOW (ref 13.0–17.0)
MCH: 27.4 pg (ref 26.0–34.0)
MCHC: 30.5 g/dL (ref 30.0–36.0)
MCV: 89.8 fL (ref 80.0–100.0)
Platelets: 242 10*3/uL (ref 150–400)
RBC: 3.25 MIL/uL — ABNORMAL LOW (ref 4.22–5.81)
RDW: 15.4 % (ref 11.5–15.5)
WBC: 18.2 10*3/uL — ABNORMAL HIGH (ref 4.0–10.5)
nRBC: 0 % (ref 0.0–0.2)

## 2023-10-19 LAB — BLOOD GAS, VENOUS
Acid-Base Excess: 13.5 mmol/L — ABNORMAL HIGH (ref 0.0–2.0)
Bicarbonate: 41.2 mmol/L — ABNORMAL HIGH (ref 20.0–28.0)
O2 Saturation: 89.9 %
Patient temperature: 37
pCO2, Ven: 65 mm[Hg] — ABNORMAL HIGH (ref 44–60)
pH, Ven: 7.41 (ref 7.25–7.43)
pO2, Ven: 57 mm[Hg] — ABNORMAL HIGH (ref 32–45)

## 2023-10-19 LAB — GLUCOSE, CAPILLARY
Glucose-Capillary: 106 mg/dL — ABNORMAL HIGH (ref 70–99)
Glucose-Capillary: 122 mg/dL — ABNORMAL HIGH (ref 70–99)
Glucose-Capillary: 127 mg/dL — ABNORMAL HIGH (ref 70–99)
Glucose-Capillary: 146 mg/dL — ABNORMAL HIGH (ref 70–99)

## 2023-10-19 LAB — BASIC METABOLIC PANEL
Anion gap: 9 (ref 5–15)
BUN: 32 mg/dL — ABNORMAL HIGH (ref 8–23)
CO2: 32 mmol/L (ref 22–32)
Calcium: 8.7 mg/dL — ABNORMAL LOW (ref 8.9–10.3)
Chloride: 96 mmol/L — ABNORMAL LOW (ref 98–111)
Creatinine, Ser: 1.28 mg/dL — ABNORMAL HIGH (ref 0.61–1.24)
GFR, Estimated: >60 mL/min (ref >60)
Glucose, Bld: 149 mg/dL — ABNORMAL HIGH (ref 70–99)
Potassium: 3.9 mmol/L (ref 3.5–5.1)
Sodium: 137 mmol/L (ref 135–145)

## 2023-10-19 LAB — CULTURE, RESPIRATORY W GRAM STAIN

## 2023-10-19 LAB — MAGNESIUM: Magnesium: 1.8 mg/dL (ref 1.7–2.4)

## 2023-10-19 MED ORDER — ALPRAZOLAM 0.25 MG PO TABS
0.2500 mg | ORAL_TABLET | Freq: Once | ORAL | Status: AC
  Administered 2023-10-19: 0.25 mg via ORAL
  Filled 2023-10-19: qty 1

## 2023-10-19 MED ORDER — LORAZEPAM 0.5 MG PO TABS
0.5000 mg | ORAL_TABLET | Freq: Every evening | ORAL | Status: DC | PRN
  Administered 2023-10-19 – 2023-10-21 (×3): 0.5 mg via ORAL
  Filled 2023-10-19 (×3): qty 1

## 2023-10-19 MED ORDER — SODIUM CHLORIDE 0.9 % IV SOLN
2.0000 g | INTRAVENOUS | Status: DC
  Administered 2023-10-20 – 2023-10-22 (×3): 2 g via INTRAVENOUS
  Filled 2023-10-19 (×3): qty 20

## 2023-10-19 MED ORDER — ESCITALOPRAM OXALATE 10 MG PO TABS
10.0000 mg | ORAL_TABLET | Freq: Every day | ORAL | Status: DC
  Administered 2023-10-19 – 2023-10-22 (×4): 10 mg via ORAL
  Filled 2023-10-19 (×4): qty 1

## 2023-10-19 MED ORDER — DOXYCYCLINE HYCLATE 100 MG PO TABS
100.0000 mg | ORAL_TABLET | Freq: Two times a day (BID) | ORAL | Status: DC
  Administered 2023-10-19 – 2023-10-22 (×6): 100 mg via ORAL
  Filled 2023-10-19 (×6): qty 1

## 2023-10-19 NOTE — TOC Initial Note (Signed)
Transition of Care [facility]) - Initial/Assessment Note    Patient Details  Name: Christian Fields MRN: [id number] Date of Birth: June 07, 1956  [facility]) CM/SW Contact:    Verna Czech [place], [place] Phone Number: 10/19/2023, 12:17 PM  Clinical Narrative:                 CSW met with patient at bedside to discuss recommendation for skilled care. Per patient, he as recently discharged from the hospital to [facility] for rehab. Patient resides in his own home, son and son's girlfriend reside with him. Patient states that he has a walker and shower chair in th home. He was recently assessed for a wheelchair. Patient also has oxygen through [employer]. Patient agreeable to returning to [facility] following this hospitalization. Fl2 initiated  Transition of Care to continue to follow for discharge needs.  Expected Discharge Plan: Skilled Nursing Facility Barriers to Discharge: Continued Medical Work up   Patient Goals and CMS Choice Patient states their goals for this hospitalization and ongoing recovery are:: "To get back to [facility]" CMS Medicare.gov Compare Post Acute Care list provided to:: Patient Choice offered to / list presented to : Patient      Expected Discharge Plan and Services In-house Referral: Clinical Social Work     Living arrangements for the past 2 months: Mobile Home                                      Prior Living Arrangements/Services Living arrangements for the past 2 months: Mobile Home Lives with:: Adult Children Patient language and need for interpreter reviewed:: No Do you feel safe going back to the place where you live?: Yes      Need for Family Participation in Patient Care: Yes (Comment) Care giver support system in place?: Yes (comment) Current home services: DME (oxygen) Criminal Activity/Legal Involvement Pertinent to Current Situation/Hospitalization: No - Comment as needed  Activities of Daily Living   ADL  Screening (condition at time of admission) Independently performs ADLs?: No Does the patient have a NEW difficulty with bathing/dressing/toileting/self-feeding that is expected to last >3 days?: No Does the patient have a NEW difficulty with getting in/out of bed, walking, or climbing stairs that is expected to last >3 days?: No Does the patient have a NEW difficulty with communication that is expected to last >3 days?: No Is the patient deaf or have difficulty hearing?: No Does the patient have difficulty seeing, even when wearing glasses/contacts?: No Does the patient have difficulty concentrating, remembering, or making decisions?: No  Permission Sought/Granted   Permission granted to share information with : Yes, Verbal Permission Granted     Permission granted to share info w AGENCY: [facility]  Permission granted to share info w Relationship: daughter  Permission granted to share info w Contact Information: [phone]  Emotional Assessment Appearance:: Appears stated age Attitude/Demeanor/Rapport: Engaged, Self-Confident Affect (typically observed): Accepting Orientation: : Oriented to Self, Oriented to Place, Oriented to  Time, Oriented to Situation Alcohol / Substance Use: Not Applicable Psych Involvement: No (comment)  Admission diagnosis:  Acute respiratory failure with hypoxia and hypercapnia (HCC) [J96.01, J96.02] Acute on chronic respiratory failure with hypoxia and hypercapnia (HCC) [I34.74, J96.22] Patient Active Problem List   Diagnosis Date Noted   Hypomagnesemia 10/18/2023   Tremor of both hands 10/18/2023   Acute hypoxic respiratory failure (HCC) 10/06/2023   Respiratory distress  10/04/2023   Anemia 10/04/2023   Multifocal pneumonia 08/30/2023   Acute osteomyelitis of left calcaneus (HCC) 07/11/2023   Diabetic infection of left foot (HCC) 07/11/2023   History of osteomyelitis 07/10/2023   Cellulitis and abscess of toe of left foot 07/03/2023   PVD  (peripheral vascular disease) (HCC) 07/03/2023   Cellulitis of left lower extremity 07/02/2023   Diabetic foot ulcer (HCC) 07/02/2023   HLD (hyperlipidemia) 07/02/2023   Depression with anxiety 07/02/2023   Elevated lactic acid level 07/02/2023   Abnormal LFTs 07/02/2023   COPD (chronic obstructive pulmonary disease) (HCC) 07/02/2023   Diarrhea 07/02/2023   History of ankle fusion (Right) 12/31/2021   Chronic anticoagulation (Plavix) 12/31/2021   Long term prescription benzodiazepine use (Klonopin) 12/31/2021   DDD (degenerative disc disease), cervical 12/31/2021   Pharmacologic therapy 10/31/2021   Disorder of skeletal system 10/31/2021   Problems influencing health status 10/31/2021   Aspiration pneumonia of right lower lobe due to gastric secretions (HCC)    Sepsis (HCC) 08/05/2021   Gout 08/05/2021   Depression 08/05/2021   Chronic heart failure with preserved ejection fraction (HFpEF) (HCC) 08/05/2021   HCAP (healthcare-associated pneumonia) 08/05/2021   Pain of left lower extremity 03/25/2021   Acute on chronic respiratory failure with hypoxia (HCC) 03/08/2021   Septic shock (HCC) 02/26/2021   Polysubstance (excluding opioids) dependence (HCC) 01/30/2021   Acute metabolic encephalopathy 01/29/2021   COPD with acute exacerbation (HCC) 01/29/2021   Iron deficiency anemia 11/27/2020   Severe sepsis (HCC)    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   Left leg swelling 04/28/2020   Confusion    Acute respiratory failure with hypoxia (HCC) 03/26/2020   Pneumonia of both lower lobes due to infectious organism 03/25/2020   Acute renal failure superimposed on stage 2 chronic kidney disease (HCC) 01/13/2020   Cellulitis of left leg 01/12/2020   COVID-19 virus detected 01/12/2020   Diabetic ulcer of left midfoot associated with type 2 diabetes mellitus, with fat layer exposed (HCC) 01/12/2020   Panlobular emphysema (HCC) 01/12/2020   Arthritis 10/29/2019   Hemorrhoids 10/29/2019    Senile nuclear sclerosis, bilateral 02/15/2019   Cellulitis of left upper limb 02/11/2019   AAA (abdominal aortic aneurysm) without rupture 01/15/2019   Localized swelling, mass and lump, upper limb 12/24/2018   Pain in femur 12/24/2018   TIA (transient ischemic attack) 11/18/2018   Cortical age-related cataract of both eyes 11/02/2018   Fracture of multiple ribs 04/20/2018   Overweight (BMI 25.0-29.9) 04/20/2018   Hypertension 07/08/2017   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/08/2017   Hyperlipidemia 07/08/2017   Tobacco use disorder 07/08/2017   Atherosclerosis of native arteries of extremity with intermittent claudication (HCC) 07/08/2017   SOB (shortness of breath) 06/19/2017   Acute on chronic respiratory failure with hypoxia and hypercapnia (HCC) 12/15/2016   CAP (community acquired pneumonia) 12/15/2016   Chronic pain syndrome 12/15/2016   Chronically on opiate therapy 12/15/2016   History of kidney stones 12/15/2016   Polypharmacy 12/15/2016   Somnolence 12/15/2016   Foot ulcer (HCC) 01/19/2016   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 [place] [address] Phone: [phone] Fax: [phone]  [facility] [address] [place] [address] Phone: [phone] Fax: [phone]     Social Determinants of Health (SDOH) Social History: SDOH Screenings    Food Insecurity: No Food Insecurity (10/19/2023)  Housing: Low Risk  (10/19/2023)  Transportation Needs: No Transportation Needs (10/19/2023)  Utilities: Not At Risk (10/19/2023)  Financial Resource Strain: Low Risk  (04/19/2023)   [facility] from [facility]  Tobacco Use: Medium Risk (10/19/2023)   SDOH Interventions:     Readmission Risk Interventions    10/09/2023   11:34 AM 10/07/2023    1:48 PM 09/03/2023    2:13 PM  Readmission Risk Prevention Plan  Transportation Screening  Complete Complete  Medication Review (RN Care Manager)  Complete Complete  PCP or Specialist appointment within 3-5 days of discharge   Complete  HRI or Home Care Consult  Complete Complete  SW Recovery Care/Counseling Consult Complete  Complete  Palliative Care Screening  Not Applicable Not Applicable  Skilled Nursing Facility  Complete Not Applicable

## 2023-10-19 NOTE — [place] FL2 (Signed)
[place] MEDICAID FL2 LEVEL OF CARE FORM     IDENTIFICATION  Patient Name: Christian Fields Birthdate: [birth year] Sex: male Admission Date (Current Location): 10/16/2023  [place] and [place] Number:  Chiropodist and Address:  [facility], [address] [place], [place] [address]      Provider Number: [id number]  Attending Physician Name and Address:  Leeroy Bock, MD  Relative Name and Phone Number:  [name] [phone]    Current Level of Care: Hospital Recommended Level of Care: Skilled Nursing Facility Prior Approval Number:    Date Approved/Denied:   PASRR Number: [id number]  Discharge Plan: SNF    Current Diagnoses: Patient Active Problem List   Diagnosis Date Noted   Hypomagnesemia 10/18/2023   Tremor of both hands 10/18/2023   Acute hypoxic respiratory failure (HCC) 10/06/2023   Respiratory distress 10/04/2023   Anemia 10/04/2023   Multifocal pneumonia 08/30/2023   Acute osteomyelitis of left calcaneus (HCC) 07/11/2023   Diabetic infection of left foot (HCC) 07/11/2023   History of osteomyelitis 07/10/2023   Cellulitis and abscess of toe of left foot 07/03/2023   PVD (peripheral vascular disease) (HCC) 07/03/2023   Cellulitis of left lower extremity 07/02/2023   Diabetic foot ulcer (HCC) 07/02/2023   HLD (hyperlipidemia) 07/02/2023   Depression with anxiety 07/02/2023   Elevated lactic acid level 07/02/2023   Abnormal LFTs 07/02/2023   COPD (chronic obstructive pulmonary disease) (HCC) 07/02/2023   Diarrhea 07/02/2023   History of ankle fusion (Right) 12/31/2021   Chronic anticoagulation (Plavix) 12/31/2021   Long term prescription benzodiazepine use (Klonopin) 12/31/2021   DDD (degenerative disc disease), cervical 12/31/2021   Pharmacologic therapy 10/31/2021   Disorder of skeletal system 10/31/2021   Problems influencing health status 10/31/2021   Aspiration pneumonia of right lower lobe due to  gastric secretions (HCC)    Sepsis (HCC) 08/05/2021   Gout 08/05/2021   Depression 08/05/2021   Chronic heart failure with preserved ejection fraction (HFpEF) (HCC) 08/05/2021   HCAP (healthcare-associated pneumonia) 08/05/2021   Pain of left lower extremity 03/25/2021   Acute on chronic respiratory failure with hypoxia (HCC) 03/08/2021   Septic shock (HCC) 02/26/2021   Polysubstance (excluding opioids) dependence (HCC) 01/30/2021   Acute metabolic encephalopathy 01/29/2021   COPD with acute exacerbation (HCC) 01/29/2021   Iron deficiency anemia 11/27/2020   Severe sepsis (HCC)    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   Left leg swelling 04/28/2020   Confusion    Acute respiratory failure with hypoxia (HCC) 03/26/2020   Pneumonia of both lower lobes due to infectious organism 03/25/2020   Acute renal failure superimposed on stage 2 chronic kidney disease (HCC) 01/13/2020   Cellulitis of left leg 01/12/2020   COVID-19 virus detected 01/12/2020   Diabetic ulcer of left midfoot associated with type 2 diabetes mellitus, with fat layer exposed (HCC) 01/12/2020   Panlobular emphysema (HCC) 01/12/2020   Arthritis 10/29/2019   Hemorrhoids 10/29/2019   Senile nuclear sclerosis, bilateral 02/15/2019   Cellulitis of left upper limb 02/11/2019   AAA (abdominal aortic aneurysm) without rupture 01/15/2019   Localized swelling, mass and lump, upper limb 12/24/2018   Pain in femur 12/24/2018   TIA (transient ischemic attack) 11/18/2018   Cortical age-related cataract of both eyes 11/02/2018   Fracture of multiple ribs 04/20/2018   Overweight (BMI 25.0-29.9) 04/20/2018   Hypertension 07/08/2017   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/08/2017   Hyperlipidemia 07/08/2017   Tobacco use disorder 07/08/2017  Atherosclerosis of native arteries of extremity with intermittent claudication (HCC) 07/08/2017   SOB (shortness of breath) 06/19/2017   Acute on chronic respiratory failure with  hypoxia and hypercapnia (HCC) 12/15/2016   CAP (community acquired pneumonia) 12/15/2016   Chronic pain syndrome 12/15/2016   Chronically on opiate therapy 12/15/2016   History of kidney stones 12/15/2016   Polypharmacy 12/15/2016   Somnolence 12/15/2016   Foot ulcer (HCC) 01/19/2016   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) 11/29/2015   Other psychoactive substance use, unspecified with withdrawal, uncomplicated (HCC) 11/29/2015   Type 2 diabetes mellitus (HCC) 11/27/2015   Chronic obstructive pulmonary disease, unspecified (HCC) 01/18/2013   Depressive disorder 01/18/2013   Hereditary and idiopathic peripheral neuropathy 01/18/2013   Sleep apnea 01/18/2013   Low back pain 11/23/2010   Acute gouty arthropathy 07/10/2010   Problem related to lifestyle 10/29/2004    Orientation RESPIRATION BLADDER Height & Weight     Self, Time, Situation, Place  O2 Continent Weight: 167 lb 8.8 oz (76 kg) Height:  5\' 7"  (170.2 cm)  BEHAVIORAL SYMPTOMS/MOOD NEUROLOGICAL BOWEL NUTRITION STATUS      Incontinent Diet  AMBULATORY STATUS COMMUNICATION OF NEEDS Skin   Limited Assist Verbally Skin abrasions, Bruising                       Personal Care Assistance Level of Assistance  Bathing, Feeding, Dressing Bathing Assistance: Limited assistance Feeding assistance: Independent Dressing Assistance: Limited assistance     Functional Limitations Info  Sight, Hearing, Speech Sight Info: Adequate Hearing Info: Adequate Speech Info: Adequate    SPECIAL CARE FACTORS FREQUENCY  PT (By licensed PT), OT (By licensed OT)     PT Frequency: 5x per week OT Frequency: 5x per week            Contractures Contractures Info: Not present    Additional Factors Info  Code Status, Allergies Code Status Info: [place] Allergies Info: Bee Venom, Linezolid, Azithromycin           Current Medications (10/19/2023):  This is the current hospital active medication list Current  Facility-Administered Medications  Medication Dose Route Frequency Provider Last Rate Last Admin   acetaminophen (TYLENOL) tablet 650 mg  650 mg Oral Q6H PRN Verdene Lennert, MD   650 mg at 10/18/23 1529   Or   acetaminophen (TYLENOL) suppository 650 mg  650 mg Rectal Q6H PRN Verdene Lennert, MD       allopurinol (ZYLOPRIM) tablet 600 mg  600 mg Oral Daily Verdene Lennert, MD   600 mg at 10/19/23 4010   aspirin EC tablet 81 mg  81 mg Oral Daily Verdene Lennert, MD   81 mg at 10/19/23 0953   budesonide (PULMICORT) nebulizer solution 0.5 mg  0.5 mg Nebulization BID Verdene Lennert, MD   0.5 mg at 10/19/23 0752   buprenorphine-naloxone (SUBOXONE) 8-2 mg per SL tablet 1 tablet  1 tablet Sublingual Q8H Manuela Schwartz, NP   1 tablet at 10/19/23 0631   Chlorhexidine Gluconate Cloth 2 % PADS 6 each  6 each Topical Q0600 Esaw Grandchild A, DO   6 each at 10/19/23 0541   clopidogrel (PLAVIX) tablet 75 mg  75 mg Oral Daily Verdene Lennert, MD   75 mg at 10/19/23 0953   doxycycline (VIBRAMYCIN) 100 mg in dextrose 5 % 250 mL IVPB  100 mg Intravenous Q12H Verdene Lennert, MD 125 mL/hr at 10/19/23 0347 100 mg at 10/19/23 0347   DULoxetine (CYMBALTA) DR capsule 60 mg  60 mg Oral Daily Verdene Lennert, MD   60 mg at 10/19/23 1001   enoxaparin (LOVENOX) injection 40 mg  40 mg Subcutaneous Q24H Verdene Lennert, MD   40 mg at 10/18/23 1355   escitalopram (LEXAPRO) tablet 10 mg  10 mg Oral Daily Leeroy Bock, MD   10 mg at 10/19/23 0953   ezetimibe (ZETIA) tablet 10 mg  10 mg Oral Daily Verdene Lennert, MD   10 mg at 10/19/23 0953   gabapentin (NEURONTIN) capsule 200 mg  200 mg Oral TID Esaw Grandchild A, DO   200 mg at 10/19/23 0953   insulin aspart (novoLOG) injection 0-15 Units  0-15 Units Subcutaneous TID WC Verdene Lennert, MD   2 Units at 10/19/23 1157   ipratropium-albuterol (DUONEB) 0.5-2.5 (3) MG/3ML nebulizer solution 3 mL  3 mL Nebulization BID Esaw Grandchild A, DO   3 mL at 10/19/23 0752    LORazepam (ATIVAN) tablet 0.5 mg  0.5 mg Oral QHS PRN Leeroy Bock, MD       methylPREDNISolone sodium succinate (SOLU-MEDROL) 40 mg/mL injection 40 mg  40 mg Intravenous Daily Verdene Lennert, MD   40 mg at 10/19/23 0954   mupirocin ointment (BACTROBAN) 2 % 1 Application  1 Application Nasal BID Esaw Grandchild A, DO   1 Application at 10/19/23 1000   ondansetron (ZOFRAN) tablet 4 mg  4 mg Oral Q6H PRN Verdene Lennert, MD       Or   ondansetron (ZOFRAN) injection 4 mg  4 mg Intravenous Q6H PRN Verdene Lennert, MD       Oral care mouth rinse  15 mL Mouth Rinse PRN Manuela Schwartz, NP       pantoprazole (PROTONIX) EC tablet 40 mg  40 mg Oral Daily Verdene Lennert, MD   40 mg at 10/19/23 0953   piperacillin-tazobactam (ZOSYN) IVPB 3.375 g  3.375 g Intravenous Q8H Verdene Lennert, MD 12.5 mL/hr at 10/19/23 0638 3.375 g at 10/19/23 6578   roflumilast (DALIRESP) tablet 500 mcg  500 mcg Oral Daily Verdene Lennert, MD   500 mcg at 10/19/23 0953   simvastatin (ZOCOR) tablet 40 mg  40 mg Oral QHS Verdene Lennert, MD   40 mg at 10/18/23 2156   sodium chloride flush (NS) 0.9 % injection 3 mL  3 mL Intravenous Q12H Verdene Lennert, MD   3 mL at 10/19/23 0954   thiamine (VITAMIN B1) tablet 100 mg  100 mg Oral Daily Verdene Lennert, MD   100 mg at 10/19/23 0954   traZODone (DESYREL) tablet 50 mg  50 mg Oral QHS PRN Verdene Lennert, MD   50 mg at 10/18/23 2156     Discharge Medications: Please see discharge summary for a list of discharge medications.  Relevant Imaging Results:  Relevant Lab Results:   Additional Information ss [id number]  Verna Czech [place], [place]

## 2023-10-19 NOTE — Progress Notes (Signed)
Progress Note   Patient: Christian Fields UJW:[id number] DOB: May 14, 1956 DOA: 10/16/2023     3 DOS: the patient was seen and examined on 10/19/2023   Brief hospital course: Christian Fields is a 67 y.o. male with medical history significant of chronic hypoxic respiratory failure on 4 L, end-stage COPD, hypertension, chronic pain on Suboxone, HFpEF, type 2 diabetes, PAD, hyperlipidemia, gout, who presents to the ED due to shortness of breath.  Pt was admitted to the hospital for further evaluation and management of acute on chronic respiratory failure with hypoxia and hypercapnia due to suspected pneumonia and COPD with acute exacerbation.  He was initially placed on BiPAP due to respiratory distress with signficant work of breathing and weaned back to baseline over several days.  Today, he is weaned to his baseline of 4L [place] and doing well. He will be able to dc back to SNF tomorrow.    Assessment and Plan: Sepsis (HCC) Acute on chronic respiratory failure with hypoxia and hypercapnia (HCC) CAP COPD with acute exacerbation (HCC) History of chronic hypoxic respiratory failure with hypercapnia on 4 L at baseline.  Acute worsening of both hypoxia and hypercapnia on admission, potentially due to underlying pneumonia.  Initial concern at SNF for hypervolemia, however he appears euvolemic. --Supplement O2 and wean as tolerated --Continue IV Levaquin (Transitioned from cefepime/azithromycin to IV levofloxacin given allergic reaction to azithromycin) --Target spO2 88 -- 95% --Mgmt of COPD and PNA as outlined --Incentive spirometer & flutter - S/p Solu-Medrol 125 mg once - DuoNebs every 6 hours - Pulmicort twice daily  Acute renal failure superimposed on stage 2 chronic kidney disease (HCC) Mild AKI on admission, likely due to diuretic use this week and subsequent hypovolemia. - S/p half liter bolus in ED - Avoid nephrotoxic agents -- Monitor BMP's  Chronic HFpEF- History of HFrEF with last EF of  > 55%.  He has been on Lasix at SNF.  Appears euvolemic to mildly hypovolemic on admission.   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  May be contributing overall to patient's extremely poor lung function  Type 2 diabetes mellitus (HCC) - Hold home antiglycemic agents - SSI, moderate  Chronic pain syndrome - Continue home regimen  Tremor of both hands- improved Suspect from gabapentin.  On recent d/c summary, pt d/c on 200 mg TID.  Med history this admission shows 400 mg TID, and is ordered at this dose.   11/9 - pt having severe tremors in both hands, not present yesterday, hospital day 1 Suspect from higher dose gabapentin. --Reduce gabapentin to 200 mg TID --Monitor for improvement  Hypomagnesemia 2g IV Mg-sulfate ordered for Mg 1.6 today. Monitor & replace Mg PRN  Anxiety- chronic, poorly controlled. Exacerbated in hospital. Describes insomnia - increase lexapro - at bedtime ativan  Subjective: Pt reports getting a bad night sleep. Had "pain attacks" all night and has poorly controlled anxiety. Denies respiratory distress. OK with returning to SNF  Physical Exam: Vitals:   10/19/23 0035 10/19/23 0416 10/19/23 0633 10/19/23 0738  BP: 131/61 (!) 133/57  (!) 151/101  Pulse:    (!) 119  Resp:  (!) 21  (!) 22  Temp: 98.8 F (37.1 C) 98.6 F (37 C) 98.5 F (36.9 C) 98.6 F (37 C)  TempSrc:  Oral Oral Oral Oral  SpO2:    92%  Weight:      Height:       General exam: awake, alert, no acute distress, chronically ill appearing HEENT: moist mucus membranes, hearing grossly normal  Respiratory system: mildly improved aeration today,  ongoing expiratory wheezes, normal respiratory effort at rest with accessory muscle use. On 6 L/min HFNC  O2 Cardiovascular system: normal S1/S2, RRR, no pedal edema.   Central nervous system: A&O x3. no gross focal neurologic deficits, normal speech Extremities: moves all, no edema, normal tone Skin: dry, intact, normal temperature Psychiatry: normal mood, congruent affect, judgement and insight appear normal  Data Reviewed:    Latest Ref Rng & Units 10/19/2023    5:49 AM 10/18/2023    4:52 AM 10/17/2023    4:51 AM  CBC  WBC 4.0 - 10.5 K/uL 18.2  17.0  16.2   Hemoglobin 13.0 - 17.0 g/dL 8.9  7.9  8.6   Hematocrit 39.0 - 52.0 % 29.2  26.7  29.2   Platelets 150 - 400 K/uL 242  208  186       Latest Ref Rng & Units 10/19/2023    5:49 AM 10/18/2023    4:52 AM 10/17/2023    4:51 AM  BMP  Glucose 70 - 99 mg/dL 696  295  284   BUN 8 - 23 mg/dL 32  32  32   Creatinine 0.61 - 1.24 mg/dL 1.32  4.40  1.02   Sodium 135 - 145 mmol/L 137  139  139   Potassium 3.5 - 5.1 mmol/L 3.9  4.3  4.9   Chloride 98 - 111 mmol/L 96  96  98   CO2 22 - 32 mmol/L 32  35  31   Calcium 8.9 - 10.3 mg/dL 8.7  8.1  8.7    Hbg V2Z 5.7%  Family Communication: None present   Disposition: Status is: Inpatient Remains inpatient appropriate because: awaiting SNF placement   Planned Discharge Destination: Skilled nursing facility    Time spent: 38 minutes  Author: Leeroy Bock, MD 10/19/2023 8:02 AM  For on call review [email or web address].

## 2023-10-19 NOTE — TOC Progression Note (Addendum)
Transition of Care [facility]) - Progression Note    Patient Details  Name: Christian Fields MRN: [id number] Date of Birth: 01-Nov-1956  Transition of Care [facility]) CM/SW Contact  Roizy Harold [place], [place] Phone Number: 10/19/2023, 3:25 PM  Clinical Narrative:    Phone call to [facility] to confirm that patient can return. Spoke with Tammy who confirmed that patient can return on 10/20/23, will need new insurance authorization. Authorization pending-clinicals faxed to [id number] reference number [id number].  4:47pm Notified by RN that patient would like to leave AMA. Informed patient's daughter that if  leaves AMA he may jeopardize his bed offer at [facility]. Patient's daughter will emphasize this to patient .    Shoichi Mielke, LCSW Transition of Care   Expected Discharge Plan: Skilled Nursing Facility Barriers to Discharge: Continued Medical Work up  Expected Discharge Plan and Services In-house Referral: Clinical Social Work     Living arrangements for the past 2 months: Mobile Home                                       Social Determinants of Health (SDOH) Interventions SDOH Screenings   Food Insecurity: No Food Insecurity (10/19/2023)  Housing: Low Risk  (10/19/2023)  Transportation Needs: No Transportation Needs (10/19/2023)  Utilities: Not At Risk (10/19/2023)  Financial Resource Strain: Low Risk  (04/19/2023)   Received from [facility], [facility]  Tobacco Use: Medium Risk (10/19/2023)    Readmission Risk Interventions    10/09/2023   11:34 AM 10/07/2023    1:48 PM 09/03/2023    2:13 PM  Readmission Risk Prevention Plan  Transportation Screening  Complete Complete  Medication Review Oceanographer)  Complete Complete  PCP or Specialist appointment within 3-5 days of discharge   Complete  HRI or Home Care Consult  Complete Complete  SW Recovery Care/Counseling Consult Complete  Complete  Palliative Care Screening  Not Applicable Not  Applicable  Skilled Nursing Facility  Complete Not Applicable

## 2023-10-19 NOTE — Progress Notes (Signed)
PHARMACIST - PHYSICIAN COMMUNICATION  CONCERNING: Antibiotic IV to Oral Route Change Policy  RECOMMENDATION: This patient is receiving doxycycline by the intravenous route.  Based on criteria approved by the Pharmacy and Therapeutics Committee, the antibiotic(s) is/are being converted to the equivalent oral dose form(s).  DESCRIPTION: These criteria include: Patient being treated for a respiratory tract infection, urinary tract infection, cellulitis or clostridium difficile associated diarrhea if on metronidazole The patient is not neutropenic and does not exhibit a GI malabsorption state The patient is eating (either orally or via tube) and/or has been taking other orally administered medications for a least 24 hours The patient is improving clinically and has a Tmax < 100.5  If you have questions about this conversion, please contact the Pharmacy Department   Tressie Ellis 10/19/23

## 2023-10-19 NOTE — Progress Notes (Addendum)
OT Cancellation Note  Patient Details Name: Christian Fields MRN: [id number] DOB: 1956/03/01   Cancelled Treatment:    Reason Eval/Treat Not Completed: Other (comment). Pt received in bed asleep, easily awakens but tells this Clinical research associate he is having a lot of anxiety and requesting medication. RN notified. OT will return as able.  Addendum: Attempted to complete OT eval, pt received upright in recliner. HR noted to be 120 at rest with pt appearing fatigued and lethargic. Will re-attempt.  Barre Aydelott L. Cheria Sadiq, OTR/L  10/19/23, 8:51 AM

## 2023-10-20 DIAGNOSIS — R000 Tachycardia, unspecified: Secondary | ICD-10-CM

## 2023-10-20 DIAGNOSIS — J9601 Acute respiratory failure with hypoxia: Secondary | ICD-10-CM | POA: Diagnosis not present

## 2023-10-20 DIAGNOSIS — J9602 Acute respiratory failure with hypercapnia: Secondary | ICD-10-CM | POA: Diagnosis not present

## 2023-10-20 DIAGNOSIS — F419 Anxiety disorder, unspecified: Secondary | ICD-10-CM

## 2023-10-20 LAB — BLOOD GAS, VENOUS
Acid-Base Excess: 11 mmol/L — ABNORMAL HIGH (ref 0.0–2.0)
Bicarbonate: 41.4 mmol/L — ABNORMAL HIGH (ref 20.0–28.0)
Patient temperature: 37
pCO2, Ven: 86 mm[Hg] (ref 44–60)
pH, Ven: 7.29 (ref 7.25–7.43)

## 2023-10-20 LAB — GLUCOSE, CAPILLARY
Glucose-Capillary: 76 mg/dL (ref 70–99)
Glucose-Capillary: 134 mg/dL — ABNORMAL HIGH (ref 70–99)
Glucose-Capillary: 163 mg/dL — ABNORMAL HIGH (ref 70–99)

## 2023-10-20 MED ORDER — METOPROLOL TARTRATE 5 MG/5ML IV SOLN
5.0000 mg | Freq: Once | INTRAVENOUS | Status: AC
  Administered 2023-10-20: 5 mg via INTRAVENOUS
  Filled 2023-10-20: qty 5

## 2023-10-20 MED ORDER — LORAZEPAM 0.5 MG PO TABS: 0.5000 mg | ORAL_TABLET | Freq: Every evening | ORAL | 0 refills | Status: AC | PRN

## 2023-10-20 MED ORDER — ESCITALOPRAM OXALATE 10 MG PO TABS: 10.0000 mg | ORAL_TABLET | Freq: Every day | ORAL | Status: DC

## 2023-10-20 MED ORDER — GABAPENTIN 400 MG PO CAPS
400.0000 mg | ORAL_CAPSULE | Freq: Three times a day (TID) | ORAL | Status: DC
  Administered 2023-10-20 – 2023-10-22 (×5): 400 mg via ORAL
  Filled 2023-10-20 (×5): qty 1

## 2023-10-20 MED ORDER — DOXYCYCLINE HYCLATE 100 MG PO TABS: 100.0000 mg | ORAL_TABLET | Freq: Two times a day (BID) | ORAL | Status: DC

## 2023-10-20 MED ORDER — METOPROLOL TARTRATE 5 MG/5ML IV SOLN
5.0000 mg | Freq: Once | INTRAVENOUS | Status: DC
  Filled 2023-10-20: qty 5

## 2023-10-20 MED ORDER — METOPROLOL SUCCINATE ER 25 MG PO TB24
12.5000 mg | ORAL_TABLET | Freq: Every day | ORAL | Status: DC
  Administered 2023-10-20: 12.5 mg via ORAL
  Filled 2023-10-20: qty 1

## 2023-10-20 MED ORDER — METOPROLOL SUCCINATE ER 25 MG PO TB24: 25.0000 mg | ORAL_TABLET | Freq: Every day | ORAL | Status: DC

## 2023-10-20 MED ORDER — BUPRENORPHINE HCL-NALOXONE HCL 8-2 MG SL FILM: 1.0000 | ORAL_FILM | Freq: Three times a day (TID) | SUBLINGUAL | 0 refills | Status: AC

## 2023-10-20 MED ORDER — METOPROLOL SUCCINATE ER 50 MG PO TB24
50.0000 mg | ORAL_TABLET | Freq: Every day | ORAL | Status: DC
  Administered 2023-10-21 – 2023-10-22 (×2): 50 mg via ORAL
  Filled 2023-10-20 (×2): qty 1

## 2023-10-20 MED ORDER — PREDNISONE 10 MG PO TABS: 20.0000 mg | ORAL_TABLET | Freq: Every day | ORAL | Status: DC

## 2023-10-20 NOTE — Progress Notes (Signed)
Physical Therapy Treatment Patient Details Name: Christian Fields MRN: [id number] DOB: Aug 18, 1956 Today's Date: 10/20/2023   History of Present Illness Pt is a 67 y.o. male presenting to hospital 10/16/23 with c/o worsening SOB; recent discharge from hospital about 1 week ago to SNF.  Pt admitted with sepsis, acute on chronic respiratory failure with hypoxia and hypercapnia, COPD with acute exacerbation, acute renal failure.  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 PMH includes anemia, anxiety, asthma, CA, chronic back pain, COPD on home O2, DM, gout, HA, h/o blood clots, h/o kidney stones, neuropathy, PNA, sleep apnea, foot ulcer, chronic pain syndrome, s/p I&D L foot 07/11/23.    PT Comments  Pt received in recliner and with encouragement agreed to PT session. Pt performed STS without the use of AD CGA while maintain LLE WB precautions. Due to fatigue and anxiety pt requested to sit back down. Vitals while seated read 90% Spo2 on 4L of O2 and 109 bpm, VC necessary for pursed lip breathing which assisted in improving vitals. Due to feeling anxious with mobility, pt refused further activity within the session. Pt tolerated Tx fair today and will continue to benefit from skilled PT sessions to improve strength, endurance, functional mobility and activity tolerance to maximize safety.    If plan is discharge home, recommend the following: A lot of help with walking and/or transfers;A little help with bathing/dressing/bathroom;Assistance with cooking/housework;Direct supervision/assist for medications management;Help with stairs or ramp for entrance;Assist for transportation   Can travel by private vehicle     No  Equipment Recommendations  Other (comment) (TBD at next facility)    Recommendations for Other Services       Precautions / Restrictions Precautions Precautions: Fall Precaution Comments: Monitor  O2 Restrictions Weight Bearing Restrictions: Yes LLE Weight Bearing: Non weight bearing     Mobility  Bed Mobility               General bed mobility comments: Pt received in recliner. Bed mobility not observed.    Transfers Overall transfer level: Needs assistance Equipment used: None Transfers: Sit to/from Stand Sit to Stand: Contact guard assist           General transfer comment: Pt performed STS at recliner CGA while maintain WB precautions.    Ambulation/Gait               General Gait Details: Pt refused further mobility. Amb not observed   Stairs             Wheelchair Mobility     Tilt Bed    Modified Rankin (Stroke Patients Only)       Balance Overall balance assessment: Needs assistance Sitting-balance support: No upper extremity supported, Feet supported Sitting balance-Leahy Scale: Normal     Standing balance support: No upper extremity supported Standing balance-Leahy Scale: Fair Standing balance comment: CGA to prevent posterior LOB as saftey precaution                            Cognition Arousal: Alert Behavior During Therapy: WFL for tasks assessed/performed Overall Cognitive Status: Within Functional Limits for tasks assessed                                 General Comments: AOx4. Pt pleasant and willing to participate in PT session with encouragement.  Exercises      General Comments        Pertinent Vitals/Pain Pain Assessment Pain Assessment: No/denies pain    Home Living                          Prior Function            PT Goals (current goals can now be found in the care plan section) Acute Rehab PT Goals PT Goal Formulation: With patient Time For Goal Achievement: 11/01/23 Potential to Achieve Goals: Good Progress towards PT goals: Progressing toward goals    Frequency    Min 1X/week      PT Plan      Co-evaluation               AM-PAC PT "6 Clicks" Mobility   Outcome Measure  Help needed turning from your back to your side while in a flat bed without using bedrails?: None Help needed moving from lying on your back to sitting on the side of a flat bed without using bedrails?: A Little Help needed moving to and from a bed to a chair (including a wheelchair)?: A Little Help needed standing up from a chair using your arms (e.g., wheelchair or bedside chair)?: A Little Help needed to walk in hospital room?: A Lot Help needed climbing 3-5 steps with a railing? : Total 6 Click Score: 16    End of Session Equipment Utilized During Treatment: Gait belt Activity Tolerance: Other (comment) (Pt limited due to feeling anxious with mobility.) Patient left: in chair;with call bell/phone within reach;with chair alarm set;with family/visitor present Nurse Communication: Mobility status PT Visit Diagnosis: Other abnormalities of gait and mobility (R26.89);Muscle weakness (generalized) (M62.81)     Time: 1610-9604 PT Time Calculation (min) (ACUTE ONLY): 14 min  Charges:    $Therapeutic Activity: 8-22 mins PT General Charges $$ ACUTE PT VISIT: 1 Visit                     Christian Fields SPT, LAT, ATC   Christian Fields 10/20/2023, 5:02 PM

## 2023-10-20 NOTE — Care Management Important Message (Signed)
Important Message  Patient Details  Name: Christian Fields MRN: [id number] Date of Birth: Dec 27, 1955   Important Message Given:  Yes - Medicare IM   [name] 10/20/2023, 2:54 PM

## 2023-10-20 NOTE — TOC Progression Note (Signed)
Transition of Care [facility]) - Progression Note    Patient Details  Name: Christian Fields MRN: [id number] Date of Birth: 05-16-1956  [facility]) CM/SW Contact  Darolyn Rua, [place] Phone Number: 10/20/2023, 9:45 AM  Clinical Narrative:     Insurance auth pending for [facility] at this time.   Expected Discharge Plan: Skilled Nursing Facility Barriers to Discharge: Continued Medical Work up  Expected Discharge Plan and Services In-house Referral: Clinical Social Work     Living arrangements for the past 2 months: Mobile Home                                       Social Determinants of Health (SDOH) Interventions SDOH Screenings   Food Insecurity: No Food Insecurity (10/19/2023)  Housing: Low Risk  (10/19/2023)  Transportation Needs: No Transportation Needs (10/19/2023)  Utilities: Not At Risk (10/19/2023)  Financial Resource Strain: Low Risk  (04/19/2023)   Received from [facility], [facility]  Tobacco Use: Medium Risk (10/19/2023)    Readmission Risk Interventions    10/09/2023   11:34 AM 10/07/2023    1:48 PM 09/03/2023    2:13 PM  Readmission Risk Prevention Plan  Transportation Screening  Complete Complete  Medication Review Oceanographer)  Complete Complete  PCP or Specialist appointment within 3-5 days of discharge   Complete  HRI or Home Care Consult  Complete Complete  SW Recovery Care/Counseling Consult Complete  Complete  Palliative Care Screening  Not Applicable Not Applicable  Skilled Nursing Facility  Complete Not Applicable

## 2023-10-20 NOTE — Evaluation (Signed)
Occupational Therapy Evaluation Patient Details Name: SOSUKE CORTRIGHT MRN: [id number] DOB: 1956/05/14 Today's Date: 10/20/2023   History of Present Illness Pt is a 67 y.o. male presenting to hospital 10/16/23 with c/o worsening SOB; recent discharge from hospital about 1 week ago to SNF.  Pt admitted with sepsis, acute on chronic respiratory failure with hypoxia and hypercapnia, COPD with acute exacerbation, acute renal failure.  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 PMH includes anemia, anxiety, asthma, CA, chronic back pain, COPD on home O2, DM, gout, HA, h/o blood clots, h/o kidney stones, neuropathy, PNA, sleep apnea, foot ulcer, chronic pain syndrome, s/p I&D L foot 07/11/23.   Clinical Impression   Pt was seen for OT evaluation this date. Prior to hospital admission, pt reports MOD I with ADLs. Pt lives with children, both son and daughter help with IADL's. Pt presents to acute OT demonstrating impaired ADL performance and functional mobility 2/2 (See OT problem list for additional functional deficits). Upon arrival to room pt supine in bed, agreeable to tx. Pt pleasant and cooperative during session. Pt completed bed mobility with supervision. Pt completed a sit<>stand t/f with CGA. Pt declined use of RW. Pt completed a stand pivot from bedside to chair with CGA +use of rails. Pt's O2 monitored, desat with activity, restored at the end in the 90's. Pt left in chair with breakfast tray, call bell within reach, and chair alarm. Pt would benefit from skilled OT services to address noted impairments and functional limitations (see below for any additional details) in order to maximize safety and independence while minimizing falls risk and caregiver burden. Anticipate the need for follow up OT services upon acute hospital DC.        If plan is discharge home, recommend the following: A little help with walking and/or  transfers;Assistance with cooking/housework;Assist for transportation;Help with stairs or ramp for entrance;A little help with bathing/dressing/bathroom    Functional Status Assessment  Patient has had a recent decline in their functional status and demonstrates the ability to make significant improvements in function in a reasonable and predictable amount of time.  Equipment Recommendations  BSC/3in1    Recommendations for Other Services       Precautions / Restrictions Precautions Precautions: Fall Precaution Comments: Monitor O2 Restrictions Weight Bearing Restrictions: Yes LLE Weight Bearing: Non weight bearing      Mobility Bed Mobility Overal bed mobility: Needs Assistance Bed Mobility: Supine to Sit, Sit to Supine     Supine to sit: Supervision Sit to supine: Supervision        Transfers Overall transfer level: Needs assistance   Transfers: Sit to/from Stand Sit to Stand: Contact guard assist Stand pivot transfers: Contact guard assist                Balance Overall balance assessment: Needs assistance Sitting-balance support: No upper extremity supported, Feet supported Sitting balance-Leahy Scale: Normal     Standing balance support: No upper extremity supported Standing balance-Leahy Scale: Fair                             ADL either performed or assessed with clinical judgement   ADL Overall ADL's : Needs assistance/impaired                         Toilet Transfer: Set up;BSC/3in1;Stand-pivot;Contact guard assist Toilet Transfer Details (indicate cue type and reason): Simulated  Functional mobility during ADLs: Set up;Contact guard assist;Cueing for safety       Vision Baseline Vision/History: 1 Wears glasses Patient Visual Report: No change from baseline       Perception         Praxis         Pertinent Vitals/Pain Pain Assessment Pain Assessment: No/denies pain     Extremity/Trunk  Assessment Upper Extremity Assessment Upper Extremity Assessment: Overall WFL for tasks assessed   Lower Extremity Assessment Lower Extremity Assessment: Generalized weakness   Cervical / Trunk Assessment Cervical / Trunk Assessment: Normal   Communication Communication Communication: No apparent difficulties Cueing Techniques: Verbal cues   Cognition Arousal: Alert Behavior During Therapy: WFL for tasks assessed/performed Overall Cognitive Status: Within Functional Limits for tasks assessed                                       General Comments       Exercises     Shoulder Instructions      Home Living Family/patient expects to be discharged to:: Skilled nursing facility Living Arrangements: Children Available Help at Discharge: Family;Available PRN/intermittently Type of Home: Mobile home Home Access: Stairs to enter Entrance Stairs-Number of Steps: 8 Entrance Stairs-Rails: Right;Left;Can reach both Home Layout: One level     Bathroom Shower/Tub: Chief Strategy Officer: Standard     Home Equipment: Agricultural consultant (2 wheels);Cane - single point;Rollator (4 wheels)   Additional Comments: Home o2 use, was using knee scooter for house mobility.      Prior Functioning/Environment Prior Level of Function : Needs assist       Physical Assist : Mobility (physical);ADLs (physical)   ADLs (physical): Bathing Mobility Comments: Pt reports he was in rehab prior to admission; working on walking with walker (NWB L LE) with assist.  At baseline ambulates without AD use. ADLs Comments: reports MOD I with ADL and requires assist with cooking, cleaning, driving which son and daughter help with as needed. Pt reports that he has been bathing in a tub shower.        OT Problem List: Decreased strength;Decreased range of motion;Decreased activity tolerance;Decreased coordination;Impaired balance (sitting and/or standing);Decreased safety awareness       OT Treatment/Interventions: Self-care/ADL training;Therapeutic exercise;Therapeutic activities;Energy conservation;DME and/or AE instruction    OT Goals(Current goals can be found in the care plan section) Acute Rehab OT Goals Patient Stated Goal: to get better. OT Goal Formulation: With patient Time For Goal Achievement: 11/03/23 Potential to Achieve Goals: Good  OT Frequency: Min 1X/week    Co-evaluation              AM-PAC OT "6 Clicks" Daily Activity     Outcome Measure Help from another person eating meals?: None Help from another person taking care of personal grooming?: None Help from another person toileting, which includes using toliet, bedpan, or urinal?: A Little Help from another person bathing (including washing, rinsing, drying)?: A Little Help from another person to put on and taking off regular upper body clothing?: None Help from another person to put on and taking off regular lower body clothing?: A Little 6 Click Score: 21   End of Session Equipment Utilized During Treatment: Oxygen Nurse Communication: Mobility status  Activity Tolerance: Patient tolerated treatment well Patient left: in chair;with call bell/phone within reach;with chair alarm set  OT Visit Diagnosis: Unsteadiness on feet (R26.81);Other abnormalities  of gait and mobility (R26.89);Muscle weakness (generalized) (M62.81)                Time: 1610-9604 OT Time Calculation (min): 23 min Charges:     Butch Penny, SOT

## 2023-10-20 NOTE — Progress Notes (Signed)
Progress Note   Patient: Christian Fields ION:[id number] DOB: Mar 19, 1956 DOA: 10/16/2023     4 DOS: the patient was seen and examined on 10/20/2023   Brief hospital course: Christian Fields is a 67 y.o. male with medical history significant of chronic hypoxic respiratory failure on 4 L, end-stage COPD, hypertension, chronic pain on Suboxone, HFpEF, type 2 diabetes, PAD, hyperlipidemia, gout, who presents to the ED due to shortness of breath.  Pt was admitted to the hospital for further evaluation and management of acute on chronic respiratory failure with hypoxia and hypercapnia due to suspected pneumonia and COPD with acute exacerbation.  He was initially placed on BiPAP due to respiratory distress with signficant work of breathing and weaned back to baseline over several days.   He is weaned to his baseline of 4L [place] and doing well. He will be able to dc back to SNF whenever he is accepted/approved.  Assessment and Plan: Sepsis (HCC) Acute on chronic respiratory failure with hypoxia and hypercapnia (HCC) CAP COPD with acute exacerbation (HCC) History of chronic hypoxic respiratory failure with hypercapnia on 4 L at baseline.  Acute worsening of both hypoxia and hypercapnia on admission, potentially due to underlying pneumonia.  Initial concern at SNF for hypervolemia, however he appears euvolemic. --Supplement O2 and wean as tolerated --Continue IV Abx until dc - now on doxycycline and CTX from Levaquin, from cefepime/azithromycin to IV levofloxacin given allergic reaction to azithromycin) --Target spO2 88 -- 95% --Mgmt of COPD and PNA as outlined --Incentive spirometer & flutter - S/p Solu-Medrol 125 mg once - DuoNebs every 6 hours - Pulmicort twice daily  Acute renal failure superimposed on stage 2 chronic kidney disease (HCC) Mild AKI on admission, likely due to diuretic use this week and subsequent hypovolemia. - S/p half liter bolus in ED - Avoid nephrotoxic agents -- Monitor  BMP's  Chronic HFpEF- History of HFrEF with last EF of > 55%.  He has been on Lasix at SNF.  Appears euvolemic to mildly hypovolemic on admission.   - Hold further diuretics - Daily weights & strict IO's  Hypertension - metoprolol resumed  NSCLC of left lung (HCC) Previous history of non-small cell lung cancer s/p radiation.  May be contributing overall to patient's extremely poor lung function  Type 2 diabetes mellitus (HCC) - Hold home antiglycemic agents - SSI, moderate  Chronic pain syndrome - Continue home regimen  Tremor of both hands- improved Suspect from higher dose gabapentin. --Reduce gabapentin to 200 mg TID - started back his metoprolol --Monitor for improvement  Hypomagnesemia 2g IV Mg-sulfate ordered for Mg 1.6 today. Monitor & replace Mg PRN  Anxiety- chronic, poorly controlled. Exacerbated in hospital. Describes insomnia - increased lexapro - at bedtime ativan  Subjective: Pt reports had improvement in his sleep and anxiety with medication last night. He is still anxious and wants to be discharged as soon as possible. Denies respiratory distress, chest pain.  Physical Exam: Vitals:   10/20/23 0037 10/20/23 0331 10/20/23 0400 10/20/23 0500  BP: 107/89 128/89 136/87   Pulse: (!) 127 (!) 112 (!) 110   Resp: 16 16 14    Temp:    98.1 F (36.7 C)  TempSrc:    Oral  SpO2: 91% 97% 95%   Weight:      Height:       General exam: awake, alert, no acute distress, chronically ill appearing HEENT: moist mucus membranes, hearing grossly normal  Respiratory system: mildly improved aeration today, negative wheezes, normal  respiratory effort at rest with accessory muscle use. On 4 L [place] Cardiovascular system: normal S1/S2, RRR, no pedal edema.   Central nervous system: A&O x3. no gross focal neurologic deficits, normal speech Extremities: moves all, no edema, normal tone Skin: dry, intact, normal temperature Psychiatry: anxious mood, congruent affect, judgement  and insight appear normal  Data Reviewed:    Latest Ref Rng & Units 10/19/2023    5:49 AM 10/18/2023    4:52 AM 10/17/2023    4:51 AM  CBC  WBC 4.0 - 10.5 K/uL 18.2  17.0  16.2   Hemoglobin 13.0 - 17.0 g/dL 8.9  7.9  8.6   Hematocrit 39.0 - 52.0 % 29.2  26.7  29.2   Platelets 150 - 400 K/uL 242  208  186       Latest Ref Rng & Units 10/19/2023    5:49 AM 10/18/2023    4:52 AM 10/17/2023    4:51 AM  BMP  Glucose 70 - 99 mg/dL 960  454  098   BUN 8 - 23 mg/dL 32  32  32   Creatinine 0.61 - 1.24 mg/dL 1.19  1.47  8.29   Sodium 135 - 145 mmol/L 137  139  139   Potassium 3.5 - 5.1 mmol/L 3.9  4.3  4.9   Chloride 98 - 111 mmol/L 96  96  98   CO2 22 - 32 mmol/L 32  35  31   Calcium 8.9 - 10.3 mg/dL 8.7  8.1  8.7    Hbg F6O 5.7%  Family Communication: None present   Disposition: Status is: Inpatient Remains inpatient appropriate because: awaiting SNF placement   Planned Discharge Destination: Skilled nursing facility    Time spent: 38 minutes  Author: Leeroy Bock, MD 10/20/2023 7:41 AM  For on call review [email or web address].

## 2023-10-21 DIAGNOSIS — J9601 Acute respiratory failure with hypoxia: Secondary | ICD-10-CM | POA: Diagnosis not present

## 2023-10-21 DIAGNOSIS — J9602 Acute respiratory failure with hypercapnia: Secondary | ICD-10-CM | POA: Diagnosis not present

## 2023-10-21 LAB — CULTURE, BLOOD (ROUTINE X 2)
Culture: NO GROWTH
Culture: NO GROWTH
Special Requests: ADEQUATE

## 2023-10-21 LAB — GLUCOSE, CAPILLARY
Glucose-Capillary: 111 mg/dL — ABNORMAL HIGH (ref 70–99)
Glucose-Capillary: 124 mg/dL — ABNORMAL HIGH (ref 70–99)
Glucose-Capillary: 210 mg/dL — ABNORMAL HIGH (ref 70–99)

## 2023-10-21 MED ORDER — HYDROXYZINE HCL 10 MG PO TABS
10.0000 mg | ORAL_TABLET | Freq: Once | ORAL | Status: AC
  Administered 2023-10-21: 10 mg via ORAL
  Filled 2023-10-21: qty 1

## 2023-10-21 NOTE — Progress Notes (Signed)
Progress Note Patient: Christian Fields:[id number] DOB: [birth year] DOA: 10/16/2023     5 DOS: the patient was seen and examined on 10/21/2023   Brief hospital course: Christian Fields is a 67 y.o. male with medical history significant of chronic hypoxic respiratory failure on 4 L, end-stage COPD, hypertension, chronic pain on Suboxone, HFpEF, type 2 diabetes, PAD, hyperlipidemia, gout, who presents to the ED due to shortness of breath.  Pt was admitted to the hospital for further evaluation and management of acute on chronic respiratory failure with hypoxia and hypercapnia due to suspected pneumonia and COPD with acute exacerbation.  He was initially placed on BiPAP due to respiratory distress with signficant work of breathing and weaned back to baseline over several days.   He is weaned to his baseline of 4L [place] and doing well. He will be able to dc back to SNF whenever he is accepted/approved. Unfortunately, discharge is delayed while he is in medical review at the facility and he continues to say he will leave AMA. He does not have home support and is not able to go home independently, lacks the insight of consequences of leaving AMA. TOC is following up with his   Assessment and Plan: Sepsis (HCC) Acute on chronic respiratory failure with hypoxia and hypercapnia (HCC) CAP COPD with acute exacerbation (HCC) History of chronic hypoxic respiratory failure with hypercapnia on 4 L at baseline.  Acute worsening of both hypoxia and hypercapnia on admission, potentially due to underlying pneumonia. appears euvolemic. --Supplement O2 and continue 4L --Continue doxycycline and CTX from Levaquin, from cefepime/azithromycin -Incentive spirometer & flutter - S/p Solu-Medrol 125 mg once - DuoNebs every 6 hours - Pulmicort twice daily  Acute renal failure superimposed on stage 2 chronic kidney disease (HCC) Mild AKI on admission, likely due to diuretic use this week and subsequent hypovolemia. - S/p  half liter bolus in ED - Avoid nephrotoxic agents -- Monitor BMP's  Chronic HFpEF- History of HFrEF with last EF of > 55%.  He has been on Lasix at SNF.  Appears euvolemic to mildly hypovolemic on admission.   - Hold further diuretics - Daily weights & strict IO's  Hypertension - metoprolol resumed  NSCLC of left lung (HCC) Previous history of non-small cell lung cancer s/p radiation.  May be contributing overall to patient's extremely poor lung function  Type 2 diabetes mellitus (HCC) - Hold home antiglycemic agents - SSI, moderate  Chronic pain syndrome - Continue home regimen  Tremor of both hands- improved --Reduce gabapentin to 200 mg TID - started back his metoprolol --Monitor for improvement  Hypomagnesemia Monitor & replace Mg PRN  Anxiety- chronic, poorly controlled. Exacerbated in hospital. Describes insomnia - increased lexapro - at bedtime ativan - atarax PRN  Subjective: Pt reports getting good sleep last night. He has no respiratory distress. States that if he isn't discharged this am that he is going to leave AMA.   Physical Exam: Vitals:   10/20/23 2000 10/21/23 0000 10/21/23 0335 10/21/23 0400  BP: (!) 142/83 120/82  121/77  Pulse: (!) 103 (!) 105  (!) 109  Resp: 18 14  19   Temp: 98.7 F (37.1 C) 97.8 F (36.6 C) 97.9 F (36.6 C)   TempSrc: Oral Oral Oral   SpO2: 94% 91%  100%  Weight:      Height:       General exam: awake, alert, no acute distress, chronically ill appearing HEENT: moist mucus membranes, hearing grossly normal  Respiratory system: mildly  improved aeration today, negative wheezes, normal respiratory effort at rest with accessory muscle use. On 4 L [place] Cardiovascular system: normal S1/S2, RRR, no pedal edema.   Central nervous system: A&O x3. no gross focal neurologic deficits, normal speech Extremities: moves all, no edema, normal tone Skin: dry, intact, normal temperature Psychiatry: anxious mood, congruent affect, judgement  and insight appear impaired  Data Reviewed:    Latest Ref Rng & Units 10/19/2023    5:49 AM 10/18/2023    4:52 AM 10/17/2023    4:51 AM  CBC  WBC 4.0 - 10.5 K/uL 18.2  17.0  16.2   Hemoglobin 13.0 - 17.0 g/dL 8.9  7.9  8.6   Hematocrit 39.0 - 52.0 % 29.2  26.7  29.2   Platelets 150 - 400 K/uL 242  208  186       Latest Ref Rng & Units 10/19/2023    5:49 AM 10/18/2023    4:52 AM 10/17/2023    4:51 AM  BMP  Glucose 70 - 99 mg/dL 161  096  045   BUN 8 - 23 mg/dL 32  32  32   Creatinine 0.61 - 1.24 mg/dL 4.09  8.11  9.14   Sodium 135 - 145 mmol/L 137  139  139   Potassium 3.5 - 5.1 mmol/L 3.9  4.3  4.9   Chloride 98 - 111 mmol/L 96  96  98   CO2 22 - 32 mmol/L 32  35  31   Calcium 8.9 - 10.3 mg/dL 8.7  8.1  8.7    Hbg N8G 5.7%  Family Communication: None present   Disposition: Status is: Inpatient Remains inpatient appropriate because: awaiting SNF placement   Planned Discharge Destination: Skilled nursing facility    Time spent: 38 minutes  Author: Leeroy Bock, MD 10/21/2023 7:44 AM  For on call review [email or web address].

## 2023-10-21 NOTE — Progress Notes (Signed)
Physical Therapy Treatment Patient Details Name: RACIEL RIERSON MRN: [id number] DOB: 24-Dec-1955 Today's Date: 10/21/2023   History of Present Illness Pt is a 67 y.o. male presenting to hospital 10/16/23 with c/o worsening SOB; recent discharge from hospital about 1 week ago to SNF.  Pt admitted with sepsis, acute on chronic respiratory failure with hypoxia and hypercapnia, COPD with acute exacerbation, acute renal failure.  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 PMH includes anemia, anxiety, asthma, CA, chronic back pain, COPD on home O2, DM, gout, HA, h/o blood clots, h/o kidney stones, neuropathy, PNA, sleep apnea, foot ulcer, chronic pain syndrome, s/p I&D L foot 07/11/23.    PT Comments  Pt received in recliner on 4.5L of O2 [place] and agreed to PT session. Pt performed STS with the use of RW (2wheels) CGA for increased stability and potential increased duration of standing balance. Pt stood for ~15sec prior to needing a sitting break due to SOB; pt desat to 85% SpO2 on 4.5L, L of O2 increased to 6L. Pt performed STS without AD/UE support ModA. Pt maintained WB precautions and required VC for pursed lip breathing throughout session. Vitals monitored during session and addressed accordingly. Pt completed session in recliner on 4.5L of O2 . Pt tolerated Tx fair and will continue to benefit from skilled PT sessions to improve functional mobility, activity tolerance, endurance and strength following D/C.     If plan is discharge home, recommend the following: A lot of help with walking and/or transfers;A little help with bathing/dressing/bathroom;Assistance with cooking/housework;Direct supervision/assist for medications management;Help with stairs or ramp for entrance;Assist for transportation   Can travel by private vehicle     No  Equipment Recommendations  Other (comment) (TBD at next facility)    Recommendations for Other  Services       Precautions / Restrictions Precautions Precautions: Fall Precaution Comments: Monitor O2 Restrictions Weight Bearing Restrictions: Yes LLE Weight Bearing: Non weight bearing Other Position/Activity Restrictions: per MD note, 6L O2 with activity     Mobility  Bed Mobility               General bed mobility comments: Pt received in recliner. Bed mobility not observed.    Transfers Overall transfer level: Needs assistance Equipment used: None, Rolling walker (2 wheels) Transfers: Sit to/from Stand Sit to Stand: Contact guard assist, Mod assist           General transfer comment: Pt performed STS at recliner CGA ~15sec while maintaining WB precautions with the use of RW (2wheels). Pt performed 2nd STS at recliner ModA ~25sec while maintaining WB precautions.    Ambulation/Gait               General Gait Details: Pt refused further mobility. Amb not observed   Stairs             Wheelchair Mobility     Tilt Bed    Modified Rankin (Stroke Patients Only)       Balance Overall balance assessment: Needs assistance Sitting-balance support: No upper extremity supported, Feet supported Sitting balance-Leahy Scale: Normal     Standing balance support: Single extremity supported Standing balance-Leahy Scale: Fair Standing balance comment: Pt presented with increased stability while having BUE support on RW, however pt can also maintain balance with single UE                            Cognition  Arousal: Alert Behavior During Therapy: WFL for tasks assessed/performed Overall Cognitive Status: Within Functional Limits for tasks assessed                                 General Comments: AOx4. Pt pleasant and willing to participate in PT session with encouragement.        Exercises      General Comments        Pertinent Vitals/Pain Pain Assessment Pain Assessment: No/denies pain    Home Living                           Prior Function            PT Goals (current goals can now be found in the care plan section) Acute Rehab PT Goals Patient Stated Goal: to go back to rehab and get better PT Goal Formulation: With patient Time For Goal Achievement: 11/01/23 Potential to Achieve Goals: Good Progress towards PT goals: Progressing toward goals    Frequency    Min 1X/week      PT Plan      Co-evaluation              AM-PAC PT "6 Clicks" Mobility   Outcome Measure  Help needed turning from your back to your side while in a flat bed without using bedrails?: None Help needed moving from lying on your back to sitting on the side of a flat bed without using bedrails?: A Little Help needed moving to and from a bed to a chair (including a wheelchair)?: A Little Help needed standing up from a chair using your arms (e.g., wheelchair or bedside chair)?: A Little Help needed to walk in hospital room?: A Lot Help needed climbing 3-5 steps with a railing? : Total 6 Click Score: 16    End of Session Equipment Utilized During Treatment: Gait belt Activity Tolerance: Patient limited by fatigue Patient left: in chair;with call bell/phone within reach;with chair alarm set;with nursing/sitter in room Nurse Communication: Mobility status PT Visit Diagnosis: Other abnormalities of gait and mobility (R26.89);Muscle weakness (generalized) (M62.81)     Time: 1660-6301 PT Time Calculation (min) (ACUTE ONLY): 22 min  Charges:    $Therapeutic Activity: 8-22 mins PT General Charges $$ ACUTE PT VISIT: 1 Visit                     Jsiah Menta [place] Howard SPT, LAT, ATC   Nikole Swartzentruber Sauvignon-Howard 10/21/2023, 3:15 PM

## 2023-10-21 NOTE — Plan of Care (Signed)
Progressing towards goals

## 2023-10-21 NOTE — TOC Progression Note (Signed)
Transition of Care [facility]) - Progression Note    Patient Details  Name: Christian Fields MRN: [id number] Date of Birth: Jan 15, 1956  Transition of Care [facility]) CM/SW Contact  Darolyn Rua, [place] Phone Number: 10/21/2023, 9:09 AM  Clinical Narrative:     Per [facility] CSW called to inquire as still shows pending auth in portal, [facility] reports that it is under "medical review" and that once a decision is made the portal will be updated. They also reported that if they request a peer to peer they will call this CSW.   MD updated on above.   Expected Discharge Plan: Skilled Nursing Facility Barriers to Discharge: Continued Medical Work up  Expected Discharge Plan and Services In-house Referral: Clinical Social Work     Living arrangements for the past 2 months: Mobile Home                                       Social Determinants of Health (SDOH) Interventions SDOH Screenings   Food Insecurity: No Food Insecurity (10/19/2023)  Housing: Low Risk  (10/19/2023)  Transportation Needs: No Transportation Needs (10/19/2023)  Utilities: Not At Risk (10/19/2023)  Financial Resource Strain: Low Risk  (04/19/2023)   Received from [facility], [facility]  Tobacco Use: Medium Risk (10/19/2023)    Readmission Risk Interventions    10/09/2023   11:34 AM 10/07/2023    1:48 PM 09/03/2023    2:13 PM  Readmission Risk Prevention Plan  Transportation Screening  Complete Complete  Medication Review [facility])  Complete Complete  PCP or Specialist appointment within 3-5 days of discharge   Complete  HRI or Home Care Consult  Complete Complete  SW Recovery Care/Counseling Consult Complete  Complete  Palliative Care Screening  Not Applicable Not Applicable  Skilled Nursing Facility  Complete Not Applicable

## 2023-10-21 NOTE — Progress Notes (Signed)
Patient had $160.00 dollars in cash. Advised patient to have security lock money up in the safe. Patient agreed to have money locked up. Security called, form filled out. Forms placed in patients chart. Patient also stated he does not want any hospice or palliative consults or any of them coming to his room.

## 2023-10-21 NOTE — Progress Notes (Signed)
Patient requested to leave AMA.  He was told he would be unable to leave unless he had someone come to the floor to pick him up.    His daughter and son in law called and told charge nurse they will not pick up patient because they do not believe he should leave.    Charge nurse explained to them that the hospital has not discharged him and unless someone comes to pick him up, he will have to stay.

## 2023-10-22 DIAGNOSIS — J441 Chronic obstructive pulmonary disease with (acute) exacerbation: Secondary | ICD-10-CM | POA: Diagnosis not present

## 2023-10-22 DIAGNOSIS — J9621 Acute and chronic respiratory failure with hypoxia: Secondary | ICD-10-CM | POA: Diagnosis not present

## 2023-10-22 DIAGNOSIS — J9622 Acute and chronic respiratory failure with hypercapnia: Secondary | ICD-10-CM | POA: Diagnosis not present

## 2023-10-22 LAB — GLUCOSE, CAPILLARY
Glucose-Capillary: 170 mg/dL — ABNORMAL HIGH (ref 70–99)
Glucose-Capillary: 105 mg/dL — ABNORMAL HIGH (ref 70–99)
Glucose-Capillary: 175 mg/dL — ABNORMAL HIGH (ref 70–99)

## 2023-10-22 MED ORDER — DOXYCYCLINE HYCLATE 100 MG PO TABS: 100.0000 mg | ORAL_TABLET | Freq: Two times a day (BID) | ORAL | 0 refills | Status: AC

## 2023-10-22 MED ORDER — ESCITALOPRAM OXALATE 10 MG PO TABS: 10.0000 mg | ORAL_TABLET | Freq: Every day | ORAL | 0 refills | Status: DC

## 2023-10-22 MED ORDER — PREDNISONE 10 MG PO TABS
20.0000 mg | ORAL_TABLET | Freq: Every day | ORAL | 0 refills | Status: AC
Start: 2023-10-22 — End: 2023-10-25

## 2023-10-22 NOTE — Plan of Care (Signed)

## 2023-10-22 NOTE — TOC Progression Note (Signed)
Transition of Care [facility]) - Progression Note    Patient Details  Name: Christian Fields MRN: [id number] Date of Birth: [birth year]  Transition of Care [facility]) CM/SW Contact  Darolyn Rua, [place] Phone Number: 10/22/2023, 9:30 AM  Clinical Narrative:     Patient's insurance auth for [employer] has gone to peer to peer review MD aware  due by 12:30 pm today, call [phone] option 5, provided patient's subscriber ID [id number]   Expected Discharge Plan: Skilled Nursing Facility Barriers to Discharge: Continued Medical Work up  Expected Discharge Plan and Services In-house Referral: Clinical Social Work     Living arrangements for the past 2 months: Mobile Home                                       Social Determinants of Health (SDOH) Interventions SDOH Screenings   Food Insecurity: No Food Insecurity (10/19/2023)  Housing: Low Risk  (10/19/2023)  Transportation Needs: No Transportation Needs (10/19/2023)  Utilities: Not At Risk (10/19/2023)  Financial Resource Strain: Low Risk  (04/19/2023)   Received from [facility], [facility]  Tobacco Use: Medium Risk (10/19/2023)    Readmission Risk Interventions    10/09/2023   11:34 AM 10/07/2023    1:48 PM 09/03/2023    2:13 PM  Readmission Risk Prevention Plan  Transportation Screening  Complete Complete  Medication Review Oceanographer)  Complete Complete  PCP or Specialist appointment within 3-5 days of discharge   Complete  HRI or Home Care Consult  Complete Complete  SW Recovery Care/Counseling Consult Complete  Complete  Palliative Care Screening  Not Applicable Not Applicable  Skilled Nursing Facility  Complete Not Applicable

## 2023-10-22 NOTE — Progress Notes (Signed)
PT Cancellation Note  Patient Details Name: REVERE WHEELING MRN: [id number] DOB: 11/29/1956   Cancelled Treatment:    Reason Eval/Treat Not Completed: Patient declined, no reason specified Patient waiting for insurance to approve or deny rehab via peer to peer. He states he is going home today if they deny. He declines PT this pm due to this. Will re-attempt tomorrow if patient has not discharged.    Haylee Mcanany 10/22/2023, 3:26 PM

## 2023-10-22 NOTE — Discharge Summary (Signed)
Physician Discharge Summary   Patient: Christian Fields MRN: [id number] DOB: 1956-07-15  Admit date:     10/16/2023  Discharge date: 10/22/2023  Discharge Physician: Pennie Banter   PCP: Alease [place], MD   Recommendations at discharge:  {Tip this will not be part of the note when signed- Example include specific recommendations for outpatient follow-up, pending tests to follow-up on. (Optional):26781}  Follow up with Primary Care in 1-2 weeks Follow up with Podiatry as scheduled Follow up with Pulmonology as scheduled  Discharge Diagnoses: Principal Problem:   Sepsis (HCC) Active Problems:   Acute on chronic respiratory failure with hypoxia and hypercapnia (HCC)   Acute renal failure superimposed on stage 2 chronic kidney disease (HCC)   COPD with acute exacerbation (HCC)   Chronic heart failure with preserved ejection fraction (HFpEF) (HCC)   Hypertension   NSCLC of left lung (HCC)   Type 2 diabetes mellitus (HCC)   Chronic pain syndrome   Hypomagnesemia   Tremor of both hands   Acute respiratory failure with hypoxia and hypercapnia (HCC)   Anxiety   Sinus tachycardia  Resolved Problems:   * No resolved hospital problems. [facility] Central Course: No notes on file  Assessment and Plan: * Sepsis (HCC) Presented in respiratory distress with acute on chronic hypoxic respiratory failure tachycardia, significant leukocytosis, and concern for underlying pneumonia.  Procalcitonin is notably elevated, previously below 0.10.  Pt recently admitted for COPD exacerbation where he received several days of Rocephin.  Due to this, will cover broadly for hospital associated microbes. --Continue IV Levaquin (Transitioned from cefepime/azithromycin to IV levofloxacin given allergic reaction to azithromycin) Respiratory viral panel negative. - Follow blood and sputum cultures pending  Acute on chronic respiratory failure with hypoxia and hypercapnia (HCC) History of chronic hypoxic  respiratory failure with hypercapnia on 4 L at baseline.  Acute worsening of both hypoxia and hypercapnia on admission, potentially due to underlying pneumonia.  Initial concern at SNF for hypervolemia, however he appears euvolemic. --Supplement O2 and wean as tolerated --Target spO2 88 -- 95% --Pt did not tolerate BiPAP due to nausea on admission, use as needed if tolerated --Mgmt of COPD and PNA as outlined --Incentive spirometer & flutter  COPD with acute exacerbation (HCC) Due to potential underlying pneumonia. - S/p Solu-Medrol 125 mg once - Continue high-dose Solu-Medrol pending further clinical improvement - DuoNebs every 6 hours - Pulmicort twice daily  Acute renal failure superimposed on stage 2 chronic kidney disease (HCC) Mild AKI on admission, likely due to diuretic use this week and subsequent hypovolemia. - S/p half liter bolus in ED - Avoid nephrotoxic agents -- Monitor BMP's  Chronic heart failure with preserved ejection fraction (HFpEF) (HCC) History of HFrEF with last EF of > 55%.  He has been on Lasix at SNF.  Appears euvolemic to mildly hypovolemic on admission.   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  May be contributing overall to patient's extremely poor lung function  Type 2 diabetes mellitus (HCC) - Hold home antiglycemic agents - SSI, moderate  Chronic pain syndrome - Continue home regimen  Tremor of both hands Suspect from gabapentin.  On recent d/c summary, pt d/c on 200 mg TID.  Med history this admission shows 400 mg TID, and is  ordered at this dose.   11/9 - pt having severe tremors in both hands, not present yesterday, hospital day 1 Suspect from higher dose gabapentin. --Reduce gabapentin to 200 mg  TID --Monitor for improvement --Appears pt given Klonopin last admission for this, but was not continued on d/c.  Would avoid benzo's if possible.  Hypomagnesemia 2g IV Mg-sulfate ordered for Mg 1.6 today. Monitor & replace Mg PRN      {Tip this will not be part of the note when signed Body mass index is 26.24 kg/m. , ,  (Optional):26781}  {(NOTE) Pain control PDMP Statment (Optional):26782} Consultants: *** Procedures performed: ***  Disposition: {Plan; Disposition:26390} Diet recommendation:  Discharge Diet Orders (From admission, onward)     Start     Ordered   10/22/23 0000  Diet - low sodium heart healthy        10/22/23 1438           {Diet_Plan:26776} DISCHARGE MEDICATION: Allergies as of 10/22/2023       Reactions   Bee Venom Anaphylaxis   Linezolid Other (See Comments)   Thrombocytopenia, hyperlactatemia   Azithromycin Itching, Rash        Medication List     TAKE these medications    acetaminophen 325 MG tablet Commonly known as: TYLENOL Take 650 mg by mouth every 6 (six) hours as needed.   albuterol 108 (90 Base) MCG/ACT inhaler Commonly known as: VENTOLIN HFA Inhale 2 puffs into the lungs every 4 (four) hours as needed for wheezing or shortness of breath.   allopurinol 300 MG tablet Commonly known as: ZYLOPRIM Take 600 mg by mouth daily.   aspirin EC 81 MG tablet Take 1 tablet by mouth daily.   azelastine 0.1 % nasal spray Commonly known as: ASTELIN Place 2 sprays into both nostrils 2 (two) times daily.   bisacodyl 5 MG EC tablet Generic drug: bisacodyl Take 1 tablet (5 mg total) by mouth daily as needed for moderate constipation.   Buprenorphine HCl-Naloxone HCl 8-2 MG Film Place 1 Film under the tongue every 8 (eight) hours for 5 days.   clopidogrel 75 MG tablet Commonly known as: PLAVIX Take 1 tablet (75 mg total) by mouth daily.   cyanocobalamin 1000 MCG/ML injection Commonly known as: VITAMIN B12 Inject 1 mL (1,000 mcg  total) into the muscle every 30 (thirty) days.   Dextromethorphan-guaiFENesin 10-100 MG/5ML liquid Take 5 mLs by mouth every 4 (four) hours as needed (cough).   doxycycline 100 MG tablet Commonly known as: VIBRA-TABS Take 1 tablet (100 mg total) by mouth every 12 (twelve) hours for 3 days.   DULoxetine 60 MG capsule Commonly known as: CYMBALTA Take 60 mg by mouth daily.   Ensure Max Protein Liqd Take 330 mLs (11 oz total) by mouth 2 (two) times daily.   escitalopram 10 MG tablet Commonly known as: LEXAPRO Take 1 tablet (10 mg total) by mouth daily. What changed:  medication strength how much to take   ezetimibe 10 MG tablet Commonly known as: ZETIA Take 10 mg by mouth daily.   fluticasone 50 MCG/ACT nasal spray Commonly known as: FLONASE Place 1 spray into both nostrils daily.   gabapentin 400 MG capsule Commonly known as: NEURONTIN Take 400 mg by mouth 3 (three) times daily.   ipratropium-albuterol 0.5-2.5 (3) MG/3ML Soln Commonly known as: DUONEB Take 3 mLs by nebulization every 6 (six) hours as needed (wheezing).   levalbuterol 0.63 MG/3ML nebulizer solution Commonly known as: XOPENEX Take 3 mLs (0.63  mg total) by nebulization every 6 (six) hours as needed for wheezing or shortness of breath.   LORazepam 0.5 MG tablet Commonly known as: ATIVAN Take 1 tablet (0.5 mg total) by mouth at bedtime as needed for up to 5 days for anxiety.   metFORMIN 1000 MG tablet Commonly known as: GLUCOPHAGE Take 1,000 mg by mouth daily.   metoprolol succinate 50 MG 24 hr tablet Commonly known as: TOPROL-XL Take 50 mg by mouth daily.   MULTIVITAMIN ADULT (MINERALS) PO Take 1 tablet by mouth every morning.   naloxone 4 MG/0.1ML Liqd nasal spray kit Commonly known as: NARCAN Place 1 spray into the nose once.   ondansetron 4 MG disintegrating tablet Commonly known as: ZOFRAN-ODT Take 4 mg by mouth every 8 (eight) hours as needed for nausea.   OXYGEN Inhale 2 L into the  lungs.   pantoprazole 40 MG tablet Commonly known as: PROTONIX Take 40 mg by mouth daily.   predniSONE 10 MG tablet Commonly known as: DELTASONE Take 2 tablets (20 mg total) by mouth daily for 3 days.   PRO-STAT SUGAR FREE PO Take 30 mLs by mouth 2 (two) times daily.   roflumilast 500 MCG Tabs tablet Commonly known as: DALIRESP Take 500 mcg by mouth daily.   simvastatin 40 MG tablet Commonly known as: ZOCOR Take 40 mg by mouth at bedtime.   SYRINGE 3CC/25GX1" 25G X 1" 3 ML Misc 1 Syringe by Does not apply route every 30 (thirty) days.   thiamine 100 MG tablet Commonly known as: VITAMIN B1 Take 1 tablet (100 mg total) by mouth daily.   traZODone 50 MG tablet Commonly known as: DESYREL Take 50 mg by mouth at bedtime.   Trelegy Ellipta 100-62.5-25 MCG/ACT Aepb Generic drug: Fluticasone-Umeclidin-Vilant Inhale 1 puff into the lungs in the morning.        Discharge Exam: Filed Weights   10/16/23 1141  Weight: 76 kg   ***  Condition at discharge: {DC Condition:26389}  The results of significant diagnostics from this hospitalization (including imaging, microbiology, ancillary and laboratory) are listed below for reference.   Imaging Studies: CT CHEST WO CONTRAST  Result Date: 10/16/2023 CLINICAL DATA:  Respiratory illness, nondiagnostic xray Recent pneumonia. Radiologic records indicates history of lung cancer. EXAM: CT CHEST WITHOUT CONTRAST TECHNIQUE: Multidetector CT imaging of the chest was performed following the standard protocol without IV contrast. RADIATION DOSE REDUCTION: This exam was performed according to the departmental dose-optimization program which includes automated exposure control, adjustment of the mA and/or kV according to patient size and/or use of iterative reconstruction technique. COMPARISON:  Radiograph earlier today. Chest CT 10/04/2023, additional priors reviewed. FINDINGS: Cardiovascular: Normal heart size there are coronary artery  calcifications. Dilated main pulmonary artery at 3.8 cm. Aortic atherosclerosis and tortuosity. No aortic aneurysm. Mediastinum/Nodes: Stable mediastinal adenopathy, including anterior paratracheal node measuring 14 mm. Limited hilar assessment in the absence of IV contrast. Decompressed esophagus. Lungs/Pleura: Advanced emphysema. Masslike opacity in the subpleural left upper lobe measures 2.6 x 1.7 cm, series 3, image 45, unchanged from prior exams, typical of treated lung cancer. Right upper lobe nodule has slightly decreased in size, 13 x 10 mm series 3, image 47, previously 17 x 11 mm. There is increasing adjacent ill-defined opacity in the right upper lobe as well as areas of mucoid impaction. Minimal patchy opacity in the perifissural right upper lobe has increased. Dependent opacity in the right lower lobe may represent atelectasis or pneumonia. Punctate subpleural right middle lobe nodules are unchanged.  No significant pleural effusion. Upper Abdomen: No acute findings. Musculoskeletal: Chronic changes of left lateral ribs. Chronic degenerative change in the lower thoracic spine. Unchanged mild T5 compression deformity. No acute osseous findings. IMPRESSION: 1. Increasing ill-defined opacity in the right upper lobe as well as areas of mucoid impaction. Findings suspicious for infection. Dependent opacity in the right lower lobe may represent atelectasis or pneumonia. 2. Decreasing size of right upper lobe pulmonary nodule, favoring a post infectious or inflammatory etiology, however close CT follow-up is recommended to ensure complete resolution. 3. Unchanged masslike opacity in the left upper lobe, typical of treated lung cancer. 4. Stable mediastinal adenopathy. 5. Dilated main pulmonary artery. Aortic Atherosclerosis (ICD10-I70.0) and Emphysema (ICD10-J43.9). Electronically Signed   By: Narda Rutherford M.D.   On: 10/16/2023 23:51   DG Chest Port 1 View  Result Date: 10/16/2023 CLINICAL DATA:   Shortness of breath. EXAM: PORTABLE CHEST 1 VIEW COMPARISON:  Chest radiograph and CT chest dated October 04, 2023. FINDINGS: The heart size and mediastinal contours are unchanged. Aortic atherosclerosis. Similar appearance of bilateral nodular opacities, better evaluated on the prior chest CT dated October 04, 2023. Emphysematous changes. Bibasilar scarring. No pneumothorax or pleural effusion. The visualized skeletal structures are unchanged. IMPRESSION: No significant change from the prior exam. Similar appearance of bilateral nodular opacities, better evaluated on the prior CT chest dated October 04, 2023. Electronically Signed   By: Hart Robinsons M.D.   On: 10/16/2023 14:22   MR HEEL LEFT W WO CONTRAST  Result Date: 10/06/2023 CLINICAL DATA:  Left hindfoot osteomyelitis status post debridement on 07/11/2023 EXAM: MRI OF LOWER LEFT EXTREMITY WITHOUT AND WITH CONTRAST TECHNIQUE: Multiplanar, multisequence MR imaging of the left hindfoot was performed both before and after administration of intravenous contrast. CONTRAST:  7mL GADAVIST GADOBUTROL 1 MMOL/ML IV SOLN COMPARISON:  X-ray 07/02/2023, MRI 06/23/2023 FINDINGS: Technical Note: Despite efforts by the technologist and patient, motion artifact is present on today's exam and could not be eliminated. This reduces exam sensitivity and specificity. Bones/Joint/Cartilage Postsurgical changes from prior partial calcaneal resection with excision of the plantar calcaneal spur. Along the plantar surface of the posterior calcaneus is a area of focal bone marrow edema and enhancement with confluent low T1 marrow signal compatible with acute osteomyelitis (series 5, image 12). No additional sites of osteomyelitis are identified. No tibiotalar or or subtalar joint effusion to suggest septic arthritis. No acute fracture. 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 Large bone infarction again noted in the distal  tibia. Ligaments Lateral ankle ligaments are chronically torn with thickened joint capsule. No evidence to suggest an acute ligamentous injury. Muscles and Tendons Denervation changes of the lower leg and foot musculature. No tenosynovitis. Soft tissues Plantar soft tissue ulceration. Edematous tissue extends to the underlying cortex of the posterior calcaneus. No organized fluid collections. IMPRESSION: 1. Acute osteomyelitis of the posterior calcaneus. Underlying plantar soft tissue ulceration. 2. 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 Electronically Signed   By: Duanne Guess D.O.   On: 10/06/2023 13:33   CT CHEST W CONTRAST  Result Date: 10/04/2023 CLINICAL DATA:  Short of breath, COPD exacerbation, respiratory distress EXAM: CT CHEST WITH CONTRAST TECHNIQUE: Multidetector CT imaging of the chest was performed during intravenous contrast administration. RADIATION DOSE REDUCTION: This exam was performed according to the departmental dose-optimization program which includes automated exposure control, adjustment of the mA and/or kV according to patient size and/or use of iterative reconstruction  technique. CONTRAST:  75mL OMNIPAQUE IOHEXOL 300 MG/ML  SOLN COMPARISON:  08/26/2023, 08/30/2023, 10/04/2023 FINDINGS: Cardiovascular: The heart is unremarkable without pericardial effusion. Dilated main pulmonary arteries consistent with pulmonary arterial hypertension, stable. No evidence of thoracic aortic aneurysm or dissection. Atherosclerosis of the aorta and coronary vasculature again noted. Mediastinum/Nodes: Thyroid, trachea, and esophagus are unremarkable. Stable mediastinal and hilar adenopathy. Index precarinal lymph node measures up to 15 mm reference image 70/2, unchanged since prior exam. Lungs/Pleura: Diffuse emphysema again noted. Stable subpleural masslike density within the left upper lobe measuring 1.6 x 2.0 cm reference image 50/2, likely site of prior  treated lung cancer. Interval enlargement of the left upper lobe nodularity seen on prior exam, now measuring 17 x 11 mm reference image 51/3, previously measuring 7 x 6 mm on the 08/26/2023 exam. The punctate subpleural nodules within the right middle lobe on prior study are unchanged. No new areas of airspace disease, effusion, or pneumothorax. Chronic areas of scarring at the lung bases, most pronounced within the lingula. Central airways are patent. Upper Abdomen: No acute abnormality. Musculoskeletal: No acute or destructive bony abnormalities. Stable mild wedge compression deformity of the T5 vertebral body. Stable spondylosis at the thoracolumbar junction. Reconstructed images demonstrate no additional findings. IMPRESSION: 1. Progressive enlargement of the right upper lobe nodule seen on prior exam, now measuring 17 x 11 mm. While the patient has a history of waxing and waning pulmonary nodularity presumed to be inflammatory or infectious, the persistence since the 08/30/2023 exam is somewhat concerning. PET-CT or interval 3 month follow-up chest CT are recommended for further evaluation. 2. Stable mediastinal and hilar adenopathy, nonspecific. This could also be re-evaluated at the time of follow-up imaging. 3. Stable post therapeutic changes within the left upper lobe, consistent with history of treated lung cancer. 4. Aortic Atherosclerosis (ICD10-I70.0) and Emphysema (ICD10-J43.9). 5. Stable enlarged pulmonary arteries consistent with pulmonary arterial hypertension. Electronically Signed   By: Sharlet Salina M.D.   On: 10/04/2023 21:11   DG Chest Portable 1 View  Result Date: 10/04/2023 CLINICAL DATA:  SOB EXAM: PORTABLE CHEST 1 VIEW COMPARISON:  August 30, 2023 FINDINGS: The cardiomediastinal silhouette is unchanged in contour.Atherosclerotic calcifications. No pleural effusion. No pneumothorax. Similar appearance of bilateral nodular opacities, better evaluated on recent chest CT. RIGHT-sided  predominant bronchitic markings and mild bronchial wall cuffing. Emphysematous changes. IMPRESSION: 1. RIGHT-sided predominant bronchitic markings and mild bronchial wall cuffing. This could reflect bronchitis. 2. Similar appearance of bilateral nodular opacities, better evaluated on recent chest CT. Electronically Signed   By: Meda Klinefelter M.D.   On: 10/04/2023 17:31    Microbiology: Results for orders placed or performed during the hospital encounter of 10/16/23  Resp panel by RT-PCR (RSV, Flu A&B, Covid) Anterior Nasal Swab     Status: None   Collection Time: 10/16/23  1:31 PM   Specimen: Anterior Nasal Swab  Result Value Ref Range Status   SARS Coronavirus 2 by RT PCR NEGATIVE NEGATIVE Final    Comment: (NOTE) SARS-CoV-2 target nucleic acids are NOT DETECTED.  The SARS-CoV-2 RNA is generally detectable in upper respiratory specimens during the acute phase of infection. The lowest concentration of SARS-CoV-2 viral copies this assay can detect is 138 copies/mL. A negative result does not preclude SARS-Cov-2 infection and should not be used as the sole basis for treatment or other patient management decisions. A negative result may occur with  improper specimen collection/handling, submission of specimen other than nasopharyngeal swab, presence of viral mutation(s) within the  areas targeted by this assay, and inadequate number of viral copies(<138 copies/mL). A negative result must be combined with clinical observations, patient history, and epidemiological information. The expected result is Negative.  Fact Sheet for Patients:  BloggerCourse.com  Fact Sheet for Healthcare Providers:  SeriousBroker.it  This test is no t yet approved or cleared by the [place] FDA and  has been authorized for detection and/or diagnosis of SARS-CoV-2 by FDA under an Emergency Use Authorization (EUA). This EUA will remain  in effect (meaning  this test can be used) for the duration of the COVID-19 declaration under Section 564(b)(1) of the Act, 21 U.S.C.section 360bbb-3(b)(1), unless the authorization is terminated  or revoked sooner.       Influenza A by PCR NEGATIVE NEGATIVE Final   Influenza B by PCR NEGATIVE NEGATIVE Final    Comment: (NOTE) The Xpert Xpress SARS-CoV-2/FLU/RSV plus assay is intended as an aid in the diagnosis of influenza from Nasopharyngeal swab specimens and should not be used as a sole basis for treatment. Nasal washings and aspirates are unacceptable for Xpert Xpress SARS-CoV-2/FLU/RSV testing.  Fact Sheet for Patients: BloggerCourse.com  Fact Sheet for Healthcare Providers: SeriousBroker.it  This test is not yet approved or cleared by the [place] FDA and has been authorized for detection and/or diagnosis of SARS-CoV-2 by FDA under an Emergency Use Authorization (EUA). This EUA will remain in effect (meaning this test can be used) for the duration of the COVID-19 declaration under Section 564(b)(1) of the Act, 21 U.S.C. section 360bbb-3(b)(1), unless the authorization is terminated or revoked.     Resp Syncytial Virus by PCR NEGATIVE NEGATIVE Final    Comment: (NOTE) Fact Sheet for Patients: BloggerCourse.com  Fact Sheet for Healthcare Providers: SeriousBroker.it  This test is not yet approved or cleared by the [place] FDA and has been authorized for detection and/or diagnosis of SARS-CoV-2 by FDA under an Emergency Use Authorization (EUA). This EUA will remain in effect (meaning this test can be used) for the duration of the COVID-19 declaration under Section 564(b)(1) of the Act, 21 U.S.C. section 360bbb-3(b)(1), unless the authorization is terminated or revoked.  Performed at [facility], [address] [place], [place] [address]   Blood Culture (routine x  2)     Status: None   Collection Time: 10/16/23  1:31 PM   Specimen: BLOOD  Result Value Ref Range Status   Specimen Description BLOOD RIGHT ANTECUBITAL  Final   Special Requests   Final    BOTTLES DRAWN AEROBIC AND ANAEROBIC Blood Culture adequate volume   Culture   Final    NO GROWTH 5 DAYS Performed at [facility], [address] [place], [place] [address]    Report Status 10/21/2023 FINAL  Final  Blood Culture (routine x 2)     Status: None   Collection Time: 10/16/23  1:31 PM   Specimen: BLOOD  Result Value Ref Range Status   Specimen Description BLOOD BLOOD RIGHT FOREARM  Final   Special Requests   Final    BOTTLES DRAWN AEROBIC AND ANAEROBIC Blood Culture results may not be optimal due to an excessive volume of blood received in culture bottles   Culture   Final    NO GROWTH 5 DAYS Performed at [facility], [address], [place], [place] [address]    Report Status 10/21/2023 FINAL  Final  Respiratory (~20 pathogens) panel by PCR     Status: None   Collection Time: 10/16/23  2:57  PM   Specimen: Nasopharyngeal Swab; Respiratory  Result Value Ref Range Status   Adenovirus NOT DETECTED NOT DETECTED Final   Coronavirus 229E NOT DETECTED NOT DETECTED Final    Comment: (NOTE) The Coronavirus on the Respiratory Panel, DOES NOT test for the novel  Coronavirus (2019 nCoV)    Coronavirus HKU1 NOT DETECTED NOT DETECTED Final   Coronavirus NL63 NOT DETECTED NOT DETECTED Final   Coronavirus OC43 NOT DETECTED NOT DETECTED Final   Metapneumovirus NOT DETECTED NOT DETECTED Final   Rhinovirus / Enterovirus NOT DETECTED NOT DETECTED Final   Influenza A NOT DETECTED NOT DETECTED Final   Influenza B NOT DETECTED NOT DETECTED Final   Parainfluenza Virus 1 NOT DETECTED NOT DETECTED Final   Parainfluenza Virus 2 NOT DETECTED NOT DETECTED Final   Parainfluenza Virus 3 NOT DETECTED NOT DETECTED Final   Parainfluenza Virus 4 NOT DETECTED NOT DETECTED Final    Respiratory Syncytial Virus NOT DETECTED NOT DETECTED Final   Bordetella pertussis NOT DETECTED NOT DETECTED Final   Bordetella Parapertussis NOT DETECTED NOT DETECTED Final   Chlamydophila pneumoniae NOT DETECTED NOT DETECTED Final   Mycoplasma pneumoniae NOT DETECTED NOT DETECTED Final    Comment: Performed at [facility], [address] [place], [place] [address]  Expectorated Sputum Assessment w Gram Stain, Rflx to Resp Cult     Status: None   Collection Time: 10/16/23  3:02 PM   Specimen: Sputum  Result Value Ref Range Status   Specimen Description SPUTUM  Final   Special Requests EXPSU  Final   Sputum evaluation   Final    THIS SPECIMEN IS ACCEPTABLE FOR SPUTUM CULTURE Performed at [facility], [address] [place], [place] [address]    Report Status 10/16/2023 FINAL  Final  Culture, Respiratory w Gram Stain     Status: None   Collection Time: 10/16/23  3:02 PM   Specimen: SPU  Result Value Ref Range Status   Specimen Description   Final    SPUTUM Performed at [facility], [address], [place], [place] [address]    Special Requests   Final    EXPSU Reflexed from [id number] Performed at [facility], [address] [place], [place] [address]    Gram Stain   Final    ABUNDANT WBC PRESENT, PREDOMINANTLY PMN RARE GRAM POSITIVE COCCI RARE GRAM POSITIVE RODS RARE BUDDING YEAST SEEN Performed at [facility], [address], [place], [place] [address]    Culture FEW METHICILLIN RESISTANT STAPHYLOCOCCUS AUREUS  Final   Report Status 10/19/2023 FINAL  Final   Organism ID, Bacteria METHICILLIN RESISTANT STAPHYLOCOCCUS AUREUS  Final      Susceptibility   Methicillin resistant staphylococcus aureus - MIC*    CIPROFLOXACIN >=8 RESISTANT Resistant     ERYTHROMYCIN >=8 RESISTANT Resistant     GENTAMICIN <=0.5 SENSITIVE Sensitive     OXACILLIN >=4 RESISTANT Resistant     TETRACYCLINE <=1 SENSITIVE Sensitive     VANCOMYCIN <=0.5  SENSITIVE Sensitive     TRIMETH/SULFA <=10 SENSITIVE Sensitive     CLINDAMYCIN <=0.25 SENSITIVE Sensitive     RIFAMPIN <=0.5 SENSITIVE Sensitive     Inducible Clindamycin NEGATIVE Sensitive     LINEZOLID 2 SENSITIVE Sensitive     * FEW METHICILLIN RESISTANT STAPHYLOCOCCUS AUREUS  MRSA Next Gen by PCR, Nasal     Status: Abnormal   Collection Time: 10/18/23  5:28 PM   Specimen: Nasal Mucosa; Nasal Swab  Result Value  Ref Range Status   MRSA by PCR Next Gen DETECTED (A) NOT DETECTED Final    Comment: RESULT CALLED TO, READ BACK BY AND VERIFIED WITH:  Adella Hare AT 2117 10/18/23 JG (NOTE) The GeneXpert MRSA Assay (FDA approved for NASAL specimens only), is one component of a comprehensive MRSA colonization surveillance program. It is not intended to diagnose MRSA infection nor to guide or monitor treatment for MRSA infections. Test performance is not FDA approved in patients less than 28 years old. Performed at [facility], [address] [place], [place] [address]     Labs: CBC: Recent Labs  Lab 10/16/23 1216 10/17/23 0451 10/18/23 0452 10/19/23 0549  WBC 35.9* 16.2* 17.0* 18.2*  NEUTROABS  --  15.5*  --   --   HGB 10.2* 8.6* 7.9* 8.9*  HCT 34.2* 29.2* 26.7* 29.2*  MCV 92.7 95.4 94.0 89.8  PLT 247 186 208 242   Basic Metabolic Panel: Recent Labs  Lab 10/16/23 1216 10/17/23 0451 10/18/23 0452 10/19/23 0549  NA 133* 139 139 137  K 4.0 4.9 4.3 3.9  CL 89* 98 96* 96*  CO2 34* 31 35* 32  GLUCOSE 135* 202* 197* 149*  BUN 25* 32* 32* 32*  CREATININE 1.44* 1.49* 1.36* 1.28*  CALCIUM 8.4* 8.7* 8.1* 8.7*  MG  --   --  1.6* 1.8   Liver Function Tests: Recent Labs  Lab 10/17/23 0451  AST 12*  ALT 12  ALKPHOS 68  BILITOT 0.3  PROT 6.4*  ALBUMIN 2.7*   CBG: Recent Labs  Lab 10/21/23 0822 10/21/23 1210 10/21/23 1633 10/22/23 0756 10/22/23 1213  GLUCAP 111* 124* 210* 170* 105*    Discharge time spent: {LESS THAN/GREATER THAN:26388} 30  minutes.  Signed: Pennie Banter, DO [facility] 10/22/2023

## 2023-10-22 NOTE — Plan of Care (Signed)
Progressing towards goals

## 2023-10-22 NOTE — TOC Transition Note (Signed)
Transition of Care [facility]) - CM/SW Discharge Note   Patient Details  Name: Christian Fields MRN: [id number] Date of Birth: Feb 23, 1956  [facility] CM/SW Contact:  Darolyn Rua, LCSW Phone Number: 10/22/2023, 2:41 PM   Clinical Narrative:     Patient has decided to discharge home today as his insurance authorization has not approved for return to [facility] yet and he would like to go home.   [facility] PT referral sent to [facility] with [facility] , she has accepted.   No other discharge needs.   Final next level of care: Home w Home Health Services Barriers to Discharge: Continued Medical Work up   Patient Goals and CMS Choice CMS Medicare.gov Compare Post Acute Care list provided to:: Patient Choice offered to / list presented to : Patient  Discharge Placement                      Patient and family notified of of transfer: 10/22/23  Discharge Plan and Services Additional resources added to the After Visit Summary for   In-house Referral: Clinical Social Work                                   Social Determinants of Health (SDOH) Interventions SDOH Screenings   Food Insecurity: No Food Insecurity (10/19/2023)  Housing: Low Risk  (10/19/2023)  Transportation Needs: No Transportation Needs (10/19/2023)  Utilities: Not At Risk (10/19/2023)  Financial Resource Strain: Low Risk  (04/19/2023)   Received from [facility], [facility]  Tobacco Use: Medium Risk (10/19/2023)     Readmission Risk Interventions    10/09/2023   11:34 AM 10/07/2023    1:48 PM 09/03/2023    2:13 PM  Readmission Risk Prevention Plan  Transportation Screening  Complete Complete  Medication Review Oceanographer)  Complete Complete  PCP or Specialist appointment within 3-5 days of discharge   Complete  HRI or Home Care Consult  Complete Complete  SW Recovery Care/Counseling Consult Complete  Complete  Palliative Care Screening  Not Applicable Not Applicable  Skilled  Nursing Facility  Complete Not Applicable

## 2023-12-11 ENCOUNTER — Encounter: Payer: Self-pay | Admitting: Oncology

## 2023-12-11 NOTE — Telephone Encounter (Signed)
 Error

## 2023-12-30 ENCOUNTER — Encounter: Payer: Self-pay | Admitting: Oncology

## 2024-01-06 ENCOUNTER — Inpatient Hospital Stay: Payer: 59 | Attending: Oncology | Admitting: Oncology

## 2024-01-28 ENCOUNTER — Encounter: Payer: Self-pay | Admitting: Oncology

## 2024-01-29 ENCOUNTER — Encounter: Payer: Self-pay | Admitting: Oncology

## 2024-02-02 ENCOUNTER — Encounter: Payer: Self-pay | Admitting: Oncology

## 2024-02-04 ENCOUNTER — Encounter: Payer: Self-pay | Admitting: Oncology

## 2024-02-04 ENCOUNTER — Inpatient Hospital Stay: Payer: 59 | Attending: Oncology | Admitting: Oncology

## 2024-02-04 ENCOUNTER — Other Ambulatory Visit: Payer: Self-pay

## 2024-02-04 ENCOUNTER — Inpatient Hospital Stay: Payer: 59

## 2024-02-04 VITALS — BP 120/82 | HR 89 | Temp 98.0°F | Resp 19 | Wt 175.9 lb

## 2024-02-04 DIAGNOSIS — Z08 Encounter for follow-up examination after completed treatment for malignant neoplasm: Secondary | ICD-10-CM

## 2024-02-04 DIAGNOSIS — D508 Other iron deficiency anemias: Secondary | ICD-10-CM

## 2024-02-04 DIAGNOSIS — C3412 Malignant neoplasm of upper lobe, left bronchus or lung: Secondary | ICD-10-CM

## 2024-02-04 DIAGNOSIS — Z87891 Personal history of nicotine dependence: Secondary | ICD-10-CM | POA: Diagnosis not present

## 2024-02-04 DIAGNOSIS — Z923 Personal history of irradiation: Secondary | ICD-10-CM | POA: Insufficient documentation

## 2024-02-04 DIAGNOSIS — D509 Iron deficiency anemia, unspecified: Secondary | ICD-10-CM | POA: Diagnosis present

## 2024-02-04 DIAGNOSIS — Z85118 Personal history of other malignant neoplasm of bronchus and lung: Secondary | ICD-10-CM | POA: Insufficient documentation

## 2024-02-04 DIAGNOSIS — E875 Hyperkalemia: Secondary | ICD-10-CM

## 2024-02-04 LAB — CBC (CANCER CENTER ONLY)
HCT: 33.3 % — ABNORMAL LOW (ref 39.0–52.0)
Hemoglobin: 10 g/dL — ABNORMAL LOW (ref 13.0–17.0)
MCH: 26.5 pg (ref 26.0–34.0)
MCHC: 30 g/dL (ref 30.0–36.0)
MCV: 88.1 fL (ref 80.0–100.0)
Platelet Count: 241 10*3/uL (ref 150–400)
RBC: 3.78 MIL/uL — ABNORMAL LOW (ref 4.22–5.81)
RDW: 16.2 % — ABNORMAL HIGH (ref 11.5–15.5)
WBC Count: 10.7 10*3/uL — ABNORMAL HIGH (ref 4.0–10.5)
nRBC: 0 % (ref 0.0–0.2)

## 2024-02-04 LAB — VITAMIN B12: Vitamin B-12: 744 pg/mL (ref 180–914)

## 2024-02-04 LAB — IRON AND TIBC
Iron: 28 ug/dL — ABNORMAL LOW (ref 45–182)
Saturation Ratios: 7 % — ABNORMAL LOW (ref 17.9–39.5)
TIBC: 388 ug/dL (ref 250–450)
UIBC: 360 ug/dL

## 2024-02-04 LAB — FERRITIN: Ferritin: 30 ng/mL (ref 24–336)

## 2024-02-04 LAB — FOLATE: Folate: >40 ng/mL (ref >5.9)

## 2024-02-05 ENCOUNTER — Encounter (INDEPENDENT_AMBULATORY_CARE_PROVIDER_SITE_OTHER): Payer: Self-pay | Admitting: Nurse Practitioner

## 2024-02-05 ENCOUNTER — Ambulatory Visit (INDEPENDENT_AMBULATORY_CARE_PROVIDER_SITE_OTHER): Payer: 59

## 2024-02-05 ENCOUNTER — Ambulatory Visit (INDEPENDENT_AMBULATORY_CARE_PROVIDER_SITE_OTHER): Payer: 59 | Admitting: Nurse Practitioner

## 2024-02-05 VITALS — BP 115/70 | HR 84 | Resp 16 | Wt 175.0 lb

## 2024-02-05 DIAGNOSIS — I714 Abdominal aortic aneurysm, without rupture, unspecified: Secondary | ICD-10-CM

## 2024-02-05 DIAGNOSIS — Z9889 Other specified postprocedural states: Secondary | ICD-10-CM | POA: Diagnosis not present

## 2024-02-05 DIAGNOSIS — J9622 Acute and chronic respiratory failure with hypercapnia: Secondary | ICD-10-CM

## 2024-02-05 DIAGNOSIS — J9621 Acute and chronic respiratory failure with hypoxia: Secondary | ICD-10-CM | POA: Diagnosis not present

## 2024-02-05 DIAGNOSIS — I739 Peripheral vascular disease, unspecified: Secondary | ICD-10-CM

## 2024-02-05 DIAGNOSIS — I10 Essential (primary) hypertension: Secondary | ICD-10-CM | POA: Diagnosis not present

## 2024-02-05 NOTE — Progress Notes (Signed)
 Subjective:    Patient ID: Christian Fields, male    DOB: 21-May-1956, 68 y.o.   MRN: [id number] Chief Complaint  Patient presents with   New Patient (Initial Visit)    Ref Ether Griffins consult h/o AAA    Christian Fields is a 68 year old male who returns today for noninvasive studies for follow-up of his abdominal aortic aneurysm and peripheral arterial disease.  His most recent intervention for his lower extremities was on 07/03/2023.  Since then he denies any claudication-like symptoms.  He denies any rest pain or the development of any open wounds or ulcerations.  He previously had a left heel ulceration which is currently healed.  He was actually recently discharged from the hospital due to acute respiratory failure and presents today with home O2.  Today noninvasive studies show an abdominal aortic aneurysm with a large diameter of 3.9 cm.  His most recent evaluation was in 2022 via CT that showed it at 3.8 cm.  Additionally he underwent ABIs today which show an ABI of 01.17 on the right and 1.08 on the left.  He has strong triphasic tibial artery waveforms bilaterally with good toe waveforms bilaterally.    Review of Systems  Neurological:  Positive for weakness.  All other systems reviewed and are negative.      Objective:   Physical Exam Vitals reviewed.  Constitutional:      Appearance: He is normal weight.  HENT:     Head: Normocephalic.  Cardiovascular:     Rate and Rhythm: Normal rate.     Pulses:          Dorsalis pedis pulses are detected w/ Doppler on the right side and detected w/ Doppler on the left side.       Posterior tibial pulses are detected w/ Doppler on the right side and detected w/ Doppler on the left side.  Pulmonary:     Effort: Pulmonary effort is normal.     Comments: Using home O2 Skin:    General: Skin is warm and dry.  Neurological:     Mental Status: He is oriented to person, place, and time.     Motor: Weakness present.  Psychiatric:        Mood and  Affect: Mood normal.        Behavior: Behavior normal.        Thought Content: Thought content normal.        Judgment: Judgment normal.     BP 115/70   Pulse 84   Resp 16   Wt 175 lb (79.4 kg)   BMI 27.41 kg/m   Past Medical History:  Diagnosis Date   Anemia    Anxiety    Arthritis    Asthma    Cancer (HCC)    Basal Cell Skin Cancer   Chronic back pain    COPD (chronic obstructive pulmonary disease) (HCC)    Depression    Diabetes mellitus (HCC)    Dyspnea    GERD (gastroesophageal reflux disease)    Gout    Gout    Headache    History of blood clots    Left Leg--July 2018   History of kidney stones    Hyperlipidemia    Hyperlipidemia    Hypertension    Kidney stones    Neuropathy    On home oxygen therapy    2 L / M   Pneumonia 06/2017   Sleep apnea    Ulcer of foot (HCC)  Right    Social History   Socioeconomic History   Marital status: Widowed    Spouse name: Not on file   Number of children: Not on file   Years of education: Not on file   Highest education level: Not on file  Occupational History   Not on file  Tobacco Use   Smoking status: Former    Current packs/day: 0.50    Average packs/day: 0.5 packs/day for 7.2 years (3.6 ttl pk-yrs)    Types: Cigarettes    Start date: 2018   Smokeless tobacco: Never  Vaping Use   Vaping status: Never Used  Substance and Sexual Activity   Alcohol use: Yes    Alcohol/week: 1.0 standard drink of alcohol    Types: 1 Cans of beer per week    Comment: occ   Drug use: No   Sexual activity: Not Currently  Other Topics Concern   Not on file  Social History Narrative   Not on file   Social Drivers of Health   Financial Resource Strain: Low Risk  (11/11/2023)   Received from [facility]   Overall Financial Resource Strain (CARDIA)    Difficulty of Paying Living Expenses: Not hard at all  Food Insecurity: No Food Insecurity (11/11/2023)   Received from [facility]    Hunger Vital Sign    Worried About Running Out of Food in the Last Year: Never true    Ran Out of Food in the Last Year: Never true  Transportation Needs: No Transportation Needs (11/11/2023)   Received from [facility] - Transportation    In the past 12 months, has lack of transportation kept you from medical appointments or from getting medications?: No    Lack of Transportation (Non-Medical): No  Physical Activity: Not on file  Stress: Not on file  Social Connections: Not on file  Intimate Partner Violence: Not At Risk (10/19/2023)   Humiliation, Afraid, Rape, and Kick questionnaire    Fear of Current or Ex-Partner: No    Emotionally Abused: No    Physically Abused: No    Sexually Abused: No    Past Surgical History:  Procedure Laterality Date   APPENDECTOMY     DG FEET 2 VIEWS BILAT     IRRIGATION AND DEBRIDEMENT FOOT Left 07/11/2023   Procedure: IRRIGATION AND DEBRIDEMENT FOOT;  Surgeon: Gwyneth Revels, DPM;  Location: [facility] ORS;  Service: Orthopedics/Podiatry;  Laterality: Left;   LIPOMA EXCISION Right 08/15/2017   Procedure: EXCISION TUMOR(CYST) FOOT;  Surgeon: Recardo Evangelist, DPM;  Location: [facility] ORS;  Service: Podiatry;  Laterality: Right;   LOWER EXTREMITY ANGIOGRAPHY Left 07/03/2023   Procedure: Lower Extremity Angiography;  Surgeon: Annice Needy, MD;  Location: [facility] INVASIVE CV LAB;  Service: Cardiovascular;  Laterality: Left;   OTHER SURGICAL HISTORY Bilateral Foot surgery    Family History  Problem Relation Age of Onset   Hypertension Mother     Allergies  Allergen Reactions   Bee Venom Anaphylaxis   Linezolid Other (See Comments)    Thrombocytopenia, hyperlactatemia   Azithromycin Itching and Rash       Latest Ref Rng & Units 02/04/2024    3:46 PM 10/19/2023    5:49 AM 10/18/2023    4:52 AM  CBC  WBC 4.0 - 10.5 K/uL 10.7  18.2  17.0   Hemoglobin 13.0 - 17.0 g/dL 81.1  8.9  7.9   Hematocrit 39.0 - 52.0 % 33.3  29.2  26.7    Platelets 150 - 400 K/uL 241  242  208       CMP     Component Value Date/Time   NA 137 10/19/2023 0549   NA 136 03/31/2014 1815   K 3.9 10/19/2023 0549   K 3.5 05/17/2014 1424   CL 96 (L) 10/19/2023 0549   CL 100 03/31/2014 1815   CO2 32 10/19/2023 0549   CO2 30 03/31/2014 1815   GLUCOSE 149 (H) 10/19/2023 0549   GLUCOSE 91 05/17/2014 1424   BUN 32 (H) 10/19/2023 0549   BUN 13 03/31/2014 1815   CREATININE 1.28 (H) 10/19/2023 0549   CREATININE 1.05 03/31/2014 1815   CALCIUM 8.7 (L) 10/19/2023 0549   CALCIUM 9.3 03/31/2014 1815   PROT 6.4 (L) 10/17/2023 0451   PROT 7.1 09/24/2013 1918   ALBUMIN 2.7 (L) 10/17/2023 0451   ALBUMIN 3.6 09/24/2013 1918   AST 12 (L) 10/17/2023 0451   AST 38 (H) 09/24/2013 1918   ALT 12 10/17/2023 0451   ALT 36 09/24/2013 1918   ALKPHOS 68 10/17/2023 0451   ALKPHOS 128 09/24/2013 1918   BILITOT 0.3 10/17/2023 0451   BILITOT 0.4 09/24/2013 1918   GFRNONAA >60 10/19/2023 0549   GFRNONAA >60 03/31/2014 1815     No results found.     Assessment & Plan:   1. Abdominal aortic aneurysm (AAA) without rupture, unspecified part (HCC) (Primary) Recommend: No surgery or intervention is indicated at this time.  The patient has an asymptomatic abdominal aortic aneurysm that is less than 4 cm in maximal diameter.    I have reviewed the natural history of abdominal aortic aneurysm and the small risk of rupture for aneurysm less than 5 cm in size.  However, as these small aneurysms tend to enlarge over time, continued surveillance with ultrasound or CT scan is mandatory.   I have also discussed optimizing medical management with hypertension and lipid control and the negative effect that any tobacco products have on aneurysmal disease.  The patient is also encouraged to exercise a minimum of 30 minutes 4 times a week.   Should the patient develop new onset abdominal or back pain or signs of peripheral embolization they are instructed to seek  medical attention immediately and to alert the physician providing care that they have an aneurysm.   The patient voices their understanding.  The patient will return in 12 months with an aortic duplex.  2. Peripheral arterial disease with history of revascularization (HCC)  Recommend: The patient does not voice lifestyle limiting changes at this point in time.  Noninvasive studies do not suggest clinically significant change.  No invasive studies, angiography or surgery at this time The patient should continue walking and begin a more formal exercise program.  The patient should continue antiplatelet therapy and aggressive treatment of the lipid abnormalities  No changes in the patient's medications at this time  Continued surveillance is indicated as atherosclerosis is likely to progress with time.    The patient will continue follow up with noninvasive studies as ordered.   3. Primary hypertension Continue antihypertensive medications as already ordered, these medications have been reviewed and there are no changes at this time.  4. Acute on chronic respiratory failure with hypoxia and hypercapnia (HCC) The patient has significant respiratory issues after his most recent hospitalization.  This may need to be a consideration if he requires intervention.   Current Outpatient Medications on File Prior to Visit  Medication Sig Dispense Refill  acetaminophen (TYLENOL) 325 MG tablet Take 650 mg by mouth every 6 (six) hours as needed.     albuterol (VENTOLIN HFA) 108 (90 Base) MCG/ACT inhaler Inhale 2 puffs into the lungs every 4 (four) hours as needed for wheezing or shortness of breath.     allopurinol (ZYLOPRIM) 300 MG tablet Take 600 mg by mouth daily.     Amino Acids-Protein Hydrolys (PRO-STAT SUGAR FREE PO) Take 30 mLs by mouth 2 (two) times daily.     aspirin EC 81 MG tablet Take 1 tablet by mouth daily.     azelastine (ASTELIN) 0.1 % nasal spray Place 2 sprays into both  nostrils 2 (two) times daily.     bisacodyl (DULCOLAX) 5 MG EC tablet Take 1 tablet (5 mg total) by mouth daily as needed for moderate constipation. 25 tablet 0   clopidogrel (PLAVIX) 75 MG tablet Take 1 tablet (75 mg total) by mouth daily. 30 tablet 1   cyanocobalamin (VITAMIN B12) 1000 MCG/ML injection Inject 1 mL (1,000 mcg total) into the muscle every 30 (thirty) days. 1 mL 11   Dextromethorphan-guaiFENesin 10-100 MG/5ML liquid Take 5 mLs by mouth every 4 (four) hours as needed (cough).     Ensure Max Protein (ENSURE MAX PROTEIN) LIQD Take 330 mLs (11 oz total) by mouth 2 (two) times daily. 19800 mL 2   escitalopram (LEXAPRO) 10 MG tablet Take 1 tablet (10 mg total) by mouth daily. 30 tablet 0   ezetimibe (ZETIA) 10 MG tablet Take 10 mg by mouth daily.     fluticasone (FLONASE) 50 MCG/ACT nasal spray Place 1 spray into both nostrils daily.     Fluticasone-Umeclidin-Vilant (TRELEGY ELLIPTA) 100-62.5-25 MCG/ACT AEPB Inhale 1 puff into the lungs in the morning.     ipratropium-albuterol (DUONEB) 0.5-2.5 (3) MG/3ML SOLN Take 3 mLs by nebulization every 6 (six) hours as needed (wheezing).     levalbuterol (XOPENEX) 0.63 MG/3ML nebulizer solution Take 3 mLs (0.63 mg total) by nebulization every 6 (six) hours as needed for wheezing or shortness of breath. 3 mL 12   metFORMIN (GLUCOPHAGE) 1000 MG tablet Take 1,000 mg by mouth daily.     metoprolol succinate (TOPROL-XL) 50 MG 24 hr tablet Take 50 mg by mouth daily.     Multiple Vitamins-Minerals (MULTIVITAMIN ADULT, MINERALS, PO) Take 1 tablet by mouth every morning.     naloxone (NARCAN) nasal spray 4 mg/0.1 mL Place 1 spray into the nose once.     ondansetron (ZOFRAN-ODT) 4 MG disintegrating tablet Take 4 mg by mouth every 8 (eight) hours as needed for nausea.     OXYGEN Inhale 2 L into the lungs.     pantoprazole (PROTONIX) 40 MG tablet Take 40 mg by mouth daily.     roflumilast (DALIRESP) 500 MCG TABS tablet Take 500 mcg by mouth daily.      simvastatin (ZOCOR) 40 MG tablet Take 40 mg by mouth at bedtime.     Syringe/Needle, Disp, (SYRINGE 3CC/25GX1") 25G X 1" 3 ML MISC 1 Syringe by Does not apply route every 30 (thirty) days. 12 each 0   thiamine (VITAMIN B-1) 100 MG tablet Take 1 tablet (100 mg total) by mouth daily. 30 tablet 2   DULoxetine (CYMBALTA) 60 MG capsule Take 60 mg by mouth daily.     gabapentin (NEURONTIN) 400 MG capsule Take 400 mg by mouth 3 (three) times daily.     No current facility-administered medications on file prior to visit.    There are no  Patient Instructions on file for this visit. No follow-ups on file.   Georgiana Spinner, NP

## 2024-02-06 ENCOUNTER — Encounter: Payer: Self-pay | Admitting: Oncology

## 2024-02-06 ENCOUNTER — Inpatient Hospital Stay
Admission: EM | Admit: 2024-02-06 | Discharge: 2024-02-09 | DRG: 190 | Disposition: A | Discharge status: Home / Self Care | Payer: 59 | Attending: Student in an Organized Health Care Education/Training Program | Admitting: Student in an Organized Health Care Education/Training Program

## 2024-02-06 ENCOUNTER — Other Ambulatory Visit: Payer: Self-pay

## 2024-02-06 ENCOUNTER — Emergency Department: Payer: 59

## 2024-02-06 ENCOUNTER — Encounter (INDEPENDENT_AMBULATORY_CARE_PROVIDER_SITE_OTHER): Payer: Self-pay | Admitting: Nurse Practitioner

## 2024-02-06 DIAGNOSIS — Z7984 Long term (current) use of oral hypoglycemic drugs: Secondary | ICD-10-CM

## 2024-02-06 DIAGNOSIS — I5032 Chronic diastolic (congestive) heart failure: Secondary | ICD-10-CM | POA: Diagnosis present

## 2024-02-06 DIAGNOSIS — I10 Essential (primary) hypertension: Secondary | ICD-10-CM | POA: Diagnosis not present

## 2024-02-06 DIAGNOSIS — I130 Hypertensive heart and chronic kidney disease with heart failure and stage 1 through stage 4 chronic kidney disease, or unspecified chronic kidney disease: Secondary | ICD-10-CM | POA: Diagnosis present

## 2024-02-06 DIAGNOSIS — Z86718 Personal history of other venous thrombosis and embolism: Secondary | ICD-10-CM

## 2024-02-06 DIAGNOSIS — F32A Depression, unspecified: Secondary | ICD-10-CM | POA: Diagnosis present

## 2024-02-06 DIAGNOSIS — E785 Hyperlipidemia, unspecified: Secondary | ICD-10-CM | POA: Diagnosis present

## 2024-02-06 DIAGNOSIS — G4733 Obstructive sleep apnea (adult) (pediatric): Secondary | ICD-10-CM | POA: Diagnosis present

## 2024-02-06 DIAGNOSIS — E1142 Type 2 diabetes mellitus with diabetic polyneuropathy: Secondary | ICD-10-CM | POA: Diagnosis present

## 2024-02-06 DIAGNOSIS — M109 Gout, unspecified: Secondary | ICD-10-CM | POA: Diagnosis present

## 2024-02-06 DIAGNOSIS — F419 Anxiety disorder, unspecified: Secondary | ICD-10-CM | POA: Diagnosis present

## 2024-02-06 DIAGNOSIS — R911 Solitary pulmonary nodule: Secondary | ICD-10-CM | POA: Diagnosis present

## 2024-02-06 DIAGNOSIS — G894 Chronic pain syndrome: Secondary | ICD-10-CM | POA: Diagnosis present

## 2024-02-06 DIAGNOSIS — N182 Chronic kidney disease, stage 2 (mild): Secondary | ICD-10-CM | POA: Diagnosis present

## 2024-02-06 DIAGNOSIS — J9621 Acute and chronic respiratory failure with hypoxia: Secondary | ICD-10-CM | POA: Diagnosis present

## 2024-02-06 DIAGNOSIS — N179 Acute kidney failure, unspecified: Secondary | ICD-10-CM | POA: Diagnosis not present

## 2024-02-06 DIAGNOSIS — Z79899 Other long term (current) drug therapy: Secondary | ICD-10-CM

## 2024-02-06 DIAGNOSIS — E11621 Type 2 diabetes mellitus with foot ulcer: Secondary | ICD-10-CM | POA: Diagnosis present

## 2024-02-06 DIAGNOSIS — E663 Overweight: Secondary | ICD-10-CM | POA: Diagnosis not present

## 2024-02-06 DIAGNOSIS — Z87891 Personal history of nicotine dependence: Secondary | ICD-10-CM

## 2024-02-06 DIAGNOSIS — Z7902 Long term (current) use of antithrombotics/antiplatelets: Secondary | ICD-10-CM

## 2024-02-06 DIAGNOSIS — B965 Pseudomonas (aeruginosa) (mallei) (pseudomallei) as the cause of diseases classified elsewhere: Secondary | ICD-10-CM | POA: Diagnosis present

## 2024-02-06 DIAGNOSIS — Z8673 Personal history of transient ischemic attack (TIA), and cerebral infarction without residual deficits: Secondary | ICD-10-CM

## 2024-02-06 DIAGNOSIS — Z85828 Personal history of other malignant neoplasm of skin: Secondary | ICD-10-CM

## 2024-02-06 DIAGNOSIS — B950 Streptococcus, group A, as the cause of diseases classified elsewhere: Secondary | ICD-10-CM | POA: Diagnosis present

## 2024-02-06 DIAGNOSIS — Z1152 Encounter for screening for COVID-19: Secondary | ICD-10-CM

## 2024-02-06 DIAGNOSIS — R0602 Shortness of breath: Secondary | ICD-10-CM | POA: Diagnosis present

## 2024-02-06 DIAGNOSIS — Z9981 Dependence on supplemental oxygen: Secondary | ICD-10-CM | POA: Diagnosis not present

## 2024-02-06 DIAGNOSIS — Z7951 Long term (current) use of inhaled steroids: Secondary | ICD-10-CM

## 2024-02-06 DIAGNOSIS — K219 Gastro-esophageal reflux disease without esophagitis: Secondary | ICD-10-CM | POA: Diagnosis present

## 2024-02-06 DIAGNOSIS — E876 Hypokalemia: Secondary | ICD-10-CM | POA: Diagnosis not present

## 2024-02-06 DIAGNOSIS — E1122 Type 2 diabetes mellitus with diabetic chronic kidney disease: Secondary | ICD-10-CM | POA: Diagnosis present

## 2024-02-06 DIAGNOSIS — F418 Other specified anxiety disorders: Secondary | ICD-10-CM | POA: Diagnosis present

## 2024-02-06 DIAGNOSIS — E1129 Type 2 diabetes mellitus with other diabetic kidney complication: Secondary | ICD-10-CM | POA: Diagnosis present

## 2024-02-06 DIAGNOSIS — Z87442 Personal history of urinary calculi: Secondary | ICD-10-CM

## 2024-02-06 DIAGNOSIS — G459 Transient cerebral ischemic attack, unspecified: Secondary | ICD-10-CM | POA: Diagnosis present

## 2024-02-06 DIAGNOSIS — Z8249 Family history of ischemic heart disease and other diseases of the circulatory system: Secondary | ICD-10-CM

## 2024-02-06 DIAGNOSIS — I714 Abdominal aortic aneurysm, without rupture, unspecified: Secondary | ICD-10-CM | POA: Diagnosis present

## 2024-02-06 DIAGNOSIS — J441 Chronic obstructive pulmonary disease with (acute) exacerbation: Principal | ICD-10-CM | POA: Diagnosis present

## 2024-02-06 DIAGNOSIS — L97529 Non-pressure chronic ulcer of other part of left foot with unspecified severity: Secondary | ICD-10-CM | POA: Diagnosis present

## 2024-02-06 DIAGNOSIS — Z7982 Long term (current) use of aspirin: Secondary | ICD-10-CM

## 2024-02-06 LAB — LACTIC ACID, PLASMA: Lactic Acid, Venous: 1.3 mmol/L (ref 0.5–1.9)

## 2024-02-06 LAB — VAS US ABI WITH/WO TBI
Left ABI: 1.08
Right ABI: 1.17

## 2024-02-06 LAB — BLOOD GAS, VENOUS
Acid-Base Excess: 7 mmol/L — ABNORMAL HIGH (ref 0.0–2.0)
Bicarbonate: 33.4 mmol/L — ABNORMAL HIGH (ref 20.0–28.0)
O2 Saturation: 84.9 %
Patient temperature: 37
pCO2, Ven: 54 mm[Hg] (ref 44–60)
pH, Ven: 7.4 (ref 7.25–7.43)
pO2, Ven: 56 mm[Hg] — ABNORMAL HIGH (ref 32–45)

## 2024-02-06 LAB — CBC WITH DIFFERENTIAL/PLATELET
Abs Immature Granulocytes: 0.05 10*3/uL (ref 0.00–0.07)
Basophils Absolute: 0.1 10*3/uL (ref 0.0–0.1)
Basophils Relative: 0 %
Eosinophils Absolute: 0.1 10*3/uL (ref 0.0–0.5)
Eosinophils Relative: 1 %
HCT: 30.7 % — ABNORMAL LOW (ref 39.0–52.0)
Hemoglobin: 9.3 g/dL — ABNORMAL LOW (ref 13.0–17.0)
Immature Granulocytes: 0 %
Lymphocytes Relative: 8 %
Lymphs Abs: 1.2 10*3/uL (ref 0.7–4.0)
MCH: 26 pg (ref 26.0–34.0)
MCHC: 30.3 g/dL (ref 30.0–36.0)
MCV: 85.8 fL (ref 80.0–100.0)
Monocytes Absolute: 0.3 10*3/uL (ref 0.1–1.0)
Monocytes Relative: 2 %
Neutro Abs: 12.8 10*3/uL — ABNORMAL HIGH (ref 1.7–7.7)
Neutrophils Relative %: 89 %
Platelets: 249 10*3/uL (ref 150–400)
RBC: 3.58 MIL/uL — ABNORMAL LOW (ref 4.22–5.81)
RDW: 16 % — ABNORMAL HIGH (ref 11.5–15.5)
WBC: 14.5 10*3/uL — ABNORMAL HIGH (ref 4.0–10.5)
nRBC: 0 % (ref 0.0–0.2)

## 2024-02-06 LAB — TROPONIN I (HIGH SENSITIVITY)
Troponin I (High Sensitivity): 9 ng/L (ref <18)
Troponin I (High Sensitivity): 9 ng/L (ref <18)

## 2024-02-06 LAB — RESP PANEL BY RT-PCR (RSV, FLU A&B, COVID)  RVPGX2
Influenza A by PCR: NEGATIVE
Influenza B by PCR: NEGATIVE
Resp Syncytial Virus by PCR: NEGATIVE
SARS Coronavirus 2 by RT PCR: NEGATIVE

## 2024-02-06 LAB — COMPREHENSIVE METABOLIC PANEL
ALT: 11 U/L (ref 0–44)
AST: 15 U/L (ref 15–41)
Albumin: 3.1 g/dL — ABNORMAL LOW (ref 3.5–5.0)
Alkaline Phosphatase: 89 U/L (ref 38–126)
Anion gap: 12 (ref 5–15)
BUN: 15 mg/dL (ref 8–23)
CO2: 24 mmol/L (ref 22–32)
Calcium: 8.6 mg/dL — ABNORMAL LOW (ref 8.9–10.3)
Chloride: 100 mmol/L (ref 98–111)
Creatinine, Ser: 1.14 mg/dL (ref 0.61–1.24)
GFR, Estimated: >60 mL/min (ref >60)
Glucose, Bld: 115 mg/dL — ABNORMAL HIGH (ref 70–99)
Potassium: 4 mmol/L (ref 3.5–5.1)
Sodium: 136 mmol/L (ref 135–145)
Total Bilirubin: 0.6 mg/dL (ref 0.0–1.2)
Total Protein: 6.6 g/dL (ref 6.5–8.1)

## 2024-02-06 LAB — BRAIN NATRIURETIC PEPTIDE: B Natriuretic Peptide: 215.9 pg/mL — ABNORMAL HIGH (ref 0.0–100.0)

## 2024-02-06 LAB — CBG MONITORING, ED
Glucose-Capillary: 245 mg/dL — ABNORMAL HIGH (ref 70–99)
Glucose-Capillary: 353 mg/dL — ABNORMAL HIGH (ref 70–99)

## 2024-02-06 MED ORDER — IPRATROPIUM-ALBUTEROL 0.5-2.5 (3) MG/3ML IN SOLN
3.0000 mL | Freq: Once | RESPIRATORY_TRACT | Status: AC
  Administered 2024-02-06: 3 mL via RESPIRATORY_TRACT
  Filled 2024-02-06: qty 3

## 2024-02-06 MED ORDER — METOPROLOL SUCCINATE ER 50 MG PO TB24
50.0000 mg | ORAL_TABLET | Freq: Every day | ORAL | Status: DC
  Administered 2024-02-07 – 2024-02-09 (×3): 50 mg via ORAL
  Filled 2024-02-06 (×3): qty 1

## 2024-02-06 MED ORDER — INSULIN ASPART 100 UNIT/ML IJ SOLN
0.0000 [IU] | Freq: Three times a day (TID) | INTRAMUSCULAR | Status: DC
  Administered 2024-02-06: 2 [IU] via SUBCUTANEOUS
  Administered 2024-02-07: 5 [IU] via SUBCUTANEOUS
  Administered 2024-02-07: 7 [IU] via SUBCUTANEOUS
  Administered 2024-02-07: 3 [IU] via SUBCUTANEOUS
  Administered 2024-02-08 (×2): 1 [IU] via SUBCUTANEOUS
  Filled 2024-02-06 (×5): qty 1

## 2024-02-06 MED ORDER — DOXYCYCLINE HYCLATE 100 MG PO TABS
100.0000 mg | ORAL_TABLET | Freq: Two times a day (BID) | ORAL | Status: DC
Start: 2024-02-06 — End: 2024-02-09
  Administered 2024-02-06 – 2024-02-09 (×6): 100 mg via ORAL
  Filled 2024-02-06 (×6): qty 1

## 2024-02-06 MED ORDER — BUPRENORPHINE HCL-NALOXONE HCL 8-2 MG SL SUBL
1.0000 | SUBLINGUAL_TABLET | Freq: Three times a day (TID) | SUBLINGUAL | Status: DC
  Administered 2024-02-06 – 2024-02-09 (×9): 1 via SUBLINGUAL
  Filled 2024-02-06 (×10): qty 1

## 2024-02-06 MED ORDER — ALLOPURINOL 100 MG PO TABS
600.0000 mg | ORAL_TABLET | Freq: Every day | ORAL | Status: DC
  Administered 2024-02-07 – 2024-02-09 (×3): 600 mg via ORAL
  Filled 2024-02-06 (×2): qty 6
  Filled 2024-02-06: qty 2

## 2024-02-06 MED ORDER — SIMVASTATIN 20 MG PO TABS
40.0000 mg | ORAL_TABLET | Freq: Every day | ORAL | Status: DC
  Administered 2024-02-06 – 2024-02-08 (×3): 40 mg via ORAL
  Filled 2024-02-06: qty 4
  Filled 2024-02-06 (×2): qty 2

## 2024-02-06 MED ORDER — METHYLPREDNISOLONE SODIUM SUCC 125 MG IJ SOLR: 80.0000 mg | Freq: Every day | INTRAMUSCULAR | Status: DC

## 2024-02-06 MED ORDER — ONDANSETRON HCL 4 MG/2ML IJ SOLN: 4.0000 mg | Freq: Three times a day (TID) | INTRAMUSCULAR | Status: DC | PRN

## 2024-02-06 MED ORDER — INSULIN ASPART 100 UNIT/ML IJ SOLN
0.0000 [IU] | Freq: Every day | INTRAMUSCULAR | Status: DC
  Administered 2024-02-06: 5 [IU] via SUBCUTANEOUS
  Filled 2024-02-06 (×2): qty 1

## 2024-02-06 MED ORDER — ASPIRIN 81 MG PO TBEC
81.0000 mg | DELAYED_RELEASE_TABLET | Freq: Every day | ORAL | Status: DC
  Administered 2024-02-07 – 2024-02-09 (×3): 81 mg via ORAL
  Filled 2024-02-06 (×3): qty 1

## 2024-02-06 MED ORDER — GABAPENTIN 400 MG PO CAPS
800.0000 mg | ORAL_CAPSULE | Freq: Four times a day (QID) | ORAL | Status: DC
  Administered 2024-02-06 – 2024-02-09 (×9): 800 mg via ORAL
  Filled 2024-02-06 (×2): qty 2
  Filled 2024-02-06: qty 8
  Filled 2024-02-06 (×6): qty 2
  Filled 2024-02-06: qty 8
  Filled 2024-02-06 (×5): qty 2

## 2024-02-06 MED ORDER — HYDRALAZINE HCL 20 MG/ML IJ SOLN: 5.0000 mg | INTRAMUSCULAR | Status: DC | PRN

## 2024-02-06 MED ORDER — CLOPIDOGREL BISULFATE 75 MG PO TABS
75.0000 mg | ORAL_TABLET | Freq: Every day | ORAL | Status: DC
  Administered 2024-02-07 – 2024-02-09 (×3): 75 mg via ORAL
  Filled 2024-02-06 (×3): qty 1

## 2024-02-06 MED ORDER — METHYLPREDNISOLONE SODIUM SUCC 125 MG IJ SOLR
125.0000 mg | Freq: Once | INTRAMUSCULAR | Status: AC
  Administered 2024-02-06: 125 mg via INTRAVENOUS
  Filled 2024-02-06: qty 2

## 2024-02-06 MED ORDER — ENOXAPARIN SODIUM 40 MG/0.4ML IJ SOSY
40.0000 mg | PREFILLED_SYRINGE | INTRAMUSCULAR | Status: DC
  Administered 2024-02-06 – 2024-02-08 (×3): 40 mg via SUBCUTANEOUS
  Filled 2024-02-06 (×3): qty 0.4

## 2024-02-06 MED ORDER — DULOXETINE HCL 30 MG PO CPEP
30.0000 mg | ORAL_CAPSULE | Freq: Every day | ORAL | Status: DC
Start: 2024-02-07 — End: 2024-02-09
  Administered 2024-02-07 – 2024-02-09 (×3): 30 mg via ORAL
  Filled 2024-02-06 (×3): qty 1

## 2024-02-06 MED ORDER — ADULT MULTIVITAMIN W/MINERALS CH
1.0000 | ORAL_TABLET | Freq: Every morning | ORAL | Status: DC
  Administered 2024-02-07 – 2024-02-09 (×3): 1 via ORAL
  Filled 2024-02-06 (×3): qty 1

## 2024-02-06 MED ORDER — TRAZODONE HCL 50 MG PO TABS
50.0000 mg | ORAL_TABLET | Freq: Every evening | ORAL | Status: DC | PRN
  Administered 2024-02-07 – 2024-02-08 (×3): 50 mg via ORAL
  Filled 2024-02-06 (×3): qty 1

## 2024-02-06 MED ORDER — IPRATROPIUM-ALBUTEROL 0.5-2.5 (3) MG/3ML IN SOLN
3.0000 mL | Freq: Four times a day (QID) | RESPIRATORY_TRACT | Status: DC
  Administered 2024-02-06 – 2024-02-08 (×8): 3 mL via RESPIRATORY_TRACT
  Filled 2024-02-06 (×9): qty 3

## 2024-02-06 MED ORDER — BISACODYL 5 MG PO TBEC: 5.0000 mg | DELAYED_RELEASE_TABLET | Freq: Every day | ORAL | Status: DC | PRN

## 2024-02-06 MED ORDER — ACETAMINOPHEN 325 MG PO TABS: 650.0000 mg | ORAL_TABLET | Freq: Four times a day (QID) | ORAL | Status: DC | PRN

## 2024-02-06 MED ORDER — ALBUTEROL SULFATE (2.5 MG/3ML) 0.083% IN NEBU: 2.5000 mg | INHALATION_SOLUTION | RESPIRATORY_TRACT | Status: DC | PRN

## 2024-02-06 MED ORDER — DM-GUAIFENESIN ER 30-600 MG PO TB12: 1.0000 | ORAL_TABLET | Freq: Two times a day (BID) | ORAL | Status: DC | PRN

## 2024-02-06 MED ORDER — EZETIMIBE 10 MG PO TABS
10.0000 mg | ORAL_TABLET | Freq: Every day | ORAL | Status: DC
  Administered 2024-02-07 – 2024-02-09 (×3): 10 mg via ORAL
  Filled 2024-02-06 (×3): qty 1

## 2024-02-06 MED ORDER — SODIUM CHLORIDE 0.9 % IV BOLUS
500.0000 mL | Freq: Once | INTRAVENOUS | Status: AC
  Administered 2024-02-06: 500 mL via INTRAVENOUS

## 2024-02-06 NOTE — Progress Notes (Signed)
 Hematology/Oncology Consult note [facility]  [phone] [phone]  Patient Care Team: Ziglar, Eli Phillips, MD as PCP - General (Family Medicine) Glory Buff, RN as Oncology Nurse Navigator Carmina Miller, MD as Referring Physician (Radiation Oncology) Creig Hines, MD as Consulting Physician (Hematology and Oncology)   Name of the patient: Christian Fields  [id number]  [birth year]   Date of visit: 02/06/24  Diagnosis-  stage I lung cancer s/p radiation Normocytic anemia with component of iron deficiency  Chief complaint/ Reason for visit-posthospital discharge follow-up  Heme/Onc history: Patient is a 68 year old male who was admitted to the hospital in April 2021 for community-acquired pneumonia and as a part of the work-up he had a CT angio chest done which incidentally showed a 1 cm irregular left upper lobe lung nodule repeat CT scan in August 2021 showed an interval increase in the size from 1 to 1.4 cm.  No appreciable thoracic adenopathy.  Severe centrilobular emphysema.  Chronic pulmonary hypertension.  Patient has been seen by Dr. Meredeth Ide and has been considered to be high risk for biopsy from their standpoint.  He underwent SBRT to the left upper lobe lung lesion.   Labs from September 2021 showed a normal liver and kidney function.  TSH was normal.  Vitamin D levels were low.  CBC showed white count of 12.6, H&H of 10.9/37 with an MCV of 102 and a platelet count of 358.   Anemia work-up from 10/27/2020 showed a mildly low ferritin level of 28 with an iron saturation of 8% myeloma panel, serum free light chains, haptoglobin, TSH and LDH was normal.  B12 levels were low at 166.    Interval history-patient is doing well since his hospital discharge.  He has baseline fatigue and exertional shortness of breath from his COPD and is on home oxygen.  Denies any blood loss in his stool or urine  ECOG PS- 3 Pain scale- 0   Review of  systems- Review of Systems  Constitutional:  Positive for malaise/fatigue. Negative for chills, fever and weight loss.  HENT:  Negative for congestion, ear discharge and nosebleeds.   Eyes:  Negative for blurred vision.  Respiratory:  Positive for shortness of breath. Negative for cough, hemoptysis, sputum production and wheezing.   Cardiovascular:  Negative for chest pain, palpitations, orthopnea and claudication.  Gastrointestinal:  Negative for abdominal pain, blood in stool, constipation, diarrhea, heartburn, melena, nausea and vomiting.  Genitourinary:  Negative for dysuria, flank pain, frequency, hematuria and urgency.  Musculoskeletal:  Negative for back pain, joint pain and myalgias.  Skin:  Negative for rash.  Neurological:  Negative for dizziness, tingling, focal weakness, seizures, weakness and headaches.  Endo/Heme/Allergies:  Does not bruise/bleed easily.  Psychiatric/Behavioral:  Negative for depression and suicidal ideas. The patient does not have insomnia.       Allergies  Allergen Reactions   Bee Venom Anaphylaxis   Linezolid Other (See Comments)    Thrombocytopenia, hyperlactatemia   Azithromycin Itching and Rash     Past Medical History:  Diagnosis Date   Anemia    Anxiety    Arthritis    Asthma    Cancer (HCC)    Basal Cell Skin Cancer   Chronic back pain    COPD (chronic obstructive pulmonary disease) (HCC)    Depression    Diabetes mellitus (HCC)    Dyspnea    GERD (gastroesophageal reflux disease)    Gout    Gout    Headache  History of blood clots    Left Leg--July 2018   History of kidney stones    Hyperlipidemia    Hyperlipidemia    Hypertension    Kidney stones    Neuropathy    On home oxygen therapy    2 L / M   Pneumonia 06/2017   Sleep apnea    Ulcer of foot (HCC)    Right     Past Surgical History:  Procedure Laterality Date   APPENDECTOMY     DG FEET 2 VIEWS BILAT     IRRIGATION AND DEBRIDEMENT FOOT Left 07/11/2023    Procedure: IRRIGATION AND DEBRIDEMENT FOOT;  Surgeon: Gwyneth Revels, DPM;  Location: [facility] ORS;  Service: Orthopedics/Podiatry;  Laterality: Left;   LIPOMA EXCISION Right 08/15/2017   Procedure: EXCISION TUMOR(CYST) FOOT;  Surgeon: Recardo Evangelist, DPM;  Location: [facility] ORS;  Service: Podiatry;  Laterality: Right;   LOWER EXTREMITY ANGIOGRAPHY Left 07/03/2023   Procedure: Lower Extremity Angiography;  Surgeon: Annice Needy, MD;  Location: [facility] INVASIVE CV LAB;  Service: Cardiovascular;  Laterality: Left;   OTHER SURGICAL HISTORY Bilateral Foot surgery    Social History   Socioeconomic History   Marital status: Widowed    Spouse name: Not on file   Number of children: Not on file   Years of education: Not on file   Highest education level: Not on file  Occupational History   Not on file  Tobacco Use   Smoking status: Former    Current packs/day: 0.50    Average packs/day: 0.5 packs/day for 7.2 years (3.6 ttl pk-yrs)    Types: Cigarettes    Start date: 2018   Smokeless tobacco: Never  Vaping Use   Vaping status: Never Used  Substance and Sexual Activity   Alcohol use: Yes    Alcohol/week: 1.0 standard drink of alcohol    Types: 1 Cans of beer per week    Comment: occ   Drug use: No   Sexual activity: Not Currently  Other Topics Concern   Not on file  Social History Narrative   Not on file   Social Drivers of Health   Financial Resource Strain: Low Risk  (11/11/2023)   Received from [facility]   Overall Financial Resource Strain (CARDIA)    Difficulty of Paying Living Expenses: Not hard at all  Food Insecurity: No Food Insecurity (11/11/2023)   Received from [facility]   Hunger Vital Sign    Worried About Running Out of Food in the Last Year: Never true    Ran Out of Food in the Last Year: Never true  Transportation Needs: No Transportation Needs (11/11/2023)   Received from [facility] - Transportation     In the past 12 months, has lack of transportation kept you from medical appointments or from getting medications?: No    Lack of Transportation (Non-Medical): No  Physical Activity: Not on file  Stress: Not on file  Social Connections: Not on file  Intimate Partner Violence: Not At Risk (10/19/2023)   Humiliation, Afraid, Rape, and Kick questionnaire    Fear of Current or Ex-Partner: No    Emotionally Abused: No    Physically Abused: No    Sexually Abused: No    Family History  Problem Relation Age of Onset   Hypertension Mother      Current Outpatient Medications:    acetaminophen (TYLENOL) 325 MG tablet, Take 650 mg by mouth every 6 (  six) hours as needed., Disp: , Rfl:    albuterol (VENTOLIN HFA) 108 (90 Base) MCG/ACT inhaler, Inhale 2 puffs into the lungs every 4 (four) hours as needed for wheezing or shortness of breath., Disp: , Rfl:    allopurinol (ZYLOPRIM) 300 MG tablet, Take 600 mg by mouth daily., Disp: , Rfl:    Amino Acids-Protein Hydrolys (PRO-STAT SUGAR FREE PO), Take 30 mLs by mouth 2 (two) times daily., Disp: , Rfl:    aspirin EC 81 MG tablet, Take 1 tablet by mouth daily., Disp: , Rfl:    azelastine (ASTELIN) 0.1 % nasal spray, Place 2 sprays into both nostrils 2 (two) times daily., Disp: , Rfl:    bisacodyl (DULCOLAX) 5 MG EC tablet, Take 1 tablet (5 mg total) by mouth daily as needed for moderate constipation., Disp: 25 tablet, Rfl: 0   clopidogrel (PLAVIX) 75 MG tablet, Take 1 tablet (75 mg total) by mouth daily., Disp: 30 tablet, Rfl: 1   cyanocobalamin (VITAMIN B12) 1000 MCG/ML injection, Inject 1 mL (1,000 mcg total) into the muscle every 30 (thirty) days., Disp: 1 mL, Rfl: 11   Dextromethorphan-guaiFENesin 10-100 MG/5ML liquid, Take 5 mLs by mouth every 4 (four) hours as needed (cough)., Disp: , Rfl:    DULoxetine (CYMBALTA) 60 MG capsule, Take 60 mg by mouth daily., Disp: , Rfl:    Ensure Max Protein (ENSURE MAX PROTEIN) LIQD, Take 330 mLs (11 oz total) by  mouth 2 (two) times daily., Disp: 19800 mL, Rfl: 2   escitalopram (LEXAPRO) 10 MG tablet, Take 1 tablet (10 mg total) by mouth daily., Disp: 30 tablet, Rfl: 0   ezetimibe (ZETIA) 10 MG tablet, Take 10 mg by mouth daily., Disp: , Rfl:    fluticasone (FLONASE) 50 MCG/ACT nasal spray, Place 1 spray into both nostrils daily., Disp: , Rfl:    Fluticasone-Umeclidin-Vilant (TRELEGY ELLIPTA) 100-62.5-25 MCG/ACT AEPB, Inhale 1 puff into the lungs in the morning., Disp: , Rfl:    gabapentin (NEURONTIN) 400 MG capsule, Take 400 mg by mouth 3 (three) times daily., Disp: , Rfl:    ipratropium-albuterol (DUONEB) 0.5-2.5 (3) MG/3ML SOLN, Take 3 mLs by nebulization every 6 (six) hours as needed (wheezing)., Disp: , Rfl:    levalbuterol (XOPENEX) 0.63 MG/3ML nebulizer solution, Take 3 mLs (0.63 mg total) by nebulization every 6 (six) hours as needed for wheezing or shortness of breath., Disp: 3 mL, Rfl: 12   metFORMIN (GLUCOPHAGE) 1000 MG tablet, Take 1,000 mg by mouth daily., Disp: , Rfl:    metoprolol succinate (TOPROL-XL) 50 MG 24 hr tablet, Take 50 mg by mouth daily., Disp: , Rfl:    Multiple Vitamins-Minerals (MULTIVITAMIN ADULT, MINERALS, PO), Take 1 tablet by mouth every morning., Disp: , Rfl:    naloxone (NARCAN) nasal spray 4 mg/0.1 mL, Place 1 spray into the nose once., Disp: , Rfl:    ondansetron (ZOFRAN-ODT) 4 MG disintegrating tablet, Take 4 mg by mouth every 8 (eight) hours as needed for nausea., Disp: , Rfl:    OXYGEN, Inhale 2 L into the lungs., Disp: , Rfl:    pantoprazole (PROTONIX) 40 MG tablet, Take 40 mg by mouth daily., Disp: , Rfl:    roflumilast (DALIRESP) 500 MCG TABS tablet, Take 500 mcg by mouth daily., Disp: , Rfl:    simvastatin (ZOCOR) 40 MG tablet, Take 40 mg by mouth at bedtime., Disp: , Rfl:    Syringe/Needle, Disp, (SYRINGE 3CC/25GX1") 25G X 1" 3 ML MISC, 1 Syringe by Does not apply route every  30 (thirty) days., Disp: 12 each, Rfl: 0   thiamine (VITAMIN B-1) 100 MG tablet, Take 1  tablet (100 mg total) by mouth daily., Disp: 30 tablet, Rfl: 2  Physical exam:  Vitals:   02/04/24 1514  BP: 120/82  Pulse: 89  Resp: 19  Temp: 98 F (36.7 C)  TempSrc: Tympanic  SpO2: 97%  Weight: 175 lb 14.4 oz (79.8 kg)   Physical Exam Cardiovascular:     Rate and Rhythm: Normal rate and regular rhythm.     Heart sounds: Normal heart sounds.  Pulmonary:     Effort: Pulmonary effort is normal.     Breath sounds: Normal breath sounds.  Skin:    General: Skin is warm and dry.  Neurological:     Mental Status: He is alert and oriented to person, place, and time.         Latest Ref Rng & Units 10/19/2023    5:49 AM  CMP  Glucose 70 - 99 mg/dL 409   BUN 8 - 23 mg/dL 32   Creatinine 8.11 - 1.24 mg/dL 9.14   Sodium 782 - 956 mmol/L 137   Potassium 3.5 - 5.1 mmol/L 3.9   Chloride 98 - 111 mmol/L 96   CO2 22 - 32 mmol/L 32   Calcium 8.9 - 10.3 mg/dL 8.7       Latest Ref Rng & Units 02/04/2024    3:46 PM  CBC  WBC 4.0 - 10.5 K/uL 10.7   Hemoglobin 13.0 - 17.0 g/dL 21.3   Hematocrit 08.6 - 52.0 % 33.3   Platelets 150 - 400 K/uL 241     No images are attached to the encounter.  VAS [place] ABI WITH/WO TBI Result Date: 02/06/2024  LOWER EXTREMITY DOPPLER STUDY Patient Name:  RAHN LACUESTA  Date of Exam:   02/05/2024 Medical Rec #: [id number]       Accession #:    [id number] Date of Birth: 10-Nov-1956       Patient Gender: M Patient Age:   51 years Exam Location:  [facility] Procedure:      VAS [place] ABI WITH/WO TBI Referring Phys: --------------------------------------------------------------------------------  Indications: Peripheral artery disease, and Pain swerlling.  Vascular Interventions: 07/03/2023 Percutaneous transluminal angioplasty of left                         SFA with 6 mm diameter by 15 cm length Lutonix                         drug-coated angioplasty balloon                         5. Stent placement to the left SFA with 7 mm diameter by                          10 cm length life stent. Comparison Study: 11/2020 Performing Technologist: Salvadore Farber RVT  Examination Guidelines: A complete evaluation includes at minimum, Doppler waveform signals and systolic blood pressure reading at the level of bilateral brachial, anterior tibial, and posterior tibial arteries, when vessel segments are accessible. Bilateral testing is considered an integral part of a complete examination. Photoelectric Plethysmograph (PPG) waveforms and toe systolic pressure readings are included as required and additional duplex testing as needed. Limited examinations for reoccurring indications may be performed as noted.  ABI  Findings: +---------+------------------+-----+---------+--------+ Right    Rt Pressure (mmHg)IndexWaveform Comment  +---------+------------------+-----+---------+--------+ Brachial 129                                      +---------+------------------+-----+---------+--------+ PTA      143               1.10 biphasic          +---------+------------------+-----+---------+--------+ DP       152               1.17 triphasic         +---------+------------------+-----+---------+--------+ Great Toe92                0.71 Abnormal          +---------+------------------+-----+---------+--------+ +---------+------------------+-----+---------+-------+ Left     Lt Pressure (mmHg)IndexWaveform Comment +---------+------------------+-----+---------+-------+ Brachial 130                                     +---------+------------------+-----+---------+-------+ PTA      141               1.08 triphasic        +---------+------------------+-----+---------+-------+ DP       140               1.08 triphasic        +---------+------------------+-----+---------+-------+ Great Toe103               0.79 Abnormal         +---------+------------------+-----+---------+-------+ +-------+-----------+-----------+------------+------------+  ABI/TBIToday's ABIToday's TBIPrevious ABIPrevious TBI +-------+-----------+-----------+------------+------------+ Right  1.17       .71        1.13        1.21         +-------+-----------+-----------+------------+------------+ Left   1.08       .79        1.01        .92          +-------+-----------+-----------+------------+------------+ Bilateral ABIs appear essentially unchanged compared to prior study on 2022.  Summary: Right: Resting right ankle-brachial index is within normal range. The right toe-brachial index is normal. Left: Resting left ankle-brachial index is within normal range. The left toe-brachial index is normal. *See table(s) above for measurements and observations.  Electronically signed by Festus Barren MD on 02/06/2024 at 11:53:43 AM.    Final    VAS [place] AAA DUPLEX Result Date: 02/06/2024 ABDOMINAL AORTA STUDY Patient Name:  ALEXANDRO LINE  Date of Exam:   02/05/2024 Medical Rec #: [id number]       Accession #:    [id number] Date of Birth: 07-Oct-1956       Patient Gender: M Patient Age:   75 years Exam Location:  [facility] Procedure:      VAS [place] AAA DUPLEX Referring Phys: Festus Barren --------------------------------------------------------------------------------  Comparison Study: CT from 2022 = 3.8cm distal AAA Performing Technologist: Salvadore Farber RVT  Examination Guidelines: A complete evaluation includes B-mode imaging, spectral Doppler, color Doppler, and power Doppler as needed of all accessible portions of each vessel. Bilateral testing is considered an integral part of a complete examination. Limited examinations for reoccurring indications may be performed as noted.  Abdominal Aorta Findings: +-----------+-------+----------+----------+--------+--------+--------+ Location   AP (cm)Trans (cm)PSV (cm/s)WaveformThrombusComments +-----------+-------+----------+----------+--------+--------+--------+ Proximal   2.58   2.47  63                                  +-----------+-------+----------+----------+--------+--------+--------+ Mid        2.11   1.87      51                                 +-----------+-------+----------+----------+--------+--------+--------+ Distal     3.90   3.73      40                                 +-----------+-------+----------+----------+--------+--------+--------+ RT CIA Prox1.2    1.2       163                                +-----------+-------+----------+----------+--------+--------+--------+ LT CIA Prox1.3    1.2       106                                +-----------+-------+----------+----------+--------+--------+--------+  Summary: Abdominal Aorta: There 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 The largest aortic measurement is 3.9 cm. The largest aortic diameter has increased compared to prior exam. Previous diameter measurement was 3.8 cm obtained on 02/2021 CT.  *See table(s) above for measurements and observations.  Electronically signed by Festus Barren MD on 02/06/2024 at 11:53:34 AM.    Final      Assessment and plan- Patient is a 68 y.o. male who is here for follow-up of following issues:  History of stage I lung cancer s/p SBRT in 2021.Patient had a CT chest without contrast in November 2024 which did not show any evidence of recurrent or progressive disease.  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  Decrease in the size of right upper lobe pulmonary nodule which was likely inflammatory.  I will plan to get a repeat CT chest without contrast next month and see him 2 weeks later.  Normocytic anemia:Ferritin and iron studies and B12 levels were checked today.  B12 levels are normal but ferritin levels are low at 30.  I recommend IV iron.  Discussed risks and benefits of IV iron including all but not limited to possible risk of infusion reaction.  Patient understands and agrees to proceed as planned.  Will plan to give him 2 doses of Feraheme    Visit Diagnosis 1. Encounter for follow-up surveillance of lung cancer   2. Other iron deficiency anemia      Dr. Owens Shark, MD, MPH [facility] [phone] 02/06/2024 12:45 PM

## 2024-02-06 NOTE — ED Provider Notes (Signed)
 [facility] Provider Note    Event Date/Time   First MD Initiated Contact with Patient 02/06/24 1728     (approximate)   History   Shortness of Breath   HPI  Christian Fields is a 68 y.o. male with history of chronic hypoxic respiratory failure on 3 L O2, end-stage COPD, hypertension, HFpEF, chronic pain on Suboxone, type 2 diabetes, PAD, hyperlipidemia, and gout who presents with shortness of breath, gradual onset since yesterday, worsening today, associated with cough as well as with subjective fever.  He denies any vomiting or diarrhea.  He has had no leg swelling.  He denies any significant chest pain.  EMS reports that when they arrived the patient was not on oxygen and his O2 saturation was 75% on room air.  He received albuterol and a DuoNeb prior to arrival.  I reviewed the past medical records.  The patient was most recently admitted to the hospitalist service in December with respiratory failure requiring BiPAP thought to be due to CAP and COPD exacerbation.   Physical Exam   Triage Vital Signs: ED Triage Vitals [02/06/24 1728]  Encounter Vitals Group     BP      Systolic BP Percentile      Diastolic BP Percentile      Pulse      Resp      Temp      Temp src      SpO2      Weight 174 lb 2.6 oz (79 kg)     Height 5\' 7"  (1.702 m)     Head Circumference      Peak Flow      Pain Score 0     Pain Loc      Pain Education      Exclude from Growth Chart     Most recent vital signs: Vitals:   02/06/24 1900 02/06/24 1930  BP: 118/69 113/64  Pulse: (!) 107 (!) 108  Resp: 19   Temp:    SpO2: 93% 96%     General: Alert, no distress.  CV:  Good peripheral perfusion.  Resp:  Slight increased respiratory effort, speaking full sentences.  Diminished breath sounds bilaterally. Abd:  No distention.  Other:  No peripheral edema.   ED Results / Procedures / Treatments   Labs (all labs ordered are listed, but only abnormal results are  displayed) Labs Reviewed  COMPREHENSIVE METABOLIC PANEL - Abnormal; Notable for the following components:      Result Value   Glucose, Bld 115 (*)    Calcium 8.6 (*)    Albumin 3.1 (*)    All other components within normal limits  CBC WITH DIFFERENTIAL/PLATELET - Abnormal; Notable for the following components:   WBC 14.5 (*)    RBC 3.58 (*)    Hemoglobin 9.3 (*)    HCT 30.7 (*)    RDW 16.0 (*)    Neutro Abs 12.8 (*)    All other components within normal limits  BRAIN NATRIURETIC PEPTIDE - Abnormal; Notable for the following components:   B Natriuretic Peptide 215.9 (*)    All other components within normal limits  BLOOD GAS, VENOUS - Abnormal; Notable for the following components:   pO2, Ven 56 (*)    Bicarbonate 33.4 (*)    Acid-Base Excess 7.0 (*)    All other components within normal limits  RESP PANEL BY RT-PCR (RSV, FLU A&B, COVID)  RVPGX2  EXPECTORATED SPUTUM ASSESSMENT Gay Filler  STAIN, RFLX TO RESP C  RESPIRATORY PANEL BY PCR  LACTIC ACID, PLASMA  BASIC METABOLIC PANEL  CBC  TROPONIN I (HIGH SENSITIVITY)  TROPONIN I (HIGH SENSITIVITY)     EKG  ED ECG REPORT I, Dionne Bucy, the attending physician, personally viewed and interpreted this ECG.  Date: 02/06/2024 EKG Time: 1747 Rate: 108 Rhythm: Tachycardia QRS Axis: normal Intervals: normal ST/T Wave abnormalities: normal Narrative Interpretation: no evidence of acute ischemia    RADIOLOGY  Chest x-ray: I independently viewed and interpreted the images; there is no focal consolidation or edema   PROCEDURES:  Critical Care performed: Yes, see critical care procedure note(s)  .Critical Care  Performed by: Dionne Bucy, MD Authorized by: Dionne Bucy, MD   Critical care provider statement:    Critical care time (minutes):  30   Critical care time was exclusive of:  Separately billable procedures and treating other patients   Critical care was necessary to treat or prevent imminent  or life-threatening deterioration of the following conditions:  Respiratory failure   Critical care was time spent personally by me on the following activities:  Development of treatment plan with patient or surrogate, discussions with consultants, evaluation of patient's response to treatment, examination of patient, ordering and review of laboratory studies, ordering and review of radiographic studies, ordering and performing treatments and interventions, pulse oximetry, re-evaluation of patient's condition, review of old charts and obtaining history from patient or surrogate   Care discussed with: admitting provider      MEDICATIONS ORDERED IN ED: Medications  ipratropium-albuterol (DUONEB) 0.5-2.5 (3) MG/3ML nebulizer solution 3 mL (has no administration in time range)  albuterol (PROVENTIL) (2.5 MG/3ML) 0.083% nebulizer solution 2.5 mg (has no administration in time range)  dextromethorphan-guaiFENesin (MUCINEX DM) 30-600 MG per 12 hr tablet 1 tablet (has no administration in time range)  methylPREDNISolone sodium succinate (SOLU-MEDROL) 125 mg/2 mL injection 80 mg (has no administration in time range)  ondansetron (ZOFRAN) injection 4 mg (has no administration in time range)  hydrALAZINE (APRESOLINE) injection 5 mg (has no administration in time range)  acetaminophen (TYLENOL) tablet 650 mg (has no administration in time range)  insulin aspart (novoLOG) injection 0-9 Units (has no administration in time range)  insulin aspart (novoLOG) injection 0-5 Units (has no administration in time range)  enoxaparin (LOVENOX) injection 40 mg (has no administration in time range)  ipratropium-albuterol (DUONEB) 0.5-2.5 (3) MG/3ML nebulizer solution 3 mL (3 mLs Nebulization Given 02/06/24 1747)  ipratropium-albuterol (DUONEB) 0.5-2.5 (3) MG/3ML nebulizer solution 3 mL (3 mLs Nebulization Given 02/06/24 1747)  methylPREDNISolone sodium succinate (SOLU-MEDROL) 125 mg/2 mL injection 125 mg (125 mg Intravenous  Given 02/06/24 1747)  sodium chloride 0.9 % bolus 500 mL (0 mLs Intravenous Stopped 02/06/24 1846)     IMPRESSION / MDM / ASSESSMENT AND PLAN / ED COURSE  I reviewed the triage vital signs and the nursing notes.  68 year old male with PMH as noted above presents with worsening shortness of breath since yesterday along with subjective fever and cough.  On exam the patient has diminished breath sounds bilaterally.  He demonstrates increased work of breathing but no acute respiratory distress.  There is no peripheral edema.  Differential diagnosis includes, but is not limited to, COPD exacerbation, acute bronchitis, influenza, COVID, or other viral syndrome, bacterial pneumonia, less likely CHF exacerbation or other cardiac cause.  We will give additional bronchodilators, Solu-Medrol, fluid bolus, obtain chest x-ray, lab workup, and reassess.  Patient's presentation is most consistent with acute presentation with  potential threat to life or bodily function.  The patient is on the cardiac monitor to evaluate for evidence of arrhythmia and/or significant heart rate changes.  ----------------------------------------- 8:00 PM on 02/06/2024 -----------------------------------------  Chest x-ray does not show any significant acute findings.  Lab workup is overall reassuring.  Respiratory panel is negative.  CBC shows some leukocytosis.  VBG shows a normal pCO2.  Troponin is negative.  Lactate is normal.  The patient has improved but is still requiring 5 L of O2 to maintain a saturation in the low 90s.  Therefore, he will need admission for further management.  I consulted Dr. Ellis Parents from the hospitalist service; based on our discussion he agrees to evaluate the patient for admission.  FINAL CLINICAL IMPRESSION(S) / ED DIAGNOSES   Final diagnoses:  COPD exacerbation (HCC)  Acute on chronic respiratory failure with hypoxia (HCC)     Rx / DC Orders   ED Discharge Orders     None        Note:   This document was prepared using Dragon voice recognition software and may include unintentional dictation errors.    Dionne Bucy, MD 02/06/24 2053

## 2024-02-06 NOTE — ED Notes (Signed)
 Pt is asking for his "gabapentin that I take 800mg  4x a day". This RN advised him a pharm tech would come around and confirm his meds and get them ordered as prescribed and he would get them ASAP.

## 2024-02-06 NOTE — ED Notes (Signed)
 MD pulled pt off BIPAP at this time and advised it was ok to give water to pt.

## 2024-02-06 NOTE — H&P (Signed)
 History and Physical    Christian Fields WJX:[id number] DOB: 08-19-1956 DOA: 02/06/2024  Referring MD/NP/PA:   PCP: Cleon Dew, FNP   Patient coming from:  The patient is coming from home.     Chief Complaint: SOB  HPI: Christian Fields is a 68 y.o. male with medical history significant of  COPD on 2-3 L oxygen, hypertension, hyperlipidemia, diabetes mellitus, asthma, TIA, GERD, gout, depression, OSA not on CPAP, anemia, AAA, polysubstance abuse, chronic pain syndrome on Suboxone, non-small cell lung cancer (s/p of RXT), CKD-2, who presents with SOB.  Patient states that his shortness of breath has been progressively worsening since yesterday.  He has dry cough, no chest pain, fever or chills.  No nausea, vomiting, diarrhea or abdominal pain.  No symptoms of UTI.  Per report, patient was found to have oxygen desaturation to 75% when patient was not wearing home oxygen), initially started on 6L oxygen, still with respiratory distress, BiPAP is ordered in ED.  When I saw patient in ED, he is off BiPAP and is.  On 4L oxygen with 95% of saturation, with moderate respiratory distress.  Data reviewed independently and ED Course: pt was found to have WBC 14.5, negative PCR for COVID, flu and RSV, lactic acid 1.3, troponin 9 x 2, GFR> 60.  Temperature 99.9, blood pressure 102/66, heart rate of 106, RR 23.  Patient is admitted to PCU as inpatient.  Chest x-ray: Hyperinflation with chronic changes. Enlarged presumed pulmonary arteries with fullness of the lung hila. Stable spiculated left upper lobe lung nodule. Please correlate with prior CT.    EKG: I have personally reviewed.  Sinus rhythm, QTc 403, low voltage, LAD, poor R wave progression  Review of Systems:   General: no fevers, chills, no body weight gain, has fatigue HEENT: no blurry vision, hearing changes or sore throat Respiratory:  has dyspnea, coughing, wheezing CV: no chest pain, no palpitations GI: no nausea, vomiting,  abdominal pain, diarrhea, constipation GU: no dysuria, burning on urination, increased urinary frequency, hematuria  Ext: no leg edema Neuro: no unilateral weakness, numbness, or tingling, no vision change or hearing loss Skin: no rash, no skin tear. MSK: No muscle spasm, no deformity, no limitation of range of movement in spin Heme: No easy bruising.  Travel history: No recent long distant travel.   Allergy:  Allergies  Allergen Reactions   Bee Venom Anaphylaxis   Linezolid Other (See Comments)    Thrombocytopenia, hyperlactatemia   Azithromycin Itching and Rash    Past Medical History:  Diagnosis Date   Anemia    Anxiety    Arthritis    Asthma    Cancer (HCC)    Basal Cell Skin Cancer   Chronic back pain    COPD (chronic obstructive pulmonary disease) (HCC)    Depression    Diabetes mellitus (HCC)    Dyspnea    GERD (gastroesophageal reflux disease)    Gout    Gout    Headache    History of blood clots    Left Leg--July 2018   History of kidney stones    Hyperlipidemia    Hyperlipidemia    Hypertension    Kidney stones    Neuropathy    On home oxygen therapy    2 L / M   Pneumonia 06/2017   Sleep apnea    Ulcer of foot (HCC)    Right    Past Surgical History:  Procedure Laterality Date   APPENDECTOMY  DG FEET 2 VIEWS BILAT     IRRIGATION AND DEBRIDEMENT FOOT Left 07/11/2023   Procedure: IRRIGATION AND DEBRIDEMENT FOOT;  Surgeon: Gwyneth Revels, DPM;  Location: [facility];  Service: Orthopedics/Podiatry;  Laterality: Left;   LIPOMA EXCISION Right 08/15/2017   Procedure: EXCISION TUMOR(CYST) FOOT;  Surgeon: Recardo Evangelist, DPM;  Location: [facility];  Service: Podiatry;  Laterality: Right;   LOWER EXTREMITY ANGIOGRAPHY Left 07/03/2023   Procedure: Lower Extremity Angiography;  Surgeon: Annice Needy, MD;  Location: [facility];  Service: Cardiovascular;  Laterality: Left;   OTHER SURGICAL HISTORY Bilateral Foot surgery    Social History:  reports  that he has quit smoking. His smoking use included cigarettes. He started smoking about 7 years ago. He has a 3.6 pack-year smoking history. He has never used smokeless tobacco. He reports current alcohol use of about 1.0 standard drink of alcohol per week. He reports that he does not use drugs.  Family History:  Family History  Problem Relation Age of Onset   Hypertension Mother      Prior to Admission medications   Medication Sig Start Date End Date Taking? Authorizing Provider  acetaminophen (TYLENOL) 325 MG tablet Take 650 mg by mouth every 6 (six) hours as needed.    [provider]  albuterol (VENTOLIN HFA) 108 (90 Base) MCG/ACT inhaler Inhale 2 puffs into the lungs every 4 (four) hours as needed for wheezing or shortness of breath.    [provider]  allopurinol (ZYLOPRIM) 300 MG tablet Take 600 mg by mouth daily. 02/24/20   [provider]  Amino Acids-Protein Hydrolys (PRO-STAT SUGAR FREE PO) Take 30 mLs by mouth 2 (two) times daily.    [provider]  aspirin EC 81 MG tablet Take 1 tablet by mouth daily.    [provider]  azelastine (ASTELIN) 0.1 % nasal spray Place 2 sprays into both nostrils 2 (two) times daily. 05/27/23   [provider]  bisacodyl (DULCOLAX) 5 MG EC tablet Take 1 tablet (5 mg total) by mouth daily as needed for moderate constipation. 09/04/23   Loyce Dys, MD  clopidogrel (PLAVIX) 75 MG tablet Take 1 tablet (75 mg total) by mouth daily. 02/02/21   Willeen Niece, MD  cyanocobalamin (VITAMIN B12) 1000 MCG/ML injection Inject 1 mL (1,000 mcg total) into the muscle every 30 (thirty) days. 07/12/22   Earna Coder, MD  Dextromethorphan-guaiFENesin 10-100 MG/5ML liquid Take 5 mLs by mouth every 4 (four) hours as needed (cough). 10/01/23   [provider]  DULoxetine (CYMBALTA) 60 MG capsule Take 60 mg by mouth daily. 10/02/23 11/01/23  [provider]  Ensure Max Protein (ENSURE MAX  PROTEIN) LIQD Take 330 mLs (11 oz total) by mouth 2 (two) times daily. 10/09/23   Marcelino Duster, MD  escitalopram (LEXAPRO) 10 MG tablet Take 1 tablet (10 mg total) by mouth daily. 10/22/23   Pennie Banter, DO  ezetimibe (ZETIA) 10 MG tablet Take 10 mg by mouth daily. 02/24/20   [provider]  fluticasone (FLONASE) 50 MCG/ACT nasal spray Place 1 spray into both nostrils daily. 10/16/18   [provider]  Fluticasone-Umeclidin-Vilant (TRELEGY ELLIPTA) 100-62.5-25 MCG/ACT AEPB Inhale 1 puff into the lungs in the morning. 09/12/21   [provider]  gabapentin (NEURONTIN) 400 MG capsule Take 400 mg by mouth 3 (three) times daily. 10/01/23 10/31/23  [provider]  ipratropium-albuterol (DUONEB) 0.5-2.5 (3) MG/3ML SOLN Take 3 mLs by nebulization every 6 (six) hours  as needed (wheezing).    [provider]  levalbuterol Pauline Aus) 0.63 MG/3ML nebulizer solution Take 3 mLs (0.63 mg total) by nebulization every 6 (six) hours as needed for wheezing or shortness of breath. 09/04/23   Loyce Dys, MD  metFORMIN (GLUCOPHAGE) 1000 MG tablet Take 1,000 mg by mouth daily.    [provider]  metoprolol succinate (TOPROL-XL) 50 MG 24 hr tablet Take 50 mg by mouth daily. 02/24/20   [provider]  Multiple Vitamins-Minerals (MULTIVITAMIN ADULT, MINERALS, PO) Take 1 tablet by mouth every morning.    [provider]  naloxone [facility]) nasal spray 4 mg/0.1 mL Place 1 spray into the nose once.    [provider]  ondansetron (ZOFRAN-ODT) 4 MG disintegrating tablet Take 4 mg by mouth every 8 (eight) hours as needed for nausea.    [provider]  OXYGEN Inhale 2 L into the lungs.    [provider]  pantoprazole (PROTONIX) 40 MG tablet Take 40 mg by mouth daily. 06/20/21   [provider]  roflumilast (DALIRESP) 500 MCG TABS tablet Take 500 mcg by mouth daily. 08/15/21   [provider]   simvastatin (ZOCOR) 40 MG tablet Take 40 mg by mouth at bedtime. 01/17/16   [provider]  Syringe/Needle, Disp, (SYRINGE 3CC/25GX1") 25G X 1" 3 ML MISC 1 Syringe by Does not apply route every 30 (thirty) days. 07/12/22   Earna Coder, MD  thiamine (VITAMIN B-1) 100 MG tablet Take 1 tablet (100 mg total) by mouth daily. 10/10/23   Marcelino Duster, MD    Physical Exam: Vitals:   02/06/24 1900 02/06/24 1930 02/06/24 2000 02/06/24 2100  BP: 118/69 113/64 101/66 111/66  Pulse: (!) 107 (!) 108 86 (!) 114  Resp: 19  19 19   Temp:    98.7 F (37.1 C)  TempSrc:    Oral  SpO2: 93% 96% 91% 94%  Weight:      Height:       General: Patient has moderate acute respiratory distress HEENT:       Eyes: PERRL, EOMI, no jaundice       ENT: No discharge from the ears and nose, no pharynx injection, no tonsillar enlargement.        Neck: No JVD, no bruit, no mass felt. Heme: No neck lymph node enlargement. Cardiac: S1/S2, RRR, No murmurs, No gallops or rubs. Respiratory: Has decreased air movement bilaterally, with mild wheezing bilaterally GI: Soft, nondistended, nontender, no rebound pain, no organomegaly, BS present. GU: No hematuria Ext: No pitting leg edema bilaterally. 1+DP/PT pulse bilaterally. Musculoskeletal: No joint deformities, No joint redness or warmth, no limitation of ROM in spin. Skin: No rashes.  Neuro: Alert, oriented X3, cranial nerves II-XII grossly intact, moves all extremities normally.  Psych: Patient is not psychotic, no suicidal or hemocidal ideation.  Labs on Admission: I have personally reviewed following labs and imaging studies  CBC: Recent Labs  Lab 02/04/24 1546 02/06/24 1730  WBC 10.7* 14.5*  NEUTROABS  --  12.8*  HGB 10.0* 9.3*  HCT 33.3* 30.7*  MCV 88.1 85.8  PLT 241 249   Basic Metabolic Panel: Recent Labs  Lab 02/06/24 1730  NA 136  K 4.0  CL 100  CO2 24  GLUCOSE 115*  BUN 15  CREATININE 1.14  CALCIUM 8.6*    GFR: Estimated Creatinine Clearance: 57.2 mL/min (by C-G formula based on SCr of 1.14 mg/dL). Liver Function Tests: Recent Labs  Lab 02/06/24 1730  AST 15  ALT 11  ALKPHOS 89  BILITOT 0.6  PROT 6.6  ALBUMIN 3.1*   No results for input(s): "LIPASE", "AMYLASE" in the last 168 hours. No results for input(s): "AMMONIA" in the last 168 hours. Coagulation Profile: No results for input(s): "INR", "PROTIME" in the last 168 hours. Cardiac Enzymes: No results for input(s): "CKTOTAL", "CKMB", "CKMBINDEX", "TROPONINI" in the last 168 hours. BNP (last 3 results) No results for input(s): "PROBNP" in the last 8760 hours. HbA1C: No results for input(s): "HGBA1C" in the last 72 hours. CBG: Recent Labs  Lab 02/06/24 2115  GLUCAP 245*   Lipid Profile: No results for input(s): "CHOL", "HDL", "LDLCALC", "TRIG", "CHOLHDL", "LDLDIRECT" in the last 72 hours. Thyroid Function Tests: No results for input(s): "TSH", "T4TOTAL", "FREET4", "T3FREE", "THYROIDAB" in the last 72 hours. Anemia Panel: Recent Labs    02/04/24 1546  VITAMINB12 744  FOLATE >40.0  FERRITIN 30  TIBC 388  IRON 28*   Urine analysis:    Component Value Date/Time   COLORURINE YELLOW (A) 07/02/2023 1406   APPEARANCEUR CLEAR (A) 07/02/2023 1406   APPEARANCEUR Clear 03/24/2013 1853   LABSPEC 1.009 07/02/2023 1406   LABSPEC 1.020 03/24/2013 1853   PHURINE 6.0 07/02/2023 1406   GLUCOSEU NEGATIVE 07/02/2023 1406   GLUCOSEU Negative 03/24/2013 1853   HGBUR LARGE (A) 07/02/2023 1406   BILIRUBINUR NEGATIVE 07/02/2023 1406   BILIRUBINUR Negative 03/24/2013 1853   KETONESUR NEGATIVE 07/02/2023 1406   PROTEINUR NEGATIVE 07/02/2023 1406   NITRITE NEGATIVE 07/02/2023 1406   LEUKOCYTESUR NEGATIVE 07/02/2023 1406   LEUKOCYTESUR Negative 03/24/2013 1853   Sepsis Labs: @LABRCNTIP (procalcitonin:4,lacticidven:4) ) Recent Results (from the past 240 hours)  Resp panel by RT-PCR (RSV, Flu A&B, Covid) Anterior Nasal Swab      Status: None   Collection Time: 02/06/24  5:31 PM   Specimen: Anterior Nasal Swab  Result Value Ref Range Status   SARS Coronavirus 2 by RT PCR NEGATIVE NEGATIVE Final    Comment: (NOTE) SARS-CoV-2 target nucleic acids are NOT DETECTED.  The SARS-CoV-2 RNA is generally detectable in upper respiratory specimens during the acute phase of infection. The lowest concentration of SARS-CoV-2 viral copies this assay can detect is 138 copies/mL. A negative result does not preclude SARS-Cov-2 infection and should not be used as the sole basis for treatment or other patient management decisions. A negative result may occur with  improper specimen collection/handling, submission of specimen other than nasopharyngeal swab, presence of viral mutation(s) within the areas targeted by this assay, and inadequate number of viral copies(<138 copies/mL). A negative result must be combined with clinical observations, patient history, and epidemiological information. The expected result is Negative.  Fact Sheet for Patients:  BloggerCourse.com  Fact Sheet for Healthcare Providers:  SeriousBroker.it  This test is no t yet approved or cleared by the [place] FDA and  has been authorized for detection and/or diagnosis of SARS-CoV-2 by FDA under an Emergency Use Authorization (EUA). This EUA will remain  in effect (meaning this test can be used) for the duration of the COVID-19 declaration under Section 564(b)(1) of the Act, 21 U.S.C.section 360bbb-3(b)(1), unless the authorization is terminated  or revoked sooner.       Influenza A by PCR NEGATIVE NEGATIVE Final   Influenza B by PCR NEGATIVE NEGATIVE Final    Comment: (NOTE) The Xpert Xpress SARS-CoV-2/FLU/RSV plus assay is intended as an aid in the diagnosis of influenza from Nasopharyngeal swab specimens and should not be used as a sole basis for  treatment. Nasal washings and aspirates are  unacceptable for Xpert Xpress SARS-CoV-2/FLU/RSV testing.  Fact Sheet for Patients: BloggerCourse.com  Fact Sheet for Healthcare Providers: SeriousBroker.it  This test is not yet approved or cleared by the [place] FDA and has been authorized for detection and/or diagnosis of SARS-CoV-2 by FDA under an Emergency Use Authorization (EUA). This EUA will remain in effect (meaning this test can be used) for the duration of the COVID-19 declaration under Section 564(b)(1) of the Act, 21 U.S.C. section 360bbb-3(b)(1), unless the authorization is terminated or revoked.     Resp Syncytial Virus by PCR NEGATIVE NEGATIVE Final    Comment: (NOTE) Fact Sheet for Patients: BloggerCourse.com  Fact Sheet for Healthcare Providers: SeriousBroker.it  This test is not yet approved or cleared by the [place] FDA and has been authorized for detection and/or diagnosis of SARS-CoV-2 by FDA under an Emergency Use Authorization (EUA). This EUA will remain in effect (meaning this test can be used) for the duration of the COVID-19 declaration under Section 564(b)(1) of the Act, 21 U.S.C. section 360bbb-3(b)(1), unless the authorization is terminated or revoked.  Performed at [facility], [address] [place], [place] [address]      Radiological Exams on Admission:   Assessment/Plan Principal Problem:   COPD exacerbation (HCC) Active Problems:   Acute on chronic respiratory failure with hypoxia (HCC)   Chronic heart failure with preserved ejection fraction (HFpEF) (HCC)   HLD (hyperlipidemia)   Type II diabetes mellitus with renal manifestations (HCC)   Hypertension   CKD (chronic kidney disease) stage 2, GFR 60-89 ml/min   Chronic pain syndrome   Depression with anxiety   TIA (transient ischemic attack)   Overweight (BMI 25.0-29.9)   Assessment and Plan:  Acute on  chronic respiratory failure with hypoxia due to COPD exacerbation [facility]): Patient has increased oxygen requirement from 2-3 litter of oxygen to 4 L oxygen now.  Initially required 6 L of oxygen and BiPAP.  -will admit patient to telemetry bed  -Bronchodilators and prn Mucinex -Solu-Medrol 80 mg IV daily after given 125 mg of Solu-Medrol -Oral doxycycline 100 mg twice daily -Incentive spirometry -check RVP -Follow up sputum culture  Chronic diastolic CHF (congestive heart failure) (HCC): Today: 2/33/22 showed EF of 55-60% with grade 2 diastolic dysfunction.  Patient has elevated BNP to 15, but no leg edema, does not seem to have CHF exacerbation. -Watch volume status closely   Type 2 diabetes mellitus with foot ulcer (CODE) (HCC): Recent A1c 4.9, well-controlled.  Patient is taking metformin -SSI   Hypertension -Metoprolol -IV hydralazine as needed   HLD (hyperlipidemia) -Zocor and zetia   Chronic pain syndrome -Continue home Suboxone and Neurontin   Depression with anxiety -Continue home medications   TIA (transient ischemic attack) -Zocor and Zetia -Plavix and ASA  CKD (chronic kidney disease) stage 2, GFR 60-89 ml/min: Renal function stable.  GFR> 60 today -Follow-up with BMP  Overweight (BMI 25.0-29.9): Body weight 79 kg, BMI 27.28 -Encourage losing weight -Exercise and healthy diet       DVT ppx: SQ Lovenox  Code Status: Full code   Family Communication:     not done, no family member is at bed side.    Disposition Plan:  Anticipate discharge back to previous environment  Consults called:  none  Admission status and Level of care: Progressive: as inpt        Dispo: The patient is from: Home  Anticipated d/c is to: Home              Anticipated d/c date is: 2 days              Patient currently is not medically stable to d/c.     Severity of Illness:  The appropriate patient status for this patient is INPATIENT. Inpatient status is  judged to be reasonable and necessary in order to provide the required intensity of service to ensure the patient's safety. The patient's presenting symptoms, physical exam findings, and initial radiographic and laboratory data in the context of their chronic comorbidities is felt to place them at high risk for further clinical deterioration. Furthermore, it is not anticipated that the patient will be medically stable for discharge from the hospital within 2 midnights of admission.   * I certify that at the point of admission it is my clinical judgment that the patient will require inpatient hospital care spanning beyond 2 midnights from the point of admission due to high intensity of service, high risk for further deterioration and high frequency of surveillance required.*       Date of Service 02/06/2024    Lorretta Harp [facility]   If 7PM-7AM, please contact night-coverage [email or web address] 02/06/2024, 10:49 PM

## 2024-02-06 NOTE — ED Triage Notes (Addendum)
 Pt to ed from home via [facility] for SOB since yesterday. Pt states "I have it all the time but worse since yesterday". Pt is caox4 and in mild respiratory distress.   18G RAC 75% on RA 6 L on [place] to 85%  After 2 duenebs now 100% on 4 liters.  Vitals WNL per EMS>

## 2024-02-07 DIAGNOSIS — J441 Chronic obstructive pulmonary disease with (acute) exacerbation: Secondary | ICD-10-CM | POA: Diagnosis not present

## 2024-02-07 LAB — EXPECTORATED SPUTUM ASSESSMENT W GRAM STAIN, RFLX TO RESP C

## 2024-02-07 LAB — CBC
HCT: 29.2 % — ABNORMAL LOW (ref 39.0–52.0)
Hemoglobin: 8.8 g/dL — ABNORMAL LOW (ref 13.0–17.0)
MCH: 26.4 pg (ref 26.0–34.0)
MCHC: 30.1 g/dL (ref 30.0–36.0)
MCV: 87.7 fL (ref 80.0–100.0)
Platelets: 247 10*3/uL (ref 150–400)
RBC: 3.33 MIL/uL — ABNORMAL LOW (ref 4.22–5.81)
RDW: 16.2 % — ABNORMAL HIGH (ref 11.5–15.5)
WBC: 14.3 10*3/uL — ABNORMAL HIGH (ref 4.0–10.5)
nRBC: 0 % (ref 0.0–0.2)

## 2024-02-07 LAB — RESPIRATORY PANEL BY PCR

## 2024-02-07 LAB — BASIC METABOLIC PANEL
Anion gap: 13 (ref 5–15)
BUN: 23 mg/dL (ref 8–23)
CO2: 24 mmol/L (ref 22–32)
Calcium: 8.8 mg/dL — ABNORMAL LOW (ref 8.9–10.3)
Chloride: 99 mmol/L (ref 98–111)
Creatinine, Ser: 1.51 mg/dL — ABNORMAL HIGH (ref 0.61–1.24)
GFR, Estimated: 50 mL/min — ABNORMAL LOW (ref >60)
Glucose, Bld: 383 mg/dL — ABNORMAL HIGH (ref 70–99)
Potassium: 3.4 mmol/L — ABNORMAL LOW (ref 3.5–5.1)
Sodium: 136 mmol/L (ref 135–145)

## 2024-02-07 LAB — CBG MONITORING, ED
Glucose-Capillary: 316 mg/dL — ABNORMAL HIGH (ref 70–99)
Glucose-Capillary: 256 mg/dL — ABNORMAL HIGH (ref 70–99)
Glucose-Capillary: 204 mg/dL — ABNORMAL HIGH (ref 70–99)

## 2024-02-07 LAB — MAGNESIUM: Magnesium: 1.6 mg/dL — ABNORMAL LOW (ref 1.7–2.4)

## 2024-02-07 LAB — GLUCOSE, CAPILLARY: Glucose-Capillary: 239 mg/dL — ABNORMAL HIGH (ref 70–99)

## 2024-02-07 MED ORDER — METHYLPREDNISOLONE SODIUM SUCC 40 MG IJ SOLR
40.0000 mg | Freq: Every day | INTRAMUSCULAR | Status: DC
  Administered 2024-02-07 – 2024-02-09 (×3): 40 mg via INTRAVENOUS
  Filled 2024-02-07 (×3): qty 1

## 2024-02-07 MED ORDER — POTASSIUM CHLORIDE CRYS ER 20 MEQ PO TBCR
40.0000 meq | EXTENDED_RELEASE_TABLET | Freq: Two times a day (BID) | ORAL | Status: AC
  Administered 2024-02-07 (×2): 40 meq via ORAL
  Filled 2024-02-07 (×2): qty 2

## 2024-02-07 NOTE — Progress Notes (Signed)
 PROGRESS NOTE  Christian Fields    DOB: [birth year], 68 y.o.  QIO:[id number]    Code Status: Full Code   DOA: 02/06/2024   LOS: 1   Brief hospital course  Christian Fields is a 68 y.o. male with medical history significant of  COPD on 2-3 L oxygen, hypertension, hyperlipidemia, diabetes mellitus, asthma, TIA, GERD, gout, depression, OSA not on CPAP, anemia, AAA, polysubstance abuse, chronic pain syndrome on Suboxone, non-small cell lung cancer (s/p of RXT), CKD-2, who presents with SOB.  Per report, patient was found to have oxygen desaturation to 75% when patient was not wearing home oxygen), initially started on 6L oxygen with short period of needing bipap.   ED Course:WBC 14.5, negative PCR for COVID, flu and RSV, lactic acid 1.3, troponin 9 x 2, GFR> 60.  Temperature 99.9, blood pressure 102/66, heart rate of 106, RR 23.    Chest x-ray: Hyperinflation with chronic changes. Enlarged presumed pulmonary arteries with fullness of the lung hila. Stable spiculated left upper lobe lung nodule.    EKG: Sinus rhythm, QTc 403, low voltage, LAD, poor R wave progression   02/07/24 -stable. Wants to go home.   Assessment & Plan  Principal Problem:   COPD exacerbation (HCC) Active Problems:   Acute on chronic respiratory failure with hypoxia (HCC)   Chronic heart failure with preserved ejection fraction (HFpEF) (HCC)   HLD (hyperlipidemia)   Type II diabetes mellitus with renal manifestations (HCC)   Hypertension   CKD (chronic kidney disease) stage 2, GFR 60-89 ml/min   Chronic pain syndrome   Depression with anxiety   TIA (transient ischemic attack)   Overweight (BMI 25.0-29.9)  Acute on chronic respiratory failure with hypoxia due to COPD exacerbation [facility]): Patient has increased oxygen requirement from 2-3 liter. Now on 6L. Negative RVP. Expectorant culture still pending - continue steroids, Abx, breathing treatments for COPD exacerbation - wean O2 to baseline of 3 as tolerated and then  ambulate with pulse ox to verify he will be able to go home safely on this regimen   Chronic diastolic CHF: Today: 2/33/22 showed EF of 55-60% with grade 2 diastolic dysfunction.  Patient has elevated BNP to 15, but no leg edema, does not seem to have CHF exacerbation. -Watch volume status closely   Type 2 diabetes mellitus with foot ulcer (CODE) (HCC): Recent A1c 4.9, well-controlled.  Patient is taking metformin -SSI   Hypertension -Metoprolol   HLD (hyperlipidemia) -Zocor and zetia   Chronic pain syndrome -Continue home Suboxone and Neurontin   Depression with anxiety -Continue home medications   TIA (transient ischemic attack) -Zocor and Zetia -Plavix and ASA   CKD (chronic kidney disease) stage 2, GFR 60-89 ml/min: Renal function stable.  GFR> 60 today -Follow-up with BMP   Overweight (BMI 25.0-29.9): Body weight 79 kg, BMI 27.28 -Encourage losing weight -Exercise and healthy diet -   Body mass index is 27.28 kg/m.  VTE ppx: enoxaparin (LOVENOX) injection 40 mg Start: 02/06/24 2200   Diet:     Diet   Diet heart healthy/carb modified Room service appropriate? Yes; Fluid consistency: Thin   Consultants: None   Subjective 02/07/24    Pt reports feeling well. Denies SOB at rest. Has not been up and walking yet   Objective   Vitals:   02/07/24 0100 02/07/24 0300 02/07/24 0500 02/07/24 0600  BP:  (!) 102/58 (!) 116/101 (!) 108/58  Pulse:  (!) 120 (!) 112 (!) 107  Resp:  15 (!) 22  18  Temp: 98.3 F (36.8 C)  98.3 F (36.8 C)   TempSrc: Oral  Oral   SpO2:  95% (!) 87% 94%  Weight:      Height:       No intake or output data in the 24 hours ending 02/07/24 0807 Filed Weights   02/06/24 1728  Weight: 79 kg    Physical Exam:  General: awake, alert, NAD HEENT: atraumatic, clear conjunctiva, anicteric sclera, MMM, hearing grossly normal Respiratory: normal respiratory effort. Mild wheezing in upper airways and minimal air movement in  bases Cardiovascular: quick capillary refill, normal S1/S2, RRR, no JVD, murmurs Gastrointestinal: soft, NT, ND Nervous: A&O x3. no gross focal neurologic deficits, normal speech Extremities: moves all equally, no edema, normal tone Skin: dry, intact, normal temperature, normal color. No rashes, lesions or ulcers on exposed skin Psychiatry: normal mood, congruent affect  Labs   I have personally reviewed the following labs and imaging studies CBC    Component Value Date/Time   WBC 14.3 (H) 02/07/2024 0244   RBC 3.33 (L) 02/07/2024 0244   HGB 8.8 (L) 02/07/2024 0244   HGB 10.0 (L) 02/04/2024 1546   HGB 13.9 05/17/2014 1424   HCT 29.2 (L) 02/07/2024 0244   HCT 44.6 03/31/2014 1815   PLT 247 02/07/2024 0244   PLT 241 02/04/2024 1546   PLT 261 03/31/2014 1815   MCV 87.7 02/07/2024 0244   MCV 96 03/31/2014 1815   MCH 26.4 02/07/2024 0244   MCHC 30.1 02/07/2024 0244   RDW 16.2 (H) 02/07/2024 0244   RDW 13.7 03/31/2014 1815   LYMPHSABS 1.2 02/06/2024 1730   LYMPHSABS 0.6 (L) 09/25/2013 0356   MONOABS 0.3 02/06/2024 1730   MONOABS 0.1 (L) 09/25/2013 0356   EOSABS 0.1 02/06/2024 1730   EOSABS 0.0 09/25/2013 0356   BASOSABS 0.1 02/06/2024 1730   BASOSABS 0.0 09/25/2013 0356      Latest Ref Rng & Units 02/07/2024    2:44 AM 02/06/2024    5:30 PM 10/19/2023    5:49 AM  BMP  Glucose 70 - 99 mg/dL 161  096  045   BUN 8 - 23 mg/dL 23  15  32   Creatinine 0.61 - 1.24 mg/dL 4.09  8.11  9.14   Sodium 135 - 145 mmol/L 136  136  137   Potassium 3.5 - 5.1 mmol/L 3.4  4.0  3.9   Chloride 98 - 111 mmol/L 99  100  96   CO2 22 - 32 mmol/L 24  24  32   Calcium 8.9 - 10.3 mg/dL 8.8  8.6  8.7     DG Chest Port 1 View Result Date: 02/06/2024 CLINICAL DATA:  Shortness of breath EXAM: PORTABLE CHEST 1 VIEW COMPARISON:  X-ray and CT 10/16/2023 FINDINGS: The left inferior costophrenic angle is clipped off the edge of the film. Hyperinflation with chronic lung changes. There is a spiculated nodular  focus once again left upper lobe. Please correlate with prior CT scan. Otherwise no consolidation, pneumothorax, effusion or edema. Normal cardiopericardial silhouette with calcified aorta. There is fullness of both hila which are unchanged from previous. These could be enlarged pulmonary arteries based on the prior CT scan IMPRESSION: Hyperinflation with chronic changes. Enlarged presumed pulmonary arteries with fullness of the lung hila. Stable spiculated left upper lobe lung nodule. Please correlate with prior CT. Electronically Signed   By: Karen Kays M.D.   On: 02/06/2024 18:14   VAS [place] ABI WITH/WO TBI Result Date:  02/06/2024  LOWER EXTREMITY DOPPLER STUDY Patient Name:  MARSHALL KAMPF  Date of Exam:   02/05/2024 Medical Rec #: [id number]       Accession #:    [id number] Date of Birth: 1956/10/03       Patient Gender: M Patient Age:   61 years Exam Location:  [facility] Procedure:      VAS [place] ABI WITH/WO TBI Referring Phys: --------------------------------------------------------------------------------  Indications: Peripheral artery disease, and Pain swerlling.  Vascular Interventions: 07/03/2023 Percutaneous transluminal angioplasty of left                         SFA with 6 mm diameter by 15 cm length Lutonix                         drug-coated angioplasty balloon                         5. Stent placement to the left SFA with 7 mm diameter by                         10 cm length life stent. Comparison Study: 11/2020 Performing Technologist: Salvadore Farber RVT  Examination Guidelines: A complete evaluation includes at minimum, Doppler waveform signals and systolic blood pressure reading at the level of bilateral brachial, anterior tibial, and posterior tibial arteries, when vessel segments are accessible. Bilateral testing is considered an integral part of a complete examination. Photoelectric Plethysmograph (PPG) waveforms and toe systolic pressure readings are included as required and  additional duplex testing as needed. Limited examinations for reoccurring indications may be performed as noted.  ABI Findings: +---------+------------------+-----+---------+--------+ Right    Rt Pressure (mmHg)IndexWaveform Comment  +---------+------------------+-----+---------+--------+ Brachial 129                                      +---------+------------------+-----+---------+--------+ PTA      143               1.10 biphasic          +---------+------------------+-----+---------+--------+ DP       152               1.17 triphasic         +---------+------------------+-----+---------+--------+ Great Toe92                0.71 Abnormal          +---------+------------------+-----+---------+--------+ +---------+------------------+-----+---------+-------+ Left     Lt Pressure (mmHg)IndexWaveform Comment +---------+------------------+-----+---------+-------+ Brachial 130                                     +---------+------------------+-----+---------+-------+ PTA      141               1.08 triphasic        +---------+------------------+-----+---------+-------+ DP       140               1.08 triphasic        +---------+------------------+-----+---------+-------+ Great Toe103               0.79 Abnormal         +---------+------------------+-----+---------+-------+ +-------+-----------+-----------+------------+------------+ ABI/TBIToday's ABIToday's TBIPrevious ABIPrevious TBI +-------+-----------+-----------+------------+------------+ Right  1.17       .  71        1.13        1.21         +-------+-----------+-----------+------------+------------+ Left   1.08       .79        1.01        .92          +-------+-----------+-----------+------------+------------+ Bilateral ABIs appear essentially unchanged compared to prior study on 2022.  Summary: Right: Resting right ankle-brachial index is within normal range. The right toe-brachial  index is normal. Left: Resting left ankle-brachial index is within normal range. The left toe-brachial index is normal. *See table(s) above for measurements and observations.  Electronically signed by Festus Barren MD on 02/06/2024 at 11:53:43 AM.    Final    VAS [place] AAA DUPLEX Result Date: 02/06/2024 ABDOMINAL AORTA STUDY Patient Name:  EMMERT ROETHLER  Date of Exam:   02/05/2024 Medical Rec #: [id number]       Accession #:    [id number] Date of Birth: Apr 03, 1956       Patient Gender: M Patient Age:   47 years Exam Location:  [facility] Procedure:      VAS [place] AAA DUPLEX Referring Phys: Festus Barren --------------------------------------------------------------------------------  Comparison Study: CT from 2022 = 3.8cm distal AAA Performing Technologist: Salvadore Farber RVT  Examination Guidelines: A complete evaluation includes B-mode imaging, spectral Doppler, color Doppler, and power Doppler as needed of all accessible portions of each vessel. Bilateral testing is considered an integral part of a complete examination. Limited examinations for reoccurring indications may be performed as noted.  Abdominal Aorta Findings: +-----------+-------+----------+----------+--------+--------+--------+ Location   AP (cm)Trans (cm)PSV (cm/s)WaveformThrombusComments +-----------+-------+----------+----------+--------+--------+--------+ Proximal   2.58   2.47      63                                 +-----------+-------+----------+----------+--------+--------+--------+ Mid        2.11   1.87      51                                 +-----------+-------+----------+----------+--------+--------+--------+ Distal     3.90   3.73      40                                 +-----------+-------+----------+----------+--------+--------+--------+ RT CIA Prox1.2    1.2       163                                +-----------+-------+----------+----------+--------+--------+--------+ LT CIA Prox1.3    1.2        106                                +-----------+-------+----------+----------+--------+--------+--------+  Summary: Abdominal Aorta: There 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 The largest aortic measurement is 3.9 cm. The largest aortic diameter has increased compared to prior exam. Previous diameter measurement was 3.8 cm obtained on 02/2021 CT.  *See table(s) above for measurements and observations.  Electronically signed by Festus Barren MD on 02/06/2024 at 11:53:34 AM.    Final     Disposition Plan & Communication  Patient status: Inpatient  Admitted From: Home Planned disposition location: Home Anticipated discharge date: 3/2 pending respiratory status improvement   Family Communication: none at bedside    Author: Leeroy Bock, DO [facility] 02/07/2024, 8:07 AM   Available by Epic secure chat 7AM-7PM. If 7PM-7AM, please contact night-coverage.  [name] contact information found on [email or web address].

## 2024-02-07 NOTE — Progress Notes (Signed)
 Pt arrived to unit via ED RN transport. Pt alert and oriented to person, place, time and situation. Urinal provided. Call bell within reach. Bed locked in lowest position and bed alarm activated.

## 2024-02-07 NOTE — ED Notes (Signed)
 Pt requested a d

## 2024-02-08 DIAGNOSIS — J441 Chronic obstructive pulmonary disease with (acute) exacerbation: Secondary | ICD-10-CM | POA: Diagnosis not present

## 2024-02-08 LAB — GLUCOSE, CAPILLARY
Glucose-Capillary: 144 mg/dL — ABNORMAL HIGH (ref 70–99)
Glucose-Capillary: 137 mg/dL — ABNORMAL HIGH (ref 70–99)

## 2024-02-08 MED ORDER — IPRATROPIUM-ALBUTEROL 0.5-2.5 (3) MG/3ML IN SOLN
3.0000 mL | Freq: Two times a day (BID) | RESPIRATORY_TRACT | Status: DC
  Administered 2024-02-09: 3 mL via RESPIRATORY_TRACT
  Filled 2024-02-08: qty 3

## 2024-02-08 MED ORDER — HYDROXYZINE HCL 10 MG PO TABS
10.0000 mg | ORAL_TABLET | Freq: Once | ORAL | Status: AC
  Administered 2024-02-08: 10 mg via ORAL
  Filled 2024-02-08: qty 1

## 2024-02-08 NOTE — Plan of Care (Signed)

## 2024-02-08 NOTE — Plan of Care (Signed)
 Problem: Education: Goal: Ability to describe self-care measures that may prevent or decrease complications (Diabetes Survival Skills Education) will improve 02/08/2024 1740 by Aura Dials, RN Outcome: Progressing 02/08/2024 1606 by Aura Dials, RN Outcome: Progressing Goal: Individualized Educational Video(s) 02/08/2024 1740 by Aura Dials, RN Outcome: Progressing 02/08/2024 1606 by Aura Dials, RN Outcome: Progressing   Problem: Coping: Goal: Ability to adjust to condition or change in health will improve 02/08/2024 1740 by Aura Dials, RN Outcome: Progressing 02/08/2024 1606 by Aura Dials, RN Outcome: Progressing   Problem: Fluid Volume: Goal: Ability to maintain a balanced intake and output will improve 02/08/2024 1740 by Aura Dials, RN Outcome: Progressing 02/08/2024 1606 by Aura Dials, RN Outcome: Progressing   Problem: Health Behavior/Discharge Planning: Goal: Ability to identify and utilize available resources and services will improve 02/08/2024 1740 by Aura Dials, RN Outcome: Progressing 02/08/2024 1606 by Aura Dials, RN Outcome: Progressing Goal: Ability to manage health-related needs will improve 02/08/2024 1740 by Aura Dials, RN Outcome: Progressing 02/08/2024 1606 by Aura Dials, RN Outcome: Progressing   Problem: Metabolic: Goal: Ability to maintain appropriate glucose levels will improve 02/08/2024 1740 by Aura Dials, RN Outcome: Progressing 02/08/2024 1606 by Aura Dials, RN Outcome: Progressing   Problem: Nutritional: Goal: Maintenance of adequate nutrition will improve 02/08/2024 1740 by Aura Dials, RN Outcome: Progressing 02/08/2024 1606 by Aura Dials, RN Outcome: Progressing Goal: Progress toward achieving an optimal weight will improve 02/08/2024 1740 by Aura Dials, RN Outcome: Progressing 02/08/2024 1606 by Aura Dials, RN Outcome: Progressing   Problem: Skin Integrity: Goal: Risk for  impaired skin integrity will decrease 02/08/2024 1740 by Aura Dials, RN Outcome: Progressing 02/08/2024 1606 by Aura Dials, RN Outcome: Progressing   Problem: Tissue Perfusion: Goal: Adequacy of tissue perfusion will improve 02/08/2024 1740 by Aura Dials, RN Outcome: Progressing 02/08/2024 1606 by Aura Dials, RN Outcome: Progressing   Problem: Education: Goal: Knowledge of General Education information will improve Description: Including pain rating scale, medication(s)/side effects and non-pharmacologic comfort measures 02/08/2024 1740 by Aura Dials, RN Outcome: Progressing 02/08/2024 1606 by Aura Dials, RN Outcome: Progressing   Problem: Health Behavior/Discharge Planning: Goal: Ability to manage health-related needs will improve 02/08/2024 1740 by Aura Dials, RN Outcome: Progressing 02/08/2024 1606 by Aura Dials, RN Outcome: Progressing   Problem: Clinical Measurements: Goal: Ability to maintain clinical measurements within normal limits will improve 02/08/2024 1740 by Aura Dials, RN Outcome: Progressing 02/08/2024 1606 by Aura Dials, RN Outcome: Progressing Goal: Will remain free from infection 02/08/2024 1740 by Aura Dials, RN Outcome: Progressing 02/08/2024 1606 by Aura Dials, RN Outcome: Progressing Goal: Diagnostic test results will improve 02/08/2024 1740 by Aura Dials, RN Outcome: Progressing 02/08/2024 1606 by Aura Dials, RN Outcome: Progressing Goal: Respiratory complications will improve 02/08/2024 1740 by Aura Dials, RN Outcome: Progressing 02/08/2024 1606 by Aura Dials, RN Outcome: Progressing Goal: Cardiovascular complication will be avoided 02/08/2024 1740 by Aura Dials, RN Outcome: Progressing 02/08/2024 1606 by Aura Dials, RN Outcome: Progressing   Problem: Activity: Goal: Risk for activity intolerance will decrease 02/08/2024 1740 by Aura Dials, RN Outcome: Progressing 02/08/2024 1606  by Aura Dials, RN Outcome: Progressing   Problem: Nutrition: Goal: Adequate nutrition will be maintained 02/08/2024 1740 by Aura Dials, RN Outcome: Progressing 02/08/2024 1606 by Aura Dials, RN Outcome: Progressing   Problem: Coping: Goal: Level of anxiety will decrease 02/08/2024 1740 by Aura Dials, RN Outcome: Progressing 02/08/2024 1606 by Aura Dials, RN Outcome: Progressing   Problem: Elimination: Goal: Will not experience complications related to bowel motility 02/08/2024 1740  by Aura Dials, RN Outcome: Progressing 02/08/2024 1606 by Aura Dials, RN Outcome: Progressing Goal: Will not experience complications related to urinary retention 02/08/2024 1740 by Aura Dials, RN Outcome: Progressing 02/08/2024 1606 by Aura Dials, RN Outcome: Progressing   Problem: Pain Managment: Goal: General experience of comfort will improve and/or be controlled 02/08/2024 1740 by Aura Dials, RN Outcome: Progressing 02/08/2024 1606 by Aura Dials, RN Outcome: Progressing   Problem: Safety: Goal: Ability to remain free from injury will improve 02/08/2024 1740 by Aura Dials, RN Outcome: Progressing 02/08/2024 1606 by Aura Dials, RN Outcome: Progressing   Problem: Skin Integrity: Goal: Risk for impaired skin integrity will decrease 02/08/2024 1740 by Aura Dials, RN Outcome: Progressing 02/08/2024 1606 by Aura Dials, RN Outcome: Progressing   Problem: Education: Goal: Knowledge of disease or condition will improve 02/08/2024 1740 by Aura Dials, RN Outcome: Progressing 02/08/2024 1606 by Aura Dials, RN Outcome: Progressing Goal: Knowledge of the prescribed therapeutic regimen will improve 02/08/2024 1740 by Aura Dials, RN Outcome: Progressing 02/08/2024 1606 by Aura Dials, RN Outcome: Progressing Goal: Individualized Educational Video(s) 02/08/2024 1740 by Aura Dials, RN Outcome: Progressing 02/08/2024 1606 by Aura Dials, RN Outcome: Progressing   Problem: Activity: Goal: Ability to tolerate increased activity will improve 02/08/2024 1740 by Aura Dials, RN Outcome: Progressing 02/08/2024 1606 by Aura Dials, RN Outcome: Progressing Goal: Will verbalize the importance of balancing activity with adequate rest periods 02/08/2024 1740 by Aura Dials, RN Outcome: Progressing 02/08/2024 1606 by Aura Dials, RN Outcome: Progressing   Problem: Respiratory: Goal: Ability to maintain a clear airway will improve 02/08/2024 1740 by Aura Dials, RN Outcome: Progressing 02/08/2024 1606 by Aura Dials, RN Outcome: Progressing Goal: Levels of oxygenation will improve 02/08/2024 1740 by Aura Dials, RN Outcome: Progressing 02/08/2024 1606 by Aura Dials, RN Outcome: Progressing Goal: Ability to maintain adequate ventilation will improve 02/08/2024 1740 by Aura Dials, RN Outcome: Progressing 02/08/2024 1606 by Aura Dials, RN Outcome: Progressing

## 2024-02-08 NOTE — Progress Notes (Addendum)
 PROGRESS NOTE Christian Fields    DOB: [birth year], 68 y.o.  WNU:[id number]    Code Status: Full Code   DOA: 02/06/2024   LOS: 2  Brief hospital course  Christian Fields is a 68 y.o. male with medical history significant of  COPD on 2-3 L oxygen, hypertension, hyperlipidemia, diabetes mellitus, asthma, TIA, GERD, gout, depression, OSA not on CPAP, anemia, AAA, polysubstance abuse, chronic pain syndrome on Suboxone, non-small cell lung cancer (s/p of RXT), CKD-2, who presents with SOB.  Per report, patient was found to have oxygen desaturation to 75% when patient was not wearing home oxygen), initially started on 6L oxygen with short period of needing bipap.   ED Course:WBC 14.5, negative PCR for COVID, flu and RSV, lactic acid 1.3, troponin 9 x 2, GFR> 60.  Temperature 99.9, blood pressure 102/66, heart rate of 106, RR 23.    Chest x-ray: Hyperinflation with chronic changes. Enlarged presumed pulmonary arteries with fullness of the lung hila. Stable spiculated left upper lobe lung nodule.    EKG: Sinus rhythm, QTc 403, low voltage, LAD, poor R wave progression  02/08/24 -stable. Wants to go home. Unfortunately, still desats to less than 90% while ambulating on increased O2 from baseline.   Assessment & Plan  Principal Problem:   COPD exacerbation (HCC) Active Problems:   Acute on chronic respiratory failure with hypoxia (HCC)   Chronic heart failure with preserved ejection fraction (HFpEF) (HCC)   HLD (hyperlipidemia)   Type II diabetes mellitus with renal manifestations (HCC)   Hypertension   CKD (chronic kidney disease) stage 2, GFR 60-89 ml/min   Chronic pain syndrome   Depression with anxiety   TIA (transient ischemic attack)   Overweight (BMI 25.0-29.9)  Acute on chronic respiratory failure with hypoxia due to COPD exacerbation [facility]): Patient has increased oxygen requirement from 2-3 liter. Now on 5-6L. Negative RVP. Expectorant culture still pending Required up to 6L while  ambulating today. Will plan another day inpatient and possible dc tomorrow - continue steroids, Abx, breathing treatments for COPD exacerbation - wean O2 to baseline of 3 as tolerated and then ambulate with pulse ox to verify he will be able to go home safely on this regimen.  H/o NSCLC- follows with Dr. Smith Robert - continue with screening CT chest end of this month. Complete sooner if unable to wean   Chronic diastolic CHF: echo 01/11/21 showed EF of 55-60% with grade 2 diastolic dysfunction.  Patient has elevated BNP to 15, but no leg edema, does not seem to have CHF exacerbation. -Watch volume status closely   Type 2 diabetes mellitus with foot ulcer (HCC): Recent A1c 4.9, well-controlled.  Patient is taking metformin -SSI cancel due to well controlled BS even on steroids  AAA- vascular US shows that AAA diameter has decreased in interval imaging.    Hypertension -Metoprolol   Chronic pain syndrome -Continue home Suboxone and Neurontin   Depression with anxiety -Continue home medications   TIA  HLD- ABI was normal this admission -Zocor and Zetia -Plavix and ASA   CKD stage 2, GFR 60-89 ml/min: Renal function stable.  GFR> 60 today -Follow-up with BMP   Overweight (BMI 25.0-29.9): Body weight 79 kg, BMI 27.28 -Encourage losing weight -Exercise and healthy diet -   Body mass index is 27.28 kg/m.  VTE ppx: enoxaparin (LOVENOX) injection 40 mg Start: 02/06/24 2200   Diet:     Diet   Diet heart healthy/carb modified Room service appropriate? Yes; Fluid consistency: Thin  Consultants: None   Subjective 02/08/24    Pt reports feeling well. Denies SOB at rest. No nausea, vomiting. Would like to go home as soon as he can   Objective   Vitals:   02/07/24 2240 02/08/24 0013 02/08/24 0329 02/08/24 0804  BP:  110/67 122/77 116/61  Pulse:  94 81 71  Resp:  18  15  Temp:  97.6 F (36.4 C) 97.9 F (36.6 C) 97.8 F (36.6 C)  TempSrc:  Oral Oral Oral  SpO2: 94% 94% 96%  100%  Weight:   79 kg   Height:        Intake/Output Summary (Last 24 hours) at 02/08/2024 0811 Last data filed at 02/08/2024 0640 Gross per 24 hour  Intake 580 ml  Output 2000 ml  Net -1420 ml   Filed Weights   02/06/24 1728 02/08/24 0329  Weight: 79 kg 79 kg    Physical Exam:  General: awake, alert, NAD HEENT: atraumatic, clear conjunctiva, anicteric sclera, MMM, hearing grossly normal Respiratory: normal respiratory effort. Shallow breaths Cardiovascular: quick capillary refill, normal S1/S2, RRR, no JVD, murmurs Gastrointestinal: soft, NT, ND Nervous: A&O x3. no gross focal neurologic deficits, normal speech Extremities: moves all equally, no edema, normal tone Skin: dry, intact, normal temperature, normal color. No rashes, lesions or ulcers on exposed skin Psychiatry: normal mood, congruent affect  Labs   I have personally reviewed the following labs and imaging studies CBC    Component Value Date/Time   WBC 14.3 (H) 02/07/2024 0244   RBC 3.33 (L) 02/07/2024 0244   HGB 8.8 (L) 02/07/2024 0244   HGB 10.0 (L) 02/04/2024 1546   HGB 13.9 05/17/2014 1424   HCT 29.2 (L) 02/07/2024 0244   HCT 44.6 03/31/2014 1815   PLT 247 02/07/2024 0244   PLT 241 02/04/2024 1546   PLT 261 03/31/2014 1815   MCV 87.7 02/07/2024 0244   MCV 96 03/31/2014 1815   MCH 26.4 02/07/2024 0244   MCHC 30.1 02/07/2024 0244   RDW 16.2 (H) 02/07/2024 0244   RDW 13.7 03/31/2014 1815   LYMPHSABS 1.2 02/06/2024 1730   LYMPHSABS 0.6 (L) 09/25/2013 0356   MONOABS 0.3 02/06/2024 1730   MONOABS 0.1 (L) 09/25/2013 0356   EOSABS 0.1 02/06/2024 1730   EOSABS 0.0 09/25/2013 0356   BASOSABS 0.1 02/06/2024 1730   BASOSABS 0.0 09/25/2013 0356      Latest Ref Rng & Units 02/07/2024    2:44 AM 02/06/2024    5:30 PM 10/19/2023    5:49 AM  BMP  Glucose 70 - 99 mg/dL 604  540  981   BUN 8 - 23 mg/dL 23  15  32   Creatinine 0.61 - 1.24 mg/dL 1.91  4.78  2.95   Sodium 135 - 145 mmol/L 136  136  137   Potassium  3.5 - 5.1 mmol/L 3.4  4.0  3.9   Chloride 98 - 111 mmol/L 99  100  96   CO2 22 - 32 mmol/L 24  24  32   Calcium 8.9 - 10.3 mg/dL 8.8  8.6  8.7     DG Chest Port 1 View Result Date: 02/06/2024 CLINICAL DATA:  Shortness of breath EXAM: PORTABLE CHEST 1 VIEW COMPARISON:  X-ray and CT 10/16/2023 FINDINGS: The left inferior costophrenic angle is clipped off the edge of the film. Hyperinflation with chronic lung changes. There is a spiculated nodular focus once again left upper lobe. Please correlate with prior CT scan. Otherwise no consolidation, pneumothorax,  effusion or edema. Normal cardiopericardial silhouette with calcified aorta. There is fullness of both hila which are unchanged from previous. These could be enlarged pulmonary arteries based on the prior CT scan IMPRESSION: Hyperinflation with chronic changes. Enlarged presumed pulmonary arteries with fullness of the lung hila. Stable spiculated left upper lobe lung nodule. Please correlate with prior CT. Electronically Signed   By: Karen Kays M.D.   On: 02/06/2024 18:14    Disposition Plan & Communication  Patient status: Inpatient  Admitted From: Home Planned disposition location: Home Anticipated discharge date: 3/3 pending respiratory status improvement   Family Communication: none at bedside    Author: Leeroy Bock, DO [facility] 02/08/2024, 8:11 AM   Available by Epic secure chat 7AM-7PM. If 7PM-7AM, please contact night-coverage.  [name] contact information found on [email or web address].

## 2024-02-09 DIAGNOSIS — J9621 Acute and chronic respiratory failure with hypoxia: Secondary | ICD-10-CM | POA: Diagnosis not present

## 2024-02-09 DIAGNOSIS — J441 Chronic obstructive pulmonary disease with (acute) exacerbation: Secondary | ICD-10-CM | POA: Diagnosis not present

## 2024-02-09 LAB — CULTURE, RESPIRATORY W GRAM STAIN

## 2024-02-09 MED ORDER — LEVOFLOXACIN 750 MG PO TABS: 750.0000 mg | ORAL_TABLET | Freq: Every day | ORAL | 0 refills | Status: AC

## 2024-02-09 MED ORDER — DOXYCYCLINE HYCLATE 100 MG PO TABS: 100.0000 mg | ORAL_TABLET | Freq: Two times a day (BID) | ORAL | 0 refills | Status: DC

## 2024-02-09 MED ORDER — PREDNISONE 20 MG PO TABS: 40.0000 mg | ORAL_TABLET | Freq: Every day | ORAL | 0 refills | Status: AC

## 2024-02-09 NOTE — Care Management Important Message (Signed)
 Important Message  Patient Details  Name: Christian Fields MRN: [id number] Date of Birth: 1956-01-19   Important Message Given:  Yes - Medicare IM     Cristela Blue, CMA 02/09/2024, 11:01 AM

## 2024-02-09 NOTE — Discharge Instructions (Signed)
 Continue your antibiotic and steroid until completed.  Please monitor your oxygen concentration at home and increase your oxygen as needed.  I recommend continuing your trelegy and nebulizer treatments daily and follow up with your pulmonologist within 2 weeks

## 2024-02-09 NOTE — Plan of Care (Signed)

## 2024-02-09 NOTE — Discharge Summary (Signed)
 Physician Discharge Summary  Patient: Christian Fields:[id number] DOB: 1956-10-21   Code Status: Full Code Admit date: 02/06/2024 Discharge date: 02/09/2024 Disposition: Home, No home health services recommended PCP: Cleon Dew, FNP  Recommendations for Outpatient Follow-up:  Follow up with PCP within 1-2 weeks Regarding general hospital follow up and preventative care Recommend following up pulmonologist   Discharge Diagnoses:  Principal Problem:   COPD exacerbation (HCC) Active Problems:   Acute on chronic respiratory failure with hypoxia (HCC)   Chronic heart failure with preserved ejection fraction (HFpEF) (HCC)   HLD (hyperlipidemia)   Type II diabetes mellitus with renal manifestations (HCC)   Hypertension   CKD (chronic kidney disease) stage 2, GFR 60-89 ml/min   Chronic pain syndrome   Depression with anxiety   TIA (transient ischemic attack)   Overweight (BMI 25.0-29.9)  Brief Hospital Course Summary: Christian Fields is a 68 y.o. male with medical history significant of  COPD on 2-3 L oxygen, hypertension, hyperlipidemia, diabetes mellitus, asthma, TIA, GERD, gout, depression, OSA not on CPAP, anemia, AAA, polysubstance abuse, chronic pain syndrome on Suboxone, non-small cell lung cancer (s/p of RXT), CKD-2, who presents with SOB.  Per report, patient was found to have oxygen desaturation to 75% when patient was not wearing home oxygen), initially started on 6L oxygen with short period of needing bipap.   ED Course:WBC 14.5, negative PCR for COVID, flu and RSV, lactic acid 1.3, troponin 9 x 2, GFR> 60.  Temperature 99.9, blood pressure 102/66, heart rate of 106, RR 23.    Chest x-ray: Hyperinflation with chronic changes. Enlarged presumed pulmonary arteries with fullness of the lung hila. Stable spiculated left upper lobe lung nodule.    EKG: Sinus rhythm, QTc 403, low voltage, LAD, poor R wave progression   He was treated with bronchodilators, steroids, and  antibiotics. Over the course of his hospitalization, he was gradually able to wean to his baseline oxygen at rest.  Needed increase up to 6L while ambulating but he admitted that this is normal for him at home. He denies needing any refills.   Prednisone and levofloxacin prescribed to complete course after dc.  Respiratory culture returned positive for moderate pseudomonas and group A strep.   All other chronic conditions were treated with home medications.    Discharge Condition: Good, improved Recommended discharge diet: Regular healthy diet  Consultations: None   Procedures/Studies: None   Allergies as of 02/09/2024       Reactions   Bee Venom Anaphylaxis   Linezolid Other (See Comments)   Thrombocytopenia, hyperlactatemia   Azithromycin Itching, Rash        Medication List     STOP taking these medications    Ensure Max Protein Liqd   escitalopram 10 MG tablet Commonly known as: LEXAPRO   thiamine 100 MG tablet Commonly known as: VITAMIN B1       TAKE these medications    acetaminophen 325 MG tablet Commonly known as: TYLENOL Take 650 mg by mouth every 6 (six) hours as needed.   albuterol 108 (90 Base) MCG/ACT inhaler Commonly known as: VENTOLIN HFA Inhale 2 puffs into the lungs every 4 (four) hours as needed for wheezing or shortness of breath.   allopurinol 300 MG tablet Commonly known as: ZYLOPRIM Take 600 mg by mouth daily.   aspirin EC 81 MG tablet Take 1 tablet by mouth daily.   azelastine 0.1 % nasal spray Commonly known as: ASTELIN Place 2 sprays into both nostrils  2 (two) times daily as needed.   bisacodyl 5 MG EC tablet Generic drug: bisacodyl Take 1 tablet (5 mg total) by mouth daily as needed for moderate constipation.   Buprenorphine HCl-Naloxone HCl 8-2 MG Film Place 1 Film under the tongue in the morning, at noon, and at bedtime.   clopidogrel 75 MG tablet Commonly known as: PLAVIX Take 1 tablet (75 mg total) by mouth daily.    cyanocobalamin 1000 MCG/ML injection Commonly known as: VITAMIN B12 Inject 1 mL (1,000 mcg total) into the muscle every 30 (thirty) days.   Dextromethorphan-guaiFENesin 10-100 MG/5ML liquid Take 5 mLs by mouth every 4 (four) hours as needed (cough).   doxycycline 100 MG tablet Commonly known as: VIBRA-TABS Take 1 tablet (100 mg total) by mouth every 12 (twelve) hours for 3 days.   DULoxetine 30 MG capsule Commonly known as: CYMBALTA Take 30 mg by mouth daily. What changed: Another medication with the same name was removed. Continue taking this medication, and follow the directions you see here.   ezetimibe 10 MG tablet Commonly known as: ZETIA Take 10 mg by mouth daily.   fluticasone 50 MCG/ACT nasal spray Commonly known as: FLONASE Place 1 spray into both nostrils daily as needed for allergies.   gabapentin 800 MG tablet Commonly known as: NEURONTIN Take 800 mg by mouth 4 (four) times daily.   ipratropium-albuterol 0.5-2.5 (3) MG/3ML Soln Commonly known as: DUONEB Take 3 mLs by nebulization every 6 (six) hours as needed (wheezing).   levalbuterol 0.63 MG/3ML nebulizer solution Commonly known as: XOPENEX Take 3 mLs (0.63 mg total) by nebulization every 6 (six) hours as needed for wheezing or shortness of breath.   metFORMIN 1000 MG tablet Commonly known as: GLUCOPHAGE Take 1,000 mg by mouth daily.   metoprolol succinate 50 MG 24 hr tablet Commonly known as: TOPROL-XL Take 50 mg by mouth daily.   MULTIVITAMIN ADULT (MINERALS) PO Take 1 tablet by mouth every morning.   naloxone 4 MG/0.1ML Liqd nasal spray kit Commonly known as: NARCAN Place 1 spray into the nose once.   ondansetron 4 MG disintegrating tablet Commonly known as: ZOFRAN-ODT Take 4 mg by mouth every 8 (eight) hours as needed for nausea.   OXYGEN Inhale 3 L into the lungs.   pantoprazole 40 MG tablet Commonly known as: PROTONIX Take 40 mg by mouth daily as needed.   predniSONE 20 MG  tablet Commonly known as: DELTASONE Take 2 tablets (40 mg total) by mouth daily with breakfast for 3 days.   PRO-STAT SUGAR FREE PO Take 30 mLs by mouth 2 (two) times daily.   roflumilast 500 MCG Tabs tablet Commonly known as: DALIRESP Take 500 mcg by mouth daily.   simvastatin 40 MG tablet Commonly known as: ZOCOR Take 40 mg by mouth at bedtime.   Stomach Relief 525 MG/30ML suspension Generic drug: bismuth subsalicylate Take 15 mLs by mouth every 6 (six) hours as needed.   SYRINGE 3CC/25GX1" 25G X 1" 3 ML Misc 1 Syringe by Does not apply route every 30 (thirty) days.   traZODone 50 MG tablet Commonly known as: DESYREL Take 50 mg by mouth at bedtime as needed for sleep.   Trelegy Ellipta 100-62.5-25 MCG/ACT Aepb Generic drug: Fluticasone-Umeclidin-Vilant Inhale 1 puff into the lungs in the morning.        Follow-up Information     Cleon Dew, FNP. Schedule an appointment as soon as possible for a visit in 1 week(s).   Specialty: Family Medicine Contact information: [address] [place] [address] [phone]                 Subjective   Pt reports feeling well. Denies SOB or chest pain. He states that he increases his oxygen up to 6 when he is up in the kitchen or showering or anything with exertion. He has a home oxygen monitor. Needs no refills.   All questions and concerns were addressed at time of discharge.  Objective  Blood pressure 117/68, pulse 78, temperature 98.1 F (36.7 C), temperature source Oral, resp. rate 17, height 5\' 7"  (1.702 m), weight 79.2 kg, SpO2 97%.   General: Pt is alert, awake, not in acute distress Cardiovascular: RRR, S1/S2 +, no rubs, no gallops Respiratory: CTA bilaterally, no wheezing, no rhonchi Abdominal: Soft, NT, ND, bowel sounds + Extremities: no edema, no cyanosis  The results of significant diagnostics from this hospitalization (including imaging, microbiology, ancillary and laboratory) are listed  below for reference.   Imaging studies: DG Chest Port 1 View Result Date: 02/06/2024 CLINICAL DATA:  Shortness of breath EXAM: PORTABLE CHEST 1 VIEW COMPARISON:  X-ray and CT 10/16/2023 FINDINGS: The left inferior costophrenic angle is clipped off the edge of the film. Hyperinflation with chronic lung changes. There is a spiculated nodular focus once again left upper lobe. Please correlate with prior CT scan. Otherwise no consolidation, pneumothorax, effusion or edema. Normal cardiopericardial silhouette with calcified aorta. There is fullness of both hila which are unchanged from previous. These could be enlarged pulmonary arteries based on the prior CT scan IMPRESSION: Hyperinflation with chronic changes. Enlarged presumed pulmonary arteries with fullness of the lung hila. Stable spiculated left upper lobe lung nodule. Please correlate with prior CT. Electronically Signed   By: Karen Kays M.D.   On: 02/06/2024 18:14   VAS [place] ABI WITH/WO TBI Result Date: 02/06/2024  LOWER EXTREMITY DOPPLER STUDY Patient Name:  BARTON WANT  Date of Exam:   02/05/2024 Medical Rec #: [id number]       Accession #:    [id number] Date of Birth: 1956/02/02       Patient Gender: M Patient Age:   33 years Exam Location:  [facility] Procedure:      VAS [place] ABI WITH/WO TBI Referring Phys: --------------------------------------------------------------------------------  Indications: Peripheral artery disease, and Pain swerlling.  Vascular Interventions: 07/03/2023 Percutaneous transluminal angioplasty of left                         SFA with 6 mm diameter by 15 cm length Lutonix                         drug-coated angioplasty balloon                         5. Stent placement to the left SFA with 7 mm diameter by                         10 cm length life stent. Comparison Study: 11/2020 Performing Technologist: Salvadore Farber RVT  Examination Guidelines: A complete evaluation includes at minimum, Doppler waveform signals and  systolic blood pressure reading at the level of bilateral brachial, anterior tibial, and posterior tibial arteries, when vessel segments are accessible. Bilateral testing is considered an integral part of a complete examination. Photoelectric Plethysmograph (PPG) waveforms and toe systolic pressure readings are  included as required and additional duplex testing as needed. Limited examinations for reoccurring indications may be performed as noted.  ABI Findings: +---------+------------------+-----+---------+--------+ Right    Rt Pressure (mmHg)IndexWaveform Comment  +---------+------------------+-----+---------+--------+ Brachial 129                                      +---------+------------------+-----+---------+--------+ PTA      143               1.10 biphasic          +---------+------------------+-----+---------+--------+ DP       152               1.17 triphasic         +---------+------------------+-----+---------+--------+ Great Toe92                0.71 Abnormal          +---------+------------------+-----+---------+--------+ +---------+------------------+-----+---------+-------+ Left     Lt Pressure (mmHg)IndexWaveform Comment +---------+------------------+-----+---------+-------+ Brachial 130                                     +---------+------------------+-----+---------+-------+ PTA      141               1.08 triphasic        +---------+------------------+-----+---------+-------+ DP       140               1.08 triphasic        +---------+------------------+-----+---------+-------+ Great Toe103               0.79 Abnormal         +---------+------------------+-----+---------+-------+ +-------+-----------+-----------+------------+------------+ ABI/TBIToday's ABIToday's TBIPrevious ABIPrevious TBI +-------+-----------+-----------+------------+------------+ Right  1.17       .71        1.13        1.21          +-------+-----------+-----------+------------+------------+ Left   1.08       .79        1.01        .92          +-------+-----------+-----------+------------+------------+ Bilateral ABIs appear essentially unchanged compared to prior study on 2022.  Summary: Right: Resting right ankle-brachial index is within normal range. The right toe-brachial index is normal. Left: Resting left ankle-brachial index is within normal range. The left toe-brachial index is normal. *See table(s) above for measurements and observations.  Electronically signed by Festus Barren MD on 02/06/2024 at 11:53:43 AM.    Final    VAS [place] AAA DUPLEX Result Date: 02/06/2024 ABDOMINAL AORTA STUDY Patient Name:  OMARION MINNEHAN  Date of Exam:   02/05/2024 Medical Rec #: [id number]       Accession #:    [id number] Date of Birth: 1956-05-31       Patient Gender: M Patient Age:   66 years Exam Location:  [facility] Procedure:      VAS [place] AAA DUPLEX Referring Phys: Festus Barren --------------------------------------------------------------------------------  Comparison Study: CT from 2022 = 3.8cm distal AAA Performing Technologist: Salvadore Farber RVT  Examination Guidelines: A complete evaluation includes B-mode imaging, spectral Doppler, color Doppler, and power Doppler as needed of all accessible portions of each vessel. Bilateral testing is considered an integral part of a complete examination. Limited examinations for reoccurring indications may be performed as noted.  Abdominal  Aorta Findings: +-----------+-------+----------+----------+--------+--------+--------+ Location   AP (cm)Trans (cm)PSV (cm/s)WaveformThrombusComments +-----------+-------+----------+----------+--------+--------+--------+ Proximal   2.58   2.47      63                                 +-----------+-------+----------+----------+--------+--------+--------+ Mid        2.11   1.87      51                                  +-----------+-------+----------+----------+--------+--------+--------+ Distal     3.90   3.73      40                                 +-----------+-------+----------+----------+--------+--------+--------+ RT CIA Prox1.2    1.2       163                                +-----------+-------+----------+----------+--------+--------+--------+ LT CIA Prox1.3    1.2       106                                +-----------+-------+----------+----------+--------+--------+--------+  Summary: Abdominal Aorta: There 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 The largest aortic measurement is 3.9 cm. The largest aortic diameter has increased compared to prior exam. Previous diameter measurement was 3.8 cm obtained on 02/2021 CT.  *See table(s) above for measurements and observations.  Electronically signed by Festus Barren MD on 02/06/2024 at 11:53:34 AM.    Final     Labs: Basic Metabolic Panel: Recent Labs  Lab 02/06/24 1730 02/07/24 0244  NA 136 136  K 4.0 3.4*  CL 100 99  CO2 24 24  GLUCOSE 115* 383*  BUN 15 23  CREATININE 1.14 1.51*  CALCIUM 8.6* 8.8*  MG  --  1.6*   CBC: Recent Labs  Lab 02/04/24 1546 02/06/24 1730 02/07/24 0244  WBC 10.7* 14.5* 14.3*  NEUTROABS  --  12.8*  --   HGB 10.0* 9.3* 8.8*  HCT 33.3* 30.7* 29.2*  MCV 88.1 85.8 87.7  PLT 241 249 247   Microbiology: Results for orders placed or performed during the hospital encounter of 02/06/24  Resp panel by RT-PCR (RSV, Flu A&B, Covid) Anterior Nasal Swab     Status: None   Collection Time: 02/06/24  5:31 PM   Specimen: Anterior Nasal Swab  Result Value Ref Range Status   SARS Coronavirus 2 by RT PCR NEGATIVE NEGATIVE Final    Comment: (NOTE) SARS-CoV-2 target nucleic acids are NOT DETECTED.  The SARS-CoV-2 RNA is generally detectable in upper respiratory specimens during the acute phase of infection. The lowest concentration of SARS-CoV-2 viral copies this assay can detect is 138  copies/mL. A negative result does not preclude SARS-Cov-2 infection and should not be used as the sole basis for treatment or other patient management decisions. A negative result may occur with  improper specimen collection/handling, submission of specimen other than nasopharyngeal swab, presence of viral mutation(s) within the areas targeted by this assay, and inadequate number of viral copies(<138 copies/mL). A negative result must be combined with clinical observations, patient history, and epidemiological information. The expected result  is Negative.  Fact Sheet for Patients:  BloggerCourse.com  Fact Sheet for Healthcare Providers:  SeriousBroker.it  This test is no t yet approved or cleared by the [place] FDA and  has been authorized for detection and/or diagnosis of SARS-CoV-2 by FDA under an Emergency Use Authorization (EUA). This EUA will remain  in effect (meaning this test can be used) for the duration of the COVID-19 declaration under Section 564(b)(1) of the Act, 21 U.S.C.section 360bbb-3(b)(1), unless the authorization is terminated  or revoked sooner.       Influenza A by PCR NEGATIVE NEGATIVE Final   Influenza B by PCR NEGATIVE NEGATIVE Final    Comment: (NOTE) The Xpert Xpress SARS-CoV-2/FLU/RSV plus assay is intended as an aid in the diagnosis of influenza from Nasopharyngeal swab specimens and should not be used as a sole basis for treatment. Nasal washings and aspirates are unacceptable for Xpert Xpress SARS-CoV-2/FLU/RSV testing.  Fact Sheet for Patients: BloggerCourse.com  Fact Sheet for Healthcare Providers: SeriousBroker.it  This test is not yet approved or cleared by the [place] FDA and has been authorized for detection and/or diagnosis of SARS-CoV-2 by FDA under an Emergency Use Authorization (EUA). This EUA will remain in effect (meaning  this test can be used) for the duration of the COVID-19 declaration under Section 564(b)(1) of the Act, 21 U.S.C. section 360bbb-3(b)(1), unless the authorization is terminated or revoked.     Resp Syncytial Virus by PCR NEGATIVE NEGATIVE Final    Comment: (NOTE) Fact Sheet for Patients: BloggerCourse.com  Fact Sheet for Healthcare Providers: SeriousBroker.it  This test is not yet approved or cleared by the [place] FDA and has been authorized for detection and/or diagnosis of SARS-CoV-2 by FDA under an Emergency Use Authorization (EUA). This EUA will remain in effect (meaning this test can be used) for the duration of the COVID-19 declaration under Section 564(b)(1) of the Act, 21 U.S.C. section 360bbb-3(b)(1), unless the authorization is terminated or revoked.  Performed at [facility], [address] [place], [place] [address]   Respiratory (~20 pathogens) panel by PCR     Status: None   Collection Time: 02/06/24  5:31 PM   Specimen: Nasopharyngeal Swab; Respiratory  Result Value Ref Range Status   Adenovirus NOT DETECTED NOT DETECTED Final   Coronavirus 229E NOT DETECTED NOT DETECTED Final    Comment: (NOTE) The Coronavirus on the Respiratory Panel, DOES NOT test for the novel  Coronavirus (2019 nCoV)    Coronavirus HKU1 NOT DETECTED NOT DETECTED Final   Coronavirus NL63 NOT DETECTED NOT DETECTED Final   Coronavirus OC43 NOT DETECTED NOT DETECTED Final   Metapneumovirus NOT DETECTED NOT DETECTED Final   Rhinovirus / Enterovirus NOT DETECTED NOT DETECTED Final   Influenza A NOT DETECTED NOT DETECTED Final   Influenza B NOT DETECTED NOT DETECTED Final   Parainfluenza Virus 1 NOT DETECTED NOT DETECTED Final   Parainfluenza Virus 2 NOT DETECTED NOT DETECTED Final   Parainfluenza Virus 3 NOT DETECTED NOT DETECTED Final   Parainfluenza Virus 4 NOT DETECTED NOT DETECTED Final   Respiratory Syncytial Virus NOT  DETECTED NOT DETECTED Final   Bordetella pertussis NOT DETECTED NOT DETECTED Final   Bordetella Parapertussis NOT DETECTED NOT DETECTED Final   Chlamydophila pneumoniae NOT DETECTED NOT DETECTED Final   Mycoplasma pneumoniae NOT DETECTED NOT DETECTED Final    Comment: Performed at [facility], [address] [place], [place] [address]  Expectorated Sputum Assessment w Gram Stain, Rflx to Resp  Cult     Status: None   Collection Time: 02/07/24 12:36 AM   Specimen: Sputum  Result Value Ref Range Status   Specimen Description SPUTUM  Final   Special Requests NONE  Final   Sputum evaluation   Final    THIS SPECIMEN IS ACCEPTABLE FOR SPUTUM CULTURE Performed at [facility], [address], [place], [place] [address]    Report Status 02/07/2024 FINAL  Final  Culture, Respiratory w Gram Stain     Status: None (Preliminary result)   Collection Time: 02/07/24 12:36 AM   Specimen: SPU  Result Value Ref Range Status   Specimen Description   Final    SPUTUM Performed at [facility], [address] [place], [place] [address]    Special Requests   Final    NONE Reflexed from [id number] Performed at [facility], [address] [place], [place] [address]    Gram Stain   Final    FEW WBC PRESENT, PREDOMINANTLY MONONUCLEAR FEW GRAM POSITIVE COCCI IN CLUSTERS FEW GRAM NEGATIVE RODS    Culture   Final    MODERATE PSEUDOMONAS AERUGINOSA FEW STREPTOCOCCUS PYOGENES Beta hemolytic streptococci are predictably susceptible to penicillin and other beta lactams. Susceptibility testing not routinely performed. SUSCEPTIBILITIES TO FOLLOW FOR PSEUDOMONAS AERUGINOSA Performed at [facility], [address], [place], [place] [address]    Report Status PENDING  Incomplete    Time coordinating discharge: Over 30 minutes  Leeroy Bock, MD  [facility] 02/09/2024, 10:27 AM

## 2024-02-09 NOTE — Progress Notes (Signed)
 SATURATION QUALIFICATIONS: (This note is used to comply with regulatory documentation for home oxygen)  Patient Saturations on 3L of oxygen at Rest = 93%  Patient Saturations on 3L of oxygen while Ambulating = 81%  Patient Saturations on 6 Liters of oxygen while Ambulating = 91%  Patient is on 3L of oxygen at home but requires more oxygen with exertion.

## 2024-02-09 NOTE — TOC Transition Note (Signed)
 Transition of Care [facility]) - Discharge Note   Patient Details  Name: Christian Fields MRN: [id number] Date of Birth: [birth year]  Transition of Care [facility] CM/SW Contact:  Truddie Hidden, RN Phone Number: 02/09/2024, 11:43 AM   Clinical Narrative:    RA completed   Admitted for: COPD excerbation Admitted from: Home alone Pharmacy: [facility]  Current home health/prior home health/DME:Oxygen via [employer], walker "Various kinds," Canes, "Several types," shower chair, knee scooter Transportation: daughter for discharge and appointments HH: "Not interested at all."    Final next level of care: Home/Self Care Barriers to Discharge: Barriers Resolved   Patient Goals and CMS Choice Patient states their goals for this hospitalization and ongoing recovery are:: Return home          Discharge Placement                       Discharge Plan and Services Additional resources added to the After Visit Summary for                                       Social Drivers of Health (SDOH) Interventions SDOH Screenings   Food Insecurity: No Food Insecurity (02/08/2024)  Housing: Low Risk  (02/08/2024)  Transportation Needs: No Transportation Needs (02/08/2024)  Utilities: Not At Risk (02/08/2024)  Financial Resource Strain: Low Risk  (02/05/2024)   [facility] from [facility]  Social Connections: Socially Isolated (02/08/2024)  Tobacco Use: Medium Risk (02/06/2024)     Readmission Risk Interventions    02/09/2024   11:40 AM 10/09/2023   11:34 AM 10/07/2023    1:48 PM  Readmission Risk Prevention Plan  Transportation Screening Complete  Complete  Medication Review (RN Care Manager) Complete  Complete  PCP or Specialist appointment within 3-5 days of discharge Complete    HRI or Home Care Consult Complete  Complete  SW Recovery Care/Counseling Consult Complete Complete   Palliative Care Screening Not Applicable  Not Applicable  Skilled Nursing Facility Complete   Complete

## 2024-02-09 NOTE — Progress Notes (Incomplete)
 PROGRESS NOTE Christian Fields    DOB: Nov 06, 1956, 68 y.o.  BJY:[id number]    Code Status: Full Code   DOA: 02/06/2024   LOS: 3  Brief hospital course  Christian Fields is a 68 y.o. male with medical history significant of  COPD on 2-3 L oxygen, hypertension, hyperlipidemia, diabetes mellitus, asthma, TIA, GERD, gout, depression, OSA not on CPAP, anemia, AAA, polysubstance abuse, chronic pain syndrome on Suboxone, non-small cell lung cancer (s/p of RXT), CKD-2, who presents with SOB.  Per report, patient was found to have oxygen desaturation to 75% when patient was not wearing home oxygen), initially started on 6L oxygen with short period of needing bipap.   ED Course:WBC 14.5, negative PCR for COVID, flu and RSV, lactic acid 1.3, troponin 9 x 2, GFR> 60.  Temperature 99.9, blood pressure 102/66, heart rate of 106, RR 23.    Chest x-ray: Hyperinflation with chronic changes. Enlarged presumed pulmonary arteries with fullness of the lung hila. Stable spiculated left upper lobe lung nodule.    EKG: Sinus rhythm, QTc 403, low voltage, LAD, poor R wave progression  02/09/24 -stable. Wants to go home. Unfortunately, still desats to less than 90% while ambulating on increased O2 from baseline.   Assessment & Plan  Principal Problem:   COPD exacerbation (HCC) Active Problems:   Acute on chronic respiratory failure with hypoxia (HCC)   Chronic heart failure with preserved ejection fraction (HFpEF) (HCC)   HLD (hyperlipidemia)   Type II diabetes mellitus with renal manifestations (HCC)   Hypertension   CKD (chronic kidney disease) stage 2, GFR 60-89 ml/min   Chronic pain syndrome   Depression with anxiety   TIA (transient ischemic attack)   Overweight (BMI 25.0-29.9)  Acute on chronic respiratory failure with hypoxia due to COPD exacerbation [facility]): Patient has increased oxygen requirement from 2-3 liter. Now on 5-6L. Negative RVP. Expectorant culture still pending Required up to 6L while  ambulating today. Will plan another day inpatient and possible dc tomorrow - continue steroids, Abx, breathing treatments for COPD exacerbation - wean O2 to baseline of 3 as tolerated and then ambulate with pulse ox to verify he will be able to go home safely on this regimen.  H/o NSCLC- follows with Dr. Smith Robert - continue with screening CT chest end of this month. Complete sooner if unable to wean   Chronic diastolic CHF: echo 01/11/21 showed EF of 55-60% with grade 2 diastolic dysfunction.  Patient has elevated BNP to 15, but no leg edema, does not seem to have CHF exacerbation. -Watch volume status closely   Type 2 diabetes mellitus with foot ulcer (HCC): Recent A1c 4.9, well-controlled.  Patient is taking metformin -SSI cancel due to well controlled BS even on steroids  AAA- vascular US shows that AAA diameter has decreased in interval imaging.    Hypertension -Metoprolol   Chronic pain syndrome -Continue home Suboxone and Neurontin   Depression with anxiety -Continue home medications   TIA  HLD- ABI was normal this admission -Zocor and Zetia -Plavix and ASA   CKD stage 2, GFR 60-89 ml/min: Renal function stable.  GFR> 60 today -Follow-up with BMP   Overweight (BMI 25.0-29.9): Body weight 79 kg, BMI 27.28 -Encourage losing weight -Exercise and healthy diet -   Body mass index is 27.35 kg/m.  VTE ppx: enoxaparin (LOVENOX) injection 40 mg Start: 02/06/24 2200   Diet:     Diet   Diet heart healthy/carb modified Room service appropriate? Yes; Fluid consistency: Thin  Consultants: None   Subjective 02/09/24    Pt reports feeling well. Denies SOB at rest. No nausea, vomiting. Would like to go home as soon as he can   Objective   Vitals:   02/08/24 2050 02/09/24 0052 02/09/24 0401 02/09/24 0500  BP: 130/60 116/63 106/68   Pulse: 90 97 89   Resp: (!) 21 18 17    Temp: 97.8 F (36.6 C) 98 F (36.7 C) 98.5 F (36.9 C)   TempSrc: Oral Oral Oral   SpO2: 93% 92%  94%   Weight:    79.2 kg  Height:        Intake/Output Summary (Last 24 hours) at 02/09/2024 0736 Last data filed at 02/09/2024 0612 Gross per 24 hour  Intake 240 ml  Output 1650 ml  Net -1410 ml   Filed Weights   02/06/24 1728 02/08/24 0329 02/09/24 0500  Weight: 79 kg 79 kg 79.2 kg    Physical Exam:  General: awake, alert, NAD HEENT: atraumatic, clear conjunctiva, anicteric sclera, MMM, hearing grossly normal Respiratory: normal respiratory effort. Shallow breaths Cardiovascular: quick capillary refill, normal S1/S2, RRR, no JVD, murmurs Gastrointestinal: soft, NT, ND Nervous: A&O x3. no gross focal neurologic deficits, normal speech Extremities: moves all equally, no edema, normal tone Skin: dry, intact, normal temperature, normal color. No rashes, lesions or ulcers on exposed skin Psychiatry: normal mood, congruent affect  Labs   I have personally reviewed the following labs and imaging studies CBC    Component Value Date/Time   WBC 14.3 (H) 02/07/2024 0244   RBC 3.33 (L) 02/07/2024 0244   HGB 8.8 (L) 02/07/2024 0244   HGB 10.0 (L) 02/04/2024 1546   HGB 13.9 05/17/2014 1424   HCT 29.2 (L) 02/07/2024 0244   HCT 44.6 03/31/2014 1815   PLT 247 02/07/2024 0244   PLT 241 02/04/2024 1546   PLT 261 03/31/2014 1815   MCV 87.7 02/07/2024 0244   MCV 96 03/31/2014 1815   MCH 26.4 02/07/2024 0244   MCHC 30.1 02/07/2024 0244   RDW 16.2 (H) 02/07/2024 0244   RDW 13.7 03/31/2014 1815   LYMPHSABS 1.2 02/06/2024 1730   LYMPHSABS 0.6 (L) 09/25/2013 0356   MONOABS 0.3 02/06/2024 1730   MONOABS 0.1 (L) 09/25/2013 0356   EOSABS 0.1 02/06/2024 1730   EOSABS 0.0 09/25/2013 0356   BASOSABS 0.1 02/06/2024 1730   BASOSABS 0.0 09/25/2013 0356      Latest Ref Rng & Units 02/07/2024    2:44 AM 02/06/2024    5:30 PM 10/19/2023    5:49 AM  BMP  Glucose 70 - 99 mg/dL 161  096  045   BUN 8 - 23 mg/dL 23  15  32   Creatinine 0.61 - 1.24 mg/dL 4.09  8.11  9.14   Sodium 135 - 145 mmol/L  136  136  137   Potassium 3.5 - 5.1 mmol/L 3.4  4.0  3.9   Chloride 98 - 111 mmol/L 99  100  96   CO2 22 - 32 mmol/L 24  24  32   Calcium 8.9 - 10.3 mg/dL 8.8  8.6  8.7     No results found.   Disposition Plan & Communication  Patient status: Inpatient  Admitted From: Home Planned disposition location: Home Anticipated discharge date: 3/3 pending respiratory status improvement   Family Communication: none at bedside    Author: Leeroy Bock, DO [facility] 02/09/2024, 7:36 AM   Available by Epic secure chat 7AM-7PM. If 7PM-7AM, please  contact night-coverage.  [name] contact information found on [email or web address].

## 2024-02-12 ENCOUNTER — Inpatient Hospital Stay: Payer: 59

## 2024-02-19 ENCOUNTER — Observation Stay

## 2024-02-19 ENCOUNTER — Inpatient Hospital Stay: Payer: 59

## 2024-02-19 ENCOUNTER — Inpatient Hospital Stay
Admission: EM | Admit: 2024-02-19 | Discharge: 2024-02-23 | DRG: 291 | Disposition: A | Discharge status: Home / Self Care | Attending: Internal Medicine | Admitting: Internal Medicine

## 2024-02-19 ENCOUNTER — Other Ambulatory Visit: Payer: Self-pay

## 2024-02-19 ENCOUNTER — Emergency Department

## 2024-02-19 DIAGNOSIS — J440 Chronic obstructive pulmonary disease with acute lower respiratory infection: Secondary | ICD-10-CM | POA: Diagnosis present

## 2024-02-19 DIAGNOSIS — J189 Pneumonia, unspecified organism: Secondary | ICD-10-CM | POA: Diagnosis present

## 2024-02-19 DIAGNOSIS — I5033 Acute on chronic diastolic (congestive) heart failure: Secondary | ICD-10-CM

## 2024-02-19 DIAGNOSIS — Z86718 Personal history of other venous thrombosis and embolism: Secondary | ICD-10-CM

## 2024-02-19 DIAGNOSIS — N179 Acute kidney failure, unspecified: Secondary | ICD-10-CM | POA: Diagnosis present

## 2024-02-19 DIAGNOSIS — E785 Hyperlipidemia, unspecified: Secondary | ICD-10-CM | POA: Diagnosis present

## 2024-02-19 DIAGNOSIS — I10 Essential (primary) hypertension: Secondary | ICD-10-CM | POA: Diagnosis present

## 2024-02-19 DIAGNOSIS — E1129 Type 2 diabetes mellitus with other diabetic kidney complication: Secondary | ICD-10-CM | POA: Diagnosis present

## 2024-02-19 DIAGNOSIS — D631 Anemia in chronic kidney disease: Secondary | ICD-10-CM | POA: Diagnosis present

## 2024-02-19 DIAGNOSIS — G4733 Obstructive sleep apnea (adult) (pediatric): Secondary | ICD-10-CM | POA: Diagnosis present

## 2024-02-19 DIAGNOSIS — Z7902 Long term (current) use of antithrombotics/antiplatelets: Secondary | ICD-10-CM

## 2024-02-19 DIAGNOSIS — C3492 Malignant neoplasm of unspecified part of left bronchus or lung: Secondary | ICD-10-CM | POA: Diagnosis present

## 2024-02-19 DIAGNOSIS — Z8673 Personal history of transient ischemic attack (TIA), and cerebral infarction without residual deficits: Secondary | ICD-10-CM

## 2024-02-19 DIAGNOSIS — E1122 Type 2 diabetes mellitus with diabetic chronic kidney disease: Secondary | ICD-10-CM | POA: Diagnosis present

## 2024-02-19 DIAGNOSIS — F1120 Opioid dependence, uncomplicated: Secondary | ICD-10-CM | POA: Diagnosis present

## 2024-02-19 DIAGNOSIS — J9611 Chronic respiratory failure with hypoxia: Secondary | ICD-10-CM

## 2024-02-19 DIAGNOSIS — N1831 Chronic kidney disease, stage 3a: Secondary | ICD-10-CM

## 2024-02-19 DIAGNOSIS — I714 Abdominal aortic aneurysm, without rupture, unspecified: Secondary | ICD-10-CM | POA: Diagnosis present

## 2024-02-19 DIAGNOSIS — K219 Gastro-esophageal reflux disease without esophagitis: Secondary | ICD-10-CM | POA: Diagnosis present

## 2024-02-19 DIAGNOSIS — N182 Chronic kidney disease, stage 2 (mild): Secondary | ICD-10-CM | POA: Diagnosis present

## 2024-02-19 DIAGNOSIS — I509 Heart failure, unspecified: Secondary | ICD-10-CM | POA: Diagnosis not present

## 2024-02-19 DIAGNOSIS — Z87891 Personal history of nicotine dependence: Secondary | ICD-10-CM

## 2024-02-19 DIAGNOSIS — Z91030 Bee allergy status: Secondary | ICD-10-CM

## 2024-02-19 DIAGNOSIS — F32A Depression, unspecified: Secondary | ICD-10-CM | POA: Diagnosis present

## 2024-02-19 DIAGNOSIS — D649 Anemia, unspecified: Secondary | ICD-10-CM | POA: Diagnosis present

## 2024-02-19 DIAGNOSIS — E1151 Type 2 diabetes mellitus with diabetic peripheral angiopathy without gangrene: Secondary | ICD-10-CM | POA: Diagnosis present

## 2024-02-19 DIAGNOSIS — G4730 Sleep apnea, unspecified: Secondary | ICD-10-CM | POA: Diagnosis present

## 2024-02-19 DIAGNOSIS — M109 Gout, unspecified: Secondary | ICD-10-CM | POA: Diagnosis present

## 2024-02-19 DIAGNOSIS — J151 Pneumonia due to Pseudomonas: Secondary | ICD-10-CM | POA: Diagnosis present

## 2024-02-19 DIAGNOSIS — I130 Hypertensive heart and chronic kidney disease with heart failure and stage 1 through stage 4 chronic kidney disease, or unspecified chronic kidney disease: Principal | ICD-10-CM | POA: Diagnosis present

## 2024-02-19 DIAGNOSIS — Z888 Allergy status to other drugs, medicaments and biological substances status: Secondary | ICD-10-CM

## 2024-02-19 DIAGNOSIS — F419 Anxiety disorder, unspecified: Secondary | ICD-10-CM | POA: Diagnosis present

## 2024-02-19 DIAGNOSIS — Z7984 Long term (current) use of oral hypoglycemic drugs: Secondary | ICD-10-CM

## 2024-02-19 DIAGNOSIS — I959 Hypotension, unspecified: Secondary | ICD-10-CM | POA: Diagnosis not present

## 2024-02-19 DIAGNOSIS — Z85828 Personal history of other malignant neoplasm of skin: Secondary | ICD-10-CM

## 2024-02-19 DIAGNOSIS — Z79899 Other long term (current) drug therapy: Secondary | ICD-10-CM

## 2024-02-19 DIAGNOSIS — G894 Chronic pain syndrome: Secondary | ICD-10-CM | POA: Diagnosis present

## 2024-02-19 DIAGNOSIS — J441 Chronic obstructive pulmonary disease with (acute) exacerbation: Secondary | ICD-10-CM | POA: Diagnosis present

## 2024-02-19 DIAGNOSIS — Z8249 Family history of ischemic heart disease and other diseases of the circulatory system: Secondary | ICD-10-CM

## 2024-02-19 DIAGNOSIS — J449 Chronic obstructive pulmonary disease, unspecified: Secondary | ICD-10-CM | POA: Diagnosis present

## 2024-02-19 DIAGNOSIS — Z7951 Long term (current) use of inhaled steroids: Secondary | ICD-10-CM

## 2024-02-19 DIAGNOSIS — Z7982 Long term (current) use of aspirin: Secondary | ICD-10-CM

## 2024-02-19 DIAGNOSIS — R9389 Abnormal findings on diagnostic imaging of other specified body structures: Secondary | ICD-10-CM

## 2024-02-19 LAB — CBC WITH DIFFERENTIAL/PLATELET
Abs Immature Granulocytes: 0.12 10*3/uL — ABNORMAL HIGH (ref 0.00–0.07)
Basophils Absolute: 0.1 10*3/uL (ref 0.0–0.1)
Basophils Relative: 1 %
Eosinophils Absolute: 0.6 10*3/uL — ABNORMAL HIGH (ref 0.0–0.5)
Eosinophils Relative: 4 %
HCT: 30.5 % — ABNORMAL LOW (ref 39.0–52.0)
Hemoglobin: 9.1 g/dL — ABNORMAL LOW (ref 13.0–17.0)
Immature Granulocytes: 1 %
Lymphocytes Relative: 15 %
Lymphs Abs: 2 10*3/uL (ref 0.7–4.0)
MCH: 26 pg (ref 26.0–34.0)
MCHC: 29.8 g/dL — ABNORMAL LOW (ref 30.0–36.0)
MCV: 87.1 fL (ref 80.0–100.0)
Monocytes Absolute: 1.1 10*3/uL — ABNORMAL HIGH (ref 0.1–1.0)
Monocytes Relative: 8 %
Neutro Abs: 9.7 10*3/uL — ABNORMAL HIGH (ref 1.7–7.7)
Neutrophils Relative %: 71 %
Platelets: 219 10*3/uL (ref 150–400)
RBC: 3.5 MIL/uL — ABNORMAL LOW (ref 4.22–5.81)
RDW: 16.2 % — ABNORMAL HIGH (ref 11.5–15.5)
WBC: 13.4 10*3/uL — ABNORMAL HIGH (ref 4.0–10.5)
nRBC: 0 % (ref 0.0–0.2)

## 2024-02-19 LAB — TROPONIN I (HIGH SENSITIVITY): Troponin I (High Sensitivity): 10 ng/L (ref <18)

## 2024-02-19 LAB — BRAIN NATRIURETIC PEPTIDE: B Natriuretic Peptide: 284.6 pg/mL — ABNORMAL HIGH (ref 0.0–100.0)

## 2024-02-19 LAB — BASIC METABOLIC PANEL
Anion gap: 11 (ref 5–15)
BUN: 21 mg/dL (ref 8–23)
CO2: 30 mmol/L (ref 22–32)
Calcium: 9 mg/dL (ref 8.9–10.3)
Chloride: 96 mmol/L — ABNORMAL LOW (ref 98–111)
Creatinine, Ser: 1.65 mg/dL — ABNORMAL HIGH (ref 0.61–1.24)
GFR, Estimated: 45 mL/min — ABNORMAL LOW (ref >60)
Glucose, Bld: 160 mg/dL — ABNORMAL HIGH (ref 70–99)
Potassium: 4.4 mmol/L (ref 3.5–5.1)
Sodium: 137 mmol/L (ref 135–145)

## 2024-02-19 MED ORDER — GABAPENTIN 400 MG PO CAPS
800.0000 mg | ORAL_CAPSULE | Freq: Four times a day (QID) | ORAL | Status: DC
  Administered 2024-02-19 – 2024-02-23 (×14): 800 mg via ORAL
  Filled 2024-02-19 (×13): qty 2
  Filled 2024-02-19: qty 8
  Filled 2024-02-19 (×2): qty 2
  Filled 2024-02-19: qty 8

## 2024-02-19 MED ORDER — UMECLIDINIUM BROMIDE 62.5 MCG/ACT IN AEPB
1.0000 | INHALATION_SPRAY | Freq: Every day | RESPIRATORY_TRACT | Status: DC
  Administered 2024-02-21 – 2024-02-23 (×3): 1 via RESPIRATORY_TRACT
  Filled 2024-02-19: qty 7

## 2024-02-19 MED ORDER — GUAIFENESIN-DM 100-10 MG/5ML PO SYRP: 5.0000 mL | ORAL_SOLUTION | ORAL | Status: DC | PRN

## 2024-02-19 MED ORDER — METOPROLOL SUCCINATE ER 50 MG PO TB24
50.0000 mg | ORAL_TABLET | Freq: Every day | ORAL | Status: DC
  Administered 2024-02-20 – 2024-02-23 (×3): 50 mg via ORAL
  Filled 2024-02-19 (×4): qty 1

## 2024-02-19 MED ORDER — SIMVASTATIN 20 MG PO TABS
40.0000 mg | ORAL_TABLET | Freq: Every day | ORAL | Status: DC
  Administered 2024-02-19 – 2024-02-22 (×4): 40 mg via ORAL
  Filled 2024-02-19: qty 4
  Filled 2024-02-19 (×3): qty 2

## 2024-02-19 MED ORDER — FUROSEMIDE 10 MG/ML IJ SOLN
20.0000 mg | Freq: Two times a day (BID) | INTRAMUSCULAR | Status: DC
  Administered 2024-02-20 – 2024-02-21 (×3): 20 mg via INTRAVENOUS
  Filled 2024-02-19 (×2): qty 2
  Filled 2024-02-19: qty 4

## 2024-02-19 MED ORDER — ENOXAPARIN SODIUM 40 MG/0.4ML IJ SOSY
40.0000 mg | PREFILLED_SYRINGE | INTRAMUSCULAR | Status: DC
  Administered 2024-02-19 – 2024-02-22 (×4): 40 mg via SUBCUTANEOUS
  Filled 2024-02-19 (×4): qty 0.4

## 2024-02-19 MED ORDER — PANTOPRAZOLE SODIUM 40 MG PO TBEC
40.0000 mg | DELAYED_RELEASE_TABLET | Freq: Every day | ORAL | Status: DC
  Administered 2024-02-19 – 2024-02-23 (×5): 40 mg via ORAL
  Filled 2024-02-19 (×5): qty 1

## 2024-02-19 MED ORDER — FUROSEMIDE 10 MG/ML IJ SOLN
40.0000 mg | Freq: Once | INTRAMUSCULAR | Status: AC
  Administered 2024-02-19: 40 mg via INTRAVENOUS
  Filled 2024-02-19: qty 4

## 2024-02-19 MED ORDER — ONDANSETRON HCL 4 MG PO TABS: 4.0000 mg | ORAL_TABLET | Freq: Four times a day (QID) | ORAL | Status: DC | PRN

## 2024-02-19 MED ORDER — LEVALBUTEROL HCL 0.63 MG/3ML IN NEBU
0.6300 mg | INHALATION_SOLUTION | Freq: Four times a day (QID) | RESPIRATORY_TRACT | Status: DC | PRN
Start: 2024-02-19 — End: 2024-02-23

## 2024-02-19 MED ORDER — BISMUTH SUBSALICYLATE 262 MG/15ML PO SUSP: 15.0000 mL | Freq: Four times a day (QID) | ORAL | Status: DC | PRN

## 2024-02-19 MED ORDER — ASPIRIN 81 MG PO TBEC
81.0000 mg | DELAYED_RELEASE_TABLET | Freq: Every day | ORAL | Status: DC
  Administered 2024-02-20 – 2024-02-23 (×4): 81 mg via ORAL
  Filled 2024-02-19 (×4): qty 1

## 2024-02-19 MED ORDER — IOHEXOL 350 MG/ML SOLN
75.0000 mL | Freq: Once | INTRAVENOUS | Status: AC | PRN
  Administered 2024-02-19: 75 mL via INTRAVENOUS

## 2024-02-19 MED ORDER — BISACODYL 5 MG PO TBEC: 5.0000 mg | DELAYED_RELEASE_TABLET | Freq: Every day | ORAL | Status: DC | PRN

## 2024-02-19 MED ORDER — DULOXETINE HCL 30 MG PO CPEP
30.0000 mg | ORAL_CAPSULE | Freq: Every day | ORAL | Status: DC
  Administered 2024-02-20 – 2024-02-23 (×4): 30 mg via ORAL
  Filled 2024-02-19 (×4): qty 1

## 2024-02-19 MED ORDER — BUPRENORPHINE HCL-NALOXONE HCL 8-2 MG SL SUBL
1.0000 | SUBLINGUAL_TABLET | Freq: Every day | SUBLINGUAL | Status: DC
  Administered 2024-02-20: 1 via SUBLINGUAL
  Filled 2024-02-19: qty 1

## 2024-02-19 MED ORDER — EZETIMIBE 10 MG PO TABS
10.0000 mg | ORAL_TABLET | Freq: Every day | ORAL | Status: DC
  Administered 2024-02-20 – 2024-02-23 (×4): 10 mg via ORAL
  Filled 2024-02-19 (×4): qty 1

## 2024-02-19 MED ORDER — FLUTICASONE FUROATE-VILANTEROL 100-25 MCG/ACT IN AEPB
1.0000 | INHALATION_SPRAY | Freq: Every day | RESPIRATORY_TRACT | Status: DC
  Administered 2024-02-21 – 2024-02-23 (×3): 1 via RESPIRATORY_TRACT
  Filled 2024-02-19: qty 28

## 2024-02-19 MED ORDER — ACETAMINOPHEN 325 MG PO TABS
650.0000 mg | ORAL_TABLET | Freq: Four times a day (QID) | ORAL | Status: DC | PRN
  Administered 2024-02-21 – 2024-02-22 (×2): 650 mg via ORAL
  Filled 2024-02-19 (×3): qty 2

## 2024-02-19 MED ORDER — ACETAMINOPHEN 650 MG RE SUPP: 650.0000 mg | Freq: Four times a day (QID) | RECTAL | Status: DC | PRN

## 2024-02-19 MED ORDER — TRAZODONE HCL 50 MG PO TABS: 50.0000 mg | ORAL_TABLET | Freq: Every evening | ORAL | Status: DC | PRN

## 2024-02-19 MED ORDER — ALLOPURINOL 100 MG PO TABS
600.0000 mg | ORAL_TABLET | Freq: Every day | ORAL | Status: DC
  Administered 2024-02-20 – 2024-02-23 (×4): 600 mg via ORAL
  Filled 2024-02-19 (×2): qty 6
  Filled 2024-02-19: qty 2
  Filled 2024-02-19: qty 6

## 2024-02-19 MED ORDER — CLOPIDOGREL BISULFATE 75 MG PO TABS
75.0000 mg | ORAL_TABLET | Freq: Every day | ORAL | Status: DC
  Administered 2024-02-20 – 2024-02-23 (×4): 75 mg via ORAL
  Filled 2024-02-19 (×4): qty 1

## 2024-02-19 MED ORDER — POTASSIUM CHLORIDE CRYS ER 20 MEQ PO TBCR
40.0000 meq | EXTENDED_RELEASE_TABLET | Freq: Every day | ORAL | Status: DC
  Administered 2024-02-19 – 2024-02-23 (×5): 40 meq via ORAL
  Filled 2024-02-19 (×5): qty 2

## 2024-02-19 MED ORDER — ROFLUMILAST 500 MCG PO TABS
500.0000 ug | ORAL_TABLET | Freq: Every day | ORAL | Status: DC
  Administered 2024-02-20 – 2024-02-23 (×4): 500 ug via ORAL
  Filled 2024-02-19 (×4): qty 1

## 2024-02-19 MED ORDER — ONDANSETRON HCL 4 MG/2ML IJ SOLN: 4.0000 mg | Freq: Four times a day (QID) | INTRAMUSCULAR | Status: DC | PRN

## 2024-02-19 NOTE — H&P (Signed)
 History and Physical    Patient: Christian Fields JYN:[id number] DOB: 1956-03-15 DOA: 02/19/2024 DOS: the patient was seen and examined on 02/19/2024 PCP: Cleon Dew, FNP  Patient coming from: Home  Chief Complaint:  Chief Complaint  Patient presents with   Shortness of Breath    HPI: Christian Fields is a 68 y.o. male with medical history significant for COPD on 2-3 L oxygen, HTN, DM, OSA not on CPAP,  polysubstance abuse, chronic pain syndrome on Suboxone, non-small cell lung cancer (s/p of RXT), recently hospitalized from 2/28 to 3/3 with COPD exacerbation requiring BiPAP discharged doxycycline for positive respiratory culture (Pseudomonas and group A strep) who returns the ED, with shortness of breath associated with bilateral lower extremity swelling.  Last echo was 02/2021 showing G2 DD, EF 55 to 60% when he was hospitalized with septic shock due to aspiration pneumonia. Patient denies chest pain leg pain.  Has no fever or chills. ED Course and data review: Tachypneic to 22 with O2 sat 95% on 3 L.  Afebrile, BP 104/69 and pulse 92. Labs notable for troponin 10 and BNP 284 WBC 13,000 with chronic leukocytosis, hemoglobin 9.1 at baseline  creatinine 1.65, up from 1.14 just 2 weeks prior EKG, personally viewed and interpreted showing sinus at 94 with first-degree AV block no acute ischemic changes. Chest x-ray pending--> IMPRESSION: 1. Emphysema and bronchitic changes. Stable irregular nodule in the peripheral left upper lobe, corresponding to history of fibrosis and treated lung lesion. 2. Question pleural line/pneumothorax at the right apex but lung markings appear to extend beyond the questioned possible pleural line. Recommend chest CT for further assessment" .  Patient treated with IV Lasix Hospitalist consulted for admission for new onset CHF   Review of Systems: As mentioned in the history of present illness. All other systems reviewed and are negative.  Past Medical  History:  Diagnosis Date   Anemia    Anxiety    Arthritis    Asthma    Cancer (HCC)    Basal Cell Skin Cancer   Chronic back pain    COPD (chronic obstructive pulmonary disease) (HCC)    Depression    Diabetes mellitus (HCC)    Dyspnea    GERD (gastroesophageal reflux disease)    Gout    Gout    Headache    History of blood clots    Left Leg--July 2018   History of kidney stones    Hyperlipidemia    Hyperlipidemia    Hypertension    Kidney stones    Neuropathy    On home oxygen therapy    2 L / M   Pneumonia 06/2017   Sleep apnea    Ulcer of foot (HCC)    Right   Past Surgical History:  Procedure Laterality Date   APPENDECTOMY     DG FEET 2 VIEWS BILAT     IRRIGATION AND DEBRIDEMENT FOOT Left 07/11/2023   Procedure: IRRIGATION AND DEBRIDEMENT FOOT;  Surgeon: Gwyneth Revels, DPM;  Location: [facility] ORS;  Service: Orthopedics/Podiatry;  Laterality: Left;   LIPOMA EXCISION Right 08/15/2017   Procedure: EXCISION TUMOR(CYST) FOOT;  Surgeon: Recardo Evangelist, DPM;  Location: [facility] ORS;  Service: Podiatry;  Laterality: Right;   LOWER EXTREMITY ANGIOGRAPHY Left 07/03/2023   Procedure: Lower Extremity Angiography;  Surgeon: Annice Needy, MD;  Location: [facility] INVASIVE [facility];  Service: Cardiovascular;  Laterality: Left;   OTHER SURGICAL HISTORY Bilateral Foot surgery   Social History:  reports that he has quit  smoking. His smoking use included cigarettes. He started smoking about 7 years ago. He has a 3.6 pack-year smoking history. He has never used smokeless tobacco. He reports current alcohol use of about 1.0 standard drink of alcohol per week. He reports that he does not use drugs.  Allergies  Allergen Reactions   Bee Venom Anaphylaxis   Linezolid Other (See Comments)    Thrombocytopenia, hyperlactatemia   Azithromycin Itching and Rash    Family History  Problem Relation Age of Onset   Hypertension Mother     Prior to Admission medications   Medication Sig Start Date End  Date Taking? Authorizing Provider  acetaminophen (TYLENOL) 325 MG tablet Take 650 mg by mouth every 6 (six) hours as needed.   Yes [provider]  albuterol (VENTOLIN HFA) 108 (90 Base) MCG/ACT inhaler Inhale 2 puffs into the lungs every 4 (four) hours as needed for wheezing or shortness of breath.   Yes [provider]  allopurinol (ZYLOPRIM) 300 MG tablet Take 600 mg by mouth daily. 02/24/20  Yes [provider]  aspirin EC 81 MG tablet Take 1 tablet by mouth daily.   Yes [provider]  azelastine (ASTELIN) 0.1 % nasal spray Place 2 sprays into both nostrils 2 (two) times daily as needed. 05/27/23  Yes [provider]  bisacodyl (DULCOLAX) 5 MG EC tablet Take 1 tablet (5 mg total) by mouth daily as needed for moderate constipation. 09/04/23  Yes Loyce Dys, MD  bismuth subsalicylate (STOMACH RELIEF) 262 MG/15ML suspension Take 15 mLs by mouth every 6 (six) hours as needed.   Yes [provider]  Buprenorphine HCl-Naloxone HCl 8-2 MG FILM Place 1 Film under the tongue in the morning, at noon, and at bedtime.   Yes [provider]  clopidogrel (PLAVIX) 75 MG tablet Take 1 tablet (75 mg total) by mouth daily. 02/02/21  Yes Willeen Niece, MD  cyanocobalamin (VITAMIN B12) 1000 MCG/ML injection Inject 1 mL (1,000 mcg total) into the muscle every 30 (thirty) days. 07/12/22  Yes Earna Coder, MD  Dextromethorphan-guaiFENesin 10-100 MG/5ML liquid Take 5 mLs by mouth every 4 (four) hours as needed (cough). 10/01/23  Yes [provider]  DULoxetine (CYMBALTA) 30 MG capsule Take 30 mg by mouth daily. 01/21/24  Yes [provider]  ezetimibe (ZETIA) 10 MG tablet Take 10 mg by mouth daily. 02/24/20  Yes [provider]  fluticasone (FLONASE) 50 MCG/ACT nasal spray Place 1 spray into both nostrils daily as needed for allergies. 10/16/18  Yes [provider]  Fluticasone-Umeclidin-Vilant (TRELEGY ELLIPTA)  100-62.5-25 MCG/ACT AEPB Inhale 1 puff into the lungs in the morning. 09/12/21  Yes [provider]  gabapentin (NEURONTIN) 800 MG tablet Take 800 mg by mouth 4 (four) times daily. 10/22/23  Yes [provider]  ipratropium-albuterol (DUONEB) 0.5-2.5 (3) MG/3ML SOLN Take 3 mLs by nebulization every 6 (six) hours as needed (wheezing).   Yes [provider]  levalbuterol (XOPENEX) 0.63 MG/3ML nebulizer solution Take 3 mLs (0.63 mg total) by nebulization every 6 (six) hours as needed for wheezing or shortness of breath. 09/04/23  Yes Loyce Dys, MD  metFORMIN (GLUCOPHAGE) 1000 MG tablet Take 1,000 mg by mouth daily.   Yes [provider]  metoprolol succinate (TOPROL-XL) 50 MG 24 hr tablet Take 50 mg by mouth daily. 02/24/20  Yes [provider]  Multiple Vitamins-Minerals (MULTIVITAMIN ADULT, MINERALS, PO) Take 1 tablet by mouth every morning.   Yes [provider]  naloxone (NARCAN) nasal spray 4 mg/0.1 mL Place 1 spray into the nose once.   Yes [provider]  ondansetron (ZOFRAN-ODT) 4 MG disintegrating tablet Take 4 mg by mouth every 8 (eight) hours as needed for nausea.   Yes [provider]  pantoprazole (PROTONIX) 40 MG tablet Take 40 mg by mouth daily as needed. 06/20/21  Yes [provider]  roflumilast (DALIRESP) 500 MCG TABS tablet Take 500 mcg by mouth daily. 08/15/21  Yes [provider]  simvastatin (ZOCOR) 40 MG tablet Take 40 mg by mouth at bedtime. 01/17/16  Yes [provider]  traZODone (DESYREL) 50 MG tablet Take 50 mg by mouth at bedtime as needed for sleep. 01/22/24 01/21/25 Yes [provider]  Amino Acids-Protein Hydrolys (PRO-STAT SUGAR FREE PO) Take 30 mLs by mouth 2 (two) times daily.    [provider]  OXYGEN Inhale 3 L into the lungs.    [provider]  Syringe/Needle, Disp, (SYRINGE 3CC/25GX1") 25G X 1" 3 ML MISC 1 Syringe by Does not apply route every  30 (thirty) days. 07/12/22   Earna Coder, MD    Physical Exam: Vitals:   02/19/24 1911 02/19/24 1912  BP:  104/69  Pulse:  92  Resp:  (!) 22  Temp:  97.8 F (36.6 C)  TempSrc:  Oral  SpO2:  95%  Weight: 79.4 kg   Height: 5\' 7"  (1.702 m)    Physical Exam  Labs on Admission: I have personally reviewed following labs and imaging studies  CBC: Recent Labs  Lab 02/19/24 1914  WBC 13.4*  NEUTROABS 9.7*  HGB 9.1*  HCT 30.5*  MCV 87.1  PLT 219   Basic Metabolic Panel: Recent Labs  Lab 02/19/24 1914  NA 137  K 4.4  CL 96*  CO2 30  GLUCOSE 160*  BUN 21  CREATININE 1.65*  CALCIUM 9.0   GFR: Estimated Creatinine Clearance: 42.7 mL/min (A) (by C-G formula based on SCr of 1.65 mg/dL (H)). Liver Function Tests: No results for input(s): "AST", "ALT", "ALKPHOS", "BILITOT", "PROT", "ALBUMIN" in the last 168 hours. No results for input(s): "LIPASE", "AMYLASE" in the last 168 hours. No results for input(s): "AMMONIA" in the last 168 hours. Coagulation Profile: No results for input(s): "INR", "PROTIME" in the last 168 hours. Cardiac Enzymes: No results for input(s): "CKTOTAL", "CKMB", "CKMBINDEX", "TROPONINI" in the last 168 hours. BNP (last 3 results) No results for input(s): "PROBNP" in the last 8760 hours. HbA1C: No results for input(s): "HGBA1C" in the last 72 hours. CBG: No results for input(s): "GLUCAP" in the last 168 hours. Lipid Profile: No results for input(s): "CHOL", "HDL", "LDLCALC", "TRIG", "CHOLHDL", "LDLDIRECT" in the last 72 hours. Thyroid Function Tests: No results for input(s): "TSH", "T4TOTAL", "FREET4", "T3FREE", "THYROIDAB" in the last 72 hours. Anemia Panel: No results for input(s): "VITAMINB12", "FOLATE", "FERRITIN", "TIBC", "IRON", "RETICCTPCT" in the last 72 hours. Urine analysis:    Component Value Date/Time   COLORURINE YELLOW (A) 07/02/2023 1406   APPEARANCEUR CLEAR (A) 07/02/2023 1406   APPEARANCEUR Clear 03/24/2013 1853    LABSPEC 1.009 07/02/2023 1406   LABSPEC 1.020 03/24/2013 1853   PHURINE 6.0 07/02/2023 1406   GLUCOSEU NEGATIVE 07/02/2023 1406   GLUCOSEU Negative 03/24/2013 1853   HGBUR LARGE (A) 07/02/2023 1406   BILIRUBINUR NEGATIVE 07/02/2023 1406   BILIRUBINUR Negative 03/24/2013 1853   KETONESUR NEGATIVE 07/02/2023 1406   PROTEINUR NEGATIVE 07/02/2023 1406   NITRITE NEGATIVE 07/02/2023 1406   LEUKOCYTESUR NEGATIVE 07/02/2023 1406  LEUKOCYTESUR Negative 03/24/2013 1853    Radiological Exams on Admission: No results found.   Data Reviewed: Relevant notes from primary care and specialist visits, past discharge summaries as available in EHR, including Care Everywhere. Prior diagnostic testing as pertinent to current admission diagnoses Updated medications and problem lists for reconciliation ED course, including vitals, labs, imaging, treatment and response to treatment Triage notes, nursing and pharmacy notes and ED provider's notes Notable results as noted in HPI   Assessment and Plan: * Acute on chronic diastolic CHF (congestive heart failure) (HCC) Patient presents with shortness of breath, orthopnea and bilateral lower extremity edema elevated BNP to 284 Chest x-ray pending, preliminary read as vascular congestion Troponin negative, ACS not suspected IV Lasix Daily weights with intake and output monitoring Echocardiogram (echo in 2022 showed G2 DD with EF 55 to 60%)  Abnormal chest x-ray - CXR:  "Question pleural line/pneumothorax at the right apex but lung markings appear to extend beyond the questioned possible pleural line. Recommend chest CT for further assessment" The follow up CT PE and US DVT just FYI.   COPD (chronic obstructive pulmonary disease) (HCC) NSCLC/obstructive sleep apnea not on CPAP Chronic respiratory failure with hypoxia Patient hospitalized a couple weeks prior with COPD exacerbation requiring BiPAP, not currently wheezing O2 requirement at  baseline Continue home inhalers DuoNebs as needed Continue supplemental oxygen  Acute renal failure superimposed on stage 2 chronic kidney disease (HCC) Creatinine 1.65 up from 1.14 just 2 weeks prior Etiology uncertain, suspect prerenal likely ATN Monitor for worsening given IV Lasix therapy Avoid nephrotoxins  Type II diabetes mellitus with renal manifestations (HCC) Sliding scale insulin Hold home metformin and consider substitution with another agent in view of abnormal renal function  Hypertension BP controlled.  Continue home metoprolol  Chronic pain syndrome Chronic opiates Continue buprenorphine, duloxetine, gabapentin, Tylenol and trazodone nightly  Anemia Chronic and stable at 9.1  Depression Continue duloxetine and trazodone  Gout Not acutely exacerbated Currently on allopurinol  AAA (abdominal aortic aneurysm) without rupture No acute issues suspected Followed by vascular, had vascular ultrasound 02/05/2024 with diameter 3.9 cm        DVT prophylaxis: Lovenox  Consults: none  Advance Care Planning:   Code Status: Prior   Family Communication: none  Disposition Plan: Back to previous home environment  Severity of Illness: The appropriate patient status for this patient is OBSERVATION. Observation status is judged to be reasonable and necessary in order to provide the required intensity of service to ensure the patient's safety. The patient's presenting symptoms, physical exam findings, and initial radiographic and laboratory data in the context of their medical condition is felt to place them at decreased risk for further clinical deterioration. Furthermore, it is anticipated that the patient will be medically stable for discharge from the hospital within 2 midnights of admission.   Author: Andris Baumann, MD 02/19/2024 9:50 PM  For on call review [email or web address].

## 2024-02-19 NOTE — Assessment & Plan Note (Signed)
-   CXR:  "Question pleural line/pneumothorax at the right apex but lung markings appear to extend beyond the questioned possible pleural line. Recommend chest CT for further assessment" The follow up CT PE and US DVT just FYI.

## 2024-02-19 NOTE — Assessment & Plan Note (Signed)
 Patient presents with shortness of breath, orthopnea and bilateral lower extremity edema elevated BNP to 284 Chest x-ray pending, preliminary read as vascular congestion Troponin negative, ACS not suspected IV Lasix Daily weights with intake and output monitoring Echocardiogram (echo in 2022 showed G2 DD with EF 55 to 60%)

## 2024-02-19 NOTE — Assessment & Plan Note (Signed)
 Continue duloxetine and trazodone

## 2024-02-19 NOTE — Assessment & Plan Note (Signed)
 No acute issues suspected Followed by vascular, had vascular ultrasound 02/05/2024 with diameter 3.9 cm

## 2024-02-19 NOTE — Assessment & Plan Note (Signed)
 NSCLC/obstructive sleep apnea not on CPAP Chronic respiratory failure with hypoxia Patient hospitalized a couple weeks prior with COPD exacerbation requiring BiPAP, not currently wheezing O2 requirement at baseline Continue home inhalers DuoNebs as needed Continue supplemental oxygen

## 2024-02-19 NOTE — Assessment & Plan Note (Signed)
 Chronic and stable at 9.1

## 2024-02-19 NOTE — ED Triage Notes (Addendum)
 Pt reports shortness of breath that started a few days ago, pt states he has also noticed bilateral lower extremity swelling. Pt denies known hx of chf. Pt reports worsening sob with laying down. Pt denies chest pain. Pt wears 3L [place] at baseline

## 2024-02-19 NOTE — ED Provider Notes (Signed)
 [facility] Provider Note    Event Date/Time   First MD Initiated Contact with Patient 02/19/24 1930     (approximate)   History   Shortness of Breath   HPI  Christian Fields is a 68 y.o. male  COPD on 2-3 L oxygen, hypertension, hyperlipidemia, diabetes mellitus, asthma, TIA, GERD, gout, depression, OSA not on CPAP, anemia, AAA, polysubstance abuse, chronic pain syndrome on Suboxone, non-small cell lung cancer (s/p of RXT), CKD-2, who presents with SOB.   I reviewed a note where patient was admitted from a 2/28 until 3/3 for a COPD exacerbation.  According to family they noticed some increasing swelling in his bilateral legs over the past day.  She states that that was just what he told her about it but she is not sure if it had gone on longer than that.  He is not on any Lasix at home no history of heart failure.  He is also had to be using increasing oxygen requirements.  He typically supposed be on 3 but he is using 7-8 at home.  He denies any chest pain.  Physical Exam   Triage Vital Signs: ED Triage Vitals  Encounter Vitals Group     BP 02/19/24 1912 104/69     Systolic BP Percentile --      Diastolic BP Percentile --      Pulse Rate 02/19/24 1912 92     Resp 02/19/24 1912 (!) 22     Temp 02/19/24 1912 97.8 F (36.6 C)     Temp Source 02/19/24 1912 Oral     SpO2 02/19/24 1912 95 %     Weight 02/19/24 1911 175 lb (79.4 kg)     Height 02/19/24 1911 5\' 7"  (1.702 m)     Head Circumference --      Peak Flow --      Pain Score 02/19/24 1911 0     Pain Loc --      Pain Education --      Exclude from Growth Chart --     Most recent vital signs: Vitals:   02/19/24 1912  BP: 104/69  Pulse: 92  Resp: (!) 22  Temp: 97.8 F (36.6 C)  SpO2: 95%     General: Awake, no distress.  CV:  Good peripheral perfusion.  Resp:  Increased work of breathing, no wheezing Abd:  No distention.  Soft and nontender Other:  Edema noted bilaterally slightly  worse on the left.   ED Results / Procedures / Treatments   Labs (all labs ordered are listed, but only abnormal results are displayed) Labs Reviewed  CBC WITH DIFFERENTIAL/PLATELET - Abnormal; Notable for the following components:      Result Value   WBC 13.4 (*)    RBC 3.50 (*)    Hemoglobin 9.1 (*)    HCT 30.5 (*)    MCHC 29.8 (*)    RDW 16.2 (*)    Neutro Abs 9.7 (*)    Monocytes Absolute 1.1 (*)    Eosinophils Absolute 0.6 (*)    Abs Immature Granulocytes 0.12 (*)    All other components within normal limits  BASIC METABOLIC PANEL  BRAIN NATRIURETIC PEPTIDE  TROPONIN I (HIGH SENSITIVITY)     EKG  My interpretation of EKG:  Normal sinus rhythm 94 without any ST elevation or T wave inversions, normal intervals except for first-degree AV block  RADIOLOGY I have reviewed the xray personally and interpreted no evidence of obvious  pneumonia    PROCEDURES:  Critical Care performed: No  .1-3 Lead EKG Interpretation  Performed by: Concha Se, MD Authorized by: Concha Se, MD     Interpretation: normal     ECG rate:  90   ECG rate assessment: normal     Rhythm: sinus rhythm     Ectopy: none     Conduction: normal      MEDICATIONS ORDERED IN ED: Medications  allopurinol (ZYLOPRIM) tablet 600 mg (has no administration in time range)  aspirin EC tablet 81 mg (has no administration in time range)  buprenorphine-naloxone (SUBOXONE) 8-2 mg per SL tablet 1 tablet (has no administration in time range)  ezetimibe (ZETIA) tablet 10 mg (has no administration in time range)  metoprolol succinate (TOPROL-XL) 24 hr tablet 50 mg (has no administration in time range)  simvastatin (ZOCOR) tablet 40 mg (has no administration in time range)  DULoxetine (CYMBALTA) DR capsule 30 mg (has no administration in time range)  traZODone (DESYREL) tablet 50 mg (has no administration in time range)  bisacodyl (DULCOLAX) EC tablet 5 mg (has no administration in time range)  bismuth  subsalicylate (PEPTO BISMOL) 262 MG/15ML suspension 15 mL (has no administration in time range)  pantoprazole (PROTONIX) EC tablet 40 mg (has no administration in time range)  clopidogrel (PLAVIX) tablet 75 mg (has no administration in time range)  gabapentin (NEURONTIN) capsule 800 mg (has no administration in time range)  guaiFENesin-dextromethorphan (ROBITUSSIN DM) 100-10 MG/5ML syrup 5 mL (has no administration in time range)  fluticasone furoate-vilanterol (BREO ELLIPTA) 100-25 MCG/ACT 1 puff (has no administration in time range)    And  umeclidinium bromide (INCRUSE ELLIPTA) 62.5 MCG/ACT 1 puff (has no administration in time range)  levalbuterol (XOPENEX) nebulizer solution 0.63 mg (has no administration in time range)  roflumilast (DALIRESP) tablet 500 mcg (has no administration in time range)  enoxaparin (LOVENOX) injection 40 mg (has no administration in time range)  acetaminophen (TYLENOL) tablet 650 mg (has no administration in time range)    Or  acetaminophen (TYLENOL) suppository 650 mg (has no administration in time range)  ondansetron (ZOFRAN) tablet 4 mg (has no administration in time range)    Or  ondansetron (ZOFRAN) injection 4 mg (has no administration in time range)  furosemide (LASIX) injection 20 mg (has no administration in time range)  potassium chloride SA (KLOR-CON M) CR tablet 40 mEq (has no administration in time range)  furosemide (LASIX) injection 40 mg (40 mg Intravenous Given 02/19/24 2042)     IMPRESSION / MDM / ASSESSMENT AND PLAN / ED COURSE  I reviewed the triage vital signs and the nursing notes.   Patient's presentation is most consistent with acute presentation with potential threat to life or bodily function.   Patient comes in with new leg swelling bilaterally with increasing shortness of breath, work of breathing.  Differential includes CHF, PE, ACS.  No obvious wheezing at this time to suggest his COPD being the cause.  Patient's labs do show  elevated BNP has not had a recent echocardiogram and he is not on any current Lasix.  I will discuss with hospital team for admission.  His x-ray was without any obvious acute findings but later came back with a questionable IMPRESSION: 1. Emphysema and bronchitic changes. Stable irregular nodule in the peripheral left upper lobe, corresponding to history of fibrosis and treated lung lesion. 2. Question pleural line/pneumothorax at the right apex but lung markings appear to extend beyond the questioned possible  pleural line. Recommend chest CT for further assessment.  I did order a CT PE as well as DVT ultrasounds and I did alert the hospitalist about this.  If there is a pneumothorax I suspect is very small in nature but the CT scan can better delineate this.  The hospitalist is aware that she will need to follow-up with these results  The patient is on the cardiac monitor to evaluate for evidence of arrhythmia and/or significant heart rate changes.      FINAL CLINICAL IMPRESSION(S) / ED DIAGNOSES   Final diagnoses:  Acute congestive heart failure, unspecified heart failure type (HCC)     Rx / DC Orders   ED Discharge Orders     None        Note:  This document was prepared using Dragon voice recognition software and may include unintentional dictation errors.   Concha Se, MD 02/19/24 2214

## 2024-02-19 NOTE — Assessment & Plan Note (Addendum)
 Not acutely exacerbated Currently on allopurinol

## 2024-02-19 NOTE — Assessment & Plan Note (Signed)
 Sliding scale insulin Hold home metformin and consider substitution with another agent in view of abnormal renal function

## 2024-02-19 NOTE — Hospital Course (Signed)
 [name] Kitchen

## 2024-02-19 NOTE — Assessment & Plan Note (Signed)
 Chronic opiates Continue buprenorphine, duloxetine, gabapentin, Tylenol and trazodone nightly

## 2024-02-19 NOTE — Assessment & Plan Note (Signed)
BP controlled.  Continue home metoprolol

## 2024-02-19 NOTE — Assessment & Plan Note (Signed)
 Creatinine 1.65 up from 1.14 just 2 weeks prior Etiology uncertain, suspect prerenal likely ATN Monitor for worsening given IV Lasix therapy Avoid nephrotoxins

## 2024-02-20 ENCOUNTER — Inpatient Hospital Stay

## 2024-02-20 ENCOUNTER — Inpatient Hospital Stay
Admit: 2024-02-20 | Discharge: 2024-02-20 | Disposition: A | Discharge status: Home / Self Care | Attending: Internal Medicine | Admitting: Internal Medicine

## 2024-02-20 ENCOUNTER — Telehealth: Payer: Self-pay | Admitting: Oncology

## 2024-02-20 DIAGNOSIS — E785 Hyperlipidemia, unspecified: Secondary | ICD-10-CM | POA: Diagnosis present

## 2024-02-20 DIAGNOSIS — M109 Gout, unspecified: Secondary | ICD-10-CM | POA: Diagnosis present

## 2024-02-20 DIAGNOSIS — E1151 Type 2 diabetes mellitus with diabetic peripheral angiopathy without gangrene: Secondary | ICD-10-CM | POA: Diagnosis present

## 2024-02-20 DIAGNOSIS — I130 Hypertensive heart and chronic kidney disease with heart failure and stage 1 through stage 4 chronic kidney disease, or unspecified chronic kidney disease: Secondary | ICD-10-CM | POA: Diagnosis present

## 2024-02-20 DIAGNOSIS — N179 Acute kidney failure, unspecified: Secondary | ICD-10-CM | POA: Diagnosis present

## 2024-02-20 DIAGNOSIS — J9611 Chronic respiratory failure with hypoxia: Secondary | ICD-10-CM | POA: Diagnosis present

## 2024-02-20 DIAGNOSIS — F1120 Opioid dependence, uncomplicated: Secondary | ICD-10-CM | POA: Diagnosis present

## 2024-02-20 DIAGNOSIS — J441 Chronic obstructive pulmonary disease with (acute) exacerbation: Secondary | ICD-10-CM | POA: Diagnosis present

## 2024-02-20 DIAGNOSIS — C3492 Malignant neoplasm of unspecified part of left bronchus or lung: Secondary | ICD-10-CM | POA: Diagnosis present

## 2024-02-20 DIAGNOSIS — E1122 Type 2 diabetes mellitus with diabetic chronic kidney disease: Secondary | ICD-10-CM | POA: Diagnosis present

## 2024-02-20 DIAGNOSIS — F32A Depression, unspecified: Secondary | ICD-10-CM | POA: Diagnosis present

## 2024-02-20 DIAGNOSIS — Z8249 Family history of ischemic heart disease and other diseases of the circulatory system: Secondary | ICD-10-CM | POA: Diagnosis not present

## 2024-02-20 DIAGNOSIS — J440 Chronic obstructive pulmonary disease with acute lower respiratory infection: Secondary | ICD-10-CM | POA: Diagnosis present

## 2024-02-20 DIAGNOSIS — I509 Heart failure, unspecified: Secondary | ICD-10-CM

## 2024-02-20 DIAGNOSIS — J151 Pneumonia due to Pseudomonas: Secondary | ICD-10-CM | POA: Diagnosis present

## 2024-02-20 DIAGNOSIS — G894 Chronic pain syndrome: Secondary | ICD-10-CM | POA: Diagnosis present

## 2024-02-20 DIAGNOSIS — I714 Abdominal aortic aneurysm, without rupture, unspecified: Secondary | ICD-10-CM | POA: Diagnosis present

## 2024-02-20 DIAGNOSIS — I5033 Acute on chronic diastolic (congestive) heart failure: Secondary | ICD-10-CM | POA: Diagnosis present

## 2024-02-20 DIAGNOSIS — I959 Hypotension, unspecified: Secondary | ICD-10-CM | POA: Diagnosis not present

## 2024-02-20 DIAGNOSIS — N182 Chronic kidney disease, stage 2 (mild): Secondary | ICD-10-CM | POA: Diagnosis present

## 2024-02-20 DIAGNOSIS — Z7902 Long term (current) use of antithrombotics/antiplatelets: Secondary | ICD-10-CM | POA: Diagnosis not present

## 2024-02-20 DIAGNOSIS — Z7982 Long term (current) use of aspirin: Secondary | ICD-10-CM | POA: Diagnosis not present

## 2024-02-20 DIAGNOSIS — D631 Anemia in chronic kidney disease: Secondary | ICD-10-CM | POA: Diagnosis present

## 2024-02-20 DIAGNOSIS — Z7984 Long term (current) use of oral hypoglycemic drugs: Secondary | ICD-10-CM | POA: Diagnosis not present

## 2024-02-20 DIAGNOSIS — G4733 Obstructive sleep apnea (adult) (pediatric): Secondary | ICD-10-CM | POA: Diagnosis present

## 2024-02-20 LAB — CBC
HCT: 30 % — ABNORMAL LOW (ref 39.0–52.0)
Hemoglobin: 9 g/dL — ABNORMAL LOW (ref 13.0–17.0)
MCH: 26.2 pg (ref 26.0–34.0)
MCHC: 30 g/dL (ref 30.0–36.0)
MCV: 87.5 fL (ref 80.0–100.0)
Platelets: 212 10*3/uL (ref 150–400)
RBC: 3.43 MIL/uL — ABNORMAL LOW (ref 4.22–5.81)
RDW: 16.2 % — ABNORMAL HIGH (ref 11.5–15.5)
WBC: 12.5 10*3/uL — ABNORMAL HIGH (ref 4.0–10.5)
nRBC: 0 % (ref 0.0–0.2)

## 2024-02-20 LAB — BASIC METABOLIC PANEL
Anion gap: 9 (ref 5–15)
BUN: 22 mg/dL (ref 8–23)
CO2: 33 mmol/L — ABNORMAL HIGH (ref 22–32)
Calcium: 8.9 mg/dL (ref 8.9–10.3)
Chloride: 94 mmol/L — ABNORMAL LOW (ref 98–111)
Creatinine, Ser: 1.55 mg/dL — ABNORMAL HIGH (ref 0.61–1.24)
GFR, Estimated: 48 mL/min — ABNORMAL LOW (ref >60)
Glucose, Bld: 149 mg/dL — ABNORMAL HIGH (ref 70–99)
Potassium: 3.7 mmol/L (ref 3.5–5.1)
Sodium: 136 mmol/L (ref 135–145)

## 2024-02-20 LAB — TROPONIN I (HIGH SENSITIVITY): Troponin I (High Sensitivity): 8 ng/L (ref <18)

## 2024-02-20 LAB — RESP PANEL BY RT-PCR (RSV, FLU A&B, COVID)  RVPGX2
Influenza A by PCR: NEGATIVE
Influenza B by PCR: NEGATIVE
Resp Syncytial Virus by PCR: NEGATIVE
SARS Coronavirus 2 by RT PCR: NEGATIVE

## 2024-02-20 LAB — PROCALCITONIN: Procalcitonin: 0.1 ng/mL

## 2024-02-20 MED ORDER — BUPRENORPHINE HCL-NALOXONE HCL 8-2 MG SL SUBL
1.0000 | SUBLINGUAL_TABLET | Freq: Three times a day (TID) | SUBLINGUAL | Status: DC
  Administered 2024-02-20 – 2024-02-23 (×8): 1 via SUBLINGUAL
  Filled 2024-02-20 (×8): qty 1

## 2024-02-20 MED ORDER — SODIUM CHLORIDE 0.9 % IV SOLN
500.0000 mg | INTRAVENOUS | Status: DC
  Administered 2024-02-20 – 2024-02-21 (×2): 500 mg via INTRAVENOUS
  Filled 2024-02-20 (×2): qty 5

## 2024-02-20 MED ORDER — SODIUM CHLORIDE 0.9 % IV SOLN
1.0000 g | INTRAVENOUS | Status: DC
  Administered 2024-02-20 – 2024-02-21 (×2): 1 g via INTRAVENOUS
  Filled 2024-02-20 (×2): qty 10

## 2024-02-20 MED ORDER — BUPRENORPHINE HCL-NALOXONE HCL 8-2 MG SL SUBL: 1.0000 | SUBLINGUAL_TABLET | Freq: Three times a day (TID) | SUBLINGUAL | Status: DC

## 2024-02-20 NOTE — Assessment & Plan Note (Signed)
 CTA chest showing "interval development of scattered ground-glass opacity best appreciated right long and left apex suspicious for a superimposed infectious or inflammatory process. Rocephin and azithromycin Will get procalcitonin and respiratory viral panel

## 2024-02-20 NOTE — Progress Notes (Signed)
       CROSS COVER NOTE  NAME: Christian Fields MRN: [id number] DOB : 05/21/1956 ATTENDING PHYSICIAN: Lurene Shadow, MD    Date of Service   02/20/2024   HPI/Events of Note   Suboxone regimen changed to TID dosing per home regiment per patient request. Dosing verified on med req  Donnie Mesa NP [facility] Cross Cover 7pm-7am - check amion for availability Pager [phone]

## 2024-02-20 NOTE — Progress Notes (Signed)
 Heart Failure Navigator Progress Note  Assessed for Heart & Vascular TOC clinic readiness.  Patient does not meet criteria due to current [facility] patient.   Navigator will sign off at this time.  Roxy Horseman, RN, BSN [facility] Heart Failure Navigator Secure Chat Only

## 2024-02-20 NOTE — Progress Notes (Addendum)
 Progress Note    Christian Fields  ZOX:[id number] DOB: Aug 20, 1956  DOA: 02/19/2024 PCP: Cleon Dew, FNP      Brief Narrative:    Medical records reviewed and are as summarized below:  Christian Fields is a 68 y.o. male with medical history significant for COPD on 2-3 L oxygen, HTN, DM, PVD, history of TIA, chronic diastolic CHF, OSA not on CPAP,  polysubstance use disorder, chronic pain syndrome on Suboxone, non-small cell lung cancer (s/p of RXT), recently hospitalized from 2/28 to 3/3 with COPD exacerbation requiring BiPAP discharged on doxycycline for positive respiratory culture (Pseudomonas and group A strep).  He presented to the hospital with shortness of breath and bilateral lower extremity swelling. Last echo was 02/2021 showing G2 DD, EF 55 to 60% during a hospitalization with septic shock due to aspiration pneumonia at that time.   He was admitted to the hospital for acute on chronic diastolic CHF.  CTA did not show pulmonary embolism but there were some changes concerning for possible infectious process.        Assessment/Plan:   Principal Problem:   Acute on chronic diastolic CHF (congestive heart failure) (HCC) Active Problems:   Pneumonia, possible   Sleep apnea   NSCLC of left lung (HCC)   COPD (chronic obstructive pulmonary disease) (HCC)   Chronic respiratory failure with hypoxia (HCC)   Acute renal failure superimposed on stage 2 chronic kidney disease (HCC)   Type II diabetes mellitus with renal manifestations (HCC)   Hypertension   Chronic pain syndrome   Anemia   AAA (abdominal aortic aneurysm) without rupture   Gout   Depression   Body mass index is 27.41 kg/m.   Acute on chronic diastolic CHF (congestive heart failure) (HCC) Shortness of breath and leg swelling have improved.  Continue IV Lasix.  Monitor BMP, daily weight and urine output. BNP 284 2D echo is pending. Echocardiogram (echo in 2022 showed G2 DD with EF 55 to 60%)     Pneumonia, possible CTA chest showing "interval development of scattered ground-glass opacity best appreciated right long and left apex suspicious for a superimposed infectious or inflammatory process. Continue IV ceftriaxone and azithromycin Procalcitonin negative Influenza A and B, SARS-CoV-2 and RSV negative.    COPD (chronic obstructive pulmonary disease) (HCC) NSCLC/obstructive sleep apnea not on CPAP Chronic respiratory failure with hypoxia Continue bronchodilators. He is on 2 L/min oxygen which is around baseline. Patient hospitalized a couple weeks prior with COPD exacerbation requiring BiPAP,     AKI versus CKD stage IIIa Chart review shows that he has had fluctuating creatinine. Suspect CKD stage IIIa Repeat BMP tomorrow. Avoid nephrotoxins    Type II diabetes mellitus with renal manifestations (HCC) Sliding scale insulin Metformin on hold   PVD, history of TIA Continue aspirin and Plavix   AAA (abdominal aortic aneurysm) without rupture No acute issues suspected Followed by vascular, had vascular ultrasound 02/05/2024 with diameter 3.9 cm    Comorbidities include hypertension, chronic pain syndrome on chronic pain meds (buprenorphine, duloxetine, gabapentin, Tylenol), chronic anemia, depression, gout            Diet Order             Diet heart healthy/carb modified Room service appropriate? Yes; Fluid consistency: Thin  Diet effective now                            Consultants: None  Procedures: None  Medications:    allopurinol  600 mg Oral Daily   aspirin EC  81 mg Oral Daily   buprenorphine-naloxone  1 tablet Sublingual Daily   clopidogrel  75 mg Oral Daily   DULoxetine  30 mg Oral Daily   enoxaparin (LOVENOX) injection  40 mg Subcutaneous Q24H   ezetimibe  10 mg Oral Daily   fluticasone furoate-vilanterol  1 puff Inhalation Daily   And   umeclidinium bromide  1 puff Inhalation Daily   furosemide  20 mg  Intravenous BID   gabapentin  800 mg Oral QID   metoprolol succinate  50 mg Oral Daily   pantoprazole  40 mg Oral Daily   potassium chloride  40 mEq Oral Daily   roflumilast  500 mcg Oral Daily   simvastatin  40 mg Oral QHS   Continuous Infusions:  azithromycin Stopped (02/20/24 1610)   cefTRIAXone (ROCEPHIN)  IV Stopped (02/20/24 0521)     Anti-infectives (From admission, onward)    Start     Dose/Rate Route Frequency Ordered Stop   02/20/24 0515  cefTRIAXone (ROCEPHIN) 1 g in sodium chloride 0.9 % 100 mL IVPB        1 g 200 mL/hr over 30 Minutes Intravenous Every 24 hours 02/20/24 0502     02/20/24 0515  azithromycin (ZITHROMAX) 500 mg in sodium chloride 0.9 % 250 mL IVPB        500 mg 250 mL/hr over 60 Minutes Intravenous Every 24 hours 02/20/24 0502                Family Communication/Anticipated D/C date and plan/Code Status   DVT prophylaxis: enoxaparin (LOVENOX) injection 40 mg Start: 02/19/24 2200     Code Status: Full Code  Family Communication: None Disposition Plan: Plan to discharge home   Status is: Observation The patient will require care spanning > 2 midnights and should be moved to inpatient because: CHF exacerbation       Subjective:   Interval events noted.  No chest pain or shortness of breath.  Leg swelling and shortness of breath have improved.    Objective:    Vitals:   02/20/24 0500 02/20/24 0519 02/20/24 0753 02/20/24 0838  BP: 106/65  98/67 96/64  Pulse: 95   92  Resp:   20 (!) 21  Temp:  98.6 F (37 C) 98.2 F (36.8 C)   TempSrc:  Oral    SpO2: 100%  91% (!) 89%  Weight:      Height:       No data found.   Intake/Output Summary (Last 24 hours) at 02/20/2024 0856 Last data filed at 02/20/2024 9604 Gross per 24 hour  Intake 350 ml  Output 2800 ml  Net -2450 ml   Filed Weights   02/19/24 1911  Weight: 79.4 kg    Exam:  GEN: NAD SKIN: Warm and dry EYES: No pallor or icterus ENT: MMM CV: RRR PULM:  Bibasilar rales, no wheezing ABD: soft, ND, NT, +BS CNS: AAO x 3, non focal EXT: B/l pedal and ankle edema (L>R ), no tenderness       Data Reviewed:   I have personally reviewed following labs and imaging studies:  Labs: Labs show the following:   Basic Metabolic Panel: Recent Labs  Lab 02/19/24 1914 02/20/24 0409  NA 137 136  K 4.4 3.7  CL 96* 94*  CO2 30 33*  GLUCOSE 160* 149*  BUN 21 22  CREATININE 1.65* 1.55*  CALCIUM 9.0 8.9  GFR Estimated Creatinine Clearance: 45.4 mL/min (A) (by C-G formula based on SCr of 1.55 mg/dL (H)). Liver Function Tests: No results for input(s): "AST", "ALT", "ALKPHOS", "BILITOT", "PROT", "ALBUMIN" in the last 168 hours. No results for input(s): "LIPASE", "AMYLASE" in the last 168 hours. No results for input(s): "AMMONIA" in the last 168 hours. Coagulation profile No results for input(s): "INR", "PROTIME" in the last 168 hours.  CBC: Recent Labs  Lab 02/19/24 1914 02/20/24 0409  WBC 13.4* 12.5*  NEUTROABS 9.7*  --   HGB 9.1* 9.0*  HCT 30.5* 30.0*  MCV 87.1 87.5  PLT 219 212   Cardiac Enzymes: No results for input(s): "CKTOTAL", "CKMB", "CKMBINDEX", "TROPONINI" in the last 168 hours. BNP (last 3 results) No results for input(s): "PROBNP" in the last 8760 hours. CBG: No results for input(s): "GLUCAP" in the last 168 hours. D-Dimer: No results for input(s): "DDIMER" in the last 72 hours. Hgb A1c: No results for input(s): "HGBA1C" in the last 72 hours. Lipid Profile: No results for input(s): "CHOL", "HDL", "LDLCALC", "TRIG", "CHOLHDL", "LDLDIRECT" in the last 72 hours. Thyroid function studies: No results for input(s): "TSH", "T4TOTAL", "T3FREE", "THYROIDAB" in the last 72 hours.  Invalid input(s): "FREET3" Anemia work up: No results for input(s): "VITAMINB12", "FOLATE", "FERRITIN", "TIBC", "IRON", "RETICCTPCT" in the last 72 hours. Sepsis Labs: Recent Labs  Lab 02/19/24 1914 02/20/24 0408 02/20/24 0409   PROCALCITON  --  0.10  --   WBC 13.4*  --  12.5*    Microbiology Recent Results (from the past 240 hours)  Resp panel by RT-PCR (RSV, Flu A&B, Covid) Anterior Nasal Swab     Status: None   Collection Time: 02/20/24  5:53 AM   Specimen: Anterior Nasal Swab  Result Value Ref Range Status   SARS Coronavirus 2 by RT PCR NEGATIVE NEGATIVE Final    Comment: (NOTE) SARS-CoV-2 target nucleic acids are NOT DETECTED.  The SARS-CoV-2 RNA is generally detectable in upper respiratory specimens during the acute phase of infection. The lowest concentration of SARS-CoV-2 viral copies this assay can detect is 138 copies/mL. A negative result does not preclude SARS-Cov-2 infection and should not be used as the sole basis for treatment or other patient management decisions. A negative result may occur with  improper specimen collection/handling, submission of specimen other than nasopharyngeal swab, presence of viral mutation(s) within the areas targeted by this assay, and inadequate number of viral copies(<138 copies/mL). A negative result must be combined with clinical observations, patient history, and epidemiological information. The expected result is Negative.  Fact Sheet for Patients:  BloggerCourse.com  Fact Sheet for Healthcare Providers:  SeriousBroker.it  This test is no t yet approved or cleared by the [place] FDA and  has been authorized for detection and/or diagnosis of SARS-CoV-2 by FDA under an Emergency Use Authorization (EUA). This EUA will remain  in effect (meaning this test can be used) for the duration of the COVID-19 declaration under Section 564(b)(1) of the Act, 21 U.S.C.section 360bbb-3(b)(1), unless the authorization is terminated  or revoked sooner.       Influenza A by PCR NEGATIVE NEGATIVE Final   Influenza B by PCR NEGATIVE NEGATIVE Final    Comment: (NOTE) The Xpert Xpress SARS-CoV-2/FLU/RSV plus assay  is intended as an aid in the diagnosis of influenza from Nasopharyngeal swab specimens and should not be used as a sole basis for treatment. Nasal washings and aspirates are unacceptable for Xpert Xpress SARS-CoV-2/FLU/RSV testing.  Fact Sheet for Patients: BloggerCourse.com  Fact  Sheet for Healthcare Providers: SeriousBroker.it  This test is not yet approved or cleared by the [place] and has been authorized for detection and/or diagnosis of SARS-CoV-2 by FDA under an Emergency Use Authorization (EUA). This EUA will remain in effect (meaning this test can be used) for the duration of the COVID-19 declaration under Section 564(b)(1) of the Act, 21 U.S.C. section 360bbb-3(b)(1), unless the authorization is terminated or revoked.     Resp Syncytial Virus by PCR NEGATIVE NEGATIVE Final    Comment: (NOTE) Fact Sheet for Patients: BloggerCourse.com  Fact Sheet for Healthcare Providers: SeriousBroker.it  This test is not yet approved or cleared by the [place] FDA and has been authorized for detection and/or diagnosis of SARS-CoV-2 by FDA under an Emergency Use Authorization (EUA). This EUA will remain in effect (meaning this test can be used) for the duration of the COVID-19 declaration under Section 564(b)(1) of the Act, 21 U.S.C. section 360bbb-3(b)(1), unless the authorization is terminated or revoked.  Performed at [facility], [address] [place], [place] [address]     Procedures and diagnostic studies:  US Venous Img Lower Bilateral (DVT) Result Date: 02/20/2024 CLINICAL DATA:  Dyspnea, bilateral leg swelling EXAM: BILATERAL LOWER EXTREMITY VENOUS DOPPLER ULTRASOUND TECHNIQUE: Gray-scale sonography with compression, as well as color and duplex ultrasound, were performed to evaluate the deep venous system(s) from the level of the common femoral  vein through the popliteal and proximal calf veins. COMPARISON:  None Available. FINDINGS: VENOUS Normal compressibility of the common femoral, superficial femoral, and popliteal veins, as well as the visualized calf veins. Visualized portions of profunda femoral vein and great saphenous vein unremarkable. No filling defects to suggest DVT on grayscale or color Doppler imaging. Doppler waveforms show normal direction of venous flow, normal respiratory plasticity and response to augmentation. OTHER None. Limitations: none IMPRESSION: Negative. Electronically Signed   By: Helyn Numbers M.D.   On: 02/20/2024 00:26   CT Angio Chest PE W and/or Wo Contrast Result Date: 02/20/2024 CLINICAL DATA:  High probability pulmonary embolism, dyspnea, hypoxia EXAM: CT ANGIOGRAPHY CHEST WITH CONTRAST TECHNIQUE: Multidetector CT imaging of the chest was performed using the standard protocol during bolus administration of intravenous contrast. Multiplanar CT image reconstructions and MIPs were obtained to evaluate the vascular anatomy. RADIATION DOSE REDUCTION: This exam was performed according to the departmental dose-optimization program which includes automated exposure control, adjustment of the mA and/or kV according to patient size and/or use of iterative reconstruction technique. CONTRAST:  75mL OMNIPAQUE IOHEXOL 350 MG/ML SOLN COMPARISON:  None Available. FINDINGS: Cardiovascular: There is adequate opacification of the pulmonary arterial tree. No intraluminal filling defect identified to suggest acute pulmonary embolism. The central pulmonary arteries are enlarged in keeping with changes of pulmonary arterial hypertension. There is enlargement of the right ventricle in keeping with changes of elevated right pressure. Global cardiac size is within limits. Mild coronary artery calcification. No pericardial effusion. Moderate atheromatous mixed plaque within the thoracic aorta. No aortic aneurysm. Mediastinum/Nodes: Shotty  bilateral hilar adenopathy has developed, likely reactive in nature. No frankly pathologic thoracic adenopathy. Visualized thyroid is unremarkable. Esophagus. Unremarkable. Lungs/Pleura: Stable pleural-based 2.0 x 2.7 cm spiculated mass within the left upper lobe at axial image # 44/6, compatible with patient's treated pulmonary malignancy. Severe emphysema. Scattered ground-glass opacity has developed best appreciated right long and left apex suspicious for a superimposed infectious or inflammatory process. No pneumothorax or pleural effusion. Upper Abdomen: No acute abnormality. Musculoskeletal: Osseous structures are age-appropriate. No acute  bone abnormality. Erosive changes involving the left fourth rib adjacent to the spiculated mass is again identified in keeping with treated disease. Review of the MIP images confirms the above findings. IMPRESSION: 1. No pulmonary embolism. 2. Stable pleural-based spiculated mass within the left upper lobe compatible with patient's treated pulmonary malignancy. 3. Interval development of scattered ground-glass opacity best appreciated right long and left apex suspicious for a superimposed infectious or inflammatory process. 4. Mild coronary artery calcification. Morphologic changes in keeping with pulmonary arterial hypertension and elevated right pressure. Aortic Atherosclerosis (ICD10-I70.0) and Emphysema (ICD10-J43.9). Electronically Signed   By: Helyn Numbers M.D.   On: 02/20/2024 00:25   DG Chest Port 1 View Result Date: 02/19/2024 CLINICAL DATA:  Shortness of breath EXAM: PORTABLE CHEST 1 VIEW COMPARISON:  02/06/2024, CT chest 10/16/2023, 10/17/2022 FINDINGS: Emphysema. Stable irregular nodule in the peripheral left upper lobe, corresponding to history of fibrosis and treated lung lesion. Diffuse bronchitic changes. Bandlike scarring in the left lower lung. No acute airspace disease or effusion. Stable cardiomediastinal silhouette with aortic atherosclerosis.  Question pleural line at the right apex but lung markings appear to extend beyond the questioned possible pleural line. IMPRESSION: 1. Emphysema and bronchitic changes. Stable irregular nodule in the peripheral left upper lobe, corresponding to history of fibrosis and treated lung lesion. 2. Question pleural line/pneumothorax at the right apex but lung markings appear to extend beyond the questioned possible pleural line. Recommend chest CT for further assessment. Electronically Signed   By: Jasmine Pang M.D.   On: 02/19/2024 21:49               LOS: 0 days   Fransisca Shawn  [facility]   Pager on [email or web address]. If 7PM-7AM, please contact night-coverage at [email or web address]     02/20/2024, 8:56 AM

## 2024-02-20 NOTE — Progress Notes (Signed)
  Echocardiogram 2D Echocardiogram has been performed.  Delcie Roch 02/20/2024, 5:29 PM

## 2024-02-20 NOTE — Telephone Encounter (Signed)
 Pt daughter called and canceled todays iron appt as pt is in hospital.  Daughter stated the hospital did a CT with contrast and wants to know if Dr.Rao can use that scan and they can cancel the CT on 3/28?  Please advise and call pt daughter to let her know.

## 2024-02-21 DIAGNOSIS — I5033 Acute on chronic diastolic (congestive) heart failure: Secondary | ICD-10-CM | POA: Diagnosis not present

## 2024-02-21 LAB — CBC WITH DIFFERENTIAL/PLATELET
Abs Immature Granulocytes: 0.06 10*3/uL (ref 0.00–0.07)
Basophils Absolute: 0.1 10*3/uL (ref 0.0–0.1)
Basophils Relative: 1 %
Eosinophils Absolute: 0.8 10*3/uL — ABNORMAL HIGH (ref 0.0–0.5)
Eosinophils Relative: 5 %
HCT: 28.3 % — ABNORMAL LOW (ref 39.0–52.0)
Hemoglobin: 8.6 g/dL — ABNORMAL LOW (ref 13.0–17.0)
Immature Granulocytes: 0 %
Lymphocytes Relative: 16 %
Lymphs Abs: 2.3 10*3/uL (ref 0.7–4.0)
MCH: 26.2 pg (ref 26.0–34.0)
MCHC: 30.4 g/dL (ref 30.0–36.0)
MCV: 86.3 fL (ref 80.0–100.0)
Monocytes Absolute: 1.3 10*3/uL — ABNORMAL HIGH (ref 0.1–1.0)
Monocytes Relative: 9 %
Neutro Abs: 10.2 10*3/uL — ABNORMAL HIGH (ref 1.7–7.7)
Neutrophils Relative %: 69 %
Platelets: 229 10*3/uL (ref 150–400)
RBC: 3.28 MIL/uL — ABNORMAL LOW (ref 4.22–5.81)
RDW: 16.1 % — ABNORMAL HIGH (ref 11.5–15.5)
WBC: 14.7 10*3/uL — ABNORMAL HIGH (ref 4.0–10.5)
nRBC: 0 % (ref 0.0–0.2)

## 2024-02-21 LAB — BASIC METABOLIC PANEL
Anion gap: 11 (ref 5–15)
BUN: 21 mg/dL (ref 8–23)
CO2: 28 mmol/L (ref 22–32)
Calcium: 8.2 mg/dL — ABNORMAL LOW (ref 8.9–10.3)
Chloride: 95 mmol/L — ABNORMAL LOW (ref 98–111)
Creatinine, Ser: 1.63 mg/dL — ABNORMAL HIGH (ref 0.61–1.24)
GFR, Estimated: 45 mL/min — ABNORMAL LOW (ref >60)
Glucose, Bld: 109 mg/dL — ABNORMAL HIGH (ref 70–99)
Potassium: 4 mmol/L (ref 3.5–5.1)
Sodium: 134 mmol/L — ABNORMAL LOW (ref 135–145)

## 2024-02-21 LAB — ECHOCARDIOGRAM COMPLETE
Height: 67 in
S' Lateral: 3.2 cm
Weight: 2800 [oz_av]

## 2024-02-21 MED ORDER — LACTATED RINGERS IV BOLUS
500.0000 mL | Freq: Once | INTRAVENOUS | Status: AC
  Administered 2024-02-21: 500 mL via INTRAVENOUS

## 2024-02-21 MED ORDER — LEVOFLOXACIN 500 MG PO TABS
750.0000 mg | ORAL_TABLET | ORAL | Status: DC
  Administered 2024-02-21: 750 mg via ORAL
  Filled 2024-02-21: qty 2

## 2024-02-21 NOTE — Progress Notes (Signed)
 Progress Note    Christian Fields  NWG:[id number] DOB: 19-Apr-1956  DOA: 02/19/2024 PCP: Cleon Dew, FNP      Brief Narrative:    Medical records reviewed and are as summarized below:  Christian Fields is a 68 y.o. male with medical history significant for COPD on 2-3 L oxygen, HTN, DM, PVD, history of TIA, chronic diastolic CHF, OSA not on CPAP,  polysubstance use disorder, chronic pain syndrome on Suboxone, non-small cell lung cancer (s/p of RXT), recently hospitalized from 2/28 to 3/3 with COPD exacerbation requiring BiPAP discharged on doxycycline for positive respiratory culture (Pseudomonas and group A strep).  He presented to the hospital with shortness of breath and bilateral lower extremity swelling. Last echo was 02/2021 showing G2 DD, EF 55 to 60% during a hospitalization with septic shock due to aspiration pneumonia at that time.   He was admitted to the hospital for acute on chronic diastolic CHF.  CTA did not show pulmonary embolism but there were some changes concerning for possible infectious process.        Assessment/Plan:   Principal Problem:   Acute on chronic diastolic CHF (congestive heart failure) (HCC) Active Problems:   Pneumonia, possible   Sleep apnea   NSCLC of left lung (HCC)   COPD (chronic obstructive pulmonary disease) (HCC)   Chronic respiratory failure with hypoxia (HCC)   Acute renal failure superimposed on stage 2 chronic kidney disease (HCC)   Type II diabetes mellitus with renal manifestations (HCC)   Hypertension   Chronic pain syndrome   Anemia   AAA (abdominal aortic aneurysm) without rupture   Gout   Depression   Acute CHF (congestive heart failure) (HCC)   Body mass index is 26.85 kg/m.   Acute on chronic diastolic CHF (congestive heart failure) (HCC) Improved.  Discontinue IV Lasix.  Monitor BMP, daily weight and urine output. BNP 284 2D echo on 02/20/2024 showed EF estimated at 40 to 45%, LV global hypokinesis,  grade 1 diastolic dysfunction, Echocardiogram (echo in 2022 showed G2 DD with EF 55 to 60%)    Pneumonia, possible CTA chest showing "interval development of scattered ground-glass opacity best appreciated right long and left apex suspicious for a superimposed infectious or inflammatory process. Discontinue IV ceftriaxone and start oral Levaquin Discontinue IV azithromycin (patient said he has an allergy to azithromycin.  He said he had mild itching this morning but did not have any rash). Procalcitonin negative Influenza A and B, SARS-CoV-2 and RSV negative. Recent sputum culture from 02/07/2024 showed Pseudomonas     COPD (chronic obstructive pulmonary disease) (HCC) NSCLC/obstructive sleep apnea not on CPAP Chronic respiratory failure with hypoxia Continue bronchodilators. He is on 2 L/min oxygen which is around baseline. Patient hospitalized a couple weeks prior with COPD exacerbation requiring BiPAP,     AKI versus CKD stage IIIa Creatinine 1.55-1.63.  Creatinine was 1.65 on admission. Chart review shows that he has had fluctuating creatinine. Suspect CKD stage IIIa Repeat BMP tomorrow. Avoid nephrotoxins   Hypotension BP down to 81/58.  Give Ringer's lactate 500 mL bolus.  Monitor BP closely.    Type II diabetes mellitus with renal manifestations (HCC) Sliding scale insulin Metformin on hold   PVD, history of TIA Continue aspirin and Plavix   AAA (abdominal aortic aneurysm) without rupture No acute issues suspected Followed by vascular, had vascular ultrasound 02/05/2024 with diameter 3.9 cm    Comorbidities include hypertension, chronic pain syndrome on chronic pain meds (buprenorphine, duloxetine, gabapentin,  Tylenol), chronic anemia, depression, gout   Plan of care was discussed with [name], daughter, over the phone.  We discussed CHF, pneumonia and kidney disease in detail.  All her questions were answered.   Diet Order             Diet heart  healthy/carb modified Room service appropriate? Yes; Fluid consistency: Thin  Diet effective now                            Consultants: None  Procedures: None    Medications:    allopurinol  600 mg Oral Daily   aspirin EC  81 mg Oral Daily   buprenorphine-naloxone  1 tablet Sublingual TID   clopidogrel  75 mg Oral Daily   DULoxetine  30 mg Oral Daily   enoxaparin (LOVENOX) injection  40 mg Subcutaneous Q24H   ezetimibe  10 mg Oral Daily   fluticasone furoate-vilanterol  1 puff Inhalation Daily   And   umeclidinium bromide  1 puff Inhalation Daily   gabapentin  800 mg Oral QID   metoprolol succinate  50 mg Oral Daily   pantoprazole  40 mg Oral Daily   potassium chloride  40 mEq Oral Daily   roflumilast  500 mcg Oral Daily   simvastatin  40 mg Oral QHS   Continuous Infusions:  azithromycin 500 mg (02/21/24 0550)   cefTRIAXone (ROCEPHIN)  IV Stopped (02/21/24 0548)     Anti-infectives (From admission, onward)    Start     Dose/Rate Route Frequency Ordered Stop   02/20/24 0515  cefTRIAXone (ROCEPHIN) 1 g in sodium chloride 0.9 % 100 mL IVPB        1 g 200 mL/hr over 30 Minutes Intravenous Every 24 hours 02/20/24 0502     02/20/24 0515  azithromycin (ZITHROMAX) 500 mg in sodium chloride 0.9 % 250 mL IVPB        500 mg 250 mL/hr over 60 Minutes Intravenous Every 24 hours 02/20/24 0502                Family Communication/Anticipated D/C date and plan/Code Status   DVT prophylaxis: enoxaparin (LOVENOX) injection 40 mg Start: 02/19/24 2200     Code Status: Full Code  Family Communication: Plan discussed with [name], daughter, over patient's phone Disposition Plan: Plan to discharge home   Status is: Inpatient Remains inpatient appropriate because: CHF, hypotension         Subjective:   Interval events noted.  He has no complaints.  He feels better.  No shortness of breath or chest pain.  Leg swelling has improved.  However, later  in the day, nurse reported that patient was sleepy/lethargic and BP had gone down to 81/58.  Objective:    Vitals:   02/21/24 0515 02/21/24 0738 02/21/24 1230 02/21/24 1232  BP:  116/71 (!) 91/47 (!) 81/58  Pulse:  (!) 107 96 95  Resp:      Temp:  98.1 F (36.7 C) 98.2 F (36.8 C)   TempSrc:      SpO2:  91% 90% (!) 89%  Weight: 77.7 kg     Height:       No data found.   Intake/Output Summary (Last 24 hours) at 02/21/2024 1345 Last data filed at 02/21/2024 0549 Gross per 24 hour  Intake 100 ml  Output 1200 ml  Net -1100 ml   Filed Weights   02/19/24 1911 02/20/24 1815 02/21/24 0515  Weight: 79.4 kg 79.4 kg 77.7 kg    Exam:   GEN: NAD SKIN: Warm and dry EYES: No pallor or icterus ENT: MMM CV: RRR PULM: CTA B ABD: soft, ND, NT, +BS CNS: AAO x 3, non focal EXT: No edema or tenderness         Data Reviewed:   I have personally reviewed following labs and imaging studies:  Labs: Labs show the following:   Basic Metabolic Panel: Recent Labs  Lab 02/19/24 1914 02/20/24 0409 02/21/24 0324  NA 137 136 134*  K 4.4 3.7 4.0  CL 96* 94* 95*  CO2 30 33* 28  GLUCOSE 160* 149* 109*  BUN 21 22 21   CREATININE 1.65* 1.55* 1.63*  CALCIUM 9.0 8.9 8.2*   GFR Estimated Creatinine Clearance: 40 mL/min (A) (by C-G formula based on SCr of 1.63 mg/dL (H)). Liver Function Tests: No results for input(s): "AST", "ALT", "ALKPHOS", "BILITOT", "PROT", "ALBUMIN" in the last 168 hours. No results for input(s): "LIPASE", "AMYLASE" in the last 168 hours. No results for input(s): "AMMONIA" in the last 168 hours. Coagulation profile No results for input(s): "INR", "PROTIME" in the last 168 hours.  CBC: Recent Labs  Lab 02/19/24 1914 02/20/24 0409 02/21/24 0324  WBC 13.4* 12.5* 14.7*  NEUTROABS 9.7*  --  10.2*  HGB 9.1* 9.0* 8.6*  HCT 30.5* 30.0* 28.3*  MCV 87.1 87.5 86.3  PLT 219 212 229   Cardiac Enzymes: No results for input(s): "CKTOTAL", "CKMB", "CKMBINDEX",  "TROPONINI" in the last 168 hours. BNP (last 3 results) No results for input(s): "PROBNP" in the last 8760 hours. CBG: No results for input(s): "GLUCAP" in the last 168 hours. D-Dimer: No results for input(s): "DDIMER" in the last 72 hours. Hgb A1c: No results for input(s): "HGBA1C" in the last 72 hours. Lipid Profile: No results for input(s): "CHOL", "HDL", "LDLCALC", "TRIG", "CHOLHDL", "LDLDIRECT" in the last 72 hours. Thyroid function studies: No results for input(s): "TSH", "T4TOTAL", "T3FREE", "THYROIDAB" in the last 72 hours.  Invalid input(s): "FREET3" Anemia work up: No results for input(s): "VITAMINB12", "FOLATE", "FERRITIN", "TIBC", "IRON", "RETICCTPCT" in the last 72 hours. Sepsis Labs: Recent Labs  Lab 02/19/24 1914 02/20/24 0408 02/20/24 0409 02/21/24 0324  PROCALCITON  --  0.10  --   --   WBC 13.4*  --  12.5* 14.7*    Microbiology Recent Results (from the past 240 hours)  Resp panel by RT-PCR (RSV, Flu A&B, Covid) Anterior Nasal Swab     Status: None   Collection Time: 02/20/24  5:53 AM   Specimen: Anterior Nasal Swab  Result Value Ref Range Status   SARS Coronavirus 2 by RT PCR NEGATIVE NEGATIVE Final    Comment: (NOTE) SARS-CoV-2 target nucleic acids are NOT DETECTED.  The SARS-CoV-2 RNA is generally detectable in upper respiratory specimens during the acute phase of infection. The lowest concentration of SARS-CoV-2 viral copies this assay can detect is 138 copies/mL. A negative result does not preclude SARS-Cov-2 infection and should not be used as the sole basis for treatment or other patient management decisions. A negative result may occur with  improper specimen collection/handling, submission of specimen other than nasopharyngeal swab, presence of viral mutation(s) within the areas targeted by this assay, and inadequate number of viral copies(<138 copies/mL). A negative result must be combined with clinical observations, patient history, and  epidemiological information. The expected result is Negative.  Fact Sheet for Patients:  BloggerCourse.com  Fact Sheet for Healthcare Providers:  SeriousBroker.it  This test is  no t yet approved or cleared by the [place] and  has been authorized for detection and/or diagnosis of SARS-CoV-2 by FDA under an Emergency Use Authorization (EUA). This EUA will remain  in effect (meaning this test can be used) for the duration of the COVID-19 declaration under Section 564(b)(1) of the Act, 21 U.S.C.section 360bbb-3(b)(1), unless the authorization is terminated  or revoked sooner.       Influenza A by PCR NEGATIVE NEGATIVE Final   Influenza B by PCR NEGATIVE NEGATIVE Final    Comment: (NOTE) The Xpert Xpress SARS-CoV-2/FLU/RSV plus assay is intended as an aid in the diagnosis of influenza from Nasopharyngeal swab specimens and should not be used as a sole basis for treatment. Nasal washings and aspirates are unacceptable for Xpert Xpress SARS-CoV-2/FLU/RSV testing.  Fact Sheet for Patients: BloggerCourse.com  Fact Sheet for Healthcare Providers: SeriousBroker.it  This test is not yet approved or cleared by the [place] FDA and has been authorized for detection and/or diagnosis of SARS-CoV-2 by FDA under an Emergency Use Authorization (EUA). This EUA will remain in effect (meaning this test can be used) for the duration of the COVID-19 declaration under Section 564(b)(1) of the Act, 21 U.S.C. section 360bbb-3(b)(1), unless the authorization is terminated or revoked.     Resp Syncytial Virus by PCR NEGATIVE NEGATIVE Final    Comment: (NOTE) Fact Sheet for Patients: BloggerCourse.com  Fact Sheet for Healthcare Providers: SeriousBroker.it  This test is not yet approved or cleared by the [place] FDA and has been  authorized for detection and/or diagnosis of SARS-CoV-2 by FDA under an Emergency Use Authorization (EUA). This EUA will remain in effect (meaning this test can be used) for the duration of the COVID-19 declaration under Section 564(b)(1) of the Act, 21 U.S.C. section 360bbb-3(b)(1), unless the authorization is terminated or revoked.  Performed at [facility], [address] [place], [place] [address]     Procedures and diagnostic studies:  ECHOCARDIOGRAM COMPLETE Result Date: 02/21/2024    ECHOCARDIOGRAM REPORT   Patient Name:   Christian Fields Date of Exam: 02/20/2024 Medical Rec #:  [id number]      Height:       67.0 in Accession #:    [id number]     Weight:       175.0 lb Date of Birth:  [birth year]      BSA:          1.911 m Patient Age:    68 years       BP:           130/80 mmHg Patient Gender: M              HR:           80 bpm. Exam Location:  Inpatient Procedure: 2D Echo (Both Spectral and Color Flow Doppler were utilized during            procedure). Indications:     acute diastolic chf  History:         Patient has prior history of Echocardiogram examinations, most                  recent 02/28/2021. COPD; Risk Factors:polysubstance abuse,                  Diabetes, Hypertension, Dyslipidemia, Former Smoker and Sleep                  Apnea.  Sonographer:  Delcie Roch RDCS Referring Phys:  [id number] Andris Baumann Diagnosing Phys: Rozell Searing Custovic  Sonographer Comments: Image acquisition challenging due to respiratory motion and Image acquisition challenging due to COPD. IMPRESSIONS  1. Left ventricular ejection fraction, by estimation, is 40 to 45%. Left ventricular ejection fraction by PLAX is 45 %. The left ventricle has mildly decreased function. The left ventricle demonstrates global hypokinesis. Left ventricular diastolic parameters are consistent with Grade I diastolic dysfunction (impaired relaxation).  2. Right ventricular systolic function is normal. The right  ventricular size is normal.  3. The mitral valve is normal in structure. Trivial mitral valve regurgitation. No evidence of mitral stenosis.  4. The aortic valve is normal in structure. Aortic valve regurgitation is not visualized. No aortic stenosis is present.  5. The inferior vena cava is normal in size with greater than 50% respiratory variability, suggesting right atrial pressure of 3 mmHg. FINDINGS  Left Ventricle: Left ventricular ejection fraction, by estimation, is 40 to 45%. Left ventricular ejection fraction by PLAX is 45 %. The left ventricle has mildly decreased function. The left ventricle demonstrates global hypokinesis. The left ventricular internal cavity size was normal in size. There is no left ventricular hypertrophy. Left ventricular diastolic parameters are consistent with Grade I diastolic dysfunction (impaired relaxation). Right Ventricle: The right ventricular size is normal. No increase in right ventricular wall thickness. Right ventricular systolic function is normal. Left Atrium: Left atrial size was normal in size. Right Atrium: Right atrial size was normal in size. Pericardium: There is no evidence of pericardial effusion. Mitral Valve: The mitral valve is normal in structure. Trivial mitral valve regurgitation. No evidence of mitral valve stenosis. Tricuspid Valve: The tricuspid valve is normal in structure. Tricuspid valve regurgitation is trivial. Aortic Valve: The aortic valve is normal in structure. Aortic valve regurgitation is not visualized. No aortic stenosis is present. Pulmonic Valve: The pulmonic valve was normal in structure. Pulmonic valve regurgitation is not visualized. Aorta: The aortic root is normal in size and structure. Venous: The inferior vena cava is normal in size with greater than 50% respiratory variability, suggesting right atrial pressure of 3 mmHg. IAS/Shunts: No atrial level shunt detected by color flow Doppler.  LEFT VENTRICLE PLAX 2D LV EF:         Left             Diastology                ventricular     LV e' medial: 9.03 cm/s                ejection                fraction by                PLAX is 45                %. LVIDd:         4.10 cm LVIDs:         3.20 cm LV PW:         1.30 cm LV IVS:        1.30 cm LVOT diam:     1.90 cm LV SV:         48 LV SV Index:   25 LVOT Area:     2.84 cm  RIGHT VENTRICLE RV Basal diam:  3.00 cm RV S prime:     10.80 cm/s TAPSE (M-mode): 2.3 cm LEFT ATRIUM  Index        RIGHT ATRIUM           Index LA diam:        3.70 cm 1.94 cm/m   RA Area:     14.40 cm LA Vol (A2C):   23.0 ml 12.04 ml/m  RA Volume:   33.80 ml  17.69 ml/m LA Vol (A4C):   43.0 ml 22.51 ml/m LA Biplane Vol: 32.4 ml 16.96 ml/m  AORTIC VALVE LVOT Vmax:   97.20 cm/s LVOT Vmean:  64.200 cm/s LVOT VTI:    0.169 m  AORTA Ao Root diam: 3.60 cm  SHUNTS Systemic VTI:  0.17 m Systemic Diam: 1.90 cm Sabina Custovic Electronically signed by Clotilde Dieter Signature Date/Time: 02/21/2024/11:36:50 AM    Final    US Venous Img Lower Bilateral (DVT) Result Date: 02/20/2024 CLINICAL DATA:  Dyspnea, bilateral leg swelling EXAM: BILATERAL LOWER EXTREMITY VENOUS DOPPLER ULTRASOUND TECHNIQUE: Gray-scale sonography with compression, as well as color and duplex ultrasound, were performed to evaluate the deep venous system(s) from the level of the common femoral vein through the popliteal and proximal calf veins. COMPARISON:  None Available. FINDINGS: VENOUS Normal compressibility of the common femoral, superficial femoral, and popliteal veins, as well as the visualized calf veins. Visualized portions of profunda femoral vein and great saphenous vein unremarkable. No filling defects to suggest DVT on grayscale or color Doppler imaging. Doppler waveforms show normal direction of venous flow, normal respiratory plasticity and response to augmentation. OTHER None. Limitations: none IMPRESSION: Negative. Electronically Signed   By: Helyn Numbers M.D.   On: 02/20/2024  00:26   CT Angio Chest PE W and/or Wo Contrast Result Date: 02/20/2024 CLINICAL DATA:  High probability pulmonary embolism, dyspnea, hypoxia EXAM: CT ANGIOGRAPHY CHEST WITH CONTRAST TECHNIQUE: Multidetector CT imaging of the chest was performed using the standard protocol during bolus administration of intravenous contrast. Multiplanar CT image reconstructions and MIPs were obtained to evaluate the vascular anatomy. RADIATION DOSE REDUCTION: This exam was performed according to the departmental dose-optimization program which includes automated exposure control, adjustment of the mA and/or kV according to patient size and/or use of iterative reconstruction technique. CONTRAST:  75mL OMNIPAQUE IOHEXOL 350 MG/ML SOLN COMPARISON:  None Available. FINDINGS: Cardiovascular: There is adequate opacification of the pulmonary arterial tree. No intraluminal filling defect identified to suggest acute pulmonary embolism. The central pulmonary arteries are enlarged in keeping with changes of pulmonary arterial hypertension. There is enlargement of the right ventricle in keeping with changes of elevated right pressure. Global cardiac size is within limits. Mild coronary artery calcification. No pericardial effusion. Moderate atheromatous mixed plaque within the thoracic aorta. No aortic aneurysm. Mediastinum/Nodes: Shotty bilateral hilar adenopathy has developed, likely reactive in nature. No frankly pathologic thoracic adenopathy. Visualized thyroid is unremarkable. Esophagus. Unremarkable. Lungs/Pleura: Stable pleural-based 2.0 x 2.7 cm spiculated mass within the left upper lobe at axial image # 44/6, compatible with patient's treated pulmonary malignancy. Severe emphysema. Scattered ground-glass opacity has developed best appreciated right long and left apex suspicious for a superimposed infectious or inflammatory process. No pneumothorax or pleural effusion. Upper Abdomen: No acute abnormality. Musculoskeletal: Osseous  structures are age-appropriate. No acute bone abnormality. Erosive changes involving the left fourth rib adjacent to the spiculated mass is again identified in keeping with treated disease. Review of the MIP images confirms the above findings. IMPRESSION: 1. No pulmonary embolism. 2. Stable pleural-based spiculated mass within the left upper lobe compatible with patient's treated pulmonary malignancy. 3. Interval development of  scattered ground-glass opacity best appreciated right long and left apex suspicious for a superimposed infectious or inflammatory process. 4. Mild coronary artery calcification. Morphologic changes in keeping with pulmonary arterial hypertension and elevated right pressure. Aortic Atherosclerosis (ICD10-I70.0) and Emphysema (ICD10-J43.9). Electronically Signed   By: Helyn Numbers M.D.   On: 02/20/2024 00:25   DG Chest Port 1 View Result Date: 02/19/2024 CLINICAL DATA:  Shortness of breath EXAM: PORTABLE CHEST 1 VIEW COMPARISON:  02/06/2024, CT chest 10/16/2023, 10/17/2022 FINDINGS: Emphysema. Stable irregular nodule in the peripheral left upper lobe, corresponding to history of fibrosis and treated lung lesion. Diffuse bronchitic changes. Bandlike scarring in the left lower lung. No acute airspace disease or effusion. Stable cardiomediastinal silhouette with aortic atherosclerosis. Question pleural line at the right apex but lung markings appear to extend beyond the questioned possible pleural line. IMPRESSION: 1. Emphysema and bronchitic changes. Stable irregular nodule in the peripheral left upper lobe, corresponding to history of fibrosis and treated lung lesion. 2. Question pleural line/pneumothorax at the right apex but lung markings appear to extend beyond the questioned possible pleural line. Recommend chest CT for further assessment. Electronically Signed   By: Jasmine Pang M.D.   On: 02/19/2024 21:49               LOS: 1 day   Allayah Raineri  [facility]    Pager on [email or web address]. If 7PM-7AM, please contact night-coverage at [email or web address]     02/21/2024, 1:45 PM

## 2024-02-21 NOTE — Plan of Care (Signed)
  Problem: Education: Goal: Knowledge of General Education information will improve Description: Including pain rating scale, medication(s)/side effects and non-pharmacologic comfort measures Outcome: Progressing   Problem: Health Behavior/Discharge Planning: Goal: Ability to manage health-related needs will improve Outcome: Progressing   Problem: Clinical Measurements: Goal: Ability to maintain clinical measurements within normal limits will improve Outcome: Progressing Goal: Will remain free from infection Outcome: Progressing Goal: Diagnostic test results will improve Outcome: Progressing Goal: Cardiovascular complication will be avoided Outcome: Progressing   Problem: Nutrition: Goal: Adequate nutrition will be maintained Outcome: Progressing   Problem: Elimination: Goal: Will not experience complications related to urinary retention Outcome: Progressing   Problem: Pain Managment: Goal: General experience of comfort will improve and/or be controlled Outcome: Progressing   Problem: Safety: Goal: Ability to remain free from injury will improve Outcome: Progressing   Problem: Activity: Goal: Capacity to carry out activities will improve Outcome: Progressing

## 2024-02-22 DIAGNOSIS — I5033 Acute on chronic diastolic (congestive) heart failure: Secondary | ICD-10-CM | POA: Diagnosis not present

## 2024-02-22 LAB — CBC WITH DIFFERENTIAL/PLATELET
Abs Immature Granulocytes: 0.05 10*3/uL (ref 0.00–0.07)
Basophils Absolute: 0 10*3/uL (ref 0.0–0.1)
Basophils Relative: 0 %
Eosinophils Absolute: 0.5 10*3/uL (ref 0.0–0.5)
Eosinophils Relative: 5 %
HCT: 27 % — ABNORMAL LOW (ref 39.0–52.0)
Hemoglobin: 8.2 g/dL — ABNORMAL LOW (ref 13.0–17.0)
Immature Granulocytes: 1 %
Lymphocytes Relative: 14 %
Lymphs Abs: 1.4 10*3/uL (ref 0.7–4.0)
MCH: 26.2 pg (ref 26.0–34.0)
MCHC: 30.4 g/dL (ref 30.0–36.0)
MCV: 86.3 fL (ref 80.0–100.0)
Monocytes Absolute: 0.7 10*3/uL (ref 0.1–1.0)
Monocytes Relative: 7 %
Neutro Abs: 7.5 10*3/uL (ref 1.7–7.7)
Neutrophils Relative %: 73 %
Platelets: 202 10*3/uL (ref 150–400)
RBC: 3.13 MIL/uL — ABNORMAL LOW (ref 4.22–5.81)
RDW: 16.1 % — ABNORMAL HIGH (ref 11.5–15.5)
WBC: 10.2 10*3/uL (ref 4.0–10.5)
nRBC: 0 % (ref 0.0–0.2)

## 2024-02-22 LAB — BASIC METABOLIC PANEL
Anion gap: 11 (ref 5–15)
BUN: 23 mg/dL (ref 8–23)
CO2: 28 mmol/L (ref 22–32)
Calcium: 8.3 mg/dL — ABNORMAL LOW (ref 8.9–10.3)
Chloride: 96 mmol/L — ABNORMAL LOW (ref 98–111)
Creatinine, Ser: 1.4 mg/dL — ABNORMAL HIGH (ref 0.61–1.24)
GFR, Estimated: 54 mL/min — ABNORMAL LOW (ref >60)
Glucose, Bld: 146 mg/dL — ABNORMAL HIGH (ref 70–99)
Potassium: 3.9 mmol/L (ref 3.5–5.1)
Sodium: 135 mmol/L (ref 135–145)

## 2024-02-22 MED ORDER — LACTATED RINGERS IV BOLUS: 250.0000 mL | Freq: Once | INTRAVENOUS | Status: AC

## 2024-02-22 MED ORDER — FUROSEMIDE 20 MG PO TABS
20.0000 mg | ORAL_TABLET | Freq: Every day | ORAL | Status: DC
  Administered 2024-02-23: 20 mg via ORAL
  Filled 2024-02-22: qty 1

## 2024-02-22 MED ORDER — FUROSEMIDE 20 MG PO TABS: 20.0000 mg | ORAL_TABLET | Freq: Every day | ORAL | Status: DC

## 2024-02-22 MED ORDER — LACTATED RINGERS IV BOLUS
250.0000 mL | Freq: Once | INTRAVENOUS | Status: AC
  Administered 2024-02-22: 250 mL via INTRAVENOUS

## 2024-02-22 NOTE — Plan of Care (Signed)

## 2024-02-22 NOTE — Telephone Encounter (Signed)
 Please cancel ct scheduled for 3/28

## 2024-02-22 NOTE — Progress Notes (Addendum)
 Progress Note    BURCH MARCHUK  LKG:[id number] DOB: 1956/09/13  DOA: 02/19/2024 PCP: Cleon Dew, FNP      Brief Narrative:    Medical records reviewed and are as summarized below:  Christian Fields is a 68 y.o. male with medical history significant for COPD on 2-3 L oxygen, HTN, DM, PVD, history of TIA, chronic diastolic CHF, OSA not on CPAP,  polysubstance use disorder, chronic pain syndrome on Suboxone, non-small cell lung cancer (s/p of RXT), recently hospitalized from 2/28 to 3/3 with COPD exacerbation requiring BiPAP discharged on doxycycline for positive respiratory culture (Pseudomonas and group A strep).  He presented to the hospital with shortness of breath and bilateral lower extremity swelling. Last echo was 02/2021 showing G2 DD, EF 55 to 60% during a hospitalization with septic shock due to aspiration pneumonia at that time.   He was admitted to the hospital for acute on chronic diastolic CHF.  CTA did not show pulmonary embolism but there were some changes concerning for possible infectious process.        Assessment/Plan:   Principal Problem:   Acute on chronic diastolic CHF (congestive heart failure) (HCC) Active Problems:   Pneumonia, possible   Sleep apnea   NSCLC of left lung (HCC)   COPD (chronic obstructive pulmonary disease) (HCC)   Chronic respiratory failure with hypoxia (HCC)   Acute renal failure superimposed on stage 2 chronic kidney disease (HCC)   Type II diabetes mellitus with renal manifestations (HCC)   Hypertension   Chronic pain syndrome   Anemia   AAA (abdominal aortic aneurysm) without rupture   Gout   Depression   Acute CHF (congestive heart failure) (HCC)   Body mass index is 27.93 kg/m.   Acute on chronic diastolic CHF (congestive heart failure) (HCC) Improved.  Discontinue IV Lasix.  Monitor BMP, daily weight and urine output. BNP 284 2D echo on 02/20/2024 showed EF estimated at 40 to 45%, LV global hypokinesis,  grade 1 diastolic dysfunction, Echocardiogram (echo in 2022 showed G2 DD with EF 55 to 60%)    Pneumonia, possible CTA chest showing "interval development of scattered ground-glass opacity best appreciated right long and left apex suspicious for a superimposed infectious or inflammatory process. Continue Levaquin Previously received 2 doses of IV ceftriaxone and azithromycin.  He said he had mild itching to azithromycin but it was tolerable). Procalcitonin negative Influenza A and B, SARS-CoV-2 and RSV negative. Recent sputum culture from 02/07/2024 showed Pseudomonas     COPD (chronic obstructive pulmonary disease) (HCC) NSCLC/obstructive sleep apnea not on CPAP Chronic respiratory failure with hypoxia Continue bronchodilators. He is on 2 L/min oxygen which is around baseline. Patient hospitalized a couple weeks prior with COPD exacerbation requiring BiPAP,     AKI versus CKD stage IIIa Creatinine 1.55-1.63-1.40.  Creatinine was 1.65 on admission. Chart review shows that he has had fluctuating creatinine. Suspect CKD stage IIIa Repeat BMP tomorrow. Avoid nephrotoxins   Hypotension BP had improved but it dropped again to 88/57 this morning.  Hold metoprolol. Give 250 mL bolus of Ringer's lactate x 2 and monitor BP. He got 500 mL Ringer's lactate bolus on 02/21/2024.    Type II diabetes mellitus with renal manifestations (HCC) Sliding scale insulin Metformin on hold   PVD, history of TIA Continue aspirin and Plavix   AAA (abdominal aortic aneurysm) without rupture No acute issues suspected Followed by vascular, had vascular ultrasound 02/05/2024 with diameter 3.9 cm    Comorbidities include  hypertension, chronic pain syndrome on chronic pain meds (buprenorphine, duloxetine, gabapentin, Tylenol), chronic anemia, depression, gout  Plan of care was discussed with [name] over speaker phone in patient's room.   Diet Order             Diet heart healthy/carb modified  Room service appropriate? Yes; Fluid consistency: Thin  Diet effective now                            Consultants: None  Procedures: None    Medications:    allopurinol  600 mg Oral Daily   aspirin EC  81 mg Oral Daily   buprenorphine-naloxone  1 tablet Sublingual TID   clopidogrel  75 mg Oral Daily   DULoxetine  30 mg Oral Daily   enoxaparin (LOVENOX) injection  40 mg Subcutaneous Q24H   ezetimibe  10 mg Oral Daily   fluticasone furoate-vilanterol  1 puff Inhalation Daily   And   umeclidinium bromide  1 puff Inhalation Daily   [START ON 02/23/2024] furosemide  20 mg Oral Daily   gabapentin  800 mg Oral QID   levofloxacin  750 mg Oral Q48H   metoprolol succinate  50 mg Oral Daily   pantoprazole  40 mg Oral Daily   potassium chloride  40 mEq Oral Daily   roflumilast  500 mcg Oral Daily   simvastatin  40 mg Oral QHS   Continuous Infusions:     Anti-infectives (From admission, onward)    Start     Dose/Rate Route Frequency Ordered Stop   02/21/24 1500  levofloxacin (LEVAQUIN) tablet 750 mg        750 mg Oral Every 48 hours 02/21/24 1405 02/27/24 1459   02/20/24 0515  cefTRIAXone (ROCEPHIN) 1 g in sodium chloride 0.9 % 100 mL IVPB  Status:  Discontinued        1 g 200 mL/hr over 30 Minutes Intravenous Every 24 hours 02/20/24 0502 02/21/24 1405   02/20/24 0515  azithromycin (ZITHROMAX) 500 mg in sodium chloride 0.9 % 250 mL IVPB  Status:  Discontinued        500 mg 250 mL/hr over 60 Minutes Intravenous Every 24 hours 02/20/24 0502 02/21/24 1405              Family Communication/Anticipated D/C date and plan/Code Status   DVT prophylaxis: enoxaparin (LOVENOX) injection 40 mg Start: 02/19/24 2200     Code Status: Full Code  Family Communication: Plan discussed with [name], daughter, over patient's phone Disposition Plan: Plan to discharge home   Status is: Inpatient Remains inpatient appropriate because: CHF,  hypotension         Subjective:   Interval events noted.  He said "I don't feel good".  His blood pressure was low again this morning and he feels a little woozy.  Objective:    Vitals:   02/22/24 0350 02/22/24 0500 02/22/24 0908 02/22/24 1216  BP: (!) 107/93  (!) 88/57 (!) 102/48  Pulse: 91  95 87  Resp: 19   18  Temp: 97.9 F (36.6 C)  98.1 F (36.7 C) 98 F (36.7 C)  TempSrc:   Oral Oral  SpO2: 91%  96% 94%  Weight:  80.9 kg    Height:       No data found.   Intake/Output Summary (Last 24 hours) at 02/22/2024 1359 Last data filed at 02/22/2024 1216 Gross per 24 hour  Intake 990.09 ml  Output 1240  ml  Net -249.91 ml   Filed Weights   02/20/24 1815 02/21/24 0515 02/22/24 0500  Weight: 79.4 kg 77.7 kg 80.9 kg    Exam:    GEN: NAD SKIN: Warm and dry EYES: No pallor or icterus ENT: MMM CV: RRR PULM: CTA B ABD: soft, ND, NT, +BS CNS: AAO x 3, non focal EXT: No edema or tenderness      Data Reviewed:   I have personally reviewed following labs and imaging studies:  Labs: Labs show the following:   Basic Metabolic Panel: Recent Labs  Lab 02/19/24 1914 02/20/24 0409 02/21/24 0324 02/22/24 0301  NA 137 136 134* 135  K 4.4 3.7 4.0 3.9  CL 96* 94* 95* 96*  CO2 30 33* 28 28  GLUCOSE 160* 149* 109* 146*  BUN 21 22 21 23   CREATININE 1.65* 1.55* 1.63* 1.40*  CALCIUM 9.0 8.9 8.2* 8.3*   GFR Estimated Creatinine Clearance: 50.7 mL/min (A) (by C-G formula based on SCr of 1.4 mg/dL (H)). Liver Function Tests: No results for input(s): "AST", "ALT", "ALKPHOS", "BILITOT", "PROT", "ALBUMIN" in the last 168 hours. No results for input(s): "LIPASE", "AMYLASE" in the last 168 hours. No results for input(s): "AMMONIA" in the last 168 hours. Coagulation profile No results for input(s): "INR", "PROTIME" in the last 168 hours.  CBC: Recent Labs  Lab 02/19/24 1914 02/20/24 0409 02/21/24 0324 02/22/24 0301  WBC 13.4* 12.5* 14.7* 10.2  NEUTROABS  9.7*  --  10.2* 7.5  HGB 9.1* 9.0* 8.6* 8.2*  HCT 30.5* 30.0* 28.3* 27.0*  MCV 87.1 87.5 86.3 86.3  PLT 219 212 229 202   Cardiac Enzymes: No results for input(s): "CKTOTAL", "CKMB", "CKMBINDEX", "TROPONINI" in the last 168 hours. BNP (last 3 results) No results for input(s): "PROBNP" in the last 8760 hours. CBG: No results for input(s): "GLUCAP" in the last 168 hours. D-Dimer: No results for input(s): "DDIMER" in the last 72 hours. Hgb A1c: No results for input(s): "HGBA1C" in the last 72 hours. Lipid Profile: No results for input(s): "CHOL", "HDL", "LDLCALC", "TRIG", "CHOLHDL", "LDLDIRECT" in the last 72 hours. Thyroid function studies: No results for input(s): "TSH", "T4TOTAL", "T3FREE", "THYROIDAB" in the last 72 hours.  Invalid input(s): "FREET3" Anemia work up: No results for input(s): "VITAMINB12", "FOLATE", "FERRITIN", "TIBC", "IRON", "RETICCTPCT" in the last 72 hours. Sepsis Labs: Recent Labs  Lab 02/19/24 1914 02/20/24 0408 02/20/24 0409 02/21/24 0324 02/22/24 0301  PROCALCITON  --  0.10  --   --   --   WBC 13.4*  --  12.5* 14.7* 10.2    Microbiology Recent Results (from the past 240 hours)  Resp panel by RT-PCR (RSV, Flu A&B, Covid) Anterior Nasal Swab     Status: None   Collection Time: 02/20/24  5:53 AM   Specimen: Anterior Nasal Swab  Result Value Ref Range Status   SARS Coronavirus 2 by RT PCR NEGATIVE NEGATIVE Final    Comment: (NOTE) SARS-CoV-2 target nucleic acids are NOT DETECTED.  The SARS-CoV-2 RNA is generally detectable in upper respiratory specimens during the acute phase of infection. The lowest concentration of SARS-CoV-2 viral copies this assay can detect is 138 copies/mL. A negative result does not preclude SARS-Cov-2 infection and should not be used as the sole basis for treatment or other patient management decisions. A negative result may occur with  improper specimen collection/handling, submission of specimen other than  nasopharyngeal swab, presence of viral mutation(s) within the areas targeted by this assay, and inadequate number of viral  copies(<138 copies/mL). A negative result must be combined with clinical observations, patient history, and epidemiological information. The expected result is Negative.  Fact Sheet for Patients:  BloggerCourse.com  Fact Sheet for Healthcare Providers:  SeriousBroker.it  This test is no t yet approved or cleared by the [place] FDA and  has been authorized for detection and/or diagnosis of SARS-CoV-2 by FDA under an Emergency Use Authorization (EUA). This EUA will remain  in effect (meaning this test can be used) for the duration of the COVID-19 declaration under Section 564(b)(1) of the Act, 21 U.S.C.section 360bbb-3(b)(1), unless the authorization is terminated  or revoked sooner.       Influenza A by PCR NEGATIVE NEGATIVE Final   Influenza B by PCR NEGATIVE NEGATIVE Final    Comment: (NOTE) The Xpert Xpress SARS-CoV-2/FLU/RSV plus assay is intended as an aid in the diagnosis of influenza from Nasopharyngeal swab specimens and should not be used as a sole basis for treatment. Nasal washings and aspirates are unacceptable for Xpert Xpress SARS-CoV-2/FLU/RSV testing.  Fact Sheet for Patients: BloggerCourse.com  Fact Sheet for Healthcare Providers: SeriousBroker.it  This test is not yet approved or cleared by the [place] FDA and has been authorized for detection and/or diagnosis of SARS-CoV-2 by FDA under an Emergency Use Authorization (EUA). This EUA will remain in effect (meaning this test can be used) for the duration of the COVID-19 declaration under Section 564(b)(1) of the Act, 21 U.S.C. section 360bbb-3(b)(1), unless the authorization is terminated or revoked.     Resp Syncytial Virus by PCR NEGATIVE NEGATIVE Final    Comment:  (NOTE) Fact Sheet for Patients: BloggerCourse.com  Fact Sheet for Healthcare Providers: SeriousBroker.it  This test is not yet approved or cleared by the [place] FDA and has been authorized for detection and/or diagnosis of SARS-CoV-2 by FDA under an Emergency Use Authorization (EUA). This EUA will remain in effect (meaning this test can be used) for the duration of the COVID-19 declaration under Section 564(b)(1) of the Act, 21 U.S.C. section 360bbb-3(b)(1), unless the authorization is terminated or revoked.  Performed at [facility], [address] [place], [place] [address]     Procedures and diagnostic studies:  ECHOCARDIOGRAM COMPLETE Result Date: 02/21/2024    ECHOCARDIOGRAM REPORT   Patient Name:   Christian Fields Date of Exam: 02/20/2024 Medical Rec #:  [id number]      Height:       67.0 in Accession #:    [id number]     Weight:       175.0 lb Date of Birth:  02-11-1956      BSA:          1.911 m Patient Age:    68 years       BP:           130/80 mmHg Patient Gender: M              HR:           80 bpm. Exam Location:  Inpatient Procedure: 2D Echo (Both Spectral and Color Flow Doppler were utilized during            procedure). Indications:     acute diastolic chf  History:         Patient has prior history of Echocardiogram examinations, most                  recent 02/28/2021. COPD; Risk Factors:polysubstance abuse,  Diabetes, Hypertension, Dyslipidemia, Former Smoker and Sleep                  Apnea.  Sonographer:     Delcie Roch RDCS Referring Phys:  [id number] Andris Baumann Diagnosing Phys: Clotilde Dieter  Sonographer Comments: Image acquisition challenging due to respiratory motion and Image acquisition challenging due to COPD. IMPRESSIONS  1. Left ventricular ejection fraction, by estimation, is 40 to 45%. Left ventricular ejection fraction by PLAX is 45 %. The left ventricle has mildly  decreased function. The left ventricle demonstrates global hypokinesis. Left ventricular diastolic parameters are consistent with Grade I diastolic dysfunction (impaired relaxation).  2. Right ventricular systolic function is normal. The right ventricular size is normal.  3. The mitral valve is normal in structure. Trivial mitral valve regurgitation. No evidence of mitral stenosis.  4. The aortic valve is normal in structure. Aortic valve regurgitation is not visualized. No aortic stenosis is present.  5. The inferior vena cava is normal in size with greater than 50% respiratory variability, suggesting right atrial pressure of 3 mmHg. FINDINGS  Left Ventricle: Left ventricular ejection fraction, by estimation, is 40 to 45%. Left ventricular ejection fraction by PLAX is 45 %. The left ventricle has mildly decreased function. The left ventricle demonstrates global hypokinesis. The left ventricular internal cavity size was normal in size. There is no left ventricular hypertrophy. Left ventricular diastolic parameters are consistent with Grade I diastolic dysfunction (impaired relaxation). Right Ventricle: The right ventricular size is normal. No increase in right ventricular wall thickness. Right ventricular systolic function is normal. Left Atrium: Left atrial size was normal in size. Right Atrium: Right atrial size was normal in size. Pericardium: There is no evidence of pericardial effusion. Mitral Valve: The mitral valve is normal in structure. Trivial mitral valve regurgitation. No evidence of mitral valve stenosis. Tricuspid Valve: The tricuspid valve is normal in structure. Tricuspid valve regurgitation is trivial. Aortic Valve: The aortic valve is normal in structure. Aortic valve regurgitation is not visualized. No aortic stenosis is present. Pulmonic Valve: The pulmonic valve was normal in structure. Pulmonic valve regurgitation is not visualized. Aorta: The aortic root is normal in size and structure.  Venous: The inferior vena cava is normal in size with greater than 50% respiratory variability, suggesting right atrial pressure of 3 mmHg. IAS/Shunts: No atrial level shunt detected by color flow Doppler.  LEFT VENTRICLE PLAX 2D LV EF:         Left            Diastology                ventricular     LV e' medial: 9.03 cm/s                ejection                fraction by                PLAX is 45                %. LVIDd:         4.10 cm LVIDs:         3.20 cm LV PW:         1.30 cm LV IVS:        1.30 cm LVOT diam:     1.90 cm LV SV:         48 LV SV Index:   25 LVOT  Area:     2.84 cm  RIGHT VENTRICLE RV Basal diam:  3.00 cm RV S prime:     10.80 cm/s TAPSE (M-mode): 2.3 cm LEFT ATRIUM             Index        RIGHT ATRIUM           Index LA diam:        3.70 cm 1.94 cm/m   RA Area:     14.40 cm LA Vol (A2C):   23.0 ml 12.04 ml/m  RA Volume:   33.80 ml  17.69 ml/m LA Vol (A4C):   43.0 ml 22.51 ml/m LA Biplane Vol: 32.4 ml 16.96 ml/m  AORTIC VALVE LVOT Vmax:   97.20 cm/s LVOT Vmean:  64.200 cm/s LVOT VTI:    0.169 m  AORTA Ao Root diam: 3.60 cm  SHUNTS Systemic VTI:  0.17 m Systemic Diam: 1.90 cm Rozell Searing Custovic Electronically signed by Clotilde Dieter Signature Date/Time: 02/21/2024/11:36:50 AM    Final                LOS: 2 days   Kyzen Horn  [facility]   Pager on [email or web address]. If 7PM-7AM, please contact night-coverage at [email or web address]     02/22/2024, 1:59 PM

## 2024-02-22 NOTE — Plan of Care (Signed)
  Problem: Education: Goal: Knowledge of General Education information will improve Description: Including pain rating scale, medication(s)/side effects and non-pharmacologic comfort measures Outcome: Progressing   Problem: Health Behavior/Discharge Planning: Goal: Ability to manage health-related needs will improve Outcome: Progressing   Problem: Clinical Measurements: Goal: Ability to maintain clinical measurements within normal limits will improve Outcome: Progressing Goal: Diagnostic test results will improve Outcome: Progressing Goal: Respiratory complications will improve Outcome: Progressing Goal: Cardiovascular complication will be avoided Outcome: Progressing   Problem: Nutrition: Goal: Adequate nutrition will be maintained Outcome: Progressing   Problem: Coping: Goal: Level of anxiety will decrease Outcome: Progressing   Problem: Pain Managment: Goal: General experience of comfort will improve and/or be controlled Outcome: Progressing   Problem: Safety: Goal: Ability to remain free from injury will improve Outcome: Progressing   Problem: Education: Goal: Ability to demonstrate management of disease process will improve Outcome: Progressing Goal: Ability to verbalize understanding of medication therapies will improve Outcome: Progressing   Problem: Activity: Goal: Capacity to carry out activities will improve Outcome: Progressing

## 2024-02-23 DIAGNOSIS — I5033 Acute on chronic diastolic (congestive) heart failure: Secondary | ICD-10-CM | POA: Diagnosis not present

## 2024-02-23 LAB — CBC
HCT: 33.1 % — ABNORMAL LOW (ref 39.0–52.0)
Hemoglobin: 9.9 g/dL — ABNORMAL LOW (ref 13.0–17.0)
MCH: 25.3 pg — ABNORMAL LOW (ref 26.0–34.0)
MCHC: 29.9 g/dL — ABNORMAL LOW (ref 30.0–36.0)
MCV: 84.7 fL (ref 80.0–100.0)
Platelets: 251 10*3/uL (ref 150–400)
RBC: 3.91 MIL/uL — ABNORMAL LOW (ref 4.22–5.81)
RDW: 15.9 % — ABNORMAL HIGH (ref 11.5–15.5)
WBC: 8.3 10*3/uL (ref 4.0–10.5)
nRBC: 0 % (ref 0.0–0.2)

## 2024-02-23 LAB — BASIC METABOLIC PANEL
Anion gap: 10 (ref 5–15)
BUN: 23 mg/dL (ref 8–23)
CO2: 29 mmol/L (ref 22–32)
Calcium: 9.3 mg/dL (ref 8.9–10.3)
Chloride: 99 mmol/L (ref 98–111)
Creatinine, Ser: 1.48 mg/dL — ABNORMAL HIGH (ref 0.61–1.24)
GFR, Estimated: 51 mL/min — ABNORMAL LOW (ref >60)
Glucose, Bld: 115 mg/dL — ABNORMAL HIGH (ref 70–99)
Potassium: 4.1 mmol/L (ref 3.5–5.1)
Sodium: 138 mmol/L (ref 135–145)

## 2024-02-23 MED ORDER — LEVOFLOXACIN 750 MG PO TABS: 750.0000 mg | ORAL_TABLET | ORAL | 0 refills | Status: AC

## 2024-02-23 MED ORDER — FUROSEMIDE 20 MG PO TABS: 20.0000 mg | ORAL_TABLET | Freq: Every day | ORAL | 0 refills | Status: DC

## 2024-02-23 NOTE — Plan of Care (Signed)

## 2024-02-23 NOTE — Progress Notes (Signed)
 Discharge instructions and teaching provided. Pt verbalized and demonstrated understanding of provided instructions. All outstanding questions resolved. PIV removed. Cannula intact. Pt tolerated well. All belongings packed and in tow.

## 2024-02-23 NOTE — Care Management Important Message (Signed)
 Important Message  Patient Details  Name: Christian Fields MRN: [id number] Date of Birth: 11-02-1956   Important Message Given:  Yes - Medicare IM     Cristela Blue, CMA 02/23/2024, 12:03 PM

## 2024-02-23 NOTE — Progress Notes (Signed)
 Heart Failure Navigator Progress Note  Assessed for Heart & Vascular TOC clinic readiness.  Patient does not meet criteria due to current [facility] patient of Dr. Melton Alar.   Navigator will sign off at this time.  Roxy Horseman, RN, BSN [facility] Heart Failure Navigator Secure Chat Only

## 2024-02-23 NOTE — Discharge Summary (Signed)
 Physician Discharge Summary   Patient: Christian Fields MRN: [id number] DOB: [birth year]  Admit date:     02/19/2024  Discharge date: 02/23/24  Discharge Physician: Lurene Shadow   PCP: Cleon Dew, FNP   Recommendations at discharge:   Follow-up with PCP in 1 week  Discharge Diagnoses: Principal Problem:   Acute on chronic diastolic CHF (congestive heart failure) (HCC) Active Problems:   Pneumonia, possible   Sleep apnea   NSCLC of left lung (HCC)   COPD (chronic obstructive pulmonary disease) (HCC)   Chronic respiratory failure with hypoxia (HCC)   Acute renal failure superimposed on stage 2 chronic kidney disease (HCC)   Type II diabetes mellitus with renal manifestations (HCC)   Hypertension   Chronic pain syndrome   Anemia   AAA (abdominal aortic aneurysm) without rupture   Gout   Depression   Acute CHF (congestive heart failure) (HCC)  Resolved Problems:   * No resolved hospital problems. *  Hospital Course:  Christian Fields is a 68 y.o. male with medical history significant for COPD on 2-3 L oxygen, HTN, DM, PVD, history of TIA, chronic diastolic CHF, OSA not on CPAP,  polysubstance use disorder, chronic pain syndrome on Suboxone, non-small cell lung cancer (s/p of RXT), recently hospitalized from 2/28 to 3/3 with COPD exacerbation requiring BiPAP discharged on doxycycline for positive respiratory culture (Pseudomonas and group A strep).  He presented to the hospital with shortness of breath and bilateral lower extremity swelling. Last echo was 02/2021 showing G2 DD, EF 55 to 60% during a hospitalization with septic shock due to aspiration pneumonia at that time.    He was admitted to the hospital for acute on chronic diastolic CHF.  CTA did not show pulmonary embolism but there were some changes concerning for possible infectious process.   Assessment and Plan:  Acute on chronic diastolic CHF (congestive heart failure) (HCC) Improved.  He will be discharged on  Lasix 20 mg daily.   He was told he may need higher doses of Lasix or may need to take Lasix only as needed for swelling in the near future.  Close follow-up with PCP was recommended. BNP 284 2D echo on 02/20/2024 showed EF estimated at 40 to 45%, LV global hypokinesis, grade 1 diastolic dysfunction, Echocardiogram (echo in 2022 showed G2 DD with EF 55 to 60%)     Pneumonia, possible CTA chest showing "interval development of scattered ground-glass opacity best appreciated right long and left apex suspicious for a superimposed infectious or inflammatory process. Continue Levaquin for 2 more doses at discharge Previously received 2 doses of IV ceftriaxone and azithromycin.  He said he had mild itching to azithromycin but it was tolerable). Procalcitonin negative Influenza A and B, SARS-CoV-2 and RSV negative. Recent sputum culture from 02/07/2024 showed Pseudomonas       COPD (chronic obstructive pulmonary disease) (HCC) NSCLC/obstructive sleep apnea not on CPAP Chronic respiratory failure with hypoxia Continue bronchodilators. He is on 4 L/min oxygen. [name], daughter, said this is actually around his baseline. Patient hospitalized a couple weeks prior with COPD exacerbation requiring BiPAP,      AKI versus CKD stage IIIa Creatinine 1.55-1.63-1.40-1.48.  Creatinine was 1.65 on admission. Chart review shows that he has had fluctuating creatinine. Suspect CKD stage IIIa Repeat BMP in 1 week Avoid nephrotoxins     Hypotension Improved.     Type II diabetes mellitus with renal manifestations (HCC) Resume metformin at discharge     PVD, history of TIA Continue  aspirin and Plavix     AAA (abdominal aortic aneurysm) without rupture No acute issues suspected Followed by vascular, had vascular ultrasound 02/05/2024 with diameter 3.9 cm     Comorbidities include hypertension, chronic pain syndrome on chronic pain meds (buprenorphine, duloxetine, gabapentin, Tylenol), chronic  anemia, depression, gout   His condition has improved and is deemed stable for discharge to home today. Plan was discussed with the patient and [name],daughter, at the bedside      Consultants: None Procedures performed: None  Disposition: Home Diet recommendation:  Discharge Diet Orders (From admission, onward)     Start     Ordered   02/23/24 0000  Diet - low sodium heart healthy        02/23/24 0936   02/23/24 0000  Diet Carb Modified        02/23/24 0936           Cardiac and Carb modified diet DISCHARGE MEDICATION: Allergies as of 02/23/2024       Reactions   Bee Venom Anaphylaxis   Linezolid Other (See Comments)   Thrombocytopenia, hyperlactatemia   Azithromycin Itching, Rash        Medication List     STOP taking these medications    levalbuterol 0.63 MG/3ML nebulizer solution Commonly known as: XOPENEX       TAKE these medications    acetaminophen 325 MG tablet Commonly known as: TYLENOL Take 650 mg by mouth every 6 (six) hours as needed.   albuterol 108 (90 Base) MCG/ACT inhaler Commonly known as: VENTOLIN HFA Inhale 2 puffs into the lungs every 4 (four) hours as needed for wheezing or shortness of breath.   allopurinol 300 MG tablet Commonly known as: ZYLOPRIM Take 600 mg by mouth daily.   aspirin EC 81 MG tablet Take 1 tablet by mouth daily.   azelastine 0.1 % nasal spray Commonly known as: ASTELIN Place 2 sprays into both nostrils 2 (two) times daily as needed.   bisacodyl 5 MG EC tablet Generic drug: bisacodyl Take 1 tablet (5 mg total) by mouth daily as needed for moderate constipation.   Buprenorphine HCl-Naloxone HCl 8-2 MG Film Place 1 Film under the tongue in the morning, at noon, and at bedtime.   clopidogrel 75 MG tablet Commonly known as: PLAVIX Take 1 tablet (75 mg total) by mouth daily.   cyanocobalamin 1000 MCG/ML injection Commonly known as: VITAMIN B12 Inject 1 mL (1,000 mcg total) into the muscle every 30  (thirty) days.   Dextromethorphan-guaiFENesin 10-100 MG/5ML liquid Take 5 mLs by mouth every 4 (four) hours as needed (cough).   DULoxetine 30 MG capsule Commonly known as: CYMBALTA Take 30 mg by mouth daily.   ezetimibe 10 MG tablet Commonly known as: ZETIA Take 10 mg by mouth daily.   fluticasone 50 MCG/ACT nasal spray Commonly known as: FLONASE Place 1 spray into both nostrils daily as needed for allergies.   furosemide 20 MG tablet Commonly known as: LASIX Take 1 tablet (20 mg total) by mouth daily.   gabapentin 800 MG tablet Commonly known as: NEURONTIN Take 800 mg by mouth 4 (four) times daily.   ipratropium-albuterol 0.5-2.5 (3) MG/3ML Soln Commonly known as: DUONEB Take 3 mLs by nebulization every 6 (six) hours as needed (wheezing).   levofloxacin 750 MG tablet Commonly known as: LEVAQUIN Take 1 tablet (750 mg total) by mouth every other day for 2 doses.   metFORMIN 1000 MG tablet Commonly known as: GLUCOPHAGE Take 1,000 mg by mouth daily.  metoprolol succinate 50 MG 24 hr tablet Commonly known as: TOPROL-XL Take 50 mg by mouth daily.   MULTIVITAMIN ADULT (MINERALS) PO Take 1 tablet by mouth every morning.   naloxone 4 MG/0.1ML Liqd nasal spray kit Commonly known as: NARCAN Place 1 spray into the nose once.   ondansetron 4 MG disintegrating tablet Commonly known as: ZOFRAN-ODT Take 4 mg by mouth every 8 (eight) hours as needed for nausea.   OXYGEN Inhale 3 L into the lungs.   pantoprazole 40 MG tablet Commonly known as: PROTONIX Take 40 mg by mouth daily as needed.   PRO-STAT SUGAR FREE PO Take 30 mLs by mouth 2 (two) times daily.   roflumilast 500 MCG Tabs tablet Commonly known as: DALIRESP Take 500 mcg by mouth daily.   simvastatin 40 MG tablet Commonly known as: ZOCOR Take 40 mg by mouth at bedtime.   Stomach Relief 525 MG/30ML suspension Generic drug: bismuth subsalicylate Take 15 mLs by mouth every 6 (six) hours as needed.    SYRINGE 3CC/25GX1" 25G X 1" 3 ML Misc 1 Syringe by Does not apply route every 30 (thirty) days.   traZODone 50 MG tablet Commonly known as: DESYREL Take 50 mg by mouth at bedtime as needed for sleep.   Trelegy Ellipta 100-62.5-25 MCG/ACT Aepb Generic drug: Fluticasone-Umeclidin-Vilant Inhale 1 puff into the lungs in the morning.        Discharge Exam: Filed Weights   02/21/24 0515 02/22/24 0500 02/23/24 0326  Weight: 77.7 kg 80.9 kg 77.2 kg   GEN: NAD SKIN: Warm and dry EYES: No pallor or icterus ENT: MMM CV: RRR PULM: CTA B ABD: soft, ND, NT, +BS CNS: AAO x 3, non focal EXT: No edema or tenderness   Condition at discharge: good  The results of significant diagnostics from this hospitalization (including imaging, microbiology, ancillary and laboratory) are listed below for reference.   Imaging Studies: ECHOCARDIOGRAM COMPLETE Result Date: 02/21/2024    ECHOCARDIOGRAM REPORT   Patient Name:   Christian Fields Date of Exam: 02/20/2024 Medical Rec #:  [id number]      Height:       67.0 in Accession #:    [id number]     Weight:       175.0 lb Date of Birth:  06/27/1956      BSA:          1.911 m Patient Age:    68 years       BP:           130/80 mmHg Patient Gender: M              HR:           80 bpm. Exam Location:  Inpatient Procedure: 2D Echo (Both Spectral and Color Flow Doppler were utilized during            procedure). Indications:     acute diastolic chf  History:         Patient has prior history of Echocardiogram examinations, most                  recent 02/28/2021. COPD; Risk Factors:polysubstance abuse,                  Diabetes, Hypertension, Dyslipidemia, Former Smoker and Sleep                  Apnea.  Sonographer:     Delcie Roch RDCS Referring Phys:  [id number] Andris Baumann  Diagnosing Phys: Rozell Searing Custovic  Sonographer Comments: Image acquisition challenging due to respiratory motion and Image acquisition challenging due to COPD. IMPRESSIONS  1. Left ventricular  ejection fraction, by estimation, is 40 to 45%. Left ventricular ejection fraction by PLAX is 45 %. The left ventricle has mildly decreased function. The left ventricle demonstrates global hypokinesis. Left ventricular diastolic parameters are consistent with Grade I diastolic dysfunction (impaired relaxation).  2. Right ventricular systolic function is normal. The right ventricular size is normal.  3. The mitral valve is normal in structure. Trivial mitral valve regurgitation. No evidence of mitral stenosis.  4. The aortic valve is normal in structure. Aortic valve regurgitation is not visualized. No aortic stenosis is present.  5. The inferior vena cava is normal in size with greater than 50% respiratory variability, suggesting right atrial pressure of 3 mmHg. FINDINGS  Left Ventricle: Left ventricular ejection fraction, by estimation, is 40 to 45%. Left ventricular ejection fraction by PLAX is 45 %. The left ventricle has mildly decreased function. The left ventricle demonstrates global hypokinesis. The left ventricular internal cavity size was normal in size. There is no left ventricular hypertrophy. Left ventricular diastolic parameters are consistent with Grade I diastolic dysfunction (impaired relaxation). Right Ventricle: The right ventricular size is normal. No increase in right ventricular wall thickness. Right ventricular systolic function is normal. Left Atrium: Left atrial size was normal in size. Right Atrium: Right atrial size was normal in size. Pericardium: There is no evidence of pericardial effusion. Mitral Valve: The mitral valve is normal in structure. Trivial mitral valve regurgitation. No evidence of mitral valve stenosis. Tricuspid Valve: The tricuspid valve is normal in structure. Tricuspid valve regurgitation is trivial. Aortic Valve: The aortic valve is normal in structure. Aortic valve regurgitation is not visualized. No aortic stenosis is present. Pulmonic Valve: The pulmonic valve was  normal in structure. Pulmonic valve regurgitation is not visualized. Aorta: The aortic root is normal in size and structure. Venous: The inferior vena cava is normal in size with greater than 50% respiratory variability, suggesting right atrial pressure of 3 mmHg. IAS/Shunts: No atrial level shunt detected by color flow Doppler.  LEFT VENTRICLE PLAX 2D LV EF:         Left            Diastology                ventricular     LV Christian' medial: 9.03 cm/s                ejection                fraction by                PLAX is 45                %. LVIDd:         4.10 cm LVIDs:         3.20 cm LV PW:         1.30 cm LV IVS:        1.30 cm LVOT diam:     1.90 cm LV SV:         48 LV SV Index:   25 LVOT Area:     2.84 cm  RIGHT VENTRICLE RV Basal diam:  3.00 cm RV S prime:     10.80 cm/s TAPSE (M-mode): 2.3 cm LEFT ATRIUM  Index        RIGHT ATRIUM           Index LA diam:        3.70 cm 1.94 cm/m   RA Area:     14.40 cm LA Vol (A2C):   23.0 ml 12.04 ml/m  RA Volume:   33.80 ml  17.69 ml/m LA Vol (A4C):   43.0 ml 22.51 ml/m LA Biplane Vol: 32.4 ml 16.96 ml/m  AORTIC VALVE LVOT Vmax:   97.20 cm/s LVOT Vmean:  64.200 cm/s LVOT VTI:    0.169 m  AORTA Ao Root diam: 3.60 cm  SHUNTS Systemic VTI:  0.17 m Systemic Diam: 1.90 cm Sabina Custovic Electronically signed by Clotilde Dieter Signature Date/Time: 02/21/2024/11:36:50 AM    Final    US Venous Img Lower Bilateral (DVT) Result Date: 02/20/2024 CLINICAL DATA:  Dyspnea, bilateral leg swelling EXAM: BILATERAL LOWER EXTREMITY VENOUS DOPPLER ULTRASOUND TECHNIQUE: Gray-scale sonography with compression, as well as color and duplex ultrasound, were performed to evaluate the deep venous system(s) from the level of the common femoral vein through the popliteal and proximal calf veins. COMPARISON:  None Available. FINDINGS: VENOUS Normal compressibility of the common femoral, superficial femoral, and popliteal veins, as well as the visualized calf veins. Visualized  portions of profunda femoral vein and great saphenous vein unremarkable. No filling defects to suggest DVT on grayscale or color Doppler imaging. Doppler waveforms show normal direction of venous flow, normal respiratory plasticity and response to augmentation. OTHER None. Limitations: none IMPRESSION: Negative. Electronically Signed   By: Helyn Numbers M.D.   On: 02/20/2024 00:26   CT Angio Chest PE W and/or Wo Contrast Result Date: 02/20/2024 CLINICAL DATA:  High probability pulmonary embolism, dyspnea, hypoxia EXAM: CT ANGIOGRAPHY CHEST WITH CONTRAST TECHNIQUE: Multidetector CT imaging of the chest was performed using the standard protocol during bolus administration of intravenous contrast. Multiplanar CT image reconstructions and MIPs were obtained to evaluate the vascular anatomy. RADIATION DOSE REDUCTION: This exam was performed according to the departmental dose-optimization program which includes automated exposure control, adjustment of the mA and/or kV according to patient size and/or use of iterative reconstruction technique. CONTRAST:  75mL OMNIPAQUE IOHEXOL 350 MG/ML SOLN COMPARISON:  None Available. FINDINGS: Cardiovascular: There is adequate opacification of the pulmonary arterial tree. No intraluminal filling defect identified to suggest acute pulmonary embolism. The central pulmonary arteries are enlarged in keeping with changes of pulmonary arterial hypertension. There is enlargement of the right ventricle in keeping with changes of elevated right pressure. Global cardiac size is within limits. Mild coronary artery calcification. No pericardial effusion. Moderate atheromatous mixed plaque within the thoracic aorta. No aortic aneurysm. Mediastinum/Nodes: Shotty bilateral hilar adenopathy has developed, likely reactive in nature. No frankly pathologic thoracic adenopathy. Visualized thyroid is unremarkable. Esophagus. Unremarkable. Lungs/Pleura: Stable pleural-based 2.0 x 2.7 cm spiculated mass  within the left upper lobe at axial image # 44/6, compatible with patient's treated pulmonary malignancy. Severe emphysema. Scattered ground-glass opacity has developed best appreciated right long and left apex suspicious for a superimposed infectious or inflammatory process. No pneumothorax or pleural effusion. Upper Abdomen: No acute abnormality. Musculoskeletal: Osseous structures are age-appropriate. No acute bone abnormality. Erosive changes involving the left fourth rib adjacent to the spiculated mass is again identified in keeping with treated disease. Review of the MIP images confirms the above findings. IMPRESSION: 1. No pulmonary embolism. 2. Stable pleural-based spiculated mass within the left upper lobe compatible with patient's treated pulmonary malignancy. 3. Interval development of  scattered ground-glass opacity best appreciated right long and left apex suspicious for a superimposed infectious or inflammatory process. 4. Mild coronary artery calcification. Morphologic changes in keeping with pulmonary arterial hypertension and elevated right pressure. Aortic Atherosclerosis (ICD10-I70.0) and Emphysema (ICD10-J43.9). Electronically Signed   By: Helyn Numbers M.D.   On: 02/20/2024 00:25   DG Chest Port 1 View Result Date: 02/19/2024 CLINICAL DATA:  Shortness of breath EXAM: PORTABLE CHEST 1 VIEW COMPARISON:  02/06/2024, CT chest 10/16/2023, 10/17/2022 FINDINGS: Emphysema. Stable irregular nodule in the peripheral left upper lobe, corresponding to history of fibrosis and treated lung lesion. Diffuse bronchitic changes. Bandlike scarring in the left lower lung. No acute airspace disease or effusion. Stable cardiomediastinal silhouette with aortic atherosclerosis. Question pleural line at the right apex but lung markings appear to extend beyond the questioned possible pleural line. IMPRESSION: 1. Emphysema and bronchitic changes. Stable irregular nodule in the peripheral left upper lobe, corresponding  to history of fibrosis and treated lung lesion. 2. Question pleural line/pneumothorax at the right apex but lung markings appear to extend beyond the questioned possible pleural line. Recommend chest CT for further assessment. Electronically Signed   By: Jasmine Pang M.D.   On: 02/19/2024 21:49   DG Chest Port 1 View Result Date: 02/06/2024 CLINICAL DATA:  Shortness of breath EXAM: PORTABLE CHEST 1 VIEW COMPARISON:  X-ray and CT 10/16/2023 FINDINGS: The left inferior costophrenic angle is clipped off the edge of the film. Hyperinflation with chronic lung changes. There is a spiculated nodular focus once again left upper lobe. Please correlate with prior CT scan. Otherwise no consolidation, pneumothorax, effusion or edema. Normal cardiopericardial silhouette with calcified aorta. There is fullness of both hila which are unchanged from previous. These could be enlarged pulmonary arteries based on the prior CT scan IMPRESSION: Hyperinflation with chronic changes. Enlarged presumed pulmonary arteries with fullness of the lung hila. Stable spiculated left upper lobe lung nodule. Please correlate with prior CT. Electronically Signed   By: Karen Kays M.D.   On: 02/06/2024 18:14   VAS [place] ABI WITH/WO TBI Result Date: 02/06/2024  LOWER EXTREMITY DOPPLER STUDY Patient Name:  Christian Fields  Date of Exam:   02/05/2024 Medical Rec #: [id number]       Accession #:    [id number] Date of Birth: [birth year]       Patient Gender: M Patient Age:   57 years Exam Location:  [facility] Procedure:      VAS [place] ABI WITH/WO TBI Referring Phys: --------------------------------------------------------------------------------  Indications: Peripheral artery disease, and Pain swerlling.  Vascular Interventions: 07/03/2023 Percutaneous transluminal angioplasty of left                         SFA with 6 mm diameter by 15 cm length Lutonix                         drug-coated angioplasty balloon                         5. Stent  placement to the left SFA with 7 mm diameter by                         10 cm length life stent. Comparison Study: 11/2020 Performing Technologist: Salvadore Farber RVT  Examination Guidelines: A complete evaluation includes at minimum, Doppler waveform signals and systolic blood  pressure reading at the level of bilateral brachial, anterior tibial, and posterior tibial arteries, when vessel segments are accessible. Bilateral testing is considered an integral part of a complete examination. Photoelectric Plethysmograph (PPG) waveforms and toe systolic pressure readings are included as required and additional duplex testing as needed. Limited examinations for reoccurring indications may be performed as noted.  ABI Findings: +---------+------------------+-----+---------+--------+ Right    Rt Pressure (mmHg)IndexWaveform Comment  +---------+------------------+-----+---------+--------+ Brachial 129                                      +---------+------------------+-----+---------+--------+ PTA      143               1.10 biphasic          +---------+------------------+-----+---------+--------+ DP       152               1.17 triphasic         +---------+------------------+-----+---------+--------+ Great Toe92                0.71 Abnormal          +---------+------------------+-----+---------+--------+ +---------+------------------+-----+---------+-------+ Left     Lt Pressure (mmHg)IndexWaveform Comment +---------+------------------+-----+---------+-------+ Brachial 130                                     +---------+------------------+-----+---------+-------+ PTA      141               1.08 triphasic        +---------+------------------+-----+---------+-------+ DP       140               1.08 triphasic        +---------+------------------+-----+---------+-------+ Great Toe103               0.79 Abnormal         +---------+------------------+-----+---------+-------+  +-------+-----------+-----------+------------+------------+ ABI/TBIToday's ABIToday's TBIPrevious ABIPrevious TBI +-------+-----------+-----------+------------+------------+ Right  1.17       .71        1.13        1.21         +-------+-----------+-----------+------------+------------+ Left   1.08       .79        1.01        .92          +-------+-----------+-----------+------------+------------+ Bilateral ABIs appear essentially unchanged compared to prior study on 2022.  Summary: Right: Resting right ankle-brachial index is within normal range. The right toe-brachial index is normal. Left: Resting left ankle-brachial index is within normal range. The left toe-brachial index is normal. *See table(s) above for measurements and observations.  Electronically signed by Festus Barren MD on 02/06/2024 at 11:53:43 AM.    Final    VAS [place] AAA DUPLEX Result Date: 02/06/2024 ABDOMINAL AORTA STUDY Patient Name:  Christian Fields  Date of Exam:   02/05/2024 Medical Rec #: [id number]       Accession #:    [id number] Date of Birth: 12/29/1955       Patient Gender: M Patient Age:   19 years Exam Location:  [facility] Procedure:      VAS [place] AAA DUPLEX Referring Phys: Festus Barren --------------------------------------------------------------------------------  Comparison Study: CT from 2022 = 3.8cm distal AAA Performing Technologist: Salvadore Farber RVT  Examination Guidelines: A complete evaluation includes B-mode  imaging, spectral Doppler, color Doppler, and power Doppler as needed of all accessible portions of each vessel. Bilateral testing is considered an integral part of a complete examination. Limited examinations for reoccurring indications may be performed as noted.  Abdominal Aorta Findings: +-----------+-------+----------+----------+--------+--------+--------+ Location   AP (cm)Trans (cm)PSV (cm/s)WaveformThrombusComments +-----------+-------+----------+----------+--------+--------+--------+  Proximal   2.58   2.47      63                                 +-----------+-------+----------+----------+--------+--------+--------+ Mid        2.11   1.87      51                                 +-----------+-------+----------+----------+--------+--------+--------+ Distal     3.90   3.73      40                                 +-----------+-------+----------+----------+--------+--------+--------+ RT CIA Prox1.2    1.2       163                                +-----------+-------+----------+----------+--------+--------+--------+ LT CIA Prox1.3    1.2       106                                +-----------+-------+----------+----------+--------+--------+--------+  Summary: Abdominal Aorta: There 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 The largest aortic measurement is 3.9 cm. The largest aortic diameter has increased compared to prior exam. Previous diameter measurement was 3.8 cm obtained on 02/2021 CT.  *See table(s) above for measurements and observations.  Electronically signed by Festus Barren MD on 02/06/2024 at 11:53:34 AM.    Final     Microbiology: Results for orders placed or performed during the hospital encounter of 02/19/24  Resp panel by RT-PCR (RSV, Flu A&B, Covid) Anterior Nasal Swab     Status: None   Collection Time: 02/20/24  5:53 AM   Specimen: Anterior Nasal Swab  Result Value Ref Range Status   SARS Coronavirus 2 by RT PCR NEGATIVE NEGATIVE Final    Comment: (NOTE) SARS-CoV-2 target nucleic acids are NOT DETECTED.  The SARS-CoV-2 RNA is generally detectable in upper respiratory specimens during the acute phase of infection. The lowest concentration of SARS-CoV-2 viral copies this assay can detect is 138 copies/mL. A negative result does not preclude SARS-Cov-2 infection and should not be used as the sole basis for treatment or other patient management decisions. A negative result may occur with  improper specimen  collection/handling, submission of specimen other than nasopharyngeal swab, presence of viral mutation(s) within the areas targeted by this assay, and inadequate number of viral copies(<138 copies/mL). A negative result must be combined with clinical observations, patient history, and epidemiological information. The expected result is Negative.  Fact Sheet for Patients:  BloggerCourse.com  Fact Sheet for Healthcare Providers:  SeriousBroker.it  This test is no t yet approved or cleared by the [place] FDA and  has been authorized for detection and/or diagnosis of SARS-CoV-2 by FDA under an Emergency Use Authorization (EUA). This EUA will remain  in effect (meaning  this test can be used) for the duration of the COVID-19 declaration under Section 564(b)(1) of the Act, 21 U.S.C.section 360bbb-3(b)(1), unless the authorization is terminated  or revoked sooner.       Influenza A by PCR NEGATIVE NEGATIVE Final   Influenza B by PCR NEGATIVE NEGATIVE Final    Comment: (NOTE) The Xpert Xpress SARS-CoV-2/FLU/RSV plus assay is intended as an aid in the diagnosis of influenza from Nasopharyngeal swab specimens and should not be used as a sole basis for treatment. Nasal washings and aspirates are unacceptable for Xpert Xpress SARS-CoV-2/FLU/RSV testing.  Fact Sheet for Patients: BloggerCourse.com  Fact Sheet for Healthcare Providers: SeriousBroker.it  This test is not yet approved or cleared by the [place] FDA and has been authorized for detection and/or diagnosis of SARS-CoV-2 by FDA under an Emergency Use Authorization (EUA). This EUA will remain in effect (meaning this test can be used) for the duration of the COVID-19 declaration under Section 564(b)(1) of the Act, 21 U.S.C. section 360bbb-3(b)(1), unless the authorization is terminated or revoked.     Resp Syncytial  Virus by PCR NEGATIVE NEGATIVE Final    Comment: (NOTE) Fact Sheet for Patients: BloggerCourse.com  Fact Sheet for Healthcare Providers: SeriousBroker.it  This test is not yet approved or cleared by the [place] FDA and has been authorized for detection and/or diagnosis of SARS-CoV-2 by FDA under an Emergency Use Authorization (EUA). This EUA will remain in effect (meaning this test can be used) for the duration of the COVID-19 declaration under Section 564(b)(1) of the Act, 21 U.S.C. section 360bbb-3(b)(1), unless the authorization is terminated or revoked.  Performed at [facility], [address] [place], [place] [address]     Labs: CBC: Recent Labs  Lab 02/19/24 1914 02/20/24 0409 02/21/24 0324 02/22/24 0301 02/23/24 0517  WBC 13.4* 12.5* 14.7* 10.2 8.3  NEUTROABS 9.7*  --  10.2* 7.5  --   HGB 9.1* 9.0* 8.6* 8.2* 9.9*  HCT 30.5* 30.0* 28.3* 27.0* 33.1*  MCV 87.1 87.5 86.3 86.3 84.7  PLT 219 212 229 202 251   Basic Metabolic Panel: Recent Labs  Lab 02/19/24 1914 02/20/24 0409 02/21/24 0324 02/22/24 0301 02/23/24 0517  NA 137 136 134* 135 138  K 4.4 3.7 4.0 3.9 4.1  CL 96* 94* 95* 96* 99  CO2 30 33* 28 28 29   GLUCOSE 160* 149* 109* 146* 115*  BUN 21 22 21 23 23   CREATININE 1.65* 1.55* 1.63* 1.40* 1.48*  CALCIUM 9.0 8.9 8.2* 8.3* 9.3   Liver Function Tests: No results for input(s): "AST", "ALT", "ALKPHOS", "BILITOT", "PROT", "ALBUMIN" in the last 168 hours. CBG: No results for input(s): "GLUCAP" in the last 168 hours.  Discharge time spent: greater than 30 minutes.  Signed: Lurene Shadow, MD [facility] 02/23/2024

## 2024-02-27 ENCOUNTER — Inpatient Hospital Stay: Attending: Oncology

## 2024-03-05 ENCOUNTER — Telehealth: Payer: Self-pay | Admitting: Oncology

## 2024-03-05 ENCOUNTER — Ambulatory Visit: Payer: 59

## 2024-03-05 NOTE — Telephone Encounter (Signed)
 Patients daughter called to reschedule missed iron infusions. Please advise on how to reschedule his appointments

## 2024-03-05 NOTE — Telephone Encounter (Signed)
 Whatever iv iron he was supposed to get he can get it now

## 2024-03-12 ENCOUNTER — Inpatient Hospital Stay: Attending: Oncology

## 2024-03-12 VITALS — BP 96/62 | HR 77 | Temp 97.7°F | Resp 18

## 2024-03-12 DIAGNOSIS — Z85118 Personal history of other malignant neoplasm of bronchus and lung: Secondary | ICD-10-CM | POA: Diagnosis present

## 2024-03-12 DIAGNOSIS — Z87891 Personal history of nicotine dependence: Secondary | ICD-10-CM | POA: Diagnosis not present

## 2024-03-12 DIAGNOSIS — D509 Iron deficiency anemia, unspecified: Secondary | ICD-10-CM | POA: Insufficient documentation

## 2024-03-12 DIAGNOSIS — D508 Other iron deficiency anemias: Secondary | ICD-10-CM

## 2024-03-12 MED ORDER — SODIUM CHLORIDE 0.9 % IV SOLN
510.0000 mg | INTRAVENOUS | Status: DC
  Administered 2024-03-12: 510 mg via INTRAVENOUS
  Filled 2024-03-12: qty 510

## 2024-03-19 ENCOUNTER — Inpatient Hospital Stay

## 2024-03-24 ENCOUNTER — Inpatient Hospital Stay: Payer: 59 | Admitting: Oncology

## 2024-03-26 ENCOUNTER — Inpatient Hospital Stay

## 2024-03-26 VITALS — BP 90/63 | HR 76 | Temp 96.9°F | Resp 18

## 2024-03-26 DIAGNOSIS — D509 Iron deficiency anemia, unspecified: Secondary | ICD-10-CM | POA: Diagnosis not present

## 2024-03-26 DIAGNOSIS — D508 Other iron deficiency anemias: Secondary | ICD-10-CM

## 2024-03-26 MED ORDER — SODIUM CHLORIDE 0.9 % IV SOLN
510.0000 mg | INTRAVENOUS | Status: DC
  Administered 2024-03-26: 510 mg via INTRAVENOUS
  Filled 2024-03-26: qty 510

## 2024-03-26 MED ORDER — SODIUM CHLORIDE 0.9 % IV SOLN
INTRAVENOUS | Status: DC
  Filled 2024-03-26: qty 250

## 2024-03-26 NOTE — Patient Instructions (Signed)

## 2024-04-02 ENCOUNTER — Encounter: Payer: Self-pay | Admitting: Oncology

## 2024-04-02 ENCOUNTER — Inpatient Hospital Stay (HOSPITAL_BASED_OUTPATIENT_CLINIC_OR_DEPARTMENT_OTHER): Admitting: Oncology

## 2024-04-02 DIAGNOSIS — Z85118 Personal history of other malignant neoplasm of bronchus and lung: Secondary | ICD-10-CM

## 2024-04-02 DIAGNOSIS — Z08 Encounter for follow-up examination after completed treatment for malignant neoplasm: Secondary | ICD-10-CM | POA: Diagnosis not present

## 2024-04-02 DIAGNOSIS — D508 Other iron deficiency anemias: Secondary | ICD-10-CM

## 2024-04-02 DIAGNOSIS — D509 Iron deficiency anemia, unspecified: Secondary | ICD-10-CM | POA: Diagnosis not present

## 2024-04-02 NOTE — Progress Notes (Signed)
 Hematology/Oncology Consult note [facility]  Telephone:([phone] [phone]  Patient Care Team: Suszanne Eriksson, FNP as PCP - General (Family Medicine) Drake Gens, RN as Oncology Nurse Navigator Glenis Langdon, MD as Referring Physician (Radiation Oncology) Avonne Boettcher, MD as Consulting Physician (Hematology and Oncology)   Name of the patient: Christian Fields  [id number]  04-06-1956   Date of visit: 04/02/24  Diagnosis- stage I lung cancer s/p radiation Normocytic anemia with component of iron  deficiency    Chief complaint/ Reason for visit- routine f/u of lung cancer and iron  deficiency  Heme/Onc history: Patient is a 68 year old male who was admitted to the hospital in April 2021 for community-acquired pneumonia and as a part of the work-up he had a CT angio chest done which incidentally showed a 1 cm irregular left upper lobe lung nodule repeat CT scan in August 2021 showed an interval increase in the size from 1 to 1.4 cm.  No appreciable thoracic adenopathy.  Severe centrilobular emphysema.  Chronic pulmonary hypertension.  Patient has been seen by Dr. Jamal Mays and has been considered to be high risk for biopsy from their standpoint.  He underwent SBRT to the left upper lobe lung lesion.   Labs from September 2021 showed a normal liver and kidney function.  TSH was normal.  Vitamin D  levels were low.  CBC showed white count of 12.6, H&H of 10.9/37 with an MCV of 102 and a platelet count of 358.   Anemia work-up from 10/27/2020 showed a mildly low ferritin level of 28 with an iron  saturation of 8% myeloma panel, serum free light chains, haptoglobin, TSH and LDH was normal.  B12 levels were low at 166.    Interval history- he reports ongoing fatigue. He is on 5L home O2 at baseline  ECOG PS- 3 Pain scale- 3  Review of systems- Review of Systems  Constitutional:  Positive for malaise/fatigue. Negative for chills, fever and weight  loss.  HENT:  Negative for congestion, ear discharge and nosebleeds.   Eyes:  Negative for blurred vision.  Respiratory:  Positive for shortness of breath. Negative for cough, hemoptysis, sputum production and wheezing.   Cardiovascular:  Negative for chest pain, palpitations, orthopnea and claudication.  Gastrointestinal:  Negative for abdominal pain, blood in stool, constipation, diarrhea, heartburn, melena, nausea and vomiting.  Genitourinary:  Negative for dysuria, flank pain, frequency, hematuria and urgency.  Musculoskeletal:  Negative for back pain, joint pain and myalgias.  Skin:  Negative for rash.  Neurological:  Negative for dizziness, tingling, focal weakness, seizures, weakness and headaches.  Endo/Heme/Allergies:  Does not bruise/bleed easily.  Psychiatric/Behavioral:  Negative for depression and suicidal ideas. The patient does not have insomnia.       Allergies  Allergen Reactions   Bee Venom Anaphylaxis   Linezolid  Other (See Comments)    Thrombocytopenia, hyperlactatemia   Azithromycin  Itching and Rash     Past Medical History:  Diagnosis Date   Anemia    Anxiety    Arthritis    Asthma    Cancer (HCC)    Basal Cell Skin Cancer   Chronic back pain    COPD (chronic obstructive pulmonary disease) (HCC)    Depression    Diabetes mellitus (HCC)    Dyspnea    GERD (gastroesophageal reflux disease)    Gout    Gout    Headache    History of blood clots    Left Leg--July 2018   History of kidney  stones    Hyperlipidemia    Hyperlipidemia    Hypertension    Kidney stones    Neuropathy    On home oxygen  therapy    2 L / M   Pneumonia 06/2017   Sleep apnea    Ulcer of foot (HCC)    Right     Past Surgical History:  Procedure Laterality Date   APPENDECTOMY     DG FEET 2 VIEWS BILAT     IRRIGATION AND DEBRIDEMENT FOOT Left 07/11/2023   Procedure: IRRIGATION AND DEBRIDEMENT FOOT;  Surgeon: Anell Baptist, DPM;  Location: [facility] ORS;  Service:  Orthopedics/Podiatry;  Laterality: Left;   LIPOMA EXCISION Right 08/15/2017   Procedure: EXCISION TUMOR(CYST) FOOT;  Surgeon: Sharlyn Deaner, DPM;  Location: [facility] ORS;  Service: Podiatry;  Laterality: Right;   LOWER EXTREMITY ANGIOGRAPHY Left 07/03/2023   Procedure: Lower Extremity Angiography;  Surgeon: Celso College, MD;  Location: [facility];  Service: Cardiovascular;  Laterality: Left;   OTHER SURGICAL HISTORY Bilateral Foot surgery    Social History   Socioeconomic History   Marital status: Widowed    Spouse name: Not on file   Number of children: Not on file   Years of education: Not on file   Highest education level: Not on file  Occupational History   Not on file  Tobacco Use   Smoking status: Former    Current packs/day: 0.50    Average packs/day: 0.5 packs/day for 7.3 years (3.7 ttl pk-yrs)    Types: Cigarettes    Start date: 2018   Smokeless tobacco: Never  Vaping Use   Vaping status: Never Used  Substance and Sexual Activity   Alcohol use: Yes    Alcohol/week: 1.0 standard drink of alcohol    Types: 1 Cans of beer per week    Comment: occ   Drug use: No   Sexual activity: Not Currently  Other Topics Concern   Not on file  Social History Narrative   Not on file   Social Drivers of Health   Financial Resource Strain: Low Risk  (02/05/2024)   Received from [facility]   Overall Financial Resource Strain (CARDIA)    Difficulty of Paying Living Expenses: Not hard at all  Food Insecurity: No Food Insecurity (02/20/2024)   Hunger Vital Sign    Worried About Running Out of Food in the Last Year: Never true    Ran Out of Food in the Last Year: Never true  Transportation Needs: No Transportation Needs (02/20/2024)   PRAPARE - Administrator, Civil Service (Medical): No    Lack of Transportation (Non-Medical): No  Physical Activity: Not on file  Stress: Not on file  Social Connections: Socially Isolated (02/20/2024)   Social  Connection and Isolation Panel [NHANES]    Frequency of Communication with Friends and Family: More than three times a week    Frequency of Social Gatherings with Friends and Family: More than three times a week    Attends Religious Services: Never    Database administrator or Organizations: No    Attends Banker Meetings: Never    Marital Status: Widowed  Intimate Partner Violence: Not At Risk (02/20/2024)   Humiliation, Afraid, Rape, and Kick questionnaire    Fear of Current or Ex-Partner: No    Emotionally Abused: No    Physically Abused: No    Sexually Abused: No    Family History  Problem Relation Age of  Onset   Hypertension Mother      Current Outpatient Medications:    acetaminophen  (TYLENOL ) 325 MG tablet, Take 650 mg by mouth every 6 (six) hours as needed., Disp: , Rfl:    albuterol  (VENTOLIN  HFA) 108 (90 Base) MCG/ACT inhaler, Inhale 2 puffs into the lungs every 4 (four) hours as needed for wheezing or shortness of breath., Disp: , Rfl:    allopurinol  (ZYLOPRIM ) 300 MG tablet, Take 600 mg by mouth daily., Disp: , Rfl:    Amino Acids-Protein Hydrolys (PRO-STAT SUGAR FREE PO), Take 30 mLs by mouth 2 (two) times daily., Disp: , Rfl:    aspirin  EC 81 MG tablet, Take 1 tablet by mouth daily., Disp: , Rfl:    azelastine  (ASTELIN ) 0.1 % nasal spray, Place 2 sprays into both nostrils 2 (two) times daily as needed., Disp: , Rfl:    bisacodyl  (DULCOLAX) 5 MG EC tablet, Take 1 tablet (5 mg total) by mouth daily as needed for moderate constipation., Disp: 25 tablet, Rfl: 0   bismuth  subsalicylate (STOMACH RELIEF) 262 MG/15ML suspension, Take 15 mLs by mouth every 6 (six) hours as needed., Disp: , Rfl:    Buprenorphine  HCl-Naloxone  HCl 8-2 MG FILM, Place 1 Film under the tongue in the morning, at noon, and at bedtime., Disp: , Rfl:    clopidogrel  (PLAVIX ) 75 MG tablet, Take 1 tablet (75 mg total) by mouth daily., Disp: 30 tablet, Rfl: 1   cyanocobalamin  (VITAMIN B12) 1000  MCG/ML injection, Inject 1 mL (1,000 mcg total) into the muscle every 30 (thirty) days., Disp: 1 mL, Rfl: 11   Dextromethorphan-guaiFENesin  10-100 MG/5ML liquid, Take 5 mLs by mouth every 4 (four) hours as needed (cough)., Disp: , Rfl:    DULoxetine  (CYMBALTA ) 30 MG capsule, Take 30 mg by mouth daily., Disp: , Rfl:    ezetimibe  (ZETIA ) 10 MG tablet, Take 10 mg by mouth daily., Disp: , Rfl:    fluticasone  (FLONASE ) 50 MCG/ACT nasal spray, Place 1 spray into both nostrils daily as needed for allergies., Disp: , Rfl:    Fluticasone -Umeclidin-Vilant (TRELEGY ELLIPTA) 100-62.5-25 MCG/ACT AEPB, Inhale 1 puff into the lungs in the morning., Disp: , Rfl:    furosemide  (LASIX ) 20 MG tablet, Take 1 tablet (20 mg total) by mouth daily., Disp: 30 tablet, Rfl: 0   gabapentin  (NEURONTIN ) 800 MG tablet, Take 800 mg by mouth 4 (four) times daily., Disp: , Rfl:    ipratropium-albuterol  (DUONEB) 0.5-2.5 (3) MG/3ML SOLN, Take 3 mLs by nebulization every 6 (six) hours as needed (wheezing)., Disp: , Rfl:    metFORMIN  (GLUCOPHAGE ) 1000 MG tablet, Take 1,000 mg by mouth daily., Disp: , Rfl:    metoprolol  succinate (TOPROL -XL) 50 MG 24 hr tablet, Take 50 mg by mouth daily., Disp: , Rfl:    Multiple Vitamins-Minerals (MULTIVITAMIN ADULT, MINERALS, PO), Take 1 tablet by mouth every morning., Disp: , Rfl:    naloxone  (NARCAN ) nasal spray 4 mg/0.1 mL, Place 1 spray into the nose once., Disp: , Rfl:    ondansetron  (ZOFRAN -ODT) 4 MG disintegrating tablet, Take 4 mg by mouth every 8 (eight) hours as needed for nausea., Disp: , Rfl:    OXYGEN , Inhale 3 L into the lungs., Disp: , Rfl:    pantoprazole  (PROTONIX ) 40 MG tablet, Take 40 mg by mouth daily as needed., Disp: , Rfl:    roflumilast  (DALIRESP ) 500 MCG TABS tablet, Take 500 mcg by mouth daily., Disp: , Rfl:    simvastatin  (ZOCOR ) 40 MG tablet, Take 40 mg by  mouth at bedtime., Disp: , Rfl:    Syringe/Needle, Disp, (SYRINGE 3CC/25GX1") 25G X 1" 3 ML MISC, 1 Syringe by Does not  apply route every 30 (thirty) days., Disp: 12 each, Rfl: 0   traZODone  (DESYREL ) 50 MG tablet, Take 50 mg by mouth at bedtime as needed for sleep., Disp: , Rfl:   Physical exam: There were no vitals filed for this visit. Physical Exam Cardiovascular:     Rate and Rhythm: Normal rate and regular rhythm.     Heart sounds: Normal heart sounds.  Pulmonary:     Effort: Pulmonary effort is normal.     Comments: Breath sounds decreased b/l over bases Skin:    General: Skin is warm and dry.  Neurological:     Mental Status: He is alert and oriented to person, place, and time.      I have personally reviewed labs listed below:    Latest Ref Rng & Units 02/23/2024    5:17 AM  CMP  Glucose 70 - 99 mg/dL 161   BUN 8 - 23 mg/dL 23   Creatinine 0.96 - 1.24 mg/dL 0.45   Sodium 409 - 811 mmol/L 138   Potassium 3.5 - 5.1 mmol/L 4.1   Chloride 98 - 111 mmol/L 99   CO2 22 - 32 mmol/L 29   Calcium  8.9 - 10.3 mg/dL 9.3       Latest Ref Rng & Units 02/23/2024    5:17 AM  CBC  WBC 4.0 - 10.5 K/uL 8.3   Hemoglobin 13.0 - 17.0 g/dL 9.9   Hematocrit 91.4 - 52.0 % 33.1   Platelets 150 - 400 K/uL 251       Assessment and plan- Patient is a 68 y.o. male here for follow up of following issues:  History of lungCancer s/p SBRT in 2021 patient had CT angio chest in March 2025 for rule out of pulmonary embolism.  He was not found to have any acute PE.  Left upper lobe pleural-based spiculated mass was stable compatible with treated malignancy.  He was found to have some groundglass opacities in the right and left lung apices consistent with infectious or inflammatory process.  No evidence of progressive disease. 2.  History of iron  deficiency anemia: He has received 2 doses of IV iron  Feraheme 2 to 3 weeks ago.  We are not checking iron  studies today.  CBC ferritin and iron  studies in 3 and 6 months and I will see him back in 6 months with scans prior   Visit Diagnosis 1. Encounter for follow-up  surveillance of lung cancer   2. Other iron  deficiency anemia      Dr. Seretha Dance, MD, MPH [facility] [phone] 04/02/2024 3:27 PM

## 2024-04-03 ENCOUNTER — Encounter: Payer: Self-pay | Admitting: Oncology

## 2024-04-27 ENCOUNTER — Encounter (INDEPENDENT_AMBULATORY_CARE_PROVIDER_SITE_OTHER): Payer: Self-pay

## 2024-06-25 ENCOUNTER — Encounter: Payer: Self-pay | Admitting: Oncology

## 2024-07-02 ENCOUNTER — Inpatient Hospital Stay: Attending: Oncology

## 2024-07-02 ENCOUNTER — Other Ambulatory Visit

## 2024-07-12 ENCOUNTER — Inpatient Hospital Stay: Attending: Oncology

## 2024-07-12 DIAGNOSIS — D509 Iron deficiency anemia, unspecified: Secondary | ICD-10-CM | POA: Insufficient documentation

## 2024-07-12 DIAGNOSIS — Z85118 Personal history of other malignant neoplasm of bronchus and lung: Secondary | ICD-10-CM | POA: Diagnosis present

## 2024-07-12 DIAGNOSIS — D508 Other iron deficiency anemias: Secondary | ICD-10-CM

## 2024-07-12 LAB — IRON AND TIBC
Iron: 27 ug/dL — ABNORMAL LOW (ref 45–182)
Saturation Ratios: 8 % — ABNORMAL LOW (ref 17.9–39.5)
TIBC: 330 ug/dL (ref 250–450)
UIBC: 303 ug/dL

## 2024-07-12 LAB — CBC (CANCER CENTER ONLY)
HCT: 42.2 % (ref 39.0–52.0)
Hemoglobin: 13.5 g/dL (ref 13.0–17.0)
MCH: 29 pg (ref 26.0–34.0)
MCHC: 32 g/dL (ref 30.0–36.0)
MCV: 90.8 fL (ref 80.0–100.0)
Platelet Count: 288 K/uL (ref 150–400)
RBC: 4.65 MIL/uL (ref 4.22–5.81)
RDW: 15.1 % (ref 11.5–15.5)
WBC Count: 13.4 K/uL — ABNORMAL HIGH (ref 4.0–10.5)
nRBC: 0 % (ref 0.0–0.2)

## 2024-07-12 LAB — FERRITIN: Ferritin: 90 ng/mL (ref 24–336)

## 2024-07-15 ENCOUNTER — Emergency Department

## 2024-07-15 ENCOUNTER — Inpatient Hospital Stay
Admission: EM | Admit: 2024-07-15 | Discharge: 2024-07-17 | DRG: 871 | Disposition: A | Discharge status: Home / Self Care | Attending: Internal Medicine | Admitting: Internal Medicine

## 2024-07-15 ENCOUNTER — Inpatient Hospital Stay

## 2024-07-15 DIAGNOSIS — G459 Transient cerebral ischemic attack, unspecified: Secondary | ICD-10-CM | POA: Diagnosis present

## 2024-07-15 DIAGNOSIS — G894 Chronic pain syndrome: Secondary | ICD-10-CM | POA: Diagnosis present

## 2024-07-15 DIAGNOSIS — Z7984 Long term (current) use of oral hypoglycemic drugs: Secondary | ICD-10-CM

## 2024-07-15 DIAGNOSIS — J440 Chronic obstructive pulmonary disease with acute lower respiratory infection: Secondary | ICD-10-CM | POA: Diagnosis present

## 2024-07-15 DIAGNOSIS — Z85828 Personal history of other malignant neoplasm of skin: Secondary | ICD-10-CM

## 2024-07-15 DIAGNOSIS — J189 Pneumonia, unspecified organism: Secondary | ICD-10-CM | POA: Diagnosis present

## 2024-07-15 DIAGNOSIS — E1129 Type 2 diabetes mellitus with other diabetic kidney complication: Secondary | ICD-10-CM | POA: Diagnosis present

## 2024-07-15 DIAGNOSIS — R6520 Severe sepsis without septic shock: Secondary | ICD-10-CM | POA: Diagnosis present

## 2024-07-15 DIAGNOSIS — E1142 Type 2 diabetes mellitus with diabetic polyneuropathy: Secondary | ICD-10-CM | POA: Diagnosis present

## 2024-07-15 DIAGNOSIS — J9601 Acute respiratory failure with hypoxia: Secondary | ICD-10-CM | POA: Diagnosis not present

## 2024-07-15 DIAGNOSIS — A419 Sepsis, unspecified organism: Principal | ICD-10-CM | POA: Diagnosis present

## 2024-07-15 DIAGNOSIS — Z86718 Personal history of other venous thrombosis and embolism: Secondary | ICD-10-CM | POA: Diagnosis not present

## 2024-07-15 DIAGNOSIS — Z91030 Bee allergy status: Secondary | ICD-10-CM

## 2024-07-15 DIAGNOSIS — J449 Chronic obstructive pulmonary disease, unspecified: Secondary | ICD-10-CM | POA: Diagnosis not present

## 2024-07-15 DIAGNOSIS — Z7951 Long term (current) use of inhaled steroids: Secondary | ICD-10-CM

## 2024-07-15 DIAGNOSIS — N1831 Chronic kidney disease, stage 3a: Secondary | ICD-10-CM | POA: Diagnosis present

## 2024-07-15 DIAGNOSIS — Z888 Allergy status to other drugs, medicaments and biological substances status: Secondary | ICD-10-CM

## 2024-07-15 DIAGNOSIS — E119 Type 2 diabetes mellitus without complications: Secondary | ICD-10-CM

## 2024-07-15 DIAGNOSIS — Z8249 Family history of ischemic heart disease and other diseases of the circulatory system: Secondary | ICD-10-CM | POA: Diagnosis not present

## 2024-07-15 DIAGNOSIS — J9611 Chronic respiratory failure with hypoxia: Secondary | ICD-10-CM | POA: Diagnosis present

## 2024-07-15 DIAGNOSIS — I129 Hypertensive chronic kidney disease with stage 1 through stage 4 chronic kidney disease, or unspecified chronic kidney disease: Secondary | ICD-10-CM | POA: Diagnosis present

## 2024-07-15 DIAGNOSIS — J9621 Acute and chronic respiratory failure with hypoxia: Secondary | ICD-10-CM | POA: Diagnosis present

## 2024-07-15 DIAGNOSIS — K219 Gastro-esophageal reflux disease without esophagitis: Secondary | ICD-10-CM | POA: Diagnosis present

## 2024-07-15 DIAGNOSIS — F419 Anxiety disorder, unspecified: Secondary | ICD-10-CM | POA: Diagnosis present

## 2024-07-15 DIAGNOSIS — C3492 Malignant neoplasm of unspecified part of left bronchus or lung: Secondary | ICD-10-CM | POA: Diagnosis present

## 2024-07-15 DIAGNOSIS — E785 Hyperlipidemia, unspecified: Secondary | ICD-10-CM | POA: Diagnosis present

## 2024-07-15 DIAGNOSIS — E1122 Type 2 diabetes mellitus with diabetic chronic kidney disease: Secondary | ICD-10-CM | POA: Diagnosis present

## 2024-07-15 DIAGNOSIS — F17200 Nicotine dependence, unspecified, uncomplicated: Secondary | ICD-10-CM | POA: Diagnosis present

## 2024-07-15 DIAGNOSIS — Z9981 Dependence on supplemental oxygen: Secondary | ICD-10-CM

## 2024-07-15 DIAGNOSIS — Z79899 Other long term (current) drug therapy: Secondary | ICD-10-CM | POA: Diagnosis not present

## 2024-07-15 DIAGNOSIS — Z881 Allergy status to other antibiotic agents status: Secondary | ICD-10-CM

## 2024-07-15 DIAGNOSIS — F418 Other specified anxiety disorders: Secondary | ICD-10-CM | POA: Diagnosis present

## 2024-07-15 DIAGNOSIS — Z7902 Long term (current) use of antithrombotics/antiplatelets: Secondary | ICD-10-CM

## 2024-07-15 DIAGNOSIS — Z923 Personal history of irradiation: Secondary | ICD-10-CM | POA: Diagnosis not present

## 2024-07-15 DIAGNOSIS — R54 Age-related physical debility: Secondary | ICD-10-CM | POA: Diagnosis present

## 2024-07-15 DIAGNOSIS — Z7982 Long term (current) use of aspirin: Secondary | ICD-10-CM | POA: Diagnosis not present

## 2024-07-15 DIAGNOSIS — I10 Essential (primary) hypertension: Secondary | ICD-10-CM | POA: Diagnosis present

## 2024-07-15 LAB — CBC
HCT: 41.9 % (ref 39.0–52.0)
Hemoglobin: 12.8 g/dL — ABNORMAL LOW (ref 13.0–17.0)
MCH: 28.8 pg (ref 26.0–34.0)
MCHC: 30.5 g/dL (ref 30.0–36.0)
MCV: 94.2 fL (ref 80.0–100.0)
Platelets: 258 K/uL (ref 150–400)
RBC: 4.45 MIL/uL (ref 4.22–5.81)
RDW: 15.3 % (ref 11.5–15.5)
WBC: 27.2 K/uL — ABNORMAL HIGH (ref 4.0–10.5)
nRBC: 0 % (ref 0.0–0.2)

## 2024-07-15 LAB — BASIC METABOLIC PANEL WITH GFR
Anion gap: 12 (ref 5–15)
BUN: 22 mg/dL (ref 8–23)
CO2: 27 mmol/L (ref 22–32)
Calcium: 8.9 mg/dL (ref 8.9–10.3)
Chloride: 98 mmol/L (ref 98–111)
Creatinine, Ser: 1.3 mg/dL — ABNORMAL HIGH (ref 0.61–1.24)
GFR, Estimated: 59 mL/min — ABNORMAL LOW (ref >60)
Glucose, Bld: 174 mg/dL — ABNORMAL HIGH (ref 70–99)
Potassium: 4.3 mmol/L (ref 3.5–5.1)
Sodium: 137 mmol/L (ref 135–145)

## 2024-07-15 LAB — PROTIME-INR
INR: 1.2 (ref 0.8–1.2)
Prothrombin Time: 15.7 s — ABNORMAL HIGH (ref 11.4–15.2)

## 2024-07-15 LAB — LACTIC ACID, PLASMA
Lactic Acid, Venous: 2.2 mmol/L (ref 0.5–1.9)
Lactic Acid, Venous: 4.2 mmol/L (ref 0.5–1.9)
Lactic Acid, Venous: 2.8 mmol/L (ref 0.5–1.9)

## 2024-07-15 LAB — GLUCOSE, CAPILLARY
Glucose-Capillary: 152 mg/dL — ABNORMAL HIGH (ref 70–99)
Glucose-Capillary: 189 mg/dL — ABNORMAL HIGH (ref 70–99)

## 2024-07-15 MED ORDER — SENNOSIDES-DOCUSATE SODIUM 8.6-50 MG PO TABS
1.0000 | ORAL_TABLET | Freq: Two times a day (BID) | ORAL | Status: DC
  Administered 2024-07-15 – 2024-07-17 (×3): 1 via ORAL
  Filled 2024-07-15 (×4): qty 1

## 2024-07-15 MED ORDER — ACETAMINOPHEN 325 MG PO TABS
650.0000 mg | ORAL_TABLET | Freq: Four times a day (QID) | ORAL | Status: DC | PRN
  Administered 2024-07-16: 650 mg via ORAL
  Filled 2024-07-15: qty 2

## 2024-07-15 MED ORDER — LEVOFLOXACIN IN D5W 750 MG/150ML IV SOLN
750.0000 mg | Freq: Once | INTRAVENOUS | Status: AC
  Administered 2024-07-15: 750 mg via INTRAVENOUS
  Filled 2024-07-15: qty 150

## 2024-07-15 MED ORDER — ACETAMINOPHEN 650 MG RE SUPP: 650.0000 mg | Freq: Four times a day (QID) | RECTAL | Status: DC | PRN

## 2024-07-15 MED ORDER — INSULIN ASPART 100 UNIT/ML IJ SOLN: 0.0000 [IU] | Freq: Every day | INTRAMUSCULAR | Status: DC

## 2024-07-15 MED ORDER — SODIUM CHLORIDE 0.9 % IV SOLN
2.0000 g | INTRAVENOUS | Status: AC
  Administered 2024-07-16 – 2024-07-17 (×2): 2 g via INTRAVENOUS
  Filled 2024-07-15 (×2): qty 20

## 2024-07-15 MED ORDER — SODIUM CHLORIDE 0.9 % IV BOLUS (SEPSIS)
1000.0000 mL | Freq: Once | INTRAVENOUS | Status: AC
  Administered 2024-07-15: 1000 mL via INTRAVENOUS

## 2024-07-15 MED ORDER — ONDANSETRON HCL 4 MG PO TABS: 4.0000 mg | ORAL_TABLET | Freq: Four times a day (QID) | ORAL | Status: DC | PRN

## 2024-07-15 MED ORDER — SIMVASTATIN 20 MG PO TABS
40.0000 mg | ORAL_TABLET | Freq: Every day | ORAL | Status: DC
  Administered 2024-07-15 – 2024-07-16 (×2): 40 mg via ORAL
  Filled 2024-07-15 (×2): qty 2

## 2024-07-15 MED ORDER — SODIUM CHLORIDE 0.9 % IV BOLUS
1000.0000 mL | Freq: Once | INTRAVENOUS | Status: AC
  Administered 2024-07-15: 1000 mL via INTRAVENOUS

## 2024-07-15 MED ORDER — INSULIN ASPART 100 UNIT/ML IJ SOLN
0.0000 [IU] | Freq: Three times a day (TID) | INTRAMUSCULAR | Status: DC
  Administered 2024-07-15: 1 [IU] via SUBCUTANEOUS
  Administered 2024-07-16 – 2024-07-17 (×3): 2 [IU] via SUBCUTANEOUS
  Filled 2024-07-15 (×5): qty 1

## 2024-07-15 MED ORDER — ONDANSETRON HCL 4 MG/2ML IJ SOLN: 4.0000 mg | Freq: Four times a day (QID) | INTRAMUSCULAR | Status: DC | PRN

## 2024-07-15 MED ORDER — SODIUM CHLORIDE 0.9 % IV SOLN
100.0000 mg | Freq: Two times a day (BID) | INTRAVENOUS | Status: DC
  Administered 2024-07-16: 100 mg via INTRAVENOUS
  Filled 2024-07-15: qty 100

## 2024-07-15 MED ORDER — SENNOSIDES-DOCUSATE SODIUM 8.6-50 MG PO TABS: 1.0000 | ORAL_TABLET | Freq: Every evening | ORAL | Status: DC | PRN

## 2024-07-15 MED ORDER — ONDANSETRON HCL 4 MG/2ML IJ SOLN
4.0000 mg | Freq: Once | INTRAMUSCULAR | Status: AC
  Administered 2024-07-15: 4 mg via INTRAVENOUS
  Filled 2024-07-15: qty 2

## 2024-07-15 MED ORDER — ALBUTEROL SULFATE (2.5 MG/3ML) 0.083% IN NEBU
5.0000 mg | INHALATION_SOLUTION | Freq: Once | RESPIRATORY_TRACT | Status: AC
  Administered 2024-07-15: 5 mg via RESPIRATORY_TRACT
  Filled 2024-07-15: qty 6

## 2024-07-15 MED ORDER — IPRATROPIUM-ALBUTEROL 0.5-2.5 (3) MG/3ML IN SOLN
3.0000 mL | Freq: Four times a day (QID) | RESPIRATORY_TRACT | Status: DC | PRN
  Administered 2024-07-15: 3 mL via RESPIRATORY_TRACT
  Filled 2024-07-15: qty 3

## 2024-07-15 MED ORDER — LACTATED RINGERS IV BOLUS
250.0000 mL | Freq: Once | INTRAVENOUS | Status: AC
  Administered 2024-07-15: 250 mL via INTRAVENOUS

## 2024-07-15 MED ORDER — METHYLPREDNISOLONE SODIUM SUCC 125 MG IJ SOLR
125.0000 mg | INTRAMUSCULAR | Status: AC
  Administered 2024-07-15: 125 mg via INTRAVENOUS
  Filled 2024-07-15: qty 2

## 2024-07-15 MED ORDER — HEPARIN SODIUM (PORCINE) 5000 UNIT/ML IJ SOLN
5000.0000 [IU] | Freq: Three times a day (TID) | INTRAMUSCULAR | Status: DC
  Administered 2024-07-15 – 2024-07-17 (×5): 5000 [IU] via SUBCUTANEOUS
  Filled 2024-07-15 (×5): qty 1

## 2024-07-15 MED ORDER — SODIUM CHLORIDE 0.9 % IV SOLN: INTRAVENOUS | Status: DC

## 2024-07-15 MED ORDER — BUPRENORPHINE HCL-NALOXONE HCL 8-2 MG SL SUBL
1.0000 | SUBLINGUAL_TABLET | Freq: Once | SUBLINGUAL | Status: AC
  Administered 2024-07-15: 1 via SUBLINGUAL
  Filled 2024-07-15: qty 1

## 2024-07-15 MED ORDER — IOHEXOL 350 MG/ML SOLN
75.0000 mL | Freq: Once | INTRAVENOUS | Status: AC | PRN
  Administered 2024-07-15: 75 mL via INTRAVENOUS

## 2024-07-15 MED ORDER — IPRATROPIUM-ALBUTEROL 0.5-2.5 (3) MG/3ML IN SOLN
3.0000 mL | Freq: Once | RESPIRATORY_TRACT | Status: AC
  Administered 2024-07-15: 3 mL via RESPIRATORY_TRACT
  Filled 2024-07-15: qty 3

## 2024-07-15 MED ORDER — GABAPENTIN 400 MG PO CAPS
800.0000 mg | ORAL_CAPSULE | Freq: Once | ORAL | Status: AC
  Administered 2024-07-15: 800 mg via ORAL
  Filled 2024-07-15: qty 2

## 2024-07-15 MED ORDER — NICOTINE 21 MG/24HR TD PT24: 21.0000 mg | MEDICATED_PATCH | Freq: Every day | TRANSDERMAL | Status: DC | PRN

## 2024-07-15 MED ORDER — CLOPIDOGREL BISULFATE 75 MG PO TABS
75.0000 mg | ORAL_TABLET | Freq: Once | ORAL | Status: AC
  Administered 2024-07-15: 75 mg via ORAL
  Filled 2024-07-15: qty 1

## 2024-07-15 MED ORDER — HYDRALAZINE HCL 20 MG/ML IJ SOLN: 5.0000 mg | Freq: Four times a day (QID) | INTRAMUSCULAR | Status: DC | PRN

## 2024-07-15 MED ORDER — DIPHENHYDRAMINE HCL 50 MG/ML IJ SOLN
25.0000 mg | Freq: Once | INTRAMUSCULAR | Status: AC
  Administered 2024-07-15: 25 mg via INTRAVENOUS
  Filled 2024-07-15: qty 1

## 2024-07-15 NOTE — Assessment & Plan Note (Signed)
 At baseline

## 2024-07-15 NOTE — Assessment & Plan Note (Signed)
Hydralazine 5 mg IV every 6 hours as needed for SBP greater 170, 5 days ordered

## 2024-07-15 NOTE — Assessment & Plan Note (Signed)
-   Simvastatin 40 mg nightly resume

## 2024-07-15 NOTE — Assessment & Plan Note (Signed)
 Right lower lobe Status post levofloxacin  750 mg IV one-time dose.  EDP Continue with doxycycline  100 mg IV twice daily, ceftriaxone  2 g IV daily to complete a 5-day course Incentive spirometry, flutter valve

## 2024-07-15 NOTE — Assessment & Plan Note (Signed)
 Secondary to community-acquired pneumonia Continue treatment with IV antibiotics: Doxycycline   100 mg IV twice daily and ceftriaxone  2 g IV daily to complete a 5-day course Continuous pulse oximetry Maintain SpO2 greater than 92% Incentive spirometry, flutter valve Admit to telemetry medical, inpatient

## 2024-07-15 NOTE — Assessment & Plan Note (Signed)
 Home Farxiga , metformin  will not be resumed on admission Insulin  SSI with at bedtime coverage ordered

## 2024-07-15 NOTE — Assessment & Plan Note (Signed)
 Continue outpatient follow-up with medical oncology as appropriate

## 2024-07-15 NOTE — ED Notes (Signed)
 Pt O2 increased to 6L per minute.

## 2024-07-15 NOTE — Assessment & Plan Note (Signed)
Home simvastatin 40 mg nightly resumed

## 2024-07-15 NOTE — ED Provider Notes (Signed)
 [facility] Provider Note    Event Date/Time   First MD Initiated Contact with Patient 07/15/24 1356     (approximate)   History   Chief Complaint: Shortness of Breath   HPI  Christian Fields is a 68 y.o. male with a history of COPD on 4 L nasal cannula, diabetes, hypertension who comes ED complaining of shortness of breath and productive cough since yesterday.  Had to increase his usual 4 L nasal cannula to 8 L.  Also complains of worsening shortness of breath with walking and pleuritic chest pain in the right lower chest.  No recent hospitalization or antibiotic use.        Past Medical History:  Diagnosis Date   Anemia    Anxiety    Arthritis    Asthma    Cancer (HCC)    Basal Cell Skin Cancer   Chronic back pain    COPD (chronic obstructive pulmonary disease) (HCC)    Depression    Diabetes mellitus (HCC)    Dyspnea    GERD (gastroesophageal reflux disease)    Gout    Gout    Headache    History of blood clots    Left Leg--July 2018   History of kidney stones    Hyperlipidemia    Hyperlipidemia    Hypertension    Kidney stones    Neuropathy    On home oxygen  therapy    2 L / M   Pneumonia 06/2017   Sleep apnea    Ulcer of foot [facility])    Right    Current Outpatient Rx   Order #: [id number] Class: Historical Med   Order #: [id number] Class: Historical Med   Order #: [id number] Class: Historical Med   Order #: [id number] Class: Historical Med   Order #: [id number] Class: Historical Med   Order #: [id number] Class: Historical Med   Order #: [id number] Class: Normal   Order #: [id number] Class: Historical Med   Order #: [id number] Class: Historical Med   Order #: [id number] Class: Normal   Order #: [id number] Class: Normal   Order #: [id number] Class: Historical Med   Order #: [id number] Class: Historical Med   Order #: [id number] Class: Historical Med   Order #: [id number] Class: Historical Med   Order #: [id number] Class: Historical Med   Order #:  [id number] Class: Historical Med   Order #: [id number] Class: Normal   Order #: [id number] Class: Historical Med   Order #: [id number] Class: Historical Med   Order #: [id number] Class: Historical Med   Order #: [id number] Class: Historical Med   Order #: [id number] Class: Historical Med   Order #: [id number] Class: Historical Med   Order #: [id number] Class: Historical Med   Order #: [id number] Class: Historical Med   Order #: [id number] Class: Historical Med   Order #: [id number] Class: Historical Med   Order #: [id number] Class: Historical Med   Order #: [id number] Class: Normal   Order #: [id number] Class: Historical Med    Past Surgical History:  Procedure Laterality Date   APPENDECTOMY     DG FEET 2 VIEWS BILAT     IRRIGATION AND DEBRIDEMENT FOOT Left 07/11/2023   Procedure: IRRIGATION AND DEBRIDEMENT FOOT;  Surgeon: Ashley Soulier, DPM;  Location: [facility] ORS;  Service: Orthopedics/Podiatry;  Laterality: Left;   LIPOMA EXCISION Right 08/15/2017   Procedure: EXCISION TUMOR(CYST) FOOT;  Surgeon: Lilli Cough, DPM;  Location: [facility] ORS;  Service: Podiatry;  Laterality: Right;   LOWER EXTREMITY ANGIOGRAPHY Left 07/03/2023   Procedure: Lower Extremity Angiography;  Surgeon: Marea Selinda RAMAN, MD;  Location: [facility] INVASIVE [facility];  Service:  Cardiovascular;  Laterality: Left;   OTHER SURGICAL HISTORY Bilateral Foot surgery    Physical Exam   Triage Vital Signs: ED Triage Vitals  Encounter Vitals Group     BP 07/15/24 1324 (!) 147/85     Girls Systolic BP Percentile --      Girls Diastolic BP Percentile --      Boys Systolic BP Percentile --      Boys Diastolic BP Percentile --      Pulse Rate 07/15/24 1324 (!) 128     Resp 07/15/24 1324 (!) 26     Temp 07/15/24 1324 98.3 F (36.8 C)     Temp Source 07/15/24 1324 Oral     SpO2 07/15/24 1324 90 %     Weight 07/15/24 1325 170 lb (77.1 kg)     Height 07/15/24 1325 5' 7 (1.702 m)     Head Circumference --      Peak Flow --      Pain Score 07/15/24 1325 0      Pain Loc --      Pain Education --      Exclude from Growth Chart --     Most recent vital signs: Vitals:   07/15/24 1324 07/15/24 1430  BP: (!) 147/85 113/70  Pulse: (!) 128 (!) 112  Resp: (!) 26 (!) 28  Temp: 98.3 F (36.8 C)   SpO2: 90% (!) 89%    General: Awake, no distress.  CV:  Good peripheral perfusion.  Tachycardia heart rate 125, symmetric distal pulses Resp:  Tachypnea.  Crackles in the right base. Abd:  No distention.  Soft nontender Other:  No lower extremity edema   ED Results / Procedures / Treatments   Labs (all labs ordered are listed, but only abnormal results are displayed) Labs Reviewed  BASIC METABOLIC PANEL WITH GFR - Abnormal; Notable for the following components:      Result Value   Glucose, Bld 174 (*)    Creatinine, Ser 1.30 (*)    GFR, Estimated 59 (*)    All other components within normal limits  CBC - Abnormal; Notable for the following components:   WBC 27.2 (*)    Hemoglobin 12.8 (*)    All other components within normal limits  PROTIME-INR - Abnormal; Notable for the following components:   Prothrombin Time 15.7 (*)    All other components within normal limits  LACTIC ACID, PLASMA - Abnormal; Notable for the following components:   Lactic Acid, Venous 2.2 (*)    All other components within normal limits  CULTURE, BLOOD (ROUTINE X 2)  CULTURE, BLOOD (ROUTINE X 2)  LACTIC ACID, PLASMA     EKG Interpreted by me Sinus tachycardia rate 127.  Left axis, normal intervals, poor R wave progression.  No acute ischemic changes.   RADIOLOGY Chest x-ray interpreted by me, shows right lower lung pneumonia.  Radiology report reviewed   PROCEDURES:  .Critical Care  Performed by: Viviann Pastor, MD Authorized by: Viviann Pastor, MD   Critical care provider statement:    Critical care time (minutes):  35   Critical care time was exclusive of:  Separately billable procedures and treating other patients   Critical care was  necessary to treat or prevent imminent or life-threatening deterioration of the following conditions:  Sepsis and respiratory failure   Critical care was time spent personally by me on the following activities:  Development of treatment plan with patient or surrogate, discussions with consultants, evaluation of patient's response  to treatment, examination of patient, obtaining history from patient or surrogate, ordering and performing treatments and interventions, ordering and review of laboratory studies, ordering and review of radiographic studies, pulse oximetry, re-evaluation of patient's condition and review of old charts   Care discussed with: admitting provider      MEDICATIONS ORDERED IN ED: Medications  levofloxacin  (LEVAQUIN ) IVPB 750 mg (750 mg Intravenous New Bag/Given 07/15/24 1455)  sodium chloride  0.9 % bolus 1,000 mL (1,000 mLs Intravenous New Bag/Given 07/15/24 1435)  simvastatin  (ZOCOR ) tablet 40 mg (has no administration in time range)  acetaminophen  (TYLENOL ) tablet 650 mg (has no administration in time range)    Or  acetaminophen  (TYLENOL ) suppository 650 mg (has no administration in time range)  ondansetron  (ZOFRAN ) tablet 4 mg (has no administration in time range)    Or  ondansetron  (ZOFRAN ) injection 4 mg (has no administration in time range)  heparin  injection 5,000 Units (has no administration in time range)  senna-docusate (Senokot-S) tablet 1 tablet (has no administration in time range)  doxycycline  (VIBRAMYCIN ) 100 mg in sodium chloride  0.9 % 250 mL IVPB (has no administration in time range)  cefTRIAXone  (ROCEPHIN ) 2 g in sodium chloride  0.9 % 100 mL IVPB (has no administration in time range)  ipratropium-albuterol  (DUONEB) 0.5-2.5 (3) MG/3ML nebulizer solution 3 mL (has no administration in time range)  hydrALAZINE  (APRESOLINE ) injection 5 mg (has no administration in time range)  ondansetron  (ZOFRAN ) injection 4 mg (4 mg Intravenous Given 07/15/24 1448)   methylPREDNISolone  sodium succinate  (SOLU-MEDROL ) 125 mg/2 mL injection 125 mg (125 mg Intravenous Given 07/15/24 1512)  ipratropium-albuterol  (DUONEB) 0.5-2.5 (3) MG/3ML nebulizer solution 3 mL (3 mLs Nebulization Given 07/15/24 1512)  albuterol  (PROVENTIL ) (2.5 MG/3ML) 0.083% nebulizer solution 5 mg (5 mg Nebulization Given 07/15/24 1512)  diphenhydrAMINE  (BENADRYL ) injection 25 mg (25 mg Intravenous Given 07/15/24 1517)     IMPRESSION / MDM / ASSESSMENT AND PLAN / ED COURSE  I reviewed the triage vital signs and the nursing notes.  DDx: COPD exacerbation, pneumonia, pleural effusion, pulmonary edema, pulmonary embolism, sepsis  Patient's presentation is most consistent with acute presentation with potential threat to life or bodily function.  Patient presents with shortness of breath cough, found to be tachycardic tachypneic, leukocytosis of 27,000, elevated lactic acid, all concerning for sepsis.  Antibiotics, cultures started.  Will need to obtain CT chest to rule out PE given his severe tachycardia and pleuritic chest pain.   Clinical Course as of 07/15/24 1518  Thu Jul 15, 2024  1515 Case d/w hospitalist [PS]    Clinical Course User Index [PS] Viviann Pastor, MD     FINAL CLINICAL IMPRESSION(S) / ED DIAGNOSES   Final diagnoses:  Community acquired pneumonia of right lower lobe of lung  Sepsis with acute hypoxic respiratory failure without septic shock, due to unspecified organism [facility])     Rx / DC Orders   ED Discharge Orders     None        Note:  This document was prepared using Dragon voice recognition software and may include unintentional dictation errors.   Viviann Pastor, MD 07/15/24 [phone]

## 2024-07-15 NOTE — Assessment & Plan Note (Signed)
 -  As needed nicotine patch ordered ?

## 2024-07-15 NOTE — Assessment & Plan Note (Addendum)
 Blood cultures x 2 are in process Patient status post sodium chloride  1 L bolus per EDP On admission I have ordered additional sodium chloride  1 L bolus, LR 250 ml, to complete sepsis bolus Sodium chloride  infusion at 150 mL/h, 1 day ordered Patient meets sepsis criteria with increased respiration rate, heart rate, markedly elevated leukocytosis of 27.2, lactic acid of 2.2, organ dysfunction of pulmonary, source is likely right lower lobe pneumonia Will continue to follow lactic acid Strict I's and O's Check MRSA PCR Continue doxycycline  and ceftriaxone 

## 2024-07-15 NOTE — Hospital Course (Signed)
 Mr. Christian Fields is a 68 year old male with history of asthma, hypertension, hyperlipidemia, anxiety, COPD, depression, GERD, who presents ED for chief concerns of shortness of breath since yesterday.  Vitals in the ED showed t 98.3, rr 26, hr 128, bp147/83, SpO2 of 90% on 5 L nasal cannula.  Serum sodium is 137, potassium 4.3, chloride 98, bicarb 27, BUN of 22, serum creatinine 1.30, EGFR 59, nonfasting blood glucose 174, WBC 27.2, hemoglobin 12.8, platelets of 258.  Blood cultures x 2 are in process.  ED treatment: DuoNebs, Solu-Medrol  125 mg IV one-time dose, albuterol  nebulizer one-time dose, ondansetron  4 mg IV one-time dose, levofloxacin  750 mg IV one-time dose, sodium chloride  1 L bolus.

## 2024-07-15 NOTE — Telephone Encounter (Signed)
 Copied from CRM [id number]. Topic: Scheduling - Cancel/Reschedule >> Jul 15, 2024 12:30 PM Ashby T wrote: Cancel  Additional Requests/Needs: Yes Red Word Declined: I offered patient appointment: No, Caller taking patient to ED.  Patient was offered triage based on red word: Breathing difficulty (wheezing or shortness of breath). patient declined triage for the following reason patient doesn't feel necessary, going to ED. Patient is notified of options: can call back to speak with triage nurse, can seek services for medical care.  Message is routed to clinical staff and team lead as high priority per protocol.

## 2024-07-15 NOTE — ED Triage Notes (Signed)
 Pt to ED from home with worsening onset of shortness of breath since yesterday. Pt states he is on 4 L [place] at baseline is currently at 89% on 8 L [place]. Pt is speaking in complete sentences in triage with dyspnea on exertion. Denies recent fever/resp. Illness. A&O x4

## 2024-07-15 NOTE — H&P (Addendum)
 History and Physical   Christian Fields FMW:[id number] DOB: 1956/04/06 DOA: 07/15/2024  PCP: Lora Odor, FNP  Outpatient Specialists: Dr. Melanee, medical oncology Patient coming from: Home  I have personally briefly reviewed patient's old medical records in [facility] EMR.  Chief Concern: Shortness of breath  HPI: Mr. Christian Fields is a 68 year old male with history of asthma, hypertension, hyperlipidemia, anxiety, COPD, depression, GERD, who presents ED for chief concerns of shortness of breath since yesterday.  Vitals in the ED showed t 98.3, rr 26, hr 128, bp147/83, SpO2 of 90% on 5 L nasal cannula.  Serum sodium is 137, potassium 4.3, chloride 98, bicarb 27, BUN of 22, serum creatinine 1.30, EGFR 59, nonfasting blood glucose 174, WBC 27.2, hemoglobin 12.8, platelets of 258.  Blood cultures x 2 are in process.  ED treatment: DuoNebs, Solu-Medrol  125 mg IV one-time dose, albuterol  nebulizer one-time dose, ondansetron  4 mg IV one-time dose, levofloxacin  750 mg IV one-time dose, sodium chloride  1 L bolus. --------------------------------------- At bedside, patient able to tell me his first and last name, age, location, current calendar year.  He reports he developed shortness of breath since yesterday.  He reports no changes to his cough.  He reports he coughs a lot at baseline.  Patient reports that he uses 4 L nasal cannula at baseline and will increase it as needed with exertion.  This is patient's baseline oxygen  settings.  He denies chest pain, dysuria, hematuria, diarrhea, blood in his stool.  He reports he has not had a bowel movement in 2 days.  He reports right upper quadrant abdominal tenderness that is worse with breathing.  He reports this started about 3 days ago.  He denies known sick contacts.  Social history: He lives at home.  He denies tobacco, EtOH, recreational drug use.  He is retired and previously worked as a Surveyor, minerals.  ROS: Constitutional: no weight  change, no fever ENT/Mouth: no sore throat, no rhinorrhea Eyes: no eye pain, no vision changes Cardiovascular: no chest pain, + dyspnea,  no edema, no palpitations Respiratory: no cough, no sputum, no wheezing Gastrointestinal: no nausea, no vomiting, no diarrhea, no constipation Genitourinary: no urinary incontinence, no dysuria, no hematuria Musculoskeletal: no arthralgias, no myalgias Skin: no skin lesions, no pruritus, Neuro: + weakness, no loss of consciousness, no syncope Psych: no anxiety, no depression, + decrease appetite Heme/Lymph: no bruising, no bleeding  ED Course: Discussed with EDP, patient requiring hospitalization for chief concerns of pneumonia.  Assessment/Plan  Principal Problem:   Acute hypoxic respiratory failure (HCC) Active Problems:   Tobacco use disorder   CAP (community acquired pneumonia)   NSCLC of left lung (HCC)   Severe sepsis (HCC)   COPD (chronic obstructive pulmonary disease) (HCC)   Hypertension   Chronic pain syndrome   TIA (transient ischemic attack)   Type 2 diabetes mellitus (HCC)   Type II diabetes mellitus with renal manifestations (HCC)   Chronic respiratory failure with hypoxia (HCC)   HLD (hyperlipidemia)   Depression with anxiety   Anxiety   CKD stage 3a, GFR 45-59 ml/min (HCC)   Assessment and Plan:  * Acute hypoxic respiratory failure (HCC) Secondary to community-acquired pneumonia Continue treatment with IV antibiotics: Doxycycline   100 mg IV twice daily and ceftriaxone  2 g IV daily to complete a 5-day course Continuous pulse oximetry Maintain SpO2 greater than 92% Incentive spirometry, flutter valve Admit to telemetry medical, inpatient  Severe sepsis (HCC) Blood cultures x 2 are in process Patient status post  sodium chloride  1 L bolus per EDP On admission I have ordered additional sodium chloride  1 L bolus, LR 250 ml, to complete sepsis bolus Sodium chloride  infusion at 150 mL/h, 1 day ordered Patient meets sepsis  criteria with increased respiration rate, heart rate, markedly elevated leukocytosis of 27.2, lactic acid of 2.2, organ dysfunction of pulmonary, source is likely right lower lobe pneumonia Will continue to follow lactic acid Strict I's and O's Check MRSA PCR Continue doxycycline  and ceftriaxone   NSCLC of left lung (HCC) Continue outpatient follow-up with medical oncology as appropriate  CAP (community acquired pneumonia) Right lower lobe Status post levofloxacin  750 mg IV one-time dose.  EDP Continue with doxycycline  100 mg IV twice daily, ceftriaxone  2 g IV daily to complete a 5-day course Incentive spirometry, flutter valve  Tobacco use disorder As needed nicotine  patch ordered  Type 2 diabetes mellitus (HCC) Home Farxiga , metformin  will not be resumed on admission Insulin  SSI with at bedtime coverage ordered  TIA (transient ischemic attack) Simvastatin  40 mg nightly resume  Hypertension Hydralazine  5 mg IV every 6 hours as needed for SBP greater 170, 5 days ordered  HLD (hyperlipidemia) Home simvastatin  40 mg nightly resumed  CKD stage 3a, GFR 45-59 ml/min (HCC) At baseline  Chart reviewed.   Complete echo on 02/20/2024: Estimated ejection fraction 40 to 45%, grade 1 diastolic dysfunction  DVT prophylaxis: Heparin  5000 units subcutaneous every 8 hours Code Status: Full code Diet: Heart healthy Family Communication: Updated daughter, Bari at bedside with patient's permission Disposition Plan: Pending clinical course Consults called: None at this time Admission status: Telemetry medical, inpatient  Past Medical History:  Diagnosis Date   Anemia    Anxiety    Arthritis    Asthma    Cancer (HCC)    Basal Cell Skin Cancer   Chronic back pain    COPD (chronic obstructive pulmonary disease) (HCC)    Depression    Diabetes mellitus (HCC)    Dyspnea    GERD (gastroesophageal reflux disease)    Gout    Gout    Headache    History of blood clots    Left  Leg--July 2018   History of kidney stones    Hyperlipidemia    Hyperlipidemia    Hypertension    Kidney stones    Neuropathy    On home oxygen  therapy    2 L / M   Pneumonia 06/2017   Sleep apnea    Ulcer of foot (HCC)    Right   Past Surgical History:  Procedure Laterality Date   APPENDECTOMY     DG FEET 2 VIEWS BILAT     IRRIGATION AND DEBRIDEMENT FOOT Left 07/11/2023   Procedure: IRRIGATION AND DEBRIDEMENT FOOT;  Surgeon: Ashley Soulier, DPM;  Location: [facility] ORS;  Service: Orthopedics/Podiatry;  Laterality: Left;   LIPOMA EXCISION Right 08/15/2017   Procedure: EXCISION TUMOR(CYST) FOOT;  Surgeon: Lilli Cough, DPM;  Location: [facility] ORS;  Service: Podiatry;  Laterality: Right;   LOWER EXTREMITY ANGIOGRAPHY Left 07/03/2023   Procedure: Lower Extremity Angiography;  Surgeon: Marea Selinda RAMAN, MD;  Location: [facility];  Service: Cardiovascular;  Laterality: Left;   OTHER SURGICAL HISTORY Bilateral Foot surgery   Social History:  reports that he has quit smoking. His smoking use included cigarettes. He started smoking about 7 years ago. He has a 3.8 pack-year smoking history. He has never used smokeless tobacco. He reports current alcohol use of about 1.0 standard drink of alcohol per week.  He reports that he does not use drugs.  Allergies  Allergen Reactions   Bee Venom Anaphylaxis   Linezolid  Other (See Comments)    Thrombocytopenia, hyperlactatemia   Azithromycin  Itching and Rash   Family History  Problem Relation Age of Onset   Hypertension Mother    Family history: Family history reviewed and not pertinent.  Prior to Admission medications   Medication Sig Start Date End Date Taking? Authorizing Provider  acetaminophen  (TYLENOL ) 325 MG tablet Take 650 mg by mouth every 6 (six) hours as needed.    [provider]  albuterol  (VENTOLIN  HFA) 108 (90 Base) MCG/ACT inhaler Inhale 2 puffs into the lungs every 4 (four) hours as needed for wheezing or shortness of  breath.    [provider]  allopurinol  (ZYLOPRIM ) 300 MG tablet Take 600 mg by mouth daily. 02/24/20   [provider]  Amino Acids-Protein Hydrolys (PRO-STAT SUGAR FREE PO) Take 30 mLs by mouth 2 (two) times daily.    [provider]  aspirin  EC 81 MG tablet Take 1 tablet by mouth daily.    [provider]  azelastine  (ASTELIN ) 0.1 % nasal spray Place 2 sprays into both nostrils 2 (two) times daily as needed. 05/27/23   [provider]  bisacodyl  (DULCOLAX) 5 MG EC tablet Take 1 tablet (5 mg total) by mouth daily as needed for moderate constipation. 09/04/23   Dorinda Drue DASEN, MD  bismuth  subsalicylate (STOMACH RELIEF) 262 MG/15ML suspension Take 15 mLs by mouth every 6 (six) hours as needed.    [provider]  Buprenorphine  HCl-Naloxone  HCl 8-2 MG FILM Place 1 Film under the tongue in the morning, at noon, and at bedtime.    [provider]  clopidogrel  (PLAVIX ) 75 MG tablet Take 1 tablet (75 mg total) by mouth daily. 02/02/21   Leotis Bogus, MD  cyanocobalamin  (VITAMIN B12) 1000 MCG/ML injection Inject 1 mL (1,000 mcg total) into the muscle every 30 (thirty) days. 07/12/22   Brahmanday, Govinda R, MD  dapagliflozin  propanediol (FARXIGA ) 10 MG TABS tablet Take 1 tablet by mouth daily. 03/16/24 03/16/25  [provider]  Dextromethorphan-guaiFENesin  10-100 MG/5ML liquid Take 5 mLs by mouth every 4 (four) hours as needed (cough). 10/01/23   [provider]  DULoxetine  (CYMBALTA ) 30 MG capsule Take 60 mg by mouth daily. 01/21/24   [provider]  ezetimibe  (ZETIA ) 10 MG tablet Take 10 mg by mouth daily. 02/24/20   [provider]  fluticasone  (FLONASE ) 50 MCG/ACT nasal spray Place 1 spray into both nostrils daily as needed for allergies. 10/16/18   [provider]  Fluticasone -Umeclidin-Vilant (TRELEGY ELLIPTA) 100-62.5-25 MCG/ACT AEPB Inhale 1 puff into the lungs in the morning. 09/12/21   [provider]  furosemide  (LASIX ) 20 MG tablet Take 1 tablet (20 mg total) by mouth daily. 02/23/24   Jens Durand, MD  gabapentin  (NEURONTIN ) 800 MG tablet Take 800 mg by mouth 4 (four) times daily. 10/22/23   [provider]  ipratropium-albuterol  (DUONEB) 0.5-2.5 (3) MG/3ML SOLN Take 3 mLs by nebulization every 6 (six) hours as needed (wheezing).    [provider]  metFORMIN  (GLUCOPHAGE ) 1000 MG tablet Take 1,000 mg by mouth daily.    [provider]  metoprolol  succinate (TOPROL -XL) 50 MG 24 hr tablet Take 50 mg by mouth daily. 02/24/20   [provider]  Multiple Vitamins-Minerals (MULTIVITAMIN ADULT, MINERALS, PO) Take 1 tablet by mouth every morning.    [provider]  naloxone  (NARCAN )  nasal spray 4 mg/0.1 mL Place 1 spray into the nose once.    [provider]  ondansetron  (ZOFRAN -ODT) 4 MG disintegrating tablet Take 4 mg by mouth every 8 (eight) hours as needed for nausea.    [provider]  OXYGEN  Inhale 3 L into the lungs.    [provider]  pantoprazole  (PROTONIX ) 40 MG tablet Take 40 mg by mouth daily as needed. 06/20/21   [provider]  roflumilast  (DALIRESP ) 500 MCG TABS tablet Take 500 mcg by mouth daily. 08/15/21   [provider]  simvastatin  (ZOCOR ) 40 MG tablet Take 40 mg by mouth at bedtime. 01/17/16   [provider]  Syringe/Needle, Disp, (SYRINGE 3CC/25GX1) 25G X 1 3 ML MISC 1 Syringe by Does not apply route every 30 (thirty) days. 07/12/22   Brahmanday, Govinda R, MD  traZODone  (DESYREL ) 50 MG tablet Take 50 mg by mouth at bedtime as needed for sleep. 01/22/24 01/21/25  [provider]   Physical Exam: Vitals:   07/15/24 1500 07/15/24 1600 07/15/24 1628 07/15/24 1803  BP: 132/73 (!) 122/94  (!) 160/92  Pulse: (!) 111 67 (!) 118 (!) 126  Resp: (!) 23 (!) 22 20   Temp:      TempSrc:      SpO2: 100% 93% 94% (!) 87%  Weight:      Height:       Constitutional:  appears frail, older than chronological age Eyes: PERRL, lids and conjunctivae normal ENMT: Mucous membranes are moist. Posterior pharynx clear of any exudate or lesions. Age-appropriate dentition. Hearing appropriate Neck: normal, supple, no masses, no thyromegaly Respiratory: Generalized decreased lung sounds bilaterally on auscultation, no wheezing, no crackles.  Increased respiratory effort.  Increased accessory muscle use.  Nasal cannula in place Cardiovascular: Regular rate and rhythm, no murmurs / rubs / gallops. No extremity edema. 2+ pedal pulses. No carotid bruits.  Abdomen: no tenderness, no masses palpated, no hepatosplenomegaly. Bowel sounds positive.  Musculoskeletal: no clubbing / cyanosis. No joint deformity upper and lower extremities. Good ROM, no contractures, no atrophy. Normal muscle tone.  Skin: no rashes, lesions, ulcers. No induration Neurologic: Sensation intact. Strength 5/5 in all 4.  Psychiatric: Normal judgment and insight. Alert and oriented x 3. Normal mood.   EKG: independently reviewed, showing sinus tachycardia with rate of 127, QTc 460  Chest x-ray on Admission: I personally reviewed and I agree with radiologist reading as below.  CT Angio Chest PE W and/or Wo Contrast Result Date: 07/15/2024 CLINICAL DATA:  Pulmonary embolism (PE) suspected, high prob. Shortness of breath. EXAM: CT ANGIOGRAPHY CHEST WITH CONTRAST TECHNIQUE: Multidetector CT imaging of the chest was performed using the standard protocol during bolus administration of intravenous contrast. Multiplanar CT image reconstructions and MIPs were obtained to evaluate the vascular anatomy. RADIATION DOSE REDUCTION: This exam was performed according to the departmental dose-optimization program which includes automated exposure control, adjustment of the mA and/or kV according to patient size and/or use of iterative reconstruction technique. CONTRAST:  75mL OMNIPAQUE  IOHEXOL  350 MG/ML SOLN COMPARISON:  CT  angiography chest from 02/19/2024. FINDINGS: Cardiovascular: No evidence of embolism to the proximal subsegmental pulmonary artery level. There is dilation of the main pulmonary trunk measuring up to 3.6 cm, which is nonspecific but can be seen with pulmonary artery hypertension. Normal cardiac size. No pericardial effusion. No aortic aneurysm. There are coronary artery calcifications, in keeping with coronary artery disease. There are also mild-to-moderate peripheral atherosclerotic vascular calcifications of thoracic aorta and its  major branches. Mediastinum/Nodes: Visualized thyroid  gland appears grossly unremarkable. No solid / cystic mediastinal masses. The esophagus is nondistended precluding optimal assessment. There is mild circumferential thickening of the lower thoracic esophagus, which is most likely seen in the settings of chronic gastroesophageal reflux disease versus esophagitis. There are multiple prominent mediastinal and hilar lymph nodes, which appear grossly similar to the prior study, and favored benign/reactive in the given clinical setting. No axillary lymphadenopathy by size criteria. Lungs/Pleura: The central tracheo-bronchial tree is patent. There is mild, smooth, circumferential thickening of the segmental and subsegmental bronchial walls, throughout bilateral lungs, which is nonspecific. Findings are most commonly seen with bronchitis or reactive airway disease, such as asthma. There are moderate diffuse emphysematous changes. There are several new airspace opacities in the middle lobe with largest opacity measuring up to 2.9 x 3.9 cm with air bronchogram. These are compatible with pneumonia. Redemonstration of a peripheral/pleural based approximately 1.9 x 2.4 cm opacity in the apicoposterior segment of left upper lobe with spiculated margin, compatible with patient's known malignancy. No other lung mass noted. No pleural effusion on either side. Upper Abdomen: Visualized upper abdominal  viscera within normal limits. Musculoskeletal: The visualized soft tissues of the chest wall are grossly unremarkable. There are mild multilevel degenerative changes in the visualized spine. Redemonstration of focal destruction of the lateral aspect of left fourth rib at the level of patient's known spiculated mass, compatible with treated metastasis. Multiple old healed left rib fractures noted. Review of the MIP images confirms the above findings. IMPRESSION: 1. No embolism to the proximal subsegmental pulmonary artery level. There is dilation of the main pulmonary trunk, which is nonspecific but can be seen with pulmonary artery hypertension. 2. There are several new airspace opacities in the (right) middle lobe with largest opacity measuring up to 2.9 x 3.9 cm with air bronchogram. These are compatible with pneumonia. Follow-up to clearing is recommended. 3. Redemonstration of a peripheral/pleural based approximately 1.9 x 2.4 cm opacity in the apicoposterior segment of left upper lobe with spiculated margin, compatible with patient's known malignancy. There is adjacent focal right lateral fourth rib bone destruction, unchanged. 4. Multiple other nonacute observations, as described above. Aortic Atherosclerosis (ICD10-I70.0) and Emphysema (ICD10-J43.9). Electronically Signed   By: Ree Molt M.D.   On: 07/15/2024 17:58   DG Chest Port 1 View Result Date: 07/15/2024 CLINICAL DATA:  [id number] SOB (shortness of breath) 141880. EXAM: PORTABLE CHEST 1 VIEW COMPARISON:  02/19/2024. FINDINGS: Bilateral lungs appear hyperexpanded and hyperlucent with coarse bronchovascular markings, in keeping with COPD. There are superimposed new heterogeneous opacities overlying the right lower lung zone, without volume loss, concerning for pneumonia. Correlate clinically. Follow-up to clearing is recommended. Redemonstration of linear areas of atelectasis/scarring overlying bilateral lower lung zones. There is also a stable  horizontal opacity along the lateral aspect of left mid lung zone. Bilateral costophrenic angles are clear. Stable cardio-mediastinal silhouette. No acute osseous abnormalities. The soft tissues are within normal limits. IMPRESSION: *COPD. New heterogeneous opacities overlying the right lower lung zone, concerning for pneumonia. Follow-up to clearing is recommended. Electronically Signed   By: Ree Molt M.D.   On: 07/15/2024 14:10   Labs on Admission: I have personally reviewed following labs  CBC: Recent Labs  Lab 07/12/24 1421 07/15/24 1327  WBC 13.4* 27.2*  HGB 13.5 12.8*  HCT 42.2 41.9  MCV 90.8 94.2  PLT 288 258   Basic Metabolic Panel: Recent Labs  Lab 07/15/24 1327  NA 137  K 4.3  CL 98  CO2 27  GLUCOSE 174*  BUN 22  CREATININE 1.30*  CALCIUM  8.9   GFR: Estimated Creatinine Clearance: 50.1 mL/min (A) (by C-G formula based on SCr of 1.3 mg/dL (H)).  Coagulation Profile: Recent Labs  Lab 07/15/24 1327  INR 1.2   Urine analysis:    Component Value Date/Time   COLORURINE YELLOW (A) 07/02/2023 1406   APPEARANCEUR CLEAR (A) 07/02/2023 1406   APPEARANCEUR Clear 03/24/2013 1853   LABSPEC 1.009 07/02/2023 1406   LABSPEC 1.020 03/24/2013 1853   PHURINE 6.0 07/02/2023 1406   GLUCOSEU NEGATIVE 07/02/2023 1406   GLUCOSEU Negative 03/24/2013 1853   HGBUR LARGE (A) 07/02/2023 1406   BILIRUBINUR NEGATIVE 07/02/2023 1406   BILIRUBINUR Negative 03/24/2013 1853   KETONESUR NEGATIVE 07/02/2023 1406   PROTEINUR NEGATIVE 07/02/2023 1406   NITRITE NEGATIVE 07/02/2023 1406   LEUKOCYTESUR NEGATIVE 07/02/2023 1406   LEUKOCYTESUR Negative 03/24/2013 1853   CRITICAL CARE Performed by: Dr. Sherre  Total critical care time: 35 minutes  Critical care time was exclusive of separately billable procedures and treating other patients.  Critical care was necessary to treat or prevent imminent or life-threatening deterioration.  Critical care was time spent personally by me on  the following activities: development of treatment plan with patient and daughter as well as nursing, discussions with consultants, evaluation of patient's response to treatment, examination of patient, obtaining history from patient or surrogate, ordering and performing treatments and interventions, ordering and review of laboratory studies, ordering and review of radiographic studies, pulse oximetry and re-evaluation of patient's condition.  This document was prepared using Dragon Voice Recognition software and may include unintentional dictation errors.  Dr. Sherre [facility]  If 7PM-7AM, please contact overnight-coverage provider If 7AM-7PM, please contact day attending provider [email or web address]  07/15/2024, 6:04 PM

## 2024-07-16 DIAGNOSIS — J9621 Acute and chronic respiratory failure with hypoxia: Secondary | ICD-10-CM

## 2024-07-16 DIAGNOSIS — R6520 Severe sepsis without septic shock: Secondary | ICD-10-CM

## 2024-07-16 DIAGNOSIS — J189 Pneumonia, unspecified organism: Secondary | ICD-10-CM

## 2024-07-16 DIAGNOSIS — J449 Chronic obstructive pulmonary disease, unspecified: Secondary | ICD-10-CM

## 2024-07-16 DIAGNOSIS — E785 Hyperlipidemia, unspecified: Secondary | ICD-10-CM

## 2024-07-16 DIAGNOSIS — A419 Sepsis, unspecified organism: Secondary | ICD-10-CM | POA: Diagnosis not present

## 2024-07-16 LAB — CBC
HCT: 36.5 % — ABNORMAL LOW (ref 39.0–52.0)
Hemoglobin: 11.2 g/dL — ABNORMAL LOW (ref 13.0–17.0)
MCH: 29 pg (ref 26.0–34.0)
MCHC: 30.7 g/dL (ref 30.0–36.0)
MCV: 94.6 fL (ref 80.0–100.0)
Platelets: 223 K/uL (ref 150–400)
RBC: 3.86 MIL/uL — ABNORMAL LOW (ref 4.22–5.81)
RDW: 15.3 % (ref 11.5–15.5)
WBC: 10.6 K/uL — ABNORMAL HIGH (ref 4.0–10.5)
nRBC: 0 % (ref 0.0–0.2)

## 2024-07-16 LAB — BASIC METABOLIC PANEL WITH GFR
Anion gap: 7 (ref 5–15)
BUN: 23 mg/dL (ref 8–23)
CO2: 28 mmol/L (ref 22–32)
Calcium: 8.5 mg/dL — ABNORMAL LOW (ref 8.9–10.3)
Chloride: 107 mmol/L (ref 98–111)
Creatinine, Ser: 1.32 mg/dL — ABNORMAL HIGH (ref 0.61–1.24)
GFR, Estimated: 58 mL/min — ABNORMAL LOW (ref >60)
Glucose, Bld: 165 mg/dL — ABNORMAL HIGH (ref 70–99)
Potassium: 4.3 mmol/L (ref 3.5–5.1)
Sodium: 142 mmol/L (ref 135–145)

## 2024-07-16 LAB — GLUCOSE, CAPILLARY
Glucose-Capillary: 129 mg/dL — ABNORMAL HIGH (ref 70–99)
Glucose-Capillary: 171 mg/dL — ABNORMAL HIGH (ref 70–99)
Glucose-Capillary: 178 mg/dL — ABNORMAL HIGH (ref 70–99)
Glucose-Capillary: 123 mg/dL — ABNORMAL HIGH (ref 70–99)

## 2024-07-16 MED ORDER — PANTOPRAZOLE SODIUM 40 MG PO TBEC: 40.0000 mg | DELAYED_RELEASE_TABLET | Freq: Every day | ORAL | Status: DC | PRN

## 2024-07-16 MED ORDER — METFORMIN HCL 500 MG PO TABS: 1000.0000 mg | ORAL_TABLET | Freq: Every day | ORAL | Status: DC

## 2024-07-16 MED ORDER — EZETIMIBE 10 MG PO TABS
10.0000 mg | ORAL_TABLET | Freq: Every day | ORAL | Status: DC
  Administered 2024-07-16: 10 mg via ORAL
  Filled 2024-07-16: qty 1

## 2024-07-16 MED ORDER — DULOXETINE HCL 30 MG PO CPEP
60.0000 mg | ORAL_CAPSULE | Freq: Every day | ORAL | Status: DC
  Administered 2024-07-16 – 2024-07-17 (×2): 60 mg via ORAL
  Filled 2024-07-16 (×2): qty 2

## 2024-07-16 MED ORDER — BUPRENORPHINE HCL-NALOXONE HCL 8-2 MG SL SUBL
1.0000 | SUBLINGUAL_TABLET | Freq: Three times a day (TID) | SUBLINGUAL | Status: DC
  Administered 2024-07-16 – 2024-07-17 (×4): 1 via SUBLINGUAL
  Filled 2024-07-16 (×4): qty 1

## 2024-07-16 MED ORDER — GABAPENTIN 400 MG PO CAPS
800.0000 mg | ORAL_CAPSULE | Freq: Four times a day (QID) | ORAL | Status: DC
  Administered 2024-07-16 – 2024-07-17 (×5): 800 mg via ORAL
  Filled 2024-07-16 (×5): qty 2

## 2024-07-16 MED ORDER — HYDROXYZINE HCL 10 MG PO TABS
10.0000 mg | ORAL_TABLET | Freq: Once | ORAL | Status: AC
  Administered 2024-07-16: 10 mg via ORAL
  Filled 2024-07-16: qty 1

## 2024-07-16 MED ORDER — METOPROLOL SUCCINATE ER 50 MG PO TB24
50.0000 mg | ORAL_TABLET | Freq: Every day | ORAL | Status: DC
  Administered 2024-07-16 – 2024-07-17 (×2): 50 mg via ORAL
  Filled 2024-07-16 (×2): qty 1

## 2024-07-16 MED ORDER — HYDROXYZINE HCL 10 MG PO TABS
10.0000 mg | ORAL_TABLET | Freq: Once | ORAL | Status: AC
  Administered 2024-07-17: 10 mg via ORAL
  Filled 2024-07-16: qty 1

## 2024-07-16 MED ORDER — ASPIRIN 81 MG PO TBEC
81.0000 mg | DELAYED_RELEASE_TABLET | Freq: Every day | ORAL | Status: DC
  Administered 2024-07-16 – 2024-07-17 (×2): 81 mg via ORAL
  Filled 2024-07-16 (×2): qty 1

## 2024-07-16 MED ORDER — ADULT MULTIVITAMIN W/MINERALS CH
1.0000 | ORAL_TABLET | Freq: Every morning | ORAL | Status: DC
  Administered 2024-07-16 – 2024-07-17 (×2): 1 via ORAL
  Filled 2024-07-16 (×2): qty 1

## 2024-07-16 MED ORDER — ALLOPURINOL 100 MG PO TABS
600.0000 mg | ORAL_TABLET | Freq: Every day | ORAL | Status: DC
  Administered 2024-07-16 – 2024-07-17 (×2): 600 mg via ORAL
  Filled 2024-07-16 (×2): qty 6

## 2024-07-16 MED ORDER — DOXYCYCLINE HYCLATE 100 MG PO TABS
100.0000 mg | ORAL_TABLET | Freq: Two times a day (BID) | ORAL | Status: DC
  Administered 2024-07-16 – 2024-07-17 (×2): 100 mg via ORAL
  Filled 2024-07-16 (×2): qty 1

## 2024-07-16 MED ORDER — ROFLUMILAST 500 MCG PO TABS
500.0000 ug | ORAL_TABLET | Freq: Every day | ORAL | Status: DC
  Administered 2024-07-16 – 2024-07-17 (×2): 500 ug via ORAL
  Filled 2024-07-16 (×2): qty 1

## 2024-07-16 MED ORDER — TRAZODONE HCL 50 MG PO TABS
50.0000 mg | ORAL_TABLET | Freq: Every evening | ORAL | Status: DC | PRN
  Administered 2024-07-16: 50 mg via ORAL
  Filled 2024-07-16: qty 1

## 2024-07-16 MED ORDER — FUROSEMIDE 20 MG PO TABS
20.0000 mg | ORAL_TABLET | Freq: Every day | ORAL | Status: DC
  Administered 2024-07-16 – 2024-07-17 (×2): 20 mg via ORAL
  Filled 2024-07-16 (×2): qty 1

## 2024-07-16 NOTE — Plan of Care (Signed)

## 2024-07-16 NOTE — Care Management Important Message (Signed)
 Important Message  Patient Details  Name: Christian Fields MRN: [id number] Date of Birth: 03/17/1956   Important Message Given:  Yes - Medicare IM     [name] 07/16/2024, 1:26 PM

## 2024-07-16 NOTE — Progress Notes (Signed)
 [facility]  - [facility] at [facility]   PATIENT NAME: Christian Fields    MR#:  [id number]  DATE OF BIRTH:  [birth year]  SUBJECTIVE:  daughter at bedside patient came in with increasing shortness of breath and hypoxia. Normally wears 4 L of nasal cannula oxygen  was found to be hypoxic in the upper 80s and oxygen  respond to 6 L in the ER yesterday. Found to have pneumonia right middle lobe. Overall feels better than yesterday. Able to complete sentence without getting winded.    VITALS:  Blood pressure 130/79, pulse 80, temperature 97.8 F (36.6 C), temperature source Oral, resp. rate 14, height 5' 7 (1.702 m), weight 77.1 kg, SpO2 94%.  PHYSICAL EXAMINATION:   GENERAL:  68 y.o.-year-old patient with no acute distress. Chronically ill LUNGS: distant breath sounds breath sounds bilaterally, no wheezing CARDIOVASCULAR: S1, S2 normal. No murmur   ABDOMEN: Soft, nontender, nondistended. Bowel sounds present.  EXTREMITIES: No  edema b/l.    NEUROLOGIC: nonfocal  patient is alert and awake SKIN: per RN  LABORATORY PANEL:  CBC Recent Labs  Lab 07/16/24 0319  WBC 10.6*  HGB 11.2*  HCT 36.5*  PLT 223    Chemistries  Recent Labs  Lab 07/16/24 0319  NA 142  K 4.3  CL 107  CO2 28  GLUCOSE 165*  BUN 23  CREATININE 1.32*  CALCIUM  8.5*   Cardiac Enzymes No results for input(s): TROPONINI in the last 168 hours. RADIOLOGY:  CT Angio Chest PE W and/or Wo Contrast Result Date: 07/15/2024 CLINICAL DATA:  Pulmonary embolism (PE) suspected, high prob. Shortness of breath. EXAM: CT ANGIOGRAPHY CHEST WITH CONTRAST TECHNIQUE: Multidetector CT imaging of the chest was performed using the standard protocol during bolus administration of intravenous contrast. Multiplanar CT image reconstructions and MIPs were obtained to evaluate the vascular anatomy. RADIATION DOSE REDUCTION: This exam was performed according to the departmental dose-optimization program which includes  automated exposure control, adjustment of the mA and/or kV according to patient size and/or use of iterative reconstruction technique. CONTRAST:  75mL OMNIPAQUE  IOHEXOL  350 MG/ML SOLN COMPARISON:  CT angiography chest from 02/19/2024. FINDINGS: Cardiovascular: No evidence of embolism to the proximal subsegmental pulmonary artery level. There is dilation of the main pulmonary trunk measuring up to 3.6 cm, which is nonspecific but can be seen with pulmonary artery hypertension. Normal cardiac size. No pericardial effusion. No aortic aneurysm. There are coronary artery calcifications, in keeping with coronary artery disease. There are also mild-to-moderate peripheral atherosclerotic vascular calcifications of thoracic aorta and its major branches. Mediastinum/Nodes: Visualized thyroid  gland appears grossly unremarkable. No solid / cystic mediastinal masses. The esophagus is nondistended precluding optimal assessment. There is mild circumferential thickening of the lower thoracic esophagus, which is most likely seen in the settings of chronic gastroesophageal reflux disease versus esophagitis. There are multiple prominent mediastinal and hilar lymph nodes, which appear grossly similar to the prior study, and favored benign/reactive in the given clinical setting. No axillary lymphadenopathy by size criteria. Lungs/Pleura: The central tracheo-bronchial tree is patent. There is mild, smooth, circumferential thickening of the segmental and subsegmental bronchial walls, throughout bilateral lungs, which is nonspecific. Findings are most commonly seen with bronchitis or reactive airway disease, such as asthma. There are moderate diffuse emphysematous changes. There are several new airspace opacities in the middle lobe with largest opacity measuring up to 2.9 x 3.9 cm with air bronchogram. These are compatible with pneumonia. Redemonstration of a peripheral/pleural based approximately 1.9 x 2.4 cm opacity in the  apicoposterior  segment of left upper lobe with spiculated margin, compatible with patient's known malignancy. No other lung mass noted. No pleural effusion on either side. Upper Abdomen: Visualized upper abdominal viscera within normal limits. Musculoskeletal: The visualized soft tissues of the chest wall are grossly unremarkable. There are mild multilevel degenerative changes in the visualized spine. Redemonstration of focal destruction of the lateral aspect of left fourth rib at the level of patient's known spiculated mass, compatible with treated metastasis. Multiple old healed left rib fractures noted. Review of the MIP images confirms the above findings. IMPRESSION: 1. No embolism to the proximal subsegmental pulmonary artery level. There is dilation of the main pulmonary trunk, which is nonspecific but can be seen with pulmonary artery hypertension. 2. There are several new airspace opacities in the (right) middle lobe with largest opacity measuring up to 2.9 x 3.9 cm with air bronchogram. These are compatible with pneumonia. Follow-up to clearing is recommended. 3. Redemonstration of a peripheral/pleural based approximately 1.9 x 2.4 cm opacity in the apicoposterior segment of left upper lobe with spiculated margin, compatible with patient's known malignancy. There is adjacent focal right lateral fourth rib bone destruction, unchanged. 4. Multiple other nonacute observations, as described above. Aortic Atherosclerosis (ICD10-I70.0) and Emphysema (ICD10-J43.9). Electronically Signed   By: Ree Molt M.D.   On: 07/15/2024 17:58   DG Chest Port 1 View Result Date: 07/15/2024 CLINICAL DATA:  141880 SOB (shortness of breath) 141880. EXAM: PORTABLE CHEST 1 VIEW COMPARISON:  02/19/2024. FINDINGS: Bilateral lungs appear hyperexpanded and hyperlucent with coarse bronchovascular markings, in keeping with COPD. There are superimposed new heterogeneous opacities overlying the right lower lung zone, without volume loss, concerning  for pneumonia. Correlate clinically. Follow-up to clearing is recommended. Redemonstration of linear areas of atelectasis/scarring overlying bilateral lower lung zones. There is also a stable horizontal opacity along the lateral aspect of left mid lung zone. Bilateral costophrenic angles are clear. Stable cardio-mediastinal silhouette. No acute osseous abnormalities. The soft tissues are within normal limits. IMPRESSION: *COPD. New heterogeneous opacities overlying the right lower lung zone, concerning for pneumonia. Follow-up to clearing is recommended. Electronically Signed   By: Ree Molt M.D.   On: 07/15/2024 14:10    Assessment and Plan Jael Kostick is a 68 year old male with history of asthma, hypertension, hyperlipidemia, anxiety, COPD, depression, GERD, who presents ED for chief concerns of shortness of breath since yesterday.    Acute on chronic hypoxic respiratory failure (HCC) chronic respiratory failure on 4 L nasal cannula oxygen /end stage COPD severe sepsis due to community acquired pneumonia right middle lobe  --Continue treatment with IV antibiotics: Doxycycline   100 mg po twice daily and ceftriaxone  2 g IV daily to complete a 5-day course --Continuous pulse oximetry --Maintain SpO2 greater than 92% --Incentive spirometry, flutter valve -- patient currently not wheezing. Received dose of Solu-Medrol  yesterday. Will hold off for now. -- Overall feels better today. -- Blood culture negative -- patient follows with [facility] pulmonology Dr. 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 Melanee as appropriate   Tobacco use disorder --As needed nicotine  patch ordered   Type 2 diabetes mellitus (HCC) --Home Farxiga , metformin   --Insulin  SSI with at bedtime coverage ordered   TIA (transient ischemic attack) --Simvastatin  and asa   Hypertension Resume home meds   HLD (hyperlipidemia) --cont statins    CKD stage 3a, GFR 45-59 ml/min (HCC) At baseline      Procedures: Family communication :  daughter at bedside Consults :none CODE STATUS: full DVT Prophylaxis : heparin  Level of care: Telemetry Medical Status is: Inpatient Remains inpatient appropriate because: sepsis due to PNA    TOTAL TIME TAKING CARE OF THIS PATIENT: 40 minutes.  >50% time spent on counselling and coordination of care  Note: This dictation was prepared with Dragon dictation along with smaller phrase technology. Any transcriptional errors that result from this process are unintentional.  Leita Blanch M.D    [facility]   CC: Primary care physician; Lora Odor, FNP

## 2024-07-17 DIAGNOSIS — C3492 Malignant neoplasm of unspecified part of left bronchus or lung: Secondary | ICD-10-CM

## 2024-07-17 DIAGNOSIS — A419 Sepsis, unspecified organism: Secondary | ICD-10-CM | POA: Diagnosis not present

## 2024-07-17 DIAGNOSIS — J449 Chronic obstructive pulmonary disease, unspecified: Secondary | ICD-10-CM | POA: Diagnosis not present

## 2024-07-17 DIAGNOSIS — J9611 Chronic respiratory failure with hypoxia: Secondary | ICD-10-CM

## 2024-07-17 DIAGNOSIS — J189 Pneumonia, unspecified organism: Secondary | ICD-10-CM | POA: Diagnosis not present

## 2024-07-17 LAB — GLUCOSE, CAPILLARY
Glucose-Capillary: 102 mg/dL — ABNORMAL HIGH (ref 70–99)
Glucose-Capillary: 155 mg/dL — ABNORMAL HIGH (ref 70–99)

## 2024-07-17 LAB — HEMOGLOBIN A1C
Hgb A1c MFr Bld: 6 % — ABNORMAL HIGH (ref 4.8–5.6)
Mean Plasma Glucose: 126 mg/dL

## 2024-07-17 MED ORDER — CEFUROXIME AXETIL 500 MG PO TABS: 500.0000 mg | ORAL_TABLET | Freq: Two times a day (BID) | ORAL | 0 refills | Status: AC

## 2024-07-17 MED ORDER — DOXYCYCLINE HYCLATE 100 MG PO TABS: 100.0000 mg | ORAL_TABLET | Freq: Two times a day (BID) | ORAL | 0 refills | Status: AC

## 2024-07-17 MED ORDER — CEFUROXIME AXETIL 500 MG PO TABS: 500.0000 mg | ORAL_TABLET | Freq: Two times a day (BID) | ORAL | Status: DC

## 2024-07-17 NOTE — Discharge Instructions (Signed)
 Use your oxygen,inhalers,nebulizer as before

## 2024-07-17 NOTE — Discharge Summary (Signed)
 Physician Discharge Summary   Patient: Christian Fields MRN: [id number] DOB: 07/23/1956  Admit date:     07/15/2024  Discharge date: 07/17/24  Discharge Physician: Leita Blanch   PCP: Lora Odor, FNP   Recommendations at discharge:   follow-up PCP in 1 to 2 weeks follow-up with Dr. Theotis pulmonology in 1 to 2 week  Discharge Diagnoses: Principal Problem:   Acute hypoxic respiratory failure (HCC) Active Problems:   Tobacco use disorder   CAP (community acquired pneumonia)   NSCLC of left lung (HCC)   Severe sepsis (HCC)   COPD (chronic obstructive pulmonary disease) (HCC)   Hypertension   Chronic pain syndrome   TIA (transient ischemic attack)   Type 2 diabetes mellitus (HCC)   Type II diabetes mellitus with renal manifestations (HCC)   Chronic respiratory failure with hypoxia (HCC)   HLD (hyperlipidemia)   Depression with anxiety   Anxiety   CKD stage 3a, GFR 45-59 ml/min (HCC)  Christian Fields is a 68 year old male with history of asthma, hypertension, hyperlipidemia, anxiety, COPD, depression, GERD, who presents ED for chief concerns of shortness of breath since yesterday.     Acute on chronic hypoxic respiratory failure (HCC) chronic respiratory failure on 4 L nasal cannula oxygen /end stage COPD severe sepsis due to community acquired pneumonia right middle lobe  --Continue treatment with IV antibiotics: Doxycycline   100 mg po twice daily and ceftriaxone  2 g IV daily to complete a 5-day course --Continuous pulse oximetry --Maintain SpO2 greater than 92% --Incentive spirometry, flutter valve -- patient currently not wheezing. Received dose of Solu-Medrol  yesterday. Will hold off for now. -- Overall feels better today. -- Blood culture negative -- patient follows with [facility] pulmonology Dr. Theotis --8/9--overall feeling better and at baseline. BC neg. -- will switch to oral antibiotic and patient will follow-up with pulmonary as outpatient    NSCLC of left lung (HCC)  status post radiation treatment --Continue outpatient follow-up with medical oncology Dr. Melanee as appropriate   Tobacco use disorder --As needed nicotine  patch ordered   Type 2 diabetes mellitus (HCC) --Home Farxiga , metformin   --Insulin  SSI with at bedtime coverage ordered   TIA (transient ischemic attack) --Simvastatin  and asa   Hypertension Resume home meds   HLD (hyperlipidemia) --cont statins   CKD stage 3a, GFR 45-59 ml/min (HCC) At baseline   patient agreeable for discharge. Discussed with him to finish of oral antibiotic and follow-up with PCP/pulmonary as       Procedures:none Family communication : none today  consults :none CODE STATUS: full DVT Prophylaxis : heparin       Pain control - [place]  Controlled Substance Reporting System database was reviewed. and patient was instructed, not to drive, operate heavy machinery, perform activities at heights, swimming or participation in water activities or provide baby-sitting services while on Pain, Sleep and Anxiety Medications; until their outpatient Physician has advised to do so again. Also recommended to not to take more than prescribed Pain, Sleep and Anxiety Medications.   Disposition: Home Diet recommendation:  Discharge Diet Orders (From admission, onward)     Start     Ordered   07/17/24 0000  Diet - low sodium heart healthy        07/17/24 1034           Cardiac diet DISCHARGE MEDICATION: Allergies as of 07/17/2024       Reactions   Bee Venom Anaphylaxis   Linezolid  Other (See Comments)   Thrombocytopenia, hyperlactatemia   Azithromycin  Itching, Rash  Medication List     TAKE these medications    acetaminophen  325 MG tablet Commonly known as: TYLENOL  Take 650 mg by mouth every 6 (six) hours as needed.   albuterol  108 (90 Base) MCG/ACT inhaler Commonly known as: VENTOLIN  HFA Inhale 2 puffs into the lungs every 4 (four) hours as needed for wheezing or shortness of  breath.   allopurinol  300 MG tablet Commonly known as: ZYLOPRIM  Take 600 mg by mouth daily.   aspirin  EC 81 MG tablet Take 1 tablet by mouth daily.   azelastine  0.1 % nasal spray Commonly known as: ASTELIN  Place 2 sprays into both nostrils 2 (two) times daily as needed.   bisacodyl  5 MG EC tablet Generic drug: bisacodyl  Take 1 tablet (5 mg total) by mouth daily as needed for moderate constipation.   Buprenorphine  HCl-Naloxone  HCl 8-2 MG Film Place 1 Film under the tongue in the morning, at noon, and at bedtime.   cefUROXime  500 MG tablet Commonly known as: CEFTIN  Take 1 tablet (500 mg total) by mouth 2 (two) times daily with a meal for 5 days. Start taking on: July 18, 2024   clopidogrel  75 MG tablet Commonly known as: PLAVIX  Take 1 tablet (75 mg total) by mouth daily.   dapagliflozin  propanediol 10 MG Tabs tablet Commonly known as: FARXIGA  Take 1 tablet by mouth daily.   Dextromethorphan-guaiFENesin  10-100 MG/5ML liquid Take 5 mLs by mouth every 4 (four) hours as needed (cough).   doxycycline  100 MG tablet Commonly known as: VIBRA -TABS Take 1 tablet (100 mg total) by mouth every 12 (twelve) hours for 4 days.   DULoxetine  60 MG capsule Commonly known as: CYMBALTA  Take 60 mg by mouth daily.   ezetimibe  10 MG tablet Commonly known as: ZETIA  Take 10 mg by mouth daily.   fluticasone  50 MCG/ACT nasal spray Commonly known as: FLONASE  Place 1 spray into both nostrils daily as needed for allergies.   furosemide  20 MG tablet Commonly known as: LASIX  Take 1 tablet (20 mg total) by mouth daily.   gabapentin  800 MG tablet Commonly known as: NEURONTIN  Take 800 mg by mouth 4 (four) times daily.   ipratropium-albuterol  0.5-2.5 (3) MG/3ML Soln Commonly known as: DUONEB Take 3 mLs by nebulization every 6 (six) hours as needed (wheezing).   metFORMIN  1000 MG tablet Commonly known as: GLUCOPHAGE  Take 1,000 mg by mouth daily.   metoprolol  succinate 50 MG 24 hr  tablet Commonly known as: TOPROL -XL Take 50 mg by mouth daily.   MULTIVITAMIN ADULT (MINERALS) PO Take 1 tablet by mouth every morning.   naloxone  4 MG/0.1ML Liqd nasal spray kit Commonly known as: NARCAN  Place 1 spray into the nose once.   Ohtuvayre  3 MG/2.5ML Susp Generic drug: Ensifentrine  Take 5 mLs by mouth daily.   ondansetron  4 MG disintegrating tablet Commonly known as: ZOFRAN -ODT Take 4 mg by mouth every 8 (eight) hours as needed for nausea.   OXYGEN  Inhale 3 L into the lungs.   pantoprazole  40 MG tablet Commonly known as: PROTONIX  Take 40 mg by mouth daily as needed.   PRO-STAT SUGAR FREE PO Take 30 mLs by mouth 2 (two) times daily.   roflumilast  500 MCG Tabs tablet Commonly known as: DALIRESP  Take 500 mcg by mouth daily.   simvastatin  40 MG tablet Commonly known as: ZOCOR  Take 40 mg by mouth at bedtime.   Stomach Relief 525 MG/30ML suspension Generic drug: bismuth  subsalicylate Take 15 mLs by mouth every 6 (six) hours as needed.   SYRINGE 3CC/25GX1  25G X 1 3 ML Misc 1 Syringe by Does not apply route every 30 (thirty) days.   traZODone  50 MG tablet Commonly known as: DESYREL  Take 50 mg by mouth at bedtime as needed for sleep.   Trelegy Ellipta 100-62.5-25 MCG/ACT Aepb Generic drug: Fluticasone -Umeclidin-Vilant Inhale 1 puff into the lungs in the morning.        Follow-up Information     Lora Odor, FNP. Schedule an appointment as soon as possible for a visit in 1 week(s).   Specialty: Family Medicine Contact information: [address] [place] [address] [phone]         Theotis Lavelle BRAVO, MD. Schedule an appointment as soon as possible for a visit in 1 week(s).   Specialty: Specialist Why: CAP and COPD Contact information: [address] [place] [address] [phone]                Discharge Exam: Filed Weights   07/15/24 1325  Weight: 77.1 kg   GENERAL:  68 y.o.-year-old patient with no  acute distress. Chronically ill LUNGS: distant breath sounds breath sounds bilaterally, no wheezing CARDIOVASCULAR: S1, S2 normal. No murmur   ABDOMEN: Soft, nontender, nondistended. Bowel sounds present.  EXTREMITIES: No  edema b/l.    NEUROLOGIC: nonfocal  patient is alert and awake  Condition at discharge: fair  The results of significant diagnostics from this hospitalization (including imaging, microbiology, ancillary and laboratory) are listed below for reference.   Imaging Studies: CT Angio Chest PE W and/or Wo Contrast Result Date: 07/15/2024 CLINICAL DATA:  Pulmonary embolism (PE) suspected, high prob. Shortness of breath. EXAM: CT ANGIOGRAPHY CHEST WITH CONTRAST TECHNIQUE: Multidetector CT imaging of the chest was performed using the standard protocol during bolus administration of intravenous contrast. Multiplanar CT image reconstructions and MIPs were obtained to evaluate the vascular anatomy. RADIATION DOSE REDUCTION: This exam was performed according to the departmental dose-optimization program which includes automated exposure control, adjustment of the mA and/or kV according to patient size and/or use of iterative reconstruction technique. CONTRAST:  75mL OMNIPAQUE  IOHEXOL  350 MG/ML SOLN COMPARISON:  CT angiography chest from 02/19/2024. FINDINGS: Cardiovascular: No evidence of embolism to the proximal subsegmental pulmonary artery level. There is dilation of the main pulmonary trunk measuring up to 3.6 cm, which is nonspecific but can be seen with pulmonary artery hypertension. Normal cardiac size. No pericardial effusion. No aortic aneurysm. There are coronary artery calcifications, in keeping with coronary artery disease. There are also mild-to-moderate peripheral atherosclerotic vascular calcifications of thoracic aorta and its major branches. Mediastinum/Nodes: Visualized thyroid  gland appears grossly unremarkable. No solid / cystic mediastinal masses. The esophagus is nondistended  precluding optimal assessment. There is mild circumferential thickening of the lower thoracic esophagus, which is most likely seen in the settings of chronic gastroesophageal reflux disease versus esophagitis. There are multiple prominent mediastinal and hilar lymph nodes, which appear grossly similar to the prior study, and favored benign/reactive in the given clinical setting. No axillary lymphadenopathy by size criteria. Lungs/Pleura: The central tracheo-bronchial tree is patent. There is mild, smooth, circumferential thickening of the segmental and subsegmental bronchial walls, throughout bilateral lungs, which is nonspecific. Findings are most commonly seen with bronchitis or reactive airway disease, such as asthma. There are moderate diffuse emphysematous changes. There are several new airspace opacities in the middle lobe with largest opacity measuring up to 2.9 x 3.9 cm with air bronchogram. These are compatible with pneumonia. Redemonstration of a peripheral/pleural based approximately 1.9 x 2.4 cm opacity in  the apicoposterior segment of left upper lobe with spiculated margin, compatible with patient's known malignancy. No other lung mass noted. No pleural effusion on either side. Upper Abdomen: Visualized upper abdominal viscera within normal limits. Musculoskeletal: The visualized soft tissues of the chest wall are grossly unremarkable. There are mild multilevel degenerative changes in the visualized spine. Redemonstration of focal destruction of the lateral aspect of left fourth rib at the level of patient's known spiculated mass, compatible with treated metastasis. Multiple old healed left rib fractures noted. Review of the MIP images confirms the above findings. IMPRESSION: 1. No embolism to the proximal subsegmental pulmonary artery level. There is dilation of the main pulmonary trunk, which is nonspecific but can be seen with pulmonary artery hypertension. 2. There are several new airspace opacities  in the (right) middle lobe with largest opacity measuring up to 2.9 x 3.9 cm with air bronchogram. These are compatible with pneumonia. Follow-up to clearing is recommended. 3. Redemonstration of a peripheral/pleural based approximately 1.9 x 2.4 cm opacity in the apicoposterior segment of left upper lobe with spiculated margin, compatible with patient's known malignancy. There is adjacent focal right lateral fourth rib bone destruction, unchanged. 4. Multiple other nonacute observations, as described above. Aortic Atherosclerosis (ICD10-I70.0) and Emphysema (ICD10-J43.9). Electronically Signed   By: Ree Molt M.D.   On: 07/15/2024 17:58   DG Chest Port 1 View Result Date: 07/15/2024 CLINICAL DATA:  141880 SOB (shortness of breath) 141880. EXAM: PORTABLE CHEST 1 VIEW COMPARISON:  02/19/2024. FINDINGS: Bilateral lungs appear hyperexpanded and hyperlucent with coarse bronchovascular markings, in keeping with COPD. There are superimposed new heterogeneous opacities overlying the right lower lung zone, without volume loss, concerning for pneumonia. Correlate clinically. Follow-up to clearing is recommended. Redemonstration of linear areas of atelectasis/scarring overlying bilateral lower lung zones. There is also a stable horizontal opacity along the lateral aspect of left mid lung zone. Bilateral costophrenic angles are clear. Stable cardio-mediastinal silhouette. No acute osseous abnormalities. The soft tissues are within normal limits. IMPRESSION: *COPD. New heterogeneous opacities overlying the right lower lung zone, concerning for pneumonia. Follow-up to clearing is recommended. Electronically Signed   By: Ree Molt M.D.   On: 07/15/2024 14:10    Microbiology: Results for orders placed or performed during the hospital encounter of 07/15/24  Blood Culture (routine x 2)     Status: None (Preliminary result)   Collection Time: 07/15/24  2:31 PM   Specimen: BLOOD  Result Value Ref Range Status    Specimen Description BLOOD BLOOD LEFT ARM  Final   Special Requests   Final    BOTTLES DRAWN AEROBIC AND ANAEROBIC Blood Culture results may not be optimal due to an inadequate volume of blood received in culture bottles   Culture   Final    NO GROWTH 2 DAYS Performed at [facility], [address] [place], [place] [address]    Report Status PENDING  Incomplete  Blood Culture (routine x 2)     Status: None (Preliminary result)   Collection Time: 07/15/24  2:43 PM   Specimen: BLOOD  Result Value Ref Range Status   Specimen Description BLOOD BLOOD LEFT ARM  Final   Special Requests   Final    BOTTLES DRAWN AEROBIC AND ANAEROBIC Blood Culture results may not be optimal due to an inadequate volume of blood received in culture bottles   Culture   Final    NO GROWTH 2 DAYS Performed at [facility], [address] [place], [place]  [address]    Report Status PENDING  Incomplete    Labs: CBC: Recent Labs  Lab 07/12/24 1421 07/15/24 1327 07/16/24 0319  WBC 13.4* 27.2* 10.6*  HGB 13.5 12.8* 11.2*  HCT 42.2 41.9 36.5*  MCV 90.8 94.2 94.6  PLT 288 258 223   Basic Metabolic Panel: Recent Labs  Lab 07/15/24 1327 07/16/24 0319  NA 137 142  K 4.3 4.3  CL 98 107  CO2 27 28  GLUCOSE 174* 165*  BUN 22 23  CREATININE 1.30* 1.32*  CALCIUM  8.9 8.5*   Liver Function Tests: No results for input(s): AST, ALT, ALKPHOS, BILITOT, PROT, ALBUMIN  in the last 168 hours. CBG: Recent Labs  Lab 07/16/24 0834 07/16/24 1218 07/16/24 1639 07/16/24 2034 07/17/24 0743  GLUCAP 129* 171* 178* 123* 102*    Discharge time spent: greater than 30 minutes.  Signed: Leita Blanch, MD [facility] 07/17/2024

## 2024-07-17 NOTE — Plan of Care (Signed)

## 2024-07-21 LAB — CULTURE, BLOOD (ROUTINE X 2)
Culture: NO GROWTH
Culture: NO GROWTH

## 2024-09-17 ENCOUNTER — Ambulatory Visit
Admission: RE | Admit: 2024-09-17 | Discharge: 2024-09-17 | Disposition: A | Discharge status: Home / Self Care | Source: Ambulatory Visit | Attending: Oncology | Admitting: Oncology

## 2024-09-17 DIAGNOSIS — Z85118 Personal history of other malignant neoplasm of bronchus and lung: Secondary | ICD-10-CM | POA: Diagnosis present

## 2024-09-17 DIAGNOSIS — D508 Other iron deficiency anemias: Secondary | ICD-10-CM | POA: Diagnosis present

## 2024-09-17 DIAGNOSIS — Z08 Encounter for follow-up examination after completed treatment for malignant neoplasm: Secondary | ICD-10-CM | POA: Insufficient documentation

## 2024-10-01 ENCOUNTER — Other Ambulatory Visit

## 2024-10-01 ENCOUNTER — Inpatient Hospital Stay: Admitting: Oncology

## 2024-10-01 ENCOUNTER — Inpatient Hospital Stay: Attending: Oncology

## 2024-10-01 ENCOUNTER — Ambulatory Visit: Admitting: Oncology

## 2024-10-01 ENCOUNTER — Encounter: Payer: Self-pay | Admitting: Oncology

## 2024-10-01 VITALS — BP 110/71 | HR 94 | Temp 97.8°F | Resp 17 | Wt 168.9 lb

## 2024-10-01 DIAGNOSIS — Z85118 Personal history of other malignant neoplasm of bronchus and lung: Secondary | ICD-10-CM | POA: Diagnosis not present

## 2024-10-01 DIAGNOSIS — D508 Other iron deficiency anemias: Secondary | ICD-10-CM | POA: Insufficient documentation

## 2024-10-01 DIAGNOSIS — Z87891 Personal history of nicotine dependence: Secondary | ICD-10-CM | POA: Insufficient documentation

## 2024-10-01 DIAGNOSIS — Z08 Encounter for follow-up examination after completed treatment for malignant neoplasm: Secondary | ICD-10-CM

## 2024-10-01 DIAGNOSIS — C3412 Malignant neoplasm of upper lobe, left bronchus or lung: Secondary | ICD-10-CM

## 2024-10-01 LAB — CBC (CANCER CENTER ONLY)
HCT: 41.9 % (ref 39.0–52.0)
Hemoglobin: 12.7 g/dL — ABNORMAL LOW (ref 13.0–17.0)
MCH: 28.3 pg (ref 26.0–34.0)
MCHC: 30.3 g/dL (ref 30.0–36.0)
MCV: 93.3 fL (ref 80.0–100.0)
Platelet Count: 296 K/uL (ref 150–400)
RBC: 4.49 MIL/uL (ref 4.22–5.81)
RDW: 14.8 % (ref 11.5–15.5)
WBC Count: 10.3 K/uL (ref 4.0–10.5)
nRBC: 0 % (ref 0.0–0.2)

## 2024-10-01 LAB — FERRITIN: Ferritin: 82 ng/mL (ref 24–336)

## 2024-10-01 LAB — IRON AND TIBC
Iron: 38 ug/dL — ABNORMAL LOW (ref 45–182)
Saturation Ratios: 13 % — ABNORMAL LOW (ref 17.9–39.5)
TIBC: 297 ug/dL (ref 250–450)
UIBC: 259 ug/dL

## 2024-10-03 ENCOUNTER — Encounter: Payer: Self-pay | Admitting: Oncology

## 2024-10-03 NOTE — Progress Notes (Signed)
 Hematology/Oncology Consult note [facility]  [phone] [phone]  Patient Care Team: Lora Odor, FNP as PCP - General (Family Medicine) Verdene Gills, RN as Oncology Nurse Navigator Lenn Aran, MD as Referring Physician (Radiation Oncology) Melanee Annah BROCKS, MD as Consulting Physician (Hematology and Oncology)   Name of the patient: Courtland Reas  [id number]  1956/11/03   Date of visit: 10/03/24  Diagnosis- stage I lung cancer s/p radiation Normocytic anemia with component of iron  deficiency  Chief complaint/ Reason for visit- routine f/u of lung cancer and iron  deficiency anemia  Heme/Onc history: Patient is a 68 year old male who was admitted to the hospital in April 2021 for community-acquired pneumonia and as a part of the work-up he had a CT angio chest done which incidentally showed a 1 cm irregular left upper lobe lung nodule repeat CT scan in August 2021 showed an interval increase in the size from 1 to 1.4 cm.  No appreciable thoracic adenopathy.  Severe centrilobular emphysema.  Chronic pulmonary hypertension.  Patient has been seen by Dr. Theotis and has been considered to be high risk for biopsy from their standpoint.  He underwent SBRT to the left upper lobe lung lesion.   Labs from September 2021 showed a normal liver and kidney function.  TSH was normal.  Vitamin D  levels were low.  CBC showed white count of 12.6, H&H of 10.9/37 with an MCV of 102 and a platelet count of 358.   Anemia work-up from 10/27/2020 showed a mildly low ferritin level of 28 with an iron  saturation of 8% myeloma panel, serum free light chains, haptoglobin, TSH and LDH was normal.  B12 levels were low at 166.He has received IV iron  in the past  Interval history- Discussed the use of AI scribe software for clinical note transcription with the patient, who gave verbal consent to proceed.  Lyndel Sarate is a 68 year old male with lung  cancer and anemia who presents for surveillance and management of his conditions.  He is undergoing surveillance for lung cancer. A CT scan on September 17, 2024, showed no change in his lung cancer. He previously received radiation therapy for a spot in his left upper lobe, which is still present but has not grown. There are also some lymph nodes between his lungs that have remained unchanged. He has stage four emphysema.  He is also being monitored for anemia. His hemoglobin level is currently 12.7, which is slightly below the normal range of 13 to 15. His hemoglobin levels have been stable between 12 to 13 over the past few months. Prior to receiving iron  treatment seven months ago, his levels were in the 8 to 9 range. He is awaiting iron  level results. He is not currently on any B12 injections, and his last B12 level in February was 744.   He has baseline fatigue and sob. No new complaints today   ' History of Present Illness  ECOG PS- 3 Pain scale- 3   Review of systems- Review of Systems  Constitutional:  Positive for malaise/fatigue. Negative for chills, fever and weight loss.  HENT:  Negative for congestion, ear discharge and nosebleeds.   Eyes:  Negative for blurred vision.  Respiratory:  Positive for shortness of breath. Negative for cough, hemoptysis, sputum production and wheezing.   Cardiovascular:  Negative for chest pain, palpitations, orthopnea and claudication.  Gastrointestinal:  Negative for abdominal pain, blood in stool, constipation, diarrhea, heartburn, melena, nausea and vomiting.  Genitourinary:  Negative for dysuria, flank pain, frequency, hematuria and urgency.  Musculoskeletal:  Negative for back pain, joint pain and myalgias.  Skin:  Negative for rash.  Neurological:  Negative for dizziness, tingling, focal weakness, seizures, weakness and headaches.  Endo/Heme/Allergies:  Does not bruise/bleed easily.  Psychiatric/Behavioral:  Negative for depression and suicidal  ideas. The patient does not have insomnia.       Allergies  Allergen Reactions   Bee Venom Anaphylaxis   Linezolid  Other (See Comments)    Thrombocytopenia, hyperlactatemia   Azithromycin  Itching and Rash     Past Medical History:  Diagnosis Date   Anemia    Anxiety    Arthritis    Asthma    Cancer (HCC)    Basal Cell Skin Cancer   Chronic back pain    COPD (chronic obstructive pulmonary disease) (HCC)    Depression    Diabetes mellitus (HCC)    Dyspnea    GERD (gastroesophageal reflux disease)    Gout    Gout    Headache    History of blood clots    Left Leg--July 2018   History of kidney stones    Hyperlipidemia    Hyperlipidemia    Hypertension    Kidney stones    Neuropathy    On home oxygen  therapy    2 L / M   Pneumonia 06/2017   Sleep apnea    Ulcer of foot (HCC)    Right     Past Surgical History:  Procedure Laterality Date   APPENDECTOMY     DG FEET 2 VIEWS BILAT     IRRIGATION AND DEBRIDEMENT FOOT Left 07/11/2023   Procedure: IRRIGATION AND DEBRIDEMENT FOOT;  Surgeon: Ashley Soulier, DPM;  Location: [facility] ORS;  Service: Orthopedics/Podiatry;  Laterality: Left;   LIPOMA EXCISION Right 08/15/2017   Procedure: EXCISION TUMOR(CYST) FOOT;  Surgeon: Lilli Cough, DPM;  Location: [facility] ORS;  Service: Podiatry;  Laterality: Right;   LOWER EXTREMITY ANGIOGRAPHY Left 07/03/2023   Procedure: Lower Extremity Angiography;  Surgeon: Marea Selinda RAMAN, MD;  Location: [facility];  Service: Cardiovascular;  Laterality: Left;   OTHER SURGICAL HISTORY Bilateral Foot surgery    Social History   Socioeconomic History   Marital status: Widowed    Spouse name: Not on file   Number of children: Not on file   Years of education: Not on file   Highest education level: Not on file  Occupational History   Not on file  Tobacco Use   Smoking status: Former    Current packs/day: 0.50    Average packs/day: 0.5 packs/day for 7.8 years (3.9 ttl pk-yrs)    Types:  Cigarettes    Start date: 2018   Smokeless tobacco: Never  Vaping Use   Vaping status: Never Used  Substance and Sexual Activity   Alcohol use: Yes    Alcohol/week: 1.0 standard drink of alcohol    Types: 1 Cans of beer per week    Comment: occ   Drug use: No   Sexual activity: Not Currently  Other Topics Concern   Not on file  Social History Narrative   Not on file   Social Drivers of Health   Financial Resource Strain: Low Risk  (02/05/2024)   Received from [facility]   Overall Financial Resource Strain (CARDIA)    Difficulty of Paying Living Expenses: Not hard at all  Food Insecurity: No Food Insecurity (07/15/2024)   Hunger Vital Sign  Worried About Programme Researcher, Broadcasting/film/video in the Last Year: Never true    Ran Out of Food in the Last Year: Never true  Transportation Needs: No Transportation Needs (07/15/2024)   PRAPARE - Administrator, Civil Service (Medical): No    Lack of Transportation (Non-Medical): No  Physical Activity: Not on file  Stress: Not on file  Social Connections: Socially Isolated (07/15/2024)   Social Connection and Isolation Panel    Frequency of Communication with Friends and Family: More than three times a week    Frequency of Social Gatherings with Friends and Family: More than three times a week    Attends Religious Services: Never    Database Administrator or Organizations: No    Attends Banker Meetings: Never    Marital Status: Widowed  Intimate Partner Violence: Not At Risk (07/15/2024)   Humiliation, Afraid, Rape, and Kick questionnaire    Fear of Current or Ex-Partner: No    Emotionally Abused: No    Physically Abused: No    Sexually Abused: No    Family History  Problem Relation Age of Onset   Hypertension Mother      Current Outpatient Medications:    acetaminophen  (TYLENOL ) 325 MG tablet, Take 650 mg by mouth every 6 (six) hours as needed., Disp: , Rfl:    albuterol  (VENTOLIN  HFA) 108 (90 Base)  MCG/ACT inhaler, Inhale 2 puffs into the lungs every 4 (four) hours as needed for wheezing or shortness of breath., Disp: , Rfl:    allopurinol  (ZYLOPRIM ) 300 MG tablet, Take 600 mg by mouth daily., Disp: , Rfl:    Amino Acids-Protein Hydrolys (PRO-STAT SUGAR FREE PO), Take 30 mLs by mouth 2 (two) times daily., Disp: , Rfl:    aspirin  EC 81 MG tablet, Take 1 tablet by mouth daily., Disp: , Rfl:    azelastine  (ASTELIN ) 0.1 % nasal spray, Place 2 sprays into both nostrils 2 (two) times daily as needed., Disp: , Rfl:    bisacodyl  (DULCOLAX) 5 MG EC tablet, Take 1 tablet (5 mg total) by mouth daily as needed for moderate constipation., Disp: 25 tablet, Rfl: 0   bismuth  subsalicylate (STOMACH RELIEF) 262 MG/15ML suspension, Take 15 mLs by mouth every 6 (six) hours as needed., Disp: , Rfl:    Buprenorphine  HCl-Naloxone  HCl 8-2 MG FILM, Place 1 Film under the tongue in the morning, at noon, and at bedtime., Disp: , Rfl:    clopidogrel  (PLAVIX ) 75 MG tablet, Take 1 tablet (75 mg total) by mouth daily., Disp: 30 tablet, Rfl: 1   dapagliflozin  propanediol (FARXIGA ) 10 MG TABS tablet, Take 1 tablet by mouth daily., Disp: , Rfl:    Dextromethorphan-guaiFENesin  10-100 MG/5ML liquid, Take 5 mLs by mouth every 4 (four) hours as needed (cough)., Disp: , Rfl:    DULoxetine  (CYMBALTA ) 60 MG capsule, Take 60 mg by mouth daily., Disp: , Rfl:    ezetimibe  (ZETIA ) 10 MG tablet, Take 10 mg by mouth daily., Disp: , Rfl:    fluticasone  (FLONASE ) 50 MCG/ACT nasal spray, Place 1 spray into both nostrils daily as needed for allergies., Disp: , Rfl:    Fluticasone -Umeclidin-Vilant (TRELEGY ELLIPTA) 100-62.5-25 MCG/ACT AEPB, Inhale 1 puff into the lungs in the morning., Disp: , Rfl:    furosemide  (LASIX ) 20 MG tablet, Take 1 tablet (20 mg total) by mouth daily., Disp: 30 tablet, Rfl: 0   gabapentin  (NEURONTIN ) 800 MG tablet, Take 800 mg by mouth 4 (four) times daily., Disp: ,  Rfl:    ipratropium-albuterol  (DUONEB) 0.5-2.5 (3)  MG/3ML SOLN, Take 3 mLs by nebulization every 6 (six) hours as needed (wheezing)., Disp: , Rfl:    metFORMIN  (GLUCOPHAGE ) 1000 MG tablet, Take 1,000 mg by mouth daily., Disp: , Rfl:    metoprolol  succinate (TOPROL -XL) 50 MG 24 hr tablet, Take 50 mg by mouth daily., Disp: , Rfl:    Multiple Vitamins-Minerals (MULTIVITAMIN ADULT, MINERALS, PO), Take 1 tablet by mouth every morning., Disp: , Rfl:    naloxone  (NARCAN ) nasal spray 4 mg/0.1 mL, Place 1 spray into the nose once., Disp: , Rfl:    OHTUVAYRE  3 MG/2.5ML SUSP, Take 5 mLs by mouth daily., Disp: , Rfl:    ondansetron  (ZOFRAN -ODT) 4 MG disintegrating tablet, Take 4 mg by mouth every 8 (eight) hours as needed for nausea., Disp: , Rfl:    OXYGEN , Inhale 3 L into the lungs., Disp: , Rfl:    pantoprazole  (PROTONIX ) 40 MG tablet, Take 40 mg by mouth daily as needed., Disp: , Rfl:    roflumilast  (DALIRESP ) 500 MCG TABS tablet, Take 500 mcg by mouth daily., Disp: , Rfl:    simvastatin  (ZOCOR ) 40 MG tablet, Take 40 mg by mouth at bedtime., Disp: , Rfl:    Syringe/Needle, Disp, (SYRINGE 3CC/25GX1) 25G X 1 3 ML MISC, 1 Syringe by Does not apply route every 30 (thirty) days., Disp: 12 each, Rfl: 0   traZODone  (DESYREL ) 50 MG tablet, Take 50 mg by mouth at bedtime as needed for sleep., Disp: , Rfl:   Physical exam:  Vitals:   10/01/24 1510  BP: 110/71  Pulse: 94  Resp: 17  Temp: 97.8 F (36.6 C)  SpO2: 92%  Weight: 168 lb 14.4 oz (76.6 kg)   Physical Exam Constitutional:      Comments: Sitting in a wheelchair. On home O2  Cardiovascular:     Rate and Rhythm: Normal rate and regular rhythm.     Heart sounds: Normal heart sounds.  Pulmonary:     Effort: Pulmonary effort is normal.     Breath sounds: Normal breath sounds.  Skin:    General: Skin is warm and dry.  Neurological:     Mental Status: He is alert and oriented to person, place, and time.      I have personally reviewed labs listed below:    Latest Ref Rng & Units 07/16/2024     3:19 AM  CMP  Glucose 70 - 99 mg/dL 834   BUN 8 - 23 mg/dL 23   Creatinine 9.38 - 1.24 mg/dL 8.67   Sodium 864 - 854 mmol/L 142   Potassium 3.5 - 5.1 mmol/L 4.3   Chloride 98 - 111 mmol/L 107   CO2 22 - 32 mmol/L 28   Calcium  8.9 - 10.3 mg/dL 8.5       Latest Ref Rng & Units 10/01/2024    2:55 PM  CBC  WBC 4.0 - 10.5 K/uL 10.3   Hemoglobin 13.0 - 17.0 g/dL 87.2   Hematocrit 60.9 - 52.0 % 41.9   Platelets 150 - 400 K/uL 296    I have personally reviewed Radiology images listed below: No images are attached to the encounter.  CT Chest Wo Contrast Result Date: 09/20/2024 CLINICAL DATA:  Non-small-cell lung cancer restaging * Tracking Code: BO * EXAM: CT CHEST WITHOUT CONTRAST TECHNIQUE: Multidetector CT imaging of the chest was performed following the standard protocol without IV contrast. RADIATION DOSE REDUCTION: This exam was performed according to the departmental dose-optimization  program which includes automated exposure control, adjustment of the mA and/or kV according to patient size and/or use of iterative reconstruction technique. COMPARISON:  07/15/2024 FINDINGS: Cardiovascular: Aortic atherosclerosis. Normal heart size. Left coronary artery calcifications. Gross enlargement of the main pulmonary artery measuring up to 4.1 cm in caliber. No pericardial effusion. Mediastinum/Nodes: Unchanged enlarged right hilar and pretracheal lymph nodes, right hilar nodes measuring up to 2.4 x 1.4 cm (series 2, image 71). Thyroid  gland, trachea, and esophagus demonstrate no significant findings. Lungs/Pleura: Severe emphysema. Unchanged subpleural mass in the peripheral posterior left upper lobe measuring 2.5 x 2.1 cm (series 4, image 44). Interval resolution of consolidative airspace opacity in the right middle lobe (series 4, image 106) similar appearance of irregular opacity throughout the posterior right upper lobe. No pleural effusion or pneumothorax. Upper Abdomen: No acute abnormality.  Musculoskeletal: No chest wall abnormality. No acute osseous findings. IMPRESSION: 1. Unchanged subpleural mass in the peripheral posterior left upper lobe measuring 2.5 x 2.1 cm. 2. Interval resolution of consolidative airspace opacity in the right middle lobe. Similar appearance of irregular opacity throughout the posterior right upper lobe. Findings are consistent with improved infection or inflammation. 3. Unchanged enlarged right hilar and pretracheal lymph nodes. 4. Severe emphysema. 5. Gross enlargement of the main pulmonary artery, as can be seen in pulmonary hypertension. 6. Coronary artery disease. Aortic Atherosclerosis (ICD10-I70.0) and Emphysema (ICD10-J43.9). Electronically Signed   By: Marolyn JONETTA Jaksch M.D.   On: 09/20/2024 21:32     Assessment and plan- Patient is a 68 y.o. male here for surveillance visit for lung cancer and iron  deficiency anemia  Assessment and Plan    Lung cancer, left upper lobe Lung cancer stable post-radiation. CT on October 10 shows no change, likely necrotic tissue. No lymph node progression. No further intervention planned due to emphysema. - Continue surveillance with CT scans.  Emphysema, stage 4 Severe stage 4 emphysema limits further cancer interventions unless necessary.  Anemia Anemia under evaluation. Hemoglobin at 12.7, improved from 8-9. Awaiting iron  studies. B12 adequate at 744 in February. - Check iron  studies and call on Monday if iron  infusions are needed. - Check B12 levels in three months.         Visit Diagnosis 1. Encounter for follow-up surveillance of lung cancer   2. Primary cancer of left upper lobe of lung (HCC)   3. Other iron  deficiency anemia      Dr. Annah Skene, MD, MPH [facility] [phone] 10/03/2024 3:02 PM

## 2024-10-04 ENCOUNTER — Encounter: Payer: Self-pay | Admitting: Oncology

## 2024-10-15 NOTE — ED Provider Notes (Signed)
 [facility] Emergency Department Provider Note   HPI   Christian Fields is a very pleasant 68 y.o. 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 The patient reports ~3 days of shortness of breath on exertion. Of note, the patient is on 6L O2 at home. He is on 4L at present with an SpO2 of 97%. He states that he has been having to increase the amount of oxygen  he uses at home, stating that he was on 4L a few years ago but now is needing 6-8L. At present, the patient states that he feels ok. Per chart review, the patient was discharged ~3 months ago (07/15/24) following a 2-day admission to [facility] for severe sepsis 2/2 R middle lobe pneumonia. He was treated with IV ceftriaxone  and was discharged on PO doxycycline . Of note, the patient notes that he would prefer to go home than be admitted to the hospital today. On further interview he endorses mild cough and chronic back pain. He denies chest pain, fever or sick contacts.   MDM   BP 127/72   Pulse 82   Temp 36.5 C (97.7 F) (Oral)   Resp 18   Wt 75.6 kg (166 lb 11.2 oz)   SpO2 97%   BMI 26.91 kg/m    Hemodynamically stable. On exam, patient is overall well-appearing in no acute distress.  Regular rate and rhythm on 4L of oxygen  with no increased work of breathing, abdomen nondistended, 2+ pulses in distal extremities, no leg swelling. Ddx: This patient presents with dyspnea. Consider acute cardiac etiologies to include ACS, CHF.  Additionally consider acute respiratory etiologies to include acute PE, pneumothorax, asthma, COPD exacerbation, allergic etiologies, or infectious etiologies such as PNA.  Lower suspicion of PE as patient is not tachycardic, on his baseline oxygen .  diagnostic workup as below. Disposition pending further work-up. See progress notes below.   Orders Placed This Encounter  Procedures  . XR Chest 2 views  . CBC w/ Differential  . Comprehensive Metabolic Panel  . PT-INR  . hsTroponin I (serial 0-2-6H w/ delta)  . hsTroponin I - 2 Hour  . hsTroponin I - 6 Hour  . Nutrition Therapy Regular/House  . ECG 12 Lead  . ECG 12 Lead    ED Course as of 10/15/24 2038  [facility] Oct 15, 2024  1842 Creatinine(!): 1.30  1848 hsTroponin I: 9  1849 XR Chest 2 views IMPRESSION:   Hazy opacification in the right middle lobe could represent pneumonia.     1849 hsTroponin I: 9  1849 hsTroponin I: 10 2 hour   1849 CBC without actionable findings  1851 Discussed results of evaluation with the patient today, recommended that he be admitted for further IV antibiotics and observation given his multiple comorbidities he would recommend discharge home, he recognizes that he could potentially get worse, and understands that he should return to the emergency department should he start feeling worse.  I do believe he has capacity make this decision.  Will plan for antibiotics here in the Emergency Department and plan for discharge home.    The case was discussed with the attending physician who is in agreement with the above assessment and plan.  - Any discussion of this patient's case/presentation between myself and consultants, admitting teams, or other team  members has been documented above. - Imaging and other studies: Xrays: See ED Course - External records reviewed: [facility] (07/15/24) - for PMH and HPI - Consideration of admission, observation, transfer, or escalation of care: Yes, however patient was deemed appropriate for outpatient management, please see ED course and MDM for further clinical reasoning.   ED Clinical Impression   Final diagnoses:  Dyspnea on exertion (Primary)  Pneumonia of right lower lobe due to infectious  organism     Physical Exam   Vitals:   10/15/24 1329 10/15/24 1334 10/15/24 1706  BP:  122/73 127/72  Pulse: 82  82  Resp: 18  18  Temp:  36.3 C (97.4 F) 36.5 C (97.7 F)  TempSrc:   Oral  SpO2:  93% 97%  Weight: 75.6 kg (166 lb 11.2 oz)       Constitutional: In no acute distress. Eyes: Conjunctivae are normal. EOMI.  HEENT: Normocephalic and atraumatic. Mucous membranes are moist.  Neck: Full active range of motion Cardiovascular: See heart rate listed above. Normal skin perfusion.   Respiratory: See respiratory rate listed above. Speaking easily in full sentences. On 4L of oxygen  at present. No increased work of breathing.  Gastrointestinal: Soft, non-distended, non-tender.  No guarding. Musculoskeletal: No long bone deformities.  Neurologic: Normal speech and language. No gross focal neurologic deficits are appreciated.  Skin: Skin is warm, dry and intact. Psychiatric: Mood and affect are normal.   Past History   PAST MEDICAL HISTORY/PAST SURGICAL HISTORY:  Past Medical History[1]  Past Surgical History[2]  MEDICATIONS:  No current facility-administered medications for this encounter.  Current Outpatient Medications:  .  acetaminophen  (TYLENOL ) 325 MG tablet, Take 2 tablets (650 mg total) by mouth every six (6) hours as needed for pain., Disp: 50 tablet, Rfl: 0 .  albuterol  (PROVENTIL  HFA;VENTOLIN  HFA) 90 mcg/actuation inhaler, Inhale 2 puffs every four (4) hours as needed for shortness of breath or wheezing., Disp: , Rfl:  .  albuterol  2.5 mg /3 mL (0.083 %) nebulizer solution, Inhale 3 mL (2.5 mg total) by nebulization every four (4) hours as needed for wheezing or shortness of breath., Disp: 300 mL, Rfl: 1 .  allopurinol  (ZYLOPRIM ) 300 MG tablet, TAKE 2 TABLETS BY MOUTH ONCE DAILY, Disp: 90 tablet, Rfl: 1 .  amoxicillin -clavulanate (AUGMENTIN ) 875-125 mg per tablet, Take 1 tablet by mouth two (2) times a day for 10 days., Disp: 20 tablet, Rfl: 0 .  aspirin   (ECOTRIN) 81 MG tablet, Take 1 tablet (81 mg total) by mouth daily., Disp: , Rfl:  .  azelastine  (ASTELIN ) 137 mcg (0.1 %) nasal spray, 2 sprays into each nostril two (2) times a day. (Patient taking differently: 2 sprays into each nostril two (2) times a day. As neded), Disp: , Rfl:  .  buprenorphine -naloxone  (SUBOXONE ) 8-2 mg sublingual film, Place 1 Film (8 mg of buprenorphine  total) under the tongue every eight (8) hours., Disp: , Rfl:  .  clopidogreL  (PLAVIX ) 75 mg tablet, Take 1 tablet (75 mg total) by mouth daily., Disp: , Rfl:  .  cyanocobalamin , vitamin B-12, 1,000 mcg/mL injection, Inject 1 mL (1,000 mcg total) into the muscle every thirty (30) days. (Patient not taking: Reported on 07/26/2024), Disp: , Rfl:  .  dextromethorphan-guaiFENesin  (ROBITUSSIN-DM) 10-100 mg/5 mL liquid, Take 5 mL by mouth every four (4) hours as needed., Disp: , Rfl:  .  doxycycline  (VIBRAMYCIN ) 100 MG capsule, Take 1 capsule (100 mg total) by mouth two (2) times a day  for 10 days., Disp: 20 capsule, Rfl: 0 .  DULoxetine  (CYMBALTA ) 30 MG capsule, Take 1 capsule (30 mg total) by mouth daily. Take with 60 mg capsule , total  daily dose 90 mg, Disp: 90 capsule, Rfl: 3 .  DULoxetine  (CYMBALTA ) 60 MG capsule, Take 1 capsule (60 mg total) by mouth daily. Take with 30 mg capsules, total daily dose 90 mg, Disp: 90 capsule, Rfl: 3 .  ezetimibe  (ZETIA ) 10 mg tablet, Take 1 tablet (10 mg total) by mouth daily., Disp: 90 tablet, Rfl: 3 .  fluticasone -umeclidin-vilanter (TRELEGY ELLIPTA) 100-62.5-25 mcg inhaler, Inhale 1 puff  in the morning., Disp: , Rfl:  .  furosemide  (LASIX ) 20 MG tablet, , Disp: , Rfl:  .  gabapentin  (NEURONTIN ) 800 MG tablet, Take 1 tablet (800 mg total) by mouth four (4) times a day., Disp: 360 tablet, Rfl: 3 .  ipratropium-albuterol  (DUO-NEB) 0.5-2.5 mg/3 mL nebulizer, Inhale 3 mL by nebulization four (4) times a day as needed., Disp: , Rfl:  .  metFORMIN  (GLUCOPHAGE ) 1000 MG tablet, Take 1 tablet (1,000  mg total) by mouth in the morning. Pt reports he is only taking one a day, in the morning., Disp: 100 tablet, Rfl: 3 .  metoPROLOL  succinate (TOPROL -XL) 50 MG 24 hr tablet, Take 1 tablet (50 mg total) by mouth daily., Disp: 100 tablet, Rfl: 3 .  MULTIVITAMIN ORAL, Take 1 tablet by mouth in the morning., Disp: , Rfl:  .  ondansetron  (ZOFRAN -ODT) 4 MG disintegrating tablet, DISSOLVE 1 TABLET ON THE TONGUE EVERY 8 HOURS AS NEEDED FOR NAUSEA, Disp: 30 tablet, Rfl: 1 .  OXYGEN -AIR DELIVERY SYSTEMS MISC, Inhale 2 L continuous. 08/07/23 Pt reports he is on 3L and puts it up when needed.  MGaray, OT/L  Indications: COPD, Disp: , Rfl:  .  pantoprazole  (PROTONIX ) 40 MG tablet, Take 1 tablet (40 mg total) by mouth as needed. daily as needed for heartburn, Disp: , Rfl:  .  roflumilast  (DALIRESP ) 500 mcg Tab, Take 1 tablet (500 mcg total) by mouth daily., Disp: , Rfl:  .  simvastatin  (ZOCOR ) 40 MG tablet, Take 1 tablet (40 mg total) by mouth nightly., Disp: 100 tablet, Rfl: 3 .  syringe,safety 1 mL,disp unit (SYRINGE-NEEDLE, SAFETY,DISP UN MISC), 1 Syringe by Miscellaneous route. (Patient not taking: Reported on 07/26/2024), Disp: , Rfl:  .  traZODone  (DESYREL ) 50 MG tablet, Take 1 tablet (50 mg total) by mouth nightly. (Patient taking differently: Take 1 tablet (50 mg total) by mouth nightly. As needed), Disp: 90 tablet, Rfl: 3  ALLERGIES:  Venom-honey bee, Linezolid , and Azithromycin   SOCIAL HISTORY:  Social History   Tobacco Use  . Smoking status: Former    Current packs/day: 0.00    Average packs/day: 1 pack/day for 40.0 years (40.0 ttl pk-yrs)    Types: Cigarettes    Start date: 09/20/1977    Quit date: 09/20/2017    Years since quitting: 7.0  . Smokeless tobacco: Never  Substance Use Topics  . Alcohol use: Not Currently    Alcohol/week: 35.0 standard drinks of alcohol    Types: 35 Shots of liquor per week    FAMILY HISTORY: Family History[3]    Radiology   XR Chest 2 views  Final Result     Hazy opacification in the right middle lobe could represent pneumonia.             Laboratory Data   Lab Results  Component Value Date   WBC 9.8 10/15/2024   HGB 12.3 (  L) 10/15/2024   HCT 37.8 (L) 10/15/2024   PLT 256 10/15/2024    Lab Results  Component Value Date   NA 142 10/15/2024   K 4.4 10/15/2024   CL 100 10/15/2024   CO2 28.0 10/15/2024   BUN 19 10/15/2024   CREATININE 1.30 (H) 10/15/2024   GLU 106 10/15/2024   CALCIUM  9.7 10/15/2024   MG 1.6 09/26/2023   PHOS 3.8 09/16/2023    Lab Results  Component Value Date   BILITOT 0.2 (L) 10/15/2024   BILIDIR 0.10 07/31/2023   PROT 7.5 10/15/2024   ALBUMIN  3.5 10/15/2024   ALT 16 10/15/2024   AST 15 10/15/2024   ALKPHOS 139 (H) 10/15/2024    Lab Results  Component Value Date   INR 1.02 10/15/2024   APTT 24.1 (L) 07/29/2023    Morene Ates, DO PGY3 EM  Portions of this record have been created using Dragon dictation software. Dictation errors have been sought, but may not have been identified and corrected.   Documentation assistance was provided by [place]  Ivonne, Scribe on October 15, 2024 at 6:15 PM for [place], DO.  Documentation assistance was provided by the scribe in my presence.  The documentation recorded by the scribe has been reviewed by me and accurately reflects the services I personally performed. Edits were made as necessary.  Morene Ates, DO         [1] Past Medical History: Diagnosis Date  . Back pain   . COPD (chronic obstructive pulmonary disease)    (CMS-HCC)   . Diabetes mellitus    (CMS-HCC)   . Hypertension   . Neuropathy   [2] Past Surgical History: Procedure Laterality Date  . APPENDECTOMY    . CATARACT EXTRACTION EXTRACAPSULAR W/ INTRAOCULAR LENS IMPLANTATION Left 11/17/2018  . CATARACT EXTRACTION EXTRACAPSULAR W/ INTRAOCULAR LENS IMPLANTATION Right 10/07/2019  . PR XCAPSL CTRC RMVL INSJ IO LENS PROSTH W/O ECP Left 11/17/2018   Procedure:  EXTRACAPSULAR CATARACT REMOVAL W/INSERTION OF INTRAOCULAR LENS PROSTHESIS, MANUAL OR MECHANICAL TECHNIQUE;  Surgeon: Hoy Donnal Hero, MD;  Location: [facility];  Service: Ophthalmology  . PR XCAPSL CTRC RMVL INSJ IO LENS PROSTH W/O ECP Right 10/07/2019   Procedure: Right eye cataract extraction with intraocular lens insertion;  Surgeon: Alm Danas, MD;  Location: [facility];  Service: Ophthalmology  [3] Family History Problem Relation Age of Onset  . Diabetes Mother   . Cancer Son        LEUKEMIA  . Amblyopia Neg Hx   . Blindness Neg Hx   . Glaucoma Neg Hx   . Macular degeneration Neg Hx   . Retinal detachment Neg Hx   . Strabismus Neg Hx    Ates Morene Maude Sheppard, [place] Resident 10/15/24 2038

## 2024-11-03 ENCOUNTER — Inpatient Hospital Stay
Admission: EM | Admit: 2024-11-03 | Discharge: 2024-11-07 | DRG: 291 | Disposition: A | Discharge status: Home / Self Care | Attending: Internal Medicine | Admitting: Internal Medicine

## 2024-11-03 ENCOUNTER — Encounter: Payer: Self-pay | Admitting: *Deleted

## 2024-11-03 ENCOUNTER — Emergency Department

## 2024-11-03 ENCOUNTER — Other Ambulatory Visit: Payer: Self-pay

## 2024-11-03 DIAGNOSIS — G459 Transient cerebral ischemic attack, unspecified: Secondary | ICD-10-CM | POA: Diagnosis present

## 2024-11-03 DIAGNOSIS — E1122 Type 2 diabetes mellitus with diabetic chronic kidney disease: Secondary | ICD-10-CM | POA: Diagnosis present

## 2024-11-03 DIAGNOSIS — Z87891 Personal history of nicotine dependence: Secondary | ICD-10-CM

## 2024-11-03 DIAGNOSIS — I5043 Acute on chronic combined systolic (congestive) and diastolic (congestive) heart failure: Secondary | ICD-10-CM | POA: Diagnosis present

## 2024-11-03 DIAGNOSIS — Z9981 Dependence on supplemental oxygen: Secondary | ICD-10-CM

## 2024-11-03 DIAGNOSIS — Z1152 Encounter for screening for COVID-19: Secondary | ICD-10-CM

## 2024-11-03 DIAGNOSIS — E1169 Type 2 diabetes mellitus with other specified complication: Secondary | ICD-10-CM

## 2024-11-03 DIAGNOSIS — Z7902 Long term (current) use of antithrombotics/antiplatelets: Secondary | ICD-10-CM

## 2024-11-03 DIAGNOSIS — N1831 Chronic kidney disease, stage 3a: Secondary | ICD-10-CM | POA: Diagnosis present

## 2024-11-03 DIAGNOSIS — J441 Chronic obstructive pulmonary disease with (acute) exacerbation: Principal | ICD-10-CM | POA: Diagnosis present

## 2024-11-03 DIAGNOSIS — E1129 Type 2 diabetes mellitus with other diabetic kidney complication: Secondary | ICD-10-CM | POA: Diagnosis present

## 2024-11-03 DIAGNOSIS — E663 Overweight: Secondary | ICD-10-CM | POA: Diagnosis present

## 2024-11-03 DIAGNOSIS — Z91030 Bee allergy status: Secondary | ICD-10-CM

## 2024-11-03 DIAGNOSIS — Z881 Allergy status to other antibiotic agents status: Secondary | ICD-10-CM

## 2024-11-03 DIAGNOSIS — I10 Essential (primary) hypertension: Secondary | ICD-10-CM | POA: Diagnosis present

## 2024-11-03 DIAGNOSIS — Z8249 Family history of ischemic heart disease and other diseases of the circulatory system: Secondary | ICD-10-CM

## 2024-11-03 DIAGNOSIS — Z79899 Other long term (current) drug therapy: Secondary | ICD-10-CM

## 2024-11-03 DIAGNOSIS — J440 Chronic obstructive pulmonary disease with acute lower respiratory infection: Secondary | ICD-10-CM | POA: Diagnosis present

## 2024-11-03 DIAGNOSIS — G894 Chronic pain syndrome: Secondary | ICD-10-CM | POA: Diagnosis present

## 2024-11-03 DIAGNOSIS — Z85828 Personal history of other malignant neoplasm of skin: Secondary | ICD-10-CM

## 2024-11-03 DIAGNOSIS — Z87442 Personal history of urinary calculi: Secondary | ICD-10-CM

## 2024-11-03 DIAGNOSIS — E876 Hypokalemia: Secondary | ICD-10-CM | POA: Diagnosis not present

## 2024-11-03 DIAGNOSIS — Z7982 Long term (current) use of aspirin: Secondary | ICD-10-CM

## 2024-11-03 DIAGNOSIS — I130 Hypertensive heart and chronic kidney disease with heart failure and stage 1 through stage 4 chronic kidney disease, or unspecified chronic kidney disease: Secondary | ICD-10-CM | POA: Diagnosis present

## 2024-11-03 DIAGNOSIS — Z6826 Body mass index (BMI) 26.0-26.9, adult: Secondary | ICD-10-CM

## 2024-11-03 DIAGNOSIS — M109 Gout, unspecified: Secondary | ICD-10-CM | POA: Diagnosis present

## 2024-11-03 DIAGNOSIS — J9621 Acute and chronic respiratory failure with hypoxia: Principal | ICD-10-CM | POA: Diagnosis present

## 2024-11-03 DIAGNOSIS — J189 Pneumonia, unspecified organism: Secondary | ICD-10-CM | POA: Diagnosis not present

## 2024-11-03 DIAGNOSIS — Z86718 Personal history of other venous thrombosis and embolism: Secondary | ICD-10-CM

## 2024-11-03 DIAGNOSIS — Z8673 Personal history of transient ischemic attack (TIA), and cerebral infarction without residual deficits: Secondary | ICD-10-CM

## 2024-11-03 DIAGNOSIS — E785 Hyperlipidemia, unspecified: Secondary | ICD-10-CM | POA: Diagnosis present

## 2024-11-03 DIAGNOSIS — Z888 Allergy status to other drugs, medicaments and biological substances status: Secondary | ICD-10-CM

## 2024-11-03 DIAGNOSIS — Z7984 Long term (current) use of oral hypoglycemic drugs: Secondary | ICD-10-CM

## 2024-11-03 LAB — BASIC METABOLIC PANEL WITH GFR
Anion gap: 13 (ref 5–15)
BUN: 29 mg/dL — ABNORMAL HIGH (ref 8–23)
CO2: 25 mmol/L (ref 22–32)
Calcium: 8.8 mg/dL — ABNORMAL LOW (ref 8.9–10.3)
Chloride: 99 mmol/L (ref 98–111)
Creatinine, Ser: 1.82 mg/dL — ABNORMAL HIGH (ref 0.61–1.24)
GFR, Estimated: 40 mL/min — ABNORMAL LOW (ref >60)
Glucose, Bld: 153 mg/dL — ABNORMAL HIGH (ref 70–99)
Potassium: 4.3 mmol/L (ref 3.5–5.1)
Sodium: 137 mmol/L (ref 135–145)

## 2024-11-03 LAB — BLOOD GAS, VENOUS
Acid-base deficit: 1.4 mmol/L (ref 0.0–2.0)
Bicarbonate: 26.9 mmol/L (ref 20.0–28.0)
O2 Saturation: 77.6 %
Patient temperature: 37
pCO2, Ven: 60 mmHg (ref 44–60)
pH, Ven: 7.26 (ref 7.25–7.43)
pO2, Ven: 47 mmHg — ABNORMAL HIGH (ref 32–45)

## 2024-11-03 LAB — RESP PANEL BY RT-PCR (RSV, FLU A&B, COVID)  RVPGX2
Influenza A by PCR: NEGATIVE
Influenza B by PCR: NEGATIVE
Resp Syncytial Virus by PCR: NEGATIVE
SARS Coronavirus 2 by RT PCR: NEGATIVE

## 2024-11-03 LAB — CBC
HCT: 37.8 % — ABNORMAL LOW (ref 39.0–52.0)
Hemoglobin: 11.2 g/dL — ABNORMAL LOW (ref 13.0–17.0)
MCH: 28.1 pg (ref 26.0–34.0)
MCHC: 29.6 g/dL — ABNORMAL LOW (ref 30.0–36.0)
MCV: 95 fL (ref 80.0–100.0)
Platelets: 196 K/uL (ref 150–400)
RBC: 3.98 MIL/uL — ABNORMAL LOW (ref 4.22–5.81)
RDW: 15.3 % (ref 11.5–15.5)
WBC: 8.6 K/uL (ref 4.0–10.5)
nRBC: 0 % (ref 0.0–0.2)

## 2024-11-03 LAB — PRO BRAIN NATRIURETIC PEPTIDE: Pro Brain Natriuretic Peptide: 5179 pg/mL — ABNORMAL HIGH (ref <300.0)

## 2024-11-03 LAB — CBG MONITORING, ED: Glucose-Capillary: 111 mg/dL — ABNORMAL HIGH (ref 70–99)

## 2024-11-03 MED ORDER — IPRATROPIUM-ALBUTEROL 0.5-2.5 (3) MG/3ML IN SOLN
3.0000 mL | Freq: Once | RESPIRATORY_TRACT | Status: AC
  Administered 2024-11-03: 3 mL via RESPIRATORY_TRACT
  Filled 2024-11-03: qty 3

## 2024-11-03 MED ORDER — SIMVASTATIN 20 MG PO TABS
40.0000 mg | ORAL_TABLET | Freq: Every day | ORAL | Status: DC
  Administered 2024-11-04 – 2024-11-06 (×3): 40 mg via ORAL
  Filled 2024-11-03 (×3): qty 2

## 2024-11-03 MED ORDER — SODIUM CHLORIDE 0.9 % IV BOLUS
500.0000 mL | Freq: Once | INTRAVENOUS | Status: AC
  Administered 2024-11-03: 500 mL via INTRAVENOUS

## 2024-11-03 MED ORDER — HYDRALAZINE HCL 20 MG/ML IJ SOLN: 5.0000 mg | INTRAMUSCULAR | Status: DC | PRN

## 2024-11-03 MED ORDER — BUPRENORPHINE HCL-NALOXONE HCL 8-2 MG SL SUBL
1.0000 | SUBLINGUAL_TABLET | Freq: Three times a day (TID) | SUBLINGUAL | Status: DC
  Administered 2024-11-03 – 2024-11-07 (×11): 1 via SUBLINGUAL
  Filled 2024-11-03 (×11): qty 1

## 2024-11-03 MED ORDER — SODIUM CHLORIDE 0.9 % IV SOLN
100.0000 mg | Freq: Once | INTRAVENOUS | Status: DC
  Filled 2024-11-03: qty 100

## 2024-11-03 MED ORDER — TRAZODONE HCL 50 MG PO TABS
50.0000 mg | ORAL_TABLET | Freq: Every evening | ORAL | Status: DC | PRN
  Filled 2024-11-03: qty 1

## 2024-11-03 MED ORDER — DULOXETINE HCL 30 MG PO CPEP
60.0000 mg | ORAL_CAPSULE | Freq: Every day | ORAL | Status: DC
  Administered 2024-11-04 – 2024-11-07 (×4): 60 mg via ORAL
  Filled 2024-11-03 (×2): qty 2
  Filled 2024-11-03: qty 1
  Filled 2024-11-03: qty 2

## 2024-11-03 MED ORDER — SODIUM CHLORIDE 0.9 % IV SOLN
1.0000 g | Freq: Once | INTRAVENOUS | Status: AC
  Administered 2024-11-03: 1 g via INTRAVENOUS
  Filled 2024-11-03: qty 10

## 2024-11-03 MED ORDER — FUROSEMIDE 10 MG/ML IJ SOLN
40.0000 mg | Freq: Two times a day (BID) | INTRAMUSCULAR | Status: DC
  Administered 2024-11-03 – 2024-11-06 (×6): 40 mg via INTRAVENOUS
  Filled 2024-11-03 (×6): qty 4

## 2024-11-03 MED ORDER — SODIUM CHLORIDE 0.9 % IV SOLN
100.0000 mg | Freq: Two times a day (BID) | INTRAVENOUS | Status: DC
  Administered 2024-11-04 – 2024-11-07 (×7): 100 mg via INTRAVENOUS
  Filled 2024-11-03 (×8): qty 100

## 2024-11-03 MED ORDER — IPRATROPIUM-ALBUTEROL 0.5-2.5 (3) MG/3ML IN SOLN
3.0000 mL | RESPIRATORY_TRACT | Status: DC
  Administered 2024-11-03 – 2024-11-05 (×8): 3 mL via RESPIRATORY_TRACT
  Filled 2024-11-03 (×9): qty 3

## 2024-11-03 MED ORDER — EZETIMIBE 10 MG PO TABS
10.0000 mg | ORAL_TABLET | Freq: Every day | ORAL | Status: DC
  Administered 2024-11-04 – 2024-11-07 (×4): 10 mg via ORAL
  Filled 2024-11-03 (×4): qty 1

## 2024-11-03 MED ORDER — METOPROLOL SUCCINATE ER 50 MG PO TB24
50.0000 mg | ORAL_TABLET | Freq: Every day | ORAL | Status: DC
  Administered 2024-11-04 – 2024-11-07 (×4): 50 mg via ORAL
  Filled 2024-11-03 (×4): qty 1

## 2024-11-03 MED ORDER — INSULIN ASPART 100 UNIT/ML IJ SOLN
0.0000 [IU] | Freq: Every day | INTRAMUSCULAR | Status: DC
  Administered 2024-11-04 – 2024-11-05 (×2): 3 [IU] via SUBCUTANEOUS
  Administered 2024-11-06: 5 [IU] via SUBCUTANEOUS
  Filled 2024-11-03: qty 5
  Filled 2024-11-03 (×2): qty 3

## 2024-11-03 MED ORDER — ASPIRIN 81 MG PO TBEC
81.0000 mg | DELAYED_RELEASE_TABLET | Freq: Every day | ORAL | Status: DC
  Administered 2024-11-04 – 2024-11-07 (×4): 81 mg via ORAL
  Filled 2024-11-03 (×4): qty 1

## 2024-11-03 MED ORDER — ACETAMINOPHEN 325 MG PO TABS: 650.0000 mg | ORAL_TABLET | Freq: Four times a day (QID) | ORAL | Status: DC | PRN

## 2024-11-03 MED ORDER — METHYLPREDNISOLONE SODIUM SUCC 125 MG IJ SOLR
125.0000 mg | Freq: Once | INTRAMUSCULAR | Status: AC
  Administered 2024-11-03: 125 mg via INTRAVENOUS
  Filled 2024-11-03: qty 2

## 2024-11-03 MED ORDER — GABAPENTIN 400 MG PO CAPS
800.0000 mg | ORAL_CAPSULE | Freq: Four times a day (QID) | ORAL | Status: DC
  Administered 2024-11-03 – 2024-11-07 (×14): 800 mg via ORAL
  Filled 2024-11-03 (×2): qty 2
  Filled 2024-11-03: qty 8
  Filled 2024-11-03 (×4): qty 2
  Filled 2024-11-03: qty 8
  Filled 2024-11-03 (×9): qty 2

## 2024-11-03 MED ORDER — METHYLPREDNISOLONE NA SUC (PF) 125 MG IJ SOLR
80.0000 mg | Freq: Once | INTRAMUSCULAR | Status: DC
  Filled 2024-11-03 (×2): qty 1.28

## 2024-11-03 MED ORDER — INSULIN ASPART 100 UNIT/ML IJ SOLN
0.0000 [IU] | Freq: Three times a day (TID) | INTRAMUSCULAR | Status: DC
  Administered 2024-11-04 (×2): 5 [IU] via SUBCUTANEOUS
  Administered 2024-11-04: 9 [IU] via SUBCUTANEOUS
  Administered 2024-11-05: 3 [IU] via SUBCUTANEOUS
  Administered 2024-11-05: 5 [IU] via SUBCUTANEOUS
  Administered 2024-11-05: 7 [IU] via SUBCUTANEOUS
  Administered 2024-11-06: 3 [IU] via SUBCUTANEOUS
  Administered 2024-11-06: 7 [IU] via SUBCUTANEOUS
  Administered 2024-11-07: 5 [IU] via SUBCUTANEOUS
  Filled 2024-11-03 (×3): qty 5
  Filled 2024-11-03: qty 7
  Filled 2024-11-03: qty 3
  Filled 2024-11-03: qty 5
  Filled 2024-11-03 (×2): qty 7
  Filled 2024-11-03: qty 9

## 2024-11-03 MED ORDER — ALLOPURINOL 100 MG PO TABS
600.0000 mg | ORAL_TABLET | Freq: Every day | ORAL | Status: DC
  Administered 2024-11-04 – 2024-11-07 (×4): 600 mg via ORAL
  Filled 2024-11-03: qty 2
  Filled 2024-11-03 (×3): qty 6

## 2024-11-03 MED ORDER — ROFLUMILAST 500 MCG PO TABS
500.0000 ug | ORAL_TABLET | Freq: Every day | ORAL | Status: DC
  Administered 2024-11-04 – 2024-11-07 (×4): 500 ug via ORAL
  Filled 2024-11-03 (×4): qty 1

## 2024-11-03 MED ORDER — ALBUTEROL SULFATE (2.5 MG/3ML) 0.083% IN NEBU: 2.5000 mg | INHALATION_SOLUTION | RESPIRATORY_TRACT | Status: DC | PRN

## 2024-11-03 MED ORDER — ENOXAPARIN SODIUM 40 MG/0.4ML IJ SOSY
40.0000 mg | PREFILLED_SYRINGE | INTRAMUSCULAR | Status: DC
  Administered 2024-11-03 – 2024-11-06 (×4): 40 mg via SUBCUTANEOUS
  Filled 2024-11-03 (×4): qty 0.4

## 2024-11-03 MED ORDER — ADULT MULTIVITAMIN W/MINERALS CH
ORAL_TABLET | Freq: Every morning | ORAL | Status: DC
  Administered 2024-11-04 – 2024-11-06 (×2): 1 via ORAL
  Filled 2024-11-03 (×4): qty 1

## 2024-11-03 MED ORDER — SODIUM CHLORIDE 0.9 % IV SOLN
1.0000 g | INTRAVENOUS | Status: DC
  Administered 2024-11-04 – 2024-11-06 (×3): 1 g via INTRAVENOUS
  Filled 2024-11-03 (×5): qty 10

## 2024-11-03 MED ORDER — DM-GUAIFENESIN ER 30-600 MG PO TB12: 1.0000 | ORAL_TABLET | Freq: Two times a day (BID) | ORAL | Status: DC | PRN

## 2024-11-03 MED ORDER — CLOPIDOGREL BISULFATE 75 MG PO TABS
75.0000 mg | ORAL_TABLET | Freq: Every day | ORAL | Status: DC
  Administered 2024-11-04 – 2024-11-07 (×4): 75 mg via ORAL
  Filled 2024-11-03 (×4): qty 1

## 2024-11-03 MED ORDER — METHYLPREDNISOLONE SODIUM SUCC 125 MG IJ SOLR
80.0000 mg | Freq: Every day | INTRAMUSCULAR | Status: DC
  Administered 2024-11-04 – 2024-11-06 (×3): 80 mg via INTRAVENOUS
  Filled 2024-11-03 (×3): qty 2

## 2024-11-03 MED ORDER — ONDANSETRON HCL 4 MG/2ML IJ SOLN: 4.0000 mg | Freq: Three times a day (TID) | INTRAMUSCULAR | Status: DC | PRN

## 2024-11-03 NOTE — ED Notes (Signed)
 Respiratory at bedside.

## 2024-11-03 NOTE — ED Triage Notes (Signed)
 Pt to triage via wheelchair. Pt is on 4 liters oxygen  but had to put it on 8 liters today.   Pt has sob. No chest pain.   Sx began today.   Hx copd.  Pt  alert  speech clear.

## 2024-11-03 NOTE — H&P (Signed)
 History and Physical    Christian Fields FMW:[id number] DOB: 08-15-1956 DOA: 11/03/2024  Referring MD/NP/PA:   PCP: Lora Odor, FNP   Patient coming from:  The patient is coming from home.     Chief Complaint: SOB  HPI: Christian Fields is a 68 y.o. male with medical history significant of COPD on 4 L oxygen , hypertension, hyperlipidemia, diabetes mellitus, asthma, TIA, GERD, gout, depression, OSA not on CPAP, anemia, AAA, polysubstance abuse, chronic pain syndrome on Suboxone , non-small cell lung cancer (s/p of RXT), CKD-3a, who presents with SOB.  Pt states that he was recently treated for pneumonia as outpatient with antibiotics (?  Cefdinir and the doxycycline  per her daughter).  He feels better, but he developed worsening shortness breath since this morning, cough with little thick mucus production, no chest pain, fever or chills.  No nausea, vomiting, diarrhea or abdominal pain.  No symptoms of UTI.   Patient normally uses 4 L oxygen  at baseline, found to have severe respiratory distress, cannot speak in full sentence, oxygen  desaturated to 80s per her daughter-in-law at the bedside, initially started 8 L of oxygen , still has respiratory distress, then started high flow nasal cannula oxygen  in ED  Data reviewed independently and ED Course: pt was found to have WBC 8.6, worsening renal function, temperature normal, blood pressure 117/66, heart rate of 104, RR 28.  VBG with pH 7.26, CO2 60, O2 47.  Chest x-ray showed infiltration in right middle and right lower lobes.  Patient is admitted to PCU as inpatient.   EKG: I have personally reviewed.  Sinus rhythm, QTc 477, borderline LAD, poor R wave progression, anteroseptal infarction pattern   Review of Systems:   General: no fevers, chills, no body weight gain, has fatigue HEENT: no blurry vision, hearing changes or sore throat Respiratory: has dyspnea, coughing, wheezing CV: no chest pain, no palpitations GI: no nausea,  vomiting, abdominal pain, diarrhea, constipation GU: no dysuria, burning on urination, increased urinary frequency, hematuria  Ext: has trace leg edema Neuro: no unilateral weakness, numbness, or tingling, no vision change or hearing loss Skin: no rash, no skin tear. MSK: No muscle spasm, no deformity, no limitation of range of movement in spin Heme: No easy bruising.  Travel history: No recent long distant travel.   Allergy:  Allergies  Allergen Reactions   Bee Venom Anaphylaxis   Linezolid  Other (See Comments)    Thrombocytopenia, hyperlactatemia   Azithromycin  Itching and Rash    Past Medical History:  Diagnosis Date   Anemia    Anxiety    Arthritis    Asthma    Cancer (HCC)    Basal Cell Skin Cancer   Chronic back pain    COPD (chronic obstructive pulmonary disease) (HCC)    Depression    Diabetes mellitus (HCC)    Dyspnea    GERD (gastroesophageal reflux disease)    Gout    Gout    Headache    History of blood clots    Left Leg--July 2018   History of kidney stones    Hyperlipidemia    Hyperlipidemia    Hypertension    Kidney stones    Neuropathy    On home oxygen  therapy    2 L / M   Pneumonia 06/2017   Sleep apnea    Ulcer of foot (HCC)    Right    Past Surgical History:  Procedure Laterality Date   APPENDECTOMY     DG FEET 2 VIEWS BILAT  IRRIGATION AND DEBRIDEMENT FOOT Left 07/11/2023   Procedure: IRRIGATION AND DEBRIDEMENT FOOT;  Surgeon: Ashley Soulier, DPM;  Location: [facility] OR[facility];  Service: Orthopedics/Podiatry;  Laterality: Left;   LIPOMA EXCISION Right 08/15/2017   Procedure: EXCISION TUMOR(CYST) FOOT;  Surgeon: Lilli Cough, DPM;  Location: [facility] OR[facility];  Service: Podiatry;  Laterality: Right;   LOWER EXTREMITY ANGIOGRAPHY Left 07/03/2023   Procedure: Lower Extremity Angiography;  Surgeon: Marea Selinda RAMAN, MD;  Location: [facility];  Service: Cardiovascular;  Laterality: Left;   OTHER SURGICAL HISTORY Bilateral Foot surgery    Social  History:  reports that he has quit smoking. His smoking use included cigarettes. He started smoking about 7 years ago. He has a 4 pack-year smoking history. He has never used smokeless tobacco. He reports current alcohol use of about 1.0 standard drink of alcohol per week. He reports that he does not use drugs.  Family History:  Family History  Problem Relation Age of Onset   Hypertension Mother      Prior to Admission medications   Medication Sig Start Date End Date Taking? Authorizing Provider  acetaminophen  (TYLENOL ) 325 MG tablet Take 650 mg by mouth every 6 (six) hours as needed.    [provider]  albuterol  (VENTOLIN  HFA) 108 (90 Base) MCG/ACT inhaler Inhale 2 puffs into the lungs every 4 (four) hours as needed for wheezing or shortness of breath.    [provider]  allopurinol  (ZYLOPRIM ) 300 MG tablet Take 600 mg by mouth daily. 02/24/20   [provider]  Amino Acids-Protein Hydrolys (PRO-STAT SUGAR FREE PO) Take 30 mLs by mouth 2 (two) times daily.    [provider]  aspirin  EC 81 MG tablet Take 1 tablet by mouth daily.    [provider]  azelastine  (ASTELIN ) 0.1 % nasal spray Place 2 sprays into both nostrils 2 (two) times daily as needed. 05/27/23   [provider]  bisacodyl  (DULCOLAX) 5 MG EC tablet Take 1 tablet (5 mg total) by mouth daily as needed for moderate constipation. 09/04/23   Dorinda Drue DASEN, MD  bismuth  subsalicylate (STOMACH RELIEF) 262 MG/15ML suspension Take 15 mLs by mouth every 6 (six) hours as needed.    [provider]  Buprenorphine  HCl-Naloxone  HCl 8-2 MG FILM Place 1 Film under the tongue in the morning, at noon, and at bedtime.    [provider]  clopidogrel  (PLAVIX ) 75 MG tablet Take 1 tablet (75 mg total) by mouth daily. 02/02/21   Leotis Bogus, MD  dapagliflozin  propanediol (FARXIGA ) 10 MG TABS tablet Take 1 tablet by mouth daily. 03/16/24 03/16/25  [provider]   Dextromethorphan-guaiFENesin  10-100 MG/5ML liquid Take 5 mLs by mouth every 4 (four) hours as needed (cough). 10/01/23   [provider]  DULoxetine  (CYMBALTA ) 60 MG capsule Take 60 mg by mouth daily.    [provider]  ezetimibe  (ZETIA ) 10 MG tablet Take 10 mg by mouth daily. 02/24/20   [provider]  fluticasone  (FLONASE ) 50 MCG/ACT nasal spray Place 1 spray into both nostrils daily as needed for allergies. 10/16/18   [provider]  Fluticasone -Umeclidin-Vilant (TRELEGY ELLIPTA) 100-62.5-25 MCG/ACT AEPB Inhale 1 puff into the lungs in the morning. 09/12/21   [provider]  furosemide  (LASIX ) 20 MG tablet Take 1 tablet (20 mg total) by mouth daily. 02/23/24   Jens Durand, MD  gabapentin  (NEURONTIN ) 800 MG tablet Take 800 mg by mouth 4 (four) times daily. 10/22/23   [provider]  ipratropium-albuterol  (DUONEB) 0.5-2.5 (3) MG/3ML SOLN Take 3 mLs by nebulization every 6 (six) hours as needed (wheezing).    [provider]  metFORMIN  (GLUCOPHAGE ) 1000 MG tablet Take 1,000 mg by mouth daily.    [provider]  metoprolol  succinate (TOPROL -XL) 50 MG 24 hr tablet Take 50 mg by mouth daily. 02/24/20   [provider]  Multiple Vitamins-Minerals (MULTIVITAMIN ADULT, MINERALS, PO) Take 1 tablet by mouth every morning.    [provider]  naloxone  (NARCAN ) nasal spray 4 mg/0.1 mL Place 1 spray into the nose once.    [provider]  OHTUVAYRE  3 MG/2.5ML SUSP Take 5 mLs by mouth daily. 07/05/24   [provider]  ondansetron  (ZOFRAN -ODT) 4 MG disintegrating tablet Take 4 mg by mouth every 8 (eight) hours as needed for nausea.    [provider]  OXYGEN  Inhale 3 L into the lungs.    [provider]  pantoprazole  (PROTONIX ) 40 MG tablet Take 40 mg by mouth daily as needed. 06/20/21   [provider]  roflumilast  (DALIRESP ) 500 MCG TABS tablet Take 500 mcg by mouth daily.  08/15/21   [provider]  simvastatin  (ZOCOR ) 40 MG tablet Take 40 mg by mouth at bedtime. 01/17/16   [provider]  Syringe/Needle, Disp, (SYRINGE 3CC/25GX1) 25G X 1 3 ML MISC 1 Syringe by Does not apply route every 30 (thirty) days. 07/12/22   Brahmanday, Govinda R, MD  traZODone  (DESYREL ) 50 MG tablet Take 50 mg by mouth at bedtime as needed for sleep. 01/22/24 01/21/25  [provider]    Physical Exam: Vitals:   11/03/24 1829  BP: 117/66  Pulse: (!) 104  Resp: (!) 28  Temp: 98.1 F (36.7 C)  TempSrc: Oral  SpO2: (!) 86%  Weight: 74.8 kg  Height: 5' 6 (1.676 m)   General: Has acute respiratory distress HEENT:       Eyes: PERRL, EOMI, no jaundice       ENT: No discharge from the ears and nose, no pharynx injection, no tonsillar enlargement.        Neck: positive JVD, no bruit, no mass felt. Heme: No neck lymph node enlargement. Cardiac: S1/S2, RRR, No murmurs, No gallops or rubs. Respiratory: Has coarse breathing sound and wheezing bilaterally GI: Soft, nondistended, nontender, no rebound pain, no organomegaly, BS present. GU: No hematuria Ext: has trace leg edema bilaterally. 1+DP/PT pulse bilaterally. Musculoskeletal: No joint deformities, No joint redness or warmth, no limitation of ROM in spin. Skin: No rashes.  Neuro: Alert, oriented X3, cranial nerves II-XII grossly intact, moves all extremities normally.  Psych: Patient is not psychotic, no suicidal or hemocidal ideation.  Labs on Admission: I have personally reviewed following labs and imaging studies  CBC: Recent Labs  Lab 11/03/24 1850  WBC 8.6  HGB 11.2*  HCT 37.8*  MCV 95.0  PLT 196   Basic Metabolic Panel: Recent Labs  Lab 11/03/24 1850  NA 137  K 4.3  CL 99  CO2 25  GLUCOSE 153*  BUN 29*  CREATININE 1.82*  CALCIUM  8.8*   GFR: Estimated Creatinine Clearance: 34.6 mL/min (A) (by C-G formula based on SCr of 1.82 mg/dL (H)). Liver Function Tests: No results for  input(s): AST, ALT, ALKPHOS, BILITOT, PROT, ALBUMIN  in the last 168 hours. No results for input(s): LIPASE, AMYLASE in the last 168 hours. No results for input(s): AMMONIA in the last 168 hours. Coagulation Profile: No results for input(s): INR, PROTIME in the last  168 hours. Cardiac Enzymes: No results for input(s): CKTOTAL, CKMB, CKMBINDEX, TROPONINI in the last 168 hours. BNP (last 3 results) Recent Labs    11/03/24 1850  PROBNP 5,179.0*   HbA1C: No results for input(s): HGBA1C in the last 72 hours. CBG: Recent Labs  Lab 11/03/24 2132  GLUCAP 111*   Lipid Profile: No results for input(s): CHOL, HDL, LDLCALC, TRIG, CHOLHDL, LDLDIRECT in the last 72 hours. Thyroid  Function Tests: No results for input(s): TSH, T4TOTAL, FREET4, T3FREE, THYROIDAB in the last 72 hours. Anemia Panel: No results for input(s): VITAMINB12, FOLATE, FERRITIN, TIBC, IRON , RETICCTPCT in the last 72 hours. Urine analysis:    Component Value Date/Time   COLORURINE YELLOW (A) 07/02/2023 1406   APPEARANCEUR CLEAR (A) 07/02/2023 1406   APPEARANCEUR Clear 03/24/2013 1853   LABSPEC 1.009 07/02/2023 1406   LABSPEC 1.020 03/24/2013 1853   PHURINE 6.0 07/02/2023 1406   GLUCOSEU NEGATIVE 07/02/2023 1406   GLUCOSEU Negative 03/24/2013 1853   HGBUR LARGE (A) 07/02/2023 1406   BILIRUBINUR NEGATIVE 07/02/2023 1406   BILIRUBINUR Negative 03/24/2013 1853   KETONESUR NEGATIVE 07/02/2023 1406   PROTEINUR NEGATIVE 07/02/2023 1406   NITRITE NEGATIVE 07/02/2023 1406   LEUKOCYTESUR NEGATIVE 07/02/2023 1406   LEUKOCYTESUR Negative 03/24/2013 1853   Sepsis Labs: @LABRCNTIP (procalcitonin:4,lacticidven:4) )No results found for this or any previous visit (from the past 240 hours).   Radiological Exams on Admission:   Assessment/Plan Principal Problem:   Acute on chronic respiratory failure with hypoxia (HCC) Active Problems:   CAP (community  acquired pneumonia)   COPD with acute exacerbation (HCC)   Acute on chronic combined systolic and diastolic CHF (congestive heart failure) (HCC)   Hypertension   HLD (hyperlipidemia)   Type II diabetes mellitus with renal manifestations (HCC)   CKD stage 3a, GFR 45-59 ml/min (HCC)   Gout   Chronic pain syndrome   Overweight (BMI 25.0-29.9)   TIA (transient ischemic attack)   Assessment and Plan:  Acute on chronic respiratory failure with hypoxia (HCC): pt is on 4L oxygen  at baseline, now requiring high flow nasal cannula oxygen  due to severe respiratory distress.  Likely multifactorial etiology, including CAP, COPD exacerbation and CHF exacerbation.  -Admitted to PCU as inpatient - Bronchodilators and as needed Mucinex  - Rocephin  and doxycycline  for pneumonia - IV Lasix  for CHF exacerbation - Solu-Medrol  iv for COPD exacerbation  CAP (community acquired pneumonia) and COPD with acute exacerbation (HCC): Chest x-ray showed infiltration in right middle lobe and right lower lobe.  Patient has wheezing on auscultation.  No fever or leukocytosis.  Not septic clinically.  - IV Rocephin  and doxycycline  - Incentive spirometry - Solu-Medrol  125 mg, then followed by 80 mg daily - Mucinex  for cough  - Bronchodilators - Urine legionella and S. pneumococcal antigen - Follow up blood culture x2, sputum culture - Check RSEP panel - IVF: Patient received 500 cc normal saline in ED, will discontinue IV fluid due to CHF exacerbation.  Acute on chronic combined systolic and diastolic CHF (congestive heart failure) (HCC): 2D echo on 02/20/2024 showed EF of 40-45% with grade 1 diastolic dysfunction.  Patient has trace leg edema, positive JVD, significantly elevated BNP 5179, consistent with CHF exacerbation. -Lasix  40 mg bid by IV -Daily weights -strict I/O's -Low salt diet -Fluid restriction -As needed bronchodilators for shortness of breath  Hypertension -IV hydralazine  as needed - Continue  home metoprolol  - Patient is on IV Lasix   HLD (hyperlipidemia) -Zocor  and Zetia   Type II diabetes mellitus with renal manifestations (HCC):  Recent A1c 5.7, well-controlled.  Patient taking metformin  and farxiga  -SSI  CKD stage 3a, GFR 45-59 ml/min (HCC): Renal function is worsening recent baseline.  Recent creatinine 1.32 on 07/16/2024.  His creatinine is 1.82, GFR 29, GFR 40.  Likely due to cardiorenal syndrome - Avoid using renal toxic medications - Received 500 cc normal saline in ED  Gout -Allopurinol   TIA: - Aspirin , Plavix  - Zocor   Overweight (BMI 25.0-29.9): Body weight 70.8 kg, BMI 26.63 - Encourage losing weight - Exercise healthy diet  Chronic pain syndrome -Continue home Suboxone  and gabapentin      DVT ppx: SQ Lovenox   Code Status: Full code   Family Communication:  yes, patient's daughter-in-law at bed side.   I also spoke with her daughter by phone  Disposition Plan:  Anticipate discharge back to previous environment  Consults called: None  Admission status and Level of care: Progressive: as inpt        Dispo: The patient is from: Home              Anticipated d/c is to: Home              Anticipated d/c date is: 2 days              Patient currently is not medically stable to d/c.    Severity of Illness:  The appropriate patient status for this patient is INPATIENT. Inpatient status is judged to be reasonable and necessary in order to provide the required intensity of service to ensure the patient's safety. The patient's presenting symptoms, physical exam findings, and initial radiographic and laboratory data in the context of their chronic comorbidities is felt to place them at high risk for further clinical deterioration. Furthermore, it is not anticipated that the patient will be medically stable for discharge from the hospital within 2 midnights of admission.   * I certify that at the point of admission it is my clinical judgment that the patient  will require inpatient hospital care spanning beyond 2 midnights from the point of admission due to high intensity of service, high risk for further deterioration and high frequency of surveillance required.*       Date of Service 11/03/2024    Caleb Exon [facility]   If 7PM-7AM, please contact night-coverage [email or web address] 11/03/2024, 10:29 PM

## 2024-11-03 NOTE — ED Provider Notes (Signed)
 [facility] Provider Note    Event Date/Time   First MD Initiated Contact with Patient 11/03/24 1833     (approximate)   History   Shortness of Breath   HPI  Christian Fields is a 68 y.o. male with history of COPD who presents today with concern of increased shortness of breath.  Apparently this morning woke up she was increasingly winded, checked his oxygen  saturation and found it to be diminished.  He generally wears 4 L of nasal cannula at baseline, he ended up increasing up to 8 L.  Continue to have increased shortness of breath with mild exertion throughout the day causing him to present at this time.  Has not noticed any swelling in his extremities denies any associated chest pain lightheadedness or other symptoms.  He generally uses 1 nebulizer in the morning and 1 in the evening, today he ended up having to use 2 which is much more than his normal.  Apparently recently seen at [facility] and diagnosed with pneumonia and was discharged home.  I reviewed his note from that time, it seems that he was offered admission but patient requested discharge.     Physical Exam   Triage Vital Signs: ED Triage Vitals [11/03/24 1829]  Encounter Vitals Group     BP 117/66     Girls Systolic BP Percentile      Girls Diastolic BP Percentile      Boys Systolic BP Percentile      Boys Diastolic BP Percentile      Pulse Rate (!) 104     Resp (!) 28     Temp 98.1 F (36.7 C)     Temp Source Oral     SpO2 (!) 86 %     Weight 165 lb (74.8 kg)     Height 5' 6 (1.676 m)     Head Circumference      Peak Flow      Pain Score 0     Pain Loc      Pain Education      Exclude from Growth Chart     Most recent vital signs: Vitals:   11/03/24 1829  BP: 117/66  Pulse: (!) 104  Resp: (!) 28  Temp: 98.1 F (36.7 C)  SpO2: (!) 86%     General: Awake, no distress.  CV:  Good peripheral perfusion.  No extremity swelling Resp:  Normal effort.  Diffuse wheezes on  exam, is able to speak in full sentences Abd:  No distention.  Soft nontender Other:     ED Results / Procedures / Treatments   Labs (all labs ordered are listed, but only abnormal results are displayed) Labs Reviewed  CBC - Abnormal; Notable for the following components:      Result Value   RBC 3.98 (*)    Hemoglobin 11.2 (*)    HCT 37.8 (*)    MCHC 29.6 (*)    All other components within normal limits  BASIC METABOLIC PANEL WITH GFR - Abnormal; Notable for the following components:   Glucose, Bld 153 (*)    BUN 29 (*)    Creatinine, Ser 1.82 (*)    Calcium  8.8 (*)    GFR, Estimated 40 (*)    All other components within normal limits  PRO BRAIN NATRIURETIC PEPTIDE - Abnormal; Notable for the following components:   Pro Brain Natriuretic Peptide 5,179.0 (*)    All other components within normal limits  BLOOD  GAS, VENOUS - Abnormal; Notable for the following components:   pO2, Ven 47 (*)    All other components within normal limits     EKG  Sinus rhythm with a rate of about 100, axis of about 110, intervals appear to be within normal limits with a slightly prolonged PR interval, no obvious ischemia and I appreciate this EKG   RADIOLOGY Appears to have a right middle lobe pneumonia and possibly a right lower lobe pneumonia  PROCEDURES:  Critical Care performed: Yes, see critical care procedure note(s)  .Critical Care  Performed by: Fernand Rossie HERO, MD Authorized by: Fernand Rossie HERO, MD   Critical care provider statement:    Critical care time (minutes):  30   Critical care was necessary to treat or prevent imminent or life-threatening deterioration of the following conditions:  Respiratory failure   Critical care was time spent personally by me on the following activities:  Development of treatment plan with patient or surrogate, discussions with consultants, evaluation of patient's response to treatment, examination of patient, ordering and review of laboratory  studies, ordering and review of radiographic studies, ordering and performing treatments and interventions, pulse oximetry, re-evaluation of patient's condition and review of old charts    MEDICATIONS ORDERED IN ED: Medications  methylPREDNISolone  sodium succinate  (SOLU-MEDROL ) 125 MG injection 80 mg (has no administration in time range)  cefTRIAXone  (ROCEPHIN ) 1 g in sodium chloride  0.9 % 100 mL IVPB (1 g Intravenous New Bag/Given 11/03/24 2006)  furosemide  (LASIX ) injection 40 mg (has no administration in time range)  doxycycline  (VIBRAMYCIN ) 100 mg in sodium chloride  0.9 % 250 mL IVPB (has no administration in time range)  ipratropium-albuterol  (DUONEB) 0.5-2.5 (3) MG/3ML nebulizer solution 3 mL (3 mLs Nebulization Given 11/03/24 1928)  ipratropium-albuterol  (DUONEB) 0.5-2.5 (3) MG/3ML nebulizer solution 3 mL (3 mLs Nebulization Given 11/03/24 1928)  ipratropium-albuterol  (DUONEB) 0.5-2.5 (3) MG/3ML nebulizer solution 3 mL (3 mLs Nebulization Given 11/03/24 1928)  sodium chloride  0.9 % bolus 500 mL (500 mLs Intravenous New Bag/Given 11/03/24 2006)     IMPRESSION / MDM / ASSESSMENT AND PLAN / ED COURSE  I reviewed the triage vital signs and the nursing notes.                               Patient's presentation is most consistent with acute presentation with potential threat to life or bodily function.  68 year old male presenting today with concern of increased shortness of breath.  Recent visit to outside hospital with similar complaints and at that time inclined to go home.  Returns today with continued complaints.  Given the exam at this time, and his increased oxygen  requirement, will likely warrant admission.  Awaiting imaging will attempt nebulizer here I suspect likely COPD exacerbation.   Clinical Course as of 11/03/24 2029  Wed Nov 03, 2024  1924 X-ray demonstrating concern of pneumonia, likely in combination with his underlying COPD exacerbation.  We will start him up on  antibiotics and discussed with medicine for admission. [SK]  2028 Spoke with Dr. Hilma who has agreed to evaluate the patient to determine course of further medical management. [SK]    Clinical Course User Index [SK] Fernand Rossie HERO, MD     FINAL CLINICAL IMPRESSION(S) / ED DIAGNOSES   Final diagnoses:  COPD exacerbation (HCC)  Community acquired pneumonia, unspecified laterality     Rx / DC Orders   ED Discharge Orders  None        Note:  This document was prepared using Dragon voice recognition software and may include unintentional dictation errors.   Fernand Rossie HERO, MD 11/03/24 2029

## 2024-11-03 NOTE — ED Notes (Addendum)
 Pt to room #14 from triage.

## 2024-11-04 DIAGNOSIS — J9621 Acute and chronic respiratory failure with hypoxia: Secondary | ICD-10-CM | POA: Diagnosis not present

## 2024-11-04 LAB — CBC
HCT: 34.9 % — ABNORMAL LOW (ref 39.0–52.0)
Hemoglobin: 10.7 g/dL — ABNORMAL LOW (ref 13.0–17.0)
MCH: 28.8 pg (ref 26.0–34.0)
MCHC: 30.7 g/dL (ref 30.0–36.0)
MCV: 94.1 fL (ref 80.0–100.0)
Platelets: 179 K/uL (ref 150–400)
RBC: 3.71 MIL/uL — ABNORMAL LOW (ref 4.22–5.81)
RDW: 15.3 % (ref 11.5–15.5)
WBC: 5.4 K/uL (ref 4.0–10.5)
nRBC: 0 % (ref 0.0–0.2)

## 2024-11-04 LAB — BASIC METABOLIC PANEL WITH GFR
Anion gap: 12 (ref 5–15)
BUN: 27 mg/dL — ABNORMAL HIGH (ref 8–23)
CO2: 27 mmol/L (ref 22–32)
Calcium: 8.5 mg/dL — ABNORMAL LOW (ref 8.9–10.3)
Chloride: 99 mmol/L (ref 98–111)
Creatinine, Ser: 1.68 mg/dL — ABNORMAL HIGH (ref 0.61–1.24)
GFR, Estimated: 44 mL/min — ABNORMAL LOW (ref >60)
Glucose, Bld: 213 mg/dL — ABNORMAL HIGH (ref 70–99)
Potassium: 4.3 mmol/L (ref 3.5–5.1)
Sodium: 139 mmol/L (ref 135–145)

## 2024-11-04 LAB — GLUCOSE, CAPILLARY
Glucose-Capillary: 292 mg/dL — ABNORMAL HIGH (ref 70–99)
Glucose-Capillary: 299 mg/dL — ABNORMAL HIGH (ref 70–99)

## 2024-11-04 LAB — HIV ANTIBODY (ROUTINE TESTING W REFLEX): HIV Screen 4th Generation wRfx: NONREACTIVE

## 2024-11-04 LAB — CBG MONITORING, ED
Glucose-Capillary: 365 mg/dL — ABNORMAL HIGH (ref 70–99)
Glucose-Capillary: 298 mg/dL — ABNORMAL HIGH (ref 70–99)

## 2024-11-04 LAB — STREP PNEUMONIAE URINARY ANTIGEN: Strep Pneumo Urinary Antigen: NEGATIVE

## 2024-11-04 NOTE — Progress Notes (Signed)
 PROGRESS NOTE    Christian Fields  FMW:[id number] DOB: 01/20/1956 DOA: 11/03/2024 PCP: Lora Odor, FNP    Assessment & Plan:   Principal Problem:   Acute on chronic respiratory failure with hypoxia (HCC) Active Problems:   CAP (community acquired pneumonia)   COPD with acute exacerbation (HCC)   Acute on chronic combined systolic and diastolic CHF (congestive heart failure) (HCC)   Hypertension   HLD (hyperlipidemia)   Type II diabetes mellitus with renal manifestations (HCC)   CKD stage 3a, GFR 45-59 ml/min (HCC)   Gout   Chronic pain syndrome   Overweight (BMI 25.0-29.9)   TIA (transient ischemic attack)  Assessment and Plan: Acute on chronic hypoxic respiratory failure: continue on supplemental oxygen  and wean back to baseline as tolerated, currently on HLFNC. Uses 4L [place] at home. Likely multifactorial etiology, including CAP, COPD exacerbation and CHF exacerbation. Continue on IV rocephin , doxy for pneumonia and IV lasix  for CHF exacerbation.    CAP: continue on IV rocephin , doxy, steroids, bronchodilators & encourage incentive spirometry. Strep is neg and legionella is pending still. Blood cxs NGTD  COPD exacerbation: continue on IV steroids, bronchodilators & encourage incentive spirometry    Acute on chronic combined CHF: echo 02/20/2024 showed EF of 40-45%, grade I diastolic dysfunction. Trace leg edema, positive JVD, significantly elevated BNP 5179, consistent with CHF exacerbation. Continue on IV lasix . Monitor I/Os. Fluid restriction on    HTN: continue on metoprolol     HLD: continue on statin, zetia     DM2: well controlled, HbA1c 5.7. Continue on SSI w/ accuchecks   CKDIIIa: recent creatinine baseline 1.32. Cr is trending down from day prior.    Gout: continue on home dose of allopurinol     TIA: continue on aspirin , statin, plavix     Overweight: BMI 26.6. would benefit from weight loss    Chronic pain syndrome: continue on home dose of suboxone ,  gabapentin           DVT prophylaxis: lovenox   Code Status: full  Family Communication: discussed pt's care w/ pt's daughter, Roxie and answered her questions Disposition Plan: depends on PT/OT recs (not consulted yet)  Level of care: Progressive Consultants:    Procedures:   Antimicrobials: rocephin , doxy   Subjective: Pt c/o shortness of breath   Objective: Vitals:   11/04/24 0500 11/04/24 0808 11/04/24 0825 11/04/24 0930  BP: 95/63 125/74    Pulse: 97 97    Resp: 20 13    Temp: 98.2 F (36.8 C)   97.9 F (36.6 C)  TempSrc: Oral   Oral  SpO2: 92% 97% 99%   Weight:      Height:        Intake/Output Summary (Last 24 hours) at 11/04/2024 0959 Last data filed at 11/04/2024 9072 Gross per 24 hour  Intake 900 ml  Output 500 ml  Net 400 ml   Filed Weights   11/03/24 1829  Weight: 74.8 kg    Examination:  General exam: Appears calm and comfortable  Respiratory system: diminished breath sounds b/l  Cardiovascular system: S1 & S2 +. No rubs, gallops or clicks. Gastrointestinal system: Abdomen is nondistended, soft and nontender.  Normal bowel sounds heard. Central nervous system: Alert and oriented. Moves all extremities  Psychiatry: Judgement and insight appears at baseline.flat mood and affect    Data Reviewed: I have personally reviewed following labs and imaging studies  CBC: Recent Labs  Lab 11/03/24 1850 11/04/24 0329  WBC 8.6 5.4  HGB 11.2* 10.7*  HCT 37.8* 34.9*  MCV 95.0 94.1  PLT 196 179   Basic Metabolic Panel: Recent Labs  Lab 11/03/24 1850 11/04/24 0329  NA 137 139  K 4.3 4.3  CL 99 99  CO2 25 27  GLUCOSE 153* 213*  BUN 29* 27*  CREATININE 1.82* 1.68*  CALCIUM  8.8* 8.5*   GFR: Estimated Creatinine Clearance: 37.4 mL/min (A) (by C-G formula based on SCr of 1.68 mg/dL (H)). Liver Function Tests: No results for input(s): AST, ALT, ALKPHOS, BILITOT, PROT, ALBUMIN  in the last 168 hours. No results for input(s):  LIPASE, AMYLASE in the last 168 hours. No results for input(s): AMMONIA in the last 168 hours. Coagulation Profile: No results for input(s): INR, PROTIME in the last 168 hours. Cardiac Enzymes: No results for input(s): CKTOTAL, CKMB, CKMBINDEX, TROPONINI in the last 168 hours. BNP (last 3 results) Recent Labs    11/03/24 1850  PROBNP 5,179.0*   HbA1C: No results for input(s): HGBA1C in the last 72 hours. CBG: Recent Labs  Lab 11/03/24 2132 11/04/24 0756  GLUCAP 111* 365*   Lipid Profile: No results for input(s): CHOL, HDL, LDLCALC, TRIG, CHOLHDL, LDLDIRECT in the last 72 hours. Thyroid  Function Tests: No results for input(s): TSH, T4TOTAL, FREET4, T3FREE, THYROIDAB in the last 72 hours. Anemia Panel: No results for input(s): VITAMINB12, FOLATE, FERRITIN, TIBC, IRON , RETICCTPCT in the last 72 hours. Sepsis Labs: No results for input(s): PROCALCITON, LATICACIDVEN in the last 168 hours.  Recent Results (from the past 240 hours)  Culture, blood (Routine X 2) w Reflex to ID Panel     Status: None (Preliminary result)   Collection Time: 11/03/24  9:11 PM   Specimen: BLOOD  Result Value Ref Range Status   Specimen Description BLOOD BLOOD LEFT ARM  Final   Special Requests   Final    BOTTLES DRAWN AEROBIC AND ANAEROBIC Blood Culture adequate volume   Culture   Final    NO GROWTH < 12 HOURS Performed at [facility], [address], [place], [place] [address]    Report Status PENDING  Incomplete  Culture, blood (Routine X 2) w Reflex to ID Panel     Status: None (Preliminary result)   Collection Time: 11/03/24  9:11 PM   Specimen: BLOOD  Result Value Ref Range Status   Specimen Description BLOOD BLOOD RIGHT ARM  Final   Special Requests   Final    BOTTLES DRAWN AEROBIC AND ANAEROBIC Blood Culture adequate volume   Culture   Final    NO GROWTH < 12 HOURS Performed at [facility], [address], [place], [place] [address]    Report Status PENDING  Incomplete  Resp panel by RT-PCR (RSV, Flu A&B, Covid)     Status: None   Collection Time: 11/03/24  9:11 PM   Specimen: Nasal Swab  Result Value Ref Range Status   SARS Coronavirus 2 by RT PCR NEGATIVE NEGATIVE Final    Comment: (NOTE) SARS-CoV-2 target nucleic acids are NOT DETECTED.  The SARS-CoV-2 RNA is generally detectable in upper respiratory specimens during the acute phase of infection. The lowest concentration of SARS-CoV-2 viral copies this assay can detect is 138 copies/mL. A negative result does not preclude SARS-Cov-2 infection and should not be used as the sole basis for treatment or other patient management decisions. A negative result may occur with  improper specimen collection/handling, submission of specimen other than nasopharyngeal swab, presence of viral mutation(s) within the areas targeted by this assay, and inadequate  number of viral copies(<138 copies/mL). A negative result must be combined with clinical observations, patient history, and epidemiological information. The expected result is Negative.  Fact Sheet for Patients:  bloggercourse.com  Fact Sheet for Healthcare Providers:  seriousbroker.it  This test is no t yet approved or cleared by the [place]  FDA and  has been authorized for detection and/or diagnosis of SARS-CoV-2 by FDA under an Emergency Use Authorization (EUA). This EUA will remain  in effect (meaning this test can be used) for the duration of the COVID-19 declaration under Section 564(b)(1) of the Act, 21 U.S.C.section 360bbb-3(b)(1), unless the authorization is terminated  or revoked sooner.       Influenza A by PCR NEGATIVE NEGATIVE Final   Influenza B by PCR NEGATIVE NEGATIVE Final    Comment: (NOTE) The Xpert Xpress SARS-CoV-2/FLU/RSV plus assay is intended as an aid in the diagnosis of influenza from Nasopharyngeal  swab specimens and should not be used as a sole basis for treatment. Nasal washings and aspirates are unacceptable for Xpert Xpress SARS-CoV-2/FLU/RSV testing.  Fact Sheet for Patients: bloggercourse.com  Fact Sheet for Healthcare Providers: seriousbroker.it  This test is not yet approved or cleared by the [place]  FDA and has been authorized for detection and/or diagnosis of SARS-CoV-2 by FDA under an Emergency Use Authorization (EUA). This EUA will remain in effect (meaning this test can be used) for the duration of the COVID-19 declaration under Section 564(b)(1) of the Act, 21 U.S.C. section 360bbb-3(b)(1), unless the authorization is terminated or revoked.     Resp Syncytial Virus by PCR NEGATIVE NEGATIVE Final    Comment: (NOTE) Fact Sheet for Patients: bloggercourse.com  Fact Sheet for Healthcare Providers: seriousbroker.it  This test is not yet approved or cleared by the [place]  FDA and has been authorized for detection and/or diagnosis of SARS-CoV-2 by FDA under an Emergency Use Authorization (EUA). This EUA will remain in effect (meaning this test can be used) for the duration of the COVID-19 declaration under Section 564(b)(1) of the Act, 21 U.S.C. section 360bbb-3(b)(1), unless the authorization is terminated or revoked.  Performed at [facility], [address], [place], [place] [address]          Radiology Studies: DG Chest Portable 1 View Result Date: 11/03/2024 CLINICAL DATA:  SOB EXAM: PORTABLE CHEST - 1 VIEW COMPARISON:  July 15, 2024 FINDINGS: Emphysema. Hazy airspace opacities in the right mid and lower lung zones. No pleural effusion or pneumothorax. Unchanged small subpleural nodule in the left upper lung zone measuring 1.6 cm. No cardiomegaly. Tortuous aorta with aortic atherosclerosis. No acute fracture or destructive lesions.  Multilevel thoracic osteophytosis. IMPRESSION: Emphysema. Hazy airspace opacities in the right mid and lower lung zones, worrisome for bronchopneumonia. Electronically Signed   By: Rogelia Myers M.D.   On: 11/03/2024 19:03        Scheduled Meds:  allopurinol   600 mg Oral Daily   aspirin  EC  81 mg Oral Daily   buprenorphine -naloxone   1 tablet Sublingual TID   clopidogrel   75 mg Oral Daily   DULoxetine   60 mg Oral Daily   enoxaparin  (LOVENOX ) injection  40 mg Subcutaneous Q24H   ezetimibe   10 mg Oral Daily   furosemide   40 mg Intravenous Q12H   gabapentin   800 mg Oral QID   insulin  aspart  0-5 Units Subcutaneous QHS   insulin  aspart  0-9 Units Subcutaneous TID WC   ipratropium-albuterol   3 mL Nebulization Q4H   methylPREDNISolone  (SOLU-MEDROL ) injection  80 mg Intravenous Daily   metoprolol  succinate  50 mg Oral Daily   multivitamin with minerals   Oral q morning   roflumilast   500 mcg Oral Daily   simvastatin   40 mg Oral QHS   Continuous Infusions:  cefTRIAXone  (ROCEPHIN )  IV     doxycycline  (VIBRAMYCIN ) IV       LOS: 1 day     Anthony CHRISTELLA Pouch, MD [facility] Pager [phone]  If 7PM-7AM, please contact night-coverage [email or web address] 11/04/2024, 9:59 AM

## 2024-11-05 DIAGNOSIS — J9621 Acute and chronic respiratory failure with hypoxia: Secondary | ICD-10-CM | POA: Diagnosis not present

## 2024-11-05 LAB — CBC
HCT: 36.3 % — ABNORMAL LOW (ref 39.0–52.0)
Hemoglobin: 11.3 g/dL — ABNORMAL LOW (ref 13.0–17.0)
MCH: 28.5 pg (ref 26.0–34.0)
MCHC: 31.1 g/dL (ref 30.0–36.0)
MCV: 91.7 fL (ref 80.0–100.0)
Platelets: 209 K/uL (ref 150–400)
RBC: 3.96 MIL/uL — ABNORMAL LOW (ref 4.22–5.81)
RDW: 15.1 % (ref 11.5–15.5)
WBC: 12.8 K/uL — ABNORMAL HIGH (ref 4.0–10.5)
nRBC: 0 % (ref 0.0–0.2)

## 2024-11-05 LAB — BASIC METABOLIC PANEL WITH GFR
Anion gap: 12 (ref 5–15)
BUN: 25 mg/dL — ABNORMAL HIGH (ref 8–23)
CO2: 31 mmol/L (ref 22–32)
Calcium: 8.3 mg/dL — ABNORMAL LOW (ref 8.9–10.3)
Chloride: 98 mmol/L (ref 98–111)
Creatinine, Ser: 1.41 mg/dL — ABNORMAL HIGH (ref 0.61–1.24)
GFR, Estimated: 54 mL/min — ABNORMAL LOW (ref >60)
Glucose, Bld: 295 mg/dL — ABNORMAL HIGH (ref 70–99)
Potassium: 3.4 mmol/L — ABNORMAL LOW (ref 3.5–5.1)
Sodium: 141 mmol/L (ref 135–145)

## 2024-11-05 LAB — MRSA NEXT GEN BY PCR, NASAL: MRSA by PCR Next Gen: NOT DETECTED

## 2024-11-05 LAB — GLUCOSE, CAPILLARY
Glucose-Capillary: 275 mg/dL — ABNORMAL HIGH (ref 70–99)
Glucose-Capillary: 247 mg/dL — ABNORMAL HIGH (ref 70–99)
Glucose-Capillary: 330 mg/dL — ABNORMAL HIGH (ref 70–99)
Glucose-Capillary: 292 mg/dL — ABNORMAL HIGH (ref 70–99)

## 2024-11-05 LAB — LEGIONELLA PNEUMOPHILA SEROGP 1 UR AG: L. pneumophila Serogp 1 Ur Ag: NEGATIVE

## 2024-11-05 MED ORDER — POLYVINYL ALCOHOL 1.4 % OP SOLN
2.0000 [drp] | OPHTHALMIC | Status: DC | PRN
  Administered 2024-11-05: 2 [drp] via OPHTHALMIC
  Filled 2024-11-05: qty 15

## 2024-11-05 MED ORDER — POTASSIUM CHLORIDE CRYS ER 20 MEQ PO TBCR
20.0000 meq | EXTENDED_RELEASE_TABLET | Freq: Once | ORAL | Status: AC
  Administered 2024-11-05: 20 meq via ORAL
  Filled 2024-11-05: qty 1

## 2024-11-05 MED ORDER — IPRATROPIUM-ALBUTEROL 0.5-2.5 (3) MG/3ML IN SOLN
3.0000 mL | Freq: Four times a day (QID) | RESPIRATORY_TRACT | Status: DC
  Administered 2024-11-05 – 2024-11-06 (×3): 3 mL via RESPIRATORY_TRACT
  Filled 2024-11-05 (×4): qty 3

## 2024-11-05 MED ORDER — DOCUSATE SODIUM 100 MG PO CAPS
200.0000 mg | ORAL_CAPSULE | Freq: Two times a day (BID) | ORAL | Status: DC
  Administered 2024-11-05 – 2024-11-07 (×4): 200 mg via ORAL
  Filled 2024-11-05 (×5): qty 2

## 2024-11-05 MED ORDER — POLYETHYLENE GLYCOL 3350 17 G PO PACK
17.0000 g | PACK | Freq: Every day | ORAL | Status: DC | PRN
  Administered 2024-11-05: 17 g via ORAL
  Filled 2024-11-05: qty 1

## 2024-11-05 NOTE — Consult Note (Signed)
 WOC Nurse Consult Note: Reason for Consult: foot ulcers  Wound type: full thickness L and R plantar foot (base of great toe) dark thickened calluses neuropathic  Calluses noted to L heel and base of R 5th digit  Pressure Injury POA: not pressure  Measurement: see nursing flowsheet  Wound bed: dry  Drainage (amount, consistency, odor) none  Periwound: intact  Dressing procedure/placement/frequency: Cleanse B feet with soap and water, paint L and R plantar foot calluses with Betadine  2 times daily and all to air dry.  Cover with silicone foam.    POC discussed with bedside nurse. WOC team will not follow.  Patient would benefit from podiatry as outpatient for debridement of calluses.    Thank you,    Powell Bar MSN, RN-BC, [employer]

## 2024-11-05 NOTE — Plan of Care (Signed)
  Problem: Education: Goal: Ability to describe self-care measures that may prevent or decrease complications (Diabetes Survival Skills Education) will improve Outcome: Progressing   Problem: Coping: Goal: Ability to adjust to condition or change in health will improve Outcome: Progressing   Problem: Fluid Volume: Goal: Ability to maintain a balanced intake and output will improve Outcome: Progressing   Problem: Metabolic: Goal: Ability to maintain appropriate glucose levels will improve Outcome: Progressing   Problem: Skin Integrity: Goal: Risk for impaired skin integrity will decrease Outcome: Progressing   Problem: Clinical Measurements: Goal: Ability to maintain clinical measurements within normal limits will improve Outcome: Progressing Goal: Diagnostic test results will improve Outcome: Progressing   Problem: Activity: Goal: Risk for activity intolerance will decrease Outcome: Progressing   Problem: Elimination: Goal: Will not experience complications related to urinary retention Outcome: Progressing

## 2024-11-05 NOTE — Care Management Important Message (Signed)
 Important Message  Patient Details  Name: Christian Fields MRN: [id number] Date of Birth: 30-Apr-1956   Important Message Given:  Yes - Medicare IM     [name] 11/05/2024, 5:14 PM

## 2024-11-05 NOTE — Inpatient Diabetes Management (Addendum)
 Inpatient Diabetes Program Recommendations  AACE/ADA: New Consensus Statement on Inpatient Glycemic Control (2015)  Target Ranges:  Prepandial:   less than 140 mg/dL      Peak postprandial:   less than 180 mg/dL (1-2 hours)      Critically ill patients:  140 - 180 mg/dL    Latest Reference Range & Units 11/04/24 07:56 11/04/24 12:51 11/04/24 17:23 11/04/24 21:33  Glucose-Capillary 70 - 99 mg/dL 634 (H) 701 (H) 707 (H) 299 (H)  (H): Data is abnormally high  Latest Reference Range & Units 11/05/24 09:26 11/05/24 11:29  Glucose-Capillary 70 - 99 mg/dL 724 (H) 752 (H)  (H): Data is abnormally high   Admit with: Acute on chronic hypoxic respiratory failure/ CAP   History: DM2  Home DM Meds: Metformin  1000 mg daily       Farxiga  10 mg daily  Current Orders: Novolog  Sensitive Correction Scale/ SSI (0-9 units) TID AC + HS     MD- Note pt getting Solumedrol 80 mg daily CBGs >200 If pt to remain on steroids, please consider starting Semglee 12 units daily (0.15 units/kg)   --Will follow patient during hospitalization--  Adina Rudolpho Arrow RN, MSN, CDCES Diabetes Coordinator Inpatient Glycemic Control Team Team Pager: [phone] (8a-5p)

## 2024-11-05 NOTE — Progress Notes (Signed)
 PROGRESS NOTE    Christian Fields  FMW:[id number] DOB: Sep 11, 1956 DOA: 11/03/2024 PCP: Lora Odor, FNP    Assessment & Plan:   Principal Problem:   Acute on chronic respiratory failure with hypoxia (HCC) Active Problems:   CAP (community acquired pneumonia)   COPD with acute exacerbation (HCC)   Acute on chronic combined systolic and diastolic CHF (congestive heart failure) (HCC)   Hypertension   HLD (hyperlipidemia)   Type II diabetes mellitus with renal manifestations (HCC)   CKD stage 3a, GFR 45-59 ml/min (HCC)   Gout   Chronic pain syndrome   Overweight (BMI 25.0-29.9)   TIA (transient ischemic attack)  Assessment and Plan: Acute on chronic hypoxic respiratory failure: continue on supplemental oxygen  and wean back to baseline as tolerated, currently on HLFNC. Uses 4L [place] at home. Likely multifactorial etiology, including CAP, COPD exacerbation and CHF exacerbation. Continue on IV rocephin , doxy for pneumonia and IV lasix  for CHF exacerbation.    CAP: continue on IV rocephin , doxy, steroids, bronchodilators & encourage incentive spirometry. Strep is neg and legionella is pending still. Blood cxs NGTD  COPD exacerbation: continue on IV steroids, bronchodilators & encourage incentive spirometry    Acute on chronic combined CHF: echo 02/20/2024 showed EF of 40-45%, grade I diastolic dysfunction. Trace leg edema, positive JVD, significantly elevated BNP 5179, consistent with CHF exacerbation. Continue on IV lasix . Monitor I/Os. Fluid restriction on   Hypokalemia: potassium given    HTN: continue on metoprolol     HLD: continue on zetia , statin    DM2: well controlled, HbA1c 5.7. Continue on SSI w/ accuchecks    CKDIIIa: recent creatinine baseline 1.32. Cr is trending down again from day prior    Gout: continue on home dose of allopurinol     TIA: continue on plavix , aspirin , statin    Overweight: BMI 26.6. would benefit from weight loss    Chronic pain  syndrome: continue on home dose of suboxone , gabapentin           DVT prophylaxis: lovenox   Code Status: full  Family Communication: discussed pt's care w/ pt's daughter, Roxie and answered her questions Disposition Plan: depends on PT/OT recs (not consulted yet)  Level of care: Progressive Consultants:    Procedures:   Antimicrobials: rocephin , doxy   Subjective: Pt c/o shortness of breath   Objective: Vitals:   11/05/24 0056 11/05/24 0437 11/05/24 0437 11/05/24 0741  BP:   118/78 (!) 144/80  Pulse:   92 95  Resp:   18 20  Temp:   97.8 F (36.6 C) 97.6 F (36.4 C)  TempSrc:   Oral Oral  SpO2: 92%  91% 95%  Weight:  77.9 kg    Height:        Intake/Output Summary (Last 24 hours) at 11/05/2024 0903 Last data filed at 11/05/2024 0438 Gross per 24 hour  Intake 1141.08 ml  Output 2550 ml  Net -1408.92 ml   Filed Weights   11/03/24 1829 11/05/24 0437  Weight: 74.8 kg 77.9 kg    Examination:  General exam: Appears calm and comfortable  Respiratory system: diminished breath sounds b/l  Cardiovascular system: S1 & S2 +. No rubs, gallops or clicks. Gastrointestinal system: Abdomen is nondistended, soft and nontender.  Normal bowel sounds heard. Central nervous system: Alert and oriented. Moves all extremities  Psychiatry: Judgement and insight appears at baseline.flat mood and affect    Data Reviewed: I have personally reviewed following labs and imaging studies  CBC: Recent Labs  Lab  11/03/24 1850 11/04/24 0329 11/05/24 0503  WBC 8.6 5.4 12.8*  HGB 11.2* 10.7* 11.3*  HCT 37.8* 34.9* 36.3*  MCV 95.0 94.1 91.7  PLT 196 179 209   Basic Metabolic Panel: Recent Labs  Lab 11/03/24 1850 11/04/24 0329 11/05/24 0503  NA 137 139 141  K 4.3 4.3 3.4*  CL 99 99 98  CO2 25 27 31   GLUCOSE 153* 213* 295*  BUN 29* 27* 25*  CREATININE 1.82* 1.68* 1.41*  CALCIUM  8.8* 8.5* 8.3*   GFR: Estimated Creatinine Clearance: 48.5 mL/min (A) (by C-G formula  based on SCr of 1.41 mg/dL (H)). Liver Function Tests: No results for input(s): AST, ALT, ALKPHOS, BILITOT, PROT, ALBUMIN  in the last 168 hours. No results for input(s): LIPASE, AMYLASE in the last 168 hours. No results for input(s): AMMONIA in the last 168 hours. Coagulation Profile: No results for input(s): INR, PROTIME in the last 168 hours. Cardiac Enzymes: No results for input(s): CKTOTAL, CKMB, CKMBINDEX, TROPONINI in the last 168 hours. BNP (last 3 results) Recent Labs    11/03/24 1850  PROBNP 5,179.0*   HbA1C: No results for input(s): HGBA1C in the last 72 hours. CBG: Recent Labs  Lab 11/03/24 2132 11/04/24 0756 11/04/24 1251 11/04/24 1723 11/04/24 2133  GLUCAP 111* 365* 298* 292* 299*   Lipid Profile: No results for input(s): CHOL, HDL, LDLCALC, TRIG, CHOLHDL, LDLDIRECT in the last 72 hours. Thyroid  Function Tests: No results for input(s): TSH, T4TOTAL, FREET4, T3FREE, THYROIDAB in the last 72 hours. Anemia Panel: No results for input(s): VITAMINB12, FOLATE, FERRITIN, TIBC, IRON , RETICCTPCT in the last 72 hours. Sepsis Labs: No results for input(s): PROCALCITON, LATICACIDVEN in the last 168 hours.  Recent Results (from the past 240 hours)  Culture, blood (Routine X 2) w Reflex to ID Panel     Status: None (Preliminary result)   Collection Time: 11/03/24  9:11 PM   Specimen: BLOOD  Result Value Ref Range Status   Specimen Description BLOOD BLOOD LEFT ARM  Final   Special Requests   Final    BOTTLES DRAWN AEROBIC AND ANAEROBIC Blood Culture adequate volume   Culture   Final    NO GROWTH 2 DAYS Performed at [facility], [address] [place], [place] [address]    Report Status PENDING  Incomplete  Culture, blood (Routine X 2) w Reflex to ID Panel     Status: None (Preliminary result)   Collection Time: 11/03/24  9:11 PM   Specimen: BLOOD  Result Value Ref Range Status    Specimen Description BLOOD BLOOD RIGHT ARM  Final   Special Requests   Final    BOTTLES DRAWN AEROBIC AND ANAEROBIC Blood Culture adequate volume   Culture   Final    NO GROWTH 2 DAYS Performed at [facility], [address] [place], [place] [address]    Report Status PENDING  Incomplete  Resp panel by RT-PCR (RSV, Flu A&B, Covid)     Status: None   Collection Time: 11/03/24  9:11 PM   Specimen: Nasal Swab  Result Value Ref Range Status   SARS Coronavirus 2 by RT PCR NEGATIVE NEGATIVE Final    Comment: (NOTE) SARS-CoV-2 target nucleic acids are NOT DETECTED.  The SARS-CoV-2 RNA is generally detectable in upper respiratory specimens during the acute phase of infection. The lowest concentration of SARS-CoV-2 viral copies this assay can detect is 138 copies/mL. A negative result does not preclude SARS-Cov-2 infection and should not be used as the sole basis  for treatment or other patient management decisions. A negative result may occur with  improper specimen collection/handling, submission of specimen other than nasopharyngeal swab, presence of viral mutation(s) within the areas targeted by this assay, and inadequate number of viral copies(<138 copies/mL). A negative result must be combined with clinical observations, patient history, and epidemiological information. The expected result is Negative.  Fact Sheet for Patients:  bloggercourse.com  Fact Sheet for Healthcare Providers:  seriousbroker.it  This test is no t yet approved or cleared by the [place]  FDA and  has been authorized for detection and/or diagnosis of SARS-CoV-2 by FDA under an Emergency Use Authorization (EUA). This EUA will remain  in effect (meaning this test can be used) for the duration of the COVID-19 declaration under Section 564(b)(1) of the Act, 21 U.S.C.section 360bbb-3(b)(1), unless the authorization is terminated  or revoked sooner.        Influenza A by PCR NEGATIVE NEGATIVE Final   Influenza B by PCR NEGATIVE NEGATIVE Final    Comment: (NOTE) The Xpert Xpress SARS-CoV-2/FLU/RSV plus assay is intended as an aid in the diagnosis of influenza from Nasopharyngeal swab specimens and should not be used as a sole basis for treatment. Nasal washings and aspirates are unacceptable for Xpert Xpress SARS-CoV-2/FLU/RSV testing.  Fact Sheet for Patients: bloggercourse.com  Fact Sheet for Healthcare Providers: seriousbroker.it  This test is not yet approved or cleared by the [place]  FDA and has been authorized for detection and/or diagnosis of SARS-CoV-2 by FDA under an Emergency Use Authorization (EUA). This EUA will remain in effect (meaning this test can be used) for the duration of the COVID-19 declaration under Section 564(b)(1) of the Act, 21 U.S.C. section 360bbb-3(b)(1), unless the authorization is terminated or revoked.     Resp Syncytial Virus by PCR NEGATIVE NEGATIVE Final    Comment: (NOTE) Fact Sheet for Patients: bloggercourse.com  Fact Sheet for Healthcare Providers: seriousbroker.it  This test is not yet approved or cleared by the [place]  FDA and has been authorized for detection and/or diagnosis of SARS-CoV-2 by FDA under an Emergency Use Authorization (EUA). This EUA will remain in effect (meaning this test can be used) for the duration of the COVID-19 declaration under Section 564(b)(1) of the Act, 21 U.S.C. section 360bbb-3(b)(1), unless the authorization is terminated or revoked.  Performed at [facility], [address] [place], [place] [address]   MRSA Next Gen by PCR, Nasal     Status: None   Collection Time: 11/05/24  5:17 AM   Specimen: Nasal Mucosa; Nasal Swab  Result Value Ref Range Status   MRSA by PCR Next Gen NOT DETECTED NOT DETECTED Final    Comment:  (NOTE) The GeneXpert MRSA Assay (FDA approved for NASAL specimens only), is one component of a comprehensive MRSA colonization surveillance program. It is not intended to diagnose MRSA infection nor to guide or monitor treatment for MRSA infections. Test performance is not FDA approved in patients less than 25 years old. Performed at [facility], [address], [place], [place] [address]          Radiology Studies: DG Chest Portable 1 View Result Date: 11/03/2024 CLINICAL DATA:  SOB EXAM: PORTABLE CHEST - 1 VIEW COMPARISON:  July 15, 2024 FINDINGS: Emphysema. Hazy airspace opacities in the right mid and lower lung zones. No pleural effusion or pneumothorax. Unchanged small subpleural nodule in the left upper lung zone measuring 1.6 cm. No cardiomegaly. Tortuous aorta with aortic atherosclerosis. No acute fracture or  destructive lesions. Multilevel thoracic osteophytosis. IMPRESSION: Emphysema. Hazy airspace opacities in the right mid and lower lung zones, worrisome for bronchopneumonia. Electronically Signed   By: Rogelia Myers M.D.   On: 11/03/2024 19:03        Scheduled Meds:  allopurinol   600 mg Oral Daily   aspirin  EC  81 mg Oral Daily   buprenorphine -naloxone   1 tablet Sublingual TID   clopidogrel   75 mg Oral Daily   DULoxetine   60 mg Oral Daily   enoxaparin  (LOVENOX ) injection  40 mg Subcutaneous Q24H   ezetimibe   10 mg Oral Daily   furosemide   40 mg Intravenous Q12H   gabapentin   800 mg Oral QID   insulin  aspart  0-5 Units Subcutaneous QHS   insulin  aspart  0-9 Units Subcutaneous TID WC   ipratropium-albuterol   3 mL Nebulization Q4H   methylPREDNISolone  (SOLU-MEDROL ) injection  80 mg Intravenous Daily   metoprolol  succinate  50 mg Oral Daily   multivitamin with minerals   Oral q morning   roflumilast   500 mcg Oral Daily   simvastatin   40 mg Oral QHS   Continuous Infusions:  cefTRIAXone  (ROCEPHIN )  IV 1 g (11/04/24 2031)   doxycycline  (VIBRAMYCIN ) IV  100 mg (11/04/24 2229)     LOS: 2 days     Anthony CHRISTELLA Pouch, MD [facility] Pager [phone]  If 7PM-7AM, please contact night-coverage [email or web address] 11/05/2024, 9:03 AM

## 2024-11-06 DIAGNOSIS — J9621 Acute and chronic respiratory failure with hypoxia: Secondary | ICD-10-CM | POA: Diagnosis not present

## 2024-11-06 LAB — CBC
HCT: 38.9 % — ABNORMAL LOW (ref 39.0–52.0)
Hemoglobin: 11.9 g/dL — ABNORMAL LOW (ref 13.0–17.0)
MCH: 28.1 pg (ref 26.0–34.0)
MCHC: 30.6 g/dL (ref 30.0–36.0)
MCV: 91.7 fL (ref 80.0–100.0)
Platelets: 289 K/uL (ref 150–400)
RBC: 4.24 MIL/uL (ref 4.22–5.81)
RDW: 15.5 % (ref 11.5–15.5)
WBC: 19.5 K/uL — ABNORMAL HIGH (ref 4.0–10.5)
nRBC: 0.1 % (ref 0.0–0.2)

## 2024-11-06 LAB — GLUCOSE, CAPILLARY
Glucose-Capillary: 120 mg/dL — ABNORMAL HIGH (ref 70–99)
Glucose-Capillary: 230 mg/dL — ABNORMAL HIGH (ref 70–99)
Glucose-Capillary: 318 mg/dL — ABNORMAL HIGH (ref 70–99)
Glucose-Capillary: 447 mg/dL — ABNORMAL HIGH (ref 70–99)
Glucose-Capillary: 439 mg/dL — ABNORMAL HIGH (ref 70–99)
Glucose-Capillary: 321 mg/dL — ABNORMAL HIGH (ref 70–99)

## 2024-11-06 LAB — BASIC METABOLIC PANEL WITH GFR
Anion gap: 11 (ref 5–15)
BUN: 31 mg/dL — ABNORMAL HIGH (ref 8–23)
CO2: 34 mmol/L — ABNORMAL HIGH (ref 22–32)
Calcium: 8.4 mg/dL — ABNORMAL LOW (ref 8.9–10.3)
Chloride: 95 mmol/L — ABNORMAL LOW (ref 98–111)
Creatinine, Ser: 1.26 mg/dL — ABNORMAL HIGH (ref 0.61–1.24)
GFR, Estimated: >60 mL/min (ref >60)
Glucose, Bld: 154 mg/dL — ABNORMAL HIGH (ref 70–99)
Potassium: 3.9 mmol/L (ref 3.5–5.1)
Sodium: 140 mmol/L (ref 135–145)

## 2024-11-06 MED ORDER — INSULIN ASPART 100 UNIT/ML IJ SOLN
20.0000 [IU] | Freq: Once | INTRAMUSCULAR | Status: AC
  Administered 2024-11-06: 20 [IU] via SUBCUTANEOUS
  Filled 2024-11-06: qty 20

## 2024-11-06 MED ORDER — FUROSEMIDE 10 MG/ML IJ SOLN
40.0000 mg | Freq: Every day | INTRAMUSCULAR | Status: DC
  Administered 2024-11-07: 40 mg via INTRAVENOUS
  Filled 2024-11-06: qty 4

## 2024-11-06 MED ORDER — IPRATROPIUM-ALBUTEROL 0.5-2.5 (3) MG/3ML IN SOLN
3.0000 mL | Freq: Three times a day (TID) | RESPIRATORY_TRACT | Status: DC
  Administered 2024-11-06 – 2024-11-07 (×3): 3 mL via RESPIRATORY_TRACT
  Filled 2024-11-06 (×3): qty 3

## 2024-11-06 MED ORDER — METHYLPREDNISOLONE SODIUM SUCC 125 MG IJ SOLR
60.0000 mg | Freq: Every day | INTRAMUSCULAR | Status: DC
  Administered 2024-11-07: 60 mg via INTRAVENOUS
  Filled 2024-11-06: qty 2

## 2024-11-06 NOTE — Plan of Care (Signed)

## 2024-11-06 NOTE — Progress Notes (Signed)
 SATURATION QUALIFICATIONS: (This note is used to comply with regulatory documentation for home oxygen )  Patient Saturations on Room Air at Rest = N/A  Patient Saturations on 4 Liters at rest = 92%  Patient Saturations on Room Air while Ambulating = N/A  Patient Saturations on 4 Liters of oxygen  while Ambulating = 88%  Please briefly explain why patient needs home oxygen :

## 2024-11-06 NOTE — Plan of Care (Signed)
  Problem: Fluid Volume: Goal: Ability to maintain a balanced intake and output will improve Outcome: Progressing   Problem: Health Behavior/Discharge Planning: Goal: Ability to manage health-related needs will improve Outcome: Progressing   Problem: Nutritional: Goal: Maintenance of adequate nutrition will improve Outcome: Progressing   Problem: Skin Integrity: Goal: Risk for impaired skin integrity will decrease Outcome: Progressing   Problem: Tissue Perfusion: Goal: Adequacy of tissue perfusion will improve Outcome: Progressing   Problem: Health Behavior/Discharge Planning: Goal: Ability to manage health-related needs will improve Outcome: Progressing   Problem: Clinical Measurements: Goal: Will remain free from infection Outcome: Progressing Goal: Respiratory complications will improve Outcome: Progressing Goal: Cardiovascular complication will be avoided Outcome: Progressing   Problem: Nutrition: Goal: Adequate nutrition will be maintained Outcome: Progressing   Problem: Elimination: Goal: Will not experience complications related to bowel motility Outcome: Progressing Goal: Will not experience complications related to urinary retention Outcome: Progressing   Problem: Safety: Goal: Ability to remain free from injury will improve Outcome: Progressing   Problem: Skin Integrity: Goal: Risk for impaired skin integrity will decrease Outcome: Progressing   Problem: Cardiac: Goal: Ability to achieve and maintain adequate cardiopulmonary perfusion will improve Outcome: Progressing   Problem: Respiratory: Goal: Ability to maintain a clear airway will improve Outcome: Progressing Goal: Levels of oxygenation will improve Outcome: Progressing Goal: Ability to maintain adequate ventilation will improve Outcome: Progressing

## 2024-11-06 NOTE — Progress Notes (Signed)
 PROGRESS NOTE    Christian Fields  FMW:[id number] DOB: Nov 03, 1956 DOA: 11/03/2024 PCP: Lora Odor, FNP    Assessment & Plan:   Principal Problem:   Acute on chronic respiratory failure with hypoxia (HCC) Active Problems:   CAP (community acquired pneumonia)   COPD with acute exacerbation (HCC)   Acute on chronic combined systolic and diastolic CHF (congestive heart failure) (HCC)   Hypertension   HLD (hyperlipidemia)   Type II diabetes mellitus with renal manifestations (HCC)   CKD stage 3a, GFR 45-59 ml/min (HCC)   Gout   Chronic pain syndrome   Overweight (BMI 25.0-29.9)   TIA (transient ischemic attack)  Assessment and Plan: Acute on chronic hypoxic respiratory failure: continue on supplemental oxygen  and back at baseline oxygen  of 4L [place]. Likely multifactorial etiology, including CAP, COPD exacerbation and CHF exacerbation. Continue on IV rocephin , doxy for pneumonia & IV lasix  for CHF exacerbation.    CAP: continue on IV rocephin , doxy, steroids, bronchodilators & encourage incentive spirometry. Strep is neg and legionella is pending still. Blood cxs NGTD  COPD exacerbation: w/ wheezing today. Continue on IV steroids, bronchodilators & encourage incentive spirometry    Acute on chronic combined CHF: echo 02/20/2024 showed EF of 40-45%, grade I diastolic dysfunction. Trace leg edema, positive JVD, significantly elevated BNP 5179, consistent with CHF exacerbation. Reduced IV lasix  to daily.Monitor I/Os. Fluid restriction on   Hypokalemia: WNL today    HTN: continue on metoprolol     HLD: continue on statin, zetia      DM2: well controlled, HbA1c 5.7. Continue on SSI w/ accuchecks    CKDIIIa: recent creatinine baseline 1.32. Cr continues to trend down    Gout: continue on home dose of allopurinol     TIA:  continue on statin, plavix , aspirin     Overweight: BMI 26.6. would benefit from weight loss    Chronic pain syndrome: continue on home dose of suboxone ,  gabapentin          DVT prophylaxis: lovenox   Code Status: full  Family Communication: discussed pt's care w/ pt's daughter, Roxie and answered her questions Disposition Plan: .likely d/c back home   Level of care: Progressive Consultants:    Procedures:   Antimicrobials: rocephin , doxy   Subjective: Pt c/o malaise   Objective: Vitals:   11/06/24 0353 11/06/24 0506 11/06/24 0757 11/06/24 0904  BP: 116/76  (!) 151/86 (!) 151/86  Pulse: 89  91 90  Resp: 18  18   Temp: 97.9 F (36.6 C)  97.9 F (36.6 C)   TempSrc:   Oral   SpO2: 93%  96%   Weight:  77.2 kg    Height:        Intake/Output Summary (Last 24 hours) at 11/06/2024 1143 Last data filed at 11/06/2024 0950 Gross per 24 hour  Intake 1800 ml  Output 1550 ml  Net 250 ml   Filed Weights   11/03/24 1829 11/05/24 0437 11/06/24 0506  Weight: 74.8 kg 77.9 kg 77.2 kg    Examination:  General exam: appears comfortable  Respiratory system: decreased breath sounds b/l. Wheezes b/l   Cardiovascular system: S1/S2+. No rubs or clicks . Gastrointestinal system: Abd is soft, NT, ND & hypoactive bowel sounds  Central nervous system: alert and oriented. Moves all extremities  Psychiatry: Judgement and insight appears at baseline. Appropriate mood and affect     Data Reviewed: I have personally reviewed following labs and imaging studies  CBC: Recent Labs  Lab 11/03/24 1850 11/04/24 0329 11/05/24 0503  11/06/24 0538  WBC 8.6 5.4 12.8* 19.5*  HGB 11.2* 10.7* 11.3* 11.9*  HCT 37.8* 34.9* 36.3* 38.9*  MCV 95.0 94.1 91.7 91.7  PLT 196 179 209 289   Basic Metabolic Panel: Recent Labs  Lab 11/03/24 1850 11/04/24 0329 11/05/24 0503 11/06/24 0538  NA 137 139 141 140  K 4.3 4.3 3.4* 3.9  CL 99 99 98 95*  CO2 25 27 31  34*  GLUCOSE 153* 213* 295* 154*  BUN 29* 27* 25* 31*  CREATININE 1.82* 1.68* 1.41* 1.26*  CALCIUM  8.8* 8.5* 8.3* 8.4*   GFR: Estimated Creatinine Clearance: 54.2 mL/min (A) (by C-G  formula based on SCr of 1.26 mg/dL (H)). Liver Function Tests: No results for input(s): AST, ALT, ALKPHOS, BILITOT, PROT, ALBUMIN  in the last 168 hours. No results for input(s): LIPASE, AMYLASE in the last 168 hours. No results for input(s): AMMONIA in the last 168 hours. Coagulation Profile: No results for input(s): INR, PROTIME in the last 168 hours. Cardiac Enzymes: No results for input(s): CKTOTAL, CKMB, CKMBINDEX, TROPONINI in the last 168 hours. BNP (last 3 results) Recent Labs    11/03/24 1850  PROBNP 5,179.0*   HbA1C: No results for input(s): HGBA1C in the last 72 hours. CBG: Recent Labs  Lab 11/05/24 0926 11/05/24 1129 11/05/24 1632 11/05/24 2207 11/06/24 0752  GLUCAP 275* 247* 330* 292* 120*   Lipid Profile: No results for input(s): CHOL, HDL, LDLCALC, TRIG, CHOLHDL, LDLDIRECT in the last 72 hours. Thyroid  Function Tests: No results for input(s): TSH, T4TOTAL, FREET4, T3FREE, THYROIDAB in the last 72 hours. Anemia Panel: No results for input(s): VITAMINB12, FOLATE, FERRITIN, TIBC, IRON , RETICCTPCT in the last 72 hours. Sepsis Labs: No results for input(s): PROCALCITON, LATICACIDVEN in the last 168 hours.  Recent Results (from the past 240 hours)  Culture, blood (Routine X 2) w Reflex to ID Panel     Status: None (Preliminary result)   Collection Time: 11/03/24  9:11 PM   Specimen: BLOOD  Result Value Ref Range Status   Specimen Description BLOOD BLOOD LEFT ARM  Final   Special Requests   Final    BOTTLES DRAWN AEROBIC AND ANAEROBIC Blood Culture adequate volume   Culture   Final    NO GROWTH 3 DAYS Performed at [facility], [address], [place], [place] [address]    Report Status PENDING  Incomplete  Culture, blood (Routine X 2) w Reflex to ID Panel     Status: None (Preliminary result)   Collection Time: 11/03/24  9:11 PM   Specimen: BLOOD  Result Value Ref Range Status    Specimen Description BLOOD BLOOD RIGHT ARM  Final   Special Requests   Final    BOTTLES DRAWN AEROBIC AND ANAEROBIC Blood Culture adequate volume   Culture   Final    NO GROWTH 3 DAYS Performed at [facility], [address], [place], [place] [address]    Report Status PENDING  Incomplete  Resp panel by RT-PCR (RSV, Flu A&B, Covid)     Status: None   Collection Time: 11/03/24  9:11 PM   Specimen: Nasal Swab  Result Value Ref Range Status   SARS Coronavirus 2 by RT PCR NEGATIVE NEGATIVE Final    Comment: (NOTE) SARS-CoV-2 target nucleic acids are NOT DETECTED.  The SARS-CoV-2 RNA is generally detectable in upper respiratory specimens during the acute phase of infection. The lowest concentration of SARS-CoV-2 viral copies this assay can detect is 138 copies/mL. A negative result does not preclude  SARS-Cov-2 infection and should not be used as the sole basis for treatment or other patient management decisions. A negative result may occur with  improper specimen collection/handling, submission of specimen other than nasopharyngeal swab, presence of viral mutation(s) within the areas targeted by this assay, and inadequate number of viral copies(<138 copies/mL). A negative result must be combined with clinical observations, patient history, and epidemiological information. The expected result is Negative.  Fact Sheet for Patients:  bloggercourse.com  Fact Sheet for Healthcare Providers:  seriousbroker.it  This test is no t yet approved or cleared by the [place]  FDA and  has been authorized for detection and/or diagnosis of SARS-CoV-2 by FDA under an Emergency Use Authorization (EUA). This EUA will remain  in effect (meaning this test can be used) for the duration of the COVID-19 declaration under Section 564(b)(1) of the Act, 21 U.S.C.section 360bbb-3(b)(1), unless the authorization is terminated  or revoked  sooner.       Influenza A by PCR NEGATIVE NEGATIVE Final   Influenza B by PCR NEGATIVE NEGATIVE Final    Comment: (NOTE) The Xpert Xpress SARS-CoV-2/FLU/RSV plus assay is intended as an aid in the diagnosis of influenza from Nasopharyngeal swab specimens and should not be used as a sole basis for treatment. Nasal washings and aspirates are unacceptable for Xpert Xpress SARS-CoV-2/FLU/RSV testing.  Fact Sheet for Patients: bloggercourse.com  Fact Sheet for Healthcare Providers: seriousbroker.it  This test is not yet approved or cleared by the [place]  FDA and has been authorized for detection and/or diagnosis of SARS-CoV-2 by FDA under an Emergency Use Authorization (EUA). This EUA will remain in effect (meaning this test can be used) for the duration of the COVID-19 declaration under Section 564(b)(1) of the Act, 21 U.S.C. section 360bbb-3(b)(1), unless the authorization is terminated or revoked.     Resp Syncytial Virus by PCR NEGATIVE NEGATIVE Final    Comment: (NOTE) Fact Sheet for Patients: bloggercourse.com  Fact Sheet for Healthcare Providers: seriousbroker.it  This test is not yet approved or cleared by the [place]  FDA and has been authorized for detection and/or diagnosis of SARS-CoV-2 by FDA under an Emergency Use Authorization (EUA). This EUA will remain in effect (meaning this test can be used) for the duration of the COVID-19 declaration under Section 564(b)(1) of the Act, 21 U.S.C. section 360bbb-3(b)(1), unless the authorization is terminated or revoked.  Performed at [facility], [address] [place], [place] [address]   MRSA Next Gen by PCR, Nasal     Status: None   Collection Time: 11/05/24  5:17 AM   Specimen: Nasal Mucosa; Nasal Swab  Result Value Ref Range Status   MRSA by PCR Next Gen NOT DETECTED NOT DETECTED Final     Comment: (NOTE) The GeneXpert MRSA Assay (FDA approved for NASAL specimens only), is one component of a comprehensive MRSA colonization surveillance program. It is not intended to diagnose MRSA infection nor to guide or monitor treatment for MRSA infections. Test performance is not FDA approved in patients less than 68 years old. Performed at [facility], [address] [place], [place] [address]          Radiology Studies: No results found.       Scheduled Meds:  allopurinol   600 mg Oral Daily   aspirin  EC  81 mg Oral Daily   buprenorphine -naloxone   1 tablet Sublingual TID   clopidogrel   75 mg Oral Daily   docusate sodium   200 mg Oral BID  DULoxetine   60 mg Oral Daily   enoxaparin  (LOVENOX ) injection  40 mg Subcutaneous Q24H   ezetimibe   10 mg Oral Daily   furosemide   40 mg Intravenous Q12H   gabapentin   800 mg Oral QID   insulin  aspart  0-5 Units Subcutaneous QHS   insulin  aspart  0-9 Units Subcutaneous TID WC   ipratropium-albuterol   3 mL Nebulization TID   [START ON 11/07/2024] methylPREDNISolone  (SOLU-MEDROL ) injection  60 mg Intravenous Daily   metoprolol  succinate  50 mg Oral Daily   multivitamin with minerals   Oral q morning   roflumilast   500 mcg Oral Daily   simvastatin   40 mg Oral QHS   Continuous Infusions:  cefTRIAXone  (ROCEPHIN )  IV Stopped (11/06/24 0825)   doxycycline  (VIBRAMYCIN ) IV 100 mg (11/06/24 0924)     LOS: 3 days     Anthony CHRISTELLA Pouch, MD [facility] Pager [phone]  If 7PM-7AM, please contact night-coverage [email or web address] 11/06/2024, 11:43 AM

## 2024-11-07 DIAGNOSIS — J9621 Acute and chronic respiratory failure with hypoxia: Secondary | ICD-10-CM | POA: Diagnosis not present

## 2024-11-07 LAB — CBC
HCT: 39.7 % (ref 39.0–52.0)
Hemoglobin: 11.9 g/dL — ABNORMAL LOW (ref 13.0–17.0)
MCH: 28.1 pg (ref 26.0–34.0)
MCHC: 30 g/dL (ref 30.0–36.0)
MCV: 93.9 fL (ref 80.0–100.0)
Platelets: 312 K/uL (ref 150–400)
RBC: 4.23 MIL/uL (ref 4.22–5.81)
RDW: 15.5 % (ref 11.5–15.5)
WBC: 20.6 K/uL — ABNORMAL HIGH (ref 4.0–10.5)
nRBC: 0.2 % (ref 0.0–0.2)

## 2024-11-07 LAB — BASIC METABOLIC PANEL WITH GFR
Anion gap: 9 (ref 5–15)
BUN: 34 mg/dL — ABNORMAL HIGH (ref 8–23)
CO2: 36 mmol/L — ABNORMAL HIGH (ref 22–32)
Calcium: 8.3 mg/dL — ABNORMAL LOW (ref 8.9–10.3)
Chloride: 96 mmol/L — ABNORMAL LOW (ref 98–111)
Creatinine, Ser: 1.09 mg/dL (ref 0.61–1.24)
GFR, Estimated: >60 mL/min (ref >60)
Glucose, Bld: 87 mg/dL (ref 70–99)
Potassium: 3.7 mmol/L (ref 3.5–5.1)
Sodium: 141 mmol/L (ref 135–145)

## 2024-11-07 LAB — GLUCOSE, CAPILLARY
Glucose-Capillary: 310 mg/dL — ABNORMAL HIGH (ref 70–99)
Glucose-Capillary: 106 mg/dL — ABNORMAL HIGH (ref 70–99)
Glucose-Capillary: 279 mg/dL — ABNORMAL HIGH (ref 70–99)

## 2024-11-07 MED ORDER — DOXYCYCLINE HYCLATE 100 MG PO CAPS
100.0000 mg | ORAL_CAPSULE | Freq: Two times a day (BID) | ORAL | 0 refills | Status: DC
  Filled 2024-11-07: qty 4, 2d supply, fill #0

## 2024-11-07 MED ORDER — PREDNISONE 20 MG PO TABS: ORAL_TABLET | ORAL | 0 refills | Status: DC

## 2024-11-07 MED ORDER — DOXYCYCLINE HYCLATE 100 MG PO CAPS: 100.0000 mg | ORAL_CAPSULE | Freq: Two times a day (BID) | ORAL | 0 refills | Status: AC

## 2024-11-07 MED ORDER — MENTHOL 3 MG MT LOZG
1.0000 | LOZENGE | OROMUCOSAL | Status: DC | PRN
  Filled 2024-11-07: qty 9

## 2024-11-07 MED ORDER — PREDNISONE 20 MG PO TABS
ORAL_TABLET | ORAL | 0 refills | Status: DC
  Filled 2024-11-07: qty 12, fill #0

## 2024-11-07 MED ORDER — PHENOL 1.4 % MT LIQD: 1.0000 | OROMUCOSAL | Status: DC | PRN

## 2024-11-07 MED ORDER — DOXYCYCLINE HYCLATE 100 MG PO CAPS: 100.0000 mg | ORAL_CAPSULE | Freq: Two times a day (BID) | ORAL | 0 refills | Status: DC

## 2024-11-07 NOTE — Plan of Care (Signed)

## 2024-11-07 NOTE — Progress Notes (Signed)
 Discharge instructions given to and reviewed with patient. Patient verbalized understanding of all discharge instructions including home med list and new prescriptions. New prescriptions sent to [facility] in [place] since his pharmacy is closed. Daughter taking patient home.

## 2024-11-07 NOTE — Discharge Summary (Signed)
 Physician Discharge Summary  Christian Fields FMW:[id number] DOB: 1956/03/24 DOA: 11/03/2024  PCP: Lora Odor, FNP  Admit date: 11/03/2024 Discharge date: 11/07/2024  Admitted From: home Disposition:  home  Recommendations for Outpatient Follow-up:  Follow up with PCP in 1-2 weeks   Home Health: no  Equipment/Devices:  Discharge Condition: stable  CODE STATUS: full  Diet recommendation: Heart Healthy / Carb Modified   Brief/Interim Summary: HPI was taken from Dr. Hilma: Christian Fields is a 68 y.o. male with medical history significant of COPD on 4 L oxygen , hypertension, hyperlipidemia, diabetes mellitus, asthma, TIA, GERD, gout, depression, OSA not on CPAP, anemia, AAA, polysubstance abuse, chronic pain syndrome on Suboxone , non-small cell lung cancer (s/p of RXT), CKD-3a, who presents with SOB.   Pt states that he was recently treated for pneumonia as outpatient with antibiotics (?  Cefdinir and the doxycycline  per her daughter).  He feels better, but he developed worsening shortness breath since this morning, cough with little thick mucus production, no chest pain, fever or chills.  No nausea, vomiting, diarrhea or abdominal pain.  No symptoms of UTI.    Patient normally uses 4 L oxygen  at baseline, found to have severe respiratory distress, cannot speak in full sentence, oxygen  desaturated to 80s per her daughter-in-law at the bedside, initially started 8 L of oxygen , still has respiratory distress, then started high flow nasal cannula oxygen  in ED   Data reviewed independently and ED Course: pt was found to have WBC 8.6, worsening renal function, temperature normal, blood pressure 117/66, heart rate of 104, RR 28.  VBG with pH 7.26, CO2 60, O2 47.  Chest x-ray showed infiltration in right middle and right lower lobes.  Patient is admitted to PCU as inpatient.     EKG: I have personally reviewed.  Sinus rhythm, QTc 477, borderline LAD, poor R wave progression, anteroseptal  infarction pattern   Discharge Diagnoses:  Principal Problem:   Acute on chronic respiratory failure with hypoxia (HCC) Active Problems:   CAP (community acquired pneumonia)   COPD with acute exacerbation (HCC)   Acute on chronic combined systolic and diastolic CHF (congestive heart failure) (HCC)   Hypertension   HLD (hyperlipidemia)   Type II diabetes mellitus with renal manifestations (HCC)   CKD stage 3a, GFR 45-59 ml/min (HCC)   Gout   Chronic pain syndrome   Overweight (BMI 25.0-29.9)   TIA (transient ischemic attack)  Acute on chronic hypoxic respiratory failure: continue on supplemental oxygen  and back at baseline oxygen  of 4L [place]. Likely multifactorial etiology, including CAP, COPD exacerbation and CHF exacerbation. Continue on IV rocephin , doxy for pneumonia & IV lasix  for CHF exacerbation.    CAP: continue on IV rocephin , doxy, steroids, bronchodilators & encourage incentive spirometry. Strep is neg and legionella is neg. Blood cxs NGTD  COPD exacerbation: w/ wheezing today. Continue on IV steroids, bronchodilators & encourage incentive spirometry    Acute on chronic combined CHF: echo 02/20/2024 showed EF of 40-45%, grade I diastolic dysfunction. Trace leg edema, positive JVD, significantly elevated BNP 5179, consistent with CHF exacerbation. Reduced IV lasix  to daily.Monitor I/Os. Fluid restriction on   Hypokalemia: WNL today    HTN: continue on metoprolol     HLD: continue on statin, zetia      DM2: well controlled, HbA1c 5.7. Continue on SSI w/ accuchecks    CKDIIIa: recent creatinine baseline 1.32. Cr continues to trend down    Gout: continue on home dose of allopurinol     TIA:  continue on statin, plavix , aspirin     Overweight: BMI 26.6. would benefit from weight loss    Chronic pain syndrome: continue on home dose of suboxone , gabapentin    Discharge Instructions  Discharge Instructions     Diet - low sodium heart healthy   Complete by: As  directed    Diet Carb Modified   Complete by: As directed    Discharge instructions   Complete by: As directed    F/u w/ PCP in 1-2 weeks.   Discharge wound care:   Complete by: As directed    Wound care  2 times daily      Comments: Cleanse B feet with soap and water, paint L and R plantar foot calluses with Betadine  2 times daily and all to air dry.  Cover with silicone foam   Increase activity slowly   Complete by: As directed       Allergies as of 11/07/2024       Reactions   Bee Venom Anaphylaxis   Linezolid  Other (See Comments)   Thrombocytopenia, hyperlactatemia   Azithromycin  Itching, Rash        Medication List     STOP taking these medications    Dextromethorphan-guaiFENesin  10-100 MG/5ML liquid       TAKE these medications    acetaminophen  325 MG tablet Commonly known as: TYLENOL  Take 650 mg by mouth every 6 (six) hours as needed.   albuterol  108 (90 Base) MCG/ACT inhaler Commonly known as: VENTOLIN  HFA Inhale 2 puffs into the lungs every 4 (four) hours as needed for wheezing or shortness of breath.   allopurinol  300 MG tablet Commonly known as: ZYLOPRIM  Take 600 mg by mouth daily.   aspirin  EC 81 MG tablet Take 1 tablet by mouth daily.   azelastine  0.1 % nasal spray Commonly known as: ASTELIN  Place 2 sprays into both nostrils 2 (two) times daily as needed.   bisacodyl  5 MG EC tablet Generic drug: bisacodyl  Take 1 tablet (5 mg total) by mouth daily as needed for moderate constipation.   Buprenorphine  HCl-Naloxone  HCl 8-2 MG Film Place 1 Film under the tongue in the morning, at noon, and at bedtime.   clopidogrel  75 MG tablet Commonly known as: PLAVIX  Take 1 tablet (75 mg total) by mouth daily.   dapagliflozin  propanediol 10 MG Tabs tablet Commonly known as: FARXIGA  Take 1 tablet by mouth daily.   doxycycline  100 MG capsule Commonly known as: VIBRAMYCIN  Take 1 capsule (100 mg total) by mouth 2 (two) times daily for 2 days.    DULoxetine  60 MG capsule Commonly known as: CYMBALTA  Take 60 mg by mouth daily. What changed: Another medication with the same name was removed. Continue taking this medication, and follow the directions you see here.   ezetimibe  10 MG tablet Commonly known as: ZETIA  Take 10 mg by mouth daily.   fluticasone  50 MCG/ACT nasal spray Commonly known as: FLONASE  Place 1 spray into both nostrils daily as needed for allergies.   furosemide  20 MG tablet Commonly known as: LASIX  Take 1 tablet (20 mg total) by mouth daily.   gabapentin  800 MG tablet Commonly known as: NEURONTIN  Take 800 mg by mouth 4 (four) times daily.   ipratropium-albuterol  0.5-2.5 (3) MG/3ML Soln Commonly known as: DUONEB Take 3 mLs by nebulization every 6 (six) hours as needed (wheezing).   metFORMIN  1000 MG tablet Commonly known as: GLUCOPHAGE  Take 1,000 mg by mouth daily.   metoprolol  succinate 50 MG 24 hr tablet Commonly known as: TOPROL -XL  Take 50 mg by mouth daily.   MULTIVITAMIN ADULT (MINERALS) PO Take 1 tablet by mouth every morning.   naloxone  4 MG/0.1ML Liqd nasal spray kit Commonly known as: NARCAN  Place 1 spray into the nose once.   Ohtuvayre  3 MG/2.5ML Susp Generic drug: Ensifentrine  Take 5 mLs by mouth daily.   ondansetron  4 MG disintegrating tablet Commonly known as: ZOFRAN -ODT Take 4 mg by mouth every 8 (eight) hours as needed for nausea.   OXYGEN  Inhale 3 L into the lungs.   pantoprazole  40 MG tablet Commonly known as: PROTONIX  Take 40 mg by mouth daily as needed.   predniSONE  20 MG tablet Commonly known as: DELTASONE  60mg  daily x 2 days, 40mg  daily x 2 days, 20mg  daily x 2 days then stop.   PRO-STAT SUGAR FREE PO Take 30 mLs by mouth 2 (two) times daily.   roflumilast  500 MCG Tabs tablet Commonly known as: DALIRESP  Take 500 mcg by mouth daily.   simvastatin  40 MG tablet Commonly known as: ZOCOR  Take 40 mg by mouth at bedtime.   Stomach Relief 525 MG/30ML  suspension Generic drug: bismuth  subsalicylate Take 15 mLs by mouth every 6 (six) hours as needed.   SYRINGE 3CC/25GX1 25G X 1 3 ML Misc 1 Syringe by Does not apply route every 30 (thirty) days.   traZODone  50 MG tablet Commonly known as: DESYREL  Take 50 mg by mouth at bedtime as needed for sleep.   Trelegy Ellipta 100-62.5-25 MCG/ACT Aepb Generic drug: Fluticasone -Umeclidin-Vilant Inhale 1 puff into the lungs in the morning.               Discharge Care Instructions  (From admission, onward)           Start     Ordered   11/07/24 0000  Discharge wound care:       Comments: Wound care  2 times daily      Comments: Cleanse B feet with soap and water, paint L and R plantar foot calluses with Betadine  2 times daily and all to air dry.  Cover with silicone foam   11/07/24 1223            Allergies  Allergen Reactions   Bee Venom Anaphylaxis   Linezolid  Other (See Comments)    Thrombocytopenia, hyperlactatemia   Azithromycin  Itching and Rash    Consultations:    Procedures/Studies: DG Chest Portable 1 View Result Date: 11/03/2024 CLINICAL DATA:  SOB EXAM: PORTABLE CHEST - 1 VIEW COMPARISON:  July 15, 2024 FINDINGS: Emphysema. Hazy airspace opacities in the right mid and lower lung zones. No pleural effusion or pneumothorax. Unchanged small subpleural nodule in the left upper lung zone measuring 1.6 cm. No cardiomegaly. Tortuous aorta with aortic atherosclerosis. No acute fracture or destructive lesions. Multilevel thoracic osteophytosis. IMPRESSION: Emphysema. Hazy airspace opacities in the right mid and lower lung zones, worrisome for bronchopneumonia. Electronically Signed   By: Rogelia Myers M.D.   On: 11/03/2024 19:03   (Echo, Carotid, EGD, Colonoscopy, ERCP)    Subjective: Pt c/o fatigue    Discharge Exam: Vitals:   11/07/24 0728 11/07/24 0902  BP:  137/82  Pulse:  88  Resp:  20  Temp:  97.8 F (36.6 C)  SpO2: 95% 96%   Vitals:    11/07/24 0335 11/07/24 0456 11/07/24 0728 11/07/24 0902  BP: (!) 124/93   137/82  Pulse: 84   88  Resp: 18   20  Temp: 98.6 F (37 C)   97.8 F (36.6  C)  TempSrc:    Oral  SpO2: 100%  95% 96%  Weight:  78 kg    Height:        General: Pt is alert, awake, not in acute distress Cardiovascular: S1/S2 +, no rubs, no gallops Respiratory: decreased breath sounds b/l  Abdominal: Soft, NT, ND, bowel sounds + Extremities: no cyanosis    The results of significant diagnostics from this hospitalization (including imaging, microbiology, ancillary and laboratory) are listed below for reference.     Microbiology: Recent Results (from the past 240 hours)  Culture, blood (Routine X 2) w Reflex to ID Panel     Status: None (Preliminary result)   Collection Time: 11/03/24  9:11 PM   Specimen: BLOOD  Result Value Ref Range Status   Specimen Description BLOOD BLOOD LEFT ARM  Final   Special Requests   Final    BOTTLES DRAWN AEROBIC AND ANAEROBIC Blood Culture adequate volume   Culture   Final    NO GROWTH 4 DAYS Performed at [facility], [address] [place], [place] [address]    Report Status PENDING  Incomplete  Culture, blood (Routine X 2) w Reflex to ID Panel     Status: None (Preliminary result)   Collection Time: 11/03/24  9:11 PM   Specimen: BLOOD  Result Value Ref Range Status   Specimen Description BLOOD BLOOD RIGHT ARM  Final   Special Requests   Final    BOTTLES DRAWN AEROBIC AND ANAEROBIC Blood Culture adequate volume   Culture   Final    NO GROWTH 4 DAYS Performed at [facility], [address], [place], [place] [address]    Report Status PENDING  Incomplete  Resp panel by RT-PCR (RSV, Flu A&B, Covid)     Status: None   Collection Time: 11/03/24  9:11 PM   Specimen: Nasal Swab  Result Value Ref Range Status   SARS Coronavirus 2 by RT PCR NEGATIVE NEGATIVE Final    Comment: (NOTE) SARS-CoV-2 target nucleic acids are NOT DETECTED.  The  SARS-CoV-2 RNA is generally detectable in upper respiratory specimens during the acute phase of infection. The lowest concentration of SARS-CoV-2 viral copies this assay can detect is 138 copies/mL. A negative result does not preclude SARS-Cov-2 infection and should not be used as the sole basis for treatment or other patient management decisions. A negative result may occur with  improper specimen collection/handling, submission of specimen other than nasopharyngeal swab, presence of viral mutation(s) within the areas targeted by this assay, and inadequate number of viral copies(<138 copies/mL). A negative result must be combined with clinical observations, patient history, and epidemiological information. The expected result is Negative.  Fact Sheet for Patients:  bloggercourse.com  Fact Sheet for Healthcare Providers:  seriousbroker.it  This test is no t yet approved or cleared by the [place]  FDA and  has been authorized for detection and/or diagnosis of SARS-CoV-2 by FDA under an Emergency Use Authorization (EUA). This EUA will remain  in effect (meaning this test can be used) for the duration of the COVID-19 declaration under Section 564(b)(1) of the Act, 21 U.S.C.section 360bbb-3(b)(1), unless the authorization is terminated  or revoked sooner.       Influenza A by PCR NEGATIVE NEGATIVE Final   Influenza B by PCR NEGATIVE NEGATIVE Final    Comment: (NOTE) The Xpert Xpress SARS-CoV-2/FLU/RSV plus assay is intended as an aid in the diagnosis of influenza from Nasopharyngeal swab specimens and should not be used as  a sole basis for treatment. Nasal washings and aspirates are unacceptable for Xpert Xpress SARS-CoV-2/FLU/RSV testing.  Fact Sheet for Patients: bloggercourse.com  Fact Sheet for Healthcare Providers: seriousbroker.it  This test is not yet approved or  cleared by the [place]  FDA and has been authorized for detection and/or diagnosis of SARS-CoV-2 by FDA under an Emergency Use Authorization (EUA). This EUA will remain in effect (meaning this test can be used) for the duration of the COVID-19 declaration under Section 564(b)(1) of the Act, 21 U.S.C. section 360bbb-3(b)(1), unless the authorization is terminated or revoked.     Resp Syncytial Virus by PCR NEGATIVE NEGATIVE Final    Comment: (NOTE) Fact Sheet for Patients: bloggercourse.com  Fact Sheet for Healthcare Providers: seriousbroker.it  This test is not yet approved or cleared by the [place]  FDA and has been authorized for detection and/or diagnosis of SARS-CoV-2 by FDA under an Emergency Use Authorization (EUA). This EUA will remain in effect (meaning this test can be used) for the duration of the COVID-19 declaration under Section 564(b)(1) of the Act, 21 U.S.C. section 360bbb-3(b)(1), unless the authorization is terminated or revoked.  Performed at [facility], [address] [place], [place] [address]   MRSA Next Gen by PCR, Nasal     Status: None   Collection Time: 11/05/24  5:17 AM   Specimen: Nasal Mucosa; Nasal Swab  Result Value Ref Range Status   MRSA by PCR Next Gen NOT DETECTED NOT DETECTED Final    Comment: (NOTE) The GeneXpert MRSA Assay (FDA approved for NASAL specimens only), is one component of a comprehensive MRSA colonization surveillance program. It is not intended to diagnose MRSA infection nor to guide or monitor treatment for MRSA infections. Test performance is not FDA approved in patients less than 109 years old. Performed at [facility], [address] [place], [place] [address]      Labs: BNP (last 3 results) Recent Labs    02/06/24 1730 02/19/24 1914  BNP 215.9* 284.6*   Basic Metabolic Panel: Recent Labs  Lab 11/03/24 1850 11/04/24 0329  11/05/24 0503 11/06/24 0538 11/07/24 0524  NA 137 139 141 140 141  K 4.3 4.3 3.4* 3.9 3.7  CL 99 99 98 95* 96*  CO2 25 27 31  34* 36*  GLUCOSE 153* 213* 295* 154* 87  BUN 29* 27* 25* 31* 34*  CREATININE 1.82* 1.68* 1.41* 1.26* 1.09  CALCIUM  8.8* 8.5* 8.3* 8.4* 8.3*   Liver Function Tests: No results for input(s): AST, ALT, ALKPHOS, BILITOT, PROT, ALBUMIN  in the last 168 hours. No results for input(s): LIPASE, AMYLASE in the last 168 hours. No results for input(s): AMMONIA in the last 168 hours. CBC: Recent Labs  Lab 11/03/24 1850 11/04/24 0329 11/05/24 0503 11/06/24 0538 11/07/24 0524  WBC 8.6 5.4 12.8* 19.5* 20.6*  HGB 11.2* 10.7* 11.3* 11.9* 11.9*  HCT 37.8* 34.9* 36.3* 38.9* 39.7  MCV 95.0 94.1 91.7 91.7 93.9  PLT 196 179 209 289 312   Cardiac Enzymes: No results for input(s): CKTOTAL, CKMB, CKMBINDEX, TROPONINI in the last 168 hours. BNP: Invalid input(s): POCBNP CBG: Recent Labs  Lab 11/06/24 1704 11/06/24 1949 11/06/24 2208 11/06/24 2326 11/07/24 0905  GLUCAP 318* 447* 439* 321* 106*   D-Dimer No results for input(s): DDIMER in the last 72 hours. Hgb A1c No results for input(s): HGBA1C in the last 72 hours. Lipid Profile No results for input(s): CHOL, HDL, LDLCALC, TRIG, CHOLHDL, LDLDIRECT in the last 72 hours. Thyroid  function studies  No results for input(s): TSH, T4TOTAL, T3FREE, THYROIDAB in the last 72 hours.  Invalid input(s): FREET3 Anemia work up No results for input(s): VITAMINB12, FOLATE, FERRITIN, TIBC, IRON , RETICCTPCT in the last 72 hours. Urinalysis    Component Value Date/Time   COLORURINE YELLOW (A) 07/02/2023 1406   APPEARANCEUR CLEAR (A) 07/02/2023 1406   APPEARANCEUR Clear 03/24/2013 1853   LABSPEC 1.009 07/02/2023 1406   LABSPEC 1.020 03/24/2013 1853   PHURINE 6.0 07/02/2023 1406   GLUCOSEU NEGATIVE 07/02/2023 1406   GLUCOSEU Negative 03/24/2013 1853   HGBUR  LARGE (A) 07/02/2023 1406   BILIRUBINUR NEGATIVE 07/02/2023 1406   BILIRUBINUR Negative 03/24/2013 1853   KETONESUR NEGATIVE 07/02/2023 1406   PROTEINUR NEGATIVE 07/02/2023 1406   NITRITE NEGATIVE 07/02/2023 1406   LEUKOCYTESUR NEGATIVE 07/02/2023 1406   LEUKOCYTESUR Negative 03/24/2013 1853   Sepsis Labs Recent Labs  Lab 11/04/24 0329 11/05/24 0503 11/06/24 0538 11/07/24 0524  WBC 5.4 12.8* 19.5* 20.6*   Microbiology Recent Results (from the past 240 hours)  Culture, blood (Routine X 2) w Reflex to ID Panel     Status: None (Preliminary result)   Collection Time: 11/03/24  9:11 PM   Specimen: BLOOD  Result Value Ref Range Status   Specimen Description BLOOD BLOOD LEFT ARM  Final   Special Requests   Final    BOTTLES DRAWN AEROBIC AND ANAEROBIC Blood Culture adequate volume   Culture   Final    NO GROWTH 4 DAYS Performed at [facility], [address], [place], [place] [address]    Report Status PENDING  Incomplete  Culture, blood (Routine X 2) w Reflex to ID Panel     Status: None (Preliminary result)   Collection Time: 11/03/24  9:11 PM   Specimen: BLOOD  Result Value Ref Range Status   Specimen Description BLOOD BLOOD RIGHT ARM  Final   Special Requests   Final    BOTTLES DRAWN AEROBIC AND ANAEROBIC Blood Culture adequate volume   Culture   Final    NO GROWTH 4 DAYS Performed at [facility], [address] [place], [place] [address]    Report Status PENDING  Incomplete  Resp panel by RT-PCR (RSV, Flu A&B, Covid)     Status: None   Collection Time: 11/03/24  9:11 PM   Specimen: Nasal Swab  Result Value Ref Range Status   SARS Coronavirus 2 by RT PCR NEGATIVE NEGATIVE Final    Comment: (NOTE) SARS-CoV-2 target nucleic acids are NOT DETECTED.  The SARS-CoV-2 RNA is generally detectable in upper respiratory specimens during the acute phase of infection. The lowest concentration of SARS-CoV-2 viral copies this assay can detect is 138  copies/mL. A negative result does not preclude SARS-Cov-2 infection and should not be used as the sole basis for treatment or other patient management decisions. A negative result may occur with  improper specimen collection/handling, submission of specimen other than nasopharyngeal swab, presence of viral mutation(s) within the areas targeted by this assay, and inadequate number of viral copies(<138 copies/mL). A negative result must be combined with clinical observations, patient history, and epidemiological information. The expected result is Negative.  Fact Sheet for Patients:  bloggercourse.com  Fact Sheet for Healthcare Providers:  seriousbroker.it  This test is no t yet approved or cleared by the [place]  FDA and  has been authorized for detection and/or diagnosis of SARS-CoV-2 by FDA under an Emergency Use Authorization (EUA). This EUA will remain  in effect (meaning this test can be  used) for the duration of the COVID-19 declaration under Section 564(b)(1) of the Act, 21 U.S.C.section 360bbb-3(b)(1), unless the authorization is terminated  or revoked sooner.       Influenza A by PCR NEGATIVE NEGATIVE Final   Influenza B by PCR NEGATIVE NEGATIVE Final    Comment: (NOTE) The Xpert Xpress SARS-CoV-2/FLU/RSV plus assay is intended as an aid in the diagnosis of influenza from Nasopharyngeal swab specimens and should not be used as a sole basis for treatment. Nasal washings and aspirates are unacceptable for Xpert Xpress SARS-CoV-2/FLU/RSV testing.  Fact Sheet for Patients: bloggercourse.com  Fact Sheet for Healthcare Providers: seriousbroker.it  This test is not yet approved or cleared by the [place]  FDA and has been authorized for detection and/or diagnosis of SARS-CoV-2 by FDA under an Emergency Use Authorization (EUA). This EUA will remain in effect (meaning  this test can be used) for the duration of the COVID-19 declaration under Section 564(b)(1) of the Act, 21 U.S.C. section 360bbb-3(b)(1), unless the authorization is terminated or revoked.     Resp Syncytial Virus by PCR NEGATIVE NEGATIVE Final    Comment: (NOTE) Fact Sheet for Patients: bloggercourse.com  Fact Sheet for Healthcare Providers: seriousbroker.it  This test is not yet approved or cleared by the [place]  FDA and has been authorized for detection and/or diagnosis of SARS-CoV-2 by FDA under an Emergency Use Authorization (EUA). This EUA will remain in effect (meaning this test can be used) for the duration of the COVID-19 declaration under Section 564(b)(1) of the Act, 21 U.S.C. section 360bbb-3(b)(1), unless the authorization is terminated or revoked.  Performed at [facility], [address] [place], [place] [address]   MRSA Next Gen by PCR, Nasal     Status: None   Collection Time: 11/05/24  5:17 AM   Specimen: Nasal Mucosa; Nasal Swab  Result Value Ref Range Status   MRSA by PCR Next Gen NOT DETECTED NOT DETECTED Final    Comment: (NOTE) The GeneXpert MRSA Assay (FDA approved for NASAL specimens only), is one component of a comprehensive MRSA colonization surveillance program. It is not intended to diagnose MRSA infection nor to guide or monitor treatment for MRSA infections. Test performance is not FDA approved in patients less than 23 years old. Performed at [facility], [address] [place], [place] [address]      Time coordinating discharge: 35 minutes  SIGNED:   Anthony CHRISTELLA Pouch, MD  [facility] 11/07/2024, 12:25 PM Pager   If 7PM-7AM, please contact night-coverage [email or web address]

## 2024-11-08 ENCOUNTER — Other Ambulatory Visit: Payer: Self-pay

## 2024-11-08 LAB — CULTURE, BLOOD (ROUTINE X 2)
Culture: NO GROWTH
Culture: NO GROWTH
Special Requests: ADEQUATE
Special Requests: ADEQUATE

## 2024-11-12 ENCOUNTER — Inpatient Hospital Stay
Admission: EM | Admit: 2024-11-12 | Discharge: 2024-12-09 | DRG: 207 | Disposition: E | Attending: Internal Medicine | Admitting: Internal Medicine

## 2024-11-12 ENCOUNTER — Other Ambulatory Visit: Payer: Self-pay

## 2024-11-12 ENCOUNTER — Emergency Department

## 2024-11-12 DIAGNOSIS — I959 Hypotension, unspecified: Secondary | ICD-10-CM | POA: Diagnosis present

## 2024-11-12 DIAGNOSIS — G894 Chronic pain syndrome: Secondary | ICD-10-CM | POA: Diagnosis present

## 2024-11-12 DIAGNOSIS — Z604 Social exclusion and rejection: Secondary | ICD-10-CM | POA: Diagnosis present

## 2024-11-12 DIAGNOSIS — E1165 Type 2 diabetes mellitus with hyperglycemia: Secondary | ICD-10-CM | POA: Diagnosis present

## 2024-11-12 DIAGNOSIS — Z79899 Other long term (current) drug therapy: Secondary | ICD-10-CM

## 2024-11-12 DIAGNOSIS — Z7902 Long term (current) use of antithrombotics/antiplatelets: Secondary | ICD-10-CM

## 2024-11-12 DIAGNOSIS — Z66 Do not resuscitate: Secondary | ICD-10-CM | POA: Diagnosis present

## 2024-11-12 DIAGNOSIS — J441 Chronic obstructive pulmonary disease with (acute) exacerbation: Principal | ICD-10-CM | POA: Diagnosis present

## 2024-11-12 DIAGNOSIS — J111 Influenza due to unidentified influenza virus with other respiratory manifestations: Secondary | ICD-10-CM | POA: Diagnosis not present

## 2024-11-12 DIAGNOSIS — Z7189 Other specified counseling: Secondary | ICD-10-CM | POA: Diagnosis not present

## 2024-11-12 DIAGNOSIS — B952 Enterococcus as the cause of diseases classified elsewhere: Secondary | ICD-10-CM | POA: Diagnosis present

## 2024-11-12 DIAGNOSIS — K219 Gastro-esophageal reflux disease without esophagitis: Secondary | ICD-10-CM | POA: Diagnosis present

## 2024-11-12 DIAGNOSIS — F17210 Nicotine dependence, cigarettes, uncomplicated: Secondary | ICD-10-CM | POA: Diagnosis present

## 2024-11-12 DIAGNOSIS — E785 Hyperlipidemia, unspecified: Secondary | ICD-10-CM | POA: Diagnosis present

## 2024-11-12 DIAGNOSIS — J122 Parainfluenza virus pneumonia: Principal | ICD-10-CM | POA: Diagnosis present

## 2024-11-12 DIAGNOSIS — C3490 Malignant neoplasm of unspecified part of unspecified bronchus or lung: Secondary | ICD-10-CM | POA: Diagnosis not present

## 2024-11-12 DIAGNOSIS — J440 Chronic obstructive pulmonary disease with acute lower respiratory infection: Secondary | ICD-10-CM | POA: Diagnosis present

## 2024-11-12 DIAGNOSIS — I130 Hypertensive heart and chronic kidney disease with heart failure and stage 1 through stage 4 chronic kidney disease, or unspecified chronic kidney disease: Secondary | ICD-10-CM | POA: Diagnosis present

## 2024-11-12 DIAGNOSIS — N1831 Chronic kidney disease, stage 3a: Secondary | ICD-10-CM | POA: Diagnosis present

## 2024-11-12 DIAGNOSIS — E1122 Type 2 diabetes mellitus with diabetic chronic kidney disease: Secondary | ICD-10-CM | POA: Diagnosis present

## 2024-11-12 DIAGNOSIS — Z9049 Acquired absence of other specified parts of digestive tract: Secondary | ICD-10-CM

## 2024-11-12 DIAGNOSIS — Z85118 Personal history of other malignant neoplasm of bronchus and lung: Secondary | ICD-10-CM

## 2024-11-12 DIAGNOSIS — Z9911 Dependence on respirator [ventilator] status: Secondary | ICD-10-CM | POA: Diagnosis not present

## 2024-11-12 DIAGNOSIS — Z8673 Personal history of transient ischemic attack (TIA), and cerebral infarction without residual deficits: Secondary | ICD-10-CM

## 2024-11-12 DIAGNOSIS — Z794 Long term (current) use of insulin: Secondary | ICD-10-CM

## 2024-11-12 DIAGNOSIS — Z8249 Family history of ischemic heart disease and other diseases of the circulatory system: Secondary | ICD-10-CM

## 2024-11-12 DIAGNOSIS — J690 Pneumonitis due to inhalation of food and vomit: Secondary | ICD-10-CM | POA: Diagnosis not present

## 2024-11-12 DIAGNOSIS — Z923 Personal history of irradiation: Secondary | ICD-10-CM

## 2024-11-12 DIAGNOSIS — G9340 Encephalopathy, unspecified: Secondary | ICD-10-CM | POA: Diagnosis not present

## 2024-11-12 DIAGNOSIS — R7989 Other specified abnormal findings of blood chemistry: Secondary | ICD-10-CM | POA: Diagnosis not present

## 2024-11-12 DIAGNOSIS — C3492 Malignant neoplasm of unspecified part of left bronchus or lung: Secondary | ICD-10-CM | POA: Diagnosis present

## 2024-11-12 DIAGNOSIS — Z7952 Long term (current) use of systemic steroids: Secondary | ICD-10-CM

## 2024-11-12 DIAGNOSIS — F32A Depression, unspecified: Secondary | ICD-10-CM | POA: Diagnosis present

## 2024-11-12 DIAGNOSIS — F419 Anxiety disorder, unspecified: Secondary | ICD-10-CM | POA: Diagnosis present

## 2024-11-12 DIAGNOSIS — J189 Pneumonia, unspecified organism: Secondary | ICD-10-CM | POA: Diagnosis not present

## 2024-11-12 DIAGNOSIS — E1140 Type 2 diabetes mellitus with diabetic neuropathy, unspecified: Secondary | ICD-10-CM | POA: Diagnosis present

## 2024-11-12 DIAGNOSIS — Z91030 Bee allergy status: Secondary | ICD-10-CM

## 2024-11-12 DIAGNOSIS — E119 Type 2 diabetes mellitus without complications: Secondary | ICD-10-CM

## 2024-11-12 DIAGNOSIS — J9601 Acute respiratory failure with hypoxia: Secondary | ICD-10-CM | POA: Diagnosis not present

## 2024-11-12 DIAGNOSIS — C3491 Malignant neoplasm of unspecified part of right bronchus or lung: Secondary | ICD-10-CM | POA: Diagnosis not present

## 2024-11-12 DIAGNOSIS — R000 Tachycardia, unspecified: Secondary | ICD-10-CM | POA: Diagnosis not present

## 2024-11-12 DIAGNOSIS — Z9981 Dependence on supplemental oxygen: Secondary | ICD-10-CM

## 2024-11-12 DIAGNOSIS — Z87442 Personal history of urinary calculi: Secondary | ICD-10-CM

## 2024-11-12 DIAGNOSIS — G4730 Sleep apnea, unspecified: Secondary | ICD-10-CM | POA: Diagnosis present

## 2024-11-12 DIAGNOSIS — Z86718 Personal history of other venous thrombosis and embolism: Secondary | ICD-10-CM

## 2024-11-12 DIAGNOSIS — Z85828 Personal history of other malignant neoplasm of skin: Secondary | ICD-10-CM

## 2024-11-12 DIAGNOSIS — Z515 Encounter for palliative care: Secondary | ICD-10-CM | POA: Diagnosis not present

## 2024-11-12 DIAGNOSIS — Z91199 Patient's noncompliance with other medical treatment and regimen due to unspecified reason: Secondary | ICD-10-CM

## 2024-11-12 DIAGNOSIS — N179 Acute kidney failure, unspecified: Secondary | ICD-10-CM | POA: Diagnosis present

## 2024-11-12 DIAGNOSIS — J9612 Chronic respiratory failure with hypercapnia: Secondary | ICD-10-CM | POA: Diagnosis not present

## 2024-11-12 DIAGNOSIS — J159 Unspecified bacterial pneumonia: Secondary | ICD-10-CM | POA: Diagnosis not present

## 2024-11-12 DIAGNOSIS — Z881 Allergy status to other antibiotic agents status: Secondary | ICD-10-CM

## 2024-11-12 DIAGNOSIS — I5032 Chronic diastolic (congestive) heart failure: Secondary | ICD-10-CM | POA: Diagnosis present

## 2024-11-12 DIAGNOSIS — Z888 Allergy status to other drugs, medicaments and biological substances status: Secondary | ICD-10-CM

## 2024-11-12 DIAGNOSIS — Z7984 Long term (current) use of oral hypoglycemic drugs: Secondary | ICD-10-CM

## 2024-11-12 DIAGNOSIS — Z7982 Long term (current) use of aspirin: Secondary | ICD-10-CM

## 2024-11-12 DIAGNOSIS — I120 Hypertensive chronic kidney disease with stage 5 chronic kidney disease or end stage renal disease: Secondary | ICD-10-CM | POA: Diagnosis not present

## 2024-11-12 DIAGNOSIS — Z79891 Long term (current) use of opiate analgesic: Secondary | ICD-10-CM

## 2024-11-12 DIAGNOSIS — R0603 Acute respiratory distress: Secondary | ICD-10-CM | POA: Diagnosis not present

## 2024-11-12 DIAGNOSIS — J9621 Acute and chronic respiratory failure with hypoxia: Secondary | ICD-10-CM | POA: Diagnosis present

## 2024-11-12 DIAGNOSIS — J9622 Acute and chronic respiratory failure with hypercapnia: Secondary | ICD-10-CM | POA: Diagnosis present

## 2024-11-12 DIAGNOSIS — I10 Essential (primary) hypertension: Secondary | ICD-10-CM | POA: Diagnosis present

## 2024-11-12 DIAGNOSIS — J158 Pneumonia due to other specified bacteria: Secondary | ICD-10-CM | POA: Diagnosis not present

## 2024-11-12 LAB — COMPREHENSIVE METABOLIC PANEL WITH GFR
ALT: 26 U/L (ref 0–44)
AST: 19 U/L (ref 15–41)
Albumin: 4.2 g/dL (ref 3.5–5.0)
Alkaline Phosphatase: 113 U/L (ref 38–126)
Anion gap: 9 (ref 5–15)
BUN: 38 mg/dL — ABNORMAL HIGH (ref 8–23)
CO2: 33 mmol/L — ABNORMAL HIGH (ref 22–32)
Calcium: 9.2 mg/dL (ref 8.9–10.3)
Chloride: 97 mmol/L — ABNORMAL LOW (ref 98–111)
Creatinine, Ser: 1.25 mg/dL — ABNORMAL HIGH (ref 0.61–1.24)
GFR, Estimated: >60 mL/min (ref >60)
Glucose, Bld: 191 mg/dL — ABNORMAL HIGH (ref 70–99)
Potassium: 4.5 mmol/L (ref 3.5–5.1)
Sodium: 139 mmol/L (ref 135–145)
Total Bilirubin: 0.3 mg/dL (ref 0.0–1.2)
Total Protein: 7.1 g/dL (ref 6.5–8.1)

## 2024-11-12 LAB — CBC
HCT: 45 % (ref 39.0–52.0)
Hemoglobin: 13.5 g/dL (ref 13.0–17.0)
MCH: 28.8 pg (ref 26.0–34.0)
MCHC: 30 g/dL (ref 30.0–36.0)
MCV: 95.9 fL (ref 80.0–100.0)
Platelets: 288 K/uL (ref 150–400)
RBC: 4.69 MIL/uL (ref 4.22–5.81)
RDW: 16.2 % — ABNORMAL HIGH (ref 11.5–15.5)
WBC: 25.5 K/uL — ABNORMAL HIGH (ref 4.0–10.5)
nRBC: 0 % (ref 0.0–0.2)

## 2024-11-12 LAB — RESP PANEL BY RT-PCR (RSV, FLU A&B, COVID)  RVPGX2
Influenza A by PCR: NEGATIVE
Influenza B by PCR: NEGATIVE
Resp Syncytial Virus by PCR: NEGATIVE
SARS Coronavirus 2 by RT PCR: NEGATIVE

## 2024-11-12 LAB — BLOOD GAS, VENOUS
Acid-Base Excess: 8.7 mmol/L — ABNORMAL HIGH (ref 0.0–2.0)
Bicarbonate: 38.5 mmol/L — ABNORMAL HIGH (ref 20.0–28.0)
O2 Saturation: 56.4 %
Patient temperature: 37
pCO2, Ven: 80 mmHg (ref 44–60)
pH, Ven: 7.29 (ref 7.25–7.43)
pO2, Ven: 36 mmHg (ref 32–45)

## 2024-11-12 LAB — GLUCOSE, CAPILLARY: Glucose-Capillary: 235 mg/dL — ABNORMAL HIGH (ref 70–99)

## 2024-11-12 LAB — TROPONIN T, HIGH SENSITIVITY
Troponin T High Sensitivity: 31 ng/L — ABNORMAL HIGH (ref 0–19)
Troponin T High Sensitivity: 31 ng/L — ABNORMAL HIGH (ref 0–19)

## 2024-11-12 LAB — MRSA NEXT GEN BY PCR, NASAL: MRSA by PCR Next Gen: NOT DETECTED

## 2024-11-12 LAB — C-REACTIVE PROTEIN: CRP: 2.3 mg/dL — ABNORMAL HIGH (ref <1.0)

## 2024-11-12 MED ORDER — EZETIMIBE 10 MG PO TABS
10.0000 mg | ORAL_TABLET | Freq: Every day | ORAL | Status: DC
  Administered 2024-11-13 – 2024-11-14 (×2): 10 mg via ORAL
  Filled 2024-11-12 (×2): qty 1

## 2024-11-12 MED ORDER — ONDANSETRON HCL 4 MG/2ML IJ SOLN
4.0000 mg | Freq: Four times a day (QID) | INTRAMUSCULAR | Status: DC | PRN
  Administered 2024-11-14 – 2024-11-15 (×2): 4 mg via INTRAVENOUS
  Filled 2024-11-12 (×2): qty 2

## 2024-11-12 MED ORDER — METHYLPREDNISOLONE SODIUM SUCC 40 MG IJ SOLR
40.0000 mg | Freq: Two times a day (BID) | INTRAMUSCULAR | Status: DC
  Administered 2024-11-12: 40 mg via INTRAVENOUS
  Filled 2024-11-12: qty 1

## 2024-11-12 MED ORDER — VANCOMYCIN HCL 1750 MG/350ML IV SOLN
1750.0000 mg | Freq: Once | INTRAVENOUS | Status: DC
  Filled 2024-11-12: qty 350

## 2024-11-12 MED ORDER — OXYCODONE HCL 5 MG PO TABS: 5.0000 mg | ORAL_TABLET | ORAL | Status: DC | PRN

## 2024-11-12 MED ORDER — IPRATROPIUM-ALBUTEROL 0.5-2.5 (3) MG/3ML IN SOLN
3.0000 mL | RESPIRATORY_TRACT | Status: DC | PRN
  Administered 2024-11-17: 3 mL via RESPIRATORY_TRACT

## 2024-11-12 MED ORDER — POLYETHYLENE GLYCOL 3350 17 G PO PACK: 17.0000 g | PACK | Freq: Every day | ORAL | Status: DC | PRN

## 2024-11-12 MED ORDER — VANCOMYCIN HCL 1250 MG/250ML IV SOLN: 1250.0000 mg | INTRAVENOUS | Status: DC

## 2024-11-12 MED ORDER — ONDANSETRON HCL 4 MG PO TABS: 4.0000 mg | ORAL_TABLET | Freq: Four times a day (QID) | ORAL | Status: DC | PRN

## 2024-11-12 MED ORDER — PREDNISONE 20 MG PO TABS: 40.0000 mg | ORAL_TABLET | Freq: Every day | ORAL | Status: DC

## 2024-11-12 MED ORDER — ENOXAPARIN SODIUM 40 MG/0.4ML IJ SOSY
40.0000 mg | PREFILLED_SYRINGE | INTRAMUSCULAR | Status: DC
  Administered 2024-11-12 – 2024-11-24 (×13): 40 mg via SUBCUTANEOUS
  Filled 2024-11-12 (×15): qty 0.4

## 2024-11-12 MED ORDER — PIPERACILLIN-TAZOBACTAM 3.375 G IVPB
3.3750 g | Freq: Three times a day (TID) | INTRAVENOUS | Status: DC
  Administered 2024-11-13: 3.375 g via INTRAVENOUS
  Filled 2024-11-12: qty 50

## 2024-11-12 MED ORDER — TRAZODONE HCL 50 MG PO TABS
50.0000 mg | ORAL_TABLET | Freq: Every evening | ORAL | Status: DC | PRN
  Administered 2024-11-13 – 2024-11-14 (×2): 50 mg via ORAL
  Filled 2024-11-12 (×2): qty 1

## 2024-11-12 MED ORDER — SODIUM CHLORIDE 0.9 % IV BOLUS
250.0000 mL | Freq: Once | INTRAVENOUS | Status: AC
  Administered 2024-11-12: 250 mL via INTRAVENOUS

## 2024-11-12 MED ORDER — IPRATROPIUM-ALBUTEROL 0.5-2.5 (3) MG/3ML IN SOLN
3.0000 mL | Freq: Once | RESPIRATORY_TRACT | Status: AC
  Administered 2024-11-12: 3 mL via RESPIRATORY_TRACT
  Filled 2024-11-12: qty 3

## 2024-11-12 MED ORDER — DULOXETINE HCL 30 MG PO CPEP
60.0000 mg | ORAL_CAPSULE | Freq: Every day | ORAL | Status: DC
  Administered 2024-11-13 – 2024-11-14 (×2): 60 mg via ORAL
  Filled 2024-11-12 (×2): qty 2

## 2024-11-12 MED ORDER — PANTOPRAZOLE SODIUM 40 MG IV SOLR
40.0000 mg | Freq: Two times a day (BID) | INTRAVENOUS | Status: DC
  Administered 2024-11-12 – 2024-11-13 (×2): 40 mg via INTRAVENOUS
  Filled 2024-11-12 (×2): qty 10

## 2024-11-12 MED ORDER — ASPIRIN 81 MG PO TBEC
81.0000 mg | DELAYED_RELEASE_TABLET | Freq: Every day | ORAL | Status: DC
  Administered 2024-11-13 – 2024-11-14 (×2): 81 mg via ORAL
  Filled 2024-11-12 (×2): qty 1

## 2024-11-12 MED ORDER — METHYLPREDNISOLONE SODIUM SUCC 40 MG IJ SOLR
40.0000 mg | Freq: Two times a day (BID) | INTRAMUSCULAR | Status: AC
  Administered 2024-11-13 – 2024-11-14 (×3): 40 mg via INTRAVENOUS
  Filled 2024-11-12 (×3): qty 1

## 2024-11-12 MED ORDER — MORPHINE SULFATE (PF) 2 MG/ML IV SOLN: 2.0000 mg | INTRAVENOUS | Status: DC | PRN

## 2024-11-12 MED ORDER — SODIUM CHLORIDE 0.9 % IV SOLN
2.0000 g | Freq: Two times a day (BID) | INTRAVENOUS | Status: DC
  Administered 2024-11-12: 2 g via INTRAVENOUS
  Filled 2024-11-12 (×2): qty 12.5

## 2024-11-12 MED ORDER — ACETAMINOPHEN 650 MG RE SUPP: 650.0000 mg | Freq: Four times a day (QID) | RECTAL | Status: DC | PRN

## 2024-11-12 MED ORDER — CLOPIDOGREL BISULFATE 75 MG PO TABS
75.0000 mg | ORAL_TABLET | Freq: Every day | ORAL | Status: DC
  Administered 2024-11-13 – 2024-11-14 (×2): 75 mg via ORAL
  Filled 2024-11-12 (×2): qty 1

## 2024-11-12 MED ORDER — ACETAMINOPHEN 325 MG PO TABS: 650.0000 mg | ORAL_TABLET | Freq: Four times a day (QID) | ORAL | Status: DC | PRN

## 2024-11-12 NOTE — ED Triage Notes (Signed)
 Patient complaining of shortness of breath since last night; was recently discharged from [facility] for Pneumonia and has completed his antibiotics.   18G RFA Initial O2 sat 60-70's  3 Duonebs Albuterol  2g Mag 125mg  Solumedrol

## 2024-11-12 NOTE — ED Provider Notes (Addendum)
 [facility] Provider Note    Event Date/Time   First MD Initiated Contact with Patient 11/12/24 1401     (approximate)  History   Chief Complaint: Shortness of Breath  HPI  Christian Fields is a 68 y.o. male with a past medical history of anemia, anxiety, COPD, diabetes, gastric reflux, hypertension, hyperlipidemia on 4 L chronically who presents to the emergency department for worsening shortness of breath.  Cording to the patient for the last day or so he has been experiencing worsening shortness of breath.  Patient states he was recently discharged in the hospital after completing a treatment for pneumonia.  Since going home patient states initially he was doing better but now has become short of breath once again.  EMS states initial saturations around 70% on 4 L.  Patient placed on nonrebreather was given 125 mg of Solu-Medrol , 2 g of magnesium  placed on DuoNebs and brought to the emergency department.  Here patient states he is feeling a little better says the breathing is somewhat improved.  Denies any known fever.  Patient is quite tachypneic in the mid to upper 30s tachycardic.  Patient has diminished breath sounds bilaterally with wheeze present.  Physical Exam   Triage Vital Signs: ED Triage Vitals  Encounter Vitals Group     BP 11/12/24 1354 125/61     Girls Systolic BP Percentile --      Girls Diastolic BP Percentile --      Boys Systolic BP Percentile --      Boys Diastolic BP Percentile --      Pulse Rate 11/12/24 1354 (!) 119     Resp 11/12/24 1354 (!) 26     Temp 11/12/24 1354 98.5 F (36.9 C)     Temp Source 11/12/24 1354 Oral     SpO2 11/12/24 1354 100 %     Weight 11/12/24 1353 169 lb 12.1 oz (77 kg)     Height 11/12/24 1353 5' 6 (1.676 m)     Head Circumference --      Peak Flow --      Pain Score 11/12/24 1352 0     Pain Loc --      Pain Education --      Exclude from Growth Chart --     Most recent vital signs: Vitals:    11/12/24 1354  BP: 125/61  Pulse: (!) 119  Resp: (!) 26  Temp: 98.5 F (36.9 C)  SpO2: 100%    General: Awake, mild respiratory distress sitting upright in bed. CV:  Good peripheral perfusion.  Regular rate and rhythm  Resp:  Mild respiratory distress moderate tachypnea, decreased breath sounds bilaterally. Abd:  No distention.  Soft, nontender.  No rebound or guarding.  ED Results / Procedures / Treatments   EKG  EKG viewed and interpreted by myself shows sinus tachycardia 116 bpm with a narrow QRS, normal axis, largely normal intervals and nonspecific ST changes.  Respiratory interference.  RADIOLOGY  I have reviewed and interpreted the chest x-ray images.  No obvious consolidation on my evaluation.  Lungs appear to be improved from prior x-ray November 26   MEDICATIONS ORDERED IN ED: Medications  ipratropium-albuterol  (DUONEB) 0.5-2.5 (3) MG/3ML nebulizer solution 3 mL (has no administration in time range)     IMPRESSION / MDM / ASSESSMENT AND PLAN / ED COURSE  I reviewed the triage vital signs and the nursing notes.  Patient's presentation is most consistent with acute presentation with potential  threat to life or bodily function.  Patient presents emergency department for worsening shortness of breath.  I reviewed the patient's discharge summary from 11/07/2024 patient was admitted to the emergency department for worsening shortness of breath.  Completed a course of antibiotics cefdinir and doxycycline .  Patient also has COPD.  Patient's presentation today could be more consistent with COPD versus pneumonia.  Differential would also include ACS pneumothorax.  We will check labs we will obtain a chest x-ray.  On reassessment patient appears to be worsening.  He is somnolent but he awakens to voice he answers questions appropriately.  We will place the patient on BiPAP and obtain a VBG.  Patient will require admission to the hospital service once remainder of his workup has  been completed.  VBG shows a pCO2 of 80 patient is now on BiPAP.  Patient remains awake alert mentating well.  CRITICAL CARE Performed by: Franky Moores   Total critical care time: 30 minutes  Critical care time was exclusive of separately billable procedures and treating other patients.  Critical care was necessary to treat or prevent imminent or life-threatening deterioration.  Critical care was time spent personally by me on the following activities: development of treatment plan with patient and/or surrogate as well as nursing, discussions with consultants, evaluation of patient's response to treatment, examination of patient, obtaining history from patient or surrogate, ordering and performing treatments and interventions, ordering and review of laboratory studies, ordering and review of radiographic studies, pulse oximetry and re-evaluation of patient's condition.   FINAL CLINICAL IMPRESSION(S) / ED DIAGNOSES   Dyspnea COPD exacerbation  Note:  This document was prepared using Dragon voice recognition software and may include unintentional dictation errors.   Moores Franky, MD 11/12/24 1527    Moores Franky, MD 11/12/24 [phone]

## 2024-11-12 NOTE — H&P (Signed)
 History and Physical    LAVAL CAFARO FMW:[id number] DOB: 09-Dec-1956 DOA: 11/12/2024  DOS: the patient was seen and examined on 11/12/2024  PCP: Lora Odor, FNP   Patient coming from: Home  I have personally briefly reviewed patient's old medical records in [facility]  Chief Complaint: Worsening shortness of breath  HPI: Christian Fields is a pleasant 68 y.o. male with medical history significant for COPD on home oxygen  4 L, non-small cell lung cancer s/p RXT, OSA on CPAP, GERD, history of TIA, asthma, diabetes type 2, HLD, HTN, recent history of admission and discharge from [facility] between 11/03/2024 to 11/07/2024 after being treated for COPD exacerbation brought in today for shortness of breath started last night.  He stated that he finished his antibiotics cefdinir and doxycycline   couple days ago.  EMS gave him 3 DuoNebs, 2 g of magnesium  and 125 mg of Solu-Medrol  IV.  Initial oxygen  was 60 to 70% on 4 L of nasal cannula oxygen .  Patient was placed on nonrebreather.  Patient stated that he was feeling better and his breathing was somewhat improved.  He denied any fever, chills, nausea, vomiting.  He was tachypneic in the mid to upper 30s.  Patient sounds were diminished bilaterally with extensive wheezes.  ED Course: Upon arrival to the ED, patient is found to be tachypneic, tachycardic and with increased work of breathing.  Chest x-ray did not show obvious consolidation.  VBG showed pCO2 of 80 and was placed on BiPAP.  Patient was given nebulizers and hospitalist service was consulted for evaluation for admission.  Review of Systems:  ROS  All other systems negative except as noted in the HPI.  Past Medical History:  Diagnosis Date   Anemia    Anxiety    Arthritis    Asthma    Cancer (HCC)    Basal Cell Skin Cancer   Chronic back pain    COPD (chronic obstructive pulmonary disease) (HCC)    Depression    Diabetes mellitus (HCC)    Dyspnea    GERD (gastroesophageal  reflux disease)    Gout    Gout    Headache    History of blood clots    Left Leg--July 2018   History of kidney stones    Hyperlipidemia    Hyperlipidemia    Hypertension    Kidney stones    Neuropathy    On home oxygen  therapy    2 L / M   Pneumonia 06/2017   Sleep apnea    Ulcer of foot (HCC)    Right    Past Surgical History:  Procedure Laterality Date   APPENDECTOMY     DG FEET 2 VIEWS BILAT     IRRIGATION AND DEBRIDEMENT FOOT Left 07/11/2023   Procedure: IRRIGATION AND DEBRIDEMENT FOOT;  Surgeon: Ashley Soulier, DPM;  Location: [facility] ORS;  Service: Orthopedics/Podiatry;  Laterality: Left;   LIPOMA EXCISION Right 08/15/2017   Procedure: EXCISION TUMOR(CYST) FOOT;  Surgeon: Lilli Cough, DPM;  Location: [facility] ORS;  Service: Podiatry;  Laterality: Right;   LOWER EXTREMITY ANGIOGRAPHY Left 07/03/2023   Procedure: Lower Extremity Angiography;  Surgeon: Marea Selinda RAMAN, MD;  Location: [facility] INVASIVE CV LAB;  Service: Cardiovascular;  Laterality: Left;   OTHER SURGICAL HISTORY Bilateral Foot surgery     reports that he has quit smoking. His smoking use included cigarettes. He started smoking about 7 years ago. He has a 4 pack-year smoking history. He has never used smokeless tobacco. He reports  current alcohol  use of about 1.0 standard drink of alcohol  per week. He reports that he does not use drugs.  Allergies  Allergen Reactions   Bee Venom Anaphylaxis   Linezolid  Other (See Comments)    Thrombocytopenia, hyperlactatemia   Azithromycin  Itching and Rash    Family History  Problem Relation Age of Onset   Hypertension Mother     Prior to Admission medications   Medication Sig Start Date End Date Taking? Authorizing Provider  acetaminophen  (TYLENOL ) 325 MG tablet Take 650 mg by mouth every 6 (six) hours as needed.    [provider]  albuterol  (VENTOLIN  HFA) 108 (90 Base) MCG/ACT inhaler Inhale 2 puffs into the lungs every 4 (four) hours as needed for wheezing or  shortness of breath.    [provider]  allopurinol  (ZYLOPRIM ) 300 MG tablet Take 600 mg by mouth daily. 02/24/20   [provider]  Amino Acids-Protein Hydrolys (PRO-STAT SUGAR FREE PO) Take 30 mLs by mouth 2 (two) times daily. Patient not taking: Reported on 11/03/2024    [provider]  aspirin  EC 81 MG tablet Take 1 tablet by mouth daily.    [provider]  azelastine  (ASTELIN ) 0.1 % nasal spray Place 2 sprays into both nostrils 2 (two) times daily as needed. 05/27/23   [provider]  bisacodyl  (DULCOLAX) 5 MG EC tablet Take 1 tablet (5 mg total) by mouth daily as needed for moderate constipation. Patient not taking: Reported on 11/03/2024 09/04/23   Dorinda Drue DASEN, MD  bismuth  subsalicylate (STOMACH RELIEF) 262 MG/15ML suspension Take 15 mLs by mouth every 6 (six) hours as needed. Patient not taking: Reported on 11/03/2024    [provider]  Buprenorphine  HCl-Naloxone  HCl 8-2 MG FILM Place 1 Film under the tongue in the morning, at noon, and at bedtime.    [provider]  clopidogrel  (PLAVIX ) 75 MG tablet Take 1 tablet (75 mg total) by mouth daily. 02/02/21   Leotis Bogus, MD  dapagliflozin  propanediol (FARXIGA ) 10 MG TABS tablet Take 1 tablet by mouth daily. 03/16/24 03/16/25  [provider]  DULoxetine  (CYMBALTA ) 60 MG capsule Take 60 mg by mouth daily.    [provider]  ezetimibe  (ZETIA ) 10 MG tablet Take 10 mg by mouth daily. 02/24/20   [provider]  fluticasone  (FLONASE ) 50 MCG/ACT nasal spray Place 1 spray into both nostrils daily as needed for allergies. 10/16/18   [provider]  Fluticasone -Umeclidin-Vilant (TRELEGY ELLIPTA) 100-62.5-25 MCG/ACT AEPB Inhale 1 puff into the lungs in the morning. 09/12/21   [provider]  furosemide  (LASIX ) 20 MG tablet Take 1 tablet (20 mg total) by mouth daily. 02/23/24   Jens Durand, MD  gabapentin  (NEURONTIN ) 800 MG tablet Take 800 mg  by mouth 4 (four) times daily. 10/22/23   [provider]  ipratropium-albuterol  (DUONEB) 0.5-2.5 (3) MG/3ML SOLN Take 3 mLs by nebulization every 6 (six) hours as needed (wheezing).    [provider]  metFORMIN  (GLUCOPHAGE ) 1000 MG tablet Take 1,000 mg by mouth daily.    [provider]  metoprolol  succinate (TOPROL -XL) 50 MG 24 hr tablet Take 50 mg by mouth daily. 02/24/20   [provider]  Multiple Vitamins-Minerals (MULTIVITAMIN ADULT, MINERALS, PO) Take 1 tablet by mouth every morning.    [provider]  naloxone  (NARCAN ) nasal spray 4 mg/0.1 mL Place 1 spray into the nose once.    [provider]  OHTUVAYRE  3 MG/2.5ML SUSP Take 5 mLs by  mouth daily. 07/05/24   [provider]  ondansetron  (ZOFRAN -ODT) 4 MG disintegrating tablet Take 4 mg by mouth every 8 (eight) hours as needed for nausea.    [provider]  OXYGEN  Inhale 3 L into the lungs.    [provider]  pantoprazole  (PROTONIX ) 40 MG tablet Take 40 mg by mouth daily as needed. Patient not taking: Reported on 11/03/2024 06/20/21   [provider]  predniSONE  (DELTASONE ) 20 MG tablet 60mg  daily x 2 days, 40mg  daily x 2 days, 20mg  daily x 2 days then stop. 11/07/24   Trudy Anthony HERO, MD  roflumilast  (DALIRESP ) 500 MCG TABS tablet Take 500 mcg by mouth daily. 08/15/21   [provider]  simvastatin  (ZOCOR ) 40 MG tablet Take 40 mg by mouth at bedtime. 01/17/16   [provider]  Syringe/Needle, Disp, (SYRINGE 3CC/25GX1) 25G X 1 3 ML MISC 1 Syringe by Does not apply route every 30 (thirty) days. 07/12/22   Brahmanday, Govinda R, MD  traZODone  (DESYREL ) 50 MG tablet Take 50 mg by mouth at bedtime as needed for sleep. 01/22/24 01/21/25  [provider]    Physical Exam: Vitals:   11/12/24 1530 11/12/24 1600 11/12/24 1645 11/12/24 1700  BP: 90/60 (!) 90/55 (!) 92/55 117/72  Pulse: (!) 110 (!) 102 98 100  Resp: (!) 21 16 (!)  21 17  Temp:      TempSrc:      SpO2: (!) 88% (!) 88% (!) 89% (!) 88%  Weight:      Height:        Physical Exam   Constitutional: Alert, awake, on BiPAP with moderate respiratory distress HEENT: Neck supple Respiratory: Bilateral decreased air entry at the bases with extensive wheezes on both lung fields, no rales Cardiovascular: Regular rate and rhythm, no murmurs / rubs / gallops. No extremity edema. 2+ pedal pulses. No carotid bruits.  Abdomen: Soft, no tenderness, Bowel sounds positive.  Musculoskeletal: no clubbing / cyanosis. Good ROM, no contractures. Normal muscle tone.  Skin: no rashes, lesions, ulcers. Neurologic: CN 2-12 grossly intact. Sensation intact, No focal deficit identified Psychiatric: Alert and oriented x 3. Normal mood.    Labs on Admission: I have personally reviewed following labs and imaging studies  CBC: Recent Labs  Lab 11/06/24 0538 11/07/24 0524 11/12/24 1408  WBC 19.5* 20.6* 25.5*  HGB 11.9* 11.9* 13.5  HCT 38.9* 39.7 45.0  MCV 91.7 93.9 95.9  PLT 289 312 288   Basic Metabolic Panel: Recent Labs  Lab 11/06/24 0538 11/07/24 0524 11/12/24 1434  NA 140 141 139  K 3.9 3.7 4.5  CL 95* 96* 97*  CO2 34* 36* 33*  GLUCOSE 154* 87 191*  BUN 31* 34* 38*  CREATININE 1.26* 1.09 1.25*  CALCIUM  8.4* 8.3* 9.2   GFR: Estimated Creatinine Clearance: 54.5 mL/min (A) (by C-G formula based on SCr of 1.25 mg/dL (H)). Liver Function Tests: Recent Labs  Lab 11/12/24 1434  AST 19  ALT 26  ALKPHOS 113  BILITOT 0.3  PROT 7.1  ALBUMIN  4.2   No results for input(s): LIPASE, AMYLASE in the last 168 hours. No results for input(s): AMMONIA in the last 168 hours. Coagulation Profile: No results for input(s): INR, PROTIME in the last 168 hours. Cardiac Enzymes: No results for input(s): CKTOTAL, CKMB, CKMBINDEX, TROPONINI, TROPONINIHS in the last 168 hours. BNP (last 3 results) Recent Labs    02/06/24 1730 02/19/24 1914  BNP  215.9* 284.6*   HbA1C: No results for  input(s): HGBA1C in the last 72 hours. CBG: Recent Labs  Lab 11/06/24 1949 11/06/24 2208 11/06/24 2326 11/07/24 0905 11/07/24 1325  GLUCAP 447* 439* 321* 106* 279*   Lipid Profile: No results for input(s): CHOL, HDL, LDLCALC, TRIG, CHOLHDL, LDLDIRECT in the last 72 hours. Thyroid  Function Tests: No results for input(s): TSH, T4TOTAL, FREET4, T3FREE, THYROIDAB in the last 72 hours. Anemia Panel: No results for input(s): VITAMINB12, FOLATE, FERRITIN, TIBC, IRON , RETICCTPCT in the last 72 hours. Urine analysis:    Component Value Date/Time   COLORURINE YELLOW (A) 07/02/2023 1406   APPEARANCEUR CLEAR (A) 07/02/2023 1406   APPEARANCEUR Clear 03/24/2013 1853   LABSPEC 1.009 07/02/2023 1406   LABSPEC 1.020 03/24/2013 1853   PHURINE 6.0 07/02/2023 1406   GLUCOSEU NEGATIVE 07/02/2023 1406   GLUCOSEU Negative 03/24/2013 1853   HGBUR LARGE (A) 07/02/2023 1406   BILIRUBINUR NEGATIVE 07/02/2023 1406   BILIRUBINUR Negative 03/24/2013 1853   KETONESUR NEGATIVE 07/02/2023 1406   PROTEINUR NEGATIVE 07/02/2023 1406   NITRITE NEGATIVE 07/02/2023 1406   LEUKOCYTESUR NEGATIVE 07/02/2023 1406   LEUKOCYTESUR Negative 03/24/2013 1853    Radiological Exams on Admission: I have personally reviewed images DG Chest Portable 1 View Result Date: 11/12/2024 CLINICAL DATA:  Shortness of breath.  COPD. EXAM: PORTABLE CHEST 1 VIEW COMPARISON:  Radiograph 11/03/2024.  CT 09/17/2024 FINDINGS: Chronic hyperinflation and interstitial coarsening. Spiculated left upper lobe pulmonary nodule, unchanged. Scattered parenchymal scarring, no acute airspace disease. Normal heart size with stable mediastinal contours. No significant pleural effusion. No pneumothorax. IMPRESSION: 1. Chronic hyperinflation and interstitial coarsening. No acute airspace disease. 2. Spiculated left upper lobe pulmonary nodule, unchanged. Electronically Signed   By:  Andrea Gasman M.D.   On: 11/12/2024 14:44    EKG: My personal interpretation of EKG shows: Sinus tachycardia with PVCs    Assessment/Plan Principal Problem:   Acute hypoxic on chronic hypercapnic respiratory failure (HCC) Active Problems:   NSCLC of left lung (HCC)   COPD with acute exacerbation (HCC)   Sleep apnea   Hypertension   Chronic heart failure with preserved ejection fraction (HFpEF) (HCC)   Insulin  dependent type 2 diabetes mellitus (HCC)    Assessment and Plan: 68 year old male with multiple medical problems including but not limited to lung cancer s/p radiation therapy, COPD on home oxygen  4 L, sleep apnea, HTN, DM, CHF who came in to ED complaining of shortness of breath started this morning.  He recently finished antibiotic and was discharged from the hospital few days ago.  1.  Acute hypoxemic respiratory failure likely due to COPD exacerbation in the setting of lung cancer and recent failed outpatient treatment. - He will be admitted to hospital as inpatient in the stepdown unit. - He was started on cefepime , and vancomycin , given Solu-Medrol , nebulization. - He is on BiPAP with CO2 retention at 80. - Due to his complex medical problem and he is getting worse on treatment, it will be prudent to have pulmonary and critical care involved.  I have called the consult for them. - Continue oxygen  to maintain saturation more than 90%  2.  Possible sepsis with leukocytosis, hypotension, tachycardia and respiratory distress - His blood pressure was slightly low and received 1 time dose of 250 cc normal saline bolus - He was given cefepime  and ordered vancomycin . - UA is pending - Will follow the cultures.  3.  History of lung cancer s/p radiation therapy - Follow-up as outpatient with oncologist  4.  Sleep apnea - He will be  on BiPAP at this point but once he is off BiPAP we may need CPAP during the night  5.  Type 2 diabetes - Will continue on insulin  insulin   sliding scale - Normally takes metformin  at home  6.  Hypertension/hyperlipidemia - Will hold off his home medications for now due to hypotension - Once blood pressure is stable will resume medications  7.  Congestive heart failure - He takes Lasix  at home but today he has blood pressure soft and likely has sepsis.  Will hold off diuretics at this point.  8.  Depression - Resume his Cymbalta  and trazodone .     DVT prophylaxis: Lovenox  Code Status: Full Code Family Communication: None Disposition Plan: Home versus rehab Consults called: Pulm critical care Admission status: Inpatient, Step Down Unit   Nena Rebel, MD [facility] 11/12/2024, 5:28 PM

## 2024-11-12 NOTE — Consult Note (Signed)
 PULMONOLOGY         Date: 11/12/2024,   MRN# [id number] Christian Fields [birth year]     AdmissionWeight: 77 kg                 CurrentWeight: 77 kg  Referring provider: Dr Roann   CHIEF COMPLAINT:   Recurrent acute exacerbation of COPD with lung cancer   HISTORY OF PRESENT ILLNESS   This is 68 year old male with a history of lifelong smoking and advanced COPD, had a diagnosis of stage I left upper lobe lung cancer after noticing an interval increase in the size of nodule from 1 cm to 1.4 in 2021.  He went straight to SBRT due to being a poor surgical candidate for biopsy.  He also is having recurrent COPD exacerbations and having rehospitalization for this.  He has chronic hypoxemia and uses oxygen  at home up to 5 L/min.  He has chronic physical deconditioning.  He has chronic peripheral edema and also follows up with cardiology for essential hypertension and dyslipidemia.  He also has a background history of CKD 3 A, sleep apnea noncompliant with CPAP therapy, polysubstance abuse and abdominal aortic aneurysm.  He came to the hospital on this admission with complaints of worsening shortness of breath x 1 day was noted to have oxygen  saturation approximately 60% on his usual 4 L/min nasal cannula he denies sick contacts or fevers at home.  He had a venous blood gas performed in the ED which showed mild acidemia with hypercapnia.  He was treated with nebulizer therapy and steroids as well as BiPAP and reports slight improvement since then.  He is started on empiric antibiotics with cefepime  and vancomycin  for possible pneumonia.  PCCM consultation placed due to recurrent hospitalization with advanced COPD, chronic hypoxemia and lung cancer.   PAST MEDICAL HISTORY   Past Medical History:  Diagnosis Date   Anemia    Anxiety    Arthritis    Asthma    Cancer (HCC)    Basal Cell Skin Cancer   Chronic back pain    COPD (chronic obstructive pulmonary disease) (HCC)    Depression     Diabetes mellitus (HCC)    Dyspnea    GERD (gastroesophageal reflux disease)    Gout    Gout    Headache    History of blood clots    Left Leg--July 2018   History of kidney stones    Hyperlipidemia    Hyperlipidemia    Hypertension    Kidney stones    Neuropathy    On home oxygen  therapy    2 L / M   Pneumonia 06/2017   Sleep apnea    Ulcer of foot (HCC)    Right     SURGICAL HISTORY   Past Surgical History:  Procedure Laterality Date   APPENDECTOMY     DG FEET 2 VIEWS BILAT     IRRIGATION AND DEBRIDEMENT FOOT Left 07/11/2023   Procedure: IRRIGATION AND DEBRIDEMENT FOOT;  Surgeon: Ashley Soulier, DPM;  Location: [facility] OR[facility];  Service: Orthopedics/Podiatry;  Laterality: Left;   LIPOMA EXCISION Right 08/15/2017   Procedure: EXCISION TUMOR(CYST) FOOT;  Surgeon: Lilli Cough, DPM;  Location: [facility] OR[facility];  Service: Podiatry;  Laterality: Right;   LOWER EXTREMITY ANGIOGRAPHY Left 07/03/2023   Procedure: Lower Extremity Angiography;  Surgeon: Marea Selinda RAMAN, MD;  Location: [facility];  Service: Cardiovascular;  Laterality: Left;   OTHER SURGICAL HISTORY Bilateral Foot surgery  FAMILY HISTORY   Family History  Problem Relation Age of Onset   Hypertension Mother      SOCIAL HISTORY   Social History   Tobacco Use   Smoking status: Former    Current packs/day: 0.50    Average packs/day: 0.5 packs/day for 7.9 years (4.0 ttl pk-yrs)    Types: Cigarettes    Start date: 2018   Smokeless tobacco: Never  Vaping Use   Vaping status: Never Used  Substance Use Topics   Alcohol  use: Yes    Alcohol /week: 1.0 standard drink of alcohol     Types: 1 Cans of beer per week    Comment: occ   Drug use: No     MEDICATIONS    Home Medication:    Current Medication:  Current Facility-Administered Medications:    acetaminophen  (TYLENOL ) tablet 650 mg, 650 mg, Oral, Q6H PRN **OR** acetaminophen  (TYLENOL ) suppository 650 mg, 650 mg, Rectal, Q6H PRN, Paudel, Keshab,  MD   ceFEPIme  (MAXIPIME ) 2 g in sodium chloride  0.9 % 100 mL IVPB, 2 g, Intravenous, Q12H, Patel, Rutvi M, RPH, Last Rate: 200 mL/hr at 11/12/24 1647, 2 g at 11/12/24 1647   enoxaparin  (LOVENOX ) injection 40 mg, 40 mg, Subcutaneous, Q24H, Paudel, Keshab, MD   methylPREDNISolone  sodium succinate  (SOLU-MEDROL ) 40 mg/mL injection 40 mg, 40 mg, Intravenous, Q12H, 40 mg at 11/12/24 1646 **FOLLOWED BY** [START ON 11/13/2024] predniSONE  (DELTASONE ) tablet 40 mg, 40 mg, Oral, Q breakfast, Paudel, Keshab, MD   morphine  (PF) 2 MG/ML injection 2 mg, 2 mg, Intravenous, Q4H PRN, Paudel, Keshab, MD   ondansetron  (ZOFRAN ) tablet 4 mg, 4 mg, Oral, Q6H PRN **OR** ondansetron  (ZOFRAN ) injection 4 mg, 4 mg, Intravenous, Q6H PRN, Paudel, Keshab, MD   oxyCODONE  (Oxy IR/ROXICODONE ) immediate release tablet 5 mg, 5 mg, Oral, Q4H PRN, Paudel, Keshab, MD   polyethylene glycol (MIRALAX  / GLYCOLAX ) packet 17 g, 17 g, Oral, Daily PRN, Paudel, Keshab, MD   sodium chloride  0.9 % bolus 250 mL, 250 mL, Intravenous, Once, Paudel, Keshab, MD    ALLERGIES   Bee venom, Linezolid , and Azithromycin      REVIEW OF SYSTEMS    Review of Systems:  Gen:  Denies  fever, sweats, chills weigh loss  HEENT: Denies blurred vision, double vision, ear pain, eye pain, hearing loss, nose bleeds, sore throat Cardiac:  No dizziness, chest pain or heaviness, chest tightness,edema Resp:   reports dyspnea chronically  Gi: Denies swallowing difficulty, stomach pain, nausea or vomiting, diarrhea, constipation, bowel incontinence Gu:  Denies bladder incontinence, burning urine Ext:   Denies Joint pain, stiffness or swelling Skin: Denies  skin rash, easy bruising or bleeding or hives Endoc:  Denies polyuria, polydipsia , polyphagia or weight change Psych:   Denies depression, insomnia or hallucinations   Other:  All other systems negative   VS: BP 117/72   Pulse 100   Temp 98.5 F (36.9 C) (Oral)   Resp 17   Ht 5' 6 (1.676 m)   Wt 77  kg   SpO2 (!) 88%   BMI 27.40 kg/m      PHYSICAL EXAM    GENERAL:NAD, no fevers, chills, no weakness no fatigue HEAD: Normocephalic, atraumatic.  EYES: Pupils equal, round, reactive to light. Extraocular muscles intact. No scleral icterus.  MOUTH: Moist mucosal membrane. Dentition intact. No abscess noted.  EAR, NOSE, THROAT: Clear without exudates. No external lesions.  NECK: Supple. No thyromegaly. No nodules. No JVD.  PULMONARY: decreased breath sounds with mild rhonchi worse at bases bilaterally.  CARDIOVASCULAR: S1 and S2. Regular rate and rhythm. No murmurs, rubs, or gallops. No edema. Pedal pulses 2+ bilaterally.  GASTROINTESTINAL: Soft, nontender, nondistended. No masses. Positive bowel sounds. No hepatosplenomegaly.  MUSCULOSKELETAL: No swelling, clubbing, or edema. Range of motion full in all extremities.  NEUROLOGIC: Cranial nerves II through XII are intact. No gross focal neurological deficits. Sensation intact. Reflexes intact.  SKIN: No ulceration, lesions, rashes, or cyanosis. Skin warm and dry. Turgor intact.  PSYCHIATRIC: Mood, affect within normal limits. The patient is awake, alert and oriented x 3. Insight, judgment intact.       IMAGING     Narrative & Impression  CLINICAL DATA:  Non-small-cell lung cancer restaging * Tracking Code: BO *   EXAM: CT CHEST WITHOUT CONTRAST   TECHNIQUE: Multidetector CT imaging of the chest was performed following the standard protocol without IV contrast.   RADIATION DOSE REDUCTION: This exam was performed according to the departmental dose-optimization program which includes automated exposure control, adjustment of the mA and/or kV according to patient size and/or use of iterative reconstruction technique.   COMPARISON:  07/15/2024   FINDINGS: Cardiovascular: Aortic atherosclerosis. Normal heart size. Left coronary artery calcifications. Gross enlargement of the main pulmonary artery measuring up to 4.1 cm in  caliber. No pericardial effusion.   Mediastinum/Nodes: Unchanged enlarged right hilar and pretracheal lymph nodes, right hilar nodes measuring up to 2.4 x 1.4 cm (series 2, image 71). Thyroid  gland, trachea, and esophagus demonstrate no significant findings.   Lungs/Pleura: Severe emphysema. Unchanged subpleural mass in the peripheral posterior left upper lobe measuring 2.5 x 2.1 cm (series 4, image 44). Interval resolution of consolidative airspace opacity in the right middle lobe (series 4, image 106) similar appearance of irregular opacity throughout the posterior right upper lobe. No pleural effusion or pneumothorax.   Upper Abdomen: No acute abnormality.   Musculoskeletal: No chest wall abnormality. No acute osseous findings.   IMPRESSION: 1. Unchanged subpleural mass in the peripheral posterior left upper lobe measuring 2.5 x 2.1 cm. 2. Interval resolution of consolidative airspace opacity in the right middle lobe. Similar appearance of irregular opacity throughout the posterior right upper lobe. Findings are consistent with improved infection or inflammation. 3. Unchanged enlarged right hilar and pretracheal lymph nodes. 4. Severe emphysema. 5. Gross enlargement of the main pulmonary artery, as can be seen in pulmonary hypertension. 6. Coronary artery disease.   Aortic Atherosclerosis (ICD10-I70.0) and Emphysema (ICD10-J43.9).     Electronically Signed   By: Marolyn JONETTA Jaksch M.D.   On: 09/20/2024 21:32    ASSESSMENT/PLAN   Acute on chronic hypoxemic respiratory failure - Chest x-ray performed today on admission shows no signs of pneumonia, pulmonary edema, pneumothorax, and previous left upper lobe lesion is stable from prior status post SBRT -He is being treated with steroids and antibiotics as well as nebulizer therapy for COPD exacerbation which I think is the right care path.  -CRP trend - proBNP - 20 panel respiratory viral testing - I have dcd IV antibiotics  and will continue zithromax  250 -will continue solumedrol 40 BID for now    Atelelctasis at bases bilaterally  - incentive spirometry and flutter valve     Thank you for allowing me to participate in the care of this patient.   Patient/Family are satisfied with care plan and all questions have been answered.    Provider disclosure: Patient with at least one acute or chronic illness or injury that poses a threat to life or bodily function and is  being managed actively during this encounter.  All of the below services have been performed independently by signing provider:  review of prior documentation from internal and or external health records.  Review of previous and current lab results.  Interview and comprehensive assessment during patient visit today. Review of current and previous chest radiographs/CT scans. Discussion of management and test interpretation with health care team and patient/family.   This document was prepared using Dragon voice recognition software and may include unintentional dictation errors.     Raini Tiley, M.D.  Division of Pulmonary & Critical Care Medicine

## 2024-11-12 NOTE — ED Notes (Signed)
#  18G EMS IV d/c'd, w/ tip intact.  Pt tolerated procedure well.  IV d/c'd due to occlusion.  Gauze and tape applied to site and pressure was held until bleeding had stopped.

## 2024-11-12 NOTE — Progress Notes (Addendum)
 Pharmacy Antibiotic Note  Christian Fields is a 68 y.o. male admitted on 11/12/2024 with sepsis and acute hypoxic on chronic hypercarbic respiratory failure. PMH significant for COPD on home oxygen  4 L, non-small cell lung cancer s/p RXT, OSA on CPAP, GERD, history of TIA, asthma, diabetes type 2, HLD, HTN, and CKD-3a. Patient was also recently admitted from 11/26-11/30 for COPD exacerbation. Pharmacy has been consulted for Vancomycin  dosing.  12/5: Patient has a hx of CKD, with Scr 1.25 most recently today (BL ~1.2-1.6) WBC trending up, 19.5 > 20.6 > 25.5. Will start a scheduled regimen right now and continue to monitor.    Plan: - Will give Vancomycin  1750 mg IV loading dose x 1 - Will start Vancomycin  maintenance dose at 1250 mg IV q24h (eAUC 488.3, Cmin 11.9, Scr 1.25, IBW used, Vd 0.72 L/kg) - Goal AUC 400-550 - Patient also receiving Cefepime  2 g q12h, with a 5 day stop in place - Will continue to monitor renal function and signs of clinical improvement     Height: 5' 6 (167.6 cm) Weight: 77 kg (169 lb 12.1 oz) IBW/kg (Calculated) : 63.8  Temp (24hrs), Avg:98.5 F (36.9 C), Min:98.5 F (36.9 C), Max:98.5 F (36.9 C)  Recent Labs  Lab 11/06/24 0538 11/07/24 0524 11/12/24 1408 11/12/24 1434  WBC 19.5* 20.6* 25.5*  --   CREATININE 1.26* 1.09  --  1.25*    Estimated Creatinine Clearance: 54.5 mL/min (A) (by C-G formula based on SCr of 1.25 mg/dL (H)).    Allergies  Allergen Reactions   Bee Venom Anaphylaxis   Linezolid  Other (See Comments)    Thrombocytopenia, hyperlactatemia   Azithromycin  Itching and Rash    Antimicrobials this admission: Cefepime  12/5 >> 7 day stop Vancomycin  12/5 >>  Dose adjustments this admission:   Microbiology results: 12/5 Sputum: ordered  12/5 MRSA PCR: ordered  Thank you for allowing pharmacy to be a part of this patient's care.    Ransom Blanch PGY-1 Pharmacy Resident  [facility]  11/12/2024 5:42 PM

## 2024-11-13 DIAGNOSIS — J9601 Acute respiratory failure with hypoxia: Secondary | ICD-10-CM

## 2024-11-13 DIAGNOSIS — J9612 Chronic respiratory failure with hypercapnia: Secondary | ICD-10-CM

## 2024-11-13 LAB — RESPIRATORY PANEL BY PCR

## 2024-11-13 LAB — COMPREHENSIVE METABOLIC PANEL WITH GFR
ALT: 21 U/L (ref 0–44)
AST: 18 U/L (ref 15–41)
Albumin: 3.7 g/dL (ref 3.5–5.0)
Alkaline Phosphatase: 102 U/L (ref 38–126)
Anion gap: 13 (ref 5–15)
BUN: 42 mg/dL — ABNORMAL HIGH (ref 8–23)
CO2: 27 mmol/L (ref 22–32)
Calcium: 8.5 mg/dL — ABNORMAL LOW (ref 8.9–10.3)
Chloride: 98 mmol/L (ref 98–111)
Creatinine, Ser: 1.47 mg/dL — ABNORMAL HIGH (ref 0.61–1.24)
GFR, Estimated: 51 mL/min — ABNORMAL LOW (ref >60)
Glucose, Bld: 341 mg/dL — ABNORMAL HIGH (ref 70–99)
Potassium: 4.8 mmol/L (ref 3.5–5.1)
Sodium: 138 mmol/L (ref 135–145)
Total Bilirubin: <0.2 mg/dL (ref 0.0–1.2)
Total Protein: 6.3 g/dL — ABNORMAL LOW (ref 6.5–8.1)

## 2024-11-13 LAB — BLOOD GAS, VENOUS
Acid-Base Excess: 2.8 mmol/L — ABNORMAL HIGH (ref 0.0–2.0)
Bicarbonate: 31 mmol/L — ABNORMAL HIGH (ref 20.0–28.0)
Delivery systems: POSITIVE
O2 Saturation: 91.4 %
Patient temperature: 37
pCO2, Ven: 63 mmHg — ABNORMAL HIGH (ref 44–60)
pH, Ven: 7.3 (ref 7.25–7.43)
pO2, Ven: 62 mmHg — ABNORMAL HIGH (ref 32–45)

## 2024-11-13 LAB — CBC
HCT: 40.4 % (ref 39.0–52.0)
Hemoglobin: 12 g/dL — ABNORMAL LOW (ref 13.0–17.0)
MCH: 28.6 pg (ref 26.0–34.0)
MCHC: 29.7 g/dL — ABNORMAL LOW (ref 30.0–36.0)
MCV: 96.4 fL (ref 80.0–100.0)
Platelets: 256 K/uL (ref 150–400)
RBC: 4.19 MIL/uL — ABNORMAL LOW (ref 4.22–5.81)
RDW: 16.1 % — ABNORMAL HIGH (ref 11.5–15.5)
WBC: 12.7 K/uL — ABNORMAL HIGH (ref 4.0–10.5)
nRBC: 0 % (ref 0.0–0.2)

## 2024-11-13 LAB — PROTIME-INR
INR: 1 (ref 0.8–1.2)
Prothrombin Time: 13.8 s (ref 11.4–15.2)

## 2024-11-13 LAB — PROCALCITONIN: Procalcitonin: 0.23 ng/mL

## 2024-11-13 MED ORDER — BUPRENORPHINE HCL-NALOXONE HCL 8-2 MG SL SUBL: 1.0000 | SUBLINGUAL_TABLET | Freq: Every day | SUBLINGUAL | Status: DC

## 2024-11-13 MED ORDER — PANTOPRAZOLE SODIUM 40 MG PO TBEC
40.0000 mg | DELAYED_RELEASE_TABLET | Freq: Two times a day (BID) | ORAL | Status: DC
  Administered 2024-11-13 – 2024-11-15 (×4): 40 mg via ORAL
  Filled 2024-11-13 (×4): qty 1

## 2024-11-13 MED ORDER — BUPRENORPHINE HCL-NALOXONE HCL 8-2 MG SL SUBL
1.0000 | SUBLINGUAL_TABLET | Freq: Three times a day (TID) | SUBLINGUAL | Status: DC
  Administered 2024-11-13 – 2024-11-15 (×8): 1 via SUBLINGUAL
  Filled 2024-11-13 (×8): qty 1

## 2024-11-13 MED ORDER — GABAPENTIN 400 MG PO CAPS: 800.0000 mg | ORAL_CAPSULE | Freq: Four times a day (QID) | ORAL | Status: DC

## 2024-11-13 MED ORDER — BUPRENORPHINE HCL-NALOXONE HCL 8-2 MG SL SUBL
1.0000 | SUBLINGUAL_TABLET | Freq: Every day | SUBLINGUAL | Status: DC
  Filled 2024-11-13: qty 1

## 2024-11-13 MED ORDER — GABAPENTIN 400 MG PO CAPS
800.0000 mg | ORAL_CAPSULE | Freq: Three times a day (TID) | ORAL | Status: DC
  Administered 2024-11-13 – 2024-11-15 (×9): 800 mg via ORAL
  Filled 2024-11-13 (×9): qty 2

## 2024-11-13 MED ORDER — GABAPENTIN 400 MG PO CAPS
800.0000 mg | ORAL_CAPSULE | Freq: Four times a day (QID) | ORAL | Status: DC
  Filled 2024-11-13: qty 2

## 2024-11-13 MED ORDER — AZITHROMYCIN 250 MG PO TABS
250.0000 mg | ORAL_TABLET | Freq: Every day | ORAL | Status: DC
  Administered 2024-11-13 – 2024-11-14 (×2): 250 mg via ORAL
  Filled 2024-11-13 (×2): qty 1

## 2024-11-13 MED ORDER — IPRATROPIUM-ALBUTEROL 0.5-2.5 (3) MG/3ML IN SOLN
3.0000 mL | Freq: Four times a day (QID) | RESPIRATORY_TRACT | Status: DC
  Administered 2024-11-13 – 2024-11-14 (×7): 3 mL via RESPIRATORY_TRACT
  Filled 2024-11-13 (×7): qty 3

## 2024-11-13 NOTE — Plan of Care (Signed)

## 2024-11-13 NOTE — Progress Notes (Signed)
 Primary nurse Mariama RN originally came to me and told me she had saw patient with a pill bottle but when she asked about it he would not tell her what it was. This nurse acting as charge nurse on unit went in and spoke with patient about medication. He told this nurse that he had his home supply of Suboxone  and Gabapentin  with him and when asked if I could store them and send them to pharmacy he said no. I also asked if I could get them ordered for him as they were not ordered and he was a pain management patient. We did reach out to Dr Cleatus and obtained orders for the medications. She placed new orders and when we went to administer the medication and asked again to let us  send his home medications to pharmacy he refused. AC Debby Mayo RN was then called and he came to speak with patient and patient again vehemently refused to give over medications. We did not end up administering the medications. Policy was pulled. I called and spoke with pharmacist and there was nothing in the policy about patients refusing to give over their home schedule IV medications. We did message Dr Cleatus and inform her of all this and she ended d/c'ing the medication orders. We will defer to dayshift on what next steps will be. I also sent out a message to my engineer, maintenance (it), awaiting reply.

## 2024-11-13 NOTE — Progress Notes (Signed)
 Informed Christian Solian MD that there was a concern that patient had supply of soboxone and gabapentin  at bedside. Patient refused to hand over medication so that it could be secured and refused to consent to search of belongings.

## 2024-11-13 NOTE — Progress Notes (Signed)
  [facility] PROVIDER ASSESSMENT NOTE DATE ASSESSED:  11/13/2024 PERFORMED BY: Zachary PARAS Kambree Krauss  68 year old male with PMHx chronic respiratory failure and COPD admitted for Acute on chronic respiratory failure.    Patient originally was going to be transferred from the progressive unit to the SDU for BiPAP but the patient has since clinically improved.  Repeat VBG has revealed pH and pCO2 have returned to baseline historical numbers.  Nurisng reports the patient is looking better clinically.  Will keep patient in progressive unit for now, currently on 7lpm.  Day team to reevaluate patient in the AM and nursing to notify me or the night team if the patient has another change in clinical status.  Zachary PARAS [facility]

## 2024-11-13 NOTE — Progress Notes (Signed)
 Pt complaining of SOB and heart racing up. Pt was not on cardiac monitoring. EKG completed and MD Cleatus made aware.  Update 0015: See new order.

## 2024-11-13 NOTE — Progress Notes (Signed)
 PROGRESS NOTE    Christian Fields  FMW:[id number] DOB: 1956-01-29 DOA: 11/12/2024 PCP: Lora Odor, FNP  [id number]  LOS: 2 days   Brief hospital course:   Assessment & Plan: Christian Fields is a pleasant 68 y.o. male with medical history significant for COPD on home oxygen  4 L, non-small cell lung cancer s/p RXT, OSA on CPAP, GERD, history of TIA, asthma, diabetes type 2, HLD, HTN, recent history of admission and discharge from [facility] between 11/03/2024 to 11/07/2024 after being treated for COPD exacerbation brought in today for shortness of breath started last night.  He stated that he finished his antibiotics cefdinir and doxycycline   couple days ago.  EMS gave him 3 DuoNebs, 2 g of magnesium  and 125 mg of Solu-Medrol  IV.  Initial oxygen  was 60 to 70% on 4 L of nasal cannula oxygen .  Patient was placed on nonrebreather.   In the ED, patient is found to be tachypneic, tachycardic and with increased work of breathing.  Chest x-ray did not show obvious consolidation.  VBG showed pCO2 of 80 and was placed on BiPAP.     1.  Acute on chronic hypoxemic and hypercapnic respiratory failure likely due to  COPD exacerbation 2/2 Parainfluenza Virus 1 infection On 4L O2 at baseline --pulm consulted on admission --pCO2 decreased with brief BiPAP, and pt was back on home 4L O2 the next day. --cont IV solumedrol 40 q12h --start azithromycin  250 mg daily --DuoNebQID --Continue supplemental O2 to keep sats >=90%  Sepsis ruled out CAP ruled out --CXR showed no acute finding. --d/c zosyn   3.  History of lung cancer s/p radiation therapy - Follow-up as outpatient with oncologist   4.  Sleep apnea --CPAP nightly   5.  Type 2 diabetes   6.  Hypertension/hyperlipidemia - Will hold off his home medications for now due to hypotension - Once blood pressure is stable will resume medications   7.  Congestive heart failure   8.  Depression - cont Cymbalta    DVT prophylaxis: Lovenox   SQ Code Status: Full code  Family Communication: daughter updated on the phone today Level of care: Med-Surg Dispo:   The patient is from: home Anticipated d/c is to: home Anticipated d/c date is: 1-2 days   Subjective and Interval History:  Main concern overnight and this morning was pt refusing to hand over his home suboxone  and gabapentin  and said to RN that he would take his home supplies if he didn't receive his pain meds at the times he wanted.  After talking with daughter, daughter convinced pt to let her take his meds home.  Home suboxone  and gabapentin  ordered prescribed.     Objective: Vitals:   11/13/24 1958 11/13/24 2014 11/13/24 2318 11/14/24 0417  BP: (!) 132/90  (!) 144/76 (!) 165/89  Pulse: (!) 103  (!) 110 (!) 108  Resp: 20  20 20   Temp: 98.3 F (36.8 C)  97.8 F (36.6 C) 98.3 F (36.8 C)  TempSrc:      SpO2: 92% 94% 91% 91%  Weight:      Height:        Intake/Output Summary (Last 24 hours) at 11/14/2024 0803 Last data filed at 11/14/2024 0729 Gross per 24 hour  Intake 474 ml  Output 1550 ml  Net -1076 ml   Filed Weights   11/12/24 1353  Weight: 77 kg    Examination:   Constitutional: NAD, AAOx3 HEENT: conjunctivae and lids normal, EOMI CV: No cyanosis.   RESP:  normal respiratory effort, on 4L Neuro: II - XII grossly intact.     Data Reviewed: I have personally reviewed labs and imaging studies  Time spent: 50 minutes  Ellouise Haber, MD [facility] If 7PM-7AM, please contact night-coverage 11/14/2024, 8:03 AM

## 2024-11-13 NOTE — Progress Notes (Signed)
 Patients daughter in room, she is taking patients Gabapentin  (3 tablets) and suboxone  (4) Corean RN witnessed this nurse giving daughter the home meds. Provider aware.

## 2024-11-13 NOTE — Plan of Care (Signed)
  Problem: Education: Goal: Knowledge of General Education information will improve Description: Including pain rating scale, medication(s)/side effects and non-pharmacologic comfort measures Outcome: Progressing   Problem: Clinical Measurements: Goal: Respiratory complications will improve Outcome: Progressing   Problem: Clinical Measurements: Goal: Cardiovascular complication will be avoided Outcome: Progressing   Problem: Activity: Goal: Risk for activity intolerance will decrease Outcome: Progressing   Problem: Elimination: Goal: Will not experience complications related to bowel motility Outcome: Progressing   Problem: Elimination: Goal: Will not experience complications related to urinary retention Outcome: Progressing   Problem: Pain Managment: Goal: General experience of comfort will improve and/or be controlled Outcome: Progressing

## 2024-11-13 NOTE — Progress Notes (Signed)
 Pt refused chair alarm and bed alarm at this time, but was educated about its importance. Pt verbalized understanding but still refused.

## 2024-11-14 ENCOUNTER — Inpatient Hospital Stay

## 2024-11-14 ENCOUNTER — Other Ambulatory Visit: Payer: Self-pay

## 2024-11-14 DIAGNOSIS — J9601 Acute respiratory failure with hypoxia: Secondary | ICD-10-CM | POA: Diagnosis not present

## 2024-11-14 DIAGNOSIS — J9612 Chronic respiratory failure with hypercapnia: Secondary | ICD-10-CM | POA: Diagnosis not present

## 2024-11-14 LAB — BASIC METABOLIC PANEL WITH GFR
Anion gap: 12 (ref 5–15)
BUN: 34 mg/dL — ABNORMAL HIGH (ref 8–23)
CO2: 26 mmol/L (ref 22–32)
Calcium: 8.6 mg/dL — ABNORMAL LOW (ref 8.9–10.3)
Chloride: 99 mmol/L (ref 98–111)
Creatinine, Ser: 1.18 mg/dL (ref 0.61–1.24)
GFR, Estimated: >60 mL/min (ref >60)
Glucose, Bld: 219 mg/dL — ABNORMAL HIGH (ref 70–99)
Potassium: 4.7 mmol/L (ref 3.5–5.1)
Sodium: 137 mmol/L (ref 135–145)

## 2024-11-14 LAB — PROCALCITONIN: Procalcitonin: 0.18 ng/mL

## 2024-11-14 LAB — CBC
HCT: 44.8 % (ref 39.0–52.0)
Hemoglobin: 13.1 g/dL (ref 13.0–17.0)
MCH: 28.8 pg (ref 26.0–34.0)
MCHC: 29.2 g/dL — ABNORMAL LOW (ref 30.0–36.0)
MCV: 98.5 fL (ref 80.0–100.0)
Platelets: 274 K/uL (ref 150–400)
RBC: 4.55 MIL/uL (ref 4.22–5.81)
RDW: 15.9 % — ABNORMAL HIGH (ref 11.5–15.5)
WBC: 24 K/uL — ABNORMAL HIGH (ref 4.0–10.5)
nRBC: 0.1 % (ref 0.0–0.2)

## 2024-11-14 LAB — PRO BRAIN NATRIURETIC PEPTIDE: Pro Brain Natriuretic Peptide: 2454 pg/mL — ABNORMAL HIGH (ref <300.0)

## 2024-11-14 LAB — GLUCOSE, CAPILLARY
Glucose-Capillary: 203 mg/dL — ABNORMAL HIGH (ref 70–99)
Glucose-Capillary: 192 mg/dL — ABNORMAL HIGH (ref 70–99)

## 2024-11-14 LAB — MAGNESIUM: Magnesium: 2.4 mg/dL (ref 1.7–2.4)

## 2024-11-14 MED ORDER — CHLORHEXIDINE GLUCONATE CLOTH 2 % EX PADS
6.0000 | MEDICATED_PAD | Freq: Every day | CUTANEOUS | Status: DC
  Administered 2024-11-15: 6 via TOPICAL

## 2024-11-14 MED ORDER — METOPROLOL SUCCINATE ER 50 MG PO TB24
50.0000 mg | ORAL_TABLET | Freq: Every day | ORAL | Status: DC
  Administered 2024-11-14: 50 mg via ORAL
  Filled 2024-11-14: qty 1

## 2024-11-14 MED ORDER — MORPHINE SULFATE (PF) 2 MG/ML IV SOLN
1.0000 mg | Freq: Once | INTRAVENOUS | Status: AC
  Administered 2024-11-14: 1 mg via INTRAVENOUS
  Filled 2024-11-14: qty 1

## 2024-11-14 MED ORDER — LABETALOL HCL 5 MG/ML IV SOLN
10.0000 mg | Freq: Once | INTRAVENOUS | Status: AC
  Administered 2024-11-14: 10 mg via INTRAVENOUS
  Filled 2024-11-14: qty 4

## 2024-11-14 MED ORDER — METOPROLOL SUCCINATE ER 50 MG PO TB24
100.0000 mg | ORAL_TABLET | Freq: Every day | ORAL | Status: DC
  Administered 2024-11-15: 100 mg via ORAL

## 2024-11-14 MED ORDER — SODIUM CHLORIDE 3 % IN NEBU
4.0000 mL | INHALATION_SOLUTION | Freq: Two times a day (BID) | RESPIRATORY_TRACT | Status: DC
  Filled 2024-11-14: qty 4

## 2024-11-14 MED ORDER — FUROSEMIDE 10 MG/ML IJ SOLN
40.0000 mg | Freq: Once | INTRAMUSCULAR | Status: AC
  Administered 2024-11-14: 40 mg via INTRAVENOUS
  Filled 2024-11-14: qty 4

## 2024-11-14 MED ORDER — INSULIN ASPART 100 UNIT/ML IJ SOLN
0.0000 [IU] | Freq: Three times a day (TID) | INTRAMUSCULAR | Status: DC
  Administered 2024-11-15: 4 [IU] via SUBCUTANEOUS
  Administered 2024-11-15: 3 [IU] via SUBCUTANEOUS
  Filled 2024-11-14: qty 3
  Filled 2024-11-14: qty 4

## 2024-11-14 MED ORDER — INSULIN ASPART 100 UNIT/ML IJ SOLN: 0.0000 [IU] | Freq: Every day | INTRAMUSCULAR | Status: DC

## 2024-11-14 MED ORDER — METOPROLOL SUCCINATE ER 50 MG PO TB24
50.0000 mg | ORAL_TABLET | Freq: Once | ORAL | Status: AC
  Administered 2024-11-14: 50 mg via ORAL
  Filled 2024-11-14: qty 1

## 2024-11-14 MED ORDER — METHYLPREDNISOLONE SODIUM SUCC 40 MG IJ SOLR
40.0000 mg | Freq: Two times a day (BID) | INTRAMUSCULAR | Status: DC
  Administered 2024-11-14 – 2024-11-15 (×3): 40 mg via INTRAVENOUS
  Filled 2024-11-14 (×3): qty 1

## 2024-11-14 MED ORDER — IPRATROPIUM-ALBUTEROL 0.5-2.5 (3) MG/3ML IN SOLN
3.0000 mL | Freq: Four times a day (QID) | RESPIRATORY_TRACT | Status: DC
  Administered 2024-11-14 – 2024-11-15 (×2): 3 mL via RESPIRATORY_TRACT
  Filled 2024-11-14 (×3): qty 3

## 2024-11-14 MED ORDER — HYDRALAZINE HCL 20 MG/ML IJ SOLN
5.0000 mg | INTRAMUSCULAR | Status: DC | PRN
  Administered 2024-11-15: 5 mg via INTRAVENOUS
  Filled 2024-11-14: qty 1

## 2024-11-14 NOTE — Progress Notes (Signed)
 PROGRESS NOTE    Christian Fields  FMW:[id number] DOB: May 31, 1956 DOA: 11/12/2024 PCP: Lora Odor, FNP  [id number]  LOS: 2 days   Brief hospital course:   Assessment & Plan: Christian Fields is a pleasant 68 y.o. male with medical history significant for COPD on home oxygen  4 L, non-small cell lung cancer s/p RXT, OSA on CPAP, GERD, history of TIA, asthma, diabetes type 2, HLD, HTN, recent history of admission and discharge from [facility] between 11/03/2024 to 11/07/2024 after being treated for COPD exacerbation brought in today for shortness of breath started last night.  He stated that he finished his antibiotics cefdinir and doxycycline   couple days ago.  EMS gave him 3 DuoNebs, 2 g of magnesium  and 125 mg of Solu-Medrol  IV.  Initial oxygen  was 60 to 70% on 4 L of nasal cannula oxygen .  Patient was placed on nonrebreather.   In the ED, patient is found to be tachypneic, tachycardic and with increased work of breathing.  Chest x-ray did not show obvious consolidation.  VBG showed pCO2 of 80 and was placed on BiPAP.     1.  Acute on chronic hypoxemic and hypercapnic respiratory failure likely due to  COPD exacerbation 2/2 Parainfluenza Virus 1 infection On 4L O2 at baseline --pulm consulted on admission --Pt had more dyspnea and respiratory distress today, with increased RR and work of breathing.  Pt was placed back on BiPAP.  --procal 0.18 yesterday.  Repeat CXR pending. --cont IV solumedrol 40 q12h --cont azithro 250 mg daily --DuoNeb QID --start hypertonic saline neb BID --Continue supplemental O2 to keep sats >=90%  Sepsis ruled out CAP ruled out --CXR showed no acute finding.  D/c'ed zosyn   3.  History of lung cancer s/p radiation therapy - Follow-up as outpatient with oncologist   4.  Sleep apnea on CPAP  --BiPAP nightly while inpatient   5.  Type 2 diabetes Hyperglycemia due to steroid --ACHS and SSI resistant scale   6.  Hypertension and sinus tachycardia --BP  and HR elevated today, likely due to stress --EKG showed sinus tach --increase home Toprol  to 100 mg daily   7.  Combined Congestive heart failure --Echo in March 2025 showed LVEF 40-45% with grad I DD. --trail IV lasix  40  8.  Depression - cont Cymbalta    DVT prophylaxis: Lovenox  SQ Code Status: Full code  Family Communication:  Level of care: Stepdown Dispo:   The patient is from: home Anticipated d/c is to: home Anticipated d/c date is: 2-3 days   Subjective and Interval History:  Pt had more dyspnea and respiratory distress today, with increased RR and work of breathing.  Pt was placed back on BiPAP.  HR and BP elevated presumed due to stress.  Due to nursing concerns, pt was transferred to stepdown.   Objective: Vitals:   11/14/24 1752 11/14/24 1818 11/14/24 1930 11/14/24 2000  BP: (!) 161/89 (!) 141/102 (!) 144/80   Pulse: (!) 126 (!) 129 (!) 120 (!) 119  Resp: (!) 27 (!) 27 19 18   Temp:  99 F (37.2 C)    TempSrc:  Axillary    SpO2: 92% 94% 95% 92%  Weight:  75.8 kg    Height:        Intake/Output Summary (Last 24 hours) at 11/14/2024 2018 Last data filed at 11/14/2024 1920 Gross per 24 hour  Intake 237 ml  Output 2400 ml  Net -2163 ml   Filed Weights   11/12/24 1353 11/14/24 1818  Weight: 77 kg 75.8 kg    Examination:   Constitutional: NAD, AAOx3 HEENT: conjunctivae and lids normal, EOMI CV: No cyanosis.   RESP: increased RR, diffuse rhonchi, on BiPAP SKIN: warm, dry Neuro: II - XII grossly intact.     Data Reviewed: I have personally reviewed labs and imaging studies  Time spent: 50 minutes  Ellouise Haber, MD [facility] If 7PM-7AM, please contact night-coverage 11/14/2024, 8:18 PM

## 2024-11-14 NOTE — Progress Notes (Signed)
 PULMONOLOGY         Date: 11/14/2024,   MRN# [id number] Christian Fields 1956/01/30     AdmissionWeight: 77 kg                 CurrentWeight: 77 kg  Referring provider: Dr Roann   CHIEF COMPLAINT:   Recurrent acute exacerbation of COPD with lung cancer   HISTORY OF PRESENT ILLNESS   This is 68 year old male with a history of lifelong smoking and advanced COPD, had a diagnosis of stage I left upper lobe lung cancer after noticing an interval increase in the size of nodule from 1 cm to 1.4 in 2021.  He went straight to SBRT due to being a poor surgical candidate for biopsy.  He also is having recurrent COPD exacerbations and having rehospitalization for this.  He has chronic hypoxemia and uses oxygen  at home up to 5 L/min.  He has chronic physical deconditioning.  He has chronic peripheral edema and also follows up with cardiology for essential hypertension and dyslipidemia.  He also has a background history of CKD 3 A, sleep apnea noncompliant with CPAP therapy, polysubstance abuse and abdominal aortic aneurysm.  He came to the hospital on this admission with complaints of worsening shortness of breath x 1 day was noted to have oxygen  saturation approximately 60% on his usual 4 L/min nasal cannula he denies sick contacts or fevers at home.  He had a venous blood gas performed in the ED which showed mild acidemia with hypercapnia.  He was treated with nebulizer therapy and steroids as well as BiPAP and reports slight improvement since then.  He is started on empiric antibiotics with cefepime  and vancomycin  for possible pneumonia.  PCCM consultation placed due to recurrent hospitalization with advanced COPD, chronic hypoxemia and lung cancer.  11/14/24-patient with Parainfluenza induced AECOPD.  He was moved to Step down unit due to progressive dyspnea and is on BIPAP. He is on solumedrol BID and nebulizer therapy. Will continue to follow. Anticipate slow improvement over next 3-5 weeks  with this virus. Hopefully dc in 3-4days.  PAST MEDICAL HISTORY   Past Medical History:  Diagnosis Date   Anemia    Anxiety    Arthritis    Asthma    Cancer (HCC)    Basal Cell Skin Cancer   Chronic back pain    COPD (chronic obstructive pulmonary disease) (HCC)    Depression    Diabetes mellitus (HCC)    Dyspnea    GERD (gastroesophageal reflux disease)    Gout    Gout    Headache    History of blood clots    Left Leg--July 2018   History of kidney stones    Hyperlipidemia    Hyperlipidemia    Hypertension    Kidney stones    Neuropathy    On home oxygen  therapy    2 L / M   Pneumonia 06/2017   Sleep apnea    Ulcer of foot (HCC)    Right     SURGICAL HISTORY   Past Surgical History:  Procedure Laterality Date   APPENDECTOMY     DG FEET 2 VIEWS BILAT     IRRIGATION AND DEBRIDEMENT FOOT Left 07/11/2023   Procedure: IRRIGATION AND DEBRIDEMENT FOOT;  Surgeon: Ashley Soulier, DPM;  Location: [facility];  Service: Orthopedics/Podiatry;  Laterality: Left;   LIPOMA EXCISION Right 08/15/2017   Procedure: EXCISION TUMOR(CYST) FOOT;  Surgeon: Lilli Cough, DPM;  Location: [facility];  Service: Podiatry;  Laterality: Right;   LOWER EXTREMITY ANGIOGRAPHY Left 07/03/2023   Procedure: Lower Extremity Angiography;  Surgeon: Marea Selinda RAMAN, MD;  Location: [facility];  Service: Cardiovascular;  Laterality: Left;   OTHER SURGICAL HISTORY Bilateral Foot surgery     FAMILY HISTORY   Family History  Problem Relation Age of Onset   Hypertension Mother      SOCIAL HISTORY   Social History   Tobacco Use   Smoking status: Former    Current packs/day: 0.50    Average packs/day: 0.5 packs/day for 7.9 years (4.0 ttl pk-yrs)    Types: Cigarettes    Start date: 2018   Smokeless tobacco: Never  Vaping Use   Vaping status: Never Used  Substance Use Topics   Alcohol  use: Yes    Alcohol /week: 1.0 standard drink of alcohol     Types: 1 Cans of beer per week    Comment:  occ   Drug use: No     MEDICATIONS    Home Medication:    Current Medication:  Current Facility-Administered Medications:    acetaminophen  (TYLENOL ) tablet 650 mg, 650 mg, Oral, Q6H PRN **OR** acetaminophen  (TYLENOL ) suppository 650 mg, 650 mg, Rectal, Q6H PRN, Paudel, Keshab, MD   aspirin  EC tablet 81 mg, 81 mg, Oral, Daily, Paudel, Keshab, MD, 81 mg at 11/14/24 0805   azithromycin  (ZITHROMAX ) tablet 250 mg, 250 mg, Oral, Daily, Awanda [place], MD, 250 mg at 11/14/24 0805   buprenorphine -naloxone  (SUBOXONE ) 8-2 mg per SL tablet 1 tablet, 1 tablet, Sublingual, TID, Awanda [place], MD, 1 tablet at 11/14/24 1426   clopidogrel  (PLAVIX ) tablet 75 mg, 75 mg, Oral, Daily, Paudel, Keshab, MD, 75 mg at 11/14/24 0807   DULoxetine  (CYMBALTA ) DR capsule 60 mg, 60 mg, Oral, Daily, Paudel, Keshab, MD, 60 mg at 11/14/24 0804   enoxaparin  (LOVENOX ) injection 40 mg, 40 mg, Subcutaneous, Q24H, Paudel, Keshab, MD, 40 mg at 11/13/24 2126   ezetimibe  (ZETIA ) tablet 10 mg, 10 mg, Oral, Daily, Paudel, Keshab, MD, 10 mg at 11/14/24 0805   gabapentin  (NEURONTIN ) capsule 800 mg, 800 mg, Oral, TID AC & HS, Awanda [place], MD, 800 mg at 11/14/24 1217   ipratropium-albuterol  (DUONEB) 0.5-2.5 (3) MG/3ML nebulizer solution 3 mL, 3 mL, Nebulization, Q4H PRN, Paudel, Keshab, MD   ipratropium-albuterol  (DUONEB) 0.5-2.5 (3) MG/3ML nebulizer solution 3 mL, 3 mL, Nebulization, QID, Awanda [place], MD   methylPREDNISolone  sodium succinate  (SOLU-MEDROL ) 40 mg/mL injection 40 mg, 40 mg, Intravenous, Q12H, Awanda [place], MD, 40 mg at 11/14/24 9177   metoprolol  succinate (TOPROL -XL) 24 hr tablet 50 mg, 50 mg, Oral, Daily, Awanda [place], MD, 50 mg at 11/14/24 1425   ondansetron  (ZOFRAN ) tablet 4 mg, 4 mg, Oral, Q6H PRN **OR** ondansetron  (ZOFRAN ) injection 4 mg, 4 mg, Intravenous, Q6H PRN, Paudel, Keshab, MD   pantoprazole  (PROTONIX ) EC tablet 40 mg, 40 mg, Oral, BID, Hallaji, Sheema M, RPH, 40 mg at 11/14/24 0805   polyethylene glycol (MIRALAX  / GLYCOLAX )  packet 17 g, 17 g, Oral, Daily PRN, Paudel, Keshab, MD   sodium chloride  HYPERTONIC 3 % nebulizer solution 4 mL, 4 mL, Nebulization, BID, Awanda [place], MD   traZODone  (DESYREL ) tablet 50 mg, 50 mg, Oral, QHS PRN, Paudel, Keshab, MD, 50 mg at 11/13/24 2126    ALLERGIES   Bee venom, Linezolid , and Azithromycin      REVIEW OF SYSTEMS    Review of Systems:  Gen:  Denies  fever, sweats, chills weigh loss  HEENT: Denies blurred vision, double vision,  ear pain, eye pain, hearing loss, nose bleeds, sore throat Cardiac:  No dizziness, chest pain or heaviness, chest tightness,edema Resp:   reports dyspnea chronically  Gi: Denies swallowing difficulty, stomach pain, nausea or vomiting, diarrhea, constipation, bowel incontinence Gu:  Denies bladder incontinence, burning urine Ext:   Denies Joint pain, stiffness or swelling Skin: Denies  skin rash, easy bruising or bleeding or hives Endoc:  Denies polyuria, polydipsia , polyphagia or weight change Psych:   Denies depression, insomnia or hallucinations   Other:  All other systems negative   VS: BP (!) 161/89   Pulse (!) 126   Temp 98.3 F (36.8 C) (Oral)   Resp (!) 27   Ht 5' 6 (1.676 m)   Wt 77 kg   SpO2 92%   BMI 27.40 kg/m      PHYSICAL EXAM    GENERAL:NAD, no fevers, chills, no weakness no fatigue HEAD: Normocephalic, atraumatic.  EYES: Pupils equal, round, reactive to light. Extraocular muscles intact. No scleral icterus.  MOUTH: Moist mucosal membrane. Dentition intact. No abscess noted.  EAR, NOSE, THROAT: Clear without exudates. No external lesions.  NECK: Supple. No thyromegaly. No nodules. No JVD.  PULMONARY: rhonchi bilaterally  CARDIOVASCULAR: S1 and S2. Regular rate and rhythm. No murmurs, rubs, or gallops. No edema. Pedal pulses 2+ bilaterally.  GASTROINTESTINAL: Soft, nontender, nondistended. No masses. Positive bowel sounds. No hepatosplenomegaly.  MUSCULOSKELETAL: No swelling, clubbing, or edema. Range of  motion full in all extremities.  NEUROLOGIC: Cranial nerves II through XII are intact. No gross focal neurological deficits. Sensation intact. Reflexes intact.  SKIN: No ulceration, lesions, rashes, or cyanosis. Skin warm and dry. Turgor intact.  PSYCHIATRIC: Mood, affect within normal limits. The patient is awake, alert and oriented x 3. Insight, judgment intact.       IMAGING     Narrative & Impression  CLINICAL DATA:  Non-small-cell lung cancer restaging * Tracking Code: BO *   EXAM: CT CHEST WITHOUT CONTRAST   TECHNIQUE: Multidetector CT imaging of the chest was performed following the standard protocol without IV contrast.   RADIATION DOSE REDUCTION: This exam was performed according to the departmental dose-optimization program which includes automated exposure control, adjustment of the mA and/or kV according to patient size and/or use of iterative reconstruction technique.   COMPARISON:  07/15/2024   FINDINGS: Cardiovascular: Aortic atherosclerosis. Normal heart size. Left coronary artery calcifications. Gross enlargement of the main pulmonary artery measuring up to 4.1 cm in caliber. No pericardial effusion.   Mediastinum/Nodes: Unchanged enlarged right hilar and pretracheal lymph nodes, right hilar nodes measuring up to 2.4 x 1.4 cm (series 2, image 71). Thyroid  gland, trachea, and esophagus demonstrate no significant findings.   Lungs/Pleura: Severe emphysema. Unchanged subpleural mass in the peripheral posterior left upper lobe measuring 2.5 x 2.1 cm (series 4, image 44). Interval resolution of consolidative airspace opacity in the right middle lobe (series 4, image 106) similar appearance of irregular opacity throughout the posterior right upper lobe. No pleural effusion or pneumothorax.   Upper Abdomen: No acute abnormality.   Musculoskeletal: No chest wall abnormality. No acute osseous findings.   IMPRESSION: 1. Unchanged subpleural mass in the  peripheral posterior left upper lobe measuring 2.5 x 2.1 cm. 2. Interval resolution of consolidative airspace opacity in the right middle lobe. Similar appearance of irregular opacity throughout the posterior right upper lobe. Findings are consistent with improved infection or inflammation. 3. Unchanged enlarged right hilar and pretracheal lymph nodes. 4. Severe emphysema. 5. Gross enlargement  of the main pulmonary artery, as can be seen in pulmonary hypertension. 6. Coronary artery disease.   Aortic Atherosclerosis (ICD10-I70.0) and Emphysema (ICD10-J43.9).     Electronically Signed   By: Marolyn JONETTA Jaksch M.D.   On: 09/20/2024 21:32    ASSESSMENT/PLAN   Acute on chronic hypoxemic respiratory failure - Chest x-ray performed today on admission shows no signs of pneumonia, pulmonary edema, pneumothorax, and previous left upper lobe lesion is stable from prior status post SBRT -He is being treated with steroids and antibiotics as well as nebulizer therapy for COPD exacerbation which I think is the right care path.  -CRP -mildly elevated  - proBNP - 20 panel respiratory viral testing- Parainfluenza Virus 1 DETECTED Abnormal    - I have dcd IV antibiotics and will continue zithromax  250 -will continue solumedrol 40 BID for now    Atelelctasis at bases bilaterally  - incentive spirometry and flutter valve     Thank you for allowing me to participate in the care of this patient.   Patient/Family are satisfied with care plan and all questions have been answered.    Provider disclosure: Patient with at least one acute or chronic illness or injury that poses a threat to life or bodily function and is being managed actively during this encounter.  All of the below services have been performed independently by signing provider:  review of prior documentation from internal and or external health records.  Review of previous and current lab results.  Interview and comprehensive  assessment during patient visit today. Review of current and previous chest radiographs/CT scans. Discussion of management and test interpretation with health care team and patient/family.   This document was prepared using Dragon voice recognition software and may include unintentional dictation errors.     Romin Divita, M.D.  Division of Pulmonary & Critical Care Medicine

## 2024-11-14 NOTE — Plan of Care (Signed)
  Problem: Education: Goal: Knowledge of General Education information will improve Description: Including pain rating scale, medication(s)/side effects and non-pharmacologic comfort measures Outcome: Progressing   Problem: Health Behavior/Discharge Planning: Goal: Ability to manage health-related needs will improve Outcome: Progressing   Problem: Clinical Measurements: Goal: Ability to maintain clinical measurements within normal limits will improve Outcome: Progressing Goal: Will remain free from infection Outcome: Progressing Goal: Diagnostic test results will improve Outcome: Progressing Goal: Respiratory complications will improve Outcome: Progressing Goal: Cardiovascular complication will be avoided Outcome: Progressing   Problem: Activity: Goal: Risk for activity intolerance will decrease Outcome: Progressing   Problem: Nutrition: Goal: Adequate nutrition will be maintained Outcome: Progressing   Problem: Coping: Goal: Level of anxiety will decrease Outcome: Progressing   Problem: Elimination: Goal: Will not experience complications related to bowel motility Outcome: Progressing Goal: Will not experience complications related to urinary retention Outcome: Progressing   Problem: Pain Managment: Goal: General experience of comfort will improve and/or be controlled Outcome: Progressing   Problem: Safety: Goal: Ability to remain free from injury will improve Outcome: Progressing   Problem: Skin Integrity: Goal: Risk for impaired skin integrity will decrease Outcome: Progressing   Problem: Education: Goal: Knowledge of disease or condition will improve Outcome: Progressing Goal: Knowledge of the prescribed therapeutic regimen will improve Outcome: Progressing Goal: Individualized Educational Video(s) Outcome: Progressing   Problem: Activity: Goal: Ability to tolerate increased activity will improve Outcome: Progressing Goal: Will verbalize the  importance of balancing activity with adequate rest periods Outcome: Progressing   Problem: Respiratory: Goal: Ability to maintain a clear airway will improve Outcome: Progressing Goal: Levels of oxygenation will improve Outcome: Progressing Goal: Ability to maintain adequate ventilation will improve Outcome: Progressing   Problem: Education: Goal: Ability to describe self-care measures that may prevent or decrease complications (Diabetes Survival Skills Education) will improve Outcome: Progressing Goal: Individualized Educational Video(s) Outcome: Progressing

## 2024-11-15 ENCOUNTER — Inpatient Hospital Stay

## 2024-11-15 DIAGNOSIS — J9612 Chronic respiratory failure with hypercapnia: Secondary | ICD-10-CM | POA: Diagnosis not present

## 2024-11-15 DIAGNOSIS — J9601 Acute respiratory failure with hypoxia: Secondary | ICD-10-CM | POA: Diagnosis not present

## 2024-11-15 LAB — BASIC METABOLIC PANEL WITH GFR
Anion gap: 8 (ref 5–15)
BUN: 40 mg/dL — ABNORMAL HIGH (ref 8–23)
CO2: 37 mmol/L — ABNORMAL HIGH (ref 22–32)
Calcium: 9.5 mg/dL (ref 8.9–10.3)
Chloride: 97 mmol/L — ABNORMAL LOW (ref 98–111)
Creatinine, Ser: 1.17 mg/dL (ref 0.61–1.24)
GFR, Estimated: >60 mL/min (ref >60)
Glucose, Bld: 171 mg/dL — ABNORMAL HIGH (ref 70–99)
Potassium: 4.7 mmol/L (ref 3.5–5.1)
Sodium: 143 mmol/L (ref 135–145)

## 2024-11-15 LAB — CBC
HCT: 45.6 % (ref 39.0–52.0)
Hemoglobin: 13.7 g/dL (ref 13.0–17.0)
MCH: 28.7 pg (ref 26.0–34.0)
MCHC: 30 g/dL (ref 30.0–36.0)
MCV: 95.4 fL (ref 80.0–100.0)
Platelets: 254 K/uL (ref 150–400)
RBC: 4.78 MIL/uL (ref 4.22–5.81)
RDW: 16.2 % — ABNORMAL HIGH (ref 11.5–15.5)
WBC: 31.7 K/uL — ABNORMAL HIGH (ref 4.0–10.5)
nRBC: 0.1 % (ref 0.0–0.2)

## 2024-11-15 LAB — PHOSPHORUS: Phosphorus: 3.1 mg/dL (ref 2.5–4.6)

## 2024-11-15 LAB — BLOOD GAS, VENOUS
Acid-Base Excess: 15.4 mmol/L — ABNORMAL HIGH (ref 0.0–2.0)
Bicarbonate: 44.4 mmol/L — ABNORMAL HIGH (ref 20.0–28.0)
Delivery systems: POSITIVE
FIO2: 50 %
O2 Saturation: 99 %
Patient temperature: 37
pCO2, Ven: 75 mmHg (ref 44–60)
pH, Ven: 7.38 (ref 7.25–7.43)
pO2, Ven: 113 mmHg — ABNORMAL HIGH (ref 32–45)

## 2024-11-15 LAB — GLUCOSE, CAPILLARY
Glucose-Capillary: 173 mg/dL — ABNORMAL HIGH (ref 70–99)
Glucose-Capillary: 96 mg/dL (ref 70–99)
Glucose-Capillary: 122 mg/dL — ABNORMAL HIGH (ref 70–99)
Glucose-Capillary: 88 mg/dL (ref 70–99)

## 2024-11-15 LAB — PROCALCITONIN: Procalcitonin: 0.2 ng/mL

## 2024-11-15 LAB — MAGNESIUM: Magnesium: 2 mg/dL (ref 1.7–2.4)

## 2024-11-15 MED ORDER — SODIUM CHLORIDE 0.9 % IV SOLN
500.0000 mg | INTRAVENOUS | Status: DC
  Administered 2024-11-15 – 2024-11-17 (×3): 500 mg via INTRAVENOUS
  Filled 2024-11-15 (×4): qty 5

## 2024-11-15 MED ORDER — METOPROLOL SUCCINATE ER 50 MG PO TB24
100.0000 mg | ORAL_TABLET | Freq: Once | ORAL | Status: AC
  Administered 2024-11-15: 100 mg via ORAL
  Filled 2024-11-15: qty 2

## 2024-11-15 MED ORDER — FUROSEMIDE 10 MG/ML IJ SOLN
40.0000 mg | Freq: Two times a day (BID) | INTRAMUSCULAR | Status: DC
  Administered 2024-11-15: 40 mg via INTRAVENOUS
  Filled 2024-11-15: qty 4

## 2024-11-15 MED ORDER — SODIUM CHLORIDE 0.9 % IV SOLN
2.0000 g | INTRAVENOUS | Status: DC
  Administered 2024-11-15: 2 g via INTRAVENOUS
  Filled 2024-11-15 (×2): qty 20

## 2024-11-15 MED ORDER — POLYVINYL ALCOHOL 1.4 % OP SOLN
1.0000 [drp] | OPHTHALMIC | Status: DC | PRN
  Administered 2024-11-16: 1 [drp] via OPHTHALMIC
  Filled 2024-11-15: qty 15

## 2024-11-15 MED ORDER — METHYLPREDNISOLONE SODIUM SUCC 40 MG IJ SOLR: 40.0000 mg | INTRAMUSCULAR | Status: DC

## 2024-11-15 MED ORDER — PROCHLORPERAZINE EDISYLATE 10 MG/2ML IJ SOLN
10.0000 mg | Freq: Once | INTRAMUSCULAR | Status: AC
  Administered 2024-11-15: 10 mg via INTRAVENOUS
  Filled 2024-11-15: qty 2

## 2024-11-15 MED ORDER — LABETALOL HCL 5 MG/ML IV SOLN: 10.0000 mg | INTRAVENOUS | Status: DC | PRN

## 2024-11-15 MED ORDER — INSULIN ASPART 100 UNIT/ML IJ SOLN: 0.0000 [IU] | Freq: Three times a day (TID) | INTRAMUSCULAR | Status: DC

## 2024-11-15 MED ORDER — IPRATROPIUM-ALBUTEROL 0.5-2.5 (3) MG/3ML IN SOLN
3.0000 mL | RESPIRATORY_TRACT | Status: DC
  Administered 2024-11-15 – 2024-11-16 (×5): 3 mL via RESPIRATORY_TRACT
  Filled 2024-11-15 (×4): qty 3

## 2024-11-15 NOTE — Progress Notes (Signed)
 PROGRESS NOTE    Christian Fields  FMW:[id number] DOB: 21-Oct-1956 DOA: 11/12/2024 PCP: Lora Odor, FNP  [id number]  LOS: 3 days   Brief hospital course:   Assessment & Plan: Christian Fields is a pleasant 68 y.o. male with medical history significant for COPD on home oxygen  4 L, non-small cell lung cancer s/p RXT, OSA on CPAP, GERD, history of TIA, asthma, diabetes type 2, HLD, HTN, recent history of admission and discharge from [facility] between 11/03/2024 to 11/07/2024 after being treated for COPD exacerbation brought in today for shortness of breath started last night.  He stated that he finished his antibiotics cefdinir and doxycycline   couple days ago.  EMS gave him 3 DuoNebs, 2 g of magnesium  and 125 mg of Solu-Medrol  IV.  Initial oxygen  was 60 to 70% on 4 L of nasal cannula oxygen .  Patient was placed on nonrebreather.   In the ED, patient is found to be tachypneic, tachycardic and with increased work of breathing.  Chest x-ray did not show obvious consolidation.  VBG showed pCO2 of 80 and was placed on BiPAP.     1.  Acute on chronic hypoxemic and hypercapnic respiratory failure likely due to  COPD exacerbation 2/2 Parainfluenza Virus 1 infection On 4L O2 at baseline --pulm consulted on admission --transferred to stepdown on 12/7 due to more dyspnea and respiratory distress, with increased RR and work of breathing.  Pt was placed back on BiPAP.  --procal 0.18 and 0.2.  Repeat CXR unchanged. --cont IV solumedrol 40 q12h --cont azithro 250 mg daily --DuoNeb QID --cont hypertonic saline neb BID --start ceftriaxone  and azithro --Continue supplemental O2 to keep sats >=90%  Sepsis ruled out CAP ruled out --CXR showed no acute finding.  Procal low x2.  D/c'ed zosyn   3.  History of lung cancer s/p radiation therapy - Follow-up as outpatient with oncologist   4.  Sleep apnea on CPAP  --BiPAP nightly while inpatient   5.  Type 2 diabetes Hyperglycemia due to steroid --ACHS  and SSI mod scale   6.  Hypertension and sinus tachycardia --BP and HR elevated, likely due to stress --EKG showed sinus tach --cont home Toprol  at increased 100 mg daily --if unable to take oral, IV labetalol  PRN with parameters   7.  Combined Congestive heart failure --Echo in March 2025 showed LVEF 40-45% with grad I DD. --cont IV lasix  40 BID  8.  Depression - cont Cymbalta   Chronic pain on chronic opioids --cont home suboxone  and gabapentin    DVT prophylaxis: Lovenox  SQ Code Status: Full code  Family Communication:  Level of care: Stepdown Dispo:   The patient is from: home Anticipated d/c is to: home Anticipated d/c date is: >3 days   Subjective and Interval History:  Per RN, pt had more work of breathing with retraction of his chest muscles.  Pt however denied much distress.     Objective: Vitals:   11/15/24 1500 11/15/24 1524 11/15/24 1600 11/15/24 1700  BP: 100/75  (!) 105/59 133/81  Pulse: 79  84 91  Resp: 13  12 (!) 22  Temp:   97.9 F (36.6 C)   TempSrc:   Axillary   SpO2: 96% 96% 94% 97%  Weight:      Height:        Intake/Output Summary (Last 24 hours) at 11/15/2024 1811 Last data filed at 11/15/2024 1630 Gross per 24 hour  Intake 350 ml  Output 2175 ml  Net -1825 ml   Christian Fields  Weights   11/12/24 1353 11/14/24 1818  Weight: 77 kg 75.8 kg    Examination:   Constitutional: NAD, alert, oriented HEENT: conjunctivae and lids normal, EOMI CV: No cyanosis.   RESP: increased RR, increased work-of-breathing, on BiPAP   Data Reviewed: I have personally reviewed labs and imaging studies  Time spent: 50 minutes  Ellouise Haber, MD [facility] If 7PM-7AM, please contact night-coverage 11/15/2024, 6:11 PM

## 2024-11-15 NOTE — Plan of Care (Signed)

## 2024-11-15 NOTE — Progress Notes (Signed)
 Pt experiencing nausea. Given zofran  at 530 and felt no relief.  Pt also hypertensive. PRN hydralazine  was given which did not affect the blood pressures.  MD messaged and nurse was informed to give morning antihypertensive medications early. Nurse gave 100mg  of metoprolol .  After taking the medications, pt got more nauseous and threw up metoprolol .  MD messaged for advice.

## 2024-11-15 NOTE — Progress Notes (Signed)
 Pt ordered Metaneb TID, pt is currently on Bipap and meta neb cannot be done with bipap on. MD notified, will start metaneb once pt is weaned off of bipap.

## 2024-11-15 NOTE — Progress Notes (Signed)
 PULMONOLOGY         Date: 11/15/2024,   MRN# [id number] Christian Fields 05-15-1956     AdmissionWeight: 77 kg                 CurrentWeight: 75.8 kg  Referring provider: Dr Roann   CHIEF COMPLAINT:   Recurrent acute exacerbation of COPD with lung cancer   HISTORY OF PRESENT ILLNESS   This is 68 year old male with a history of lifelong smoking and advanced COPD, had a diagnosis of stage I left upper lobe lung cancer after noticing an interval increase in the size of nodule from 1 cm to 1.4 in 2021.  He went straight to SBRT due to being a poor surgical candidate for biopsy.  He also is having recurrent COPD exacerbations and having rehospitalization for this.  He has chronic hypoxemia and uses oxygen  at home up to 5 L/min.  He has chronic physical deconditioning.  He has chronic peripheral edema and also follows up with cardiology for essential hypertension and dyslipidemia.  He also has a background history of CKD 3 A, sleep apnea noncompliant with CPAP therapy, polysubstance abuse and abdominal aortic aneurysm.  He came to the hospital on this admission with complaints of worsening shortness of breath x 1 day was noted to have oxygen  saturation approximately 60% on his usual 4 L/min nasal cannula he denies sick contacts or fevers at home.  He had a venous blood gas performed in the ED which showed mild acidemia with hypercapnia.  He was treated with nebulizer therapy and steroids as well as BiPAP and reports slight improvement since then.  He is started on empiric antibiotics with cefepime  and vancomycin  for possible pneumonia.  PCCM consultation placed due to recurrent hospitalization with advanced COPD, chronic hypoxemia and lung cancer.  11/14/24-patient with Parainfluenza induced AECOPD.  He was moved to Step down unit due to progressive dyspnea and is on BIPAP. He is on solumedrol BID and nebulizer therapy. Will continue to follow. Anticipate slow improvement over next 3-5  weeks with this virus. Hopefully dc in 3-4days.  PAST MEDICAL HISTORY   Past Medical History:  Diagnosis Date   Anemia    Anxiety    Arthritis    Asthma    Cancer (HCC)    Basal Cell Skin Cancer   Chronic back pain    COPD (chronic obstructive pulmonary disease) (HCC)    Depression    Diabetes mellitus (HCC)    Dyspnea    GERD (gastroesophageal reflux disease)    Gout    Gout    Headache    History of blood clots    Left Leg--July 2018   History of kidney stones    Hyperlipidemia    Hyperlipidemia    Hypertension    Kidney stones    Neuropathy    On home oxygen  therapy    2 L / M   Pneumonia 06/2017   Sleep apnea    Ulcer of foot (HCC)    Right     SURGICAL HISTORY   Past Surgical History:  Procedure Laterality Date   APPENDECTOMY     DG FEET 2 VIEWS BILAT     IRRIGATION AND DEBRIDEMENT FOOT Left 07/11/2023   Procedure: IRRIGATION AND DEBRIDEMENT FOOT;  Surgeon: Ashley Soulier, DPM;  Location: [facility];  Service: Orthopedics/Podiatry;  Laterality: Left;   LIPOMA EXCISION Right 08/15/2017   Procedure: EXCISION TUMOR(CYST) FOOT;  Surgeon: Lilli Cough, DPM;  Location: [facility];  Service: Podiatry;  Laterality: Right;   LOWER EXTREMITY ANGIOGRAPHY Left 07/03/2023   Procedure: Lower Extremity Angiography;  Surgeon: Marea Selinda RAMAN, MD;  Location: [facility];  Service: Cardiovascular;  Laterality: Left;   OTHER SURGICAL HISTORY Bilateral Foot surgery     FAMILY HISTORY   Family History  Problem Relation Age of Onset   Hypertension Mother      SOCIAL HISTORY   Social History   Tobacco Use   Smoking status: Former    Current packs/day: 0.50    Average packs/day: 0.5 packs/day for 7.9 years (4.0 ttl pk-yrs)    Types: Cigarettes    Start date: 2018   Smokeless tobacco: Never  Vaping Use   Vaping status: Never Used  Substance Use Topics   Alcohol  use: Yes    Alcohol /week: 1.0 standard drink of alcohol     Types: 1 Cans of beer per week     Comment: occ   Drug use: No     MEDICATIONS    Home Medication:    Current Medication:  Current Facility-Administered Medications:    acetaminophen  (TYLENOL ) tablet 650 mg, 650 mg, Oral, Q6H PRN **OR** acetaminophen  (TYLENOL ) suppository 650 mg, 650 mg, Rectal, Q6H PRN, Paudel, Keshab, MD   aspirin  EC tablet 81 mg, 81 mg, Oral, Daily, Paudel, Keshab, MD, 81 mg at 11/14/24 0805   azithromycin  (ZITHROMAX ) tablet 250 mg, 250 mg, Oral, Daily, Awanda [place], MD, 250 mg at 11/14/24 0805   buprenorphine -naloxone  (SUBOXONE ) 8-2 mg per SL tablet 1 tablet, 1 tablet, Sublingual, TID, Awanda [place], MD, 1 tablet at 11/15/24 0818   Chlorhexidine  Gluconate Cloth 2 % PADS 6 each, 6 each, Topical, Q0600, Awanda [place], MD, 6 each at 11/15/24 0541   clopidogrel  (PLAVIX ) tablet 75 mg, 75 mg, Oral, Daily, Paudel, Keshab, MD, 75 mg at 11/14/24 0807   DULoxetine  (CYMBALTA ) DR capsule 60 mg, 60 mg, Oral, Daily, Paudel, Keshab, MD, 60 mg at 11/14/24 0804   enoxaparin  (LOVENOX ) injection 40 mg, 40 mg, Subcutaneous, Q24H, Paudel, Keshab, MD, 40 mg at 11/14/24 2116   ezetimibe  (ZETIA ) tablet 10 mg, 10 mg, Oral, Daily, Paudel, Keshab, MD, 10 mg at 11/14/24 0805   gabapentin  (NEURONTIN ) capsule 800 mg, 800 mg, Oral, TID AC & HS, Awanda [place], MD, 800 mg at 11/15/24 0818   hydrALAZINE  (APRESOLINE ) injection 5 mg, 5 mg, Intravenous, Q4H PRN, Cleatus Delayne GAILS, MD, 5 mg at 11/15/24 0545   insulin  aspart (novoLOG ) injection 0-20 Units, 0-20 Units, Subcutaneous, TID WC, Lai, Tina, MD, 4 Units at 11/15/24 0753   insulin  aspart (novoLOG ) injection 0-5 Units, 0-5 Units, Subcutaneous, QHS, Awanda City, MD   ipratropium-albuterol  (DUONEB) 0.5-2.5 (3) MG/3ML nebulizer solution 3 mL, 3 mL, Nebulization, Q4H PRN, Paudel, Keshab, MD   ipratropium-albuterol  (DUONEB) 0.5-2.5 (3) MG/3ML nebulizer solution 3 mL, 3 mL, Nebulization, QID, Awanda City, MD, 3 mL at 11/15/24 9176   labetalol  (NORMODYNE ) injection 10 mg, 10 mg, Intravenous, Q4H PRN, Awanda City,  MD   methylPREDNISolone  sodium succinate  (SOLU-MEDROL ) 40 mg/mL injection 40 mg, 40 mg, Intravenous, Q12H, Awanda [place], MD, 40 mg at 11/15/24 9178   metoprolol  succinate (TOPROL -XL) 24 hr tablet 100 mg, 100 mg, Oral, Daily, Awanda [place], MD, 100 mg at 11/15/24 0645   ondansetron  (ZOFRAN ) tablet 4 mg, 4 mg, Oral, Q6H PRN **OR** ondansetron  (ZOFRAN ) injection 4 mg, 4 mg, Intravenous, Q6H PRN, Paudel, Keshab, MD, 4 mg at 11/15/24 0530   pantoprazole  (PROTONIX ) EC tablet 40 mg, 40 mg, Oral, BID, Hallaji, Sheema  M, RPH, 40 mg at 11/14/24 2116   polyethylene glycol (MIRALAX  / GLYCOLAX ) packet 17 g, 17 g, Oral, Daily PRN, Paudel, Keshab, MD   traZODone  (DESYREL ) tablet 50 mg, 50 mg, Oral, QHS PRN, Paudel, Keshab, MD, 50 mg at 11/14/24 2116    ALLERGIES   Bee venom, Linezolid , and Azithromycin      REVIEW OF SYSTEMS    Review of Systems:  Gen:  Denies  fever, sweats, chills weigh loss  HEENT: Denies blurred vision, double vision, ear pain, eye pain, hearing loss, nose bleeds, sore throat Cardiac:  No dizziness, chest pain or heaviness, chest tightness,edema Resp:   reports dyspnea chronically  Gi: Denies swallowing difficulty, stomach pain, nausea or vomiting, diarrhea, constipation, bowel incontinence Gu:  Denies bladder incontinence, burning urine Ext:   Denies Joint pain, stiffness or swelling Skin: Denies  skin rash, easy bruising or bleeding or hives Endoc:  Denies polyuria, polydipsia , polyphagia or weight change Psych:   Denies depression, insomnia or hallucinations   Other:  All other systems negative   VS: BP (!) 156/90   Pulse (!) 114   Temp 98.3 F (36.8 C) (Oral)   Resp 19   Ht 5' 6 (1.676 m)   Wt 75.8 kg   SpO2 (!) 88%   BMI 26.97 kg/m      PHYSICAL EXAM    GENERAL:NAD, no fevers, chills, no weakness no fatigue HEAD: Normocephalic, atraumatic.  EYES: Pupils equal, round, reactive to light. Extraocular muscles intact. No scleral icterus.  MOUTH: Moist mucosal  membrane. Dentition intact. No abscess noted.  EAR, NOSE, THROAT: Clear without exudates. No external lesions.  NECK: Supple. No thyromegaly. No nodules. No JVD.  PULMONARY: rhonchi bilaterally  CARDIOVASCULAR: S1 and S2. Regular rate and rhythm. No murmurs, rubs, or gallops. No edema. Pedal pulses 2+ bilaterally.  GASTROINTESTINAL: Soft, nontender, nondistended. No masses. Positive bowel sounds. No hepatosplenomegaly.  MUSCULOSKELETAL: No swelling, clubbing, or edema. Range of motion full in all extremities.  NEUROLOGIC: Cranial nerves II through XII are intact. No gross focal neurological deficits. Sensation intact. Reflexes intact.  SKIN: No ulceration, lesions, rashes, or cyanosis. Skin warm and dry. Turgor intact.  PSYCHIATRIC: Mood, affect within normal limits. The patient is awake, alert and oriented x 3. Insight, judgment intact.       IMAGING     Narrative & Impression  CLINICAL DATA:  Non-small-cell lung cancer restaging * Tracking Code: BO *   EXAM: CT CHEST WITHOUT CONTRAST   TECHNIQUE: Multidetector CT imaging of the chest was performed following the standard protocol without IV contrast.   RADIATION DOSE REDUCTION: This exam was performed according to the departmental dose-optimization program which includes automated exposure control, adjustment of the mA and/or kV according to patient size and/or use of iterative reconstruction technique.   COMPARISON:  07/15/2024   FINDINGS: Cardiovascular: Aortic atherosclerosis. Normal heart size. Left coronary artery calcifications. Gross enlargement of the main pulmonary artery measuring up to 4.1 cm in caliber. No pericardial effusion.   Mediastinum/Nodes: Unchanged enlarged right hilar and pretracheal lymph nodes, right hilar nodes measuring up to 2.4 x 1.4 cm (series 2, image 71). Thyroid  gland, trachea, and esophagus demonstrate no significant findings.   Lungs/Pleura: Severe emphysema. Unchanged subpleural mass  in the peripheral posterior left upper lobe measuring 2.5 x 2.1 cm (series 4, image 44). Interval resolution of consolidative airspace opacity in the right middle lobe (series 4, image 106) similar appearance of irregular opacity throughout the posterior right upper lobe. No pleural  effusion or pneumothorax.   Upper Abdomen: No acute abnormality.   Musculoskeletal: No chest wall abnormality. No acute osseous findings.   IMPRESSION: 1. Unchanged subpleural mass in the peripheral posterior left upper lobe measuring 2.5 x 2.1 cm. 2. Interval resolution of consolidative airspace opacity in the right middle lobe. Similar appearance of irregular opacity throughout the posterior right upper lobe. Findings are consistent with improved infection or inflammation. 3. Unchanged enlarged right hilar and pretracheal lymph nodes. 4. Severe emphysema. 5. Gross enlargement of the main pulmonary artery, as can be seen in pulmonary hypertension. 6. Coronary artery disease.   Aortic Atherosclerosis (ICD10-I70.0) and Emphysema (ICD10-J43.9).     Electronically Signed   By: Marolyn JONETTA Jaksch M.D.   On: 09/20/2024 21:32    ASSESSMENT/PLAN   Acute on chronic hypoxemic respiratory failure - Chest x-ray performed today on admission shows no signs of pneumonia, pulmonary edema, pneumothorax, and previous left upper lobe lesion is stable from prior status post SBRT -He is being treated with steroids and antibiotics as well as nebulizer therapy for COPD exacerbation which I think is the right care path.  -CRP -mildly elevated  - proBNP - 20 panel respiratory viral testing- Parainfluenza Virus 1 DETECTED Abnormal    - I have dcd IV antibiotics and will continue zithromax  250 -will continue solumedrol 40 BID for now    Atelelctasis at bases bilaterally  - incentive spirometry and flutter valve     Thank you for allowing me to participate in the care of this patient.   Patient/Family are  satisfied with care plan and all questions have been answered.    Provider disclosure: Patient with at least one acute or chronic illness or injury that poses a threat to life or bodily function and is being managed actively during this encounter.  All of the below services have been performed independently by signing provider:  review of prior documentation from internal and or external health records.  Review of previous and current lab results.  Interview and comprehensive assessment during patient visit today. Review of current and previous chest radiographs/CT scans. Discussion of management and test interpretation with health care team and patient/family.   This document was prepared using Dragon voice recognition software and may include unintentional dictation errors.     Fedora Knisely, M.D.  Division of Pulmonary & Critical Care Medicine

## 2024-11-15 NOTE — Consult Note (Signed)
 NAME:  Christian Fields, MRN:  [id number], DOB:  02-07-1956, LOS: 3 ADMISSION DATE:  11/12/2024, CONSULTATION DATE:  11/15/2024 REFERRING MD:  Dr. Awanda, CHIEF COMPLAINT:  Acute Respiratory Distress   Brief Pt Description / Synopsis:  68 y.o. male with PMHx significant for COPD and lung cancer admitted with Acute on Chronic Hypoxic and Hypercapnic Respiratory Failure due to Acute COPD Exacerbation due to Parainfluenza Infection requiring BiPAP.   History of Present Illness:  Christian Fields is a 68 year old male with a history of lifelong smoking and advanced COPD, had a diagnosis of stage I left upper lobe lung cancer after noticing an interval increase in the size of nodule from 1 cm to 1.4 in 2021. He went straight to SBRT due to being a poor surgical candidate for biopsy. He also is having recurrent COPD exacerbations and having rehospitalization for this. He has chronic hypoxemia and uses oxygen  at home up to 5 L/min. He has chronic physical deconditioning. He has chronic peripheral edema and also follows up with cardiology for essential hypertension and dyslipidemia. He also has a background history of CKD 3 A, sleep apnea noncompliant with CPAP therapy, polysubstance abuse and abdominal aortic aneurysm. He came to the hospital on this admission with complaints of worsening shortness of breath x 1 day was noted to have oxygen  saturation approximately 60% on his usual 4 L/min nasal cannula he denies sick contacts or fevers at home. He had a venous blood gas performed in the ED which showed mild acidemia with hypercapnia. He was treated with nebulizer therapy and steroids as well as BiPAP and reports slight improvement since then. He is started on empiric antibiotics with cefepime  and vancomycin  for possible pneumonia. PCCM consultation placed due to recurrent hospitalization with advanced COPD, chronic hypoxemia and lung cancer.    Please see Significant Hospital Events section below for full detailed  hospital course.   Pertinent  Medical History   Past Medical History:  Diagnosis Date   Anemia    Anxiety    Arthritis    Asthma    Cancer (HCC)    Basal Cell Skin Cancer   Chronic back pain    COPD (chronic obstructive pulmonary disease) (HCC)    Depression    Diabetes mellitus (HCC)    Dyspnea    GERD (gastroesophageal reflux disease)    Gout    Gout    Headache    History of blood clots    Left Leg--July 2018   History of kidney stones    Hyperlipidemia    Hyperlipidemia    Hypertension    Kidney stones    Neuropathy    On home oxygen  therapy    2 L / M   Pneumonia 06/2017   Sleep apnea    Ulcer of foot (HCC)    Right    Micro Data:  12/5: COVID/FLU/RSV PCR>> negative 12/5: RVP >> + Parainfluenza   Antimicrobials:   Anti-infectives (From admission, onward)    Start     Dose/Rate Route Frequency Ordered Stop   11/15/24 1215  azithromycin  (ZITHROMAX ) 500 mg in sodium chloride  0.9 % 250 mL IVPB        500 mg 250 mL/hr over 60 Minutes Intravenous Every 24 hours 11/15/24 1129     11/15/24 1215  cefTRIAXone  (ROCEPHIN ) 2 g in sodium chloride  0.9 % 100 mL IVPB        2 g 200 mL/hr over 30 Minutes Intravenous Every 24 hours 11/15/24 1129  11/13/24 1900  vancomycin  (VANCOREADY) IVPB 1250 mg/250 mL  Status:  Discontinued        1,250 mg 166.7 mL/hr over 90 Minutes Intravenous Every 24 hours 11/12/24 1744 11/12/24 1749   11/13/24 1000  azithromycin  (ZITHROMAX ) tablet 250 mg  Status:  Discontinued        250 mg Oral Daily 11/13/24 0819 11/15/24 1129   11/13/24 0500  piperacillin -tazobactam (ZOSYN ) IVPB 3.375 g  Status:  Discontinued        3.375 g 12.5 mL/hr over 240 Minutes Intravenous Every 8 hours 11/12/24 2112 11/13/24 0814   11/12/24 1900  vancomycin  (VANCOREADY) IVPB 1750 mg/350 mL  Status:  Discontinued        1,750 mg 175 mL/hr over 120 Minutes Intravenous  Once 11/12/24 1744 11/12/24 1749   11/12/24 1700  ceFEPIme  (MAXIPIME ) 2 g in sodium chloride  0.9  % 100 mL IVPB  Status:  Discontinued        2 g 200 mL/hr over 30 Minutes Intravenous Every 12 hours 11/12/24 1615 11/12/24 1745       Significant Hospital Events: Including procedures, antibiotic start and stop dates in addition to other pertinent events   12/5: Admitted by [facility].  Pulmonary consulted for severe AECOPD requiring BiPAP. 11/14/24-patient with Parainfluenza induced AECOPD. He was moved to Step down unit due to progressive dyspnea and is on BIPAP. He is on solumedrol BID and nebulizer therapy. Will continue to follow. Anticipate slow improvement over next 3-5 weeks with this virus. Hopefully dc in 3-4days.  11/15/24: Mid morning pt with increased WOB and accessory muscle use.  PCCM consulted.  Given 40 mg Lasix  by [name].  Increase duonebs to q4h and start Ceftriaxone  and increase Azithromycin  dose to 500 mg.  High risk for intubation.   Interim History / Subjective:  As outlined above under Significant Hospital Events section  Objective   Blood pressure (!) 157/105, pulse (!) 107, temperature 98.3 F (36.8 C), temperature source Oral, resp. rate (!) 27, height 5' 6 (1.676 m), weight 75.8 kg, SpO2 94%.    FiO2 (%):  [35 %-50 %] 50 % Pressure Support:  [5 cmH20] 5 cmH20   Intake/Output Summary (Last 24 hours) at 11/15/2024 1132 Last data filed at 11/15/2024 0700 Gross per 24 hour  Intake --  Output 2050 ml  Net -2050 ml   Filed Weights   11/12/24 1353 11/14/24 1818  Weight: 77 kg 75.8 kg    Examination: General: Acute on chronically ill appearing male, sitting in bed, on BiPAP, with moderate respiratory distress HENT: Atraumatic, normocephalic, neck supple, no JVD Lungs: severely diminished throughout with faint rhonchi on the right, BiPAP assisted, even, tachypnea, + assessory muscle use Cardiovascular: Regular rate and rhythm, s1s2, no M/R/G Abdomen: Soft, nontender, nondistended, no guarding or rebound tenderness, BS+ x4 Extremities: Normal bulk and tone, no  deformities, warm and well perfused, trace edema BLE Neuro: Awake and alert, moves all extremities purposefully, no focal deficits GU: Deferred   Resolved Hospital Problem list     Assessment & Plan:   #Acute on chronic Hypoxic & Hypercapnic Respiratory Failure #Acute COPD Exacerbation due to Parainfluenza Viral Infection #Lung Cancer -Supplemental O2 as needed to maintain O2 sats 88 to 92% -BiPAP, wean as tolerated ~ HIGH RISK FOR INTUBATION  -Follow intermittent Chest X-ray & ABG as needed -Bronchodilators q4h -IV Steroids (Solumedrol 40 mg BID) -ABX as above ~ start Ceftriaxone  and increase Azithromycin  to 500 mg daily for now given severe COPD -Diuresis as BP  and renal function permits ~ given 40 mg Lasix  12/8 by [name] -Pulmonary toilet as able     Best Practice (right click and Reselect all SmartList Selections daily)   Diet/type: NPO until respiratory status improved  DVT prophylaxis: LMWH GI prophylaxis: PPI Lines: N/A Foley:  N/A Code Status:  full code Last date of multidisciplinary goals of care discussion [N/A]  12/8: Pt updated at bedside on plan of care.  Labs   CBC: Recent Labs  Lab 11/12/24 1408 11/13/24 0017 11/14/24 0459 11/15/24 0636  WBC 25.5* 12.7* 24.0* 31.7*  HGB 13.5 12.0* 13.1 13.7  HCT 45.0 40.4 44.8 45.6  MCV 95.9 96.4 98.5 95.4  PLT 288 256 274 254    Basic Metabolic Panel: Recent Labs  Lab 11/12/24 1434 11/13/24 0017 11/14/24 0459 11/15/24 0636  NA 139 138 137 143  K 4.5 4.8 4.7 4.7  CL 97* 98 99 97*  CO2 33* 27 26 37*  GLUCOSE 191* 341* 219* 171*  BUN 38* 42* 34* 40*  CREATININE 1.25* 1.47* 1.18 1.17  CALCIUM  9.2 8.5* 8.6* 9.5  MG  --   --  2.4 2.0  PHOS  --   --   --  3.1   GFR: Estimated Creatinine Clearance: 53.8 mL/min (by C-G formula based on SCr of 1.17 mg/dL). Recent Labs  Lab 11/12/24 1408 11/13/24 0017 11/13/24 1106 11/14/24 0459 11/14/24 1704 11/15/24 0636  PROCALCITON  --   --  0.23  --  0.18  --    WBC 25.5* 12.7*  --  24.0*  --  31.7*    Liver Function Tests: Recent Labs  Lab 11/12/24 1434 11/13/24 0017  AST 19 18  ALT 26 21  ALKPHOS 113 102  BILITOT 0.3 <0.2  PROT 7.1 6.3*  ALBUMIN  4.2 3.7   No results for input(s): LIPASE, AMYLASE in the last 168 hours. No results for input(s): AMMONIA in the last 168 hours.  ABG    Component Value Date/Time   PHART 7.47 (H) 03/08/2021 1048   PCO2ART 39 03/08/2021 1048   PO2ART 59 (L) 03/08/2021 1048   HCO3 44.4 (H) 11/15/2024 1114   ACIDBASEDEF 1.4 11/03/2024 1854   O2SAT 99 11/15/2024 1114     Coagulation Profile: Recent Labs  Lab 11/13/24 0017  INR 1.0    Cardiac Enzymes: No results for input(s): CKTOTAL, CKMB, CKMBINDEX, TROPONINI in the last 168 hours.  HbA1C: Hemoglobin A1C  Date/Time Value Ref Range Status  09/25/2013 03:56 AM 6.3 4.2 - 6.3 % Final    Comment:    The American Diabetes Association recommends that a primary goal of therapy should be <7% and that physicians should reevaluate the treatment regimen in patients with HbA1c values consistently >8%.    Hgb A1c MFr Bld  Date/Time Value Ref Range Status  07/16/2024 03:19 AM 6.0 (H) 4.8 - 5.6 % Final    Comment:    (NOTE)         Prediabetes: 5.7 - 6.4         Diabetes: >6.4         Glycemic control for adults with diabetes: <7.0   10/17/2023 04:51 AM 5.7 (H) 4.8 - 5.6 % Final    Comment:    (NOTE) Pre diabetes:          5.7%-6.4%  Diabetes:              >6.4%  Glycemic control for   <7.0% adults with diabetes     CBG:  Recent Labs  Lab 11/12/24 1752 11/14/24 1818 11/14/24 2227 11/15/24 0722 11/15/24 1110  GLUCAP 235* 203* 192* 173* 96    Review of Systems:   Positives in BOLD: Gen: Denies fever, chills, weight change, fatigue, night sweats HEENT: Denies blurred vision, double vision, hearing loss, tinnitus, sinus congestion, rhinorrhea, sore throat, neck stiffness, dysphagia PULM: Denies shortness of breath,  cough, sputum production, hemoptysis, wheezing CV: Denies chest pain, edema, orthopnea, paroxysmal nocturnal dyspnea, palpitations GI: Denies abdominal pain, nausea, vomiting, diarrhea, hematochezia, melena, constipation, change in bowel habits GU: Denies dysuria, hematuria, polyuria, oliguria, urethral discharge Endocrine: Denies hot or cold intolerance, polyuria, polyphagia or appetite change Derm: Denies rash, dry skin, scaling or peeling skin change Heme: Denies easy bruising, bleeding, bleeding gums Neuro: Denies headache, numbness, weakness, slurred speech, loss of memory or consciousness   Past Medical History:  He,  has a past medical history of Anemia, Anxiety, Arthritis, Asthma, Cancer (HCC), Chronic back pain, COPD (chronic obstructive pulmonary disease) (HCC), Depression, Diabetes mellitus (HCC), Dyspnea, GERD (gastroesophageal reflux disease), Gout, Gout, Headache, History of blood clots, History of kidney stones, Hyperlipidemia, Hyperlipidemia, Hypertension, Kidney stones, Neuropathy, On home oxygen  therapy, Pneumonia (06/2017), Sleep apnea, and Ulcer of foot (HCC).   Surgical History:   Past Surgical History:  Procedure Laterality Date   APPENDECTOMY     DG FEET 2 VIEWS BILAT     IRRIGATION AND DEBRIDEMENT FOOT Left 07/11/2023   Procedure: IRRIGATION AND DEBRIDEMENT FOOT;  Surgeon: Ashley Soulier, DPM;  Location: [facility] ORS;  Service: Orthopedics/Podiatry;  Laterality: Left;   LIPOMA EXCISION Right 08/15/2017   Procedure: EXCISION TUMOR(CYST) FOOT;  Surgeon: Lilli Cough, DPM;  Location: [facility] ORS;  Service: Podiatry;  Laterality: Right;   LOWER EXTREMITY ANGIOGRAPHY Left 07/03/2023   Procedure: Lower Extremity Angiography;  Surgeon: Marea Selinda RAMAN, MD;  Location: [facility] INVASIVE CV LAB;  Service: Cardiovascular;  Laterality: Left;   OTHER SURGICAL HISTORY Bilateral Foot surgery     Social History:   reports that he has quit smoking. His smoking use included cigarettes. He started  smoking about 7 years ago. He has a 4 pack-year smoking history. He has never used smokeless tobacco. He reports current alcohol  use of about 1.0 standard drink of alcohol  per week. He reports that he does not use drugs.   Family History:  His family history includes Hypertension in his mother.   Allergies Allergies  Allergen Reactions   Bee Venom Anaphylaxis   Linezolid  Other (See Comments)    Thrombocytopenia, hyperlactatemia   Azithromycin  Itching and Rash     Home Medications  Prior to Admission medications   Medication Sig Start Date End Date Taking? Authorizing Provider  acetaminophen  (TYLENOL ) 325 MG tablet Take 650 mg by mouth every 6 (six) hours as needed.   Yes [provider]  albuterol  (VENTOLIN  HFA) 108 (90 Base) MCG/ACT inhaler Inhale 2 puffs into the lungs every 4 (four) hours as needed for wheezing or shortness of breath.   Yes [provider]  allopurinol  (ZYLOPRIM ) 300 MG tablet Take 600 mg by mouth daily. 02/24/20  Yes [provider]  aspirin  EC 81 MG tablet Take 1 tablet by mouth daily.   Yes [provider]  azelastine  (ASTELIN ) 0.1 % nasal spray Place 2 sprays into both nostrils 2 (two) times daily as needed. 05/27/23  Yes [provider]  Buprenorphine  HCl-Naloxone  HCl 8-2 MG FILM Place 1 Film under the tongue in the morning, at noon, and at bedtime.   Yes [provider]  clopidogrel  (PLAVIX ) 75 MG tablet Take 1 tablet (75 mg total) by mouth daily. 02/02/21  Yes Leotis Bogus, MD  dapagliflozin  propanediol (FARXIGA ) 10 MG TABS tablet Take 1 tablet by mouth daily. 03/16/24 03/16/25 Yes [provider]  DULoxetine  (CYMBALTA ) 60 MG capsule Take 60 mg by mouth daily.   Yes [provider]  ezetimibe  (ZETIA ) 10 MG tablet Take 10 mg by mouth daily. 02/24/20  Yes [provider]  fluticasone  (FLONASE ) 50 MCG/ACT nasal spray Place 1 spray into both nostrils daily as needed for allergies. 10/16/18   Yes [provider]  Fluticasone -Umeclidin-Vilant (TRELEGY ELLIPTA) 100-62.5-25 MCG/ACT AEPB Inhale 1 puff into the lungs in the morning. 09/12/21  Yes [provider]  furosemide  (LASIX ) 20 MG tablet Take 1 tablet (20 mg total) by mouth daily. 02/23/24  Yes Jens Durand, MD  gabapentin  (NEURONTIN ) 800 MG tablet Take 800 mg by mouth 4 (four) times daily. 10/22/23  Yes [provider]  ipratropium-albuterol  (DUONEB) 0.5-2.5 (3) MG/3ML SOLN Take 3 mLs by nebulization every 6 (six) hours as needed (wheezing).   Yes [provider]  metFORMIN  (GLUCOPHAGE ) 1000 MG tablet Take 1,000 mg by mouth daily.   Yes [provider]  metoprolol  succinate (TOPROL -XL) 50 MG 24 hr tablet Take 50 mg by mouth daily. 02/24/20  Yes [provider]  Multiple Vitamins-Minerals (MULTIVITAMIN ADULT, MINERALS, PO) Take 1 tablet by mouth every morning.   Yes [provider]  naloxone  (NARCAN ) nasal spray 4 mg/0.1 mL Place 1 spray into the nose once.   Yes [provider]  OHTUVAYRE  3 MG/2.5ML SUSP Take 5 mLs by mouth daily. 07/05/24  Yes [provider]  ondansetron  (ZOFRAN -ODT) 4 MG disintegrating tablet Take 4 mg by mouth every 8 (eight) hours as needed for nausea.   Yes [provider]  OXYGEN  Inhale 4 L into the lungs.   Yes [provider]  predniSONE  (DELTASONE ) 20 MG tablet 60mg  daily x 2 days, 40mg  daily x 2 days, 20mg  daily x 2 days then stop. 11/07/24  Yes Trudy Anthony HERO, MD  roflumilast  (DALIRESP ) 500 MCG TABS tablet Take 500 mcg by mouth daily. 08/15/21  Yes [provider]  simvastatin  (ZOCOR ) 40 MG tablet Take 40 mg by mouth at bedtime. 01/17/16  Yes [provider]  Syringe/Needle, Disp, (SYRINGE 3CC/25GX1) 25G X 1 3 ML MISC 1 Syringe by Does not apply route every 30 (thirty) days. 07/12/22  Yes Brahmanday, Govinda R, MD  traZODone  (DESYREL ) 50 MG tablet Take 50 mg by mouth at bedtime as needed for  sleep. 01/22/24 01/21/25 Yes [provider]  bisacodyl  (DULCOLAX) 5 MG EC tablet Take 1 tablet (5 mg total) by mouth daily as needed for moderate constipation. Patient not taking: Reported on 11/03/2024 09/04/23   Dorinda Drue DASEN, MD     Critical care time: 50 minutes     Inge Lecher, AGACNP-BC [facility] Prefer epic messenger for cross cover needs If after hours, please call E-link

## 2024-11-15 NOTE — Progress Notes (Signed)
 1108 MD Awanda and NP Inge Lecher notified of Pt increased WOB. RT, NP and MD assessed. No new orders. MD Awanda and RT to keep pt on continous bipap right now. MD notified PO meds not given  1559 MD Dgayli notified pt was increasingly lethargic and continues to have retractions. MD assessed and no new orders at this time.

## 2024-11-15 NOTE — Care Management Important Message (Signed)
 Important Message  Patient Details  Name: Christian Fields MRN: [id number] Date of Birth: Feb 07, 1956   Important Message Given:        BRANDY CHRISTIANE ORN, CMA 11/15/2024, 3:49 PM

## 2024-11-16 ENCOUNTER — Inpatient Hospital Stay

## 2024-11-16 ENCOUNTER — Inpatient Hospital Stay: Admit: 2024-11-16 | Discharge: 2024-11-16 | Disposition: A | Attending: Pulmonary Disease

## 2024-11-16 DIAGNOSIS — J441 Chronic obstructive pulmonary disease with (acute) exacerbation: Secondary | ICD-10-CM

## 2024-11-16 DIAGNOSIS — J9621 Acute and chronic respiratory failure with hypoxia: Secondary | ICD-10-CM | POA: Diagnosis not present

## 2024-11-16 DIAGNOSIS — N179 Acute kidney failure, unspecified: Secondary | ICD-10-CM

## 2024-11-16 DIAGNOSIS — J122 Parainfluenza virus pneumonia: Principal | ICD-10-CM

## 2024-11-16 DIAGNOSIS — J9622 Acute and chronic respiratory failure with hypercapnia: Secondary | ICD-10-CM | POA: Diagnosis not present

## 2024-11-16 DIAGNOSIS — C3492 Malignant neoplasm of unspecified part of left bronchus or lung: Secondary | ICD-10-CM

## 2024-11-16 LAB — CBC
HCT: 50.5 % (ref 39.0–52.0)
Hemoglobin: 14.8 g/dL (ref 13.0–17.0)
MCH: 28.2 pg (ref 26.0–34.0)
MCHC: 29.3 g/dL — ABNORMAL LOW (ref 30.0–36.0)
MCV: 96.4 fL (ref 80.0–100.0)
Platelets: 221 K/uL (ref 150–400)
RBC: 5.24 MIL/uL (ref 4.22–5.81)
RDW: 16.3 % — ABNORMAL HIGH (ref 11.5–15.5)
WBC: 26.2 K/uL — ABNORMAL HIGH (ref 4.0–10.5)
nRBC: 0 % (ref 0.0–0.2)

## 2024-11-16 LAB — GLUCOSE, CAPILLARY
Glucose-Capillary: 152 mg/dL — ABNORMAL HIGH (ref 70–99)
Glucose-Capillary: 187 mg/dL — ABNORMAL HIGH (ref 70–99)
Glucose-Capillary: 172 mg/dL — ABNORMAL HIGH (ref 70–99)
Glucose-Capillary: 174 mg/dL — ABNORMAL HIGH (ref 70–99)
Glucose-Capillary: 207 mg/dL — ABNORMAL HIGH (ref 70–99)
Glucose-Capillary: 193 mg/dL — ABNORMAL HIGH (ref 70–99)

## 2024-11-16 LAB — BASIC METABOLIC PANEL WITH GFR
Anion gap: 11 (ref 5–15)
BUN: 44 mg/dL — ABNORMAL HIGH (ref 8–23)
CO2: 39 mmol/L — ABNORMAL HIGH (ref 22–32)
Calcium: 9.4 mg/dL (ref 8.9–10.3)
Chloride: 94 mmol/L — ABNORMAL LOW (ref 98–111)
Creatinine, Ser: 1.2 mg/dL (ref 0.61–1.24)
GFR, Estimated: >60 mL/min (ref >60)
Glucose, Bld: 79 mg/dL (ref 70–99)
Potassium: 4.8 mmol/L (ref 3.5–5.1)
Sodium: 145 mmol/L (ref 135–145)

## 2024-11-16 LAB — BLOOD GAS, ARTERIAL
Acid-Base Excess: 17.6 mmol/L — ABNORMAL HIGH (ref 0.0–2.0)
Bicarbonate: 45.5 mmol/L — ABNORMAL HIGH (ref 20.0–28.0)
FIO2: 1 %
MECHVT: 490 mL
O2 Saturation: 99.6 %
PEEP: 5 cmH2O
Patient temperature: 37
RATE: 19 {breaths}/min
pCO2 arterial: 67 mmHg (ref 32–48)
pH, Arterial: 7.44 (ref 7.35–7.45)
pO2, Arterial: 322 mmHg — ABNORMAL HIGH (ref 83–108)

## 2024-11-16 LAB — ECHOCARDIOGRAM COMPLETE
AR max vel: 2.38 cm2
AV Area VTI: 2.35 cm2
AV Area mean vel: 2.29 cm2
AV Mean grad: 5.5 mmHg
AV Peak grad: 10.6 mmHg
Ao pk vel: 1.63 m/s
Calc EF: 60.6 %
Height: 66 in
S' Lateral: 3.4 cm
Single Plane A2C EF: 61.5 %
Single Plane A4C EF: 53.6 %
Weight: 2673.74 [oz_av]

## 2024-11-16 LAB — BLOOD GAS, VENOUS
Acid-Base Excess: 15.5 mmol/L — ABNORMAL HIGH (ref 0.0–2.0)
Bicarbonate: 44 mmol/L — ABNORMAL HIGH (ref 20.0–28.0)
O2 Saturation: 76.8 %
Patient temperature: 37
pCO2, Ven: 71 mmHg (ref 44–60)
pH, Ven: 7.4 (ref 7.25–7.43)
pO2, Ven: 44 mmHg (ref 32–45)

## 2024-11-16 LAB — TROPONIN T, HIGH SENSITIVITY
Troponin T High Sensitivity: 32 ng/L — ABNORMAL HIGH (ref 0–19)
Troponin T High Sensitivity: 32 ng/L — ABNORMAL HIGH (ref 0–19)

## 2024-11-16 LAB — MAGNESIUM: Magnesium: 2.1 mg/dL (ref 1.7–2.4)

## 2024-11-16 LAB — STREP PNEUMONIAE URINARY ANTIGEN: Strep Pneumo Urinary Antigen: NEGATIVE

## 2024-11-16 LAB — PHOSPHORUS: Phosphorus: 3.4 mg/dL (ref 2.5–4.6)

## 2024-11-16 MED ORDER — THIAMINE HCL 100 MG PO TABS
100.0000 mg | ORAL_TABLET | Freq: Every day | ORAL | Status: AC
  Administered 2024-11-17 – 2024-11-23 (×7): 100 mg
  Filled 2024-11-16 (×13): qty 1

## 2024-11-16 MED ORDER — FREE WATER
30.0000 mL | Status: DC
  Administered 2024-11-16 – 2024-11-22 (×35): 30 mL

## 2024-11-16 MED ORDER — EZETIMIBE 10 MG PO TABS
10.0000 mg | ORAL_TABLET | Freq: Every day | ORAL | Status: DC
  Administered 2024-11-16 – 2024-11-25 (×10): 10 mg
  Filled 2024-11-16 (×10): qty 1

## 2024-11-16 MED ORDER — BUDESONIDE 0.25 MG/2ML IN SUSP
0.2500 mg | Freq: Two times a day (BID) | RESPIRATORY_TRACT | Status: DC
  Administered 2024-11-16: 0.25 mg via RESPIRATORY_TRACT
  Filled 2024-11-16: qty 2

## 2024-11-16 MED ORDER — ROCURONIUM BROMIDE 10 MG/ML (PF) SYRINGE
PREFILLED_SYRINGE | INTRAVENOUS | Status: AC
  Administered 2024-11-16: 50 mg via INTRAVENOUS
  Filled 2024-11-16: qty 10

## 2024-11-16 MED ORDER — PIPERACILLIN-TAZOBACTAM 3.375 G IVPB
3.3750 g | Freq: Three times a day (TID) | INTRAVENOUS | Status: DC
  Administered 2024-11-16 – 2024-11-23 (×21): 3.375 g via INTRAVENOUS
  Filled 2024-11-16 (×21): qty 50

## 2024-11-16 MED ORDER — METHYLPREDNISOLONE SODIUM SUCC 40 MG IJ SOLR
40.0000 mg | Freq: Once | INTRAMUSCULAR | Status: AC
  Administered 2024-11-16: 40 mg via INTRAVENOUS

## 2024-11-16 MED ORDER — JUVEN PO PACK
1.0000 | PACK | Freq: Two times a day (BID) | ORAL | Status: DC
  Administered 2024-11-17 – 2024-11-25 (×18): 1

## 2024-11-16 MED ORDER — METHYLPREDNISOLONE SODIUM SUCC 40 MG IJ SOLR
INTRAMUSCULAR | Status: AC
  Filled 2024-11-16: qty 1

## 2024-11-16 MED ORDER — MIDAZOLAM HCL 2 MG/2ML IJ SOLN
INTRAMUSCULAR | Status: AC
  Filled 2024-11-16: qty 2

## 2024-11-16 MED ORDER — CLOPIDOGREL BISULFATE 75 MG PO TABS
75.0000 mg | ORAL_TABLET | Freq: Every day | ORAL | Status: DC
  Administered 2024-11-16 – 2024-11-25 (×10): 75 mg
  Filled 2024-11-16 (×10): qty 1

## 2024-11-16 MED ORDER — VITAL AF 1.2 CAL PO LIQD
1000.0000 mL | ORAL | Status: DC
  Administered 2024-11-16 – 2024-11-23 (×8): 1000 mL

## 2024-11-16 MED ORDER — CHLORHEXIDINE GLUCONATE CLOTH 2 % EX PADS
6.0000 | MEDICATED_PAD | Freq: Every day | CUTANEOUS | Status: DC
  Administered 2024-11-17 – 2024-11-25 (×10): 6 via TOPICAL

## 2024-11-16 MED ORDER — IOHEXOL 350 MG/ML SOLN
75.0000 mL | Freq: Once | INTRAVENOUS | Status: AC | PRN
  Administered 2024-11-16: 75 mL via INTRAVENOUS

## 2024-11-16 MED ORDER — FENTANYL CITRATE (PF) 50 MCG/ML IJ SOSY
50.0000 ug | PREFILLED_SYRINGE | INTRAMUSCULAR | Status: DC | PRN
  Administered 2024-11-16 (×2): 50 ug via INTRAVENOUS
  Filled 2024-11-16 (×2): qty 1

## 2024-11-16 MED ORDER — IPRATROPIUM-ALBUTEROL 0.5-2.5 (3) MG/3ML IN SOLN
3.0000 mL | Freq: Four times a day (QID) | RESPIRATORY_TRACT | Status: DC
  Administered 2024-11-16 – 2024-11-17 (×5): 3 mL via RESPIRATORY_TRACT
  Filled 2024-11-16 (×5): qty 3

## 2024-11-16 MED ORDER — ACETAMINOPHEN 325 MG PO TABS
650.0000 mg | ORAL_TABLET | Freq: Four times a day (QID) | ORAL | Status: DC | PRN
  Administered 2024-11-17 – 2024-11-21 (×2): 650 mg
  Filled 2024-11-16 (×2): qty 2

## 2024-11-16 MED ORDER — DOCUSATE SODIUM 50 MG/5ML PO LIQD
100.0000 mg | Freq: Two times a day (BID) | ORAL | Status: DC
  Administered 2024-11-16 – 2024-11-19 (×8): 100 mg
  Filled 2024-11-16 (×9): qty 10

## 2024-11-16 MED ORDER — POLYETHYLENE GLYCOL 3350 17 G PO PACK
17.0000 g | PACK | Freq: Every day | ORAL | Status: DC
  Administered 2024-11-16 – 2024-11-19 (×4): 17 g
  Filled 2024-11-16 (×4): qty 1

## 2024-11-16 MED ORDER — FENTANYL BOLUS VIA INFUSION
25.0000 ug | INTRAVENOUS | Status: DC | PRN
  Administered 2024-11-16 – 2024-11-17 (×4): 25 ug via INTRAVENOUS
  Administered 2024-11-17: 50 ug via INTRAVENOUS
  Administered 2024-11-18: 75 ug via INTRAVENOUS
  Administered 2024-11-18: 100 ug via INTRAVENOUS
  Administered 2024-11-18: 50 ug via INTRAVENOUS
  Administered 2024-11-18 – 2024-11-19 (×2): 100 ug via INTRAVENOUS

## 2024-11-16 MED ORDER — METHYLPREDNISOLONE SODIUM SUCC 40 MG IJ SOLR: 40.0000 mg | Freq: Every day | INTRAMUSCULAR | Status: DC

## 2024-11-16 MED ORDER — SODIUM CHLORIDE 0.9 % IV SOLN: 250.0000 mL | INTRAVENOUS | Status: AC

## 2024-11-16 MED ORDER — ETOMIDATE 2 MG/ML IV SOLN: 20.0000 mg | Freq: Once | INTRAVENOUS | Status: AC

## 2024-11-16 MED ORDER — FENTANYL 2500MCG IN NS 250ML (10MCG/ML) PREMIX INFUSION
0.0000 ug/h | INTRAVENOUS | Status: DC
  Administered 2024-11-16: 25 ug/h via INTRAVENOUS
  Administered 2024-11-17: 100 ug/h via INTRAVENOUS
  Administered 2024-11-18: 150 ug/h via INTRAVENOUS
  Administered 2024-11-19: 200 ug/h via INTRAVENOUS
  Filled 2024-11-16 (×4): qty 250

## 2024-11-16 MED ORDER — FENTANYL CITRATE (PF) 50 MCG/ML IJ SOSY: 50.0000 ug | PREFILLED_SYRINGE | INTRAMUSCULAR | Status: DC | PRN

## 2024-11-16 MED ORDER — MORPHINE SULFATE (PF) 2 MG/ML IV SOLN
INTRAVENOUS | Status: AC
  Filled 2024-11-16: qty 1

## 2024-11-16 MED ORDER — HYDRALAZINE HCL 20 MG/ML IJ SOLN
10.0000 mg | Freq: Once | INTRAMUSCULAR | Status: AC
  Administered 2024-11-16: 10 mg via INTRAVENOUS

## 2024-11-16 MED ORDER — FENTANYL CITRATE (PF) 100 MCG/2ML IJ SOLN
INTRAMUSCULAR | Status: AC
  Administered 2024-11-16: 100 ug via INTRAVENOUS
  Filled 2024-11-16: qty 2

## 2024-11-16 MED ORDER — SODIUM CHLORIDE 0.9 % IV SOLN: INTRAVENOUS | Status: AC

## 2024-11-16 MED ORDER — SODIUM CHLORIDE 0.9 % IV BOLUS
250.0000 mL | Freq: Once | INTRAVENOUS | Status: AC
  Administered 2024-11-16: 250 mL via INTRAVENOUS

## 2024-11-16 MED ORDER — FUROSEMIDE 10 MG/ML IJ SOLN
INTRAMUSCULAR | Status: AC
  Filled 2024-11-16: qty 4

## 2024-11-16 MED ORDER — ETOMIDATE 2 MG/ML IV SOLN
INTRAVENOUS | Status: AC
  Administered 2024-11-16: 20 mg via INTRAVENOUS
  Filled 2024-11-16: qty 20

## 2024-11-16 MED ORDER — INSULIN ASPART 100 UNIT/ML IJ SOLN
0.0000 [IU] | INTRAMUSCULAR | Status: DC
  Administered 2024-11-16 (×3): 3 [IU] via SUBCUTANEOUS
  Administered 2024-11-16: 5 [IU] via SUBCUTANEOUS
  Administered 2024-11-16: 3 [IU] via SUBCUTANEOUS
  Administered 2024-11-17: 5 [IU] via SUBCUTANEOUS
  Administered 2024-11-17 (×2): 3 [IU] via SUBCUTANEOUS
  Administered 2024-11-17 (×3): 5 [IU] via SUBCUTANEOUS
  Administered 2024-11-18 (×3): 3 [IU] via SUBCUTANEOUS
  Administered 2024-11-18: 5 [IU] via SUBCUTANEOUS
  Administered 2024-11-18 (×2): 3 [IU] via SUBCUTANEOUS
  Administered 2024-11-19: 5 [IU] via SUBCUTANEOUS
  Administered 2024-11-19: 2 [IU] via SUBCUTANEOUS
  Administered 2024-11-19: 5 [IU] via SUBCUTANEOUS
  Administered 2024-11-19: 3 [IU] via SUBCUTANEOUS
  Administered 2024-11-19: 2 [IU] via SUBCUTANEOUS
  Administered 2024-11-20: 3 [IU] via SUBCUTANEOUS
  Administered 2024-11-20: 2 [IU] via SUBCUTANEOUS
  Administered 2024-11-20: 5 [IU] via SUBCUTANEOUS
  Administered 2024-11-20: 3 [IU] via SUBCUTANEOUS
  Administered 2024-11-21: 5 [IU] via SUBCUTANEOUS
  Administered 2024-11-21: 2 [IU] via SUBCUTANEOUS
  Administered 2024-11-21: 3 [IU] via SUBCUTANEOUS
  Administered 2024-11-21: 5 [IU] via SUBCUTANEOUS
  Administered 2024-11-21 (×3): 2 [IU] via SUBCUTANEOUS
  Administered 2024-11-22: 12:00:00 8 [IU] via SUBCUTANEOUS
  Administered 2024-11-22: 04:00:00 3 [IU] via SUBCUTANEOUS
  Administered 2024-11-22: 2 [IU] via SUBCUTANEOUS
  Administered 2024-11-22: 16:00:00 5 [IU] via SUBCUTANEOUS
  Administered 2024-11-22: 20:00:00 2 [IU] via SUBCUTANEOUS
  Administered 2024-11-22: 08:00:00 5 [IU] via SUBCUTANEOUS
  Administered 2024-11-23: 05:00:00 3 [IU] via SUBCUTANEOUS
  Administered 2024-11-23: 09:00:00 5 [IU] via SUBCUTANEOUS
  Administered 2024-11-23: 12:00:00 8 [IU] via SUBCUTANEOUS
  Filled 2024-11-16: qty 5
  Filled 2024-11-16 (×3): qty 3
  Filled 2024-11-16: qty 5
  Filled 2024-11-16 (×2): qty 2
  Filled 2024-11-16 (×3): qty 5
  Filled 2024-11-16: qty 2
  Filled 2024-11-16: qty 3
  Filled 2024-11-16: qty 5
  Filled 2024-11-16 (×2): qty 2
  Filled 2024-11-16 (×7): qty 3
  Filled 2024-11-16 (×2): qty 2
  Filled 2024-11-16: qty 8
  Filled 2024-11-16 (×2): qty 3
  Filled 2024-11-16: qty 2
  Filled 2024-11-16 (×2): qty 3
  Filled 2024-11-16 (×3): qty 5
  Filled 2024-11-16: qty 3
  Filled 2024-11-16: qty 5
  Filled 2024-11-16: qty 8
  Filled 2024-11-16: qty 2
  Filled 2024-11-16 (×4): qty 5
  Filled 2024-11-16: qty 3

## 2024-11-16 MED ORDER — MORPHINE SULFATE (PF) 2 MG/ML IV SOLN
INTRAVENOUS | Status: AC
  Administered 2024-11-16: 2 mg via INTRAVENOUS
  Filled 2024-11-16: qty 1

## 2024-11-16 MED ORDER — NOREPINEPHRINE 4 MG/250ML-% IV SOLN
0.0000 ug/min | INTRAVENOUS | Status: DC
  Administered 2024-11-16: 5 ug/min via INTRAVENOUS
  Administered 2024-11-16: 9 ug/min via INTRAVENOUS
  Administered 2024-11-16: 10 ug/min via INTRAVENOUS
  Administered 2024-11-18: 1 ug/min via INTRAVENOUS
  Filled 2024-11-16 (×4): qty 250

## 2024-11-16 MED ORDER — MIDAZOLAM HCL (PF) 2 MG/2ML IJ SOLN: 4.0000 mg | Freq: Once | INTRAMUSCULAR | Status: AC

## 2024-11-16 MED ORDER — FAMOTIDINE 20 MG PO TABS
20.0000 mg | ORAL_TABLET | Freq: Two times a day (BID) | ORAL | Status: DC
  Administered 2024-11-16: 20 mg
  Filled 2024-11-16: qty 1

## 2024-11-16 MED ORDER — FUROSEMIDE 10 MG/ML IJ SOLN
40.0000 mg | Freq: Two times a day (BID) | INTRAMUSCULAR | Status: DC
  Administered 2024-11-16: 40 mg via INTRAVENOUS

## 2024-11-16 MED ORDER — FENTANYL CITRATE (PF) 100 MCG/2ML IJ SOLN: 100.0000 ug | Freq: Once | INTRAMUSCULAR | Status: AC

## 2024-11-16 MED ORDER — PROPOFOL 1000 MG/100ML IV EMUL
INTRAVENOUS | Status: AC
  Filled 2024-11-16: qty 100

## 2024-11-16 MED ORDER — ORAL CARE MOUTH RINSE
15.0000 mL | OROMUCOSAL | Status: DC
  Administered 2024-11-16 – 2024-11-25 (×116): 15 mL via OROMUCOSAL

## 2024-11-16 MED ORDER — MIDAZOLAM HCL (PF) 2 MG/2ML IJ SOLN
1.0000 mg | INTRAMUSCULAR | Status: DC | PRN
  Administered 2024-11-16: 1 mg via INTRAVENOUS
  Filled 2024-11-16 (×2): qty 2

## 2024-11-16 MED ORDER — MORPHINE SULFATE (PF) 2 MG/ML IV SOLN
2.0000 mg | Freq: Once | INTRAVENOUS | Status: AC
  Administered 2024-11-16: 2 mg via INTRAVENOUS

## 2024-11-16 MED ORDER — METHYLPREDNISOLONE SODIUM SUCC 40 MG IJ SOLR
40.0000 mg | Freq: Two times a day (BID) | INTRAMUSCULAR | Status: DC
  Administered 2024-11-16 – 2024-11-19 (×6): 40 mg via INTRAVENOUS
  Filled 2024-11-16 (×6): qty 1

## 2024-11-16 MED ORDER — ROCURONIUM BROMIDE 10 MG/ML (PF) SYRINGE: 50.0000 mg | PREFILLED_SYRINGE | Freq: Once | INTRAVENOUS | Status: AC

## 2024-11-16 MED ORDER — PROPOFOL 1000 MG/100ML IV EMUL
0.0000 ug/kg/min | INTRAVENOUS | Status: DC
  Administered 2024-11-16: 10 ug/kg/min via INTRAVENOUS
  Administered 2024-11-16: 30 ug/kg/min via INTRAVENOUS
  Administered 2024-11-17: 20 ug/kg/min via INTRAVENOUS
  Administered 2024-11-17: 40 ug/kg/min via INTRAVENOUS
  Administered 2024-11-17 – 2024-11-18 (×6): 50 ug/kg/min via INTRAVENOUS
  Administered 2024-11-18: 45 ug/kg/min via INTRAVENOUS
  Administered 2024-11-18 – 2024-11-19 (×3): 50 ug/kg/min via INTRAVENOUS
  Administered 2024-11-19 (×2): 70 ug/kg/min via INTRAVENOUS
  Administered 2024-11-19: 50 ug/kg/min via INTRAVENOUS
  Administered 2024-11-19 – 2024-11-20 (×5): 80 ug/kg/min via INTRAVENOUS
  Administered 2024-11-20: 50 ug/kg/min via INTRAVENOUS
  Administered 2024-11-20: 80 ug/kg/min via INTRAVENOUS
  Administered 2024-11-20 (×2): 50 ug/kg/min via INTRAVENOUS
  Administered 2024-11-21 (×3): 60 ug/kg/min via INTRAVENOUS
  Administered 2024-11-21: 80 ug/kg/min via INTRAVENOUS
  Administered 2024-11-21 (×2): 30 ug/kg/min via INTRAVENOUS
  Administered 2024-11-22 (×2): 50 ug/kg/min via INTRAVENOUS
  Administered 2024-11-22 (×2): 60 ug/kg/min via INTRAVENOUS
  Administered 2024-11-22: 18:00:00 50 ug/kg/min via INTRAVENOUS
  Administered 2024-11-22: 02:00:00 60 ug/kg/min via INTRAVENOUS
  Administered 2024-11-23: 09:00:00 70 ug/kg/min via INTRAVENOUS
  Administered 2024-11-23 (×3): 80 ug/kg/min via INTRAVENOUS
  Administered 2024-11-23: 06:00:00 70 ug/kg/min via INTRAVENOUS
  Administered 2024-11-23: 02:00:00 60 ug/kg/min via INTRAVENOUS
  Administered 2024-11-23 (×2): 80 ug/kg/min via INTRAVENOUS
  Administered 2024-11-24: 03:00:00 60 ug/kg/min via INTRAVENOUS
  Administered 2024-11-24 (×3): 50 ug/kg/min via INTRAVENOUS
  Administered 2024-11-24: 20 ug/kg/min via INTRAVENOUS
  Administered 2024-11-24: 18:00:00 60 ug/kg/min via INTRAVENOUS
  Administered 2024-11-25: 11:00:00 20 ug/kg/min via INTRAVENOUS
  Administered 2024-11-25: 18:00:00 50 ug/kg/min via INTRAVENOUS
  Filled 2024-11-16 (×53): qty 100

## 2024-11-16 MED ORDER — MIDAZOLAM HCL 2 MG/2ML IJ SOLN
INTRAMUSCULAR | Status: AC
  Administered 2024-11-16: 4 mg via INTRAVENOUS
  Filled 2024-11-16: qty 2

## 2024-11-16 MED ORDER — HYDRALAZINE HCL 20 MG/ML IJ SOLN
INTRAMUSCULAR | Status: AC
  Filled 2024-11-16: qty 1

## 2024-11-16 MED ORDER — MORPHINE SULFATE (PF) 2 MG/ML IV SOLN: 2.0000 mg | Freq: Once | INTRAVENOUS | Status: AC

## 2024-11-16 MED ORDER — ORAL CARE MOUTH RINSE: 15.0000 mL | OROMUCOSAL | Status: DC | PRN

## 2024-11-16 MED ORDER — PANTOPRAZOLE SODIUM 40 MG IV SOLR
40.0000 mg | Freq: Every day | INTRAVENOUS | Status: DC
  Administered 2024-11-16 – 2024-11-24 (×9): 40 mg via INTRAVENOUS
  Filled 2024-11-16 (×11): qty 10

## 2024-11-16 MED ORDER — ACETAMINOPHEN 650 MG RE SUPP: 650.0000 mg | Freq: Four times a day (QID) | RECTAL | Status: DC | PRN

## 2024-11-16 NOTE — Progress Notes (Signed)
 Acute Hypoxic / Hypercapnic Respiratory Failure secondary to AECOPD in the setting of possible aspiration Attempted to support the patient with solu medrol , lasix , morphine  without effect. Requiring intubation and mechanical vent support. - Ventilator settings: PRVC  8 mL/kg, 100% FiO2, 5 PEEP, continue ventilator support & lung protective strategies - Wean PEEP & FiO2 as tolerated, maintain SpO2 > 90% - Head of bed elevated 30 degrees, VAP protocol in place - Plateau pressures less than 30 cm H20  - Intermittent chest x-ray & ABG PRN - Daily WUA with SBT as tolerated  - Ensure adequate pulmonary hygiene  - F/u cultures, trend PCT - Continue ceftriaxone  & azithromycin  - Steroids initiated: solu-medrol  40 mg BID / - Budesonide  nebs BID, duo nebs Q 6, bronchodilators PRN - PAD protocol in place: continue Fentanyl  IVP & Versed  IVP - initiate levophed , wean as tolerated to maintain MAP > 65  Daughter updated on clinical status change. All questions answered at this time.  Jenita Ruth Rust-Chester, AGACNP-BC Acute Care Nurse Practitioner [facility]   [phone] / [phone] Please see Amion for details.

## 2024-11-16 NOTE — Plan of Care (Signed)
  Problem: Clinical Measurements: Goal: Cardiovascular complication will be avoided Outcome: Progressing   Problem: Nutrition: Goal: Adequate nutrition will be maintained Outcome: Progressing   Problem: Elimination: Goal: Will not experience complications related to bowel motility Outcome: Progressing Goal: Will not experience complications related to urinary retention Outcome: Progressing   Problem: Pain Managment: Goal: General experience of comfort will improve and/or be controlled Outcome: Progressing   Problem: Education: Goal: Knowledge of General Education information will improve Description: Including pain rating scale, medication(s)/side effects and non-pharmacologic comfort measures Outcome: Not Progressing   Problem: Health Behavior/Discharge Planning: Goal: Ability to manage health-related needs will improve Outcome: Not Progressing

## 2024-11-16 NOTE — Progress Notes (Signed)
 Initial Nutrition Assessment  DOCUMENTATION CODES:   Not applicable  INTERVENTION:   Vital 1.2@60ml /hr- Initiate at 67ml/hr and increase by 10ml/hr q 8 hours until goal rate is reached.   Free water  flushes 30ml q4 hours to maintain tube patency   Regimen provides 1728kcal/day, 108g/day protein and 1310ml/day of free water .   Pt at high refeed risk; recommend monitor potassium, magnesium  and phosphorus labs daily until stable  Thiamine  100mg  daily via tube x 7 days   Daily weights   Juven Fruit Punch BID via tube, each serving provides 95kcal and 2.5g of protein (amino acids glutamine and arginine)  NUTRITION DIAGNOSIS:   Inadequate oral intake related to inability to eat (pt sedated and ventilated) as evidenced by NPO status.  GOAL:   Provide needs based on ASPEN/SCCM guidelines  MONITOR:   Vent status, Labs, Weight trends, TF tolerance, I & O's, Skin  REASON FOR ASSESSMENT:   Consult Enteral/tube feeding initiation and management  ASSESSMENT:   68 y/o male with h/o stage IV COPD, NSCLC left upper lobe s/p XRT, anemia, IDDM, HTN, OSA, DDD, chronic back pain, CHF, HLD, AAA, MDD, anxiety, OSA, TIA, gout, PVD, substance abuse and recent admission for respiratory failure with hypoxia who is admitted with parainfluenza, PNA and COPD exacerbation.  Pt sedated and ventilated. OGT in place. Pt is known to this RD from a recent previous admission. Will plan to initiate tube feeds today. Family at bedside reports pt with good appetite and oral intake pta. Per chart, pt is down 7lbs(4%) over the past 9 months; this is not significant.   Medications reviewed and include: plavix , colace, lovenox , insulin , solu-medrol , protonix , miralax , azithromycin , levophed , zosyn , propofol    Labs reviewed: K 4.8 wnl, BUN 44(H), P 3.4 wnl, Mg 2.1 wnl Pro BNP 2454.0(H)- 12/7 Wbc- 26.2(H) Cbgs- 187, 152 x 24 hrs  AIC 6.0(H)- 8/8  Patient is currently intubated on ventilator support MV: 7.6  L/min Temp (24hrs), Avg:98.8 F (37.1 C), Min:97.9 F (36.6 C), Max:100 F (37.8 C)  Propofol : 13.64 ml/hr- provides 360kcal/day   MAP >52mmHg   UOP-   NUTRITION - FOCUSED PHYSICAL EXAM:  Flowsheet Row Most Recent Value  Orbital Region No depletion  Upper Arm Region Moderate depletion  Thoracic and Lumbar Region No depletion  Buccal Region No depletion  Temple Region Mild depletion  Clavicle Bone Region No depletion  Clavicle and Acromion Bone Region No depletion  Scapular Bone Region No depletion  Dorsal Hand No depletion  Patellar Region Moderate depletion  Anterior Thigh Region Moderate depletion  Posterior Calf Region Moderate depletion  Edema (RD Assessment) Mild  Hair Reviewed  Eyes Reviewed  Mouth Reviewed  Skin Reviewed  Nails Reviewed   Diet Order:   Diet Order             Diet NPO time specified  Diet effective now                  EDUCATION NEEDS:   No education needs have been identified at this time  Skin:  Skin Assessment: Reviewed RN Assessment (L & R diabetic foot ulcer)  Last BM:  12/6  Height:   Ht Readings from Last 1 Encounters:  11/16/24 5' 6 (1.676 m)    Weight:   Wt Readings from Last 1 Encounters:  11/14/24 75.8 kg    Ideal Body Weight:  64.5 kg  BMI:  Body mass index is 26.97 kg/m.  Estimated Nutritional Needs:   Kcal:  1781kcal/day  Protein:  100-115g/day  Fluid:  1.7-1.9L/day  Augustin Shams MS, RD, LDN If unable to be reached, please send secure chat to RD inpatient available from 8:00a-4:00p daily

## 2024-11-16 NOTE — Progress Notes (Signed)
 NAME:  Christian Fields, MRN:  [id number], DOB:  December 20, 1955, LOS: 4 ADMISSION DATE:  11/12/2024, CONSULTATION DATE:  11/15/2024 REFERRING MD:  Dr. Awanda, CHIEF COMPLAINT:  Acute Respiratory Distress   Brief Pt Description / Synopsis:  68 y.o. male with PMHx significant for COPD and lung cancer admitted with Acute on Chronic Hypoxic and Hypercapnic Respiratory Failure due to Acute COPD Exacerbation due to Parainfluenza Infection requiring BiPAP.  Failed trial of BiPAP on 11/16/24 requiring intubation and mechanical ventilation.   History of Present Illness:  Christian Fields is a 68 year old male with a history of lifelong smoking and advanced COPD, had a diagnosis of stage I left upper lobe lung cancer after noticing an interval increase in the size of nodule from 1 cm to 1.4 in 2021. He went straight to SBRT due to being a poor surgical candidate for biopsy. He also is having recurrent COPD exacerbations and having rehospitalization for this. He has chronic hypoxemia and uses oxygen  at home up to 5 L/min. He has chronic physical deconditioning. He has chronic peripheral edema and also follows up with cardiology for essential hypertension and dyslipidemia. He also has a background history of CKD 3 A, sleep apnea noncompliant with CPAP therapy, polysubstance abuse and abdominal aortic aneurysm. He came to the hospital on this admission with complaints of worsening shortness of breath x 1 day was noted to have oxygen  saturation approximately 60% on his usual 4 L/min nasal cannula he denies sick contacts or fevers at home. He had a venous blood gas performed in the ED which showed mild acidemia with hypercapnia. He was treated with nebulizer therapy and steroids as well as BiPAP and reports slight improvement since then. He is started on empiric antibiotics with cefepime  and vancomycin  for possible pneumonia. PCCM consultation placed due to recurrent hospitalization with advanced COPD, chronic hypoxemia and lung cancer.     Please see Significant Hospital Events section below for full detailed hospital course.   Pertinent  Medical History   Past Medical History:  Diagnosis Date   Anemia    Anxiety    Arthritis    Asthma    Cancer (HCC)    Basal Cell Skin Cancer   Chronic back pain    COPD (chronic obstructive pulmonary disease) (HCC)    Depression    Diabetes mellitus (HCC)    Dyspnea    GERD (gastroesophageal reflux disease)    Gout    Gout    Headache    History of blood clots    Left Leg--July 2018   History of kidney stones    Hyperlipidemia    Hyperlipidemia    Hypertension    Kidney stones    Neuropathy    On home oxygen  therapy    2 L / M   Pneumonia 06/2017   Sleep apnea    Ulcer of foot (HCC)    Right    Micro Data:  12/5: COVID/FLU/RSV PCR>> negative 12/5: RVP >> + Parainfluenza  12/5: MRSA PCR>> negative  12/9: Tracheal aspirate>> 12/9: Strep pneumo urinary antigen>> 12/9: Legionella urinary antigen>>  Antimicrobials:   Anti-infectives (From admission, onward)    Start     Dose/Rate Route Frequency Ordered Stop   11/15/24 1215  azithromycin  (ZITHROMAX ) 500 mg in sodium chloride  0.9 % 250 mL IVPB        500 mg 250 mL/hr over 60 Minutes Intravenous Every 24 hours 11/15/24 1129     11/15/24 1215  cefTRIAXone  (ROCEPHIN ) 2 g in  sodium chloride  0.9 % 100 mL IVPB        2 g 200 mL/hr over 30 Minutes Intravenous Every 24 hours 11/15/24 1129     11/13/24 1900  vancomycin  (VANCOREADY) IVPB 1250 mg/250 mL  Status:  Discontinued        1,250 mg 166.7 mL/hr over 90 Minutes Intravenous Every 24 hours 11/12/24 1744 11/12/24 1749   11/13/24 1000  azithromycin  (ZITHROMAX ) tablet 250 mg  Status:  Discontinued        250 mg Oral Daily 11/13/24 0819 11/15/24 1129   11/13/24 0500  piperacillin -tazobactam (ZOSYN ) IVPB 3.375 g  Status:  Discontinued        3.375 g 12.5 mL/hr over 240 Minutes Intravenous Every 8 hours 11/12/24 2112 11/13/24 0814   11/12/24 1900  vancomycin   (VANCOREADY) IVPB 1750 mg/350 mL  Status:  Discontinued        1,750 mg 175 mL/hr over 120 Minutes Intravenous  Once 11/12/24 1744 11/12/24 1749   11/12/24 1700  ceFEPIme  (MAXIPIME ) 2 g in sodium chloride  0.9 % 100 mL IVPB  Status:  Discontinued        2 g 200 mL/hr over 30 Minutes Intravenous Every 12 hours 11/12/24 1615 11/12/24 1745       Significant Hospital Events: Including procedures, antibiotic start and stop dates in addition to other pertinent events   12/5: Admitted by [facility].  Pulmonary consulted for severe AECOPD requiring BiPAP. 11/14/24-patient with Parainfluenza induced AECOPD. He was moved to Step down unit due to progressive dyspnea and is on BIPAP. He is on solumedrol BID and nebulizer therapy. Will continue to follow. Anticipate slow improvement over next 3-5 weeks with this virus. Hopefully dc in 3-4days.  11/15/24: Mid morning pt with increased WOB and accessory muscle use.  PCCM consulted.  Given 40 mg Lasix  by [name].  Increase duonebs to q4h and start Ceftriaxone  and increase Azithromycin  dose to 500 mg.  High risk for intubation.  11/16/24: Acutely decompensated, became severely hypoxic (sats 60's) with increased WOB, required intubation and mechanical ventilation. Given acute decompensation and persistent sinus tachycardia, CTa obtained which is negative for PE, does show possible superimposed bacterial pneumonia.  Ceftriaxone  broadened to Zosyn . Telemetry alarming for ST elevation, obtained EKG and reviewed with Dr. Mady of Cardiology, felt not to be STEMI, obtaining Troponin and Echo.  Interim History / Subjective:  As outlined above under Significant Hospital Events section  Objective   Blood pressure (!) 254/223, pulse (!) 112, temperature 97.9 F (36.6 C), temperature source Axillary, resp. rate 19, height 5' 6 (1.676 m), weight 75.8 kg, SpO2 100%.    Vent Mode: PRVC FiO2 (%):  [40 %-100 %] 100 % Set Rate:  [19 bmp] 19 bmp Vt Set:  [490 mL] 490 mL PEEP:  [5 cmH20]  5 cmH20 Pressure Support:  [5 cmH20] 5 cmH20 Plateau Pressure:  [20 cmH20] 20 cmH20   Intake/Output Summary (Last 24 hours) at 11/16/2024 0730 Last data filed at 11/16/2024 0400 Gross per 24 hour  Intake 350 ml  Output 1325 ml  Net -975 ml   Filed Weights   11/12/24 1353 11/14/24 1818  Weight: 77 kg 75.8 kg    Examination: General: Acute on chronically ill appearing male, sitting in bed, intubated and sedated, in NAD HENT: Atraumatic, normocephalic, neck supple, no JVD Lungs: Coarse breath sounds throughout with expiratory wheezing, even, synchrounous with vent  Cardiovascular: Tachycardia, Regular rhythm, s1s2, no M/R/G Abdomen: Soft, nontender, nondistended, no guarding or rebound tenderness, BS+ x4  Extremities: Normal bulk and tone, no deformities, warm and well perfused, trace edema BLE Neuro: Sedated, withdraws from pain, PERRL GU: Foley catheter in place draining yellow urine   Resolved Hospital Problem list     Assessment & Plan:   #Acute on chronic Hypoxic & Hypercapnic Respiratory Failure #Acute COPD Exacerbation due to Parainfluenza Viral Infection #Questionable superimposed bacterial pneumonia  #Lung Cancer CTa Chest PE 12/9: negative for PE, new multifocal airspace consolidation in RLL, RML, and LLL, stable 2.6 cm pleural based mass like consolidation in lateral LUL, enlarged central pulmonary arteries suggesting pulmonary artery hypertension  -Full vent support, implement lung protective strategies -Plateau pressures less than 30 cm H20 -Wean FiO2 & PEEP as tolerated to maintain O2 sats 88 to 92% -Follow intermittent Chest X-ray & ABG as needed -Spontaneous Breathing Trials when respiratory parameters met and mental status permits -Implement VAP Bundle -Bronchodilators & Pulmicort  nebs  -IV Steroids (Solumedrol 40 mg BID) -ABX as above ~ continue Azithromycin , broaden Ceftriaxone  to Zosyn     -Diuresis as BP and renal function permits ~ given 40 mg Lasix  12/8 by  [facility], will hold today 12/9 -Pulmonary toilet as able  #Tachycardia, likely compensatory in nature for respiratory failure #Mildly Elevated Troponin, suspect demand ischemia  12/9: Telemetry alarming ST elevation, EKG with ST elevation in inferior leads (but no reciprocal changes), reviewed with Dr. Mady, felt not to be a STEMI, awaiting Troponin and Echo results with low threshold for Cardiology consult -Continuous cardiac monitoring -Maintain MAP >65 -Gentle IV fluids -Vasopressors as needed to maintain MAP goal ~ not requiring  -Trend lactic acid until normalized -Trend HS Troponin until peaked -Echocardiogram pending  #Parainfluenza Viral Infection #Concern for superimposed bacterial pneumonia  -Monitor fever curve -Trend WBC's & Procalcitonin -Follow cultures as above -Continue empiric Azithromycin , broaden to Zosyn  pending cultures & sensitivities   #Sedation needs in setting of mechanical ventilation -Maintain a RASS goal of 0 to -1 -Fentanyl  & Propofol  to maintain RASS goal -Avoid sedating medications as able -Daily wake up assessment      Best Practice (right click and Reselect all SmartList Selections daily)   Diet/type: NPO, start tube feeds  DVT prophylaxis: LMWH GI prophylaxis: PPI Lines: N/A Foley:  yes, and still needed  Code Status:  full code Last date of multidisciplinary goals of care discussion [12/9]  12/9: Will update pt's family when they arrive at bedside.  Pt's daughter was updated via telephone by Jenita Ruth regarding acute decompensation and need for intubation.   Labs   CBC: Recent Labs  Lab 11/12/24 1408 11/13/24 0017 11/14/24 0459 11/15/24 0636 11/16/24 0310  WBC 25.5* 12.7* 24.0* 31.7* 26.2*  HGB 13.5 12.0* 13.1 13.7 14.8  HCT 45.0 40.4 44.8 45.6 50.5  MCV 95.9 96.4 98.5 95.4 96.4  PLT 288 256 274 254 221    Basic Metabolic Panel: Recent Labs  Lab 11/12/24 1434 11/13/24 0017 11/14/24 0459 11/15/24 0636 11/16/24 0310  NA  139 138 137 143 145  K 4.5 4.8 4.7 4.7 4.8  CL 97* 98 99 97* 94*  CO2 33* 27 26 37* 39*  GLUCOSE 191* 341* 219* 171* 79  BUN 38* 42* 34* 40* 44*  CREATININE 1.25* 1.47* 1.18 1.17 1.20  CALCIUM  9.2 8.5* 8.6* 9.5 9.4  MG  --   --  2.4 2.0 2.1  PHOS  --   --   --  3.1 3.4   GFR: Estimated Creatinine Clearance: 52.4 mL/min (by C-G formula based on SCr of 1.2  mg/dL). Recent Labs  Lab 11/13/24 0017 11/13/24 1106 11/14/24 0459 11/14/24 1704 11/15/24 0636 11/15/24 1237 11/16/24 0310  PROCALCITON  --  0.23  --  0.18  --  0.20  --   WBC 12.7*  --  24.0*  --  31.7*  --  26.2*    Liver Function Tests: Recent Labs  Lab 11/12/24 1434 11/13/24 0017  AST 19 18  ALT 26 21  ALKPHOS 113 102  BILITOT 0.3 <0.2  PROT 7.1 6.3*  ALBUMIN  4.2 3.7   No results for input(s): LIPASE, AMYLASE in the last 168 hours. No results for input(s): AMMONIA in the last 168 hours.  ABG    Component Value Date/Time   PHART 7.47 (H) 03/08/2021 1048   PCO2ART 39 03/08/2021 1048   PO2ART 59 (L) 03/08/2021 1048   HCO3 44.4 (H) 11/15/2024 1114   ACIDBASEDEF 1.4 11/03/2024 1854   O2SAT 99 11/15/2024 1114     Coagulation Profile: Recent Labs  Lab 11/13/24 0017  INR 1.0    Cardiac Enzymes: No results for input(s): CKTOTAL, CKMB, CKMBINDEX, TROPONINI in the last 168 hours.  HbA1C: Hemoglobin A1C  Date/Time Value Ref Range Status  09/25/2013 03:56 AM 6.3 4.2 - 6.3 % Final    Comment:    The American Diabetes Association recommends that a primary goal of therapy should be <7% and that physicians should reevaluate the treatment regimen in patients with HbA1c values consistently >8%.    Hgb A1c MFr Bld  Date/Time Value Ref Range Status  07/16/2024 03:19 AM 6.0 (H) 4.8 - 5.6 % Final    Comment:    (NOTE)         Prediabetes: 5.7 - 6.4         Diabetes: >6.4         Glycemic control for adults with diabetes: <7.0   10/17/2023 04:51 AM 5.7 (H) 4.8 - 5.6 % Final    Comment:     (NOTE) Pre diabetes:          5.7%-6.4%  Diabetes:              >6.4%  Glycemic control for   <7.0% adults with diabetes     CBG: Recent Labs  Lab 11/14/24 2227 11/15/24 0722 11/15/24 1110 11/15/24 1623 11/15/24 2113  GLUCAP 192* 173* 96 122* 88    Review of Systems:   Unable to assess due to intubation/sedation/critical illness   Past Medical History:  He,  has a past medical history of Anemia, Anxiety, Arthritis, Asthma, Cancer (HCC), Chronic back pain, COPD (chronic obstructive pulmonary disease) (HCC), Depression, Diabetes mellitus (HCC), Dyspnea, GERD (gastroesophageal reflux disease), Gout, Gout, Headache, History of blood clots, History of kidney stones, Hyperlipidemia, Hyperlipidemia, Hypertension, Kidney stones, Neuropathy, On home oxygen  therapy, Pneumonia (06/2017), Sleep apnea, and Ulcer of foot (HCC).   Surgical History:   Past Surgical History:  Procedure Laterality Date   APPENDECTOMY     DG FEET 2 VIEWS BILAT     IRRIGATION AND DEBRIDEMENT FOOT Left 07/11/2023   Procedure: IRRIGATION AND DEBRIDEMENT FOOT;  Surgeon: Ashley Soulier, DPM;  Location: [facility] ORS;  Service: Orthopedics/Podiatry;  Laterality: Left;   LIPOMA EXCISION Right 08/15/2017   Procedure: EXCISION TUMOR(CYST) FOOT;  Surgeon: Lilli Cough, DPM;  Location: [facility] ORS;  Service: Podiatry;  Laterality: Right;   LOWER EXTREMITY ANGIOGRAPHY Left 07/03/2023   Procedure: Lower Extremity Angiography;  Surgeon: Marea Selinda RAMAN, MD;  Location: [facility] INVASIVE CV LAB;  Service: Cardiovascular;  Laterality: Left;  OTHER SURGICAL HISTORY Bilateral Foot surgery     Social History:   reports that he has quit smoking. His smoking use included cigarettes. He started smoking about 7 years ago. He has a 4 pack-year smoking history. He has never used smokeless tobacco. He reports current alcohol  use of about 1.0 standard drink of alcohol  per week. He reports that he does not use drugs.   Family History:  His family  history includes Hypertension in his mother.   Allergies Allergies  Allergen Reactions   Bee Venom Anaphylaxis   Linezolid  Other (See Comments)    Thrombocytopenia, hyperlactatemia   Azithromycin  Itching and Rash     Home Medications  Prior to Admission medications   Medication Sig Start Date End Date Taking? Authorizing Provider  acetaminophen  (TYLENOL ) 325 MG tablet Take 650 mg by mouth every 6 (six) hours as needed.   Yes [provider]  albuterol  (VENTOLIN  HFA) 108 (90 Base) MCG/ACT inhaler Inhale 2 puffs into the lungs every 4 (four) hours as needed for wheezing or shortness of breath.   Yes [provider]  allopurinol  (ZYLOPRIM ) 300 MG tablet Take 600 mg by mouth daily. 02/24/20  Yes [provider]  aspirin  EC 81 MG tablet Take 1 tablet by mouth daily.   Yes [provider]  azelastine  (ASTELIN ) 0.1 % nasal spray Place 2 sprays into both nostrils 2 (two) times daily as needed. 05/27/23  Yes [provider]  Buprenorphine  HCl-Naloxone  HCl 8-2 MG FILM Place 1 Film under the tongue in the morning, at noon, and at bedtime.   Yes [provider]  clopidogrel  (PLAVIX ) 75 MG tablet Take 1 tablet (75 mg total) by mouth daily. 02/02/21  Yes Leotis Bogus, MD  dapagliflozin  propanediol (FARXIGA ) 10 MG TABS tablet Take 1 tablet by mouth daily. 03/16/24 03/16/25 Yes [provider]  DULoxetine  (CYMBALTA ) 60 MG capsule Take 60 mg by mouth daily.   Yes [provider]  ezetimibe  (ZETIA ) 10 MG tablet Take 10 mg by mouth daily. 02/24/20  Yes [provider]  fluticasone  (FLONASE ) 50 MCG/ACT nasal spray Place 1 spray into both nostrils daily as needed for allergies. 10/16/18  Yes [provider]  Fluticasone -Umeclidin-Vilant (TRELEGY ELLIPTA) 100-62.5-25 MCG/ACT AEPB Inhale 1 puff into the lungs in the morning. 09/12/21  Yes [provider]  furosemide  (LASIX ) 20 MG tablet Take 1 tablet (20 mg total) by mouth  daily. 02/23/24  Yes Jens Durand, MD  gabapentin  (NEURONTIN ) 800 MG tablet Take 800 mg by mouth 4 (four) times daily. 10/22/23  Yes [provider]  ipratropium-albuterol  (DUONEB) 0.5-2.5 (3) MG/3ML SOLN Take 3 mLs by nebulization every 6 (six) hours as needed (wheezing).   Yes [provider]  metFORMIN  (GLUCOPHAGE ) 1000 MG tablet Take 1,000 mg by mouth daily.   Yes [provider]  metoprolol  succinate (TOPROL -XL) 50 MG 24 hr tablet Take 50 mg by mouth daily. 02/24/20  Yes [provider]  Multiple Vitamins-Minerals (MULTIVITAMIN ADULT, MINERALS, PO) Take 1 tablet by mouth every morning.   Yes [provider]  naloxone  (NARCAN ) nasal spray 4 mg/0.1 mL Place 1 spray into the nose once.   Yes [provider]  OHTUVAYRE  3 MG/2.5ML SUSP Take 5 mLs by mouth daily. 07/05/24  Yes [provider]  ondansetron  (ZOFRAN -ODT) 4 MG disintegrating tablet Take 4 mg by mouth every 8 (eight) hours as needed for nausea.   Yes [provider]  OXYGEN  Inhale 4 L into the lungs.  Yes [provider]  predniSONE  (DELTASONE ) 20 MG tablet 60mg  daily x 2 days, 40mg  daily x 2 days, 20mg  daily x 2 days then stop. 11/07/24  Yes Trudy Anthony HERO, MD  roflumilast  (DALIRESP ) 500 MCG TABS tablet Take 500 mcg by mouth daily. 08/15/21  Yes [provider]  simvastatin  (ZOCOR ) 40 MG tablet Take 40 mg by mouth at bedtime. 01/17/16  Yes [provider]  Syringe/Needle, Disp, (SYRINGE 3CC/25GX1) 25G X 1 3 ML MISC 1 Syringe by Does not apply route every 30 (thirty) days. 07/12/22  Yes Brahmanday, Govinda R, MD  traZODone  (DESYREL ) 50 MG tablet Take 50 mg by mouth at bedtime as needed for sleep. 01/22/24 01/21/25 Yes [provider]  bisacodyl  (DULCOLAX) 5 MG EC tablet Take 1 tablet (5 mg total) by mouth daily as needed for moderate constipation. Patient not taking: Reported on 11/03/2024 09/04/23   Dorinda Drue DASEN, MD     Critical  care time: 40 minutes     Inge Lecher, AGACNP-BC [facility] Prefer epic messenger for cross cover needs If after hours, please call E-link

## 2024-11-16 NOTE — Progress Notes (Signed)
 Pt's mother and brother updated at bedside on plan of care.  All questions answered to their satisfaction.    Inge Lecher, AGACNP-BC [facility] Prefer epic messenger for cross cover needs If after hours, please call E-link

## 2024-11-16 NOTE — Progress Notes (Signed)
 Entered pt's room to find pt on the BiPAP and asking for something to drink. Ice cream and diet ginger ale provided and pt was placed on 4 liters [place]. Pt noted to have increased work of breathing and desatting to 60's with questionable wave form. Pt however denies difficult breathing. O2 increased to 6L [place] and pulse ox monitor cable changes. RT also made aware of pt with apparent SOB and audible wheezing. RT expressed that pt had just received scheduled breathing treatment. RT came to bedside and pt was placed on BiPAP again.Pt tachypnic in the 30s tachycardic in the 110-130s. NP on call made aware and orders given. Medications given without improvement and Charge and NP called to the bedside. Other medications administered per NP order as documented on MAR. Pt condition remained poor and NP elected to intubate at that time.

## 2024-11-16 NOTE — Care Management Important Message (Signed)
 Important Message  Patient Details  Name: Christian Fields MRN: [id number] Date of Birth: 04-27-1956   Important Message Given:  Yes - Medicare IM     Christian Fields 11/16/2024, 5:03 PM

## 2024-11-16 NOTE — Progress Notes (Signed)
 PHARMACY CONSULT NOTE - FOLLOW UP  Pharmacy Consult for Electrolyte Monitoring and Replacement   Recent Labs: Potassium (mmol/L)  Date Value  11/16/2024 4.8  05/17/2014 3.5   Magnesium  (mg/dL)  Date Value  87/90/7974 2.1   Calcium  (mg/dL)  Date Value  87/90/7974 9.4   Calcium , Total (mg/dL)  Date Value  95/76/7984 9.3   Albumin  (g/dL)  Date Value  87/93/7974 3.7  09/24/2013 3.6   Phosphorus (mg/dL)  Date Value  87/90/7974 3.4   Sodium (mmol/L)  Date Value  11/16/2024 145  03/31/2014 136    Assessment:  68 y.o. male admitted on 11/12/2024 with sepsis and acute hypoxic on chronic hypercarbic respiratory failure. PMH significant for COPD on home oxygen  4 L, non-small cell lung cancer s/p RXT, OSA on CPAP, GERD, history of TIA, asthma, diabetes type 2, HLD, HTN, and CKD-3a. Patient was also recently admitted from 11/26-11/30 for COPD exacerbation. Pharmacy has been consulted for replacing electrolytes  Goal of Therapy:  Electrolytes WNL  Plan:  ---no electrolyte replacement warranted for today ---recheck electrolytes in am  [name] ,PharmD Clinical Pharmacist 11/16/2024 7:08 AM

## 2024-11-16 NOTE — Procedures (Signed)
 Intubation Procedure Note  ADDISON WHIDBEE  [id number]  1956-07-15  Date:11/16/24  Time:6:40 AM   Provider Performing:Deserai Cansler L [place]    Procedure: Intubation (31500)  Indication(s) Respiratory Failure  Consent Unable to obtain consent due to emergent nature of procedure.   Anesthesia Etomidate , Versed , Fentanyl , and Rocuronium    Time Out Verified patient identification, verified procedure, site/side was marked, verified correct patient position, special equipment/implants available, medications/allergies/relevant history reviewed, required imaging and test results available.   Sterile Technique Usual hand hygeine, masks, and gloves were used   Procedure Description Patient positioned in bed supine.  Sedation given as noted above.  Patient was intubated with endotracheal tube using Glidescope.  View was Grade 1 full glottis .  Number of attempts was 1.  Colorimetric CO2 detector was consistent with tracheal placement.   Complications/Tolerance None; patient tolerated the procedure well. Chest X-ray is ordered to verify placement.   EBL Minimal   Specimen(s) None   Jenita Jama Meek, AGACNP-BC Acute Care Nurse Practitioner [facility]   [phone] / [phone] Please see Amion for details.

## 2024-11-17 DIAGNOSIS — J9622 Acute and chronic respiratory failure with hypercapnia: Secondary | ICD-10-CM | POA: Diagnosis not present

## 2024-11-17 DIAGNOSIS — J9621 Acute and chronic respiratory failure with hypoxia: Secondary | ICD-10-CM | POA: Diagnosis not present

## 2024-11-17 DIAGNOSIS — J441 Chronic obstructive pulmonary disease with (acute) exacerbation: Secondary | ICD-10-CM | POA: Diagnosis not present

## 2024-11-17 DIAGNOSIS — C3492 Malignant neoplasm of unspecified part of left bronchus or lung: Secondary | ICD-10-CM | POA: Diagnosis not present

## 2024-11-17 LAB — BASIC METABOLIC PANEL WITH GFR
Anion gap: 10 (ref 5–15)
BUN: 47 mg/dL — ABNORMAL HIGH (ref 8–23)
CO2: 34 mmol/L — ABNORMAL HIGH (ref 22–32)
Calcium: 8.7 mg/dL — ABNORMAL LOW (ref 8.9–10.3)
Chloride: 95 mmol/L — ABNORMAL LOW (ref 98–111)
Creatinine, Ser: 1.41 mg/dL — ABNORMAL HIGH (ref 0.61–1.24)
GFR, Estimated: 54 mL/min — ABNORMAL LOW (ref >60)
Glucose, Bld: 193 mg/dL — ABNORMAL HIGH (ref 70–99)
Potassium: 4.4 mmol/L (ref 3.5–5.1)
Sodium: 139 mmol/L (ref 135–145)

## 2024-11-17 LAB — GLUCOSE, CAPILLARY
Glucose-Capillary: 219 mg/dL — ABNORMAL HIGH (ref 70–99)
Glucose-Capillary: 180 mg/dL — ABNORMAL HIGH (ref 70–99)
Glucose-Capillary: 229 mg/dL — ABNORMAL HIGH (ref 70–99)
Glucose-Capillary: 163 mg/dL — ABNORMAL HIGH (ref 70–99)
Glucose-Capillary: 227 mg/dL — ABNORMAL HIGH (ref 70–99)
Glucose-Capillary: 201 mg/dL — ABNORMAL HIGH (ref 70–99)

## 2024-11-17 LAB — BLOOD GAS, VENOUS
Acid-Base Excess: 13.3 mmol/L — ABNORMAL HIGH (ref 0.0–2.0)
Bicarbonate: 39.3 mmol/L — ABNORMAL HIGH (ref 20.0–28.0)
O2 Saturation: 96.5 %
Patient temperature: 37
pCO2, Ven: 54 mmHg (ref 44–60)
pH, Ven: 7.47 — ABNORMAL HIGH (ref 7.25–7.43)
pO2, Ven: 75 mmHg — ABNORMAL HIGH (ref 32–45)

## 2024-11-17 LAB — CBC
HCT: 41.5 % (ref 39.0–52.0)
Hemoglobin: 12.6 g/dL — ABNORMAL LOW (ref 13.0–17.0)
MCH: 28.1 pg (ref 26.0–34.0)
MCHC: 30.4 g/dL (ref 30.0–36.0)
MCV: 92.6 fL (ref 80.0–100.0)
Platelets: 219 K/uL (ref 150–400)
RBC: 4.48 MIL/uL (ref 4.22–5.81)
RDW: 16 % — ABNORMAL HIGH (ref 11.5–15.5)
WBC: 23.5 K/uL — ABNORMAL HIGH (ref 4.0–10.5)
nRBC: 0 % (ref 0.0–0.2)

## 2024-11-17 LAB — LEGIONELLA PNEUMOPHILA SEROGP 1 UR AG: L. pneumophila Serogp 1 Ur Ag: NEGATIVE

## 2024-11-17 LAB — TRIGLYCERIDES: Triglycerides: 148 mg/dL (ref <150)

## 2024-11-17 LAB — PHOSPHORUS: Phosphorus: 2.9 mg/dL (ref 2.5–4.6)

## 2024-11-17 LAB — MAGNESIUM: Magnesium: 2 mg/dL (ref 1.7–2.4)

## 2024-11-17 MED ORDER — MIDAZOLAM HCL 2 MG/2ML IJ SOLN
INTRAMUSCULAR | Status: AC
  Administered 2024-11-17: 2 mg
  Filled 2024-11-17: qty 2

## 2024-11-17 MED ORDER — IPRATROPIUM-ALBUTEROL 0.5-2.5 (3) MG/3ML IN SOLN
3.0000 mL | RESPIRATORY_TRACT | Status: DC
  Administered 2024-11-17 – 2024-11-23 (×36): 3 mL via RESPIRATORY_TRACT
  Filled 2024-11-17 (×37): qty 3

## 2024-11-17 MED ORDER — MIDAZOLAM HCL (PF) 2 MG/2ML IJ SOLN
4.0000 mg | INTRAMUSCULAR | Status: DC | PRN
  Administered 2024-11-17: 2 mg via INTRAVENOUS
  Filled 2024-11-17: qty 4

## 2024-11-17 NOTE — TOC Progression Note (Signed)
 Transition of Care [facility]) - Progression Note    Patient Details  Name: SHIHAB STATES MRN: [id number] Date of Birth: [birth year]  [facility]) CM/SW Contact  K'La JINNY Ruts, LCSW Phone Number: 11/17/2024, 12:00 PM  Clinical Narrative:    Chart reviewed. Patient is intubated. Called the patient daughter to complete readmission preventative screen. Left the patient daughter a VM. Waiting for a call back.                      Expected Discharge Plan and Services                                               Social Drivers of Health (SDOH) Interventions SDOH Screenings   Food Insecurity: No Food Insecurity (11/15/2024)  Housing: Low Risk  (11/15/2024)  Transportation Needs: No Transportation Needs (11/15/2024)  Utilities: Not At Risk (11/15/2024)  Financial Resource Strain: Low Risk  (02/05/2024)   Received from [facility]  Social Connections: Socially Isolated (11/15/2024)  Tobacco Use: Medium Risk (11/12/2024)    Readmission Risk Interventions    02/09/2024   11:40 AM 10/09/2023   11:34 AM 10/07/2023    1:48 PM  Readmission Risk Prevention Plan  Transportation Screening Complete  Complete  Medication Review [employer]) Complete  Complete  PCP or Specialist appointment within 3-5 days of discharge Complete    HRI or Home Care Consult Complete  Complete  SW Recovery Care/Counseling Consult Complete Complete   Palliative Care Screening Not Applicable  Not Applicable  Skilled Nursing Facility Complete  Complete

## 2024-11-17 NOTE — Plan of Care (Signed)
°  Problem: Clinical Measurements: Goal: Ability to maintain clinical measurements within normal limits will improve Outcome: Progressing Goal: Diagnostic test results will improve Outcome: Progressing Goal: Cardiovascular complication will be avoided Outcome: Progressing   Problem: Nutrition: Goal: Adequate nutrition will be maintained Outcome: Progressing   Problem: Coping: Goal: Level of anxiety will decrease Outcome: Progressing   Problem: Pain Managment: Goal: General experience of comfort will improve and/or be controlled Outcome: Progressing

## 2024-11-17 NOTE — Progress Notes (Signed)
 Patient placed on spontaneous mode at 0734.  At 0755 patient was noted to have markedly increased respiratory rate, heart rate and became very agitated.  Patient was returned to previous ventilator settings.

## 2024-11-17 NOTE — Progress Notes (Signed)
 PHARMACY CONSULT NOTE - FOLLOW UP  Pharmacy Consult for Electrolyte Monitoring and Replacement   Recent Labs: Potassium (mmol/L)  Date Value  11/17/2024 4.4  05/17/2014 3.5   Magnesium  (mg/dL)  Date Value  87/89/7974 2.0   Calcium  (mg/dL)  Date Value  87/89/7974 8.7 (L)   Calcium , Total (mg/dL)  Date Value  95/76/7984 9.3   Albumin  (g/dL)  Date Value  87/93/7974 3.7  09/24/2013 3.6   Phosphorus (mg/dL)  Date Value  87/89/7974 2.9   Sodium (mmol/L)  Date Value  11/17/2024 139  03/31/2014 136    Assessment:  68 y.o. male admitted on 11/12/2024 with sepsis and acute hypoxic on chronic hypercarbic respiratory failure. PMH significant for COPD on home oxygen  4 L, non-small cell lung cancer s/p RXT, OSA on CPAP, GERD, history of TIA, asthma, diabetes type 2, HLD, HTN, and CKD-3a. Patient was also recently admitted from 11/26-11/30 for COPD exacerbation. Pharmacy has been consulted for replacing electrolytes  Nutrition: Vital AF at 30 mL/hr + FWF 30 mL every 4h  Goal of Therapy:  Electrolytes WNL  Plan:  ---no electrolyte replacement warranted for today ---recheck electrolytes in am  [name] ,PharmD Clinical Pharmacist 11/17/2024 7:18 AM

## 2024-11-17 NOTE — Progress Notes (Signed)
 NAME:  Christian Fields, MRN:  [id number], DOB:  01/18/1956, LOS: 5 ADMISSION DATE:  11/12/2024, CONSULTATION DATE:  11/15/2024 REFERRING MD:  Dr. Awanda, CHIEF COMPLAINT:  Acute Respiratory Distress   Brief Pt Description / Synopsis:  68 y.o. male with PMHx significant for COPD and lung cancer admitted with Acute on Chronic Hypoxic and Hypercapnic Respiratory Failure due to Acute COPD Exacerbation due to Parainfluenza Infection requiring BiPAP.  Failed trial of BiPAP on 11/16/24 requiring intubation and mechanical ventilation.   History of Present Illness:  Christian Fields is a 68 year old male with a history of lifelong smoking and advanced COPD, had a diagnosis of stage I left upper lobe lung cancer after noticing an interval increase in the size of nodule from 1 cm to 1.4 in 2021. He went straight to SBRT due to being a poor surgical candidate for biopsy. He also is having recurrent COPD exacerbations and having rehospitalization for this. He has chronic hypoxemia and uses oxygen  at home up to 5 L/min. He has chronic physical deconditioning. He has chronic peripheral edema and also follows up with cardiology for essential hypertension and dyslipidemia. He also has a background history of CKD 3 A, sleep apnea noncompliant with CPAP therapy, polysubstance abuse and abdominal aortic aneurysm. He came to the hospital on this admission with complaints of worsening shortness of breath x 1 day was noted to have oxygen  saturation approximately 60% on his usual 4 L/min nasal cannula he denies sick contacts or fevers at home. He had a venous blood gas performed in the ED which showed mild acidemia with hypercapnia. He was treated with nebulizer therapy and steroids as well as BiPAP and reports slight improvement since then. He is started on empiric antibiotics with cefepime  and vancomycin  for possible pneumonia. PCCM consultation placed due to recurrent hospitalization with advanced COPD, chronic hypoxemia and lung cancer.     Please see Significant Hospital Events section below for full detailed hospital course.   Pertinent  Medical History   Past Medical History:  Diagnosis Date   Anemia    Anxiety    Arthritis    Asthma    Cancer (HCC)    Basal Cell Skin Cancer   Chronic back pain    COPD (chronic obstructive pulmonary disease) (HCC)    Depression    Diabetes mellitus (HCC)    Dyspnea    GERD (gastroesophageal reflux disease)    Gout    Gout    Headache    History of blood clots    Left Leg--July 2018   History of kidney stones    Hyperlipidemia    Hyperlipidemia    Hypertension    Kidney stones    Neuropathy    On home oxygen  therapy    2 L / M   Pneumonia 06/2017   Sleep apnea    Ulcer of foot (HCC)    Right    Micro Data:  12/5: COVID/FLU/RSV PCR>> negative 12/5: RVP >> + Parainfluenza  12/5: MRSA PCR>> negative  12/9: Tracheal aspirate>> 12/9: Strep pneumo urinary antigen>>negative 12/9: Legionella urinary antigen>>  Antimicrobials:   Anti-infectives (From admission, onward)    Start     Dose/Rate Route Frequency Ordered Stop   11/16/24 1200  piperacillin -tazobactam (ZOSYN ) IVPB 3.375 g        3.375 g 12.5 mL/hr over 240 Minutes Intravenous Every 8 hours 11/16/24 1110     11/15/24 1215  azithromycin  (ZITHROMAX ) 500 mg in sodium chloride  0.9 % 250 mL IVPB  500 mg 250 mL/hr over 60 Minutes Intravenous Every 24 hours 11/15/24 1129     11/15/24 1215  cefTRIAXone  (ROCEPHIN ) 2 g in sodium chloride  0.9 % 100 mL IVPB  Status:  Discontinued        2 g 200 mL/hr over 30 Minutes Intravenous Every 24 hours 11/15/24 1129 11/16/24 1030   11/13/24 1900  vancomycin  (VANCOREADY) IVPB 1250 mg/250 mL  Status:  Discontinued        1,250 mg 166.7 mL/hr over 90 Minutes Intravenous Every 24 hours 11/12/24 1744 11/12/24 1749   11/13/24 1000  azithromycin  (ZITHROMAX ) tablet 250 mg  Status:  Discontinued        250 mg Oral Daily 11/13/24 0819 11/15/24 1129   11/13/24 0500   piperacillin -tazobactam (ZOSYN ) IVPB 3.375 g  Status:  Discontinued        3.375 g 12.5 mL/hr over 240 Minutes Intravenous Every 8 hours 11/12/24 2112 11/13/24 0814   11/12/24 1900  vancomycin  (VANCOREADY) IVPB 1750 mg/350 mL  Status:  Discontinued        1,750 mg 175 mL/hr over 120 Minutes Intravenous  Once 11/12/24 1744 11/12/24 1749   11/12/24 1700  ceFEPIme  (MAXIPIME ) 2 g in sodium chloride  0.9 % 100 mL IVPB  Status:  Discontinued        2 g 200 mL/hr over 30 Minutes Intravenous Every 12 hours 11/12/24 1615 11/12/24 1745       Significant Hospital Events: Including procedures, antibiotic start and stop dates in addition to other pertinent events   12/5: Admitted by [facility].  Pulmonary consulted for severe AECOPD requiring BiPAP. 11/14/24-patient with Parainfluenza induced AECOPD. He was moved to Step down unit due to progressive dyspnea and is on BIPAP. He is on solumedrol BID and nebulizer therapy. Will continue to follow. Anticipate slow improvement over next 3-5 weeks with this virus. Hopefully dc in 3-4days.  11/15/24: Mid morning pt with increased WOB and accessory muscle use.  PCCM consulted.  Given 40 mg Lasix  by [name].  Increase duonebs to q4h and start Ceftriaxone  and increase Azithromycin  dose to 500 mg.  High risk for intubation.  11/16/24: Acutely decompensated, became severely hypoxic (sats 60's) with increased WOB, required intubation and mechanical ventilation. Given acute decompensation and persistent sinus tachycardia, CTa obtained which is negative for PE, does show possible superimposed bacterial pneumonia.  Ceftriaxone  broadened to Zosyn . Telemetry alarming for ST elevation, obtained EKG and reviewed with Dr. Mady of Cardiology, felt not to be STEMI, obtaining Troponin and Echo. 11/17/24: No acute events overnight. Cr worsening at 1.41 this am. Remains on Zosyn . Awake and alert this am, transitioned to SBT but failed quickly. Became tachycardic and hypertensive. Restarted sedation.    Interim History / Subjective:  As outlined above under Significant Hospital Events section  Objective   Blood pressure 105/77, pulse (!) 111, temperature 98.3 F (36.8 C), temperature source Axillary, resp. rate 19, height 5' 6 (1.676 m), weight 72.5 kg, SpO2 95%.    Vent Mode: PRVC FiO2 (%):  [30 %-40 %] 30 % Set Rate:  [16 bmp] 16 bmp Vt Set:  [450 mL] 450 mL PEEP:  [5 cmH20] 5 cmH20 Plateau Pressure:  [15 cmH20-17 cmH20] 17 cmH20   Intake/Output Summary (Last 24 hours) at 11/17/2024 0821 Last data filed at 11/17/2024 0752 Gross per 24 hour  Intake 2647.46 ml  Output 1575 ml  Net 1072.46 ml   Filed Weights   11/12/24 1353 11/14/24 1818 11/17/24 0500  Weight: 77 kg 75.8 kg 72.5  kg    Examination: General: Acute on chronically ill appearing male, intubated and sedated, in NAD HENT: Atraumatic, normocephalic, neck supple, no JVD Lungs: Coarse breath sounds throughout (R>L) with expiratory wheezing, synchronous with vent  Cardiovascular: Tachycardia, Regular rhythm, s1s2, no M/R/G Abdomen: Soft, nontender, nondistended, no rebound/guarding, bowel sounds x 4 Extremities: Normal bulk and tone, no deformities, warm and well perfused, trace edema BLE Neuro: intubated and sedated, minimally withdraws from pain, PERRL (failed sbt, restarted sedation) GU: Foley catheter in place draining yellow urine   Resolved Hospital Problem list     Assessment & Plan:   #Sedation needs in setting of mechanical ventilation Hx: Depression. Anxiety, sleep apnea - Maintain a RASS goal of 0 to -1 - Fentanyl  & Propofol  to maintain RASS goal - Avoid sedating medications as able - Control pain as able - Daily WUA; able to follow commands this am but failed SBT - Given 2mg  Versed  and restarted sedation as outlined above  #Acute on chronic Hypoxic & Hypercapnic Respiratory Failure #Acute COPD Exacerbation due to Parainfluenza Viral Infection #Superimposed bacterial pneumonia  #Lung  Cancer Hx: COPD on home oxygen , Asthma  CTa Chest PE 12/9: negative for PE, new multifocal airspace consolidation in RLL, RML, and LLL, stable 2.6 cm pleural based mass like consolidation in lateral LUL, enlarged central pulmonary arteries suggesting pulmonary artery hypertension  - Full vent support - Implement lung protective strategies - Plateau pressures less than 30 cm H20 - Wean FiO2 & PEEP as tolerated to maintain O2 sats 88 to 92% - Follow intermittent Chest X-ray & ABG as needed - SBT when respiratory parameters met and mental status permits - SBT this am; failed and restarted sedation - Implement VAP Bundle - Bronchodilators & Pulmicort  nebs  - Continue IV Solumedrol 40 mg BID - Azithromycin  and Zosyn  - Diuresis as BP and renal function permits ~ Hold lasix   - Pulmonary toilet as able  #Tachycardia, likely compensatory in nature d/t respiratory failure #Mildly Elevated Troponin, suspect demand ischemia  Hx: HTN, HLD 12/9: Telemetry alarming ST elevation, EKG with ST elevation in inferior leads (but no reciprocal changes), reviewed with Dr. Mady, felt not to be a STEMI 11/17/24 ECHO: LVEF 60-65%, mild LVH, severely elevated pulmonary artery pressure with mildly reduced RV systolic function; moderate thickening of aortic valve.  - Continuous cardiac monitoring - Gentle IV fluids - Vasopressors to maintain MAP goal >65 ~ intermittent use, on 2mcg Levophed  this am - Continue clopidogrel  and ezetimibe  - Trend HS Troponin: 32 > 32 - Troponin and ECHO ruled out concern for STEMI yesterday  #Mild AKI - Trend BMP and monitor renal function - Creatinine this am 1.41 - Strict I/O - Avoid nephrotoxins as able - Ensure adequate renal perfusion - ICU electrolyte replacement protocol - Pharmacy to assist with replacement as indicated  #GI Prophylaxis #GERD - Continue protonix  40mg  - Constipation protocol prn - Antiemetics prn - Diet: Tube feeds  #Type II Diabetes Mellitus -  ICU hypo/hyperglycemia protocol - SSI - Goal Range 140-180 - CBG Q4h  #Parainfluenza Viral Infection #Superimposed bacterial pneumonia  - Trend WBC and monitor fever curve - WBC trending down - Procalcitonin 0.20 - Follow cultures as above - Continue empiric Azithromycin  for AECOPD - Broadened to Zosyn  12/9  - Narrow ABX following culture and sensitivities - Tracheal aspirate pending    Best Practice (right click and Reselect all SmartList Selections daily)   Diet/type: NPO, start tube feeds  DVT prophylaxis: LMWH GI prophylaxis: PPI Lines: N/A  Foley:  yes, and still needed  Code Status:  full code Last date of multidisciplinary goals of care discussion [12/10]  12/10: Will update pt's family when they arrive at bedside.    Labs   CBC: Recent Labs  Lab 11/13/24 0017 11/14/24 0459 11/15/24 0636 11/16/24 0310 11/17/24 0315  WBC 12.7* 24.0* 31.7* 26.2* 23.5*  HGB 12.0* 13.1 13.7 14.8 12.6*  HCT 40.4 44.8 45.6 50.5 41.5  MCV 96.4 98.5 95.4 96.4 92.6  PLT 256 274 254 221 219    Basic Metabolic Panel: Recent Labs  Lab 11/13/24 0017 11/14/24 0459 11/15/24 0636 11/16/24 0310 11/17/24 0315  NA 138 137 143 145 139  K 4.8 4.7 4.7 4.8 4.4  CL 98 99 97* 94* 95*  CO2 27 26 37* 39* 34*  GLUCOSE 341* 219* 171* 79 193*  BUN 42* 34* 40* 44* 47*  CREATININE 1.47* 1.18 1.17 1.20 1.41*  CALCIUM  8.5* 8.6* 9.5 9.4 8.7*  MG  --  2.4 2.0 2.1 2.0  PHOS  --   --  3.1 3.4 2.9   GFR: Estimated Creatinine Clearance: 44.6 mL/min (A) (by C-G formula based on SCr of 1.41 mg/dL (H)). Recent Labs  Lab 11/13/24 1106 11/14/24 0459 11/14/24 1704 11/15/24 0636 11/15/24 1237 11/16/24 0310 11/17/24 0315  PROCALCITON 0.23  --  0.18  --  0.20  --   --   WBC  --  24.0*  --  31.7*  --  26.2* 23.5*    Liver Function Tests: Recent Labs  Lab 11/12/24 1434 11/13/24 0017  AST 19 18  ALT 26 21  ALKPHOS 113 102  BILITOT 0.3 <0.2  PROT 7.1 6.3*  ALBUMIN  4.2 3.7   No results  for input(s): LIPASE, AMYLASE in the last 168 hours. No results for input(s): AMMONIA in the last 168 hours.  ABG    Component Value Date/Time   PHART 7.44 11/16/2024 0721   PCO2ART 67 (HH) 11/16/2024 0721   PO2ART 322 (H) 11/16/2024 0721   HCO3 44.0 (H) 11/16/2024 1207   ACIDBASEDEF 1.4 11/03/2024 1854   O2SAT 76.8 11/16/2024 1207     Coagulation Profile: Recent Labs  Lab 11/13/24 0017  INR 1.0    Cardiac Enzymes: No results for input(s): CKTOTAL, CKMB, CKMBINDEX, TROPONINI in the last 168 hours.  HbA1C: Hemoglobin A1C  Date/Time Value Ref Range Status  09/25/2013 03:56 AM 6.3 4.2 - 6.3 % Final    Comment:    The American Diabetes Association recommends that a primary goal of therapy should be <7% and that physicians should reevaluate the treatment regimen in patients with HbA1c values consistently >8%.    Hgb A1c MFr Bld  Date/Time Value Ref Range Status  07/16/2024 03:19 AM 6.0 (H) 4.8 - 5.6 % Final    Comment:    (NOTE)         Prediabetes: 5.7 - 6.4         Diabetes: >6.4         Glycemic control for adults with diabetes: <7.0   10/17/2023 04:51 AM 5.7 (H) 4.8 - 5.6 % Final    Comment:    (NOTE) Pre diabetes:          5.7%-6.4%  Diabetes:              >6.4%  Glycemic control for   <7.0% adults with diabetes     CBG: Recent Labs  Lab 11/16/24 1541 11/16/24 1933 11/16/24 2317 11/17/24 0333 11/17/24 0725  GLUCAP 174*  207* 193* 219* 180*    Review of Systems:   Unable to assess due to intubation/sedation/critical illness   Past Medical History:  He,  has a past medical history of Anemia, Anxiety, Arthritis, Asthma, Cancer (HCC), Chronic back pain, COPD (chronic obstructive pulmonary disease) (HCC), Depression, Diabetes mellitus (HCC), Dyspnea, GERD (gastroesophageal reflux disease), Gout, Gout, Headache, History of blood clots, History of kidney stones, Hyperlipidemia, Hyperlipidemia, Hypertension, Kidney stones, Neuropathy, On  home oxygen  therapy, Pneumonia (06/2017), Sleep apnea, and Ulcer of foot (HCC).   Surgical History:   Past Surgical History:  Procedure Laterality Date   APPENDECTOMY     DG FEET 2 VIEWS BILAT     IRRIGATION AND DEBRIDEMENT FOOT Left 07/11/2023   Procedure: IRRIGATION AND DEBRIDEMENT FOOT;  Surgeon: Ashley Soulier, DPM;  Location: [facility] ORS;  Service: Orthopedics/Podiatry;  Laterality: Left;   LIPOMA EXCISION Right 08/15/2017   Procedure: EXCISION TUMOR(CYST) FOOT;  Surgeon: Lilli Cough, DPM;  Location: [facility] ORS;  Service: Podiatry;  Laterality: Right;   LOWER EXTREMITY ANGIOGRAPHY Left 07/03/2023   Procedure: Lower Extremity Angiography;  Surgeon: Marea Selinda RAMAN, MD;  Location: [facility] IN[facility];  Service: Cardiovascular;  Laterality: Left;   OTHER SURGICAL HISTORY Bilateral Foot surgery     Social History:   reports that he has quit smoking. His smoking use included cigarettes. He started smoking about 7 years ago. He has a 4 pack-year smoking history. He has never used smokeless tobacco. He reports current alcohol  use of about 1.0 standard drink of alcohol  per week. He reports that he does not use drugs.   Family History:  His family history includes Hypertension in his mother.   Allergies Allergies  Allergen Reactions   Bee Venom Anaphylaxis   Linezolid  Other (See Comments)    Thrombocytopenia, hyperlactatemia   Azithromycin  Itching and Rash     Home Medications  Prior to Admission medications   Medication Sig Start Date End Date Taking? Authorizing Provider  acetaminophen  (TYLENOL ) 325 MG tablet Take 650 mg by mouth every 6 (six) hours as needed.   Yes [provider]  albuterol  (VENTOLIN  HFA) 108 (90 Base) MCG/ACT inhaler Inhale 2 puffs into the lungs every 4 (four) hours as needed for wheezing or shortness of breath.   Yes [provider]  allopurinol  (ZYLOPRIM ) 300 MG tablet Take 600 mg by mouth daily. 02/24/20  Yes [provider]  aspirin  EC  81 MG tablet Take 1 tablet by mouth daily.   Yes [provider]  azelastine  (ASTELIN ) 0.1 % nasal spray Place 2 sprays into both nostrils 2 (two) times daily as needed. 05/27/23  Yes [provider]  Buprenorphine  HCl-Naloxone  HCl 8-2 MG FILM Place 1 Film under the tongue in the morning, at noon, and at bedtime.   Yes [provider]  clopidogrel  (PLAVIX ) 75 MG tablet Take 1 tablet (75 mg total) by mouth daily. 02/02/21  Yes Leotis Bogus, MD  dapagliflozin  propanediol (FARXIGA ) 10 MG TABS tablet Take 1 tablet by mouth daily. 03/16/24 03/16/25 Yes [provider]  DULoxetine  (CYMBALTA ) 60 MG capsule Take 60 mg by mouth daily.   Yes [provider]  ezetimibe  (ZETIA ) 10 MG tablet Take 10 mg by mouth daily. 02/24/20  Yes [provider]  fluticasone  (FLONASE ) 50 MCG/ACT nasal spray Place 1 spray into both nostrils daily as needed for allergies. 10/16/18  Yes [provider]  Fluticasone -Umeclidin-Vilant (TRELEGY ELLIPTA) 100-62.5-25 MCG/ACT AEPB Inhale 1 puff into the lungs in the morning. 09/12/21  Yes [provider]  furosemide  (LASIX ) 20 MG tablet Take 1 tablet (20 mg total) by mouth daily. 02/23/24  Yes Jens Durand, MD  gabapentin  (NEURONTIN ) 800 MG tablet Take 800 mg by mouth 4 (four) times daily. 10/22/23  Yes [provider]  ipratropium-albuterol  (DUONEB) 0.5-2.5 (3) MG/3ML SOLN Take 3 mLs by nebulization every 6 (six) hours as needed (wheezing).   Yes [provider]  metFORMIN  (GLUCOPHAGE ) 1000 MG tablet Take 1,000 mg by mouth daily.   Yes [provider]  metoprolol  succinate (TOPROL -XL) 50 MG 24 hr tablet Take 50 mg by mouth daily. 02/24/20  Yes [provider]  Multiple Vitamins-Minerals (MULTIVITAMIN ADULT, MINERALS, PO) Take 1 tablet by mouth every morning.   Yes [provider]  naloxone  (NARCAN ) nasal spray 4 mg/0.1 mL Place 1 spray into the nose once.   Yes [provider]  OHTUVAYRE  3 MG/2.5ML SUSP Take 5 mLs by mouth daily. 07/05/24  Yes [provider]  ondansetron  (ZOFRAN -ODT) 4 MG disintegrating tablet Take 4 mg by mouth every 8 (eight) hours as needed for nausea.   Yes [provider]  OXYGEN  Inhale 4 L into the lungs.   Yes [provider]  predniSONE  (DELTASONE ) 20 MG tablet 60mg  daily x 2 days, 40mg  daily x 2 days, 20mg  daily x 2 days then stop. 11/07/24  Yes Trudy Anthony HERO, MD  roflumilast  (DALIRESP ) 500 MCG TABS tablet Take 500 mcg by mouth daily. 08/15/21  Yes [provider]  simvastatin  (ZOCOR ) 40 MG tablet Take 40 mg by mouth at bedtime. 01/17/16  Yes [provider]  Syringe/Needle, Disp, (SYRINGE 3CC/25GX1) 25G X 1 3 ML MISC 1 Syringe by Does not apply route every 30 (thirty) days. 07/12/22  Yes Brahmanday, Govinda R, MD  traZODone  (DESYREL ) 50 MG tablet Take 50 mg by mouth at bedtime as needed for sleep. 01/22/24 01/21/25 Yes [provider]  bisacodyl  (DULCOLAX) 5 MG EC tablet Take 1 tablet (5 mg total) by mouth daily as needed for moderate constipation. Patient not taking: Reported on 11/03/2024 09/04/23   Dorinda Drue DASEN, MD     Critical care time: 40 minutes     Robet Kim, PA-C [facility] PCCM Team Contact Info: [phone]

## 2024-11-17 NOTE — Plan of Care (Signed)
  Problem: Education: Goal: Knowledge of General Education information will improve Description: Including pain rating scale, medication(s)/side effects and non-pharmacologic comfort measures Outcome: Progressing   Problem: Health Behavior/Discharge Planning: Goal: Ability to manage health-related needs will improve Outcome: Progressing   Problem: Clinical Measurements: Goal: Ability to maintain clinical measurements within normal limits will improve Outcome: Progressing Goal: Will remain free from infection Outcome: Progressing Goal: Diagnostic test results will improve Outcome: Progressing Goal: Respiratory complications will improve Outcome: Progressing Goal: Cardiovascular complication will be avoided Outcome: Progressing   Problem: Activity: Goal: Risk for activity intolerance will decrease Outcome: Progressing   Problem: Nutrition: Goal: Adequate nutrition will be maintained Outcome: Progressing   Problem: Coping: Goal: Level of anxiety will decrease Outcome: Progressing   Problem: Elimination: Goal: Will not experience complications related to bowel motility Outcome: Progressing Goal: Will not experience complications related to urinary retention Outcome: Progressing   Problem: Pain Managment: Goal: General experience of comfort will improve and/or be controlled Outcome: Progressing   Problem: Safety: Goal: Ability to remain free from injury will improve Outcome: Progressing   Problem: Skin Integrity: Goal: Risk for impaired skin integrity will decrease Outcome: Progressing   Problem: Education: Goal: Knowledge of disease or condition will improve Outcome: Progressing

## 2024-11-18 DIAGNOSIS — C3492 Malignant neoplasm of unspecified part of left bronchus or lung: Secondary | ICD-10-CM | POA: Diagnosis not present

## 2024-11-18 DIAGNOSIS — J441 Chronic obstructive pulmonary disease with (acute) exacerbation: Secondary | ICD-10-CM | POA: Diagnosis not present

## 2024-11-18 DIAGNOSIS — J9622 Acute and chronic respiratory failure with hypercapnia: Secondary | ICD-10-CM | POA: Diagnosis not present

## 2024-11-18 DIAGNOSIS — J9621 Acute and chronic respiratory failure with hypoxia: Secondary | ICD-10-CM | POA: Diagnosis not present

## 2024-11-18 LAB — BASIC METABOLIC PANEL WITH GFR
Anion gap: 9 (ref 5–15)
BUN: 57 mg/dL — ABNORMAL HIGH (ref 8–23)
CO2: 34 mmol/L — ABNORMAL HIGH (ref 22–32)
Calcium: 8.7 mg/dL — ABNORMAL LOW (ref 8.9–10.3)
Chloride: 97 mmol/L — ABNORMAL LOW (ref 98–111)
Creatinine, Ser: 1.39 mg/dL — ABNORMAL HIGH (ref 0.61–1.24)
GFR, Estimated: 55 mL/min — ABNORMAL LOW (ref >60)
Glucose, Bld: 174 mg/dL — ABNORMAL HIGH (ref 70–99)
Potassium: 4.3 mmol/L (ref 3.5–5.1)
Sodium: 139 mmol/L (ref 135–145)

## 2024-11-18 LAB — CBC
HCT: 37.1 % — ABNORMAL LOW (ref 39.0–52.0)
Hemoglobin: 11.3 g/dL — ABNORMAL LOW (ref 13.0–17.0)
MCH: 28.5 pg (ref 26.0–34.0)
MCHC: 30.5 g/dL (ref 30.0–36.0)
MCV: 93.5 fL (ref 80.0–100.0)
Platelets: 178 K/uL (ref 150–400)
RBC: 3.97 MIL/uL — ABNORMAL LOW (ref 4.22–5.81)
RDW: 15.9 % — ABNORMAL HIGH (ref 11.5–15.5)
WBC: 19.9 K/uL — ABNORMAL HIGH (ref 4.0–10.5)
nRBC: 0 % (ref 0.0–0.2)

## 2024-11-18 LAB — GLUCOSE, CAPILLARY
Glucose-Capillary: 180 mg/dL — ABNORMAL HIGH (ref 70–99)
Glucose-Capillary: 160 mg/dL — ABNORMAL HIGH (ref 70–99)
Glucose-Capillary: 244 mg/dL — ABNORMAL HIGH (ref 70–99)
Glucose-Capillary: 191 mg/dL — ABNORMAL HIGH (ref 70–99)
Glucose-Capillary: 162 mg/dL — ABNORMAL HIGH (ref 70–99)
Glucose-Capillary: 196 mg/dL — ABNORMAL HIGH (ref 70–99)

## 2024-11-18 LAB — PHOSPHORUS: Phosphorus: 3 mg/dL (ref 2.5–4.6)

## 2024-11-18 LAB — MAGNESIUM: Magnesium: 2.1 mg/dL (ref 1.7–2.4)

## 2024-11-18 MED ORDER — VANCOMYCIN HCL IN DEXTROSE 1-5 GM/200ML-% IV SOLN
1000.0000 mg | INTRAVENOUS | Status: DC
  Administered 2024-11-19: 1000 mg via INTRAVENOUS
  Filled 2024-11-18: qty 200

## 2024-11-18 MED ORDER — MIDAZOLAM HCL 2 MG/2ML IJ SOLN
INTRAMUSCULAR | Status: AC
  Filled 2024-11-18: qty 4

## 2024-11-18 MED ORDER — VANCOMYCIN HCL 1250 MG/250ML IV SOLN
1250.0000 mg | Freq: Once | INTRAVENOUS | Status: AC
  Administered 2024-11-18: 1250 mg via INTRAVENOUS
  Filled 2024-11-18: qty 250

## 2024-11-18 MED ORDER — HYDROMORPHONE HCL 1 MG/ML IJ SOLN
2.0000 mg | INTRAMUSCULAR | Status: AC
  Administered 2024-11-18: 2 mg via INTRAVENOUS
  Filled 2024-11-18: qty 2

## 2024-11-18 MED ORDER — MIDAZOLAM HCL (PF) 2 MG/2ML IJ SOLN
4.0000 mg | Freq: Once | INTRAMUSCULAR | Status: AC
  Administered 2024-11-18: 4 mg via INTRAVENOUS

## 2024-11-18 MED ORDER — DEXMEDETOMIDINE HCL IN NACL 400 MCG/100ML IV SOLN
0.0000 ug/kg/h | INTRAVENOUS | Status: DC
  Administered 2024-11-18: 0.4 ug/kg/h via INTRAVENOUS
  Administered 2024-11-18: 1 ug/kg/h via INTRAVENOUS
  Administered 2024-11-18: 0.8 ug/kg/h via INTRAVENOUS
  Administered 2024-11-19 (×2): 1 ug/kg/h via INTRAVENOUS
  Filled 2024-11-18 (×5): qty 100

## 2024-11-18 NOTE — Progress Notes (Signed)
 Patient placed on SBT mode at 1533.  Patient experienced a marked increase in heart rate and became very agitated and was placed back on previous ventilator settings at 0418.

## 2024-11-18 NOTE — Progress Notes (Signed)
 Pharmacy Antibiotic Note  Christian Fields is a 68 y.o. male w/ PMH significant for COPD on home oxygen  4 L, non-small cell lung cancer s/p RXT, OSA on CPAP, GERD, history of TIA, asthma, diabetes type 2, HLD, HTN, and CKD-3a. Patient was also recently admitted from 11/26-11/30 for COPD exacerbation. admitted on 11/12/2024 with pneumonia.  Pharmacy has been consulted for vancomycin  dosing.  Plan: start vancomycin  1250 mg IV x 1 then 1000 mg IV every 24 hours Goal AUC 400-550. Expected AUC: 466 SCr used: 1.39 mg/dL  Height: 5' 6 (832.3 cm) Weight: 71.3 kg (157 lb 3 oz) IBW/kg (Calculated) : 63.8  Temp (24hrs), Avg:99.5 F (37.5 C), Min:96.8 F (36 C), Max:102.1 F (38.9 C)  Recent Labs  Lab 11/14/24 0459 11/15/24 0636 11/16/24 0310 11/17/24 0315 11/18/24 0332  WBC 24.0* 31.7* 26.2* 23.5* 19.9*  CREATININE 1.18 1.17 1.20 1.41* 1.39*    Estimated Creatinine Clearance: 45.3 mL/min (A) (by C-G formula based on SCr of 1.39 mg/dL (H)).    Allergies[1]  Antimicrobials this admission: 12/06 azithromycin  >> 12/10 12/09 Zosyn  >>  12/11 vancomycin  >>  Microbiology results: 12/09 Sputum: E faecalis (pending)  12/05 MRSA PCR: negative  Thank you for allowing pharmacy to be a part of this patients care.  Christian Fields 11/18/2024 10:17 AM     [1]  Allergies Allergen Reactions   Bee Venom Anaphylaxis   Linezolid  Other (See Comments)    Thrombocytopenia, hyperlactatemia   Azithromycin  Itching and Rash

## 2024-11-18 NOTE — Progress Notes (Signed)
 PHARMACY CONSULT NOTE - FOLLOW UP  Pharmacy Consult for Electrolyte Monitoring and Replacement   Recent Labs: Potassium (mmol/L)  Date Value  11/18/2024 4.3  05/17/2014 3.5   Magnesium  (mg/dL)  Date Value  87/88/7974 2.1   Calcium  (mg/dL)  Date Value  87/88/7974 8.7 (L)   Calcium , Total (mg/dL)  Date Value  95/76/7984 9.3   Albumin  (g/dL)  Date Value  87/93/7974 3.7  09/24/2013 3.6   Phosphorus (mg/dL)  Date Value  87/88/7974 3.0   Sodium (mmol/L)  Date Value  11/18/2024 139  03/31/2014 136    Assessment:  68 y.o. male admitted on 11/12/2024 with sepsis and acute hypoxic on chronic hypercarbic respiratory failure. PMH significant for COPD on home oxygen  4 L, non-small cell lung cancer s/p RXT, OSA on CPAP, GERD, history of TIA, asthma, diabetes type 2, HLD, HTN, and CKD-3a. Patient was also recently admitted from 11/26-11/30 for COPD exacerbation. Pharmacy has been consulted for replacing electrolytes  Nutrition: Vital AF at 30 mL/hr + FWF 30 mL every 4h  Goal of Therapy:  Electrolytes WNL  Plan:  ---no electrolyte replacement warranted for today ---recheck electrolytes in am  [name] ,PharmD Clinical Pharmacist 11/18/2024 7:14 AM

## 2024-11-18 NOTE — Plan of Care (Signed)
°  Problem: Clinical Measurements: Goal: Will remain free from infection Outcome: Progressing Goal: Diagnostic test results will improve Outcome: Progressing Goal: Respiratory complications will improve Outcome: Progressing Goal: Cardiovascular complication will be avoided Outcome: Progressing   Problem: Activity: Goal: Risk for activity intolerance will decrease Outcome: Progressing   Problem: Coping: Goal: Level of anxiety will decrease Outcome: Progressing   Problem: Pain Managment: Goal: General experience of comfort will improve and/or be controlled Outcome: Progressing   Problem: Safety: Goal: Ability to remain free from injury will improve Outcome: Progressing   Problem: Education: Goal: Knowledge of disease or condition will improve Outcome: Progressing

## 2024-11-18 NOTE — Plan of Care (Signed)
°  Problem: Clinical Measurements: Goal: Ability to maintain clinical measurements within normal limits will improve Outcome: Progressing Goal: Diagnostic test results will improve Outcome: Progressing Goal: Respiratory complications will improve Outcome: Not Progressing   Problem: Nutrition: Goal: Adequate nutrition will be maintained Outcome: Progressing   Problem: Coping: Goal: Level of anxiety will decrease Outcome: Progressing   Problem: Pain Managment: Goal: General experience of comfort will improve and/or be controlled Outcome: Progressing

## 2024-11-18 NOTE — TOC Initial Note (Signed)
 Transition of Care [facility]) - Initial/Assessment Note    Patient Details  Name: Christian Fields MRN: [id number] Date of Birth: 1956/04/21  [facility]) CM/SW Contact:    Corrie JINNY Ruts, LCSW Phone Number: 11/18/2024, 3:31 PM  Clinical Narrative:                 Chart reviewed. Patient is intubated. I was able to speak with the patient daughter via telephone. I introduced myself, my role, and reason for consult. The patient daughter confirms that the patient has a PCP. The patient daughter reports that the patient lives with her brother. The patient daughter reports that the patient will come stay with her when he is D/C.   The patient daughter reports that the patient needed assistance completing task. The patient daughter reports that she drove the patient to medical appointments. The patient daughter reports that the patient uses [facility]. The patient daughter reports that the patient has had HH with [facility] in the past and would be interested in [facility] services if recommended again.   The patient reports that the patient was admitted into [facility]. The patient daughter reports that she would not want SNF placement for the patient if recommended.   The patient daughter reports that the patient has a cane, walker, BSC, shower seat and wheel chair in the home.  The patient daughter had no ICM needs at this time.         Patient Goals and CMS Choice            Expected Discharge Plan and Services                                              Prior Living Arrangements/Services                       Activities of Daily Living   ADL Screening (condition at time of admission) Independently performs ADLs?: No Does the patient have a NEW difficulty with bathing/dressing/toileting/self-feeding that is expected to last >3 days?: No Does the patient have a NEW difficulty with getting in/out of bed, walking, or  climbing stairs that is expected to last >3 days?: No Does the patient have a NEW difficulty with communication that is expected to last >3 days?: No Is the patient deaf or have difficulty hearing?: No Does the patient have difficulty seeing, even when wearing glasses/contacts?: No Does the patient have difficulty concentrating, remembering, or making decisions?: No  Permission Sought/Granted                  Emotional Assessment              Admission diagnosis:  COPD exacerbation (HCC) [J44.1] Acute hypoxic on chronic hypercapnic respiratory failure (HCC) [J96.01, J96.12] Patient Active Problem List   Diagnosis Date Noted   Acute hypoxic on chronic hypercapnic respiratory failure (HCC) 11/12/2024   Acute on chronic combined systolic and diastolic CHF (congestive heart failure) (HCC) 11/03/2024   Acute on chronic combined systolic and diastolic congestive heart failure (HCC) 11/03/2024   CKD stage 3a, GFR 45-59 ml/min (HCC) 07/15/2024   Acute CHF (congestive heart failure) (HCC) 02/20/2024   Acute on chronic diastolic CHF (congestive heart failure) (HCC) 02/19/2024   Chronic respiratory failure with hypoxia (HCC) 02/19/2024  COPD exacerbation (HCC) 02/06/2024   Type II diabetes mellitus with renal manifestations (HCC) 02/06/2024   CKD (chronic kidney disease) stage 2, GFR 60-89 ml/min 02/06/2024   Anxiety 10/20/2023   Sinus tachycardia 10/20/2023   Acute respiratory failure with hypoxia and hypercapnia (HCC) 10/19/2023   Hypomagnesemia 10/18/2023   Tremor of both hands 10/18/2023   Acute hypoxic respiratory failure (HCC) 10/06/2023   Respiratory distress 10/04/2023   Anemia 10/04/2023   Erythematous rash 08/01/2023   Thrombocytopenia 08/01/2023   Zinc deficiency 08/01/2023   Decreased oral intake 07/27/2023   Elevated serum creatinine 07/27/2023   Acute osteomyelitis of left calcaneus (HCC) 07/11/2023   Diabetic infection of left foot (HCC) 07/11/2023   History of  osteomyelitis 07/10/2023   Cellulitis and abscess of toe of left foot 07/03/2023   PVD (peripheral vascular disease) 07/03/2023   Diabetic foot ulcer (HCC) 07/02/2023   HLD (hyperlipidemia) 07/02/2023   Depression with anxiety 07/02/2023   Elevated lactic acid level 07/02/2023   Abnormal LFTs 07/02/2023   COPD (chronic obstructive pulmonary disease) (HCC) 07/02/2023   Diarrhea 07/02/2023   Increased band cell count 04/18/2023   History of ankle fusion (Right) 12/31/2021   Chronic anticoagulation (Plavix ) 12/31/2021   Long term prescription benzodiazepine use (Klonopin ) 12/31/2021   DDD (degenerative disc disease), cervical 12/31/2021   Pharmacologic therapy 10/31/2021   Disorder of skeletal system 10/31/2021   Problems influencing health status 10/31/2021   Aspiration pneumonia (HCC)    Sepsis (HCC) 08/05/2021   Gout 08/05/2021   Depression 08/05/2021   Chronic heart failure with preserved ejection fraction (HFpEF) (HCC) 08/05/2021   HCAP (healthcare-associated pneumonia) 08/05/2021   Pain of left lower extremity 03/25/2021   Acute on chronic respiratory failure with hypoxia (HCC) 03/08/2021   Septic shock (HCC) 02/26/2021   Polysubstance (excluding opioids) dependence (HCC) 01/30/2021   Acute metabolic encephalopathy 01/29/2021   COPD with acute exacerbation (HCC) 01/29/2021   Iron  deficiency anemia 11/27/2020   Severe sepsis (HCC)    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   Left leg swelling 04/28/2020   Confusion    Acute respiratory failure with hypoxia (HCC) 03/26/2020   AKI (acute kidney injury) 01/13/2020   Cellulitis of left leg 01/12/2020   COVID-19 virus detected 01/12/2020   Diabetic ulcer of left midfoot associated with type 2 diabetes mellitus, with fat layer exposed (HCC) 01/12/2020   Panlobular emphysema (HCC) 01/12/2020   Arthritis 10/29/2019   Hemorrhoids 10/29/2019   Senile nuclear sclerosis, bilateral 02/15/2019   Cellulitis of left upper limb 02/11/2019    AAA (abdominal aortic aneurysm) without rupture 01/15/2019   Localized swelling, mass and lump, upper limb 12/24/2018   Pain in femur 12/24/2018   TIA (transient ischemic attack) 11/18/2018   Cortical age-related cataract of both eyes 11/02/2018   Fracture of multiple ribs 04/20/2018   Overweight (BMI 25.0-29.9) 04/20/2018   Hypertension 07/08/2017   Insulin  dependent type 2 diabetes mellitus (HCC) 07/08/2017   Tobacco use disorder 07/08/2017   Atherosclerosis of native arteries of extremity with intermittent claudication 07/08/2017   SOB (shortness of breath) 06/19/2017   Acute on chronic respiratory failure with hypoxia and hypercapnia (HCC) 12/15/2016   CAP (community acquired pneumonia) 12/15/2016   Chronic pain syndrome 12/15/2016   Chronically on opiate therapy 12/15/2016   History of kidney stones 12/15/2016   Polypharmacy 12/15/2016   Somnolence 12/15/2016   Foot ulcer (HCC) 01/19/2016   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) 11/29/2015   Other psychoactive substance use, unspecified with withdrawal,  uncomplicated (HCC) 11/29/2015   Type 2 diabetes mellitus (HCC) 11/27/2015   Chronic obstructive pulmonary disease, unspecified (HCC) 01/18/2013   Depressive disorder 01/18/2013   Hereditary and idiopathic peripheral neuropathy 01/18/2013   Sleep apnea 01/18/2013   Low back pain 11/23/2010   Acute gouty arthropathy 07/10/2010   Problem related to lifestyle 10/29/2004   PCP:  Lora Odor, FNP Pharmacy:   [facility] [place], [place] - [address] [place] [address] Phone: [phone] Fax: [phone]  [facility] [address] [place] [address] Phone: [phone] Fax: [phone]  [id number] - [place], [place] - [address] [place] [address] Phone: [phone] Fax: [phone]     Social Drivers of Health (SDOH) Social History: SDOH Screenings    Food Insecurity: No Food Insecurity (11/15/2024)  Housing: Low Risk (11/15/2024)  Transportation Needs: No Transportation Needs (11/15/2024)  Utilities: Not At Risk (11/15/2024)  Financial Resource Strain: Low Risk  (02/05/2024)   Received from [facility]  Social Connections: Socially Isolated (11/15/2024)  Tobacco Use: Medium Risk (11/12/2024)   SDOH Interventions:     Readmission Risk Interventions    11/18/2024    3:31 PM 02/09/2024   11:40 AM 10/09/2023   11:34 AM  Readmission Risk Prevention Plan  Transportation Screening Complete Complete   Medication Review Oceanographer) Complete Complete   PCP or Specialist appointment within 3-5 days of discharge Complete Complete   HRI or Home Care Consult Complete Complete   SW Recovery Care/Counseling Consult Complete Complete Complete  Palliative Care Screening Not Applicable Not Applicable   Skilled Nursing Facility Not Applicable Complete

## 2024-11-18 NOTE — Progress Notes (Signed)
 NAME:  Christian Fields, MRN:  [id number], DOB:  11-03-1956, LOS: 6 ADMISSION DATE:  11/12/2024, CONSULTATION DATE:  11/15/2024 REFERRING MD:  Dr. Awanda, CHIEF COMPLAINT:  Acute Respiratory Distress   Brief Pt Description / Synopsis:  68 y.o. male with PMHx significant for COPD and lung cancer admitted with Acute on Chronic Hypoxic and Hypercapnic Respiratory Failure due to Acute COPD Exacerbation due to Parainfluenza Infection requiring BiPAP.  Failed trial of BiPAP on 11/16/24 requiring intubation and mechanical ventilation.   History of Present Illness:  Christian Fields is a 68 year old male with a history of lifelong smoking and advanced COPD, had a diagnosis of stage I left upper lobe lung cancer after noticing an interval increase in the size of nodule from 1 cm to 1.4 in 2021. He went straight to SBRT due to being a poor surgical candidate for biopsy. He also is having recurrent COPD exacerbations and having rehospitalization for this. He has chronic hypoxemia and uses oxygen  at home up to 5 L/min. He has chronic physical deconditioning. He has chronic peripheral edema and also follows up with cardiology for essential hypertension and dyslipidemia. He also has a background history of CKD 3 A, sleep apnea noncompliant with CPAP therapy, polysubstance abuse and abdominal aortic aneurysm. He came to the hospital on this admission with complaints of worsening shortness of breath x 1 day was noted to have oxygen  saturation approximately 60% on his usual 4 L/min nasal cannula he denies sick contacts or fevers at home. He had a venous blood gas performed in the ED which showed mild acidemia with hypercapnia. He was treated with nebulizer therapy and steroids as well as BiPAP and reports slight improvement since then. He is started on empiric antibiotics with cefepime  and vancomycin  for possible pneumonia. PCCM consultation placed due to recurrent hospitalization with advanced COPD, chronic hypoxemia and lung cancer.     Please see Significant Hospital Events section below for full detailed hospital course.   Pertinent  Medical History   Past Medical History:  Diagnosis Date   Anemia    Anxiety    Arthritis    Asthma    Cancer (HCC)    Basal Cell Skin Cancer   Chronic back pain    COPD (chronic obstructive pulmonary disease) (HCC)    Depression    Diabetes mellitus (HCC)    Dyspnea    GERD (gastroesophageal reflux disease)    Gout    Gout    Headache    History of blood clots    Left Leg--July 2018   History of kidney stones    Hyperlipidemia    Hyperlipidemia    Hypertension    Kidney stones    Neuropathy    On home oxygen  therapy    2 L / M   Pneumonia 06/2017   Sleep apnea    Ulcer of foot (HCC)    Right    Micro Data:  12/5: COVID/FLU/RSV PCR>> negative 12/5: RVP >> + Parainfluenza  12/5: MRSA PCR>> negative  12/9: Tracheal aspirate>> + enterococcus faecalis 12/9: Strep pneumo urinary antigen>>negative 12/9: Legionella urinary antigen>>  Antimicrobials:   Anti-infectives (From admission, onward)    Start     Dose/Rate Route Frequency Ordered Stop   11/16/24 1200  piperacillin -tazobactam (ZOSYN ) IVPB 3.375 g        3.375 g 12.5 mL/hr over 240 Minutes Intravenous Every 8 hours 11/16/24 1110     11/15/24 1215  azithromycin  (ZITHROMAX ) 500 mg in sodium chloride  0.9 %  250 mL IVPB        500 mg 250 mL/hr over 60 Minutes Intravenous Every 24 hours 11/15/24 1129     11/15/24 1215  cefTRIAXone  (ROCEPHIN ) 2 g in sodium chloride  0.9 % 100 mL IVPB  Status:  Discontinued        2 g 200 mL/hr over 30 Minutes Intravenous Every 24 hours 11/15/24 1129 11/16/24 1030   11/13/24 1900  vancomycin  (VANCOREADY) IVPB 1250 mg/250 mL  Status:  Discontinued        1,250 mg 166.7 mL/hr over 90 Minutes Intravenous Every 24 hours 11/12/24 1744 11/12/24 1749   11/13/24 1000  azithromycin  (ZITHROMAX ) tablet 250 mg  Status:  Discontinued        250 mg Oral Daily 11/13/24 0819 11/15/24 1129    11/13/24 0500  piperacillin -tazobactam (ZOSYN ) IVPB 3.375 g  Status:  Discontinued        3.375 g 12.5 mL/hr over 240 Minutes Intravenous Every 8 hours 11/12/24 2112 11/13/24 0814   11/12/24 1900  vancomycin  (VANCOREADY) IVPB 1750 mg/350 mL  Status:  Discontinued        1,750 mg 175 mL/hr over 120 Minutes Intravenous  Once 11/12/24 1744 11/12/24 1749   11/12/24 1700  ceFEPIme  (MAXIPIME ) 2 g in sodium chloride  0.9 % 100 mL IVPB  Status:  Discontinued        2 g 200 mL/hr over 30 Minutes Intravenous Every 12 hours 11/12/24 1615 11/12/24 1745       Significant Hospital Events: Including procedures, antibiotic start and stop dates in addition to other pertinent events   12/5: Admitted by [facility].  Pulmonary consulted for severe AECOPD requiring BiPAP. 11/14/24-patient with Parainfluenza induced AECOPD. He was moved to Step down unit due to progressive dyspnea and is on BIPAP. He is on solumedrol BID and nebulizer therapy. Will continue to follow. Anticipate slow improvement over next 3-5 weeks with this virus. Hopefully dc in 3-4days.  11/15/24: Mid morning pt with increased WOB and accessory muscle use.  PCCM consulted.  Given 40 mg Lasix  by [name].  Increase duonebs to q4h and start Ceftriaxone  and increase Azithromycin  dose to 500 mg.  High risk for intubation.  11/16/24: Acutely decompensated, became severely hypoxic (sats 60's) with increased WOB, required intubation and mechanical ventilation. Given acute decompensation and persistent sinus tachycardia, CTa obtained which is negative for PE, does show possible superimposed bacterial pneumonia.  Ceftriaxone  broadened to Zosyn . Telemetry alarming for ST elevation, obtained EKG and reviewed with Dr. Mady of Cardiology, felt not to be STEMI, obtaining Troponin and Echo. 11/17/24: No acute events overnight. Cr worsening at 1.41 this am. Remains on Zosyn . Awake and alert this am, transitioned to SBT but failed quickly. Became tachycardic and hypertensive.  Restarted sedation.  11/18/24: No acute overnight events. Remains critically ill requiring mechanical ventilation. Will perform another SBT today with precedex .   Interim History / Subjective:  As outlined above under Significant Hospital Events section  Objective   Blood pressure 102/65, pulse (!) 103, temperature 98.6 F (37 C), temperature source Axillary, resp. rate 13, height 5' 6 (1.676 m), weight 71.3 kg, SpO2 96%.    Vent Mode: PCV FiO2 (%):  [30 %-50 %] 50 % Set Rate:  [16 bmp] 16 bmp Vt Set:  [450 mL] 450 mL PEEP:  [5 cmH20] 5 cmH20 Plateau Pressure:  [14 cmH20-18 cmH20] 14 cmH20   Intake/Output Summary (Last 24 hours) at 11/18/2024 0840 Last data filed at 11/18/2024 0700 Gross per 24 hour  Intake  2636.72 ml  Output 1480 ml  Net 1156.72 ml   Filed Weights   11/14/24 1818 11/17/24 0500 11/18/24 0400  Weight: 75.8 kg 72.5 kg 71.3 kg    Examination: General: Acute on chronically ill appearing male, intubated and sedated, in NAD HENT: Atraumatic, normocephalic, neck supple, no JVD Lungs: mildly coarse breath sounds throughout (R>L) with expiratory wheezing, synchronous with vent  Cardiovascular: Tachycardia, Regular rhythm, s1s2, no M/R/G Abdomen: Soft, nontender, nondistended, no rebound/guarding, bowel sounds x 4 Extremities: Normal bulk and tone, no deformities, warm and well perfused, no edema Neuro: intubated and sedated but follows commands, wakes up easily, no focal neuro deficits, PERRL  GU: Foley catheter in place draining yellow urine   Resolved Hospital Problem list     Assessment & Plan:   #Sedation needs in setting of mechanical ventilation #Anxiety Hx: Depression. Anxiety, sleep apnea - Maintain a RASS goal of 0 to -1 - Fentanyl  & Propofol  to maintain RASS goal - Will add Precedex  today for SBT and try to wean propofol  and fentanyl  - Wean sedating medications as able - Control pain as able - Daily WUA  #Acute on chronic Hypoxic &  Hypercapnic Respiratory Failure #Acute COPD Exacerbation due to Parainfluenza Viral Infection #Superimposed Bacterial Pneumonia (+ Enterococcus Faecalis) #Lung Cancer Hx: COPD on home oxygen , Asthma  CTa Chest PE 12/9: negative for PE, new multifocal airspace consolidation in RLL, RML, and LLL, stable 2.6 cm pleural based mass like consolidation in lateral LUL, enlarged central pulmonary arteries suggesting pulmonary artery hypertension  - Full vent support - Implement lung protective strategies - Plateau pressures less than 30 cm H20 - Wean FiO2 & PEEP as tolerated to maintain O2 sats 88 to 92% - Follow intermittent Chest X-ray & ABG as needed - SBT when respiratory parameters met and mental status permits - Implement VAP Bundle - Bronchodilators & Pulmicort  nebs  - Continue IV Solumedrol 40 mg BID - Azithromycin  and Zosyn  - Adding Vancomycin  to cover for + Enterococcus Faecalis - Diuresis as BP and renal function permits ~ Hold lasix   - Pulmonary toilet as able  #Tachycardia, likely compensatory in nature d/t respiratory failure #Mildly Elevated Troponin, suspect demand ischemia  Hx: HTN, HLD 12/9: Telemetry alarming ST elevation, EKG with ST elevation in inferior leads (but no reciprocal changes), reviewed with Dr. Mady, felt not to be a STEMI 11/17/24 ECHO: LVEF 60-65%, mild LVH, severely elevated pulmonary artery pressure with mildly reduced RV systolic function; moderate thickening of aortic valve.  - Continuous cardiac monitoring - Gentle IV fluids - Vasopressors to maintain MAP goal >65 ~ stopped Levophed  this am - Continue clopidogrel  and ezetimibe  - Trend HS Troponin: 32 > 32  #Mild AKI - Trend BMP and monitor renal function - Creatinine this am 1.39 - Strict I/O - Avoid nephrotoxins as able - Ensure adequate renal perfusion - ICU electrolyte replacement protocol - Pharmacy to assist with replacement as indicated  #GI Prophylaxis #GERD - Continue Protonix  40mg  -  Constipation protocol prn - Antiemetics prn - Diet: Tube feeds  #Type II Diabetes Mellitus - ICU hypo/hyperglycemia protocol - SSI - Goal Range 140-180 - CBG Q4h  #Parainfluenza Viral Infection #Superimposed bacterial pneumonia  #Enterococcus Faecalis - Trend WBC and monitor fever curve - WBC trending down - Tylenol  prn for fever - Procalcitonin 0.20 - Follow cultures as above - Continue empiric Azithromycin  for AECOPD - Broadened to Zosyn  12/9  - Narrow ABX following culture and sensitivities - Tracheal aspirate: + Enterococcus faecalis - Adding  Vancomycin     Best Practice (right click and Reselect all SmartList Selections daily)   Diet/type: tube feeds DVT prophylaxis: LMWH GI prophylaxis: PPI Lines: N/A Foley:  yes, and still needed  Code Status:  full code Last date of multidisciplinary goals of care discussion [12/11]  12/11: Will update pt's family when they arrive at bedside.    Labs   CBC: Recent Labs  Lab 11/14/24 0459 11/15/24 0636 11/16/24 0310 11/17/24 0315 11/18/24 0332  WBC 24.0* 31.7* 26.2* 23.5* 19.9*  HGB 13.1 13.7 14.8 12.6* 11.3*  HCT 44.8 45.6 50.5 41.5 37.1*  MCV 98.5 95.4 96.4 92.6 93.5  PLT 274 254 221 219 178    Basic Metabolic Panel: Recent Labs  Lab 11/14/24 0459 11/15/24 0636 11/16/24 0310 11/17/24 0315 11/18/24 0332  NA 137 143 145 139 139  K 4.7 4.7 4.8 4.4 4.3  CL 99 97* 94* 95* 97*  CO2 26 37* 39* 34* 34*  GLUCOSE 219* 171* 79 193* 174*  BUN 34* 40* 44* 47* 57*  CREATININE 1.18 1.17 1.20 1.41* 1.39*  CALCIUM  8.6* 9.5 9.4 8.7* 8.7*  MG 2.4 2.0 2.1 2.0 2.1  PHOS  --  3.1 3.4 2.9 3.0   GFR: Estimated Creatinine Clearance: 45.3 mL/min (A) (by C-G formula based on SCr of 1.39 mg/dL (H)). Recent Labs  Lab 11/13/24 1106 11/14/24 0459 11/14/24 1704 11/15/24 0636 11/15/24 1237 11/16/24 0310 11/17/24 0315 11/18/24 0332  PROCALCITON 0.23  --  0.18  --  0.20  --   --   --   WBC  --    < >  --  31.7*  --  26.2*  23.5* 19.9*   < > = values in this interval not displayed.    Liver Function Tests: Recent Labs  Lab 11/12/24 1434 11/13/24 0017  AST 19 18  ALT 26 21  ALKPHOS 113 102  BILITOT 0.3 <0.2  PROT 7.1 6.3*  ALBUMIN  4.2 3.7   No results for input(s): LIPASE, AMYLASE in the last 168 hours. No results for input(s): AMMONIA in the last 168 hours.  ABG    Component Value Date/Time   PHART 7.44 11/16/2024 0721   PCO2ART 67 (HH) 11/16/2024 0721   PO2ART 322 (H) 11/16/2024 0721   HCO3 39.3 (H) 11/17/2024 1757   ACIDBASEDEF 1.4 11/03/2024 1854   O2SAT 96.5 11/17/2024 1757     Coagulation Profile: Recent Labs  Lab 11/13/24 0017  INR 1.0    Cardiac Enzymes: No results for input(s): CKTOTAL, CKMB, CKMBINDEX, TROPONINI in the last 168 hours.  HbA1C: Hemoglobin A1C  Date/Time Value Ref Range Status  09/25/2013 03:56 AM 6.3 4.2 - 6.3 % Final    Comment:    The American Diabetes Association recommends that a primary goal of therapy should be <7% and that physicians should reevaluate the treatment regimen in patients with HbA1c values consistently >8%.    Hgb A1c MFr Bld  Date/Time Value Ref Range Status  07/16/2024 03:19 AM 6.0 (H) 4.8 - 5.6 % Final    Comment:    (NOTE)         Prediabetes: 5.7 - 6.4         Diabetes: >6.4         Glycemic control for adults with diabetes: <7.0   10/17/2023 04:51 AM 5.7 (H) 4.8 - 5.6 % Final    Comment:    (NOTE) Pre diabetes:          5.7%-6.4%  Diabetes:              >  6.4%  Glycemic control for   <7.0% adults with diabetes     CBG: Recent Labs  Lab 11/17/24 1520 11/17/24 1917 11/17/24 2312 11/18/24 0321 11/18/24 0718  GLUCAP 163* 227* 201* 180* 160*    Review of Systems:   Unable to assess due to intubation/sedation/critical illness   Past Medical History:  He,  has a past medical history of Anemia, Anxiety, Arthritis, Asthma, Cancer (HCC), Chronic back pain, COPD (chronic obstructive pulmonary  disease) (HCC), Depression, Diabetes mellitus (HCC), Dyspnea, GERD (gastroesophageal reflux disease), Gout, Gout, Headache, History of blood clots, History of kidney stones, Hyperlipidemia, Hyperlipidemia, Hypertension, Kidney stones, Neuropathy, On home oxygen  therapy, Pneumonia (06/2017), Sleep apnea, and Ulcer of foot (HCC).   Surgical History:   Past Surgical History:  Procedure Laterality Date   APPENDECTOMY     DG FEET 2 VIEWS BILAT     IRRIGATION AND DEBRIDEMENT FOOT Left 07/11/2023   Procedure: IRRIGATION AND DEBRIDEMENT FOOT;  Surgeon: Ashley Soulier, DPM;  Location: [facility] ORS;  Service: Orthopedics/Podiatry;  Laterality: Left;   LIPOMA EXCISION Right 08/15/2017   Procedure: EXCISION TUMOR(CYST) FOOT;  Surgeon: Lilli Cough, DPM;  Location: [facility] ORS;  Service: Podiatry;  Laterality: Right;   LOWER EXTREMITY ANGIOGRAPHY Left 07/03/2023   Procedure: Lower Extremity Angiography;  Surgeon: Marea Selinda RAMAN, MD;  Location: [facility] INVASIVE [facility] LAB;  Service: Cardiovascular;  Laterality: Left;   OTHER SURGICAL HISTORY Bilateral Foot surgery     Social History:   reports that he has quit smoking. His smoking use included cigarettes. He started smoking about 7 years ago. He has a 4 pack-year smoking history. He has never used smokeless tobacco. He reports current alcohol  use of about 1.0 standard drink of alcohol  per week. He reports that he does not use drugs.   Family History:  His family history includes Hypertension in his mother.   Allergies Allergies  Allergen Reactions   Bee Venom Anaphylaxis   Linezolid  Other (See Comments)    Thrombocytopenia, hyperlactatemia   Azithromycin  Itching and Rash     Home Medications  Prior to Admission medications   Medication Sig Start Date End Date Taking? Authorizing Provider  acetaminophen  (TYLENOL ) 325 MG tablet Take 650 mg by mouth every 6 (six) hours as needed.   Yes [provider]  albuterol  (VENTOLIN  HFA) 108 (90 Base) MCG/ACT  inhaler Inhale 2 puffs into the lungs every 4 (four) hours as needed for wheezing or shortness of breath.   Yes [provider]  allopurinol  (ZYLOPRIM ) 300 MG tablet Take 600 mg by mouth daily. 02/24/20  Yes [provider]  aspirin  EC 81 MG tablet Take 1 tablet by mouth daily.   Yes [provider]  azelastine  (ASTELIN ) 0.1 % nasal spray Place 2 sprays into both nostrils 2 (two) times daily as needed. 05/27/23  Yes [provider]  Buprenorphine  HCl-Naloxone  HCl 8-2 MG FILM Place 1 Film under the tongue in the morning, at noon, and at bedtime.   Yes [provider]  clopidogrel  (PLAVIX ) 75 MG tablet Take 1 tablet (75 mg total) by mouth daily. 02/02/21  Yes Leotis Bogus, MD  dapagliflozin  propanediol (FARXIGA ) 10 MG TABS tablet Take 1 tablet by mouth daily. 03/16/24 03/16/25 Yes [provider]  DULoxetine  (CYMBALTA ) 60 MG capsule Take 60 mg by mouth daily.   Yes [provider]  ezetimibe  (ZETIA ) 10 MG tablet Take 10 mg by mouth daily. 02/24/20  Yes [provider]  fluticasone  (FLONASE ) 50 MCG/ACT nasal spray Place 1  spray into both nostrils daily as needed for allergies. 10/16/18  Yes [provider]  Fluticasone -Umeclidin-Vilant (TRELEGY ELLIPTA) 100-62.5-25 MCG/ACT AEPB Inhale 1 puff into the lungs in the morning. 09/12/21  Yes [provider]  furosemide  (LASIX ) 20 MG tablet Take 1 tablet (20 mg total) by mouth daily. 02/23/24  Yes Jens Durand, MD  gabapentin  (NEURONTIN ) 800 MG tablet Take 800 mg by mouth 4 (four) times daily. 10/22/23  Yes [provider]  ipratropium-albuterol  (DUONEB) 0.5-2.5 (3) MG/3ML SOLN Take 3 mLs by nebulization every 6 (six) hours as needed (wheezing).   Yes [provider]  metFORMIN  (GLUCOPHAGE ) 1000 MG tablet Take 1,000 mg by mouth daily.   Yes [provider]  metoprolol  succinate (TOPROL -XL) 50 MG 24 hr tablet Take 50 mg by mouth daily. 02/24/20  Yes  [provider]  Multiple Vitamins-Minerals (MULTIVITAMIN ADULT, MINERALS, PO) Take 1 tablet by mouth every morning.   Yes [provider]  naloxone  (NARCAN ) nasal spray 4 mg/0.1 mL Place 1 spray into the nose once.   Yes [provider]  OHTUVAYRE  3 MG/2.5ML SUSP Take 5 mLs by mouth daily. 07/05/24  Yes [provider]  ondansetron  (ZOFRAN -ODT) 4 MG disintegrating tablet Take 4 mg by mouth every 8 (eight) hours as needed for nausea.   Yes [provider]  OXYGEN  Inhale 4 L into the lungs.   Yes [provider]  predniSONE  (DELTASONE ) 20 MG tablet 60mg  daily x 2 days, 40mg  daily x 2 days, 20mg  daily x 2 days then stop. 11/07/24  Yes Trudy Anthony HERO, MD  roflumilast  (DALIRESP ) 500 MCG TABS tablet Take 500 mcg by mouth daily. 08/15/21  Yes [provider]  simvastatin  (ZOCOR ) 40 MG tablet Take 40 mg by mouth at bedtime. 01/17/16  Yes [provider]  Syringe/Needle, Disp, (SYRINGE 3CC/25GX1) 25G X 1 3 ML MISC 1 Syringe by Does not apply route every 30 (thirty) days. 07/12/22  Yes Brahmanday, Govinda R, MD  traZODone  (DESYREL ) 50 MG tablet Take 50 mg by mouth at bedtime as needed for sleep. 01/22/24 01/21/25 Yes [provider]  bisacodyl  (DULCOLAX) 5 MG EC tablet Take 1 tablet (5 mg total) by mouth daily as needed for moderate constipation. Patient not taking: Reported on 11/03/2024 09/04/23   Dorinda Drue DASEN, MD     Critical care time: 45 minutes    Robet Kim, PA-C [facility] PCCM Team Contact Info: [phone]

## 2024-11-19 ENCOUNTER — Inpatient Hospital Stay

## 2024-11-19 DIAGNOSIS — J441 Chronic obstructive pulmonary disease with (acute) exacerbation: Secondary | ICD-10-CM | POA: Diagnosis not present

## 2024-11-19 DIAGNOSIS — J9621 Acute and chronic respiratory failure with hypoxia: Secondary | ICD-10-CM | POA: Diagnosis not present

## 2024-11-19 DIAGNOSIS — J9622 Acute and chronic respiratory failure with hypercapnia: Secondary | ICD-10-CM | POA: Diagnosis not present

## 2024-11-19 DIAGNOSIS — C3492 Malignant neoplasm of unspecified part of left bronchus or lung: Secondary | ICD-10-CM | POA: Diagnosis not present

## 2024-11-19 LAB — CBC
HCT: 34.1 % — ABNORMAL LOW (ref 39.0–52.0)
Hemoglobin: 10.4 g/dL — ABNORMAL LOW (ref 13.0–17.0)
MCH: 28.4 pg (ref 26.0–34.0)
MCHC: 30.5 g/dL (ref 30.0–36.0)
MCV: 93.2 fL (ref 80.0–100.0)
Platelets: 163 K/uL (ref 150–400)
RBC: 3.66 MIL/uL — ABNORMAL LOW (ref 4.22–5.81)
RDW: 15.7 % — ABNORMAL HIGH (ref 11.5–15.5)
WBC: 15.1 K/uL — ABNORMAL HIGH (ref 4.0–10.5)
nRBC: 0 % (ref 0.0–0.2)

## 2024-11-19 LAB — BASIC METABOLIC PANEL WITH GFR
Anion gap: 8 (ref 5–15)
BUN: 59 mg/dL — ABNORMAL HIGH (ref 8–23)
CO2: 32 mmol/L (ref 22–32)
Calcium: 8.8 mg/dL — ABNORMAL LOW (ref 8.9–10.3)
Chloride: 100 mmol/L (ref 98–111)
Creatinine, Ser: 1.28 mg/dL — ABNORMAL HIGH (ref 0.61–1.24)
GFR, Estimated: >60 mL/min (ref >60)
Glucose, Bld: 201 mg/dL — ABNORMAL HIGH (ref 70–99)
Potassium: 4.5 mmol/L (ref 3.5–5.1)
Sodium: 140 mmol/L (ref 135–145)

## 2024-11-19 LAB — CULTURE, RESPIRATORY W GRAM STAIN

## 2024-11-19 LAB — GLUCOSE, CAPILLARY
Glucose-Capillary: 101 mg/dL — ABNORMAL HIGH (ref 70–99)
Glucose-Capillary: 125 mg/dL — ABNORMAL HIGH (ref 70–99)
Glucose-Capillary: 127 mg/dL — ABNORMAL HIGH (ref 70–99)
Glucose-Capillary: 179 mg/dL — ABNORMAL HIGH (ref 70–99)
Glucose-Capillary: 245 mg/dL — ABNORMAL HIGH (ref 70–99)
Glucose-Capillary: 250 mg/dL — ABNORMAL HIGH (ref 70–99)

## 2024-11-19 LAB — MAGNESIUM: Magnesium: 2.2 mg/dL (ref 1.7–2.4)

## 2024-11-19 LAB — PHOSPHORUS: Phosphorus: 3 mg/dL (ref 2.5–4.6)

## 2024-11-19 MED ORDER — QUETIAPINE FUMARATE 25 MG PO TABS
25.0000 mg | ORAL_TABLET | Freq: Two times a day (BID) | ORAL | Status: DC
  Administered 2024-11-19 – 2024-11-21 (×4): 25 mg
  Filled 2024-11-19 (×4): qty 1

## 2024-11-19 MED ORDER — HYDROMORPHONE BOLUS VIA INFUSION
0.2500 mg | INTRAVENOUS | Status: DC | PRN
  Administered 2024-11-19: 1.25 mg via INTRAVENOUS
  Administered 2024-11-19: 2 mg via INTRAVENOUS
  Administered 2024-11-19: 1 mg via INTRAVENOUS
  Administered 2024-11-19: 2 mg via INTRAVENOUS
  Administered 2024-11-19: 1.25 mg via INTRAVENOUS
  Administered 2024-11-19: 1 mg via INTRAVENOUS
  Administered 2024-11-19: 2 mg via INTRAVENOUS
  Administered 2024-11-19: 1 mg via INTRAVENOUS
  Administered 2024-11-20: 1.5 mg via INTRAVENOUS
  Administered 2024-11-20: 1 mg via INTRAVENOUS
  Administered 2024-11-20: 2 mg via INTRAVENOUS
  Administered 2024-11-21: 1 mg via INTRAVENOUS
  Administered 2024-11-23 (×2): 2 mg via INTRAVENOUS

## 2024-11-19 MED ORDER — MIDAZOLAM HCL (PF) 2 MG/2ML IJ SOLN: 2.0000 mg | Freq: Once | INTRAMUSCULAR | Status: AC

## 2024-11-19 MED ORDER — METHYLPREDNISOLONE SODIUM SUCC 40 MG IJ SOLR
40.0000 mg | INTRAMUSCULAR | Status: DC
  Administered 2024-11-20 – 2024-11-23 (×4): 40 mg via INTRAVENOUS
  Filled 2024-11-19 (×4): qty 1

## 2024-11-19 MED ORDER — MIDAZOLAM HCL (PF) 2 MG/2ML IJ SOLN
2.0000 mg | Freq: Once | INTRAMUSCULAR | Status: AC
  Administered 2024-11-19: 2 mg via INTRAVENOUS
  Filled 2024-11-19: qty 2

## 2024-11-19 MED ORDER — HYDROMORPHONE HCL 1 MG/ML IJ SOLN
1.0000 mg | Freq: Once | INTRAMUSCULAR | Status: AC
  Administered 2024-11-19: 1 mg via INTRAVENOUS

## 2024-11-19 MED ORDER — MIDAZOLAM HCL (PF) 2 MG/2ML IJ SOLN
2.0000 mg | INTRAMUSCULAR | Status: DC | PRN
  Administered 2024-11-19 – 2024-11-20 (×3): 2 mg via INTRAVENOUS
  Filled 2024-11-19 (×3): qty 2

## 2024-11-19 MED ORDER — HYDROMORPHONE HCL-NACL 50-0.9 MG/50ML-% IV SOLN
0.5000 mg/h | INTRAVENOUS | Status: DC
  Administered 2024-11-19: 1 mg/h via INTRAVENOUS
  Administered 2024-11-19: 1.5 mg/h via INTRAVENOUS
  Administered 2024-11-20 – 2024-11-24 (×6): 3 mg/h via INTRAVENOUS
  Administered 2024-11-25: 11:00:00 1.5 mg/h via INTRAVENOUS
  Filled 2024-11-19 (×9): qty 50

## 2024-11-19 MED ORDER — VECURONIUM BROMIDE 10 MG IV SOLR
10.0000 mg | Freq: Once | INTRAVENOUS | Status: AC
  Administered 2024-11-19: 10 mg via INTRAVENOUS
  Filled 2024-11-19: qty 10

## 2024-11-19 MED ORDER — NOREPINEPHRINE 4 MG/250ML-% IV SOLN
0.0000 ug/min | INTRAVENOUS | Status: DC
  Administered 2024-11-19: 2 ug/min via INTRAVENOUS
  Filled 2024-11-19: qty 250

## 2024-11-19 MED ORDER — MIDAZOLAM HCL 2 MG/2ML IJ SOLN
INTRAMUSCULAR | Status: AC
  Administered 2024-11-19: 2 mg via INTRAVENOUS
  Filled 2024-11-19: qty 2

## 2024-11-19 NOTE — Inpatient Diabetes Management (Signed)
 Inpatient Diabetes Program Recommendations  AACE/ADA: New Consensus Statement on Inpatient Glycemic Control   Target Ranges:  Prepandial:   less than 140 mg/dL      Peak postprandial:   less than 180 mg/dL (1-2 hours)      Critically ill patients:  140 - 180 mg/dL    Latest Reference Range & Units 11/18/24 07:18 11/18/24 11:09 11/18/24 15:47 11/18/24 19:16 11/18/24 23:24 11/19/24 03:06 11/19/24 07:34  Glucose-Capillary 70 - 99 mg/dL 839 (H) 755 (H) 808 (H) 162 (H) 196 (H) 179 (H) 245 (H)   Review of Glycemic Control  Diabetes history: DM2 Outpatient Diabetes medications: Metformin  1000 mg daily Current orders for Inpatient glycemic control: Novolog  0-15 units Q4H; Solumedrol 40 mg Q12H, Vital @ 60 ml/hr  Inpatient Diabetes Program Recommendations:    Insulin : Please consider ordering Novolog  3 units Q4H for tube feeding coverage. If tube feeding is stopped or held then Novolog  tube feeding coverage should also be stopped or held.  Thanks, Earnie Gainer, RN, MSN, CDCES Diabetes Coordinator Inpatient Diabetes Program [phone] (Team Pager from 8am to 5pm)

## 2024-11-19 NOTE — Plan of Care (Signed)
°  Problem: Education: Goal: Knowledge of General Education information will improve Description: Including pain rating scale, medication(s)/side effects and non-pharmacologic comfort measures Outcome: Not Progressing   Problem: Health Behavior/Discharge Planning: Goal: Ability to manage health-related needs will improve Outcome: Not Progressing   Problem: Clinical Measurements: Goal: Ability to maintain clinical measurements within normal limits will improve Outcome: Progressing Goal: Diagnostic test results will improve Outcome: Progressing Goal: Respiratory complications will improve Outcome: Not Progressing Goal: Cardiovascular complication will be avoided Outcome: Progressing   Problem: Nutrition: Goal: Adequate nutrition will be maintained Outcome: Progressing   Problem: Coping: Goal: Level of anxiety will decrease Outcome: Progressing   Problem: Elimination: Goal: Will not experience complications related to bowel motility Outcome: Progressing   Problem: Pain Managment: Goal: General experience of comfort will improve and/or be controlled Outcome: Progressing

## 2024-11-19 NOTE — Progress Notes (Signed)
 PHARMACY CONSULT NOTE - FOLLOW UP  Pharmacy Consult for Electrolyte Monitoring and Replacement   Recent Labs: Potassium (mmol/L)  Date Value  11/19/2024 4.5  05/17/2014 3.5   Magnesium  (mg/dL)  Date Value  87/87/7974 2.2   Calcium  (mg/dL)  Date Value  87/87/7974 8.8 (L)   Calcium , Total (mg/dL)  Date Value  95/76/7984 9.3   Albumin  (g/dL)  Date Value  87/93/7974 3.7  09/24/2013 3.6   Phosphorus (mg/dL)  Date Value  87/87/7974 3.0   Sodium (mmol/L)  Date Value  11/19/2024 140  03/31/2014 136    Assessment:  68 y.o. male admitted on 11/12/2024 with sepsis and acute hypoxic on chronic hypercarbic respiratory failure. PMH significant for COPD on home oxygen  4 L, non-small cell lung cancer s/p RXT, OSA on CPAP, GERD, history of TIA, asthma, diabetes type 2, HLD, HTN, and CKD-3a. Patient was also recently admitted from 11/26-11/30 for COPD exacerbation. Pharmacy has been consulted for replacing electrolytes  Nutrition: Vital AF at 30 mL/hr + FWF 30 mL every 4h  Goal of Therapy:  Electrolytes WNL  Plan:  ---no electrolyte replacement warranted for today ---recheck electrolytes in am  [name] ,PharmD Clinical Pharmacist 11/19/2024 7:32 AM

## 2024-11-19 NOTE — Progress Notes (Signed)
 NAME:  Christian Fields, MRN:  [id number], DOB:  [birth year], LOS: 7 ADMISSION DATE:  11/12/2024, CONSULTATION DATE:  11/15/2024 REFERRING MD:  Dr. Awanda, CHIEF COMPLAINT:  Acute Respiratory Distress   Brief Pt Description / Synopsis:  68 y.o. male with PMHx significant for COPD and lung cancer admitted with Acute on Chronic Hypoxic and Hypercapnic Respiratory Failure due to Acute COPD Exacerbation due to Parainfluenza Infection requiring BiPAP.  Failed trial of BiPAP on 11/16/24 requiring intubation and mechanical ventilation, found to have superimposed Enterococcus Faecalis bacterial pneumonia.   History of Present Illness:  Christian Fields is a 68 year old male with a history of lifelong smoking and advanced COPD, had a diagnosis of stage I left upper lobe lung cancer after noticing an interval increase in the size of nodule from 1 cm to 1.4 in 2021. He went straight to SBRT due to being a poor surgical candidate for biopsy. He also is having recurrent COPD exacerbations and having rehospitalization for this. He has chronic hypoxemia and uses oxygen  at home up to 5 L/min. He has chronic physical deconditioning. He has chronic peripheral edema and also follows up with cardiology for essential hypertension and dyslipidemia. He also has a background history of CKD 3 A, sleep apnea noncompliant with CPAP therapy, polysubstance abuse and abdominal aortic aneurysm. He came to the hospital on this admission with complaints of worsening shortness of breath x 1 day was noted to have oxygen  saturation approximately 60% on his usual 4 L/min nasal cannula he denies sick contacts or fevers at home. He had a venous blood gas performed in the ED which showed mild acidemia with hypercapnia. He was treated with nebulizer therapy and steroids as well as BiPAP and reports slight improvement since then. He is started on empiric antibiotics with cefepime  and vancomycin  for possible pneumonia. PCCM consultation placed due to recurrent  hospitalization with advanced COPD, chronic hypoxemia and lung cancer.    Please see Significant Hospital Events section below for full detailed hospital course.   Pertinent  Medical History   Past Medical History:  Diagnosis Date   Anemia    Anxiety    Arthritis    Asthma    Cancer (HCC)    Basal Cell Skin Cancer   Chronic back pain    COPD (chronic obstructive pulmonary disease) (HCC)    Depression    Diabetes mellitus (HCC)    Dyspnea    GERD (gastroesophageal reflux disease)    Gout    Gout    Headache    History of blood clots    Left Leg--July 2018   History of kidney stones    Hyperlipidemia    Hyperlipidemia    Hypertension    Kidney stones    Neuropathy    On home oxygen  therapy    2 L / M   Pneumonia 06/2017   Sleep apnea    Ulcer of foot (HCC)    Right    Micro Data:  12/5: COVID/FLU/RSV PCR>> negative 12/5: RVP >> + Parainfluenza  12/5: MRSA PCR>> negative  12/9: Tracheal aspirate>> + enterococcus faecalis 12/9: Strep pneumo urinary antigen>>negative 12/9: Legionella urinary antigen>>negative  Antimicrobials:   Anti-infectives (From admission, onward)    Start     Dose/Rate Route Frequency Ordered Stop   11/19/24 0800  vancomycin  (VANCOCIN ) IVPB 1000 mg/200 mL premix        1,000 mg 200 mL/hr over 60 Minutes Intravenous Every 24 hours 11/18/24 1253     11/18/24 1300  vancomycin  (VANCOREADY) IVPB 1250 mg/250 mL        1,250 mg 166.7 mL/hr over 90 Minutes Intravenous  Once 11/18/24 1129 11/18/24 1333   11/16/24 1200  piperacillin -tazobactam (ZOSYN ) IVPB 3.375 g        3.375 g 12.5 mL/hr over 240 Minutes Intravenous Every 8 hours 11/16/24 1110     11/15/24 1215  azithromycin  (ZITHROMAX ) 500 mg in sodium chloride  0.9 % 250 mL IVPB  Status:  Discontinued        500 mg 250 mL/hr over 60 Minutes Intravenous Every 24 hours 11/15/24 1129 11/18/24 1016   11/15/24 1215  cefTRIAXone  (ROCEPHIN ) 2 g in sodium chloride  0.9 % 100 mL IVPB  Status:   Discontinued        2 g 200 mL/hr over 30 Minutes Intravenous Every 24 hours 11/15/24 1129 11/16/24 1030   11/13/24 1900  vancomycin  (VANCOREADY) IVPB 1250 mg/250 mL  Status:  Discontinued        1,250 mg 166.7 mL/hr over 90 Minutes Intravenous Every 24 hours 11/12/24 1744 11/12/24 1749   11/13/24 1000  azithromycin  (ZITHROMAX ) tablet 250 mg  Status:  Discontinued        250 mg Oral Daily 11/13/24 0819 11/15/24 1129   11/13/24 0500  piperacillin -tazobactam (ZOSYN ) IVPB 3.375 g  Status:  Discontinued        3.375 g 12.5 mL/hr over 240 Minutes Intravenous Every 8 hours 11/12/24 2112 11/13/24 0814   11/12/24 1900  vancomycin  (VANCOREADY) IVPB 1750 mg/350 mL  Status:  Discontinued        1,750 mg 175 mL/hr over 120 Minutes Intravenous  Once 11/12/24 1744 11/12/24 1749   11/12/24 1700  ceFEPIme  (MAXIPIME ) 2 g in sodium chloride  0.9 % 100 mL IVPB  Status:  Discontinued        2 g 200 mL/hr over 30 Minutes Intravenous Every 12 hours 11/12/24 1615 11/12/24 1745       Significant Hospital Events: Including procedures, antibiotic start and stop dates in addition to other pertinent events   12/5: Admitted by [facility].  Pulmonary consulted for severe AECOPD requiring BiPAP. 11/14/24-patient with Parainfluenza induced AECOPD. He was moved to Step down unit due to progressive dyspnea and is on BIPAP. He is on solumedrol BID and nebulizer therapy. Will continue to follow. Anticipate slow improvement over next 3-5 weeks with this virus. Hopefully dc in 3-4days.  11/15/24: Mid morning pt with increased WOB and accessory muscle use.  PCCM consulted.  Given 40 mg Lasix  by [name].  Increase duonebs to q4h and start Ceftriaxone  and increase Azithromycin  dose to 500 mg.  High risk for intubation.  11/16/24: Acutely decompensated, became severely hypoxic (sats 60's) with increased WOB, required intubation and mechanical ventilation. Given acute decompensation and persistent sinus tachycardia, CTa obtained which is negative  for PE, does show possible superimposed bacterial pneumonia.  Ceftriaxone  broadened to Zosyn . Telemetry alarming for ST elevation, obtained EKG and reviewed with Dr. Mady of Cardiology, felt not to be STEMI, obtaining Troponin and Echo. 11/17/24: No acute events overnight. Cr worsening at 1.41 this am. Remains on Zosyn . Awake and alert this am, transitioned to SBT but failed quickly. Became tachycardic and hypertensive. Restarted sedation.  11/18/24: No acute overnight events. Remains critically ill requiring mechanical ventilation. Will perform another SBT today with precedex  ~ FAILED SBT (tachycardia, agitation) 11/19/24: No acute events overnight.  Afebrile, hemodynamically stable, off vasopressors.  On minimal vent support, failed SBT due to increased WOB/tachypnea with HR 150's.  Transition Fentanyl   gtt to Dilaudid  gtt.    Interim History / Subjective:  As outlined above under Significant Hospital Events section  Objective   Blood pressure 112/68, pulse 87, temperature 98.2 F (36.8 C), temperature source Axillary, resp. rate 17, height 5' 6 (1.676 m), weight 71.8 kg, SpO2 96%.    Vent Mode: PCV FiO2 (%):  [50 %-60 %] 50 % Set Rate:  [16 bmp] 16 bmp PEEP:  [5 cmH20] 5 cmH20 Pressure Support:  [5 cmH20] 5 cmH20 Plateau Pressure:  [17 cmH20] 17 cmH20   Intake/Output Summary (Last 24 hours) at 11/19/2024 0745 Last data filed at 11/19/2024 9380 Gross per 24 hour  Intake 3127.52 ml  Output 2200 ml  Net 927.52 ml   Filed Weights   11/17/24 0500 11/18/24 0400 11/19/24 0330  Weight: 72.5 kg 71.3 kg 71.8 kg    Examination: General: Acute on chronically ill appearing male, intubated and sedated, in NAD HENT: Atraumatic, normocephalic, neck supple, no JVD Lungs: mildly coarse breath sounds throughout (R>L) with expiratory wheezing on the right, overbreathing the vent  Cardiovascular: Tachycardia, Regular rhythm, s1s2, no M/R/G Abdomen: Soft, nontender, nondistended, no  rebound/guarding, bowel sounds x 4 Extremities: Normal bulk and tone, no deformities, warm and well perfused, no edema Neuro: intubated and sedated, currently not following commands/WUA in progress, withdraws from pain, PERRL  GU: Foley catheter in place draining yellow urine   Resolved Hospital Problem list     Assessment & Plan:   #Sedation needs in setting of mechanical ventilation #Anxiety Hx: Depression. Anxiety, sleep apnea -Maintain a RASS goal of 0 to -1 -Dilaudid  and Propofol  to maintain RASS goal -Avoid sedating medications as able -Daily wake up assessment ~ utilize Precedex  for WUA -Consider starting Seroquel   #Acute on chronic Hypoxic & Hypercapnic Respiratory Failure #Acute COPD Exacerbation due to Parainfluenza Viral Infection #Superimposed Bacterial Pneumonia (+ Enterococcus Faecalis) #Lung Cancer Hx: COPD on home oxygen , Asthma  CTa Chest PE 12/9: negative for PE, new multifocal airspace consolidation in RLL, RML, and LLL, stable 2.6 cm pleural based mass like consolidation in lateral LUL, enlarged central pulmonary arteries suggesting pulmonary artery hypertension  - Full vent support - Implement lung protective strategies - Plateau pressures less than 30 cm H20 - Wean FiO2 & PEEP as tolerated to maintain O2 sats 88 to 92% - Follow intermittent Chest X-ray & ABG as needed - SBT when respiratory parameters met and mental status permits - Implement VAP Bundle - Bronchodilators & Pulmicort  nebs  - Continue IV Solumedrol ~ decrease dose to 40 mg daily on 12/12 - Completed Azithromycin  for severe COPD -Continue Zosyn  + Vancomycin  for Enterococcus Faecalis - Diuresis as BP and renal function permits ~ Hold lasix  for now  - Pulmonary toilet as able  #Tachycardia, likely compensatory in nature d/t respiratory failure #Mildly Elevated Troponin, suspect demand ischemia  Hx: HTN, HLD 12/9: Telemetry alarming ST elevation, EKG with ST elevation in inferior leads (but  no reciprocal changes), reviewed with Dr. Mady, felt not to be a STEMI 11/17/24 ECHO: LVEF 60-65%, mild LVH, severely elevated pulmonary artery pressure with mildly reduced RV systolic function; moderate thickening of aortic valve.  - Continuous cardiac monitoring - Gentle IV fluids - Vasopressors to maintain MAP goal >65 ~ stopped Levophed  this am - Continue clopidogrel  and ezetimibe  - HS Troponin peaked at 32  #Mild AKI - Trend BMP and monitor renal function - Creatinine this am 1.39 - Strict I/O - Avoid nephrotoxins as able - Ensure adequate renal perfusion -  ICU electrolyte replacement protocol - Pharmacy to assist with replacement as indicated  #GI Prophylaxis #GERD - Continue Protonix  40mg  - Constipation protocol prn - Antiemetics prn - Diet: Tube feeds  #Type II Diabetes Mellitus - ICU hypo/hyperglycemia protocol - SSI - Goal Range 140-180 - CBG Q4h  #Parainfluenza Viral Infection #Superimposed bacterial pneumonia: Enterococcus Faecalis -Monitor fever curve -Trend WBC's & Procalcitonin -Follow cultures as above -Continue empiric Vancomycin  & Zosyn  pending cultures & sensitivities      Best Practice (right click and Reselect all SmartList Selections daily)   Diet/type: tube feeds DVT prophylaxis: LMWH GI prophylaxis: PPI Lines: N/A Foley:  yes, and still needed  Code Status:  full code Last date of multidisciplinary goals of care discussion [12/12]  12/12: Pt's brother updated  at bedside.    Labs   CBC: Recent Labs  Lab 11/15/24 0636 11/16/24 0310 11/17/24 0315 11/18/24 0332 11/19/24 0336  WBC 31.7* 26.2* 23.5* 19.9* 15.1*  HGB 13.7 14.8 12.6* 11.3* 10.4*  HCT 45.6 50.5 41.5 37.1* 34.1*  MCV 95.4 96.4 92.6 93.5 93.2  PLT 254 221 219 178 163    Basic Metabolic Panel: Recent Labs  Lab 11/15/24 0636 11/16/24 0310 11/17/24 0315 11/18/24 0332 11/19/24 0336  NA 143 145 139 139 140  K 4.7 4.8 4.4 4.3 4.5  CL 97* 94* 95* 97* 100  CO2 37*  39* 34* 34* 32  GLUCOSE 171* 79 193* 174* 201*  BUN 40* 44* 47* 57* 59*  CREATININE 1.17 1.20 1.41* 1.39* 1.28*  CALCIUM  9.5 9.4 8.7* 8.7* 8.8*  MG 2.0 2.1 2.0 2.1 2.2  PHOS 3.1 3.4 2.9 3.0 3.0   GFR: Estimated Creatinine Clearance: 49.2 mL/min (A) (by C-G formula based on SCr of 1.28 mg/dL (H)). Recent Labs  Lab 11/13/24 1106 11/14/24 0459 11/14/24 1704 11/15/24 0636 11/15/24 1237 11/16/24 0310 11/17/24 0315 11/18/24 0332 11/19/24 0336  PROCALCITON 0.23  --  0.18  --  0.20  --   --   --   --   WBC  --    < >  --    < >  --  26.2* 23.5* 19.9* 15.1*   < > = values in this interval not displayed.    Liver Function Tests: Recent Labs  Lab 11/12/24 1434 11/13/24 0017  AST 19 18  ALT 26 21  ALKPHOS 113 102  BILITOT 0.3 <0.2  PROT 7.1 6.3*  ALBUMIN  4.2 3.7   No results for input(s): LIPASE, AMYLASE in the last 168 hours. No results for input(s): AMMONIA in the last 168 hours.  ABG    Component Value Date/Time   PHART 7.44 11/16/2024 0721   PCO2ART 67 (HH) 11/16/2024 0721   PO2ART 322 (H) 11/16/2024 0721   HCO3 39.3 (H) 11/17/2024 1757   ACIDBASEDEF 1.4 11/03/2024 1854   O2SAT 96.5 11/17/2024 1757     Coagulation Profile: Recent Labs  Lab 11/13/24 0017  INR 1.0    Cardiac Enzymes: No results for input(s): CKTOTAL, CKMB, CKMBINDEX, TROPONINI in the last 168 hours.  HbA1C: Hemoglobin A1C  Date/Time Value Ref Range Status  09/25/2013 03:56 AM 6.3 4.2 - 6.3 % Final    Comment:    The American Diabetes Association recommends that a primary goal of therapy should be <7% and that physicians should reevaluate the treatment regimen in patients with HbA1c values consistently >8%.    Hgb A1c MFr Bld  Date/Time Value Ref Range Status  07/16/2024 03:19 AM 6.0 (H) 4.8 - 5.6 %  Final    Comment:    (NOTE)         Prediabetes: 5.7 - 6.4         Diabetes: >6.4         Glycemic control for adults with diabetes: <7.0   10/17/2023 04:51 AM 5.7 (H)  4.8 - 5.6 % Final    Comment:    (NOTE) Pre diabetes:          5.7%-6.4%  Diabetes:              >6.4%  Glycemic control for   <7.0% adults with diabetes     CBG: Recent Labs  Lab 11/18/24 1547 11/18/24 1916 11/18/24 2324 11/19/24 0306 11/19/24 0734  GLUCAP 191* 162* 196* 179* 245*    Review of Systems:   Unable to assess due to intubation/sedation/critical illness   Past Medical History:  He,  has a past medical history of Anemia, Anxiety, Arthritis, Asthma, Cancer (HCC), Chronic back pain, COPD (chronic obstructive pulmonary disease) (HCC), Depression, Diabetes mellitus (HCC), Dyspnea, GERD (gastroesophageal reflux disease), Gout, Gout, Headache, History of blood clots, History of kidney stones, Hyperlipidemia, Hyperlipidemia, Hypertension, Kidney stones, Neuropathy, On home oxygen  therapy, Pneumonia (06/2017), Sleep apnea, and Ulcer of foot (HCC).   Surgical History:   Past Surgical History:  Procedure Laterality Date   APPENDECTOMY     DG FEET 2 VIEWS BILAT     IRRIGATION AND DEBRIDEMENT FOOT Left 07/11/2023   Procedure: IRRIGATION AND DEBRIDEMENT FOOT;  Surgeon: Ashley Soulier, DPM;  Location: [facility] ORS;  Service: Orthopedics/Podiatry;  Laterality: Left;   LIPOMA EXCISION Right 08/15/2017   Procedure: EXCISION TUMOR(CYST) FOOT;  Surgeon: Lilli Cough, DPM;  Location: [facility] ORS;  Service: Podiatry;  Laterality: Right;   LOWER EXTREMITY ANGIOGRAPHY Left 07/03/2023   Procedure: Lower Extremity Angiography;  Surgeon: Marea Selinda RAMAN, MD;  Location: [facility] IN[facility];  Service: Cardiovascular;  Laterality: Left;   OTHER SURGICAL HISTORY Bilateral Foot surgery     Social History:   reports that he has quit smoking. His smoking use included cigarettes. He started smoking about 7 years ago. He has a 4 pack-year smoking history. He has never used smokeless tobacco. He reports current alcohol  use of about 1.0 standard drink of alcohol  per week. He reports that he does not use  drugs.   Family History:  His family history includes Hypertension in his mother.   Allergies Allergies  Allergen Reactions   Bee Venom Anaphylaxis   Linezolid  Other (See Comments)    Thrombocytopenia, hyperlactatemia   Azithromycin  Itching and Rash     Home Medications  Prior to Admission medications   Medication Sig Start Date End Date Taking? Authorizing Provider  acetaminophen  (TYLENOL ) 325 MG tablet Take 650 mg by mouth every 6 (six) hours as needed.   Yes [provider]  albuterol  (VENTOLIN  HFA) 108 (90 Base) MCG/ACT inhaler Inhale 2 puffs into the lungs every 4 (four) hours as needed for wheezing or shortness of breath.   Yes [provider]  allopurinol  (ZYLOPRIM ) 300 MG tablet Take 600 mg by mouth daily. 02/24/20  Yes [provider]  aspirin  EC 81 MG tablet Take 1 tablet by mouth daily.   Yes [provider]  azelastine  (ASTELIN ) 0.1 % nasal spray Place 2 sprays into both nostrils 2 (two) times daily as needed. 05/27/23  Yes [provider]  Buprenorphine  HCl-Naloxone  HCl 8-2 MG FILM Place 1 Film under the tongue in the morning, at noon, and at  bedtime.   Yes [provider]  clopidogrel  (PLAVIX ) 75 MG tablet Take 1 tablet (75 mg total) by mouth daily. 02/02/21  Yes Leotis Bogus, MD  dapagliflozin  propanediol (FARXIGA ) 10 MG TABS tablet Take 1 tablet by mouth daily. 03/16/24 03/16/25 Yes [provider]  DULoxetine  (CYMBALTA ) 60 MG capsule Take 60 mg by mouth daily.   Yes [provider]  ezetimibe  (ZETIA ) 10 MG tablet Take 10 mg by mouth daily. 02/24/20  Yes [provider]  fluticasone  (FLONASE ) 50 MCG/ACT nasal spray Place 1 spray into both nostrils daily as needed for allergies. 10/16/18  Yes [provider]  Fluticasone -Umeclidin-Vilant (TRELEGY ELLIPTA) 100-62.5-25 MCG/ACT AEPB Inhale 1 puff into the lungs in the morning. 09/12/21  Yes [provider]  furosemide  (LASIX ) 20 MG  tablet Take 1 tablet (20 mg total) by mouth daily. 02/23/24  Yes Jens Durand, MD  gabapentin  (NEURONTIN ) 800 MG tablet Take 800 mg by mouth 4 (four) times daily. 10/22/23  Yes [provider]  ipratropium-albuterol  (DUONEB) 0.5-2.5 (3) MG/3ML SOLN Take 3 mLs by nebulization every 6 (six) hours as needed (wheezing).   Yes [provider]  metFORMIN  (GLUCOPHAGE ) 1000 MG tablet Take 1,000 mg by mouth daily.   Yes [provider]  metoprolol  succinate (TOPROL -XL) 50 MG 24 hr tablet Take 50 mg by mouth daily. 02/24/20  Yes [provider]  Multiple Vitamins-Minerals (MULTIVITAMIN ADULT, MINERALS, PO) Take 1 tablet by mouth every morning.   Yes [provider]  naloxone  (NARCAN ) nasal spray 4 mg/0.1 mL Place 1 spray into the nose once.   Yes [provider]  OHTUVAYRE  3 MG/2.5ML SUSP Take 5 mLs by mouth daily. 07/05/24  Yes [provider]  ondansetron  (ZOFRAN -ODT) 4 MG disintegrating tablet Take 4 mg by mouth every 8 (eight) hours as needed for nausea.   Yes [provider]  OXYGEN  Inhale 4 L into the lungs.   Yes [provider]  predniSONE  (DELTASONE ) 20 MG tablet 60mg  daily x 2 days, 40mg  daily x 2 days, 20mg  daily x 2 days then stop. 11/07/24  Yes Trudy Anthony HERO, MD  roflumilast  (DALIRESP ) 500 MCG TABS tablet Take 500 mcg by mouth daily. 08/15/21  Yes [provider]  simvastatin  (ZOCOR ) 40 MG tablet Take 40 mg by mouth at bedtime. 01/17/16  Yes [provider]  Syringe/Needle, Disp, (SYRINGE 3CC/25GX1) 25G X 1 3 ML MISC 1 Syringe by Does not apply route every 30 (thirty) days. 07/12/22  Yes Brahmanday, Govinda R, MD  traZODone  (DESYREL ) 50 MG tablet Take 50 mg by mouth at bedtime as needed for sleep. 01/22/24 01/21/25 Yes [provider]  bisacodyl  (DULCOLAX) 5 MG EC tablet Take 1 tablet (5 mg total) by mouth daily as needed for moderate constipation. Patient not taking: Reported on 11/03/2024  09/04/23   Dorinda Drue DASEN, MD     Critical care time: 40 minutes    Inge Lecher, AGACNP-BC [facility] Prefer epic messenger for cross cover needs If after hours, please call E-link

## 2024-11-19 NOTE — Plan of Care (Signed)
°  Problem: Education: Goal: Knowledge of General Education information will improve Description: Including pain rating scale, medication(s)/side effects and non-pharmacologic comfort measures Outcome: Progressing   Problem: Health Behavior/Discharge Planning: Goal: Ability to manage health-related needs will improve Outcome: Progressing   Problem: Clinical Measurements: Goal: Ability to maintain clinical measurements within normal limits will improve Outcome: Progressing Goal: Will remain free from infection Outcome: Progressing Goal: Diagnostic test results will improve Outcome: Progressing Goal: Respiratory complications will improve Outcome: Progressing Goal: Cardiovascular complication will be avoided Outcome: Progressing   Problem: Activity: Goal: Risk for activity intolerance will decrease Outcome: Progressing   Problem: Nutrition: Goal: Adequate nutrition will be maintained Outcome: Progressing   Problem: Coping: Goal: Level of anxiety will decrease Outcome: Progressing   Problem: Elimination: Goal: Will not experience complications related to bowel motility Outcome: Progressing Goal: Will not experience complications related to urinary retention Outcome: Progressing   Problem: Pain Managment: Goal: General experience of comfort will improve and/or be controlled Outcome: Progressing   Problem: Safety: Goal: Ability to remain free from injury will improve Outcome: Progressing   Problem: Skin Integrity: Goal: Risk for impaired skin integrity will decrease Outcome: Progressing   Problem: Education: Goal: Knowledge of disease or condition will improve Outcome: Progressing Goal: Knowledge of the prescribed therapeutic regimen will improve Outcome: Progressing Goal: Individualized Educational Video(s) Outcome: Progressing   Problem: Activity: Goal: Ability to tolerate increased activity will improve Outcome: Progressing Goal: Will verbalize the  importance of balancing activity with adequate rest periods Outcome: Progressing   Problem: Respiratory: Goal: Ability to maintain a clear airway will improve Outcome: Progressing Goal: Levels of oxygenation will improve Outcome: Progressing Goal: Ability to maintain adequate ventilation will improve Outcome: Progressing   Problem: Education: Goal: Ability to describe self-care measures that may prevent or decrease complications (Diabetes Survival Skills Education) will improve Outcome: Progressing Goal: Individualized Educational Video(s) Outcome: Progressing   Problem: Coping: Goal: Ability to adjust to condition or change in health will improve Outcome: Progressing   Problem: Fluid Volume: Goal: Ability to maintain a balanced intake and output will improve Outcome: Progressing   Problem: Health Behavior/Discharge Planning: Goal: Ability to identify and utilize available resources and services will improve Outcome: Progressing Goal: Ability to manage health-related needs will improve Outcome: Progressing   Problem: Metabolic: Goal: Ability to maintain appropriate glucose levels will improve Outcome: Progressing   Problem: Nutritional: Goal: Maintenance of adequate nutrition will improve Outcome: Progressing Goal: Progress toward achieving an optimal weight will improve Outcome: Progressing   Problem: Skin Integrity: Goal: Risk for impaired skin integrity will decrease Outcome: Progressing   Problem: Tissue Perfusion: Goal: Adequacy of tissue perfusion will improve Outcome: Progressing   Problem: Activity: Goal: Ability to tolerate increased activity will improve Outcome: Progressing   Problem: Respiratory: Goal: Ability to maintain a clear airway and adequate ventilation will improve Outcome: Progressing   Problem: Role Relationship: Goal: Method of communication will improve Outcome: Progressing

## 2024-11-20 ENCOUNTER — Inpatient Hospital Stay

## 2024-11-20 DIAGNOSIS — J9622 Acute and chronic respiratory failure with hypercapnia: Secondary | ICD-10-CM | POA: Diagnosis not present

## 2024-11-20 DIAGNOSIS — J9621 Acute and chronic respiratory failure with hypoxia: Secondary | ICD-10-CM | POA: Diagnosis not present

## 2024-11-20 DIAGNOSIS — C3492 Malignant neoplasm of unspecified part of left bronchus or lung: Secondary | ICD-10-CM | POA: Diagnosis not present

## 2024-11-20 DIAGNOSIS — J441 Chronic obstructive pulmonary disease with (acute) exacerbation: Secondary | ICD-10-CM | POA: Diagnosis not present

## 2024-11-20 LAB — CBC
HCT: 37.2 % — ABNORMAL LOW (ref 39.0–52.0)
Hemoglobin: 10.9 g/dL — ABNORMAL LOW (ref 13.0–17.0)
MCH: 28.5 pg (ref 26.0–34.0)
MCHC: 29.3 g/dL — ABNORMAL LOW (ref 30.0–36.0)
MCV: 97.4 fL (ref 80.0–100.0)
Platelets: 201 K/uL (ref 150–400)
RBC: 3.82 MIL/uL — ABNORMAL LOW (ref 4.22–5.81)
RDW: 16.2 % — ABNORMAL HIGH (ref 11.5–15.5)
WBC: 23.7 K/uL — ABNORMAL HIGH (ref 4.0–10.5)
nRBC: 0 % (ref 0.0–0.2)

## 2024-11-20 LAB — BASIC METABOLIC PANEL WITH GFR
Anion gap: 9 (ref 5–15)
BUN: 50 mg/dL — ABNORMAL HIGH (ref 8–23)
CO2: 31 mmol/L (ref 22–32)
Calcium: 9.1 mg/dL (ref 8.9–10.3)
Chloride: 100 mmol/L (ref 98–111)
Creatinine, Ser: 1.23 mg/dL (ref 0.61–1.24)
GFR, Estimated: >60 mL/min (ref >60)
Glucose, Bld: 126 mg/dL — ABNORMAL HIGH (ref 70–99)
Potassium: 4.1 mmol/L (ref 3.5–5.1)
Sodium: 141 mmol/L (ref 135–145)

## 2024-11-20 LAB — GLUCOSE, CAPILLARY
Glucose-Capillary: 111 mg/dL — ABNORMAL HIGH (ref 70–99)
Glucose-Capillary: 199 mg/dL — ABNORMAL HIGH (ref 70–99)
Glucose-Capillary: 222 mg/dL — ABNORMAL HIGH (ref 70–99)
Glucose-Capillary: 189 mg/dL — ABNORMAL HIGH (ref 70–99)
Glucose-Capillary: 137 mg/dL — ABNORMAL HIGH (ref 70–99)

## 2024-11-20 LAB — MAGNESIUM: Magnesium: 2.2 mg/dL (ref 1.7–2.4)

## 2024-11-20 LAB — TRIGLYCERIDES: Triglycerides: 218 mg/dL — ABNORMAL HIGH (ref <150)

## 2024-11-20 MED ORDER — NOREPINEPHRINE 4 MG/250ML-% IV SOLN
0.0000 ug/min | INTRAVENOUS | Status: AC
  Administered 2024-11-21: 2 ug/min via INTRAVENOUS
  Administered 2024-11-22: 19:00:00 3 ug/min via INTRAVENOUS
  Administered 2024-11-23: 6 ug/min via INTRAVENOUS
  Administered 2024-11-25: 11:00:00 1 ug/min via INTRAVENOUS
  Filled 2024-11-20 (×4): qty 250

## 2024-11-20 MED ORDER — ZINC OXIDE 40 % EX OINT
TOPICAL_OINTMENT | Freq: Three times a day (TID) | CUTANEOUS | Status: DC | PRN
  Filled 2024-11-20 (×3): qty 113

## 2024-11-20 MED ORDER — SODIUM CHLORIDE 0.9 % IV SOLN
250.0000 mL | INTRAVENOUS | Status: AC
  Administered 2024-11-20: 500 mL via INTRAVENOUS

## 2024-11-20 NOTE — Plan of Care (Signed)
°  Problem: Education: Goal: Knowledge of General Education information will improve Description: Including pain rating scale, medication(s)/side effects and non-pharmacologic comfort measures Outcome: Not Progressing   Problem: Health Behavior/Discharge Planning: Goal: Ability to manage health-related needs will improve Outcome: Not Progressing   Problem: Clinical Measurements: Goal: Ability to maintain clinical measurements within normal limits will improve Outcome: Progressing Goal: Respiratory complications will improve Outcome: Not Progressing Goal: Cardiovascular complication will be avoided Outcome: Progressing   Problem: Nutrition: Goal: Adequate nutrition will be maintained Outcome: Progressing   Problem: Coping: Goal: Level of anxiety will decrease Outcome: Progressing   Problem: Elimination: Goal: Will not experience complications related to bowel motility Outcome: Progressing   Problem: Pain Managment: Goal: General experience of comfort will improve and/or be controlled Outcome: Progressing

## 2024-11-20 NOTE — Progress Notes (Signed)
 PHARMACY CONSULT NOTE  Pharmacy Consult for Electrolyte Monitoring and Replacement   Recent Labs: Potassium (mmol/L)  Date Value  11/20/2024 4.1  05/17/2014 3.5   Magnesium  (mg/dL)  Date Value  87/86/7974 2.2   Calcium  (mg/dL)  Date Value  87/86/7974 9.1   Calcium , Total (mg/dL)  Date Value  95/76/7984 9.3   Albumin  (g/dL)  Date Value  87/93/7974 3.7  09/24/2013 3.6   Phosphorus (mg/dL)  Date Value  87/87/7974 3.0   Sodium (mmol/L)  Date Value  11/20/2024 141  03/31/2014 136    Assessment:  68 y.o. male admitted on 11/12/2024 with sepsis and acute hypoxic on chronic hypercarbic respiratory failure. PMH significant for COPD on home oxygen  4 L, non-small cell lung cancer s/p RXT, OSA on CPAP, GERD, history of TIA, asthma, diabetes type 2, HLD, HTN, and CKD-3a. Patient was also recently admitted from 11/26-11/30 for COPD exacerbation. Pharmacy has been consulted for replacing electrolytes  Nutrition: Vital AF at 30 mL/hr + FWF 30 mL every 4h  Goal of Therapy:  Electrolytes WNL  Plan:  --No electrolyte replacement warranted for today --Recheck electrolytes as ordered by PCCM team  Marolyn KATHEE Mare 11/20/2024 7:03 AM

## 2024-11-20 NOTE — Plan of Care (Signed)
°  Problem: Education: Goal: Knowledge of General Education information will improve Description: Including pain rating scale, medication(s)/side effects and non-pharmacologic comfort measures Outcome: Not Progressing   Problem: Health Behavior/Discharge Planning: Goal: Ability to manage health-related needs will improve Outcome: Not Progressing   Problem: Clinical Measurements: Goal: Ability to maintain clinical measurements within normal limits will improve Outcome: Not Progressing Goal: Will remain free from infection Outcome: Not Progressing Goal: Diagnostic test results will improve Outcome: Progressing Goal: Respiratory complications will improve Outcome: Not Progressing Goal: Cardiovascular complication will be avoided Outcome: Progressing   Problem: Activity: Goal: Risk for activity intolerance will decrease Outcome: Not Progressing   Problem: Nutrition: Goal: Adequate nutrition will be maintained Outcome: Progressing   Problem: Coping: Goal: Level of anxiety will decrease Outcome: Not Progressing   Problem: Elimination: Goal: Will not experience complications related to bowel motility Outcome: Not Progressing Goal: Will not experience complications related to urinary retention Outcome: Progressing   Problem: Pain Managment: Goal: General experience of comfort will improve and/or be controlled Outcome: Not Progressing   Problem: Safety: Goal: Ability to remain free from injury will improve Outcome: Progressing   Problem: Education: Goal: Knowledge of disease or condition will improve Outcome: Not Progressing Goal: Knowledge of the prescribed therapeutic regimen will improve Outcome: Not Progressing   Problem: Activity: Goal: Ability to tolerate increased activity will improve Outcome: Not Progressing Goal: Will verbalize the importance of balancing activity with adequate rest periods Outcome: Not Progressing   Problem: Respiratory: Goal: Ability to  maintain a clear airway will improve Outcome: Not Progressing Goal: Levels of oxygenation will improve Outcome: Not Progressing Goal: Ability to maintain adequate ventilation will improve Outcome: Not Progressing   Problem: Education: Goal: Ability to describe self-care measures that may prevent or decrease complications (Diabetes Survival Skills Education) will improve Outcome: Not Progressing   Problem: Coping: Goal: Ability to adjust to condition or change in health will improve Outcome: Not Progressing   Problem: Fluid Volume: Goal: Ability to maintain a balanced intake and output will improve Outcome: Progressing   Problem: Health Behavior/Discharge Planning: Goal: Ability to identify and utilize available resources and services will improve Outcome: Not Progressing Goal: Ability to manage health-related needs will improve Outcome: Not Progressing   Problem: Metabolic: Goal: Ability to maintain appropriate glucose levels will improve Outcome: Not Progressing   Problem: Nutritional: Goal: Maintenance of adequate nutrition will improve Outcome: Progressing Goal: Progress toward achieving an optimal weight will improve Outcome: Progressing   Problem: Skin Integrity: Goal: Risk for impaired skin integrity will decrease Outcome: Not Progressing   Problem: Tissue Perfusion: Goal: Adequacy of tissue perfusion will improve Outcome: Not Progressing   Problem: Activity: Goal: Ability to tolerate increased activity will improve Outcome: Not Progressing   Problem: Respiratory: Goal: Ability to maintain a clear airway and adequate ventilation will improve Outcome: Not Progressing   Problem: Role Relationship: Goal: Method of communication will improve Outcome: Not Progressing

## 2024-11-20 NOTE — Progress Notes (Signed)
 NAME:  Christian Fields, MRN:  [id number], DOB:  12/25/1955, LOS: 8 ADMISSION DATE:  11/12/2024, CONSULTATION DATE:  11/15/2024 REFERRING MD:  Dr. Awanda, CHIEF COMPLAINT:  Acute Respiratory Distress   Brief Pt Description / Synopsis:  68 y.o. male with PMHx significant for COPD and lung cancer admitted with Acute on Chronic Hypoxic and Hypercapnic Respiratory Failure due to Acute COPD Exacerbation due to Parainfluenza Infection requiring BiPAP.  Failed trial of BiPAP on 11/16/24 requiring intubation and mechanical ventilation, found to have superimposed Enterococcus Faecalis bacterial pneumonia.   History of Present Illness:  Christian Fields is a 68 year old male with a history of lifelong smoking and advanced COPD, had a diagnosis of stage I left upper lobe lung cancer after noticing an interval increase in the size of nodule from 1 cm to 1.4 in 2021. He went straight to SBRT due to being a poor surgical candidate for biopsy. He also is having recurrent COPD exacerbations and having rehospitalization for this. He has chronic hypoxemia and uses oxygen  at home up to 5 L/min. He has chronic physical deconditioning. He has chronic peripheral edema and also follows up with cardiology for essential hypertension and dyslipidemia. He also has a background history of CKD 3 A, sleep apnea noncompliant with CPAP therapy, polysubstance abuse and abdominal aortic aneurysm. He came to the hospital on this admission with complaints of worsening shortness of breath x 1 day was noted to have oxygen  saturation approximately 60% on his usual 4 L/min nasal cannula he denies sick contacts or fevers at home. He had a venous blood gas performed in the ED which showed mild acidemia with hypercapnia. He was treated with nebulizer therapy and steroids as well as BiPAP and reports slight improvement since then. He is started on empiric antibiotics with cefepime  and vancomycin  for possible pneumonia. PCCM consultation placed due to recurrent  hospitalization with advanced COPD, chronic hypoxemia and lung cancer.   Please see Significant Hospital Events section below for full detailed hospital course.  Pertinent  Medical History   Past Medical History:  Diagnosis Date   Anemia    Anxiety    Arthritis    Asthma    Cancer (HCC)    Basal Cell Skin Cancer   Chronic back pain    COPD (chronic obstructive pulmonary disease) (HCC)    Depression    Diabetes mellitus (HCC)    Dyspnea    GERD (gastroesophageal reflux disease)    Gout    Gout    Headache    History of blood clots    Left Leg--July 2018   History of kidney stones    Hyperlipidemia    Hyperlipidemia    Hypertension    Kidney stones    Neuropathy    On home oxygen  therapy    2 L / M   Pneumonia 06/2017   Sleep apnea    Ulcer of foot (HCC)    Right    Micro Data:  12/5: COVID/FLU/RSV PCR>> negative 12/5: RVP >> + Parainfluenza  12/5: MRSA PCR>> negative  12/9: Tracheal aspirate>> + enterococcus faecalis 12/9: Strep pneumo urinary antigen>>negative 12/9: Legionella urinary antigen>>negative  Antimicrobials:   Anti-infectives (From admission, onward)    Start     Dose/Rate Route Frequency Ordered Stop   11/19/24 0800  vancomycin  (VANCOCIN ) IVPB 1000 mg/200 mL premix  Status:  Discontinued        1,000 mg 200 mL/hr over 60 Minutes Intravenous Every 24 hours 11/18/24 1253 11/19/24 1036  11/18/24 1300  vancomycin  (VANCOREADY) IVPB 1250 mg/250 mL        1,250 mg 166.7 mL/hr over 90 Minutes Intravenous  Once 11/18/24 1129 11/18/24 1333   11/16/24 1200  piperacillin -tazobactam (ZOSYN ) IVPB 3.375 g        3.375 g 12.5 mL/hr over 240 Minutes Intravenous Every 8 hours 11/16/24 1110     11/15/24 1215  azithromycin  (ZITHROMAX ) 500 mg in sodium chloride  0.9 % 250 mL IVPB  Status:  Discontinued        500 mg 250 mL/hr over 60 Minutes Intravenous Every 24 hours 11/15/24 1129 11/18/24 1016   11/15/24 1215  cefTRIAXone  (ROCEPHIN ) 2 g in sodium chloride   0.9 % 100 mL IVPB  Status:  Discontinued        2 g 200 mL/hr over 30 Minutes Intravenous Every 24 hours 11/15/24 1129 11/16/24 1030   11/13/24 1900  vancomycin  (VANCOREADY) IVPB 1250 mg/250 mL  Status:  Discontinued        1,250 mg 166.7 mL/hr over 90 Minutes Intravenous Every 24 hours 11/12/24 1744 11/12/24 1749   11/13/24 1000  azithromycin  (ZITHROMAX ) tablet 250 mg  Status:  Discontinued        250 mg Oral Daily 11/13/24 0819 11/15/24 1129   11/13/24 0500  piperacillin -tazobactam (ZOSYN ) IVPB 3.375 g  Status:  Discontinued        3.375 g 12.5 mL/hr over 240 Minutes Intravenous Every 8 hours 11/12/24 2112 11/13/24 0814   11/12/24 1900  vancomycin  (VANCOREADY) IVPB 1750 mg/350 mL  Status:  Discontinued        1,750 mg 175 mL/hr over 120 Minutes Intravenous  Once 11/12/24 1744 11/12/24 1749   11/12/24 1700  ceFEPIme  (MAXIPIME ) 2 g in sodium chloride  0.9 % 100 mL IVPB  Status:  Discontinued        2 g 200 mL/hr over 30 Minutes Intravenous Every 12 hours 11/12/24 1615 11/12/24 1745       Significant Hospital Events: Including procedures, antibiotic start and stop dates in addition to other pertinent events   12/5: Admitted by [facility].  Pulmonary consulted for severe AECOPD requiring BiPAP. 11/14/24-patient with Parainfluenza induced AECOPD. He was moved to Step down unit due to progressive dyspnea and is on BIPAP. He is on solumedrol BID and nebulizer therapy. Will continue to follow. Anticipate slow improvement over next 3-5 weeks with this virus. Hopefully dc in 3-4days.  11/15/24: Mid morning pt with increased WOB and accessory muscle use.  PCCM consulted.  Given 40 mg Lasix  by [name].  Increase duonebs to q4h and start Ceftriaxone  and increase Azithromycin  dose to 500 mg.  High risk for intubation.  11/16/24: Acutely decompensated, became severely hypoxic (sats 60's) with increased WOB, required intubation and mechanical ventilation. Given acute decompensation and persistent sinus tachycardia, CTa  obtained which is negative for PE, does show possible superimposed bacterial pneumonia.  Ceftriaxone  broadened to Zosyn . Telemetry alarming for ST elevation, obtained EKG and reviewed with Dr. Mady of Cardiology, felt not to be STEMI, obtaining Troponin and Echo. 11/17/24: No acute events overnight. Cr worsening at 1.41 this am. Remains on Zosyn . Awake and alert this am, transitioned to SBT but failed quickly. Became tachycardic and hypertensive. Restarted sedation.  11/18/24: No acute overnight events. Remains critically ill requiring mechanical ventilation. Will perform another SBT today with precedex  ~ FAILED SBT (tachycardia, agitation) 11/19/24: No acute events overnight.  Afebrile, hemodynamically stable, off vasopressors.  On minimal vent support, failed SBT due to increased WOB/tachypnea with HR 150's.  Transition Fentanyl  gtt to Dilaudid  gtt.    Interim History / Subjective:  As outlined above under Significant Hospital Events section  Objective   Blood pressure 98/68, pulse (!) 127, temperature 98.9 F (37.2 C), temperature source Axillary, resp. rate 10, height 5' 6 (1.676 m), weight 73.6 kg, SpO2 96%.    Vent Mode: PCV FiO2 (%):  [50 %-70 %] 70 % Set Rate:  [15 bmp] 15 bmp Vt Set:  [400 mL-450 mL] 400 mL PEEP:  [5 cmH20-8 cmH20] 8 cmH20   Intake/Output Summary (Last 24 hours) at 11/20/2024 0849 Last data filed at 11/20/2024 0818 Gross per 24 hour  Intake 3287.77 ml  Output 1495 ml  Net 1792.77 ml   Filed Weights   11/18/24 0400 11/19/24 0330 11/20/24 0423  Weight: 71.3 kg 71.8 kg 73.6 kg    Examination: General: Acute on chronically ill appearing male, intubated and somnolent, in NAD HENT: Atraumatic, normocephalic, neck supple, no JVD Lungs: mildly coarse breath sounds throughout (R>L) with expiratory wheezing on the right, overbreathing the vent  Cardiovascular: Tachycardia, Regular rhythm, s1s2, no M/R/G Abdomen: Soft, nontender, nondistended, no rebound/guarding,  bowel sounds x 4 Extremities: Normal bulk and tone, no deformities, warm and well perfused, no edema Neuro: intubated and mildly sedated, currently not following commands/WUA in progress, withdraws from pain, PERRL  GU: Foley catheter in place draining yellow urine   Resolved Hospital Problem list     Assessment & Plan:   #Sedation needs in setting of mechanical ventilation #Anxiety Hx: Depression. Anxiety, sleep apnea -Maintain a RASS goal of 0 to -1 -Dilaudid  and Propofol  to maintain RASS goal -Avoid sedating medications as able -Daily wake up assessment with TCT as tolerated ~ utilize Precedex  for WUA -Consider starting Seroquel    #Acute on chronic Hypoxic & Hypercapnic Respiratory Failure requiring mechanical ventilation #Acute COPD Exacerbation due to Parainfluenza Viral Infection #Superimposed Bacterial Pneumonia (+ Enterococcus Faecalis) #Probable Lung Cancer Hx: COPD on home oxygen , Asthma  CTa Chest PE 12/9: negative for PE, new multifocal airspace consolidation in RLL, RML, and LLL, stable 2.6 cm pleural based mass like consolidation in lateral LUL, enlarged central pulmonary arteries suggesting pulmonary artery hypertension  - Full vent support, on PC 15 + 8 when not trialing PSV - Implement lung protective strategies - Plateau pressures less than 30 cm H20 - Wean FiO2 & PEEP as tolerated to maintain O2 sats 88 to 92% - Follow intermittent Chest X-ray & ABG as needed - SBT when respiratory parameters met and mental status permits - Implement VAP Bundle - Bronchodilators & Pulmicort  nebs  - Continue IV Solumedrol ~ decrease dose to 40 mg daily on 12/12 - Completed Azithromycin  for severe COPD - Continue Zosyn , LD Vancomycin  12/11 for Enterococcus Faecalis - Diuresis as BP and renal function permits ~ Hold lasix  for now  - Pulmonary toilet as able  #Tachycardia, likely compensatory in nature d/t respiratory failure #Mildly Elevated Troponin, suspect demand ischemia   Hx: HTN, HLD 12/9: Telemetry alarming ST elevation, EKG with ST elevation in inferior leads (but no reciprocal changes), reviewed with Dr. Mady, felt not to be a STEMI 11/17/24 ECHO: LVEF 60-65%, mild LVH, severely elevated pulmonary artery pressure with mildly reduced RV systolic function; moderate thickening of aortic valve.  - Continuous cardiac monitoring - Gentle IV fluids - Vasopressors to maintain MAP goal >65 ~ stopped Levophed  12/12 - Continue clopidogrel  and ezetimibe  - HS Troponin peaked at 32  #Mild AKI - Trend BMP and monitor renal function - Creatinine  1.39 -> 1.28 - Strict I/O - Avoid nephrotoxins as able - Ensure adequate renal perfusion - ICU electrolyte replacement protocol - Pharmacy to assist with replacement as indicated  #GI Prophylaxis #GERD - Continue Protonix  40mg  - Constipation protocol prn - Antiemetics prn - Diet: Tube feeds  #Type II Diabetes Mellitus - ICU hypo/hyperglycemia protocol - SSI - Goal Range 140-180 - CBG Q4h  #Parainfluenza Viral Infection #Superimposed bacterial pneumonia: Enterococcus Faecalis -Monitor fever curve -Trend WBC's & Procalcitonin -Follow cultures as above -Continue empiric Vancomycin  & Zosyn  pending cultures & sensitivities   Best Practice (right click and Reselect all SmartList Selections daily)   Diet/type: tube feeds DVT prophylaxis: LMWH GI prophylaxis: PPI Lines: N/A Foley:  yes, and still needed  Code Status:  full code Last date of multidisciplinary goals of care discussion [12/12]  12/13: Pt's brother updated  at bedside.    Labs   CBC: Recent Labs  Lab 11/16/24 0310 11/17/24 0315 11/18/24 0332 11/19/24 0336 11/20/24 0331  WBC 26.2* 23.5* 19.9* 15.1* 23.7*  HGB 14.8 12.6* 11.3* 10.4* 10.9*  HCT 50.5 41.5 37.1* 34.1* 37.2*  MCV 96.4 92.6 93.5 93.2 97.4  PLT 221 219 178 163 201    Basic Metabolic Panel: Recent Labs  Lab 11/15/24 0636 11/16/24 0310 11/17/24 0315 11/18/24 0332  11/19/24 0336 11/20/24 0331  NA 143 145 139 139 140 141  K 4.7 4.8 4.4 4.3 4.5 4.1  CL 97* 94* 95* 97* 100 100  CO2 37* 39* 34* 34* 32 31  GLUCOSE 171* 79 193* 174* 201* 126*  BUN 40* 44* 47* 57* 59* 50*  CREATININE 1.17 1.20 1.41* 1.39* 1.28* 1.23  CALCIUM  9.5 9.4 8.7* 8.7* 8.8* 9.1  MG 2.0 2.1 2.0 2.1 2.2 2.2  PHOS 3.1 3.4 2.9 3.0 3.0  --    GFR: Estimated Creatinine Clearance: 51.1 mL/min (by C-G formula based on SCr of 1.23 mg/dL). Recent Labs  Lab 11/13/24 1106 11/14/24 0459 11/14/24 1704 11/15/24 0636 11/15/24 1237 11/16/24 0310 11/17/24 0315 11/18/24 0332 11/19/24 0336 11/20/24 0331  PROCALCITON 0.23  --  0.18  --  0.20  --   --   --   --   --   WBC  --    < >  --    < >  --    < > 23.5* 19.9* 15.1* 23.7*   < > = values in this interval not displayed.    Liver Function Tests: No results for input(s): AST, ALT, ALKPHOS, BILITOT, PROT, ALBUMIN  in the last 168 hours.  No results for input(s): LIPASE, AMYLASE in the last 168 hours. No results for input(s): AMMONIA in the last 168 hours.  ABG    Component Value Date/Time   PHART 7.44 11/16/2024 0721   PCO2ART 67 (HH) 11/16/2024 0721   PO2ART 322 (H) 11/16/2024 0721   HCO3 39.3 (H) 11/17/2024 1757   ACIDBASEDEF 1.4 11/03/2024 1854   O2SAT 96.5 11/17/2024 1757     Coagulation Profile: No results for input(s): INR, PROTIME in the last 168 hours.   Cardiac Enzymes: No results for input(s): CKTOTAL, CKMB, CKMBINDEX, TROPONINI in the last 168 hours.  HbA1C: Hemoglobin A1C  Date/Time Value Ref Range Status  09/25/2013 03:56 AM 6.3 4.2 - 6.3 % Final    Comment:    The American Diabetes Association recommends that a primary goal of therapy should be <7% and that physicians should reevaluate the treatment regimen in patients with HbA1c values consistently >8%.    Hgb A1c  MFr Bld  Date/Time Value Ref Range Status  07/16/2024 03:19 AM 6.0 (H) 4.8 - 5.6 % Final    Comment:     (NOTE)         Prediabetes: 5.7 - 6.4         Diabetes: >6.4         Glycemic control for adults with diabetes: <7.0   10/17/2023 04:51 AM 5.7 (H) 4.8 - 5.6 % Final    Comment:    (NOTE) Pre diabetes:          5.7%-6.4%  Diabetes:              >6.4%  Glycemic control for   <7.0% adults with diabetes     CBG: Recent Labs  Lab 11/19/24 1522 11/19/24 2006 11/19/24 2342 11/20/24 0322 11/20/24 0730  GLUCAP 127* 125* 101* 111* 199*    Review of Systems:   Unable to assess due to intubation/sedation/critical illness   Past Medical History:  He,  has a past medical history of Anemia, Anxiety, Arthritis, Asthma, Cancer (HCC), Chronic back pain, COPD (chronic obstructive pulmonary disease) (HCC), Depression, Diabetes mellitus (HCC), Dyspnea, GERD (gastroesophageal reflux disease), Gout, Gout, Headache, History of blood clots, History of kidney stones, Hyperlipidemia, Hyperlipidemia, Hypertension, Kidney stones, Neuropathy, On home oxygen  therapy, Pneumonia (06/2017), Sleep apnea, and Ulcer of foot (HCC).   Surgical History:   Past Surgical History:  Procedure Laterality Date   APPENDECTOMY     DG FEET 2 VIEWS BILAT     IRRIGATION AND DEBRIDEMENT FOOT Left 07/11/2023   Procedure: IRRIGATION AND DEBRIDEMENT FOOT;  Surgeon: Ashley Soulier, DPM;  Location: [facility] ORS;  Service: Orthopedics/Podiatry;  Laterality: Left;   LIPOMA EXCISION Right 08/15/2017   Procedure: EXCISION TUMOR(CYST) FOOT;  Surgeon: Lilli Cough, DPM;  Location: [facility] ORS;  Service: Podiatry;  Laterality: Right;   LOWER EXTREMITY ANGIOGRAPHY Left 07/03/2023   Procedure: Lower Extremity Angiography;  Surgeon: Marea Selinda RAMAN, MD;  Location: [facility] INVASIVE CV LAB;  Service: Cardiovascular;  Laterality: Left;   OTHER SURGICAL HISTORY Bilateral Foot surgery     Social History:   reports that he has quit smoking. His smoking use included cigarettes. He started smoking about 7 years ago. He has a 4 pack-year smoking history.  He has never used smokeless tobacco. He reports current alcohol  use of about 1.0 standard drink of alcohol  per week. He reports that he does not use drugs.   Family History:  His family history includes Hypertension in his mother.   Allergies Allergies  Allergen Reactions   Bee Venom Anaphylaxis   Linezolid  Other (See Comments)    Thrombocytopenia, hyperlactatemia   Azithromycin  Itching and Rash     Home Medications  Prior to Admission medications   Medication Sig Start Date End Date Taking? Authorizing Provider  acetaminophen  (TYLENOL ) 325 MG tablet Take 650 mg by mouth every 6 (six) hours as needed.   Yes [provider]  albuterol  (VENTOLIN  HFA) 108 (90 Base) MCG/ACT inhaler Inhale 2 puffs into the lungs every 4 (four) hours as needed for wheezing or shortness of breath.   Yes [provider]  allopurinol  (ZYLOPRIM ) 300 MG tablet Take 600 mg by mouth daily. 02/24/20  Yes [provider]  aspirin  EC 81 MG tablet Take 1 tablet by mouth daily.   Yes [provider]  azelastine  (ASTELIN ) 0.1 % nasal spray Place 2 sprays into both nostrils 2 (two) times daily as needed. 05/27/23  Yes [provider]  Buprenorphine   HCl-Naloxone  HCl 8-2 MG FILM Place 1 Film under the tongue in the morning, at noon, and at bedtime.   Yes [provider]  clopidogrel  (PLAVIX ) 75 MG tablet Take 1 tablet (75 mg total) by mouth daily. 02/02/21  Yes Leotis Bogus, MD  dapagliflozin  propanediol (FARXIGA ) 10 MG TABS tablet Take 1 tablet by mouth daily. 03/16/24 03/16/25 Yes [provider]  DULoxetine  (CYMBALTA ) 60 MG capsule Take 60 mg by mouth daily.   Yes [provider]  ezetimibe  (ZETIA ) 10 MG tablet Take 10 mg by mouth daily. 02/24/20  Yes [provider]  fluticasone  (FLONASE ) 50 MCG/ACT nasal spray Place 1 spray into both nostrils daily as needed for allergies. 10/16/18  Yes [provider]  Fluticasone -Umeclidin-Vilant  (TRELEGY ELLIPTA) 100-62.5-25 MCG/ACT AEPB Inhale 1 puff into the lungs in the morning. 09/12/21  Yes [provider]  furosemide  (LASIX ) 20 MG tablet Take 1 tablet (20 mg total) by mouth daily. 02/23/24  Yes Jens Durand, MD  gabapentin  (NEURONTIN ) 800 MG tablet Take 800 mg by mouth 4 (four) times daily. 10/22/23  Yes [provider]  ipratropium-albuterol  (DUONEB) 0.5-2.5 (3) MG/3ML SOLN Take 3 mLs by nebulization every 6 (six) hours as needed (wheezing).   Yes [provider]  metFORMIN  (GLUCOPHAGE ) 1000 MG tablet Take 1,000 mg by mouth daily.   Yes [provider]  metoprolol  succinate (TOPROL -XL) 50 MG 24 hr tablet Take 50 mg by mouth daily. 02/24/20  Yes [provider]  Multiple Vitamins-Minerals (MULTIVITAMIN ADULT, MINERALS, PO) Take 1 tablet by mouth every morning.   Yes [provider]  naloxone  (NARCAN ) nasal spray 4 mg/0.1 mL Place 1 spray into the nose once.   Yes [provider]  OHTUVAYRE  3 MG/2.5ML SUSP Take 5 mLs by mouth daily. 07/05/24  Yes [provider]  ondansetron  (ZOFRAN -ODT) 4 MG disintegrating tablet Take 4 mg by mouth every 8 (eight) hours as needed for nausea.   Yes [provider]  OXYGEN  Inhale 4 L into the lungs.   Yes [provider]  predniSONE  (DELTASONE ) 20 MG tablet 60mg  daily x 2 days, 40mg  daily x 2 days, 20mg  daily x 2 days then stop. 11/07/24  Yes Trudy Anthony HERO, MD  roflumilast  (DALIRESP ) 500 MCG TABS tablet Take 500 mcg by mouth daily. 08/15/21  Yes [provider]  simvastatin  (ZOCOR ) 40 MG tablet Take 40 mg by mouth at bedtime. 01/17/16  Yes [provider]  Syringe/Needle, Disp, (SYRINGE 3CC/25GX1) 25G X 1 3 ML MISC 1 Syringe by Does not apply route every 30 (thirty) days. 07/12/22  Yes Brahmanday, Govinda R, MD  traZODone  (DESYREL ) 50 MG tablet Take 50 mg by mouth at bedtime as needed for sleep. 01/22/24 01/21/25 Yes [provider]   bisacodyl  (DULCOLAX) 5 MG EC tablet Take 1 tablet (5 mg total) by mouth daily as needed for moderate constipation. Patient not taking: Reported on 11/03/2024 09/04/23   Dorinda Drue DASEN, MD     Critical care time: 55 minutes    Lazariah Savard, ACNP-BC Pulmonary Critical Care, [place] Phone: [phone]

## 2024-11-21 ENCOUNTER — Inpatient Hospital Stay

## 2024-11-21 DIAGNOSIS — J441 Chronic obstructive pulmonary disease with (acute) exacerbation: Secondary | ICD-10-CM | POA: Diagnosis not present

## 2024-11-21 DIAGNOSIS — J9622 Acute and chronic respiratory failure with hypercapnia: Secondary | ICD-10-CM

## 2024-11-21 DIAGNOSIS — C3492 Malignant neoplasm of unspecified part of left bronchus or lung: Secondary | ICD-10-CM | POA: Diagnosis not present

## 2024-11-21 DIAGNOSIS — J9621 Acute and chronic respiratory failure with hypoxia: Secondary | ICD-10-CM | POA: Diagnosis not present

## 2024-11-21 DIAGNOSIS — R000 Tachycardia, unspecified: Secondary | ICD-10-CM

## 2024-11-21 LAB — BLOOD GAS, ARTERIAL
Acid-Base Excess: 6.2 mmol/L — ABNORMAL HIGH (ref 0.0–2.0)
Acid-Base Excess: 7.3 mmol/L — ABNORMAL HIGH (ref 0.0–2.0)
Acid-Base Excess: 7 mmol/L — ABNORMAL HIGH (ref 0.0–2.0)
Acid-Base Excess: 9.3 mmol/L — ABNORMAL HIGH (ref 0.0–2.0)
Bicarbonate: 40.3 mmol/L — ABNORMAL HIGH (ref 20.0–28.0)
Bicarbonate: 38.1 mmol/L — ABNORMAL HIGH (ref 20.0–28.0)
Bicarbonate: 37.2 mmol/L — ABNORMAL HIGH (ref 20.0–28.0)
Bicarbonate: 40.3 mmol/L — ABNORMAL HIGH (ref 20.0–28.0)
FIO2: 100 %
FIO2: 80 %
FIO2: 60 %
FIO2: 60 %
Mechanical Rate: 15
Mechanical Rate: 24
Mechanical Rate: 22
Mechanical Rate: 22
O2 Saturation: 95.7 %
O2 Saturation: 98.1 %
O2 Saturation: 93.3 %
O2 Saturation: 94.5 %
PEEP: 10 cmH2O
PEEP: 8 cmH2O
PEEP: 8 cmH2O
PEEP: 8 cmH2O
Patient temperature: 37
Patient temperature: 37
Patient temperature: 37
Patient temperature: 37
Pressure control: 16 cmH2O
Pressure control: 24 cmH2O
Pressure control: 22 cmH2O
Pressure control: 25 cmH2O
pCO2 arterial: >123 mmHg (ref 32–48)
pCO2 arterial: 89 mmHg (ref 32–48)
pCO2 arterial: 83 mmHg (ref 32–48)
pCO2 arterial: 92 mmHg (ref 32–48)
pH, Arterial: 7.11 — CL (ref 7.35–7.45)
pH, Arterial: 7.24 — ABNORMAL LOW (ref 7.35–7.45)
pH, Arterial: 7.26 — ABNORMAL LOW (ref 7.35–7.45)
pH, Arterial: 7.25 — ABNORMAL LOW (ref 7.35–7.45)
pO2, Arterial: 78 mmHg — ABNORMAL LOW (ref 83–108)
pO2, Arterial: 97 mmHg (ref 83–108)
pO2, Arterial: 64 mmHg — ABNORMAL LOW (ref 83–108)
pO2, Arterial: 66 mmHg — ABNORMAL LOW (ref 83–108)

## 2024-11-21 LAB — BASIC METABOLIC PANEL WITH GFR
Anion gap: 10 (ref 5–15)
BUN: 60 mg/dL — ABNORMAL HIGH (ref 8–23)
CO2: 33 mmol/L — ABNORMAL HIGH (ref 22–32)
Calcium: 9.1 mg/dL (ref 8.9–10.3)
Chloride: 100 mmol/L (ref 98–111)
Creatinine, Ser: 1.27 mg/dL — ABNORMAL HIGH (ref 0.61–1.24)
GFR, Estimated: >60 mL/min (ref >60)
Glucose, Bld: 167 mg/dL — ABNORMAL HIGH (ref 70–99)
Potassium: 4.2 mmol/L (ref 3.5–5.1)
Sodium: 144 mmol/L (ref 135–145)

## 2024-11-21 LAB — CBC
HCT: 39.8 % (ref 39.0–52.0)
Hemoglobin: 11.2 g/dL — ABNORMAL LOW (ref 13.0–17.0)
MCH: 28.3 pg (ref 26.0–34.0)
MCHC: 28.1 g/dL — ABNORMAL LOW (ref 30.0–36.0)
MCV: 100.5 fL — ABNORMAL HIGH (ref 80.0–100.0)
Platelets: 187 K/uL (ref 150–400)
RBC: 3.96 MIL/uL — ABNORMAL LOW (ref 4.22–5.81)
RDW: 15.9 % — ABNORMAL HIGH (ref 11.5–15.5)
WBC: 27.3 K/uL — ABNORMAL HIGH (ref 4.0–10.5)
nRBC: 0 % (ref 0.0–0.2)

## 2024-11-21 LAB — GLUCOSE, CAPILLARY
Glucose-Capillary: 136 mg/dL — ABNORMAL HIGH (ref 70–99)
Glucose-Capillary: 138 mg/dL — ABNORMAL HIGH (ref 70–99)
Glucose-Capillary: 144 mg/dL — ABNORMAL HIGH (ref 70–99)
Glucose-Capillary: 147 mg/dL — ABNORMAL HIGH (ref 70–99)
Glucose-Capillary: 154 mg/dL — ABNORMAL HIGH (ref 70–99)
Glucose-Capillary: 215 mg/dL — ABNORMAL HIGH (ref 70–99)
Glucose-Capillary: 224 mg/dL — ABNORMAL HIGH (ref 70–99)

## 2024-11-21 LAB — MAGNESIUM: Magnesium: 2.3 mg/dL (ref 1.7–2.4)

## 2024-11-21 MED ORDER — MIDAZOLAM HCL 2 MG/2ML IJ SOLN
INTRAMUSCULAR | Status: AC
  Administered 2024-11-21: 2 mg via INTRAVENOUS
  Filled 2024-11-21: qty 2

## 2024-11-21 MED ORDER — VECURONIUM BROMIDE 10 MG IV SOLR
INTRAVENOUS | Status: AC
  Administered 2024-11-21: 10 mg via INTRAVENOUS
  Filled 2024-11-21: qty 10

## 2024-11-21 MED ORDER — LACTATED RINGERS IV BOLUS
500.0000 mL | Freq: Once | INTRAVENOUS | Status: AC
  Administered 2024-11-21: 500 mL via INTRAVENOUS

## 2024-11-21 MED ORDER — MIDAZOLAM HCL (PF) 2 MG/2ML IJ SOLN: 2.0000 mg | Freq: Once | INTRAMUSCULAR | Status: AC

## 2024-11-21 MED ORDER — VECURONIUM BROMIDE 10 MG IV SOLR: 10.0000 mg | Freq: Once | INTRAVENOUS | Status: AC

## 2024-11-21 NOTE — Progress Notes (Signed)
 eLink Physician-Brief Progress Note Patient Name: Christian Fields DOB: 07/26/1956 MRN: [id number]   Date of Service  11/21/2024  HPI/Events of Note  Called to camera in due to increased work of breathing Seen on PAC PC 16 above 8 PEEP Peak pressure 24 TV 300s, synchronized on vent No autopeep  eICU Interventions  Still with increased work of breathing despite increasing PEEP from 5 to now 10 Discussed with NP Ouma to sedate adequately (RASS less than -3) before giving NMB     Intervention Category Intermediate Interventions: Respiratory distress - evaluation and management  Damien ONEIDA Grout 11/21/2024, 2:49 AM

## 2024-11-21 NOTE — Progress Notes (Signed)
 Significant Overnight Brief Event Note  At the beginning of the shift, the patient was transitioned to [facility] from previous PSV (PC 18/8, RR 16, FiO? 0.40). Shortly thereafter, notified by RN that the patient was persistently demonstrating increased work of breathing, including use of accessory muscles, diaphoresis, tachypnea, and tachycardia in the 130-140s. The patient remained on sedation with propofol   @ 60 mcg/hr without clinical improvement. Discussed with attending Dr. Isadora, who recommended minimal sedation and transition the patient to PSV. The patient was switched to PSV at 20:14 (PS 15/8, FiO? 0.60). Ventilator parameters at that time showed peak pressure 24, plateau pressure 16, with initial tidal volumes >500 mL. The patient was synchronous with the ventilator without evidence of auto-PEEP; however, he continued to exhibit persistent tachycardia, tachypnea, and worsening work of breathing.  At approximately 02:30 am, the patient experienced acute clinical deterioration, including oxygen  desaturation into the low 70s, persistent tachycardia in the 140s, and a 9-beat run of ventricular tachycardia with hr up to the 170s. arrhythmia secondary to severe respiratory acidosis The patient was noted to have bulging neck veins, diaphoresis, and marked respiratory distress. Elink Dr. Marion was notified and camera in for further evaluation. At that time, the patient was on propofol  at 60 mcg/kg/min and ventilated on PC 16 above PEEP 8, with peak pressures of 24 and tidal volumes in the 200-300 mL range. Although the patient appeared synchronous with the ventilator, he continued to demonstrate tachypnea with shallow, erratic respirations interspersed with forceful deep inspiratory efforts. Breathing was labored with prominent use of accessory muscles, along with paradoxical breathing, bulging of the neck veins was noted, and diaphoresis. Dr. Marion recommended escalation of sedation to a target RASS of -3 to  -4, and neuromuscular blockade was administered, resulting in immediate clinical improvement.   Later an arterial line was placed, and an arterial blood gas (ABG) and stat chest X-ray were obtained. The ABG obtained at 03:21 via arterial line showed critical acidemia and severe hypercapnia with pH 7.11, PaCO? >123 mmHg, PaO? 78 mmHg, HCO?? 40.3 mmol/L, base excess +6.2, and O? saturation 95.7%. Vent Setting adjusted to pressure control ventilation: PC 24/8, RR 24, FiO? 1.0, with Ppeak 32, Ti 0.81 sec, and I:E 1:2.1. Following these changes, tidal volumes improved to approximately 430-433 mL. The patient was noted to be resting comfortably with no work of breathing. Vital signs at that time included HR in the 130s, SpO? 98%, RR 7, and BP 120/61 (MAP 78) via arterial line.  A repeat ABG 1-2 hours post vent changes: pH 7.24, PaCO? 89 mmHg, PaO? 97 mmHg, HCO?? 38.1 mmol/L, base excess +7.3, O? sat 98.1% show marked improvement in hypercapnia and acidemia, compared to the prior ABG (pH 7.11, PaCO? >123 mmHg).     Almarie Nose DNP, CCRN, FNP-C, AGACNP-BC Acute Care & Family Nurse Practitioner [facility] PCCM on call pager [phone]

## 2024-11-21 NOTE — Progress Notes (Signed)
 NAME:  Christian Fields, MRN:  [id number], DOB:  09/16/1956, LOS: 9 ADMISSION DATE:  11/12/2024, CHIEF COMPLAINT:  Respiratory Failure   History of Present Illness:  Christian Fields is a 68 year old male with a history of lifelong smoking and advanced COPD, had a diagnosis of stage I left upper lobe lung cancer after noticing an interval increase in the size of nodule from 1 cm to 1.4 in 2021. He went straight to SBRT due to being a poor surgical candidate for biopsy. He also is having recurrent COPD exacerbations and having rehospitalization for this. He has chronic hypoxemia and uses oxygen  at home up to 5 L/min. He has chronic physical deconditioning. He has chronic peripheral edema and also follows up with cardiology for essential hypertension and dyslipidemia. He also has a background history of CKD 3 A, sleep apnea noncompliant with CPAP therapy, polysubstance abuse and abdominal aortic aneurysm. He came to the hospital on this admission with complaints of worsening shortness of breath x 1 day was noted to have oxygen  saturation approximately 60% on his usual 4 L/min nasal cannula he denies sick contacts or fevers at home. He had a venous blood gas performed in the ED which showed mild acidemia with hypercapnia. He was treated with nebulizer therapy and steroids as well as BiPAP and reports slight improvement since then. He is started on empiric antibiotics with cefepime  and vancomycin  for possible pneumonia. PCCM consultation placed due to recurrent hospitalization with advanced COPD, chronic hypoxemia and lung cancer.  Pertinent  Medical History  -Severe COPD -HTN -HLD -DMII -History of skin Ca  Significant Hospital Events: Including procedures, antibiotic start and stop dates in addition to other pertinent events   12/5: Admitted by [facility].  Pulmonary consulted for severe AECOPD requiring BiPAP. 11/14/24-patient with Parainfluenza induced AECOPD. He was moved to Step down unit due to progressive  dyspnea and is on BIPAP. He is on solumedrol BID and nebulizer therapy. Will continue to follow. Anticipate slow improvement over next 3-5 weeks with this virus. Hopefully dc in 3-4days.  11/15/24: Mid morning pt with increased WOB and accessory muscle use.  PCCM consulted.  Given 40 mg Lasix  by [name].  Increase duonebs to q4h and start Ceftriaxone  and increase Azithromycin  dose to 500 mg.  High risk for intubation.  11/16/24: Acutely decompensated, became severely hypoxic (sats 60's) with increased WOB, required intubation and mechanical ventilation. Given acute decompensation and persistent sinus tachycardia, CTa obtained which is negative for PE, does show possible superimposed bacterial pneumonia.  Ceftriaxone  broadened to Zosyn . Telemetry alarming for ST elevation, obtained EKG and reviewed with Dr. Mady of Cardiology, felt not to be STEMI, obtaining Troponin and Echo. 11/17/24: No acute events overnight. Cr worsening at 1.41 this am. Remains on Zosyn . Awake and alert this am, transitioned to SBT but failed quickly. Became tachycardic and hypertensive. Restarted sedation.  11/18/24: No acute overnight events. Remains critically ill requiring mechanical ventilation. Will perform another SBT today with precedex  ~ FAILED SBT (tachycardia, agitation) 11/19/24: No acute events overnight.  Afebrile, hemodynamically stable, off vasopressors.  On minimal vent support, failed SBT due to increased WOB/tachypnea with HR 150's.  Transition Fentanyl  gtt to Dilaudid  gtt.   11/20/2024: tolerated PCV and PSV throughout the day, increased work of breathing at night with worsening acidemia and hypercapnia.   Interim History / Subjective:  Remains sedated and intubated, ABG improved.  Objective    Blood pressure 101/66, pulse (!) 129, temperature 98.6 F (37 C), temperature source Axillary, resp. rate ROLLEN)  22, height 5' 6 (1.676 m), weight 71.2 kg, SpO2 93%.    Vent Mode: PCV FiO2 (%):  [55 %-100 %] 60 % Set Rate:   [15 bmp-24 bmp] 24 bmp PEEP:  [5 cmH20-10 cmH20] 8 cmH20 Pressure Support:  [10 cmH20-16 cmH20] 15 cmH20   Intake/Output Summary (Last 24 hours) at 11/21/2024 1034 Last data filed at 11/21/2024 1000 Gross per 24 hour  Intake 2554.69 ml  Output 1635 ml  Net 919.69 ml   Filed Weights   11/19/24 0330 11/20/24 0423 11/21/24 0330  Weight: 71.8 kg 73.6 kg 71.2 kg    Examination: Physical Exam Constitutional:      General: He is not in acute distress.    Appearance: He is ill-appearing.  Cardiovascular:     Rate and Rhythm: Regular rhythm. Tachycardia present.     Pulses: Normal pulses.     Heart sounds: Normal heart sounds.  Pulmonary:     Comments: Ventilated breath sounds bilaterally Musculoskeletal:     Right lower leg: No edema.     Left lower leg: No edema.  Neurological:     Mental Status: He is alert. He is disoriented.     Comments: Sedated, ventilated      Assessment and Plan   68 year old male with history of COPD admitted with acute exacerbation of COPD resulting in acute on chronic hypoxic and hypercapnic respiratory failure. His respiratory status worsened and he was intubated and mechanically ventilated.   #Acute on Chronic Hypoxic and Hypercapnic Respiratory Failure #COPD Exacerbation #Aspiration Pneumonia  #E. Faecalis Pneumonia #Parainfluenza infection #AKI - improving   Neuro - sedation with propofol  gtt and fentanyl  for RASS of -1 and CPOT < 2. D/c versed . Switch fentanyl  to hydromorphone  for fear of tachyphylaxis. Failed Dexmedetomidine  attempt, discontinued. Now back on Propofol  and hydromorphone . Suspect a large component of his sedation requirements are due to his vent dyssynchrony. Ventilator setting adjustments as below. Daily WUA, current barrier is his work of breathing and respiratory status. CV - TTE ordered with severely elevated PA pressures, likely secondary to increased PVR due to hypoxia. PE ruled out with CT/PE. He was on vasopressors for  MAP > 65. He was diuresed this admission and we will hold off on further diuretics for now. Pulm - respiratory failure is secondary to COPD exacerbation, initially with parainfluenza infection, and subsequently worsened by superimposed bacterial infection. Antibiotic coverage broadened and he remains on steroids and nebs. He is intubated with PCV (20/5, 0.4, RR 22) and is exhibiting ventilator dyssynchrony (double triggering or reverse triggering). Previously, his ABG was within normal, but he developed respiratory acidosis due to CO2 retention, with improvement following aggressive vent changes. Respiratory cultures growing E. Faecalis, on antibiotics. Steroids tapered. GI - tube feeds started. on PPI for SUP Renal - AKI on presentation is stable. Mild increase in creatinine which could represent further kidney injury from contrast. He received IV fluids to guard against contrast induced nephropathy, will hold of further IV fluids given elevated PA pressures. Endo - methylpred 40 mg tapered down to once daily. ICU glycemic protocol Hem/Onc - lovenox  for DVT prophylaxis ID - respiratory cultures growing Enterococcus that is sensitive to penicillins, will continue zosyn  and discontinue vancomycin . Respiratory cultures resent today, and we will re-adjust antibiotic regimen based on respiratory cultures.  Labs   CBC: Recent Labs  Lab 11/17/24 0315 11/18/24 0332 11/19/24 0336 11/20/24 0331 11/21/24 0315  WBC 23.5* 19.9* 15.1* 23.7* 27.3*  HGB 12.6* 11.3* 10.4* 10.9* 11.2*  HCT 41.5 37.1* 34.1* 37.2* 39.8  MCV 92.6 93.5 93.2 97.4 100.5*  PLT 219 178 163 201 187    Basic Metabolic Panel: Recent Labs  Lab 11/15/24 0636 11/16/24 0310 11/17/24 0315 11/18/24 0332 11/19/24 0336 11/20/24 0331 11/21/24 0315  NA 143 145 139 139 140 141 144  K 4.7 4.8 4.4 4.3 4.5 4.1 4.2  CL 97* 94* 95* 97* 100 100 100  CO2 37* 39* 34* 34* 32 31 33*  GLUCOSE 171* 79 193* 174* 201* 126* 167*  BUN 40* 44* 47*  57* 59* 50* 60*  CREATININE 1.17 1.20 1.41* 1.39* 1.28* 1.23 1.27*  CALCIUM  9.5 9.4 8.7* 8.7* 8.8* 9.1 9.1  MG 2.0 2.1 2.0 2.1 2.2 2.2 2.3  PHOS 3.1 3.4 2.9 3.0 3.0  --   --    GFR: Estimated Creatinine Clearance: 49.5 mL/min (A) (by C-G formula based on SCr of 1.27 mg/dL (H)). Recent Labs  Lab 11/14/24 1704 11/15/24 0636 11/15/24 1237 11/16/24 0310 11/18/24 0332 11/19/24 0336 11/20/24 0331 11/21/24 0315  PROCALCITON 0.18  --  0.20  --   --   --   --   --   WBC  --    < >  --    < > 19.9* 15.1* 23.7* 27.3*   < > = values in this interval not displayed.    Liver Function Tests: No results for input(s): AST, ALT, ALKPHOS, BILITOT, PROT, ALBUMIN  in the last 168 hours. No results for input(s): LIPASE, AMYLASE in the last 168 hours. No results for input(s): AMMONIA in the last 168 hours.  ABG    Component Value Date/Time   PHART 7.26 (L) 11/21/2024 0801   PCO2ART 83 (HH) 11/21/2024 0801   PO2ART 64 (L) 11/21/2024 0801   HCO3 37.2 (H) 11/21/2024 0801   ACIDBASEDEF 1.4 11/03/2024 1854   O2SAT 93.3 11/21/2024 0801     Coagulation Profile: No results for input(s): INR, PROTIME in the last 168 hours.  Cardiac Enzymes: No results for input(s): CKTOTAL, CKMB, CKMBINDEX, TROPONINI in the last 168 hours.  HbA1C: Hemoglobin A1C  Date/Time Value Ref Range Status  09/25/2013 03:56 AM 6.3 4.2 - 6.3 % Final    Comment:    The American Diabetes Association recommends that a primary goal of therapy should be <7% and that physicians should reevaluate the treatment regimen in patients with HbA1c values consistently >8%.    Hgb A1c MFr Bld  Date/Time Value Ref Range Status  07/16/2024 03:19 AM 6.0 (H) 4.8 - 5.6 % Final    Comment:    (NOTE)         Prediabetes: 5.7 - 6.4         Diabetes: >6.4         Glycemic control for adults with diabetes: <7.0   10/17/2023 04:51 AM 5.7 (H) 4.8 - 5.6 % Final    Comment:    (NOTE) Pre diabetes:           5.7%-6.4%  Diabetes:              >6.4%  Glycemic control for   <7.0% adults with diabetes     CBG: Recent Labs  Lab 11/20/24 1544 11/20/24 1940 11/21/24 0000 11/21/24 0325 11/21/24 0757  GLUCAP 189* 137* 138* 154* 215*    Review of Systems:   N/A  Past Medical History:  He,  has a past medical history of Anemia, Anxiety, Arthritis, Asthma, Cancer (HCC), Chronic back pain, COPD (chronic obstructive pulmonary disease) (  HCC), Depression, Diabetes mellitus (HCC), Dyspnea, GERD (gastroesophageal reflux disease), Gout, Gout, Headache, History of blood clots, History of kidney stones, Hyperlipidemia, Hyperlipidemia, Hypertension, Kidney stones, Neuropathy, On home oxygen  therapy, Pneumonia (06/2017), Sleep apnea, and Ulcer of foot (HCC).   Surgical History:   Past Surgical History:  Procedure Laterality Date   APPENDECTOMY     DG FEET 2 VIEWS BILAT     IRRIGATION AND DEBRIDEMENT FOOT Left 07/11/2023   Procedure: IRRIGATION AND DEBRIDEMENT FOOT;  Surgeon: Ashley Soulier, DPM;  Location: [facility] ORS;  Service: Orthopedics/Podiatry;  Laterality: Left;   LIPOMA EXCISION Right 08/15/2017   Procedure: EXCISION TUMOR(CYST) FOOT;  Surgeon: Lilli Cough, DPM;  Location: [facility] ORS;  Service: Podiatry;  Laterality: Right;   LOWER EXTREMITY ANGIOGRAPHY Left 07/03/2023   Procedure: Lower Extremity Angiography;  Surgeon: Marea Selinda RAMAN, MD;  Location: [facility] INV[facility]IVE [facility];  Service: Cardiovascular;  Laterality: Left;   OTHER SURGICAL HISTORY Bilateral Foot surgery     Social History:   reports that he has quit smoking. His smoking use included cigarettes. He started smoking about 7 years ago. He has a 4 pack-year smoking history. He has never used smokeless tobacco. He reports current alcohol  use of about 1.0 standard drink of alcohol  per week. He reports that he does not use drugs.   Family History:  His family history includes Hypertension in his mother.   Allergies Allergies[1]   Home  Medications  Prior to Admission medications  Medication Sig Start Date End Date Taking? Authorizing Provider  acetaminophen  (TYLENOL ) 325 MG tablet Take 650 mg by mouth every 6 (six) hours as needed.   Yes [provider]  albuterol  (VENTOLIN  HFA) 108 (90 Base) MCG/ACT inhaler Inhale 2 puffs into the lungs every 4 (four) hours as needed for wheezing or shortness of breath.   Yes [provider]  allopurinol  (ZYLOPRIM ) 300 MG tablet Take 600 mg by mouth daily. 02/24/20  Yes [provider]  aspirin  EC 81 MG tablet Take 1 tablet by mouth daily.   Yes [provider]  azelastine  (ASTELIN ) 0.1 % nasal spray Place 2 sprays into both nostrils 2 (two) times daily as needed. 05/27/23  Yes [provider]  Buprenorphine  HCl-Naloxone  HCl 8-2 MG FILM Place 1 Film under the tongue in the morning, at noon, and at bedtime.   Yes [provider]  clopidogrel  (PLAVIX ) 75 MG tablet Take 1 tablet (75 mg total) by mouth daily. 02/02/21  Yes Leotis Bogus, MD  dapagliflozin  propanediol (FARXIGA ) 10 MG TABS tablet Take 1 tablet by mouth daily. 03/16/24 03/16/25 Yes [provider]  DULoxetine  (CYMBALTA ) 60 MG capsule Take 90 mg by mouth daily.   Yes [provider]  ezetimibe  (ZETIA ) 10 MG tablet Take 10 mg by mouth daily. 02/24/20  Yes [provider]  fluticasone  (FLONASE ) 50 MCG/ACT nasal spray Place 1 spray into both nostrils daily as needed for allergies. 10/16/18  Yes [provider]  Fluticasone -Umeclidin-Vilant (TRELEGY ELLIPTA) 100-62.5-25 MCG/ACT AEPB Inhale 1 puff into the lungs in the morning. 09/12/21  Yes [provider]  furosemide  (LASIX ) 20 MG tablet Take 1 tablet (20 mg total) by mouth daily. 02/23/24  Yes Jens Durand, MD  gabapentin  (NEURONTIN ) 800 MG tablet Take 800 mg by mouth 4 (four) times daily. 10/22/23  Yes [provider]  ipratropium-albuterol  (DUONEB) 0.5-2.5 (3) MG/3ML SOLN Take 3 mLs by  nebulization every 6 (six) hours as needed (wheezing).   Yes [provider]  metFORMIN  (GLUCOPHAGE ) 1000  MG tablet Take 1,000 mg by mouth daily.   Yes [provider]  metoprolol  succinate (TOPROL -XL) 50 MG 24 hr tablet Take 50 mg by mouth daily. 02/24/20  Yes [provider]  Multiple Vitamins-Minerals (MULTIVITAMIN ADULT, MINERALS, PO) Take 1 tablet by mouth every morning.   Yes [provider]  naloxone  (NARCAN ) nasal spray 4 mg/0.1 mL Place 1 spray into the nose once.   Yes [provider]  OHTUVAYRE  3 MG/2.5ML SUSP Take 5 mLs by mouth daily. 07/05/24  Yes [provider]  ondansetron  (ZOFRAN -ODT) 4 MG disintegrating tablet Take 4 mg by mouth every 8 (eight) hours as needed for nausea.   Yes [provider]  OXYGEN  Inhale 4 L into the lungs.   Yes [provider]  predniSONE  (DELTASONE ) 20 MG tablet 60mg  daily x 2 days, 40mg  daily x 2 days, 20mg  daily x 2 days then stop. 11/07/24  Yes Trudy Anthony HERO, MD  roflumilast  (DALIRESP ) 500 MCG TABS tablet Take 500 mcg by mouth daily. 08/15/21  Yes [provider]  simvastatin  (ZOCOR ) 40 MG tablet Take 40 mg by mouth at bedtime. 01/17/16  Yes [provider]  Syringe/Needle, Disp, (SYRINGE 3CC/25GX1) 25G X 1 3 ML MISC 1 Syringe by Does not apply route every 30 (thirty) days. 07/12/22  Yes Brahmanday, Govinda R, MD  traZODone  (DESYREL ) 50 MG tablet Take 50 mg by mouth at bedtime as needed for sleep. 01/22/24 01/21/25 Yes [provider]     The patient is critically ill due to acute on chronic hypoxic and hypercapnic respiratory failure, aspiration pneumonia, COPD exacerbation.  Critical care was necessary to treat or prevent imminent or life-threatening deterioration. I personally performed high risk medication and infusion titration and management, titration, monitoring, and management of vasopressor/ionotrope infusion, mechanical ventilation management and  titration, and blood gas interpretation. Critical care time was spent by me on the following activities: development of a treatment plan with the patient and/or surrogate as well as nursing, discussions with consultants, evaluation of the patient's response to treatment, examination of the patient, obtaining a history from the patient or surrogate, ordering and performing treatments and interventions, ordering and review of laboratory studies, ordering and review of radiographic studies, review of telemetry data including pulse oximetry, re-evaluation of patient's condition and participation in multidisciplinary rounds.   I personally spent 40 minutes providing critical care not including any separately billable procedures.   Belva November, MD [facility] 11/21/2024 2:26 PM               [1]  Allergies Allergen Reactions   Bee Venom Anaphylaxis   Linezolid  Other (See Comments)    Thrombocytopenia, hyperlactatemia   Azithromycin  Itching and Rash

## 2024-11-21 NOTE — Plan of Care (Signed)
°  Problem: Education: Goal: Knowledge of General Education information will improve Description: Including pain rating scale, medication(s)/side effects and non-pharmacologic comfort measures Outcome: Not Progressing   Problem: Health Behavior/Discharge Planning: Goal: Ability to manage health-related needs will improve Outcome: Not Progressing   Problem: Clinical Measurements: Goal: Ability to maintain clinical measurements within normal limits will improve Outcome: Not Progressing Goal: Will remain free from infection Outcome: Not Progressing Goal: Diagnostic test results will improve Outcome: Progressing Goal: Respiratory complications will improve Outcome: Progressing Goal: Cardiovascular complication will be avoided Outcome: Progressing   Problem: Activity: Goal: Risk for activity intolerance will decrease Outcome: Not Progressing   Problem: Nutrition: Goal: Adequate nutrition will be maintained Outcome: Progressing   Problem: Coping: Goal: Level of anxiety will decrease Outcome: Not Progressing   Problem: Elimination: Goal: Will not experience complications related to bowel motility Outcome: Progressing Goal: Will not experience complications related to urinary retention Outcome: Not Progressing   Problem: Pain Managment: Goal: General experience of comfort will improve and/or be controlled Outcome: Progressing   Problem: Safety: Goal: Ability to remain free from injury will improve Outcome: Progressing   Problem: Skin Integrity: Goal: Risk for impaired skin integrity will decrease Outcome: Progressing   Problem: Education: Goal: Knowledge of disease or condition will improve Outcome: Not Progressing Goal: Knowledge of the prescribed therapeutic regimen will improve Outcome: Not Progressing   Problem: Activity: Goal: Ability to tolerate increased activity will improve Outcome: Not Progressing Goal: Will verbalize the importance of balancing activity  with adequate rest periods Outcome: Not Progressing   Problem: Respiratory: Goal: Ability to maintain a clear airway will improve Outcome: Progressing Goal: Levels of oxygenation will improve Outcome: Progressing Goal: Ability to maintain adequate ventilation will improve Outcome: Progressing   Problem: Education: Goal: Ability to describe self-care measures that may prevent or decrease complications (Diabetes Survival Skills Education) will improve Outcome: Not Progressing   Problem: Coping: Goal: Ability to adjust to condition or change in health will improve Outcome: Not Progressing   Problem: Fluid Volume: Goal: Ability to maintain a balanced intake and output will improve Outcome: Progressing   Problem: Health Behavior/Discharge Planning: Goal: Ability to identify and utilize available resources and services will improve Outcome: Not Progressing Goal: Ability to manage health-related needs will improve Outcome: Not Progressing   Problem: Nutritional: Goal: Maintenance of adequate nutrition will improve Outcome: Progressing Goal: Progress toward achieving an optimal weight will improve Outcome: Progressing   Problem: Skin Integrity: Goal: Risk for impaired skin integrity will decrease Outcome: Progressing   Problem: Tissue Perfusion: Goal: Adequacy of tissue perfusion will improve Outcome: Progressing   Problem: Activity: Goal: Ability to tolerate increased activity will improve Outcome: Progressing   Problem: Respiratory: Goal: Ability to maintain a clear airway and adequate ventilation will improve Outcome: Progressing   Problem: Role Relationship: Goal: Method of communication will improve Outcome: Not Progressing

## 2024-11-21 NOTE — Progress Notes (Signed)
 PHARMACY CONSULT NOTE  Pharmacy Consult for Electrolyte Monitoring and Replacement   Recent Labs: Potassium (mmol/L)  Date Value  11/21/2024 4.2  05/17/2014 3.5   Magnesium  (mg/dL)  Date Value  87/85/7974 2.3   Calcium  (mg/dL)  Date Value  87/85/7974 9.1   Calcium , Total (mg/dL)  Date Value  95/76/7984 9.3   Albumin  (g/dL)  Date Value  87/93/7974 3.7  09/24/2013 3.6   Phosphorus (mg/dL)  Date Value  87/87/7974 3.0   Sodium (mmol/L)  Date Value  11/21/2024 144  03/31/2014 136    Assessment:  68 y.o. male admitted on 11/12/2024 with sepsis and acute hypoxic on chronic hypercarbic respiratory failure. PMH significant for COPD on home oxygen  4 L, non-small cell lung cancer s/p RXT, OSA on CPAP, GERD, history of TIA, asthma, diabetes type 2, HLD, HTN, and CKD-3a. Patient was also recently admitted from 11/26-11/30 for COPD exacerbation. Pharmacy has been consulted for replacing electrolytes  Nutrition: Vital AF at 30 mL/hr + FWF 30 mL every 4h  Goal of Therapy:  Electrolytes WNL  Plan:  --No electrolyte replacement warranted for today --Recheck electrolytes as ordered by PCCM team  Marolyn KATHEE Mare 11/21/2024 7:04 AM

## 2024-11-21 NOTE — Plan of Care (Signed)

## 2024-11-21 NOTE — Procedures (Signed)
 ARTERIAL CATHETER INSERTION PROCEDURE NOTE  Christian Fields  [id number]  1956/05/17  Date:11/21/2024  Time:6:58 AM   Provider Performing: Almarie DELENA Nose   Procedure: Insertion of Arterial Line (63379) with US  guidance (23062)   Indication(s) Blood pressure monitoring and/or need for frequent ABGs  Consent Unable to obtain consent due to emergent nature of procedure.  Anesthesia None  Time Out Verified patient identification, verified procedure, site/side was marked, verified correct patient position, special equipment/implants available, medications/allergies/relevant history reviewed, required imaging and test results available.  Sterile Technique Maximal sterile technique including full sterile barrier drape, hand hygiene, sterile gown, sterile gloves, mask, hair covering, sterile ultrasound probe cover (if used).  Procedure Description Area of catheter insertion was cleaned with chlorhexidine  and draped in sterile fashion. With real-time ultrasound guidance an arterial catheter was placed into the right radial artery.  Appropriate arterial tracings confirmed on monitor.    Complications/Tolerance None; patient tolerated the procedure well.  EBL Minimal  Specimen(s) None    Almarie Nose DNP, CCRN, FNP-C, AGACNP-BC Acute Care & Family Nurse Practitioner [facility] PCCM on call pager [phone]

## 2024-11-22 ENCOUNTER — Inpatient Hospital Stay

## 2024-11-22 DIAGNOSIS — J111 Influenza due to unidentified influenza virus with other respiratory manifestations: Secondary | ICD-10-CM

## 2024-11-22 DIAGNOSIS — E119 Type 2 diabetes mellitus without complications: Secondary | ICD-10-CM

## 2024-11-22 DIAGNOSIS — C3490 Malignant neoplasm of unspecified part of unspecified bronchus or lung: Secondary | ICD-10-CM

## 2024-11-22 DIAGNOSIS — J159 Unspecified bacterial pneumonia: Secondary | ICD-10-CM

## 2024-11-22 LAB — BASIC METABOLIC PANEL WITH GFR
Anion gap: 8 (ref 5–15)
BUN: 69 mg/dL — ABNORMAL HIGH (ref 8–23)
CO2: 34 mmol/L — ABNORMAL HIGH (ref 22–32)
Calcium: 9.5 mg/dL (ref 8.9–10.3)
Chloride: 103 mmol/L (ref 98–111)
Creatinine, Ser: 1.42 mg/dL — ABNORMAL HIGH (ref 0.61–1.24)
GFR, Estimated: 53 mL/min — ABNORMAL LOW (ref >60)
Glucose, Bld: 195 mg/dL — ABNORMAL HIGH (ref 70–99)
Potassium: 4.1 mmol/L (ref 3.5–5.1)
Sodium: 146 mmol/L — ABNORMAL HIGH (ref 135–145)

## 2024-11-22 LAB — BLOOD GAS, ARTERIAL
Acid-Base Excess: 6.6 mmol/L — ABNORMAL HIGH (ref 0.0–2.0)
Bicarbonate: 36.8 mmol/L — ABNORMAL HIGH (ref 20.0–28.0)
FIO2: 70 %
Mechanical Rate: 25
O2 Saturation: 96.2 %
PEEP: 8 cmH2O
Patient temperature: 37
Pressure control: 25 cmH2O
pCO2 arterial: 82 mmHg (ref 32–48)
pH, Arterial: 7.26 — ABNORMAL LOW (ref 7.35–7.45)
pO2, Arterial: 72 mmHg — ABNORMAL LOW (ref 83–108)

## 2024-11-22 LAB — CULTURE, RESPIRATORY W GRAM STAIN

## 2024-11-22 LAB — CBC
HCT: 36.1 % — ABNORMAL LOW (ref 39.0–52.0)
Hemoglobin: 10.3 g/dL — ABNORMAL LOW (ref 13.0–17.0)
MCH: 28.7 pg (ref 26.0–34.0)
MCHC: 28.5 g/dL — ABNORMAL LOW (ref 30.0–36.0)
MCV: 100.6 fL — ABNORMAL HIGH (ref 80.0–100.0)
Platelets: 147 K/uL — ABNORMAL LOW (ref 150–400)
RBC: 3.59 MIL/uL — ABNORMAL LOW (ref 4.22–5.81)
RDW: 15.9 % — ABNORMAL HIGH (ref 11.5–15.5)
WBC: 22 K/uL — ABNORMAL HIGH (ref 4.0–10.5)
nRBC: 0 % (ref 0.0–0.2)

## 2024-11-22 LAB — GLUCOSE, CAPILLARY
Glucose-Capillary: 183 mg/dL — ABNORMAL HIGH (ref 70–99)
Glucose-Capillary: 212 mg/dL — ABNORMAL HIGH (ref 70–99)
Glucose-Capillary: 278 mg/dL — ABNORMAL HIGH (ref 70–99)
Glucose-Capillary: 203 mg/dL — ABNORMAL HIGH (ref 70–99)
Glucose-Capillary: 145 mg/dL — ABNORMAL HIGH (ref 70–99)
Glucose-Capillary: 134 mg/dL — ABNORMAL HIGH (ref 70–99)

## 2024-11-22 LAB — TROPONIN T, HIGH SENSITIVITY
Troponin T High Sensitivity: 56 ng/L — ABNORMAL HIGH (ref 0–19)
Troponin T High Sensitivity: 54 ng/L — ABNORMAL HIGH (ref 0–19)

## 2024-11-22 LAB — MAGNESIUM: Magnesium: 2.4 mg/dL (ref 1.7–2.4)

## 2024-11-22 MED ORDER — VECURONIUM BROMIDE 10 MG IV SOLR
INTRAVENOUS | Status: AC
  Administered 2024-11-22: 21:00:00 10 mg via INTRAVENOUS
  Filled 2024-11-22: qty 10

## 2024-11-22 MED ORDER — MIDAZOLAM HCL (PF) 2 MG/2ML IJ SOLN: 2.0000 mg | Freq: Once | INTRAMUSCULAR | Status: AC

## 2024-11-22 MED ORDER — FREE WATER
200.0000 mL | Status: DC
  Administered 2024-11-22 – 2024-11-25 (×18): 200 mL

## 2024-11-22 MED ORDER — VECURONIUM BROMIDE 10 MG IV SOLR: 10.0000 mg | Freq: Once | INTRAVENOUS | Status: AC

## 2024-11-22 MED ORDER — MIDAZOLAM HCL 2 MG/2ML IJ SOLN
INTRAMUSCULAR | Status: AC
  Administered 2024-11-22: 21:00:00 2 mg via INTRAVENOUS
  Filled 2024-11-22: qty 2

## 2024-11-22 NOTE — Plan of Care (Signed)

## 2024-11-22 NOTE — Progress Notes (Signed)
 NAME:  Christian Fields, MRN:  [id number], DOB:  [birth year], LOS: 10 ADMISSION DATE:  11/12/2024, CONSULTATION DATE:  11/15/2024 REFERRING MD:  Dr. Awanda, CHIEF COMPLAINT:  Acute Respiratory Distress   Brief Pt Description / Synopsis:  68 y.o. male with PMHx significant for COPD and lung cancer admitted with Acute on Chronic Hypoxic and Hypercapnic Respiratory Failure due to Acute COPD Exacerbation due to Parainfluenza Infection requiring BiPAP.  Failed trial of BiPAP on 11/16/24 requiring intubation and mechanical ventilation.   History of Present Illness:  Christian Fields is a 68 year old male with a history of lifelong smoking and advanced COPD, had a diagnosis of stage I left upper lobe lung cancer after noticing an interval increase in the size of nodule from 1 cm to 1.4 in 2021. He went straight to SBRT due to being a poor surgical candidate for biopsy. He also is having recurrent COPD exacerbations and having rehospitalization for this. He has chronic hypoxemia and uses oxygen  at home up to 5 L/min. He has chronic physical deconditioning. He has chronic peripheral edema and also follows up with cardiology for essential hypertension and dyslipidemia. He also has a background history of CKD 3 A, sleep apnea noncompliant with CPAP therapy, polysubstance abuse and abdominal aortic aneurysm. He came to the hospital on this admission with complaints of worsening shortness of breath x 1 day was noted to have oxygen  saturation approximately 60% on his usual 4 L/min nasal cannula he denies sick contacts or fevers at home. He had a venous blood gas performed in the ED which showed mild acidemia with hypercapnia. He was treated with nebulizer therapy and steroids as well as BiPAP and reports slight improvement since then. He is started on empiric antibiotics with cefepime  and vancomycin  for possible pneumonia. PCCM consultation placed due to recurrent hospitalization with advanced COPD, chronic hypoxemia and lung  cancer.    Please see Significant Hospital Events section below for full detailed hospital course.   Pertinent  Medical History   Past Medical History:  Diagnosis Date   Anemia    Anxiety    Arthritis    Asthma    Cancer (HCC)    Basal Cell Skin Cancer   Chronic back pain    COPD (chronic obstructive pulmonary disease) (HCC)    Depression    Diabetes mellitus (HCC)    Dyspnea    GERD (gastroesophageal reflux disease)    Gout    Gout    Headache    History of blood clots    Left Leg--July 2018   History of kidney stones    Hyperlipidemia    Hyperlipidemia    Hypertension    Kidney stones    Neuropathy    On home oxygen  therapy    2 L / M   Pneumonia 06/2017   Sleep apnea    Ulcer of foot (HCC)    Right    Micro Data:  12/5: COVID/FLU/RSV PCR>> negative 12/5: RVP >> + Parainfluenza  12/5: MRSA PCR>> negative  12/9: Tracheal aspirate>> + enterococcus faecalis 12/9: Strep pneumo urinary antigen>>negative 12/9: Legionella urinary antigen>>  Antimicrobials:   Anti-infectives (From admission, onward)    Start     Dose/Rate Route Frequency Ordered Stop   11/19/24 0800  vancomycin  (VANCOCIN ) IVPB 1000 mg/200 mL premix  Status:  Discontinued        1,000 mg 200 mL/hr over 60 Minutes Intravenous Every 24 hours 11/18/24 1253 11/19/24 1036   11/18/24 1300  vancomycin  (VANCOREADY) IVPB  1250 mg/250 mL        1,250 mg 166.7 mL/hr over 90 Minutes Intravenous  Once 11/18/24 1129 11/18/24 1333   11/16/24 1200  piperacillin -tazobactam (ZOSYN ) IVPB 3.375 g        3.375 g 12.5 mL/hr over 240 Minutes Intravenous Every 8 hours 11/16/24 1110     11/15/24 1215  azithromycin  (ZITHROMAX ) 500 mg in sodium chloride  0.9 % 250 mL IVPB  Status:  Discontinued        500 mg 250 mL/hr over 60 Minutes Intravenous Every 24 hours 11/15/24 1129 11/18/24 1016   11/15/24 1215  cefTRIAXone  (ROCEPHIN ) 2 g in sodium chloride  0.9 % 100 mL IVPB  Status:  Discontinued        2 g 200 mL/hr over  30 Minutes Intravenous Every 24 hours 11/15/24 1129 11/16/24 1030   11/13/24 1900  vancomycin  (VANCOREADY) IVPB 1250 mg/250 mL  Status:  Discontinued        1,250 mg 166.7 mL/hr over 90 Minutes Intravenous Every 24 hours 11/12/24 1744 11/12/24 1749   11/13/24 1000  azithromycin  (ZITHROMAX ) tablet 250 mg  Status:  Discontinued        250 mg Oral Daily 11/13/24 0819 11/15/24 1129   11/13/24 0500  piperacillin -tazobactam (ZOSYN ) IVPB 3.375 g  Status:  Discontinued        3.375 g 12.5 mL/hr over 240 Minutes Intravenous Every 8 hours 11/12/24 2112 11/13/24 0814   11/12/24 1900  vancomycin  (VANCOREADY) IVPB 1750 mg/350 mL  Status:  Discontinued        1,750 mg 175 mL/hr over 120 Minutes Intravenous  Once 11/12/24 1744 11/12/24 1749   11/12/24 1700  ceFEPIme  (MAXIPIME ) 2 g in sodium chloride  0.9 % 100 mL IVPB  Status:  Discontinued        2 g 200 mL/hr over 30 Minutes Intravenous Every 12 hours 11/12/24 1615 11/12/24 1745       Significant Hospital Events: Including procedures, antibiotic start and stop dates in addition to other pertinent events   12/5: Admitted by [facility].  Pulmonary consulted for severe AECOPD requiring BiPAP. 11/14/24-patient with Parainfluenza induced AECOPD. He was moved to Step down unit due to progressive dyspnea and is on BIPAP. He is on solumedrol BID and nebulizer therapy. Will continue to follow. Anticipate slow improvement over next 3-5 weeks with this virus. Hopefully dc in 3-4days.  11/15/24: Mid morning pt with increased WOB and accessory muscle use.  PCCM consulted.  Given 40 mg Lasix  by [name].  Increase duonebs to q4h and start Ceftriaxone  and increase Azithromycin  dose to 500 mg.  High risk for intubation.  11/16/24: Acutely decompensated, became severely hypoxic (sats 60's) with increased WOB, required intubation and mechanical ventilation. Given acute decompensation and persistent sinus tachycardia, CTa obtained which is negative for PE, does show possible superimposed  bacterial pneumonia.  Ceftriaxone  broadened to Zosyn . Telemetry alarming for ST elevation, obtained EKG and reviewed with Dr. Mady of Cardiology, felt not to be STEMI, obtaining Troponin and Echo. 11/17/24: No acute events overnight. Cr worsening at 1.41 this am. Remains on Zosyn . Awake and alert this am, transitioned to SBT but failed quickly. Became tachycardic and hypertensive. Restarted sedation.  11/18/24: No acute overnight events. Remains critically ill requiring mechanical ventilation. Will perform another SBT today with precedex .  11/19/24 11/20/24: 11/21/24: 11/22/24: Remains critically ill requiring mechanical ventilation. On dilaudid  and propofol  gtt, 3mcg of levo. Worsening hypercapnia overnight requiring vecuronium  administration and versed .. Repeat ABG pending.  Interim History / Subjective:  As outlined above under Significant Hospital Events section  Objective   Blood pressure 91/60, pulse (!) 123, temperature 98.4 F (36.9 C), temperature source Axillary, resp. rate 16, height 5' 6 (1.676 m), weight 71.2 kg, SpO2 92%.    Vent Mode: PCV FiO2 (%):  [60 %-70 %] 70 % Set Rate:  [22 bmp-25 bmp] 25 bmp PEEP:  [8 cmH20] 8 cmH20 Plateau Pressure:  [23 cmH20] 23 cmH20   Intake/Output Summary (Last 24 hours) at 11/22/2024 0735 Last data filed at 11/22/2024 9377 Gross per 24 hour  Intake 3032.43 ml  Output 2165 ml  Net 867.43 ml   Filed Weights   11/20/24 0423 11/21/24 0330 11/22/24 0500  Weight: 73.6 kg 71.2 kg 71.2 kg    Examination: General: Acute on chronically ill appearing male, intubated and sedated, in NAD HENT: Atraumatic, normocephalic, neck supple, no JVD Lungs: mildly coarse breath sounds throughout (R>L) with expiratory wheezing, synchronous with vent  Cardiovascular: Tachycardia, Regular rhythm, s1s2, no M/R/G Abdomen: Soft, nontender, nondistended, no rebound/guarding, bowel sounds x 4 Extremities: Normal bulk and tone, no deformities, warm and well  perfused, no edema Neuro: intubated and sedated but follows commands, wakes up easily, no focal neuro deficits, PERRL  GU: Foley catheter in place draining yellow urine   Resolved Hospital Problem list     Assessment & Plan:   #Sedation needs in setting of mechanical ventilation #Anxiety Hx: Depression. Anxiety, sleep apnea - Maintain a RASS goal of 0 to -1 - Dilaudid  & Propofol  gtt to maintain RASS goal - Required vecuronium  and versed  overnight - Wean sedating medications as able - Control pain as able - Daily WUA  #Acute on chronic Hypoxic & Hypercapnic Respiratory Failure #Acute COPD Exacerbation d/t Parainfluenza Viral Infection and Aspiration PNA #Superimposed Bacterial Pneumonia (+ Enterococcus Faecalis) #Lung Cancer Hx: COPD on home oxygen , Asthma  CTa Chest PE 12/9: negative for PE, new multifocal airspace consolidation in RLL, RML, and LLL, stable 2.6 cm pleural based mass like consolidation in lateral LUL, enlarged central pulmonary arteries suggesting pulmonary artery hypertension  - Full vent support - Implement lung protective strategies - Plateau pressures less than 30 cm H20 - Wean FiO2 & PEEP as tolerated to maintain O2 sats 88 to 92% - Received vecuronium  overnight d/t worsening respiratory distress and pCO2 in 120s - Follow intermittent Chest X-ray & ABG as needed - SBT when respiratory parameters met and mental status permits - Implement VAP Bundle - Duonebs Q4h - Continue IV Solumedrol 40 mg BID - ABX: Zosyn  - Diuresis as BP and renal function permits ~ Hold lasix   - Pulmonary toilet as able  #Tachycardia, likely compensatory in nature d/t respiratory failure #Mildly Elevated Troponin, suspect demand ischemia  Hx: HTN, HLD 12/9: Telemetry alarming ST elevation, EKG with ST elevation in inferior leads (but no reciprocal changes), reviewed with Dr. Mady, felt not to be a STEMI 11/17/24 ECHO: LVEF 60-65%, mild LVH, severely elevated pulmonary artery  pressure with mildly reduced RV systolic function; moderate thickening of aortic valve.  - Continuous cardiac monitoring - Gentle IV fluids - Vasopressors to maintain MAP goal >65 ~ on levophed  gtt at 3mcg - Continue clopidogrel  and ezetimibe   #Mild AKI - Trend BMP and monitor renal function - Creatinine this am 1.42 - Strict I/O - Avoid nephrotoxins as able - Ensure adequate renal perfusion - ICU electrolyte replacement protocol - Pharmacy to assist with replacement as indicated  #GI Prophylaxis #GERD - Continue Protonix  40mg  - Constipation protocol prn - Antiemetics  prn - Diet: Tube feeds  #Type II Diabetes Mellitus - ICU hypo/hyperglycemia protocol - SSI - Goal Range 140-180 - CBG Q4h  #Parainfluenza Viral Infection #Superimposed bacterial pneumonia  #Enterococcus Faecalis - Trend WBC and monitor fever curve - Tylenol  prn for fever; tmax overnight 103 - Procalcitonin 0.20 - Follow cultures as above - Completed course of azithromycin  - Continue Zosyn  - Narrow ABX following culture and sensitivities - Tracheal aspirate: + Enterococcus faecalis    Best Practice (right click and Reselect all SmartList Selections daily)   Diet/type: tube feeds DVT prophylaxis: LMWH GI prophylaxis: PPI Lines: right radial arterial line Foley:  yes, and still needed  Code Status:  full code Last date of multidisciplinary goals of care discussion [12/15]  12/15: Will update pt's family when they arrive at bedside.    Labs   CBC: Recent Labs  Lab 11/18/24 0332 11/19/24 0336 11/20/24 0331 11/21/24 0315 11/22/24 0332  WBC 19.9* 15.1* 23.7* 27.3* 22.0*  HGB 11.3* 10.4* 10.9* 11.2* 10.3*  HCT 37.1* 34.1* 37.2* 39.8 36.1*  MCV 93.5 93.2 97.4 100.5* 100.6*  PLT 178 163 201 187 147*    Basic Metabolic Panel: Recent Labs  Lab 11/16/24 0310 11/17/24 0315 11/18/24 0332 11/19/24 0336 11/20/24 0331 11/21/24 0315 11/22/24 0332  NA 145 139 139 140 141 144 146*  K 4.8 4.4  4.3 4.5 4.1 4.2 4.1  CL 94* 95* 97* 100 100 100 103  CO2 39* 34* 34* 32 31 33* 34*  GLUCOSE 79 193* 174* 201* 126* 167* 195*  BUN 44* 47* 57* 59* 50* 60* 69*  CREATININE 1.20 1.41* 1.39* 1.28* 1.23 1.27* 1.42*  CALCIUM  9.4 8.7* 8.7* 8.8* 9.1 9.1 9.5  MG 2.1 2.0 2.1 2.2 2.2 2.3 2.4  PHOS 3.4 2.9 3.0 3.0  --   --   --    GFR: Estimated Creatinine Clearance: 44.3 mL/min (A) (by C-G formula based on SCr of 1.42 mg/dL (H)). Recent Labs  Lab 11/15/24 1237 11/16/24 0310 11/19/24 0336 11/20/24 0331 11/21/24 0315 11/22/24 0332  PROCALCITON 0.20  --   --   --   --   --   WBC  --    < > 15.1* 23.7* 27.3* 22.0*   < > = values in this interval not displayed.    Liver Function Tests: No results for input(s): AST, ALT, ALKPHOS, BILITOT, PROT, ALBUMIN  in the last 168 hours.  No results for input(s): LIPASE, AMYLASE in the last 168 hours. No results for input(s): AMMONIA in the last 168 hours.  ABG    Component Value Date/Time   PHART 7.25 (L) 11/21/2024 1812   PCO2ART 92 (HH) 11/21/2024 1812   PO2ART 66 (L) 11/21/2024 1812   HCO3 40.3 (H) 11/21/2024 1812   ACIDBASEDEF 1.4 11/03/2024 1854   O2SAT 94.5 11/21/2024 1812     Coagulation Profile: No results for input(s): INR, PROTIME in the last 168 hours.   Cardiac Enzymes: No results for input(s): CKTOTAL, CKMB, CKMBINDEX, TROPONINI in the last 168 hours.  HbA1C: Hemoglobin A1C  Date/Time Value Ref Range Status  09/25/2013 03:56 AM 6.3 4.2 - 6.3 % Final    Comment:    The American Diabetes Association recommends that a primary goal of therapy should be <7% and that physicians should reevaluate the treatment regimen in patients with HbA1c values consistently >8%.    Hgb A1c MFr Bld  Date/Time Value Ref Range Status  07/16/2024 03:19 AM 6.0 (H) 4.8 - 5.6 % Final  Comment:    (NOTE)         Prediabetes: 5.7 - 6.4         Diabetes: >6.4         Glycemic control for adults with diabetes: <7.0    10/17/2023 04:51 AM 5.7 (H) 4.8 - 5.6 % Final    Comment:    (NOTE) Pre diabetes:          5.7%-6.4%  Diabetes:              >6.4%  Glycemic control for   <7.0% adults with diabetes     CBG: Recent Labs  Lab 11/21/24 1120 11/21/24 1619 11/21/24 1935 11/21/24 2308 11/22/24 0352  GLUCAP 224* 136* 147* 144* 183*    Review of Systems:   Unable to assess due to intubation/sedation/critical illness   Past Medical History:  He,  has a past medical history of Anemia, Anxiety, Arthritis, Asthma, Cancer (HCC), Chronic back pain, COPD (chronic obstructive pulmonary disease) (HCC), Depression, Diabetes mellitus (HCC), Dyspnea, GERD (gastroesophageal reflux disease), Gout, Gout, Headache, History of blood clots, History of kidney stones, Hyperlipidemia, Hyperlipidemia, Hypertension, Kidney stones, Neuropathy, On home oxygen  therapy, Pneumonia (06/2017), Sleep apnea, and Ulcer of foot (HCC).   Surgical History:   Past Surgical History:  Procedure Laterality Date   APPENDECTOMY     DG FEET 2 VIEWS BILAT     IRRIGATION AND DEBRIDEMENT FOOT Left 07/11/2023   Procedure: IRRIGATION AND DEBRIDEMENT FOOT;  Surgeon: Ashley Soulier, DPM;  Location: [facility] ORS;  Service: Orthopedics/Podiatry;  Laterality: Left;   LIPOMA EXCISION Right 08/15/2017   Procedure: EXCISION TUMOR(CYST) FOOT;  Surgeon: Lilli Cough, DPM;  Location: [facility] ORS;  Service: Podiatry;  Laterality: Right;   LOWER EXTREMITY ANGIOGRAPHY Left 07/03/2023   Procedure: Lower Extremity Angiography;  Surgeon: Marea Selinda RAMAN, MD;  Location: [facility] INVASIVE CV LAB;  Service: Cardiovascular;  Laterality: Left;   OTHER SURGICAL HISTORY Bilateral Foot surgery     Social History:   reports that he has quit smoking. His smoking use included cigarettes. He started smoking about 7 years ago. He has a 4 pack-year smoking history. He has never used smokeless tobacco. He reports current alcohol  use of about 1.0 standard drink of alcohol  per week. He  reports that he does not use drugs.   Family History:  His family history includes Hypertension in his mother.   Allergies Allergies  Allergen Reactions   Bee Venom Anaphylaxis   Linezolid  Other (See Comments)    Thrombocytopenia, hyperlactatemia   Azithromycin  Itching and Rash     Home Medications  Prior to Admission medications   Medication Sig Start Date End Date Taking? Authorizing Provider  acetaminophen  (TYLENOL ) 325 MG tablet Take 650 mg by mouth every 6 (six) hours as needed.   Yes [provider]  albuterol  (VENTOLIN  HFA) 108 (90 Base) MCG/ACT inhaler Inhale 2 puffs into the lungs every 4 (four) hours as needed for wheezing or shortness of breath.   Yes [provider]  allopurinol  (ZYLOPRIM ) 300 MG tablet Take 600 mg by mouth daily. 02/24/20  Yes [provider]  aspirin  EC 81 MG tablet Take 1 tablet by mouth daily.   Yes [provider]  azelastine  (ASTELIN ) 0.1 % nasal spray Place 2 sprays into both nostrils 2 (two) times daily as needed. 05/27/23  Yes [provider]  Buprenorphine  HCl-Naloxone  HCl 8-2 MG FILM Place 1 Film under the tongue in the morning, at noon, and at bedtime.   Yes  [provider]  clopidogrel  (PLAVIX ) 75 MG tablet Take 1 tablet (75 mg total) by mouth daily. 02/02/21  Yes Leotis Bogus, MD  dapagliflozin  propanediol (FARXIGA ) 10 MG TABS tablet Take 1 tablet by mouth daily. 03/16/24 03/16/25 Yes [provider]  DULoxetine  (CYMBALTA ) 60 MG capsule Take 60 mg by mouth daily.   Yes [provider]  ezetimibe  (ZETIA ) 10 MG tablet Take 10 mg by mouth daily. 02/24/20  Yes [provider]  fluticasone  (FLONASE ) 50 MCG/ACT nasal spray Place 1 spray into both nostrils daily as needed for allergies. 10/16/18  Yes [provider]  Fluticasone -Umeclidin-Vilant (TRELEGY ELLIPTA) 100-62.5-25 MCG/ACT AEPB Inhale 1 puff into the lungs in the morning. 09/12/21  Yes [provider]   furosemide  (LASIX ) 20 MG tablet Take 1 tablet (20 mg total) by mouth daily. 02/23/24  Yes Jens Durand, MD  gabapentin  (NEURONTIN ) 800 MG tablet Take 800 mg by mouth 4 (four) times daily. 10/22/23  Yes [provider]  ipratropium-albuterol  (DUONEB) 0.5-2.5 (3) MG/3ML SOLN Take 3 mLs by nebulization every 6 (six) hours as needed (wheezing).   Yes [provider]  metFORMIN  (GLUCOPHAGE ) 1000 MG tablet Take 1,000 mg by mouth daily.   Yes [provider]  metoprolol  succinate (TOPROL -XL) 50 MG 24 hr tablet Take 50 mg by mouth daily. 02/24/20  Yes [provider]  Multiple Vitamins-Minerals (MULTIVITAMIN ADULT, MINERALS, PO) Take 1 tablet by mouth every morning.   Yes [provider]  naloxone  (NARCAN ) nasal spray 4 mg/0.1 mL Place 1 spray into the nose once.   Yes [provider]  OHTUVAYRE  3 MG/2.5ML SUSP Take 5 mLs by mouth daily. 07/05/24  Yes [provider]  ondansetron  (ZOFRAN -ODT) 4 MG disintegrating tablet Take 4 mg by mouth every 8 (eight) hours as needed for nausea.   Yes [provider]  OXYGEN  Inhale 4 L into the lungs.   Yes [provider]  predniSONE  (DELTASONE ) 20 MG tablet 60mg  daily x 2 days, 40mg  daily x 2 days, 20mg  daily x 2 days then stop. 11/07/24  Yes Trudy Anthony HERO, MD  roflumilast  (DALIRESP ) 500 MCG TABS tablet Take 500 mcg by mouth daily. 08/15/21  Yes [provider]  simvastatin  (ZOCOR ) 40 MG tablet Take 40 mg by mouth at bedtime. 01/17/16  Yes [provider]  Syringe/Needle, Disp, (SYRINGE 3CC/25GX1) 25G X 1 3 ML MISC 1 Syringe by Does not apply route every 30 (thirty) days. 07/12/22  Yes Brahmanday, Govinda R, MD  traZODone  (DESYREL ) 50 MG tablet Take 50 mg by mouth at bedtime as needed for sleep. 01/22/24 01/21/25 Yes [provider]  bisacodyl  (DULCOLAX) 5 MG EC tablet Take 1 tablet (5 mg total) by mouth daily as needed for moderate constipation. Patient not  taking: Reported on 11/03/2024 09/04/23   Dorinda Drue DASEN, MD     Critical care time: 45 minutes    Robet Kim, PA-C [facility] PCCM Team Contact Info: [phone]

## 2024-11-22 NOTE — Progress Notes (Signed)
 PHARMACY CONSULT NOTE  Pharmacy Consult for Electrolyte Monitoring and Replacement   Recent Labs: Potassium (mmol/L)  Date Value  11/22/2024 4.1  05/17/2014 3.5   Magnesium  (mg/dL)  Date Value  87/84/7974 2.4   Calcium  (mg/dL)  Date Value  87/84/7974 9.5   Calcium , Total (mg/dL)  Date Value  95/76/7984 9.3   Albumin  (g/dL)  Date Value  87/93/7974 3.7  09/24/2013 3.6   Phosphorus (mg/dL)  Date Value  87/87/7974 3.0   Sodium (mmol/L)  Date Value  11/22/2024 146 (H)  03/31/2014 136    Assessment:  68 y.o. male admitted on 11/12/2024 with sepsis and acute hypoxic on chronic hypercarbic respiratory failure. PMH significant for COPD on home oxygen  4 L, non-small cell lung cancer s/p RXT, OSA on CPAP, GERD, history of TIA, asthma, diabetes type 2, HLD, HTN, and CKD-3a. Patient was also recently admitted from 11/26-11/30 for COPD exacerbation. Pharmacy has been consulted for replacing electrolytes  Nutrition: Vital AF at 30 mL/hr + FWF 30 mL every 4h  Goal of Therapy:  Electrolytes WNL  Plan:  --ISO hypernatremia will increase free water  to 200 mL every 4 hours --Recheck electrolytes as ordered by PCCM team  [name] 11/22/2024 7:08 AM

## 2024-11-22 NOTE — Progress Notes (Signed)
 ETT became dislodged, RN called RT to bedside, NP pushed ETT back in placed, secured at 23cm, CXR pending.

## 2024-11-22 NOTE — Plan of Care (Signed)
°  Problem: Clinical Measurements: Goal: Respiratory complications will improve Outcome: Progressing Goal: Cardiovascular complication will be avoided Outcome: Progressing   Problem: Nutrition: Goal: Adequate nutrition will be maintained Outcome: Progressing   Problem: Pain Managment: Goal: General experience of comfort will improve and/or be controlled Outcome: Progressing   Problem: Skin Integrity: Goal: Risk for impaired skin integrity will decrease Outcome: Progressing   Problem: Education: Goal: Knowledge of General Education information will improve Description: Including pain rating scale, medication(s)/side effects and non-pharmacologic comfort measures Outcome: Not Progressing   Problem: Health Behavior/Discharge Planning: Goal: Ability to manage health-related needs will improve Outcome: Not Progressing

## 2024-11-23 ENCOUNTER — Inpatient Hospital Stay

## 2024-11-23 DIAGNOSIS — I120 Hypertensive chronic kidney disease with stage 5 chronic kidney disease or end stage renal disease: Secondary | ICD-10-CM

## 2024-11-23 DIAGNOSIS — N1831 Chronic kidney disease, stage 3a: Secondary | ICD-10-CM

## 2024-11-23 DIAGNOSIS — K219 Gastro-esophageal reflux disease without esophagitis: Secondary | ICD-10-CM

## 2024-11-23 DIAGNOSIS — E1122 Type 2 diabetes mellitus with diabetic chronic kidney disease: Secondary | ICD-10-CM

## 2024-11-23 DIAGNOSIS — J158 Pneumonia due to other specified bacteria: Secondary | ICD-10-CM

## 2024-11-23 DIAGNOSIS — J690 Pneumonitis due to inhalation of food and vomit: Secondary | ICD-10-CM

## 2024-11-23 DIAGNOSIS — C3491 Malignant neoplasm of unspecified part of right bronchus or lung: Secondary | ICD-10-CM

## 2024-11-23 LAB — BASIC METABOLIC PANEL WITH GFR
Anion gap: 9 (ref 5–15)
BUN: 82 mg/dL — ABNORMAL HIGH (ref 8–23)
CO2: 34 mmol/L — ABNORMAL HIGH (ref 22–32)
Calcium: 9.4 mg/dL (ref 8.9–10.3)
Chloride: 100 mmol/L (ref 98–111)
Creatinine, Ser: 1.26 mg/dL — ABNORMAL HIGH (ref 0.61–1.24)
GFR, Estimated: >60 mL/min (ref >60)
Glucose, Bld: 189 mg/dL — ABNORMAL HIGH (ref 70–99)
Potassium: 3.6 mmol/L (ref 3.5–5.1)
Sodium: 143 mmol/L (ref 135–145)

## 2024-11-23 LAB — BLOOD GAS, ARTERIAL
Acid-Base Excess: 9.1 mmol/L — ABNORMAL HIGH (ref 0.0–2.0)
Bicarbonate: 36.3 mmol/L — ABNORMAL HIGH (ref 20.0–28.0)
FIO2: 50 %
O2 Saturation: 96.2 %
PEEP: 8 cmH2O
Patient temperature: 37
Pressure control: 28 cmH2O
RATE: 25 {breaths}/min
pCO2 arterial: 60 mmHg — ABNORMAL HIGH (ref 32–48)
pH, Arterial: 7.39 (ref 7.35–7.45)
pO2, Arterial: 65 mmHg — ABNORMAL LOW (ref 83–108)

## 2024-11-23 LAB — TRIGLYCERIDES: Triglycerides: 217 mg/dL — ABNORMAL HIGH (ref <150)

## 2024-11-23 LAB — CBC
HCT: 31.6 % — ABNORMAL LOW (ref 39.0–52.0)
Hemoglobin: 9.1 g/dL — ABNORMAL LOW (ref 13.0–17.0)
MCH: 28.5 pg (ref 26.0–34.0)
MCHC: 28.8 g/dL — ABNORMAL LOW (ref 30.0–36.0)
MCV: 99.1 fL (ref 80.0–100.0)
Platelets: 148 K/uL — ABNORMAL LOW (ref 150–400)
RBC: 3.19 MIL/uL — ABNORMAL LOW (ref 4.22–5.81)
RDW: 15.7 % — ABNORMAL HIGH (ref 11.5–15.5)
WBC: 22.3 K/uL — ABNORMAL HIGH (ref 4.0–10.5)
nRBC: 0 % (ref 0.0–0.2)

## 2024-11-23 LAB — GLUCOSE, CAPILLARY
Glucose-Capillary: 165 mg/dL — ABNORMAL HIGH (ref 70–99)
Glucose-Capillary: 216 mg/dL — ABNORMAL HIGH (ref 70–99)
Glucose-Capillary: 253 mg/dL — ABNORMAL HIGH (ref 70–99)
Glucose-Capillary: 258 mg/dL — ABNORMAL HIGH (ref 70–99)
Glucose-Capillary: 259 mg/dL — ABNORMAL HIGH (ref 70–99)
Glucose-Capillary: 250 mg/dL — ABNORMAL HIGH (ref 70–99)

## 2024-11-23 LAB — MAGNESIUM: Magnesium: 2.3 mg/dL (ref 1.7–2.4)

## 2024-11-23 MED ORDER — GABAPENTIN 250 MG/5ML PO SOLN
400.0000 mg | Freq: Four times a day (QID) | ORAL | Status: DC
  Administered 2024-11-23 – 2024-11-25 (×10): 400 mg
  Filled 2024-11-23 (×12): qty 8

## 2024-11-23 MED ORDER — ASPIRIN 81 MG PO CHEW
81.0000 mg | CHEWABLE_TABLET | Freq: Every day | ORAL | Status: DC
  Administered 2024-11-23 – 2024-11-25 (×3): 81 mg
  Filled 2024-11-23 (×3): qty 1

## 2024-11-23 MED ORDER — PREDNISONE 20 MG PO TABS
40.0000 mg | ORAL_TABLET | Freq: Every day | ORAL | Status: AC
  Administered 2024-11-24: 08:00:00 40 mg
  Filled 2024-11-23: qty 2

## 2024-11-23 MED ORDER — QUETIAPINE FUMARATE 25 MG PO TABS
25.0000 mg | ORAL_TABLET | Freq: Two times a day (BID) | ORAL | Status: DC
  Administered 2024-11-23 – 2024-11-25 (×6): 25 mg
  Filled 2024-11-23 (×6): qty 1

## 2024-11-23 MED ORDER — INSULIN ASPART 100 UNIT/ML IJ SOLN
0.0000 [IU] | INTRAMUSCULAR | Status: DC
  Administered 2024-11-23: 7 [IU] via SUBCUTANEOUS
  Administered 2024-11-24: 11:00:00 3 [IU] via SUBCUTANEOUS
  Administered 2024-11-24: 04:00:00 11 [IU] via SUBCUTANEOUS
  Administered 2024-11-24 – 2024-11-25 (×4): 7 [IU] via SUBCUTANEOUS
  Administered 2024-11-25 (×3): 3 [IU] via SUBCUTANEOUS
  Filled 2024-11-23: qty 3
  Filled 2024-11-23: qty 13
  Filled 2024-11-23: qty 3
  Filled 2024-11-23: qty 11
  Filled 2024-11-23: qty 10
  Filled 2024-11-23: qty 3
  Filled 2024-11-23: qty 7
  Filled 2024-11-23: qty 3
  Filled 2024-11-23 (×2): qty 7

## 2024-11-23 MED ORDER — SIMVASTATIN 20 MG PO TABS
40.0000 mg | ORAL_TABLET | Freq: Every day | ORAL | Status: DC
  Administered 2024-11-23 – 2024-11-24 (×2): 40 mg
  Filled 2024-11-23 (×3): qty 2

## 2024-11-23 MED ORDER — ROFLUMILAST 500 MCG PO TABS
500.0000 ug | ORAL_TABLET | Freq: Every day | ORAL | Status: DC
  Administered 2024-11-23 – 2024-11-25 (×3): 500 ug
  Filled 2024-11-23 (×3): qty 1

## 2024-11-23 MED ORDER — DEXMEDETOMIDINE HCL IN NACL 400 MCG/100ML IV SOLN
0.0000 ug/kg/h | INTRAVENOUS | Status: DC
  Administered 2024-11-23: 12:00:00 0.4 ug/kg/h via INTRAVENOUS
  Administered 2024-11-23 – 2024-11-24 (×3): 0.6 ug/kg/h via INTRAVENOUS
  Administered 2024-11-24: 12:00:00 0.7 ug/kg/h via INTRAVENOUS
  Administered 2024-11-25 (×2): 0.6 ug/kg/h via INTRAVENOUS
  Filled 2024-11-23 (×7): qty 100

## 2024-11-23 MED ORDER — ARFORMOTEROL TARTRATE 15 MCG/2ML IN NEBU
15.0000 ug | INHALATION_SOLUTION | Freq: Two times a day (BID) | RESPIRATORY_TRACT | Status: DC
  Administered 2024-11-23 – 2024-11-25 (×6): 15 ug via RESPIRATORY_TRACT
  Filled 2024-11-23 (×6): qty 2

## 2024-11-23 MED ORDER — FUROSEMIDE 10 MG/ML IJ SOLN
40.0000 mg | Freq: Once | INTRAMUSCULAR | Status: AC
  Administered 2024-11-23: 16:00:00 40 mg via INTRAVENOUS
  Filled 2024-11-23: qty 4

## 2024-11-23 MED ORDER — INSULIN ASPART 100 UNIT/ML IJ SOLN
0.0000 [IU] | INTRAMUSCULAR | Status: DC
  Administered 2024-11-23 (×2): 5 [IU] via SUBCUTANEOUS
  Filled 2024-11-23 (×2): qty 5

## 2024-11-23 MED ORDER — PREDNISONE 10 MG PO TABS: 10.0000 mg | ORAL_TABLET | Freq: Every day | ORAL | Status: DC

## 2024-11-23 MED ORDER — GABAPENTIN 400 MG PO CAPS: 400.0000 mg | ORAL_CAPSULE | Freq: Four times a day (QID) | ORAL | Status: DC

## 2024-11-23 MED ORDER — VITAL 1.5 CAL PO LIQD
1000.0000 mL | ORAL | Status: DC
  Administered 2024-11-23 – 2024-11-25 (×3): 1000 mL

## 2024-11-23 MED ORDER — PREDNISONE 20 MG PO TABS
30.0000 mg | ORAL_TABLET | Freq: Every day | ORAL | Status: AC
  Administered 2024-11-25: 09:00:00 30 mg
  Filled 2024-11-23: qty 1

## 2024-11-23 MED ORDER — PREDNISONE 20 MG PO TABS: 20.0000 mg | ORAL_TABLET | Freq: Every day | ORAL | Status: DC

## 2024-11-23 MED ORDER — PROSOURCE TF20 ENFIT COMPATIBL EN LIQD
60.0000 mL | Freq: Every day | ENTERAL | Status: DC
  Administered 2024-11-24 – 2024-11-25 (×2): 60 mL
  Filled 2024-11-23: qty 60

## 2024-11-23 MED ORDER — BUDESONIDE 0.5 MG/2ML IN SUSP
0.5000 mg | Freq: Two times a day (BID) | RESPIRATORY_TRACT | Status: DC
  Administered 2024-11-23 – 2024-11-25 (×5): 0.5 mg via RESPIRATORY_TRACT
  Filled 2024-11-23 (×5): qty 2

## 2024-11-23 MED ORDER — TRAZODONE HCL 50 MG PO TABS
50.0000 mg | ORAL_TABLET | Freq: Every day | ORAL | Status: DC
  Administered 2024-11-23 – 2024-11-25 (×3): 50 mg
  Filled 2024-11-23 (×3): qty 1

## 2024-11-23 MED ORDER — MIDAZOLAM HCL (PF) 2 MG/2ML IJ SOLN: 1.0000 mg | INTRAMUSCULAR | Status: DC | PRN

## 2024-11-23 MED ORDER — NUTRISOURCE FIBER PO PACK
1.0000 | PACK | Freq: Two times a day (BID) | ORAL | Status: DC
  Administered 2024-11-23 – 2024-11-24 (×3): 1
  Filled 2024-11-23 (×3): qty 1

## 2024-11-23 MED ORDER — REVEFENACIN 175 MCG/3ML IN SOLN
175.0000 ug | Freq: Every day | RESPIRATORY_TRACT | Status: DC
  Administered 2024-11-23 – 2024-11-25 (×3): 175 ug via RESPIRATORY_TRACT
  Filled 2024-11-23 (×3): qty 3

## 2024-11-23 NOTE — TOC Progression Note (Signed)
 Transition of Care [facility]) - Progression Note    Patient Details  Name: Christian Fields MRN: [id number] Date of Birth: 03-24-1956  Transition of Care [facility]) CM/SW Contact  Lauraine JAYSON Carpen, LCSW Phone Number: 11/23/2024, 11:45 AM  Clinical Narrative:  TOC continues to follow progress.   Expected Discharge Plan and Services                                               Social Drivers of Health (SDOH) Interventions SDOH Screenings   Food Insecurity: No Food Insecurity (11/15/2024)  Housing: Low Risk (11/15/2024)  Transportation Needs: No Transportation Needs (11/15/2024)  Utilities: Not At Risk (11/15/2024)  Financial Resource Strain: Low Risk  (02/05/2024)   Received from [facility]  Social Connections: Socially Isolated (11/15/2024)  Tobacco Use: Medium Risk (11/12/2024)    Readmission Risk Interventions    11/18/2024    3:31 PM 02/09/2024   11:40 AM 10/09/2023   11:34 AM  Readmission Risk Prevention Plan  Transportation Screening Complete Complete   Medication Review Oceanographer) Complete Complete   PCP or Specialist appointment within 3-5 days of discharge Complete Complete   HRI or Home Care Consult Complete Complete   SW Recovery Care/Counseling Consult Complete Complete Complete  Palliative Care Screening Not Applicable Not Applicable   Skilled Nursing Facility Not Applicable Complete

## 2024-11-23 NOTE — Progress Notes (Signed)
 PHARMACY CONSULT NOTE  Pharmacy Consult for Electrolyte Monitoring and Replacement   Recent Labs: Potassium (mmol/L)  Date Value  11/23/2024 3.6  05/17/2014 3.5   Magnesium  (mg/dL)  Date Value  87/83/7974 2.3   Calcium  (mg/dL)  Date Value  87/83/7974 9.4   Calcium , Total (mg/dL)  Date Value  95/76/7984 9.3   Albumin  (g/dL)  Date Value  87/93/7974 3.7  09/24/2013 3.6   Phosphorus (mg/dL)  Date Value  87/87/7974 3.0   Sodium (mmol/L)  Date Value  11/23/2024 143  03/31/2014 136    Assessment:  68 y.o. male admitted on 11/12/2024 with sepsis and acute hypoxic on chronic hypercarbic respiratory failure. PMH significant for COPD on home oxygen  4 L, non-small cell lung cancer s/p RXT, OSA on CPAP, GERD, history of TIA, asthma, diabetes type 2, HLD, HTN, and CKD-3a. Patient was also recently admitted from 11/26-11/30 for COPD exacerbation. Pharmacy has been consulted for replacing electrolytes  Nutrition: Vital AF at 30 mL/hr + FWF 200 mL every 4h  Goal of Therapy:  Electrolytes WNL  Plan:  --continue free water  at 200 mL every 4 hours --Recheck electrolytes as ordered by PCCM team  Christian Fields 11/23/2024 7:06 AM

## 2024-11-23 NOTE — Consult Note (Signed)
 Consultation Note Date: 11/23/2024   Patient Name: Christian Fields  DOB: 01-13-1956  MRN: [id number]  Age / Sex: 68 y.o., male  PCP: Lora Odor, FNP Referring Physician: Isaiah Scrivener, MD  Reason for Consultation: Establishing goals of care  HPI/Patient Profile: Christian Fields is a pleasant 68 y.o. male with medical history significant for COPD on home oxygen  4 L, non-small cell lung cancer s/p RXT, OSA on CPAP, GERD, history of TIA, asthma, diabetes type 2, HLD, HTN, recent history of admission and discharge from [facility] between 11/03/2024 to 11/07/2024 after being treated for COPD exacerbation brought in today for shortness of breath started last night.  He stated that he finished his antibiotics cefdinir and doxycycline   couple days ago.  EMS gave him 3 DuoNebs, 2 g of magnesium  and 125 mg of Solu-Medrol  IV.  Initial oxygen  was 60 to 70% on 4 L of nasal cannula oxygen .  Patient was placed on nonrebreather.  Patient stated that he was feeling better and his breathing was somewhat improved.  He denied any fever, chills, nausea, vomiting.  He was tachypneic in the mid to upper 30s.  Patient sounds were diminished bilaterally with extensive wheezes.   Clinical Assessment and Goals of Care: Patient is known to PMT from previous admissions, most recently 09/2023.  Notes and labs reviewed.  Patient is widowed with 3 children. Previously, patient stated that he would not want long-term ventilator support and would not want tracheostomy.  He stated he did not believe that he would want CPR but wanted to speak with his daughter prior to making any decisions.  He was not amenable to a dysphagia diet or a feeding tube.  At that time he was considering advanced directives and H POA documents, but wanted to consider this further prior to completing.  He desired to speak with his family by himself privately regarding his  wishes.  In to see patient.  He is currently resting in bed at this time on ventilator support. Visitors are currently at bedside.  Called to speak with patient's daughter Roxie.  She discusses that patient still lives with his son, her brother.  She states for the past 6 years and currently, she takes him to doctors appointments and assist with his medications and other needs.  She states her brother is unable to do this.  She discusses that the past few months, he has had increasing shortness of breath, has slept more, and in bed unable to do a lot of things for himself which frustrates him.  She states his oxygen  needs have increased.  She is able to accurately articulate his health issues and updates that have been provided by [facility].  She states her father has discussed his wishes with her, and she is aware that he would not want CPR, would be amenable to temporary ventilator support, but would not want to live on ventilator support, or have a tracheostomy.    SUMMARY OF RECOMMENDATIONS   Time for outcomes PMT will follow  Primary Diagnoses: Present on Admission:  Acute hypoxic on chronic hypercapnic respiratory failure (HCC)  NSCLC of left lung (HCC)  COPD with acute exacerbation (HCC)  Sleep apnea  Hypertension  Chronic heart failure with preserved ejection fraction (HFpEF) (HCC)  COPD exacerbation (HCC)  AKI (acute kidney injury)  Aspiration pneumonia (HCC)   I have reviewed the medical record, interviewed the patient and family, and examined the patient. The following aspects are pertinent.  Past Medical History:  Diagnosis Date   Anemia    Anxiety    Arthritis    Asthma    Cancer (HCC)    Basal Cell Skin Cancer   Chronic back pain    COPD (chronic obstructive pulmonary disease) (HCC)    Depression    Diabetes mellitus (HCC)    Dyspnea    GERD (gastroesophageal reflux disease)    Gout    Gout    Headache    History of blood clots    Left Leg--July 2018    History of kidney stones    Hyperlipidemia    Hyperlipidemia    Hypertension    Kidney stones    Neuropathy    On home oxygen  therapy    2 L / M   Pneumonia 06/2017   Sleep apnea    Ulcer of foot (HCC)    Right   Social History   Socioeconomic History   Marital status: Widowed    Spouse name: Not on file   Number of children: Not on file   Years of education: Not on file   Highest education level: Not on file  Occupational History   Not on file  Tobacco Use   Smoking status: Former    Current packs/day: 0.50    Average packs/day: 0.5 packs/day for 8.0 years (4.0 ttl pk-yrs)    Types: Cigarettes    Start date: 2018   Smokeless tobacco: Never  Vaping Use   Vaping status: Never Used  Substance and Sexual Activity   Alcohol  use: Yes    Alcohol /week: 1.0 standard drink of alcohol     Types: 1 Cans of beer per week    Comment: occ   Drug use: No   Sexual activity: Not Currently  Other Topics Concern   Not on file  Social History Narrative   Not on file   Social Drivers of Health   Tobacco Use: Medium Risk (11/12/2024)   Patient History    Smoking Tobacco Use: Former    Smokeless Tobacco Use: Never    Passive Exposure: Not on file  Financial Resource Strain: Low Risk  (02/05/2024)   Received from [facility]   Overall Financial Resource Strain (CARDIA)    Difficulty of Paying Living Expenses: Not hard at all  Food Insecurity: No Food Insecurity (11/15/2024)   Epic    Worried About Radiation Protection Practitioner of Food in the Last Year: Never true    Ran Out of Food in the Last Year: Never true  Transportation Needs: No Transportation Needs (11/15/2024)   Epic    Lack of Transportation (Medical): No    Lack of Transportation (Non-Medical): No  Physical Activity: Not on file  Stress: Not on file  Social Connections: Socially Isolated (11/15/2024)   Social Connection and Isolation Panel    Frequency of Communication with Friends and Family: More than three times a  week    Frequency of Social Gatherings with Friends and Family: Never    Attends Religious Services: Never  Active Member of Clubs or Organizations: No    Attends Banker Meetings: Never    Marital Status: Widowed  Depression (PHQ2-9): Not on file  Alcohol  Screen: Not on file  Housing: Low Risk (11/15/2024)   Epic    Unable to Pay for Housing in the Last Year: No    Number of Times Moved in the Last Year: 0    Homeless in the Last Year: No  Utilities: Not At Risk (11/15/2024)   Epic    Threatened with loss of utilities: No  Health Literacy: Not on file   Family History  Problem Relation Age of Onset   Hypertension Mother    Scheduled Meds:  arformoterol   15 mcg Nebulization Q12H   aspirin   81 mg Per Tube Daily   budesonide  (PULMICORT ) nebulizer solution  0.5 mg Nebulization BID   Chlorhexidine  Gluconate Cloth  6 each Topical Q0600   clopidogrel   75 mg Per Tube Daily   enoxaparin  (LOVENOX ) injection  40 mg Subcutaneous Q24H   ezetimibe   10 mg Per Tube Daily   [START ON 11/24/2024] feeding supplement (PROSource TF20)  60 mL Per Tube Daily   fiber  1 packet Per Tube BID   free water   200 mL Per Tube Q4H   gabapentin   400 mg Per Tube QID   insulin  aspart  0-9 Units Subcutaneous Q4H   methylPREDNISolone  (SOLU-MEDROL ) injection  40 mg Intravenous Q24H   nutrition supplement (JUVEN)  1 packet Per Tube BID BM   mouth rinse  15 mL Mouth Rinse Q2H   pantoprazole  (PROTONIX ) IV  40 mg Intravenous QHS   QUEtiapine   25 mg Per Tube BID   revefenacin   175 mcg Nebulization Daily   roflumilast   500 mcg Per Tube Daily   simvastatin   40 mg Per Tube QHS   traZODone   50 mg Per Tube QHS   Continuous Infusions:  dexmedetomidine  (PRECEDEX ) IV infusion 0.5 mcg/kg/hr (11/23/24 1341)   feeding supplement (VITAL 1.5 CAL)     HYDROmorphone  3 mg/hr (11/23/24 1341)   norepinephrine  (LEVOPHED ) Adult infusion 3 mcg/min (11/23/24 1341)   propofol  (DIPRIVAN ) infusion 60 mcg/kg/min  (11/23/24 1341)   PRN Meds:.acetaminophen  **OR** acetaminophen , artificial tears, HYDROmorphone , ipratropium-albuterol , liver oil-zinc  oxide, ondansetron  **OR** ondansetron  (ZOFRAN ) IV, mouth rinse, polyethylene glycol Medications Prior to Admission:  Prior to Admission medications  Medication Sig Start Date End Date Taking? Authorizing Provider  acetaminophen  (TYLENOL ) 325 MG tablet Take 650 mg by mouth every 6 (six) hours as needed.   Yes [provider]  albuterol  (VENTOLIN  HFA) 108 (90 Base) MCG/ACT inhaler Inhale 2 puffs into the lungs every 4 (four) hours as needed for wheezing or shortness of breath.   Yes [provider]  allopurinol  (ZYLOPRIM ) 300 MG tablet Take 600 mg by mouth daily. 02/24/20  Yes [provider]  aspirin  EC 81 MG tablet Take 1 tablet by mouth daily.   Yes [provider]  azelastine  (ASTELIN ) 0.1 % nasal spray Place 2 sprays into both nostrils 2 (two) times daily as needed. 05/27/23  Yes [provider]  Buprenorphine  HCl-Naloxone  HCl 8-2 MG FILM Place 1 Film under the tongue in the morning, at noon, and at bedtime.   Yes [provider]  clopidogrel  (PLAVIX ) 75 MG tablet Take 1 tablet (75 mg total) by mouth daily. 02/02/21  Yes Leotis Bogus, MD  dapagliflozin  propanediol (FARXIGA ) 10 MG TABS tablet Take 1 tablet by mouth daily. 03/16/24 03/16/25 Yes [provider]  DULoxetine  (CYMBALTA )  60 MG capsule Take 90 mg by mouth daily.   Yes [provider]  ezetimibe  (ZETIA ) 10 MG tablet Take 10 mg by mouth daily. 02/24/20  Yes [provider]  fluticasone  (FLONASE ) 50 MCG/ACT nasal spray Place 1 spray into both nostrils daily as needed for allergies. 10/16/18  Yes [provider]  Fluticasone -Umeclidin-Vilant (TRELEGY ELLIPTA) 100-62.5-25 MCG/ACT AEPB Inhale 1 puff into the lungs in the morning. 09/12/21  Yes [provider]  furosemide  (LASIX ) 20 MG tablet Take 1 tablet (20 mg total)  by mouth daily. 02/23/24  Yes Jens Durand, MD  gabapentin  (NEURONTIN ) 800 MG tablet Take 800 mg by mouth 4 (four) times daily. 10/22/23  Yes [provider]  ipratropium-albuterol  (DUONEB) 0.5-2.5 (3) MG/3ML SOLN Take 3 mLs by nebulization every 6 (six) hours as needed (wheezing).   Yes [provider]  metFORMIN  (GLUCOPHAGE ) 1000 MG tablet Take 1,000 mg by mouth daily.   Yes [provider]  metoprolol  succinate (TOPROL -XL) 50 MG 24 hr tablet Take 50 mg by mouth daily. 02/24/20  Yes [provider]  Multiple Vitamins-Minerals (MULTIVITAMIN ADULT, MINERALS, PO) Take 1 tablet by mouth every morning.   Yes [provider]  naloxone  (NARCAN ) nasal spray 4 mg/0.1 mL Place 1 spray into the nose once.   Yes [provider]  OHTUVAYRE  3 MG/2.5ML SUSP Take 5 mLs by mouth daily. 07/05/24  Yes [provider]  ondansetron  (ZOFRAN -ODT) 4 MG disintegrating tablet Take 4 mg by mouth every 8 (eight) hours as needed for nausea.   Yes [provider]  OXYGEN  Inhale 4 L into the lungs.   Yes [provider]  predniSONE  (DELTASONE ) 20 MG tablet 60mg  daily x 2 days, 40mg  daily x 2 days, 20mg  daily x 2 days then stop. 11/07/24  Yes Trudy Anthony HERO, MD  roflumilast  (DALIRESP ) 500 MCG TABS tablet Take 500 mcg by mouth daily. 08/15/21  Yes [provider]  simvastatin  (ZOCOR ) 40 MG tablet Take 40 mg by mouth at bedtime. 01/17/16  Yes [provider]  Syringe/Needle, Disp, (SYRINGE 3CC/25GX1) 25G X 1 3 ML MISC 1 Syringe by Does not apply route every 30 (thirty) days. 07/12/22  Yes Brahmanday, Govinda R, MD  traZODone  (DESYREL ) 50 MG tablet Take 50 mg by mouth at bedtime as needed for sleep. 01/22/24 01/21/25 Yes [provider]   Allergies[1] Review of Systems  Unable to perform ROS   Physical Exam Constitutional:      Comments: Eyes closed  Pulmonary:     Comments: On ventilator support    Vital Signs: BP  (!) 76/53 (BP Location: Left Arm)   Pulse 100   Temp 98.3 F (36.8 C) (Axillary)   Resp (!) 26   Ht 5' 6 (1.676 m)   Wt 75.2 kg   SpO2 94%   BMI 26.76 kg/m  Pain Scale: CPOT   Pain Score: Asleep   SpO2: SpO2: 94 % O2 Device:SpO2: 94 % O2 Flow Rate: .O2 Flow Rate (L/min): 6 L/min  IO: Intake/output summary:  Intake/Output Summary (Last 24 hours) at 11/23/2024 1357 Last data filed at 11/23/2024 1341 Gross per 24 hour  Intake 3819.02 ml  Output 745 ml  Net 3074.02 ml    LBM: Last BM Date : 11/23/24 Baseline Weight: Weight: 77 kg Most recent weight: Weight: 75.2 kg       Signed by: Camelia Lewis, NP   Please contact Palliative Medicine Team phone at [phone] for questions and concerns.  For individual  provider: See Amion                 [1]  Allergies Allergen Reactions   Bee Venom Anaphylaxis   Linezolid  Other (See Comments)    Thrombocytopenia, hyperlactatemia   Azithromycin  Itching and Rash

## 2024-11-23 NOTE — Progress Notes (Signed)
 NAME:  KARA MIERZEJEWSKI, MRN:  [id number], DOB:  [birth year], LOS: 11 ADMISSION DATE:  11/12/2024, CONSULTATION DATE:  11/15/2024 REFERRING MD:  Dr. Awanda, CHIEF COMPLAINT:  Acute Respiratory Distress   Brief Pt Description / Synopsis:  68 y.o. male with PMHx significant for COPD and lung cancer admitted with Acute on Chronic Hypoxic and Hypercapnic Respiratory Failure due to Acute COPD Exacerbation due to Parainfluenza Infection requiring BiPAP.  Failed trial of BiPAP on 11/16/24 requiring intubation and mechanical ventilation.   History of Present Illness:  Christian Fields is a 68 year old male with a history of lifelong smoking and advanced COPD, had a diagnosis of stage I left upper lobe lung cancer after noticing an interval increase in the size of nodule from 1 cm to 1.4 in 2021. He went straight to SBRT due to being a poor surgical candidate for biopsy. He also is having recurrent COPD exacerbations and having rehospitalization for this. He has chronic hypoxemia and uses oxygen  at home up to 5 L/min. He has chronic physical deconditioning. He has chronic peripheral edema and also follows up with cardiology for essential hypertension and dyslipidemia. He also has a background history of CKD 3 A, sleep apnea noncompliant with CPAP therapy, polysubstance abuse and abdominal aortic aneurysm. He came to the hospital on this admission with complaints of worsening shortness of breath x 1 day was noted to have oxygen  saturation approximately 60% on his usual 4 L/min nasal cannula he denies sick contacts or fevers at home. He had a venous blood gas performed in the ED which showed mild acidemia with hypercapnia. He was treated with nebulizer therapy and steroids as well as BiPAP and reports slight improvement since then. He is started on empiric antibiotics with cefepime  and vancomycin  for possible pneumonia. PCCM consultation placed due to recurrent hospitalization with advanced COPD, chronic hypoxemia and lung  cancer.    Please see Significant Hospital Events section below for full detailed hospital course.   Pertinent  Medical History   Past Medical History:  Diagnosis Date   Anemia    Anxiety    Arthritis    Asthma    Cancer (HCC)    Basal Cell Skin Cancer   Chronic back pain    COPD (chronic obstructive pulmonary disease) (HCC)    Depression    Diabetes mellitus (HCC)    Dyspnea    GERD (gastroesophageal reflux disease)    Gout    Gout    Headache    History of blood clots    Left Leg--July 2018   History of kidney stones    Hyperlipidemia    Hyperlipidemia    Hypertension    Kidney stones    Neuropathy    On home oxygen  therapy    2 L / M   Pneumonia 06/2017   Sleep apnea    Ulcer of foot (HCC)    Right    Micro Data:  12/5: COVID/FLU/RSV PCR>> negative 12/5: RVP >> + Parainfluenza  12/5: MRSA PCR>> negative  12/9: Tracheal aspirate>> + enterococcus faecalis 12/9: Strep pneumo urinary antigen>>negative 12/9: Legionella urinary antigen>>  Antimicrobials:   Anti-infectives (From admission, onward)    Start     Dose/Rate Route Frequency Ordered Stop   11/19/24 0800  vancomycin  (VANCOCIN ) IVPB 1000 mg/200 mL premix  Status:  Discontinued        1,000 mg 200 mL/hr over 60 Minutes Intravenous Every 24 hours 11/18/24 1253 11/19/24 1036   11/18/24 1300  vancomycin  (VANCOREADY) IVPB  1250 mg/250 mL        1,250 mg 166.7 mL/hr over 90 Minutes Intravenous  Once 11/18/24 1129 11/18/24 1333   11/16/24 1200  piperacillin -tazobactam (ZOSYN ) IVPB 3.375 g        3.375 g 12.5 mL/hr over 240 Minutes Intravenous Every 8 hours 11/16/24 1110     11/15/24 1215  azithromycin  (ZITHROMAX ) 500 mg in sodium chloride  0.9 % 250 mL IVPB  Status:  Discontinued        500 mg 250 mL/hr over 60 Minutes Intravenous Every 24 hours 11/15/24 1129 11/18/24 1016   11/15/24 1215  cefTRIAXone  (ROCEPHIN ) 2 g in sodium chloride  0.9 % 100 mL IVPB  Status:  Discontinued        2 g 200 mL/hr over  30 Minutes Intravenous Every 24 hours 11/15/24 1129 11/16/24 1030   11/13/24 1900  vancomycin  (VANCOREADY) IVPB 1250 mg/250 mL  Status:  Discontinued        1,250 mg 166.7 mL/hr over 90 Minutes Intravenous Every 24 hours 11/12/24 1744 11/12/24 1749   11/13/24 1000  azithromycin  (ZITHROMAX ) tablet 250 mg  Status:  Discontinued        250 mg Oral Daily 11/13/24 0819 11/15/24 1129   11/13/24 0500  piperacillin -tazobactam (ZOSYN ) IVPB 3.375 g  Status:  Discontinued        3.375 g 12.5 mL/hr over 240 Minutes Intravenous Every 8 hours 11/12/24 2112 11/13/24 0814   11/12/24 1900  vancomycin  (VANCOREADY) IVPB 1750 mg/350 mL  Status:  Discontinued        1,750 mg 175 mL/hr over 120 Minutes Intravenous  Once 11/12/24 1744 11/12/24 1749   11/12/24 1700  ceFEPIme  (MAXIPIME ) 2 g in sodium chloride  0.9 % 100 mL IVPB  Status:  Discontinued        2 g 200 mL/hr over 30 Minutes Intravenous Every 12 hours 11/12/24 1615 11/12/24 1745       Significant Hospital Events: Including procedures, antibiotic start and stop dates in addition to other pertinent events   12/5: Admitted by [facility].  Pulmonary consulted for severe AECOPD requiring BiPAP. 11/14/24-patient with Parainfluenza induced AECOPD. He was moved to Step down unit due to progressive dyspnea and is on BIPAP. He is on solumedrol BID and nebulizer therapy. Will continue to follow. Anticipate slow improvement over next 3-5 weeks with this virus. Hopefully dc in 3-4days.  11/15/24: Mid morning pt with increased WOB and accessory muscle use.  PCCM consulted.  Given 40 mg Lasix  by [name].  Increase duonebs to q4h and start Ceftriaxone  and increase Azithromycin  dose to 500 mg.  High risk for intubation.  11/16/24: Acutely decompensated, became severely hypoxic (sats 60's) with increased WOB, required intubation and mechanical ventilation. Given acute decompensation and persistent sinus tachycardia, CTa obtained which is negative for PE, does show possible superimposed  bacterial pneumonia.  Ceftriaxone  broadened to Zosyn . Telemetry alarming for ST elevation, obtained EKG and reviewed with Dr. Mady of Cardiology, felt not to be STEMI, obtaining Troponin and Echo. 11/17/24: No acute events overnight. Cr worsening at 1.41 this am. Remains on Zosyn . Awake and alert this am, transitioned to SBT but failed quickly. Became tachycardic and hypertensive. Restarted sedation.  11/18/24: No acute overnight events. Remains critically ill requiring mechanical ventilation. Will perform another SBT today with precedex .  11/19/24 11/20/24: 11/21/24: 11/22/24: Remains critically ill requiring mechanical ventilation. On dilaudid  and propofol  gtt, 3mcg of levo. Worsening hypercapnia overnight requiring vecuronium  administration and versed .. Repeat ABG pending.  Interim History / Subjective:  Patient intubated on PCV Sedate on 80 prop and 3 dilaudid   Objective   Blood pressure 104/67, pulse (!) 101, temperature 97.8 F (36.6 C), temperature source Oral, resp. rate 20, height 5' 6 (1.676 m), weight 75.2 kg, SpO2 93%.    Vent Mode: PCV FiO2 (%):  [40 %-60 %] 40 % Set Rate:  [25 bmp] 25 bmp PEEP:  [8 cmH20] 8 cmH20 Plateau Pressure:  [23 cmH20] 23 cmH20   Intake/Output Summary (Last 24 hours) at 11/23/2024 0816 Last data filed at 11/23/2024 0700 Gross per 24 hour  Intake 3702.95 ml  Output 945 ml  Net 2757.95 ml   Filed Weights   11/21/24 0330 11/22/24 0500 11/23/24 0500  Weight: 71.2 kg 71.2 kg 75.2 kg    Examination: General: critically ill appearing on mech vent HEENT: MM pink/moist; ETT in place Neuro: sedate; when trying to wean sedation became agitated and mae spontaneously, not following commands CV: s1s2, RRR, no m/r/g PULM:  dim clear BS bilaterally; on mech vent pcv GI: soft, bsx4 active  Extremities: warm/dry, no edema  Skin: no rashes or lesions    Resolved Hospital Problem list      Assessment & Plan:   Sedation needs in setting of  mechanical ventilation Hx of Chronic pain syndrome Hx: Depression. Anxiety, sleep apnea Plan: -attempted to wean sedation for SAT but patient became agitated requiring sedation to be turned back up -will add precedex  to help wean off propofol ; RASS goal 0 to -1 -add home gabapentin  at half dose and trazodone ; add seroquel ; consider adding oxy per tube -delirium precautions  Acute on chronic Hypoxic & Hypercapnic Respiratory Failure Acute COPD Exacerbation d/t Parainfluenza Viral Infection and Aspiration PNA Superimposed Bacterial Pneumonia (+ Enterococcus Faecalis) Lung Cancer Hx: COPD on home oxygen , Asthma  CTa Chest PE 12/9: negative for PE, new multifocal airspace consolidation in RLL, RML, and LLL, stable 2.6 cm pleural based mass like consolidation in lateral LUL, enlarged central pulmonary arteries suggesting pulmonary artery hypertension  Plan: -attempted to wean sedation to allow for SBT; patient became agitated and desated requiring sedation to be turned back up -PCV currently; LTVV strategy with tidal volumes of 6-8 cc/kg ideal body weight -Goal plateau pressures less than 30 and driving pressures less than 15 -Wean PEEP/FiO2 for SpO2 >90% -VAP bundle in place -Daily SAT and SBT -PAD protocol in place -wean sedation for RASS goal 0 to -1 -steroid taper -triple therapy maintenance nebs; resume home daliresp ; prn duoneb -consider diuresis  Tachycardia, likely compensatory in nature d/t respiratory failure Mildly Elevated Troponin, suspect demand ischemia  Hx: HTN, HLD 12/9: Telemetry alarming ST elevation, EKG with ST elevation in inferior leads (but no reciprocal changes), reviewed with Dr. Mady, felt not to be a STEMI 11/17/24 ECHO: LVEF 60-65%, mild LVH, severely elevated pulmonary artery pressure with mildly reduced RV systolic function; moderate thickening of aortic valve.  Plan: -tele monitoring -cont home plavix , statin and ezetimibe   Mild AKI on CKD  3a Plan: -Trend BMP / urinary output -Replace electrolytes as indicated -Avoid nephrotoxic agents, ensure adequate renal perfusion  GERD Plan: -ppi  Type II Diabetes Mellitus Plan: -ssi and cbg monitoring -steroid taper  Parainfluenza Viral Infection Superimposed bacterial pneumonia  Enterococcus Faecalis Plan: -completed abx -trend wbc/fever curve off abx    Best Practice (right click and Reselect all SmartList Selections daily)   Diet/type: tube feeds DVT prophylaxis: LMWH GI prophylaxis: PPI Lines: right radial arterial line Foley:  yes, and still needed  Code Status:  DNR Last date of multidisciplinary goals of care discussion [12/15]  12/15: GOC; updated daughter Roxie over phone. Patient is day 7 on ventilator. Daughter states she does not think he would want tracheostomy but will discuss with her family some more and let us  know. She states she talked with family and they discussed he would not want compressions. DNR w/ intervention order placed.   Labs   CBC: Recent Labs  Lab 11/19/24 0336 11/20/24 0331 11/21/24 0315 11/22/24 0332 11/23/24 0352  WBC 15.1* 23.7* 27.3* 22.0* 22.3*  HGB 10.4* 10.9* 11.2* 10.3* 9.1*  HCT 34.1* 37.2* 39.8 36.1* 31.6*  MCV 93.2 97.4 100.5* 100.6* 99.1  PLT 163 201 187 147* 148*    Basic Metabolic Panel: Recent Labs  Lab 11/17/24 0315 11/18/24 0332 11/19/24 0336 11/20/24 0331 11/21/24 0315 11/22/24 0332 11/23/24 0352  NA 139 139 140 141 144 146* 143  K 4.4 4.3 4.5 4.1 4.2 4.1 3.6  CL 95* 97* 100 100 100 103 100  CO2 34* 34* 32 31 33* 34* 34*  GLUCOSE 193* 174* 201* 126* 167* 195* 189*  BUN 47* 57* 59* 50* 60* 69* 82*  CREATININE 1.41* 1.39* 1.28* 1.23 1.27* 1.42* 1.26*  CALCIUM  8.7* 8.7* 8.8* 9.1 9.1 9.5 9.4  MG 2.0 2.1 2.2 2.2 2.3 2.4 2.3  PHOS 2.9 3.0 3.0  --   --   --   --    GFR: Estimated Creatinine Clearance: 49.9 mL/min (A) (by C-G formula based on SCr of 1.26 mg/dL (H)). Recent Labs  Lab  11/20/24 0331 11/21/24 0315 11/22/24 0332 11/23/24 0352  WBC 23.7* 27.3* 22.0* 22.3*    Liver Function Tests: No results for input(s): AST, ALT, ALKPHOS, BILITOT, PROT, ALBUMIN  in the last 168 hours.  No results for input(s): LIPASE, AMYLASE in the last 168 hours. No results for input(s): AMMONIA in the last 168 hours.  ABG    Component Value Date/Time   PHART 7.39 11/23/2024 0501   PCO2ART 60 (H) 11/23/2024 0501   PO2ART 65 (L) 11/23/2024 0501   HCO3 36.3 (H) 11/23/2024 0501   ACIDBASEDEF 1.4 11/03/2024 1854   O2SAT 96.2 11/23/2024 0501     Coagulation Profile: No results for input(s): INR, PROTIME in the last 168 hours.   Cardiac Enzymes: No results for input(s): CKTOTAL, CKMB, CKMBINDEX, TROPONINI in the last 168 hours.  HbA1C: Hemoglobin A1C  Date/Time Value Ref Range Status  09/25/2013 03:56 AM 6.3 4.2 - 6.3 % Final    Comment:    The American Diabetes Association recommends that a primary goal of therapy should be <7% and that physicians should reevaluate the treatment regimen in patients with HbA1c values consistently >8%.    Hgb A1c MFr Bld  Date/Time Value Ref Range Status  07/16/2024 03:19 AM 6.0 (H) 4.8 - 5.6 % Final    Comment:    (NOTE)         Prediabetes: 5.7 - 6.4         Diabetes: >6.4         Glycemic control for adults with diabetes: <7.0   10/17/2023 04:51 AM 5.7 (H) 4.8 - 5.6 % Final    Comment:    (NOTE) Pre diabetes:          5.7%-6.4%  Diabetes:              >6.4%  Glycemic control for   <7.0% adults with diabetes     CBG: Recent Labs  Lab 11/22/24 1547 11/22/24 1917  11/22/24 2306 11/23/24 0306 11/23/24 0730  GLUCAP 203* 145* 134* 165* 216*    Review of Systems:   Unable to assess due to intubation/sedation/critical illness   Past Medical History:  He,  has a past medical history of Anemia, Anxiety, Arthritis, Asthma, Cancer (HCC), Chronic back pain, COPD (chronic obstructive pulmonary  disease) (HCC), Depression, Diabetes mellitus (HCC), Dyspnea, GERD (gastroesophageal reflux disease), Gout, Gout, Headache, History of blood clots, History of kidney stones, Hyperlipidemia, Hyperlipidemia, Hypertension, Kidney stones, Neuropathy, On home oxygen  therapy, Pneumonia (06/2017), Sleep apnea, and Ulcer of foot (HCC).   Surgical History:   Past Surgical History:  Procedure Laterality Date   APPENDECTOMY     DG FEET 2 VIEWS BILAT     IRRIGATION AND DEBRIDEMENT FOOT Left 07/11/2023   Procedure: IRRIGATION AND DEBRIDEMENT FOOT;  Surgeon: Ashley Soulier, DPM;  Location: [facility] ORS;  Service: Orthopedics/Podiatry;  Laterality: Left;   LIPOMA EXCISION Right 08/15/2017   Procedure: EXCISION TUMOR(CYST) FOOT;  Surgeon: Lilli Cough, DPM;  Location: [facility] ORS;  Service: Podiatry;  Laterality: Right;   LOWER EXTREMITY ANGIOGRAPHY Left 07/03/2023   Procedure: Lower Extremity Angiography;  Surgeon: Marea Selinda RAMAN, MD;  Location: [facility];  Service: Cardiovascular;  Laterality: Left;   OTHER SURGICAL HISTORY Bilateral Foot surgery     Social History:   reports that he has quit smoking. His smoking use included cigarettes. He started smoking about 7 years ago. He has a 4 pack-year smoking history. He has never used smokeless tobacco. He reports current alcohol  use of about 1.0 standard drink of alcohol  per week. He reports that he does not use drugs.   Family History:  His family history includes Hypertension in his mother.   Allergies Allergies  Allergen Reactions   Bee Venom Anaphylaxis   Linezolid  Other (See Comments)    Thrombocytopenia, hyperlactatemia   Azithromycin  Itching and Rash     Home Medications  Prior to Admission medications   Medication Sig Start Date End Date Taking? Authorizing Provider  acetaminophen  (TYLENOL ) 325 MG tablet Take 650 mg by mouth every 6 (six) hours as needed.   Yes [provider]  albuterol  (VENTOLIN  HFA) 108 (90 Base) MCG/ACT  inhaler Inhale 2 puffs into the lungs every 4 (four) hours as needed for wheezing or shortness of breath.   Yes [provider]  allopurinol  (ZYLOPRIM ) 300 MG tablet Take 600 mg by mouth daily. 02/24/20  Yes [provider]  aspirin  EC 81 MG tablet Take 1 tablet by mouth daily.   Yes [provider]  azelastine  (ASTELIN ) 0.1 % nasal spray Place 2 sprays into both nostrils 2 (two) times daily as needed. 05/27/23  Yes [provider]  Buprenorphine  HCl-Naloxone  HCl 8-2 MG FILM Place 1 Film under the tongue in the morning, at noon, and at bedtime.   Yes [provider]  clopidogrel  (PLAVIX ) 75 MG tablet Take 1 tablet (75 mg total) by mouth daily. 02/02/21  Yes Leotis Bogus, MD  dapagliflozin  propanediol (FARXIGA ) 10 MG TABS tablet Take 1 tablet by mouth daily. 03/16/24 03/16/25 Yes [provider]  DULoxetine  (CYMBALTA ) 60 MG capsule Take 60 mg by mouth daily.   Yes [provider]  ezetimibe  (ZETIA ) 10 MG tablet Take 10 mg by mouth daily. 02/24/20  Yes [provider]  fluticasone  (FLONASE ) 50 MCG/ACT nasal spray Place 1 spray into both nostrils daily as needed for allergies. 10/16/18  Yes [provider]  Fluticasone -Umeclidin-Vilant (TRELEGY ELLIPTA) 100-62.5-25 MCG/ACT AEPB Inhale 1  puff into the lungs in the morning. 09/12/21  Yes [provider]  furosemide  (LASIX ) 20 MG tablet Take 1 tablet (20 mg total) by mouth daily. 02/23/24  Yes Jens Durand, MD  gabapentin  (NEURONTIN ) 800 MG tablet Take 800 mg by mouth 4 (four) times daily. 10/22/23  Yes [provider]  ipratropium-albuterol  (DUONEB) 0.5-2.5 (3) MG/3ML SOLN Take 3 mLs by nebulization every 6 (six) hours as needed (wheezing).   Yes [provider]  metFORMIN  (GLUCOPHAGE ) 1000 MG tablet Take 1,000 mg by mouth daily.   Yes [provider]  metoprolol  succinate (TOPROL -XL) 50 MG 24 hr tablet Take 50 mg by mouth daily. 02/24/20  Yes  [provider]  Multiple Vitamins-Minerals (MULTIVITAMIN ADULT, MINERALS, PO) Take 1 tablet by mouth every morning.   Yes [provider]  naloxone  (NARCAN ) nasal spray 4 mg/0.1 mL Place 1 spray into the nose once.   Yes [provider]  OHTUVAYRE  3 MG/2.5ML SUSP Take 5 mLs by mouth daily. 07/05/24  Yes [provider]  ondansetron  (ZOFRAN -ODT) 4 MG disintegrating tablet Take 4 mg by mouth every 8 (eight) hours as needed for nausea.   Yes [provider]  OXYGEN  Inhale 4 L into the lungs.   Yes [provider]  predniSONE  (DELTASONE ) 20 MG tablet 60mg  daily x 2 days, 40mg  daily x 2 days, 20mg  daily x 2 days then stop. 11/07/24  Yes Trudy Anthony HERO, MD  roflumilast  (DALIRESP ) 500 MCG TABS tablet Take 500 mcg by mouth daily. 08/15/21  Yes [provider]  simvastatin  (ZOCOR ) 40 MG tablet Take 40 mg by mouth at bedtime. 01/17/16  Yes [provider]  Syringe/Needle, Disp, (SYRINGE 3CC/25GX1) 25G X 1 3 ML MISC 1 Syringe by Does not apply route every 30 (thirty) days. 07/12/22  Yes Brahmanday, Govinda R, MD  traZODone  (DESYREL ) 50 MG tablet Take 50 mg by mouth at bedtime as needed for sleep. 01/22/24 01/21/25 Yes [provider]  bisacodyl  (DULCOLAX) 5 MG EC tablet Take 1 tablet (5 mg total) by mouth daily as needed for moderate constipation. Patient not taking: Reported on 11/03/2024 09/04/23   Dorinda Drue DASEN, MD     Critical care time: 40 minutes    JD Emilio RIGGERS [facility] 11/23/2024, 12:08 PM  Please see [email or web address] for pager details.  From 7A-7P if no response, please call [phone]. After hours, please call ELink [phone].

## 2024-11-23 NOTE — Plan of Care (Signed)
°  Problem: Education: Goal: Knowledge of General Education information will improve Description: Including pain rating scale, medication(s)/side effects and non-pharmacologic comfort measures Outcome: Not Progressing   Problem: Health Behavior/Discharge Planning: Goal: Ability to manage health-related needs will improve Outcome: Not Progressing   Problem: Clinical Measurements: Goal: Ability to maintain clinical measurements within normal limits will improve Outcome: Not Progressing Goal: Will remain free from infection Outcome: Not Progressing Goal: Diagnostic test results will improve Outcome: Not Progressing Goal: Respiratory complications will improve Outcome: Not Progressing Goal: Cardiovascular complication will be avoided Outcome: Not Progressing   Problem: Activity: Goal: Risk for activity intolerance will decrease Outcome: Not Progressing   Problem: Nutrition: Goal: Adequate nutrition will be maintained Outcome: Not Progressing   Problem: Coping: Goal: Level of anxiety will decrease Outcome: Not Progressing   Problem: Elimination: Goal: Will not experience complications related to bowel motility Outcome: Not Progressing Goal: Will not experience complications related to urinary retention Outcome: Not Progressing   Problem: Pain Managment: Goal: General experience of comfort will improve and/or be controlled Outcome: Not Progressing   Problem: Safety: Goal: Ability to remain free from injury will improve Outcome: Not Progressing   Problem: Skin Integrity: Goal: Risk for impaired skin integrity will decrease Outcome: Not Progressing   Problem: Education: Goal: Knowledge of disease or condition will improve Outcome: Not Progressing Goal: Knowledge of the prescribed therapeutic regimen will improve Outcome: Not Progressing Goal: Individualized Educational Video(s) Outcome: Not Progressing   Problem: Activity: Goal: Ability to tolerate increased  activity will improve Outcome: Not Progressing Goal: Will verbalize the importance of balancing activity with adequate rest periods Outcome: Not Progressing   Problem: Respiratory: Goal: Ability to maintain a clear airway will improve Outcome: Not Progressing Goal: Levels of oxygenation will improve Outcome: Not Progressing Goal: Ability to maintain adequate ventilation will improve Outcome: Not Progressing   Problem: Education: Goal: Ability to describe self-care measures that may prevent or decrease complications (Diabetes Survival Skills Education) will improve Outcome: Not Progressing Goal: Individualized Educational Video(s) Outcome: Not Progressing   Problem: Coping: Goal: Ability to adjust to condition or change in health will improve Outcome: Not Progressing   Problem: Fluid Volume: Goal: Ability to maintain a balanced intake and output will improve Outcome: Not Progressing   Problem: Health Behavior/Discharge Planning: Goal: Ability to identify and utilize available resources and services will improve Outcome: Not Progressing Goal: Ability to manage health-related needs will improve Outcome: Not Progressing   Problem: Metabolic: Goal: Ability to maintain appropriate glucose levels will improve Outcome: Not Progressing   Problem: Nutritional: Goal: Maintenance of adequate nutrition will improve Outcome: Not Progressing Goal: Progress toward achieving an optimal weight will improve Outcome: Not Progressing   Problem: Skin Integrity: Goal: Risk for impaired skin integrity will decrease Outcome: Not Progressing   Problem: Tissue Perfusion: Goal: Adequacy of tissue perfusion will improve Outcome: Not Progressing   Problem: Activity: Goal: Ability to tolerate increased activity will improve Outcome: Not Progressing   Problem: Respiratory: Goal: Ability to maintain a clear airway and adequate ventilation will improve Outcome: Not Progressing   Problem:  Role Relationship: Goal: Method of communication will improve Outcome: Not Progressing

## 2024-11-23 NOTE — Progress Notes (Signed)
 Nutrition Follow Up Note   DOCUMENTATION CODES:   Not applicable  INTERVENTION:   Change to Vital 1.5@60ml /hr + ProSource TF 20- Give 60ml daily via tube  Free water  flushes 200ml q4 hours  Regimen provides 2240kcal/day, 117g/day protein and 2300ml/day of free water .   NutriSource Fiber BID via tube   Daily weights   Juven Fruit Punch BID via tube, each serving provides 95kcal and 2.5g of protein (amino acids glutamine and arginine)  NUTRITION DIAGNOSIS:   Inadequate oral intake related to inability to eat (pt sedated and ventilated) as evidenced by NPO status. -ongoing   GOAL:   Provide needs based on ASPEN/SCCM guidelines -met   MONITOR:   Vent status, Labs, Weight trends, TF tolerance, I & O's, Skin  ASSESSMENT:   68 y/o male with h/o stage IV COPD, NSCLC left upper lobe s/p XRT, anemia, IDDM, HTN, OSA, DDD, chronic back pain, CHF, HLD, AAA, MDD, anxiety, OSA, TIA, gout, PVD, substance abuse and recent admission for respiratory failure with hypoxia who is admitted with parainfluenza, PNA and COPD exacerbation.  Pt remains sedated and ventilated. OGT in place and pt is tolerating tube feeds at goal rate. Rectal tube placed for diarrhea; fiber supplement added per PA. Free water  increased yesterday for hypernatremia; this is resolved today. Per chart, pt is down 4lbs since admission but appears to be at his UBW currently. Family deciding about trach/PEG.   Medications reviewed and include: aspirin , plavix , lovenox , insulin , solu-medrol , juven, protonix , levophed , propofol    Labs reviewed: K 3.6 wnl, BUN 82(H), creat 1.26(H), Mg 2.3 wnl Pro BNP 2454.0(H)- 12/7 Wbc- 22.3(H), Hgb 9.1(L), Hct 31.6(L) Cbgs- 253, 216, 165 x 24 hrs   Patient is currently intubated on ventilator support MV: 12.1 L/min Temp (24hrs), Avg:98.5 F (36.9 C), Min:97.7 F (36.5 C), Max:99.5 F (37.5 C)  Propofol : 27.3 ml/hr- provides 720kcal/day   MAP >24mmHg   UOP-   Diet Order:    Diet Order             Diet NPO time specified  Diet effective now                  EDUCATION NEEDS:   No education needs have been identified at this time  Skin:  Skin Assessment: Reviewed RN Assessment (L & R diabetic foot ulcer)  Last BM:  12/16- via rectal tube  Height:   Ht Readings from Last 1 Encounters:  11/16/24 5' 6 (1.676 m)    Weight:   Wt Readings from Last 1 Encounters:  11/23/24 75.2 kg    Ideal Body Weight:  64.5 kg  BMI:  Body mass index is 26.76 kg/m.  Estimated Nutritional Needs:   Kcal:  1900-2200kcal/day  Protein:  100-115g/day  Fluid:  1.7-1.9L/day  Christian Shams MS, RD, LDN If unable to be reached, please send secure chat to RD inpatient available from 8:00a-4:00p daily

## 2024-11-23 NOTE — Progress Notes (Signed)
 Endotracheal tube advances 2cm per provider order.  Tube is now positioned at 24cm at the lip.

## 2024-11-24 DIAGNOSIS — Z7189 Other specified counseling: Secondary | ICD-10-CM | POA: Diagnosis not present

## 2024-11-24 DIAGNOSIS — J9601 Acute respiratory failure with hypoxia: Secondary | ICD-10-CM | POA: Diagnosis not present

## 2024-11-24 DIAGNOSIS — J9612 Chronic respiratory failure with hypercapnia: Secondary | ICD-10-CM | POA: Diagnosis not present

## 2024-11-24 LAB — GLUCOSE, CAPILLARY
Glucose-Capillary: 134 mg/dL — ABNORMAL HIGH (ref 70–99)
Glucose-Capillary: 143 mg/dL — ABNORMAL HIGH (ref 70–99)
Glucose-Capillary: 212 mg/dL — ABNORMAL HIGH (ref 70–99)
Glucose-Capillary: 214 mg/dL — ABNORMAL HIGH (ref 70–99)
Glucose-Capillary: 239 mg/dL — ABNORMAL HIGH (ref 70–99)
Glucose-Capillary: 265 mg/dL — ABNORMAL HIGH (ref 70–99)

## 2024-11-24 LAB — CBC
HCT: 30.4 % — ABNORMAL LOW (ref 39.0–52.0)
Hemoglobin: 9.1 g/dL — ABNORMAL LOW (ref 13.0–17.0)
MCH: 28.9 pg (ref 26.0–34.0)
MCHC: 29.9 g/dL — ABNORMAL LOW (ref 30.0–36.0)
MCV: 96.5 fL (ref 80.0–100.0)
Platelets: 150 K/uL (ref 150–400)
RBC: 3.15 MIL/uL — ABNORMAL LOW (ref 4.22–5.81)
RDW: 15.5 % (ref 11.5–15.5)
WBC: 15.3 K/uL — ABNORMAL HIGH (ref 4.0–10.5)
nRBC: 0.1 % (ref 0.0–0.2)

## 2024-11-24 LAB — BASIC METABOLIC PANEL WITH GFR
Anion gap: 8 (ref 5–15)
BUN: 86 mg/dL — ABNORMAL HIGH (ref 8–23)
CO2: 35 mmol/L — ABNORMAL HIGH (ref 22–32)
Calcium: 9.3 mg/dL (ref 8.9–10.3)
Chloride: 97 mmol/L — ABNORMAL LOW (ref 98–111)
Creatinine, Ser: 1.12 mg/dL (ref 0.61–1.24)
GFR, Estimated: >60 mL/min (ref >60)
Glucose, Bld: 276 mg/dL — ABNORMAL HIGH (ref 70–99)
Potassium: 3.3 mmol/L — ABNORMAL LOW (ref 3.5–5.1)
Sodium: 141 mmol/L (ref 135–145)

## 2024-11-24 LAB — MAGNESIUM: Magnesium: 2.1 mg/dL (ref 1.7–2.4)

## 2024-11-24 LAB — PHOSPHORUS: Phosphorus: 3.1 mg/dL (ref 2.5–4.6)

## 2024-11-24 MED ORDER — POTASSIUM CHLORIDE 10 MEQ/100ML IV SOLN
10.0000 meq | INTRAVENOUS | Status: AC
  Administered 2024-11-24 (×4): 10 meq via INTRAVENOUS
  Filled 2024-11-24 (×4): qty 100

## 2024-11-24 MED ORDER — INSULIN GLARGINE 100 UNIT/ML ~~LOC~~ SOLN
8.0000 [IU] | Freq: Two times a day (BID) | SUBCUTANEOUS | Status: DC
  Administered 2024-11-24 – 2024-11-25 (×2): 8 [IU] via SUBCUTANEOUS
  Filled 2024-11-24 (×3): qty 0.08

## 2024-11-24 MED ORDER — FUROSEMIDE 10 MG/ML IJ SOLN
40.0000 mg | Freq: Once | INTRAMUSCULAR | Status: AC
  Administered 2024-11-24: 09:00:00 40 mg via INTRAVENOUS
  Filled 2024-11-24: qty 4

## 2024-11-24 MED ORDER — INSULIN ASPART 100 UNIT/ML IJ SOLN
4.0000 [IU] | INTRAMUSCULAR | Status: DC
  Administered 2024-11-24: 08:00:00 4 [IU] via SUBCUTANEOUS
  Filled 2024-11-24: qty 4

## 2024-11-24 MED ORDER — LACTATED RINGERS IV BOLUS
250.0000 mL | Freq: Once | INTRAVENOUS | Status: AC
  Administered 2024-11-24: 05:00:00 250 mL via INTRAVENOUS

## 2024-11-24 MED ORDER — INSULIN ASPART 100 UNIT/ML IJ SOLN
6.0000 [IU] | INTRAMUSCULAR | Status: DC
  Administered 2024-11-25 (×6): 6 [IU] via SUBCUTANEOUS
  Filled 2024-11-24 (×5): qty 6

## 2024-11-24 MED ORDER — INSULIN ASPART 100 UNIT/ML IJ SOLN
10.0000 [IU] | INTRAMUSCULAR | Status: DC
  Administered 2024-11-24: 20:00:00 10 [IU] via SUBCUTANEOUS
  Filled 2024-11-24: qty 10

## 2024-11-24 MED ORDER — INSULIN ASPART 100 UNIT/ML IJ SOLN
6.0000 [IU] | INTRAMUSCULAR | Status: DC
  Administered 2024-11-24 (×2): 6 [IU] via SUBCUTANEOUS
  Filled 2024-11-24 (×2): qty 6

## 2024-11-24 MED ORDER — INSULIN GLARGINE 100 UNIT/ML ~~LOC~~ SOLN
8.0000 [IU] | Freq: Every day | SUBCUTANEOUS | Status: DC
  Administered 2024-11-24: 14:00:00 8 [IU] via SUBCUTANEOUS
  Filled 2024-11-24 (×2): qty 0.08

## 2024-11-24 MED ORDER — POTASSIUM CHLORIDE 20 MEQ PO PACK
40.0000 meq | PACK | Freq: Once | ORAL | Status: AC
  Administered 2024-11-24: 05:00:00 40 meq
  Filled 2024-11-24: qty 2

## 2024-11-24 MED ORDER — NUTRISOURCE FIBER PO PACK
1.0000 | PACK | Freq: Three times a day (TID) | ORAL | Status: DC
  Administered 2024-11-24 – 2024-11-25 (×4): 1
  Filled 2024-11-24 (×5): qty 1

## 2024-11-24 NOTE — Progress Notes (Addendum)
 PHARMACY CONSULT NOTE  Pharmacy Consult for Electrolyte Monitoring and Replacement   Recent Labs: Potassium (mmol/L)  Date Value  11/24/2024 3.3 (L)  05/17/2014 3.5   Magnesium  (mg/dL)  Date Value  87/82/7974 2.1   Calcium  (mg/dL)  Date Value  87/82/7974 9.3   Calcium , Total (mg/dL)  Date Value  95/76/7984 9.3   Albumin  (g/dL)  Date Value  87/93/7974 3.7  09/24/2013 3.6   Phosphorus (mg/dL)  Date Value  87/82/7974 3.1   Sodium (mmol/L)  Date Value  11/24/2024 141  03/31/2014 136    Assessment:  68 y.o. male admitted on 11/12/2024 with sepsis and acute hypoxic on chronic hypercarbic respiratory failure. PMH significant for COPD on home oxygen  4 L, non-small cell lung cancer s/p RXT, OSA on CPAP, GERD, history of TIA, asthma, diabetes type 2, HLD, HTN, and CKD-3a. Patient was also recently admitted from 11/26-11/30 for COPD exacerbation. Pharmacy has been consulted for replacing electrolytes  Nutrition: Vital AF at 30 mL/hr + FWF 200 mL every 4h  Diuretics: furosemide  40 mg IV x 1  Goal of Therapy:  Electrolytes WNL  Plan:  --continue free water  at 200 mL every 4 hours --10 mEq IV KCl x 4 per NP --40 mEq KCl per tube x 1 per NP --Recheck electrolytes in am  [name] 11/24/2024 7:13 AM

## 2024-11-24 NOTE — Plan of Care (Signed)
°  Problem: Clinical Measurements: Goal: Diagnostic test results will improve Outcome: Progressing Goal: Cardiovascular complication will be avoided Outcome: Progressing   Problem: Elimination: Goal: Will not experience complications related to bowel motility Outcome: Progressing Goal: Will not experience complications related to urinary retention Outcome: Progressing   Problem: Pain Managment: Goal: General experience of comfort will improve and/or be controlled Outcome: Progressing   Problem: Tissue Perfusion: Goal: Adequacy of tissue perfusion will improve Outcome: Progressing

## 2024-11-24 NOTE — Inpatient Diabetes Management (Signed)
 Inpatient Diabetes Program Recommendations  AACE/ADA: New Consensus Statement on Inpatient Glycemic Control   Target Ranges:  Prepandial:   less than 140 mg/dL      Peak postprandial:   less than 180 mg/dL (1-2 hours)      Critically ill patients:  140 - 180 mg/dL    Latest Reference Range & Units 11/23/24 07:30 11/23/24 11:39 11/23/24 16:06 11/23/24 19:17 11/23/24 23:29 11/24/24 03:38 11/24/24 08:01  Glucose-Capillary 70 - 99 mg/dL 783 (H) 746 (H) 741 (H) 259 (H) 250 (H) 265 (H) 239 (H)   Review of Glycemic Control  Diabetes history: DM2 Outpatient Diabetes medications: Metformin  1000 mg daily Current orders for Inpatient glycemic control: Novolog  0-20 units Q4H, Novolog  4 units Q4H; Prednisone  30 mg QAM (tapering), Vital @ 60 ml/hr  Inpatient Diabetes Program Recommendations:    Insulin : CBGs 216-265 mg/dl over the past 24 hours. Noted steroids changed from Solumedrol to Prednisone  taper.  Please consider ordering Lantus  8 units Q24H and increasing tube feeding coverage to Novolog  6 units Q4H.  Thanks, Earnie Gainer, RN, MSN, CDCES Diabetes Coordinator Inpatient Diabetes Program [phone] (Team Pager from 8am to 5pm)

## 2024-11-24 NOTE — Progress Notes (Signed)
 NAME:  Christian Fields, MRN:  [id number], DOB:  16-Jun-1956, LOS: 12 ADMISSION DATE:  11/12/2024, CONSULTATION DATE:  11/15/2024 REFERRING MD:  Dr. Awanda, CHIEF COMPLAINT:  Acute Respiratory Distress   Brief Pt Description / Synopsis:  68 y.o. male with PMHx significant for COPD and lung cancer admitted with Acute on Chronic Hypoxic and Hypercapnic Respiratory Failure due to Acute COPD Exacerbation due to Parainfluenza Infection requiring BiPAP.  Failed trial of BiPAP on 11/16/24 requiring intubation and mechanical ventilation.   History of Present Illness:  Christian Fields is a 68 year old male with a history of lifelong smoking and advanced COPD, had a diagnosis of stage I left upper lobe lung cancer after noticing an interval increase in the size of nodule from 1 cm to 1.4 in 2021. He went straight to SBRT due to being a poor surgical candidate for biopsy. He also is having recurrent COPD exacerbations and having rehospitalization for this. He has chronic hypoxemia and uses oxygen  at home up to 5 L/min. He has chronic physical deconditioning. He has chronic peripheral edema and also follows up with cardiology for essential hypertension and dyslipidemia. He also has a background history of CKD 3 A, sleep apnea noncompliant with CPAP therapy, polysubstance abuse and abdominal aortic aneurysm. He came to the hospital on this admission with complaints of worsening shortness of breath x 1 day was noted to have oxygen  saturation approximately 60% on his usual 4 L/min nasal cannula he denies sick contacts or fevers at home. He had a venous blood gas performed in the ED which showed mild acidemia with hypercapnia. He was treated with nebulizer therapy and steroids as well as BiPAP and reports slight improvement since then. He is started on empiric antibiotics with cefepime  and vancomycin  for possible pneumonia. PCCM consultation placed due to recurrent hospitalization with advanced COPD, chronic hypoxemia and lung  cancer.    Please see Significant Hospital Events section below for full detailed hospital course.   Pertinent  Medical History   Past Medical History:  Diagnosis Date   Anemia    Anxiety    Arthritis    Asthma    Cancer (HCC)    Basal Cell Skin Cancer   Chronic back pain    COPD (chronic obstructive pulmonary disease) (HCC)    Depression    Diabetes mellitus (HCC)    Dyspnea    GERD (gastroesophageal reflux disease)    Gout    Gout    Headache    History of blood clots    Left Leg--July 2018   History of kidney stones    Hyperlipidemia    Hyperlipidemia    Hypertension    Kidney stones    Neuropathy    On home oxygen  therapy    2 L / M   Pneumonia 06/2017   Sleep apnea    Ulcer of foot (HCC)    Right    Micro Data:  12/5: COVID/FLU/RSV PCR>> negative 12/5: RVP >> + Parainfluenza  12/5: MRSA PCR>> negative  12/9: Tracheal aspirate>> + enterococcus faecalis 12/9: Strep pneumo urinary antigen>>negative 12/9: Legionella urinary antigen>>negative 12/13: Blood cultures x2>>no growth to date 12/13: Repeat Tracheal aspirate>> few candida albicans  Antimicrobials:   Anti-infectives (From admission, onward)    Start     Dose/Rate Route Frequency Ordered Stop   11/19/24 0800  vancomycin  (VANCOCIN ) IVPB 1000 mg/200 mL premix  Status:  Discontinued        1,000 mg 200 mL/hr over 60 Minutes Intravenous Every  24 hours 11/18/24 1253 11/19/24 1036   11/18/24 1300  vancomycin  (VANCOREADY) IVPB 1250 mg/250 mL        1,250 mg 166.7 mL/hr over 90 Minutes Intravenous  Once 11/18/24 1129 11/18/24 1333   11/16/24 1200  piperacillin -tazobactam (ZOSYN ) IVPB 3.375 g  Status:  Discontinued        3.375 g 12.5 mL/hr over 240 Minutes Intravenous Every 8 hours 11/16/24 1110 11/23/24 1058   11/15/24 1215  azithromycin  (ZITHROMAX ) 500 mg in sodium chloride  0.9 % 250 mL IVPB  Status:  Discontinued        500 mg 250 mL/hr over 60 Minutes Intravenous Every 24 hours 11/15/24 1129  11/18/24 1016   11/15/24 1215  cefTRIAXone  (ROCEPHIN ) 2 g in sodium chloride  0.9 % 100 mL IVPB  Status:  Discontinued        2 g 200 mL/hr over 30 Minutes Intravenous Every 24 hours 11/15/24 1129 11/16/24 1030   11/13/24 1900  vancomycin  (VANCOREADY) IVPB 1250 mg/250 mL  Status:  Discontinued        1,250 mg 166.7 mL/hr over 90 Minutes Intravenous Every 24 hours 11/12/24 1744 11/12/24 1749   11/13/24 1000  azithromycin  (ZITHROMAX ) tablet 250 mg  Status:  Discontinued        250 mg Oral Daily 11/13/24 0819 11/15/24 1129   11/13/24 0500  piperacillin -tazobactam (ZOSYN ) IVPB 3.375 g  Status:  Discontinued        3.375 g 12.5 mL/hr over 240 Minutes Intravenous Every 8 hours 11/12/24 2112 11/13/24 0814   11/12/24 1900  vancomycin  (VANCOREADY) IVPB 1750 mg/350 mL  Status:  Discontinued        1,750 mg 175 mL/hr over 120 Minutes Intravenous  Once 11/12/24 1744 11/12/24 1749   11/12/24 1700  ceFEPIme  (MAXIPIME ) 2 g in sodium chloride  0.9 % 100 mL IVPB  Status:  Discontinued        2 g 200 mL/hr over 30 Minutes Intravenous Every 12 hours 11/12/24 1615 11/12/24 1745       Significant Hospital Events: Including procedures, antibiotic start and stop dates in addition to other pertinent events   12/5: Admitted by [facility].  Pulmonary consulted for severe AECOPD requiring BiPAP. 11/14/24-patient with Parainfluenza induced AECOPD. He was moved to Step down unit due to progressive dyspnea and is on BIPAP. He is on solumedrol BID and nebulizer therapy. Will continue to follow. Anticipate slow improvement over next 3-5 weeks with this virus. Hopefully dc in 3-4days.  11/15/24: Mid morning pt with increased WOB and accessory muscle use.  PCCM consulted.  Given 40 mg Lasix  by [name].  Increase duonebs to q4h and start Ceftriaxone  and increase Azithromycin  dose to 500 mg.  High risk for intubation.  11/16/24: Acutely decompensated, became severely hypoxic (sats 60's) with increased WOB, required intubation and mechanical  ventilation. Given acute decompensation and persistent sinus tachycardia, CTa obtained which is negative for PE, does show possible superimposed bacterial pneumonia.  Ceftriaxone  broadened to Zosyn . Telemetry alarming for ST elevation, obtained EKG and reviewed with Dr. Mady of Cardiology, felt not to be STEMI, obtaining Troponin and Echo. 11/17/24: No acute events overnight. Cr worsening at 1.41 this am. Remains on Zosyn . Awake and alert this am, transitioned to SBT but failed quickly. Became tachycardic and hypertensive. Restarted sedation.  11/18/24: No acute overnight events. Remains critically ill requiring mechanical ventilation. Will perform another SBT today with precedex .  11/19/24 11/20/24: 11/21/24: 11/22/24: Remains critically ill requiring mechanical ventilation. On dilaudid  and propofol  gtt, 3mcg of  levo. Worsening hypercapnia overnight requiring vecuronium  administration and versed .. Repeat ABG pending. 11/23/24:  on PCV, requiring heavy sedation. Diuresed with 40 mg Lasix  later in the day, Creatinine improved with diuresis and UOP 2.6 L (net +5.7L) 11/24/24: No acute events overnight.  Afebrile, requiring 2 mcg Levophed .  Remains on vent, currently 50% FiO2, no SBT today as pt with increased WOB on full support and sedation.  Will diurese with 40 mg Lasix  x1 dose.   Interim History / Subjective:  As outlined above under Significant Hospital Events section   Objective   Blood pressure 93/61, pulse 69, temperature 97.8 F (36.6 C), temperature source Axillary, resp. rate 12, height 5' 6 (1.676 m), weight 78.5 kg, SpO2 93%.    Vent Mode: PCV FiO2 (%):  [40 %-50 %] 50 % Set Rate:  [18 bmp-25 bmp] 20 bmp PEEP:  [8 cmH20] 8 cmH20 Plateau Pressure:  [19 cmH20] 19 cmH20   Intake/Output Summary (Last 24 hours) at 11/24/2024 0804 Last data filed at 11/24/2024 0730 Gross per 24 hour  Intake 3698.72 ml  Output 2675 ml  Net 1023.72 ml   Filed Weights   11/22/24 0500 11/23/24 0500  11/24/24 0500  Weight: 71.2 kg 75.2 kg 78.5 kg    Examination: General: critically ill appearing on mech vent HEENT: MM pink/moist; ETT in place Neuro: sedate; when trying to wean sedation became agitated and mae spontaneously, not following commands CV: s1s2, RRR, no m/r/g PULM:  dim clear BS bilaterally; on mech vent pcv GI: soft, bsx4 active  Extremities: warm/dry, no edema  Skin: no rashes or lesions    Resolved Hospital Problem list      Assessment & Plan:   #Sedation needs in setting of mechanical ventilation Hx of Chronic pain syndrome, Depression. Anxiety, sleep apnea -Maintain a RASS goal of 0 to -1 -Dilaudid  and Propofol  to maintain RASS goal -Avoid sedating medications as able -Daily wake up assessment ~ utilize Precedex  for WUA -Continue home gabapentin  at half dose and trazodone ; continue seroquel   #Acute on chronic Hypoxic & Hypercapnic Respiratory Failure #Acute COPD Exacerbation d/t Parainfluenza Viral Infection and Aspiration PNA #Superimposed Bacterial Pneumonia (+ Enterococcus Faecalis) #Lung Cancer Hx: COPD on home oxygen , Asthma  CTa Chest PE 12/9: negative for PE, new multifocal airspace consolidation in RLL, RML, and LLL, stable 2.6 cm pleural based mass like consolidation in lateral LUL, enlarged central pulmonary arteries suggesting pulmonary artery hypertension  -Full vent support, implement lung protective strategies -Plateau pressures less than 30 cm H20 -Wean FiO2 & PEEP as tolerated to maintain O2 sats >92% -Follow intermittent Chest X-ray & ABG as needed -Spontaneous Breathing Trials when respiratory parameters met and mental status permits -Implement VAP Bundle -Bronchodilators: Triple therapy maintenance nebs; continue home daliresp ; prn duoneb -Continue Prednisone  taper -Completed ABX as above -Diuresis as BP and renal function permits ~ give 40 mg IV Lasix  on 12/17  #Tachycardia, likely compensatory in nature d/t respiratory  failure #Mildly Elevated Troponin, suspect demand ischemia  Hx: HTN, HLD 12/9: Telemetry alarming ST elevation, EKG with ST elevation in inferior leads (but no reciprocal changes), reviewed with Dr. Mady, felt not to be a STEMI 11/17/24 ECHO: LVEF 60-65%, mild LVH, severely elevated pulmonary artery pressure with mildly reduced RV systolic function; moderate thickening of aortic valve.  Plan: -tele monitoring -cont home plavix , statin and ezetimibe   #Mild AKI on CKD 3a -Monitor I&O's / urinary output -Follow BMP -Ensure adequate renal perfusion -Avoid nephrotoxic agents as able -Replace electrolytes as indicated ~  Pharmacy following for assistance with electrolyte replacement  #GERD -PPI  #Type II Diabetes Mellitus -CBG's q4h; Target range of 140 to 180 -SSI -Follow ICU Hypo/Hyperglycemia protocol  #Parainfluenza Viral Infection #Superimposed bacterial pneumonia: Enterococcus Faecalis -Monitor fever curve -Trend WBC's  -Follow cultures as above -Completed course of antibiotics as above    Best Practice (right click and Reselect all SmartList Selections daily)   Diet/type: tube feeds DVT prophylaxis: LMWH GI prophylaxis: PPI Lines: right radial arterial line Foley:  yes, and still needed  Code Status:  DNR Last date of multidisciplinary goals of care discussion [12/17]  12/17: Updated pt's daughter at bedside.   Labs   CBC: Recent Labs  Lab 11/20/24 0331 11/21/24 0315 11/22/24 0332 11/23/24 0352 11/24/24 0336  WBC 23.7* 27.3* 22.0* 22.3* 15.3*  HGB 10.9* 11.2* 10.3* 9.1* 9.1*  HCT 37.2* 39.8 36.1* 31.6* 30.4*  MCV 97.4 100.5* 100.6* 99.1 96.5  PLT 201 187 147* 148* 150    Basic Metabolic Panel: Recent Labs  Lab 11/18/24 0332 11/19/24 0336 11/20/24 0331 11/21/24 0315 11/22/24 0332 11/23/24 0352 11/24/24 0336  NA 139 140 141 144 146* 143 141  K 4.3 4.5 4.1 4.2 4.1 3.6 3.3*  CL 97* 100 100 100 103 100 97*  CO2 34* 32 31 33* 34* 34* 35*  GLUCOSE  174* 201* 126* 167* 195* 189* 276*  BUN 57* 59* 50* 60* 69* 82* 86*  CREATININE 1.39* 1.28* 1.23 1.27* 1.42* 1.26* 1.12  CALCIUM  8.7* 8.8* 9.1 9.1 9.5 9.4 9.3  MG 2.1 2.2 2.2 2.3 2.4 2.3 2.1  PHOS 3.0 3.0  --   --   --   --  3.1   GFR: Estimated Creatinine Clearance: 61.4 mL/min (by C-G formula based on SCr of 1.12 mg/dL). Recent Labs  Lab 11/21/24 0315 11/22/24 0332 11/23/24 0352 11/24/24 0336  WBC 27.3* 22.0* 22.3* 15.3*    Liver Function Tests: No results for input(s): AST, ALT, ALKPHOS, BILITOT, PROT, ALBUMIN  in the last 168 hours.  No results for input(s): LIPASE, AMYLASE in the last 168 hours. No results for input(s): AMMONIA in the last 168 hours.  ABG    Component Value Date/Time   PHART 7.39 11/23/2024 0501   PCO2ART 60 (H) 11/23/2024 0501   PO2ART 65 (L) 11/23/2024 0501   HCO3 36.3 (H) 11/23/2024 0501   ACIDBASEDEF 1.4 11/03/2024 1854   O2SAT 96.2 11/23/2024 0501     Coagulation Profile: No results for input(s): INR, PROTIME in the last 168 hours.   Cardiac Enzymes: No results for input(s): CKTOTAL, CKMB, CKMBINDEX, TROPONINI in the last 168 hours.  HbA1C: Hemoglobin A1C  Date/Time Value Ref Range Status  09/25/2013 03:56 AM 6.3 4.2 - 6.3 % Final    Comment:    The American Diabetes Association recommends that a primary goal of therapy should be <7% and that physicians should reevaluate the treatment regimen in patients with HbA1c values consistently >8%.    Hgb A1c MFr Bld  Date/Time Value Ref Range Status  07/16/2024 03:19 AM 6.0 (H) 4.8 - 5.6 % Final    Comment:    (NOTE)         Prediabetes: 5.7 - 6.4         Diabetes: >6.4         Glycemic control for adults with diabetes: <7.0   10/17/2023 04:51 AM 5.7 (H) 4.8 - 5.6 % Final    Comment:    (NOTE) Pre diabetes:  5.7%-6.4%  Diabetes:              >6.4%  Glycemic control for   <7.0% adults with diabetes     CBG: Recent Labs  Lab 11/23/24 1606  11/23/24 1917 11/23/24 2329 11/24/24 0338 11/24/24 0801  GLUCAP 258* 259* 250* 265* 239*    Review of Systems:   Unable to assess due to intubation/sedation/critical illness   Past Medical History:  He,  has a past medical history of Anemia, Anxiety, Arthritis, Asthma, Cancer (HCC), Chronic back pain, COPD (chronic obstructive pulmonary disease) (HCC), Depression, Diabetes mellitus (HCC), Dyspnea, GERD (gastroesophageal reflux disease), Gout, Gout, Headache, History of blood clots, History of kidney stones, Hyperlipidemia, Hyperlipidemia, Hypertension, Kidney stones, Neuropathy, On home oxygen  therapy, Pneumonia (06/2017), Sleep apnea, and Ulcer of foot (HCC).   Surgical History:   Past Surgical History:  Procedure Laterality Date   APPENDECTOMY     DG FEET 2 VIEWS BILAT     IRRIGATION AND DEBRIDEMENT FOOT Left 07/11/2023   Procedure: IRRIGATION AND DEBRIDEMENT FOOT;  Surgeon: Ashley Soulier, DPM;  Location: [facility] ORS;  Service: Orthopedics/Podiatry;  Laterality: Left;   LIPOMA EXCISION Right 08/15/2017   Procedure: EXCISION TUMOR(CYST) FOOT;  Surgeon: Lilli Cough, DPM;  Location: [facility] ORS;  Service: Podiatry;  Laterality: Right;   LOWER EXTREMITY ANGIOGRAPHY Left 07/03/2023   Procedure: Lower Extremity Angiography;  Surgeon: Marea Selinda RAMAN, MD;  Location: [facility] IN[facility];  Service: Cardiovascular;  Laterality: Left;   OTHER SURGICAL HISTORY Bilateral Foot surgery     Social History:   reports that he has quit smoking. His smoking use included cigarettes. He started smoking about 7 years ago. He has a 4 pack-year smoking history. He has never used smokeless tobacco. He reports current alcohol  use of about 1.0 standard drink of alcohol  per week. He reports that he does not use drugs.   Family History:  His family history includes Hypertension in his mother.   Allergies Allergies  Allergen Reactions   Bee Venom Anaphylaxis   Linezolid  Other (See Comments)    Thrombocytopenia,  hyperlactatemia   Azithromycin  Itching and Rash     Home Medications  Prior to Admission medications   Medication Sig Start Date End Date Taking? Authorizing Provider  acetaminophen  (TYLENOL ) 325 MG tablet Take 650 mg by mouth every 6 (six) hours as needed.   Yes [provider]  albuterol  (VENTOLIN  HFA) 108 (90 Base) MCG/ACT inhaler Inhale 2 puffs into the lungs every 4 (four) hours as needed for wheezing or shortness of breath.   Yes [provider]  allopurinol  (ZYLOPRIM ) 300 MG tablet Take 600 mg by mouth daily. 02/24/20  Yes [provider]  aspirin  EC 81 MG tablet Take 1 tablet by mouth daily.   Yes [provider]  azelastine  (ASTELIN ) 0.1 % nasal spray Place 2 sprays into both nostrils 2 (two) times daily as needed. 05/27/23  Yes [provider]  Buprenorphine  HCl-Naloxone  HCl 8-2 MG FILM Place 1 Film under the tongue in the morning, at noon, and at bedtime.   Yes [provider]  clopidogrel  (PLAVIX ) 75 MG tablet Take 1 tablet (75 mg total) by mouth daily. 02/02/21  Yes Leotis Bogus, MD  dapagliflozin  propanediol (FARXIGA ) 10 MG TABS tablet Take 1 tablet by mouth daily. 03/16/24 03/16/25 Yes [provider]  DULoxetine  (CYMBALTA ) 60 MG capsule Take 60 mg by mouth daily.   Yes [provider]  ezetimibe  (ZETIA ) 10 MG tablet Take 10 mg by mouth  daily. 02/24/20  Yes [provider]  fluticasone  (FLONASE ) 50 MCG/ACT nasal spray Place 1 spray into both nostrils daily as needed for allergies. 10/16/18  Yes [provider]  Fluticasone -Umeclidin-Vilant (TRELEGY ELLIPTA) 100-62.5-25 MCG/ACT AEPB Inhale 1 puff into the lungs in the morning. 09/12/21  Yes [provider]  furosemide  (LASIX ) 20 MG tablet Take 1 tablet (20 mg total) by mouth daily. 02/23/24  Yes Jens Durand, MD  gabapentin  (NEURONTIN ) 800 MG tablet Take 800 mg by mouth 4 (four) times daily. 10/22/23  Yes [provider]   ipratropium-albuterol  (DUONEB) 0.5-2.5 (3) MG/3ML SOLN Take 3 mLs by nebulization every 6 (six) hours as needed (wheezing).   Yes [provider]  metFORMIN  (GLUCOPHAGE ) 1000 MG tablet Take 1,000 mg by mouth daily.   Yes [provider]  metoprolol  succinate (TOPROL -XL) 50 MG 24 hr tablet Take 50 mg by mouth daily. 02/24/20  Yes [provider]  Multiple Vitamins-Minerals (MULTIVITAMIN ADULT, MINERALS, PO) Take 1 tablet by mouth every morning.   Yes [provider]  naloxone  (NARCAN ) nasal spray 4 mg/0.1 mL Place 1 spray into the nose once.   Yes [provider]  OHTUVAYRE  3 MG/2.5ML SUSP Take 5 mLs by mouth daily. 07/05/24  Yes [provider]  ondansetron  (ZOFRAN -ODT) 4 MG disintegrating tablet Take 4 mg by mouth every 8 (eight) hours as needed for nausea.   Yes [provider]  OXYGEN  Inhale 4 L into the lungs.   Yes [provider]  predniSONE  (DELTASONE ) 20 MG tablet 60mg  daily x 2 days, 40mg  daily x 2 days, 20mg  daily x 2 days then stop. 11/07/24  Yes Trudy Anthony HERO, MD  roflumilast  (DALIRESP ) 500 MCG TABS tablet Take 500 mcg by mouth daily. 08/15/21  Yes [provider]  simvastatin  (ZOCOR ) 40 MG tablet Take 40 mg by mouth at bedtime. 01/17/16  Yes [provider]  Syringe/Needle, Disp, (SYRINGE 3CC/25GX1) 25G X 1 3 ML MISC 1 Syringe by Does not apply route every 30 (thirty) days. 07/12/22  Yes Brahmanday, Govinda R, MD  traZODone  (DESYREL ) 50 MG tablet Take 50 mg by mouth at bedtime as needed for sleep. 01/22/24 01/21/25 Yes [provider]  bisacodyl  (DULCOLAX) 5 MG EC tablet Take 1 tablet (5 mg total) by mouth daily as needed for moderate constipation. Patient not taking: Reported on 11/03/2024 09/04/23   Dorinda Drue DASEN, MD     Critical care time: 40 minutes    Inge Lecher, AGACNP-BC [facility] Prefer epic messenger for cross cover needs If after hours, please  call E-link

## 2024-11-24 NOTE — Progress Notes (Addendum)
 Daily Progress Note   Patient Name: Christian Fields       Date: 11/24/2024 DOB: [birth year]  Age: 68 y.o. MRN#: [id number] Attending Physician: Isaiah Scrivener, MD Primary Care Physician: Lora Odor, FNP Admit Date: 11/12/2024  Reason for Consultation/Follow-up: Establishing goals of care  Subjective: Notes and labs reviewed.  Patient intubated 11/16/2024.  Patient remains ventilator dependent.  Spoke with nursing for updates.  No SBT attempted today.  No family at bedside.  Goals are currently for DNR status.  No tracheostomy.  Time for outcomes.  Length of Stay: 12  Current Medications: Scheduled Meds:   arformoterol   15 mcg Nebulization Q12H   aspirin   81 mg Per Tube Daily   budesonide  (PULMICORT ) nebulizer solution  0.5 mg Nebulization BID   Chlorhexidine  Gluconate Cloth  6 each Topical Q0600   clopidogrel   75 mg Per Tube Daily   enoxaparin  (LOVENOX ) injection  40 mg Subcutaneous Q24H   ezetimibe   10 mg Per Tube Daily   feeding supplement (PROSource TF20)  60 mL Per Tube Daily   fiber  1 packet Per Tube TID   free water   200 mL Per Tube Q4H   gabapentin   400 mg Per Tube QID   insulin  aspart  0-20 Units Subcutaneous Q4H   insulin  aspart  6 Units Subcutaneous Q4H   insulin  glargine  8 Units Subcutaneous Daily   nutrition supplement (JUVEN)  1 packet Per Tube BID BM   mouth rinse  15 mL Mouth Rinse Q2H   pantoprazole  (PROTONIX ) IV  40 mg Intravenous QHS   [START ON 12/01/2024] predniSONE   30 mg Per Tube Q breakfast   Followed by   NOREEN ON 11/26/2024] predniSONE   20 mg Per Tube Q breakfast   Followed by   NOREEN ON 11/27/2024] predniSONE   10 mg Per Tube Q breakfast   QUEtiapine   25 mg Per Tube BID   revefenacin   175 mcg Nebulization Daily   roflumilast   500 mcg Per Tube  Daily   simvastatin   40 mg Per Tube QHS   traZODone   50 mg Per Tube QHS    Continuous Infusions:  dexmedetomidine  (PRECEDEX ) IV infusion 0.7 mcg/kg/hr (11/24/24 1202)   feeding supplement (VITAL 1.5 CAL) 1,000 mL (11/24/24 1134)   HYDROmorphone  3 mg/hr (11/24/24 0600)   norepinephrine  (LEVOPHED ) Adult infusion 2  mcg/min (11/24/24 0600)   propofol  (DIPRIVAN ) infusion 50 mcg/kg/min (11/24/24 1047)    PRN Meds: acetaminophen  **OR** acetaminophen , artificial tears, HYDROmorphone , ipratropium-albuterol , liver oil-zinc  oxide, midazolam  PF, ondansetron  **OR** ondansetron  (ZOFRAN ) IV, mouth rinse, polyethylene glycol  Physical Exam Constitutional:      Comments: Eyes closed  Pulmonary:     Comments: Ventilator dependent            Vital Signs: BP 98/63 (BP Location: Left Arm)   Pulse 72   Temp 97.7 F (36.5 C) (Axillary)   Resp (!) 9   Ht 5' 6 (1.676 m)   Wt 78.5 kg   SpO2 91%   BMI 27.93 kg/m  SpO2: SpO2: 91 % O2 Device: O2 Device: Ventilator O2 Flow Rate: O2 Flow Rate (L/min): 6 L/min  Intake/output summary:  Intake/Output Summary (Last 24 hours) at 11/24/2024 1506 Last data filed at 11/24/2024 1400 Gross per 24 hour  Intake 3368.66 ml  Output 3525 ml  Net -156.34 ml   LBM: Last BM Date : 11/24/24 Baseline Weight: Weight: 77 kg Most recent weight: Weight: 78.5 kg   Patient Active Problem List   Diagnosis Date Noted   Acute hypoxic on chronic hypercapnic respiratory failure (HCC) 11/12/2024   Acute on chronic combined systolic and diastolic CHF (congestive heart failure) (HCC) 11/03/2024   Acute on chronic combined systolic and diastolic congestive heart failure (HCC) 11/03/2024   CKD stage 3a, GFR 45-59 ml/min (HCC) 07/15/2024   Acute CHF (congestive heart failure) (HCC) 02/20/2024   Acute on chronic diastolic CHF (congestive heart failure) (HCC) 02/19/2024   Chronic respiratory failure with hypoxia (HCC) 02/19/2024   COPD exacerbation (HCC) 02/06/2024   Type  II diabetes mellitus with renal manifestations (HCC) 02/06/2024   CKD (chronic kidney disease) stage 2, GFR 60-89 ml/min 02/06/2024   Anxiety 10/20/2023   Sinus tachycardia 10/20/2023   Acute respiratory failure with hypoxia and hypercapnia (HCC) 10/19/2023   Hypomagnesemia 10/18/2023   Tremor of both hands 10/18/2023   Acute hypoxic respiratory failure (HCC) 10/06/2023   Respiratory distress 10/04/2023   Anemia 10/04/2023   Erythematous rash 08/01/2023   Thrombocytopenia 08/01/2023   Zinc  deficiency 08/01/2023   Decreased oral intake 07/27/2023   Elevated serum creatinine 07/27/2023   Acute osteomyelitis of left calcaneus (HCC) 07/11/2023   Diabetic infection of left foot (HCC) 07/11/2023   History of osteomyelitis 07/10/2023   Cellulitis and abscess of toe of left foot 07/03/2023   PVD (peripheral vascular disease) 07/03/2023   Diabetic foot ulcer (HCC) 07/02/2023   HLD (hyperlipidemia) 07/02/2023   Depression with anxiety 07/02/2023   Elevated lactic acid level 07/02/2023   Abnormal LFTs 07/02/2023   COPD (chronic obstructive pulmonary disease) (HCC) 07/02/2023   Diarrhea 07/02/2023   Increased band cell count 04/18/2023   History of ankle fusion (Right) 12/31/2021   Chronic anticoagulation (Plavix ) 12/31/2021   Long term prescription benzodiazepine use (Klonopin ) 12/31/2021   DDD (degenerative disc disease), cervical 12/31/2021   Pharmacologic therapy 10/31/2021   Disorder of skeletal system 10/31/2021   Problems influencing health status 10/31/2021   Aspiration pneumonia (HCC)    Sepsis (HCC) 08/05/2021   Gout 08/05/2021   Depression 08/05/2021   Chronic heart failure with preserved ejection fraction (HFpEF) (HCC) 08/05/2021   HCAP (healthcare-associated pneumonia) 08/05/2021   Pain of left lower extremity 03/25/2021   Acute on chronic respiratory failure with hypoxia (HCC) 03/08/2021   Septic shock (HCC) 02/26/2021   Polysubstance (excluding opioids) dependence (HCC)  01/30/2021   Acute metabolic  encephalopathy 01/29/2021   COPD with acute exacerbation (HCC) 01/29/2021   Iron  deficiency anemia 11/27/2020   Severe sepsis (HCC)    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   Left leg swelling 04/28/2020   Confusion    Acute respiratory failure with hypoxia (HCC) 03/26/2020   AKI (acute kidney injury) 01/13/2020   Cellulitis of left leg 01/12/2020   COVID-19 virus detected 01/12/2020   Diabetic ulcer of left midfoot associated with type 2 diabetes mellitus, with fat layer exposed (HCC) 01/12/2020   Panlobular emphysema (HCC) 01/12/2020   Arthritis 10/29/2019   Hemorrhoids 10/29/2019   Senile nuclear sclerosis, bilateral 02/15/2019   Cellulitis of left upper limb 02/11/2019   AAA (abdominal aortic aneurysm) without rupture 01/15/2019   Localized swelling, mass and lump, upper limb 12/24/2018   Pain in femur 12/24/2018   TIA (transient ischemic attack) 11/18/2018   Cortical age-related cataract of both eyes 11/02/2018   Fracture of multiple ribs 04/20/2018   Overweight (BMI 25.0-29.9) 04/20/2018   Hypertension 07/08/2017   Insulin  dependent type 2 diabetes mellitus (HCC) 07/08/2017   Tobacco use disorder 07/08/2017   Atherosclerosis of native arteries of extremity with intermittent claudication 07/08/2017   SOB (shortness of breath) 06/19/2017   Acute on chronic respiratory failure with hypoxia and hypercapnia (HCC) 12/15/2016   CAP (community acquired pneumonia) 12/15/2016   Chronic pain syndrome 12/15/2016   Chronically on opiate therapy 12/15/2016   History of kidney stones 12/15/2016   Polypharmacy 12/15/2016   Somnolence 12/15/2016   Foot ulcer (HCC) 01/19/2016   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) 11/29/2015   Other psychoactive substance use, unspecified with withdrawal, uncomplicated (HCC) 11/29/2015   Type 2 diabetes mellitus (HCC) 11/27/2015   Chronic obstructive pulmonary disease, unspecified (HCC) 01/18/2013   Depressive  disorder 01/18/2013   Hereditary and idiopathic peripheral neuropathy 01/18/2013   Sleep apnea 01/18/2013   Low back pain 11/23/2010   Acute gouty arthropathy 07/10/2010   Problem related to lifestyle 10/29/2004    Palliative Care Assessment & Plan    Recommendations/Plan: Patient remains ventilator dependent.  Daughter advises family would not want tracheostomy.  Time for outcomes.  Code Status:    Code Status Orders  (From admission, onward)           Start     Ordered   11/23/24 1201  Do not attempt resuscitation (DNR) Pre-Arrest Interventions Desired  (Code Status)  Continuous       Question Answer Comment  If pulseless and not breathing No CPR or chest compressions.   In Pre-Arrest Conditions (Patient Has Pulse and Is Breathing) May intubate, use advanced airway interventions and cardioversion/ACLS medications if appropriate or indicated. May transfer to ICU.   Consent: Discussion documented in EHR or advanced directives reviewed      11/23/24 1203           Code Status History     Date Active Date Inactive Code Status Order ID Comments User Context   11/12/2024 1605 11/23/2024 1203 Full Code [id number]  Roann Gouty, MD ED   11/03/2024 2040 11/07/2024 1957 Full Code [id number]  Hilma Rankins, MD ED   07/15/2024 1514 07/17/2024 1907 Full Code [id number]  Cox, Amy N, DO ED   02/19/2024 2155 02/23/2024 1739 Full Code [id number]  Cleatus Delayne GAILS, MD ED   02/06/2024 2010 02/09/2024 1956 Full Code [id number]  Hilma Rankins, MD ED   10/16/2023 1419 10/22/2023 2300 Full Code [id number]  Arnett Saunders, MD ED   10/04/2023 2015 10/09/2023 1921  Full Code [id number]  Tobie Mario GAILS, MD ED   08/30/2023 1344 09/04/2023 2218 Full Code [id number]  Laurita Cort DASEN, MD ED   07/10/2023 1836 07/14/2023 2240 Full Code [id number]  Dorinda Drue DASEN, MD Inpatient   07/10/2023 1828 07/10/2023 1836 Full Code [id number]  Dorinda Drue DASEN, MD Inpatient   07/02/2023 1428 07/04/2023 1548 Full Code [id number]  Hilma Rankins, MD ED    08/05/2021 1855 08/07/2021 1849 Full Code [id number]  Hilma Rankins, MD ED   08/02/2021 2141 08/04/2021 2137 Full Code [id number]  Sim Emery CROME, MD Inpatient   02/26/2021 2157 03/09/2021 1758 Full Code [id number]  Rust-Chester, Jenita CROME, NP ED   01/29/2021 2351 02/01/2021 1722 Full Code [id number]  Cleatus Delayne GAILS, MD ED   11/12/2020 2253 11/18/2020 2110 Full Code [id number]  Harlow Ozell BRAVO, MD ED   03/26/2020 0136 03/28/2020 2308 Full Code [id number]  Prentiss Dorothyann Maxwell, DO ED   01/19/2016 2021 01/20/2016 1818 Full Code [id number]  Yisroel Sleight, MD Inpatient      Camelia Lewis, NP  Please contact Palliative Medicine Team phone at [phone] for questions and concerns.

## 2024-11-25 ENCOUNTER — Inpatient Hospital Stay

## 2024-11-25 DIAGNOSIS — J189 Pneumonia, unspecified organism: Secondary | ICD-10-CM

## 2024-11-25 DIAGNOSIS — R000 Tachycardia, unspecified: Secondary | ICD-10-CM

## 2024-11-25 DIAGNOSIS — R7989 Other specified abnormal findings of blood chemistry: Secondary | ICD-10-CM

## 2024-11-25 DIAGNOSIS — Z515 Encounter for palliative care: Secondary | ICD-10-CM

## 2024-11-25 LAB — RENAL FUNCTION PANEL
Albumin: 2.9 g/dL — ABNORMAL LOW (ref 3.5–5.0)
Anion gap: 8 (ref 5–15)
BUN: 74 mg/dL — ABNORMAL HIGH (ref 8–23)
CO2: 34 mmol/L — ABNORMAL HIGH (ref 22–32)
Calcium: 8.9 mg/dL (ref 8.9–10.3)
Chloride: 97 mmol/L — ABNORMAL LOW (ref 98–111)
Creatinine, Ser: 1.05 mg/dL (ref 0.61–1.24)
GFR, Estimated: >60 mL/min (ref >60)
Glucose, Bld: 133 mg/dL — ABNORMAL HIGH (ref 70–99)
Phosphorus: 3.4 mg/dL (ref 2.5–4.6)
Potassium: 4.6 mmol/L (ref 3.5–5.1)
Sodium: 139 mmol/L (ref 135–145)

## 2024-11-25 LAB — BLOOD GAS, ARTERIAL
Acid-Base Excess: 11.4 mmol/L — ABNORMAL HIGH (ref 0.0–2.0)
Acid-Base Excess: 12.6 mmol/L — ABNORMAL HIGH (ref 0.0–2.0)
Bicarbonate: 41.3 mmol/L — ABNORMAL HIGH (ref 20.0–28.0)
Bicarbonate: 43.3 mmol/L — ABNORMAL HIGH (ref 20.0–28.0)
O2 Content: 60 L/min
O2 Saturation: 96.2 %
O2 Saturation: 96.9 %
PEEP: 10 cmH2O
Patient temperature: 37
Patient temperature: 37
pCO2 arterial: 82 mmHg (ref 32–48)
pCO2 arterial: 90 mmHg (ref 32–48)
pH, Arterial: 7.31 — ABNORMAL LOW (ref 7.35–7.45)
pH, Arterial: 7.29 — ABNORMAL LOW (ref 7.35–7.45)
pO2, Arterial: 72 mmHg — ABNORMAL LOW (ref 83–108)
pO2, Arterial: 82 mmHg — ABNORMAL LOW (ref 83–108)

## 2024-11-25 LAB — CULTURE, BLOOD (ROUTINE X 2)
Culture: NO GROWTH
Culture: NO GROWTH
Special Requests: ADEQUATE
Special Requests: ADEQUATE

## 2024-11-25 LAB — CBC
HCT: 27.6 % — ABNORMAL LOW (ref 39.0–52.0)
Hemoglobin: 8.4 g/dL — ABNORMAL LOW (ref 13.0–17.0)
MCH: 28.7 pg (ref 26.0–34.0)
MCHC: 30.4 g/dL (ref 30.0–36.0)
MCV: 94.2 fL (ref 80.0–100.0)
Platelets: 151 K/uL (ref 150–400)
RBC: 2.93 MIL/uL — ABNORMAL LOW (ref 4.22–5.81)
RDW: 15.5 % (ref 11.5–15.5)
WBC: 14.1 K/uL — ABNORMAL HIGH (ref 4.0–10.5)
nRBC: 0.2 % (ref 0.0–0.2)

## 2024-11-25 LAB — GLUCOSE, CAPILLARY
Glucose-Capillary: 138 mg/dL — ABNORMAL HIGH (ref 70–99)
Glucose-Capillary: 112 mg/dL — ABNORMAL HIGH (ref 70–99)
Glucose-Capillary: 109 mg/dL — ABNORMAL HIGH (ref 70–99)
Glucose-Capillary: 217 mg/dL — ABNORMAL HIGH (ref 70–99)
Glucose-Capillary: 147 mg/dL — ABNORMAL HIGH (ref 70–99)

## 2024-11-25 LAB — MAGNESIUM: Magnesium: 2.1 mg/dL (ref 1.7–2.4)

## 2024-11-25 MED ORDER — ACETAMINOPHEN 650 MG RE SUPP: 650.0000 mg | Freq: Four times a day (QID) | RECTAL | Status: DC | PRN

## 2024-11-25 MED ORDER — HYDROMORPHONE BOLUS VIA INFUSION: 1.0000 mg | INTRAVENOUS | Status: DC | PRN

## 2024-11-25 MED ORDER — ACETAMINOPHEN 325 MG PO TABS: 650.0000 mg | ORAL_TABLET | Freq: Four times a day (QID) | ORAL | Status: DC | PRN

## 2024-11-25 MED ORDER — GLYCOPYRROLATE 1 MG PO TABS: 1.0000 mg | ORAL_TABLET | ORAL | Status: DC | PRN

## 2024-11-25 MED ORDER — GLYCOPYRROLATE 0.2 MG/ML IJ SOLN: 0.2000 mg | INTRAMUSCULAR | Status: DC | PRN

## 2024-11-25 MED ORDER — FREE WATER
100.0000 mL | Status: DC
  Administered 2024-11-25: 20:00:00 100 mL

## 2024-11-25 MED ORDER — FUROSEMIDE 10 MG/ML IJ SOLN
40.0000 mg | Freq: Once | INTRAMUSCULAR | Status: AC
  Administered 2024-11-25: 10:00:00 40 mg via INTRAVENOUS
  Filled 2024-11-25: qty 4

## 2024-11-25 MED ORDER — LORAZEPAM 2 MG/ML IJ SOLN: 2.0000 mg | INTRAMUSCULAR | Status: DC | PRN

## 2024-11-25 MED ORDER — SODIUM CHLORIDE 0.9 % IV SOLN: INTRAVENOUS | Status: DC

## 2024-11-25 MED ORDER — VECURONIUM BROMIDE 10 MG IV SOLR: 10.0000 mg | Freq: Once | INTRAVENOUS | Status: DC

## 2024-11-25 MED ORDER — PHENYLEPHRINE HCL-NACL 20-0.9 MG/250ML-% IV SOLN
25.0000 ug/min | INTRAVENOUS | Status: DC
  Administered 2024-11-25 (×2): 200 ug/min via INTRAVENOUS
  Filled 2024-11-25 (×2): qty 250

## 2024-11-25 MED ORDER — DEXMEDETOMIDINE HCL IN NACL 400 MCG/100ML IV SOLN
0.0000 ug/kg/h | INTRAVENOUS | Status: DC
  Administered 2024-11-25: 22:00:00 1.2 ug/kg/h via INTRAVENOUS
  Filled 2024-11-25: qty 100

## 2024-11-25 MED ADMIN — Vecuronium Bromide For Inj 10 MG: 20 mg | INTRAVENOUS | @ 18:00:00 | NDC 67457043800

## 2024-11-25 MED FILL — Vecuronium Bromide For Inj 10 MG: 20.0000 mg | INTRAVENOUS | Qty: 20 | Status: AC

## 2024-11-25 NOTE — Progress Notes (Signed)
 NAME:  Christian Fields, MRN:  [id number], DOB:  1956-04-24, LOS: 13 ADMISSION DATE:  11/12/2024, CONSULTATION DATE:  11/15/2024 REFERRING MD:  Dr. Awanda, CHIEF COMPLAINT:  Acute Respiratory Distress   Brief Pt Description / Synopsis:  68 y.o. male with PMHx significant for COPD and lung cancer admitted with Acute on Chronic Hypoxic and Hypercapnic Respiratory Failure due to Acute COPD Exacerbation due to Parainfluenza Infection requiring BiPAP.  Failed trial of BiPAP on 11/16/24 requiring intubation and mechanical ventilation.   History of Present Illness:  Christian Fields is a 68 year old male with a history of lifelong smoking and advanced COPD, had a diagnosis of stage I left upper lobe lung cancer after noticing an interval increase in the size of nodule from 1 cm to 1.4 in 2021. He went straight to SBRT due to being a poor surgical candidate for biopsy. He also is having recurrent COPD exacerbations and having rehospitalization for this. He has chronic hypoxemia and uses oxygen  at home up to 5 L/min. He has chronic physical deconditioning. He has chronic peripheral edema and also follows up with cardiology for essential hypertension and dyslipidemia. He also has a background history of CKD 3 A, sleep apnea noncompliant with CPAP therapy, polysubstance abuse and abdominal aortic aneurysm. He came to the hospital on this admission with complaints of worsening shortness of breath x 1 day was noted to have oxygen  saturation approximately 60% on his usual 4 L/min nasal cannula he denies sick contacts or fevers at home. He had a venous blood gas performed in the ED which showed mild acidemia with hypercapnia. He was treated with nebulizer therapy and steroids as well as BiPAP and reports slight improvement since then. He is started on empiric antibiotics with cefepime  and vancomycin  for possible pneumonia. PCCM consultation placed due to recurrent hospitalization with advanced COPD, chronic hypoxemia and lung  cancer.    Please see Significant Hospital Events section below for full detailed hospital course.   Pertinent  Medical History   Past Medical History:  Diagnosis Date   Anemia    Anxiety    Arthritis    Asthma    Cancer (HCC)    Basal Cell Skin Cancer   Chronic back pain    COPD (chronic obstructive pulmonary disease) (HCC)    Depression    Diabetes mellitus (HCC)    Dyspnea    GERD (gastroesophageal reflux disease)    Gout    Gout    Headache    History of blood clots    Left Leg--July 2018   History of kidney stones    Hyperlipidemia    Hyperlipidemia    Hypertension    Kidney stones    Neuropathy    On home oxygen  therapy    2 L / M   Pneumonia 06/2017   Sleep apnea    Ulcer of foot (HCC)    Right    Micro Data:  12/5: COVID/FLU/RSV PCR>> negative 12/5: RVP >> + Parainfluenza  12/5: MRSA PCR>> negative  12/9: Tracheal aspirate>> + enterococcus faecalis 12/9: Strep pneumo urinary antigen>>negative 12/9: Legionella urinary antigen>>negative 12/13: Blood cultures x2>>no growth to date 12/13: Repeat Tracheal aspirate>> few candida albicans  Antimicrobials:   Anti-infectives (From admission, onward)    Start     Dose/Rate Route Frequency Ordered Stop   11/19/24 0800  vancomycin  (VANCOCIN ) IVPB 1000 mg/200 mL premix  Status:  Discontinued        1,000 mg 200 mL/hr over 60 Minutes Intravenous Every  24 hours 11/18/24 1253 11/19/24 1036   11/18/24 1300  vancomycin  (VANCOREADY) IVPB 1250 mg/250 mL        1,250 mg 166.7 mL/hr over 90 Minutes Intravenous  Once 11/18/24 1129 11/18/24 1333   11/16/24 1200  piperacillin -tazobactam (ZOSYN ) IVPB 3.375 g  Status:  Discontinued        3.375 g 12.5 mL/hr over 240 Minutes Intravenous Every 8 hours 11/16/24 1110 11/23/24 1058   11/15/24 1215  azithromycin  (ZITHROMAX ) 500 mg in sodium chloride  0.9 % 250 mL IVPB  Status:  Discontinued        500 mg 250 mL/hr over 60 Minutes Intravenous Every 24 hours 11/15/24 1129  11/18/24 1016   11/15/24 1215  cefTRIAXone  (ROCEPHIN ) 2 g in sodium chloride  0.9 % 100 mL IVPB  Status:  Discontinued        2 g 200 mL/hr over 30 Minutes Intravenous Every 24 hours 11/15/24 1129 11/16/24 1030   11/13/24 1900  vancomycin  (VANCOREADY) IVPB 1250 mg/250 mL  Status:  Discontinued        1,250 mg 166.7 mL/hr over 90 Minutes Intravenous Every 24 hours 11/12/24 1744 11/12/24 1749   11/13/24 1000  azithromycin  (ZITHROMAX ) tablet 250 mg  Status:  Discontinued        250 mg Oral Daily 11/13/24 0819 11/15/24 1129   11/13/24 0500  piperacillin -tazobactam (ZOSYN ) IVPB 3.375 g  Status:  Discontinued        3.375 g 12.5 mL/hr over 240 Minutes Intravenous Every 8 hours 11/12/24 2112 11/13/24 0814   11/12/24 1900  vancomycin  (VANCOREADY) IVPB 1750 mg/350 mL  Status:  Discontinued        1,750 mg 175 mL/hr over 120 Minutes Intravenous  Once 11/12/24 1744 11/12/24 1749   11/12/24 1700  ceFEPIme  (MAXIPIME ) 2 g in sodium chloride  0.9 % 100 mL IVPB  Status:  Discontinued        2 g 200 mL/hr over 30 Minutes Intravenous Every 12 hours 11/12/24 1615 11/12/24 1745       Significant Hospital Events: Including procedures, antibiotic start and stop dates in addition to other pertinent events   12/5: Admitted by [facility].  Pulmonary consulted for severe AECOPD requiring BiPAP. 11/14/24-patient with Parainfluenza induced AECOPD. He was moved to Step down unit due to progressive dyspnea and is on BIPAP. He is on solumedrol BID and nebulizer therapy. Will continue to follow. Anticipate slow improvement over next 3-5 weeks with this virus. Hopefully dc in 3-4days.  11/15/24: Mid morning pt with increased WOB and accessory muscle use.  PCCM consulted.  Given 40 mg Lasix  by [name].  Increase duonebs to q4h and start Ceftriaxone  and increase Azithromycin  dose to 500 mg.  High risk for intubation.  11/16/24: Acutely decompensated, became severely hypoxic (sats 60's) with increased WOB, required intubation and mechanical  ventilation. Given acute decompensation and persistent sinus tachycardia, CTa obtained which is negative for PE, does show possible superimposed bacterial pneumonia.  Ceftriaxone  broadened to Zosyn . Telemetry alarming for ST elevation, obtained EKG and reviewed with Dr. Mady of Cardiology, felt not to be STEMI, obtaining Troponin and Echo. 11/17/24: No acute events overnight. Cr worsening at 1.41 this am. Remains on Zosyn . Awake and alert this am, transitioned to SBT but failed quickly. Became tachycardic and hypertensive. Restarted sedation.  11/18/24: No acute overnight events. Remains critically ill requiring mechanical ventilation. Will perform another SBT today with precedex .  11/19/24 11/20/24: 11/21/24: 11/22/24: Remains critically ill requiring mechanical ventilation. On dilaudid  and propofol  gtt, 3mcg of  levo. Worsening hypercapnia overnight requiring vecuronium  administration and versed .. Repeat ABG pending. 11/23/24:  on PCV, requiring heavy sedation. Diuresed with 40 mg Lasix  later in the day, Creatinine improved with diuresis and UOP 2.6 L (net +5.7L) 11/24/24: No acute events overnight.  Afebrile, requiring 2 mcg Levophed .  Remains on vent, currently 50% FiO2, no SBT today as pt with increased WOB on full support and sedation.  Will diurese with 40 mg Lasix  x1 dose.  11/14/2024: No acute events overnight.  Afebrile, off Levophed .  Remains on vent (50% FiO2), currently with increased WOB and assessory muscle use while on full vent support and sedation, will not perform SBT.  Good response to diuresis yesterday, Creatinine stable, UOP 2.4 L (net +6L), will give 40 mg IV Lasix  x1 dose today.  Interim History / Subjective:  As outlined above under Significant Hospital Events section   Objective   Blood pressure (!) 89/55, pulse 84, temperature 99.2 F (37.3 C), temperature source Oral, resp. rate 15, height 5' 6 (1.676 m), weight 78.4 kg, SpO2 96%.    Vent Mode: PCV FiO2 (%):  [50 %] 50  % Set Rate:  [20 bmp] 20 bmp PEEP:  [8 cmH20] 8 cmH20 Plateau Pressure:  [17 cmH20-22 cmH20] 22 cmH20   Intake/Output Summary (Last 24 hours) at 11/24/2024 0741 Last data filed at 11/15/2024 0700 Gross per 24 hour  Intake 3691.47 ml  Output 3450 ml  Net 241.47 ml   Filed Weights   11/23/24 0500 11/24/24 0500 11/24/2024 0500  Weight: 75.2 kg 78.5 kg 78.4 kg    Examination: General: critically ill appearing male, intubated and sedated, with increase WOB HEENT: MM pink/moist; ETT in place Neuro: sedated; opened eyes to gentle stimulation, intermittently following commands, PERRL CV: s1s2, RRR, no m/r/g PULM:  Coarse breath sounds throughout, overbreathing the vent, with increased WOB and accessory muscle use  GI: soft, bsx4 active  Extremities: warm/dry, no edema  Skin: no rashes or lesions    Resolved Hospital Problem list      Assessment & Plan:   #Sedation needs in setting of mechanical ventilation Hx of Chronic pain syndrome, Depression. Anxiety, sleep apnea -Maintain a RASS goal of 0 to -1 -Dilaudid  and Propofol  to maintain RASS goal -Avoid sedating medications as able -Daily wake up assessment ~ utilize Precedex  for WUA -Continue home gabapentin  at half dose and trazodone ; continue seroquel   #Acute on chronic Hypoxic & Hypercapnic Respiratory Failure #Acute COPD Exacerbation d/t Parainfluenza Viral Infection and Aspiration PNA #Superimposed Bacterial Pneumonia (+ Enterococcus Faecalis) #Lung Cancer Hx: COPD on home oxygen , Asthma  CTa Chest PE 12/9: negative for PE, new multifocal airspace consolidation in RLL, RML, and LLL, stable 2.6 cm pleural based mass like consolidation in lateral LUL, enlarged central pulmonary arteries suggesting pulmonary artery hypertension  -Full vent support, implement lung protective strategies -Plateau pressures less than 30 cm H20 -Wean FiO2 & PEEP as tolerated to maintain O2 sats >92% -Follow intermittent Chest X-ray & ABG as  needed -Spontaneous Breathing Trials when respiratory parameters met and mental status permits -Implement VAP Bundle -Bronchodilators: Triple therapy maintenance nebs; continue home daliresp ; prn duoneb -Continue Prednisone  taper -Completed ABX as above -Diuresis as BP and renal function permits ~ give 40 mg IV Lasix  on 12/18  #Tachycardia, likely compensatory in nature d/t respiratory failure #Mildly Elevated Troponin, suspect demand ischemia  Hx: HTN, HLD 12/9: Telemetry alarming ST elevation, EKG with ST elevation in inferior leads (but no reciprocal changes), reviewed with Dr. Mady, felt not  to be a STEMI 11/17/24 ECHO: LVEF 60-65%, mild LVH, severely elevated pulmonary artery pressure with mildly reduced RV systolic function; moderate thickening of aortic valve.  Plan: -tele monitoring -cont home plavix , statin and ezetimibe   #Mild AKI on CKD 3a -Monitor I&O's / urinary output -Follow BMP -Ensure adequate renal perfusion -Avoid nephrotoxic agents as able -Replace electrolytes as indicated ~ Pharmacy following for assistance with electrolyte replacement  #GERD -PPI  #Type II Diabetes Mellitus -CBG's q4h; Target range of 140 to 180 -SSI -Follow ICU Hypo/Hyperglycemia protocol  #Parainfluenza Viral Infection #Superimposed bacterial pneumonia: Enterococcus Faecalis ~ TREATED  -Monitor fever curve -Trend WBC's  -Follow cultures as above -Completed course of antibiotics as above    Best Practice (right click and Reselect all SmartList Selections daily)   Diet/type: tube feeds DVT prophylaxis: LMWH GI prophylaxis: PPI Lines: right radial arterial line Foley:  yes, and still needed  Code Status:  DNR Last date of multidisciplinary goals of care discussion [12/18]  12/18: Will update pt's family when they arrive at bedside.   Labs   CBC: Recent Labs  Lab 11/21/24 0315 11/22/24 0332 11/23/24 0352 11/24/24 0336 11/08/2024 0339  WBC 27.3* 22.0* 22.3* 15.3* 14.1*   HGB 11.2* 10.3* 9.1* 9.1* 8.4*  HCT 39.8 36.1* 31.6* 30.4* 27.6*  MCV 100.5* 100.6* 99.1 96.5 94.2  PLT 187 147* 148* 150 151    Basic Metabolic Panel: Recent Labs  Lab 11/19/24 0336 11/20/24 0331 11/21/24 0315 11/22/24 0332 11/23/24 0352 11/24/24 0336 11/24/2024 0339  NA 140   < > 144 146* 143 141 139  K 4.5   < > 4.2 4.1 3.6 3.3* 4.6  CL 100   < > 100 103 100 97* 97*  CO2 32   < > 33* 34* 34* 35* 34*  GLUCOSE 201*   < > 167* 195* 189* 276* 133*  BUN 59*   < > 60* 69* 82* 86* 74*  CREATININE 1.28*   < > 1.27* 1.42* 1.26* 1.12 1.05  CALCIUM  8.8*   < > 9.1 9.5 9.4 9.3 8.9  MG 2.2   < > 2.3 2.4 2.3 2.1 2.1  PHOS 3.0  --   --   --   --  3.1 3.4   < > = values in this interval not displayed.   GFR: Estimated Creatinine Clearance: 65.4 mL/min (by C-G formula based on SCr of 1.05 mg/dL). Recent Labs  Lab 11/22/24 0332 11/23/24 0352 11/24/24 0336 12/05/2024 0339  WBC 22.0* 22.3* 15.3* 14.1*    Liver Function Tests: Recent Labs  Lab 11/22/2024 0339  ALBUMIN  2.9*    No results for input(s): LIPASE, AMYLASE in the last 168 hours. No results for input(s): AMMONIA in the last 168 hours.  ABG    Component Value Date/Time   PHART 7.39 11/23/2024 0501   PCO2ART 60 (H) 11/23/2024 0501   PO2ART 65 (L) 11/23/2024 0501   HCO3 36.3 (H) 11/23/2024 0501   ACIDBASEDEF 1.4 11/03/2024 1854   O2SAT 96.2 11/23/2024 0501     Coagulation Profile: No results for input(s): INR, PROTIME in the last 168 hours.   Cardiac Enzymes: No results for input(s): CKTOTAL, CKMB, CKMBINDEX, TROPONINI in the last 168 hours.  HbA1C: Hemoglobin A1C  Date/Time Value Ref Range Status  09/25/2013 03:56 AM 6.3 4.2 - 6.3 % Final    Comment:    The American Diabetes Association recommends that a primary goal of therapy should be <7% and that physicians should reevaluate the treatment regimen in  patients with HbA1c values consistently >8%.    Hgb A1c MFr Bld  Date/Time Value Ref  Range Status  07/16/2024 03:19 AM 6.0 (H) 4.8 - 5.6 % Final    Comment:    (NOTE)         Prediabetes: 5.7 - 6.4         Diabetes: >6.4         Glycemic control for adults with diabetes: <7.0   10/17/2023 04:51 AM 5.7 (H) 4.8 - 5.6 % Final    Comment:    (NOTE) Pre diabetes:          5.7%-6.4%  Diabetes:              >6.4%  Glycemic control for   <7.0% adults with diabetes     CBG: Recent Labs  Lab 11/24/24 1549 11/24/24 1934 11/24/24 2350 11/09/2024 0340 12/07/2024 0720  GLUCAP 212* 214* 134* 138* 112*    Review of Systems:   Unable to assess due to intubation/sedation/critical illness   Past Medical History:  He,  has a past medical history of Anemia, Anxiety, Arthritis, Asthma, Cancer (HCC), Chronic back pain, COPD (chronic obstructive pulmonary disease) (HCC), Depression, Diabetes mellitus (HCC), Dyspnea, GERD (gastroesophageal reflux disease), Gout, Gout, Headache, History of blood clots, History of kidney stones, Hyperlipidemia, Hyperlipidemia, Hypertension, Kidney stones, Neuropathy, On home oxygen  therapy, Pneumonia (06/2017), Sleep apnea, and Ulcer of foot (HCC).   Surgical History:   Past Surgical History:  Procedure Laterality Date   APPENDECTOMY     DG FEET 2 VIEWS BILAT     IRRIGATION AND DEBRIDEMENT FOOT Left 07/11/2023   Procedure: IRRIGATION AND DEBRIDEMENT FOOT;  Surgeon: Ashley Soulier, DPM;  Location: [facility] ORS;  Service: Orthopedics/Podiatry;  Laterality: Left;   LIPOMA EXCISION Right 08/15/2017   Procedure: EXCISION TUMOR(CYST) FOOT;  Surgeon: Lilli Cough, DPM;  Location: [facility] ORS;  Service: Podiatry;  Laterality: Right;   LOWER EXTREMITY ANGIOGRAPHY Left 07/03/2023   Procedure: Lower Extremity Angiography;  Surgeon: Marea Selinda RAMAN, MD;  Location: [facility] INVASIVE CV LAB;  Service: Cardiovascular;  Laterality: Left;   OTHER SURGICAL HISTORY Bilateral Foot surgery     Social History:   reports that he has quit smoking. His smoking use included  cigarettes. He started smoking about 7 years ago. He has a 4 pack-year smoking history. He has never used smokeless tobacco. He reports current alcohol  use of about 1.0 standard drink of alcohol  per week. He reports that he does not use drugs.   Family History:  His family history includes Hypertension in his mother.   Allergies Allergies  Allergen Reactions   Bee Venom Anaphylaxis   Linezolid  Other (See Comments)    Thrombocytopenia, hyperlactatemia   Azithromycin  Itching and Rash     Home Medications  Prior to Admission medications   Medication Sig Start Date End Date Taking? Authorizing Provider  acetaminophen  (TYLENOL ) 325 MG tablet Take 650 mg by mouth every 6 (six) hours as needed.   Yes [provider]  albuterol  (VENTOLIN  HFA) 108 (90 Base) MCG/ACT inhaler Inhale 2 puffs into the lungs every 4 (four) hours as needed for wheezing or shortness of breath.   Yes [provider]  allopurinol  (ZYLOPRIM ) 300 MG tablet Take 600 mg by mouth daily. 02/24/20  Yes [provider]  aspirin  EC 81 MG tablet Take 1 tablet by mouth daily.   Yes [provider]  azelastine  (ASTELIN ) 0.1 % nasal spray Place 2 sprays into both nostrils 2 (two) times  daily as needed. 05/27/23  Yes [provider]  Buprenorphine  HCl-Naloxone  HCl 8-2 MG FILM Place 1 Film under the tongue in the morning, at noon, and at bedtime.   Yes [provider]  clopidogrel  (PLAVIX ) 75 MG tablet Take 1 tablet (75 mg total) by mouth daily. 02/02/21  Yes Leotis Bogus, MD  dapagliflozin  propanediol (FARXIGA ) 10 MG TABS tablet Take 1 tablet by mouth daily. 03/16/24 03/16/25 Yes [provider]  DULoxetine  (CYMBALTA ) 60 MG capsule Take 60 mg by mouth daily.   Yes [provider]  ezetimibe  (ZETIA ) 10 MG tablet Take 10 mg by mouth daily. 02/24/20  Yes [provider]  fluticasone  (FLONASE ) 50 MCG/ACT nasal spray Place 1 spray into both nostrils daily as needed for  allergies. 10/16/18  Yes [provider]  Fluticasone -Umeclidin-Vilant (TRELEGY ELLIPTA) 100-62.5-25 MCG/ACT AEPB Inhale 1 puff into the lungs in the morning. 09/12/21  Yes [provider]  furosemide  (LASIX ) 20 MG tablet Take 1 tablet (20 mg total) by mouth daily. 02/23/24  Yes Jens Durand, MD  gabapentin  (NEURONTIN ) 800 MG tablet Take 800 mg by mouth 4 (four) times daily. 10/22/23  Yes [provider]  ipratropium-albuterol  (DUONEB) 0.5-2.5 (3) MG/3ML SOLN Take 3 mLs by nebulization every 6 (six) hours as needed (wheezing).   Yes [provider]  metFORMIN  (GLUCOPHAGE ) 1000 MG tablet Take 1,000 mg by mouth daily.   Yes [provider]  metoprolol  succinate (TOPROL -XL) 50 MG 24 hr tablet Take 50 mg by mouth daily. 02/24/20  Yes [provider]  Multiple Vitamins-Minerals (MULTIVITAMIN ADULT, MINERALS, PO) Take 1 tablet by mouth every morning.   Yes [provider]  naloxone  (NARCAN ) nasal spray 4 mg/0.1 mL Place 1 spray into the nose once.   Yes [provider]  OHTUVAYRE  3 MG/2.5ML SUSP Take 5 mLs by mouth daily. 07/05/24  Yes [provider]  ondansetron  (ZOFRAN -ODT) 4 MG disintegrating tablet Take 4 mg by mouth every 8 (eight) hours as needed for nausea.   Yes [provider]  OXYGEN  Inhale 4 L into the lungs.   Yes [provider]  predniSONE  (DELTASONE ) 20 MG tablet 60mg  daily x 2 days, 40mg  daily x 2 days, 20mg  daily x 2 days then stop. 11/07/24  Yes Trudy Anthony HERO, MD  roflumilast  (DALIRESP ) 500 MCG TABS tablet Take 500 mcg by mouth daily. 08/15/21  Yes [provider]  simvastatin  (ZOCOR ) 40 MG tablet Take 40 mg by mouth at bedtime. 01/17/16  Yes [provider]  Syringe/Needle, Disp, (SYRINGE 3CC/25GX1) 25G X 1 3 ML MISC 1 Syringe by Does not apply route every 30 (thirty) days. 07/12/22  Yes Brahmanday, Govinda R, MD  traZODone  (DESYREL ) 50 MG tablet Take 50 mg by mouth at  bedtime as needed for sleep. 01/22/24 01/21/25 Yes [provider]  bisacodyl  (DULCOLAX) 5 MG EC tablet Take 1 tablet (5 mg total) by mouth daily as needed for moderate constipation. Patient not taking: Reported on 11/03/2024 09/04/23   Dorinda Drue DASEN, MD     Critical care time: 40 minutes    Inge Lecher, AGACNP-BC [facility] Prefer epic messenger for cross cover needs If after hours, please call E-link

## 2024-11-25 NOTE — IPAL (Signed)
°  Interdisciplinary Goals of Care Family Meeting   Date carried out: 11/22/2024  Location of the meeting: Bedside  Member's involved: Nurse Practitioner, Bedside Registered Nurse, and Family Member or next of kin  Durable Power of Attorney or acting medical decision maker:   Daughter & Son along with the patient's brothers, mother and other family  Discussion: We discussed goals of care for Christian Fields. Patient was not improving despite maximum support and the decision had been made for the patient to be terminally extubated on 2024-12-07. However, overnight he became severely hypoxic and hypotensive. Family arrived bedside and made the decision to transition to comfort measures. All questions and concerns answered at this time.  Code status:   Code Status: Do not attempt resuscitation (DNR) - Comfort care   Disposition: In-patient comfort care  Time spent for the meeting: 20 minutes    Jenita Jama Meek, AGACNP-BC Acute Care Nurse Practitioner [facility]   [phone] / [phone] Please see Amion for details.

## 2024-11-25 NOTE — Progress Notes (Signed)
 1910- On arrival to shift, patient started to decline with hypoxia and hypotension. Pressors increased, Fi02 already at 100%. Respiratory & NP at bedside. Family notified and encouraged to come to bedside. Additional pressor added and bagged.   2030- Family now at bedside and wants to proceed with comfort care measures once mother arrives.

## 2024-11-25 NOTE — Progress Notes (Signed)
 Pt extubated per NP order. Pt extubated to 4L [place]. RN and family at bedside.

## 2024-11-25 NOTE — Progress Notes (Signed)
 Advanced tube from 19 to 24

## 2024-11-25 NOTE — Progress Notes (Signed)
 PHARMACY CONSULT NOTE  Pharmacy Consult for Electrolyte Monitoring and Replacement   Recent Labs: Potassium (mmol/L)  Date Value  11/29/2024 4.6  05/17/2014 3.5   Magnesium  (mg/dL)  Date Value  87/81/7974 2.1   Calcium  (mg/dL)  Date Value  87/81/7974 8.9   Calcium , Total (mg/dL)  Date Value  95/76/7984 9.3   Albumin  (g/dL)  Date Value  87/81/7974 2.9 (L)  09/24/2013 3.6   Phosphorus (mg/dL)  Date Value  87/81/7974 3.4   Sodium (mmol/L)  Date Value  11/18/2024 139  03/31/2014 136    Assessment:  68 y.o. male admitted on 11/12/2024 with sepsis and acute hypoxic on chronic hypercarbic respiratory failure. PMH significant for COPD on home oxygen  4 L, non-small cell lung cancer s/p RXT, OSA on CPAP, GERD, history of TIA, asthma, diabetes type 2, HLD, HTN, and CKD-3a. Patient was also recently admitted from 11/26-11/30 for COPD exacerbation. Pharmacy has been consulted for replacing electrolytes  Nutrition: Vital AF at 30 mL/hr + FWF 200 mL every 4h  Diuretics: furosemide  40 mg IV x 1  Goal of Therapy:  Electrolytes WNL  Plan:  --reduce free water  to 100 mL every 4 hours --Recheck electrolytes in am  Christian Fields 11/16/2024 7:25 AM

## 2024-11-25 NOTE — Progress Notes (Signed)
 Daily Progress Note   Patient Name: Christian Fields       Date: 11/27/2024 DOB: 09-Oct-1956  Age: 68 y.o. MRN#: [id number] Attending Physician: Isaiah Scrivener, MD Primary Care Physician: Lora Odor, FNP Admit Date: 11/12/2024  Reason for Consultation/Follow-up: Establishing goals of care  Subjective: Notes and labs reviewed.  Patient remains on ventilator support.  Spoke with CCM in depth regarding case and planning.  Plans in place per CCM to proceed with one-way extubation tomorrow.  PMT will be available for needs.  Length of Stay: 13  Current Medications: Scheduled Meds:   arformoterol   15 mcg Nebulization Q12H   aspirin   81 mg Per Tube Daily   budesonide  (PULMICORT ) nebulizer solution  0.5 mg Nebulization BID   Chlorhexidine  Gluconate Cloth  6 each Topical Q0600   clopidogrel   75 mg Per Tube Daily   enoxaparin  (LOVENOX ) injection  40 mg Subcutaneous Q24H   ezetimibe   10 mg Per Tube Daily   feeding supplement (PROSource TF20)  60 mL Per Tube Daily   fiber  1 packet Per Tube TID   free water   100 mL Per Tube Q4H   gabapentin   400 mg Per Tube QID   insulin  aspart  0-20 Units Subcutaneous Q4H   insulin  aspart  6 Units Subcutaneous Q4H   insulin  glargine  8 Units Subcutaneous BID   nutrition supplement (JUVEN)  1 packet Per Tube BID BM   mouth rinse  15 mL Mouth Rinse Q2H   pantoprazole  (PROTONIX ) IV  40 mg Intravenous QHS   [START ON 08-Dec-2024] predniSONE   20 mg Per Tube Q breakfast   Followed by   NOREEN ON 11/27/2024] predniSONE   10 mg Per Tube Q breakfast   QUEtiapine   25 mg Per Tube BID   revefenacin   175 mcg Nebulization Daily   roflumilast   500 mcg Per Tube Daily   simvastatin   40 mg Per Tube QHS   traZODone   50 mg Per Tube QHS    Continuous Infusions:   dexmedetomidine  (PRECEDEX ) IV infusion 0.6 mcg/kg/hr (12/08/2024 1547)   feeding supplement (VITAL 1.5 CAL) 60 mL/hr at 11/19/2024 1428   HYDROmorphone  1 mg/hr (11/15/2024 1428)   norepinephrine  (LEVOPHED ) Adult infusion 2 mcg/min (11/20/2024 1428)   propofol  (DIPRIVAN ) infusion 30 mcg/kg/min (12/07/2024 1428)    PRN Meds: acetaminophen  **OR** acetaminophen ,  artificial tears, HYDROmorphone , ipratropium-albuterol , liver oil-zinc  oxide, midazolam  PF, ondansetron  **OR** ondansetron  (ZOFRAN ) IV, mouth rinse, polyethylene glycol  Physical Exam Constitutional:      Comments: Eyes closed  Pulmonary:     Comments: On ventilator support            Vital Signs: BP 103/64   Pulse (!) 113   Temp 99.1 F (37.3 C) (Axillary)   Resp 19   Ht 5' 6 (1.676 m)   Wt 78.4 kg   SpO2 92%   BMI 27.90 kg/m  SpO2: SpO2: 92 % O2 Device: O2 Device: Ventilator O2 Flow Rate: O2 Flow Rate (L/min): 6 L/min  Intake/output summary:  Intake/Output Summary (Last 24 hours) at 12/03/2024 1614 Last data filed at 11/22/2024 1428 Gross per 24 hour  Intake 3128.58 ml  Output 2675 ml  Net 453.58 ml   LBM: Last BM Date : 11/19/2024 Baseline Weight: Weight: 77 kg Most recent weight: Weight: 78.4 kg   Patient Active Problem List   Diagnosis Date Noted   Acute hypoxic on chronic hypercapnic respiratory failure (HCC) 11/12/2024   Acute on chronic combined systolic and diastolic CHF (congestive heart failure) (HCC) 11/03/2024   Acute on chronic combined systolic and diastolic congestive heart failure (HCC) 11/03/2024   CKD stage 3a, GFR 45-59 ml/min (HCC) 07/15/2024   Acute CHF (congestive heart failure) (HCC) 02/20/2024   Acute on chronic diastolic CHF (congestive heart failure) (HCC) 02/19/2024   Chronic respiratory failure with hypoxia (HCC) 02/19/2024   COPD exacerbation (HCC) 02/06/2024   Type II diabetes mellitus with renal manifestations (HCC) 02/06/2024   CKD (chronic kidney disease) stage 2, GFR 60-89 ml/min  02/06/2024   Anxiety 10/20/2023   Sinus tachycardia 10/20/2023   Acute respiratory failure with hypoxia and hypercapnia (HCC) 10/19/2023   Hypomagnesemia 10/18/2023   Tremor of both hands 10/18/2023   Acute hypoxic respiratory failure (HCC) 10/06/2023   Respiratory distress 10/04/2023   Anemia 10/04/2023   Erythematous rash 08/01/2023   Thrombocytopenia 08/01/2023   Zinc  deficiency 08/01/2023   Decreased oral intake 07/27/2023   Elevated serum creatinine 07/27/2023   Acute osteomyelitis of left calcaneus (HCC) 07/11/2023   Diabetic infection of left foot (HCC) 07/11/2023   History of osteomyelitis 07/10/2023   Cellulitis and abscess of toe of left foot 07/03/2023   PVD (peripheral vascular disease) 07/03/2023   Diabetic foot ulcer (HCC) 07/02/2023   HLD (hyperlipidemia) 07/02/2023   Depression with anxiety 07/02/2023   Elevated lactic acid level 07/02/2023   Abnormal LFTs 07/02/2023   COPD (chronic obstructive pulmonary disease) (HCC) 07/02/2023   Diarrhea 07/02/2023   Increased band cell count 04/18/2023   History of ankle fusion (Right) 12/31/2021   Chronic anticoagulation (Plavix ) 12/31/2021   Long term prescription benzodiazepine use (Klonopin ) 12/31/2021   DDD (degenerative disc disease), cervical 12/31/2021   Pharmacologic therapy 10/31/2021   Disorder of skeletal system 10/31/2021   Problems influencing health status 10/31/2021   Aspiration pneumonia (HCC)    Sepsis (HCC) 08/05/2021   Gout 08/05/2021   Depression 08/05/2021   Chronic heart failure with preserved ejection fraction (HFpEF) (HCC) 08/05/2021   HCAP (healthcare-associated pneumonia) 08/05/2021   Pain of left lower extremity 03/25/2021   Acute on chronic respiratory failure with hypoxia (HCC) 03/08/2021   Septic shock (HCC) 02/26/2021   Polysubstance (excluding opioids) dependence (HCC) 01/30/2021   Acute metabolic encephalopathy 01/29/2021   COPD with acute exacerbation (HCC) 01/29/2021   Iron   deficiency anemia 11/27/2020   Severe sepsis (HCC)  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   Left leg swelling 04/28/2020   Confusion    Acute respiratory failure with hypoxia (HCC) 03/26/2020   AKI (acute kidney injury) 01/13/2020   Cellulitis of left leg 01/12/2020   COVID-19 virus detected 01/12/2020   Diabetic ulcer of left midfoot associated with type 2 diabetes mellitus, with fat layer exposed (HCC) 01/12/2020   Panlobular emphysema (HCC) 01/12/2020   Arthritis 10/29/2019   Hemorrhoids 10/29/2019   Senile nuclear sclerosis, bilateral 02/15/2019   Cellulitis of left upper limb 02/11/2019   AAA (abdominal aortic aneurysm) without rupture 01/15/2019   Localized swelling, mass and lump, upper limb 12/24/2018   Pain in femur 12/24/2018   TIA (transient ischemic attack) 11/18/2018   Cortical age-related cataract of both eyes 11/02/2018   Fracture of multiple ribs 04/20/2018   Overweight (BMI 25.0-29.9) 04/20/2018   Hypertension 07/08/2017   Insulin  dependent type 2 diabetes mellitus (HCC) 07/08/2017   Tobacco use disorder 07/08/2017   Atherosclerosis of native arteries of extremity with intermittent claudication 07/08/2017   SOB (shortness of breath) 06/19/2017   Acute on chronic respiratory failure with hypoxia and hypercapnia (HCC) 12/15/2016   CAP (community acquired pneumonia) 12/15/2016   Chronic pain syndrome 12/15/2016   Chronically on opiate therapy 12/15/2016   History of kidney stones 12/15/2016   Polypharmacy 12/15/2016   Somnolence 12/15/2016   Foot ulcer (HCC) 01/19/2016   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) 11/29/2015   Other psychoactive substance use, unspecified with withdrawal, uncomplicated (HCC) 11/29/2015   Type 2 diabetes mellitus (HCC) 11/27/2015   Chronic obstructive pulmonary disease, unspecified (HCC) 01/18/2013   Depressive disorder 01/18/2013   Hereditary and idiopathic peripheral neuropathy 01/18/2013   Sleep apnea 01/18/2013   Low  back pain 11/23/2010   Acute gouty arthropathy 07/10/2010   Problem related to lifestyle 10/29/2004    Palliative Care Assessment & Plan   Recommendations/Plan: Plans in place for one-way extubation tomorrow.  PMT will be available for needs.  Code Status:    Code Status Orders  (From admission, onward)           Start     Ordered   11/23/24 1201  Do not attempt resuscitation (DNR) Pre-Arrest Interventions Desired  (Code Status)  Continuous       Question Answer Comment  If pulseless and not breathing No CPR or chest compressions.   In Pre-Arrest Conditions (Patient Has Pulse and Is Breathing) May intubate, use advanced airway interventions and cardioversion/ACLS medications if appropriate or indicated. May transfer to ICU.   Consent: Discussion documented in EHR or advanced directives reviewed      11/23/24 1203           Code Status History     Date Active Date Inactive Code Status Order ID Comments User Context   11/12/2024 1605 11/23/2024 1203 Full Code [id number]  Roann Gouty, MD ED   11/03/2024 2040 11/07/2024 1957 Full Code [id number]  Hilma Rankins, MD ED   07/15/2024 1514 07/17/2024 1907 Full Code [id number]  Cox, Amy N, DO ED   02/19/2024 2155 02/23/2024 1739 Full Code [id number]  Cleatus Delayne GAILS, MD ED   02/06/2024 2010 02/09/2024 1956 Full Code [id number]  Hilma Rankins, MD ED   10/16/2023 1419 10/22/2023 2300 Full Code [id number]  Arnett Saunders, MD ED   10/04/2023 2015 10/09/2023 1921 Full Code [id number]  Tobie Mario GAILS, MD ED   08/30/2023 1344 09/04/2023 2218 Full Code [id number]  Laurita Cort DASEN, MD ED   07/10/2023  1836 07/14/2023 2240 Full Code [id number]  Dorinda Drue DASEN, MD Inpatient   07/10/2023 1828 07/10/2023 1836 Full Code [id number]  Dorinda Drue DASEN, MD Inpatient   07/02/2023 1428 07/04/2023 1548 Full Code [id number]  Hilma Rankins, MD ED   08/05/2021 1855 08/07/2021 1849 Full Code [id number]  Hilma Rankins, MD ED   08/02/2021 2141 08/04/2021 2137 Full Code [id number]  Sim Emery CROME, MD  Inpatient   02/26/2021 2157 03/09/2021 1758 Full Code [id number]  Rust-Chester, Jenita CROME, NP ED   01/29/2021 2351 02/01/2021 1722 Full Code [id number]  Cleatus Delayne GAILS, MD ED   11/12/2020 2253 11/18/2020 2110 Full Code [id number]  Harlow Ozell BRAVO, MD ED   03/26/2020 0136 03/28/2020 2308 Full Code [id number]  Prentiss Dorothyann Maxwell, DO ED   01/19/2016 2021 01/20/2016 1818 Full Code [id number]  Yisroel Sleight, MD Inpatient   Camelia Lewis, NP  Please contact Palliative Medicine Team phone at [phone] for questions and concerns.

## 2024-11-26 DIAGNOSIS — R7989 Other specified abnormal findings of blood chemistry: Secondary | ICD-10-CM | POA: Insufficient documentation

## 2024-11-26 DIAGNOSIS — B348 Other viral infections of unspecified site: Secondary | ICD-10-CM | POA: Insufficient documentation

## 2024-12-09 NOTE — Death Summary Note (Signed)
 " DEATH SUMMARY   Patient Details  Name: Christian Fields MRN: [id number] DOB: 08-06-1956  Admission/Discharge Information   Admit Date:  11/14/2024  Date of Death: Date of Death: 11-27-2024  Time of Death: Time of Death: 2358/11/30  Length of Stay: 11/23/2024  Referring Physician: Lora Odor, FNP   Reason(s) for Hospitalization  Acute on chronic hypoxic respiratory failure  Diagnoses  Preliminary cause of death: Parainfluenzal pneumonia Secondary Diagnoses (including complications and co-morbidities):  Principal Problem:   Acute hypoxic on chronic hypercapnic respiratory failure (HCC) Active Problems:   Hypertension   Insulin  dependent type 2 diabetes mellitus (HCC)   Sleep apnea   NSCLC of left lung (HCC)   AKI (acute kidney injury)   COPD with acute exacerbation (HCC)   Chronic heart failure with preserved ejection fraction (HFpEF) (HCC)   Aspiration pneumonia (HCC)   Bacterial pneumonia   COPD exacerbation (HCC)   Parainfluenza infection   Elevated troponin   Brief Hospital Course (including significant findings, care, treatment, and services provided and events leading to death)  Christian Fields is a 69 y.o. year old male with a history of lifelong smoking and advanced COPD, had a diagnosis of stage I left upper lobe lung cancer after noticing an interval increase in the size of nodule from 1 cm to 1.4 in 2020-11-30. He went straight to SBRT due to being a poor surgical candidate for biopsy. He also is having recurrent COPD exacerbations and having rehospitalization for this. He has chronic hypoxemia and uses oxygen  at home up to 5 L/min. He has chronic physical deconditioning. He has chronic peripheral edema and also follows up with cardiology for essential hypertension and dyslipidemia. He also has a background history of CKD 3 A, sleep apnea noncompliant with CPAP therapy, polysubstance abuse and abdominal aortic aneurysm. He came to the hospital on this admission with complaints of  worsening shortness of breath x 1 day was noted to have oxygen  saturation approximately 60% on his usual 4 L/min nasal cannula he denies sick contacts or fevers at home. He had a venous blood gas performed in the ED which showed mild acidemia with hypercapnia. He was treated with nebulizer therapy and steroids as well as BiPAP and reports slight improvement since then. He is started on empiric antibiotics with cefepime  and vancomycin  for possible pneumonia. PCCM consultation placed due to recurrent hospitalization with advanced COPD, chronic hypoxemia and lung cancer.  Significant Hospital Events: Including procedures, antibiotic start and stop dates in addition to other pertinent events   November 14, 2024: Admitted by [facility].  Pulmonary consulted for severe AECOPD requiring BiPAP. 11/14/24-patient with Parainfluenza induced AECOPD. He was moved to Step down unit due to progressive dyspnea and is on BIPAP. He is on solumedrol BID and nebulizer therapy. Will continue to follow. Anticipate slow improvement over next 3-5 weeks with this virus. Hopefully dc in 3-4days.  11/15/24: Mid morning pt with increased WOB and accessory muscle use.  PCCM consulted.  Given 40 mg Lasix  by [name].  Increase duonebs to q4h and start Ceftriaxone  and increase Azithromycin  dose to 500 mg.  High risk for intubation.  11/16/24: Acutely decompensated, became severely hypoxic (sats 60's) with increased WOB, required intubation and mechanical ventilation. Given acute decompensation and persistent sinus tachycardia, CTa obtained which is negative for PE, does show possible superimposed bacterial pneumonia.  Ceftriaxone  broadened to Zosyn . Telemetry alarming for ST elevation, obtained EKG and reviewed with Dr. Mady of Cardiology, felt not to be STEMI, obtaining Troponin and Echo. 11/17/24:  No acute events overnight. Cr worsening at 1.41 this am. Remains on Zosyn . Awake and alert this am, transitioned to SBT but failed quickly. Became tachycardic and  hypertensive. Restarted sedation.  11/18/24: No acute overnight events. Remains critically ill requiring mechanical ventilation. Will perform another SBT today with precedex .  11/19/24 11/20/24: 11/21/24: 11/22/24: Remains critically ill requiring mechanical ventilation. On dilaudid  and propofol  gtt, 3mcg of levo. Worsening hypercapnia overnight requiring vecuronium  administration and versed .. Repeat ABG pending. 11/23/24:  on PCV, requiring heavy sedation. Diuresed with 40 mg Lasix  later in the day, Creatinine improved with diuresis and UOP 2.6 L (net +5.7L) 11/24/24: No acute events overnight.  Afebrile, requiring 2 mcg Levophed .  Remains on vent, currently 50% FiO2, no SBT today as pt with increased WOB on full support and sedation.  Will diurese with 40 mg Lasix  x1 dose.  11/09/2024: No acute events overnight.  Afebrile, off Levophed .  Remains on vent (50% FiO2), currently with increased WOB and assessory muscle use while on full vent support and sedation, will not perform SBT.  Good response to diuresis yesterday, Creatinine stable, UOP 2.4 L (net +6L), will give 40 mg IV Lasix  x1 dose today. Discussions with palliative care and family. Decision made to transition to comfort measures on 12/03/24 once family arrives.  Overnight the patient became significantly hypoxic & hypotensive and family was called bedside. The decision was made to transition the patient to comfort measures this evening and the patient passed away with his family bedside.  Pertinent Labs and Studies  Significant Diagnostic Studies DG Chest Port 1 View Result Date: 11/08/2024 EXAM: 1 VIEW(S) XRAY OF THE CHEST 12/02/2024 05:37:55 PM COMPARISON: 11/13/2024 CLINICAL HISTORY: Endotracheal tube present. FINDINGS: LINES, TUBES AND DEVICES: Endotracheal tube in place with tip 4.9 cm above the carina. Enteric tube in place coursing below the diaphragm with distal tip beyond the inferior margin of the film. LUNGS AND PLEURA: Left upper  lobe mass again noted. Chronic interstitial changes related to underlying emphysema but are appreciated on CT of 11/16/2024. Stable pulmonary hyperinflation. New bibasilar pulmonary infiltrates, multifocal infection versus aspiration. Trace bilateral pleural effusions. No pneumothorax. HEART AND MEDIASTINUM: Aortic atherosclerosis. No acute abnormality of the cardiac and mediastinal silhouettes. BONES AND SOFT TISSUES: No acute osseous abnormality. IMPRESSION: 1. Endotracheal tube tip 4.9 cm above the carina, and enteric tube coursing below the diaphragm with distal tip beyond the inferior margin of the film. 2. New bibasilar pulmonary infiltrates, multifocal infection versus aspiration. 3. Left upper lobe mass again noted. 4. Trace bilateral pleural effusions. 5. Stable pulmonary hyperinflation and chronic interstitial changes related to underlying emphysema. 6. Aortic atherosclerosis. Electronically signed by: Dorethia Molt MD 11/20/2024 06:11 PM EST RP Workstation: [id number]   DG Chest Port 1 View Result Date: 11/14/2024 CLINICAL DATA:  [id number] Acute respiratory failure with hypoxia and hypercarbia (HCC) [id number] EXAM: PORTABLE CHEST - 1 VIEW COMPARISON:  Multiple, most recently 11/23/2024, 11/22/2024 FINDINGS: Endotracheal tube terminates in the mid trachea. Esophagogastric tube courses below the diaphragm with the distal tip not included in the field of view. Similar changes of emphysema with diffuse interstitial opacities throughout the lungs, more so on the right than the left, otherwise unchanged. Subtle hazy attenuation of the lateral right mid lung. No pleural effusion or pneumothorax. Overall, similar appearance of the left upper lung zone masslike opacity. No cardiomegaly. IMPRESSION: 1. Increasing hazy airspace opacities along the lateral right mid lung, which may be artifactual from overlying soft tissue artifact. Alternatively, a developing bronchopneumonia could have  this appearance in the  correct clinical context. 2. Similarly positioned support tubes and lines, as delineated above. Electronically Signed   By: Rogelia Myers M.D.   On: 11/13/2024 09:04   DG Chest Port 1 View Result Date: 11/23/2024 CLINICAL DATA:  Intubation. EXAM: PORTABLE CHEST 1 VIEW COMPARISON:  Chest radiograph dated 11/22/2024. FINDINGS: Endotracheal tube approximately 5 cm above the carina. Enteric tube extends below diaphragm with tip beyond the inferior margin of the image. Background of emphysema and diffuse interstitial coarsening and reticulation similar to prior radiograph. No new consolidation. No pneumothorax. Destruction of the left fourth rib. IMPRESSION: 1. Endotracheal tube above the carina. 2. No significant interval change in the appearance of the lungs. Electronically Signed   By: Vanetta Chou M.D.   On: 11/23/2024 15:39   DG Chest Port 1 View Result Date: 11/22/2024 EXAM: 1 VIEW(S) XRAY OF THE CHEST 11/22/2024 08:42:00 PM COMPARISON: 11/21/2024 CLINICAL HISTORY: Encounter for intubation. FINDINGS: LINES, TUBES AND DEVICES: Endotracheal tube in place with tip 4.3 cm above the carina. Enteric tube extends below the diaphragm, but distal tip not included on the image. LUNGS AND PLEURA: Emphysema. Diffuse interstitial prominence. Patchy airspace opacities at the lung bases, similar to previous study. Left upper lobe spiculated mass again noted. No pleural effusion. No pneumothorax. HEART AND MEDIASTINUM: Aortic atherosclerosis. No acute abnormality of the cardiac and mediastinal silhouettes. BONES AND SOFT TISSUES: Destruction of the lateral left 4th rib again noted. IMPRESSION: 1. Endotracheal and enteric tubes appropriately positioned. 2. Left upper lobe spiculated mass, again noted. 3. Destruction of the lateral left 4th rib, again noted. 4. Patchy airspace opacities at the lung bases, similar to previous study. 5. Emphysema and diffuse interstitial prominence. Electronically signed by: Oneil Devonshire  MD 11/22/2024 08:50 PM EST RP Workstation: [id number]   DG Chest Port 1 View Result Date: 11/21/2024 EXAM: 1 VIEW(S) XRAY OF THE CHEST 11/21/2024 03:24:00 AM COMPARISON: Portable chest x-ray from yesterday at 8:23 am. CLINICAL HISTORY: [id number] Respiratory failure with hypoxia (HCC) [id number] Respiratory failure with hypoxia (HCC) FINDINGS: LINES, TUBES AND DEVICES: Endotracheal tube (ETT) tip is 6.7 cm from the carina. Enteric tube enters the stomach with the intragastric course out of view. LUNGS AND PLEURA: The lungs are emphysematous with chronic change. Patchy airspace disease continues to be seen in the lung bases. Spiculated mass laterally in the left upper lobe again noted. Minimal pleural effusions. No pneumothorax. HEART AND MEDIASTINUM: Aortic atherosclerosis. No acute abnormality of the cardiac and mediastinal silhouettes. BONES AND SOFT TISSUES: Adjacent destruction in the lateral left 4th rib underlies the left lung mass. IMPRESSION: 1. Spiculated mass laterally in the left upper lobe with adjacent destruction of the lateral left 4th rib. 2. Patchy airspace disease in the lung bases. Overall aeration unchanged. COPD. 3. Minimal pleural effusions. Electronically signed by: Francis Quam MD 11/21/2024 03:34 AM EST RP Workstation: [id number]   DG Chest Port 1 View Result Date: 11/20/2024 CLINICAL DATA:  Respiratory failure. EXAM: PORTABLE CHEST 1 VIEW COMPARISON:  11/19/2024 FINDINGS: Endotracheal tube tip is 3.4 cm above the base of the carina. The NG tube passes into the stomach although the distal tip position is not included on the film. Vascular congestion again noted. Patchy airspace disease remains in the left base. Right base is better aerated than on the prior study. Peripheral irregular left upper lung lesion is again noted. IMPRESSION: 1. Endotracheal tube tip is 3.4 cm above the base of the carina. 2. Persistent basilar airspace disease, left greater than  right. 3. Irregular peripheral left  upper lung lesion again noted. Electronically Signed   By: Camellia Candle M.D.   On: 11/20/2024 09:24   DG Chest Port 1 View Result Date: 11/19/2024 EXAM: 1 VIEW(S) XRAY OF THE CHEST 11/19/2024 10:32:36 PM COMPARISON: Portable chest and CTA chest dated 11/16/2024. CLINICAL HISTORY: Respiration abnormal. FINDINGS: LINES, TUBES AND DEVICES: Endotracheal tube tip is mid tracheal, 5 cm from the carina. Enteric tube enters the stomach, but the gastric course is out of view. An overlying monitor lead obscures a nearly 3 cm spiculated masslike abnormality in the lateral left upper lobe at the arch level, better demonstrated on CT where is it showed adjacent destruction in the lateral 4th rib . LUNGS AND PLEURA: Lungs are emphysematous. Patchy airspace disease is present in the right greater than the left lower lung fields, increased bilaterally since the prior study. Small pleural effusions have developed. No pneumothorax. HEART AND MEDIASTINUM: Aortic atherosclerosis and tortuosity are present. Mediastinal silhouette is stable. No acute abnormality of the cardiac silhouette. BONES AND SOFT TISSUES: Thoracic spondylosis. No acute osseous abnormality. IMPRESSION: 1. Patchy airspace disease in the right greater than left lower lungs, increased bilaterally since the prior study. 2. Small bilateral pleural effusions have developed. 3. Spiculated approximately 3 cm masslike abnormality in the lateral left upper lobe, today obscured by an overlying monitor lead as well as adjacent destructive changes in the lateral 4th rib . Electronically signed by: Francis Quam MD 11/19/2024 10:46 PM EST RP Workstation: [id number]   ECHOCARDIOGRAM COMPLETE Result Date: 11/16/2024    ECHOCARDIOGRAM REPORT   Patient Name:   Christian Fields Date of Exam: 11/16/2024 Medical Rec #:  [id number]      Height:       66.0 in Accession #:    [id number]     Weight:       167.1 lb Date of Birth:  13-Feb-1956      BSA:          1.853 m Patient Age:    68  years       BP:           98/67 mmHg Patient Gender: M              HR:           108 bpm. Exam Location:  [facility] Procedure: 2D Echo, Cardiac Doppler and Color Doppler (Both Spectral and Color            Flow Doppler were utilized during procedure). Indications:     Acutre respiratory distress R06.03  History:         Patient has prior history of Echocardiogram examinations, most                  recent 02/20/2024. Signs/Symptoms:Dyspnea.  Sonographer:     Ashley McNeely-Sloane Referring Phys:  [id number] Christian Fields Diagnosing Phys: Lonni Hanson MD  Sonographer Comments: Technically difficult study due to poor echo windows and echo performed with patient supine and on artificial respirator. IMPRESSIONS  1. Left ventricular ejection fraction, by estimation, is 60 to 65%. The left ventricle has normal function. The left ventricle has no regional wall motion abnormalities. There is mild left ventricular hypertrophy. Indeterminate diastolic filling due to E-A fusion.  2. Right ventricular systolic function is mildly reduced. The right ventricular size is normal. Mildly increased right ventricular wall thickness. There is severely elevated pulmonary artery systolic pressure.  3. The mitral valve is grossly normal. No  evidence of mitral valve regurgitation. No evidence of mitral stenosis.  4. The aortic valve has an indeterminant number of cusps. There is moderate thickening of the aortic valve. Aortic valve regurgitation is not visualized. Aortic valve sclerosis is present, with no evidence of aortic valve stenosis. FINDINGS  Left Ventricle: Left ventricular ejection fraction, by estimation, is 60 to 65%. The left ventricle has normal function. The left ventricle has no regional wall motion abnormalities. The left ventricular internal cavity size was normal in size. There is  mild left ventricular hypertrophy. Indeterminate diastolic filling due to E-A fusion. Right Ventricle: The right ventricular size is normal.  Mildly increased right ventricular wall thickness. Right ventricular systolic function is mildly reduced. There is severely elevated pulmonary artery systolic pressure. Left Atrium: Left atrial size was normal in size. Right Atrium: Right atrial size was normal in size. Pericardium: The pericardium was not well visualized. Presence of epicardial fat layer. Mitral Valve: The mitral valve is grossly normal. No evidence of mitral valve regurgitation. No evidence of mitral valve stenosis. Tricuspid Valve: The tricuspid valve is normal in structure. Tricuspid valve regurgitation is mild. Aortic Valve: The aortic valve has an indeterminant number of cusps. There is moderate thickening of the aortic valve. Aortic valve regurgitation is not visualized. Aortic valve sclerosis is present, with no evidence of aortic valve stenosis. Aortic valve mean gradient measures 5.5 mmHg. Aortic valve peak gradient measures 10.6 mmHg. Aortic valve area, by VTI measures 2.35 cm. Pulmonic Valve: The pulmonic valve was grossly normal. Pulmonic valve regurgitation is trivial. No evidence of pulmonic stenosis. Aorta: The aortic root is normal in size and structure. Pulmonary Artery: The pulmonary artery is of normal size. Venous: IVC assessment for right atrial pressure unable to be performed due to mechanical ventilation. IAS/Shunts: No atrial level shunt detected by color flow Doppler.  LEFT VENTRICLE PLAX 2D LVIDd:         4.20 cm     Diastology LVIDs:         3.40 cm     LV e' medial:  4.13 cm/s LV PW:         1.20 cm     LV e' lateral: 6.31 cm/s LV IVS:        1.20 cm LVOT diam:     2.10 cm LV SV:         53 LV SV Index:   29 LVOT Area:     3.46 cm  LV Volumes (MOD) LV vol d, MOD A2C: 53.8 ml LV vol d, MOD A4C: 22.4 ml LV vol s, MOD A2C: 20.7 ml LV vol s, MOD A4C: 10.4 ml LV SV MOD A2C:     33.1 ml LV SV MOD A4C:     22.4 ml LV SV MOD BP:      23.0 ml RIGHT VENTRICLE             IVC RV Basal diam:  4.00 cm     IVC diam: 1.90 cm RV Mid  diam:    3.70 cm RV S prime:     15.00 cm/s TAPSE (M-mode): 1.6 cm LEFT ATRIUM             Index        RIGHT ATRIUM           Index LA diam:        3.60 cm 1.94 cm/m   RA Area:     13.00 cm LA Vol (A2C):   37.4  ml 20.19 ml/m  RA Volume:   29.90 ml  16.14 ml/m LA Vol (A4C):   25.3 ml 13.66 ml/m LA Biplane Vol: 34.1 ml 18.40 ml/m  AORTIC VALVE                     PULMONIC VALVE AV Area (Vmax):    2.38 cm      PV Vmax:        1.01 m/s AV Area (Vmean):   2.29 cm      PV Vmean:       68.400 cm/s AV Area (VTI):     2.35 cm      PV VTI:         0.165 m AV Vmax:           163.00 cm/s   PV Peak grad:   4.1 mmHg AV Vmean:          108.500 cm/s  PV Mean grad:   2.0 mmHg AV VTI:            0.226 m       RVOT Peak grad: 2 mmHg AV Peak Grad:      10.6 mmHg AV Mean Grad:      5.5 mmHg LVOT Vmax:         112.00 cm/s LVOT Vmean:        71.600 cm/s LVOT VTI:          0.153 m LVOT/AV VTI ratio: 0.68  AORTA Ao Root diam: 3.60 cm TRICUSPID VALVE TR Peak grad:   65.2 mmHg TR Vmax:        403.88 cm/s  SHUNTS Systemic VTI:  0.15 m Systemic Diam: 2.10 cm Pulmonic VTI:  0.101 m Lonni Hanson MD Electronically signed by Lonni Hanson MD Signature Date/Time: 11/16/2024/4:27:02 PM    Final    US  Venous Img Lower Bilateral (DVT) Result Date: 11/16/2024 CLINICAL DATA:  69 year old male with bilateral lower extremity edema. EXAM: BILATERAL LOWER EXTREMITY VENOUS DOPPLER ULTRASOUND TECHNIQUE: Gray-scale sonography with graded compression, as well as color Doppler and duplex ultrasound were performed to evaluate the lower extremity deep venous systems from the level of the common femoral vein and including the common femoral, femoral, profunda femoral, popliteal and calf veins including the posterior tibial, peroneal and gastrocnemius veins when visible. The superficial great saphenous vein was also interrogated. Spectral Doppler was utilized to evaluate flow at rest and with distal augmentation maneuvers in the common femoral,  femoral and popliteal veins. COMPARISON:  02/20/2024 FINDINGS: RIGHT LOWER EXTREMITY Common Femoral Vein: No evidence of thrombus. Normal compressibility, respiratory phasicity and response to augmentation. Saphenofemoral Junction: No evidence of thrombus. Normal compressibility and flow on color Doppler imaging. Profunda Femoral Vein: No evidence of thrombus. Normal compressibility and flow on color Doppler imaging. Femoral Vein: No evidence of thrombus. Normal compressibility, respiratory phasicity and response to augmentation. Popliteal Vein: No evidence of thrombus. Normal compressibility, respiratory phasicity and response to augmentation. Calf Veins: No evidence of thrombus. Normal compressibility and flow on color Doppler imaging. Other Findings:  None. LEFT LOWER EXTREMITY Common Femoral Vein: No evidence of thrombus. Normal compressibility, respiratory phasicity and response to augmentation. Saphenofemoral Junction: No evidence of thrombus. Normal compressibility and flow on color Doppler imaging. Profunda Femoral Vein: No evidence of thrombus. Normal compressibility and flow on color Doppler imaging. Femoral Vein: No evidence of thrombus. Normal compressibility, respiratory phasicity and response to augmentation. Popliteal Vein: No evidence of thrombus. Normal compressibility, respiratory phasicity and response  to augmentation. Calf Veins: No evidence of thrombus. Normal compressibility and flow on color Doppler imaging. Other Findings:  None. IMPRESSION: No evidence of bilateral lower extremity deep venous thrombosis. Ester Sides, MD [facility] Electronically Signed   By: Ester Sides M.D.   On: 11/16/2024 11:55   CT Angio Chest Pulmonary Embolism (PE) W or WO Contrast Result Date: 11/16/2024 EXAM: CTA of the Chest with contrast for PE 11/16/2024 09:44:39 AM TECHNIQUE: CTA of the chest was performed after the administration of 75 mL of  iohexol  (OMNIPAQUE ) 350 MG/ML injection. Multiplanar reformatted images are provided for review. MIP images are provided for review. Automated exposure control, iterative reconstruction, and/or weight based adjustment of the mA/kV was utilized to reduce the radiation dose to as low as reasonably achievable. COMPARISON: 10/02/2024 CLINICAL HISTORY: Pulmonary embolism (PE) suspected, low to intermediate prob, neg D-dimer. FINDINGS: PULMONARY ARTERIES: Pulmonary arteries are adequately opacified for evaluation. No pulmonary embolism. Dilated central pulmonary arteries suggesting pulmonary arterial hypertension. MEDIASTINUM: The heart and pericardium demonstrate no acute abnormality. Scattered coronary calcifications. Moderate calcified aortic plaque in the arch and descending thoracic segment without aneurysm. Endotracheal tube placement to the mid trachea. LYMPH NODES: 1.5 cm enlarged precarinal node, as before. Stable right hilar adenopathy. No axillary lymphadenopathy. LUNGS AND PLEURA: Pulmonary emphysema. 2.6 cm pleural based spiculated mass-like consolidation laterally in the left upper lobe with adjacent rib involvement, stable since previous. New patchy areas of airspace consolidation predominantly in the posterior right lower lobe, also in the inferior right middle lobe , and posterior and lateral basal segments left lower lobe. No pleural effusion or pneumothorax. UPPER ABDOMEN: Limited images of the upper abdomen demonstrate a nasogastric tube to the partially decompressed stomach. SOFT TISSUES AND BONES: Spondylotic changes at the thoracolumbar junction. Old healed sternal fracture. Stable mild T5 vertebral compression deformity. IMPRESSION: 1. No evidence of pulmonary embolism. 2. New multifocal airspace consolidation greatest in the posterior right lower lobe with additional foci in the inferior right middle lobe and basal segments of the left lower lobe. 3. Stable 2.6 cm pleural-based mass-like  consolidation in the lateral left upper lobe with adjacent rib involvement. 4. Enlarged central pulmonary arteries suggesting pulmonary arterial hypertension. Electronically signed by: Dayne Hassell MD 11/16/2024 10:14 AM EST RP Workstation: [id number]   DG Abd 1 View Result Date: 11/16/2024 CLINICAL DATA:  OG tube placement. EXAM: ABDOMEN - 1 VIEW COMPARISON:  02/27/2021 FINDINGS: OG tube tip is in the distal stomach with proximal side port well below the GE junction. Despite OG tube placement there is moderate gaseous distention of the stomach. Bowel gas pattern in the visualized upper abdomen otherwise unremarkable. IMPRESSION: OG tube tip is in the distal stomach with proximal side port below the GE junction. Electronically Signed   By: Camellia Candle M.D.   On: 11/16/2024 06:40   DG CHEST PORT 1 VIEW Result Date: 11/16/2024 CLINICAL DATA:  Status post intubation. EXAM: PORTABLE CHEST 1 VIEW COMPARISON:  11/16/2024, earlier same day FINDINGS: Endotracheal tube tip is 6.9 cm above the base of the carina. The NG tube passes into the stomach although the distal tip position is not included on the film. Lungs are hyperexpanded. No substantial pleural effusion. Interstitial markings are diffusely coarsened with chronic features. Scarring noted in the left lung with scattered areas of architectural distortion bilaterally. Telemetry leads overlie the chest. IMPRESSION: 1. Endotracheal tube tip is 6.9 cm above the base of the carina. 2. Chronic interstitial changes with scarring  in the left lung. Electronically Signed   By: Camellia Candle M.D.   On: 11/16/2024 06:39   DG Chest Port 1 View Result Date: 11/16/2024 EXAM: 1 VIEW(S) XRAY OF THE CHEST 11/16/2024 05:42:00 AM COMPARISON: 11/15/2024 CLINICAL HISTORY: Hypoxia. FINDINGS: LUNGS AND PLEURA: Chronic interstitial markings, unchanged. Left upper lung scarring, unchanged. Emphysema. No pleural effusion. No pneumothorax. HEART AND MEDIASTINUM: Aortic  atherosclerosis. No acute abnormality of the cardiac silhouette. BONES AND SOFT TISSUES: No acute osseous abnormality. IMPRESSION: 1. No acute findings. 2. Chronic interstitial markings, left upper lung scarring, and emphysema, unchanged. Electronically signed by: Evalene Coho MD 11/16/2024 06:10 AM EST RP Workstation: [id number]   DG Chest Port 1 View Result Date: 11/15/2024 CLINICAL DATA:  Acute respiratory failure with hypoxia and hypercarbia. EXAM: PORTABLE CHEST 1 VIEW COMPARISON:  11/14/2024 and CT chest 09/17/2024. FINDINGS: Trachea is midline. Heart size stable. Thoracic aorta is calcified. Prominent bilateral pulmonary arteries. Post treatment scarring in the lateral left upper lobe. No airspace consolidation or pleural fluid. Lungs are emphysematous. IMPRESSION: 1. No acute findings. 2. Post treatment subpleural scarring in the left upper lobe. 3.  Aortic atherosclerosis (ICD10-I70.0). 4.  Emphysema (ICD10-J43.9). Electronically Signed   By: Newell Eke M.D.   On: 11/15/2024 13:06   DG Chest Port 1 View Result Date: 11/14/2024 EXAM: 1 VIEW(S) XRAY OF THE CHEST 11/14/2024 05:07:00 PM COMPARISON: 11/12/2024 CLINICAL HISTORY: Respiratory distress FINDINGS: LUNGS AND PLEURA: Chronically coarsened interstitial markings. Left upper lobe spiculated nodule, unchanged, better assessed on chest CT of 09/17/2024. Left upper lobe chronic scarring. Emphysema. No pleural effusion. No pneumothorax. HEART AND MEDIASTINUM: Aortic atherosclerosis. No acute abnormality of the cardiac and mediastinal silhouettes. BONES AND SOFT TISSUES: No acute osseous abnormality. IMPRESSION: 1. No acute cardiopulmonary process. 2. Left upper lobe spiculated pulmonary nodule, unchanged; recommend continued CT surveillance as per prior planning. 3. Emphysema. Electronically signed by: Dorethia Molt MD 11/14/2024 08:19 PM EST RP Workstation: [id number]   DG Chest Portable 1 View Result Date: 11/12/2024 CLINICAL DATA:   Shortness of breath.  COPD. EXAM: PORTABLE CHEST 1 VIEW COMPARISON:  Radiograph 11/03/2024.  CT 09/17/2024 FINDINGS: Chronic hyperinflation and interstitial coarsening. Spiculated left upper lobe pulmonary nodule, unchanged. Scattered parenchymal scarring, no acute airspace disease. Normal heart size with stable mediastinal contours. No significant pleural effusion. No pneumothorax. IMPRESSION: 1. Chronic hyperinflation and interstitial coarsening. No acute airspace disease. 2. Spiculated left upper lobe pulmonary nodule, unchanged. Electronically Signed   By: Andrea Gasman M.D.   On: 11/12/2024 14:44   DG Chest Portable 1 View Result Date: 11/03/2024 CLINICAL DATA:  SOB EXAM: PORTABLE CHEST - 1 VIEW COMPARISON:  July 15, 2024 FINDINGS: Emphysema. Hazy airspace opacities in the right mid and lower lung zones. No pleural effusion or pneumothorax. Unchanged small subpleural nodule in the left upper lung zone measuring 1.6 cm. No cardiomegaly. Tortuous aorta with aortic atherosclerosis. No acute fracture or destructive lesions. Multilevel thoracic osteophytosis. IMPRESSION: Emphysema. Hazy airspace opacities in the right mid and lower lung zones, worrisome for bronchopneumonia. Electronically Signed   By: Rogelia Myers M.D.   On: 11/03/2024 19:03    Microbiology Recent Results (from the past 240 hours)  Culture, Respiratory w Gram Stain     Status: None   Collection Time: 11/16/24  7:21 AM   Specimen: Tracheal Aspirate; Respiratory  Result Value Ref Range Status   Specimen Description   Final    TRACHEAL ASPIRATE Performed at [facility], [address], [place], [place] [address]  Special Requests   Final    NONE Performed at [facility], [address] [place], [place] [address]    Gram Stain   Final    FEW WBC PRESENT, PREDOMINANTLY PMN RARE GRAM POSITIVE COCCI IN PAIRS Performed at [facility], [address] [place], [place] [address]    Culture FEW  ENTEROCOCCUS FAECALIS FEW CANDIDA GLABRATA   Final   Report Status 11/19/2024 FINAL  Final   Organism ID, Bacteria ENTEROCOCCUS FAECALIS  Final      Susceptibility   Enterococcus faecalis - MIC*    AMPICILLIN <=2 SENSITIVE Sensitive     VANCOMYCIN  1 SENSITIVE Sensitive     GENTAMICIN  SYNERGY RESISTANT Resistant     * FEW ENTEROCOCCUS FAECALIS  Culture, blood (Routine X 2) w Reflex to ID Panel     Status: None   Collection Time: 11/20/24 11:02 AM   Specimen: BLOOD  Result Value Ref Range Status   Specimen Description BLOOD BLOOD RIGHT HAND  Final   Special Requests   Final    BOTTLES DRAWN AEROBIC AND ANAEROBIC Blood Culture adequate volume   Culture   Final    NO GROWTH 5 DAYS Performed at [facility], [address], [place], [place] [address]    Report Status 2024/12/16 FINAL  Final  Culture, blood (Routine X 2) w Reflex to ID Panel     Status: None   Collection Time: 11/20/24 11:11 AM   Specimen: BLOOD  Result Value Ref Range Status   Specimen Description BLOOD BLOOD RIGHT HAND  Final   Special Requests   Final    BOTTLES DRAWN AEROBIC AND ANAEROBIC Blood Culture adequate volume   Culture   Final    NO GROWTH 5 DAYS Performed at [facility], [address], [place], [place] [address]    Report Status 2024-12-16 FINAL  Final  Culture, Respiratory w Gram Stain     Status: None   Collection Time: 11/20/24 11:11 AM   Specimen: Tracheal Aspirate; Respiratory  Result Value Ref Range Status   Specimen Description   Final    TRACHEAL ASPIRATE Performed at [facility], [address] [place], [place] [address]    Special Requests   Final    Immunocompromised Performed at [facility], [address] [place], [place] [address]    Gram Stain   Final    MODERATE WBC PRESENT, PREDOMINANTLY PMN RARE SQUAMOUS EPITHELIAL CELLS PRESENT FEW YEAST RARE GRAM POSITIVE COCCI Performed at [facility], [address]  [place], [place] [address]    Culture FEW CANDIDA ALBICANS  Final   Report Status 11/22/2024 FINAL  Final    Lab Basic Metabolic Panel: Recent Labs  Lab 11/19/24 0336 11/20/24 0331 11/21/24 0315 11/22/24 0332 11/23/24 0352 11/24/24 0336 Dec 16, 2024 0339  NA 140   < > 144 146* 143 141 139  K 4.5   < > 4.2 4.1 3.6 3.3* 4.6  CL 100   < > 100 103 100 97* 97*  CO2 32   < > 33* 34* 34* 35* 34*  GLUCOSE 201*   < > 167* 195* 189* 276* 133*  BUN 59*   < > 60* 69* 82* 86* 74*  CREATININE 1.28*   < > 1.27* 1.42* 1.26* 1.12 1.05  CALCIUM  8.8*   < > 9.1 9.5 9.4 9.3 8.9  MG 2.2   < > 2.3 2.4 2.3 2.1 2.1  PHOS 3.0  --   --   --   --  3.1 3.4   < > = values in this interval not displayed.   Liver Function Tests: Recent Labs  Lab 11/13/2024 0339  ALBUMIN  2.9*   No results for input(s): LIPASE, AMYLASE in the last 168 hours. No results for input(s): AMMONIA in the last 168 hours. CBC: Recent Labs  Lab 11/21/24 0315 11/22/24 0332 11/23/24 0352 11/24/24 0336 11/08/2024 0339  WBC 27.3* 22.0* 22.3* 15.3* 14.1*  HGB 11.2* 10.3* 9.1* 9.1* 8.4*  HCT 39.8 36.1* 31.6* 30.4* 27.6*  MCV 100.5* 100.6* 99.1 96.5 94.2  PLT 187 147* 148* 150 151   Cardiac Enzymes: No results for input(s): CKTOTAL, CKMB, CKMBINDEX, TROPONINI in the last 168 hours. Sepsis Labs: Recent Labs  Lab 11/22/24 0332 11/23/24 0352 11/24/24 0336 11/27/2024 0339  WBC 22.0* 22.3* 15.3* 14.1*    Procedures/Operations  11/16/24: ETT placement 11/21/24: Arterial line placement   Jenita Ruth Rust-Chester, AGACNP-BC Acute Care Nurse Practitioner  Pulmonary & Critical Care   [phone] / [phone] Please see Amion for details.    "

## 2024-12-09 DEATH — deceased

## 2025-01-04 ENCOUNTER — Inpatient Hospital Stay

## 2025-02-04 ENCOUNTER — Ambulatory Visit (INDEPENDENT_AMBULATORY_CARE_PROVIDER_SITE_OTHER): Payer: 59 | Admitting: Nurse Practitioner

## 2025-02-04 ENCOUNTER — Other Ambulatory Visit (INDEPENDENT_AMBULATORY_CARE_PROVIDER_SITE_OTHER): Payer: 59

## 2025-02-04 ENCOUNTER — Encounter (INDEPENDENT_AMBULATORY_CARE_PROVIDER_SITE_OTHER): Payer: 59

## 2025-04-01 ENCOUNTER — Other Ambulatory Visit

## 2025-04-08 ENCOUNTER — Inpatient Hospital Stay

## 2025-04-08 ENCOUNTER — Inpatient Hospital Stay: Admitting: Oncology
